# Patient Record
Sex: Female | Born: 1955 | Race: Black or African American | Hispanic: No | Smoking: Never smoker
Health system: Southern US, Community
[De-identification: ages and names within clinical notes are randomized; demographics above are authoritative.]

## PROBLEM LIST (undated history)

## (undated) DIAGNOSIS — M549 Dorsalgia, unspecified: Secondary | ICD-10-CM

## (undated) DIAGNOSIS — K219 Gastro-esophageal reflux disease without esophagitis: Secondary | ICD-10-CM

## (undated) DIAGNOSIS — I669 Occlusion and stenosis of unspecified cerebral artery: Secondary | ICD-10-CM

## (undated) DIAGNOSIS — I639 Cerebral infarction, unspecified: Secondary | ICD-10-CM

## (undated) DIAGNOSIS — N186 End stage renal disease: Secondary | ICD-10-CM

## (undated) DIAGNOSIS — Z992 Dependence on renal dialysis: Secondary | ICD-10-CM

## (undated) DIAGNOSIS — T4145XA Adverse effect of unspecified anesthetic, initial encounter: Secondary | ICD-10-CM

## (undated) DIAGNOSIS — Z9049 Acquired absence of other specified parts of digestive tract: Secondary | ICD-10-CM

## (undated) DIAGNOSIS — I359 Nonrheumatic aortic valve disorder, unspecified: Secondary | ICD-10-CM

## (undated) DIAGNOSIS — M79601 Pain in right arm: Secondary | ICD-10-CM

## (undated) DIAGNOSIS — M109 Gout, unspecified: Secondary | ICD-10-CM

## (undated) DIAGNOSIS — T8859XA Other complications of anesthesia, initial encounter: Secondary | ICD-10-CM

## (undated) DIAGNOSIS — Z9289 Personal history of other medical treatment: Secondary | ICD-10-CM

## (undated) DIAGNOSIS — D649 Anemia, unspecified: Secondary | ICD-10-CM

## (undated) DIAGNOSIS — M1990 Unspecified osteoarthritis, unspecified site: Secondary | ICD-10-CM

## (undated) DIAGNOSIS — Z952 Presence of prosthetic heart valve: Secondary | ICD-10-CM

## (undated) DIAGNOSIS — N289 Disorder of kidney and ureter, unspecified: Secondary | ICD-10-CM

## (undated) DIAGNOSIS — G459 Transient cerebral ischemic attack, unspecified: Secondary | ICD-10-CM

## (undated) DIAGNOSIS — K8020 Calculus of gallbladder without cholecystitis without obstruction: Secondary | ICD-10-CM

## (undated) DIAGNOSIS — I38 Endocarditis, valve unspecified: Secondary | ICD-10-CM

## (undated) DIAGNOSIS — M542 Cervicalgia: Secondary | ICD-10-CM

## (undated) DIAGNOSIS — R102 Pelvic and perineal pain: Secondary | ICD-10-CM

## (undated) DIAGNOSIS — G5600 Carpal tunnel syndrome, unspecified upper limb: Secondary | ICD-10-CM

## (undated) DIAGNOSIS — R6521 Severe sepsis with septic shock: Secondary | ICD-10-CM

## (undated) DIAGNOSIS — R9389 Abnormal findings on diagnostic imaging of other specified body structures: Secondary | ICD-10-CM

## (undated) DIAGNOSIS — R7881 Bacteremia: Secondary | ICD-10-CM

## (undated) DIAGNOSIS — G8929 Other chronic pain: Secondary | ICD-10-CM

## (undated) DIAGNOSIS — M810 Age-related osteoporosis without current pathological fracture: Secondary | ICD-10-CM

## (undated) DIAGNOSIS — M7989 Other specified soft tissue disorders: Secondary | ICD-10-CM

## (undated) DIAGNOSIS — B957 Other staphylococcus as the cause of diseases classified elsewhere: Secondary | ICD-10-CM

## (undated) DIAGNOSIS — I272 Pulmonary hypertension, unspecified: Secondary | ICD-10-CM

## (undated) DIAGNOSIS — A419 Sepsis, unspecified organism: Secondary | ICD-10-CM

## (undated) DIAGNOSIS — B9561 Methicillin susceptible Staphylococcus aureus infection as the cause of diseases classified elsewhere: Secondary | ICD-10-CM

## (undated) DIAGNOSIS — T829XXA Unspecified complication of cardiac and vascular prosthetic device, implant and graft, initial encounter: Secondary | ICD-10-CM

## (undated) HISTORY — PX: COLONOSCOPY W/ POLYPECTOMY: SHX1380

## (undated) HISTORY — PX: CARDIAC VALVE REPLACEMENT: SHX585

## (undated) HISTORY — DX: Other specified soft tissue disorders: M79.89

## (undated) HISTORY — DX: Abnormal findings on diagnostic imaging of other specified body structures: R93.89

## (undated) HISTORY — DX: Presence of prosthetic heart valve: Z95.2

## (undated) HISTORY — DX: Gastro-esophageal reflux disease without esophagitis: K21.9

## (undated) HISTORY — PX: DG AV DIALYSIS GRAFT DECLOT OR: HXRAD813

## (undated) HISTORY — DX: Unspecified complication of cardiac and vascular prosthetic device, implant and graft, initial encounter: T82.9XXA

## (undated) HISTORY — DX: Nonrheumatic aortic valve disorder, unspecified: I35.9

## (undated) HISTORY — DX: Pulmonary hypertension, unspecified: I27.20

## (undated) HISTORY — DX: Pain in right arm: M79.601

## (undated) HISTORY — DX: Bacteremia: R78.81

## (undated) HISTORY — DX: Other staphylococcus as the cause of diseases classified elsewhere: B95.7

## (undated) HISTORY — DX: Cerebral infarction, unspecified: I63.9

## (undated) HISTORY — DX: Occlusion and stenosis of unspecified cerebral artery: I66.9

## (undated) HISTORY — DX: Cervicalgia: M54.2

## (undated) HISTORY — DX: Dorsalgia, unspecified: M54.9

## (undated) HISTORY — DX: Calculus of gallbladder without cholecystitis without obstruction: K80.20

## (undated) HISTORY — DX: Other chronic pain: G89.29

## (undated) HISTORY — DX: Pelvic and perineal pain: R10.2

## (undated) HISTORY — DX: Endocarditis, valve unspecified: I38

## (undated) HISTORY — DX: Age-related osteoporosis without current pathological fracture: M81.0

## (undated) HISTORY — DX: Acquired absence of other specified parts of digestive tract: Z90.49

---

## 1981-02-26 HISTORY — PX: TUBAL LIGATION: SHX77

## 1997-05-27 ENCOUNTER — Encounter (HOSPITAL_COMMUNITY): Admission: RE | Admit: 1997-05-27 | Discharge: 1997-08-25 | Payer: Self-pay | Admitting: Nephrology

## 1997-08-31 ENCOUNTER — Encounter (HOSPITAL_COMMUNITY): Admission: RE | Admit: 1997-08-31 | Discharge: 1997-11-29 | Payer: Self-pay | Admitting: Nephrology

## 1997-09-01 ENCOUNTER — Other Ambulatory Visit: Admission: RE | Admit: 1997-09-01 | Discharge: 1997-09-01 | Payer: Self-pay | Admitting: Nephrology

## 1997-09-10 ENCOUNTER — Emergency Department (HOSPITAL_COMMUNITY): Admission: EM | Admit: 1997-09-10 | Discharge: 1997-09-11 | Payer: Self-pay | Admitting: Emergency Medicine

## 1997-11-26 ENCOUNTER — Encounter (HOSPITAL_COMMUNITY): Admission: RE | Admit: 1997-11-26 | Discharge: 1998-02-24 | Payer: Self-pay | Admitting: Nephrology

## 1998-05-02 ENCOUNTER — Encounter (HOSPITAL_COMMUNITY): Admission: RE | Admit: 1998-05-02 | Discharge: 1998-07-31 | Payer: Self-pay | Admitting: Nephrology

## 1998-08-11 ENCOUNTER — Encounter (HOSPITAL_COMMUNITY): Admission: RE | Admit: 1998-08-11 | Discharge: 1998-11-09 | Payer: Self-pay | Admitting: Nephrology

## 1998-11-11 ENCOUNTER — Encounter (HOSPITAL_COMMUNITY): Admission: RE | Admit: 1998-11-11 | Discharge: 1999-02-09 | Payer: Self-pay | Admitting: Nephrology

## 1999-01-11 ENCOUNTER — Ambulatory Visit: Admission: RE | Admit: 1999-01-11 | Discharge: 1999-01-11 | Payer: Self-pay | Admitting: Vascular Surgery

## 1999-01-11 ENCOUNTER — Encounter: Payer: Self-pay | Admitting: Vascular Surgery

## 1999-02-16 ENCOUNTER — Ambulatory Visit (HOSPITAL_COMMUNITY): Admission: RE | Admit: 1999-02-16 | Discharge: 1999-02-16 | Payer: Self-pay | Admitting: Thoracic Surgery

## 1999-02-22 ENCOUNTER — Ambulatory Visit (HOSPITAL_COMMUNITY): Admission: RE | Admit: 1999-02-22 | Discharge: 1999-02-22 | Payer: Self-pay | Admitting: Vascular Surgery

## 1999-03-06 ENCOUNTER — Ambulatory Visit (HOSPITAL_COMMUNITY): Admission: RE | Admit: 1999-03-06 | Discharge: 1999-03-06 | Payer: Self-pay | Admitting: Nephrology

## 1999-03-06 ENCOUNTER — Encounter: Payer: Self-pay | Admitting: Nephrology

## 1999-03-22 ENCOUNTER — Ambulatory Visit (HOSPITAL_COMMUNITY): Admission: RE | Admit: 1999-03-22 | Discharge: 1999-03-22 | Payer: Self-pay | Admitting: Nephrology

## 1999-05-08 ENCOUNTER — Ambulatory Visit (HOSPITAL_COMMUNITY): Admission: RE | Admit: 1999-05-08 | Discharge: 1999-05-08 | Payer: Self-pay | Admitting: Nephrology

## 1999-05-13 ENCOUNTER — Encounter: Payer: Self-pay | Admitting: Vascular Surgery

## 1999-05-13 ENCOUNTER — Ambulatory Visit (HOSPITAL_COMMUNITY): Admission: RE | Admit: 1999-05-13 | Discharge: 1999-05-13 | Payer: Self-pay | Admitting: Vascular Surgery

## 1999-06-14 ENCOUNTER — Ambulatory Visit (HOSPITAL_COMMUNITY): Admission: RE | Admit: 1999-06-14 | Discharge: 1999-06-14 | Payer: Self-pay | Admitting: *Deleted

## 1999-06-23 ENCOUNTER — Ambulatory Visit (HOSPITAL_COMMUNITY): Admission: RE | Admit: 1999-06-23 | Discharge: 1999-06-23 | Payer: Self-pay | Admitting: *Deleted

## 1999-07-04 ENCOUNTER — Encounter: Payer: Self-pay | Admitting: Vascular Surgery

## 1999-07-04 ENCOUNTER — Ambulatory Visit (HOSPITAL_COMMUNITY): Admission: RE | Admit: 1999-07-04 | Discharge: 1999-07-04 | Payer: Self-pay | Admitting: Vascular Surgery

## 1999-07-11 ENCOUNTER — Other Ambulatory Visit: Admission: RE | Admit: 1999-07-11 | Discharge: 1999-07-11 | Payer: Self-pay | Admitting: Obstetrics

## 1999-07-11 ENCOUNTER — Encounter: Admission: RE | Admit: 1999-07-11 | Discharge: 1999-07-11 | Payer: Self-pay | Admitting: Obstetrics & Gynecology

## 1999-07-19 ENCOUNTER — Ambulatory Visit (HOSPITAL_COMMUNITY): Admission: RE | Admit: 1999-07-19 | Discharge: 1999-07-19 | Payer: Self-pay | Admitting: Obstetrics

## 1999-08-01 ENCOUNTER — Encounter: Admission: RE | Admit: 1999-08-01 | Discharge: 1999-08-01 | Payer: Self-pay | Admitting: Obstetrics

## 2000-04-09 ENCOUNTER — Encounter: Admission: RE | Admit: 2000-04-09 | Discharge: 2000-04-09 | Payer: Self-pay | Admitting: *Deleted

## 2000-04-16 ENCOUNTER — Ambulatory Visit (HOSPITAL_COMMUNITY): Admission: RE | Admit: 2000-04-16 | Discharge: 2000-04-16 | Payer: Self-pay | Admitting: *Deleted

## 2000-08-02 ENCOUNTER — Encounter: Payer: Self-pay | Admitting: Nephrology

## 2000-08-02 ENCOUNTER — Encounter: Admission: RE | Admit: 2000-08-02 | Discharge: 2000-08-02 | Payer: Self-pay | Admitting: Nephrology

## 2000-08-14 ENCOUNTER — Ambulatory Visit: Admission: RE | Admit: 2000-08-14 | Discharge: 2000-08-14 | Payer: Self-pay | Admitting: Nephrology

## 2000-08-14 ENCOUNTER — Encounter: Payer: Self-pay | Admitting: Nephrology

## 2001-01-10 ENCOUNTER — Ambulatory Visit (HOSPITAL_COMMUNITY): Admission: RE | Admit: 2001-01-10 | Discharge: 2001-01-10 | Payer: Self-pay | Admitting: Vascular Surgery

## 2001-01-10 ENCOUNTER — Encounter: Payer: Self-pay | Admitting: Vascular Surgery

## 2001-02-14 ENCOUNTER — Ambulatory Visit (HOSPITAL_COMMUNITY): Admission: RE | Admit: 2001-02-14 | Discharge: 2001-02-14 | Payer: Self-pay | Admitting: Vascular Surgery

## 2001-04-29 ENCOUNTER — Encounter: Payer: Self-pay | Admitting: *Deleted

## 2001-04-29 ENCOUNTER — Ambulatory Visit (HOSPITAL_COMMUNITY): Admission: RE | Admit: 2001-04-29 | Discharge: 2001-04-29 | Payer: Self-pay | Admitting: *Deleted

## 2001-08-06 ENCOUNTER — Ambulatory Visit (HOSPITAL_COMMUNITY): Admission: RE | Admit: 2001-08-06 | Discharge: 2001-08-06 | Payer: Self-pay | Admitting: *Deleted

## 2001-11-03 ENCOUNTER — Ambulatory Visit (HOSPITAL_COMMUNITY): Admission: RE | Admit: 2001-11-03 | Discharge: 2001-11-03 | Payer: Self-pay | Admitting: Vascular Surgery

## 2001-12-02 ENCOUNTER — Encounter: Payer: Self-pay | Admitting: Nephrology

## 2001-12-02 ENCOUNTER — Encounter: Admission: RE | Admit: 2001-12-02 | Discharge: 2001-12-02 | Payer: Self-pay | Admitting: Nephrology

## 2001-12-18 DIAGNOSIS — D631 Anemia in chronic kidney disease: Secondary | ICD-10-CM | POA: Insufficient documentation

## 2002-02-22 ENCOUNTER — Emergency Department (HOSPITAL_COMMUNITY): Admission: EM | Admit: 2002-02-22 | Discharge: 2002-02-22 | Payer: Self-pay | Admitting: Emergency Medicine

## 2002-02-22 ENCOUNTER — Encounter: Payer: Self-pay | Admitting: Emergency Medicine

## 2002-03-18 ENCOUNTER — Encounter: Payer: Self-pay | Admitting: Nephrology

## 2002-03-18 ENCOUNTER — Ambulatory Visit (HOSPITAL_COMMUNITY): Admission: RE | Admit: 2002-03-18 | Discharge: 2002-03-18 | Payer: Self-pay | Admitting: Nephrology

## 2002-04-03 ENCOUNTER — Ambulatory Visit (HOSPITAL_COMMUNITY): Admission: RE | Admit: 2002-04-03 | Discharge: 2002-04-03 | Payer: Self-pay | Admitting: *Deleted

## 2002-04-03 ENCOUNTER — Encounter: Payer: Self-pay | Admitting: *Deleted

## 2002-05-01 ENCOUNTER — Ambulatory Visit (HOSPITAL_COMMUNITY): Admission: RE | Admit: 2002-05-01 | Discharge: 2002-05-01 | Payer: Self-pay | Admitting: Vascular Surgery

## 2002-05-01 ENCOUNTER — Encounter: Payer: Self-pay | Admitting: Vascular Surgery

## 2002-06-10 ENCOUNTER — Ambulatory Visit (HOSPITAL_COMMUNITY): Admission: RE | Admit: 2002-06-10 | Discharge: 2002-06-10 | Payer: Self-pay | Admitting: Vascular Surgery

## 2002-10-14 ENCOUNTER — Ambulatory Visit (HOSPITAL_COMMUNITY): Admission: RE | Admit: 2002-10-14 | Discharge: 2002-10-14 | Payer: Self-pay | Admitting: Nephrology

## 2002-12-28 ENCOUNTER — Ambulatory Visit (HOSPITAL_COMMUNITY): Admission: RE | Admit: 2002-12-28 | Discharge: 2002-12-28 | Payer: Self-pay | Admitting: *Deleted

## 2003-05-20 ENCOUNTER — Emergency Department (HOSPITAL_COMMUNITY): Admission: EM | Admit: 2003-05-20 | Discharge: 2003-05-20 | Payer: Self-pay | Admitting: Emergency Medicine

## 2003-07-01 ENCOUNTER — Encounter (INDEPENDENT_AMBULATORY_CARE_PROVIDER_SITE_OTHER): Payer: Self-pay | Admitting: *Deleted

## 2003-07-01 LAB — CONVERTED CEMR LAB

## 2003-07-08 ENCOUNTER — Encounter: Admission: RE | Admit: 2003-07-08 | Discharge: 2003-07-08 | Payer: Self-pay | Admitting: Sports Medicine

## 2003-07-08 ENCOUNTER — Encounter (INDEPENDENT_AMBULATORY_CARE_PROVIDER_SITE_OTHER): Payer: Self-pay | Admitting: Specialist

## 2003-07-12 ENCOUNTER — Ambulatory Visit (HOSPITAL_COMMUNITY): Admission: RE | Admit: 2003-07-12 | Discharge: 2003-07-12 | Payer: Self-pay | Admitting: Family Medicine

## 2003-09-02 ENCOUNTER — Emergency Department (HOSPITAL_COMMUNITY): Admission: EM | Admit: 2003-09-02 | Discharge: 2003-09-02 | Payer: Self-pay | Admitting: Emergency Medicine

## 2003-11-03 ENCOUNTER — Encounter: Admission: RE | Admit: 2003-11-03 | Discharge: 2003-11-03 | Payer: Self-pay | Admitting: Nephrology

## 2003-11-17 ENCOUNTER — Encounter: Admission: RE | Admit: 2003-11-17 | Discharge: 2003-11-17 | Payer: Self-pay | Admitting: Nephrology

## 2003-11-19 ENCOUNTER — Encounter: Admission: RE | Admit: 2003-11-19 | Discharge: 2003-11-19 | Payer: Self-pay | Admitting: Nephrology

## 2003-11-20 ENCOUNTER — Ambulatory Visit (HOSPITAL_COMMUNITY): Admission: AD | Admit: 2003-11-20 | Discharge: 2003-11-20 | Payer: Self-pay | Admitting: Vascular Surgery

## 2004-01-26 ENCOUNTER — Ambulatory Visit (HOSPITAL_COMMUNITY): Admission: RE | Admit: 2004-01-26 | Discharge: 2004-01-26 | Payer: Self-pay | Admitting: *Deleted

## 2004-01-26 ENCOUNTER — Encounter (INDEPENDENT_AMBULATORY_CARE_PROVIDER_SITE_OTHER): Payer: Self-pay | Admitting: *Deleted

## 2004-02-04 ENCOUNTER — Ambulatory Visit (HOSPITAL_COMMUNITY): Admission: RE | Admit: 2004-02-04 | Discharge: 2004-02-04 | Payer: Self-pay | Admitting: Vascular Surgery

## 2004-06-09 ENCOUNTER — Ambulatory Visit (HOSPITAL_COMMUNITY): Admission: RE | Admit: 2004-06-09 | Discharge: 2004-06-09 | Payer: Self-pay | Admitting: Nephrology

## 2004-08-18 ENCOUNTER — Ambulatory Visit (HOSPITAL_COMMUNITY): Admission: RE | Admit: 2004-08-18 | Discharge: 2004-08-18 | Payer: Self-pay | Admitting: Internal Medicine

## 2004-09-25 ENCOUNTER — Ambulatory Visit (HOSPITAL_COMMUNITY): Admission: RE | Admit: 2004-09-25 | Discharge: 2004-09-25 | Payer: Self-pay | Admitting: Vascular Surgery

## 2004-09-26 ENCOUNTER — Ambulatory Visit (HOSPITAL_COMMUNITY): Admission: RE | Admit: 2004-09-26 | Discharge: 2004-09-26 | Payer: Self-pay | Admitting: *Deleted

## 2004-10-23 ENCOUNTER — Ambulatory Visit (HOSPITAL_COMMUNITY): Admission: RE | Admit: 2004-10-23 | Discharge: 2004-10-23 | Payer: Self-pay | Admitting: Vascular Surgery

## 2004-11-27 ENCOUNTER — Ambulatory Visit (HOSPITAL_COMMUNITY): Admission: RE | Admit: 2004-11-27 | Discharge: 2004-11-27 | Payer: Self-pay | Admitting: Vascular Surgery

## 2004-11-28 ENCOUNTER — Inpatient Hospital Stay (HOSPITAL_COMMUNITY): Admission: EM | Admit: 2004-11-28 | Discharge: 2004-12-02 | Payer: Self-pay | Admitting: Emergency Medicine

## 2004-12-26 ENCOUNTER — Emergency Department (HOSPITAL_COMMUNITY): Admission: EM | Admit: 2004-12-26 | Discharge: 2004-12-26 | Payer: Self-pay | Admitting: Emergency Medicine

## 2005-02-07 ENCOUNTER — Ambulatory Visit: Admission: RE | Admit: 2005-02-07 | Discharge: 2005-02-07 | Payer: Self-pay | Admitting: Vascular Surgery

## 2005-04-02 ENCOUNTER — Ambulatory Visit (HOSPITAL_COMMUNITY): Admission: RE | Admit: 2005-04-02 | Discharge: 2005-04-02 | Payer: Self-pay | Admitting: Vascular Surgery

## 2005-04-04 ENCOUNTER — Ambulatory Visit (HOSPITAL_COMMUNITY): Admission: RE | Admit: 2005-04-04 | Discharge: 2005-04-04 | Payer: Self-pay | Admitting: Vascular Surgery

## 2005-04-25 ENCOUNTER — Ambulatory Visit (HOSPITAL_COMMUNITY): Admission: RE | Admit: 2005-04-25 | Discharge: 2005-04-25 | Payer: Self-pay | Admitting: *Deleted

## 2005-05-07 ENCOUNTER — Ambulatory Visit (HOSPITAL_COMMUNITY): Admission: RE | Admit: 2005-05-07 | Discharge: 2005-05-07 | Payer: Self-pay | Admitting: Nephrology

## 2005-05-17 ENCOUNTER — Ambulatory Visit (HOSPITAL_COMMUNITY): Admission: RE | Admit: 2005-05-17 | Discharge: 2005-05-17 | Payer: Self-pay | Admitting: Vascular Surgery

## 2005-06-07 ENCOUNTER — Ambulatory Visit (HOSPITAL_COMMUNITY): Admission: RE | Admit: 2005-06-07 | Discharge: 2005-06-07 | Payer: Self-pay | Admitting: Critical Care Medicine

## 2005-08-06 ENCOUNTER — Ambulatory Visit (HOSPITAL_COMMUNITY): Admission: RE | Admit: 2005-08-06 | Discharge: 2005-08-06 | Payer: Self-pay | Admitting: Nephrology

## 2005-08-23 ENCOUNTER — Encounter: Admission: RE | Admit: 2005-08-23 | Discharge: 2005-08-23 | Payer: Self-pay | Admitting: Nephrology

## 2005-09-05 ENCOUNTER — Encounter: Admission: RE | Admit: 2005-09-05 | Discharge: 2005-10-03 | Payer: Self-pay | Admitting: Nephrology

## 2006-03-07 ENCOUNTER — Encounter: Admission: RE | Admit: 2006-03-07 | Discharge: 2006-03-07 | Payer: Self-pay | Admitting: Nephrology

## 2006-03-27 ENCOUNTER — Inpatient Hospital Stay (HOSPITAL_COMMUNITY): Admission: RE | Admit: 2006-03-27 | Discharge: 2006-03-28 | Payer: Self-pay | Admitting: Vascular Surgery

## 2006-04-10 ENCOUNTER — Ambulatory Visit: Payer: Self-pay | Admitting: Vascular Surgery

## 2006-04-25 DIAGNOSIS — N039 Chronic nephritic syndrome with unspecified morphologic changes: Secondary | ICD-10-CM | POA: Insufficient documentation

## 2006-04-25 DIAGNOSIS — N19 Unspecified kidney failure: Secondary | ICD-10-CM | POA: Insufficient documentation

## 2006-04-26 ENCOUNTER — Encounter (INDEPENDENT_AMBULATORY_CARE_PROVIDER_SITE_OTHER): Payer: Self-pay | Admitting: *Deleted

## 2006-07-01 ENCOUNTER — Ambulatory Visit: Payer: Self-pay | Admitting: Sports Medicine

## 2006-07-01 ENCOUNTER — Encounter (INDEPENDENT_AMBULATORY_CARE_PROVIDER_SITE_OTHER): Payer: Self-pay | Admitting: Specialist

## 2006-07-01 DIAGNOSIS — N186 End stage renal disease: Secondary | ICD-10-CM | POA: Insufficient documentation

## 2006-07-01 DIAGNOSIS — Z992 Dependence on renal dialysis: Secondary | ICD-10-CM

## 2006-07-01 LAB — CONVERTED CEMR LAB

## 2006-09-06 ENCOUNTER — Ambulatory Visit (HOSPITAL_COMMUNITY): Admission: RE | Admit: 2006-09-06 | Discharge: 2006-09-06 | Payer: Self-pay | Admitting: Nephrology

## 2007-05-05 ENCOUNTER — Ambulatory Visit (HOSPITAL_COMMUNITY): Admission: RE | Admit: 2007-05-05 | Discharge: 2007-05-05 | Payer: Self-pay | Admitting: Nephrology

## 2007-07-09 ENCOUNTER — Encounter: Admission: RE | Admit: 2007-07-09 | Discharge: 2007-07-09 | Payer: Self-pay | Admitting: Nephrology

## 2007-10-20 ENCOUNTER — Ambulatory Visit (HOSPITAL_COMMUNITY): Admission: RE | Admit: 2007-10-20 | Discharge: 2007-10-20 | Payer: Self-pay

## 2007-12-04 ENCOUNTER — Emergency Department (HOSPITAL_COMMUNITY): Admission: EM | Admit: 2007-12-04 | Discharge: 2007-12-04 | Payer: Self-pay | Admitting: Emergency Medicine

## 2008-02-06 ENCOUNTER — Encounter: Admission: RE | Admit: 2008-02-06 | Discharge: 2008-02-06 | Payer: Self-pay | Admitting: Nephrology

## 2008-02-13 ENCOUNTER — Ambulatory Visit: Payer: Self-pay | Admitting: Vascular Surgery

## 2008-02-13 ENCOUNTER — Ambulatory Visit (HOSPITAL_COMMUNITY): Admission: RE | Admit: 2008-02-13 | Discharge: 2008-02-13 | Payer: Self-pay | Admitting: Vascular Surgery

## 2008-02-16 ENCOUNTER — Ambulatory Visit (HOSPITAL_COMMUNITY): Admission: RE | Admit: 2008-02-16 | Discharge: 2008-02-16 | Payer: Self-pay | Admitting: Vascular Surgery

## 2008-03-22 DIAGNOSIS — K5730 Diverticulosis of large intestine without perforation or abscess without bleeding: Secondary | ICD-10-CM | POA: Insufficient documentation

## 2008-07-12 ENCOUNTER — Ambulatory Visit: Payer: Self-pay | Admitting: Vascular Surgery

## 2008-07-14 ENCOUNTER — Ambulatory Visit (HOSPITAL_COMMUNITY): Admission: RE | Admit: 2008-07-14 | Discharge: 2008-07-14 | Payer: Self-pay | Admitting: Vascular Surgery

## 2008-11-10 ENCOUNTER — Encounter: Admission: RE | Admit: 2008-11-10 | Discharge: 2008-11-10 | Payer: Self-pay | Admitting: Interventional Radiology

## 2008-11-12 ENCOUNTER — Ambulatory Visit (HOSPITAL_COMMUNITY): Admission: RE | Admit: 2008-11-12 | Discharge: 2008-11-12 | Payer: Self-pay | Admitting: Family Medicine

## 2008-11-15 ENCOUNTER — Ambulatory Visit (HOSPITAL_COMMUNITY): Admission: RE | Admit: 2008-11-15 | Discharge: 2008-11-15 | Payer: Self-pay | Admitting: Interventional Radiology

## 2008-11-15 ENCOUNTER — Encounter: Payer: Self-pay | Admitting: Family Medicine

## 2008-11-18 ENCOUNTER — Encounter: Admission: RE | Admit: 2008-11-18 | Discharge: 2008-11-18 | Payer: Self-pay | Admitting: Family Medicine

## 2008-11-23 ENCOUNTER — Encounter: Payer: Self-pay | Admitting: Family Medicine

## 2008-11-24 ENCOUNTER — Encounter: Payer: Self-pay | Admitting: Family Medicine

## 2008-11-24 ENCOUNTER — Ambulatory Visit: Payer: Self-pay | Admitting: Family Medicine

## 2008-11-24 DIAGNOSIS — D259 Leiomyoma of uterus, unspecified: Secondary | ICD-10-CM | POA: Insufficient documentation

## 2008-12-02 ENCOUNTER — Encounter: Payer: Self-pay | Admitting: Family Medicine

## 2008-12-06 ENCOUNTER — Encounter: Admission: RE | Admit: 2008-12-06 | Discharge: 2008-12-06 | Payer: Self-pay | Admitting: Family Medicine

## 2008-12-06 ENCOUNTER — Ambulatory Visit: Payer: Self-pay | Admitting: Family Medicine

## 2008-12-06 DIAGNOSIS — M109 Gout, unspecified: Secondary | ICD-10-CM | POA: Insufficient documentation

## 2008-12-06 DIAGNOSIS — M549 Dorsalgia, unspecified: Secondary | ICD-10-CM

## 2008-12-06 DIAGNOSIS — K219 Gastro-esophageal reflux disease without esophagitis: Secondary | ICD-10-CM

## 2008-12-06 HISTORY — DX: Dorsalgia, unspecified: M54.9

## 2008-12-06 HISTORY — DX: Gastro-esophageal reflux disease without esophagitis: K21.9

## 2009-01-19 ENCOUNTER — Ambulatory Visit: Payer: Self-pay | Admitting: Family Medicine

## 2009-02-26 HISTORY — PX: BREAST BIOPSY: SHX20

## 2009-03-28 ENCOUNTER — Encounter: Payer: Self-pay | Admitting: Family Medicine

## 2009-06-10 ENCOUNTER — Encounter: Admission: RE | Admit: 2009-06-10 | Discharge: 2009-06-10 | Payer: Self-pay | Admitting: Nephrology

## 2009-06-15 ENCOUNTER — Encounter: Admission: RE | Admit: 2009-06-15 | Discharge: 2009-06-15 | Payer: Self-pay | Admitting: Family Medicine

## 2009-06-15 ENCOUNTER — Encounter: Payer: Self-pay | Admitting: Family Medicine

## 2009-08-19 ENCOUNTER — Ambulatory Visit (HOSPITAL_COMMUNITY): Admission: RE | Admit: 2009-08-19 | Discharge: 2009-08-19 | Payer: Self-pay | Admitting: Nephrology

## 2009-10-04 ENCOUNTER — Emergency Department (HOSPITAL_COMMUNITY): Admission: EM | Admit: 2009-10-04 | Discharge: 2009-10-04 | Payer: Self-pay | Admitting: Emergency Medicine

## 2009-10-04 ENCOUNTER — Emergency Department (HOSPITAL_COMMUNITY): Admission: EM | Admit: 2009-10-04 | Discharge: 2009-10-04 | Payer: Self-pay | Admitting: Family Medicine

## 2009-11-14 ENCOUNTER — Encounter: Admission: RE | Admit: 2009-11-14 | Discharge: 2009-11-14 | Payer: Self-pay | Admitting: Gynecology

## 2010-01-16 ENCOUNTER — Encounter: Admission: RE | Admit: 2010-01-16 | Discharge: 2010-01-16 | Payer: Self-pay | Admitting: Nephrology

## 2010-01-31 ENCOUNTER — Encounter: Payer: Self-pay | Admitting: Family Medicine

## 2010-03-18 ENCOUNTER — Encounter: Payer: Self-pay | Admitting: Nephrology

## 2010-03-28 NOTE — Consult Note (Signed)
Summary: [place]  [place]   Imported By: Raymond Gurney 04/05/2009 15:52:36  _____________________________________________________________________  External Attachment:    Type:   Image     Comment:   External Document

## 2010-03-28 NOTE — Miscellaneous (Signed)
  Clinical Lists Changes  Problems: Removed problem of ABDOMINAL PAIN OTHER SPECIFIED SITE (ICD-789.09) Removed problem of ANKLE PAIN, LEFT (ICD-719.47) Removed problem of ENDOMETRIAL BIOPSY, HX OF (ICD-V45.89) Removed problem of SCREENING FOR MALIGNANT NEOPLASM OF THE CERVIX (ICD-V76.2) Removed problem of SCREENING FOR MALIGNANCY NOS (ICD-V76.9) Removed problem of VAGINITIS NOS (ICD-616.10)      Allergies: No Known Drug Allergies

## 2010-04-04 ENCOUNTER — Encounter: Payer: Self-pay | Admitting: *Deleted

## 2010-05-12 LAB — POCT I-STAT, CHEM 8
Calcium, Ion: 1.02 mmol/L — ABNORMAL LOW (ref 1.12–1.32)
Glucose, Bld: 129 mg/dL — ABNORMAL HIGH (ref 70–99)
Potassium: 4.1 mEq/L (ref 3.5–5.1)
Sodium: 140 mEq/L (ref 135–145)

## 2010-06-18 ENCOUNTER — Inpatient Hospital Stay (INDEPENDENT_AMBULATORY_CARE_PROVIDER_SITE_OTHER)
Admission: RE | Admit: 2010-06-18 | Discharge: 2010-06-18 | Disposition: A | Discharge status: Home / Self Care | Payer: Medicare Other | Source: Ambulatory Visit | Attending: Emergency Medicine | Admitting: Emergency Medicine

## 2010-06-18 DIAGNOSIS — J069 Acute upper respiratory infection, unspecified: Secondary | ICD-10-CM

## 2010-06-18 LAB — POCT RAPID STREP A (OFFICE): Streptococcus, Group A Screen (Direct): NEGATIVE

## 2010-07-11 NOTE — Op Note (Signed)
NAME:  [name] Marks, [name] Marks               ACCOUNT NO.:  [id number]   MEDICAL RECORD NO.:  [id number]          PATIENT TYPE:  AMB   LOCATION:  SDS                          FACILITY:  [facility]   PHYSICIAN:  Rosetta Posner, M.D.    DATE OF BIRTH:  October 18, 1955   DATE OF PROCEDURE:  DATE OF DISCHARGE:  02/16/2008                               OPERATIVE REPORT   PREOPERATIVE DIAGNOSIS:  End-stage renal disease.   POSTOPERATIVE DIAGNOSIS:  End-stage renal disease.   PROCEDURE:  Ultrasound visualization of jugular veins followed by  placement of left internal jugular Diatek catheter.   SURGEON:  Rosetta Posner, MD   ASSISTANT:  Nurse.   ANESTHESIA:  MAC.   COMPLICATIONS:  None.   DISPOSITION:  Recovery room, stable.   PROCEDURE IN DETAIL:  The patient was taken to the operating room and  placed in the supine position where the area of both right and left neck  were imaged with ultrasound.  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  The left internal jugular vein was of good size.  The patient had had an infected left IJ catheter removed several days  ago.  The exit site had no evidence of infection.  The patient was  placed in the Trendelenburg position and the right and left neck and  chest were prepped and draped in the usual sterile fashion.  Using local  anesthesia and a finder needle, the left internal jugular vein was  accessed and a guidewire was passed down to the level of the innominate  vein.  The catheter tracked up the right innominate and therefore an end-  hole catheter was passed over the guidewire and this was used to direct  the catheter into the right atrium.  Dilator and peel-away sheath was  passed over the guidewire and the dilator was removed.  The 32-cm Diatek  catheter was passed over the guidewire and was positioned down into the  right atrium.  The guidewire and the peel-away sheath were removed.  The  catheter was brought through a subcutaneous  tunnel through a separate  stab incision.  The two lumen ports were attached and both lumens were  flushed and aspirated easily and were locked with 1000 units/mL heparin.  The catheter was secured to the skin with 3-0 nylon stitch and entry  site was closed with 4-0 subcuticular Vicryl stitch.  Sterile dressing  was applied.  The patient was taken to the recovery room in stable  condition where a chest x-ray was obtained.   The patient has no further option of her upper extremities for long-term  dialysis.  It has been recommended that she have placement of a femoral  graft, which she has refused.  The patient will continue the  hemodialysis via her left IJ catheter.      Rosetta Posner, M.D.  Electronically Signed     TFE/MEDQ  D:  02/16/2008  T:  02/16/2008  Job:  [id number]

## 2010-07-14 NOTE — Op Note (Signed)
NAME:  [name] Marks, [name] Marks               ACCOUNT NO.:  [id number]   MEDICAL RECORD NO.:  [id number]          PATIENT TYPE:  OIB   LOCATION:  [id number]                         FACILITY:  [facility]   PHYSICIAN:  Dorothea Glassman, M.D.    DATE OF BIRTH:  [birth year]   DATE OF PROCEDURE:  09/26/2004  DATE OF DISCHARGE:  09/26/2004                                 OPERATIVE REPORT   SURGEON:  Gordy Clement, M.D.   ASSISTANT:  Jacinta Shoe, P.A.   ANESTHETIC:  Local MAC.   ANESTHESIOLOGIST:  Sharolyn Douglas, M.D.   PREOPERATIVE DIAGNOSES:  1.  End-stage renal failure.  2.  Clotted right upper arm arteriovenous graft.   POSTOPERATIVE DIAGNOSES:  1.  End-stage renal failure.  2.  Clotted right upper arm arteriovenous graft.   PROCEDURES:  1.  Thrombectomy and revision, right upper arm arteriovenous graft.  2.  Intraoperative shuntogram.   OPERATIVE PROCEDURE:  The patient brought to the operating room in stable  condition.  Placed in supine position.  Right arm prepped and draped in a  sterile fashion.  Skin and subcutaneous tissue instilled with 1% Xylocaine  with epinephrine.  A longitudinal skin incision made through the right  axilla.  Subcutaneous sutures were incised.  There was recently-revised  graft present.  This was followed down to the vein.  The vein appeared to be  of good quality and size.  The vein was controlled with a baby Gregory  clamp.  The venous anastomosis was taken down.  This appeared widely patent.  The graft thrombectomized with a 4 Fogarty catheter.  Several passes made.  Excellent inflow obtained.  The patient administered 3000 units heparin  intravenously.  The graft flushed with heparin-saline solution and  controlled.  The venous anastomosis was then refashioned in an end-to-end  manner using running 6-0 Prolene suture.  Clamps were then removed.  Excellent flow was present.   An intraoperative shuntogram obtained with injection of 20 mL of contrast  solution.  This revealed mild degeneration of the graft.  No apparent  stenosis within the graft.   The subcutaneous tissue was then closed in a running 3-0 Vicryl suture in  two layers.  The skin closed with 4-0 Monocryl.  Steri-Strips applied.  The  patient transferred to recovery room in stable condition.  No apparent complications.       PGH/MEDQ  D:  09/26/2004  T:  09/27/2004  Job:  [id number]

## 2010-07-14 NOTE — Op Note (Signed)
[place]  Patient:    [name] Marks, [name] Marks A Visit Number: [id number] MRN: [id number]          Service Type: DSU Location: [facility] [id number] Attending Physician:  Lavonna Monarch Dictated by:   Gordy Clement, M.D. Proc. Date: 04/29/01 Admit Date:  04/29/2001 Discharge Date: 04/29/2001                             Operative Report  PREOPERATIVE DIAGNOSIS: 1. End-stage renal failure. 2. Clotted right forearm loop arteriovenous graft.  POSTOPERATIVE DIAGNOSIS: 1. End-stage renal failure. 2. Clotted right forearm loop arteriovenous graft.  OPERATION PERFORMED: 1. Thrombectomy and revision, right forearm loop arteriovenous graft. 2. Intraoperative shuntogram.  SURGEON:  P. Cameron Sprang, M.D.  ASSISTANT:  Earnstine Regal, P.A.  ANESTHESIA:  Local with MAC.  ANESTHESIOLOGIST:  Ala Dach, M.D.  INDICATIONS FOR PROCEDURE:  This is a 55 year old female with end-stage renal failure being maintained on long term hemodialysis.  She presented to hemodialysis yesterday with a right forearm loop graft occluded.  Brought to the operating room at this time for exploration.  DESCRIPTION OF PROCEDURE:  The patient was brought to the operating room in stable condition.  Placed in supine position.  Right arm prepped and draped in sterile fashion.  Skin and subcutaneous tissues instilled with 1% Xylocaine with epinephrine.  Longitudinal skin incision was made through the scar on the right arm.  Dissection was carried down to expose the venous limb of the graft which was followed down to an end-to-end anastomosis to the basilic vein.  The vein was mobilized proximally and controlled with a Gregory clamps.  The venous anastomosis was taken down where there was pseudointimal narrowing. The graft thrombectomized with a 4 and 5 Fogarty catheter.  Several passes were made and good inflow obtained.  The graft controlled with a fistula clamp.  The vein  revealed some clot which was teased out easily and a #4 Fogarty catheter was passed up the vein with no further return of clot.  The vein flushed with heparin saline solution.  A new segment of 6 mm Gore-Tex was anastomosed end-to-end to the divided graft using running 6-0 Prolene suture.  This was then interposed end-to-end to the vein with running 6-0 Prolene suture.  The clamps were then removed. Excellent flow present.  Intraoperative shuntogram was obtained.  This revealed the graft to be widely patent.  There was some intimal irregularity in the arterial limb of the graft but no major occlusive problem.  The incisions then closed with 3-0 Vicryl suture in two subcutaneous layers. 4-0 Monocryl tied to the skin.  Sterile dressings applied.  The patient was transferred to the recovery room in stable condition.  No apparent complications. Dictated by:   Gordy Clement, M.D. Attending Physician:  Lavonna Monarch DD:  04/29/01 TD:  04/29/01 Job: [id number]

## 2010-07-14 NOTE — Op Note (Signed)
[place]  Patient:    Marks, [name] A Visit Number: [id number] MRN: [id number]          Service Type: DSU Location: [place] [facility] Attending Physician:  Lavonna Monarch Dictated by:   Nelda Severe Kellie Simmering, M.D. Proc. Date: 08/06/01 Admit Date:  08/06/2001 Discharge Date: 08/06/2001                             Operative Report  PREOPERATIVE DIAGNOSIS:  Thrombosed arteriovenous Gore-Tex graft, right arm.  POSTOPERATIVE DIAGNOSIS:  Thrombosed arteriovenous Gore-Tex graft, right arm.  OPERATION:  Thrombectomy, arteriovenous Gore-Tex graft, right arm, with insertion of a new segment of graft in existing graft of brachial vein.  SURGEON:  Nelda Severe. Kellie Simmering, M.D.  FIRST ASSISTANT:  Earnstine Regal, P.A.  ANESTHESIA:  Local.  DESCRIPTION OF PROCEDURE:  The patient was taken to the operating room and placed in the supine position, at which time the right upper extremity was prepped with Betadine scrubbing solution and draped in routine sterile manner. After infiltration with 1% Xylocaine with epinephrine, a longitudinal incision was made through the previous scar in the mid-upper arm where the Gore-Tex graft had been previously revised to the brachial vein.  The Gore-Tex-to-vein anastomosis was dissected free with a 15 blade and 3000 units of heparin given intravenously.  A transverse opening was made in the graft. The graft itself was easily thrombectomized, with adequate inflow being reestablished.  There was some irregularity on the arterial half of the loop in the forearm but there was no obstruction to the Fogarty and there was good inflow present.  The venous anastomosis was mildly narrowed and the vein proximal to this was 5 mm in size.  The previous anastomosis was excised and a new piece of 6-mm graft was anastomosed end-to-end to the old graft and end-to-end to the proximal vein with 6-0 Prolene.  Clamps were then released and there was  an excellent pulse and thrill in the graft and good Doppler flow.  No protamine was given.  The wound was irrigated with saline and closed in layers with Vicryl in a subcuticular fashion with Steri-Strips, sterile dressing applied and the patient taken to the recovery room in satisfactory condition. Dictated by:   Nelda Severe Kellie Simmering, M.D. Attending Physician:  Lavonna Monarch DD:  08/06/01 TD:  08/08/01 Job: [id number]

## 2010-07-14 NOTE — Op Note (Signed)
NAME:  [name] Marks, [name] Marks               ACCOUNT NO.:  [id number]   MEDICAL RECORD NO.:  [id number]          PATIENT TYPE:  OIB   LOCATION:                               FACILITY:  [place]   PHYSICIAN:  Jessy Oto. Fields, MD  DATE OF BIRTH:  1955-09-20   DATE OF PROCEDURE:  11/20/2003  DATE OF DISCHARGE:  11/20/2003                                 OPERATIVE REPORT   PRE-OPERATIVE DIAGNOSIS:  Thrombosed arteriovenous graft.   POST-OPERATIVE DIAGNOSIS:  Thrombosed arteriovenous graft.   PROCEDURE:  Thrombectomy and revision of right upper arm arteriovenous  grafting.   SURGEON:  Jessy Oto. Oneida Alar, M.D.   ANESTHESIA:  Local with IV sedation.   OPERATIVE FINDINGS:  Intimal hyperplastic narrowing of AV graft.   OPERATIVE DETAILS:  After obtaining informed consent, the patient was taken  to the operating room.  The patient was placed in supine position on the  operating room table.  After adequate sedation, the patient's right upper  extremity was prepped and draped in the usual sterile fashion.  Local  anesthesia was infiltrated at a preexisting longitudinal incision, up near  the axilla.  Dissection was carried down through the subcutaneous tissues,  down to the level of the graft.  The graft was dissected free,  circumferentially, and the dissection carried down to the level of the  venous anastomosis.  The previous anastomosis had been placed end-to-side.  The vein was of good quality.  There was some narrowing of the vein, right  at the venous anastomosis.  Next, the patient was given 5000 units of  intravenous heparin.  At the launch area, a transverse aortotomy was made,  just below the level of the venous anastomosis.  A #4 coronary catheter was  passed through the venous anastomosis and up into the central vein, then all  thrombotic material removed until there was good venous backbleeding.  There  was also good venous backbleeding from the distal vein, at this point.  The  anastomosis was probed and found to accept a #3 coronary dilator, but a  tight fit, and a #4 coronary dilator would not pass easily; therefore, the  distal vein was ligated with the 3-0 silk tie, and the anastomosis was taken  down.  There was an area of intimal hyperplastic narrowing.  This was  debrided.  The vein was then spatulated and the vein swung up to the level  of the graft.  The graft was rebeveled and then anastomosed end-to-end to  the axillary vein, using a running 6-0 Prolene suture.  Just prior to  completion of the anastomosis, the vein was backbled, the artery forebled.  After thrombectomizing the arterial limb, an arterialized plug was removed.  There was good arterial inflow.  Anastomosis was secured.  Hemostasis was  obtained.  Clamps were removed.  There was a palpable thrill within the  graft.  Next, the wound was thoroughly irrigated and inspected for  hemostasis.  The subcutaneous tissues were then reapproximated, using  running 3-0 Vicryl suture.  The skin was  then closed with a 4-0 Vicryl subcuticular stitch.  The patient tolerated  the procedure well, and there were no complications.  Sponge and needle  counts were correct, at the end of the case.  The patient was taken to the  recovery room in stable condition.       CEF/MEDQ  D:  11/20/2003  T:  11/21/2003  Job:  [id number]

## 2010-07-14 NOTE — Op Note (Signed)
NAME:  Marks, [name]               ACCOUNT NO.:  [id number]   MEDICAL RECORD NO.:  [id number]          PATIENT TYPE:  AMB   LOCATION:  SDS                          FACILITY:  [facility]   PHYSICIAN:  Jessy Oto. Fields, MD  DATE OF BIRTH:  1955/06/26   DATE OF PROCEDURE:  11/27/2004  DATE OF DISCHARGE:                                 OPERATIVE REPORT   PROCEDURE:  Left forearm AV graft.   PREOPERATIVE DIAGNOSIS:  End stage renal disease.   POSTOPERATIVE DIAGNOSIS:  End stage renal disease.   ANESTHESIA:  Local with IV sedation.   ASSISTANT:  John Giovanni, P.A.-C.   OPERATIVE FINDINGS:  1.  A 6 mm PTFE.  2.  End-to-end antecubital vein.   DESCRIPTION OF PROCEDURE:  After obtaining informed consent, the patient was  taken to the operating. The patient was placed in the supine position on the  operating table. After adequate sedation, the patient's entire left upper  extremity was prepped and draped in the usual sterile fashion. Local  anesthesia was infiltrated near the crease. Transverse incision was made in  the antecubital area, carried down to subcutaneous tissues.  A plexus of  antecubital veins were dissected free circumferentially. The confluence of  these was approximately 3 mm in diameter. Next, the arteries dissected free  in the medial portion of the incision. Brachial artery was controlled  proximally and distally with Vesi-loops. Next, a subcutaneous tunnel was  created in a loop configuration down the forearm with a transverse incision  in the distal forearm for assistance in tunneling. A 6-mm PTFE graft was  then brought through the subcutaneous tunnel. The patient was then given  5000 units of intravenous heparin. Vesi-loops were pulled taut after  appropriate circulation time on the brachial artery. Longitudinal  arteriotomy was made, graft was slightly beveled and sewn end of graft to  side of artery using a running 6-0 Prolene suture. Just prior to completion  of  anastomosis, this was forebled, backbled and thoroughly flushed. Clamps  were released and the graft was clamped at its mid portion. Next, the Vesi-  loops pulled taut on the vein. Longitudinal venotomy was made. The graft was  beveled. Graft was then sewn end of graft to side of vein using a right 6-0  Prolene suture. Just prior to completion of anastomosis, this was forebled,  backbled and thoroughly flushed. Anastomosis was secured, clamps were  released.  There was a weakly palpable thrill in the graft immediately. The  radial Doppler signal was biphasic and augmented slightly with compression  of the graft. Hemostasis was obtained. Subcutaneous tissues reapproximated  using  running 3-0 Vicryl suture. Skin was then closed with 4-0 Vicryl subcuticular  stitch. The patient tolerated the procedure well and there were no  complications.  Sponge, needle and instrument counts were correct at the end  of the case. The patient taken to the recovery room in stable condition.           ______________________________  Jessy Oto Fields, MD     CEF/MEDQ  D:  11/27/2004  T:  11/27/2004  Job:  [id number]   cc:   Elzie Rings. Lorrene Reid, M.D.  Fax: [phone]

## 2010-07-14 NOTE — Op Note (Signed)
. [facility]  Patient:    [name] Marks, [name] Marks Visit Number: [id number] MRN: [id number]          Service Type: Attending:  Judeth Cornfield. Scot Dock, M.D. Dictated by:   Judeth Cornfield Scot Dock, M.D. Proc. 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  PROCEDURE:  Revision of right forearm arteriovenous graft by replacing an aneurysmal segment with an interposition 7 mm polytetrafluoroethylene graft.  SURGEON:  Judeth Cornfield. Scot Dock, M.D.  ASSISTANT:  Nurse.  ANESTHESIA:  Local with sedation.  DESCRIPTION OF PROCEDURE:  The patient was taken to the operating room and sedated by anesthesia.  The entire right upper extremity was prepped and draped in the usual sterile fashion.  After the skin was anesthetized with 1% lidocaine, I made oblique incisions over the venous limb of the graft proximal and distal to the aneurysmal segment.  The graft in these areas was dissected free, and then a tunnel was created between the two incisions lateral to the old graft.  A 7 mm stretch graft was tunneled between these two incisions. There was not an adequate-length segment to use a 7-10 graft, and therefore I had to use a 4-7 graft and cut off the tapered segment.  The patient was then heparinized.  Clamps were placed proximally and distally and then the graft was divided at both ends.  The new segment of graft was sewn end-to-end at both ends using continuous CV6 PTFE suture.  At the completion there was a good thrill in the graft.  Hemostasis was obtained in the wounds.  The wounds were closed with a deep layer of 3-0 Vicryl and the skin closed with 4-0 Vicryl.  A sterile dressing was applied.  The patient tolerated the procedure well and was transferred to the recovery room in  satisfactory condition.  All needle and sponge counts were correct. Dictated by:   Judeth Cornfield Scot Dock, M.D. Attending:  Judeth Cornfield. Scot Dock, M.D. DD:  01/10/01 TD:  01/10/01 Job: [id number]

## 2010-07-14 NOTE — Op Note (Signed)
NAME:  [name], Cyann               ACCOUNT NO.:  [id number]   MEDICAL RECORD NO.:  [id number]          PATIENT TYPE:  AMB   LOCATION:  SDS                          FACILITY:  [facility]   PHYSICIAN:  Rosetta Posner, M.D.    DATE OF BIRTH:  1956-01-27   DATE OF PROCEDURE:  05/17/2005  DATE OF DISCHARGE:  05/17/2005                                 OPERATIVE REPORT   PREOPERATIVE DIAGNOSIS:  End-stage renal disease.   POSTOP DIAGNOSIS:  End-stage renal disease.   PROCEDURE:  Thrombectomy, interposition jump graft, revision to higher  axillary vein of right upper arm AV Gore-Tex graft.   SURGEON:  Rosetta Posner, M.D.   ASSISTANT:  Jadene Pierini, PA-C   ANESTHESIA:  MAC.   COMPLICATIONS:  None.   DISPOSITION:  To recovery room stable.   PROCEDURE IN DETAIL:  The patient was taken operating room, placed supine  position where the area of the right arm, right axilla were prepped and  draped usual  sterile fashion. Incision made over the prior venous  anastomosis carried down to isolate the graft to vein anastomosis. The graft  was opened near the venous anastomosis.  The graft itself was  thrombectomized. The arterialized plug was removed and excellent inflow was  encountered. This was flushed with heparinized saline and reoccluded. The  vein was exposed further proximally and the old venous anastomosis was  excised. The vein was spatulated and new interposition graft of 7 mm Gore-  Tex was brought onto the field, was spatulated and sewn end-to-end to the  vein with a running 6-0 Prolene suture and then end-to-end the old graft  with running 6-0 Prolene suture. Clamps removed. Good thrill was noted.  The  wound was irrigated saline.  Hemostasis electrocautery.  Wounds closed 3-0  Vicryl subcutaneous and subcuticular tissue.  Benzoin and Steri-Strips were  applied.      Rosetta Posner, M.D.  Electronically Signed     TFE/MEDQ  D:  05/17/2005  T:  05/19/2005  Job:  [id number]

## 2010-07-14 NOTE — Op Note (Signed)
NAME:  [name] Marks, [name] Marks               ACCOUNT NO.:  [id number]   MEDICAL RECORD NO.:  [id number]          PATIENT TYPE:  OIB   LOCATION:  2866                         FACILITY:  [facility]   PHYSICIAN:  Rosetta Posner, M.D.    DATE OF BIRTH:  07-23-1955   DATE OF PROCEDURE:  02/04/2004  DATE OF DISCHARGE:  02/04/2004                                 OPERATIVE REPORT   PREOPERATIVE DIAGNOSIS:  End-stage renal disease with occluded right upper  arm A-V Gore-Tex graft.   POSTOPERATIVE DIAGNOSIS:  End-stage renal disease with occluded right upper  arm A-V Gore-Tex graft.   PROCEDURE:  Thrombectomy and Gore-Tex patch angioplasty of venous  anastomosis of right upper arm A-V Gore-Tex graft.   SURGEON:  Rosetta Posner, M.D.   ASSISTANT:  John Giovanni, P.A.-C.   ANESTHESIA:  MAC.   COMPLICATIONS:  None.   DISPOSITION:  To recovery room stable.   PROCEDURE IN DETAIL:  The patient was taken to the operating room and placed  in the supine position where the area of the right arm and right axilla were  prepped and draped using sterile fashion.  An incision was made over the  prior venous anastomosis, carried down to isolate the Gore-Tex graft to  axillary vein anastomosis.  This was an end-to-end anastomosis.  The graft  was opened longitudinally across the prior venous anastomosis and there was  a moderate stenosed venous anastomosis.  The vein above this was of good  caliber.  A 5 dilator passed centrally.  There was some evidence of  potential narrowing in the subclavian area with passing the Fogarty  catheter.  There was good backbleeding.  This was flushed with heparinized  saline and reoccluded.  Next, the graft itself was thrombectomized, the  arterialized plug was removed, and an excellent inflow was encountered.  This was flushed with heparinized saline and reoccluded.  The 0.4-mm [facility] patch was brought into the field and sewn as a patch angioplasty across  the venous anastomosis  with a running 6-0 Prolene suture.  Clamps were  removed and an excellent thrill was noted.  The wound was irrigated with  saline, hemostasis with electrocautery.  The wounds were closed with 3-0  Vicryl in the subcutaneous and subcuticular tissue, Benzoin and Steri-Strips  were applied.      Todd   TFE/MEDQ  D:  02/04/2004  T:  02/05/2004  Job:  [id number]

## 2010-07-14 NOTE — Op Note (Signed)
NAME:  Marks, [name]               ACCOUNT NO.:  [id number]   MEDICAL RECORD NO.:  [id number]          PATIENT TYPE:  AMB   LOCATION:  SDS                          FACILITY:  [facility]   PHYSICIAN:  Dorothea Glassman, M.D.    DATE OF BIRTH:  21-Dec-1955   DATE OF PROCEDURE:  04/25/2005  DATE OF DISCHARGE:  04/25/2005                                 OPERATIVE REPORT   SURGEON:  Gordy Clement, MD   ASSISTANT:  Jadene Pierini, PA.   ANESTHETIC:  Local with MAC.   PREOPERATIVE DIAGNOSES:  1.  End-stage renal failure.  2.  Clotted left upper arm arteriovenous graft.   POSTOPERATIVE DIAGNOSES:  1.  End-stage renal failure.  2.  Clotted left upper arm arteriovenous graft.   OPERATIVE PROCEDURE:  Thrombectomy, revision left upper arm arteriovenous  graft.   OPERATIVE PROCEDURE:  The patient brought the operating room stable  condition. Placed in supine position. Left arm prepped and draped in sterile  fashion. Skin, subcutaneous tissues instilled 1% Xylocaine with epinephrine.   A longitudinal skin incision made through the scar in left axilla.  Dissection carried down to expose the venous anastomosis. The basilic  axillary vein was the outflow. This was mobilized proximally in the axilla  controlled a Gregory clamp. The venous anastomosis taken down and the vein  oversewn distally with running 6-0 Prolene suture. The graft thrombectomized  several times with a 4 Fogarty catheter. Excellent inflow obtained. Graft  filled with heparin saline solution and controlled fistula clamp. A new  segment of 6 mm Gore-Tex was anastomosed end-to-end to the divided graft  using running 6-0 Prolene suture. This was then brought down to the vein  anastomosed end-to-end to the vein using running 6-0 Prolene suture. Clamps  were removed. Excellent flow present. Adequate hemostasis obtained. Sponge  instrument counts correct.   Subcutaneous tissue closed running 3-0 Vicryl suture in two layers. Skin  closed  4-0 Monocryl. Steri-Strips applied.   The patient tolerated procedure well. Transferred recovery room in stable  condition.      Dorothea Glassman, M.D.  Electronically Signed    PGH/MEDQ  D:  04/25/2005  T:  04/26/2005  Job:  [id number]

## 2010-07-14 NOTE — Op Note (Signed)
NAME:  [name] Marks, [name] Marks               ACCOUNT NO.:  [id number]   MEDICAL RECORD NO.:  [id number]          PATIENT TYPE:  INP   LOCATION:  5502                         FACILITY:  [facility]   PHYSICIAN:  Rosetta Posner, M.D.    DATE OF BIRTH:  [birth year]   DATE OF PROCEDURE:  12/01/2004  DATE OF DISCHARGE:                                 OPERATIVE REPORT   PREOPERATIVE DIAGNOSIS:  End-stage renal disease with occlusion of left  forearm loop arteriovenous Gore-Tex graft.   POSTOPERATIVE DIAGNOSIS:  End-stage renal disease with occlusion of left  forearm loop arteriovenous Gore-Tex graft.   PROCEDURE:  Thrombectomy with interposition jump graft and revision of  higher basilic vein of left forearm loop arteriovenous Gore-Tex graft.   SURGEON:  Rosetta Posner, M.D.   ASSISTANT:  Suzzanne Cloud, PA-C   ANESTHESIA:  MAC.   COMPLICATIONS:  None.   DISPOSITION:  To recovery room, stable.   PROCEDURE IN DETAIL:  The patient was taken to the operating room and placed  in a supine position where the area of the left arm was prepped and draped  in the usual sterile fashion.  The antecubital incision was reopened using  local anesthesia and the graft was opened near the venous anastomosis.  The  catheter could pass through the venous anastomosis, but there did appear to  be a sclerosis of the vein up to the above-elbow position of the basilic  vein.  The graft itself was thrombectomized.  The arterialized anastomosis  could not be traversed with the catheter; for this reason, the separate  incision was made near the arterial anastomosis and the arterialized plug  was removed through this area with good inflow.  A 3 dilator passed through  the arterial anastomosis without resistance.  This was flushed with  heparinized and reoccluded.  The incision in the graft near the arterial  anastomosis was closed a running 6-0 Prolene suture.  Next, a separate  incision was made at the above-elbow basilic vein  in an area where it could  be felt to be of good caliber with the Fogarty catheter.  The vein was  indeed of good caliber at this area and it was occluded proximally and  distally and was opened with an 11 blade and extended longitudinally with  Potts scissors.  The new 7-mm Gore-Tex graft was brought into the field and  was sewn end-to-side to the vein with a running 6-0 Prolene suture.  Clamps  were removed and the graft was flushed with heparinized saline and  reoccluded.  Next, this was tunneled down to the level of the old graft and  was cut to the appropriate end-to-end and sewn end-to-end to the old  loop graft with a running 6-0 Prolene suture.  Clamps were removed and an  excellent thrill was noted.  The wound was irrigated with saline and  hemostased with electrocautery.  Wounds were closed with 3-0 Vicryl in the  subcutaneous and subcuticular tissues and Benzoin and Steri-Strips were  applied.      Rosetta Posner, M.D.  Electronically Signed  TFE/MEDQ  D:  12/01/2004  T:  12/02/2004  Job:  [id number]

## 2010-07-14 NOTE — Op Note (Signed)
NAME:  [name] Marks, [name] Marks                         ACCOUNT NO.:  [id number]   MEDICAL RECORD NO.:  [id number]                   PATIENT TYPE:  OIB   LOCATION:  2899                                 FACILITY:  [facility]   PHYSICIAN:  Rosetta Posner, M.D.                 DATE OF BIRTH:  09/25/1955   DATE OF PROCEDURE:  05/01/2002  DATE OF DISCHARGE:  05/01/2002                                 OPERATIVE REPORT   PREOPERATIVE DIAGNOSIS:  End-stage renal disease.   POSTOPERATIVE DIAGNOSIS:  End-stage renal disease.   OPERATION:  1. Placement of right upper arm Gore-Tex graft.  2. Placement of right IJ Diatek catheter.   SURGEON:  Rosetta Posner, M.D.   ASSISTANT:  Sheliah Hatch, P.Marks.   ANESTHESIA:  MAC   COMPLICATIONS:  None.   DISPOSITION:  To recovery room stable.   DESCRIPTION OF PROCEDURE:  The patient was taken to the operating room and  placed in Marks position where the area of the right axilla and right arm were  prepped and draped in the usual sterile fashion.  Incision was made local  anesthesia over the antecubital space and carried down to dissect the  brachial artery which was of good caliber.  Next Marks separate incision was  made using local anesthesia at the axilla and the axillary vein was  identified.  The vein was of good caliber.  Marks tunnel was created to the  level of the antecubital space to the axilla and Marks 6 mm graft was brought  through the tunnel.  The vein was occluded proximally and distally and was  opened with #11 blade and Pott's scissors.  The graft was spatulated and  sewn end-to-side with running 6-0 Prolene suture.  Clamps were removed.  The  graft was flushed and reoccluded.  Next the brachial artery was occluded  proximally and distally.  It was opened with #11 blade and Pott's scissors.  Marks small arteriotomy was created.  The graft was sewn end-to-side to the  artery with Marks running 6-0 Prolene suture.  Clamps were removed and an  excellent thrill was noted.   The wound was irrigated with saline.  Hemostasis with electrocautery.  Wound closed with 3-0 Vicryl in the  subcutaneous and subcuticular tissue.  Steri-Strips were applied.  Next, the  right and left neck were imaged with ultrasound.  The right jugular vein was  of good caliber.  The patient was placed in Trendelenburg position.  Using  local anesthesia and Marks finer needle, the right internal jugular vein was  identified.  Next, using the Seldinger technique, Marks guide wire was passed  down to the level of the right atrium.  This was confirmed with fluoroscopy.  Next Marks dilator was passed over the guide wire and the dilator peel away  sheath was passed over the guide wire.  The guide wire and dilator were  removed  and the catheter was positioned down the peel away sheath which was  then removed as well.  The catheter was positioned at the level of the right  atrium.  Marks separate stab incision was made using local anesthesia and the  catheter was brought through the subcutaneous tunnel.  Both ports were  attached to the catheter and both flushed and  aspirated easily.  The catheter was flushed and locked with 1000 per cc  heparin.  The catheter was secured in the skin with Marks Nylon stitch and the  exit site was closed with Marks 4-0 subcuticular Vicryl stitch.  Sterile  dressing was applied and the patient was taken to the recovery room in  stable condition with Marks chest x-ray pending.                                               Rosetta Posner, M.D.    TFE/MEDQ  D:  05/01/2002  T:  05/02/2002  Job:  [id number]

## 2010-07-14 NOTE — Op Note (Signed)
NAME:  [name], [name] Marks A                         ACCOUNT NO.:  [id number]   MEDICAL RECORD NO.:  [id number]                   PATIENT TYPE:  OIB   LOCATION:  2871                                 FACILITY:  [facility]   PHYSICIAN:  Nelda Severe. Kellie Simmering, M.D.               DATE OF BIRTH:  Jan 24, 1956   DATE OF PROCEDURE:  11/03/2001  DATE OF DISCHARGE:  11/03/2001                                 OPERATIVE REPORT   PREOPERATIVE DIAGNOSIS:  Thrombosed arteriovenous Gore-Tex graft, right  forearm.   POSTOPERATIVE DIAGNOSIS:  Thrombosed arteriovenous Gore-Tex graft, right  forearm, secondary to degeneration of the arterial half of the loop.   OPERATION:  1. Thrombectomy, arteriovenous Gore-Tex graft, right forearm.  2. Replacement of arterial half of loop with 6-mm Gore-Tex.   SURGEON:  Nelda Severe. Kellie Simmering, M.D.   FIRST ASSISTANT:  Jody P. Marrianne Mood.   ANESTHESIA:  Local.   DESCRIPTION OF PROCEDURE:  The patient was taken to the operating room and  placed in a supine position, at which time the right upper extremity was  prepped with Betadine scrubbing solution and draped in routine sterile  manner.  After infiltration with 1% Xylocaine with epinephrine, a  longitudinal incision was made in the distal upper arm over the Gore-Tex  graft which had been revised into the basilic vein three months earlier.  Graft was dissected free proximally and distally, transverse opening made in  the graft, which was filled with fresh thrombus.  The venous limb, which was  about 5 cm from this opening in the graft, was easily thrombectomized and a  5 dilator would traverse the venous anastomosis without difficulty and  excellent venous backbleeding obtained.  The Fogarty passed into the forearm  portion of the loop and easily passed around the venous half of the loop but  did not traverse the arterial half because of degeneration in the loop.  Venous half of the loop had been previously replaced.  Therefore, an  incision was made over the arterial anastomosis, graft was dissected free  and transected and the stump of the arterial end on the brachial artery was  easily thrombectomized with excellent inflow present.  Another incision was  made at the apex of the loop in the forearm and the graft dissected free and  divided and a new piece of graft was tunneled within the old loop on the  ulnar half of the forearm and using 6-0 Prolene, end-to-end anastomoses were  performed in both areas.  Excellent inflow through the graft was then  obtained and the other opening in the graft was reclosed with two 6-0  Prolene sutures, clamp released and there was an excellent pulse and thrill  in the graft.  No protamine was given.  The wound was irrigated with saline  and closed in layers with Vicryl in a subcuticular fashion, sterile dressing  applied and patient taken to the recovery  room in satisfactory condition.                                               Nelda Severe Kellie Simmering, M.D.    JDL/MEDQ  D:  11/03/2001  T:  11/04/2001  Job:  [id number]

## 2010-07-14 NOTE — Op Note (Signed)
[facility]  Patient:    [name], Arley Visit Number: [id number] MRN: [id number]          Service Type: Attending:  Nelda Severe. Kellie Simmering, M.D. Dictated by:   Nelda Severe Kellie Simmering, M.D. Proc. Date: 02/14/01                             Operative Report  PREOPERATIVE DIAGNOSIS:  Thrombosed AV Gore-Tex graft, right arm.  POSTOPERATIVE DIAGNOSIS:  Thrombosed AV Gore-Tex graft, right arm.  OPERATION:  Thrombectomy AV Gore-Tex graft right arm with insertion of a new segment of 6 mm graft from existing graft and to basilic vein.  SURGEON:  Nelda Severe. Kellie Simmering, M.D.  FIRST ASSISTANT:  Lynnell Catalan, P.A.  ANESTHESIA:  Local.  DESCRIPTION OF PROCEDURE:  Patient was taken to the operating room, placed in the supine position, at which time the right upper extremity was prepped with Betadine scrub and solution, draped in a routine sterile manner.  After infiltration with 1% Xylocaine with epinephrine a longitudinal incision was made in the upper arm of the Gore-Tex to basilic vein anastomosis.  Heparin 3000 units was given intravenously, a transverse opening made in the graft. The graft was easily thrombectomized with excellent inflow being reestablished.  There was concentric intimal hyperplasia over about 2 cm of the vein adjacent to the anastomosis.  The vein was dissected free proximal to this where it was 4.5 to 5 mm in size.  A new piece of graft was obtained, anastomosed end-to-end to the old graft and end-to-end to the vein with 6-0 Prolene, clamps released, and there was an excellent pulse and thrill in the graft.  No protamine was given.  The wound was irrigated with saline, closed in layers with Vicryl in a subcuticular fashion, a sterile dressing applied, and patient taken to the recovery room in satisfactory condition. Dictated by:   Nelda Severe Kellie Simmering, M.D. Attending:  Nelda Severe. Kellie Simmering, M.D. DD:  02/14/01 TD:  02/15/01 Job: [id number]

## 2010-07-14 NOTE — Discharge Summary (Signed)
NAME:  [name] Marks, [name] Marks               ACCOUNT NO.:  [id number]   MEDICAL RECORD NO.:  [id number]          PATIENT TYPE:  INP   LOCATION:  5506                         FACILITY:  [facility]   PHYSICIAN:  Rosetta Posner, M.D.    DATE OF BIRTH:  09-25-1955   DATE OF ADMISSION:  03/27/2006  DATE OF DISCHARGE:  03/28/2006                               DISCHARGE SUMMARY   HISTORY OF PRESENT ILLNESS:  The patient is a 55 year old female  referred to Dr. Donnetta Hutching for dialysis access procedure.   PHYSICAL EXAMINATION:  Please see the history and physical done at the  time of admission.   PAST MEDICAL HISTORY:  1. End-stage renal disease.  2. Other diagnoses as per history, these include secondary      hyperparathyroidism.  3. Anemia.  4. Gout.   HOSPITAL COURSE:  The patient was admitted electively on March 27, 2006, for placement of a right femoral loop AV Gore-Tex graft by Dr.  Donnetta Hutching.  She tolerated it well and was taken to the [facility] in stable condition.  Postoperative hospital course, the patient  did well.  She was seen in nephrology consultation to follow her during  the hospitalization.  She was dialyzed on postoperative day #1, and felt  to be quite stable at that time for discharge.  Her right foot was noted  to have a 2+ dorsalis pedis pulse and there was Doppler flow in the  graft.  She was felt to be quite stable from a medical viewpoint for  discharge as well.   CONDITION ON DISCHARGE:  Stable and improving.   INSTRUCTIONS:  The patient received written instructions in regard to  medications, activity, diet, wound care, and followup.   FOLLOWUP:  Dr. Donnetta Hutching on an p.r.n. basis.   MEDICATIONS ON DISCHARGE:  Were as preoperatively.  For pain - Percocet  one to two q.4 h as needed.   FINAL DIAGNOSIS:  End-stage renal disease, now status post placement of  new right thigh femoral loop graft.     John Giovanni, P.A.-C.      Rosetta Posner, M.D.  Electronically Signed   WEG/MEDQ  D:  07/25/2006  T:  07/25/2006  Job:  [id number]

## 2010-07-14 NOTE — Op Note (Signed)
NAME:  [name], Annaliah               ACCOUNT NO.:  [id number]   MEDICAL RECORD NO.:  [id number]          PATIENT TYPE:  AMB   LOCATION:  SDS                          FACILITY:  [facility]   PHYSICIAN:  Judeth Cornfield. Scot Dock, M.D.DATE OF BIRTH:  [birth year]   DATE OF PROCEDURE:  06/07/2005  DATE OF DISCHARGE:  06/07/2005                                 OPERATIVE REPORT   PREOPERATIVE DIAGNOSIS:  Clotted left upper arm AV graft.   POSTOPERATIVE DIAGNOSIS:  Clotted left upper arm AV graft with infection of  left upper arm AV graft.   PROCEDURE:  Removal of infected left upper arm AV graft with vein patch  angioplasty to the brachial artery and ultrasound-guided placement of left  IJ Diatek catheter.   SURGEON:  Judeth Cornfield. Scot Dock, M.D.   ASSISTANT:  Joesph Fillers, PA   ANESTHESIA:  Local with sedation.   TECHNIQUE:  The patient was taken to the operating room, sedated by  anesthesia.  The left upper extremity was prepped and draped in usual  sterile fashion.  After the skin was anesthetized,  incision the axilla was  opened and the graft dissected free It had purulent material around it and  culture was sent.  The graft was not incorporated and it was felt that the  patient had a graft infection likely explaining the graft thrombosis.  The  dissection was carried down onto the vein which was then ligated and then  the graft was divided and venous anastomosis excised.  The segment of vein  was saved to be used as a vein patch.  Separate longitudinal incision was  made over the arterial anastomosis after the skin was anesthetized and here  the arterial limb of the graft was divided and then using one incision  between the two incisions was the one area where the graft was stuck.  The  graft here was dissected free and the entire graft was removed.  Next the  dissection was carried down to the brachial artery proximally and distally  to the anastomosis.  The patient was heparinized.   The brachial artery was  clamped proximally and distally and the entire graft was removed from the  brachial artery.  The vein patch was then sewn using continuous 6-0 Prolene  suture.  At completion there was good radial signal with the Doppler.  Hemostasis was obtained in the wounds.  The wounds were closed with a deep  layer of 3-0 Vicryl and the skin closed with 4-0 Vicryl.   Sterile dressing was applied and attention turned to placement of a Diatek  catheter.  Using the ultrasound scanner it looked like the right IJ was  occluded.  Left IJ appeared patent and was marked.  The neck and upper chest  were prepped and draped in usual sterile fashion.  After the skin was  infiltrated with 1% lidocaine, the left IJ was cannulated and guidewire  introduced into the superior vena cava under fluoroscopic control.  The  tract over the wire was dilated and then a dilator and peel-away sheath  passed over the wire and wire and  dilator removed.  The catheter was passed  through the peel-away sheath and positioned in the right atrium.  The exit  site for the catheter was selected and the skin anesthetized between the two  areas.  The cath was then brought through the tunnel, cut to the appropriate  length and the distal ports were attached.  Both ports withdrew easily, were  then flushed with heparinized saline and filled with concentrated heparin.  The catheter was secured at its exit site with a 3-0 nylon  suture.  The IJ cannulation site was closed with 4-0 subcuticular stitch.  Sterile dressings applied.  The patient tolerated procedure well was  transferred to recovery room in satisfactory condition.  All needle and  sponge counts were correct.      Judeth Cornfield. Scot Dock, M.D.  Electronically Signed     CSD/MEDQ  D:  06/07/2005  T:  06/08/2005  Job:  [id number]

## 2010-07-14 NOTE — Op Note (Signed)
NAME:  [name], Layce               ACCOUNT NO.:  [id number]   MEDICAL RECORD NO.:  [id number]            PATIENT TYPE:   LOCATION:                                 FACILITY:   PHYSICIAN:  Rosetta Posner, M.D.         DATE OF BIRTH:   DATE OF PROCEDURE:  04/04/2005  DATE OF DISCHARGE:                                 OPERATIVE REPORT   PREOPERATIVE DIAGNOSIS:  End-stage renal disease with occluded left upper  arm AV Gore-Tex graft.   POSTOP DIAGNOSIS:  End-stage renal disease with occluded left upper arm AV  Gore-Tex graft.   PROCEDURES:  Thrombectomy, interposition jump graft, revision venous  anastomosis, left upper arm AV Gore-Tex graft.   SURGEON:  Dr. Sherren Mocha Early.   ASSISTANT:  Doroteo Bradford, PA-C.   ANESTHESIA:  MAC.   COMPLICATIONS:  None.   DISPOSITION:  To recovery room stable.   PROCEDURE IN DETAIL:  The patient was taken operating room placed position  where area the left arm prepped and draped in the usual sterile fashion.  Using local anesthesia, we made prior venous anastomosis at the axilla and  this carried down to isolate the graft to vein anastomosis. This was an end-  to-end anastomosis. There was stenosis at the venous anastomosis. The vein  was of good caliber proximal to this. The vein was exposed and was occluded  further proximally. The old anastomosis was excised. The graft itself was  thrombectomized. The arterialized plug was removed and good inflow was  encountered. This was flushed with heparinized saline and reoccluded. The  patient was given 6000 units of intravenous heparin. A 7 mm Gore-Tex graft  was brought onto the field and sewn end-to-end to the vein with a running 6-  0 Prolene suture and then was sewn end-to-end to the old graft with a  running 6-0 Prolene suture. Clamps removed. Good thrill was noted.  The  wound was irrigated with saline, hemostasis electrocautery.  Wounds closed 3-  0 Vicryl the subcutaneous, subcuticular tissue.   Benzoin and Steri-Strips  applied. The patient did have a blood pressure 90s throughout the procedure.      Rosetta Posner, M.D.  Electronically Signed     TFE/MEDQ  D:  04/04/2005  T:  04/04/2005  Job:  [id number]

## 2010-07-14 NOTE — H&P (Signed)
NAME:  [name] Marks, [name] Marks               ACCOUNT NO.:  [id number]   MEDICAL RECORD NO.:  [id number]          PATIENT TYPE:  EMS   LOCATION:  [facility]                         FACILITY:  [facility]   PHYSICIAN:  Rolland Porter, M.D.        DATE OF BIRTH:  Feb 10, 1956   DATE OF ADMISSION:  11/28/2004  DATE OF DISCHARGE:                                HISTORY & PHYSICAL   CHIEF COMPLAINT:  Chills, nonproductive cough, and right lateral neck pain.   HISTORY OF PRESENT ILLNESS:  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   She presents to the ER via EMS from [place] due to a drop in her systolic blood  pressure (93 to 76) during treatment that then caused her to vomit.  Post-  systolic blood pressure 99991111 (sit-stand).  She walked to ___________and  became dizzy to the point that she required assistance to floor.  Systolic  blood pressure at that point was 47.  She then received 250 cc of normal  saline via catheter/IV?, then systolic blood pressure check began twice (97-  104), and heart rate 91-86, with a temperature of 96.8.  EMS arrived at the  request of family and her O2 was reportedly in the mid to upper 80s on room  air.  She was placed on 2 L of oxygen and transported to [facility] ER.   Of note, the patient received Z-Pak prescription and finished it  approximately two to three days ago due to a cough productive of green  phlegm.  The last few days, she states her cough is now dry, but has  experienced chills and right lateral neck tenderness, but no fever, dysuria  (nausea and vomiting- prior to today), or diarrhea.  Also, underwent left  forearm AV graft placement yesterday due to currently receiving hemodialysis  via right IJ catheter for last six weeks.   PAST MEDICAL HISTORY:  1.  End-stage renal disease.  2.  Hypertension.  3.  Iron-deficiency anemia.  4.  Left rotator cuff  impingement.  5.  Gout.  6.  Gastritis, hiatal hernia, duodenitis without hemorrhage, diverticulosis      per pan-endoscopy November 2005, by Dr. Vladimir Faster.  7.  Decreased uptake anterior wall with no significance on Cardiolite,      prescribed Imdur and nitroglycerin in September 2004.  8.  Hypotension.  9.  Secondary hyperparathyroidism.   SOCIAL HISTORY:  Retired Radiation protection practitioner.  Denies tobacco, alcohol, or drug use.  Lives alone.  Has a supportive family.   FAMILY HISTORY:  Mother and several family members with a history of end-  stage renal disease.  Father deceased with a history of colon cancer.   ALLERGIES:  ANCEF causes GI upset, but tolerates amoxicillin/penicillin.   MEDICATIONS:  1.  Midodrine 10 mg p.o. t.i.d. (recently increased from daily).  2.  Os-Cal 500 two with meals.  3.  Nexium 40 mg p.o. daily.  4.  Benadryl 25 mg p.r.n.  5.  Aspirin 325  mg daily.  6.  Colchicine 0.6 mg daily p.r.n.  7.  Allopurinol 200 mg p.o. daily.  8.  Renagel 800 mg two with meals.  9.  Nephro-Vite one tablet p.o. daily.  10. Epogen 10,500 units IV every hemodialysis.  11. Hectorol 2 mcg IV every hemodialysis.  12. Levocarnitine 2 g IV after hemodialysis.  13. InFed 50 mg IV every Thursday.   REVIEW OF SYSTEMS:  See HPI and past medical history;  denies headache,  change in vision, or hearing, melanotic stools, prolonged bleeding,  diabetes, or recent gouty attacks.   LABORATORY DATA:  Sodium 137, potassium 4.4, chloride 103, BUN 24,  creatinine 7.1, glucose 127.  White count 13.6, platelets 303, hemoglobin  11.9, 94% neutrophils, 4% lymphocytes.  Troponin-I POC less than 0.05, CK-MB  POC 1.9.  Chest x-ray shows right lower lobe density, questionable  infiltrate.   PHYSICAL EXAMINATION:  VITAL SIGNS:  Blood pressure 86/41 leg cuff, 96% on 2  L.  Temperature max of 99.  Temperature 98.7.  GENERAL APPEARANCE:  Alert and oriented x3, no acute distress.  Black woman  with daughter at  bedside.  HEENT:  Atraumatic.  TM's clear.  Throat clear.  Nares patent.  NECK:  No lymphadenopathy, no JVD, however, non-erythematous but tender  swelling proximal aspect right IJ tunnel.  Access:  Right IJ, no erythema or  drainage.  HEART:  Regular rate and rhythm without clicks, gallops, or rubs.  LUNGS:  Poor respiratory effort, no wheezing or rales, however, positive for  crackles right mid lobe area.  ABDOMEN:  Positive bowel sounds, soft.  EXTREMITIES:  No edema to lower extremities, positive swelling of left arm  with good thrill and bruit to forearm AV graft without signs of infection.   ASSESSMENT AND PLAN:  1.  Pneumonia per chest x-ray.  Give antitussive, O2, continue to check O2      saturations due to hypoxia.  2.  Possible catheter infection due to temperature.  Blood cultures x2 drawn      at hospital, give vancomycin and tobramycin, will hold off on      discontinuation of catheter for now.  3.  Hypotension.  Levocarnitine given at hemodialysis today.  Had increased      volume to remove in short three hour treatment.  The patient agrees to a      3 hour 45 minutes treatment from now on.  We will continue Midodrine 10      mg p.o. t.i.d.  Not sure if low blood pressure is related to infection      seeing how low blood pressure is not really new for her.  4.  End-stage renal disease.  On Tuesday, Thursday, and Saturday [place] a full treatment today via right IJ, status post left forearm      AV graft placement as of November 27, 2004.  5.  Anemia.  Stable hemoglobin with Epogen and InFed.  6.  Secondary hyperparathyroidism.  Binders and Hectorol.  7.  History of gastritis/duodenitis.  Give proton pump inhibitor.  8.  Disposition:  Wishes to return home alone after discharge.     ______________________________  Windy Kalata, M.D.    ______________________________ Rolland Porter, M.D.    MTM/MEDQ  D:  11/28/2004  T:  11/28/2004  Job:  [id number]    cc:   [facility]

## 2010-07-14 NOTE — Op Note (Signed)
[place]. [facility]  Patient:    [name] Marks, [name] Marks                      MRN: [id number] Proc. Date: 06/14/99 Adm. Date:  [id number] Disc. Date: [id number] Attending:  Lavonna Monarch                           Operative Report  PREOPERATIVE DIAGNOSES: 1. End-stage renal failure. 2. Clotted right forearm loop arteriovenous Gore-Tex graft.  POSTOPERATIVE DIAGNOSES: 1. End-stage renal failure. 2. Clotted right forearm loop arteriovenous Gore-Tex graft.  PROCEDURE:  Thrombectomy and revision of right forearm loop AV Gore-Tex graft.  SURGEON:  Gordy Clement, M.D.  ASSISTANT:  Ricki Miller, P.Marks.  ANESTHESIA:  General endotracheal.  ANESTHESIOLOGIST:  Ala Dach, M.D.  DESCRIPTION OF PROCEDURE:  The patient was brought to the operating room in stable condition.  Placed in the supine position.  Initially placed under MAC with local anesthetic.  However, she was too restless, and therefore, was placed under general endotracheal anesthesia.  The right arm prepped and draped in Marks sterile fashion.  Marks longitudinal skin incision made through the right antecubital fossa, extending his proximally over the venous anastomosis.  Dissection carried down to expose the venous limb of the graft.  This was followed down to an end-to-end anastomosis o the deep brachial vein.  The vein was mobilized proximally and controlled with  Gregory clamp.  The venous anastomosis was excised.  The graft thrombectomized ith Marks 5 Fogarty catheter.  Excellent inflow obtained.  Graft filled with heparin and saline solution, and controlled with Marks fistula clamp.  Marks new segment of 6 mm Gore-Tex was then anastomosed end-to-end to the divided graft using Marks running 6-0 Prolene suture.  The 5 Fogarty catheter was passed up the vein with Marks small amount of thrombus return and good venous back bleeding obtained. The vein flushed with heparin and saline solution, and  controlled with Marks Gregory clamp. The vein was bevelled and anastomosed end-to-end to the graft using running  6-0 Prolene suture.  Clamps were then removed.  Excellent flow was present through the graft.  Adequate hemostasis obtained.  Sponge and instrument counts correct.  The subcutaneous tissue closed with running 3-0 Vicryl suture in two subcutaneous layers, 4-0 Monocryl applied to the skin, 1/2 inch Steri-Strips applied, and sterile dressing applied.  The patient was transferred to the recovery room in stable condition. DD:  06/14/99 TD:  06/15/99 Job: [id number]

## 2010-07-14 NOTE — Op Note (Signed)
NAME:  [name] Marks, [name] Marks               ACCOUNT NO.:  [id number]   MEDICAL RECORD NO.:  [id number]          PATIENT TYPE:  AMB   LOCATION:  SDS                          FACILITY:  [facility]   PHYSICIAN:  Charles E. Fields, MD  DATE OF BIRTH:  18-Oct-1955   DATE OF PROCEDURE:  10/23/2004  DATE OF DISCHARGE:  10/23/2004                                 OPERATIVE REPORT   PROCEDURE:  1.  Ultrasound of right neck.  2.  Insertion of Diatek catheter.   PREOPERATIVE DIAGNOSIS:  End-stage renal disease.   POSTOPERATIVE DIAGNOSIS:  End-stage renal disease.   ANESTHESIA:  Local with IV sedation.   SURGEON:  Jessy Oto. Fields, MD   ASSISTANT:  Nurse.   OPERATIVE FINDINGS:  A 23-cm Diatek catheter in right internal jugular vein.   OPERATIVE DETAILS:  After obtaining informed consent, the patient was taken  to the operating room.  The patient was placed in supine position on the  operating table.  Next, the patient's right neck was inspected with a  SiteRite ultrasound.  The right internal jugular vein was found be widely  patent with normal respiratory variability and compressibility.  The  patient's entire neck and chest were then prepped and draped in the usual  sterile fashion.  The patient was placed in Trendelenburg position.  Local  anesthesia was infiltrated over the right internal jugular vein.  A small  finder needle was used to localize the right internal jugular vein.  A  larger introducer needle was used to cannulate the right internal jugular  vein.  A 0.35 J-tipped guidewire was threaded down through the right  internal jugular vein into the inferior vena cava under fluoroscopic  guidance.  Next, sequential 12-, 14- and 16-French dilators with a peel-away  sheath were placed over the guidewire into the right atrium.  The dilator  and guidewire were removed, a 23-cm Diatek catheter threaded down through  the peel-away sheath into the right atrium.  The catheter was then tunneled  out  subcutaneously, cut to length and the hub attached.  The catheter was  noted to flush and draw easily.  The catheter was loaded with concentrated  heparin solution.  Catheter was sutured to the skin with nylon sutures and  the neck insertion site closed with a Vicryl stitch.  The patient tolerated  the procedure well and there were no immediate complications.  Instrument,  sponge and needle count was correct at the end of the case.  The patient was  taken to the recovery room in stable condition.           ______________________________  Jessy Oto Fields, MD     CEF/MEDQ  D:  10/23/2004  T:  10/24/2004  Job:  [id number]

## 2010-07-14 NOTE — Op Note (Signed)
[place]. [facility]  Patient:    [name] Marks                         MRN: [id number] Proc. Date: 03/22/99 Adm. Date:  [id number] Disc. Date: [id number] Attending:  Lavonna Monarch                           Operative Report  SURGEON:  Gordy Clement, M.D.  ASSISTANT:  Sherlean Foot. Lucia Gaskins, P.A.  ANESTHESIA:  Local with ______ .  ANESTHESIOLOGIST:  Jessy Oto. Albertina Parr, M.D.  PREOPERATIVE DIAGNOSES: 1. End-stage renal failure. 2. Clotted right forearm loop, AV Gore-Tex graft.  POSTOPERATIVE DIAGNOSES: 1. End-stage renal failure. 2. Clotted right forearm loop AV Gore-Tex graft.  PROCEDURE:  Thrombectomy and revision of right forearm loop AV Gore-Tex graft.  OPERATIVE PROCEDURE:  The patient was brought to the operating room in stable condition and was placed in the supine position.  The right arm was prepped and  draped in a sterile fashion.  Skin and subcutaneous tissue was instilled with 1% Xylocaine with epinephrine.  Longitudinal skin incision was made over the venous anastomosis.  The dissection was carried down through the subcutaneous tissue to expose the venous limb of the graft.  This was followed down to an end-to-side anastomosis to the basilic vein. The basilic vein was further exposed proximally in the arm.  At the level of the anastomosis, the vein was very small but enlarged significantly more proximally in the upper arm.  The vein was controlled proximally and distal with serrefine clamps.  The venous anastomosis was taken down, and the venotomy was extended proximally.  The graft was thrombectomized with a #5 Fogarty catheter.  The patient was administered 3000 units of heparin intravenously.  The graft was controlled  with a fistula clamp.  A new segment of 6-mm Gore-Tex was then anastomosed end-to-end to the divider graft using a running 6-0 Prolene suture.  This was then anastomosed end-to-side to the basilic vein using  a running 6-0 Prolene suture.  Clamps were removed.  Excellent flow was present through the graft.  Adequate hemostasis was obtained.  Sponge and instrument counts were correct.  Subcutaneous tissue was closed with a running 3-0 Vicryl suture.  Skin was closed with 4-0 Monocryl.  Half-inch Steri-Strips were applied.  The patient was transferred to the recovery room in stable condition. DD:  03/23/99 TD:  03/23/99 Job: [id number]

## 2010-07-14 NOTE — Op Note (Signed)
NAME:  [name] Marks, [name] Marks               ACCOUNT NO.:  [id number]   MEDICAL RECORD NO.:  [id number]          PATIENT TYPE:  AMB   LOCATION:  DFTL                         FACILITY:  [facility]   PHYSICIAN:  Nelda Severe. Kellie Simmering, M.D.  DATE OF BIRTH:  08-10-1955   DATE OF PROCEDURE:  02/07/2005  DATE OF DISCHARGE:  02/07/2005                                 OPERATIVE REPORT   PREOPERATIVE DIAGNOSIS:  End-stage renal disease.   POSTOPERATIVE DIAGNOSIS:  End-stage renal disease.   PROCEDURES:  Insertion of left upper arm AV Gore-Tex graft from the brachial  artery to axillary vein (6 mm Gore-Tex).   SURGEON:  Nelda Severe. Kellie Simmering, M.D.   FIRST ASSISTANT:  Faylene Million Dominick, PA.   ANESTHESIA:  Local.   PROCEDURE:  The patient was taken to the operating room, placed in the  supine position; at which time the left upper extremity was prepped with  Betadine scrubbing solution and draped in a routine sterile manner. After  infiltration of 1% Xylocaine with epinephrine, a longitudinal incision was  made in the distal upper arm and the brachial artery exposed; encircled with  vessel loops. It was a small vessel, but was free of disease and had a good  pulse. A second incision was made just distal to the axilla, and the  axillary vein exposed and encircled with vessel loops. A curvilinear tunnel  was created on the anterior aspect of the arm, after infiltration with  Xylocaine.  A 6 mm Gore-Tex graft was delivered through the tunnel, and 3000  units of heparin given intravenously. The artery was occluded proximally and  distally with vessel loops, opened with a #15 blade and extended with Potts  scissors. The vessel was 3 mm in size and had good flow. The graft was  anastomosed end-to-side with 6-0 Prolene. Clamps were then released, and  there was a good flow from the graft. The vein was ligated distally and  transected.  Then an end-to-end anastomosis was done between graft and  axillary vein with 6-0  Prolene. The clamps were then released and there was  a palpable pulse and thrill, and excellent Doppler flow in the graft. No  protamine was given. The wounds were irrigated with saline and closed in  layers with Vicryl in a subcuticular fashion. Sterile dressing applied. The  patient taken to the recovery room in satisfactory condition.           ______________________________  Nelda Severe Kellie Simmering, M.D.     JDL/MEDQ  D:  02/07/2005  T:  02/08/2005  Job:  [id number]

## 2010-07-14 NOTE — Op Note (Signed)
NAME:  [name] Marks, [name] Marks               ACCOUNT NO.:  [id number]   MEDICAL RECORD NO.:  [id number]          PATIENT TYPE:  AMB   LOCATION:  SDS                          FACILITY:  [facility]   PHYSICIAN:  Rosetta Posner, M.D.    DATE OF BIRTH:  Sep 24, 1955   DATE OF PROCEDURE:  03/27/2006  DATE OF DISCHARGE:                               OPERATIVE REPORT   PREOPERATIVE DIAGNOSIS:  End-stage renal disease.   POSTOPERATIVE DIAGNOSIS:  End-stage renal disease.   OPERATION/PROCEDURE:  Right femoral loop AV Gore-Tex graft placement.   SURGEON:  Rosetta Posner, M.D.   ASSISTANT:  Jadene Pierini, PA-C   ANESTHESIA:  General endotracheal.   COMPLICATIONS:  None.   DISPOSITION:  To recovery room stable.   PROCEDURE IN DETAIL:  The patient was taken to operating room and placed  in the supine position.  The area over the right groin and right leg  were prepped and draped in a sterile fashion.  Incision was made  obliquely over the inguinal crease and carried down to isolate the  common femoral artery, the superficial femoral and profunda femoris  arteries.  The patient had relatively small arteries.  The common  femoral vein was exposed just proximal to the saphenofemoral junction.  Two separate incisions were made over the distal thigh and a loop  configuration tunnel was created in the thigh.  A 6 mm standard wall  Gore-Tex graft was brought through the tunnel.  The common femoral vein  was occluded with a partial occlusion clamp and was opened on the  anterior surface with 11 blade __________ Leroy Sea scissors.  The graft was  spatulated, sewn end-to-side to the vein with a running 6-0 Prolene  suture.  Clamps removed from the vein.  The graft was flushed with  heparinized saline and reoccluded.  The patient was given 5000 units of  intravenous heparin.  After adequate circulation time, the common,  superficial femoral and profunda femoris arteries were occluded.  The  common femoral artery was  opened and a small arteriotomy was created.  The graft was cut to appropriate length, was spatulated and sewn end-to-  side to the artery with a running 6-0 Prolene suture.  Clamps removed  and excellent thrill was noted.  The wound was irrigated with saline.  Hemostasis with electrocautery cautery.  The wounds were closed with 3-0  Vicryl in subcutaneous and subcuticular tissue at the  tunneling site on the thigh.  The groin was closed with the several  layers of 2-0 Vicryl in the subcutaneous tissue and skin was closed with  a 3-0 subcuticular Vicryl stitch.  Sterile dressing was applied.  The  patient was taken to recovery room in stable condition.      Rosetta Posner, M.D.  Electronically Signed     TFE/MEDQ  D:  03/27/2006  T:  03/27/2006  Job:  [id number]

## 2010-07-14 NOTE — Discharge Summary (Signed)
NAME:  [name], Ruthann               ACCOUNT NO.:  [id number]   MEDICAL RECORD NO.:  [id number]          PATIENT TYPE:  INP   LOCATION:  5502                         FACILITY:  [facility]   PHYSICIAN:  Windy Kalata, M.D.DATE OF BIRTH:  [birth year]   DATE OF ADMISSION:  11/28/2004  DATE OF DISCHARGE:  12/02/2004                                 DISCHARGE SUMMARY   ADMITTING DIAGNOSES:  1.  Pneumonia.  2.  Fever, chills in a patient with PermCath, rule out sepsis.  3.  Hypotension.  4.  End-stage renal disease on chronic dialysis.  5.  Anemia of chronic disease.  6.  Secondary hyperparathyroidism   DISCHARGE DIAGNOSES:  1.  Gram-positive cocci pneumonia, improved on antibiotics.  2.  Fever and chills, resolved. Blood cultures negative.  3.  Hypotension resolve.  4.  End-stage renal disease on chronic hemodialysis.  5.  Anemia of chronic disease, status post transfusion.  6.  Secondary hyperparathyroidism.  7.  Status post thrombectomy and revision of left AV Gore-Tex graft December 01, 2004 Dr. Curt Jews.   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 He was admitted with fever,  chills, hypotension, dizziness. Chest x-ray in the emergency room shows an  infiltrate. She has completed a Z-Pak prescription approximately three days  prior to admission for which she was being treated for a productive cough  with green phlegm. She states her cough now is dry but she is having chills,  nausea and vomiting. She is status post left forearm AV Gore-Tex graft  placement one day prior to admission and currently has a right IJ Diatek  catheter through which she receives dialysis.   LABS ON ADMISSION:  Sodium 137, potassium 4.4, chloride 103, BUN 24,  creatinine 7.1, glucose 127. White count 13,600, platelets 303,000,  hemoglobin 11.9.  Chest X-Ray: Right lower lobe density.   HOSPITAL COURSE:  Problem 1:  PNEUMONIA:  The patient was admitted and  pancultured and placed on empiric vancomycin, tobramycin and Avelox. Her  fevers and hypotension resolved. Within 24 hours, she was switched to Avelox  only. Blood cultures remained negative at the time of discharge. Avelox was  unaffordable to the patient so she was given Cipro 500 milligrams on the day  of discharge and a prescription for six more pills which would complete a 10-  day course of antibiotics.   Problem 2:  ANEMIA:  The patient's hemoglobin drifted from 11.9 to 8.8 this  admission. She did have two procedures in the past week; the first being the  graft placement and the second being thrombectomy and revision on December 01, 2004. She was transfused two units packed red blood cells. EPO was increased  to 20,000 units IV each dialysis. Stool cards will be issued at the [facility] and followed up.   DISCHARGE MEDICATIONS:  1.  Midodrine 10 milligrams t.i.d.  2.  Calcium 500 milligrams two with meals.  3.  Nexium 40  milligrams at bedtime.  4.  Coated aspirin 325 milligrams daily.  5.  Colchicine 0.6 milligrams daily.  6.  Allopurinol 200 milligrams daily.  7.  Renagel 800 milligrams two with meals.  8.  Nephro-Vite one daily.  9.  Cipro 500 milligrams one every evening x6 more days.  10. EPO 20,000 units IV each dialysis.  11. Levocarnitine 2 grams IV each dialysis.  12. Hectorol 2 mcg IV each dialysis.  13. InFeD 50 milligrams IV weekly at the [facility].      Nonah Mattes, P.A.    ______________________________  Windy Kalata, M.D.    RRK/MEDQ  D:  01/13/2005  T:  01/13/2005  Job:  [id number]   cc:   [place]

## 2010-07-14 NOTE — Op Note (Signed)
NAME:  [name] Marks, [name] Marks               ACCOUNT NO.:  [id number]   MEDICAL RECORD NO.:  [id number]          PATIENT TYPE:  OIB   LOCATION:  2858                         FACILITY:  [facility]   PHYSICIAN:  Nelda Severe. Kellie Simmering, M.D.  DATE OF BIRTH:  1955/10/31   DATE OF PROCEDURE:  09/25/2004  DATE OF DISCHARGE:  09/25/2004                                 OPERATIVE REPORT   PREOPERATIVE DIAGNOSIS:  End-stage renal disease with thrombosed  arteriovenous Gore-Tex graft, right upper arm.   POSTOPERATIVE DIAGNOSIS:  End-stage renal disease with thrombosed  arteriovenous Gore-Tex graft, right upper arm.   OPERATION:  Thrombectomy of arteriovenous Gore-Tex graft, right upper arm,  with insertion of new segment of graft from existing graft to axillary vein  with 6-mm Gore-Tex.   SURGEON:  Nelda Severe. Kellie Simmering, M.D.   FIRST ASSISTANT:  Suzzanne Cloud, P.A.   ANESTHESIA:  Local.   PROCEDURE:  The patient was taken to the operating room and placed in a  supine position, at which time the right upper extremity was prepped with  Betadine scrub and solution and draped in routine sterile manner.  After  infiltration of 1% Xylocaine, longitudinal incision was made through the  previous scar in the axilla and the Gore-Tex-to-axillary-vein anastomosis  dissected free.  Three thousand units of heparin were given intravenously.  Transverse opening was made in the graft proximal to the anastomosis, which  had also had a patch angioplasty performed.  Graft itself was easily  thrombectomized, but at that time the patient's blood pressure did drop from  the systolic of [id number] range down into the 50s transiently.  She was given  fluid administration and some IV pressors to improve the blood pressure,  which did eventually respond.  There was intimal hyperplasia at the venous  anastomosis, but the vein proximal to this was 50-mm in size.  This was  excised and a new piece of 6-mm graft anastomosed end-to-end to the old  graft  and end-to-end to the proximal vein, clamps released and there was a  good pulse and thrill in the graft.  No protamine was given.  The wound was  irrigated with saline, closed in layers with Vicryl in a subcuticular  fashion, sterile dressing applied and the patient taken to the recovery room  in satisfactory condition.       JDL/MEDQ  D:  09/25/2004  T:  09/26/2004  Job:  [id number]

## 2010-07-14 NOTE — Op Note (Signed)
NAME:  [name] Marks, [name] Marks                         ACCOUNT NO.:  [id number]   MEDICAL RECORD NO.:  [id number]                   PATIENT TYPE:  OIB   LOCATION:  2899                                 FACILITY:  [facility]   PHYSICIAN:  Dorothea Glassman, M.D.                 DATE OF BIRTH:  03-Feb-1956   DATE OF PROCEDURE:  04/03/2002  DATE OF DISCHARGE:  04/03/2002                                 OPERATIVE REPORT   SURGEON:  Dorothea Glassman, M.D.   ASSISTANT:  Suzzanne Cloud, P.Marks.   ANESTHESIA:  Local with MAC, anesthesiologist was Leda Quail, M.D.   PREOPERATIVE DIAGNOSIS:  End-stage renal failure, clotted right forearm loop  arteriovenous graft.   POSTOPERATIVE DIAGNOSIS:  End-stage renal failure, clotted right forearm  loop arteriovenous graft.   REASON FOR ADMISSION:  Thrombectomy and revision of forearm loop  arteriovenous graft.   INDICATIONS FOR PROCEDURE:  This is Marks 55 year old black female with end-  stage renal failure.  Presented to hemodialysis today with Marks clotted right  forearm loop arteriovenous graft. Brought to the operating room at this time  for exploration.   DESCRIPTION OF PROCEDURE:  The patient was brought to the operating room in  stable condition.  Placed in the supine position.  Right arm prepped and  draped in the usual sterile fashion.  Skin and subcutaneous tissue instilled  with 1% Xylocaine with epinephrine.  Longitudinal skin incision was made  through the right antecubital fossa.  Dissection was carried down through  the subcutaneous tissue.  The venous limb of the graft followed down to and  end-to-end anastomosis to the basilic vein. The vein was further exposed  proximally and mobilized and controlled with Marks Gregory clamp. The venous  anastomosis was taken down.  The graft divided transversely. The graft  thrombectomized several times with Marks 5 Fogarty catheter. Excellent inflow  obtained.  Graft filled with heparin and saline solution and controlled with  Marks  fistula clamp.   Marks new segment of 6 mm Gore-Tex was anastomosed to the divided graft using  running 6-0 Prolene suture.  This was then anastomosed end-to-end to the  outflow vein with running 6-0 Prolene suture.  Clamps were then removed.  Excellent flow present. Adequate hemostasis obtained.  Needle, sponge, and  instrument count correct.   Subcutaneous tissue closed in two layers with running 3-0 Vicryl suture.  Skin closed with 4-0 Monocryl.  Half inch Steri-Strips applied. The patient  tolerated the procedure well.  Transferred to the recovery room in stable  condition.  No apparent complications.                                               Dorothea Glassman, M.D.    PGH/MEDQ  D:  04/03/2002  T:  04/03/2002  Job:  [id number]

## 2010-11-15 ENCOUNTER — Other Ambulatory Visit: Payer: Self-pay | Admitting: Nephrology

## 2010-11-15 DIAGNOSIS — Z1231 Encounter for screening mammogram for malignant neoplasm of breast: Secondary | ICD-10-CM

## 2010-11-28 LAB — DIFFERENTIAL
Eosinophils Relative: 6 — ABNORMAL HIGH
Lymphocytes Relative: 12
Lymphs Abs: 1
Monocytes Absolute: 0.5
Neutro Abs: 6.3

## 2010-11-28 LAB — POCT I-STAT, CHEM 8
BUN: 17
Calcium, Ion: 1.07 — ABNORMAL LOW
Chloride: 103
Glucose, Bld: 98
TCO2: 27

## 2010-11-28 LAB — CBC
HCT: 37.2
Hemoglobin: 11.8 — ABNORMAL LOW
Platelets: 258
WBC: 8.4

## 2010-12-01 ENCOUNTER — Ambulatory Visit
Admission: RE | Admit: 2010-12-01 | Discharge: 2010-12-01 | Disposition: A | Discharge status: Home / Self Care | Payer: Medicare Other | Source: Ambulatory Visit | Attending: Nephrology | Admitting: Nephrology

## 2010-12-01 ENCOUNTER — Other Ambulatory Visit: Payer: Self-pay | Admitting: Nephrology

## 2010-12-01 DIAGNOSIS — R0602 Shortness of breath: Secondary | ICD-10-CM

## 2010-12-01 LAB — POCT I-STAT 4, (NA,K, GLUC, HGB,HCT)
Glucose, Bld: 91 mg/dL (ref 70–99)
HCT: 41 % (ref 36.0–46.0)
Hemoglobin: 13.9 g/dL (ref 12.0–15.0)
Potassium: 3.8 mEq/L (ref 3.5–5.1)
Sodium: 140 mEq/L (ref 135–145)

## 2011-01-22 ENCOUNTER — Ambulatory Visit
Admission: RE | Admit: 2011-01-22 | Discharge: 2011-01-22 | Disposition: A | Discharge status: Home / Self Care | Payer: Medicare Other | Source: Ambulatory Visit | Attending: Nephrology | Admitting: Nephrology

## 2011-01-22 DIAGNOSIS — Z1231 Encounter for screening mammogram for malignant neoplasm of breast: Secondary | ICD-10-CM

## 2011-01-29 ENCOUNTER — Other Ambulatory Visit: Payer: Self-pay | Admitting: Nephrology

## 2011-01-29 DIAGNOSIS — R928 Other abnormal and inconclusive findings on diagnostic imaging of breast: Secondary | ICD-10-CM

## 2011-02-23 ENCOUNTER — Other Ambulatory Visit: Payer: Self-pay | Admitting: Nephrology

## 2011-02-23 ENCOUNTER — Ambulatory Visit
Admission: RE | Admit: 2011-02-23 | Discharge: 2011-02-23 | Disposition: A | Discharge status: Home / Self Care | Payer: Medicare Other | Source: Ambulatory Visit | Attending: Nephrology | Admitting: Nephrology

## 2011-02-23 DIAGNOSIS — R928 Other abnormal and inconclusive findings on diagnostic imaging of breast: Secondary | ICD-10-CM

## 2011-02-27 HISTORY — PX: BREAST BIOPSY: SHX20

## 2011-03-01 DIAGNOSIS — D631 Anemia in chronic kidney disease: Secondary | ICD-10-CM | POA: Diagnosis not present

## 2011-03-01 DIAGNOSIS — D509 Iron deficiency anemia, unspecified: Secondary | ICD-10-CM | POA: Diagnosis not present

## 2011-03-01 DIAGNOSIS — I953 Hypotension of hemodialysis: Secondary | ICD-10-CM | POA: Diagnosis not present

## 2011-03-01 DIAGNOSIS — N2581 Secondary hyperparathyroidism of renal origin: Secondary | ICD-10-CM | POA: Diagnosis not present

## 2011-03-01 DIAGNOSIS — N186 End stage renal disease: Secondary | ICD-10-CM | POA: Diagnosis not present

## 2011-03-01 DIAGNOSIS — Z992 Dependence on renal dialysis: Secondary | ICD-10-CM | POA: Diagnosis not present

## 2011-03-05 ENCOUNTER — Ambulatory Visit
Admission: RE | Admit: 2011-03-05 | Discharge: 2011-03-05 | Disposition: A | Discharge status: Home / Self Care | Payer: Medicare Other | Source: Ambulatory Visit | Attending: Nephrology | Admitting: Nephrology

## 2011-03-05 ENCOUNTER — Other Ambulatory Visit: Payer: Self-pay | Admitting: Nephrology

## 2011-03-05 DIAGNOSIS — R921 Mammographic calcification found on diagnostic imaging of breast: Secondary | ICD-10-CM

## 2011-03-05 DIAGNOSIS — N6489 Other specified disorders of breast: Secondary | ICD-10-CM | POA: Diagnosis not present

## 2011-03-05 DIAGNOSIS — N6019 Diffuse cystic mastopathy of unspecified breast: Secondary | ICD-10-CM | POA: Diagnosis not present

## 2011-03-05 DIAGNOSIS — R928 Other abnormal and inconclusive findings on diagnostic imaging of breast: Secondary | ICD-10-CM

## 2011-03-23 ENCOUNTER — Other Ambulatory Visit: Payer: Self-pay | Admitting: Gynecology

## 2011-03-23 DIAGNOSIS — Z01419 Encounter for gynecological examination (general) (routine) without abnormal findings: Secondary | ICD-10-CM | POA: Diagnosis not present

## 2011-03-29 DIAGNOSIS — N186 End stage renal disease: Secondary | ICD-10-CM | POA: Diagnosis not present

## 2011-03-31 DIAGNOSIS — I953 Hypotension of hemodialysis: Secondary | ICD-10-CM | POA: Diagnosis not present

## 2011-03-31 DIAGNOSIS — D509 Iron deficiency anemia, unspecified: Secondary | ICD-10-CM | POA: Diagnosis not present

## 2011-03-31 DIAGNOSIS — N2581 Secondary hyperparathyroidism of renal origin: Secondary | ICD-10-CM | POA: Diagnosis not present

## 2011-03-31 DIAGNOSIS — I959 Hypotension, unspecified: Secondary | ICD-10-CM | POA: Diagnosis not present

## 2011-03-31 DIAGNOSIS — N186 End stage renal disease: Secondary | ICD-10-CM | POA: Diagnosis not present

## 2011-03-31 DIAGNOSIS — D631 Anemia in chronic kidney disease: Secondary | ICD-10-CM | POA: Diagnosis not present

## 2011-04-26 DIAGNOSIS — D509 Iron deficiency anemia, unspecified: Secondary | ICD-10-CM | POA: Diagnosis not present

## 2011-04-26 DIAGNOSIS — N2581 Secondary hyperparathyroidism of renal origin: Secondary | ICD-10-CM | POA: Diagnosis not present

## 2011-04-26 DIAGNOSIS — N186 End stage renal disease: Secondary | ICD-10-CM | POA: Diagnosis not present

## 2011-04-28 DIAGNOSIS — N2581 Secondary hyperparathyroidism of renal origin: Secondary | ICD-10-CM | POA: Diagnosis not present

## 2011-04-28 DIAGNOSIS — N186 End stage renal disease: Secondary | ICD-10-CM | POA: Diagnosis not present

## 2011-04-28 DIAGNOSIS — D631 Anemia in chronic kidney disease: Secondary | ICD-10-CM | POA: Diagnosis not present

## 2011-04-28 DIAGNOSIS — I959 Hypotension, unspecified: Secondary | ICD-10-CM | POA: Diagnosis not present

## 2011-04-28 DIAGNOSIS — D509 Iron deficiency anemia, unspecified: Secondary | ICD-10-CM | POA: Diagnosis not present

## 2011-04-28 DIAGNOSIS — N039 Chronic nephritic syndrome with unspecified morphologic changes: Secondary | ICD-10-CM | POA: Diagnosis not present

## 2011-05-01 DIAGNOSIS — D631 Anemia in chronic kidney disease: Secondary | ICD-10-CM | POA: Diagnosis not present

## 2011-05-01 DIAGNOSIS — D509 Iron deficiency anemia, unspecified: Secondary | ICD-10-CM | POA: Diagnosis not present

## 2011-05-01 DIAGNOSIS — I959 Hypotension, unspecified: Secondary | ICD-10-CM | POA: Diagnosis not present

## 2011-05-01 DIAGNOSIS — N2581 Secondary hyperparathyroidism of renal origin: Secondary | ICD-10-CM | POA: Diagnosis not present

## 2011-05-01 DIAGNOSIS — N186 End stage renal disease: Secondary | ICD-10-CM | POA: Diagnosis not present

## 2011-05-03 DIAGNOSIS — I959 Hypotension, unspecified: Secondary | ICD-10-CM | POA: Diagnosis not present

## 2011-05-03 DIAGNOSIS — D509 Iron deficiency anemia, unspecified: Secondary | ICD-10-CM | POA: Diagnosis not present

## 2011-05-03 DIAGNOSIS — D631 Anemia in chronic kidney disease: Secondary | ICD-10-CM | POA: Diagnosis not present

## 2011-05-03 DIAGNOSIS — N2581 Secondary hyperparathyroidism of renal origin: Secondary | ICD-10-CM | POA: Diagnosis not present

## 2011-05-03 DIAGNOSIS — N186 End stage renal disease: Secondary | ICD-10-CM | POA: Diagnosis not present

## 2011-05-05 DIAGNOSIS — I959 Hypotension, unspecified: Secondary | ICD-10-CM | POA: Diagnosis not present

## 2011-05-05 DIAGNOSIS — N186 End stage renal disease: Secondary | ICD-10-CM | POA: Diagnosis not present

## 2011-05-05 DIAGNOSIS — N2581 Secondary hyperparathyroidism of renal origin: Secondary | ICD-10-CM | POA: Diagnosis not present

## 2011-05-05 DIAGNOSIS — N039 Chronic nephritic syndrome with unspecified morphologic changes: Secondary | ICD-10-CM | POA: Diagnosis not present

## 2011-05-05 DIAGNOSIS — D509 Iron deficiency anemia, unspecified: Secondary | ICD-10-CM | POA: Diagnosis not present

## 2011-05-05 DIAGNOSIS — D631 Anemia in chronic kidney disease: Secondary | ICD-10-CM | POA: Diagnosis not present

## 2011-05-08 DIAGNOSIS — I959 Hypotension, unspecified: Secondary | ICD-10-CM | POA: Diagnosis not present

## 2011-05-08 DIAGNOSIS — N2581 Secondary hyperparathyroidism of renal origin: Secondary | ICD-10-CM | POA: Diagnosis not present

## 2011-05-08 DIAGNOSIS — D631 Anemia in chronic kidney disease: Secondary | ICD-10-CM | POA: Diagnosis not present

## 2011-05-08 DIAGNOSIS — D509 Iron deficiency anemia, unspecified: Secondary | ICD-10-CM | POA: Diagnosis not present

## 2011-05-08 DIAGNOSIS — N186 End stage renal disease: Secondary | ICD-10-CM | POA: Diagnosis not present

## 2011-05-10 DIAGNOSIS — N186 End stage renal disease: Secondary | ICD-10-CM | POA: Diagnosis not present

## 2011-05-10 DIAGNOSIS — I959 Hypotension, unspecified: Secondary | ICD-10-CM | POA: Diagnosis not present

## 2011-05-10 DIAGNOSIS — D631 Anemia in chronic kidney disease: Secondary | ICD-10-CM | POA: Diagnosis not present

## 2011-05-10 DIAGNOSIS — N2581 Secondary hyperparathyroidism of renal origin: Secondary | ICD-10-CM | POA: Diagnosis not present

## 2011-05-10 DIAGNOSIS — D509 Iron deficiency anemia, unspecified: Secondary | ICD-10-CM | POA: Diagnosis not present

## 2011-05-12 DIAGNOSIS — N186 End stage renal disease: Secondary | ICD-10-CM | POA: Diagnosis not present

## 2011-05-12 DIAGNOSIS — N2581 Secondary hyperparathyroidism of renal origin: Secondary | ICD-10-CM | POA: Diagnosis not present

## 2011-05-12 DIAGNOSIS — D509 Iron deficiency anemia, unspecified: Secondary | ICD-10-CM | POA: Diagnosis not present

## 2011-05-12 DIAGNOSIS — D631 Anemia in chronic kidney disease: Secondary | ICD-10-CM | POA: Diagnosis not present

## 2011-05-12 DIAGNOSIS — I959 Hypotension, unspecified: Secondary | ICD-10-CM | POA: Diagnosis not present

## 2011-05-15 DIAGNOSIS — D631 Anemia in chronic kidney disease: Secondary | ICD-10-CM | POA: Diagnosis not present

## 2011-05-15 DIAGNOSIS — D509 Iron deficiency anemia, unspecified: Secondary | ICD-10-CM | POA: Diagnosis not present

## 2011-05-15 DIAGNOSIS — I959 Hypotension, unspecified: Secondary | ICD-10-CM | POA: Diagnosis not present

## 2011-05-15 DIAGNOSIS — N186 End stage renal disease: Secondary | ICD-10-CM | POA: Diagnosis not present

## 2011-05-15 DIAGNOSIS — N2581 Secondary hyperparathyroidism of renal origin: Secondary | ICD-10-CM | POA: Diagnosis not present

## 2011-05-17 DIAGNOSIS — N186 End stage renal disease: Secondary | ICD-10-CM | POA: Diagnosis not present

## 2011-05-17 DIAGNOSIS — I959 Hypotension, unspecified: Secondary | ICD-10-CM | POA: Diagnosis not present

## 2011-05-17 DIAGNOSIS — D509 Iron deficiency anemia, unspecified: Secondary | ICD-10-CM | POA: Diagnosis not present

## 2011-05-17 DIAGNOSIS — D631 Anemia in chronic kidney disease: Secondary | ICD-10-CM | POA: Diagnosis not present

## 2011-05-17 DIAGNOSIS — N2581 Secondary hyperparathyroidism of renal origin: Secondary | ICD-10-CM | POA: Diagnosis not present

## 2011-05-19 DIAGNOSIS — I959 Hypotension, unspecified: Secondary | ICD-10-CM | POA: Diagnosis not present

## 2011-05-19 DIAGNOSIS — D509 Iron deficiency anemia, unspecified: Secondary | ICD-10-CM | POA: Diagnosis not present

## 2011-05-19 DIAGNOSIS — N186 End stage renal disease: Secondary | ICD-10-CM | POA: Diagnosis not present

## 2011-05-19 DIAGNOSIS — D631 Anemia in chronic kidney disease: Secondary | ICD-10-CM | POA: Diagnosis not present

## 2011-05-19 DIAGNOSIS — N2581 Secondary hyperparathyroidism of renal origin: Secondary | ICD-10-CM | POA: Diagnosis not present

## 2011-05-22 DIAGNOSIS — I959 Hypotension, unspecified: Secondary | ICD-10-CM | POA: Diagnosis not present

## 2011-05-22 DIAGNOSIS — N039 Chronic nephritic syndrome with unspecified morphologic changes: Secondary | ICD-10-CM | POA: Diagnosis not present

## 2011-05-22 DIAGNOSIS — N2581 Secondary hyperparathyroidism of renal origin: Secondary | ICD-10-CM | POA: Diagnosis not present

## 2011-05-22 DIAGNOSIS — N186 End stage renal disease: Secondary | ICD-10-CM | POA: Diagnosis not present

## 2011-05-22 DIAGNOSIS — D509 Iron deficiency anemia, unspecified: Secondary | ICD-10-CM | POA: Diagnosis not present

## 2011-05-22 DIAGNOSIS — D631 Anemia in chronic kidney disease: Secondary | ICD-10-CM | POA: Diagnosis not present

## 2011-05-23 ENCOUNTER — Emergency Department (HOSPITAL_COMMUNITY)
Admission: EM | Admit: 2011-05-23 | Discharge: 2011-05-23 | Disposition: A | Discharge status: Home / Self Care | Payer: Medicare Other | Attending: Emergency Medicine | Admitting: Emergency Medicine

## 2011-05-23 ENCOUNTER — Emergency Department (HOSPITAL_COMMUNITY): Payer: Medicare Other

## 2011-05-23 ENCOUNTER — Encounter (HOSPITAL_COMMUNITY): Payer: Self-pay | Admitting: *Deleted

## 2011-05-23 DIAGNOSIS — W101XXA Fall (on)(from) sidewalk curb, initial encounter: Secondary | ICD-10-CM | POA: Insufficient documentation

## 2011-05-23 DIAGNOSIS — M25579 Pain in unspecified ankle and joints of unspecified foot: Secondary | ICD-10-CM | POA: Diagnosis not present

## 2011-05-23 DIAGNOSIS — S92309A Fracture of unspecified metatarsal bone(s), unspecified foot, initial encounter for closed fracture: Secondary | ICD-10-CM | POA: Diagnosis not present

## 2011-05-23 DIAGNOSIS — R269 Unspecified abnormalities of gait and mobility: Secondary | ICD-10-CM | POA: Diagnosis not present

## 2011-05-23 DIAGNOSIS — S99929A Unspecified injury of unspecified foot, initial encounter: Secondary | ICD-10-CM | POA: Diagnosis not present

## 2011-05-23 DIAGNOSIS — M25473 Effusion, unspecified ankle: Secondary | ICD-10-CM | POA: Diagnosis not present

## 2011-05-23 DIAGNOSIS — M79609 Pain in unspecified limb: Secondary | ICD-10-CM | POA: Diagnosis not present

## 2011-05-23 DIAGNOSIS — M25476 Effusion, unspecified foot: Secondary | ICD-10-CM | POA: Diagnosis not present

## 2011-05-23 DIAGNOSIS — S8990XA Unspecified injury of unspecified lower leg, initial encounter: Secondary | ICD-10-CM | POA: Diagnosis not present

## 2011-05-23 MED ORDER — OXYCODONE-ACETAMINOPHEN 5-325 MG PO TABS: 1.0000 | ORAL_TABLET | ORAL | Status: AC | PRN

## 2011-05-23 MED ORDER — NEOMYCIN-POLYMYXIN-DEXAMETH 3.5-10000-0.1 OP OINT: TOPICAL_OINTMENT | Freq: Three times a day (TID) | OPHTHALMIC | Status: DC

## 2011-05-23 MED ORDER — ACETAMINOPHEN 325 MG PO TABS
650.0000 mg | ORAL_TABLET | Freq: Once | ORAL | Status: AC
  Administered 2011-05-23: 650 mg via ORAL
  Filled 2011-05-23: qty 2

## 2011-05-23 NOTE — ED Notes (Signed)
Per [facility]ar, "pt stepped upon a curb, lost her balance & twisted her ankle, has a graft in left arm, end-stage renal disease"

## 2011-05-23 NOTE — Discharge Instructions (Signed)
[name] Marks you have a non-displaced fracture of the fifth metatarsal bone. Followup with orthopedic doctor below or any orthopedic doctor of your choice. Take the Percocet for extreme pain. Take Tylenol for pain otherwise. Remember that Percocet has 325 mg of Tylenol in each pill. Be careful to take too much Tylenol. Ice and elevate the foot tonight.

## 2011-05-23 NOTE — ED Provider Notes (Signed)
History     CSN: [id number]  Arrival date & time 05/23/11  72   First MD Initiated Contact with Patient 05/23/11 1324      Chief Complaint  Patient presents with  . Ankle Pain    (Consider location/radiation/quality/duration/timing/severity/associated sxs/prior treatment) Patient is a 56 y.o. female presenting with ankle pain. The history is provided by the patient. No language interpreter was used.  Ankle Pain  The incident occurred 6 to 12 hours ago. The injury mechanism was a fall. The pain is present in the left foot and left ankle. The pain is at a severity of 5/10. The pain is moderate. The pain has been constant since onset. Associated symptoms include inability to bear weight and loss of motion. Pertinent negatives include no numbness and no muscle weakness. She reports no foreign bodies present. The symptoms are aggravated by activity and bearing weight. She has tried nothing for the symptoms.   Patient tripped on the curb and twisted her ankle hurting her foot this morning.  Good sensation and motor movement to the L toes and foot.  Swelling noted to the L lateral mid foot and ankle.  Patient is a renal dialysis patient with a graft in her left upper arm with a positive bruit and thrill. No other medical history. Patient was with her son. Difficulty bearing weight on her left foot. Past Medical History  Diagnosis Date  . Renal disorder     Past Surgical History  Procedure Date  . Dg av dialysis graft declot or     No family history on file.  History  Substance Use Topics  . Smoking status: Never Smoker   . Smokeless tobacco: Not on file  . Alcohol Use: No    OB History    Grav Para Term Preterm Abortions TAB SAB Ect Mult Living                  Review of Systems  Constitutional: Negative.  Negative for fever.  HENT: Negative.   Eyes: Negative.   Respiratory: Negative.   Cardiovascular: Negative.   Gastrointestinal: Negative.  Negative for nausea and  vomiting.  Musculoskeletal: Positive for gait problem. Negative for back pain.       L foot/ankle pain  Neurological: Negative.  Negative for light-headedness and numbness.  Psychiatric/Behavioral: Negative.   All other systems reviewed and are negative.    Allergies  Ancef  Home Medications   Current Outpatient Rx  Name Route Sig Dispense Refill  . RENA-VITE PO Oral Take 1 tablet by mouth daily.      Marland Kitchen CALCIUM CARBONATE 600 MG PO TABS Oral Take 1,200 mg by mouth. With meals daily (prescribed by renal md)     . FAMOTIDINE 10 MG PO TABS  NO INSTRUCTIONS GIVEN. Unsure if this is correct dosage given to her by renal md.     . SEVELAMER CARBONATE 800 MG PO TABS Oral Take 1,600 mg by mouth. Every meal (prescribed by renal md)     . TRAMADOL HCL 50 MG PO TABS Oral Take 50 mg by mouth every 6 (six) hours as needed. For pain       BP 130/40  Pulse 70  Temp(Src) 97.8 F (36.6 C) (Oral)  Resp 16  SpO2 99%  Physical Exam  Nursing note and vitals reviewed. Constitutional: She is oriented to person, place, and time. She appears well-developed and well-nourished.  HENT:  Head: Normocephalic and atraumatic.  Eyes: Conjunctivae and EOM are normal. Pupils  are equal, round, and reactive to light.  Neck: Normal range of motion. Neck supple.  Cardiovascular: Normal rate, regular rhythm, normal heart sounds and intact distal pulses.  Exam reveals no gallop and no friction rub.   No murmur heard. Pulmonary/Chest: Effort normal and breath sounds normal.  Abdominal: Soft. Bowel sounds are normal.  Musculoskeletal: Normal range of motion. She exhibits edema and tenderness.       L ankle/foot tenderness with good sensation and movment below injury.  2+ pedal pulse  Dialysis graft with positive bruit and thrill  Neurological: She is alert and oriented to person, place, and time. She has normal reflexes.  Skin: Skin is warm and dry.  Psychiatric: She has a normal mood and affect.    ED Course    Procedures (including critical care time)  Labs Reviewed - No data to display Dg Ankle Complete Left  05/23/2011  *RADIOLOGY REPORT*  Clinical Data: Left lateral ankle and foot pain.  LEFT ANKLE COMPLETE - 3+ VIEW  Comparison: 12/06/2008.  Findings: A nondisplaced lucency is seen at the base of the fifth metatarsal.  Ankle joint is intact.  No additional evidence of acute fracture. Prominent calcaneal spurs.  IMPRESSION: Nondisplaced fracture at the base of the fifth metatarsal.  Original Report Authenticated By: Luretha Rued, M.D.     No diagnosis found.    MDM  Fx 5th metatarsal nondisplaced.  Post op shoe with Ortho consult.  [facility]. Percocet prescription. Advised patient to take only if she is going to be lying in the bed. Patient always is also advised to keep it iced and elevated for 24 hours.        Sheryle Hail, NP 05/23/11 1531

## 2011-05-23 NOTE — Progress Notes (Signed)
ED CM contacted by ED FNP to assist with DME.  CM reviewed EPIC and spoke with pt Ht 5'1" wt 220 lbs Medicare and BCBS coverage.  [facility] chose [facility] to provide walker and she has not had a RW in last 5 years.  Pt provided with advance contact information .  CM spoke with [facility] home care staff to complete referral.

## 2011-05-24 DIAGNOSIS — D509 Iron deficiency anemia, unspecified: Secondary | ICD-10-CM | POA: Diagnosis not present

## 2011-05-24 DIAGNOSIS — I959 Hypotension, unspecified: Secondary | ICD-10-CM | POA: Diagnosis not present

## 2011-05-24 DIAGNOSIS — D631 Anemia in chronic kidney disease: Secondary | ICD-10-CM | POA: Diagnosis not present

## 2011-05-24 DIAGNOSIS — N2581 Secondary hyperparathyroidism of renal origin: Secondary | ICD-10-CM | POA: Diagnosis not present

## 2011-05-24 DIAGNOSIS — N186 End stage renal disease: Secondary | ICD-10-CM | POA: Diagnosis not present

## 2011-05-24 DIAGNOSIS — N039 Chronic nephritic syndrome with unspecified morphologic changes: Secondary | ICD-10-CM | POA: Diagnosis not present

## 2011-05-24 NOTE — ED Provider Notes (Signed)
Medical screening examination/treatment/procedure(s) were performed by non-physician practitioner and as supervising physician I was immediately available for consultation/collaboration.  Chauncy Passy, MD 05/24/11 [phone]

## 2011-05-27 DIAGNOSIS — N186 End stage renal disease: Secondary | ICD-10-CM | POA: Diagnosis not present

## 2011-05-28 ENCOUNTER — Ambulatory Visit: Payer: Self-pay | Admitting: Vascular Surgery

## 2011-05-28 DIAGNOSIS — N186 End stage renal disease: Secondary | ICD-10-CM | POA: Diagnosis not present

## 2011-05-28 DIAGNOSIS — Z992 Dependence on renal dialysis: Secondary | ICD-10-CM | POA: Diagnosis not present

## 2011-05-28 DIAGNOSIS — T82898A Other specified complication of vascular prosthetic devices, implants and grafts, initial encounter: Secondary | ICD-10-CM | POA: Diagnosis not present

## 2011-05-28 LAB — POTASSIUM: Potassium: 4.2 mmol/L (ref 3.5–5.1)

## 2011-05-29 ENCOUNTER — Other Ambulatory Visit (HOSPITAL_COMMUNITY): Payer: Medicare Other

## 2011-05-29 ENCOUNTER — Ambulatory Visit: Payer: Self-pay | Admitting: Vascular Surgery

## 2011-05-29 ENCOUNTER — Other Ambulatory Visit (HOSPITAL_COMMUNITY): Payer: Self-pay | Admitting: Nephrology

## 2011-05-29 DIAGNOSIS — Z992 Dependence on renal dialysis: Secondary | ICD-10-CM | POA: Diagnosis not present

## 2011-05-29 DIAGNOSIS — I10 Essential (primary) hypertension: Secondary | ICD-10-CM | POA: Diagnosis not present

## 2011-05-29 DIAGNOSIS — T82898A Other specified complication of vascular prosthetic devices, implants and grafts, initial encounter: Secondary | ICD-10-CM | POA: Diagnosis not present

## 2011-05-29 DIAGNOSIS — N186 End stage renal disease: Secondary | ICD-10-CM

## 2011-05-29 DIAGNOSIS — I871 Compression of vein: Secondary | ICD-10-CM | POA: Diagnosis not present

## 2011-05-29 LAB — POTASSIUM: Potassium: 5.1 mmol/L (ref 3.5–5.1)

## 2011-05-30 DIAGNOSIS — I959 Hypotension, unspecified: Secondary | ICD-10-CM | POA: Diagnosis not present

## 2011-05-30 DIAGNOSIS — D631 Anemia in chronic kidney disease: Secondary | ICD-10-CM | POA: Diagnosis not present

## 2011-05-30 DIAGNOSIS — D509 Iron deficiency anemia, unspecified: Secondary | ICD-10-CM | POA: Diagnosis not present

## 2011-05-30 DIAGNOSIS — N039 Chronic nephritic syndrome with unspecified morphologic changes: Secondary | ICD-10-CM | POA: Diagnosis not present

## 2011-05-30 DIAGNOSIS — N186 End stage renal disease: Secondary | ICD-10-CM | POA: Diagnosis not present

## 2011-05-30 DIAGNOSIS — N2581 Secondary hyperparathyroidism of renal origin: Secondary | ICD-10-CM | POA: Diagnosis not present

## 2011-05-30 DIAGNOSIS — T82898A Other specified complication of vascular prosthetic devices, implants and grafts, initial encounter: Secondary | ICD-10-CM | POA: Diagnosis not present

## 2011-05-31 DIAGNOSIS — T82898A Other specified complication of vascular prosthetic devices, implants and grafts, initial encounter: Secondary | ICD-10-CM | POA: Diagnosis not present

## 2011-05-31 DIAGNOSIS — N039 Chronic nephritic syndrome with unspecified morphologic changes: Secondary | ICD-10-CM | POA: Diagnosis not present

## 2011-05-31 DIAGNOSIS — N2581 Secondary hyperparathyroidism of renal origin: Secondary | ICD-10-CM | POA: Diagnosis not present

## 2011-05-31 DIAGNOSIS — D509 Iron deficiency anemia, unspecified: Secondary | ICD-10-CM | POA: Diagnosis not present

## 2011-05-31 DIAGNOSIS — I959 Hypotension, unspecified: Secondary | ICD-10-CM | POA: Diagnosis not present

## 2011-05-31 DIAGNOSIS — D631 Anemia in chronic kidney disease: Secondary | ICD-10-CM | POA: Diagnosis not present

## 2011-05-31 DIAGNOSIS — N186 End stage renal disease: Secondary | ICD-10-CM | POA: Diagnosis not present

## 2011-06-01 DIAGNOSIS — S92309A Fracture of unspecified metatarsal bone(s), unspecified foot, initial encounter for closed fracture: Secondary | ICD-10-CM | POA: Diagnosis not present

## 2011-06-02 DIAGNOSIS — T82898A Other specified complication of vascular prosthetic devices, implants and grafts, initial encounter: Secondary | ICD-10-CM | POA: Diagnosis not present

## 2011-06-02 DIAGNOSIS — N2581 Secondary hyperparathyroidism of renal origin: Secondary | ICD-10-CM | POA: Diagnosis not present

## 2011-06-02 DIAGNOSIS — I959 Hypotension, unspecified: Secondary | ICD-10-CM | POA: Diagnosis not present

## 2011-06-02 DIAGNOSIS — N186 End stage renal disease: Secondary | ICD-10-CM | POA: Diagnosis not present

## 2011-06-02 DIAGNOSIS — D631 Anemia in chronic kidney disease: Secondary | ICD-10-CM | POA: Diagnosis not present

## 2011-06-02 DIAGNOSIS — D509 Iron deficiency anemia, unspecified: Secondary | ICD-10-CM | POA: Diagnosis not present

## 2011-06-05 DIAGNOSIS — T82898A Other specified complication of vascular prosthetic devices, implants and grafts, initial encounter: Secondary | ICD-10-CM | POA: Diagnosis not present

## 2011-06-05 DIAGNOSIS — D631 Anemia in chronic kidney disease: Secondary | ICD-10-CM | POA: Diagnosis not present

## 2011-06-05 DIAGNOSIS — D509 Iron deficiency anemia, unspecified: Secondary | ICD-10-CM | POA: Diagnosis not present

## 2011-06-05 DIAGNOSIS — N186 End stage renal disease: Secondary | ICD-10-CM | POA: Diagnosis not present

## 2011-06-05 DIAGNOSIS — I959 Hypotension, unspecified: Secondary | ICD-10-CM | POA: Diagnosis not present

## 2011-06-05 DIAGNOSIS — N2581 Secondary hyperparathyroidism of renal origin: Secondary | ICD-10-CM | POA: Diagnosis not present

## 2011-06-07 DIAGNOSIS — D631 Anemia in chronic kidney disease: Secondary | ICD-10-CM | POA: Diagnosis not present

## 2011-06-07 DIAGNOSIS — N186 End stage renal disease: Secondary | ICD-10-CM | POA: Diagnosis not present

## 2011-06-07 DIAGNOSIS — D509 Iron deficiency anemia, unspecified: Secondary | ICD-10-CM | POA: Diagnosis not present

## 2011-06-07 DIAGNOSIS — N2581 Secondary hyperparathyroidism of renal origin: Secondary | ICD-10-CM | POA: Diagnosis not present

## 2011-06-07 DIAGNOSIS — I959 Hypotension, unspecified: Secondary | ICD-10-CM | POA: Diagnosis not present

## 2011-06-07 DIAGNOSIS — N039 Chronic nephritic syndrome with unspecified morphologic changes: Secondary | ICD-10-CM | POA: Diagnosis not present

## 2011-06-07 DIAGNOSIS — T82898A Other specified complication of vascular prosthetic devices, implants and grafts, initial encounter: Secondary | ICD-10-CM | POA: Diagnosis not present

## 2011-06-09 DIAGNOSIS — D509 Iron deficiency anemia, unspecified: Secondary | ICD-10-CM | POA: Diagnosis not present

## 2011-06-09 DIAGNOSIS — T82898A Other specified complication of vascular prosthetic devices, implants and grafts, initial encounter: Secondary | ICD-10-CM | POA: Diagnosis not present

## 2011-06-09 DIAGNOSIS — D631 Anemia in chronic kidney disease: Secondary | ICD-10-CM | POA: Diagnosis not present

## 2011-06-09 DIAGNOSIS — I959 Hypotension, unspecified: Secondary | ICD-10-CM | POA: Diagnosis not present

## 2011-06-09 DIAGNOSIS — N2581 Secondary hyperparathyroidism of renal origin: Secondary | ICD-10-CM | POA: Diagnosis not present

## 2011-06-09 DIAGNOSIS — N186 End stage renal disease: Secondary | ICD-10-CM | POA: Diagnosis not present

## 2011-06-12 DIAGNOSIS — I959 Hypotension, unspecified: Secondary | ICD-10-CM | POA: Diagnosis not present

## 2011-06-12 DIAGNOSIS — N2581 Secondary hyperparathyroidism of renal origin: Secondary | ICD-10-CM | POA: Diagnosis not present

## 2011-06-12 DIAGNOSIS — D509 Iron deficiency anemia, unspecified: Secondary | ICD-10-CM | POA: Diagnosis not present

## 2011-06-12 DIAGNOSIS — N039 Chronic nephritic syndrome with unspecified morphologic changes: Secondary | ICD-10-CM | POA: Diagnosis not present

## 2011-06-12 DIAGNOSIS — T82898A Other specified complication of vascular prosthetic devices, implants and grafts, initial encounter: Secondary | ICD-10-CM | POA: Diagnosis not present

## 2011-06-12 DIAGNOSIS — N186 End stage renal disease: Secondary | ICD-10-CM | POA: Diagnosis not present

## 2011-06-12 DIAGNOSIS — D631 Anemia in chronic kidney disease: Secondary | ICD-10-CM | POA: Diagnosis not present

## 2011-06-13 ENCOUNTER — Encounter (HOSPITAL_COMMUNITY): Payer: Self-pay

## 2011-06-13 ENCOUNTER — Emergency Department (INDEPENDENT_AMBULATORY_CARE_PROVIDER_SITE_OTHER)
Admission: EM | Admit: 2011-06-13 | Discharge: 2011-06-13 | Disposition: A | Discharge status: Home / Self Care | Payer: Medicare Other | Source: Home / Self Care | Attending: Emergency Medicine | Admitting: Emergency Medicine

## 2011-06-13 DIAGNOSIS — N760 Acute vaginitis: Secondary | ICD-10-CM | POA: Diagnosis not present

## 2011-06-13 HISTORY — DX: End stage renal disease: N18.6

## 2011-06-13 HISTORY — DX: Dependence on renal dialysis: Z99.2

## 2011-06-13 LAB — WET PREP, GENITAL
Clue Cells Wet Prep HPF POC: NONE SEEN
Trich, Wet Prep: NONE SEEN

## 2011-06-13 MED ORDER — FLUCONAZOLE 150 MG PO TABS: 150.0000 mg | ORAL_TABLET | Freq: Once | ORAL | Status: DC

## 2011-06-13 MED ORDER — FLUCONAZOLE 150 MG PO TABS: 150.0000 mg | ORAL_TABLET | Freq: Once | ORAL | Status: AC

## 2011-06-13 NOTE — ED Notes (Addendum)
Patient states she may have a yeast infection. C/o perineal area itching, pain, (6-8) Not sexually active for many years, no STD concerns. End stage renal disease, not able to give a UA sample,( no pain w urination) Disclosed she had antibiotics after surgery the first of month

## 2011-06-13 NOTE — Discharge Instructions (Signed)
We will contact you if any further treatment will be necessary. Only if abnormal test results the

## 2011-06-13 NOTE — ED Provider Notes (Addendum)
History     CSN: [id number]  Arrival date & time 06/13/11  [id number]   First MD Initiated Contact with Patient 06/13/11 1122      Chief Complaint  Patient presents with  . Vaginitis    (Consider location/radiation/quality/duration/timing/severity/associated sxs/prior treatment) HPI Comments: I think I have the infection" been having itchiness and genitalia air have a discrete discharge. Have not seen any rashes and have not noticed any swelling. No fevers and no other symptoms. Described that she recently had surgery and was given antibiotics.  Patient denies any fevers, nausea vomiting, and no abdominal pain.  Patient is a 56 y.o. female presenting with vaginal itching. The history is provided by the patient.  Vaginal Itching This is a new problem. The current episode started more than 1 week ago. The problem occurs constantly. The problem has not changed since onset.Pertinent negatives include no chest pain and no abdominal pain. The symptoms are aggravated by nothing. The symptoms are relieved by nothing.    Past Medical History  Diagnosis Date  . Renal disorder   . End stage renal disease on dialysis   . End stage renal disease on dialysis     Past Surgical History  Procedure Date  . Dg av dialysis graft declot or     History reviewed. No pertinent family history.  History  Substance Use Topics  . Smoking status: Never Smoker   . Smokeless tobacco: Not on file  . Alcohol Use: No    OB History    Grav Para Term Preterm Abortions TAB SAB Ect Mult Living                  Review of Systems  Constitutional: Negative for fever and chills.  Cardiovascular: Negative for chest pain.  Gastrointestinal: Negative for abdominal pain.  Genitourinary: Positive for dysuria, frequency and vaginal discharge. Negative for urgency, hematuria, flank pain, decreased urine volume, vaginal bleeding, enuresis, genital sores, menstrual problem and pelvic pain.    Allergies  Ancef and  Contrast media  Home Medications   Current Outpatient Rx  Name Route Sig Dispense Refill  . CINACALCET HCL 30 MG PO TABS Oral Take 30 mg by mouth daily.    Marland Kitchen RENA-VITE PO Oral Take 1 tablet by mouth daily.      Marland Kitchen CALCIUM CARBONATE 600 MG PO TABS Oral Take 1,200 mg by mouth. With meals daily (prescribed by renal md)     . FAMOTIDINE 10 MG PO TABS  NO INSTRUCTIONS GIVEN. Unsure if this is correct dosage given to her by renal md.     . FLUCONAZOLE 150 MG PO TABS Oral Take 1 tablet (150 mg total) by mouth once. Repeat treatment in 7 days 2 tablet 2  . SEVELAMER CARBONATE 800 MG PO TABS Oral Take 1,600 mg by mouth. Every meal (prescribed by renal md)       BP 107/65  Pulse 75  Temp(Src) 98.1 F (36.7 C) (Oral)  Resp 16  SpO2 99%  Physical Exam  Nursing note and vitals reviewed. Constitutional: She appears well-developed and well-nourished. No distress.  Abdominal: Soft. There is no tenderness.  Genitourinary: There is no rash, tenderness or lesion on the right labia. There is no rash, tenderness or lesion on the left labia. No erythema or bleeding around the vagina. No foreign body around the vagina. Vaginal discharge found.  Skin: No rash noted. No erythema.    ED Course  Procedures (including critical care time)  Labs Reviewed  WET  PREP, GENITAL - Abnormal; Notable for the following:    Yeast Wet Prep HPF POC FEW (*)    WBC, Wet Prep HPF POC FEW (*)    All other components within normal limits  GC/CHLAMYDIA PROBE AMP, GENITAL   No results found.   1. Vaginitis       MDM  Patient presented with vaginal irritation. No observable external genitalia erythema or signs of active infection were noted. Patient is anuric- afebrile looks comfortable. Has knowledge the presence of a needle vaginal discharge. Have discussed prons and cons of empiric oral treatment for a vaginal yeast infection patient agreed to treat prior wet prep results. We will contact her if further treatment is  necessary when results become available patient agree with treatment plan and followup care as necessary        Rosana Hoes, MD 06/13/11 1345  Rosana Hoes, MD 06/13/11 1346

## 2011-06-14 DIAGNOSIS — D509 Iron deficiency anemia, unspecified: Secondary | ICD-10-CM | POA: Diagnosis not present

## 2011-06-14 DIAGNOSIS — N186 End stage renal disease: Secondary | ICD-10-CM | POA: Diagnosis not present

## 2011-06-14 DIAGNOSIS — I959 Hypotension, unspecified: Secondary | ICD-10-CM | POA: Diagnosis not present

## 2011-06-14 DIAGNOSIS — T82898A Other specified complication of vascular prosthetic devices, implants and grafts, initial encounter: Secondary | ICD-10-CM | POA: Diagnosis not present

## 2011-06-14 DIAGNOSIS — N2581 Secondary hyperparathyroidism of renal origin: Secondary | ICD-10-CM | POA: Diagnosis not present

## 2011-06-14 DIAGNOSIS — D631 Anemia in chronic kidney disease: Secondary | ICD-10-CM | POA: Diagnosis not present

## 2011-06-16 DIAGNOSIS — N186 End stage renal disease: Secondary | ICD-10-CM | POA: Diagnosis not present

## 2011-06-16 DIAGNOSIS — T82898A Other specified complication of vascular prosthetic devices, implants and grafts, initial encounter: Secondary | ICD-10-CM | POA: Diagnosis not present

## 2011-06-16 DIAGNOSIS — I959 Hypotension, unspecified: Secondary | ICD-10-CM | POA: Diagnosis not present

## 2011-06-16 DIAGNOSIS — N2581 Secondary hyperparathyroidism of renal origin: Secondary | ICD-10-CM | POA: Diagnosis not present

## 2011-06-16 DIAGNOSIS — D509 Iron deficiency anemia, unspecified: Secondary | ICD-10-CM | POA: Diagnosis not present

## 2011-06-16 DIAGNOSIS — D631 Anemia in chronic kidney disease: Secondary | ICD-10-CM | POA: Diagnosis not present

## 2011-06-18 DIAGNOSIS — S92309A Fracture of unspecified metatarsal bone(s), unspecified foot, initial encounter for closed fracture: Secondary | ICD-10-CM | POA: Diagnosis not present

## 2011-06-19 DIAGNOSIS — N039 Chronic nephritic syndrome with unspecified morphologic changes: Secondary | ICD-10-CM | POA: Diagnosis not present

## 2011-06-19 DIAGNOSIS — N186 End stage renal disease: Secondary | ICD-10-CM | POA: Diagnosis not present

## 2011-06-19 DIAGNOSIS — D631 Anemia in chronic kidney disease: Secondary | ICD-10-CM | POA: Diagnosis not present

## 2011-06-19 DIAGNOSIS — T82898A Other specified complication of vascular prosthetic devices, implants and grafts, initial encounter: Secondary | ICD-10-CM | POA: Diagnosis not present

## 2011-06-19 DIAGNOSIS — N2581 Secondary hyperparathyroidism of renal origin: Secondary | ICD-10-CM | POA: Diagnosis not present

## 2011-06-19 DIAGNOSIS — I959 Hypotension, unspecified: Secondary | ICD-10-CM | POA: Diagnosis not present

## 2011-06-19 DIAGNOSIS — D509 Iron deficiency anemia, unspecified: Secondary | ICD-10-CM | POA: Diagnosis not present

## 2011-06-21 DIAGNOSIS — N2581 Secondary hyperparathyroidism of renal origin: Secondary | ICD-10-CM | POA: Diagnosis not present

## 2011-06-21 DIAGNOSIS — D509 Iron deficiency anemia, unspecified: Secondary | ICD-10-CM | POA: Diagnosis not present

## 2011-06-21 DIAGNOSIS — T82898A Other specified complication of vascular prosthetic devices, implants and grafts, initial encounter: Secondary | ICD-10-CM | POA: Diagnosis not present

## 2011-06-21 DIAGNOSIS — N186 End stage renal disease: Secondary | ICD-10-CM | POA: Diagnosis not present

## 2011-06-21 DIAGNOSIS — I959 Hypotension, unspecified: Secondary | ICD-10-CM | POA: Diagnosis not present

## 2011-06-21 DIAGNOSIS — D631 Anemia in chronic kidney disease: Secondary | ICD-10-CM | POA: Diagnosis not present

## 2011-06-22 DIAGNOSIS — T82898A Other specified complication of vascular prosthetic devices, implants and grafts, initial encounter: Secondary | ICD-10-CM | POA: Diagnosis not present

## 2011-06-22 DIAGNOSIS — N186 End stage renal disease: Secondary | ICD-10-CM | POA: Diagnosis not present

## 2011-06-22 DIAGNOSIS — T859XXA Unspecified complication of internal prosthetic device, implant and graft, initial encounter: Secondary | ICD-10-CM | POA: Diagnosis not present

## 2011-06-23 DIAGNOSIS — D631 Anemia in chronic kidney disease: Secondary | ICD-10-CM | POA: Diagnosis not present

## 2011-06-23 DIAGNOSIS — D509 Iron deficiency anemia, unspecified: Secondary | ICD-10-CM | POA: Diagnosis not present

## 2011-06-23 DIAGNOSIS — N039 Chronic nephritic syndrome with unspecified morphologic changes: Secondary | ICD-10-CM | POA: Diagnosis not present

## 2011-06-23 DIAGNOSIS — I959 Hypotension, unspecified: Secondary | ICD-10-CM | POA: Diagnosis not present

## 2011-06-23 DIAGNOSIS — N186 End stage renal disease: Secondary | ICD-10-CM | POA: Diagnosis not present

## 2011-06-23 DIAGNOSIS — T82898A Other specified complication of vascular prosthetic devices, implants and grafts, initial encounter: Secondary | ICD-10-CM | POA: Diagnosis not present

## 2011-06-23 DIAGNOSIS — N2581 Secondary hyperparathyroidism of renal origin: Secondary | ICD-10-CM | POA: Diagnosis not present

## 2011-06-26 DIAGNOSIS — D509 Iron deficiency anemia, unspecified: Secondary | ICD-10-CM | POA: Diagnosis not present

## 2011-06-26 DIAGNOSIS — D631 Anemia in chronic kidney disease: Secondary | ICD-10-CM | POA: Diagnosis not present

## 2011-06-26 DIAGNOSIS — N039 Chronic nephritic syndrome with unspecified morphologic changes: Secondary | ICD-10-CM | POA: Diagnosis not present

## 2011-06-26 DIAGNOSIS — T82898A Other specified complication of vascular prosthetic devices, implants and grafts, initial encounter: Secondary | ICD-10-CM | POA: Diagnosis not present

## 2011-06-26 DIAGNOSIS — N186 End stage renal disease: Secondary | ICD-10-CM | POA: Diagnosis not present

## 2011-06-26 DIAGNOSIS — I959 Hypotension, unspecified: Secondary | ICD-10-CM | POA: Diagnosis not present

## 2011-06-26 DIAGNOSIS — N2581 Secondary hyperparathyroidism of renal origin: Secondary | ICD-10-CM | POA: Diagnosis not present

## 2011-06-27 ENCOUNTER — Ambulatory Visit: Payer: Self-pay | Admitting: Vascular Surgery

## 2011-06-27 DIAGNOSIS — Z992 Dependence on renal dialysis: Secondary | ICD-10-CM | POA: Diagnosis not present

## 2011-06-27 DIAGNOSIS — Z79899 Other long term (current) drug therapy: Secondary | ICD-10-CM | POA: Diagnosis not present

## 2011-06-27 DIAGNOSIS — N186 End stage renal disease: Secondary | ICD-10-CM | POA: Diagnosis not present

## 2011-06-27 DIAGNOSIS — Z9889 Other specified postprocedural states: Secondary | ICD-10-CM | POA: Diagnosis not present

## 2011-06-27 DIAGNOSIS — T82898A Other specified complication of vascular prosthetic devices, implants and grafts, initial encounter: Secondary | ICD-10-CM | POA: Diagnosis not present

## 2011-06-28 DIAGNOSIS — I959 Hypotension, unspecified: Secondary | ICD-10-CM | POA: Diagnosis not present

## 2011-06-28 DIAGNOSIS — D631 Anemia in chronic kidney disease: Secondary | ICD-10-CM | POA: Diagnosis not present

## 2011-06-28 DIAGNOSIS — N2581 Secondary hyperparathyroidism of renal origin: Secondary | ICD-10-CM | POA: Diagnosis not present

## 2011-06-28 DIAGNOSIS — D509 Iron deficiency anemia, unspecified: Secondary | ICD-10-CM | POA: Diagnosis not present

## 2011-06-28 DIAGNOSIS — N186 End stage renal disease: Secondary | ICD-10-CM | POA: Diagnosis not present

## 2011-06-29 DIAGNOSIS — I10 Essential (primary) hypertension: Secondary | ICD-10-CM | POA: Diagnosis not present

## 2011-06-29 DIAGNOSIS — Z01810 Encounter for preprocedural cardiovascular examination: Secondary | ICD-10-CM | POA: Diagnosis not present

## 2011-06-29 DIAGNOSIS — N19 Unspecified kidney failure: Secondary | ICD-10-CM | POA: Diagnosis not present

## 2011-06-29 DIAGNOSIS — T82898A Other specified complication of vascular prosthetic devices, implants and grafts, initial encounter: Secondary | ICD-10-CM | POA: Diagnosis not present

## 2011-06-29 DIAGNOSIS — N186 End stage renal disease: Secondary | ICD-10-CM | POA: Diagnosis not present

## 2011-07-04 ENCOUNTER — Ambulatory Visit: Payer: Self-pay | Admitting: Vascular Surgery

## 2011-07-04 DIAGNOSIS — Z01812 Encounter for preprocedural laboratory examination: Secondary | ICD-10-CM | POA: Diagnosis not present

## 2011-07-04 DIAGNOSIS — Z01810 Encounter for preprocedural cardiovascular examination: Secondary | ICD-10-CM | POA: Diagnosis not present

## 2011-07-04 DIAGNOSIS — N19 Unspecified kidney failure: Secondary | ICD-10-CM | POA: Diagnosis not present

## 2011-07-04 LAB — CBC
HCT: 32.9 % — ABNORMAL LOW (ref 35.0–47.0)
HGB: 10.8 g/dL — ABNORMAL LOW (ref 12.0–16.0)
MCH: 32.5 pg (ref 26.0–34.0)
MCHC: 32.8 g/dL (ref 32.0–36.0)
Platelet: 260 10*3/uL (ref 150–440)
RBC: 3.33 10*6/uL — ABNORMAL LOW (ref 3.80–5.20)
WBC: 5.4 10*3/uL (ref 3.6–11.0)

## 2011-07-04 LAB — BASIC METABOLIC PANEL
BUN: 18 mg/dL (ref 7–18)
Calcium, Total: 10.2 mg/dL — ABNORMAL HIGH (ref 8.5–10.1)
Chloride: 103 mmol/L (ref 98–107)
Creatinine: 6.46 mg/dL — ABNORMAL HIGH (ref 0.60–1.30)
EGFR (African American): 8 — ABNORMAL LOW
Osmolality: 277 (ref 275–301)
Potassium: 5 mmol/L (ref 3.5–5.1)
Sodium: 138 mmol/L (ref 136–145)

## 2011-07-11 ENCOUNTER — Ambulatory Visit: Payer: Self-pay | Admitting: Vascular Surgery

## 2011-07-11 DIAGNOSIS — N186 End stage renal disease: Secondary | ICD-10-CM | POA: Diagnosis not present

## 2011-07-11 DIAGNOSIS — Z992 Dependence on renal dialysis: Secondary | ICD-10-CM | POA: Diagnosis not present

## 2011-07-11 DIAGNOSIS — Z79899 Other long term (current) drug therapy: Secondary | ICD-10-CM | POA: Diagnosis not present

## 2011-07-11 DIAGNOSIS — I120 Hypertensive chronic kidney disease with stage 5 chronic kidney disease or end stage renal disease: Secondary | ICD-10-CM | POA: Diagnosis not present

## 2011-07-11 DIAGNOSIS — K219 Gastro-esophageal reflux disease without esophagitis: Secondary | ICD-10-CM | POA: Diagnosis not present

## 2011-07-11 LAB — POTASSIUM: Potassium: 4.6 mmol/L (ref 3.5–5.1)

## 2011-07-16 DIAGNOSIS — S92309A Fracture of unspecified metatarsal bone(s), unspecified foot, initial encounter for closed fracture: Secondary | ICD-10-CM | POA: Diagnosis not present

## 2011-07-27 DIAGNOSIS — N186 End stage renal disease: Secondary | ICD-10-CM | POA: Diagnosis not present

## 2011-07-28 DIAGNOSIS — I959 Hypotension, unspecified: Secondary | ICD-10-CM | POA: Diagnosis not present

## 2011-07-28 DIAGNOSIS — N2581 Secondary hyperparathyroidism of renal origin: Secondary | ICD-10-CM | POA: Diagnosis not present

## 2011-07-28 DIAGNOSIS — N039 Chronic nephritic syndrome with unspecified morphologic changes: Secondary | ICD-10-CM | POA: Diagnosis not present

## 2011-07-28 DIAGNOSIS — D631 Anemia in chronic kidney disease: Secondary | ICD-10-CM | POA: Diagnosis not present

## 2011-07-28 DIAGNOSIS — D509 Iron deficiency anemia, unspecified: Secondary | ICD-10-CM | POA: Diagnosis not present

## 2011-07-28 DIAGNOSIS — N186 End stage renal disease: Secondary | ICD-10-CM | POA: Diagnosis not present

## 2011-07-31 DIAGNOSIS — D509 Iron deficiency anemia, unspecified: Secondary | ICD-10-CM | POA: Diagnosis not present

## 2011-07-31 DIAGNOSIS — I959 Hypotension, unspecified: Secondary | ICD-10-CM | POA: Diagnosis not present

## 2011-07-31 DIAGNOSIS — N186 End stage renal disease: Secondary | ICD-10-CM | POA: Diagnosis not present

## 2011-07-31 DIAGNOSIS — N2581 Secondary hyperparathyroidism of renal origin: Secondary | ICD-10-CM | POA: Diagnosis not present

## 2011-07-31 DIAGNOSIS — D631 Anemia in chronic kidney disease: Secondary | ICD-10-CM | POA: Diagnosis not present

## 2011-08-02 DIAGNOSIS — D631 Anemia in chronic kidney disease: Secondary | ICD-10-CM | POA: Diagnosis not present

## 2011-08-02 DIAGNOSIS — I959 Hypotension, unspecified: Secondary | ICD-10-CM | POA: Diagnosis not present

## 2011-08-02 DIAGNOSIS — D509 Iron deficiency anemia, unspecified: Secondary | ICD-10-CM | POA: Diagnosis not present

## 2011-08-02 DIAGNOSIS — N2581 Secondary hyperparathyroidism of renal origin: Secondary | ICD-10-CM | POA: Diagnosis not present

## 2011-08-02 DIAGNOSIS — N186 End stage renal disease: Secondary | ICD-10-CM | POA: Diagnosis not present

## 2011-08-04 DIAGNOSIS — D509 Iron deficiency anemia, unspecified: Secondary | ICD-10-CM | POA: Diagnosis not present

## 2011-08-04 DIAGNOSIS — N186 End stage renal disease: Secondary | ICD-10-CM | POA: Diagnosis not present

## 2011-08-04 DIAGNOSIS — I959 Hypotension, unspecified: Secondary | ICD-10-CM | POA: Diagnosis not present

## 2011-08-04 DIAGNOSIS — D631 Anemia in chronic kidney disease: Secondary | ICD-10-CM | POA: Diagnosis not present

## 2011-08-04 DIAGNOSIS — N2581 Secondary hyperparathyroidism of renal origin: Secondary | ICD-10-CM | POA: Diagnosis not present

## 2011-08-07 DIAGNOSIS — I959 Hypotension, unspecified: Secondary | ICD-10-CM | POA: Diagnosis not present

## 2011-08-07 DIAGNOSIS — D509 Iron deficiency anemia, unspecified: Secondary | ICD-10-CM | POA: Diagnosis not present

## 2011-08-07 DIAGNOSIS — N2581 Secondary hyperparathyroidism of renal origin: Secondary | ICD-10-CM | POA: Diagnosis not present

## 2011-08-07 DIAGNOSIS — D631 Anemia in chronic kidney disease: Secondary | ICD-10-CM | POA: Diagnosis not present

## 2011-08-07 DIAGNOSIS — N186 End stage renal disease: Secondary | ICD-10-CM | POA: Diagnosis not present

## 2011-08-09 DIAGNOSIS — N186 End stage renal disease: Secondary | ICD-10-CM | POA: Diagnosis not present

## 2011-08-09 DIAGNOSIS — N2581 Secondary hyperparathyroidism of renal origin: Secondary | ICD-10-CM | POA: Diagnosis not present

## 2011-08-09 DIAGNOSIS — D631 Anemia in chronic kidney disease: Secondary | ICD-10-CM | POA: Diagnosis not present

## 2011-08-09 DIAGNOSIS — D509 Iron deficiency anemia, unspecified: Secondary | ICD-10-CM | POA: Diagnosis not present

## 2011-08-09 DIAGNOSIS — I959 Hypotension, unspecified: Secondary | ICD-10-CM | POA: Diagnosis not present

## 2011-08-11 DIAGNOSIS — D631 Anemia in chronic kidney disease: Secondary | ICD-10-CM | POA: Diagnosis not present

## 2011-08-11 DIAGNOSIS — N039 Chronic nephritic syndrome with unspecified morphologic changes: Secondary | ICD-10-CM | POA: Diagnosis not present

## 2011-08-11 DIAGNOSIS — N2581 Secondary hyperparathyroidism of renal origin: Secondary | ICD-10-CM | POA: Diagnosis not present

## 2011-08-11 DIAGNOSIS — D509 Iron deficiency anemia, unspecified: Secondary | ICD-10-CM | POA: Diagnosis not present

## 2011-08-11 DIAGNOSIS — N186 End stage renal disease: Secondary | ICD-10-CM | POA: Diagnosis not present

## 2011-08-11 DIAGNOSIS — I959 Hypotension, unspecified: Secondary | ICD-10-CM | POA: Diagnosis not present

## 2011-08-13 DIAGNOSIS — S92309A Fracture of unspecified metatarsal bone(s), unspecified foot, initial encounter for closed fracture: Secondary | ICD-10-CM | POA: Diagnosis not present

## 2011-08-14 DIAGNOSIS — D509 Iron deficiency anemia, unspecified: Secondary | ICD-10-CM | POA: Diagnosis not present

## 2011-08-14 DIAGNOSIS — D631 Anemia in chronic kidney disease: Secondary | ICD-10-CM | POA: Diagnosis not present

## 2011-08-14 DIAGNOSIS — N2581 Secondary hyperparathyroidism of renal origin: Secondary | ICD-10-CM | POA: Diagnosis not present

## 2011-08-14 DIAGNOSIS — N186 End stage renal disease: Secondary | ICD-10-CM | POA: Diagnosis not present

## 2011-08-14 DIAGNOSIS — I959 Hypotension, unspecified: Secondary | ICD-10-CM | POA: Diagnosis not present

## 2011-08-16 DIAGNOSIS — N2581 Secondary hyperparathyroidism of renal origin: Secondary | ICD-10-CM | POA: Diagnosis not present

## 2011-08-16 DIAGNOSIS — D631 Anemia in chronic kidney disease: Secondary | ICD-10-CM | POA: Diagnosis not present

## 2011-08-16 DIAGNOSIS — N039 Chronic nephritic syndrome with unspecified morphologic changes: Secondary | ICD-10-CM | POA: Diagnosis not present

## 2011-08-16 DIAGNOSIS — I959 Hypotension, unspecified: Secondary | ICD-10-CM | POA: Diagnosis not present

## 2011-08-16 DIAGNOSIS — N186 End stage renal disease: Secondary | ICD-10-CM | POA: Diagnosis not present

## 2011-08-16 DIAGNOSIS — D509 Iron deficiency anemia, unspecified: Secondary | ICD-10-CM | POA: Diagnosis not present

## 2011-08-18 DIAGNOSIS — D631 Anemia in chronic kidney disease: Secondary | ICD-10-CM | POA: Diagnosis not present

## 2011-08-18 DIAGNOSIS — I959 Hypotension, unspecified: Secondary | ICD-10-CM | POA: Diagnosis not present

## 2011-08-18 DIAGNOSIS — N186 End stage renal disease: Secondary | ICD-10-CM | POA: Diagnosis not present

## 2011-08-18 DIAGNOSIS — N2581 Secondary hyperparathyroidism of renal origin: Secondary | ICD-10-CM | POA: Diagnosis not present

## 2011-08-18 DIAGNOSIS — D509 Iron deficiency anemia, unspecified: Secondary | ICD-10-CM | POA: Diagnosis not present

## 2011-08-20 ENCOUNTER — Ambulatory Visit: Payer: Self-pay | Admitting: Vascular Surgery

## 2011-08-20 DIAGNOSIS — N186 End stage renal disease: Secondary | ICD-10-CM | POA: Diagnosis not present

## 2011-08-20 DIAGNOSIS — T82898A Other specified complication of vascular prosthetic devices, implants and grafts, initial encounter: Secondary | ICD-10-CM | POA: Diagnosis not present

## 2011-08-20 DIAGNOSIS — Z01810 Encounter for preprocedural cardiovascular examination: Secondary | ICD-10-CM | POA: Diagnosis not present

## 2011-08-20 DIAGNOSIS — Z79899 Other long term (current) drug therapy: Secondary | ICD-10-CM | POA: Diagnosis not present

## 2011-08-20 DIAGNOSIS — Z992 Dependence on renal dialysis: Secondary | ICD-10-CM | POA: Diagnosis not present

## 2011-08-21 DIAGNOSIS — I959 Hypotension, unspecified: Secondary | ICD-10-CM | POA: Diagnosis not present

## 2011-08-21 DIAGNOSIS — D631 Anemia in chronic kidney disease: Secondary | ICD-10-CM | POA: Diagnosis not present

## 2011-08-21 DIAGNOSIS — N186 End stage renal disease: Secondary | ICD-10-CM | POA: Diagnosis not present

## 2011-08-21 DIAGNOSIS — D509 Iron deficiency anemia, unspecified: Secondary | ICD-10-CM | POA: Diagnosis not present

## 2011-08-21 DIAGNOSIS — N2581 Secondary hyperparathyroidism of renal origin: Secondary | ICD-10-CM | POA: Diagnosis not present

## 2011-08-23 DIAGNOSIS — I959 Hypotension, unspecified: Secondary | ICD-10-CM | POA: Diagnosis not present

## 2011-08-23 DIAGNOSIS — N2581 Secondary hyperparathyroidism of renal origin: Secondary | ICD-10-CM | POA: Diagnosis not present

## 2011-08-23 DIAGNOSIS — N186 End stage renal disease: Secondary | ICD-10-CM | POA: Diagnosis not present

## 2011-08-23 DIAGNOSIS — D509 Iron deficiency anemia, unspecified: Secondary | ICD-10-CM | POA: Diagnosis not present

## 2011-08-23 DIAGNOSIS — D631 Anemia in chronic kidney disease: Secondary | ICD-10-CM | POA: Diagnosis not present

## 2011-08-25 DIAGNOSIS — I959 Hypotension, unspecified: Secondary | ICD-10-CM | POA: Diagnosis not present

## 2011-08-25 DIAGNOSIS — N2581 Secondary hyperparathyroidism of renal origin: Secondary | ICD-10-CM | POA: Diagnosis not present

## 2011-08-25 DIAGNOSIS — D509 Iron deficiency anemia, unspecified: Secondary | ICD-10-CM | POA: Diagnosis not present

## 2011-08-25 DIAGNOSIS — N186 End stage renal disease: Secondary | ICD-10-CM | POA: Diagnosis not present

## 2011-08-25 DIAGNOSIS — D631 Anemia in chronic kidney disease: Secondary | ICD-10-CM | POA: Diagnosis not present

## 2011-08-26 DIAGNOSIS — N186 End stage renal disease: Secondary | ICD-10-CM | POA: Diagnosis not present

## 2011-08-28 DIAGNOSIS — N186 End stage renal disease: Secondary | ICD-10-CM | POA: Diagnosis not present

## 2011-08-28 DIAGNOSIS — D509 Iron deficiency anemia, unspecified: Secondary | ICD-10-CM | POA: Diagnosis not present

## 2011-08-28 DIAGNOSIS — D631 Anemia in chronic kidney disease: Secondary | ICD-10-CM | POA: Diagnosis not present

## 2011-08-28 DIAGNOSIS — I959 Hypotension, unspecified: Secondary | ICD-10-CM | POA: Diagnosis not present

## 2011-08-28 DIAGNOSIS — N2581 Secondary hyperparathyroidism of renal origin: Secondary | ICD-10-CM | POA: Diagnosis not present

## 2011-09-17 DIAGNOSIS — S92309A Fracture of unspecified metatarsal bone(s), unspecified foot, initial encounter for closed fracture: Secondary | ICD-10-CM | POA: Diagnosis not present

## 2011-09-26 DIAGNOSIS — N186 End stage renal disease: Secondary | ICD-10-CM | POA: Diagnosis not present

## 2011-09-27 DIAGNOSIS — D509 Iron deficiency anemia, unspecified: Secondary | ICD-10-CM | POA: Diagnosis not present

## 2011-09-27 DIAGNOSIS — I959 Hypotension, unspecified: Secondary | ICD-10-CM | POA: Diagnosis not present

## 2011-09-27 DIAGNOSIS — N2581 Secondary hyperparathyroidism of renal origin: Secondary | ICD-10-CM | POA: Diagnosis not present

## 2011-09-27 DIAGNOSIS — N186 End stage renal disease: Secondary | ICD-10-CM | POA: Diagnosis not present

## 2011-10-27 DIAGNOSIS — N186 End stage renal disease: Secondary | ICD-10-CM | POA: Diagnosis not present

## 2011-10-30 DIAGNOSIS — N186 End stage renal disease: Secondary | ICD-10-CM | POA: Diagnosis not present

## 2011-10-30 DIAGNOSIS — D631 Anemia in chronic kidney disease: Secondary | ICD-10-CM | POA: Diagnosis not present

## 2011-10-30 DIAGNOSIS — D509 Iron deficiency anemia, unspecified: Secondary | ICD-10-CM | POA: Diagnosis not present

## 2011-10-30 DIAGNOSIS — I959 Hypotension, unspecified: Secondary | ICD-10-CM | POA: Diagnosis not present

## 2011-10-30 DIAGNOSIS — N2581 Secondary hyperparathyroidism of renal origin: Secondary | ICD-10-CM | POA: Diagnosis not present

## 2011-11-08 DIAGNOSIS — I953 Hypotension of hemodialysis: Secondary | ICD-10-CM | POA: Diagnosis not present

## 2011-11-09 DIAGNOSIS — T82898A Other specified complication of vascular prosthetic devices, implants and grafts, initial encounter: Secondary | ICD-10-CM | POA: Diagnosis not present

## 2011-11-09 DIAGNOSIS — N186 End stage renal disease: Secondary | ICD-10-CM | POA: Diagnosis not present

## 2011-11-26 DIAGNOSIS — N186 End stage renal disease: Secondary | ICD-10-CM | POA: Diagnosis not present

## 2011-11-27 DIAGNOSIS — N2581 Secondary hyperparathyroidism of renal origin: Secondary | ICD-10-CM | POA: Diagnosis not present

## 2011-11-27 DIAGNOSIS — N186 End stage renal disease: Secondary | ICD-10-CM | POA: Diagnosis not present

## 2011-11-27 DIAGNOSIS — D631 Anemia in chronic kidney disease: Secondary | ICD-10-CM | POA: Diagnosis not present

## 2011-11-27 DIAGNOSIS — Z23 Encounter for immunization: Secondary | ICD-10-CM | POA: Diagnosis not present

## 2011-11-27 DIAGNOSIS — I959 Hypotension, unspecified: Secondary | ICD-10-CM | POA: Diagnosis not present

## 2011-11-27 DIAGNOSIS — D509 Iron deficiency anemia, unspecified: Secondary | ICD-10-CM | POA: Diagnosis not present

## 2011-11-27 DIAGNOSIS — N039 Chronic nephritic syndrome with unspecified morphologic changes: Secondary | ICD-10-CM | POA: Diagnosis not present

## 2011-11-29 DIAGNOSIS — N186 End stage renal disease: Secondary | ICD-10-CM | POA: Diagnosis not present

## 2011-11-29 DIAGNOSIS — D631 Anemia in chronic kidney disease: Secondary | ICD-10-CM | POA: Diagnosis not present

## 2011-11-29 DIAGNOSIS — N2581 Secondary hyperparathyroidism of renal origin: Secondary | ICD-10-CM | POA: Diagnosis not present

## 2011-11-29 DIAGNOSIS — Z23 Encounter for immunization: Secondary | ICD-10-CM | POA: Diagnosis not present

## 2011-11-29 DIAGNOSIS — I959 Hypotension, unspecified: Secondary | ICD-10-CM | POA: Diagnosis not present

## 2011-11-29 DIAGNOSIS — D509 Iron deficiency anemia, unspecified: Secondary | ICD-10-CM | POA: Diagnosis not present

## 2011-11-29 DIAGNOSIS — N039 Chronic nephritic syndrome with unspecified morphologic changes: Secondary | ICD-10-CM | POA: Diagnosis not present

## 2011-12-01 DIAGNOSIS — I959 Hypotension, unspecified: Secondary | ICD-10-CM | POA: Diagnosis not present

## 2011-12-01 DIAGNOSIS — D631 Anemia in chronic kidney disease: Secondary | ICD-10-CM | POA: Diagnosis not present

## 2011-12-01 DIAGNOSIS — N186 End stage renal disease: Secondary | ICD-10-CM | POA: Diagnosis not present

## 2011-12-01 DIAGNOSIS — Z23 Encounter for immunization: Secondary | ICD-10-CM | POA: Diagnosis not present

## 2011-12-01 DIAGNOSIS — N2581 Secondary hyperparathyroidism of renal origin: Secondary | ICD-10-CM | POA: Diagnosis not present

## 2011-12-01 DIAGNOSIS — D509 Iron deficiency anemia, unspecified: Secondary | ICD-10-CM | POA: Diagnosis not present

## 2011-12-04 DIAGNOSIS — Z23 Encounter for immunization: Secondary | ICD-10-CM | POA: Diagnosis not present

## 2011-12-04 DIAGNOSIS — N186 End stage renal disease: Secondary | ICD-10-CM | POA: Diagnosis not present

## 2011-12-04 DIAGNOSIS — D509 Iron deficiency anemia, unspecified: Secondary | ICD-10-CM | POA: Diagnosis not present

## 2011-12-04 DIAGNOSIS — I959 Hypotension, unspecified: Secondary | ICD-10-CM | POA: Diagnosis not present

## 2011-12-04 DIAGNOSIS — N039 Chronic nephritic syndrome with unspecified morphologic changes: Secondary | ICD-10-CM | POA: Diagnosis not present

## 2011-12-04 DIAGNOSIS — N2581 Secondary hyperparathyroidism of renal origin: Secondary | ICD-10-CM | POA: Diagnosis not present

## 2011-12-04 DIAGNOSIS — D631 Anemia in chronic kidney disease: Secondary | ICD-10-CM | POA: Diagnosis not present

## 2011-12-06 DIAGNOSIS — D631 Anemia in chronic kidney disease: Secondary | ICD-10-CM | POA: Diagnosis not present

## 2011-12-06 DIAGNOSIS — N186 End stage renal disease: Secondary | ICD-10-CM | POA: Diagnosis not present

## 2011-12-06 DIAGNOSIS — N2581 Secondary hyperparathyroidism of renal origin: Secondary | ICD-10-CM | POA: Diagnosis not present

## 2011-12-06 DIAGNOSIS — D509 Iron deficiency anemia, unspecified: Secondary | ICD-10-CM | POA: Diagnosis not present

## 2011-12-06 DIAGNOSIS — I959 Hypotension, unspecified: Secondary | ICD-10-CM | POA: Diagnosis not present

## 2011-12-06 DIAGNOSIS — Z23 Encounter for immunization: Secondary | ICD-10-CM | POA: Diagnosis not present

## 2011-12-08 DIAGNOSIS — D509 Iron deficiency anemia, unspecified: Secondary | ICD-10-CM | POA: Diagnosis not present

## 2011-12-08 DIAGNOSIS — Z23 Encounter for immunization: Secondary | ICD-10-CM | POA: Diagnosis not present

## 2011-12-08 DIAGNOSIS — I959 Hypotension, unspecified: Secondary | ICD-10-CM | POA: Diagnosis not present

## 2011-12-08 DIAGNOSIS — N2581 Secondary hyperparathyroidism of renal origin: Secondary | ICD-10-CM | POA: Diagnosis not present

## 2011-12-08 DIAGNOSIS — D631 Anemia in chronic kidney disease: Secondary | ICD-10-CM | POA: Diagnosis not present

## 2011-12-08 DIAGNOSIS — N186 End stage renal disease: Secondary | ICD-10-CM | POA: Diagnosis not present

## 2011-12-08 DIAGNOSIS — N039 Chronic nephritic syndrome with unspecified morphologic changes: Secondary | ICD-10-CM | POA: Diagnosis not present

## 2011-12-11 DIAGNOSIS — D509 Iron deficiency anemia, unspecified: Secondary | ICD-10-CM | POA: Diagnosis not present

## 2011-12-11 DIAGNOSIS — Z23 Encounter for immunization: Secondary | ICD-10-CM | POA: Diagnosis not present

## 2011-12-11 DIAGNOSIS — N2581 Secondary hyperparathyroidism of renal origin: Secondary | ICD-10-CM | POA: Diagnosis not present

## 2011-12-11 DIAGNOSIS — D631 Anemia in chronic kidney disease: Secondary | ICD-10-CM | POA: Diagnosis not present

## 2011-12-11 DIAGNOSIS — N186 End stage renal disease: Secondary | ICD-10-CM | POA: Diagnosis not present

## 2011-12-11 DIAGNOSIS — N039 Chronic nephritic syndrome with unspecified morphologic changes: Secondary | ICD-10-CM | POA: Diagnosis not present

## 2011-12-11 DIAGNOSIS — I959 Hypotension, unspecified: Secondary | ICD-10-CM | POA: Diagnosis not present

## 2011-12-13 DIAGNOSIS — N186 End stage renal disease: Secondary | ICD-10-CM | POA: Diagnosis not present

## 2011-12-13 DIAGNOSIS — N2581 Secondary hyperparathyroidism of renal origin: Secondary | ICD-10-CM | POA: Diagnosis not present

## 2011-12-13 DIAGNOSIS — I959 Hypotension, unspecified: Secondary | ICD-10-CM | POA: Diagnosis not present

## 2011-12-13 DIAGNOSIS — D509 Iron deficiency anemia, unspecified: Secondary | ICD-10-CM | POA: Diagnosis not present

## 2011-12-13 DIAGNOSIS — Z23 Encounter for immunization: Secondary | ICD-10-CM | POA: Diagnosis not present

## 2011-12-13 DIAGNOSIS — D631 Anemia in chronic kidney disease: Secondary | ICD-10-CM | POA: Diagnosis not present

## 2011-12-15 DIAGNOSIS — I959 Hypotension, unspecified: Secondary | ICD-10-CM | POA: Diagnosis not present

## 2011-12-15 DIAGNOSIS — N186 End stage renal disease: Secondary | ICD-10-CM | POA: Diagnosis not present

## 2011-12-15 DIAGNOSIS — N039 Chronic nephritic syndrome with unspecified morphologic changes: Secondary | ICD-10-CM | POA: Diagnosis not present

## 2011-12-15 DIAGNOSIS — N2581 Secondary hyperparathyroidism of renal origin: Secondary | ICD-10-CM | POA: Diagnosis not present

## 2011-12-15 DIAGNOSIS — D631 Anemia in chronic kidney disease: Secondary | ICD-10-CM | POA: Diagnosis not present

## 2011-12-15 DIAGNOSIS — Z23 Encounter for immunization: Secondary | ICD-10-CM | POA: Diagnosis not present

## 2011-12-15 DIAGNOSIS — D509 Iron deficiency anemia, unspecified: Secondary | ICD-10-CM | POA: Diagnosis not present

## 2011-12-17 DIAGNOSIS — I959 Hypotension, unspecified: Secondary | ICD-10-CM | POA: Diagnosis not present

## 2011-12-17 DIAGNOSIS — N2581 Secondary hyperparathyroidism of renal origin: Secondary | ICD-10-CM | POA: Diagnosis not present

## 2011-12-17 DIAGNOSIS — D509 Iron deficiency anemia, unspecified: Secondary | ICD-10-CM | POA: Diagnosis not present

## 2011-12-17 DIAGNOSIS — D631 Anemia in chronic kidney disease: Secondary | ICD-10-CM | POA: Diagnosis not present

## 2011-12-17 DIAGNOSIS — N186 End stage renal disease: Secondary | ICD-10-CM | POA: Diagnosis not present

## 2011-12-17 DIAGNOSIS — Z23 Encounter for immunization: Secondary | ICD-10-CM | POA: Diagnosis not present

## 2011-12-19 DIAGNOSIS — I959 Hypotension, unspecified: Secondary | ICD-10-CM | POA: Diagnosis not present

## 2011-12-19 DIAGNOSIS — N186 End stage renal disease: Secondary | ICD-10-CM | POA: Diagnosis not present

## 2011-12-19 DIAGNOSIS — N2581 Secondary hyperparathyroidism of renal origin: Secondary | ICD-10-CM | POA: Diagnosis not present

## 2011-12-19 DIAGNOSIS — Z23 Encounter for immunization: Secondary | ICD-10-CM | POA: Diagnosis not present

## 2011-12-19 DIAGNOSIS — D631 Anemia in chronic kidney disease: Secondary | ICD-10-CM | POA: Diagnosis not present

## 2011-12-19 DIAGNOSIS — D509 Iron deficiency anemia, unspecified: Secondary | ICD-10-CM | POA: Diagnosis not present

## 2011-12-21 DIAGNOSIS — I959 Hypotension, unspecified: Secondary | ICD-10-CM | POA: Diagnosis not present

## 2011-12-21 DIAGNOSIS — N2581 Secondary hyperparathyroidism of renal origin: Secondary | ICD-10-CM | POA: Diagnosis not present

## 2011-12-21 DIAGNOSIS — D631 Anemia in chronic kidney disease: Secondary | ICD-10-CM | POA: Diagnosis not present

## 2011-12-21 DIAGNOSIS — D509 Iron deficiency anemia, unspecified: Secondary | ICD-10-CM | POA: Diagnosis not present

## 2011-12-21 DIAGNOSIS — Z23 Encounter for immunization: Secondary | ICD-10-CM | POA: Diagnosis not present

## 2011-12-21 DIAGNOSIS — N186 End stage renal disease: Secondary | ICD-10-CM | POA: Diagnosis not present

## 2011-12-21 DIAGNOSIS — N039 Chronic nephritic syndrome with unspecified morphologic changes: Secondary | ICD-10-CM | POA: Diagnosis not present

## 2011-12-24 DIAGNOSIS — N2581 Secondary hyperparathyroidism of renal origin: Secondary | ICD-10-CM | POA: Diagnosis not present

## 2011-12-24 DIAGNOSIS — D631 Anemia in chronic kidney disease: Secondary | ICD-10-CM | POA: Diagnosis not present

## 2011-12-24 DIAGNOSIS — Z23 Encounter for immunization: Secondary | ICD-10-CM | POA: Diagnosis not present

## 2011-12-24 DIAGNOSIS — D509 Iron deficiency anemia, unspecified: Secondary | ICD-10-CM | POA: Diagnosis not present

## 2011-12-24 DIAGNOSIS — N186 End stage renal disease: Secondary | ICD-10-CM | POA: Diagnosis not present

## 2011-12-24 DIAGNOSIS — N039 Chronic nephritic syndrome with unspecified morphologic changes: Secondary | ICD-10-CM | POA: Diagnosis not present

## 2011-12-24 DIAGNOSIS — I959 Hypotension, unspecified: Secondary | ICD-10-CM | POA: Diagnosis not present

## 2011-12-26 DIAGNOSIS — D509 Iron deficiency anemia, unspecified: Secondary | ICD-10-CM | POA: Diagnosis not present

## 2011-12-26 DIAGNOSIS — N039 Chronic nephritic syndrome with unspecified morphologic changes: Secondary | ICD-10-CM | POA: Diagnosis not present

## 2011-12-26 DIAGNOSIS — D631 Anemia in chronic kidney disease: Secondary | ICD-10-CM | POA: Diagnosis not present

## 2011-12-26 DIAGNOSIS — N2581 Secondary hyperparathyroidism of renal origin: Secondary | ICD-10-CM | POA: Diagnosis not present

## 2011-12-26 DIAGNOSIS — N186 End stage renal disease: Secondary | ICD-10-CM | POA: Diagnosis not present

## 2011-12-26 DIAGNOSIS — Z23 Encounter for immunization: Secondary | ICD-10-CM | POA: Diagnosis not present

## 2011-12-26 DIAGNOSIS — I959 Hypotension, unspecified: Secondary | ICD-10-CM | POA: Diagnosis not present

## 2011-12-27 DIAGNOSIS — N186 End stage renal disease: Secondary | ICD-10-CM | POA: Diagnosis not present

## 2011-12-28 DIAGNOSIS — N2581 Secondary hyperparathyroidism of renal origin: Secondary | ICD-10-CM | POA: Diagnosis not present

## 2011-12-28 DIAGNOSIS — D509 Iron deficiency anemia, unspecified: Secondary | ICD-10-CM | POA: Diagnosis not present

## 2011-12-28 DIAGNOSIS — D631 Anemia in chronic kidney disease: Secondary | ICD-10-CM | POA: Diagnosis not present

## 2011-12-28 DIAGNOSIS — I959 Hypotension, unspecified: Secondary | ICD-10-CM | POA: Diagnosis not present

## 2011-12-28 DIAGNOSIS — N186 End stage renal disease: Secondary | ICD-10-CM | POA: Diagnosis not present

## 2012-01-22 DIAGNOSIS — L219 Seborrheic dermatitis, unspecified: Secondary | ICD-10-CM | POA: Diagnosis not present

## 2012-01-26 DIAGNOSIS — N186 End stage renal disease: Secondary | ICD-10-CM | POA: Diagnosis not present

## 2012-01-28 DIAGNOSIS — I959 Hypotension, unspecified: Secondary | ICD-10-CM | POA: Diagnosis not present

## 2012-01-28 DIAGNOSIS — D509 Iron deficiency anemia, unspecified: Secondary | ICD-10-CM | POA: Diagnosis not present

## 2012-01-28 DIAGNOSIS — N2581 Secondary hyperparathyroidism of renal origin: Secondary | ICD-10-CM | POA: Diagnosis not present

## 2012-01-28 DIAGNOSIS — D631 Anemia in chronic kidney disease: Secondary | ICD-10-CM | POA: Diagnosis not present

## 2012-01-28 DIAGNOSIS — N186 End stage renal disease: Secondary | ICD-10-CM | POA: Diagnosis not present

## 2012-01-31 ENCOUNTER — Ambulatory Visit: Payer: Self-pay | Admitting: Vascular Surgery

## 2012-01-31 DIAGNOSIS — Z992 Dependence on renal dialysis: Secondary | ICD-10-CM | POA: Diagnosis not present

## 2012-01-31 DIAGNOSIS — T82898A Other specified complication of vascular prosthetic devices, implants and grafts, initial encounter: Secondary | ICD-10-CM | POA: Diagnosis not present

## 2012-01-31 DIAGNOSIS — I10 Essential (primary) hypertension: Secondary | ICD-10-CM | POA: Diagnosis not present

## 2012-01-31 DIAGNOSIS — Z79899 Other long term (current) drug therapy: Secondary | ICD-10-CM | POA: Diagnosis not present

## 2012-01-31 DIAGNOSIS — Z9889 Other specified postprocedural states: Secondary | ICD-10-CM | POA: Diagnosis not present

## 2012-01-31 DIAGNOSIS — N186 End stage renal disease: Secondary | ICD-10-CM | POA: Diagnosis not present

## 2012-02-26 DIAGNOSIS — N186 End stage renal disease: Secondary | ICD-10-CM | POA: Diagnosis not present

## 2012-02-29 DIAGNOSIS — Z992 Dependence on renal dialysis: Secondary | ICD-10-CM | POA: Diagnosis not present

## 2012-02-29 DIAGNOSIS — D509 Iron deficiency anemia, unspecified: Secondary | ICD-10-CM | POA: Diagnosis not present

## 2012-02-29 DIAGNOSIS — N186 End stage renal disease: Secondary | ICD-10-CM | POA: Diagnosis not present

## 2012-02-29 DIAGNOSIS — I959 Hypotension, unspecified: Secondary | ICD-10-CM | POA: Diagnosis not present

## 2012-02-29 DIAGNOSIS — N2581 Secondary hyperparathyroidism of renal origin: Secondary | ICD-10-CM | POA: Diagnosis not present

## 2012-03-13 DIAGNOSIS — T82898A Other specified complication of vascular prosthetic devices, implants and grafts, initial encounter: Secondary | ICD-10-CM | POA: Diagnosis not present

## 2012-03-13 DIAGNOSIS — N186 End stage renal disease: Secondary | ICD-10-CM | POA: Diagnosis not present

## 2012-03-28 DIAGNOSIS — N186 End stage renal disease: Secondary | ICD-10-CM | POA: Diagnosis not present

## 2012-03-31 DIAGNOSIS — N186 End stage renal disease: Secondary | ICD-10-CM | POA: Diagnosis not present

## 2012-03-31 DIAGNOSIS — D509 Iron deficiency anemia, unspecified: Secondary | ICD-10-CM | POA: Diagnosis not present

## 2012-03-31 DIAGNOSIS — N2581 Secondary hyperparathyroidism of renal origin: Secondary | ICD-10-CM | POA: Diagnosis not present

## 2012-03-31 DIAGNOSIS — I959 Hypotension, unspecified: Secondary | ICD-10-CM | POA: Diagnosis not present

## 2012-03-31 DIAGNOSIS — D631 Anemia in chronic kidney disease: Secondary | ICD-10-CM | POA: Diagnosis not present

## 2012-04-07 ENCOUNTER — Other Ambulatory Visit: Payer: Self-pay | Admitting: Nephrology

## 2012-04-07 DIAGNOSIS — Z1239 Encounter for other screening for malignant neoplasm of breast: Secondary | ICD-10-CM

## 2012-04-18 ENCOUNTER — Ambulatory Visit: Payer: Self-pay | Admitting: Vascular Surgery

## 2012-04-18 DIAGNOSIS — N186 End stage renal disease: Secondary | ICD-10-CM | POA: Diagnosis not present

## 2012-04-18 DIAGNOSIS — I10 Essential (primary) hypertension: Secondary | ICD-10-CM | POA: Diagnosis not present

## 2012-04-18 DIAGNOSIS — Z992 Dependence on renal dialysis: Secondary | ICD-10-CM | POA: Diagnosis not present

## 2012-04-18 DIAGNOSIS — I120 Hypertensive chronic kidney disease with stage 5 chronic kidney disease or end stage renal disease: Secondary | ICD-10-CM | POA: Diagnosis not present

## 2012-04-18 DIAGNOSIS — T82898A Other specified complication of vascular prosthetic devices, implants and grafts, initial encounter: Secondary | ICD-10-CM | POA: Diagnosis not present

## 2012-04-25 DIAGNOSIS — N186 End stage renal disease: Secondary | ICD-10-CM | POA: Diagnosis not present

## 2012-04-28 DIAGNOSIS — N2581 Secondary hyperparathyroidism of renal origin: Secondary | ICD-10-CM | POA: Diagnosis not present

## 2012-04-28 DIAGNOSIS — N186 End stage renal disease: Secondary | ICD-10-CM | POA: Diagnosis not present

## 2012-04-28 DIAGNOSIS — D509 Iron deficiency anemia, unspecified: Secondary | ICD-10-CM | POA: Diagnosis not present

## 2012-04-28 DIAGNOSIS — D631 Anemia in chronic kidney disease: Secondary | ICD-10-CM | POA: Diagnosis not present

## 2012-05-13 ENCOUNTER — Ambulatory Visit
Admission: RE | Admit: 2012-05-13 | Discharge: 2012-05-13 | Disposition: A | Discharge status: Home / Self Care | Payer: Medicare Other | Source: Ambulatory Visit | Attending: Nephrology | Admitting: Nephrology

## 2012-05-13 DIAGNOSIS — Z1239 Encounter for other screening for malignant neoplasm of breast: Secondary | ICD-10-CM

## 2012-05-13 DIAGNOSIS — Z1231 Encounter for screening mammogram for malignant neoplasm of breast: Secondary | ICD-10-CM | POA: Diagnosis not present

## 2012-05-15 DIAGNOSIS — I10 Essential (primary) hypertension: Secondary | ICD-10-CM | POA: Diagnosis not present

## 2012-05-15 DIAGNOSIS — N186 End stage renal disease: Secondary | ICD-10-CM | POA: Diagnosis not present

## 2012-05-15 DIAGNOSIS — T82898A Other specified complication of vascular prosthetic devices, implants and grafts, initial encounter: Secondary | ICD-10-CM | POA: Diagnosis not present

## 2012-05-15 DIAGNOSIS — E669 Obesity, unspecified: Secondary | ICD-10-CM | POA: Diagnosis not present

## 2012-05-19 ENCOUNTER — Ambulatory Visit: Payer: Self-pay | Admitting: Vascular Surgery

## 2012-05-19 DIAGNOSIS — N186 End stage renal disease: Secondary | ICD-10-CM | POA: Diagnosis not present

## 2012-05-19 DIAGNOSIS — Z79899 Other long term (current) drug therapy: Secondary | ICD-10-CM | POA: Diagnosis not present

## 2012-05-19 DIAGNOSIS — I120 Hypertensive chronic kidney disease with stage 5 chronic kidney disease or end stage renal disease: Secondary | ICD-10-CM | POA: Diagnosis not present

## 2012-05-19 DIAGNOSIS — T82898A Other specified complication of vascular prosthetic devices, implants and grafts, initial encounter: Secondary | ICD-10-CM | POA: Diagnosis not present

## 2012-05-19 DIAGNOSIS — Z992 Dependence on renal dialysis: Secondary | ICD-10-CM | POA: Diagnosis not present

## 2012-05-19 LAB — POTASSIUM: Potassium: 4.9 mmol/L (ref 3.5–5.1)

## 2012-05-26 DIAGNOSIS — N186 End stage renal disease: Secondary | ICD-10-CM | POA: Diagnosis not present

## 2012-05-28 DIAGNOSIS — D631 Anemia in chronic kidney disease: Secondary | ICD-10-CM | POA: Diagnosis not present

## 2012-05-28 DIAGNOSIS — D509 Iron deficiency anemia, unspecified: Secondary | ICD-10-CM | POA: Diagnosis not present

## 2012-05-28 DIAGNOSIS — N2581 Secondary hyperparathyroidism of renal origin: Secondary | ICD-10-CM | POA: Diagnosis not present

## 2012-05-28 DIAGNOSIS — N186 End stage renal disease: Secondary | ICD-10-CM | POA: Diagnosis not present

## 2012-06-12 DIAGNOSIS — T82898A Other specified complication of vascular prosthetic devices, implants and grafts, initial encounter: Secondary | ICD-10-CM | POA: Diagnosis not present

## 2012-06-12 DIAGNOSIS — I10 Essential (primary) hypertension: Secondary | ICD-10-CM | POA: Diagnosis not present

## 2012-06-12 DIAGNOSIS — N186 End stage renal disease: Secondary | ICD-10-CM | POA: Diagnosis not present

## 2012-06-25 DIAGNOSIS — N186 End stage renal disease: Secondary | ICD-10-CM | POA: Diagnosis not present

## 2012-06-27 DIAGNOSIS — D509 Iron deficiency anemia, unspecified: Secondary | ICD-10-CM | POA: Diagnosis not present

## 2012-06-27 DIAGNOSIS — N2581 Secondary hyperparathyroidism of renal origin: Secondary | ICD-10-CM | POA: Diagnosis not present

## 2012-06-27 DIAGNOSIS — D631 Anemia in chronic kidney disease: Secondary | ICD-10-CM | POA: Diagnosis not present

## 2012-06-27 DIAGNOSIS — N186 End stage renal disease: Secondary | ICD-10-CM | POA: Diagnosis not present

## 2012-07-17 ENCOUNTER — Ambulatory Visit (INDEPENDENT_AMBULATORY_CARE_PROVIDER_SITE_OTHER): Payer: Medicare Other | Admitting: Gynecology

## 2012-07-17 ENCOUNTER — Encounter: Payer: Self-pay | Admitting: Gynecology

## 2012-07-17 VITALS — BP 118/76 | Ht 60.5 in | Wt 240.0 lb

## 2012-07-17 DIAGNOSIS — D259 Leiomyoma of uterus, unspecified: Secondary | ICD-10-CM

## 2012-07-17 DIAGNOSIS — Z01419 Encounter for gynecological examination (general) (routine) without abnormal findings: Secondary | ICD-10-CM | POA: Diagnosis not present

## 2012-07-17 DIAGNOSIS — N760 Acute vaginitis: Secondary | ICD-10-CM | POA: Diagnosis not present

## 2012-07-17 LAB — WET PREP FOR TRICH, YEAST, CLUE: WBC, Wet Prep HPF POC: NONE SEEN

## 2012-07-17 NOTE — Patient Instructions (Signed)
Uterine Fibroid A uterine fibroid is a growth (tumor) that occurs in a woman's uterus. This type of tumor is not cancerous and does not spread out of the uterus. A woman can have one or many fibroids, and the fiboid(s) can become quite large. A fibroid can vary in size, weight, and where it grows in the uterus. Most fibroids do not require medical treatment, but some can cause pain or heavy bleeding during and between periods. CAUSES  A fibroid is the result of a single uterine cell that keeps growing (unregulated), which is different than most cells in the human body. Most cells have a control mechanism that keeps them from reproducing without control.  SYMPTOMS   Bleeding.  Pelvic pain and pressure.  Bladder problems due to the size of the fibroid.  Infertility and miscarriages depending on the size and location of the fibroid. DIAGNOSIS  A diagnosis is made by physical exam. Your caregiver may feel the lumpy tumors during a pelvic exam. Important information regarding size, location, and number of tumors can be gained by having an ultrasound. It is rare that other tests, such as a CT scan or MRI, are needed. TREATMENT   Your caregiver may recommend watchful waiting. This involves getting the fibroid checked by your caregiver to see if the fibroids grow or shrink.   Hormonal treatment or an intrauterine device (IUD) may be prescribed.   Surgery may be needed to remove the fibroids (myomectomy) or the uterus (hysterectomy). This depends on your situation. When fibroids interfere with fertility and a woman wants to become pregnant, a caregiver may recommend having the fibroids removed.  [facility] care depends on how you were treated. In general:   Keep all follow-up appointments with your caregiver.   Only take medicine as told by your caregiver. Do not take aspirin. It can cause bleeding.   If you have excessive periods and soak tampons or pads in a half hour or  less, contact your caregiver immediately. If your periods are troublesome but not so heavy, lie down with your feet raised slightly above your heart. Place cold packs on your lower abdomen.   If your periods are heavy, write down the number of pads or tampons you use per month. Bring this information to your caregiver.   Talk to your caregiver about taking iron pills.   Include green vegetables in your diet.   If you were prescribed a hormonal treatment, take the hormonal medicines as directed.   If you need surgery, ask your caregiver for information on your specific surgery.  SEEK IMMEDIATE MEDICAL CARE IF:  You have pelvic pain or cramps not controlled with medicines.   You have a sudden increase in pelvic pain.   You have an increase of bleeding between and during periods.   You feel lightheaded or have fainting episodes.  MAKE SURE YOU:  Understand these instructions.  Will watch your condition.  Will get help right away if you are not doing well or get worse. Document Released: 02/10/2000 Document Revised: 05/07/2011 Document Reviewed: 03/05/2011 [facility] Patient Information 2014 [place].

## 2012-07-17 NOTE — Progress Notes (Addendum)
[name] Marks Aug 07, 1955 [id number]   History:    57 y.o.  for annual gyn exam who is a new patient to the practice. Patient was followed by Dr. Ubaldo Glassing in the past. Her last gynecological examination was in 2013 and she stated she had a normal Pap smear. No prior history of abnormal Pap smear. Patient has history of end-stage renal disease and goes to dialysis 3 times a week.patient has history of fibroids and was complaining of some low abdominal discomfort and some vaginal irritation. Patient's mammogram was this year which was reportedly normal. She had a colonoscopy 2 years ago and has history of colon polyps. Patient's father had history of colon cancer. Patient has not had a bone density study yet. Patient states her dTap Vaccine is up-to-date. Patient would know prior history of hormone replacement therapy.  Past medical history,surgical history, family history and social history were all reviewed and documented in the EPIC chart.  Gynecologic History No LMP recorded. Patient is postmenopausal. Contraception: post menopausal status Last Pap: 2013. Results were: normal Last mammogram: 2014. Results were: normal  Obstetric History OB History   Grav Para Term Preterm Abortions TAB SAB Ect Mult Living   2 2        2      # Outc Date GA Lbr Len/2nd Wgt Sex Del Anes PTL Lv   1 PAR            2 PAR                ROS: A ROS was performed and pertinent positives and negatives are included in the history.  GENERAL: No fevers or chills. HEENT: No change in vision, no earache, sore throat or sinus congestion. NECK: No pain or stiffness. CARDIOVASCULAR: No chest pain or pressure. No palpitations. PULMONARY: No shortness of breath, cough or wheeze. GASTROINTESTINAL: No abdominal pain, nausea, vomiting or diarrhea, melena or bright red blood per rectum. GENITOURINARY: No urinary frequency, urgency, hesitancy or dysuria. MUSCULOSKELETAL: No joint or muscle pain, no back pain, no recent trauma.  DERMATOLOGIC: No rash, no itching, no lesions. ENDOCRINE: No polyuria, polydipsia, no heat or cold intolerance. No recent change in weight. HEMATOLOGICAL: No anemia or easy bruising or bleeding. NEUROLOGIC: No headache, seizures, numbness, tingling or weakness. PSYCHIATRIC: No depression, no loss of interest in normal activity or change in sleep pattern.     Exam: chaperone present  BP 118/76  Ht 5' 0.5" (1.537 m)  Wt 240 lb (108.863 kg)  BMI 46.08 kg/m2  Body Marks index is 46.08 kg/(m^2).  General appearance : Well developed well nourished female. No acute distress HEENT: Neck supple, trachea midline, no carotid bruits, no thyroidmegaly Lungs: Clear to auscultation, no rhonchi or wheezes, or rib retractions  Heart: Regular rate and rhythm, no murmurs or gallops Breast:Examined in sitting and supine position were symmetrical in appearance, no palpable masses or tenderness,  no skin retraction, no nipple inversion, no nipple discharge, no skin discoloration, no axillary or supraclavicular lymphadenopathy Abdomen: no palpable masses or tenderness, no rebound or guarding Extremities: no edema or skin discoloration or tenderness  Pelvic:  Bartholin, Urethra, Skene Glands: Within normal limits             Vagina: No gross lesions or discharge  Cervix: No gross lesions or discharge  Uterus  Anteverted 8 week size irregular shaped,   Adnexa  Without masses or tenderness  Anus and perineum  normal   Rectovaginal  normal sphincter tone without palpated  masses or tenderness             Hemoccult cards provided   Wet prep negative  Assessment/Plan:  57 y.o. female for annual exam who will be recommended to apply probiotic vaginal gel twice a week such as refresh or luvena. Patient will schedule an ultrasound in the next few weeks to better assess her uterus and her adnexa (history of fibroids). She is going to try to obtain records from her previous provider to update our records. She was  reminded to send to the office the Hemoccult cards for testing. Later this year she will need a bone density study.   [name] Mass MD, 1:41 PM 07/17/2012

## 2012-07-26 DIAGNOSIS — N186 End stage renal disease: Secondary | ICD-10-CM | POA: Diagnosis not present

## 2012-07-28 DIAGNOSIS — D509 Iron deficiency anemia, unspecified: Secondary | ICD-10-CM | POA: Diagnosis not present

## 2012-07-28 DIAGNOSIS — D631 Anemia in chronic kidney disease: Secondary | ICD-10-CM | POA: Diagnosis not present

## 2012-07-28 DIAGNOSIS — N186 End stage renal disease: Secondary | ICD-10-CM | POA: Diagnosis not present

## 2012-07-28 DIAGNOSIS — N2581 Secondary hyperparathyroidism of renal origin: Secondary | ICD-10-CM | POA: Diagnosis not present

## 2012-08-08 ENCOUNTER — Ambulatory Visit (INDEPENDENT_AMBULATORY_CARE_PROVIDER_SITE_OTHER): Payer: Medicare Other

## 2012-08-08 ENCOUNTER — Ambulatory Visit (INDEPENDENT_AMBULATORY_CARE_PROVIDER_SITE_OTHER): Payer: Medicare Other | Admitting: Gynecology

## 2012-08-08 DIAGNOSIS — N949 Unspecified condition associated with female genital organs and menstrual cycle: Secondary | ICD-10-CM

## 2012-08-08 DIAGNOSIS — R102 Pelvic and perineal pain unspecified side: Secondary | ICD-10-CM

## 2012-08-08 DIAGNOSIS — D259 Leiomyoma of uterus, unspecified: Secondary | ICD-10-CM

## 2012-08-08 DIAGNOSIS — G8929 Other chronic pain: Secondary | ICD-10-CM

## 2012-08-08 DIAGNOSIS — R9389 Abnormal findings on diagnostic imaging of other specified body structures: Secondary | ICD-10-CM | POA: Diagnosis not present

## 2012-08-08 DIAGNOSIS — D219 Benign neoplasm of connective and other soft tissue, unspecified: Secondary | ICD-10-CM

## 2012-08-08 HISTORY — DX: Other chronic pain: G89.29

## 2012-08-08 NOTE — Progress Notes (Signed)
Patient presented to the office for her ultrasound due the fact that she states that for many years she has had right lower, pain has felt bloated and had history of fibroids.patient wPatient was followed by Dr. Ubaldo Glassing in the past. Her last gynecological examination was in 2013 and she stated she had a normal Pap smear. No prior history of abnormal Pap smear. Patient has history of end-stage renal disease and goes to dialysis 3 times a week. as seen as a new patient in the office on June 13 .  Ultrasound today: Uterus measuring 9.0 x 6.8 x 4.5 cm with endometrial stripe of 7.1 mm. Patient had 4 fibroids the largest one measuring 26 x 20 mm. A small calcified area was noted on the anterior vaginal wall. Ovaries otherwise appeared to be normal. No free fluid in the cul-de-sac was noted.  Review of patient's radiology study done 11/14/2009 her CT of the abdomen and pelvis the radiological interpretation was as follows:  1. No change in slightly prominent lobular multi fibroid uterus  when compared to MRI of 11/15/2008. No adnexal lesion.  2. Both kidneys appear atrophic  I discussed the findings with Hassan Rowan today. She was examined in the supine and sitting position and there was no evidence of midline the hernial defect or umbilicus or inguinal hernias. Her abdomen was soft nontender no rebound guarding. She will return back next week for a sonohysterogram for better assessment of the thickened endometrial cavity and to proceed with an endometrial biopsy. We'll reassess this calcified area near the anterior vaginal wall is well. I have explained to her that her symptomatology does not appear to be GYN related and that she may need to followup with her gastroenterologist.

## 2012-08-25 DIAGNOSIS — N186 End stage renal disease: Secondary | ICD-10-CM | POA: Diagnosis not present

## 2012-08-27 ENCOUNTER — Ambulatory Visit: Payer: Medicare Other | Admitting: Gynecology

## 2012-08-27 ENCOUNTER — Other Ambulatory Visit: Payer: Medicare Other

## 2012-08-27 DIAGNOSIS — N186 End stage renal disease: Secondary | ICD-10-CM | POA: Diagnosis not present

## 2012-08-27 DIAGNOSIS — N2581 Secondary hyperparathyroidism of renal origin: Secondary | ICD-10-CM | POA: Diagnosis not present

## 2012-08-27 DIAGNOSIS — D631 Anemia in chronic kidney disease: Secondary | ICD-10-CM | POA: Diagnosis not present

## 2012-08-27 DIAGNOSIS — D509 Iron deficiency anemia, unspecified: Secondary | ICD-10-CM | POA: Diagnosis not present

## 2012-08-28 ENCOUNTER — Ambulatory Visit (INDEPENDENT_AMBULATORY_CARE_PROVIDER_SITE_OTHER): Payer: Medicare Other

## 2012-08-28 ENCOUNTER — Ambulatory Visit (INDEPENDENT_AMBULATORY_CARE_PROVIDER_SITE_OTHER): Payer: Medicare Other | Admitting: Gynecology

## 2012-08-28 DIAGNOSIS — R9389 Abnormal findings on diagnostic imaging of other specified body structures: Secondary | ICD-10-CM

## 2012-08-28 DIAGNOSIS — N840 Polyp of corpus uteri: Secondary | ICD-10-CM

## 2012-08-28 DIAGNOSIS — D259 Leiomyoma of uterus, unspecified: Secondary | ICD-10-CM | POA: Diagnosis not present

## 2012-08-28 DIAGNOSIS — Z780 Asymptomatic menopausal state: Secondary | ICD-10-CM

## 2012-08-28 NOTE — Progress Notes (Signed)
Patient is a 57 year old who was seen as a new patient in the office on JMay 22. She returned to the office on June 13 for her ultrasound due the fact that she had been complaining of lower abdominal discomfort, feeling bloated, and past history of fibroids. The ultrasound demonstrated the following:  Uterus measuring 9.0 x 6.8 x 4.5 cm with endometrial stripe of 7.1 mm. Patient had 4 fibroids the largest one measuring 26 x 20 mm. A small calcified area was noted on the anterior vaginal wall. Ovaries otherwise appeared to be normal. No free fluid in the cul-de-sac was noted.  Review of patient's radiology study done 11/14/2009 her CT of the abdomen and pelvis the radiological interpretation was as follows:  1. No change in slightly prominent lobular multi fibroid uterus  when compared to MRI of 11/15/2008. No adnexal lesion.  2. Both kidneys appear atrophic  She was asked to return to the office today for a sonohysterogram and possible endometrial biopsy due to the endometrial stripe is 7.1 mm on this postmenopausal patient on no hormone replacement therapy.  Sonohysterogram demonstrated after injecting normal saline into the uterine cavity there was an anterior defect measuring 20 x 9 mm and a posterior defect measuring 16 x 10 mm.  Assessment/plan: Incidental finding of endometrial polyp. Small and significant uterine fibroid intramural. Patient will be scheduled for resectoscopic polypectomy in an outpatient setting but we will need medical clearance from her nephrologist and internist. Literature and information was provided.

## 2012-09-17 ENCOUNTER — Other Ambulatory Visit: Payer: Self-pay | Admitting: Anesthesiology

## 2012-09-17 DIAGNOSIS — K921 Melena: Secondary | ICD-10-CM

## 2012-09-17 DIAGNOSIS — Z1211 Encounter for screening for malignant neoplasm of colon: Secondary | ICD-10-CM

## 2012-09-18 DIAGNOSIS — I10 Essential (primary) hypertension: Secondary | ICD-10-CM | POA: Diagnosis not present

## 2012-09-18 DIAGNOSIS — E669 Obesity, unspecified: Secondary | ICD-10-CM | POA: Diagnosis not present

## 2012-09-18 DIAGNOSIS — N186 End stage renal disease: Secondary | ICD-10-CM | POA: Diagnosis not present

## 2012-09-18 DIAGNOSIS — T82898A Other specified complication of vascular prosthetic devices, implants and grafts, initial encounter: Secondary | ICD-10-CM | POA: Diagnosis not present

## 2012-09-24 ENCOUNTER — Telehealth: Payer: Self-pay

## 2012-09-24 NOTE — Telephone Encounter (Signed)
She can keep her surgery it should make no difference

## 2012-09-24 NOTE — Telephone Encounter (Signed)
Patient is scheduled for [facility] Hysteroscopy on Aug 7th.  She called today because she was wondering in light of her recent Positive HCS test should she hold off on her surgery until she has this checked out with her GI MD.  I told her I felt sure Dr. Toney Rakes would want her to go ahead with her surgery.  I told her that they were two separate issues and both important to follow up on and no reason to postpone D&C, Hyst.  She said her last EKG was 06/2011 and asked if she needed to have another one.  I told her that anesthesia would assess and order whatever they needed and would get an EKG at her pre op appt if she needed one. Otherwise, the MD who ordered the last EKG could make recommendation when she needed her next one based on the results of the last one.

## 2012-09-25 ENCOUNTER — Ambulatory Visit (INDEPENDENT_AMBULATORY_CARE_PROVIDER_SITE_OTHER): Payer: Medicare Other | Admitting: Gynecology

## 2012-09-25 ENCOUNTER — Encounter: Payer: Self-pay | Admitting: Gynecology

## 2012-09-25 VITALS — BP 120/70

## 2012-09-25 DIAGNOSIS — Z01818 Encounter for other preprocedural examination: Secondary | ICD-10-CM | POA: Diagnosis not present

## 2012-09-25 DIAGNOSIS — D259 Leiomyoma of uterus, unspecified: Secondary | ICD-10-CM

## 2012-09-25 DIAGNOSIS — N186 End stage renal disease: Secondary | ICD-10-CM | POA: Diagnosis not present

## 2012-09-25 DIAGNOSIS — N840 Polyp of corpus uteri: Secondary | ICD-10-CM

## 2012-09-25 DIAGNOSIS — D219 Benign neoplasm of connective and other soft tissue, unspecified: Secondary | ICD-10-CM

## 2012-09-25 DIAGNOSIS — K921 Melena: Secondary | ICD-10-CM | POA: Diagnosis not present

## 2012-09-25 NOTE — Progress Notes (Signed)
Patient presented to the office today for preop consultation. Patient was seen in the office for the first time on June 13 for her annual exam.Patient denies any prior history of abnormal Pap smears. Patient has history of end-stage renal disease and goes to dialysis 3 times a week.the patient reported she had history of fibroids in the past had been complaining of low pelvic discomfort.She returned to the office on June 13 for her ultrasound due the fact that she had been complaining of lower abdominal discomfort, feeling bloated, and past history of fibroids. The ultrasound demonstrated the following:   Uterus measuring 9.0 x 6.8 x 4.5 cm with endometrial stripe of 7.1 mm. Patient had 4 fibroids the largest one measuring 26 x 20 mm. A small calcified area was noted on the anterior vaginal wall. Ovaries otherwise appeared to be normal. No free fluid in the cul-de-sac was noted.   Review of patient's radiology study done 11/14/2009 her CT of the abdomen and pelvis the radiological interpretation was as follows:  1. No change in slightly prominent lobular multi fibroid uterus  when compared to MRI of 11/15/2008. No adnexal lesion.  2. Both kidneys appear atrophic   Sonohysterogram demonstrated after injecting normal saline into the uterine cavity there was an anterior defect measuring 20 x 9 mm and a posterior defect measuring 16 x 10 mm.   Pertinent Gynecological History: Menses: post-menopausal Bleeding: postmenopausal no bleeding Contraception: post menopausal status DES exposure: denies Blood transfusions: Uncertain Sexually transmitted diseases: no past history Previous GYN Procedures: None  Last mammogram: normal Date: 2014 Last pap: normal Date: 2013 OB History: G2, P2   Menstrual History: Menarche age: 45  No LMP recorded. Patient is postmenopausal.    Past Medical History  Diagnosis Date  . Renal disorder   . End stage renal disease on dialysis   . End stage renal disease on  dialysis     Past Surgical History  Procedure Laterality Date  . Dg av dialysis graft declot or    . Tubal ligation      Family History  Problem Relation Age of Onset  . Diabetes Mother   . Hypertension Mother   . Heart disease Mother   . Cancer Father     COLON  . Breast cancer Sister 61  . Hypertension Son     Social History:  reports that she has never smoked. She has never used smokeless tobacco. She reports that she does not drink alcohol or use illicit drugs.  Allergies:  Allergies  Allergen Reactions  . Ancef (Cefazolin Sodium)   . Contrast Media (Iodinated Diagnostic Agents)      (Not in a hospital admission)  REVIEW OF SYSTEMS: A ROS was performed and pertinent positives and negatives are included in the history.  GENERAL: No fevers or chills. HEENT: No change in vision, no earache, sore throat or sinus congestion. NECK: No pain or stiffness. CARDIOVASCULAR: No chest pain or pressure. No palpitations. PULMONARY: No shortness of breath, cough or wheeze. GASTROINTESTINAL: No abdominal pain, nausea, vomiting or diarrhea, melena or bright red blood per rectum. GENITOURINARY: No urinary frequency, urgency, hesitancy or dysuria. MUSCULOSKELETAL: No joint or muscle pain, no back pain, no recent trauma. DERMATOLOGIC: No rash, no itching, no lesions. ENDOCRINE: No polyuria, polydipsia, no heat or cold intolerance. No recent change in weight. HEMATOLOGICAL: No anemia or easy bruising or bleeding. NEUROLOGIC: No headache, seizures, numbness, tingling or weakness. PSYCHIATRIC: No depression, no loss of interest in normal activity or change  in sleep pattern.     Blood pressure 120/70.  Physical Exam:  HEENT:unremarkable Neck:Supple, midline, no thyroid megaly, no carotid bruits Lungs:  Clear to auscultation no rhonchi's or wheezes Heart:Regular rate and rhythm, no murmurs or gallops Breast Exam:not examined recently Abdomen:soft pendulous Pelvic:BUSwithin normal  limits Vagina:atrophic no lesions or discharge Cervix:no lesions or discharge Uterus:slightly anteverted no palpable masses or tenderness Adnexa:no palpable mass or tenderness Extremities: No cords, no edema Rectal:deferred  Assessment/Plan: Patient will be scheduled to undergo resectoscopic polypectomy and D&C. The risks benefits and pros and cons of the operation were discussed with the patient as follows:                        Patient was counseled as to the risk of surgery to include the following:  1. Infection (prohylactic antibiotics will be administered)  2. DVT/Pulmonary Embolism (prophylactic pneumo compression stockings will be used)  3.Trauma to internal organs requiring additional surgical procedure to repair any injury to     Internal organs requiring perhaps additional hospitalization days.  4.Hemmorhage requiring transfusion and blood products which carry risks such as  anaphylactic reaction, hepatitis and AIDS  Patient had received literature information on the procedure scheduled and all her questions were answered and fully accepts all risk.  Uvaldo Rising ZP:[id number] AMTD@  FERNANDEZ,JUAN H 09/25/2012, 11:18 AM

## 2012-09-25 NOTE — Patient Instructions (Addendum)
Hysteroscopy Hysteroscopy is a procedure used for looking inside the womb (uterus). It may be done for many different reasons, including:  To evaluate abnormal bleeding, fibroid (benign, noncancerous) tumors, polyps, scar tissue (adhesions), and possibly cancer of the uterus.  To look for lumps (tumors) and other uterine growths.  To look for causes of why a woman cannot get pregnant (infertility), causes of recurrent loss of pregnancy (miscarriages), or a lost intrauterine device (IUD).  To perform a sterilization by blocking the fallopian tubes from inside the uterus. A hysteroscopy should be done right after a menstrual period to be sure you are not pregnant. LET YOUR CAREGIVER KNOW ABOUT:   Allergies.  Medicines taken, including herbs, eyedrops, over-the-counter medicines, and creams.  Use of steroids (by mouth or creams).  Previous problems with anesthetics or numbing medicines.  History of bleeding or blood problems.  History of blood clots.  Possibility of pregnancy, if this applies.  Previous surgery.  Other health problems. RISKS AND COMPLICATIONS   Putting a hole in the uterus.  Excessive bleeding.  Infection.  Damage to the cervix.  Injury to other organs.  Allergic reaction to medicines.  Too much fluid used in the uterus for the procedure. BEFORE THE PROCEDURE   Do not take aspirin or blood thinners for a week before the procedure, or as directed. It can cause bleeding.  Arrive at least 60 minutes before the procedure or as directed to read and sign the necessary forms.  Arrange for someone to take you home after the procedure.  If you smoke, do not smoke for 2 weeks before the procedure. PROCEDURE   Your caregiver may give you medicine to relax you. He or she may also give you a medicine that numbs the area around the cervix (local anesthetic) or a medicine that makes you sleep (general anesthesia).  Sometimes, a medicine is placed in the cervix  the day before the procedure. This medicine makes the cervix have a larger opening (dilate). This makes it easier for the instrument to be inserted into the uterus.  A small instrument (hysteroscope) is inserted through the vagina into the uterus. This instrument is similar to a pencil-sized telescope with a light.  During the procedure, air or a liquid is put into the uterus, which allows the surgeon to see better.  Sometimes, tissue is gently scraped from inside the uterus. These tissue samples are sent to a specialist who looks at tissue samples (pathologist). The pathologist will give a report to your caregiver. This will help your caregiver decide if further treatment is necessary. The report will also help your caregiver decide on the best treatment if the test comes back abnormal. AFTER THE PROCEDURE   If you had a general anesthetic, you may be groggy for a couple hours after the procedure.  If you had a local anesthetic, you will be advised to rest at the surgical center or caregiver's office until you are stable and feel ready to go home.  You may have some cramping for a couple days.  You may have bleeding, which varies from light spotting for a few days to menstrual-like bleeding for up to 3 to 7 days. This is normal.  Have someone take you home. FINDING OUT THE RESULTS OF YOUR TEST Not all test results are available during your visit. If your test results are not back during the visit, make an appointment with your caregiver to find out the results. Do not assume everything is normal if you  have not heard from your caregiver or the medical facility. It is important for you to follow up on all of your test results. HOME CARE INSTRUCTIONS   Do not drive for 24 hours or as instructed.  Only take over-the-counter or prescription medicines for pain, discomfort, or fever as directed by your caregiver.  Do not take aspirin. It can cause or aggravate bleeding.  Do not drive or drink  alcohol while taking pain medicine.  You may resume your usual diet.  Do not use tampons, douche, or have sexual intercourse for 2 weeks, or as advised by your caregiver.  Rest and sleep for the first 24 to 48 hours.  Take your temperature twice a day for 4 to 5 days. Write it down. Give these temperatures to your caregiver if they are abnormal (above 98.6 F or 37.0 C).  Take medicines your caregiver has ordered as directed.  Follow your caregiver's advice regarding diet, exercise, lifting, driving, and general activities.  Take showers instead of baths for 2 weeks, or as recommended by your caregiver.  If you develop constipation:  Take a mild laxative with the advice of your caregiver.  Eat bran foods.  Drink enough water and fluids to keep your urine clear or pale yellow.  Try to have someone with you or available to you for the first 24 to 48 hours, especially if you had a general anesthetic.  Make sure you and your family understand everything about your operation and recovery.  Follow your caregiver's advice regarding follow-up appointments and Pap smears. SEEK MEDICAL CARE IF:   You feel dizzy or lightheaded.  You feel sick to your stomach (nauseous).  You develop abnormal vaginal discharge.  You develop a rash.  You have an abnormal reaction or allergy to your medicine.  You need stronger pain medicine. SEEK IMMEDIATE MEDICAL CARE IF:   Bleeding is heavier than a normal menstrual period or you have blood clots.  You have an oral temperature above 102 F (38.9 C), not controlled by medicine.  You have increasing cramps or pains not relieved with medicine.  You develop belly (abdominal) pain that does not seem to be related to the same area of earlier cramping and pain.  You pass out.  You develop pain in the tops of your shoulders (shoulder strap areas).  You develop shortness of breath. MAKE SURE YOU:   Understand these instructions.  Will watch  your condition.  Will get help right away if you are not doing well or get worse. Document Released: 05/21/2000 Document Revised: 05/07/2011 Document Reviewed: 09/13/2008 [facility] Patient Information 2014 [place], [place].

## 2012-09-26 DIAGNOSIS — N2581 Secondary hyperparathyroidism of renal origin: Secondary | ICD-10-CM | POA: Diagnosis not present

## 2012-09-26 DIAGNOSIS — D631 Anemia in chronic kidney disease: Secondary | ICD-10-CM | POA: Diagnosis not present

## 2012-09-26 DIAGNOSIS — N186 End stage renal disease: Secondary | ICD-10-CM | POA: Diagnosis not present

## 2012-09-26 DIAGNOSIS — D509 Iron deficiency anemia, unspecified: Secondary | ICD-10-CM | POA: Diagnosis not present

## 2012-09-29 ENCOUNTER — Encounter (HOSPITAL_COMMUNITY): Payer: Self-pay | Admitting: Pharmacy Technician

## 2012-09-30 ENCOUNTER — Encounter (HOSPITAL_COMMUNITY)
Admission: RE | Admit: 2012-09-30 | Discharge: 2012-09-30 | Disposition: A | Discharge status: Home / Self Care | Payer: Medicare Other | Source: Ambulatory Visit | Attending: Gynecology | Admitting: Gynecology

## 2012-09-30 ENCOUNTER — Telehealth: Payer: Self-pay

## 2012-09-30 NOTE — Telephone Encounter (Signed)
[facility] Hysteroscopy scheduled for 10/02/12.  Patient presents at [facility] for pre-op with upper respiratory sx. She has scratchy throat, runny nose and oral temp of 99.4.  She is not feeling well and has told Marcie Bal she wants to cancel her surgery and reschedule for when she is well.  I will contact her to reschedule for later date.

## 2012-09-30 NOTE — Pre-Procedure Instructions (Signed)
PAT appt cancelled due to pt being ill with URI. T- 99.4, coughing, scratchy throat.  [name] Marks in Dr. Sandrea Hughs office notified that pt wants to cancel surgery fo 10/02/12

## 2012-10-02 ENCOUNTER — Ambulatory Visit (HOSPITAL_COMMUNITY): Admission: RE | Admit: 2012-10-02 | Payer: Medicare Other | Source: Ambulatory Visit | Admitting: Gynecology

## 2012-10-02 ENCOUNTER — Encounter (HOSPITAL_COMMUNITY): Admission: RE | Payer: Self-pay | Source: Ambulatory Visit

## 2012-10-02 SURGERY — DILATATION & CURETTAGE/HYSTEROSCOPY WITH TRUCLEAR
Anesthesia: General

## 2012-10-09 ENCOUNTER — Encounter (HOSPITAL_COMMUNITY): Payer: Self-pay | Admitting: Emergency Medicine

## 2012-10-09 ENCOUNTER — Emergency Department (INDEPENDENT_AMBULATORY_CARE_PROVIDER_SITE_OTHER)
Admission: EM | Admit: 2012-10-09 | Discharge: 2012-10-09 | Disposition: A | Discharge status: Home / Self Care | Payer: Medicare Other | Source: Home / Self Care

## 2012-10-09 ENCOUNTER — Emergency Department (INDEPENDENT_AMBULATORY_CARE_PROVIDER_SITE_OTHER): Payer: Medicare Other

## 2012-10-09 DIAGNOSIS — J069 Acute upper respiratory infection, unspecified: Secondary | ICD-10-CM

## 2012-10-09 DIAGNOSIS — R059 Cough, unspecified: Secondary | ICD-10-CM | POA: Diagnosis not present

## 2012-10-09 DIAGNOSIS — R05 Cough: Secondary | ICD-10-CM | POA: Diagnosis not present

## 2012-10-09 MED ORDER — GUAIFENESIN-CODEINE 100-10 MG/5ML PO SOLN: 5.0000 mL | Freq: Three times a day (TID) | ORAL | Status: DC | PRN

## 2012-10-09 NOTE — ED Provider Notes (Signed)
Elysha A [name] is a 57 y.o. female who presents to Urgent Care today for several weeks of a mildly worsening mildly productive cough associated on last 2 days with sore throat and chills. She denies any dyspnea fevers chest pains or palpitations. She has not tried any medications yet. She's not sure she's ever had any sick contacts. She denies any nausea vomiting or diarrhea.   PMH reviewed. Chronic kidney disease on dialysis History  Substance Use Topics  . Smoking status: Never Smoker   . Smokeless tobacco: Never Used  . Alcohol Use: No   ROS as above Medications reviewed. No current facility-administered medications for this encounter.   Current Outpatient Prescriptions  Medication Sig Dispense Refill  . cinacalcet (SENSIPAR) 60 MG tablet Take 60 mg by mouth daily.      . Famotidine (PEPCID PO) Take 1 tablet by mouth daily.      . midodrine (PROAMATINE) 10 MG tablet Take 10 mg by mouth daily.      . multivitamin (RENA-VIT) TABS tablet Take 1 tablet by mouth daily.      . sevelamer (RENVELA) 800 MG tablet Take 1,600 mg by mouth 3 (three) times daily with meals.       Marland Kitchen guaiFENesin-codeine 100-10 MG/5ML syrup Take 5 mL by mouth 3 (three) times daily as needed for cough.  120 mL  0    Exam:  BP 84/53  Pulse 76  Temp(Src) 98.1 F (36.7 C) (Oral)  Resp 16  SpO2 95% Gen: Well NAD HEENT: EOMI,  MMM, erythematous posterior pharynx. Normal-appearing tympanic membranes bilaterally Lungs: CTABL Nl WOB Heart: RRR no MRG Abd: NABS, NT, ND Exts: Non edematous BL  LE, warm and well perfused.   No results found for this or any previous visit (from the past 24 hour(s)). Dg Chest 2 View  10/09/2012   *RADIOLOGY REPORT*  Clinical Data: Cough  CHEST - 2 VIEW  Comparison: December 01, 2010  Findings: Lungs clear.  Heart size and pulmonary vascularity are normal.  No adenopathy.  There are axillary regions stents bilaterally.  There is degenerative change in the thoracic spine.  IMPRESSION:  Axillary regions stents bilaterally.  No edema or consolidation.   Original Report Authenticated By: Lowella Grip, M.D.    Assessment and Plan: 57 y.o. female with viral URI with cough.  Plan to treat symptomatically with Tylenol. Additionally will use codeine containing cough medication as needed.  Followup with primary care provider if not improving.  Discussed warning signs or symptoms. Please see discharge instructions. Patient expresses understanding.      Gregor Hams, MD 10/09/12 [phone]

## 2012-10-09 NOTE — ED Notes (Signed)
Pt c/o cold sxs onset 2 weeks sxs include: cough w/clear phlegm, runny nose, ST, chills, nausea... sxs are worse at night Denies: f/d... Not taking any meds Alert w/no signs of acute distress.

## 2012-10-13 ENCOUNTER — Emergency Department (INDEPENDENT_AMBULATORY_CARE_PROVIDER_SITE_OTHER)
Admission: EM | Admit: 2012-10-13 | Discharge: 2012-10-13 | Disposition: A | Discharge status: Home / Self Care | Payer: Medicare Other | Source: Home / Self Care | Attending: Emergency Medicine | Admitting: Emergency Medicine

## 2012-10-13 ENCOUNTER — Encounter (HOSPITAL_COMMUNITY): Payer: Self-pay | Admitting: Emergency Medicine

## 2012-10-13 DIAGNOSIS — J019 Acute sinusitis, unspecified: Secondary | ICD-10-CM | POA: Diagnosis not present

## 2012-10-13 DIAGNOSIS — J209 Acute bronchitis, unspecified: Secondary | ICD-10-CM | POA: Diagnosis not present

## 2012-10-13 LAB — POCT RAPID STREP A: Streptococcus, Group A Screen (Direct): NEGATIVE

## 2012-10-13 MED ORDER — AMOXICILLIN-POT CLAVULANATE 500-125 MG PO TABS: ORAL_TABLET | ORAL | Status: DC

## 2012-10-13 NOTE — ED Notes (Signed)
C/o sore throat and cough.  Patient was here recently and was told to come back if not feeling well.

## 2012-10-13 NOTE — ED Provider Notes (Signed)
Chief Complaint:   Chief Complaint  Patient presents with  . Sore Throat  . Cough    History of Present Illness:   [name] Marks is a 57 year old female with chronic kidney disease who is on dialysis. She presents with a two-week history of upper respiratory symptoms. She was seen here 4 days ago and had a chest x-ray which was normal, however her symptoms have persisted. She complains of a two-week history of sore throat, chills, cough productive yellow sputum, chest tightness, wheezing, nasal congestion with clear and bloody drainage, sneezing, headache, ear congestion, and occasional nausea and vomiting. She has been given a cough medicine but has not experienced much relief.   Review of Systems:  Other than noted above, the patient denies any of the following symptoms: Systemic:  No fevers, chills, sweats, weight loss or gain, fatigue, or tiredness. Eye:  No redness or discharge. ENT:  No ear pain, drainage, headache, nasal congestion, drainage, sinus pressure, difficulty swallowing, or sore throat. Neck:  No neck pain or swollen glands. Lungs:  No cough, sputum production, hemoptysis, wheezing, chest tightness, shortness of breath or chest pain. GI:  No abdominal pain, nausea, vomiting or diarrhea.  [place]:  Past medical history, family history, social history, meds, and allergies were reviewed.   Physical Exam:   Vital signs:  BP 114/71  Pulse 83  Temp(Src) 98.1 F (36.7 C) (Oral)  Resp 16  SpO2 100% General:  Alert and oriented.  In no distress.  Skin warm and dry. Eye:  No conjunctival injection or drainage. Lids were normal. ENT:  TMs and canals were normal, without erythema or inflammation.  Nasal mucosa was clear and uncongested, without drainage.  Mucous membranes were moist.  Pharynx was clear with no exudate or drainage.  There were no oral ulcerations or lesions. Neck:  Supple, no adenopathy, tenderness or mass. Lungs:  No respiratory distress.  Lungs were clear to  auscultation, without wheezes, rales or rhonchi.  Breath sounds were clear and equal bilaterally.  Heart:  Regular rhythm, without gallops, murmers or rubs. Skin:  Clear, warm, and dry, without rash or lesions.  Labs:   Results for orders placed during the hospital encounter of 10/13/12  POCT RAPID STREP A ([employer])      Result Value Range   Streptococcus, Group A Screen (Direct) NEGATIVE  NEGATIVE     Radiology:  Dg Chest 2 View  10/09/2012   *RADIOLOGY REPORT*  Clinical Data: Cough  CHEST - 2 VIEW  Comparison: December 01, 2010  Findings: Lungs clear.  Heart size and pulmonary vascularity are normal.  No adenopathy.  There are axillary regions stents bilaterally.  There is degenerative change in the thoracic spine.  IMPRESSION: Axillary regions stents bilaterally.  No edema or consolidation.   Original Report Authenticated By: Lowella Grip, M.D.   Assessment:  The primary encounter diagnosis was Acute bronchitis. A diagnosis of Acute sinusitis was also pertinent to this visit.  After being sick for 2 weeks, I think it's time to go ahead with antibiotic treatment. Since she is on dialysis we can treat with amoxicillin/clavulanate 500/12.5 for 10 days.  Plan:   1.  The following meds were prescribed:   Discharge Medication List as of 10/13/2012  6:37 PM    START taking these medications   Details  amoxicillin-clavulanate (AUGMENTIN) 500-125 MG per tablet Take 1 BID, Normal       2.  The patient was instructed in symptomatic care and handouts  were given. 3.  The patient was told to return if becoming worse in any way, if no better in 3 or 4 days, and given some red flag symptoms such as fever or difficulty breathing that would indicate earlier return. 4.  Follow up here if needed.      Harden Mo, MD 10/13/12 [phone]

## 2012-10-15 LAB — CULTURE, GROUP A STREP

## 2012-10-16 ENCOUNTER — Ambulatory Visit: Payer: Medicare Other | Admitting: Gynecology

## 2012-10-26 DIAGNOSIS — N186 End stage renal disease: Secondary | ICD-10-CM | POA: Diagnosis not present

## 2012-10-27 DIAGNOSIS — N2581 Secondary hyperparathyroidism of renal origin: Secondary | ICD-10-CM | POA: Diagnosis not present

## 2012-10-27 DIAGNOSIS — D631 Anemia in chronic kidney disease: Secondary | ICD-10-CM | POA: Diagnosis not present

## 2012-10-27 DIAGNOSIS — N186 End stage renal disease: Secondary | ICD-10-CM | POA: Diagnosis not present

## 2012-10-27 DIAGNOSIS — D509 Iron deficiency anemia, unspecified: Secondary | ICD-10-CM | POA: Diagnosis not present

## 2012-11-04 ENCOUNTER — Other Ambulatory Visit (HOSPITAL_COMMUNITY): Payer: Medicare Other

## 2012-11-04 ENCOUNTER — Encounter (HOSPITAL_COMMUNITY)
Admission: RE | Admit: 2012-11-04 | Discharge: 2012-11-04 | Disposition: A | Discharge status: Home / Self Care | Payer: Medicare Other | Source: Ambulatory Visit | Attending: Gynecology | Admitting: Gynecology

## 2012-11-04 ENCOUNTER — Encounter (HOSPITAL_COMMUNITY): Payer: Self-pay

## 2012-11-04 DIAGNOSIS — N840 Polyp of corpus uteri: Secondary | ICD-10-CM | POA: Diagnosis not present

## 2012-11-04 DIAGNOSIS — N186 End stage renal disease: Secondary | ICD-10-CM | POA: Diagnosis not present

## 2012-11-04 HISTORY — DX: Other complications of anesthesia, initial encounter: T88.59XA

## 2012-11-04 HISTORY — DX: Adverse effect of unspecified anesthetic, initial encounter: T41.45XA

## 2012-11-04 NOTE — Pre-Procedure Instructions (Signed)
Call made to [facility]. Dialysis center for [facility] does not accept MD orders from physicians outside of their practice. Information given to [name] Marks, he will call physician to physician to discuss post dialysis lab request.

## 2012-11-04 NOTE — Pre-Procedure Instructions (Signed)
Arrangements made for post dialysis labs by Dr.Kyle Glennon Mac and Corliss Parish, MD. If no results obtained by 9/11, call center for results; [phone] per Dr Glennon Mac.

## 2012-11-04 NOTE — Pre-Procedure Instructions (Signed)
No urinalysis obtained, pt has no urine output (on dialysis).

## 2012-11-04 NOTE — Pre-Procedure Instructions (Signed)
Pt. History discussed with Dr Lyndle Herrlich. No need for CBC today, we need post dialysis CBC/Lytes instead. I will contact [facility] to arrange lab work for post dialysis tomorrow.

## 2012-11-04 NOTE — Patient Instructions (Addendum)
20 Anitha A [name]  11/04/2012   Your procedure is scheduled on:  9/11  Enter through the Main Entrance of [facility] at [place] up the phone at the desk and dial [phone].   Call this number if you have problems the morning of surgery: [phone]   Remember:   Do not eat food:After Midnight.  Do not drink clear liquids: 4 Hours before arrival.  Take these medicines the morning of surgery with A SIP OF WATER: Midadrine   Do not wear jewelry, make-up or nail polish.  Do not wear lotions, powders, or perfumes. You may wear deodorant.  Do not shave 48 hours prior to surgery.  Do not bring valuables to the hospital.  [facility] is not   responsible for any belongings or valuables brought to the hospital.  Contacts, dentures or bridgework may not be worn into surgery.  Leave suitcase in the car. After surgery it may be brought to your room.  For patients admitted to the hospital, checkout time is 11:00 AM the day of              discharge.   Patients discharged the day of surgery will not be allowed to drive             home.  Name and phone number of your driver: Donnetta Simpers  Special Instructions:   Shower using CHG 2 nights before surgery and the night before surgery.  If you shower the day of surgery use CHG.  Use special wash - you have one bottle of CHG for all showers.  You should use approximately 1/3 of the bottle for each shower.   Please read over the following fact sheets that you were given:   Surgical Site Infection Prevention

## 2012-11-05 MED ORDER — GENTAMICIN SULFATE 40 MG/ML IJ SOLN
INTRAMUSCULAR | Status: DC
  Filled 2012-11-05: qty 12.25

## 2012-11-05 NOTE — H&P (Signed)
Patient was seen in the office for the first time on June 13 for her annual exam.Patient denies any prior history of abnormal Pap smears. Patient has history of end-stage renal disease and goes to dialysis 3 times a week.the patient reported she had history of fibroids in the past had been complaining of low pelvic discomfort.She returned to the office on June 13 for her ultrasound due the fact that she had been complaining of lower abdominal discomfort, feeling bloated, and past history of fibroids. The ultrasound demonstrated the following:  Uterus measuring 9.0 x 6.8 x 4.5 cm with endometrial stripe of 7.1 mm. Patient had 4 fibroids the largest one measuring 26 x 20 mm. A small calcified area was noted on the anterior vaginal wall. Ovaries otherwise appeared to be normal. No free fluid in the cul-de-sac was noted.  Review of patient's radiology study done 11/14/2009 her CT of the abdomen and pelvis the radiological interpretation was as follows:  1. No change in slightly prominent lobular multi fibroid uterus  when compared to MRI of 11/15/2008. No adnexal lesion.  2. Both kidneys appear atrophic  Sonohysterogram demonstrated after injecting normal saline into the uterine cavity there was an anterior defect measuring 20 x 9 mm and a posterior defect measuring 16 x 10 mm.  Pertinent Gynecological History:  Menses: post-menopausal  Bleeding: postmenopausal no bleeding  Contraception: post menopausal status  DES exposure: denies  Blood transfusions: Uncertain  Sexually transmitted diseases: no past history  Previous GYN Procedures: None  Last mammogram: normal Date: 2014  Last pap: normal Date: 2013  OB History: G2, P2  Menstrual History:  Menarche age: 40  No LMP recorded. Patient is postmenopausal.  Past Medical History   Diagnosis  Date   .  Renal disorder    .  End stage renal disease on dialysis    .  End stage renal disease on dialysis     Past Surgical History   Procedure   Laterality  Date   .  Dg av dialysis graft declot or     .  Tubal ligation      Family History   Problem  Relation  Age of Onset   .  Diabetes  Mother    .  Hypertension  Mother    .  Heart disease  Mother    .  Cancer  Father      COLON   .  Breast cancer  Sister  26   .  Hypertension  Son    Social History: reports that she has never smoked. She has never used smokeless tobacco. She reports that she does not drink alcohol or use illicit drugs.  Allergies:  Allergies   Allergen  Reactions   .  Ancef (Cefazolin Sodium)    .  Contrast Media (Iodinated Diagnostic Agents)    (Not in a hospital admission)  REVIEW OF SYSTEMS: A ROS was performed and pertinent positives and negatives are included in the history.  GENERAL: No fevers or chills. HEENT: No change in vision, no earache, sore throat or sinus congestion. NECK: No pain or stiffness. CARDIOVASCULAR: No chest pain or pressure. No palpitations. PULMONARY: No shortness of breath, cough or wheeze. GASTROINTESTINAL: No abdominal pain, nausea, vomiting or diarrhea, melena or bright red blood per rectum. GENITOURINARY: No urinary frequency, urgency, hesitancy or dysuria. MUSCULOSKELETAL: No joint or muscle pain, no back pain, no recent trauma. DERMATOLOGIC: No rash, no itching, no lesions. ENDOCRINE: No polyuria, polydipsia, no heat or cold  intolerance. No recent change in weight. HEMATOLOGICAL: No anemia or easy bruising or bleeding. NEUROLOGIC: No headache, seizures, numbness, tingling or weakness. PSYCHIATRIC: No depression, no loss of interest in normal activity or change in sleep pattern.  Blood pressure 120/70.  Physical Exam:  HEENT:unremarkable  Neck:Supple, midline, no thyroid megaly, no carotid bruits  Lungs: Clear to auscultation no rhonchi's or wheezes  Heart:Regular rate and rhythm, no murmurs or gallops  Breast Exam:not examined recently  Abdomen:soft pendulous  Pelvic:BUSwithin normal limits  Vagina:atrophic no lesions or  discharge  Cervix:no lesions or discharge  Uterus:slightly anteverted no palpable masses or tenderness  Adnexa:no palpable mass or tenderness  Extremities: No cords, no edema  Rectal:deferred  Assessment/Plan:  Patient will be scheduled to undergo resectoscopic polypectomy and D&C. The risks benefits and pros and cons of the operation were discussed with the patient as follows:  Patient was counseled as to the risk of surgery to include the following:  1. Infection (prohylactic antibiotics will be administered)  2. DVT/Pulmonary Embolism (prophylactic pneumo compression stockings will be used)  3.Trauma to internal organs requiring additional surgical procedure to repair any injury to  Internal organs requiring perhaps additional hospitalization days.  4.Hemmorhage requiring transfusion and blood products which carry risks such as anaphylactic reaction, hepatitis and AIDS  Patient had received literature information on the procedure scheduled and all her questions were answered and fully accepts all risk.

## 2012-11-06 ENCOUNTER — Encounter (HOSPITAL_COMMUNITY): Payer: Self-pay | Admitting: Anesthesiology

## 2012-11-06 ENCOUNTER — Encounter (HOSPITAL_COMMUNITY)
Admission: RE | Disposition: A | Discharge status: Home / Self Care | Payer: Self-pay | Source: Ambulatory Visit | Attending: Gynecology

## 2012-11-06 ENCOUNTER — Ambulatory Visit (HOSPITAL_COMMUNITY): Payer: Medicare Other | Admitting: Anesthesiology

## 2012-11-06 ENCOUNTER — Ambulatory Visit (HOSPITAL_COMMUNITY)
Admission: RE | Admit: 2012-11-06 | Discharge: 2012-11-06 | Disposition: A | Discharge status: Home / Self Care | Payer: Medicare Other | Source: Ambulatory Visit | Attending: Gynecology | Admitting: Gynecology

## 2012-11-06 DIAGNOSIS — N186 End stage renal disease: Secondary | ICD-10-CM | POA: Insufficient documentation

## 2012-11-06 DIAGNOSIS — N840 Polyp of corpus uteri: Secondary | ICD-10-CM | POA: Diagnosis not present

## 2012-11-06 DIAGNOSIS — Z9889 Other specified postprocedural states: Secondary | ICD-10-CM

## 2012-11-06 HISTORY — PX: DILATATION & CURETTAGE/HYSTEROSCOPY WITH TRUECLEAR: SHX6353

## 2012-11-06 SURGERY — DILATATION & CURETTAGE/HYSTEROSCOPY WITH TRUCLEAR
Anesthesia: General

## 2012-11-06 MED ORDER — MIDAZOLAM HCL 2 MG/2ML IJ SOLN
INTRAMUSCULAR | Status: DC | PRN
  Administered 2012-11-06: 1 mg via INTRAVENOUS

## 2012-11-06 MED ORDER — PHENYLEPHRINE 40 MCG/ML (10ML) SYRINGE FOR IV PUSH (FOR BLOOD PRESSURE SUPPORT)
PREFILLED_SYRINGE | INTRAVENOUS | Status: AC
  Filled 2012-11-06: qty 5

## 2012-11-06 MED ORDER — FAMOTIDINE 20 MG PO TABS
ORAL_TABLET | ORAL | Status: AC
  Administered 2012-11-06: 20 mg via ORAL
  Filled 2012-11-06: qty 1

## 2012-11-06 MED ORDER — SODIUM CHLORIDE 0.9 % IV SOLN
INTRAVENOUS | Status: DC
  Administered 2012-11-06: 12:00:00 via INTRAVENOUS

## 2012-11-06 MED ORDER — ONDANSETRON HCL 4 MG/2ML IJ SOLN
INTRAMUSCULAR | Status: DC | PRN
  Administered 2012-11-06: 4 mg via INTRAVENOUS

## 2012-11-06 MED ORDER — GENTAMICIN SULFATE 40 MG/ML IJ SOLN
Freq: Once | INTRAVENOUS | Status: AC
  Administered 2012-11-06: 100 mL via INTRAVENOUS
  Filled 2012-11-06: qty 3.75

## 2012-11-06 MED ORDER — FAMOTIDINE 20 MG PO TABS
20.0000 mg | ORAL_TABLET | Freq: Once | ORAL | Status: AC
  Administered 2012-11-06: 20 mg via ORAL

## 2012-11-06 MED ORDER — PHENYLEPHRINE HCL 10 MG/ML IJ SOLN
INTRAMUSCULAR | Status: DC | PRN
  Administered 2012-11-06: 40 ug via INTRAVENOUS

## 2012-11-06 MED ORDER — METOCLOPRAMIDE HCL 10 MG PO TABS: 10.0000 mg | ORAL_TABLET | Freq: Three times a day (TID) | ORAL | Status: DC

## 2012-11-06 MED ORDER — FENTANYL CITRATE 0.05 MG/ML IJ SOLN
INTRAMUSCULAR | Status: DC | PRN
  Administered 2012-11-06: 50 ug via INTRAVENOUS

## 2012-11-06 MED ORDER — PROPOFOL INFUSION 10 MG/ML OPTIME
INTRAVENOUS | Status: DC | PRN
  Administered 2012-11-06: 110 mL via INTRAVENOUS

## 2012-11-06 MED ORDER — SILVER NITRATE-POT NITRATE 75-25 % EX MISC
CUTANEOUS | Status: DC | PRN
  Administered 2012-11-06: 3

## 2012-11-06 MED ORDER — SODIUM CHLORIDE 0.9 % IR SOLN
Status: DC | PRN
  Administered 2012-11-06: 3000 mL

## 2012-11-06 MED ORDER — MIDAZOLAM HCL 2 MG/2ML IJ SOLN
INTRAMUSCULAR | Status: AC
  Filled 2012-11-06: qty 2

## 2012-11-06 MED ORDER — PROPOFOL 10 MG/ML IV BOLUS: INTRAVENOUS | Status: DC | PRN

## 2012-11-06 MED ORDER — LIDOCAINE HCL (CARDIAC) 20 MG/ML IV SOLN
INTRAVENOUS | Status: DC | PRN
  Administered 2012-11-06: 50 mg via INTRAVENOUS

## 2012-11-06 MED ORDER — FENTANYL CITRATE 0.05 MG/ML IJ SOLN
INTRAMUSCULAR | Status: AC
  Filled 2012-11-06: qty 2

## 2012-11-06 MED ORDER — PHENYLEPHRINE HCL 10 MG/ML IJ SOLN
INTRAMUSCULAR | Status: DC | PRN
  Administered 2012-11-06 (×2): 40 ug via INTRAVENOUS

## 2012-11-06 SURGICAL SUPPLY — 27 items
BLADE INCISOR TRUC PLUS 2.9 (ABLATOR) ×1 IMPLANT
CANISTERS HI-FLOW 3000CC (CANNISTER) ×2 IMPLANT
CATH FOLEY 2WAY SLVR 30CC 16FR (CATHETERS) IMPLANT
CATH ROBINSON RED A/P 16FR (CATHETERS) ×2 IMPLANT
CLOTH BEACON ORANGE TIMEOUT ST (SAFETY) ×2 IMPLANT
CONTAINER PREFILL 10% NBF 60ML (FORM) ×4 IMPLANT
CORD ACTIVE DISPOSABLE (ELECTRODE)
CORD ELECTRO ACTIVE DISP (ELECTRODE) IMPLANT
DRAPE HYSTEROSCOPY (DRAPE) ×2 IMPLANT
DRESSING TELFA 8X3 (GAUZE/BANDAGES/DRESSINGS) ×2 IMPLANT
ELECT REM PT RETURN 9FT ADLT (ELECTROSURGICAL)
ELECT VAPORTRODE GRVD BAR (ELECTRODE) IMPLANT
ELECTRODE REM PT RTRN 9FT ADLT (ELECTROSURGICAL) IMPLANT
GLOVE BIOGEL PI IND STRL 8 (GLOVE) ×1 IMPLANT
GLOVE BIOGEL PI INDICATOR 8 (GLOVE) ×1
GLOVE ECLIPSE 7.5 STRL STRAW (GLOVE) ×4 IMPLANT
GOWN STRL REIN XL XLG (GOWN DISPOSABLE) ×4 IMPLANT
INCISOR TRUC PLUS BLADE 2.9 (ABLATOR) ×2
KIT HYSTEROSCOPY TRUCLEAR (ABLATOR) ×2 IMPLANT
MORCELLATOR RECIP TRUCLEAR 4.0 (ABLATOR) IMPLANT
PACK VAGINAL MINOR WOMEN LF (CUSTOM PROCEDURE TRAY) ×2 IMPLANT
PAD OB MATERNITY 4.3X12.25 (PERSONAL CARE ITEMS) ×2 IMPLANT
PAD PREP 24X48 CUFFED NSTRL (MISCELLANEOUS) ×2 IMPLANT
PLUG CATH AND CAP STER (CATHETERS) IMPLANT
SYR 30ML LL (SYRINGE) IMPLANT
TOWEL OR 17X24 6PK STRL BLUE (TOWEL DISPOSABLE) ×4 IMPLANT
WATER STERILE IRR 1000ML POUR (IV SOLUTION) ×2 IMPLANT

## 2012-11-06 NOTE — Transfer of Care (Signed)
Immediate Anesthesia Transfer of Care Note  Patient: [name] Marks  Procedure(s) Performed: Procedure(s) with comments: DILATATION & CURETTAGE/HYSTEROSCOPY WITH TRUECLEAR (N/A) - Truclear Resectoscopic Polypectomy    Patient Location: PACU  Anesthesia Type:General  Level of Consciousness: awake, oriented and patient cooperative  Airway & Oxygen Therapy: Patient Spontanous Breathing and Patient connected to nasal cannula oxygen  Post-op Assessment: Report given to PACU RN and Post -op Vital signs reviewed and stable  Post vital signs: Reviewed and stable  Complications: No apparent anesthesia complications

## 2012-11-06 NOTE — Anesthesia Preprocedure Evaluation (Addendum)
Anesthesia Evaluation  Patient identified by MRN, date of birth, ID band Patient awake    Reviewed: Allergy & Precautions, H&P , Patient's Chart, lab work & pertinent test results, reviewed documented beta blocker date and time   Airway Mallampati: II TM Distance: >3 FB Neck ROM: full    Dental no notable dental hx.    Pulmonary asthma (Rare inhaler use) , Recent URI  (Felt well by end of August), Resolved,  breath sounds clear to auscultation  Pulmonary exam normal       Cardiovascular Rhythm:regular Rate:Normal     Neuro/Psych    GI/Hepatic GERD-  Medicated,  Endo/Other    Renal/GU ESRF and Dialysis     Musculoskeletal   Abdominal   Peds  Hematology   Anesthesia Other Findings K+ 2.8 after dialysis  Reproductive/Obstetrics                         Anesthesia Physical Anesthesia Plan  ASA: II  Anesthesia Plan:    Post-op Pain Management:    Induction: Intravenous  Airway Management Planned: LMA  Additional Equipment:   Intra-op Plan:   Post-operative Plan:   Informed Consent: I have reviewed the patients History and Physical, chart, labs and discussed the procedure including the risks, benefits and alternatives for the proposed anesthesia with the patient or authorized representative who has indicated his/her understanding and acceptance.   Dental Advisory Given and Dental advisory given  Plan Discussed with: CRNA and Surgeon  Anesthesia Plan Comments:         Anesthesia Quick Evaluation

## 2012-11-06 NOTE — Anesthesia Postprocedure Evaluation (Signed)
  Anesthesia Post Note  Patient: [name] Marks  Procedure(s) Performed: Procedure(s) (LRB): DILATATION & CURETTAGE/HYSTEROSCOPY WITH TRUECLEAR (N/A)  Anesthesia type: GA  Patient location: PACU  Post pain: Pain level controlled  Post assessment: Post-op Vital signs reviewed  Last Vitals:  Filed Vitals:   11/06/12 1400  BP: 111/56  Pulse: 75  Temp: 36.6 C  Resp: 20    Post vital signs: Reviewed  Level of consciousness: sedated  Complications: No apparent anesthesia complications

## 2012-11-06 NOTE — Op Note (Signed)
11/06/2012  1:53 PM  PATIENT:  [name] Marks  57 y.o. female  PRE-OPERATIVE DIAGNOSIS:  endometrial polyp  POST-OPERATIVE DIAGNOSIS:  endpmetrial polyp  PROCEDURE:  Procedure(s): DILATATION & CURETTAGE/HYSTEROSCOPY WITH TRUECLEAR, resectoscopic polypectomy   SURGEON:  Surgeon(s): Terrance Mass, MD  ANESTHESIA:   general  FINDINGS: Large endometrial anterior polyp, smaller posterior uterine polyp. Lush endometrium. Only right tubal ostia identified  DESCRIPTION OF OPERATION: Patient was taken to the operating room where she underwent a successful general endotracheal anesthesia. Patient had received gentamicin at a lower dose along with clindamycin because of patient's history of renal failure. PAS stockings were placed for DVT prophylaxis. A timeout was undertaken to identify the patient and the procedure to be undertaken. Patient was then placed in the high lithotomy position. The vagina and perineum were prepped and draped in usual sterile fashion. A bimanual examination demonstrated irregular shaped fibroid uterus approximately 8-to 10 week size. The Pratt dilators were utilized to dilate the cervical canal up to a size 21 mm. The [facility] operative hysteroscope was introduced into the uterine cavity along with a 2.9 mm reciprocating blade to morcellate endometrial polyps. Normal saline was the distending media. In a systematic fashion the entire cavity was cleared of endometrial polyps and a lush endometrium all specimens submitted for histological evaluation. The single-tooth tenaculum was removed patient was extubated transferred to recovery with stable vital signs. Blood loss was minimal and fluid deficit from sodium chloride was only 80 cc.  ESTIMATED BLOOD LOSS: Minimal  Intake/Output Summary (Last 24 hours) at 11/06/12 1353 Last data filed at 11/06/12 1315  Gross per 24 hour  Intake    200 ml  Output      0 ml  Net    200 ml     BLOOD ADMINISTERED:none   LOCAL  MEDICATIONS USED:  NONE  SPECIMEN:  Source of Specimen:  Endometrial polyps and curettings  DISPOSITION OF SPECIMEN:  PATHOLOGY  COUNTS:  YES  PLAN OF CARE: Transfer to [place] HMD1:53 PMTD@

## 2012-11-06 NOTE — Interval H&P Note (Signed)
History and Physical Interval Note:  11/06/2012 12:54 PM  [name] Marks  has presented today for surgery, with the diagnosis of endometrial polyp  The various methods of treatment have been discussed with the patient and family. After consideration of risks, benefits and other options for treatment, the patient has consented to  Procedure(s) with comments: [place] (N/A) - Truclear Resectoscopic Polypectomy   as a surgical intervention .  The patient's history has been reviewed, patient examined, no change in status, stable for surgery.  I have reviewed the patient's chart and labs.  Questions were answered to the patient's satisfaction.     Terrance Mass

## 2012-11-07 ENCOUNTER — Encounter (HOSPITAL_COMMUNITY): Payer: Self-pay | Admitting: Gynecology

## 2012-11-20 ENCOUNTER — Encounter: Payer: Self-pay | Admitting: Gynecology

## 2012-11-20 ENCOUNTER — Ambulatory Visit (INDEPENDENT_AMBULATORY_CARE_PROVIDER_SITE_OTHER): Payer: Medicare Other | Admitting: Gynecology

## 2012-11-20 VITALS — BP 118/74

## 2012-11-20 DIAGNOSIS — Z09 Encounter for follow-up examination after completed treatment for conditions other than malignant neoplasm: Secondary | ICD-10-CM

## 2012-11-20 NOTE — Progress Notes (Signed)
Postmenopausal patient presented to the office for postop visit. Patient is status post resectoscopic polypectomy on 11/06/2012. Patient has done well from her surgery. Patient with no complaints today. Findings at time of surgery as follows:  Large endometrial anterior polyp, smaller posterior uterine polyp. Lush endometrium. Only right tubal ostia identified  Pictures from surgery shared with the patient today.  Pathology report: Endometrial polyp - FRAGMENTS OF ENDOMETRIAL TYPE POLYP. - BENIGN SMOOTH MUSCLE PRESENT, SEE COMMENT. - NO ATYPIA OR MALIGNANCY IDENTIFIED. Microscopic Comment The benign smooth muscle present likely represents underlying benign myometrium.  Exam: Abdomen: Soft nontender no rebound or guarding Pelvic: Bartholin urethra Skene was within normal limits Vagina: No lesions or discharge Cervix: No lesions or discharge Uterus: Anteverted normal size shape and consistency Adnexa: No palpable masses or tenderness Rectal exam not done  Assessment/plan: Patient status post resectoscopic polypectomy 2 weeks ago doing well. Patient has resumed for normal activity. Patient otherwise scheduled to return to the office next year for annual exam or when necessary.

## 2012-11-25 DIAGNOSIS — N186 End stage renal disease: Secondary | ICD-10-CM | POA: Diagnosis not present

## 2012-11-26 DIAGNOSIS — D631 Anemia in chronic kidney disease: Secondary | ICD-10-CM | POA: Diagnosis not present

## 2012-11-26 DIAGNOSIS — N2581 Secondary hyperparathyroidism of renal origin: Secondary | ICD-10-CM | POA: Diagnosis not present

## 2012-11-26 DIAGNOSIS — D509 Iron deficiency anemia, unspecified: Secondary | ICD-10-CM | POA: Diagnosis not present

## 2012-11-26 DIAGNOSIS — Z23 Encounter for immunization: Secondary | ICD-10-CM | POA: Diagnosis not present

## 2012-11-26 DIAGNOSIS — N186 End stage renal disease: Secondary | ICD-10-CM | POA: Diagnosis not present

## 2012-12-01 ENCOUNTER — Ambulatory Visit: Payer: Self-pay | Admitting: Vascular Surgery

## 2012-12-01 DIAGNOSIS — Z992 Dependence on renal dialysis: Secondary | ICD-10-CM | POA: Diagnosis not present

## 2012-12-01 DIAGNOSIS — E119 Type 2 diabetes mellitus without complications: Secondary | ICD-10-CM | POA: Diagnosis not present

## 2012-12-01 DIAGNOSIS — I120 Hypertensive chronic kidney disease with stage 5 chronic kidney disease or end stage renal disease: Secondary | ICD-10-CM | POA: Diagnosis not present

## 2012-12-01 DIAGNOSIS — I10 Essential (primary) hypertension: Secondary | ICD-10-CM | POA: Diagnosis not present

## 2012-12-01 DIAGNOSIS — N186 End stage renal disease: Secondary | ICD-10-CM | POA: Diagnosis not present

## 2012-12-01 DIAGNOSIS — T82898A Other specified complication of vascular prosthetic devices, implants and grafts, initial encounter: Secondary | ICD-10-CM | POA: Diagnosis not present

## 2012-12-15 ENCOUNTER — Ambulatory Visit: Payer: Self-pay | Admitting: Vascular Surgery

## 2012-12-15 DIAGNOSIS — Z79899 Other long term (current) drug therapy: Secondary | ICD-10-CM | POA: Diagnosis not present

## 2012-12-15 DIAGNOSIS — T82898A Other specified complication of vascular prosthetic devices, implants and grafts, initial encounter: Secondary | ICD-10-CM | POA: Diagnosis not present

## 2012-12-15 DIAGNOSIS — Z992 Dependence on renal dialysis: Secondary | ICD-10-CM | POA: Diagnosis not present

## 2012-12-15 DIAGNOSIS — N186 End stage renal disease: Secondary | ICD-10-CM | POA: Diagnosis not present

## 2012-12-15 DIAGNOSIS — I120 Hypertensive chronic kidney disease with stage 5 chronic kidney disease or end stage renal disease: Secondary | ICD-10-CM | POA: Diagnosis not present

## 2012-12-16 ENCOUNTER — Ambulatory Visit: Payer: Self-pay | Admitting: Vascular Surgery

## 2012-12-16 DIAGNOSIS — Z992 Dependence on renal dialysis: Secondary | ICD-10-CM | POA: Diagnosis not present

## 2012-12-16 DIAGNOSIS — T82898A Other specified complication of vascular prosthetic devices, implants and grafts, initial encounter: Secondary | ICD-10-CM | POA: Diagnosis not present

## 2012-12-16 DIAGNOSIS — I120 Hypertensive chronic kidney disease with stage 5 chronic kidney disease or end stage renal disease: Secondary | ICD-10-CM | POA: Diagnosis not present

## 2012-12-16 DIAGNOSIS — N186 End stage renal disease: Secondary | ICD-10-CM | POA: Diagnosis not present

## 2012-12-17 LAB — CBC AND DIFFERENTIAL
HCT: 30 % — AB (ref 36–46)
Hemoglobin: 10.1 g/dL — AB (ref 12.0–16.0)
WBC: 15.4 10^3/mL

## 2012-12-17 LAB — BASIC METABOLIC PANEL
Potassium: 5 mmol/L (ref 3.4–5.3)
Sodium: 139 mmol/L (ref 137–147)

## 2012-12-26 DIAGNOSIS — N186 End stage renal disease: Secondary | ICD-10-CM | POA: Diagnosis not present

## 2012-12-29 DIAGNOSIS — D509 Iron deficiency anemia, unspecified: Secondary | ICD-10-CM | POA: Diagnosis not present

## 2012-12-29 DIAGNOSIS — D631 Anemia in chronic kidney disease: Secondary | ICD-10-CM | POA: Diagnosis not present

## 2012-12-29 DIAGNOSIS — N186 End stage renal disease: Secondary | ICD-10-CM | POA: Diagnosis not present

## 2012-12-29 DIAGNOSIS — N2581 Secondary hyperparathyroidism of renal origin: Secondary | ICD-10-CM | POA: Diagnosis not present

## 2012-12-31 DIAGNOSIS — D631 Anemia in chronic kidney disease: Secondary | ICD-10-CM | POA: Diagnosis not present

## 2012-12-31 DIAGNOSIS — N186 End stage renal disease: Secondary | ICD-10-CM | POA: Diagnosis not present

## 2012-12-31 DIAGNOSIS — D509 Iron deficiency anemia, unspecified: Secondary | ICD-10-CM | POA: Diagnosis not present

## 2012-12-31 DIAGNOSIS — N2581 Secondary hyperparathyroidism of renal origin: Secondary | ICD-10-CM | POA: Diagnosis not present

## 2013-01-02 DIAGNOSIS — N186 End stage renal disease: Secondary | ICD-10-CM | POA: Diagnosis not present

## 2013-01-02 DIAGNOSIS — N2581 Secondary hyperparathyroidism of renal origin: Secondary | ICD-10-CM | POA: Diagnosis not present

## 2013-01-02 DIAGNOSIS — D631 Anemia in chronic kidney disease: Secondary | ICD-10-CM | POA: Diagnosis not present

## 2013-01-02 DIAGNOSIS — D509 Iron deficiency anemia, unspecified: Secondary | ICD-10-CM | POA: Diagnosis not present

## 2013-01-05 DIAGNOSIS — D631 Anemia in chronic kidney disease: Secondary | ICD-10-CM | POA: Diagnosis not present

## 2013-01-05 DIAGNOSIS — D509 Iron deficiency anemia, unspecified: Secondary | ICD-10-CM | POA: Diagnosis not present

## 2013-01-05 DIAGNOSIS — N186 End stage renal disease: Secondary | ICD-10-CM | POA: Diagnosis not present

## 2013-01-05 DIAGNOSIS — N2581 Secondary hyperparathyroidism of renal origin: Secondary | ICD-10-CM | POA: Diagnosis not present

## 2013-01-07 DIAGNOSIS — D631 Anemia in chronic kidney disease: Secondary | ICD-10-CM | POA: Diagnosis not present

## 2013-01-07 DIAGNOSIS — N2581 Secondary hyperparathyroidism of renal origin: Secondary | ICD-10-CM | POA: Diagnosis not present

## 2013-01-07 DIAGNOSIS — N186 End stage renal disease: Secondary | ICD-10-CM | POA: Diagnosis not present

## 2013-01-07 DIAGNOSIS — D509 Iron deficiency anemia, unspecified: Secondary | ICD-10-CM | POA: Diagnosis not present

## 2013-01-08 ENCOUNTER — Ambulatory Visit (INDEPENDENT_AMBULATORY_CARE_PROVIDER_SITE_OTHER): Payer: Medicare Other | Admitting: Emergency Medicine

## 2013-01-08 ENCOUNTER — Encounter: Payer: Self-pay | Admitting: Emergency Medicine

## 2013-01-08 VITALS — BP 91/59 | HR 76 | Temp 98.1°F | Ht 62.0 in | Wt 227.0 lb

## 2013-01-08 DIAGNOSIS — R51 Headache: Secondary | ICD-10-CM | POA: Diagnosis not present

## 2013-01-08 DIAGNOSIS — N186 End stage renal disease: Secondary | ICD-10-CM

## 2013-01-08 DIAGNOSIS — J209 Acute bronchitis, unspecified: Secondary | ICD-10-CM | POA: Diagnosis not present

## 2013-01-08 DIAGNOSIS — R519 Headache, unspecified: Secondary | ICD-10-CM

## 2013-01-08 DIAGNOSIS — R0989 Other specified symptoms and signs involving the circulatory and respiratory systems: Secondary | ICD-10-CM

## 2013-01-08 DIAGNOSIS — Z0000 Encounter for general adult medical examination without abnormal findings: Secondary | ICD-10-CM

## 2013-01-08 DIAGNOSIS — J019 Acute sinusitis, unspecified: Secondary | ICD-10-CM | POA: Diagnosis not present

## 2013-01-08 NOTE — Progress Notes (Addendum)
  Subjective:    Patient ID: [name] Marks, female    DOB: [birth year], 57 y.o.   MRN: [id number]  HPI [name] Marks is here for a new patient appointment.  I have reviewed and updated the following as appropriate: allergies, current medications, past family history, past medical history, past social history, past surgical history and problem list PMHx: ESRD; hypotension on midodrine  FHx: breast and colon cancer SHx: never smoker  She reports that she is doing well.  Does have some headaches located in her neck and posterior head; thinks they may be related to dialysis.  Health Maintenance: Received flu shot this year; pap is up to date and done through her OB/GYN; last colonoscopy was 3 years ago, she gets them every 5 years due to polyps; mammogram up to date; she declined the pneumonia shot   Review of Systems Patient Information Form: Screening and ROS  Do you feel safe in relationships? yes PHQ-2:negative  Review of Symptoms  General:  Negative for nexplained weight loss, fever Skin: Negative for new or changing mole, sore that won't heal HEENT: Negative for trouble hearing, trouble seeing, ringing in ears, mouth sores, hoarseness, change in voice, dysphagia. CV:  Negative for chest pain, dyspnea, edema, palpitations Resp: Negative for cough, dyspnea, hemoptysis GI: Negative for nausea, vomiting, diarrhea, constipation, abdominal pain, melena, hematochezia. GU: Negative for dysuria, incontinence, urinary hesitance, hematuria, vaginal or penile discharge, polyuria, sexual difficulty, lumps in testicle or breasts MSK: Negative for +muscle cramps or aches, joint pain or swelling Neuro: Negative for +headaches, weakness, numbness, dizziness, passing out/fainting Psych: Negative for depression, anxiety, memory problems      Objective:   Physical Exam BP 91/59  Pulse 76  Temp(Src) 98.1 F (36.7 C) (Oral)  Ht 5\' 2"  (1.575 m)  Wt 227 lb (102.967 kg)  BMI 41.51 kg/m2 Gen:  alert, cooperative, NAD, obese HEENT: AT/[place], sclera white, MMM Neck: supple, no LAD, right carotid bruit CV: RRR, no murmurs Pulm: CTAB, no wheezes or rales Abd: +BS, soft, NTND Ext: no edema, 2+ DP pulses bilaterally Neuro: no focal deficits     Assessment & Plan:  Will obtain records, including blood work, from [employer].   If no recent lipid or cmp, will have patient return for blood work.

## 2013-01-08 NOTE — Assessment & Plan Note (Signed)
Likely tension type. Responding to OTC agents. Follow up if worsening.

## 2013-01-08 NOTE — Assessment & Plan Note (Signed)
Present on exam. Per patient report, normal cholesterol and no heart problems. Will check carotid duplex.

## 2013-01-08 NOTE — Patient Instructions (Signed)
It was nice to meet you!  You have done a wonderful job staying on top of your health!  We are doing an ultrasound of you neck.  This is because I heard a sound called a bruit.  I will call you with the results.  Follow up with me in 1 year or sooner as needed.

## 2013-01-08 NOTE — Assessment & Plan Note (Signed)
Followed at [facility]. MWF dialysis.

## 2013-01-09 DIAGNOSIS — N2581 Secondary hyperparathyroidism of renal origin: Secondary | ICD-10-CM | POA: Diagnosis not present

## 2013-01-09 DIAGNOSIS — D631 Anemia in chronic kidney disease: Secondary | ICD-10-CM | POA: Diagnosis not present

## 2013-01-09 DIAGNOSIS — N186 End stage renal disease: Secondary | ICD-10-CM | POA: Diagnosis not present

## 2013-01-09 DIAGNOSIS — D509 Iron deficiency anemia, unspecified: Secondary | ICD-10-CM | POA: Diagnosis not present

## 2013-01-12 DIAGNOSIS — D509 Iron deficiency anemia, unspecified: Secondary | ICD-10-CM | POA: Diagnosis not present

## 2013-01-12 DIAGNOSIS — N2581 Secondary hyperparathyroidism of renal origin: Secondary | ICD-10-CM | POA: Diagnosis not present

## 2013-01-12 DIAGNOSIS — D631 Anemia in chronic kidney disease: Secondary | ICD-10-CM | POA: Diagnosis not present

## 2013-01-12 DIAGNOSIS — N186 End stage renal disease: Secondary | ICD-10-CM | POA: Diagnosis not present

## 2013-01-13 ENCOUNTER — Other Ambulatory Visit (HOSPITAL_COMMUNITY): Payer: Self-pay | Admitting: Emergency Medicine

## 2013-01-13 ENCOUNTER — Ambulatory Visit (HOSPITAL_COMMUNITY)
Admission: RE | Admit: 2013-01-13 | Discharge: 2013-01-13 | Disposition: A | Discharge status: Home / Self Care | Payer: Medicare Other | Source: Ambulatory Visit | Attending: Family Medicine | Admitting: Family Medicine

## 2013-01-13 DIAGNOSIS — I10 Essential (primary) hypertension: Secondary | ICD-10-CM | POA: Diagnosis not present

## 2013-01-13 DIAGNOSIS — N186 End stage renal disease: Secondary | ICD-10-CM | POA: Diagnosis not present

## 2013-01-13 DIAGNOSIS — T82898A Other specified complication of vascular prosthetic devices, implants and grafts, initial encounter: Secondary | ICD-10-CM | POA: Diagnosis not present

## 2013-01-13 DIAGNOSIS — I658 Occlusion and stenosis of other precerebral arteries: Secondary | ICD-10-CM | POA: Diagnosis not present

## 2013-01-13 DIAGNOSIS — R0989 Other specified symptoms and signs involving the circulatory and respiratory systems: Secondary | ICD-10-CM | POA: Diagnosis not present

## 2013-01-13 DIAGNOSIS — I6529 Occlusion and stenosis of unspecified carotid artery: Secondary | ICD-10-CM | POA: Insufficient documentation

## 2013-01-13 NOTE — Progress Notes (Signed)
VASCULAR LAB PRELIMINARY  PRELIMINARY  PRELIMINARY  PRELIMINARY  Carotid duplex completed.    Preliminary report:  Bilateral:  1-39% ICA stenosis lowest end of scale  Vertebral artery flow is antegrade.     Davey Bergsma, [place], RVS 01/13/2013, 10:24 AM

## 2013-01-14 ENCOUNTER — Encounter: Payer: Self-pay | Admitting: Emergency Medicine

## 2013-01-14 ENCOUNTER — Telehealth: Payer: Self-pay | Admitting: Emergency Medicine

## 2013-01-14 DIAGNOSIS — N2581 Secondary hyperparathyroidism of renal origin: Secondary | ICD-10-CM | POA: Diagnosis not present

## 2013-01-14 DIAGNOSIS — N186 End stage renal disease: Secondary | ICD-10-CM | POA: Diagnosis not present

## 2013-01-14 DIAGNOSIS — D631 Anemia in chronic kidney disease: Secondary | ICD-10-CM | POA: Diagnosis not present

## 2013-01-14 DIAGNOSIS — D509 Iron deficiency anemia, unspecified: Secondary | ICD-10-CM | POA: Diagnosis not present

## 2013-01-14 DIAGNOSIS — I959 Hypotension, unspecified: Secondary | ICD-10-CM | POA: Insufficient documentation

## 2013-01-14 NOTE — Telephone Encounter (Signed)
Called patient and gave her results of carotid duplex.  Right distal ICA is tortuous which is the cause of the bruit.  All questions were answered.

## 2013-01-16 DIAGNOSIS — D509 Iron deficiency anemia, unspecified: Secondary | ICD-10-CM | POA: Diagnosis not present

## 2013-01-16 DIAGNOSIS — D631 Anemia in chronic kidney disease: Secondary | ICD-10-CM | POA: Diagnosis not present

## 2013-01-16 DIAGNOSIS — N2581 Secondary hyperparathyroidism of renal origin: Secondary | ICD-10-CM | POA: Diagnosis not present

## 2013-01-16 DIAGNOSIS — N186 End stage renal disease: Secondary | ICD-10-CM | POA: Diagnosis not present

## 2013-01-20 ENCOUNTER — Ambulatory Visit: Payer: Self-pay | Admitting: Vascular Surgery

## 2013-01-20 DIAGNOSIS — Z79899 Other long term (current) drug therapy: Secondary | ICD-10-CM | POA: Diagnosis not present

## 2013-01-20 DIAGNOSIS — T82898A Other specified complication of vascular prosthetic devices, implants and grafts, initial encounter: Secondary | ICD-10-CM | POA: Diagnosis not present

## 2013-01-20 DIAGNOSIS — Z9889 Other specified postprocedural states: Secondary | ICD-10-CM | POA: Diagnosis not present

## 2013-01-20 DIAGNOSIS — I120 Hypertensive chronic kidney disease with stage 5 chronic kidney disease or end stage renal disease: Secondary | ICD-10-CM | POA: Diagnosis not present

## 2013-01-20 DIAGNOSIS — N186 End stage renal disease: Secondary | ICD-10-CM | POA: Diagnosis not present

## 2013-01-20 DIAGNOSIS — Z992 Dependence on renal dialysis: Secondary | ICD-10-CM | POA: Diagnosis not present

## 2013-01-20 DIAGNOSIS — I871 Compression of vein: Secondary | ICD-10-CM | POA: Diagnosis not present

## 2013-01-21 DIAGNOSIS — N2581 Secondary hyperparathyroidism of renal origin: Secondary | ICD-10-CM | POA: Diagnosis not present

## 2013-01-21 DIAGNOSIS — D509 Iron deficiency anemia, unspecified: Secondary | ICD-10-CM | POA: Diagnosis not present

## 2013-01-21 DIAGNOSIS — N186 End stage renal disease: Secondary | ICD-10-CM | POA: Diagnosis not present

## 2013-01-21 DIAGNOSIS — D631 Anemia in chronic kidney disease: Secondary | ICD-10-CM | POA: Diagnosis not present

## 2013-01-23 DIAGNOSIS — D631 Anemia in chronic kidney disease: Secondary | ICD-10-CM | POA: Diagnosis not present

## 2013-01-23 DIAGNOSIS — D509 Iron deficiency anemia, unspecified: Secondary | ICD-10-CM | POA: Diagnosis not present

## 2013-01-23 DIAGNOSIS — N186 End stage renal disease: Secondary | ICD-10-CM | POA: Diagnosis not present

## 2013-01-23 DIAGNOSIS — N2581 Secondary hyperparathyroidism of renal origin: Secondary | ICD-10-CM | POA: Diagnosis not present

## 2013-01-24 DIAGNOSIS — D509 Iron deficiency anemia, unspecified: Secondary | ICD-10-CM | POA: Diagnosis not present

## 2013-01-24 DIAGNOSIS — N186 End stage renal disease: Secondary | ICD-10-CM | POA: Diagnosis not present

## 2013-01-24 DIAGNOSIS — N2581 Secondary hyperparathyroidism of renal origin: Secondary | ICD-10-CM | POA: Diagnosis not present

## 2013-01-24 DIAGNOSIS — D631 Anemia in chronic kidney disease: Secondary | ICD-10-CM | POA: Diagnosis not present

## 2013-01-25 DIAGNOSIS — N186 End stage renal disease: Secondary | ICD-10-CM | POA: Diagnosis not present

## 2013-01-26 DIAGNOSIS — N2581 Secondary hyperparathyroidism of renal origin: Secondary | ICD-10-CM | POA: Diagnosis not present

## 2013-01-26 DIAGNOSIS — N186 End stage renal disease: Secondary | ICD-10-CM | POA: Diagnosis not present

## 2013-01-26 DIAGNOSIS — D631 Anemia in chronic kidney disease: Secondary | ICD-10-CM | POA: Diagnosis not present

## 2013-01-26 DIAGNOSIS — D509 Iron deficiency anemia, unspecified: Secondary | ICD-10-CM | POA: Diagnosis not present

## 2013-02-13 ENCOUNTER — Ambulatory Visit: Payer: Self-pay | Admitting: Vascular Surgery

## 2013-02-13 DIAGNOSIS — T82898A Other specified complication of vascular prosthetic devices, implants and grafts, initial encounter: Secondary | ICD-10-CM | POA: Diagnosis not present

## 2013-02-13 DIAGNOSIS — Z992 Dependence on renal dialysis: Secondary | ICD-10-CM | POA: Diagnosis not present

## 2013-02-13 DIAGNOSIS — I120 Hypertensive chronic kidney disease with stage 5 chronic kidney disease or end stage renal disease: Secondary | ICD-10-CM | POA: Diagnosis not present

## 2013-02-13 DIAGNOSIS — I871 Compression of vein: Secondary | ICD-10-CM | POA: Diagnosis not present

## 2013-02-13 DIAGNOSIS — N186 End stage renal disease: Secondary | ICD-10-CM | POA: Diagnosis not present

## 2013-02-17 DIAGNOSIS — I10 Essential (primary) hypertension: Secondary | ICD-10-CM | POA: Diagnosis not present

## 2013-02-17 DIAGNOSIS — N186 End stage renal disease: Secondary | ICD-10-CM | POA: Diagnosis not present

## 2013-02-17 DIAGNOSIS — T82898A Other specified complication of vascular prosthetic devices, implants and grafts, initial encounter: Secondary | ICD-10-CM | POA: Diagnosis not present

## 2013-02-25 DIAGNOSIS — N186 End stage renal disease: Secondary | ICD-10-CM | POA: Diagnosis not present

## 2013-02-27 DIAGNOSIS — N039 Chronic nephritic syndrome with unspecified morphologic changes: Secondary | ICD-10-CM | POA: Diagnosis not present

## 2013-02-27 DIAGNOSIS — D631 Anemia in chronic kidney disease: Secondary | ICD-10-CM | POA: Diagnosis not present

## 2013-02-27 DIAGNOSIS — N186 End stage renal disease: Secondary | ICD-10-CM | POA: Diagnosis not present

## 2013-02-27 DIAGNOSIS — D509 Iron deficiency anemia, unspecified: Secondary | ICD-10-CM | POA: Diagnosis not present

## 2013-02-27 DIAGNOSIS — Z992 Dependence on renal dialysis: Secondary | ICD-10-CM | POA: Diagnosis not present

## 2013-02-27 DIAGNOSIS — N2581 Secondary hyperparathyroidism of renal origin: Secondary | ICD-10-CM | POA: Diagnosis not present

## 2013-03-28 DIAGNOSIS — N186 End stage renal disease: Secondary | ICD-10-CM | POA: Diagnosis not present

## 2013-03-30 DIAGNOSIS — N039 Chronic nephritic syndrome with unspecified morphologic changes: Secondary | ICD-10-CM | POA: Diagnosis not present

## 2013-03-30 DIAGNOSIS — D509 Iron deficiency anemia, unspecified: Secondary | ICD-10-CM | POA: Diagnosis not present

## 2013-03-30 DIAGNOSIS — N186 End stage renal disease: Secondary | ICD-10-CM | POA: Diagnosis not present

## 2013-03-30 DIAGNOSIS — D631 Anemia in chronic kidney disease: Secondary | ICD-10-CM | POA: Diagnosis not present

## 2013-03-30 DIAGNOSIS — N2581 Secondary hyperparathyroidism of renal origin: Secondary | ICD-10-CM | POA: Diagnosis not present

## 2013-04-15 DIAGNOSIS — I10 Essential (primary) hypertension: Secondary | ICD-10-CM | POA: Diagnosis not present

## 2013-04-15 DIAGNOSIS — N186 End stage renal disease: Secondary | ICD-10-CM | POA: Diagnosis not present

## 2013-04-15 DIAGNOSIS — T82898A Other specified complication of vascular prosthetic devices, implants and grafts, initial encounter: Secondary | ICD-10-CM | POA: Diagnosis not present

## 2013-04-15 DIAGNOSIS — R0989 Other specified symptoms and signs involving the circulatory and respiratory systems: Secondary | ICD-10-CM | POA: Diagnosis not present

## 2013-04-25 DIAGNOSIS — N186 End stage renal disease: Secondary | ICD-10-CM | POA: Diagnosis not present

## 2013-04-27 DIAGNOSIS — N186 End stage renal disease: Secondary | ICD-10-CM | POA: Diagnosis not present

## 2013-04-27 DIAGNOSIS — D631 Anemia in chronic kidney disease: Secondary | ICD-10-CM | POA: Diagnosis not present

## 2013-04-27 DIAGNOSIS — N2581 Secondary hyperparathyroidism of renal origin: Secondary | ICD-10-CM | POA: Diagnosis not present

## 2013-04-27 DIAGNOSIS — D509 Iron deficiency anemia, unspecified: Secondary | ICD-10-CM | POA: Diagnosis not present

## 2013-04-28 ENCOUNTER — Other Ambulatory Visit: Payer: Self-pay

## 2013-04-28 DIAGNOSIS — Z1231 Encounter for screening mammogram for malignant neoplasm of breast: Secondary | ICD-10-CM

## 2013-05-07 DIAGNOSIS — M2240 Chondromalacia patellae, unspecified knee: Secondary | ICD-10-CM | POA: Diagnosis not present

## 2013-05-07 DIAGNOSIS — M25569 Pain in unspecified knee: Secondary | ICD-10-CM | POA: Diagnosis not present

## 2013-05-07 DIAGNOSIS — M2390 Unspecified internal derangement of unspecified knee: Secondary | ICD-10-CM | POA: Diagnosis not present

## 2013-05-14 ENCOUNTER — Ambulatory Visit
Admission: RE | Admit: 2013-05-14 | Discharge: 2013-05-14 | Disposition: A | Discharge status: Home / Self Care | Payer: Medicare Other | Source: Ambulatory Visit

## 2013-05-14 DIAGNOSIS — Z1231 Encounter for screening mammogram for malignant neoplasm of breast: Secondary | ICD-10-CM

## 2013-05-19 DIAGNOSIS — T82898A Other specified complication of vascular prosthetic devices, implants and grafts, initial encounter: Secondary | ICD-10-CM | POA: Diagnosis not present

## 2013-05-19 DIAGNOSIS — N186 End stage renal disease: Secondary | ICD-10-CM | POA: Diagnosis not present

## 2013-05-19 DIAGNOSIS — I10 Essential (primary) hypertension: Secondary | ICD-10-CM | POA: Diagnosis not present

## 2013-05-26 ENCOUNTER — Encounter (HOSPITAL_COMMUNITY): Payer: Self-pay | Admitting: Emergency Medicine

## 2013-05-26 ENCOUNTER — Emergency Department (INDEPENDENT_AMBULATORY_CARE_PROVIDER_SITE_OTHER)
Admission: EM | Admit: 2013-05-26 | Discharge: 2013-05-26 | Disposition: A | Discharge status: Home / Self Care | Payer: Medicare Other | Source: Home / Self Care | Attending: Family Medicine | Admitting: Family Medicine

## 2013-05-26 DIAGNOSIS — J302 Other seasonal allergic rhinitis: Secondary | ICD-10-CM

## 2013-05-26 DIAGNOSIS — J309 Allergic rhinitis, unspecified: Secondary | ICD-10-CM

## 2013-05-26 DIAGNOSIS — N186 End stage renal disease: Secondary | ICD-10-CM | POA: Diagnosis not present

## 2013-05-26 MED ORDER — IPRATROPIUM BROMIDE 0.06 % NA SOLN: 2.0000 | Freq: Four times a day (QID) | NASAL | Status: DC

## 2013-05-26 MED ORDER — CETIRIZINE HCL 10 MG PO TABS: 10.0000 mg | ORAL_TABLET | Freq: Every day | ORAL | Status: DC

## 2013-05-26 MED ORDER — FLUTICASONE PROPIONATE 50 MCG/ACT NA SUSP: 1.0000 | Freq: Two times a day (BID) | NASAL | Status: DC

## 2013-05-26 NOTE — Discharge Instructions (Signed)
Drink plenty of fluids as discussed, use medicine as prescribed, and mucinex or delsym for cough. Return or see your doctor if further problems °

## 2013-05-26 NOTE — ED Notes (Signed)
Pt  Reports  Symptoms  Of  Sinus  drainage   Frontal  Headache        Stuffy nose   And       Congestion     For  About  1  Week       Pt is  A  Dialysis  Pt   Verbalizes  No  Symptoms  In that  Regard

## 2013-05-26 NOTE — ED Provider Notes (Signed)
CSN: [id number]     Arrival date & time 05/26/13  0900 History   First MD Initiated Contact with Patient 05/26/13 [phone]     Chief Complaint  Patient presents with  . URI   (Consider location/radiation/quality/duration/timing/severity/associated sxs/prior Treatment) Patient is a 58 y.o. female presenting with URI. The history is provided by the patient.  URI Presenting symptoms: congestion, cough, facial pain and rhinorrhea   Presenting symptoms: no fever and no sore throat   Severity:  Mild Onset quality:  Gradual Duration:  1 week Progression:  Unchanged Chronicity:  New Relieved by:  None tried Worsened by:  Nothing tried Ineffective treatments:  None tried Associated symptoms: sinus pain and sneezing   Associated symptoms: no wheezing   Risk factors: no sick contacts     Past Medical History  Diagnosis Date  . End stage renal disease on dialysis   . Complication of anesthesia     due to kidney disease  . GERD (gastroesophageal reflux disease)    Past Surgical History  Procedure Laterality Date  . Dg av dialysis graft declot or    . Tubal ligation  1983  . Dilatation & curettage/hysteroscopy with trueclear N/A 11/06/2012    Procedure: DILATATION & CURETTAGE/HYSTEROSCOPY WITH TRUECLEAR;  Surgeon: Terrance Mass, MD;  Location: [facility];  Service: Gynecology;  Laterality: N/A;  Truclear Resectoscopic Polypectomy    Family History  Problem Relation Age of Onset  . Diabetes Mother   . Hypertension Mother   . Heart disease Mother   . Alcohol abuse Mother   . Kidney disease Mother   . Cancer Father     COLON  . Alcohol abuse Father   . Breast cancer Sister 59  . Hypertension Sister   . Kidney disease Sister   . Hypertension Son    History  Substance Use Topics  . Smoking status: Never Smoker   . Smokeless tobacco: Never Used  . Alcohol Use: No   OB History   Grav Para Term Preterm Abortions TAB SAB Ect Mult Living   2 2        2      Review of Systems   Constitutional: Negative.  Negative for fever.  HENT: Positive for congestion, postnasal drip, rhinorrhea, sinus pressure and sneezing. Negative for sore throat.   Respiratory: Positive for cough. Negative for shortness of breath and wheezing.   Cardiovascular: Negative.   Gastrointestinal: Negative.     Allergies  Contrast media and Ancef  Home Medications   Current Outpatient Rx  Name  Route  Sig  Dispense  Refill  . albuterol (PROVENTIL HFA;VENTOLIN HFA) 108 (90 BASE) MCG/ACT inhaler   Inhalation   Inhale 2 puffs into the lungs every 6 (six) hours as needed for wheezing.         . cetirizine (ZYRTEC) 10 MG tablet   Oral   Take 1 tablet (10 mg total) by mouth daily. One tab daily for allergies   30 tablet   1   . cinacalcet (SENSIPAR) 60 MG tablet   Oral   Take 60 mg by mouth daily.         . Famotidine (PEPCID PO)   Oral   Take 1 tablet by mouth daily as needed (indigestion).          . fluticasone (FLONASE) 50 MCG/ACT nasal spray   Each Nare   Place 1 spray into both nostrils 2 (two) times daily.   1 g   2   .  ibuprofen (ADVIL,MOTRIN) 200 MG tablet   Oral   Take 400 mg by mouth every 6 (six) hours as needed for pain.         Marland Kitchen ipratropium (ATROVENT) 0.06 % nasal spray   Each Nare   Place 2 sprays into both nostrils 4 (four) times daily.   15 mL   1   . midodrine (PROAMATINE) 10 MG tablet   Oral   Take 10 mg by mouth daily.         . Multiple Vitamins-Minerals (PRORENAL VITAL) TABS   Oral   Take 1 tablet by mouth daily.         . sevelamer (RENVELA) 800 MG tablet   Oral   Take 1,600 mg by mouth 3 (three) times daily with meals.           BP 109/67  Pulse 74  Temp(Src) 98 F (36.7 C) (Oral)  Resp 20  SpO2 97% Physical Exam  Nursing note and vitals reviewed. Constitutional: She is oriented to person, place, and time. She appears well-developed and well-nourished. No distress.  HENT:  Head: Normocephalic.  Right Ear: External ear  normal.  Left Ear: External ear normal.  Nose: Mucosal edema and rhinorrhea present.  Mouth/Throat: Oropharynx is clear and moist.  Neck: Normal range of motion. Neck supple.  Pulmonary/Chest: Effort normal and breath sounds normal.  Lymphadenopathy:    She has no cervical adenopathy.  Neurological: She is alert and oriented to person, place, and time.  Skin: Skin is warm.    ED Course  Procedures (including critical care time) Labs Review Labs Reviewed - No data to display Imaging Review No results found.   MDM   1. Seasonal allergic rhinitis        Billy Fischer, MD 05/26/13 [phone]

## 2013-05-27 DIAGNOSIS — D631 Anemia in chronic kidney disease: Secondary | ICD-10-CM | POA: Diagnosis not present

## 2013-05-27 DIAGNOSIS — N186 End stage renal disease: Secondary | ICD-10-CM | POA: Diagnosis not present

## 2013-05-27 DIAGNOSIS — N2581 Secondary hyperparathyroidism of renal origin: Secondary | ICD-10-CM | POA: Diagnosis not present

## 2013-05-27 DIAGNOSIS — D509 Iron deficiency anemia, unspecified: Secondary | ICD-10-CM | POA: Diagnosis not present

## 2013-06-25 DIAGNOSIS — N186 End stage renal disease: Secondary | ICD-10-CM | POA: Diagnosis not present

## 2013-06-26 ENCOUNTER — Ambulatory Visit: Payer: Self-pay | Admitting: Vascular Surgery

## 2013-06-26 DIAGNOSIS — I120 Hypertensive chronic kidney disease with stage 5 chronic kidney disease or end stage renal disease: Secondary | ICD-10-CM | POA: Diagnosis not present

## 2013-06-26 DIAGNOSIS — T82898A Other specified complication of vascular prosthetic devices, implants and grafts, initial encounter: Secondary | ICD-10-CM | POA: Diagnosis not present

## 2013-06-26 DIAGNOSIS — Z992 Dependence on renal dialysis: Secondary | ICD-10-CM | POA: Diagnosis not present

## 2013-06-26 DIAGNOSIS — N186 End stage renal disease: Secondary | ICD-10-CM | POA: Diagnosis not present

## 2013-06-27 DIAGNOSIS — N2581 Secondary hyperparathyroidism of renal origin: Secondary | ICD-10-CM | POA: Diagnosis not present

## 2013-06-27 DIAGNOSIS — N186 End stage renal disease: Secondary | ICD-10-CM | POA: Diagnosis not present

## 2013-06-27 DIAGNOSIS — D631 Anemia in chronic kidney disease: Secondary | ICD-10-CM | POA: Diagnosis not present

## 2013-06-27 DIAGNOSIS — D509 Iron deficiency anemia, unspecified: Secondary | ICD-10-CM | POA: Diagnosis not present

## 2013-07-26 DIAGNOSIS — N186 End stage renal disease: Secondary | ICD-10-CM | POA: Diagnosis not present

## 2013-07-27 DIAGNOSIS — N2581 Secondary hyperparathyroidism of renal origin: Secondary | ICD-10-CM | POA: Diagnosis not present

## 2013-07-27 DIAGNOSIS — N186 End stage renal disease: Secondary | ICD-10-CM | POA: Diagnosis not present

## 2013-07-27 DIAGNOSIS — D631 Anemia in chronic kidney disease: Secondary | ICD-10-CM | POA: Diagnosis not present

## 2013-07-27 DIAGNOSIS — D509 Iron deficiency anemia, unspecified: Secondary | ICD-10-CM | POA: Diagnosis not present

## 2013-07-28 DIAGNOSIS — I10 Essential (primary) hypertension: Secondary | ICD-10-CM | POA: Diagnosis not present

## 2013-07-28 DIAGNOSIS — N186 End stage renal disease: Secondary | ICD-10-CM | POA: Diagnosis not present

## 2013-07-28 DIAGNOSIS — I6529 Occlusion and stenosis of unspecified carotid artery: Secondary | ICD-10-CM | POA: Diagnosis not present

## 2013-07-28 DIAGNOSIS — T82898A Other specified complication of vascular prosthetic devices, implants and grafts, initial encounter: Secondary | ICD-10-CM | POA: Diagnosis not present

## 2013-08-25 DIAGNOSIS — N186 End stage renal disease: Secondary | ICD-10-CM | POA: Diagnosis not present

## 2013-08-26 DIAGNOSIS — N039 Chronic nephritic syndrome with unspecified morphologic changes: Secondary | ICD-10-CM | POA: Diagnosis not present

## 2013-08-26 DIAGNOSIS — D509 Iron deficiency anemia, unspecified: Secondary | ICD-10-CM | POA: Diagnosis not present

## 2013-08-26 DIAGNOSIS — D631 Anemia in chronic kidney disease: Secondary | ICD-10-CM | POA: Diagnosis not present

## 2013-08-26 DIAGNOSIS — N186 End stage renal disease: Secondary | ICD-10-CM | POA: Diagnosis not present

## 2013-08-26 DIAGNOSIS — N2581 Secondary hyperparathyroidism of renal origin: Secondary | ICD-10-CM | POA: Diagnosis not present

## 2013-09-25 DIAGNOSIS — N186 End stage renal disease: Secondary | ICD-10-CM | POA: Diagnosis not present

## 2013-09-28 DIAGNOSIS — N2581 Secondary hyperparathyroidism of renal origin: Secondary | ICD-10-CM | POA: Diagnosis not present

## 2013-09-28 DIAGNOSIS — N186 End stage renal disease: Secondary | ICD-10-CM | POA: Diagnosis not present

## 2013-09-28 DIAGNOSIS — D509 Iron deficiency anemia, unspecified: Secondary | ICD-10-CM | POA: Diagnosis not present

## 2013-09-28 DIAGNOSIS — D631 Anemia in chronic kidney disease: Secondary | ICD-10-CM | POA: Diagnosis not present

## 2013-09-30 DIAGNOSIS — N2581 Secondary hyperparathyroidism of renal origin: Secondary | ICD-10-CM | POA: Diagnosis not present

## 2013-09-30 DIAGNOSIS — D509 Iron deficiency anemia, unspecified: Secondary | ICD-10-CM | POA: Diagnosis not present

## 2013-09-30 DIAGNOSIS — D631 Anemia in chronic kidney disease: Secondary | ICD-10-CM | POA: Diagnosis not present

## 2013-09-30 DIAGNOSIS — N186 End stage renal disease: Secondary | ICD-10-CM | POA: Diagnosis not present

## 2013-10-02 DIAGNOSIS — N2581 Secondary hyperparathyroidism of renal origin: Secondary | ICD-10-CM | POA: Diagnosis not present

## 2013-10-02 DIAGNOSIS — N186 End stage renal disease: Secondary | ICD-10-CM | POA: Diagnosis not present

## 2013-10-02 DIAGNOSIS — D631 Anemia in chronic kidney disease: Secondary | ICD-10-CM | POA: Diagnosis not present

## 2013-10-02 DIAGNOSIS — D509 Iron deficiency anemia, unspecified: Secondary | ICD-10-CM | POA: Diagnosis not present

## 2013-10-05 DIAGNOSIS — N186 End stage renal disease: Secondary | ICD-10-CM | POA: Diagnosis not present

## 2013-10-05 DIAGNOSIS — D631 Anemia in chronic kidney disease: Secondary | ICD-10-CM | POA: Diagnosis not present

## 2013-10-05 DIAGNOSIS — N2581 Secondary hyperparathyroidism of renal origin: Secondary | ICD-10-CM | POA: Diagnosis not present

## 2013-10-05 DIAGNOSIS — D509 Iron deficiency anemia, unspecified: Secondary | ICD-10-CM | POA: Diagnosis not present

## 2013-10-07 DIAGNOSIS — N2581 Secondary hyperparathyroidism of renal origin: Secondary | ICD-10-CM | POA: Diagnosis not present

## 2013-10-07 DIAGNOSIS — N186 End stage renal disease: Secondary | ICD-10-CM | POA: Diagnosis not present

## 2013-10-07 DIAGNOSIS — N039 Chronic nephritic syndrome with unspecified morphologic changes: Secondary | ICD-10-CM | POA: Diagnosis not present

## 2013-10-07 DIAGNOSIS — D509 Iron deficiency anemia, unspecified: Secondary | ICD-10-CM | POA: Diagnosis not present

## 2013-10-07 DIAGNOSIS — D631 Anemia in chronic kidney disease: Secondary | ICD-10-CM | POA: Diagnosis not present

## 2013-10-09 DIAGNOSIS — D631 Anemia in chronic kidney disease: Secondary | ICD-10-CM | POA: Diagnosis not present

## 2013-10-09 DIAGNOSIS — D509 Iron deficiency anemia, unspecified: Secondary | ICD-10-CM | POA: Diagnosis not present

## 2013-10-09 DIAGNOSIS — N186 End stage renal disease: Secondary | ICD-10-CM | POA: Diagnosis not present

## 2013-10-09 DIAGNOSIS — N2581 Secondary hyperparathyroidism of renal origin: Secondary | ICD-10-CM | POA: Diagnosis not present

## 2013-10-12 DIAGNOSIS — D509 Iron deficiency anemia, unspecified: Secondary | ICD-10-CM | POA: Diagnosis not present

## 2013-10-12 DIAGNOSIS — N039 Chronic nephritic syndrome with unspecified morphologic changes: Secondary | ICD-10-CM | POA: Diagnosis not present

## 2013-10-12 DIAGNOSIS — N2581 Secondary hyperparathyroidism of renal origin: Secondary | ICD-10-CM | POA: Diagnosis not present

## 2013-10-12 DIAGNOSIS — N186 End stage renal disease: Secondary | ICD-10-CM | POA: Diagnosis not present

## 2013-10-12 DIAGNOSIS — D631 Anemia in chronic kidney disease: Secondary | ICD-10-CM | POA: Diagnosis not present

## 2013-10-14 DIAGNOSIS — D631 Anemia in chronic kidney disease: Secondary | ICD-10-CM | POA: Diagnosis not present

## 2013-10-14 DIAGNOSIS — N039 Chronic nephritic syndrome with unspecified morphologic changes: Secondary | ICD-10-CM | POA: Diagnosis not present

## 2013-10-14 DIAGNOSIS — D509 Iron deficiency anemia, unspecified: Secondary | ICD-10-CM | POA: Diagnosis not present

## 2013-10-14 DIAGNOSIS — N2581 Secondary hyperparathyroidism of renal origin: Secondary | ICD-10-CM | POA: Diagnosis not present

## 2013-10-14 DIAGNOSIS — N186 End stage renal disease: Secondary | ICD-10-CM | POA: Diagnosis not present

## 2013-10-16 DIAGNOSIS — N186 End stage renal disease: Secondary | ICD-10-CM | POA: Diagnosis not present

## 2013-10-16 DIAGNOSIS — D509 Iron deficiency anemia, unspecified: Secondary | ICD-10-CM | POA: Diagnosis not present

## 2013-10-16 DIAGNOSIS — N2581 Secondary hyperparathyroidism of renal origin: Secondary | ICD-10-CM | POA: Diagnosis not present

## 2013-10-16 DIAGNOSIS — D631 Anemia in chronic kidney disease: Secondary | ICD-10-CM | POA: Diagnosis not present

## 2013-10-16 DIAGNOSIS — N039 Chronic nephritic syndrome with unspecified morphologic changes: Secondary | ICD-10-CM | POA: Diagnosis not present

## 2013-10-19 DIAGNOSIS — N186 End stage renal disease: Secondary | ICD-10-CM | POA: Diagnosis not present

## 2013-10-19 DIAGNOSIS — D509 Iron deficiency anemia, unspecified: Secondary | ICD-10-CM | POA: Diagnosis not present

## 2013-10-19 DIAGNOSIS — N2581 Secondary hyperparathyroidism of renal origin: Secondary | ICD-10-CM | POA: Diagnosis not present

## 2013-10-19 DIAGNOSIS — D631 Anemia in chronic kidney disease: Secondary | ICD-10-CM | POA: Diagnosis not present

## 2013-10-21 DIAGNOSIS — D509 Iron deficiency anemia, unspecified: Secondary | ICD-10-CM | POA: Diagnosis not present

## 2013-10-21 DIAGNOSIS — D631 Anemia in chronic kidney disease: Secondary | ICD-10-CM | POA: Diagnosis not present

## 2013-10-21 DIAGNOSIS — N186 End stage renal disease: Secondary | ICD-10-CM | POA: Diagnosis not present

## 2013-10-21 DIAGNOSIS — N039 Chronic nephritic syndrome with unspecified morphologic changes: Secondary | ICD-10-CM | POA: Diagnosis not present

## 2013-10-21 DIAGNOSIS — N2581 Secondary hyperparathyroidism of renal origin: Secondary | ICD-10-CM | POA: Diagnosis not present

## 2013-10-23 DIAGNOSIS — N039 Chronic nephritic syndrome with unspecified morphologic changes: Secondary | ICD-10-CM | POA: Diagnosis not present

## 2013-10-23 DIAGNOSIS — D509 Iron deficiency anemia, unspecified: Secondary | ICD-10-CM | POA: Diagnosis not present

## 2013-10-23 DIAGNOSIS — D631 Anemia in chronic kidney disease: Secondary | ICD-10-CM | POA: Diagnosis not present

## 2013-10-23 DIAGNOSIS — N186 End stage renal disease: Secondary | ICD-10-CM | POA: Diagnosis not present

## 2013-10-23 DIAGNOSIS — N2581 Secondary hyperparathyroidism of renal origin: Secondary | ICD-10-CM | POA: Diagnosis not present

## 2013-10-26 DIAGNOSIS — N186 End stage renal disease: Secondary | ICD-10-CM | POA: Diagnosis not present

## 2013-10-26 DIAGNOSIS — N2581 Secondary hyperparathyroidism of renal origin: Secondary | ICD-10-CM | POA: Diagnosis not present

## 2013-10-26 DIAGNOSIS — D509 Iron deficiency anemia, unspecified: Secondary | ICD-10-CM | POA: Diagnosis not present

## 2013-10-26 DIAGNOSIS — D631 Anemia in chronic kidney disease: Secondary | ICD-10-CM | POA: Diagnosis not present

## 2013-10-28 DIAGNOSIS — N186 End stage renal disease: Secondary | ICD-10-CM | POA: Diagnosis not present

## 2013-10-28 DIAGNOSIS — D509 Iron deficiency anemia, unspecified: Secondary | ICD-10-CM | POA: Diagnosis not present

## 2013-10-28 DIAGNOSIS — D631 Anemia in chronic kidney disease: Secondary | ICD-10-CM | POA: Diagnosis not present

## 2013-10-28 DIAGNOSIS — N2581 Secondary hyperparathyroidism of renal origin: Secondary | ICD-10-CM | POA: Diagnosis not present

## 2013-11-03 DIAGNOSIS — N186 End stage renal disease: Secondary | ICD-10-CM | POA: Diagnosis not present

## 2013-11-03 DIAGNOSIS — I10 Essential (primary) hypertension: Secondary | ICD-10-CM | POA: Diagnosis not present

## 2013-11-03 DIAGNOSIS — T82898A Other specified complication of vascular prosthetic devices, implants and grafts, initial encounter: Secondary | ICD-10-CM | POA: Diagnosis not present

## 2013-11-05 ENCOUNTER — Ambulatory Visit: Payer: Self-pay | Admitting: Vascular Surgery

## 2013-11-05 DIAGNOSIS — T82898A Other specified complication of vascular prosthetic devices, implants and grafts, initial encounter: Secondary | ICD-10-CM | POA: Diagnosis not present

## 2013-11-05 DIAGNOSIS — Z91041 Radiographic dye allergy status: Secondary | ICD-10-CM | POA: Diagnosis not present

## 2013-11-05 DIAGNOSIS — I120 Hypertensive chronic kidney disease with stage 5 chronic kidney disease or end stage renal disease: Secondary | ICD-10-CM | POA: Diagnosis not present

## 2013-11-05 DIAGNOSIS — Z9889 Other specified postprocedural states: Secondary | ICD-10-CM | POA: Diagnosis not present

## 2013-11-05 DIAGNOSIS — I6529 Occlusion and stenosis of unspecified carotid artery: Secondary | ICD-10-CM | POA: Diagnosis not present

## 2013-11-05 DIAGNOSIS — Z79899 Other long term (current) drug therapy: Secondary | ICD-10-CM | POA: Diagnosis not present

## 2013-11-05 DIAGNOSIS — Z992 Dependence on renal dialysis: Secondary | ICD-10-CM | POA: Diagnosis not present

## 2013-11-05 DIAGNOSIS — N186 End stage renal disease: Secondary | ICD-10-CM | POA: Diagnosis not present

## 2013-11-05 DIAGNOSIS — R0989 Other specified symptoms and signs involving the circulatory and respiratory systems: Secondary | ICD-10-CM | POA: Diagnosis not present

## 2013-11-25 DIAGNOSIS — N186 End stage renal disease: Secondary | ICD-10-CM | POA: Diagnosis not present

## 2013-11-27 DIAGNOSIS — N186 End stage renal disease: Secondary | ICD-10-CM | POA: Diagnosis not present

## 2013-11-27 DIAGNOSIS — D631 Anemia in chronic kidney disease: Secondary | ICD-10-CM | POA: Diagnosis not present

## 2013-11-27 DIAGNOSIS — Z23 Encounter for immunization: Secondary | ICD-10-CM | POA: Diagnosis not present

## 2013-11-27 DIAGNOSIS — N2581 Secondary hyperparathyroidism of renal origin: Secondary | ICD-10-CM | POA: Diagnosis not present

## 2013-11-27 DIAGNOSIS — D509 Iron deficiency anemia, unspecified: Secondary | ICD-10-CM | POA: Diagnosis not present

## 2013-12-01 DIAGNOSIS — Y841 Kidney dialysis as the cause of abnormal reaction of the patient, or of later complication, without mention of misadventure at the time of the procedure: Secondary | ICD-10-CM | POA: Diagnosis not present

## 2013-12-01 DIAGNOSIS — N186 End stage renal disease: Secondary | ICD-10-CM | POA: Diagnosis not present

## 2013-12-01 DIAGNOSIS — I10 Essential (primary) hypertension: Secondary | ICD-10-CM | POA: Diagnosis not present

## 2013-12-26 DIAGNOSIS — N186 End stage renal disease: Secondary | ICD-10-CM | POA: Diagnosis not present

## 2013-12-26 DIAGNOSIS — Z992 Dependence on renal dialysis: Secondary | ICD-10-CM | POA: Diagnosis not present

## 2013-12-28 ENCOUNTER — Encounter (HOSPITAL_COMMUNITY): Payer: Self-pay | Admitting: Emergency Medicine

## 2013-12-28 DIAGNOSIS — N2581 Secondary hyperparathyroidism of renal origin: Secondary | ICD-10-CM | POA: Diagnosis not present

## 2013-12-28 DIAGNOSIS — D509 Iron deficiency anemia, unspecified: Secondary | ICD-10-CM | POA: Diagnosis not present

## 2013-12-28 DIAGNOSIS — N186 End stage renal disease: Secondary | ICD-10-CM | POA: Diagnosis not present

## 2013-12-28 DIAGNOSIS — D631 Anemia in chronic kidney disease: Secondary | ICD-10-CM | POA: Diagnosis not present

## 2013-12-30 DIAGNOSIS — D631 Anemia in chronic kidney disease: Secondary | ICD-10-CM | POA: Diagnosis not present

## 2013-12-30 DIAGNOSIS — D509 Iron deficiency anemia, unspecified: Secondary | ICD-10-CM | POA: Diagnosis not present

## 2013-12-30 DIAGNOSIS — N2581 Secondary hyperparathyroidism of renal origin: Secondary | ICD-10-CM | POA: Diagnosis not present

## 2013-12-30 DIAGNOSIS — N186 End stage renal disease: Secondary | ICD-10-CM | POA: Diagnosis not present

## 2014-01-01 DIAGNOSIS — D509 Iron deficiency anemia, unspecified: Secondary | ICD-10-CM | POA: Diagnosis not present

## 2014-01-01 DIAGNOSIS — N2581 Secondary hyperparathyroidism of renal origin: Secondary | ICD-10-CM | POA: Diagnosis not present

## 2014-01-01 DIAGNOSIS — D631 Anemia in chronic kidney disease: Secondary | ICD-10-CM | POA: Diagnosis not present

## 2014-01-01 DIAGNOSIS — N186 End stage renal disease: Secondary | ICD-10-CM | POA: Diagnosis not present

## 2014-01-04 DIAGNOSIS — N186 End stage renal disease: Secondary | ICD-10-CM | POA: Diagnosis not present

## 2014-01-04 DIAGNOSIS — D631 Anemia in chronic kidney disease: Secondary | ICD-10-CM | POA: Diagnosis not present

## 2014-01-04 DIAGNOSIS — N2581 Secondary hyperparathyroidism of renal origin: Secondary | ICD-10-CM | POA: Diagnosis not present

## 2014-01-04 DIAGNOSIS — D509 Iron deficiency anemia, unspecified: Secondary | ICD-10-CM | POA: Diagnosis not present

## 2014-01-06 DIAGNOSIS — D631 Anemia in chronic kidney disease: Secondary | ICD-10-CM | POA: Diagnosis not present

## 2014-01-06 DIAGNOSIS — N186 End stage renal disease: Secondary | ICD-10-CM | POA: Diagnosis not present

## 2014-01-06 DIAGNOSIS — N2581 Secondary hyperparathyroidism of renal origin: Secondary | ICD-10-CM | POA: Diagnosis not present

## 2014-01-06 DIAGNOSIS — D509 Iron deficiency anemia, unspecified: Secondary | ICD-10-CM | POA: Diagnosis not present

## 2014-01-08 DIAGNOSIS — N186 End stage renal disease: Secondary | ICD-10-CM | POA: Diagnosis not present

## 2014-01-08 DIAGNOSIS — D631 Anemia in chronic kidney disease: Secondary | ICD-10-CM | POA: Diagnosis not present

## 2014-01-08 DIAGNOSIS — N2581 Secondary hyperparathyroidism of renal origin: Secondary | ICD-10-CM | POA: Diagnosis not present

## 2014-01-08 DIAGNOSIS — D509 Iron deficiency anemia, unspecified: Secondary | ICD-10-CM | POA: Diagnosis not present

## 2014-01-11 DIAGNOSIS — D631 Anemia in chronic kidney disease: Secondary | ICD-10-CM | POA: Diagnosis not present

## 2014-01-11 DIAGNOSIS — N186 End stage renal disease: Secondary | ICD-10-CM | POA: Diagnosis not present

## 2014-01-11 DIAGNOSIS — D509 Iron deficiency anemia, unspecified: Secondary | ICD-10-CM | POA: Diagnosis not present

## 2014-01-11 DIAGNOSIS — N2581 Secondary hyperparathyroidism of renal origin: Secondary | ICD-10-CM | POA: Diagnosis not present

## 2014-01-13 DIAGNOSIS — N2581 Secondary hyperparathyroidism of renal origin: Secondary | ICD-10-CM | POA: Diagnosis not present

## 2014-01-13 DIAGNOSIS — D631 Anemia in chronic kidney disease: Secondary | ICD-10-CM | POA: Diagnosis not present

## 2014-01-13 DIAGNOSIS — N186 End stage renal disease: Secondary | ICD-10-CM | POA: Diagnosis not present

## 2014-01-13 DIAGNOSIS — D509 Iron deficiency anemia, unspecified: Secondary | ICD-10-CM | POA: Diagnosis not present

## 2014-01-15 DIAGNOSIS — N2581 Secondary hyperparathyroidism of renal origin: Secondary | ICD-10-CM | POA: Diagnosis not present

## 2014-01-15 DIAGNOSIS — N186 End stage renal disease: Secondary | ICD-10-CM | POA: Diagnosis not present

## 2014-01-15 DIAGNOSIS — D509 Iron deficiency anemia, unspecified: Secondary | ICD-10-CM | POA: Diagnosis not present

## 2014-01-15 DIAGNOSIS — D631 Anemia in chronic kidney disease: Secondary | ICD-10-CM | POA: Diagnosis not present

## 2014-01-17 DIAGNOSIS — N2581 Secondary hyperparathyroidism of renal origin: Secondary | ICD-10-CM | POA: Diagnosis not present

## 2014-01-17 DIAGNOSIS — N186 End stage renal disease: Secondary | ICD-10-CM | POA: Diagnosis not present

## 2014-01-17 DIAGNOSIS — D631 Anemia in chronic kidney disease: Secondary | ICD-10-CM | POA: Diagnosis not present

## 2014-01-17 DIAGNOSIS — D509 Iron deficiency anemia, unspecified: Secondary | ICD-10-CM | POA: Diagnosis not present

## 2014-01-19 DIAGNOSIS — N2581 Secondary hyperparathyroidism of renal origin: Secondary | ICD-10-CM | POA: Diagnosis not present

## 2014-01-19 DIAGNOSIS — N186 End stage renal disease: Secondary | ICD-10-CM | POA: Diagnosis not present

## 2014-01-19 DIAGNOSIS — D631 Anemia in chronic kidney disease: Secondary | ICD-10-CM | POA: Diagnosis not present

## 2014-01-19 DIAGNOSIS — D509 Iron deficiency anemia, unspecified: Secondary | ICD-10-CM | POA: Diagnosis not present

## 2014-01-22 DIAGNOSIS — D509 Iron deficiency anemia, unspecified: Secondary | ICD-10-CM | POA: Diagnosis not present

## 2014-01-22 DIAGNOSIS — N2581 Secondary hyperparathyroidism of renal origin: Secondary | ICD-10-CM | POA: Diagnosis not present

## 2014-01-22 DIAGNOSIS — N186 End stage renal disease: Secondary | ICD-10-CM | POA: Diagnosis not present

## 2014-01-22 DIAGNOSIS — D631 Anemia in chronic kidney disease: Secondary | ICD-10-CM | POA: Diagnosis not present

## 2014-01-25 DIAGNOSIS — Z992 Dependence on renal dialysis: Secondary | ICD-10-CM | POA: Diagnosis not present

## 2014-01-25 DIAGNOSIS — N2581 Secondary hyperparathyroidism of renal origin: Secondary | ICD-10-CM | POA: Diagnosis not present

## 2014-01-25 DIAGNOSIS — D631 Anemia in chronic kidney disease: Secondary | ICD-10-CM | POA: Diagnosis not present

## 2014-01-25 DIAGNOSIS — D509 Iron deficiency anemia, unspecified: Secondary | ICD-10-CM | POA: Diagnosis not present

## 2014-01-25 DIAGNOSIS — N186 End stage renal disease: Secondary | ICD-10-CM | POA: Diagnosis not present

## 2014-01-27 DIAGNOSIS — D509 Iron deficiency anemia, unspecified: Secondary | ICD-10-CM | POA: Diagnosis not present

## 2014-01-27 DIAGNOSIS — N186 End stage renal disease: Secondary | ICD-10-CM | POA: Diagnosis not present

## 2014-01-27 DIAGNOSIS — D631 Anemia in chronic kidney disease: Secondary | ICD-10-CM | POA: Diagnosis not present

## 2014-01-27 DIAGNOSIS — N2581 Secondary hyperparathyroidism of renal origin: Secondary | ICD-10-CM | POA: Diagnosis not present

## 2014-02-25 DIAGNOSIS — N186 End stage renal disease: Secondary | ICD-10-CM | POA: Diagnosis not present

## 2014-02-25 DIAGNOSIS — Z992 Dependence on renal dialysis: Secondary | ICD-10-CM | POA: Diagnosis not present

## 2014-02-27 DIAGNOSIS — N186 End stage renal disease: Secondary | ICD-10-CM | POA: Diagnosis not present

## 2014-02-27 DIAGNOSIS — D509 Iron deficiency anemia, unspecified: Secondary | ICD-10-CM | POA: Diagnosis not present

## 2014-02-27 DIAGNOSIS — N2581 Secondary hyperparathyroidism of renal origin: Secondary | ICD-10-CM | POA: Diagnosis not present

## 2014-02-27 DIAGNOSIS — D631 Anemia in chronic kidney disease: Secondary | ICD-10-CM | POA: Diagnosis not present

## 2014-03-01 DIAGNOSIS — D509 Iron deficiency anemia, unspecified: Secondary | ICD-10-CM | POA: Diagnosis not present

## 2014-03-01 DIAGNOSIS — D631 Anemia in chronic kidney disease: Secondary | ICD-10-CM | POA: Diagnosis not present

## 2014-03-01 DIAGNOSIS — N2581 Secondary hyperparathyroidism of renal origin: Secondary | ICD-10-CM | POA: Diagnosis not present

## 2014-03-01 DIAGNOSIS — N186 End stage renal disease: Secondary | ICD-10-CM | POA: Diagnosis not present

## 2014-03-03 DIAGNOSIS — D509 Iron deficiency anemia, unspecified: Secondary | ICD-10-CM | POA: Diagnosis not present

## 2014-03-03 DIAGNOSIS — D631 Anemia in chronic kidney disease: Secondary | ICD-10-CM | POA: Diagnosis not present

## 2014-03-03 DIAGNOSIS — N2581 Secondary hyperparathyroidism of renal origin: Secondary | ICD-10-CM | POA: Diagnosis not present

## 2014-03-03 DIAGNOSIS — N186 End stage renal disease: Secondary | ICD-10-CM | POA: Diagnosis not present

## 2014-03-05 DIAGNOSIS — N2581 Secondary hyperparathyroidism of renal origin: Secondary | ICD-10-CM | POA: Diagnosis not present

## 2014-03-05 DIAGNOSIS — D631 Anemia in chronic kidney disease: Secondary | ICD-10-CM | POA: Diagnosis not present

## 2014-03-05 DIAGNOSIS — N186 End stage renal disease: Secondary | ICD-10-CM | POA: Diagnosis not present

## 2014-03-05 DIAGNOSIS — D509 Iron deficiency anemia, unspecified: Secondary | ICD-10-CM | POA: Diagnosis not present

## 2014-03-08 DIAGNOSIS — N2581 Secondary hyperparathyroidism of renal origin: Secondary | ICD-10-CM | POA: Diagnosis not present

## 2014-03-08 DIAGNOSIS — N186 End stage renal disease: Secondary | ICD-10-CM | POA: Diagnosis not present

## 2014-03-08 DIAGNOSIS — D509 Iron deficiency anemia, unspecified: Secondary | ICD-10-CM | POA: Diagnosis not present

## 2014-03-08 DIAGNOSIS — D631 Anemia in chronic kidney disease: Secondary | ICD-10-CM | POA: Diagnosis not present

## 2014-03-10 DIAGNOSIS — N2581 Secondary hyperparathyroidism of renal origin: Secondary | ICD-10-CM | POA: Diagnosis not present

## 2014-03-10 DIAGNOSIS — D631 Anemia in chronic kidney disease: Secondary | ICD-10-CM | POA: Diagnosis not present

## 2014-03-10 DIAGNOSIS — D509 Iron deficiency anemia, unspecified: Secondary | ICD-10-CM | POA: Diagnosis not present

## 2014-03-10 DIAGNOSIS — N186 End stage renal disease: Secondary | ICD-10-CM | POA: Diagnosis not present

## 2014-03-12 DIAGNOSIS — D631 Anemia in chronic kidney disease: Secondary | ICD-10-CM | POA: Diagnosis not present

## 2014-03-12 DIAGNOSIS — N2581 Secondary hyperparathyroidism of renal origin: Secondary | ICD-10-CM | POA: Diagnosis not present

## 2014-03-12 DIAGNOSIS — N186 End stage renal disease: Secondary | ICD-10-CM | POA: Diagnosis not present

## 2014-03-12 DIAGNOSIS — D509 Iron deficiency anemia, unspecified: Secondary | ICD-10-CM | POA: Diagnosis not present

## 2014-03-15 DIAGNOSIS — D631 Anemia in chronic kidney disease: Secondary | ICD-10-CM | POA: Diagnosis not present

## 2014-03-15 DIAGNOSIS — N186 End stage renal disease: Secondary | ICD-10-CM | POA: Diagnosis not present

## 2014-03-15 DIAGNOSIS — D509 Iron deficiency anemia, unspecified: Secondary | ICD-10-CM | POA: Diagnosis not present

## 2014-03-15 DIAGNOSIS — N2581 Secondary hyperparathyroidism of renal origin: Secondary | ICD-10-CM | POA: Diagnosis not present

## 2014-03-16 DIAGNOSIS — N186 End stage renal disease: Secondary | ICD-10-CM | POA: Diagnosis not present

## 2014-03-16 DIAGNOSIS — I10 Essential (primary) hypertension: Secondary | ICD-10-CM | POA: Diagnosis not present

## 2014-03-16 DIAGNOSIS — T859XXA Unspecified complication of internal prosthetic device, implant and graft, initial encounter: Secondary | ICD-10-CM | POA: Diagnosis not present

## 2014-03-17 DIAGNOSIS — D509 Iron deficiency anemia, unspecified: Secondary | ICD-10-CM | POA: Diagnosis not present

## 2014-03-17 DIAGNOSIS — D631 Anemia in chronic kidney disease: Secondary | ICD-10-CM | POA: Diagnosis not present

## 2014-03-17 DIAGNOSIS — N186 End stage renal disease: Secondary | ICD-10-CM | POA: Diagnosis not present

## 2014-03-17 DIAGNOSIS — N2581 Secondary hyperparathyroidism of renal origin: Secondary | ICD-10-CM | POA: Diagnosis not present

## 2014-03-19 DIAGNOSIS — D509 Iron deficiency anemia, unspecified: Secondary | ICD-10-CM | POA: Diagnosis not present

## 2014-03-19 DIAGNOSIS — N2581 Secondary hyperparathyroidism of renal origin: Secondary | ICD-10-CM | POA: Diagnosis not present

## 2014-03-19 DIAGNOSIS — N186 End stage renal disease: Secondary | ICD-10-CM | POA: Diagnosis not present

## 2014-03-19 DIAGNOSIS — D631 Anemia in chronic kidney disease: Secondary | ICD-10-CM | POA: Diagnosis not present

## 2014-03-22 DIAGNOSIS — D509 Iron deficiency anemia, unspecified: Secondary | ICD-10-CM | POA: Diagnosis not present

## 2014-03-22 DIAGNOSIS — D631 Anemia in chronic kidney disease: Secondary | ICD-10-CM | POA: Diagnosis not present

## 2014-03-22 DIAGNOSIS — N2581 Secondary hyperparathyroidism of renal origin: Secondary | ICD-10-CM | POA: Diagnosis not present

## 2014-03-22 DIAGNOSIS — N186 End stage renal disease: Secondary | ICD-10-CM | POA: Diagnosis not present

## 2014-03-24 DIAGNOSIS — N2581 Secondary hyperparathyroidism of renal origin: Secondary | ICD-10-CM | POA: Diagnosis not present

## 2014-03-24 DIAGNOSIS — D631 Anemia in chronic kidney disease: Secondary | ICD-10-CM | POA: Diagnosis not present

## 2014-03-24 DIAGNOSIS — N186 End stage renal disease: Secondary | ICD-10-CM | POA: Diagnosis not present

## 2014-03-24 DIAGNOSIS — D509 Iron deficiency anemia, unspecified: Secondary | ICD-10-CM | POA: Diagnosis not present

## 2014-03-26 DIAGNOSIS — D509 Iron deficiency anemia, unspecified: Secondary | ICD-10-CM | POA: Diagnosis not present

## 2014-03-26 DIAGNOSIS — N186 End stage renal disease: Secondary | ICD-10-CM | POA: Diagnosis not present

## 2014-03-26 DIAGNOSIS — D631 Anemia in chronic kidney disease: Secondary | ICD-10-CM | POA: Diagnosis not present

## 2014-03-26 DIAGNOSIS — N2581 Secondary hyperparathyroidism of renal origin: Secondary | ICD-10-CM | POA: Diagnosis not present

## 2014-03-28 DIAGNOSIS — N186 End stage renal disease: Secondary | ICD-10-CM | POA: Diagnosis not present

## 2014-03-28 DIAGNOSIS — Z992 Dependence on renal dialysis: Secondary | ICD-10-CM | POA: Diagnosis not present

## 2014-03-29 DIAGNOSIS — D509 Iron deficiency anemia, unspecified: Secondary | ICD-10-CM | POA: Diagnosis not present

## 2014-03-29 DIAGNOSIS — N186 End stage renal disease: Secondary | ICD-10-CM | POA: Diagnosis not present

## 2014-03-29 DIAGNOSIS — D631 Anemia in chronic kidney disease: Secondary | ICD-10-CM | POA: Diagnosis not present

## 2014-03-29 DIAGNOSIS — N2581 Secondary hyperparathyroidism of renal origin: Secondary | ICD-10-CM | POA: Diagnosis not present

## 2014-03-31 DIAGNOSIS — D509 Iron deficiency anemia, unspecified: Secondary | ICD-10-CM | POA: Diagnosis not present

## 2014-03-31 DIAGNOSIS — N2581 Secondary hyperparathyroidism of renal origin: Secondary | ICD-10-CM | POA: Diagnosis not present

## 2014-03-31 DIAGNOSIS — D631 Anemia in chronic kidney disease: Secondary | ICD-10-CM | POA: Diagnosis not present

## 2014-03-31 DIAGNOSIS — N186 End stage renal disease: Secondary | ICD-10-CM | POA: Diagnosis not present

## 2014-04-02 DIAGNOSIS — D631 Anemia in chronic kidney disease: Secondary | ICD-10-CM | POA: Diagnosis not present

## 2014-04-02 DIAGNOSIS — N2581 Secondary hyperparathyroidism of renal origin: Secondary | ICD-10-CM | POA: Diagnosis not present

## 2014-04-02 DIAGNOSIS — D509 Iron deficiency anemia, unspecified: Secondary | ICD-10-CM | POA: Diagnosis not present

## 2014-04-02 DIAGNOSIS — N186 End stage renal disease: Secondary | ICD-10-CM | POA: Diagnosis not present

## 2014-04-05 DIAGNOSIS — N2581 Secondary hyperparathyroidism of renal origin: Secondary | ICD-10-CM | POA: Diagnosis not present

## 2014-04-05 DIAGNOSIS — D509 Iron deficiency anemia, unspecified: Secondary | ICD-10-CM | POA: Diagnosis not present

## 2014-04-05 DIAGNOSIS — N186 End stage renal disease: Secondary | ICD-10-CM | POA: Diagnosis not present

## 2014-04-05 DIAGNOSIS — D631 Anemia in chronic kidney disease: Secondary | ICD-10-CM | POA: Diagnosis not present

## 2014-04-07 DIAGNOSIS — N2581 Secondary hyperparathyroidism of renal origin: Secondary | ICD-10-CM | POA: Diagnosis not present

## 2014-04-07 DIAGNOSIS — D631 Anemia in chronic kidney disease: Secondary | ICD-10-CM | POA: Diagnosis not present

## 2014-04-07 DIAGNOSIS — N186 End stage renal disease: Secondary | ICD-10-CM | POA: Diagnosis not present

## 2014-04-07 DIAGNOSIS — D509 Iron deficiency anemia, unspecified: Secondary | ICD-10-CM | POA: Diagnosis not present

## 2014-04-09 DIAGNOSIS — D631 Anemia in chronic kidney disease: Secondary | ICD-10-CM | POA: Diagnosis not present

## 2014-04-09 DIAGNOSIS — D509 Iron deficiency anemia, unspecified: Secondary | ICD-10-CM | POA: Diagnosis not present

## 2014-04-09 DIAGNOSIS — N2581 Secondary hyperparathyroidism of renal origin: Secondary | ICD-10-CM | POA: Diagnosis not present

## 2014-04-09 DIAGNOSIS — N186 End stage renal disease: Secondary | ICD-10-CM | POA: Diagnosis not present

## 2014-04-12 DIAGNOSIS — D631 Anemia in chronic kidney disease: Secondary | ICD-10-CM | POA: Diagnosis not present

## 2014-04-12 DIAGNOSIS — D509 Iron deficiency anemia, unspecified: Secondary | ICD-10-CM | POA: Diagnosis not present

## 2014-04-12 DIAGNOSIS — N2581 Secondary hyperparathyroidism of renal origin: Secondary | ICD-10-CM | POA: Diagnosis not present

## 2014-04-12 DIAGNOSIS — N186 End stage renal disease: Secondary | ICD-10-CM | POA: Diagnosis not present

## 2014-04-14 DIAGNOSIS — D631 Anemia in chronic kidney disease: Secondary | ICD-10-CM | POA: Diagnosis not present

## 2014-04-14 DIAGNOSIS — D509 Iron deficiency anemia, unspecified: Secondary | ICD-10-CM | POA: Diagnosis not present

## 2014-04-14 DIAGNOSIS — N186 End stage renal disease: Secondary | ICD-10-CM | POA: Diagnosis not present

## 2014-04-14 DIAGNOSIS — N2581 Secondary hyperparathyroidism of renal origin: Secondary | ICD-10-CM | POA: Diagnosis not present

## 2014-04-16 DIAGNOSIS — D631 Anemia in chronic kidney disease: Secondary | ICD-10-CM | POA: Diagnosis not present

## 2014-04-16 DIAGNOSIS — D509 Iron deficiency anemia, unspecified: Secondary | ICD-10-CM | POA: Diagnosis not present

## 2014-04-16 DIAGNOSIS — N2581 Secondary hyperparathyroidism of renal origin: Secondary | ICD-10-CM | POA: Diagnosis not present

## 2014-04-16 DIAGNOSIS — N186 End stage renal disease: Secondary | ICD-10-CM | POA: Diagnosis not present

## 2014-04-19 DIAGNOSIS — N2581 Secondary hyperparathyroidism of renal origin: Secondary | ICD-10-CM | POA: Diagnosis not present

## 2014-04-19 DIAGNOSIS — D631 Anemia in chronic kidney disease: Secondary | ICD-10-CM | POA: Diagnosis not present

## 2014-04-19 DIAGNOSIS — D509 Iron deficiency anemia, unspecified: Secondary | ICD-10-CM | POA: Diagnosis not present

## 2014-04-19 DIAGNOSIS — N186 End stage renal disease: Secondary | ICD-10-CM | POA: Diagnosis not present

## 2014-04-21 DIAGNOSIS — D509 Iron deficiency anemia, unspecified: Secondary | ICD-10-CM | POA: Diagnosis not present

## 2014-04-21 DIAGNOSIS — D631 Anemia in chronic kidney disease: Secondary | ICD-10-CM | POA: Diagnosis not present

## 2014-04-21 DIAGNOSIS — N2581 Secondary hyperparathyroidism of renal origin: Secondary | ICD-10-CM | POA: Diagnosis not present

## 2014-04-21 DIAGNOSIS — N186 End stage renal disease: Secondary | ICD-10-CM | POA: Diagnosis not present

## 2014-04-22 ENCOUNTER — Other Ambulatory Visit: Payer: Self-pay

## 2014-04-22 DIAGNOSIS — Z1231 Encounter for screening mammogram for malignant neoplasm of breast: Secondary | ICD-10-CM

## 2014-04-23 DIAGNOSIS — N2581 Secondary hyperparathyroidism of renal origin: Secondary | ICD-10-CM | POA: Diagnosis not present

## 2014-04-23 DIAGNOSIS — N186 End stage renal disease: Secondary | ICD-10-CM | POA: Diagnosis not present

## 2014-04-23 DIAGNOSIS — D509 Iron deficiency anemia, unspecified: Secondary | ICD-10-CM | POA: Diagnosis not present

## 2014-04-23 DIAGNOSIS — D631 Anemia in chronic kidney disease: Secondary | ICD-10-CM | POA: Diagnosis not present

## 2014-04-26 DIAGNOSIS — N2581 Secondary hyperparathyroidism of renal origin: Secondary | ICD-10-CM | POA: Diagnosis not present

## 2014-04-26 DIAGNOSIS — D631 Anemia in chronic kidney disease: Secondary | ICD-10-CM | POA: Diagnosis not present

## 2014-04-26 DIAGNOSIS — Z992 Dependence on renal dialysis: Secondary | ICD-10-CM | POA: Diagnosis not present

## 2014-04-26 DIAGNOSIS — D509 Iron deficiency anemia, unspecified: Secondary | ICD-10-CM | POA: Diagnosis not present

## 2014-04-26 DIAGNOSIS — N186 End stage renal disease: Secondary | ICD-10-CM | POA: Diagnosis not present

## 2014-04-28 DIAGNOSIS — D509 Iron deficiency anemia, unspecified: Secondary | ICD-10-CM | POA: Diagnosis not present

## 2014-04-28 DIAGNOSIS — N186 End stage renal disease: Secondary | ICD-10-CM | POA: Diagnosis not present

## 2014-04-28 DIAGNOSIS — D631 Anemia in chronic kidney disease: Secondary | ICD-10-CM | POA: Diagnosis not present

## 2014-04-28 DIAGNOSIS — N2581 Secondary hyperparathyroidism of renal origin: Secondary | ICD-10-CM | POA: Diagnosis not present

## 2014-04-30 DIAGNOSIS — N186 End stage renal disease: Secondary | ICD-10-CM | POA: Diagnosis not present

## 2014-04-30 DIAGNOSIS — D631 Anemia in chronic kidney disease: Secondary | ICD-10-CM | POA: Diagnosis not present

## 2014-04-30 DIAGNOSIS — N2581 Secondary hyperparathyroidism of renal origin: Secondary | ICD-10-CM | POA: Diagnosis not present

## 2014-04-30 DIAGNOSIS — D509 Iron deficiency anemia, unspecified: Secondary | ICD-10-CM | POA: Diagnosis not present

## 2014-05-03 DIAGNOSIS — N186 End stage renal disease: Secondary | ICD-10-CM | POA: Diagnosis not present

## 2014-05-03 DIAGNOSIS — D509 Iron deficiency anemia, unspecified: Secondary | ICD-10-CM | POA: Diagnosis not present

## 2014-05-03 DIAGNOSIS — N2581 Secondary hyperparathyroidism of renal origin: Secondary | ICD-10-CM | POA: Diagnosis not present

## 2014-05-03 DIAGNOSIS — D631 Anemia in chronic kidney disease: Secondary | ICD-10-CM | POA: Diagnosis not present

## 2014-05-05 DIAGNOSIS — N2581 Secondary hyperparathyroidism of renal origin: Secondary | ICD-10-CM | POA: Diagnosis not present

## 2014-05-05 DIAGNOSIS — D631 Anemia in chronic kidney disease: Secondary | ICD-10-CM | POA: Diagnosis not present

## 2014-05-05 DIAGNOSIS — N186 End stage renal disease: Secondary | ICD-10-CM | POA: Diagnosis not present

## 2014-05-05 DIAGNOSIS — D509 Iron deficiency anemia, unspecified: Secondary | ICD-10-CM | POA: Diagnosis not present

## 2014-05-07 DIAGNOSIS — D631 Anemia in chronic kidney disease: Secondary | ICD-10-CM | POA: Diagnosis not present

## 2014-05-07 DIAGNOSIS — N186 End stage renal disease: Secondary | ICD-10-CM | POA: Diagnosis not present

## 2014-05-07 DIAGNOSIS — N2581 Secondary hyperparathyroidism of renal origin: Secondary | ICD-10-CM | POA: Diagnosis not present

## 2014-05-07 DIAGNOSIS — D509 Iron deficiency anemia, unspecified: Secondary | ICD-10-CM | POA: Diagnosis not present

## 2014-05-10 DIAGNOSIS — D509 Iron deficiency anemia, unspecified: Secondary | ICD-10-CM | POA: Diagnosis not present

## 2014-05-10 DIAGNOSIS — N186 End stage renal disease: Secondary | ICD-10-CM | POA: Diagnosis not present

## 2014-05-10 DIAGNOSIS — D631 Anemia in chronic kidney disease: Secondary | ICD-10-CM | POA: Diagnosis not present

## 2014-05-10 DIAGNOSIS — N2581 Secondary hyperparathyroidism of renal origin: Secondary | ICD-10-CM | POA: Diagnosis not present

## 2014-05-12 DIAGNOSIS — D631 Anemia in chronic kidney disease: Secondary | ICD-10-CM | POA: Diagnosis not present

## 2014-05-12 DIAGNOSIS — D509 Iron deficiency anemia, unspecified: Secondary | ICD-10-CM | POA: Diagnosis not present

## 2014-05-12 DIAGNOSIS — N186 End stage renal disease: Secondary | ICD-10-CM | POA: Diagnosis not present

## 2014-05-12 DIAGNOSIS — N2581 Secondary hyperparathyroidism of renal origin: Secondary | ICD-10-CM | POA: Diagnosis not present

## 2014-05-14 DIAGNOSIS — N186 End stage renal disease: Secondary | ICD-10-CM | POA: Diagnosis not present

## 2014-05-14 DIAGNOSIS — D631 Anemia in chronic kidney disease: Secondary | ICD-10-CM | POA: Diagnosis not present

## 2014-05-14 DIAGNOSIS — D509 Iron deficiency anemia, unspecified: Secondary | ICD-10-CM | POA: Diagnosis not present

## 2014-05-14 DIAGNOSIS — N2581 Secondary hyperparathyroidism of renal origin: Secondary | ICD-10-CM | POA: Diagnosis not present

## 2014-05-17 DIAGNOSIS — D631 Anemia in chronic kidney disease: Secondary | ICD-10-CM | POA: Diagnosis not present

## 2014-05-17 DIAGNOSIS — N186 End stage renal disease: Secondary | ICD-10-CM | POA: Diagnosis not present

## 2014-05-17 DIAGNOSIS — D509 Iron deficiency anemia, unspecified: Secondary | ICD-10-CM | POA: Diagnosis not present

## 2014-05-17 DIAGNOSIS — N2581 Secondary hyperparathyroidism of renal origin: Secondary | ICD-10-CM | POA: Diagnosis not present

## 2014-05-18 ENCOUNTER — Ambulatory Visit
Admission: RE | Admit: 2014-05-18 | Discharge: 2014-05-18 | Disposition: A | Discharge status: Home / Self Care | Payer: Medicare Other | Source: Ambulatory Visit

## 2014-05-18 DIAGNOSIS — Z1231 Encounter for screening mammogram for malignant neoplasm of breast: Secondary | ICD-10-CM

## 2014-05-19 DIAGNOSIS — D509 Iron deficiency anemia, unspecified: Secondary | ICD-10-CM | POA: Diagnosis not present

## 2014-05-19 DIAGNOSIS — N186 End stage renal disease: Secondary | ICD-10-CM | POA: Diagnosis not present

## 2014-05-19 DIAGNOSIS — D631 Anemia in chronic kidney disease: Secondary | ICD-10-CM | POA: Diagnosis not present

## 2014-05-19 DIAGNOSIS — N2581 Secondary hyperparathyroidism of renal origin: Secondary | ICD-10-CM | POA: Diagnosis not present

## 2014-05-21 DIAGNOSIS — D509 Iron deficiency anemia, unspecified: Secondary | ICD-10-CM | POA: Diagnosis not present

## 2014-05-21 DIAGNOSIS — D631 Anemia in chronic kidney disease: Secondary | ICD-10-CM | POA: Diagnosis not present

## 2014-05-21 DIAGNOSIS — N2581 Secondary hyperparathyroidism of renal origin: Secondary | ICD-10-CM | POA: Diagnosis not present

## 2014-05-21 DIAGNOSIS — N186 End stage renal disease: Secondary | ICD-10-CM | POA: Diagnosis not present

## 2014-05-24 DIAGNOSIS — D631 Anemia in chronic kidney disease: Secondary | ICD-10-CM | POA: Diagnosis not present

## 2014-05-24 DIAGNOSIS — N2581 Secondary hyperparathyroidism of renal origin: Secondary | ICD-10-CM | POA: Diagnosis not present

## 2014-05-24 DIAGNOSIS — D509 Iron deficiency anemia, unspecified: Secondary | ICD-10-CM | POA: Diagnosis not present

## 2014-05-24 DIAGNOSIS — N186 End stage renal disease: Secondary | ICD-10-CM | POA: Diagnosis not present

## 2014-05-26 DIAGNOSIS — N2581 Secondary hyperparathyroidism of renal origin: Secondary | ICD-10-CM | POA: Diagnosis not present

## 2014-05-26 DIAGNOSIS — D631 Anemia in chronic kidney disease: Secondary | ICD-10-CM | POA: Diagnosis not present

## 2014-05-26 DIAGNOSIS — D509 Iron deficiency anemia, unspecified: Secondary | ICD-10-CM | POA: Diagnosis not present

## 2014-05-26 DIAGNOSIS — N186 End stage renal disease: Secondary | ICD-10-CM | POA: Diagnosis not present

## 2014-05-27 DIAGNOSIS — N186 End stage renal disease: Secondary | ICD-10-CM | POA: Diagnosis not present

## 2014-05-27 DIAGNOSIS — N039 Chronic nephritic syndrome with unspecified morphologic changes: Secondary | ICD-10-CM | POA: Diagnosis not present

## 2014-05-27 DIAGNOSIS — Z992 Dependence on renal dialysis: Secondary | ICD-10-CM | POA: Diagnosis not present

## 2014-05-28 DIAGNOSIS — D631 Anemia in chronic kidney disease: Secondary | ICD-10-CM | POA: Diagnosis not present

## 2014-05-28 DIAGNOSIS — D509 Iron deficiency anemia, unspecified: Secondary | ICD-10-CM | POA: Diagnosis not present

## 2014-05-28 DIAGNOSIS — N2581 Secondary hyperparathyroidism of renal origin: Secondary | ICD-10-CM | POA: Diagnosis not present

## 2014-05-28 DIAGNOSIS — N186 End stage renal disease: Secondary | ICD-10-CM | POA: Diagnosis not present

## 2014-05-31 DIAGNOSIS — D509 Iron deficiency anemia, unspecified: Secondary | ICD-10-CM | POA: Diagnosis not present

## 2014-05-31 DIAGNOSIS — D631 Anemia in chronic kidney disease: Secondary | ICD-10-CM | POA: Diagnosis not present

## 2014-05-31 DIAGNOSIS — N2581 Secondary hyperparathyroidism of renal origin: Secondary | ICD-10-CM | POA: Diagnosis not present

## 2014-05-31 DIAGNOSIS — N186 End stage renal disease: Secondary | ICD-10-CM | POA: Diagnosis not present

## 2014-06-02 DIAGNOSIS — N186 End stage renal disease: Secondary | ICD-10-CM | POA: Diagnosis not present

## 2014-06-02 DIAGNOSIS — N2581 Secondary hyperparathyroidism of renal origin: Secondary | ICD-10-CM | POA: Diagnosis not present

## 2014-06-02 DIAGNOSIS — D631 Anemia in chronic kidney disease: Secondary | ICD-10-CM | POA: Diagnosis not present

## 2014-06-02 DIAGNOSIS — D509 Iron deficiency anemia, unspecified: Secondary | ICD-10-CM | POA: Diagnosis not present

## 2014-06-04 DIAGNOSIS — N186 End stage renal disease: Secondary | ICD-10-CM | POA: Diagnosis not present

## 2014-06-04 DIAGNOSIS — N2581 Secondary hyperparathyroidism of renal origin: Secondary | ICD-10-CM | POA: Diagnosis not present

## 2014-06-04 DIAGNOSIS — D509 Iron deficiency anemia, unspecified: Secondary | ICD-10-CM | POA: Diagnosis not present

## 2014-06-04 DIAGNOSIS — D631 Anemia in chronic kidney disease: Secondary | ICD-10-CM | POA: Diagnosis not present

## 2014-06-07 DIAGNOSIS — D631 Anemia in chronic kidney disease: Secondary | ICD-10-CM | POA: Diagnosis not present

## 2014-06-07 DIAGNOSIS — N2581 Secondary hyperparathyroidism of renal origin: Secondary | ICD-10-CM | POA: Diagnosis not present

## 2014-06-07 DIAGNOSIS — D509 Iron deficiency anemia, unspecified: Secondary | ICD-10-CM | POA: Diagnosis not present

## 2014-06-07 DIAGNOSIS — N186 End stage renal disease: Secondary | ICD-10-CM | POA: Diagnosis not present

## 2014-06-09 DIAGNOSIS — D631 Anemia in chronic kidney disease: Secondary | ICD-10-CM | POA: Diagnosis not present

## 2014-06-09 DIAGNOSIS — D509 Iron deficiency anemia, unspecified: Secondary | ICD-10-CM | POA: Diagnosis not present

## 2014-06-09 DIAGNOSIS — N2581 Secondary hyperparathyroidism of renal origin: Secondary | ICD-10-CM | POA: Diagnosis not present

## 2014-06-09 DIAGNOSIS — N186 End stage renal disease: Secondary | ICD-10-CM | POA: Diagnosis not present

## 2014-06-11 DIAGNOSIS — D509 Iron deficiency anemia, unspecified: Secondary | ICD-10-CM | POA: Diagnosis not present

## 2014-06-11 DIAGNOSIS — D631 Anemia in chronic kidney disease: Secondary | ICD-10-CM | POA: Diagnosis not present

## 2014-06-11 DIAGNOSIS — N186 End stage renal disease: Secondary | ICD-10-CM | POA: Diagnosis not present

## 2014-06-11 DIAGNOSIS — N2581 Secondary hyperparathyroidism of renal origin: Secondary | ICD-10-CM | POA: Diagnosis not present

## 2014-06-14 DIAGNOSIS — D631 Anemia in chronic kidney disease: Secondary | ICD-10-CM | POA: Diagnosis not present

## 2014-06-14 DIAGNOSIS — N2581 Secondary hyperparathyroidism of renal origin: Secondary | ICD-10-CM | POA: Diagnosis not present

## 2014-06-14 DIAGNOSIS — D509 Iron deficiency anemia, unspecified: Secondary | ICD-10-CM | POA: Diagnosis not present

## 2014-06-14 DIAGNOSIS — N186 End stage renal disease: Secondary | ICD-10-CM | POA: Diagnosis not present

## 2014-06-15 NOTE — Op Note (Signed)
PATIENT NAME:  [name] Marks, [name] Marks MR#:  [id number] DATE OF BIRTH:  Oct 15, 1955  DATE OF PROCEDURE:  01/31/2012  PREOPERATIVE DIAGNOSES:  1. End-stage renal disease.  2. Poorly functioning right arm AV shunt.  3. Hypertension.   POSTOPERATIVE DIAGNOSES:  1. End-stage renal disease.  2. Poorly functioning right arm AV shunt.  3. Hypertension.   PROCEDURES:  1. Right upper extremity shuntogram and central venogram.  2. Ultrasound guidance for vascular access to right arm AV graft. 3. Percutaneous transluminal angioplasty of axillary vein just beyond the venous anastomosis with 7 mm diameter angioplasty balloon.   SURGEON: Algernon Huxley, MD   ANESTHESIA: Local with moderate conscious sedation.   ESTIMATED BLOOD LOSS: Minimal.   INDICATION FOR PROCEDURE: This is a 59 year old African American female with end-stage renal disease. She has diminished flow in her right arm AV graft and noninvasive studies showing stenosis at or just beyond the venous anastomosis. We are planning to evaluate this further with angiography. Risks and benefits were discussed. Informed consent was obtained.   DESCRIPTION OF PROCEDURE: The patient is brought to the Vascular Interventional Radiology Suite. Right upper extremity is sterilely prepped and draped and a sterile surgical field was created. The graft was accessed without difficulty under direct ultrasound guidance and permanent image was recorded. Micropuncture wire and sheath were placed and I upsized to a 6 [place] sheath. Imaging showed an approximately 85% stenosis in the axillary vein just beyond the venous anastomosis. Central venous circulation was patent. I crossed the lesion without difficulty with a Magic torque wire. I gave the patient 2500 units of intravenous heparin for systemic anticoagulation. Balloon angioplasty was performed in this location with a 7 mm diameter angioplasty balloon. A tight waste was taken which broke at 20 atmospheres. With the balloon  inflated, an arteriogram was performed which showed the arterial anastomosis which was patent without significant stenosis. The balloon was then deflated and removed. Completion angiogram now showed this area to be patent with less than 20% residual stenosis and I elected to terminate the procedure. The sheath was removed around a 4-0 Monocryl purse-string suture. Pressure was held. Sterile dressing was placed. The patient tolerated the procedure well and was taken to the recovery room in stable condition.   ____________________________ Algernon Huxley, MD jsd:drc D: 01/31/2012 09:52:03 ET T: 01/31/2012 10:33:18 ET JOB#: [id number]  cc: Algernon Huxley, MD, <Dictator> Algernon Huxley MD ELECTRONICALLY SIGNED 01/31/2012 17:02

## 2014-06-16 DIAGNOSIS — D509 Iron deficiency anemia, unspecified: Secondary | ICD-10-CM | POA: Diagnosis not present

## 2014-06-16 DIAGNOSIS — N2581 Secondary hyperparathyroidism of renal origin: Secondary | ICD-10-CM | POA: Diagnosis not present

## 2014-06-16 DIAGNOSIS — D631 Anemia in chronic kidney disease: Secondary | ICD-10-CM | POA: Diagnosis not present

## 2014-06-16 DIAGNOSIS — N186 End stage renal disease: Secondary | ICD-10-CM | POA: Diagnosis not present

## 2014-06-18 DIAGNOSIS — N2581 Secondary hyperparathyroidism of renal origin: Secondary | ICD-10-CM | POA: Diagnosis not present

## 2014-06-18 DIAGNOSIS — N186 End stage renal disease: Secondary | ICD-10-CM | POA: Diagnosis not present

## 2014-06-18 DIAGNOSIS — D631 Anemia in chronic kidney disease: Secondary | ICD-10-CM | POA: Diagnosis not present

## 2014-06-18 DIAGNOSIS — D509 Iron deficiency anemia, unspecified: Secondary | ICD-10-CM | POA: Diagnosis not present

## 2014-06-18 NOTE — Op Note (Signed)
PATIENT NAME:  [name] Marks, [name] Marks MR#:  [id number] DATE OF BIRTH:  [birth year]  DATE OF PROCEDURE:  12/01/2012  PREOPERATIVE DIAGNOSES:  1.  End-stage renal disease.  2.  Clotted right arm arteriovenous graft.  3.  Hypertension.   POSTOPERATIVE DIAGNOSES:  1.  End-stage renal disease.  2.  Clotted right arm arteriovenous graft.  3.  Hypertension.   PROCEDURES:  1.  Ultrasound guidance for vascular access, right arm arteriovenous graft, both in antegrade and retrograde fashion.  2.  Right upper extremity shuntogram and central venogram.  3.  Catheter-directed thrombolysis with 4 mg of tPA to the arteriovenous graft.  4.  Mechanical rheolytic thrombectomy with the AngioJet AVX catheter to the arteriovenous graft.  5.  Fogarty embolectomy for residual arterial plug.  6.  Percutaneous transluminal angioplasty of midportion of the arteriovenous graft for stenosis with 7 mm diameter angioplasty balloon.  7.  Percutaneous transluminal angioplasty of the axillary vein for recurrent stenosis with 7 mm diameter angioplasty balloon.   SURGEON: Leotis Pain, M.D.   ANESTHESIA: Local with moderate conscious sedation.   ESTIMATED BLOOD LOSS: Approximately 25 mL.   INDICATION FOR PROCEDURE: This is Marks 59 year old African American female with end-stage renal disease. Her graft is clotted and we are attempting to salvage this.   DESCRIPTION OF PROCEDURE: The patient is brought to the vascular and interventional radiology suite. The right upper extremity was sterilely prepped and draped and Marks sterile surgical field was created. The graft was then accessed in both an antegrade and retrograde fashion. Crossing under direct ultrasound guidance, Marks micropuncture needle, Marks micropuncture wire, and sheath were placed. We upsized to 6-French sheaths bilaterally. I then used Terumo Advantage wires both antegrade and retrograde and instilled 4 mg of tPA with the AngioJet AVX catheter. This was allowed to dwell for between  15 and 20 minutes.   Mechanical rheolytic thrombectomy was then performed. This resulted in resolution of the majority of the thrombus in the arteriovenous graft. There was Marks residual arterial plug that was treated with Marks Fogarty embolectomy catheter successfully. There was then Marks stenosis in the axillary vein within the previously placed stents which was recurrent. There were also some areas of narrowing within the midportion of the graft, likely from repetitive sticks. Both of these areas were treated separately and individually with 7 mm diameter angioplasty balloon with good angiographic completion result and no significant residual stenosis.   At this point, I elected to terminate the procedure. Sheaths were pulled out around 4-0 Monocryl purse sutures. Pressure was held. Sterile dressing was placed. The patient tolerated the procedure well and was taken to the recovery room in stable condition.    ____________________________ Algernon Huxley, MD jsd:np D: 12/01/2012 14:42:53 ET T: 12/01/2012 14:55:20 ET JOB#: [id number]  cc: Algernon Huxley, MD, <Dictator> Algernon Huxley MD ELECTRONICALLY SIGNED 12/17/2012 10:21

## 2014-06-18 NOTE — Op Note (Signed)
PATIENT NAME:  [name] Marks, [name] Marks MR#:  [id number] DATE OF BIRTH:  1955-08-17  DATE OF PROCEDURE:  12/16/2012  PREOPERATIVE DIAGNOSES:  1.  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  2.  End-stage renal disease requiring hemodialysis.  3.  Morbid obesity.   POSTOPERATIVE DIAGNOSES:  1.  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  2.  End-stage renal disease requiring hemodialysis.  3.  Morbid obesity.   PROCEDURES PERFORMED: 1.  Contrast injection of right arm brachial axillary dialysis graft. 2.  Mechanical thrombectomy of right arm brachial axillary dialysis graft with Trerotola device.  3.  Percutaneous transluminal angioplasty of the venous portion of right arm brachial axillary dialysis graft to 8 mm.  4.  Arteriography of right arm beginning at the innominate.   SURGEON: Hortencia Pilar, MD  SEDATION: Versed 5 mg plus fentanyl 200 mcg administered IV. Continuous ECG, pulse oximetry and cardiopulmonary monitoring was performed throughout the entire procedure by the interventional radiology nurse. Total sedation time was 1 hour, 20 minutes.   ACCESS:  1.  Marks 6-French sheath in antegrade direction, right arm brachial axillary dialysis graft.  2.  Marks 6-French sheath in retrograde direction, right arm brachial axillary dialysis graft.   CONTRAST USED: Isovue 75 mL.   FLUOROSCOPY TIME: 7.7 minutes.   INDICATIONS: [name] is Marks 59 year old woman who presented yesterday for intervention of her thrombosed right arm AV graft. Status post intervention she was discharged. Unfortunately, at dialysis today, she was found to have re-thrombosed and she is therefore undergoing attempts at salvage. The risks and benefits were reviewed. All questions answered. The patient agrees to proceed.   DESCRIPTION OF PROCEDURE: The patient is taken to the special procedure suite, placed in the supine position with her right arm extended palm upward, and the right arm is prepped  and draped in sterile fashion. 1% lidocaine is then infiltrated in the soft tissues overlying the palpable graft near the arterial anastomosis and in an antegrade direction Marks micropuncture needle is inserted, microwire followed by microsheath, J-wire followed by Marks 6-French sheath. Marks small amount of contrast is injected verifying thrombus within the fistula. Floppy Glidewire and KMP catheter is then negotiated into the central venous anatomy which demonstrates that the right innominate vein and superior vena cava are widely patent. 3000 units of heparin is given. The KMP catheter is removed.   Trerotola device is opened on the field and advanced under fluoroscopy into the previously placed stents throughout the subclavian and beginning at the level of the stents in the proximal subclavian Trerotola device is engaged. Multiple passes are made from the subclavian all the way back to the tip of the sheath. After this the sheath is flushed and subsequently hand injection of contrast is utilized to demonstrate that there has been resolution of the thrombus within the venous portion of the graft as well as within the subclavian vein and there is now filling of the central venous system via injection through the sheath. The sheath is then flushed copiously with heparinized saline.   1% lidocaine is infiltrated in the soft tissues overlying the graft, at the level of the deltoid, and Marks micropuncture needle is inserted in Marks retrograde direction, microwire followed by microsheath, J-wire followed by Marks 6-French sheath. Trerotola device is then advanced under fluoroscopic guidance out into the distal brachial artery where the basket is opened. It is then slowly withdrawn back into the graft where the basket is then engaged and thrombectomy of  the arterial portion is performed. The Trerotola device is then advanced through the venous portion several times. Hand injection of contrast demonstrates that there is no thrombus  within the venous portion, however, there still is no forward flow. Based on this, through the retrograde sheath, Marks Kumpe and Glidewire are advanced so that the Kumpe is in the mid brachial artery and hand injection of contrast is used to demonstrate the brachial artery in the upper arm and antecubital fossa. The artery is free of hemodynamically significant stenoses and there is some filling of the graft. At the arterial anastomosis, Marks small cul-de-sac of approximately 1 cm in length is noted. However, there is no forward flow beyond this.   The Kumpe is removed from the retrograde sheath and the Trerotola device is advanced out again into the brachial artery, opened, and the basket is brought back just into the AV graft. The dilator and wire are then reintroduced into the antegrade sheath and the antegrade sheath is pulled back out of the graft leaving just the wire to secure the access and then the Trerotola device is engaged. Multiple passes are made with the sheath removed allowing for complete clearing of any thrombotic material at the level of the puncture site. The sheath is then reintroduced and in Marks similar fashion the retrograde sheath is removed leaving the wire and then the Trerotola device is performed through this area.   With both sheaths repositioned, hand injection of contrast demonstrates that there is now forward flow of contrast. There is marked irregularity from the apex of the graft to the beginning of the axillary subclavian stent. Magic torque wire is introduced into the central venous system and an 8 x 6 [place] balloon is advanced over the wire beginning at the distal edge of the subclavian stent. The balloon is inflated to 14 to 18 atmospheres; each inflation is for 1 minute. Three serial inflations are performed. At the apex of the graft, Marks final fourth inflation is performed. This requires that the sheath essentially be removed with the wire remaining. The balloon is then removed  securing the wire and the sheath is reintroduced. Hand injection of contrast now demonstrates rapid flow of contrast through the system with patency of the graft as well as the central stents with rapid flow of contrast into the atrium.   Because of the early rethrombosis, I elected to take the Kumpe and Glidewire through the retrograde sheath and negotiate the Kumpe into the aortic arch. Beginning at the level of the innominate, hand injection of contrast is then utilized to demonstrate the innominate, subclavian and brachial artery throughout its entire course. After review of these images, the Kumpe is removed. Both sheaths are removed after 4-0 Monocryl pursestring sutures are placed. There are no immediate complications.   INTERPRETATION: Initial views demonstrate that there is thrombus throughout the graft as well as thrombus within the subclavian stent. Innominate and superior vena cava appear patent and there is filling of the atrium and pulmonary arteries with injection at the central level.   After mechanical thrombectomy with the Trerotola, multiple passes in both antegrade and retrograde directions, there is clearing of the thrombus throughout the entire system. There is noted to be marked irregularity within the graft throughout the venous portion beginning just distal to the apex and extending to the leading edge of the axillary subclavian stent. This is treated with 8 mm balloon inflation using Marks [place] balloon inflated to 14 to 18 atmospheres with excellent result  and less than 10% residual stenosis.   Arterial imaging demonstrates the innominate and origin of the subclavian as well as the subclavian and axillary arteries throughout its course and the brachial are all widely patent. Of note, the patient does have Marks bovine arch anatomy.   SUMMARY: Successful thrombectomy of AV graft with verification of widely patent arterial inflow and patency of the central venous stents as well as the  central veins as described above.  ____________________________ Katha Cabal, MD ggs:sb D: 12/16/2012 15:34:44 ET T: 12/16/2012 16:28:20 ET JOB#: [id number]  cc: Katha Cabal, MD, <Dictator> Katha Cabal MD ELECTRONICALLY SIGNED 01/02/2013 8:14

## 2014-06-18 NOTE — Op Note (Signed)
PATIENT NAME:  [name] Marks, [name] Marks MR#:  [id number] DATE OF BIRTH:  03-Feb-1956  DATE OF PROCEDURE:  05/19/2012  PREOPERATIVE DIAGNOSES: 1.  End-stage renal disease.  2.  Clotted right arm arteriovenous graft.   POSTOPERATIVE DIAGNOSES:  1.  End-stage renal disease.  2.  Clotted right arm arteriovenous graft.  3.  Morbid obesity.  PROCEDURES: 1.  Ultrasound guidance for vascular access to right arm AV graft, in both an antegrade and retrograde fashion.  2.  Right upper extremity shuntogram and central venogram.  3.  Catheter-directed thrombolysis with 4 mg of TPA to the graft. 4.  Mechanical rheolytic thrombectomy to the graft.  5.  Fogarty embolectomy for residual arterial plug.  6.  Percutaneous transluminal angioplasty of arterial anastomosis with 6 mm diameter angioplasty balloon.  7.  Percutaneous transluminal angioplasty of arterial access site with 7 mm diameter angioplasty balloon.  8.  Percutaneous transluminal angioplasty of venous anastomosis and axillary vein with 7 and 9 mm diameter angioplasty balloon.  9.  Covered stent placement x 2 to the venous anastomosis and axillary vein for residual thrombosis after angioplasty.   SURGEON: Algernon Huxley, M.D.   ANESTHESIA: Local with moderate conscious sedation.   ESTIMATED BLOOD LOSS:  50 mL.  FLUOROSCOPY TIME: 11 minutes.   CONTRAST USED: 50 mL.   INDICATION FOR PROCEDURE: This is a 59 year old African American female with end-stage renal disease. Her AV graft is clotted and we are attempting to salvage this today. Risks and benefits were discussed. Informed consent was obtained.   DESCRIPTION OF PROCEDURE: The patient is brought to the vascular and interventional radiology suite. The right upper extremity is sterilely prepped and draped and a sterile surgical field was created. The graft is accessed in both an antegrade and retrograde fashion crossing, under direct ultrasound guidance, due to the pulseless nature of the graft, and  6-French sheaths are placed bilaterally. Imaging showed thrombosis of the graft. Catheter-directed thrombolysis was performed with 4 mg of TPA from the arterial anastomosis to the venous anastomosis and allowed to dwell for 15 minutes. The AngioJet AVX catheter was then used to perform mechanical rheolytic thrombectomy, which improved but did not resolve the thrombus within the graft. There was residual thrombus at the arterial plug and arterial access site. Fogarty embolectomy was performed. A 6 mm diameter angioplasty balloon was inflated, at the arterial anastomosis, and a 6 and then a 7 mm diameter angioplasty balloon at the arterial access site. This showed significant improvement after angioplasty. However, there was still residual thrombus sitting at the end of a previously placed stent, at the venous anastomosis. I treated this area initially with a 7 mm diameter angioplasty balloon and then a 9 mm diameter angioplasty balloon. With this there was still residual thrombus causing flow limitation. At this point, I elected to extend our covered stent area. An 8 FLAIR stent was initially deployed, but this did not cover the area of thrombus and match with the previously placed stent.  It went into the axillary vein. It did cover it more centrally beyond the thrombus and so I extended another covered stent to bridge the 2 areas with a 9 mm diameter Viabahn stent. This was postdilated with a 9 mm balloon with markedly improved flow seen after stent placement. At this point, I elected to terminate the procedure. I had upsized to a 9-French sheath in the antegrade direction. The 6-French retrograde sheath had already been removed around a 4-0 Monocryl and 4-0 Monocryl was used around  the 9-French area. Pressure was held, sterile dressing was placed. The patient tolerated the procedure well and was taken to the recovery room in stable condition.  ____________________________ Algernon Huxley, MD jsd:sb D: 05/19/2012  12:03:31 ET    T: 05/19/2012 12:19:18 ET        JOB#: [id number] cc: Algernon Huxley, MD, <Dictator> Algernon Huxley MD ELECTRONICALLY SIGNED 05/21/2012 17:09

## 2014-06-18 NOTE — Op Note (Signed)
PATIENT NAME:  [name], Kemaya MR#:  [id number] DATE OF BIRTH:  1955-09-26  DATE OF PROCEDURE:  04/21/2012  PREOPERATIVE DIAGNOSES:  1. 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  2. Complication, right arm brachial axillary dialysis graft.  3. End-stage renal disease, requiring hemodialysis.  4. Hypertension.  5. Morbid obesity.   POSTOPERATIVE DIAGNOSES: 1. 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  2. Complication, right arm brachial axillary dialysis graft.  3. End-stage renal disease, requiring hemodialysis.  4. Hypertension.  5. Morbid obesity.   PROCEDURES PERFORMED: 1. Contrast injection, right arm brachial axillary dialysis graft.  2. Mechanical thrombectomy, right arm brachial axillary dialysis graft.  3. Percutaneous transluminal angioplasty of the venous outflow at the anastomotic zone.  4. Placement of a 7 x 50 flared stent venous outflow for failed angioplasty.   Procedure performed by [employer].   SEDATION: Versed 4 mg, plus fentanyl 150 mcg administered IV.   Continuous ECG, pulse oximetry and cardiopulmonary monitoring was performed throughout the entire procedure by the interventional radiology nurse.   Total sedation time was 1 hour.   ACCESS:  1. A 7-French sheath, antegrade direction, right arm brachial axillary dialysis graft.  2. A 6-French sheath, retrograde direction, right arm brachial axillary dialysis graft.   CONTRAST USED: Isovue 31 mL.   FLUORO TIME: 3.8 minutes.   INDICATIONS: The patient presents with thrombosis of her AV access. She is therefore undergoing angiography with the hope for intervention. Risks and benefits were reviewed. All questions answered. The patient agrees to proceed.   DESCRIPTION OF PROCEDURE: The patient was taken to special procedures, placed in the supine position. After adequate sedation was achieved, the right arm was extended palm-upward and prepped and draped in a sterile fashion.    One-percent lidocaine was infiltrated in the soft tissues, and access to the graft was obtained in an antegrade direction near the arterial anastomosis. MicroWire followed by the Microsheath,  J-wire followed by a 6-French sheath is inserted. KMP catheter, floppy Glidewire were negotiated into the central venous system, and hand-injection of contrast was used to demonstrate the central venous anatomy was widely patent. It also demonstrates thrombus within the distal portion of the graft, and 4000 units of heparin was given intravenously.   Trerotola device was then opened onto the field. A mechanical thrombectomy was performed at the venous outflow and venous half of the graft. After several passes, the majority of the thrombus had been adequately treated, and therefore 1% lidocaine was infiltrated in the soft tissue, and more proximally on the arm the graft was accessed in a retrograde direction, MicroWire followed by the Microsheath, J-wire followed by a 6-French sheath is inserted. Trerotola device was then advanced down into the brachial artery where it is opened, but not engaged. It was then pulled back into the graft where it is engaged, and multiple passes in the arterial portion of the graft were performed. Flow was then re-established to the graft itself. A strong pulsatility is noted. Another pass or two was made in the antegrade direction with the Trerotola.   A Magic Torque wire was then advanced in an antegrade direction through the venous stricture, and a 6 x 4 balloon was used to angioplasty the string sign at the venous anastomosis. A 30% to 40% residual stenosis was still identified, and therefore a 7 x 50 flared stent was opened onto the field. It was then advanced over the wire bare-back and deployed across the anastomosis.   A 7  x 4 balloon was then advanced across the lesion and used to post-dilate the stent, fully expanding it. Followup angiography demonstrated rapid flow of  contrast, with complete resolution of the stricture. The graft itself now has an excellent thrill. Pursestring sutures of  4-0 Monocryl are placed. It should be noted that there was a reflux image with the initial balloon inflation demonstrating wide patency of the arterial anastomosis.   INTERPRETATION: Initial views demonstrate central venous anatomy is widely patent. Thrombus is noted within the graft. Following mechanical thrombectomy, there was resolution, and the culprit lesion causing the thrombosis appears to be the venous outflow with a string sign. This does not dilate adequately initially, and therefore a flared stent was used to treat this stricture, postdilated to 7 mm, with an excellent result.   SUMMARY: Successful salvage right arm brachial axillary dialysis graft, as described above.    ____________________________ Katha Cabal, MD ggs:dm D: 04/21/2012 13:28:00 ET T: 04/21/2012 14:38:28 ET JOB#: [id number]  cc: Katha Cabal, MD, <Dictator> Katha Cabal MD ELECTRONICALLY SIGNED 04/22/2012 11:24

## 2014-06-18 NOTE — Op Note (Signed)
PATIENT NAME:  [name] Marks, Yasemin A MR#:  [id number] DATE OF BIRTH:  [birth year]  DATE OF PROCEDURE:  01/20/2013  PREOPERATIVE DIAGNOSES:  1.  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  2.  End-stage renal disease requiring hemodialysis.  3.  Superior vena cava syndrome.   POSTOPERATIVE DIAGNOSES: 1.  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  2.  End-stage renal disease requiring hemodialysis.  3.  Superior vena cava syndrome.   PROCEDURES PERFORMED:  1.  Contrast injection, right upper extremity brachial axillary dialysis graft.  2.  Mechanical thrombectomy using Trerotola device, right arm brachial axillary dialysis graft.  3.  Percutaneous transluminal angioplasty of the superior vena cava to 14 mm.  4.  Percutaneous transluminal angioplasty of the arterial portion to 8 mm of the arteriovenous graft.   SURGEON: Hortencia Pilar, M.D.   SEDATION: Versed 3 mg plus fentanyl 150 mcg administered IV. Continuous ECG, pulse oximetry and cardiopulmonary monitoring is performed throughout the entire procedure by the interventional radiology nurse. Total sedation time is 1 hour.   ACCESS:  1.  A 6-French sheath, antegrade direction, right arm brachial axillary dialysis graft.  2.  A 6-French sheath, retrograde direction, right arm brachial axillary dialysis graft.   CONTRAST USED: Isovue 50 mL.   FLUOROSCOPY TIME: 5.9 minutes.   INDICATIONS: [name] is a 59 year old woman, who presents with thrombosis of her AV graft. The risks and benefits for thrombectomy are reviewed and the patient has agreed to proceed.   DESCRIPTION OF PROCEDURE: The patient is taken to special procedures and placed in the supine position. After adequate sedation has been achieved, she is positioned with her right arm extended palm upward. The right arm is prepped and draped in a sterile fashion. Lidocaine 1%  infiltrated in the soft tissues overlying the palpable graft near the arterial  anastomosis and in an antegrade direction a micropuncture needle is used to access the graft, microwire followed by microsheath, J-wire followed by a 6-French sheath. Hand injection of contrast is used to demonstrate thrombus within the graft. Catheter and wire are negotiated into the central venous anatomy, which demonstrates a narrowing at the confluence of the innominate vein with the superior vena cava. Heparin 4000 units are given. The Trerotola device is opened on the field, and mechanical thrombectomy is performed of the venous portion as well as the previously placed more proximal stents.   Followup angiography demonstrates resolution of the thrombotic material, and, therefore, a second sheath is inserted more proximally on the arm in a retrograde direction into the brachial axillary dialysis graft. Trerotola device is negotiated out of the brachial artery. It is opened, but not deployed until the basket is within the graft itself. Multiple passes are made and there is now flow established through the graft.   A wire is then introduced through the antegrade sheath and the lesion previously noted in the superior vena cava is treated with a 14 mm balloon dilatation. Lesion is significantly improved following the inflation. KMP catheter and wire is then negotiated into the brachial artery, and hand injection of contrast is utilized to demonstrate the arterial portion of the graft, which demonstrates a greater than 80% stenosis associated with the antegrade sheath. No other abnormalities are noted throughout the graft. The central veins have been treated, and therefore wire is introduced through the retrograde sheath and an 8 x 4 balloon is advanced across this arterial lesion. As the balloon is being inflated, the antegrade sheath is removed  and a pursestring suture of Monocryl is placed at the puncture site. Inflation is to 12 atmospheres for 1 minute and subsequently followup angiography demonstrates  resolution of this lesion and a pursestring suture is placed around the retrograde sheath and this is removed. There are no immediate complications.   INTERPRETATION: Initial views demonstrate thrombus within the arteriovenous graft. There is a narrowing in the superior vena cava. There is also, once uncovered, a narrowing in the arterial portion. Following successful thrombectomy, both the central venous lesion is treated as well as the arterial lesion with a good result.   SUMMARY: Successful salvage of right arm brachial axillary dialysis graft as described above.  ____________________________ Katha Cabal, MD ggs:aw D: 01/21/2013 10:41:05 ET T: 01/21/2013 11:42:50 ET JOB#: [id number]  cc: Katha Cabal, MD, <Dictator> Katha Cabal MD ELECTRONICALLY SIGNED 02/02/2013 17:18

## 2014-06-18 NOTE — Op Note (Signed)
PATIENT NAME:  [name], [name] Marks MR#:  [id number] DATE OF BIRTH:  1955-03-21  DATE OF OPERATION:  12/15/2012  PREOPERATIVE DIAGNOSES: 1.  End-stage renal disease.  2.  Thrombosed right arm AV graft.  3.  Hypertension.   POSTOPERATIVE DIAGNOSES:  1.  End-stage renal disease.  2.  Thrombosed right arm AV graft.  3.  Hypertension.   PROCEDURES: 1. Ultrasound guidance for vascular access to right arm AV graft both in antegrade and retrograde fashion crossing.  2.  Catheter directed thrombolysis with 4 mg of t-PA to the graft.  3.  Mechanical thrombectomy and Fogarty embolectomy to the graft.  4.  PTA of the graft with Marks 7 mm diameter angioplasty balloon.  5. Re-stent to the venous anastomosis for residual thrombus after angioplasty with an 8 mm diameter Viabahn stent.   SURGEON:  Algernon Huxley, MD  ANESTHESIA:  Local with moderate conscious sedation.   BLOOD LOSS: Approximately 25 mL.   INDICATION FOR PROCEDURE: Marks 59 year old African-American female with end-stage renal disease. Her graft has clotted again, and we are attempting to salvage this.   DESCRIPTION OF PROCEDURE: The patient was brought to the vascular radiology suite. The right upper extremity was sterilely prepped and draped, and Marks sterile surgical field was created. The graft was accessed initially in Marks retrograde fashion with Marks micropuncture needle. Under direct ultrasound guidance, Marks micropuncture wire and sheath were then placed. Imaging showed Marks thrombosed graft. The graft was accessed in antegrade direction as well. The thrombosis was laced with 4 mg of t-PA, and Marks Fogarty embolectomy was performed for the arterial plug. The patient was systemically heparinized. Ultimately, there was still residual thrombus and flow limitation, and the arterial anastomosis was treated with Marks 7 mm diameter angioplasty balloon. There was Marks narrowed area in the midportion of the graft that was treated with Marks 7 mm diameter angioplasty balloon, and  the venous anastomosis, which had some thrombosis within the stent, was also treated with Marks 7 mm diameter angioplasty balloon. This largely resolved, with the exception of residual thrombus in the venous anastomotic stent. I re-treated this area with an 8 mm diameter x 15 cm Viabahn stent. I chased the thrombus down into the central venous circulation with the AngioJet as well. Now the graft had restored patency, with brisk flow, and I elected to terminate the procedure. The sheaths were removed with placement of  4-0 Monocryl pursestring sutures. Pressure was held. Sterile dressing was placed. The patient tolerated the procedure well, and was taken to the recovery room in stable condition.     ____________________________ Algernon Huxley, MD jsd:mr D: 12/17/2012 16:15:54 ET T: 12/17/2012 19:13:36 ET JOB#: [id number]  cc: Algernon Huxley, MD, <Dictator> Algernon Huxley MD ELECTRONICALLY SIGNED 12/24/2012 13:48

## 2014-06-19 NOTE — Op Note (Signed)
PATIENT NAME:  Marks, [name] A MR#:  [id number] DATE OF BIRTH:  04-14-1955  DATE OF PROCEDURE:  11/05/2013  PREOPERATIVE DIAGNOSES: 1.  End-stage renal disease.  2.  Poorly functioning right arm arteriovenous graft.  3.  Hypertension.   POSTOPERATIVE DIAGNOSES:  1.  End-stage renal disease.  2.  Poorly functioning right arm arteriovenous graft.  3.  Hypertension.   PROCEDURES: 1.  Ultrasound guidance for vascular access to right arm arteriovenous graft.  2.  Right upper extremity shuntogram and central venogram.  3.  Percutaneous transluminal angioplasty of mid graft/venous access site with 6 mm diameter drug-eluting angioplasty balloon and 7 mm diameter conventional angioplasty balloon.   SURGEON: Algernon Huxley, MD.   ANESTHESIA: Local with moderate conscious sedation.   ESTIMATED BLOOD LOSS: Minimal.   INDICATION FOR PROCEDURE: This is an individual well known to [place] for her dialysis access needs. Her graft has an area of significant stenosis on the noninvasive study and an area of difficulty access the venous access site. She is brought in for shuntogram for further evaluation and potential treatment. Risks and benefits were discussed. Informed consent was obtained.   DESCRIPTION OF PROCEDURE: The patient is brought to the vascular suite where extremity was sterilely prepped and draped and a sterile surgical field was created. It was accessed between the arterial access site and the arterial anastomosis under direct ultrasound guidance with a micropuncture needle and a permanent image was recorded. A micropuncture wire and sheath were then placed. We upsized to a 6 [place] sheath and gave the patient 3000 units of intravenous heparin. Imaging was performed. This showed high-grade stenosis in the mid graft to what appeared to be the venous access site. This was greater than 80%. The remainder of the graft was patent. There were veins in the venous anastomosis and into the outflow veins and  the right subclavian vein that were patent. The central venous circulation was patent. I crossed the lesion without difficulty with a Magic torque wire and treated it initially with a 6 mm diameter x 6 cm length with Lutonix drug-coated angioplasty balloon. With the balloon inflated, imaging was performed to opacify the arterial anastomosis which was widely patent. The balloon was deflated and imaging was performed. There was still some narrowing there, and so I elected to upsize to a 7 mm diameter angioplasty balloon in this location. Completion angiogram following this showed only about a 15% to 20% residual stenosis that was not limiting flow at all, and I elected to terminate the procedure. The sheath was removed and 4-0 Monocryl pursestring sutures were placed. Pressure was held. Sterile dressing was placed. The patient tolerated the procedure well and was taken to the recovery room in stable condition.    ____________________________ Algernon Huxley, MD jsd:at D: 11/05/2013 09:25:02 ET T: 11/05/2013 10:34:21 ET JOB#: [id number]  cc: Algernon Huxley, MD, <Dictator> Algernon Huxley MD ELECTRONICALLY SIGNED 11/09/2013 15:06

## 2014-06-19 NOTE — Op Note (Signed)
PATIENT NAME:  [name] Marks, [name] Marks MR#:  [id number] DATE OF BIRTH:  1955-06-15  DATE OF PROCEDURE:  02/13/2013  PREOPERATIVE DIAGNOSES: 1.  End-stage renal disease requiring hemodialysis.  2.  Thrombosis of dialysis access, right arm AV graft.  3.  Complication of dialysis device.  4.  Stricture of subclavian vein.   POSTOPERATIVE DIAGNOSES: 1.  End-stage renal disease requiring hemodialysis.  2.  Thrombosis of dialysis access, right arm AV graft.  3.  Complication of dialysis device.  4.  Stricture of subclavian vein.  PROCEDURES PERFORMED: 1.  Contrast injection, right arm brachial axillary dialysis graft.  2.  Mechanical thrombectomy, right arm brachial axillary dialysis device with Trerotola device.  3.  Percutaneous transluminal angioplasty to 7 mm, venous portion brachial axillary dialysis graft.  4.  Percutaneous transluminal angioplasty of the arterial portion of the right arm brachial axillary dialysis graft to 7 mm.  5.  Placement of Marks straight FLAIR stent for failed angioplasty, right arm brachial axillary graft arterial portion.   SURGEON:  Hortencia Pilar, M.D.  SEDATION:  Versed 3 mg plus fentanyl 150 mcg administered IV.  Continuous ECG, pulse oximetry and cardiopulmonary monitoring was performed throughout the entire procedure by the interventional radiology nurse.  Total sedation time was 1 hour, 20 minutes.   ACCESS:   1.  Marks 7 French sheath, retrograde direction, right arm brachial axillary dialysis graft.   2.  Marks 6 French sheath, antegrade direction, right arm brachial axillary dialysis graft.   CONTRAST USED:  Isovue 20 mL.   FLUOROSCOPY TIME:  4.8 minutes.   INDICATIONS:  [name] is Marks 59 year old woman maintained on hemodialysis via Marks right arm graft.  She presented to dialysis and was found to have thrombosis of her AV graft and is therefore undergoing attempts at graft salvage.  Risks and benefits were reviewed.  All questions answered.  The patient has agreed  to proceed.   DESCRIPTION OF PROCEDURE:  The patient is taken to special procedures and placed in the supine position.  After adequate sedation has been achieved, she is positioned supine with her right arm extended palm upward.  The right arm is prepped and draped in Marks sterile fashion.  Appropriate timeout is called.   The graft is palpated and 1% lidocaine is infiltrated in the soft tissues overlying the graft near the arterial anastomosis.  Access is obtained with Marks micropuncture needle without difficulty.  Microwire followed by micro sheath, J-wire followed by Marks 6 French sheath.  Hand injection of contrast demonstrates thrombus within the graft and no forward flow.   4000 units of heparin is given and Marks Trerotola device is opened onto the field.  Trerotola device is then advanced out into the right innominate vein where the basket is opened, but not engaged.  It is then pulled back into the stented segment and mechanical thrombectomy of the central venous stents and brachial axillary dialysis graft is then performed using the Trerotola device.  Multiple passes are made and follow-up angiography demonstrates Marks freedom of thrombus throughout the system.  It is then copiously irrigated with heparinized saline.   1% lidocaine is infiltrated in the soft tissues overlying the palpable graft at the level of the deltoid and in Marks retrograde direction, Marks microneedle was inserted, microwire followed by micro sheath, J-wire followed by Marks 6 French sheath.  Trerotola device is then advanced through the 6 French sheath out into the brachial artery where the basket is opened, but not engaged.  It is then slowly pulled back into the graft itself where multiple passes are then made in the arterial portion.  Follow-up angiography now demonstrates forward flow, but it is very sluggish suggesting Marks lesion within the arterial portion.   Marks floppy Glidewire and Marks KMP catheter are then negotiated into the proximal brachial  artery and hand injection of contrast at this level is then utilized to demonstrate the entire portion of the graft from the anastomosis onward.  At the level of the sheath there is Marks near occlusive lesion with the sheath present inside this lesion.  It is therefore necessary to exchange the Glidewire for Marks Magic torque wire and Marks 7 x 6 balloon is advanced across this lesion within the arterial portion of the AV graft.  The [employer] J-wire is reintroduced through the 6 French antegrade sheath and the sheath is removed while the balloon inflation is being performed.  Wire is left in place.  Follow-up angiography demonstrates greater than 60% residual stenosis and therefore an 8 x 40 Fluency is opened.  It is advanced over the wire through the retrograde puncture site and then deployed without difficulty.  Marks 7 French sheath is then inserted and the 7 x 6 balloon is once again introduced and used to post-dilate the stent.  Follow-up angiography now demonstrates complete resolution of the abnormalities within the arterial portion of the graft and rapid flow of contrast.   It should be noted that prior to placing the stent because there also was Marks lesion in the venous portion just above the puncture of the retrograde sheath, Marks Magic torque wire was introduced through the antegrade 6 French sheath and then the 7 x 6 balloon was introduced through that sheath and angioplasty was performed again removing the retrograde sheath during the angioplasty and then reintroducing it over the wire.  Follow-up angiography demonstrated complete resolution of the venous lesion after the angioplasty.  Having successfully treated the venous portion and the central portion not needing any further treatment, attention was turned to placing the stent which covered the antegrade sheath and therefore required removal of the sheath as the stent was being deployed.  Adequate seal was obtained with post dilatation.   Pursestring sutures of  4-0 Monocryl were placed at both sheath puncture sites and there were no immediate complications.   INTERPRETATION:  Initial views demonstrate thrombus.  There are previously placed central stents that extend from the venous anastomosis all the way to the proximal subclavian vein.  Mechanical thrombectomy was performed with the Trerotola both through the venous portion and the arterial portion without difficulty.  Lesion is noted in both the venous and the arterial portions.  Venous is adequately treated with angioplasty alone.  Arterial requires stent with postdilatation and then it is adequately treated.   SUMMARY:  Successful salvage of right arm brachial axillary dialysis graft.    ____________________________ Katha Cabal, MD ggs:ea D: 02/13/2013 15:34:22 ET T: 02/13/2013 23:40:26 ET JOB#: [id number]  cc: Katha Cabal, MD, <Dictator> Katha Cabal, MD Katha Cabal MD ELECTRONICALLY SIGNED 03/03/2013 19:29

## 2014-06-19 NOTE — Op Note (Signed)
PATIENT NAME:  [name] Marks, Shakora A MR#:  [id number] DATE OF BIRTH:  August 15, 1955  DATE OF PROCEDURE:  06/26/2013  PREOPERATIVE DIAGNOSES:  1.  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  2.  End-stage renal disease requiring hemodialysis.   POSTOPERATIVE DIAGNOSES:  1.  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  2.  End-stage renal disease requiring hemodialysis.    PROCEDURE PERFORMED:  1.  Contrast injection, right arm brachial axillary dialysis graft.  2.  Trerotola thrombectomy of right arm brachial axillary dialysis graft.  3.  Percutaneous transluminal angioplasty to 10 mm venous outflow.  4.  Percutaneous transluminal angioplasty to 6 mm arterial anastomosis.   SURGEON:  Katha Cabal, MD   SEDATION:  Versed plus fentanyl.   FLUOROSCOPY TIME:  9.8 minutes.   CONTRAST USED:  Isovue 70 mL.   INDICATIONS:  [name] is sent in from her dialysis center after it was determined she had thrombosed her brachial axillary dialysis graft. Risks and benefits were reviewed. The patient agrees to proceed.   DESCRIPTION OF PROCEDURE:  The patient was taken to special procedures and placed in the supine position with her right arm extended, palm upward, and right arm was prepped and draped in sterile fashion. Appropriate timeout was called. She was given conscious sedation.   One percent lidocaine was infiltrated in the soft tissues, and a micropuncture needle was inserted into the graft in an antegrade direction near the arterial anastomosis. Micro wire, followed by micro sheath, J-wire, followed by a 6-French sheath was inserted. KMP catheter and floppy Glidewire were then negotiated through the graft and into the central venous system where patency of the innominate and superior vena cava was verified. The catheter was pulled back into the stented venous outflow, and thrombus was also confirmed. Then, 4000 units of heparin were given and allowed to circulate, and  then the Trerotola device was utilized to perform thrombectomy. Multiple passes were made in the antegrade direction. Following which, there was resolution of much of the thrombus. Stenosis was noted in the venous portion. A micro needle was then inserted in a retrograde direction higher up on the arm at the level of the deltoid, and a sheath was placed as described above. Trerotola device was then used to make passes, and the arterial flow was now re-established, but it was significantly diminished.   Floppy Glidewires were then used to negotiate the KMP catheter into the proximal brachial artery, and patency of the brachial artery in the arterial portion was confirmed. Stenosis at the arterial was confirmed as well. Therefore, a Magic torque wire was advanced, and a 6 x 8 balloon was used to angioplasty this area. This resolved the stenosis on followup imaging. However, venous flow was now no longer present, and again Trerotola device was utilized. This did re-establish a channel, and then a 10 x 10 balloon was advanced, and serial angioplasty from the level of the innominate vein was performed. Two inflations were used of the 10 x 10, and then an 8 x 10 was utilized within the graft itself. Followup imaging demonstrated yet another area of residual thrombus, again treated with the Trerotola and then a balloon dilatation. Ultimately, with these multiple maneuvers, the graft was re-established as patent. There were no hemodynamically significant stenoses noted throughout its entire course. Pursestring sutures of 4-0 Monocryl were placed around both sheaths, and they were removed. There were no immediate complications.   INTERPRETATION: Initial views demonstrate thrombus throughout the entire  graft and venous outflow. This was treated as noted above with both angioplasty, as well as Trerotola device. Multiple lesions were treated including arterial and venous. Ultimately, the graft was salvaged.      ____________________________ Katha Cabal, MD ggs:ms D: 06/26/2013 19:42:29 ET T: 06/26/2013 21:13:48 ET JOB#: [id number]  cc: Katha Cabal, MD, <Dictator> Katha Cabal MD ELECTRONICALLY SIGNED 06/30/2013 10:06

## 2014-06-20 NOTE — Op Note (Signed)
PATIENT NAME:  [name] Marks, [name] Marks MR#:  [id number] DATE OF BIRTH:  [birth year]  DATE OF PROCEDURE:  05/28/2011  PREOPERATIVE DIAGNOSES:  1. End-stage renal disease.  2. Clotted left arm AV fistula.  POSTOPERATIVE DIAGNOSES:    1. End-stage renal disease.  2. Clotted left arm AV fistula.  PROCEDURES:   1. Ultrasound guidance for vascular access to the AV fistula in antegrade and retrograde fashion.  2. Left upper extremity fistulogram and central venogram.  3. Catheter directed thrombolysis with 4 mg of TPA with the AngioJet AVX catheter.  4. Mechanical rheolytic thrombectomy with the AngioJet AVX and DVX catheters.  5. Fogarty embolectomy with five Fogarty catheter for residual thrombosis.  6. Percutaneous transluminal angioplasty of arterial anastomosis and proximal fistula with a 5 mm diameter angioplasty balloon.  7. Percutaneous transluminal angioplasty of the fistula and the majority of the fistula in the previously placed stent with 8 and 10 mm diameter angioplasty balloons.   SURGEON: Algernon Huxley, M.D.   ANESTHESIA: Local with moderate conscious sedation.   ESTIMATED BLOOD LOSS: Approximately 25 mL.   INDICATION FOR PROCEDURE: This is a 59 year old African American female with end-stage renal disease and a clotted left arm AV fistula. All previous work has been done in [place]. She presents with a clotted fistula. We are attempting to open this. Risks and benefits were discussed. Informed consent was obtained.   DESCRIPTION OF PROCEDURE: The patient is brought to the vascular interventional radiology suite. The left upper extremity was sterilely prepped and draped and a sterile surgical field was created. The fistula initially could not be accessed in antegrade fashion due to the small thrombus nature of the fistula proximally. I accessed the fistula in the mid upper arm in a retrograde fashion through a previously placed stent and after getting a 6 French sheath in and lacing  the proximal portion of the fistula with 2 mg of TPA and ballooning, the arterial anastomosis and proximal fistula with a 5 mm, I was able to place a antegrade sheath in the proximal portion of the fistula. Through this antegrade sheath I laced the more distal portion of the fistula with two more milligrams of TPA. This was allowed to dwell for 15 minutes. Mechanical rheolytic thrombectomy was then performed from the arterial anastomosis to the central venous circulation. This uncovered a high-grade stenosis at the confluence of the subclavian vein just beyond the previously placed stent and this was treated with an 8 and then later a 10 mm diameter angioplasty balloon. After all this, there was still significant residual thrombosis and I upsized to the higher powered AngioJet catheter to help clear this, but again, residual thrombosis remained. From approximately 3 to 4 cm beyond the fistula anastomosis to the central venous circulation, I ballooned with an 8 mm diameter angioplasty balloon. Again, there was still residual thrombosis causing flow limitation in multiple areas and in these areas I upsized to a 10 mm diameter angioplasty balloon. At this point, the fistula was patent. There were still some areas of non-flow-limiting thrombosis within the stent portion of the AV fistula, but the fistula had a palpable soft thrill and markedly improved flow. At this point, we had essentially exhausted our percutaneous efforts. It should be noted that three passes with a 5 Fogarty embolectomy balloon were performed to help clear thrombosis within the fistula as well. The patient tolerated the procedure well and was taken to the recovery room in stable condition.    ____________________________ Algernon Huxley,  MD jsd:ap D: 05/28/2011 19:29:33 ET T: 05/29/2011 09:55:08 ET JOB#: [id number]  cc: Algernon Huxley, MD, <Dictator> Dr. Rolan Lipa, Nephrology, Andree Coss MD ELECTRONICALLY SIGNED 05/30/2011  15:04

## 2014-06-20 NOTE — Op Note (Signed)
PATIENT NAME:  [name] Marks, [name] Marks MR#:  [id number] DATE OF BIRTH:  12/21/1955  DATE OF PROCEDURE:  07/11/2011  PREOPERATIVE DIAGNOSIS: End-stage renal disease with multiple failed previous dialysis accesses.  POSTOPERATIVE DIAGNOSIS: End-stage renal disease with multiple failed previous dialysis accesses.  PROCEDURE: Right brachial artery axillary vein redo AV graft.   SURGEON: Algernon Huxley, MD   ANESTHESIA: General.  ESTIMATED BLOOD LOSS: 50 mL.   INDICATION FOR PROCEDURE: The patient is a 59 year old African American female with longstanding end-stage renal disease. She has multiple failed previous accesses. She is brought today for attempt at new access placement. Risks and benefits are discussed and informed consent is obtained.   DESCRIPTION OF PROCEDURE: The patient is brought to the operative suite and after an adequate level of general endotracheal anesthesia was obtained her right upper extremity was sterilely prepped and draped and a sterile surgical field was created. An antecubital incision was created. A previous vein mapping had shown a potentially usable cephalic vein on the right. On exploration this vein was small, thin walled, and not suitable for fistula creation. No other usable superficial vein remained. At this point she was going to need a prosthetic graft. She had already had a brachial artery to axillary vein AV graft and a forearm loop graft and I elected to dissect out the axillary artery more central to her previous graft. This was done tediously and with difficulty due to her very large body habitus and the depth of the wound. Once this was dissected out, the vein was prepared where it could be controlled with bulldog clamps. The brachial artery was dissected out quite tediously as well due to the multitude of scar tissue from her previous accesses but this artery was suitable for inflow for graft creation with good pulsatility. I tunneled from the axillary incision to the  antecubital incision, gave 3000 units of intravenous heparin for systemic anti-coagulation. The venous anastomosis was created first as this was the much more difficult of the two anastomoses due to the depth of the wound. This was done with a CV-6 suture and two 6-0 Prolene patch sutures were used for hemostasis. I then cut and beveled the graft to an appropriate length to match an arterial wall arteriotomy after control was pulled up on the brachial artery. Anastomosis was created with a running CV-6 suture in the usual fashion. The graft was flushed and de-aired prior to release of control. The wounds were then irrigated. Surgical and Evicyl topical hemostatic agents were placed and hemostasis was complete. I closed the wounds with 3-0 Vicryl and a 4-0 Vicryl and Dermabond was placed as a dressing. The patient tolerated the procedure well and was taken to the recovery room in stable condition.   ____________________________ Algernon Huxley, MD jsd:drc D: 07/11/2011 13:53:42 ET T: 07/11/2011 14:14:38 ET JOB#: [id number]  cc: Algernon Huxley, MD, <Dictator> Algernon Huxley MD ELECTRONICALLY SIGNED 07/11/2011 15:12

## 2014-06-20 NOTE — Op Note (Signed)
PATIENT NAME:  [name] Marks, [name] Marks MR#:  [id number] DATE OF BIRTH:  1956-02-24  DATE OF PROCEDURE:  08/20/2011  PREOPERATIVE DIAGNOSES:  1. End-stage renal disease with a functional permanent dialysis access.  2. Anemia of chronic disease.   POSTOPERATIVE DIAGNOSES:  1. End-stage renal disease with a functional permanent dialysis access.  2. Anemia of chronic disease.   PROCEDURE: Removal of left jugular PermCath.   SURGEONS: Algernon Huxley, MD and  Real Cons, PA-C  ANESTHESIA: Local.   ESTIMATED BLOOD LOSS: Minimal.   INDICATION FOR PROCEDURE: 59 year old African American female with end-stage renal disease. Her permanent dialysis access is now functional and her PermCath can be removed.   DESCRIPTION OF PROCEDURE: Patient is brought to the vascular interventional radiology area. Her neck, chest, and existing catheter were sterilely prepped and draped, a sterile surgical field was created. The area was anesthetized copiously with 1% lidocaine. Hemostats were used to help dissect out the cuff and the fibrous sheath surrounding the cuff was dissected free. The catheter was then removed in its entirety with gentle traction. Pressure was held. Sterile dressing was placed. Patient tolerated procedure well.   ____________________________ Algernon Huxley, MD jsd:cms D: 08/20/2011 08:33:46 ET T: 08/20/2011 09:39:00 ET JOB#: [id number]  cc: Algernon Huxley, MD, <Dictator> Algernon Huxley MD ELECTRONICALLY SIGNED 08/23/2011 9:22

## 2014-06-20 NOTE — Op Note (Signed)
PATIENT NAME:  Marks, [name] MR#:  [id number] DATE OF BIRTH:  02-13-1956  DATE OF PROCEDURE:  06/27/2011  PREOPERATIVE DIAGNOSES:  1. Endstage renal disease.  2. Thrombosed left brachial basilic AV fistula.   POSTOPERATIVE DIAGNOSES:  1. Endstage renal disease.  2. Thrombosed left brachial basilic AV fistula.   PROCEDURE: 1. Ultrasound guidance for vascular access, left brachial basilic AV fistula.  2. Left upper extremity fistulogram.   SURGEON: Algernon Huxley, M.D.   ANESTHESIA: Local with moderate conscious sedation.   ESTIMATED BLOOD LOSS: Minimal.   INDICATION FOR PROCEDURE: This is a 59 year old African American female with a thrombosed left brachial basilic AV fistula. She is brought in today for an attempt at salvage. This has been thrombosed for a week or so and is unlikely to be salvageable but she did request a last-ditch effort to try to restore flow in the fistula.  The risks and benefits were discussed.  Informed consent was obtained.  DESCRIPTION OF PROCEDURE: The patient was brought to the vascular interventional radiology suite. The left upper extremity was sterilely prepped and draped and a sterile surgical field was created. The thrombosed fistula was visualized under ultrasound. It was accessed with a micropuncture needle. Micropuncture wire and sheath were then placed. This did show a thrombosed fistula with a sclerotic thrombosed basilic vein. I placed a 6 French sheath and a stiff angled Glidewire, and despite the use of the Glidewire and the Kumpe catheter I was never able to cross the occluded fistula and gain access into the brachial artery from a retrograde fashion, and for that reason when it was clear this was going to be unsuccessful I elected to terminate the procedure.   The sheath was removed. Pressure was held. Sterile dressing was placed.   ____________________________ Algernon Huxley, MD jsd:bjt D: 06/27/2011 15:58:54 ET T: 06/27/2011 17:05:52  ET JOB#: [id number]  cc: Algernon Huxley, MD, <Dictator> Algernon Huxley MD ELECTRONICALLY SIGNED 06/28/2011 13:56

## 2014-06-20 NOTE — Op Note (Signed)
PATIENT NAME:  [name] Marks, [name] Marks MR#:  [id number] DATE OF BIRTH:  1956/01/13  DATE OF PROCEDURE:  05/29/2011  PREOPERATIVE DIAGNOSES:  1. Thrombosis of arm dialysis access.  2. Complication of dialysis device.  3. Endstage renal disease requiring hemodialysis.   POSTOPERATIVE DIAGNOSES:  1. Thrombosis of arm dialysis access.  2. Complication of dialysis device.  3. Endstage renal disease requiring hemodialysis.  4. 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   PROCEDURES PERFORMED:  1. Placement left IJ cuffed tunneled dialysis catheter with ultrasound guidance.  2. Introduction catheter right internal jugular vein with venography.   SURGEON: Katha Cabal, M.D.   SEDATION: Versed 6 mg plus fentanyl 300 mcg administered IV. Continuous ECG, pulse oximetry and cardiopulmonary monitoring was performed throughout the entire procedure by the interventional radiology nurse. Total sedation time was one hour and 30 minutes.   ACCESS:  1. 5 French sheath right jugular vein.  2. 12.5 French sheath left internal jugular vein.   CONTRAST USED: Isovue 5 mL.   FLUOROSCOPY TIME: 1.8 minutes.   INDICATIONS: [name] is a 59 year old woman who presented to the hospital yesterday with thrombosis of her AV graft. She underwent attempted thrombectomy with intervention; however, she was noted this morning to have rethrombosis and is therefore undergoing placement of a catheter while more suitable upper extremity access can be evaluated. The risks and benefits were reviewed. All questions are answered. The patient has agreed to proceed.   DESCRIPTION OF PROCEDURE: The patient is taken to Special Procedures and placed in the supine position. After adequate sedation is achieved, she is positioned with her neck rotated to the left and extended. Neck is prepped and draped in sterile fashion. Ultrasound is placed in a sterile sleeve. Ultrasound is utilized secondary to lack  of appropriate landmarks and to avoid vascular injury. Under direct ultrasound visualization the jugular vein is identified in the mid neck. It is difficult to localize more proximally; however, at this level it is accessed with a micropuncture needle. Microwire followed micro sheath is inserted. Microwire will not advance past the clavicle and therefore given the 5 French sheath hand injection of contrast is performed. This demonstrates there is occlusion of the jugular vein from the level of the mid neck down to the level of the clavicle. There are large collaterals and they do appear to reconstitute the very proximal jugular vein at the confluence. Based on the venography, it was hopeful that access could be obtained with ultrasound guidance behind the clavicular head. Attempts were made, but this was unsuccessful. Therefore, the sheath was removed, pressure was held, the patient was then repositioned, and the left neck was prepped and draped in sterile fashion. Ultrasound was placed in a sterile sleeve. Ultrasound again is utilized secondary to lack of appropriate landmarks. Under direct ultrasound visualization, the left jugular vein is identified. It is echolucent, homogeneous, and easily compressible indicating patency. Image is recorded and under direct continuous visualization, microneedle is inserted into the jugular vein. Microwire is advanced and under fluoroscopy confirmed to course through the superior vena cava into the inferior vena cava. Micro sheath is then inserted and the J-wire is then advanced and seated down into the inferior vena cava. A counterincision is created at the wire insertion site. A pocket is created with a hemostat and serial dilatation is performed. A peel-away sheath is then inserted. The wire is then woven through the [facility] 23-cm tip to cuff catheter and advanced over the  wire through the peel-away sheath. Wire and peel-away sheath are removed. Fluoroscopic guidance confirms  the catheter is in the atrium. The catheter is then approximated to the chest wall. Exit site is identified. Local is infiltrated. Counterincision is created. Tunneling device is passed and the catheter subsequently pulled subcutaneously. Hub assembly is connected. Under fluoroscopy the tips are positioned at the atriocaval junction. Both lumens aspirate and flush easily, packed with heparinized saline and subsequently packed with 5000 units of heparin per lumen. Neck counterincision is closed with 4-0 Monocryl subcuticular and Dermabond. The catheter is secured to the skin with 0 silk and a sterile dressing is applied. The patient tolerated the procedure well and there were no immediate complications. She is taken to recovery area in stable condition.    ____________________________ Katha Cabal, MD ggs:bjt D:  05/29/2011 17:47:47 ET          T: 05/30/2011 09:39:37 ET          JOB#: [id number] Dolores Lory Taziah Difatta MD ELECTRONICALLY SIGNED 05/30/2011 11:23

## 2014-06-21 DIAGNOSIS — D631 Anemia in chronic kidney disease: Secondary | ICD-10-CM | POA: Diagnosis not present

## 2014-06-21 DIAGNOSIS — D509 Iron deficiency anemia, unspecified: Secondary | ICD-10-CM | POA: Diagnosis not present

## 2014-06-21 DIAGNOSIS — N2581 Secondary hyperparathyroidism of renal origin: Secondary | ICD-10-CM | POA: Diagnosis not present

## 2014-06-21 DIAGNOSIS — N186 End stage renal disease: Secondary | ICD-10-CM | POA: Diagnosis not present

## 2014-06-22 DIAGNOSIS — Y841 Kidney dialysis as the cause of abnormal reaction of the patient, or of later complication, without mention of misadventure at the time of the procedure: Secondary | ICD-10-CM | POA: Diagnosis not present

## 2014-06-22 DIAGNOSIS — Z992 Dependence on renal dialysis: Secondary | ICD-10-CM | POA: Diagnosis not present

## 2014-06-22 DIAGNOSIS — I6529 Occlusion and stenosis of unspecified carotid artery: Secondary | ICD-10-CM | POA: Diagnosis not present

## 2014-06-22 DIAGNOSIS — E785 Hyperlipidemia, unspecified: Secondary | ICD-10-CM | POA: Diagnosis not present

## 2014-06-22 DIAGNOSIS — T859XXA Unspecified complication of internal prosthetic device, implant and graft, initial encounter: Secondary | ICD-10-CM | POA: Diagnosis not present

## 2014-06-22 DIAGNOSIS — N186 End stage renal disease: Secondary | ICD-10-CM | POA: Diagnosis not present

## 2014-06-22 DIAGNOSIS — I10 Essential (primary) hypertension: Secondary | ICD-10-CM | POA: Diagnosis not present

## 2014-06-23 DIAGNOSIS — D631 Anemia in chronic kidney disease: Secondary | ICD-10-CM | POA: Diagnosis not present

## 2014-06-23 DIAGNOSIS — N186 End stage renal disease: Secondary | ICD-10-CM | POA: Diagnosis not present

## 2014-06-23 DIAGNOSIS — D509 Iron deficiency anemia, unspecified: Secondary | ICD-10-CM | POA: Diagnosis not present

## 2014-06-23 DIAGNOSIS — N2581 Secondary hyperparathyroidism of renal origin: Secondary | ICD-10-CM | POA: Diagnosis not present

## 2014-06-25 DIAGNOSIS — D631 Anemia in chronic kidney disease: Secondary | ICD-10-CM | POA: Diagnosis not present

## 2014-06-25 DIAGNOSIS — N2581 Secondary hyperparathyroidism of renal origin: Secondary | ICD-10-CM | POA: Diagnosis not present

## 2014-06-25 DIAGNOSIS — D509 Iron deficiency anemia, unspecified: Secondary | ICD-10-CM | POA: Diagnosis not present

## 2014-06-25 DIAGNOSIS — N186 End stage renal disease: Secondary | ICD-10-CM | POA: Diagnosis not present

## 2014-06-26 DIAGNOSIS — Z992 Dependence on renal dialysis: Secondary | ICD-10-CM | POA: Diagnosis not present

## 2014-06-26 DIAGNOSIS — N039 Chronic nephritic syndrome with unspecified morphologic changes: Secondary | ICD-10-CM | POA: Diagnosis not present

## 2014-06-26 DIAGNOSIS — N186 End stage renal disease: Secondary | ICD-10-CM | POA: Diagnosis not present

## 2014-06-28 DIAGNOSIS — N186 End stage renal disease: Secondary | ICD-10-CM | POA: Diagnosis not present

## 2014-06-28 DIAGNOSIS — D509 Iron deficiency anemia, unspecified: Secondary | ICD-10-CM | POA: Diagnosis not present

## 2014-06-28 DIAGNOSIS — D631 Anemia in chronic kidney disease: Secondary | ICD-10-CM | POA: Diagnosis not present

## 2014-06-28 DIAGNOSIS — N2581 Secondary hyperparathyroidism of renal origin: Secondary | ICD-10-CM | POA: Diagnosis not present

## 2014-06-30 DIAGNOSIS — N2581 Secondary hyperparathyroidism of renal origin: Secondary | ICD-10-CM | POA: Diagnosis not present

## 2014-06-30 DIAGNOSIS — D631 Anemia in chronic kidney disease: Secondary | ICD-10-CM | POA: Diagnosis not present

## 2014-06-30 DIAGNOSIS — D509 Iron deficiency anemia, unspecified: Secondary | ICD-10-CM | POA: Diagnosis not present

## 2014-06-30 DIAGNOSIS — N186 End stage renal disease: Secondary | ICD-10-CM | POA: Diagnosis not present

## 2014-07-02 DIAGNOSIS — N186 End stage renal disease: Secondary | ICD-10-CM | POA: Diagnosis not present

## 2014-07-02 DIAGNOSIS — D509 Iron deficiency anemia, unspecified: Secondary | ICD-10-CM | POA: Diagnosis not present

## 2014-07-02 DIAGNOSIS — N2581 Secondary hyperparathyroidism of renal origin: Secondary | ICD-10-CM | POA: Diagnosis not present

## 2014-07-02 DIAGNOSIS — D631 Anemia in chronic kidney disease: Secondary | ICD-10-CM | POA: Diagnosis not present

## 2014-07-05 DIAGNOSIS — N2581 Secondary hyperparathyroidism of renal origin: Secondary | ICD-10-CM | POA: Diagnosis not present

## 2014-07-05 DIAGNOSIS — D509 Iron deficiency anemia, unspecified: Secondary | ICD-10-CM | POA: Diagnosis not present

## 2014-07-05 DIAGNOSIS — N186 End stage renal disease: Secondary | ICD-10-CM | POA: Diagnosis not present

## 2014-07-05 DIAGNOSIS — D631 Anemia in chronic kidney disease: Secondary | ICD-10-CM | POA: Diagnosis not present

## 2014-07-07 DIAGNOSIS — N2581 Secondary hyperparathyroidism of renal origin: Secondary | ICD-10-CM | POA: Diagnosis not present

## 2014-07-07 DIAGNOSIS — D631 Anemia in chronic kidney disease: Secondary | ICD-10-CM | POA: Diagnosis not present

## 2014-07-07 DIAGNOSIS — D509 Iron deficiency anemia, unspecified: Secondary | ICD-10-CM | POA: Diagnosis not present

## 2014-07-07 DIAGNOSIS — N186 End stage renal disease: Secondary | ICD-10-CM | POA: Diagnosis not present

## 2014-07-08 ENCOUNTER — Encounter (HOSPITAL_COMMUNITY): Payer: Self-pay | Admitting: Emergency Medicine

## 2014-07-08 ENCOUNTER — Emergency Department (INDEPENDENT_AMBULATORY_CARE_PROVIDER_SITE_OTHER)
Admission: EM | Admit: 2014-07-08 | Discharge: 2014-07-08 | Disposition: A | Discharge status: Home / Self Care | Payer: Medicare Other | Source: Home / Self Care | Attending: Family Medicine | Admitting: Family Medicine

## 2014-07-08 ENCOUNTER — Emergency Department (INDEPENDENT_AMBULATORY_CARE_PROVIDER_SITE_OTHER): Payer: Medicare Other

## 2014-07-08 DIAGNOSIS — J069 Acute upper respiratory infection, unspecified: Secondary | ICD-10-CM

## 2014-07-08 DIAGNOSIS — R509 Fever, unspecified: Secondary | ICD-10-CM | POA: Diagnosis not present

## 2014-07-08 DIAGNOSIS — B9789 Other viral agents as the cause of diseases classified elsewhere: Principal | ICD-10-CM

## 2014-07-08 DIAGNOSIS — R05 Cough: Secondary | ICD-10-CM | POA: Diagnosis not present

## 2014-07-08 MED ORDER — GUAIFENESIN-CODEINE 100-10 MG/5ML PO SOLN
5.0000 mL | Freq: Every evening | ORAL | Status: DC | PRN
Start: 2014-07-08 — End: 2015-05-20

## 2014-07-08 NOTE — ED Provider Notes (Signed)
[name] Marks is a 59 y.o. female who presents to Urgent Care today for cough fevers was in dizziness and nausea. Symptoms present for a few days. No vomiting or diarrhea chest pains or palpitations. Patient is currently on dialysis. She has chronic hypotension and and currently takes majoring. No wheezing or shortness of breath.   Past Medical History  Diagnosis Date  . End stage renal disease on dialysis   . Complication of anesthesia     due to kidney disease  . GERD (gastroesophageal reflux disease)    Past Surgical History  Procedure Laterality Date  . Dg av dialysis graft declot or    . Tubal ligation  1983  . Dilatation & curettage/hysteroscopy with trueclear N/A 11/06/2012    Procedure: DILATATION & CURETTAGE/HYSTEROSCOPY WITH TRUECLEAR;  Surgeon: Terrance Mass, MD;  Location: [facility];  Service: Gynecology;  Laterality: N/A;  Truclear Resectoscopic Polypectomy    History  Substance Use Topics  . Smoking status: Never Smoker   . Smokeless tobacco: Never Used  . Alcohol Use: No   ROS as above Medications: No current facility-administered medications for this encounter.   Current Outpatient Prescriptions  Medication Sig Dispense Refill  . albuterol (PROVENTIL HFA;VENTOLIN HFA) 108 (90 BASE) MCG/ACT inhaler Inhale 2 puffs into the lungs every 6 (six) hours as needed for wheezing.    . cetirizine (ZYRTEC) 10 MG tablet Take 1 tablet (10 mg total) by mouth daily. One tab daily for allergies 30 tablet 1  . cinacalcet (SENSIPAR) 60 MG tablet Take 60 mg by mouth daily.    . Famotidine (PEPCID PO) Take 1 tablet by mouth daily as needed (indigestion).     . fluticasone (FLONASE) 50 MCG/ACT nasal spray Place 1 spray into both nostrils 2 (two) times daily. 1 g 2  . guaiFENesin-codeine 100-10 MG/5ML syrup Take 5 mLs by mouth at bedtime as needed for cough. 120 mL 0  . ibuprofen (ADVIL,MOTRIN) 200 MG tablet Take 400 mg by mouth every 6 (six) hours as needed for pain.    Marland Kitchen ipratropium  (ATROVENT) 0.06 % nasal spray Place 2 sprays into both nostrils 4 (four) times daily. 15 mL 1  . midodrine (PROAMATINE) 10 MG tablet Take 10 mg by mouth daily.    . Multiple Vitamins-Minerals (PRORENAL VITAL) TABS Take 1 tablet by mouth daily.    . sevelamer (RENVELA) 800 MG tablet Take 1,600 mg by mouth 3 (three) times daily with meals.      Allergies  Allergen Reactions  . Contrast Media [Iodinated Diagnostic Agents] Anaphylaxis  . Ancef [Cefazolin Sodium] Nausea And Vomiting     Exam:  BP 86/55 mmHg  Pulse 74  Temp(Src) 98.5 F (36.9 C) (Oral)  Resp 20  SpO2 96% Gen: Well NAD HEENT: EOMI,  MMM Lungs: Normal work of breathing. CTABL Heart: RRR no MRG Abd: NABS, Soft. Nondistended, Nontender Exts: Brisk capillary refill, warm and well perfused.   No results found for this or any previous visit (from the past 24 hour(s)). Dg Chest 2 View  07/08/2014   CLINICAL DATA:  Sick for couple days, cough, chills, fever, history end-stage renal disease on dialysis  EXAM: CHEST  2 VIEW  COMPARISON:  10/09/2012  FINDINGS: Normal heart size, mediastinal contours, and pulmonary vascularity.  Lungs clear.  No pneumothorax.  Bones unremarkable.  Vascular stents identified at the RIGHT subclavian and BILATERAL axillary vessels extending into the upper arms.  IMPRESSION: No acute abnormalities.   Electronically Signed   By: Elta Guadeloupe  Thornton Papas M.D.   On: 07/08/2014 11:38    Assessment and Plan: 59 y.o. female with viral URI. Treat with symptomatic management including Tylenol and codeine cough syrup. Follow up with PCP.  Discussed warning signs or symptoms. Please see discharge instructions. Patient expresses understanding.     Gregor Hams, MD 07/08/14 [phone]

## 2014-07-08 NOTE — ED Notes (Signed)
C/o cold sx States she has been sweating, nausea, coughing and dizzy Tried sleep as tx no meds used Denies any sob or wheezing

## 2014-07-08 NOTE — Discharge Instructions (Signed)
Thank you for coming in today. Call or go to the emergency room if you get worse, have trouble breathing, have chest pains, or palpitations.    Upper Respiratory Infection, Adult An upper respiratory infection (URI) is also sometimes known as the common cold. The upper respiratory tract includes the nose, sinuses, throat, trachea, and bronchi. Bronchi are the airways leading to the lungs. Most people improve within 1 week, but symptoms can last up to 2 weeks. A residual cough may last even longer.  CAUSES Many different viruses can infect the tissues lining the upper respiratory tract. The tissues become irritated and inflamed and often become very moist. Mucus production is also common. A cold is contagious. You can easily spread the virus to others by oral contact. This includes kissing, sharing a glass, coughing, or sneezing. Touching your mouth or nose and then touching a surface, which is then touched by another person, can also spread the virus. SYMPTOMS  Symptoms typically develop 1 to 3 days after you come in contact with a cold virus. Symptoms vary from person to person. They may include:  Runny nose.  Sneezing.  Nasal congestion.  Sinus irritation.  Sore throat.  Loss of voice (laryngitis).  Cough.  Fatigue.  Muscle aches.  Loss of appetite.  Headache.  Low-grade fever. DIAGNOSIS  You might diagnose your own cold based on familiar symptoms, since most people get a cold 2 to 3 times a year. Your caregiver can confirm this based on your exam. Most importantly, your caregiver can check that your symptoms are not due to another disease such as strep throat, sinusitis, pneumonia, asthma, or epiglottitis. Blood tests, throat tests, and X-rays are not necessary to diagnose a common cold, but they may sometimes be helpful in excluding other more serious diseases. Your caregiver will decide if any further tests are required. RISKS AND COMPLICATIONS  You may be at risk for a more  severe case of the common cold if you smoke cigarettes, have chronic heart disease (such as heart failure) or lung disease (such as asthma), or if you have a weakened immune system. The very young and very old are also at risk for more serious infections. Bacterial sinusitis, middle ear infections, and bacterial pneumonia can complicate the common cold. The common cold can worsen asthma and chronic obstructive pulmonary disease (COPD). Sometimes, these complications can require emergency medical care and may be life-threatening. PREVENTION  The best way to protect against getting a cold is to practice good hygiene. Avoid oral or hand contact with people with cold symptoms. Wash your hands often if contact occurs. There is no clear evidence that vitamin C, vitamin E, echinacea, or exercise reduces the chance of developing a cold. However, it is always recommended to get plenty of rest and practice good nutrition. TREATMENT  Treatment is directed at relieving symptoms. There is no cure. Antibiotics are not effective, because the infection is caused by a virus, not by bacteria. Treatment may include:  Increased fluid intake. Sports drinks offer valuable electrolytes, sugars, and fluids.  Breathing heated mist or steam (vaporizer or shower).  Eating chicken soup or other clear broths, and maintaining good nutrition.  Getting plenty of rest.  Using gargles or lozenges for comfort.  Controlling fevers with ibuprofen or acetaminophen as directed by your caregiver.  Increasing usage of your inhaler if you have asthma. Zinc gel and zinc lozenges, taken in the first 24 hours of the common cold, can shorten the duration and lessen the severity  of symptoms. Pain medicines may help with fever, muscle aches, and throat pain. A variety of non-prescription medicines are available to treat congestion and runny nose. Your caregiver can make recommendations and may suggest nasal or lung inhalers for other symptoms.    HOME CARE INSTRUCTIONS   Only take over-the-counter or prescription medicines for pain, discomfort, or fever as directed by your caregiver.  Use a warm mist humidifier or inhale steam from a shower to increase air moisture. This may keep secretions moist and make it easier to breathe.  Drink enough water and fluids to keep your urine clear or pale yellow.  Rest as needed.  Return to work when your temperature has returned to normal or as your caregiver advises. You may need to stay home longer to avoid infecting others. You can also use a face mask and careful hand washing to prevent spread of the virus. SEEK MEDICAL CARE IF:   After the first few days, you feel you are getting worse rather than better.  You need your caregiver's advice about medicines to control symptoms.  You develop chills, worsening shortness of breath, or brown or red sputum. These may be signs of pneumonia.  You develop yellow or brown nasal discharge or pain in the face, especially when you bend forward. These may be signs of sinusitis.  You develop a fever, swollen neck glands, pain with swallowing, or white areas in the back of your throat. These may be signs of strep throat. SEEK IMMEDIATE MEDICAL CARE IF:   You have a fever.  You develop severe or persistent headache, ear pain, sinus pain, or chest pain.  You develop wheezing, a prolonged cough, cough up blood, or have a change in your usual mucus (if you have chronic lung disease).  You develop sore muscles or a stiff neck. Document Released: 08/08/2000 Document Revised: 05/07/2011 Document Reviewed: 05/20/2013 [facility] Patient Information 2015 [place], [place]. This information is not intended to replace advice given to you by your health care provider. Make sure you discuss any questions you have with your health care provider.

## 2014-07-09 DIAGNOSIS — N186 End stage renal disease: Secondary | ICD-10-CM | POA: Diagnosis not present

## 2014-07-09 DIAGNOSIS — D509 Iron deficiency anemia, unspecified: Secondary | ICD-10-CM | POA: Diagnosis not present

## 2014-07-09 DIAGNOSIS — N2581 Secondary hyperparathyroidism of renal origin: Secondary | ICD-10-CM | POA: Diagnosis not present

## 2014-07-09 DIAGNOSIS — D631 Anemia in chronic kidney disease: Secondary | ICD-10-CM | POA: Diagnosis not present

## 2014-07-12 DIAGNOSIS — N186 End stage renal disease: Secondary | ICD-10-CM | POA: Diagnosis not present

## 2014-07-12 DIAGNOSIS — D509 Iron deficiency anemia, unspecified: Secondary | ICD-10-CM | POA: Diagnosis not present

## 2014-07-12 DIAGNOSIS — N2581 Secondary hyperparathyroidism of renal origin: Secondary | ICD-10-CM | POA: Diagnosis not present

## 2014-07-12 DIAGNOSIS — D631 Anemia in chronic kidney disease: Secondary | ICD-10-CM | POA: Diagnosis not present

## 2014-07-14 DIAGNOSIS — N2581 Secondary hyperparathyroidism of renal origin: Secondary | ICD-10-CM | POA: Diagnosis not present

## 2014-07-14 DIAGNOSIS — N186 End stage renal disease: Secondary | ICD-10-CM | POA: Diagnosis not present

## 2014-07-14 DIAGNOSIS — D509 Iron deficiency anemia, unspecified: Secondary | ICD-10-CM | POA: Diagnosis not present

## 2014-07-14 DIAGNOSIS — D631 Anemia in chronic kidney disease: Secondary | ICD-10-CM | POA: Diagnosis not present

## 2014-07-16 DIAGNOSIS — N2581 Secondary hyperparathyroidism of renal origin: Secondary | ICD-10-CM | POA: Diagnosis not present

## 2014-07-16 DIAGNOSIS — D631 Anemia in chronic kidney disease: Secondary | ICD-10-CM | POA: Diagnosis not present

## 2014-07-16 DIAGNOSIS — N186 End stage renal disease: Secondary | ICD-10-CM | POA: Diagnosis not present

## 2014-07-16 DIAGNOSIS — D509 Iron deficiency anemia, unspecified: Secondary | ICD-10-CM | POA: Diagnosis not present

## 2014-07-19 DIAGNOSIS — N186 End stage renal disease: Secondary | ICD-10-CM | POA: Diagnosis not present

## 2014-07-19 DIAGNOSIS — D631 Anemia in chronic kidney disease: Secondary | ICD-10-CM | POA: Diagnosis not present

## 2014-07-19 DIAGNOSIS — N2581 Secondary hyperparathyroidism of renal origin: Secondary | ICD-10-CM | POA: Diagnosis not present

## 2014-07-19 DIAGNOSIS — D509 Iron deficiency anemia, unspecified: Secondary | ICD-10-CM | POA: Diagnosis not present

## 2014-07-21 DIAGNOSIS — N186 End stage renal disease: Secondary | ICD-10-CM | POA: Diagnosis not present

## 2014-07-21 DIAGNOSIS — D509 Iron deficiency anemia, unspecified: Secondary | ICD-10-CM | POA: Diagnosis not present

## 2014-07-21 DIAGNOSIS — N2581 Secondary hyperparathyroidism of renal origin: Secondary | ICD-10-CM | POA: Diagnosis not present

## 2014-07-21 DIAGNOSIS — D631 Anemia in chronic kidney disease: Secondary | ICD-10-CM | POA: Diagnosis not present

## 2014-07-23 DIAGNOSIS — N186 End stage renal disease: Secondary | ICD-10-CM | POA: Diagnosis not present

## 2014-07-23 DIAGNOSIS — D631 Anemia in chronic kidney disease: Secondary | ICD-10-CM | POA: Diagnosis not present

## 2014-07-23 DIAGNOSIS — D509 Iron deficiency anemia, unspecified: Secondary | ICD-10-CM | POA: Diagnosis not present

## 2014-07-23 DIAGNOSIS — N2581 Secondary hyperparathyroidism of renal origin: Secondary | ICD-10-CM | POA: Diagnosis not present

## 2014-07-26 DIAGNOSIS — N2581 Secondary hyperparathyroidism of renal origin: Secondary | ICD-10-CM | POA: Diagnosis not present

## 2014-07-26 DIAGNOSIS — N186 End stage renal disease: Secondary | ICD-10-CM | POA: Diagnosis not present

## 2014-07-26 DIAGNOSIS — D509 Iron deficiency anemia, unspecified: Secondary | ICD-10-CM | POA: Diagnosis not present

## 2014-07-26 DIAGNOSIS — D631 Anemia in chronic kidney disease: Secondary | ICD-10-CM | POA: Diagnosis not present

## 2014-07-27 DIAGNOSIS — N039 Chronic nephritic syndrome with unspecified morphologic changes: Secondary | ICD-10-CM | POA: Diagnosis not present

## 2014-07-27 DIAGNOSIS — N186 End stage renal disease: Secondary | ICD-10-CM | POA: Diagnosis not present

## 2014-07-27 DIAGNOSIS — Z992 Dependence on renal dialysis: Secondary | ICD-10-CM | POA: Diagnosis not present

## 2014-07-28 DIAGNOSIS — N186 End stage renal disease: Secondary | ICD-10-CM | POA: Diagnosis not present

## 2014-07-28 DIAGNOSIS — D509 Iron deficiency anemia, unspecified: Secondary | ICD-10-CM | POA: Diagnosis not present

## 2014-07-28 DIAGNOSIS — N2581 Secondary hyperparathyroidism of renal origin: Secondary | ICD-10-CM | POA: Diagnosis not present

## 2014-07-28 DIAGNOSIS — D631 Anemia in chronic kidney disease: Secondary | ICD-10-CM | POA: Diagnosis not present

## 2014-07-30 DIAGNOSIS — N2581 Secondary hyperparathyroidism of renal origin: Secondary | ICD-10-CM | POA: Diagnosis not present

## 2014-07-30 DIAGNOSIS — N186 End stage renal disease: Secondary | ICD-10-CM | POA: Diagnosis not present

## 2014-07-30 DIAGNOSIS — D631 Anemia in chronic kidney disease: Secondary | ICD-10-CM | POA: Diagnosis not present

## 2014-07-30 DIAGNOSIS — D509 Iron deficiency anemia, unspecified: Secondary | ICD-10-CM | POA: Diagnosis not present

## 2014-08-02 DIAGNOSIS — D509 Iron deficiency anemia, unspecified: Secondary | ICD-10-CM | POA: Diagnosis not present

## 2014-08-02 DIAGNOSIS — N2581 Secondary hyperparathyroidism of renal origin: Secondary | ICD-10-CM | POA: Diagnosis not present

## 2014-08-02 DIAGNOSIS — D631 Anemia in chronic kidney disease: Secondary | ICD-10-CM | POA: Diagnosis not present

## 2014-08-02 DIAGNOSIS — N186 End stage renal disease: Secondary | ICD-10-CM | POA: Diagnosis not present

## 2014-08-04 DIAGNOSIS — N2581 Secondary hyperparathyroidism of renal origin: Secondary | ICD-10-CM | POA: Diagnosis not present

## 2014-08-04 DIAGNOSIS — D509 Iron deficiency anemia, unspecified: Secondary | ICD-10-CM | POA: Diagnosis not present

## 2014-08-04 DIAGNOSIS — N186 End stage renal disease: Secondary | ICD-10-CM | POA: Diagnosis not present

## 2014-08-04 DIAGNOSIS — D631 Anemia in chronic kidney disease: Secondary | ICD-10-CM | POA: Diagnosis not present

## 2014-08-06 DIAGNOSIS — D631 Anemia in chronic kidney disease: Secondary | ICD-10-CM | POA: Diagnosis not present

## 2014-08-06 DIAGNOSIS — N2581 Secondary hyperparathyroidism of renal origin: Secondary | ICD-10-CM | POA: Diagnosis not present

## 2014-08-06 DIAGNOSIS — N186 End stage renal disease: Secondary | ICD-10-CM | POA: Diagnosis not present

## 2014-08-06 DIAGNOSIS — D509 Iron deficiency anemia, unspecified: Secondary | ICD-10-CM | POA: Diagnosis not present

## 2014-08-09 DIAGNOSIS — N186 End stage renal disease: Secondary | ICD-10-CM | POA: Diagnosis not present

## 2014-08-09 DIAGNOSIS — N2581 Secondary hyperparathyroidism of renal origin: Secondary | ICD-10-CM | POA: Diagnosis not present

## 2014-08-09 DIAGNOSIS — D509 Iron deficiency anemia, unspecified: Secondary | ICD-10-CM | POA: Diagnosis not present

## 2014-08-09 DIAGNOSIS — D631 Anemia in chronic kidney disease: Secondary | ICD-10-CM | POA: Diagnosis not present

## 2014-08-11 DIAGNOSIS — D509 Iron deficiency anemia, unspecified: Secondary | ICD-10-CM | POA: Diagnosis not present

## 2014-08-11 DIAGNOSIS — N186 End stage renal disease: Secondary | ICD-10-CM | POA: Diagnosis not present

## 2014-08-11 DIAGNOSIS — D631 Anemia in chronic kidney disease: Secondary | ICD-10-CM | POA: Diagnosis not present

## 2014-08-11 DIAGNOSIS — N2581 Secondary hyperparathyroidism of renal origin: Secondary | ICD-10-CM | POA: Diagnosis not present

## 2014-08-13 DIAGNOSIS — N2581 Secondary hyperparathyroidism of renal origin: Secondary | ICD-10-CM | POA: Diagnosis not present

## 2014-08-13 DIAGNOSIS — D631 Anemia in chronic kidney disease: Secondary | ICD-10-CM | POA: Diagnosis not present

## 2014-08-13 DIAGNOSIS — N186 End stage renal disease: Secondary | ICD-10-CM | POA: Diagnosis not present

## 2014-08-13 DIAGNOSIS — D509 Iron deficiency anemia, unspecified: Secondary | ICD-10-CM | POA: Diagnosis not present

## 2014-08-16 DIAGNOSIS — D509 Iron deficiency anemia, unspecified: Secondary | ICD-10-CM | POA: Diagnosis not present

## 2014-08-16 DIAGNOSIS — N2581 Secondary hyperparathyroidism of renal origin: Secondary | ICD-10-CM | POA: Diagnosis not present

## 2014-08-16 DIAGNOSIS — D631 Anemia in chronic kidney disease: Secondary | ICD-10-CM | POA: Diagnosis not present

## 2014-08-16 DIAGNOSIS — N186 End stage renal disease: Secondary | ICD-10-CM | POA: Diagnosis not present

## 2014-08-18 DIAGNOSIS — N186 End stage renal disease: Secondary | ICD-10-CM | POA: Diagnosis not present

## 2014-08-18 DIAGNOSIS — N2581 Secondary hyperparathyroidism of renal origin: Secondary | ICD-10-CM | POA: Diagnosis not present

## 2014-08-18 DIAGNOSIS — D631 Anemia in chronic kidney disease: Secondary | ICD-10-CM | POA: Diagnosis not present

## 2014-08-18 DIAGNOSIS — D509 Iron deficiency anemia, unspecified: Secondary | ICD-10-CM | POA: Diagnosis not present

## 2014-08-20 DIAGNOSIS — N2581 Secondary hyperparathyroidism of renal origin: Secondary | ICD-10-CM | POA: Diagnosis not present

## 2014-08-20 DIAGNOSIS — N186 End stage renal disease: Secondary | ICD-10-CM | POA: Diagnosis not present

## 2014-08-20 DIAGNOSIS — D631 Anemia in chronic kidney disease: Secondary | ICD-10-CM | POA: Diagnosis not present

## 2014-08-20 DIAGNOSIS — D509 Iron deficiency anemia, unspecified: Secondary | ICD-10-CM | POA: Diagnosis not present

## 2014-08-23 DIAGNOSIS — D631 Anemia in chronic kidney disease: Secondary | ICD-10-CM | POA: Diagnosis not present

## 2014-08-23 DIAGNOSIS — N2581 Secondary hyperparathyroidism of renal origin: Secondary | ICD-10-CM | POA: Diagnosis not present

## 2014-08-23 DIAGNOSIS — N186 End stage renal disease: Secondary | ICD-10-CM | POA: Diagnosis not present

## 2014-08-23 DIAGNOSIS — D509 Iron deficiency anemia, unspecified: Secondary | ICD-10-CM | POA: Diagnosis not present

## 2014-08-25 DIAGNOSIS — D509 Iron deficiency anemia, unspecified: Secondary | ICD-10-CM | POA: Diagnosis not present

## 2014-08-25 DIAGNOSIS — D631 Anemia in chronic kidney disease: Secondary | ICD-10-CM | POA: Diagnosis not present

## 2014-08-25 DIAGNOSIS — N2581 Secondary hyperparathyroidism of renal origin: Secondary | ICD-10-CM | POA: Diagnosis not present

## 2014-08-25 DIAGNOSIS — N186 End stage renal disease: Secondary | ICD-10-CM | POA: Diagnosis not present

## 2014-08-26 DIAGNOSIS — Z992 Dependence on renal dialysis: Secondary | ICD-10-CM | POA: Diagnosis not present

## 2014-08-26 DIAGNOSIS — N039 Chronic nephritic syndrome with unspecified morphologic changes: Secondary | ICD-10-CM | POA: Diagnosis not present

## 2014-08-26 DIAGNOSIS — N186 End stage renal disease: Secondary | ICD-10-CM | POA: Diagnosis not present

## 2014-08-27 DIAGNOSIS — N2581 Secondary hyperparathyroidism of renal origin: Secondary | ICD-10-CM | POA: Diagnosis not present

## 2014-08-27 DIAGNOSIS — D509 Iron deficiency anemia, unspecified: Secondary | ICD-10-CM | POA: Diagnosis not present

## 2014-08-27 DIAGNOSIS — D631 Anemia in chronic kidney disease: Secondary | ICD-10-CM | POA: Diagnosis not present

## 2014-08-27 DIAGNOSIS — N186 End stage renal disease: Secondary | ICD-10-CM | POA: Diagnosis not present

## 2014-08-30 DIAGNOSIS — D631 Anemia in chronic kidney disease: Secondary | ICD-10-CM | POA: Diagnosis not present

## 2014-08-30 DIAGNOSIS — N186 End stage renal disease: Secondary | ICD-10-CM | POA: Diagnosis not present

## 2014-08-30 DIAGNOSIS — N2581 Secondary hyperparathyroidism of renal origin: Secondary | ICD-10-CM | POA: Diagnosis not present

## 2014-08-30 DIAGNOSIS — D509 Iron deficiency anemia, unspecified: Secondary | ICD-10-CM | POA: Diagnosis not present

## 2014-09-01 DIAGNOSIS — D631 Anemia in chronic kidney disease: Secondary | ICD-10-CM | POA: Diagnosis not present

## 2014-09-01 DIAGNOSIS — N186 End stage renal disease: Secondary | ICD-10-CM | POA: Diagnosis not present

## 2014-09-01 DIAGNOSIS — D509 Iron deficiency anemia, unspecified: Secondary | ICD-10-CM | POA: Diagnosis not present

## 2014-09-01 DIAGNOSIS — N2581 Secondary hyperparathyroidism of renal origin: Secondary | ICD-10-CM | POA: Diagnosis not present

## 2014-09-03 DIAGNOSIS — D631 Anemia in chronic kidney disease: Secondary | ICD-10-CM | POA: Diagnosis not present

## 2014-09-03 DIAGNOSIS — D509 Iron deficiency anemia, unspecified: Secondary | ICD-10-CM | POA: Diagnosis not present

## 2014-09-03 DIAGNOSIS — N186 End stage renal disease: Secondary | ICD-10-CM | POA: Diagnosis not present

## 2014-09-03 DIAGNOSIS — N2581 Secondary hyperparathyroidism of renal origin: Secondary | ICD-10-CM | POA: Diagnosis not present

## 2014-09-06 DIAGNOSIS — N2581 Secondary hyperparathyroidism of renal origin: Secondary | ICD-10-CM | POA: Diagnosis not present

## 2014-09-06 DIAGNOSIS — N186 End stage renal disease: Secondary | ICD-10-CM | POA: Diagnosis not present

## 2014-09-06 DIAGNOSIS — D631 Anemia in chronic kidney disease: Secondary | ICD-10-CM | POA: Diagnosis not present

## 2014-09-06 DIAGNOSIS — D509 Iron deficiency anemia, unspecified: Secondary | ICD-10-CM | POA: Diagnosis not present

## 2014-09-08 DIAGNOSIS — N2581 Secondary hyperparathyroidism of renal origin: Secondary | ICD-10-CM | POA: Diagnosis not present

## 2014-09-08 DIAGNOSIS — D509 Iron deficiency anemia, unspecified: Secondary | ICD-10-CM | POA: Diagnosis not present

## 2014-09-08 DIAGNOSIS — N186 End stage renal disease: Secondary | ICD-10-CM | POA: Diagnosis not present

## 2014-09-08 DIAGNOSIS — D631 Anemia in chronic kidney disease: Secondary | ICD-10-CM | POA: Diagnosis not present

## 2014-09-09 ENCOUNTER — Encounter: Payer: Self-pay | Admitting: Gynecology

## 2014-09-09 ENCOUNTER — Other Ambulatory Visit (HOSPITAL_COMMUNITY)
Admission: RE | Admit: 2014-09-09 | Discharge: 2014-09-09 | Disposition: A | Discharge status: Home / Self Care | Payer: Medicare Other | Source: Ambulatory Visit | Attending: Gynecology | Admitting: Gynecology

## 2014-09-09 ENCOUNTER — Ambulatory Visit (INDEPENDENT_AMBULATORY_CARE_PROVIDER_SITE_OTHER): Payer: Medicare Other | Admitting: Gynecology

## 2014-09-09 VITALS — BP 124/76 | Wt 222.0 lb

## 2014-09-09 DIAGNOSIS — N840 Polyp of corpus uteri: Secondary | ICD-10-CM | POA: Insufficient documentation

## 2014-09-09 DIAGNOSIS — Z124 Encounter for screening for malignant neoplasm of cervix: Secondary | ICD-10-CM | POA: Diagnosis not present

## 2014-09-09 DIAGNOSIS — B373 Candidiasis of vulva and vagina: Secondary | ICD-10-CM | POA: Diagnosis not present

## 2014-09-09 DIAGNOSIS — Y841 Kidney dialysis as the cause of abnormal reaction of the patient, or of later complication, without mention of misadventure at the time of the procedure: Secondary | ICD-10-CM | POA: Diagnosis not present

## 2014-09-09 DIAGNOSIS — B3731 Acute candidiasis of vulva and vagina: Secondary | ICD-10-CM

## 2014-09-09 DIAGNOSIS — Z992 Dependence on renal dialysis: Secondary | ICD-10-CM | POA: Diagnosis not present

## 2014-09-09 DIAGNOSIS — N186 End stage renal disease: Secondary | ICD-10-CM | POA: Diagnosis not present

## 2014-09-09 DIAGNOSIS — I10 Essential (primary) hypertension: Secondary | ICD-10-CM | POA: Diagnosis not present

## 2014-09-09 DIAGNOSIS — Z800 Family history of malignant neoplasm of digestive organs: Secondary | ICD-10-CM | POA: Diagnosis not present

## 2014-09-09 DIAGNOSIS — N898 Other specified noninflammatory disorders of vagina: Secondary | ICD-10-CM | POA: Diagnosis not present

## 2014-09-09 DIAGNOSIS — B9689 Other specified bacterial agents as the cause of diseases classified elsewhere: Secondary | ICD-10-CM

## 2014-09-09 DIAGNOSIS — D251 Intramural leiomyoma of uterus: Secondary | ICD-10-CM

## 2014-09-09 DIAGNOSIS — Z01419 Encounter for gynecological examination (general) (routine) without abnormal findings: Secondary | ICD-10-CM | POA: Diagnosis not present

## 2014-09-09 DIAGNOSIS — N760 Acute vaginitis: Secondary | ICD-10-CM

## 2014-09-09 DIAGNOSIS — T859XXA Unspecified complication of internal prosthetic device, implant and graft, initial encounter: Secondary | ICD-10-CM | POA: Diagnosis not present

## 2014-09-09 DIAGNOSIS — Z1151 Encounter for screening for human papillomavirus (HPV): Secondary | ICD-10-CM | POA: Diagnosis not present

## 2014-09-09 DIAGNOSIS — A499 Bacterial infection, unspecified: Secondary | ICD-10-CM | POA: Diagnosis not present

## 2014-09-09 DIAGNOSIS — I6529 Occlusion and stenosis of unspecified carotid artery: Secondary | ICD-10-CM | POA: Diagnosis not present

## 2014-09-09 DIAGNOSIS — E785 Hyperlipidemia, unspecified: Secondary | ICD-10-CM | POA: Diagnosis not present

## 2014-09-09 LAB — WET PREP FOR TRICH, YEAST, CLUE: Trich, Wet Prep: NONE SEEN

## 2014-09-09 MED ORDER — TINIDAZOLE 500 MG PO TABS: ORAL_TABLET | ORAL | Status: DC

## 2014-09-09 MED ORDER — TERCONAZOLE 0.4 % VA CREA: 1.0000 | TOPICAL_CREAM | Freq: Every day | VAGINAL | Status: DC

## 2014-09-09 NOTE — Progress Notes (Signed)
[name] Marks 30-Apr-1955 [id number]   History:    59 y.o.  for annual gyn exam with a complaint of several months of a vaginal discharge and vulvar pruritus. Patient not sexually active. Patient has not been seen the office since her postop visit in 2014 after having had resectoscopic polypectomy as a result of postmenopausal bleeding. The pathology report demonstrated benign endometrial polyp. She denies any vaginal bleeding today. Patient with no past history of abnormal Pap smears.Patient has history of end-stage renal disease and goes to dialysis 3 times a week.patient has history of fibroids patient had a colonoscopy approximate 5 years ago benign polyps were removed. Patient's father had history of colon cancer. Patient has not had a bone density study yet. Her PCP has been doing her blood work as well. Patient with no prior history of abnormal Pap smear. Her Tdap vaccine is up-to-date.  Past medical history,surgical history, family history and social history were all reviewed and documented in the EPIC chart.  Gynecologic History No LMP recorded. Patient is postmenopausal. Contraception: post menopausal status Last Pap: 2013. Results were: normal Last mammogram: 2016. Results were: normal  Obstetric History OB History  Gravida Para Term Preterm AB SAB TAB Ectopic Multiple Living  2 2        2     # Outcome Date GA Lbr Len/2nd Weight Sex Delivery Anes PTL Lv  2 Para           1 Para                ROS: A ROS was performed and pertinent positives and negatives are included in the history.  GENERAL: No fevers or chills. HEENT: No change in vision, no earache, sore throat or sinus congestion. NECK: No pain or stiffness. CARDIOVASCULAR: No chest pain or pressure. No palpitations. PULMONARY: No shortness of breath, cough or wheeze. GASTROINTESTINAL: No abdominal pain, nausea, vomiting or diarrhea, melena or bright red blood per rectum. GENITOURINARY: Vaginal discharge with vulvar  pruritus MUSCULOSKELETAL: No joint or muscle pain, no back pain, no recent trauma. DERMATOLOGIC: No rash, no itching, no lesions. ENDOCRINE: No polyuria, polydipsia, no heat or cold intolerance. No recent change in weight. HEMATOLOGICAL: No anemia or easy bruising or bleeding. NEUROLOGIC: No headache, seizures, numbness, tingling or weakness. PSYCHIATRIC: No depression, no loss of interest in normal activity or change in sleep pattern.     Exam: chaperone present  BP 124/76 mmHg  Wt 222 lb (100.699 kg)  Body mass index is 40.59 kg/(m^2).  General appearance : Well developed well nourished female. No acute distress HEENT: Eyes: no retinal hemorrhage or exudates,  Neck supple, trachea midline, no carotid bruits, no thyroidmegaly Lungs: Clear to auscultation, no rhonchi or wheezes, or rib retractions  Heart: Regular rate and rhythm, no murmurs or gallops Breast:Examined in sitting and supine position were symmetrical in appearance, no palpable masses or tenderness,  no skin retraction, no nipple inversion, no nipple discharge, no skin discoloration, no axillary or supraclavicular lymphadenopathy Abdomen: no palpable masses or tenderness, no rebound or guarding Extremities: no edema or skin discoloration or tenderness  Pelvic:  Bartholin, Urethra, Skene Glands: Within normal limits             Vagina: No gross lesions or discharge, thick white discharge  Cervix: No gross lesions or discharge  Uterus  anteverted, normal size, shape and consistency, non-tender and mobile  Adnexa  Without masses or tenderness  Anus and perineum  normal   Rectovaginal  normal sphincter tone without palpated masses or tenderness             Hemoccult PCP provides   Wet prep: Yeast Positive amine, moderate clue cells, too numerous to count white blood cell, too numerous to count bacteria  Assessment/Plan:  59 y.o. female for annual exam with clinical evidence of yeast vaginitis and bacterial vaginosis. She will  be prescribed Terazol vaginal cream to apply daily at bedtime for one week for a yeast infection. For her bacterial vaginosis she will be prescribed Tindamax 500 mg tablet for which she will take 4 tabs today and repeat in 24 hours. Pap smear was done today. PCP we'll be doing her blood work. She was reminded on following up with her gastroenterologist since she is due for colonoscopy this year based on her past history of benign colon polyps had been removed and her father's history of colon cancer.   Terrance Mass MD, 12:19 PM 09/09/2014

## 2014-09-09 NOTE — Addendum Note (Signed)
Addended by: Burnett Kanaris on: 09/09/2014 02:06 PM   Modules accepted: Orders

## 2014-09-09 NOTE — Patient Instructions (Addendum)
Monilial Vaginitis Vaginitis in a soreness, swelling and redness (inflammation) of the vagina and vulva. Monilial vaginitis is not a sexually transmitted infection. CAUSES  Yeast vaginitis is caused by yeast (candida) that is normally found in your vagina. With a yeast infection, the candida has overgrown in number to a point that upsets the chemical balance. SYMPTOMS   White, thick vaginal discharge.  Swelling, itching, redness and irritation of the vagina and possibly the lips of the vagina (vulva).  Burning or painful urination.  Painful intercourse. DIAGNOSIS  Things that may contribute to monilial vaginitis are:  Postmenopausal and virginal states.  Pregnancy.  Infections.  Being tired, sick or stressed, especially if you had monilial vaginitis in the past.  Diabetes. Good control will help lower the chance.  Birth control pills.  Tight fitting garments.  Using bubble bath, feminine sprays, douches or deodorant tampons.  Taking certain medications that kill germs (antibiotics).  Sporadic recurrence can occur if you become ill. TREATMENT  Your caregiver will give you medication.  There are several kinds of anti monilial vaginal creams and suppositories specific for monilial vaginitis. For recurrent yeast infections, use a suppository or cream in the vagina 2 times a week, or as directed.  Anti-monilial or steroid cream for the itching or irritation of the vulva may also be used. Get your caregiver's permission.  Painting the vagina with methylene blue solution may help if the monilial cream does not work.  Eating yogurt may help prevent monilial vaginitis. HOME CARE INSTRUCTIONS   Finish all medication as prescribed.  Do not have sex until treatment is completed or after your caregiver tells you it is okay.  Take warm sitz baths.  Do not douche.  Do not use tampons, especially scented ones.  Wear cotton underwear.  Avoid tight pants and panty  hose.  Tell your sexual partner that you have a yeast infection. They should go to their caregiver if they have symptoms such as mild rash or itching.  Your sexual partner should be treated as well if your infection is difficult to eliminate.  Practice safer sex. Use condoms.  Some vaginal medications cause latex condoms to fail. Vaginal medications that harm condoms are:  Cleocin cream.  Butoconazole (Femstat).  Terconazole (Terazol) vaginal suppository.  Miconazole (Monistat) (may be purchased over the counter). SEEK MEDICAL CARE IF:   You have a temperature by mouth above 102 F (38.9 C).  The infection is getting worse after 2 days of treatment.  The infection is not getting better after 3 days of treatment.  You develop blisters in or around your vagina.  You develop vaginal bleeding, and it is not your menstrual period.  You have pain when you urinate.  You develop intestinal problems.  You have pain with sexual intercourse. Document Released: 11/22/2004 Document Revised: 05/07/2011 Document Reviewed: 08/06/2008 [facility] Patient Information 2015 [place], [place]. This information is not intended to replace advice given to you by your health care provider. Make sure you discuss any questions you have with your health care provider. Tinidazole tablets What is this medicine? TINIDAZOLE (tye NI da zole) is an antiinfective. It is used to treat amebiasis, giardiasis, trichomoniasis, and vaginosis. It will not work for colds, flu, or other viral infections. This medicine may be used for other purposes; ask your health care provider or pharmacist if you have questions. COMMON BRAND NAME(S): Tindamax What should I tell my health care provider before I take this medicine? They need to know if you have any  of these conditions: -anemia or other blood disorders -if you frequently drink alcohol containing drinks -receiving hemodialysis -seizure disorder -an unusual or  allergic reaction to tinidazole, other medicines, foods, dyes, or preservatives -pregnant or trying to get pregnant -breast-feeding How should I use this medicine? Take this medicine by mouth with a full glass of water. Follow the directions on the prescription label. Take with food. Take your medicine at regular intervals. Do not take your medicine more often than directed. Take all of your medicine as directed even if you think you are better. Do not skip doses or stop your medicine early. Talk to your pediatrician regarding the use of this medicine in children. While this drug may be prescribed for children as young as 59 years of age for selected conditions, precautions do apply. Overdosage: If you think you have taken too much of this medicine contact a poison control center or emergency room at once. NOTE: This medicine is only for you. Do not share this medicine with others. What if I miss a dose? If you miss a dose, take it as soon as you can. If it is almost time for your next dose, take only that dose. Do not take double or extra doses. What may interact with this medicine? Do not take this medicine with any of the following medications: -alcohol or any product that contains alcohol -amprenavir oral solution -disulfiram -paclitaxel injection -ritonavir oral solution -sertraline oral solution -sulfamethoxazole-trimethoprim injection This medicine may also interact with the following medications: -cholestyramine -cimetidine -conivaptan -cyclosporin -fluorouracil -fosphenytoin, phenytoin -ketoconazole -lithium -phenobarbital -tacrolimus -warfarin This list may not describe all possible interactions. Give your health care provider a list of all the medicines, herbs, non-prescription drugs, or dietary supplements you use. Also tell them if you smoke, drink alcohol, or use illegal drugs. Some items may interact with your medicine. What should I watch for while using this  medicine? Tell your doctor or health care professional if your symptoms do not improve or if they get worse. Avoid alcoholic drinks while you are taking this medicine and for three days afterward. Alcohol may make you feel dizzy, sick, or flushed. If you are being treated for a sexually transmitted disease, avoid sexual contact until you have finished your treatment. Your sexual partner may also need treatment. What side effects may I notice from receiving this medicine? Side effects that you should report to your doctor or health care professional as soon as possible: -allergic reactions like skin rash, itching or hives, swelling of the face, lips, or tongue -breathing problems -confusion, depression -dark or white patches in the mouth -feeling faint or lightheaded, falls -fever, infection -numbness, tingling, pain or weakness in the hands or feet -pain when passing urine -seizures -unusually weak or tired -vaginal irritation or discharge -vomiting Side effects that usually do not require medical attention (report to your doctor or health care professional if they continue or are bothersome): -dark brown or reddish urine -diarrhea -headache -loss of appetite -metallic taste -nausea -stomach upset This list may not describe all possible side effects. Call your doctor for medical advice about side effects. You may report side effects to FDA at 1-800-FDA-1088. Where should I keep my medicine? Keep out of the reach of children. Store at room temperature between 15 and 30 degrees C (59 and 86 degrees F). Protect from light and moisture. Keep container tightly closed. Throw away any unused medicine after the expiration date. NOTE: This sheet is a summary. It may not cover all  possible information. If you have questions about this medicine, talk to your doctor, pharmacist, or health care provider.  2015, Elsevier/Gold Standard. (2007-11-10 15:22:28) Bacterial Vaginosis Bacterial vaginosis  is a vaginal infection that occurs when the normal balance of bacteria in the vagina is disrupted. It results from an overgrowth of certain bacteria. This is the most common vaginal infection in women of childbearing age. Treatment is important to prevent complications, especially in pregnant women, as it can cause a premature delivery. CAUSES  Bacterial vaginosis is caused by an increase in harmful bacteria that are normally present in smaller amounts in the vagina. Several different kinds of bacteria can cause bacterial vaginosis. However, the reason that the condition develops is not fully understood. RISK FACTORS Certain activities or behaviors can put you at an increased risk of developing bacterial vaginosis, including:  Having a new sex partner or multiple sex partners.  Douching.  Using an intrauterine device (IUD) for contraception. Women do not get bacterial vaginosis from toilet seats, bedding, swimming pools, or contact with objects around them. SIGNS AND SYMPTOMS  Some women with bacterial vaginosis have no signs or symptoms. Common symptoms include:  Grey vaginal discharge.  A fishlike odor with discharge, especially after sexual intercourse.  Itching or burning of the vagina and vulva.  Burning or pain with urination. DIAGNOSIS  Your health care provider will take a medical history and examine the vagina for signs of bacterial vaginosis. A sample of vaginal fluid may be taken. Your health care provider will look at this sample under a microscope to check for bacteria and abnormal cells. A vaginal pH test may also be done.  TREATMENT  Bacterial vaginosis may be treated with antibiotic medicines. These may be given in the form of a pill or a vaginal cream. A second round of antibiotics may be prescribed if the condition comes back after treatment.  HOME CARE INSTRUCTIONS   Only take over-the-counter or prescription medicines as directed by your health care provider.  If  antibiotic medicine was prescribed, take it as directed. Make sure you finish it even if you start to feel better.  Do not have sex until treatment is completed.  Tell all sexual partners that you have a vaginal infection. They should see their health care provider and be treated if they have problems, such as a mild rash or itching.  Practice safe sex by using condoms and only having one sex partner. SEEK MEDICAL CARE IF:   Your symptoms are not improving after 3 days of treatment.  You have increased discharge or pain.  You have a fever. MAKE SURE YOU:   Understand these instructions.  Will watch your condition.  Will get help right away if you are not doing well or get worse. FOR MORE INFORMATION  Centers for Disease Control and Prevention, Division of STD Prevention: [employer]raiser[employer]raud[employer] American Sexual Health Association (ASHA): www.ashastd.org  Document Released: 02/12/2005 Document Revised: 12/03/2012 Document Reviewed: 09/24/2012 [facility] Patient Information 2015 [place], [place]. This information is not intended to replace advice given to you by your health care provider. Make sure you discuss any questions you have with your health care provider.

## 2014-09-10 DIAGNOSIS — D509 Iron deficiency anemia, unspecified: Secondary | ICD-10-CM | POA: Diagnosis not present

## 2014-09-10 DIAGNOSIS — N186 End stage renal disease: Secondary | ICD-10-CM | POA: Diagnosis not present

## 2014-09-10 DIAGNOSIS — N2581 Secondary hyperparathyroidism of renal origin: Secondary | ICD-10-CM | POA: Diagnosis not present

## 2014-09-10 DIAGNOSIS — D631 Anemia in chronic kidney disease: Secondary | ICD-10-CM | POA: Diagnosis not present

## 2014-09-13 DIAGNOSIS — D631 Anemia in chronic kidney disease: Secondary | ICD-10-CM | POA: Diagnosis not present

## 2014-09-13 DIAGNOSIS — N186 End stage renal disease: Secondary | ICD-10-CM | POA: Diagnosis not present

## 2014-09-13 DIAGNOSIS — N2581 Secondary hyperparathyroidism of renal origin: Secondary | ICD-10-CM | POA: Diagnosis not present

## 2014-09-13 DIAGNOSIS — D509 Iron deficiency anemia, unspecified: Secondary | ICD-10-CM | POA: Diagnosis not present

## 2014-09-14 ENCOUNTER — Encounter
Admission: RE | Disposition: A | Discharge status: Home / Self Care | Payer: Self-pay | Source: Ambulatory Visit | Attending: Vascular Surgery

## 2014-09-14 ENCOUNTER — Ambulatory Visit
Admission: RE | Admit: 2014-09-14 | Discharge: 2014-09-14 | Disposition: A | Discharge status: Home / Self Care | Payer: Medicare Other | Source: Ambulatory Visit | Attending: Vascular Surgery | Admitting: Vascular Surgery

## 2014-09-14 ENCOUNTER — Encounter: Payer: Self-pay | Admitting: *Deleted

## 2014-09-14 DIAGNOSIS — Z992 Dependence on renal dialysis: Secondary | ICD-10-CM | POA: Diagnosis not present

## 2014-09-14 DIAGNOSIS — Z7982 Long term (current) use of aspirin: Secondary | ICD-10-CM | POA: Insufficient documentation

## 2014-09-14 DIAGNOSIS — I120 Hypertensive chronic kidney disease with stage 5 chronic kidney disease or end stage renal disease: Secondary | ICD-10-CM | POA: Diagnosis not present

## 2014-09-14 DIAGNOSIS — R0989 Other specified symptoms and signs involving the circulatory and respiratory systems: Secondary | ICD-10-CM | POA: Diagnosis not present

## 2014-09-14 DIAGNOSIS — T82898A Other specified complication of vascular prosthetic devices, implants and grafts, initial encounter: Secondary | ICD-10-CM | POA: Insufficient documentation

## 2014-09-14 DIAGNOSIS — Z79899 Other long term (current) drug therapy: Secondary | ICD-10-CM | POA: Insufficient documentation

## 2014-09-14 DIAGNOSIS — T82858A Stenosis of vascular prosthetic devices, implants and grafts, initial encounter: Secondary | ICD-10-CM | POA: Diagnosis not present

## 2014-09-14 DIAGNOSIS — N186 End stage renal disease: Secondary | ICD-10-CM | POA: Diagnosis not present

## 2014-09-14 DIAGNOSIS — I6529 Occlusion and stenosis of unspecified carotid artery: Secondary | ICD-10-CM | POA: Insufficient documentation

## 2014-09-14 HISTORY — PX: PERIPHERAL VASCULAR CATHETERIZATION: SHX172C

## 2014-09-14 LAB — POTASSIUM (ARMC VASCULAR LAB ONLY): Potassium (ARMC vascular lab): 3.9

## 2014-09-14 LAB — CYTOLOGY - PAP

## 2014-09-14 SURGERY — A/V SHUNTOGRAM/FISTULAGRAM
Anesthesia: Moderate Sedation

## 2014-09-14 MED ORDER — CLINDAMYCIN PHOSPHATE 300 MG/50ML IV SOLN
INTRAVENOUS | Status: AC
  Filled 2014-09-14: qty 50

## 2014-09-14 MED ORDER — FAMOTIDINE 20 MG PO TABS
ORAL_TABLET | ORAL | Status: AC
  Administered 2014-09-14: 20 mg via ORAL
  Filled 2014-09-14: qty 1

## 2014-09-14 MED ORDER — METHYLPREDNISOLONE SODIUM SUCC 125 MG IJ SOLR
INTRAMUSCULAR | Status: AC
  Filled 2014-09-14: qty 2

## 2014-09-14 MED ORDER — SODIUM CHLORIDE 0.9 % IV SOLN: INTRAVENOUS | Status: DC

## 2014-09-14 MED ORDER — CLINDAMYCIN PHOSPHATE 300 MG/50ML IV SOLN
300.0000 mg | Freq: Once | INTRAVENOUS | Status: AC
  Administered 2014-09-14: 300 mg via INTRAVENOUS

## 2014-09-14 MED ORDER — METHYLPREDNISOLONE SODIUM SUCC 125 MG IJ SOLR
125.0000 mg | Freq: Once | INTRAMUSCULAR | Status: AC
  Administered 2014-09-14: 125 mg via INTRAVENOUS

## 2014-09-14 MED ORDER — HEPARIN (PORCINE) IN NACL 2-0.9 UNIT/ML-% IJ SOLN
INTRAMUSCULAR | Status: AC
  Filled 2014-09-14: qty 1000

## 2014-09-14 MED ORDER — IOHEXOL 300 MG/ML  SOLN
INTRAMUSCULAR | Status: DC | PRN
  Administered 2014-09-14: 25 mL via INTRAVENOUS

## 2014-09-14 MED ORDER — HEPARIN SODIUM (PORCINE) 1000 UNIT/ML IJ SOLN
INTRAMUSCULAR | Status: AC
  Filled 2014-09-14: qty 1

## 2014-09-14 MED ORDER — FENTANYL CITRATE (PF) 100 MCG/2ML IJ SOLN
INTRAMUSCULAR | Status: DC | PRN
  Administered 2014-09-14 (×2): 50 ug via INTRAVENOUS

## 2014-09-14 MED ORDER — MIDAZOLAM HCL 5 MG/5ML IJ SOLN
INTRAMUSCULAR | Status: AC
  Filled 2014-09-14: qty 5

## 2014-09-14 MED ORDER — CEFAZOLIN SODIUM 1-5 GM-% IV SOLN
INTRAVENOUS | Status: AC
  Filled 2014-09-14: qty 50

## 2014-09-14 MED ORDER — LIDOCAINE HCL (PF) 1 % IJ SOLN
INTRAMUSCULAR | Status: DC | PRN
  Administered 2014-09-14: 5 mL via SUBCUTANEOUS

## 2014-09-14 MED ORDER — FAMOTIDINE 20 MG PO TABS
20.0000 mg | ORAL_TABLET | Freq: Once | ORAL | Status: AC
  Administered 2014-09-14: 20 mg via ORAL

## 2014-09-14 MED ORDER — FENTANYL CITRATE (PF) 100 MCG/2ML IJ SOLN
INTRAMUSCULAR | Status: AC
  Filled 2014-09-14: qty 2

## 2014-09-14 MED ORDER — MIDAZOLAM HCL 2 MG/2ML IJ SOLN
INTRAMUSCULAR | Status: DC | PRN
  Administered 2014-09-14: 2 mg via INTRAVENOUS
  Administered 2014-09-14: 1 mg via INTRAVENOUS

## 2014-09-14 MED ORDER — LIDOCAINE HCL (PF) 1 % IJ SOLN
INTRAMUSCULAR | Status: AC
Start: 2014-09-14 — End: 2014-09-14
  Filled 2014-09-14: qty 10

## 2014-09-14 MED ORDER — HEPARIN SODIUM (PORCINE) 1000 UNIT/ML IJ SOLN
INTRAMUSCULAR | Status: DC | PRN
  Administered 2014-09-14: 3000 [IU] via INTRAVENOUS

## 2014-09-14 SURGICAL SUPPLY — 11 items
BALLN DORADO 10X40X80 (BALLOONS) ×2
BALLN DORADO 8X60X80 (BALLOONS) ×2
BALLOON DORADO 10X40X80 (BALLOONS) ×1 IMPLANT
BALLOON DORADO 8X60X80 (BALLOONS) ×1 IMPLANT
DEVICE PRESTO INFLATION (MISCELLANEOUS) ×2 IMPLANT
DRAPE BRACHIAL (DRAPES) ×2 IMPLANT
PACK ANGIOGRAPHY (CUSTOM PROCEDURE TRAY) ×2 IMPLANT
SET INTRO CAPELLA COAXIAL (SET/KITS/TRAYS/PACK) ×2 IMPLANT
SHEATH BRITE TIP 6FRX5.5 (SHEATH) ×2 IMPLANT
TOWEL OR 17X26 4PK STRL BLUE (TOWEL DISPOSABLE) ×2 IMPLANT
WIRE MAGIC TORQUE 260C (WIRE) ×2 IMPLANT

## 2014-09-14 NOTE — Op Note (Signed)
OPERATIVE NOTE   PROCEDURE: 1. Contrast injection right arm brachial axillary dialysis graft. 2. Percutaneous transluminal angioplasty of the midportion right arm brachial axillary dialysis graft. 3. Percutaneous transluminal angioplasty of the innominate vein.  PRE-OPERATIVE DIAGNOSIS: Complication of dialysis access                                                       End Stage Renal Disease  POST-OPERATIVE DIAGNOSIS: same as above   SURGEON: Katha Cabal, M.D.  ANESTHESIA: Conscious Sedation   ESTIMATED BLOOD LOSS: minimal  FINDING(S): 1. 2 separate and distinct greater than 80% lesions one located in the midportion of the AV graft and the second located in the innominate vein centrally.  SPECIMEN(S):  None  CONTRAST: 25 cc  FLUOROSCOPY TIME: 1.9 minutes  INDICATIONS: [name] Marks is a 59 y.o. female who  presents with malfunctioning right arm AV access.  The patient is scheduled for angiography with possible intervention of the AV access.  The patient is aware the risks include but are not limited to: bleeding, infection, thrombosis of the cannulated access, and possible anaphylactic reaction to the contrast.  The patient acknowledges if the access can not be salvaged a tunneled catheter will be needed and will be placed during this procedure.  The patient is aware of the risks of the procedure and elects to proceed with the angiogram and intervention.  DESCRIPTION: After full informed written consent was obtained, the patient was brought back to the Special Procedure suite and placed supine position.  Appropriate cardiopulmonary monitors were placed.  The right arm was prepped and draped in the standard fashion.  Appropriate timeout is called. The right brachial axillary dialysis graft  was cannulated with a micropuncture needle under ultrasound guidance.  The graft was imaged with ultrasound real-time it was noted to be echolucent indicating patency images recorded for  the permanent record. The actual puncture was made under direct ultrasound visualization  The microwire was advanced and the needle was exchanged for  a microsheath.  The J-wire was then advanced and a 6 Fr sheath inserted.  Hand injections were completed to image the access from the arterial anastomosis through the entire access.  The central venous structures were also imaged by hand injections.  Based on the images,  imaging of the AV graft demonstrates a previously placed stent in the arterial portion which is widely patent. More proximally on the arm within them midportion of the AV graft there is a string sign extending approximate 3 cm. Previously placed axillary and subclavian stents are widely patent. At the edge of the stent extending into the innominate there is a string sign noted. Superior vena cava is widely patent. With the initial balloon inflation reflux imaging is performed of the arterial anastomosis and this is widely patent as are the visualized portions of the brachial artery.  3000 units of heparin was given and a Magic torque wires advanced through the 6 French sheath under fluoroscopic guidance. It is positioned with its tip in the inferior vena cava. Sheath was upsized to a 7 [place] sheath. Initially and 8 x 60 Dor[place] balloon is used to angioplasty the midportion of the AV graft. The balloon is then deflated and advanced across the innominate lesion as well to predilate this area. Both of these inflations were to  16 atm for 1 minute. Subsequently a 10 x 4 [place] is advanced across the innominate lesion and inflated to 16 atm for 2 full minutes. Follow-up imaging now demonstrates Pleat resolution of both lesions the lesion within the graft has been treated with an 8 mm balloon inflation and the lesion in the innominate has been treated with a 10 mm balloon inflation.  A 4-0 Monocryl purse-string suture was sewn around the sheath.  The sheath was removed and light pressure was applied.   A sterile bandage was applied to the puncture site.    COMPLICATIONS: None  CONDITION: [name] Marks, M.D [facility] Office: [phone]  09/14/2014 12:07 PM

## 2014-09-14 NOTE — Discharge Instructions (Signed)
.  armcThe drugs you were given will stay in your system until tomorrow, so for the next 24 hours you should not.  Drive an automobile. Make any legal decisions.  Drink any alcoholic beverages.  Today you should start with liquids and gradually work up to solid foods as your are able to tolerate them  Resume your regular medications as prescribed by your doctor.  Avoid aspirin for 24 hours.    Change the Band-Aid or dressing as needed.  After a 2 days no dressing as needed.  Avoid strenuous activity for the remainder of the day.  Please notify your primary physician immediately if you have any unusual bleeding, trouble breathing, fever >100 degrees or pain not relieved by the medication your doctor prescribed for your doctor prescribed for you physician

## 2014-09-14 NOTE — H&P (Signed)
[facility] History & Physical Update  The patient was interviewed and re-examined.  The patient's previous History and Physical has been reviewed and is unchanged.  There is no change in the plan of care. We plan to proceed with the scheduled procedure.  Schnier, Dolores Lory, MD  09/14/2014, 11:31 AM

## 2014-09-15 ENCOUNTER — Encounter: Payer: Self-pay | Admitting: Vascular Surgery

## 2014-09-15 DIAGNOSIS — N2581 Secondary hyperparathyroidism of renal origin: Secondary | ICD-10-CM | POA: Diagnosis not present

## 2014-09-15 DIAGNOSIS — D509 Iron deficiency anemia, unspecified: Secondary | ICD-10-CM | POA: Diagnosis not present

## 2014-09-15 DIAGNOSIS — N186 End stage renal disease: Secondary | ICD-10-CM | POA: Diagnosis not present

## 2014-09-15 DIAGNOSIS — D631 Anemia in chronic kidney disease: Secondary | ICD-10-CM | POA: Diagnosis not present

## 2014-09-17 DIAGNOSIS — N186 End stage renal disease: Secondary | ICD-10-CM | POA: Diagnosis not present

## 2014-09-17 DIAGNOSIS — D631 Anemia in chronic kidney disease: Secondary | ICD-10-CM | POA: Diagnosis not present

## 2014-09-17 DIAGNOSIS — N2581 Secondary hyperparathyroidism of renal origin: Secondary | ICD-10-CM | POA: Diagnosis not present

## 2014-09-17 DIAGNOSIS — D509 Iron deficiency anemia, unspecified: Secondary | ICD-10-CM | POA: Diagnosis not present

## 2014-09-20 DIAGNOSIS — D631 Anemia in chronic kidney disease: Secondary | ICD-10-CM | POA: Diagnosis not present

## 2014-09-20 DIAGNOSIS — N186 End stage renal disease: Secondary | ICD-10-CM | POA: Diagnosis not present

## 2014-09-20 DIAGNOSIS — D509 Iron deficiency anemia, unspecified: Secondary | ICD-10-CM | POA: Diagnosis not present

## 2014-09-20 DIAGNOSIS — N2581 Secondary hyperparathyroidism of renal origin: Secondary | ICD-10-CM | POA: Diagnosis not present

## 2014-09-22 DIAGNOSIS — D509 Iron deficiency anemia, unspecified: Secondary | ICD-10-CM | POA: Diagnosis not present

## 2014-09-22 DIAGNOSIS — D631 Anemia in chronic kidney disease: Secondary | ICD-10-CM | POA: Diagnosis not present

## 2014-09-22 DIAGNOSIS — N2581 Secondary hyperparathyroidism of renal origin: Secondary | ICD-10-CM | POA: Diagnosis not present

## 2014-09-22 DIAGNOSIS — N186 End stage renal disease: Secondary | ICD-10-CM | POA: Diagnosis not present

## 2014-09-24 DIAGNOSIS — D631 Anemia in chronic kidney disease: Secondary | ICD-10-CM | POA: Diagnosis not present

## 2014-09-24 DIAGNOSIS — D509 Iron deficiency anemia, unspecified: Secondary | ICD-10-CM | POA: Diagnosis not present

## 2014-09-24 DIAGNOSIS — N186 End stage renal disease: Secondary | ICD-10-CM | POA: Diagnosis not present

## 2014-09-24 DIAGNOSIS — N2581 Secondary hyperparathyroidism of renal origin: Secondary | ICD-10-CM | POA: Diagnosis not present

## 2014-09-26 DIAGNOSIS — Z992 Dependence on renal dialysis: Secondary | ICD-10-CM | POA: Diagnosis not present

## 2014-09-26 DIAGNOSIS — N186 End stage renal disease: Secondary | ICD-10-CM | POA: Diagnosis not present

## 2014-09-26 DIAGNOSIS — N039 Chronic nephritic syndrome with unspecified morphologic changes: Secondary | ICD-10-CM | POA: Diagnosis not present

## 2014-09-27 DIAGNOSIS — N2581 Secondary hyperparathyroidism of renal origin: Secondary | ICD-10-CM | POA: Diagnosis not present

## 2014-09-27 DIAGNOSIS — D509 Iron deficiency anemia, unspecified: Secondary | ICD-10-CM | POA: Diagnosis not present

## 2014-09-27 DIAGNOSIS — D631 Anemia in chronic kidney disease: Secondary | ICD-10-CM | POA: Diagnosis not present

## 2014-09-27 DIAGNOSIS — N186 End stage renal disease: Secondary | ICD-10-CM | POA: Diagnosis not present

## 2014-09-29 DIAGNOSIS — N2581 Secondary hyperparathyroidism of renal origin: Secondary | ICD-10-CM | POA: Diagnosis not present

## 2014-09-29 DIAGNOSIS — D509 Iron deficiency anemia, unspecified: Secondary | ICD-10-CM | POA: Diagnosis not present

## 2014-09-29 DIAGNOSIS — N186 End stage renal disease: Secondary | ICD-10-CM | POA: Diagnosis not present

## 2014-09-29 DIAGNOSIS — D631 Anemia in chronic kidney disease: Secondary | ICD-10-CM | POA: Diagnosis not present

## 2014-10-01 DIAGNOSIS — N186 End stage renal disease: Secondary | ICD-10-CM | POA: Diagnosis not present

## 2014-10-01 DIAGNOSIS — D509 Iron deficiency anemia, unspecified: Secondary | ICD-10-CM | POA: Diagnosis not present

## 2014-10-01 DIAGNOSIS — N2581 Secondary hyperparathyroidism of renal origin: Secondary | ICD-10-CM | POA: Diagnosis not present

## 2014-10-01 DIAGNOSIS — D631 Anemia in chronic kidney disease: Secondary | ICD-10-CM | POA: Diagnosis not present

## 2014-10-04 DIAGNOSIS — D631 Anemia in chronic kidney disease: Secondary | ICD-10-CM | POA: Diagnosis not present

## 2014-10-04 DIAGNOSIS — N186 End stage renal disease: Secondary | ICD-10-CM | POA: Diagnosis not present

## 2014-10-04 DIAGNOSIS — D509 Iron deficiency anemia, unspecified: Secondary | ICD-10-CM | POA: Diagnosis not present

## 2014-10-04 DIAGNOSIS — N2581 Secondary hyperparathyroidism of renal origin: Secondary | ICD-10-CM | POA: Diagnosis not present

## 2014-10-06 DIAGNOSIS — N2581 Secondary hyperparathyroidism of renal origin: Secondary | ICD-10-CM | POA: Diagnosis not present

## 2014-10-06 DIAGNOSIS — D631 Anemia in chronic kidney disease: Secondary | ICD-10-CM | POA: Diagnosis not present

## 2014-10-06 DIAGNOSIS — N186 End stage renal disease: Secondary | ICD-10-CM | POA: Diagnosis not present

## 2014-10-06 DIAGNOSIS — D509 Iron deficiency anemia, unspecified: Secondary | ICD-10-CM | POA: Diagnosis not present

## 2014-10-08 DIAGNOSIS — N186 End stage renal disease: Secondary | ICD-10-CM | POA: Diagnosis not present

## 2014-10-08 DIAGNOSIS — D509 Iron deficiency anemia, unspecified: Secondary | ICD-10-CM | POA: Diagnosis not present

## 2014-10-08 DIAGNOSIS — D631 Anemia in chronic kidney disease: Secondary | ICD-10-CM | POA: Diagnosis not present

## 2014-10-08 DIAGNOSIS — N2581 Secondary hyperparathyroidism of renal origin: Secondary | ICD-10-CM | POA: Diagnosis not present

## 2014-10-11 DIAGNOSIS — N186 End stage renal disease: Secondary | ICD-10-CM | POA: Diagnosis not present

## 2014-10-11 DIAGNOSIS — N2581 Secondary hyperparathyroidism of renal origin: Secondary | ICD-10-CM | POA: Diagnosis not present

## 2014-10-11 DIAGNOSIS — D509 Iron deficiency anemia, unspecified: Secondary | ICD-10-CM | POA: Diagnosis not present

## 2014-10-11 DIAGNOSIS — D631 Anemia in chronic kidney disease: Secondary | ICD-10-CM | POA: Diagnosis not present

## 2014-10-13 DIAGNOSIS — D509 Iron deficiency anemia, unspecified: Secondary | ICD-10-CM | POA: Diagnosis not present

## 2014-10-13 DIAGNOSIS — N186 End stage renal disease: Secondary | ICD-10-CM | POA: Diagnosis not present

## 2014-10-13 DIAGNOSIS — N2581 Secondary hyperparathyroidism of renal origin: Secondary | ICD-10-CM | POA: Diagnosis not present

## 2014-10-13 DIAGNOSIS — D631 Anemia in chronic kidney disease: Secondary | ICD-10-CM | POA: Diagnosis not present

## 2014-10-14 DIAGNOSIS — T82318A Breakdown (mechanical) of other vascular grafts, initial encounter: Secondary | ICD-10-CM | POA: Diagnosis not present

## 2014-10-14 DIAGNOSIS — Y841 Kidney dialysis as the cause of abnormal reaction of the patient, or of later complication, without mention of misadventure at the time of the procedure: Secondary | ICD-10-CM | POA: Diagnosis not present

## 2014-10-14 DIAGNOSIS — I10 Essential (primary) hypertension: Secondary | ICD-10-CM | POA: Diagnosis not present

## 2014-10-14 DIAGNOSIS — T82858A Stenosis of vascular prosthetic devices, implants and grafts, initial encounter: Secondary | ICD-10-CM | POA: Diagnosis not present

## 2014-10-14 DIAGNOSIS — N186 End stage renal disease: Secondary | ICD-10-CM | POA: Diagnosis not present

## 2014-10-14 DIAGNOSIS — Z992 Dependence on renal dialysis: Secondary | ICD-10-CM | POA: Diagnosis not present

## 2014-10-15 DIAGNOSIS — D509 Iron deficiency anemia, unspecified: Secondary | ICD-10-CM | POA: Diagnosis not present

## 2014-10-15 DIAGNOSIS — N186 End stage renal disease: Secondary | ICD-10-CM | POA: Diagnosis not present

## 2014-10-15 DIAGNOSIS — N2581 Secondary hyperparathyroidism of renal origin: Secondary | ICD-10-CM | POA: Diagnosis not present

## 2014-10-15 DIAGNOSIS — D631 Anemia in chronic kidney disease: Secondary | ICD-10-CM | POA: Diagnosis not present

## 2014-10-18 DIAGNOSIS — N186 End stage renal disease: Secondary | ICD-10-CM | POA: Diagnosis not present

## 2014-10-18 DIAGNOSIS — D631 Anemia in chronic kidney disease: Secondary | ICD-10-CM | POA: Diagnosis not present

## 2014-10-18 DIAGNOSIS — N2581 Secondary hyperparathyroidism of renal origin: Secondary | ICD-10-CM | POA: Diagnosis not present

## 2014-10-18 DIAGNOSIS — D509 Iron deficiency anemia, unspecified: Secondary | ICD-10-CM | POA: Diagnosis not present

## 2014-10-20 DIAGNOSIS — D631 Anemia in chronic kidney disease: Secondary | ICD-10-CM | POA: Diagnosis not present

## 2014-10-20 DIAGNOSIS — N2581 Secondary hyperparathyroidism of renal origin: Secondary | ICD-10-CM | POA: Diagnosis not present

## 2014-10-20 DIAGNOSIS — D509 Iron deficiency anemia, unspecified: Secondary | ICD-10-CM | POA: Diagnosis not present

## 2014-10-20 DIAGNOSIS — N186 End stage renal disease: Secondary | ICD-10-CM | POA: Diagnosis not present

## 2014-10-22 DIAGNOSIS — D631 Anemia in chronic kidney disease: Secondary | ICD-10-CM | POA: Diagnosis not present

## 2014-10-22 DIAGNOSIS — N186 End stage renal disease: Secondary | ICD-10-CM | POA: Diagnosis not present

## 2014-10-22 DIAGNOSIS — D509 Iron deficiency anemia, unspecified: Secondary | ICD-10-CM | POA: Diagnosis not present

## 2014-10-22 DIAGNOSIS — N2581 Secondary hyperparathyroidism of renal origin: Secondary | ICD-10-CM | POA: Diagnosis not present

## 2014-10-25 DIAGNOSIS — D509 Iron deficiency anemia, unspecified: Secondary | ICD-10-CM | POA: Diagnosis not present

## 2014-10-25 DIAGNOSIS — D631 Anemia in chronic kidney disease: Secondary | ICD-10-CM | POA: Diagnosis not present

## 2014-10-25 DIAGNOSIS — N2581 Secondary hyperparathyroidism of renal origin: Secondary | ICD-10-CM | POA: Diagnosis not present

## 2014-10-25 DIAGNOSIS — N186 End stage renal disease: Secondary | ICD-10-CM | POA: Diagnosis not present

## 2014-10-27 DIAGNOSIS — N039 Chronic nephritic syndrome with unspecified morphologic changes: Secondary | ICD-10-CM | POA: Diagnosis not present

## 2014-10-27 DIAGNOSIS — D631 Anemia in chronic kidney disease: Secondary | ICD-10-CM | POA: Diagnosis not present

## 2014-10-27 DIAGNOSIS — N2581 Secondary hyperparathyroidism of renal origin: Secondary | ICD-10-CM | POA: Diagnosis not present

## 2014-10-27 DIAGNOSIS — N186 End stage renal disease: Secondary | ICD-10-CM | POA: Diagnosis not present

## 2014-10-27 DIAGNOSIS — Z992 Dependence on renal dialysis: Secondary | ICD-10-CM | POA: Diagnosis not present

## 2014-10-27 DIAGNOSIS — D509 Iron deficiency anemia, unspecified: Secondary | ICD-10-CM | POA: Diagnosis not present

## 2014-10-28 ENCOUNTER — Encounter: Payer: Self-pay | Admitting: Internal Medicine

## 2014-10-28 ENCOUNTER — Ambulatory Visit (INDEPENDENT_AMBULATORY_CARE_PROVIDER_SITE_OTHER): Payer: Medicare Other | Admitting: Internal Medicine

## 2014-10-28 VITALS — BP 109/47 | HR 73 | Temp 98.1°F | Ht 61.0 in | Wt 226.0 lb

## 2014-10-28 DIAGNOSIS — N186 End stage renal disease: Secondary | ICD-10-CM

## 2014-10-28 DIAGNOSIS — M25511 Pain in right shoulder: Secondary | ICD-10-CM | POA: Diagnosis not present

## 2014-10-28 DIAGNOSIS — M25512 Pain in left shoulder: Secondary | ICD-10-CM

## 2014-10-28 DIAGNOSIS — E669 Obesity, unspecified: Secondary | ICD-10-CM | POA: Diagnosis not present

## 2014-10-28 DIAGNOSIS — R7301 Impaired fasting glucose: Secondary | ICD-10-CM

## 2014-10-28 DIAGNOSIS — Z0000 Encounter for general adult medical examination without abnormal findings: Secondary | ICD-10-CM | POA: Diagnosis not present

## 2014-10-28 LAB — CMP AND LIVER
ALBUMIN: 3.7 g/dL (ref 3.6–5.1)
ALK PHOS: 163 U/L — AB (ref 33–130)
ALT: 8 U/L (ref 6–29)
AST: 11 U/L (ref 10–35)
BILIRUBIN INDIRECT: 0.3 mg/dL (ref 0.2–1.2)
BUN: 18 mg/dL (ref 7–25)
Bilirubin, Direct: 0.1 mg/dL (ref <=0.2)
CO2: 30 mmol/L (ref 20–31)
Calcium: 9.6 mg/dL (ref 8.6–10.4)
Chloride: 96 mmol/L — ABNORMAL LOW (ref 98–110)
Creat: 5.85 mg/dL — ABNORMAL HIGH (ref 0.50–0.99)
GLUCOSE: 93 mg/dL (ref 65–99)
Potassium: 3.6 mmol/L (ref 3.5–5.3)
Sodium: 140 mmol/L (ref 135–146)
Total Bilirubin: 0.4 mg/dL (ref 0.2–1.2)
Total Protein: 6.8 g/dL (ref 6.1–8.1)

## 2014-10-28 LAB — LIPID PANEL
Cholesterol: 170 mg/dL (ref 125–200)
HDL: 53 mg/dL (ref >=46)
LDL Cholesterol: 96 mg/dL (ref <130)
Total CHOL/HDL Ratio: 3.2 Ratio (ref <=5.0)
Triglycerides: 107 mg/dL (ref <150)
VLDL: 21 mg/dL (ref <30)

## 2014-10-28 LAB — CBC
HCT: 34.8 % — ABNORMAL LOW (ref 36.0–46.0)
HEMOGLOBIN: 11.6 g/dL — AB (ref 12.0–15.0)
MCH: 30 pg (ref 26.0–34.0)
MCHC: 33.3 g/dL (ref 30.0–36.0)
MCV: 89.9 fL (ref 78.0–100.0)
MPV: 9.5 fL (ref 8.6–12.4)
Platelets: 289 10*3/uL (ref 150–400)
RBC: 3.87 MIL/uL (ref 3.87–5.11)
RDW: 16.3 % — ABNORMAL HIGH (ref 11.5–15.5)
WBC: 6.2 10*3/uL (ref 4.0–10.5)

## 2014-10-28 LAB — HEPATITIS C ANTIBODY: HCV AB: NEGATIVE

## 2014-10-28 LAB — POCT GLYCOSYLATED HEMOGLOBIN (HGB A1C): HEMOGLOBIN A1C: 5.3

## 2014-10-28 MED ORDER — TETANUS-DIPHTH-ACELL PERTUSSIS 5-2.5-18.5 LF-MCG/0.5 IM SUSP: 0.5000 mL | Freq: Once | INTRAMUSCULAR | Status: DC

## 2014-10-28 NOTE — Patient Instructions (Addendum)
Thank you for coming in today.    For your shoulder pain I want you start doing exercises that I have given you daily. Please take tylenol as needed for pain. If no improvement please return   Will draw maintenance labs today and will send you results   Return as needed in 3-6 months.

## 2014-10-28 NOTE — Assessment & Plan Note (Signed)
ESRD  - Dialysis MWF  - Will get Flu shot in the fall

## 2014-10-28 NOTE — Assessment & Plan Note (Signed)
Health Care Maintenance  - Will get CBC, CMP, Lipid Panel, HgbA1C and Hep C - Will provide patient with prescription for TDAP vaccine  - Mammogram 04/2014  - Will get colonoscopy November 23, 2014  - No mood issues, good home support (son and church)  - Never smoked and no ETOH use

## 2014-10-28 NOTE — Assessment & Plan Note (Addendum)
Elevated BMI 42.1. Patient not interested in discussing weight loss at this time. Recently lost 20lbs per patient  - Lunch :steak biscuit, dinner: pork chops, spaghetti, and cabbage  - Will get A1C, (patient with elevated fasting glucose in the past) - Continue Aspirin 81 mg

## 2014-10-28 NOTE — Progress Notes (Signed)
Patient ID: [name] Marks, female   DOB: 06-12-1955, 59 y.o.   MRN: [id number]    [facility] Kerrin Mo, MD Phone: [phone]  Subjective:   # Obesity- Patient not currently interested in losing weight, diet includes fast food ([place] etc)   #ESRD - sometimes gets headaches after dialysis but these will resolve after a couple of hours. Patient takes midodrine, cinacalcet and sevelamer. Gets flu shot every year.   # Health Maintenance  Patient has good support at home. Lives with son and has church members whom she can turn to for support. Colonoscopy schedule, pap smear done in July, Mammogram in March 2016  # Shoulder pain- bilateral shoulder pain. Duration over the past few months. Has received Roxicodone per endocrinology in past for this however not recently. Decreased ROM due to pain and worse at night.   All relevant systems were reviewed and were negative unless otherwise noted in the HPI  Past Medical History Reviewed problem list.  Medications- reviewed and updated Current Outpatient Prescriptions  Medication Sig Dispense Refill  . albuterol (PROVENTIL HFA;VENTOLIN HFA) 108 (90 BASE) MCG/ACT inhaler Inhale 2 puffs into the lungs every 6 (six) hours as needed for wheezing.    . cetirizine (ZYRTEC) 10 MG tablet Take 1 tablet (10 mg total) by mouth daily. One tab daily for allergies 30 tablet 1  . cinacalcet (SENSIPAR) 60 MG tablet Take 60 mg by mouth daily.    . Famotidine (PEPCID PO) Take 1 tablet by mouth daily as needed (indigestion).     . fluticasone (FLONASE) 50 MCG/ACT nasal spray Place 1 spray into both nostrils 2 (two) times daily. 1 g 2  . guaiFENesin-codeine 100-10 MG/5ML syrup Take 5 mLs by mouth at bedtime as needed for cough. (Patient not taking: Reported on 09/09/2014) 120 mL 0  . ibuprofen (ADVIL,MOTRIN) 200 MG tablet Take 400 mg by mouth every 6 (six) hours as needed for pain.    Marland Kitchen ipratropium (ATROVENT) 0.06 % nasal spray Place  2 sprays into both nostrils 4 (four) times daily. 15 mL 1  . midodrine (PROAMATINE) 10 MG tablet Take 10 mg by mouth daily.    . Multiple Vitamins-Minerals (PRORENAL VITAL) TABS Take 1 tablet by mouth daily.    . sevelamer (RENVELA) 800 MG tablet Take 1,600 mg by mouth 3 (three) times daily with meals.     . Tdap (BOOSTRIX) 5-2.5-18.5 LF-MCG/0.5 injection Inject 0.5 mLs into the muscle once. 0.5 mL 0  . terconazole (TERAZOL 7) 0.4 % vaginal cream Place 1 applicator vaginally at bedtime. 45 g 0  . tinidazole (TINDAMAX) 500 MG tablet Take four tablets today and four tablets tomorrow at the same time 8 tablet 0   No current facility-administered medications for this visit.   Chief complaint-noted No additions to family history Social history- patient is a non-smoker  Objective: BP 109/47 mmHg  Pulse 73  Temp(Src) 98.1 F (36.7 C) (Oral)  Ht 5\' 1"  (1.549 m)  Wt 226 lb (102.513 kg)  BMI 42.72 kg/m2 Gen: NAD, alert, cooperative with exam HEENT: NCAT, EOMI, PERRL, TMs nml, sclera icterus  Neck: FROM, supple, no thyromegaly  CV: RRR, good S1/S2, no murmur, cap refill <3, no lower extremity edema  Resp: CTABL, no wheezes, non-labored Abd: SNTND, BS present, no guarding or organomegaly Ext: Patient with pain with active extension of arms. Able to get full ROM with passive extension of arms, no pain on palpation of shoulder joint, no erythema or warmth of  either shoulder. Neuro: Alert and oriented, No gross deficits Skin: no rashes no lesions  Assessment/Plan:  Obesity Elevated BMI 42.1. Patient not interested in discussing weight loss at this time. Recently lost 20lbs per patient  - Lunch :steak biscuit, dinner: pork chops, spaghetti, and cabbage  - Will get A1C, (patient with elevated fasting glucose in the past) - Continue Aspirin 81 mg     RENAL DISEASE, END STAGE ESRD  - Dialysis MWF  - Will get Flu shot in the fall    Health care maintenance Health Care Maintenance  - Will  get CBC, CMP, Lipid Panel, HgbA1C and Hep C - Will provide patient with prescription for TDAP vaccine  - Mammogram 04/2014  - Will get colonoscopy November 23, 2014  - No mood issues, good home support (son and church)  - Never smoked and no ETOH use   Shoulder pain, bilateral Shoulder pain  - Biceps tendonitis vs. Adhesive Capsulitis. Patient with chronic pain in bilateral shoulders. Pain sometimes worse at night with sleep and with movement of arms. Likely more a biceps tendonitis due to lack of true mechanical restriction vs. motion is a true mechanical restriction, rather than a pain-related limitation.  Patient with painful arc of motion, which is worse at night. -  Will start tylenol  -  Will provide patient with exercises -  Please follow up as needed.

## 2014-10-28 NOTE — Assessment & Plan Note (Signed)
Shoulder pain  - Biceps tendonitis vs. Adhesive Capsulitis. Patient with chronic pain in bilateral shoulders. Pain sometimes worse at night with sleep and with movement of arms. Likely more a biceps tendonitis due to lack of true mechanical restriction vs. motion is a true mechanical restriction, rather than a pain-related limitation.  Patient with painful arc of motion, which is worse at night. -  Will start tylenol  -  Will provide patient with exercises -  Please follow up as needed.

## 2014-10-29 DIAGNOSIS — D509 Iron deficiency anemia, unspecified: Secondary | ICD-10-CM | POA: Diagnosis not present

## 2014-10-29 DIAGNOSIS — D631 Anemia in chronic kidney disease: Secondary | ICD-10-CM | POA: Diagnosis not present

## 2014-10-29 DIAGNOSIS — N186 End stage renal disease: Secondary | ICD-10-CM | POA: Diagnosis not present

## 2014-10-29 DIAGNOSIS — N2581 Secondary hyperparathyroidism of renal origin: Secondary | ICD-10-CM | POA: Diagnosis not present

## 2014-10-30 ENCOUNTER — Encounter: Payer: Self-pay | Admitting: Internal Medicine

## 2014-11-01 DIAGNOSIS — D509 Iron deficiency anemia, unspecified: Secondary | ICD-10-CM | POA: Diagnosis not present

## 2014-11-01 DIAGNOSIS — N2581 Secondary hyperparathyroidism of renal origin: Secondary | ICD-10-CM | POA: Diagnosis not present

## 2014-11-01 DIAGNOSIS — D631 Anemia in chronic kidney disease: Secondary | ICD-10-CM | POA: Diagnosis not present

## 2014-11-01 DIAGNOSIS — N186 End stage renal disease: Secondary | ICD-10-CM | POA: Diagnosis not present

## 2014-11-02 DIAGNOSIS — Z23 Encounter for immunization: Secondary | ICD-10-CM | POA: Diagnosis not present

## 2014-11-03 DIAGNOSIS — D631 Anemia in chronic kidney disease: Secondary | ICD-10-CM | POA: Diagnosis not present

## 2014-11-03 DIAGNOSIS — N2581 Secondary hyperparathyroidism of renal origin: Secondary | ICD-10-CM | POA: Diagnosis not present

## 2014-11-03 DIAGNOSIS — D509 Iron deficiency anemia, unspecified: Secondary | ICD-10-CM | POA: Diagnosis not present

## 2014-11-03 DIAGNOSIS — N186 End stage renal disease: Secondary | ICD-10-CM | POA: Diagnosis not present

## 2014-11-05 DIAGNOSIS — D509 Iron deficiency anemia, unspecified: Secondary | ICD-10-CM | POA: Diagnosis not present

## 2014-11-05 DIAGNOSIS — N186 End stage renal disease: Secondary | ICD-10-CM | POA: Diagnosis not present

## 2014-11-05 DIAGNOSIS — D631 Anemia in chronic kidney disease: Secondary | ICD-10-CM | POA: Diagnosis not present

## 2014-11-05 DIAGNOSIS — N2581 Secondary hyperparathyroidism of renal origin: Secondary | ICD-10-CM | POA: Diagnosis not present

## 2014-11-08 DIAGNOSIS — N186 End stage renal disease: Secondary | ICD-10-CM | POA: Diagnosis not present

## 2014-11-08 DIAGNOSIS — D509 Iron deficiency anemia, unspecified: Secondary | ICD-10-CM | POA: Diagnosis not present

## 2014-11-08 DIAGNOSIS — N2581 Secondary hyperparathyroidism of renal origin: Secondary | ICD-10-CM | POA: Diagnosis not present

## 2014-11-08 DIAGNOSIS — D631 Anemia in chronic kidney disease: Secondary | ICD-10-CM | POA: Diagnosis not present

## 2014-11-10 DIAGNOSIS — N2581 Secondary hyperparathyroidism of renal origin: Secondary | ICD-10-CM | POA: Diagnosis not present

## 2014-11-10 DIAGNOSIS — D509 Iron deficiency anemia, unspecified: Secondary | ICD-10-CM | POA: Diagnosis not present

## 2014-11-10 DIAGNOSIS — N186 End stage renal disease: Secondary | ICD-10-CM | POA: Diagnosis not present

## 2014-11-10 DIAGNOSIS — D631 Anemia in chronic kidney disease: Secondary | ICD-10-CM | POA: Diagnosis not present

## 2014-11-12 DIAGNOSIS — N2581 Secondary hyperparathyroidism of renal origin: Secondary | ICD-10-CM | POA: Diagnosis not present

## 2014-11-12 DIAGNOSIS — D631 Anemia in chronic kidney disease: Secondary | ICD-10-CM | POA: Diagnosis not present

## 2014-11-12 DIAGNOSIS — N186 End stage renal disease: Secondary | ICD-10-CM | POA: Diagnosis not present

## 2014-11-12 DIAGNOSIS — D509 Iron deficiency anemia, unspecified: Secondary | ICD-10-CM | POA: Diagnosis not present

## 2014-11-15 DIAGNOSIS — N2581 Secondary hyperparathyroidism of renal origin: Secondary | ICD-10-CM | POA: Diagnosis not present

## 2014-11-15 DIAGNOSIS — D509 Iron deficiency anemia, unspecified: Secondary | ICD-10-CM | POA: Diagnosis not present

## 2014-11-15 DIAGNOSIS — N186 End stage renal disease: Secondary | ICD-10-CM | POA: Diagnosis not present

## 2014-11-15 DIAGNOSIS — D631 Anemia in chronic kidney disease: Secondary | ICD-10-CM | POA: Diagnosis not present

## 2014-11-17 DIAGNOSIS — N2581 Secondary hyperparathyroidism of renal origin: Secondary | ICD-10-CM | POA: Diagnosis not present

## 2014-11-17 DIAGNOSIS — D509 Iron deficiency anemia, unspecified: Secondary | ICD-10-CM | POA: Diagnosis not present

## 2014-11-17 DIAGNOSIS — D631 Anemia in chronic kidney disease: Secondary | ICD-10-CM | POA: Diagnosis not present

## 2014-11-17 DIAGNOSIS — N186 End stage renal disease: Secondary | ICD-10-CM | POA: Diagnosis not present

## 2014-11-19 DIAGNOSIS — D631 Anemia in chronic kidney disease: Secondary | ICD-10-CM | POA: Diagnosis not present

## 2014-11-19 DIAGNOSIS — N186 End stage renal disease: Secondary | ICD-10-CM | POA: Diagnosis not present

## 2014-11-19 DIAGNOSIS — D509 Iron deficiency anemia, unspecified: Secondary | ICD-10-CM | POA: Diagnosis not present

## 2014-11-19 DIAGNOSIS — N2581 Secondary hyperparathyroidism of renal origin: Secondary | ICD-10-CM | POA: Diagnosis not present

## 2014-11-22 DIAGNOSIS — D631 Anemia in chronic kidney disease: Secondary | ICD-10-CM | POA: Diagnosis not present

## 2014-11-22 DIAGNOSIS — N186 End stage renal disease: Secondary | ICD-10-CM | POA: Diagnosis not present

## 2014-11-22 DIAGNOSIS — N2581 Secondary hyperparathyroidism of renal origin: Secondary | ICD-10-CM | POA: Diagnosis not present

## 2014-11-22 DIAGNOSIS — D509 Iron deficiency anemia, unspecified: Secondary | ICD-10-CM | POA: Diagnosis not present

## 2014-11-23 ENCOUNTER — Other Ambulatory Visit: Payer: Self-pay | Admitting: Gastroenterology

## 2014-11-23 DIAGNOSIS — D126 Benign neoplasm of colon, unspecified: Secondary | ICD-10-CM | POA: Diagnosis not present

## 2014-11-23 DIAGNOSIS — Z1211 Encounter for screening for malignant neoplasm of colon: Secondary | ICD-10-CM | POA: Diagnosis not present

## 2014-11-23 DIAGNOSIS — K640 First degree hemorrhoids: Secondary | ICD-10-CM | POA: Diagnosis not present

## 2014-11-23 DIAGNOSIS — Z800 Family history of malignant neoplasm of digestive organs: Secondary | ICD-10-CM | POA: Diagnosis not present

## 2014-11-23 DIAGNOSIS — K5730 Diverticulosis of large intestine without perforation or abscess without bleeding: Secondary | ICD-10-CM | POA: Diagnosis not present

## 2014-11-23 DIAGNOSIS — D124 Benign neoplasm of descending colon: Secondary | ICD-10-CM | POA: Diagnosis not present

## 2014-11-24 DIAGNOSIS — D509 Iron deficiency anemia, unspecified: Secondary | ICD-10-CM | POA: Diagnosis not present

## 2014-11-24 DIAGNOSIS — N2581 Secondary hyperparathyroidism of renal origin: Secondary | ICD-10-CM | POA: Diagnosis not present

## 2014-11-24 DIAGNOSIS — N186 End stage renal disease: Secondary | ICD-10-CM | POA: Diagnosis not present

## 2014-11-24 DIAGNOSIS — D631 Anemia in chronic kidney disease: Secondary | ICD-10-CM | POA: Diagnosis not present

## 2014-11-26 DIAGNOSIS — N2581 Secondary hyperparathyroidism of renal origin: Secondary | ICD-10-CM | POA: Diagnosis not present

## 2014-11-26 DIAGNOSIS — Z992 Dependence on renal dialysis: Secondary | ICD-10-CM | POA: Diagnosis not present

## 2014-11-26 DIAGNOSIS — N186 End stage renal disease: Secondary | ICD-10-CM | POA: Diagnosis not present

## 2014-11-26 DIAGNOSIS — N039 Chronic nephritic syndrome with unspecified morphologic changes: Secondary | ICD-10-CM | POA: Diagnosis not present

## 2014-11-26 DIAGNOSIS — D631 Anemia in chronic kidney disease: Secondary | ICD-10-CM | POA: Diagnosis not present

## 2014-11-26 DIAGNOSIS — D509 Iron deficiency anemia, unspecified: Secondary | ICD-10-CM | POA: Diagnosis not present

## 2014-11-29 DIAGNOSIS — N2581 Secondary hyperparathyroidism of renal origin: Secondary | ICD-10-CM | POA: Diagnosis not present

## 2014-11-29 DIAGNOSIS — N186 End stage renal disease: Secondary | ICD-10-CM | POA: Diagnosis not present

## 2014-11-29 DIAGNOSIS — Z23 Encounter for immunization: Secondary | ICD-10-CM | POA: Diagnosis not present

## 2014-11-29 DIAGNOSIS — D631 Anemia in chronic kidney disease: Secondary | ICD-10-CM | POA: Diagnosis not present

## 2014-11-29 DIAGNOSIS — D509 Iron deficiency anemia, unspecified: Secondary | ICD-10-CM | POA: Diagnosis not present

## 2014-12-01 DIAGNOSIS — N186 End stage renal disease: Secondary | ICD-10-CM | POA: Diagnosis not present

## 2014-12-01 DIAGNOSIS — D631 Anemia in chronic kidney disease: Secondary | ICD-10-CM | POA: Diagnosis not present

## 2014-12-01 DIAGNOSIS — D509 Iron deficiency anemia, unspecified: Secondary | ICD-10-CM | POA: Diagnosis not present

## 2014-12-01 DIAGNOSIS — Z23 Encounter for immunization: Secondary | ICD-10-CM | POA: Diagnosis not present

## 2014-12-01 DIAGNOSIS — N2581 Secondary hyperparathyroidism of renal origin: Secondary | ICD-10-CM | POA: Diagnosis not present

## 2014-12-03 DIAGNOSIS — D509 Iron deficiency anemia, unspecified: Secondary | ICD-10-CM | POA: Diagnosis not present

## 2014-12-03 DIAGNOSIS — N186 End stage renal disease: Secondary | ICD-10-CM | POA: Diagnosis not present

## 2014-12-03 DIAGNOSIS — Z23 Encounter for immunization: Secondary | ICD-10-CM | POA: Diagnosis not present

## 2014-12-03 DIAGNOSIS — N2581 Secondary hyperparathyroidism of renal origin: Secondary | ICD-10-CM | POA: Diagnosis not present

## 2014-12-03 DIAGNOSIS — D631 Anemia in chronic kidney disease: Secondary | ICD-10-CM | POA: Diagnosis not present

## 2014-12-06 DIAGNOSIS — N186 End stage renal disease: Secondary | ICD-10-CM | POA: Diagnosis not present

## 2014-12-06 DIAGNOSIS — N2581 Secondary hyperparathyroidism of renal origin: Secondary | ICD-10-CM | POA: Diagnosis not present

## 2014-12-06 DIAGNOSIS — D631 Anemia in chronic kidney disease: Secondary | ICD-10-CM | POA: Diagnosis not present

## 2014-12-06 DIAGNOSIS — Z23 Encounter for immunization: Secondary | ICD-10-CM | POA: Diagnosis not present

## 2014-12-06 DIAGNOSIS — D509 Iron deficiency anemia, unspecified: Secondary | ICD-10-CM | POA: Diagnosis not present

## 2014-12-07 ENCOUNTER — Other Ambulatory Visit: Payer: Self-pay | Admitting: Vascular Surgery

## 2014-12-08 ENCOUNTER — Other Ambulatory Visit
Admission: RE | Admit: 2014-12-08 | Discharge: 2014-12-08 | Disposition: A | Discharge status: Home / Self Care | Payer: Medicare Other | Source: Ambulatory Visit | Attending: Vascular Surgery | Admitting: Vascular Surgery

## 2014-12-08 DIAGNOSIS — D509 Iron deficiency anemia, unspecified: Secondary | ICD-10-CM | POA: Diagnosis not present

## 2014-12-08 DIAGNOSIS — Z23 Encounter for immunization: Secondary | ICD-10-CM | POA: Diagnosis not present

## 2014-12-08 DIAGNOSIS — N186 End stage renal disease: Secondary | ICD-10-CM | POA: Diagnosis present

## 2014-12-08 DIAGNOSIS — N2581 Secondary hyperparathyroidism of renal origin: Secondary | ICD-10-CM | POA: Diagnosis not present

## 2014-12-08 DIAGNOSIS — D631 Anemia in chronic kidney disease: Secondary | ICD-10-CM | POA: Diagnosis not present

## 2014-12-09 ENCOUNTER — Ambulatory Visit
Admission: RE | Admit: 2014-12-09 | Discharge: 2014-12-09 | Disposition: A | Discharge status: Home / Self Care | Payer: Medicare Other | Source: Ambulatory Visit | Attending: Vascular Surgery | Admitting: Vascular Surgery

## 2014-12-09 ENCOUNTER — Encounter
Admission: RE | Disposition: A | Discharge status: Home / Self Care | Payer: Self-pay | Source: Ambulatory Visit | Attending: Vascular Surgery

## 2014-12-09 DIAGNOSIS — Y832 Surgical operation with anastomosis, bypass or graft as the cause of abnormal reaction of the patient, or of later complication, without mention of misadventure at the time of the procedure: Secondary | ICD-10-CM | POA: Insufficient documentation

## 2014-12-09 DIAGNOSIS — Z992 Dependence on renal dialysis: Secondary | ICD-10-CM | POA: Diagnosis not present

## 2014-12-09 DIAGNOSIS — N186 End stage renal disease: Secondary | ICD-10-CM | POA: Insufficient documentation

## 2014-12-09 DIAGNOSIS — K219 Gastro-esophageal reflux disease without esophagitis: Secondary | ICD-10-CM | POA: Diagnosis not present

## 2014-12-09 DIAGNOSIS — T82858A Stenosis of vascular prosthetic devices, implants and grafts, initial encounter: Secondary | ICD-10-CM | POA: Insufficient documentation

## 2014-12-09 HISTORY — PX: PERIPHERAL VASCULAR CATHETERIZATION: SHX172C

## 2014-12-09 LAB — POTASSIUM: POTASSIUM: 3.4 mmol/L — AB (ref 3.5–5.1)

## 2014-12-09 SURGERY — A/V SHUNTOGRAM/FISTULAGRAM
Anesthesia: Moderate Sedation | Laterality: Right

## 2014-12-09 MED ORDER — HEPARIN SODIUM (PORCINE) 1000 UNIT/ML IJ SOLN
INTRAMUSCULAR | Status: AC
  Filled 2014-12-09: qty 1

## 2014-12-09 MED ORDER — LIDOCAINE-EPINEPHRINE (PF) 1 %-1:200000 IJ SOLN
INTRAMUSCULAR | Status: AC
  Filled 2014-12-09: qty 30

## 2014-12-09 MED ORDER — FENTANYL CITRATE (PF) 100 MCG/2ML IJ SOLN
INTRAMUSCULAR | Status: AC
  Filled 2014-12-09: qty 2

## 2014-12-09 MED ORDER — MORPHINE SULFATE (PF) 4 MG/ML IV SOLN: 2.0000 mg | INTRAVENOUS | Status: DC | PRN

## 2014-12-09 MED ORDER — PHENOL 1.4 % MT LIQD: 1.0000 | OROMUCOSAL | Status: DC | PRN

## 2014-12-09 MED ORDER — HYDRALAZINE HCL 20 MG/ML IJ SOLN: 5.0000 mg | INTRAMUSCULAR | Status: DC | PRN

## 2014-12-09 MED ORDER — LABETALOL HCL 5 MG/ML IV SOLN: 10.0000 mg | INTRAVENOUS | Status: DC | PRN

## 2014-12-09 MED ORDER — ONDANSETRON HCL 4 MG/2ML IJ SOLN: 4.0000 mg | Freq: Four times a day (QID) | INTRAMUSCULAR | Status: DC | PRN

## 2014-12-09 MED ORDER — LIDOCAINE-EPINEPHRINE (PF) 1 %-1:200000 IJ SOLN
INTRAMUSCULAR | Status: DC | PRN
  Administered 2014-12-09: 10 mL via INTRADERMAL

## 2014-12-09 MED ORDER — ACETAMINOPHEN 325 MG PO TABS: 325.0000 mg | ORAL_TABLET | ORAL | Status: DC | PRN

## 2014-12-09 MED ORDER — CHLORHEXIDINE GLUCONATE CLOTH 2 % EX PADS: 6.0000 | MEDICATED_PAD | Freq: Once | CUTANEOUS | Status: DC

## 2014-12-09 MED ORDER — ALUM & MAG HYDROXIDE-SIMETH 200-200-20 MG/5ML PO SUSP: 15.0000 mL | ORAL | Status: DC | PRN

## 2014-12-09 MED ORDER — SODIUM CHLORIDE 0.9 % IV SOLN
INTRAVENOUS | Status: DC
Start: 2014-12-09 — End: 2014-12-09
  Administered 2014-12-09 (×2): via INTRAVENOUS

## 2014-12-09 MED ORDER — MIDAZOLAM HCL 5 MG/5ML IJ SOLN
INTRAMUSCULAR | Status: AC
  Filled 2014-12-09: qty 5

## 2014-12-09 MED ORDER — FENTANYL CITRATE (PF) 100 MCG/2ML IJ SOLN
INTRAMUSCULAR | Status: DC | PRN
  Administered 2014-12-09: 50 ug via INTRAVENOUS

## 2014-12-09 MED ORDER — HEPARIN (PORCINE) IN NACL 2-0.9 UNIT/ML-% IJ SOLN
INTRAMUSCULAR | Status: AC
  Filled 2014-12-09: qty 1000

## 2014-12-09 MED ORDER — CLINDAMYCIN PHOSPHATE 300 MG/50ML IV SOLN
300.0000 mg | Freq: Once | INTRAVENOUS | Status: AC
  Administered 2014-12-09: 300 mg via INTRAVENOUS

## 2014-12-09 MED ORDER — MIDAZOLAM HCL 2 MG/2ML IJ SOLN
INTRAMUSCULAR | Status: DC | PRN
  Administered 2014-12-09: 2 mg via INTRAVENOUS

## 2014-12-09 MED ORDER — METOPROLOL TARTRATE 1 MG/ML IV SOLN: 2.0000 mg | INTRAVENOUS | Status: DC | PRN

## 2014-12-09 MED ORDER — GUAIFENESIN-DM 100-10 MG/5ML PO SYRP: 15.0000 mL | ORAL_SOLUTION | ORAL | Status: DC | PRN

## 2014-12-09 MED ORDER — OXYCODONE-ACETAMINOPHEN 5-325 MG PO TABS: 1.0000 | ORAL_TABLET | ORAL | Status: DC | PRN

## 2014-12-09 MED ORDER — CEFAZOLIN SODIUM 1-5 GM-% IV SOLN
INTRAVENOUS | Status: AC
  Filled 2014-12-09: qty 50

## 2014-12-09 MED ORDER — CLINDAMYCIN PHOSPHATE 300 MG/50ML IV SOLN
INTRAVENOUS | Status: AC
  Filled 2014-12-09: qty 50

## 2014-12-09 MED ORDER — IOHEXOL 300 MG/ML  SOLN
INTRAMUSCULAR | Status: DC | PRN
  Administered 2014-12-09: 25 mL via INTRAVENOUS

## 2014-12-09 MED ORDER — ACETAMINOPHEN 325 MG RE SUPP: 325.0000 mg | RECTAL | Status: DC | PRN

## 2014-12-09 MED ORDER — SODIUM CHLORIDE 0.9 % IV SOLN: 500.0000 mL | Freq: Once | INTRAVENOUS | Status: DC | PRN

## 2014-12-09 MED ORDER — HEPARIN SODIUM (PORCINE) 1000 UNIT/ML IJ SOLN
INTRAMUSCULAR | Status: DC | PRN
  Administered 2014-12-09: 3000 [IU] via INTRAVENOUS

## 2014-12-09 SURGICAL SUPPLY — 9 items
BALLN DORADO 7X60X80 (BALLOONS) ×3
BALLOON DORADO 7X60X80 (BALLOONS) ×2 IMPLANT
DEVICE PRESTO INFLATION (MISCELLANEOUS) ×3 IMPLANT
DRAPE BRACHIAL (DRAPES) ×3 IMPLANT
PACK ANGIOGRAPHY (CUSTOM PROCEDURE TRAY) ×3 IMPLANT
SHEATH BRITE TIP 6FRX5.5 (SHEATH) ×3 IMPLANT
SUT MNCRL AB 4-0 PS2 18 (SUTURE) ×3 IMPLANT
TOWEL OR 17X26 4PK STRL BLUE (TOWEL DISPOSABLE) ×3 IMPLANT
WIRE MAGIC TOR.035 180C (WIRE) ×3 IMPLANT

## 2014-12-09 NOTE — Discharge Instructions (Signed)
Fistulogram, Care After °Refer to this sheet in the next few weeks. These instructions provide you with information on caring for yourself after your procedure. Your health care provider may also give you more specific instructions. Your treatment has been planned according to current medical practices, but problems sometimes occur. Call your health care provider if you have any problems or questions after your procedure. °WHAT TO EXPECT AFTER THE PROCEDURE °After your procedure, it is typical to have the following: °· A small amount of discomfort in the area where the catheters were placed. °· A small amount of bruising around the fistula. °· Sleepiness and fatigue. °HOME CARE INSTRUCTIONS °· Rest at home for the day following your procedure. °· Do not drive or operate heavy machinery while taking pain medicine. °· Take medicines only as directed by your health care provider. °· Do not take baths, swim, or use a hot tub until your health care provider approves. You may shower 24 hours after the procedure or as directed by your health care provider. °· There are many different ways to close and cover an incision, including stitches, skin glue, and adhesive strips. Follow your health care provider's instructions on: °¨ Incision care. °¨ Bandage (dressing) changes and removal. °¨ Incision closure removal. °· Monitor your dialysis fistula carefully. °SEEK MEDICAL CARE IF: °· You have drainage, redness, swelling, or pain at your catheter site. °· You have a fever. °· You have chills. °SEEK IMMEDIATE MEDICAL CARE IF: °· You feel weak. °· You have trouble balancing. °· You have trouble moving your arms or legs. °· You have problems with your speech or vision. °· You can no longer feel a vibration or buzz when you put your fingers over your dialysis fistula. °· The limb that was used for the procedure: °¨ Swells. °¨ Is painful. °¨ Is cold. °¨ Is discolored, such as blue or pale white. °  °This information is not intended  to replace advice given to you by your health care provider. Make sure you discuss any questions you have with your health care provider. °  °Document Released: 06/29/2013 Document Reviewed: 06/29/2013 °Elsevier Interactive Patient Education ©2016 Elsevier Inc. ° °

## 2014-12-09 NOTE — Op Note (Signed)
[facility]    OPERATIVE NOTE   PROCEDURE: 1.  Right brachial artery to axillary vein arteriovenous graft cannulation under ultrasound guidance 2.  Right arm shuntogram 3.  Percutaneous transluminal angioplasty of venous access site with 7 mm diameter by 6 cm length high pressure angioplasty balloon  PRE-OPERATIVE DIAGNOSIS: 1. ESRD 2. Malfunctioning right brachial artery to axillary vein arteriovenous graft  POST-OPERATIVE DIAGNOSIS: same as above   SURGEON: Leotis Pain, MD  ANESTHESIA: local with MCS  ESTIMATED BLOOD LOSS: Minimal  FINDING(S): 1. 70-80% stenosis at venous access site  SPECIMEN(S):  None  CONTRAST: 25 cc  INDICATIONS: [name] Marks is a 59 y.o. female who presents with malfunctioning right brachial artery to axillary vein arteriovenous graft.  The patient is scheduled for right arm shuntogram.  The patient is aware the risks include but are not limited to: bleeding, infection, thrombosis of the cannulated access, and possible anaphylactic reaction to the contrast.  The patient is aware of the risks of the procedure and elects to proceed forward.  DESCRIPTION: After full informed written consent was obtained, the patient was brought back to the angiography suite and placed supine upon the angiography table.  The patient was connected to monitoring equipment.  The right arm was prepped and draped in the standard fashion for a percutaneous access intervention.  Under ultrasound guidance, the right brachial artery to axillary vein arteriovenous graft was cannulated with a micropuncture needle under direct ultrasound guidance and a permanent image was performed.  The microwire was advanced into the fistula and the needle was exchanged for the a microsheath.  I then upsized to a 6 Fr Sheath and imaging was performed.  Hand injections were completed to image the access including the central venous system. This demonstrated about a 70-80% stenosis at  the venous access site within the graft. The previous stent at the arterial access site was widely patent. The previous stent at the venous anastomosis was widely patent and the central venous circulation had no significant stenosis.  Based on the images, this patient will need intervention to the access site. I then gave the patient 3000 units of intravenous heparin.  I then crossed the stenosis with a Magic Tourqe wire.  Based on the imaging, a 7 mm x 6 cm  high pressure angioplasty balloon was selected.  The balloon was centered around the stenosis and inflated to 16 ATM for 1 minute(s).  On completion imaging, a 15-20 % residual stenosis was present.     Based on the completion imaging, no further intervention is necessary.  The wire and balloon were removed from the sheath.  A 4-0 Monocryl purse-string suture was sewn around the sheath.  The sheath was removed while tying down the suture.  A sterile bandage was applied to the puncture site.  COMPLICATIONS: None  CONDITION: Stable   Anne Sebring  12/09/2014 10:18 AM

## 2014-12-09 NOTE — H&P (Signed)
[facility] Admission History & Physical  MRN : [id number]  [name] Marks is a 59 y.o. (12/01/1955) female who presents with chief complaint of No chief complaint on file. Marland Kitchen  History of Present Illness: Patient is sent over from her dialysis access center for poor flow through her right arm access. This has been used as her access for a long time now and has done reasonably well. She denies any other complaints today.  Current Facility-Administered Medications  Medication Dose Route Frequency Provider Last Rate Last Dose  . 0.9 %  sodium chloride infusion   Intravenous Continuous Janalyn Harder Stegmayer, PA-C 10 mL/hr at 12/09/14 0825    . Chlorhexidine Gluconate Cloth 2 % PADS 6 each  6 each Topical Once [employer], PA-C      . clindamycin (CLEOCIN) 300 MG/50ML IVPB           . clindamycin (CLEOCIN) IVPB 300 mg  300 mg Intravenous Once Sela Hua, PA-C        Past Medical History  Diagnosis Date  . End stage renal disease on dialysis   . Complication of anesthesia     due to kidney disease  . GERD (gastroesophageal reflux disease)     Past Surgical History  Procedure Laterality Date  . Dg av dialysis graft declot or    . Tubal ligation  1983  . Dilatation & curettage/hysteroscopy with trueclear N/A 11/06/2012    Procedure: DILATATION & CURETTAGE/HYSTEROSCOPY WITH TRUECLEAR;  Surgeon: Terrance Mass, MD;  Location: [facility];  Service: Gynecology;  Laterality: N/A;  Truclear Resectoscopic Polypectomy   . Peripheral vascular catheterization N/A 09/14/2014    Procedure: A/V Shuntogram/Fistulagram;  Surgeon: Katha Cabal, MD;  Location: [facility];  Service: Cardiovascular;  Laterality: N/A;  . Peripheral vascular catheterization N/A 09/14/2014    Procedure: A/V Shunt Intervention;  Surgeon: Katha Cabal, MD;  Location: [facility];  Service: Cardiovascular;  Laterality: N/A;    Social History Social History   Substance Use Topics  . Smoking status: Never Smoker   . Smokeless tobacco: Never Used  . Alcohol Use: No    Family History Family History  Problem Relation Age of Onset  . Diabetes Mother   . Hypertension Mother   . Heart disease Mother   . Alcohol abuse Mother   . Kidney disease Mother   . Cancer Father     COLON  . Alcohol abuse Father   . Breast cancer Sister 71  . Hypertension Sister   . Kidney disease Sister   . Hypertension Son     Allergies  Allergen Reactions  . Contrast Media [Iodinated Diagnostic Agents] Anaphylaxis  . Ancef [Cefazolin Sodium] Nausea And Vomiting     REVIEW OF SYSTEMS (Negative unless checked)  Constitutional: [] Weight loss  [] Fever  [] Chills Cardiac: [] Chest pain   [] Chest pressure   [] Palpitations   [] Shortness of breath when laying flat   [] Shortness of breath at rest   [] Shortness of breath with exertion. Vascular:  [] Pain in legs with walking   [] Pain in legs at rest   [] Pain in legs when laying flat   [] Claudication   [] Pain in feet when walking  [] Pain in feet at rest  [] Pain in feet when laying flat   [] History of DVT   [] Phlebitis   [] Swelling in legs   [] Varicose veins   [] Non-healing ulcers Pulmonary:   [] Uses home oxygen   [] Productive cough   []   Hemoptysis   [] Wheeze  [] COPD   [] Asthma Neurologic:  [] Dizziness  [] Blackouts   [] Seizures   [] History of stroke   [] History of TIA  [] Aphasia   [] Temporary blindness   [] Dysphagia   [] Weakness or numbness in arms   [] Weakness or numbness in legs Musculoskeletal:  [] Arthritis   [] Joint swelling   [] Joint pain   [] Low back pain Hematologic:  [] Easy bruising  [] Easy bleeding   [] Hypercoagulable state   [] Anemic  [] Hepatitis Gastrointestinal:  [] Blood in stool   [] Vomiting blood  [] Gastroesophageal reflux/heartburn   [] Difficulty swallowing. Genitourinary:  [x] Chronic kidney disease   [] Difficult urination  [] Frequent urination  [] Burning with urination   [] Blood in urine Skin:  [] Rashes    [] Ulcers   [] Wounds Psychological:  [] History of anxiety   []  History of major depression.  Physical Examination  Filed Vitals:   12/09/14 0820  BP: 125/86  Pulse: 87  Temp: 97.8 F (36.6 C)  TempSrc: Oral  Resp: 20  Height: 5\' 1"  (1.549 m)  Weight: 102.513 kg (226 lb)  SpO2: 97%   Body mass index is 42.72 kg/(m^2). Gen: WD/WN, NAD Head: [place]/AT, No temporalis wasting. Prominent temp pulse not noted. Ear/Nose/Throat: Hearing grossly intact, nares w/o erythema or drainage, oropharynx w/o Erythema/Exudate,  Eyes: PERRLA, EOMI.  Neck: Supple, no nuchal rigidity.  No bruit or JVD.  Pulmonary:  Good air movement, clear to auscultation bilaterally, no use of accessory muscles.  Cardiac: RRR, normal S1, S2, no Murmurs, rubs or gallops. Vascular: Right arm access with bruit present, somewhat pulsatile in nature Vessel Right Left  Radial Palpable Palpable                                   Gastrointestinal: soft, non-tender/non-distended. No guarding/reflex.  Musculoskeletal: M/S 5/5 throughout.  Extremities without ischemic changes.  No deformity or atrophy.  Neurologic: CN 2-12 intact. Pain and light touch intact in extremities.  Symmetrical.  Speech is fluent. Motor exam as listed above. Psychiatric: Judgment intact, Mood & affect appropriate for pt's clinical situation. Dermatologic: No rashes or ulcers noted.  No cellulitis or open wounds. Lymph : No Cervical, Axillary, or Inguinal lymphadenopathy.     CBC Lab Results  Component Value Date   WBC 6.2 10/28/2014   HGB 11.6* 10/28/2014   HCT 34.8* 10/28/2014   MCV 89.9 10/28/2014   PLT 289 10/28/2014    BMET    Component Value Date/Time   NA 140 10/28/2014 0918   NA 139 12/17/2012   NA 138 07/04/2011 1058   K 3.4* 12/08/2014 1647   K 4.9 05/19/2012 0937   CL 96* 10/28/2014 0918   CL 103 07/04/2011 1058   CO2 30 10/28/2014 0918   CO2 29 07/04/2011 1058   GLUCOSE 93 10/28/2014 0918   GLUCOSE 91 07/04/2011  1058   BUN 18 10/28/2014 0918   BUN 34* 12/17/2012   BUN 18 07/04/2011 1058   CREATININE 5.85* 10/28/2014 0918   CREATININE 14.4* 12/17/2012   CREATININE 6.46* 07/04/2011 1058   CREATININE 5.5* 10/04/2009 1242   CALCIUM 9.6 10/28/2014 0918   CALCIUM 10.2* 07/04/2011 1058   GFRNONAA 7* 07/04/2011 1058   GFRAA 8* 07/04/2011 1058   CrCl cannot be calculated (Patient has no serum creatinine result on file.).  COAG No results found for: INR, PROTIME  Radiology No results found.    Assessment/Plan 1. ESRD. On HD but access not working well  2. Dysfunction of dialysis access. Poor flows with dialysis. For fistulogram today. Risks and benefits are discussed.   DEW,JASON, MD  12/09/2014 9:19 AM

## 2014-12-10 ENCOUNTER — Encounter: Payer: Self-pay | Admitting: Vascular Surgery

## 2014-12-10 DIAGNOSIS — N186 End stage renal disease: Secondary | ICD-10-CM | POA: Diagnosis not present

## 2014-12-10 DIAGNOSIS — Z23 Encounter for immunization: Secondary | ICD-10-CM | POA: Diagnosis not present

## 2014-12-10 DIAGNOSIS — N2581 Secondary hyperparathyroidism of renal origin: Secondary | ICD-10-CM | POA: Diagnosis not present

## 2014-12-10 DIAGNOSIS — D509 Iron deficiency anemia, unspecified: Secondary | ICD-10-CM | POA: Diagnosis not present

## 2014-12-10 DIAGNOSIS — D631 Anemia in chronic kidney disease: Secondary | ICD-10-CM | POA: Diagnosis not present

## 2014-12-13 DIAGNOSIS — Z23 Encounter for immunization: Secondary | ICD-10-CM | POA: Diagnosis not present

## 2014-12-13 DIAGNOSIS — D631 Anemia in chronic kidney disease: Secondary | ICD-10-CM | POA: Diagnosis not present

## 2014-12-13 DIAGNOSIS — N186 End stage renal disease: Secondary | ICD-10-CM | POA: Diagnosis not present

## 2014-12-13 DIAGNOSIS — N2581 Secondary hyperparathyroidism of renal origin: Secondary | ICD-10-CM | POA: Diagnosis not present

## 2014-12-13 DIAGNOSIS — D509 Iron deficiency anemia, unspecified: Secondary | ICD-10-CM | POA: Diagnosis not present

## 2014-12-15 DIAGNOSIS — Z23 Encounter for immunization: Secondary | ICD-10-CM | POA: Diagnosis not present

## 2014-12-15 DIAGNOSIS — N2581 Secondary hyperparathyroidism of renal origin: Secondary | ICD-10-CM | POA: Diagnosis not present

## 2014-12-15 DIAGNOSIS — D509 Iron deficiency anemia, unspecified: Secondary | ICD-10-CM | POA: Diagnosis not present

## 2014-12-15 DIAGNOSIS — N186 End stage renal disease: Secondary | ICD-10-CM | POA: Diagnosis not present

## 2014-12-15 DIAGNOSIS — D631 Anemia in chronic kidney disease: Secondary | ICD-10-CM | POA: Diagnosis not present

## 2014-12-15 DIAGNOSIS — Z992 Dependence on renal dialysis: Secondary | ICD-10-CM | POA: Insufficient documentation

## 2014-12-17 DIAGNOSIS — N2581 Secondary hyperparathyroidism of renal origin: Secondary | ICD-10-CM | POA: Diagnosis not present

## 2014-12-17 DIAGNOSIS — N186 End stage renal disease: Secondary | ICD-10-CM | POA: Diagnosis not present

## 2014-12-17 DIAGNOSIS — D509 Iron deficiency anemia, unspecified: Secondary | ICD-10-CM | POA: Diagnosis not present

## 2014-12-17 DIAGNOSIS — D631 Anemia in chronic kidney disease: Secondary | ICD-10-CM | POA: Diagnosis not present

## 2014-12-17 DIAGNOSIS — Z23 Encounter for immunization: Secondary | ICD-10-CM | POA: Diagnosis not present

## 2014-12-20 DIAGNOSIS — Z23 Encounter for immunization: Secondary | ICD-10-CM | POA: Diagnosis not present

## 2014-12-20 DIAGNOSIS — N186 End stage renal disease: Secondary | ICD-10-CM | POA: Diagnosis not present

## 2014-12-20 DIAGNOSIS — D509 Iron deficiency anemia, unspecified: Secondary | ICD-10-CM | POA: Diagnosis not present

## 2014-12-20 DIAGNOSIS — D631 Anemia in chronic kidney disease: Secondary | ICD-10-CM | POA: Diagnosis not present

## 2014-12-20 DIAGNOSIS — N2581 Secondary hyperparathyroidism of renal origin: Secondary | ICD-10-CM | POA: Diagnosis not present

## 2014-12-22 DIAGNOSIS — Z23 Encounter for immunization: Secondary | ICD-10-CM | POA: Diagnosis not present

## 2014-12-22 DIAGNOSIS — N2581 Secondary hyperparathyroidism of renal origin: Secondary | ICD-10-CM | POA: Diagnosis not present

## 2014-12-22 DIAGNOSIS — D631 Anemia in chronic kidney disease: Secondary | ICD-10-CM | POA: Diagnosis not present

## 2014-12-22 DIAGNOSIS — D509 Iron deficiency anemia, unspecified: Secondary | ICD-10-CM | POA: Diagnosis not present

## 2014-12-22 DIAGNOSIS — N186 End stage renal disease: Secondary | ICD-10-CM | POA: Diagnosis not present

## 2014-12-24 DIAGNOSIS — Z23 Encounter for immunization: Secondary | ICD-10-CM | POA: Diagnosis not present

## 2014-12-24 DIAGNOSIS — D631 Anemia in chronic kidney disease: Secondary | ICD-10-CM | POA: Diagnosis not present

## 2014-12-24 DIAGNOSIS — D509 Iron deficiency anemia, unspecified: Secondary | ICD-10-CM | POA: Diagnosis not present

## 2014-12-24 DIAGNOSIS — N2581 Secondary hyperparathyroidism of renal origin: Secondary | ICD-10-CM | POA: Diagnosis not present

## 2014-12-24 DIAGNOSIS — N186 End stage renal disease: Secondary | ICD-10-CM | POA: Diagnosis not present

## 2014-12-27 DIAGNOSIS — N2581 Secondary hyperparathyroidism of renal origin: Secondary | ICD-10-CM | POA: Diagnosis not present

## 2014-12-27 DIAGNOSIS — Z992 Dependence on renal dialysis: Secondary | ICD-10-CM | POA: Diagnosis not present

## 2014-12-27 DIAGNOSIS — N186 End stage renal disease: Secondary | ICD-10-CM | POA: Diagnosis not present

## 2014-12-27 DIAGNOSIS — N039 Chronic nephritic syndrome with unspecified morphologic changes: Secondary | ICD-10-CM | POA: Diagnosis not present

## 2014-12-27 DIAGNOSIS — D509 Iron deficiency anemia, unspecified: Secondary | ICD-10-CM | POA: Diagnosis not present

## 2014-12-27 DIAGNOSIS — D631 Anemia in chronic kidney disease: Secondary | ICD-10-CM | POA: Diagnosis not present

## 2014-12-27 DIAGNOSIS — Z23 Encounter for immunization: Secondary | ICD-10-CM | POA: Diagnosis not present

## 2014-12-29 DIAGNOSIS — D509 Iron deficiency anemia, unspecified: Secondary | ICD-10-CM | POA: Diagnosis not present

## 2014-12-29 DIAGNOSIS — D631 Anemia in chronic kidney disease: Secondary | ICD-10-CM | POA: Diagnosis not present

## 2014-12-29 DIAGNOSIS — N186 End stage renal disease: Secondary | ICD-10-CM | POA: Diagnosis not present

## 2014-12-29 DIAGNOSIS — N2581 Secondary hyperparathyroidism of renal origin: Secondary | ICD-10-CM | POA: Diagnosis not present

## 2014-12-31 DIAGNOSIS — D631 Anemia in chronic kidney disease: Secondary | ICD-10-CM | POA: Diagnosis not present

## 2014-12-31 DIAGNOSIS — D509 Iron deficiency anemia, unspecified: Secondary | ICD-10-CM | POA: Diagnosis not present

## 2014-12-31 DIAGNOSIS — N186 End stage renal disease: Secondary | ICD-10-CM | POA: Diagnosis not present

## 2014-12-31 DIAGNOSIS — N2581 Secondary hyperparathyroidism of renal origin: Secondary | ICD-10-CM | POA: Diagnosis not present

## 2015-01-03 DIAGNOSIS — D631 Anemia in chronic kidney disease: Secondary | ICD-10-CM | POA: Diagnosis not present

## 2015-01-03 DIAGNOSIS — D509 Iron deficiency anemia, unspecified: Secondary | ICD-10-CM | POA: Diagnosis not present

## 2015-01-03 DIAGNOSIS — N186 End stage renal disease: Secondary | ICD-10-CM | POA: Diagnosis not present

## 2015-01-03 DIAGNOSIS — N2581 Secondary hyperparathyroidism of renal origin: Secondary | ICD-10-CM | POA: Diagnosis not present

## 2015-01-05 DIAGNOSIS — N186 End stage renal disease: Secondary | ICD-10-CM | POA: Diagnosis not present

## 2015-01-05 DIAGNOSIS — N2581 Secondary hyperparathyroidism of renal origin: Secondary | ICD-10-CM | POA: Diagnosis not present

## 2015-01-05 DIAGNOSIS — D631 Anemia in chronic kidney disease: Secondary | ICD-10-CM | POA: Diagnosis not present

## 2015-01-05 DIAGNOSIS — D509 Iron deficiency anemia, unspecified: Secondary | ICD-10-CM | POA: Diagnosis not present

## 2015-01-07 DIAGNOSIS — D509 Iron deficiency anemia, unspecified: Secondary | ICD-10-CM | POA: Diagnosis not present

## 2015-01-07 DIAGNOSIS — D631 Anemia in chronic kidney disease: Secondary | ICD-10-CM | POA: Diagnosis not present

## 2015-01-07 DIAGNOSIS — N2581 Secondary hyperparathyroidism of renal origin: Secondary | ICD-10-CM | POA: Diagnosis not present

## 2015-01-07 DIAGNOSIS — N186 End stage renal disease: Secondary | ICD-10-CM | POA: Diagnosis not present

## 2015-01-10 DIAGNOSIS — N2581 Secondary hyperparathyroidism of renal origin: Secondary | ICD-10-CM | POA: Diagnosis not present

## 2015-01-10 DIAGNOSIS — D509 Iron deficiency anemia, unspecified: Secondary | ICD-10-CM | POA: Diagnosis not present

## 2015-01-10 DIAGNOSIS — N186 End stage renal disease: Secondary | ICD-10-CM | POA: Diagnosis not present

## 2015-01-10 DIAGNOSIS — D631 Anemia in chronic kidney disease: Secondary | ICD-10-CM | POA: Diagnosis not present

## 2015-01-12 DIAGNOSIS — D509 Iron deficiency anemia, unspecified: Secondary | ICD-10-CM | POA: Diagnosis not present

## 2015-01-12 DIAGNOSIS — N2581 Secondary hyperparathyroidism of renal origin: Secondary | ICD-10-CM | POA: Diagnosis not present

## 2015-01-12 DIAGNOSIS — N186 End stage renal disease: Secondary | ICD-10-CM | POA: Diagnosis not present

## 2015-01-12 DIAGNOSIS — D631 Anemia in chronic kidney disease: Secondary | ICD-10-CM | POA: Diagnosis not present

## 2015-01-14 DIAGNOSIS — N186 End stage renal disease: Secondary | ICD-10-CM | POA: Diagnosis not present

## 2015-01-14 DIAGNOSIS — N2581 Secondary hyperparathyroidism of renal origin: Secondary | ICD-10-CM | POA: Diagnosis not present

## 2015-01-14 DIAGNOSIS — D631 Anemia in chronic kidney disease: Secondary | ICD-10-CM | POA: Diagnosis not present

## 2015-01-14 DIAGNOSIS — D509 Iron deficiency anemia, unspecified: Secondary | ICD-10-CM | POA: Diagnosis not present

## 2015-01-17 DIAGNOSIS — D509 Iron deficiency anemia, unspecified: Secondary | ICD-10-CM | POA: Diagnosis not present

## 2015-01-17 DIAGNOSIS — D631 Anemia in chronic kidney disease: Secondary | ICD-10-CM | POA: Diagnosis not present

## 2015-01-17 DIAGNOSIS — N186 End stage renal disease: Secondary | ICD-10-CM | POA: Diagnosis not present

## 2015-01-17 DIAGNOSIS — N2581 Secondary hyperparathyroidism of renal origin: Secondary | ICD-10-CM | POA: Diagnosis not present

## 2015-01-18 DIAGNOSIS — I10 Essential (primary) hypertension: Secondary | ICD-10-CM | POA: Diagnosis not present

## 2015-01-18 DIAGNOSIS — T82318A Breakdown (mechanical) of other vascular grafts, initial encounter: Secondary | ICD-10-CM | POA: Diagnosis not present

## 2015-01-18 DIAGNOSIS — Y841 Kidney dialysis as the cause of abnormal reaction of the patient, or of later complication, without mention of misadventure at the time of the procedure: Secondary | ICD-10-CM | POA: Diagnosis not present

## 2015-01-18 DIAGNOSIS — N186 End stage renal disease: Secondary | ICD-10-CM | POA: Diagnosis not present

## 2015-01-18 DIAGNOSIS — Z992 Dependence on renal dialysis: Secondary | ICD-10-CM | POA: Diagnosis not present

## 2015-01-18 DIAGNOSIS — T82858A Stenosis of vascular prosthetic devices, implants and grafts, initial encounter: Secondary | ICD-10-CM | POA: Diagnosis not present

## 2015-01-19 DIAGNOSIS — D631 Anemia in chronic kidney disease: Secondary | ICD-10-CM | POA: Diagnosis not present

## 2015-01-19 DIAGNOSIS — N186 End stage renal disease: Secondary | ICD-10-CM | POA: Diagnosis not present

## 2015-01-19 DIAGNOSIS — D509 Iron deficiency anemia, unspecified: Secondary | ICD-10-CM | POA: Diagnosis not present

## 2015-01-19 DIAGNOSIS — N2581 Secondary hyperparathyroidism of renal origin: Secondary | ICD-10-CM | POA: Diagnosis not present

## 2015-01-22 DIAGNOSIS — N2581 Secondary hyperparathyroidism of renal origin: Secondary | ICD-10-CM | POA: Diagnosis not present

## 2015-01-22 DIAGNOSIS — D631 Anemia in chronic kidney disease: Secondary | ICD-10-CM | POA: Diagnosis not present

## 2015-01-22 DIAGNOSIS — D509 Iron deficiency anemia, unspecified: Secondary | ICD-10-CM | POA: Diagnosis not present

## 2015-01-22 DIAGNOSIS — N186 End stage renal disease: Secondary | ICD-10-CM | POA: Diagnosis not present

## 2015-01-24 DIAGNOSIS — N2581 Secondary hyperparathyroidism of renal origin: Secondary | ICD-10-CM | POA: Diagnosis not present

## 2015-01-24 DIAGNOSIS — D509 Iron deficiency anemia, unspecified: Secondary | ICD-10-CM | POA: Diagnosis not present

## 2015-01-24 DIAGNOSIS — D631 Anemia in chronic kidney disease: Secondary | ICD-10-CM | POA: Diagnosis not present

## 2015-01-24 DIAGNOSIS — N186 End stage renal disease: Secondary | ICD-10-CM | POA: Diagnosis not present

## 2015-01-26 DIAGNOSIS — N186 End stage renal disease: Secondary | ICD-10-CM | POA: Diagnosis not present

## 2015-01-26 DIAGNOSIS — D509 Iron deficiency anemia, unspecified: Secondary | ICD-10-CM | POA: Diagnosis not present

## 2015-01-26 DIAGNOSIS — Z992 Dependence on renal dialysis: Secondary | ICD-10-CM | POA: Diagnosis not present

## 2015-01-26 DIAGNOSIS — N2581 Secondary hyperparathyroidism of renal origin: Secondary | ICD-10-CM | POA: Diagnosis not present

## 2015-01-26 DIAGNOSIS — D631 Anemia in chronic kidney disease: Secondary | ICD-10-CM | POA: Diagnosis not present

## 2015-01-26 DIAGNOSIS — N039 Chronic nephritic syndrome with unspecified morphologic changes: Secondary | ICD-10-CM | POA: Diagnosis not present

## 2015-01-28 DIAGNOSIS — N186 End stage renal disease: Secondary | ICD-10-CM | POA: Diagnosis not present

## 2015-01-28 DIAGNOSIS — N2581 Secondary hyperparathyroidism of renal origin: Secondary | ICD-10-CM | POA: Diagnosis not present

## 2015-01-28 DIAGNOSIS — D631 Anemia in chronic kidney disease: Secondary | ICD-10-CM | POA: Diagnosis not present

## 2015-01-28 DIAGNOSIS — D509 Iron deficiency anemia, unspecified: Secondary | ICD-10-CM | POA: Diagnosis not present

## 2015-01-31 DIAGNOSIS — D509 Iron deficiency anemia, unspecified: Secondary | ICD-10-CM | POA: Diagnosis not present

## 2015-01-31 DIAGNOSIS — D631 Anemia in chronic kidney disease: Secondary | ICD-10-CM | POA: Diagnosis not present

## 2015-01-31 DIAGNOSIS — N186 End stage renal disease: Secondary | ICD-10-CM | POA: Diagnosis not present

## 2015-01-31 DIAGNOSIS — N2581 Secondary hyperparathyroidism of renal origin: Secondary | ICD-10-CM | POA: Diagnosis not present

## 2015-02-02 DIAGNOSIS — N2581 Secondary hyperparathyroidism of renal origin: Secondary | ICD-10-CM | POA: Diagnosis not present

## 2015-02-02 DIAGNOSIS — N186 End stage renal disease: Secondary | ICD-10-CM | POA: Diagnosis not present

## 2015-02-02 DIAGNOSIS — D509 Iron deficiency anemia, unspecified: Secondary | ICD-10-CM | POA: Diagnosis not present

## 2015-02-02 DIAGNOSIS — D631 Anemia in chronic kidney disease: Secondary | ICD-10-CM | POA: Diagnosis not present

## 2015-02-04 DIAGNOSIS — D509 Iron deficiency anemia, unspecified: Secondary | ICD-10-CM | POA: Diagnosis not present

## 2015-02-04 DIAGNOSIS — N2581 Secondary hyperparathyroidism of renal origin: Secondary | ICD-10-CM | POA: Diagnosis not present

## 2015-02-04 DIAGNOSIS — N186 End stage renal disease: Secondary | ICD-10-CM | POA: Diagnosis not present

## 2015-02-04 DIAGNOSIS — D631 Anemia in chronic kidney disease: Secondary | ICD-10-CM | POA: Diagnosis not present

## 2015-02-07 DIAGNOSIS — D631 Anemia in chronic kidney disease: Secondary | ICD-10-CM | POA: Diagnosis not present

## 2015-02-07 DIAGNOSIS — N2581 Secondary hyperparathyroidism of renal origin: Secondary | ICD-10-CM | POA: Diagnosis not present

## 2015-02-07 DIAGNOSIS — N186 End stage renal disease: Secondary | ICD-10-CM | POA: Diagnosis not present

## 2015-02-07 DIAGNOSIS — D509 Iron deficiency anemia, unspecified: Secondary | ICD-10-CM | POA: Diagnosis not present

## 2015-02-09 DIAGNOSIS — D509 Iron deficiency anemia, unspecified: Secondary | ICD-10-CM | POA: Diagnosis not present

## 2015-02-09 DIAGNOSIS — N2581 Secondary hyperparathyroidism of renal origin: Secondary | ICD-10-CM | POA: Diagnosis not present

## 2015-02-09 DIAGNOSIS — N186 End stage renal disease: Secondary | ICD-10-CM | POA: Diagnosis not present

## 2015-02-09 DIAGNOSIS — D631 Anemia in chronic kidney disease: Secondary | ICD-10-CM | POA: Diagnosis not present

## 2015-02-11 DIAGNOSIS — D509 Iron deficiency anemia, unspecified: Secondary | ICD-10-CM | POA: Diagnosis not present

## 2015-02-11 DIAGNOSIS — N186 End stage renal disease: Secondary | ICD-10-CM | POA: Diagnosis not present

## 2015-02-11 DIAGNOSIS — D631 Anemia in chronic kidney disease: Secondary | ICD-10-CM | POA: Diagnosis not present

## 2015-02-11 DIAGNOSIS — N2581 Secondary hyperparathyroidism of renal origin: Secondary | ICD-10-CM | POA: Diagnosis not present

## 2015-02-14 DIAGNOSIS — D631 Anemia in chronic kidney disease: Secondary | ICD-10-CM | POA: Diagnosis not present

## 2015-02-14 DIAGNOSIS — N2581 Secondary hyperparathyroidism of renal origin: Secondary | ICD-10-CM | POA: Diagnosis not present

## 2015-02-14 DIAGNOSIS — D509 Iron deficiency anemia, unspecified: Secondary | ICD-10-CM | POA: Diagnosis not present

## 2015-02-14 DIAGNOSIS — N186 End stage renal disease: Secondary | ICD-10-CM | POA: Diagnosis not present

## 2015-02-16 DIAGNOSIS — D509 Iron deficiency anemia, unspecified: Secondary | ICD-10-CM | POA: Diagnosis not present

## 2015-02-16 DIAGNOSIS — N186 End stage renal disease: Secondary | ICD-10-CM | POA: Diagnosis not present

## 2015-02-16 DIAGNOSIS — N2581 Secondary hyperparathyroidism of renal origin: Secondary | ICD-10-CM | POA: Diagnosis not present

## 2015-02-16 DIAGNOSIS — D631 Anemia in chronic kidney disease: Secondary | ICD-10-CM | POA: Diagnosis not present

## 2015-02-18 DIAGNOSIS — D631 Anemia in chronic kidney disease: Secondary | ICD-10-CM | POA: Diagnosis not present

## 2015-02-18 DIAGNOSIS — D509 Iron deficiency anemia, unspecified: Secondary | ICD-10-CM | POA: Diagnosis not present

## 2015-02-18 DIAGNOSIS — N2581 Secondary hyperparathyroidism of renal origin: Secondary | ICD-10-CM | POA: Diagnosis not present

## 2015-02-18 DIAGNOSIS — N186 End stage renal disease: Secondary | ICD-10-CM | POA: Diagnosis not present

## 2015-02-21 DIAGNOSIS — N186 End stage renal disease: Secondary | ICD-10-CM | POA: Diagnosis not present

## 2015-02-21 DIAGNOSIS — N2581 Secondary hyperparathyroidism of renal origin: Secondary | ICD-10-CM | POA: Diagnosis not present

## 2015-02-21 DIAGNOSIS — D631 Anemia in chronic kidney disease: Secondary | ICD-10-CM | POA: Diagnosis not present

## 2015-02-21 DIAGNOSIS — D509 Iron deficiency anemia, unspecified: Secondary | ICD-10-CM | POA: Diagnosis not present

## 2015-02-23 DIAGNOSIS — D631 Anemia in chronic kidney disease: Secondary | ICD-10-CM | POA: Diagnosis not present

## 2015-02-23 DIAGNOSIS — N2581 Secondary hyperparathyroidism of renal origin: Secondary | ICD-10-CM | POA: Diagnosis not present

## 2015-02-23 DIAGNOSIS — N186 End stage renal disease: Secondary | ICD-10-CM | POA: Diagnosis not present

## 2015-02-23 DIAGNOSIS — D509 Iron deficiency anemia, unspecified: Secondary | ICD-10-CM | POA: Diagnosis not present

## 2015-02-25 DIAGNOSIS — N2581 Secondary hyperparathyroidism of renal origin: Secondary | ICD-10-CM | POA: Diagnosis not present

## 2015-02-25 DIAGNOSIS — N186 End stage renal disease: Secondary | ICD-10-CM | POA: Diagnosis not present

## 2015-02-25 DIAGNOSIS — D631 Anemia in chronic kidney disease: Secondary | ICD-10-CM | POA: Diagnosis not present

## 2015-02-25 DIAGNOSIS — D509 Iron deficiency anemia, unspecified: Secondary | ICD-10-CM | POA: Diagnosis not present

## 2015-02-26 ENCOUNTER — Emergency Department (INDEPENDENT_AMBULATORY_CARE_PROVIDER_SITE_OTHER)
Admission: EM | Admit: 2015-02-26 | Discharge: 2015-02-26 | Disposition: A | Discharge status: Home / Self Care | Payer: Medicare Other | Source: Home / Self Care | Attending: Family Medicine | Admitting: Family Medicine

## 2015-02-26 ENCOUNTER — Encounter (HOSPITAL_COMMUNITY): Payer: Self-pay | Admitting: Emergency Medicine

## 2015-02-26 DIAGNOSIS — J069 Acute upper respiratory infection, unspecified: Secondary | ICD-10-CM | POA: Diagnosis not present

## 2015-02-26 DIAGNOSIS — N039 Chronic nephritic syndrome with unspecified morphologic changes: Secondary | ICD-10-CM | POA: Diagnosis not present

## 2015-02-26 DIAGNOSIS — N186 End stage renal disease: Secondary | ICD-10-CM | POA: Diagnosis not present

## 2015-02-26 DIAGNOSIS — Z992 Dependence on renal dialysis: Secondary | ICD-10-CM | POA: Diagnosis not present

## 2015-02-26 NOTE — ED Notes (Signed)
Here with worsening cough with greenish mucous, and chest tightness No Hx Asthma or lung disease Tried otc cough meds without relief

## 2015-02-26 NOTE — ED Provider Notes (Signed)
CSN: [id number]     Arrival date & time 02/26/15  1100 History   First MD Initiated Contact with Patient 02/26/15 1119     Chief Complaint  Patient presents with  . Cough   (Consider location/radiation/quality/duration/timing/severity/associated sxs/prior Treatment) HPI Comments: 59 year old female complaining of cough, anterior chest pain associated with a cough, runny nose and PND. Denies fever, earache or sore throat.  Patient is a 59 y.o. female presenting with cough.  Cough Associated symptoms: rhinorrhea   Associated symptoms: no chills, no fever and no rash     Past Medical History  Diagnosis Date  . End stage renal disease on dialysis ([place])   . Complication of anesthesia     due to kidney disease  . GERD (gastroesophageal reflux disease)    Past Surgical History  Procedure Laterality Date  . Dg av dialysis graft declot or    . Tubal ligation  1983  . Dilatation & curettage/hysteroscopy with trueclear N/A 11/06/2012    Procedure: DILATATION & CURETTAGE/HYSTEROSCOPY WITH TRUECLEAR;  Surgeon: Terrance Mass, MD;  Location: [facility];  Service: Gynecology;  Laterality: N/A;  Truclear Resectoscopic Polypectomy   . Peripheral vascular catheterization N/A 09/14/2014    Procedure: A/V Shuntogram/Fistulagram;  Surgeon: Katha Cabal, MD;  Location: [facility];  Service: Cardiovascular;  Laterality: N/A;  . Peripheral vascular catheterization N/A 09/14/2014    Procedure: A/V Shunt Intervention;  Surgeon: Katha Cabal, MD;  Location: [facility];  Service: Cardiovascular;  Laterality: N/A;  . Peripheral vascular catheterization Right 12/09/2014    Procedure: A/V Shuntogram/Fistulagram;  Surgeon: Algernon Huxley, MD;  Location: [facility];  Service: Cardiovascular;  Laterality: Right;  . Peripheral vascular catheterization N/A 12/09/2014    Procedure: A/V Shunt Intervention;  Surgeon: Algernon Huxley, MD;  Location: [facility];  Service:  Cardiovascular;  Laterality: N/A;   Family History  Problem Relation Age of Onset  . Diabetes Mother   . Hypertension Mother   . Heart disease Mother   . Alcohol abuse Mother   . Kidney disease Mother   . Cancer Father     COLON  . Alcohol abuse Father   . Breast cancer Sister 60  . Hypertension Sister   . Kidney disease Sister   . Hypertension Son    Social History  Substance Use Topics  . Smoking status: Never Smoker   . Smokeless tobacco: Never Used  . Alcohol Use: No   OB History    Gravida Para Term Preterm AB TAB SAB Ectopic Multiple Living   2 2        2      Review of Systems  Constitutional: Negative for fever, chills, activity change, appetite change and fatigue.  HENT: Positive for congestion, postnasal drip and rhinorrhea. Negative for facial swelling.   Eyes: Negative.   Respiratory: Positive for cough and chest tightness.        Occasional, mild shortness of breath.  Cardiovascular: Negative.   Gastrointestinal: Negative.   Musculoskeletal: Negative for neck pain and neck stiffness.  Skin: Negative for pallor and rash.  Neurological: Negative.     Allergies  Contrast media and Ancef  Home Medications   Prior to Admission medications   Medication Sig Start Date End Date Taking? Authorizing Provider  albuterol (PROVENTIL HFA;VENTOLIN HFA) 108 (90 BASE) MCG/ACT inhaler Inhale 2 puffs into the lungs every 6 (six) hours as needed for wheezing.    Historical Provider, MD  aspirin  EC 81 MG tablet Take 81 mg by mouth daily.    Historical Provider, MD  cetirizine (ZYRTEC) 10 MG tablet Take 1 tablet (10 mg total) by mouth daily. One tab daily for allergies 05/26/13   Billy Fischer, MD  cinacalcet (SENSIPAR) 60 MG tablet Take 60 mg by mouth daily.    Historical Provider, MD  Famotidine (PEPCID PO) Take 1 tablet by mouth daily as needed (indigestion).     Historical Provider, MD  fluticasone (FLONASE) 50 MCG/ACT nasal spray Place 1 spray into both nostrils 2 (two)  times daily. 05/26/13   Billy Fischer, MD  guaiFENesin-codeine 100-10 MG/5ML syrup Take 5 mLs by mouth at bedtime as needed for cough. Patient not taking: Reported on 09/09/2014 07/08/14   Gregor Hams, MD  ibuprofen (ADVIL,MOTRIN) 200 MG tablet Take 400 mg by mouth every 6 (six) hours as needed for pain.    Historical Provider, MD  ipratropium (ATROVENT) 0.06 % nasal spray Place 2 sprays into both nostrils 4 (four) times daily. 05/26/13   Billy Fischer, MD  midodrine (PROAMATINE) 10 MG tablet Take 10 mg by mouth 2 (two) times daily.     Historical Provider, MD  Multiple Vitamins-Minerals (PRORENAL VITAL) TABS Take 1 tablet by mouth daily.    Historical Provider, MD  sevelamer (RENVELA) 800 MG tablet Take 1,600 mg by mouth 3 (three) times daily with meals.     Historical Provider, MD  Tdap (BOOSTRIX) 5-2.5-18.5 LF-MCG/0.5 injection Inject 0.5 mLs into the muscle once. 10/28/14   Asiyah Cletis Media, MD  terconazole (TERAZOL 7) 0.4 % vaginal cream Place 1 applicator vaginally at bedtime. 09/09/14   Terrance Mass, MD  tinidazole [employer]) 500 MG tablet Take four tablets today and four tablets tomorrow at the same time 09/09/14   Terrance Mass, MD   Meds Ordered and Administered this Visit  Medications - No data to display  BP 134/75 mmHg  Pulse 78  Temp(Src) 98.1 F (36.7 C) (Oral)  Resp 20  SpO2 98% No data found.   Physical Exam  Constitutional: She is oriented to person, place, and time. She appears well-developed and well-nourished. No distress.  HENT:  Bilateral TMs are normal Oropharynx with clear PND, cobblestoning and minor erythema. No exudates.  Eyes: EOM are normal.  Neck: Normal range of motion. Neck supple.  Cardiovascular: Normal rate, regular rhythm and normal heart sounds.   Pulmonary/Chest: Effort normal and breath sounds normal. No respiratory distress. She has no wheezes. She has no rales.  Musculoskeletal: Normal range of motion. She exhibits no edema.   Lymphadenopathy:    She has no cervical adenopathy.  Neurological: She is alert and oriented to person, place, and time.  Skin: Skin is warm and dry. No rash noted.  Psychiatric: She has a normal mood and affect.  Nursing note and vitals reviewed.   ED Course  Procedures (including critical care time)  Labs Review Labs Reviewed - No data to display  Imaging Review No results found.   Visual Acuity Review  Right Eye Distance:   Left Eye Distance:   Bilateral Distance:    Right Eye Near:   Left Eye Near:    Bilateral Near:         MDM   1. URI (upper respiratory infection)    Upper Respiratory Infection, Adult 4 runny nose and drainage may take Allegra or Zyrtec or Claritin daily Use saline nasal spray frequently 4 nasal congestion may take Sudafed PE 10  mg every 6 hours as needed. No cough may take Robitussin-DM. If you develop persistent chest pain, increasing shortness of breath, fevers or chills medical attention promptly.   Janne Napoleon, NP 02/26/15 1141

## 2015-02-26 NOTE — Discharge Instructions (Signed)
Upper Respiratory Infection, Adult 4 runny nose and drainage may take Allegra or Zyrtec or Claritin daily Use saline nasal spray frequently 4 nasal congestion may take Sudafed PE 10 mg every 6 hours as needed. No cough may take Robitussin-DM. If you develop persistent chest pain, increasing shortness of breath, fevers or chills medical attention promptly. Most upper respiratory infections (URIs) are a viral infection of the air passages leading to the lungs. A URI affects the nose, throat, and upper air passages. The most common type of URI is nasopharyngitis and is typically referred to as "the common cold." URIs run their course and usually go away on their own. Most of the time, a URI does not require medical attention, but sometimes a bacterial infection in the upper airways can follow a viral infection. This is called a secondary infection. Sinus and middle ear infections are common types of secondary upper respiratory infections. Bacterial pneumonia can also complicate a URI. A URI can worsen asthma and chronic obstructive pulmonary disease (COPD). Sometimes, these complications can require emergency medical care and may be life threatening.  CAUSES Almost all URIs are caused by viruses. A virus is a type of germ and can spread from one person to another.  RISKS FACTORS You may be at risk for a URI if:   You smoke.   You have chronic heart or lung disease.  You have a weakened defense (immune) system.   You are very young or very old.   You have nasal allergies or asthma.  You work in crowded or poorly ventilated areas.  You work in health care facilities or schools. SIGNS AND SYMPTOMS  Symptoms typically develop 2-3 days after you come in contact with a cold virus. Most viral URIs last 7-10 days. However, viral URIs from the influenza virus (flu virus) can last 14-18 days and are typically more severe. Symptoms may include:   Runny or stuffy (congested) nose.   Sneezing.    Cough.   Sore throat.   Headache.   Fatigue.   Fever.   Loss of appetite.   Pain in your forehead, behind your eyes, and over your cheekbones (sinus pain).  Muscle aches.  DIAGNOSIS  Your health care provider may diagnose a URI by:  Physical exam.  Tests to check that your symptoms are not due to another condition such as:  Strep throat.  Sinusitis.  Pneumonia.  Asthma. TREATMENT  A URI goes away on its own with time. It cannot be cured with medicines, but medicines may be prescribed or recommended to relieve symptoms. Medicines may help:  Reduce your fever.  Reduce your cough.  Relieve nasal congestion. HOME CARE INSTRUCTIONS   Take medicines only as directed by your health care provider.   Gargle warm saltwater or take cough drops to comfort your throat as directed by your health care provider.  Use a warm mist humidifier or inhale steam from a shower to increase air moisture. This may make it easier to breathe.  Drink enough fluid to keep your urine clear or pale yellow.   Eat soups and other clear broths and maintain good nutrition.   Rest as needed.   Return to work when your temperature has returned to normal or as your health care provider advises. You may need to stay home longer to avoid infecting others. You can also use a face mask and careful hand washing to prevent spread of the virus.  Increase the usage of your inhaler if you have asthma.  Do not use any tobacco products, including cigarettes, chewing tobacco, or electronic cigarettes. If you need help quitting, ask your health care provider. PREVENTION  The best way to protect yourself from getting a cold is to practice good hygiene.   Avoid oral or hand contact with people with cold symptoms.   Wash your hands often if contact occurs.  There is no clear evidence that vitamin C, vitamin E, echinacea, or exercise reduces the chance of developing a cold. However, it is  always recommended to get plenty of rest, exercise, and practice good nutrition.  SEEK MEDICAL CARE IF:   You are getting worse rather than better.   Your symptoms are not controlled by medicine.   You have chills.  You have worsening shortness of breath.  You have brown or red mucus.  You have yellow or brown nasal discharge.  You have pain in your face, especially when you bend forward.  You have a fever.  You have swollen neck glands.  You have pain while swallowing.  You have white areas in the back of your throat. SEEK IMMEDIATE MEDICAL CARE IF:   You have severe or persistent:  Headache.  Ear pain.  Sinus pain.  Chest pain.  You have chronic lung disease and any of the following:  Wheezing.  Prolonged cough.  Coughing up blood.  A change in your usual mucus.  You have a stiff neck.  You have changes in your:  Vision.  Hearing.  Thinking.  Mood. MAKE SURE YOU:   Understand these instructions.  Will watch your condition.  Will get help right away if you are not doing well or get worse.   This information is not intended to replace advice given to you by your health care provider. Make sure you discuss any questions you have with your health care provider.   Document Released: 08/08/2000 Document Revised: 06/29/2014 Document Reviewed: 05/20/2013 Elsevier Interactive Patient Education [employer].

## 2015-02-27 DIAGNOSIS — Z9289 Personal history of other medical treatment: Secondary | ICD-10-CM

## 2015-02-27 HISTORY — DX: Personal history of other medical treatment: Z92.89

## 2015-02-28 DIAGNOSIS — N186 End stage renal disease: Secondary | ICD-10-CM | POA: Diagnosis not present

## 2015-02-28 DIAGNOSIS — D631 Anemia in chronic kidney disease: Secondary | ICD-10-CM | POA: Diagnosis not present

## 2015-02-28 DIAGNOSIS — N2581 Secondary hyperparathyroidism of renal origin: Secondary | ICD-10-CM | POA: Diagnosis not present

## 2015-02-28 DIAGNOSIS — D509 Iron deficiency anemia, unspecified: Secondary | ICD-10-CM | POA: Diagnosis not present

## 2015-03-02 DIAGNOSIS — N186 End stage renal disease: Secondary | ICD-10-CM | POA: Diagnosis not present

## 2015-03-02 DIAGNOSIS — D509 Iron deficiency anemia, unspecified: Secondary | ICD-10-CM | POA: Diagnosis not present

## 2015-03-02 DIAGNOSIS — N2581 Secondary hyperparathyroidism of renal origin: Secondary | ICD-10-CM | POA: Diagnosis not present

## 2015-03-02 DIAGNOSIS — D631 Anemia in chronic kidney disease: Secondary | ICD-10-CM | POA: Diagnosis not present

## 2015-03-04 DIAGNOSIS — D631 Anemia in chronic kidney disease: Secondary | ICD-10-CM | POA: Diagnosis not present

## 2015-03-04 DIAGNOSIS — N186 End stage renal disease: Secondary | ICD-10-CM | POA: Diagnosis not present

## 2015-03-04 DIAGNOSIS — N2581 Secondary hyperparathyroidism of renal origin: Secondary | ICD-10-CM | POA: Diagnosis not present

## 2015-03-04 DIAGNOSIS — D509 Iron deficiency anemia, unspecified: Secondary | ICD-10-CM | POA: Diagnosis not present

## 2015-03-07 DIAGNOSIS — D509 Iron deficiency anemia, unspecified: Secondary | ICD-10-CM | POA: Diagnosis not present

## 2015-03-07 DIAGNOSIS — N186 End stage renal disease: Secondary | ICD-10-CM | POA: Diagnosis not present

## 2015-03-07 DIAGNOSIS — N2581 Secondary hyperparathyroidism of renal origin: Secondary | ICD-10-CM | POA: Diagnosis not present

## 2015-03-07 DIAGNOSIS — D631 Anemia in chronic kidney disease: Secondary | ICD-10-CM | POA: Diagnosis not present

## 2015-03-09 DIAGNOSIS — N186 End stage renal disease: Secondary | ICD-10-CM | POA: Diagnosis not present

## 2015-03-09 DIAGNOSIS — D509 Iron deficiency anemia, unspecified: Secondary | ICD-10-CM | POA: Diagnosis not present

## 2015-03-09 DIAGNOSIS — N2581 Secondary hyperparathyroidism of renal origin: Secondary | ICD-10-CM | POA: Diagnosis not present

## 2015-03-09 DIAGNOSIS — D631 Anemia in chronic kidney disease: Secondary | ICD-10-CM | POA: Diagnosis not present

## 2015-03-11 DIAGNOSIS — N186 End stage renal disease: Secondary | ICD-10-CM | POA: Diagnosis not present

## 2015-03-11 DIAGNOSIS — D631 Anemia in chronic kidney disease: Secondary | ICD-10-CM | POA: Diagnosis not present

## 2015-03-11 DIAGNOSIS — N2581 Secondary hyperparathyroidism of renal origin: Secondary | ICD-10-CM | POA: Diagnosis not present

## 2015-03-11 DIAGNOSIS — D509 Iron deficiency anemia, unspecified: Secondary | ICD-10-CM | POA: Diagnosis not present

## 2015-03-14 DIAGNOSIS — D509 Iron deficiency anemia, unspecified: Secondary | ICD-10-CM | POA: Diagnosis not present

## 2015-03-14 DIAGNOSIS — D631 Anemia in chronic kidney disease: Secondary | ICD-10-CM | POA: Diagnosis not present

## 2015-03-14 DIAGNOSIS — N186 End stage renal disease: Secondary | ICD-10-CM | POA: Diagnosis not present

## 2015-03-14 DIAGNOSIS — N2581 Secondary hyperparathyroidism of renal origin: Secondary | ICD-10-CM | POA: Diagnosis not present

## 2015-03-16 DIAGNOSIS — D509 Iron deficiency anemia, unspecified: Secondary | ICD-10-CM | POA: Diagnosis not present

## 2015-03-16 DIAGNOSIS — N2581 Secondary hyperparathyroidism of renal origin: Secondary | ICD-10-CM | POA: Diagnosis not present

## 2015-03-16 DIAGNOSIS — N186 End stage renal disease: Secondary | ICD-10-CM | POA: Diagnosis not present

## 2015-03-16 DIAGNOSIS — D631 Anemia in chronic kidney disease: Secondary | ICD-10-CM | POA: Diagnosis not present

## 2015-03-18 DIAGNOSIS — N186 End stage renal disease: Secondary | ICD-10-CM | POA: Diagnosis not present

## 2015-03-18 DIAGNOSIS — N2581 Secondary hyperparathyroidism of renal origin: Secondary | ICD-10-CM | POA: Diagnosis not present

## 2015-03-18 DIAGNOSIS — D631 Anemia in chronic kidney disease: Secondary | ICD-10-CM | POA: Diagnosis not present

## 2015-03-18 DIAGNOSIS — D509 Iron deficiency anemia, unspecified: Secondary | ICD-10-CM | POA: Diagnosis not present

## 2015-03-21 DIAGNOSIS — N2581 Secondary hyperparathyroidism of renal origin: Secondary | ICD-10-CM | POA: Diagnosis not present

## 2015-03-21 DIAGNOSIS — N186 End stage renal disease: Secondary | ICD-10-CM | POA: Diagnosis not present

## 2015-03-21 DIAGNOSIS — D631 Anemia in chronic kidney disease: Secondary | ICD-10-CM | POA: Diagnosis not present

## 2015-03-21 DIAGNOSIS — D509 Iron deficiency anemia, unspecified: Secondary | ICD-10-CM | POA: Diagnosis not present

## 2015-03-23 DIAGNOSIS — N186 End stage renal disease: Secondary | ICD-10-CM | POA: Diagnosis not present

## 2015-03-23 DIAGNOSIS — D631 Anemia in chronic kidney disease: Secondary | ICD-10-CM | POA: Diagnosis not present

## 2015-03-23 DIAGNOSIS — N2581 Secondary hyperparathyroidism of renal origin: Secondary | ICD-10-CM | POA: Diagnosis not present

## 2015-03-23 DIAGNOSIS — D509 Iron deficiency anemia, unspecified: Secondary | ICD-10-CM | POA: Diagnosis not present

## 2015-03-25 DIAGNOSIS — D631 Anemia in chronic kidney disease: Secondary | ICD-10-CM | POA: Diagnosis not present

## 2015-03-25 DIAGNOSIS — N2581 Secondary hyperparathyroidism of renal origin: Secondary | ICD-10-CM | POA: Diagnosis not present

## 2015-03-25 DIAGNOSIS — N186 End stage renal disease: Secondary | ICD-10-CM | POA: Diagnosis not present

## 2015-03-25 DIAGNOSIS — D509 Iron deficiency anemia, unspecified: Secondary | ICD-10-CM | POA: Diagnosis not present

## 2015-03-28 DIAGNOSIS — D509 Iron deficiency anemia, unspecified: Secondary | ICD-10-CM | POA: Diagnosis not present

## 2015-03-28 DIAGNOSIS — D631 Anemia in chronic kidney disease: Secondary | ICD-10-CM | POA: Diagnosis not present

## 2015-03-28 DIAGNOSIS — N186 End stage renal disease: Secondary | ICD-10-CM | POA: Diagnosis not present

## 2015-03-28 DIAGNOSIS — N2581 Secondary hyperparathyroidism of renal origin: Secondary | ICD-10-CM | POA: Diagnosis not present

## 2015-03-29 DIAGNOSIS — N039 Chronic nephritic syndrome with unspecified morphologic changes: Secondary | ICD-10-CM | POA: Diagnosis not present

## 2015-03-29 DIAGNOSIS — N186 End stage renal disease: Secondary | ICD-10-CM | POA: Diagnosis not present

## 2015-03-29 DIAGNOSIS — Z992 Dependence on renal dialysis: Secondary | ICD-10-CM | POA: Diagnosis not present

## 2015-03-30 DIAGNOSIS — N186 End stage renal disease: Secondary | ICD-10-CM | POA: Diagnosis not present

## 2015-03-30 DIAGNOSIS — D631 Anemia in chronic kidney disease: Secondary | ICD-10-CM | POA: Diagnosis not present

## 2015-03-30 DIAGNOSIS — D509 Iron deficiency anemia, unspecified: Secondary | ICD-10-CM | POA: Diagnosis not present

## 2015-03-30 DIAGNOSIS — N2581 Secondary hyperparathyroidism of renal origin: Secondary | ICD-10-CM | POA: Diagnosis not present

## 2015-04-01 DIAGNOSIS — N186 End stage renal disease: Secondary | ICD-10-CM | POA: Diagnosis not present

## 2015-04-01 DIAGNOSIS — N2581 Secondary hyperparathyroidism of renal origin: Secondary | ICD-10-CM | POA: Diagnosis not present

## 2015-04-01 DIAGNOSIS — D509 Iron deficiency anemia, unspecified: Secondary | ICD-10-CM | POA: Diagnosis not present

## 2015-04-01 DIAGNOSIS — D631 Anemia in chronic kidney disease: Secondary | ICD-10-CM | POA: Diagnosis not present

## 2015-04-04 DIAGNOSIS — N2581 Secondary hyperparathyroidism of renal origin: Secondary | ICD-10-CM | POA: Diagnosis not present

## 2015-04-04 DIAGNOSIS — N186 End stage renal disease: Secondary | ICD-10-CM | POA: Diagnosis not present

## 2015-04-04 DIAGNOSIS — D509 Iron deficiency anemia, unspecified: Secondary | ICD-10-CM | POA: Diagnosis not present

## 2015-04-04 DIAGNOSIS — D631 Anemia in chronic kidney disease: Secondary | ICD-10-CM | POA: Diagnosis not present

## 2015-04-06 DIAGNOSIS — D631 Anemia in chronic kidney disease: Secondary | ICD-10-CM | POA: Diagnosis not present

## 2015-04-06 DIAGNOSIS — D509 Iron deficiency anemia, unspecified: Secondary | ICD-10-CM | POA: Diagnosis not present

## 2015-04-06 DIAGNOSIS — N186 End stage renal disease: Secondary | ICD-10-CM | POA: Diagnosis not present

## 2015-04-06 DIAGNOSIS — N2581 Secondary hyperparathyroidism of renal origin: Secondary | ICD-10-CM | POA: Diagnosis not present

## 2015-04-08 DIAGNOSIS — D509 Iron deficiency anemia, unspecified: Secondary | ICD-10-CM | POA: Diagnosis not present

## 2015-04-08 DIAGNOSIS — N186 End stage renal disease: Secondary | ICD-10-CM | POA: Diagnosis not present

## 2015-04-08 DIAGNOSIS — D631 Anemia in chronic kidney disease: Secondary | ICD-10-CM | POA: Diagnosis not present

## 2015-04-08 DIAGNOSIS — N2581 Secondary hyperparathyroidism of renal origin: Secondary | ICD-10-CM | POA: Diagnosis not present

## 2015-04-11 DIAGNOSIS — D509 Iron deficiency anemia, unspecified: Secondary | ICD-10-CM | POA: Diagnosis not present

## 2015-04-11 DIAGNOSIS — N186 End stage renal disease: Secondary | ICD-10-CM | POA: Diagnosis not present

## 2015-04-11 DIAGNOSIS — D631 Anemia in chronic kidney disease: Secondary | ICD-10-CM | POA: Diagnosis not present

## 2015-04-11 DIAGNOSIS — N2581 Secondary hyperparathyroidism of renal origin: Secondary | ICD-10-CM | POA: Diagnosis not present

## 2015-04-13 DIAGNOSIS — D631 Anemia in chronic kidney disease: Secondary | ICD-10-CM | POA: Diagnosis not present

## 2015-04-13 DIAGNOSIS — N186 End stage renal disease: Secondary | ICD-10-CM | POA: Diagnosis not present

## 2015-04-13 DIAGNOSIS — N2581 Secondary hyperparathyroidism of renal origin: Secondary | ICD-10-CM | POA: Diagnosis not present

## 2015-04-13 DIAGNOSIS — D509 Iron deficiency anemia, unspecified: Secondary | ICD-10-CM | POA: Diagnosis not present

## 2015-04-15 DIAGNOSIS — N2581 Secondary hyperparathyroidism of renal origin: Secondary | ICD-10-CM | POA: Diagnosis not present

## 2015-04-15 DIAGNOSIS — N186 End stage renal disease: Secondary | ICD-10-CM | POA: Diagnosis not present

## 2015-04-15 DIAGNOSIS — D631 Anemia in chronic kidney disease: Secondary | ICD-10-CM | POA: Diagnosis not present

## 2015-04-15 DIAGNOSIS — D509 Iron deficiency anemia, unspecified: Secondary | ICD-10-CM | POA: Diagnosis not present

## 2015-04-18 DIAGNOSIS — N2581 Secondary hyperparathyroidism of renal origin: Secondary | ICD-10-CM | POA: Diagnosis not present

## 2015-04-18 DIAGNOSIS — D509 Iron deficiency anemia, unspecified: Secondary | ICD-10-CM | POA: Diagnosis not present

## 2015-04-18 DIAGNOSIS — D631 Anemia in chronic kidney disease: Secondary | ICD-10-CM | POA: Diagnosis not present

## 2015-04-18 DIAGNOSIS — N186 End stage renal disease: Secondary | ICD-10-CM | POA: Diagnosis not present

## 2015-04-19 DIAGNOSIS — T82318A Breakdown (mechanical) of other vascular grafts, initial encounter: Secondary | ICD-10-CM | POA: Diagnosis not present

## 2015-04-19 DIAGNOSIS — T82858A Stenosis of vascular prosthetic devices, implants and grafts, initial encounter: Secondary | ICD-10-CM | POA: Diagnosis not present

## 2015-04-19 DIAGNOSIS — Y841 Kidney dialysis as the cause of abnormal reaction of the patient, or of later complication, without mention of misadventure at the time of the procedure: Secondary | ICD-10-CM | POA: Diagnosis not present

## 2015-04-19 DIAGNOSIS — I10 Essential (primary) hypertension: Secondary | ICD-10-CM | POA: Diagnosis not present

## 2015-04-19 DIAGNOSIS — Z992 Dependence on renal dialysis: Secondary | ICD-10-CM | POA: Diagnosis not present

## 2015-04-19 DIAGNOSIS — N186 End stage renal disease: Secondary | ICD-10-CM | POA: Diagnosis not present

## 2015-04-20 DIAGNOSIS — D509 Iron deficiency anemia, unspecified: Secondary | ICD-10-CM | POA: Diagnosis not present

## 2015-04-20 DIAGNOSIS — N186 End stage renal disease: Secondary | ICD-10-CM | POA: Diagnosis not present

## 2015-04-20 DIAGNOSIS — N2581 Secondary hyperparathyroidism of renal origin: Secondary | ICD-10-CM | POA: Diagnosis not present

## 2015-04-20 DIAGNOSIS — D631 Anemia in chronic kidney disease: Secondary | ICD-10-CM | POA: Diagnosis not present

## 2015-04-22 DIAGNOSIS — N2581 Secondary hyperparathyroidism of renal origin: Secondary | ICD-10-CM | POA: Diagnosis not present

## 2015-04-22 DIAGNOSIS — N186 End stage renal disease: Secondary | ICD-10-CM | POA: Diagnosis not present

## 2015-04-22 DIAGNOSIS — D631 Anemia in chronic kidney disease: Secondary | ICD-10-CM | POA: Diagnosis not present

## 2015-04-22 DIAGNOSIS — D509 Iron deficiency anemia, unspecified: Secondary | ICD-10-CM | POA: Diagnosis not present

## 2015-04-25 DIAGNOSIS — N2581 Secondary hyperparathyroidism of renal origin: Secondary | ICD-10-CM | POA: Diagnosis not present

## 2015-04-25 DIAGNOSIS — D509 Iron deficiency anemia, unspecified: Secondary | ICD-10-CM | POA: Diagnosis not present

## 2015-04-25 DIAGNOSIS — D631 Anemia in chronic kidney disease: Secondary | ICD-10-CM | POA: Diagnosis not present

## 2015-04-25 DIAGNOSIS — N186 End stage renal disease: Secondary | ICD-10-CM | POA: Diagnosis not present

## 2015-04-26 ENCOUNTER — Emergency Department (INDEPENDENT_AMBULATORY_CARE_PROVIDER_SITE_OTHER)
Admission: EM | Admit: 2015-04-26 | Discharge: 2015-04-26 | Disposition: A | Discharge status: Home / Self Care | Payer: Medicare Other | Source: Home / Self Care | Attending: Emergency Medicine | Admitting: Emergency Medicine

## 2015-04-26 ENCOUNTER — Encounter (HOSPITAL_COMMUNITY): Payer: Self-pay | Admitting: *Deleted

## 2015-04-26 DIAGNOSIS — N032 Chronic nephritic syndrome with diffuse membranous glomerulonephritis: Secondary | ICD-10-CM | POA: Diagnosis not present

## 2015-04-26 DIAGNOSIS — Z992 Dependence on renal dialysis: Secondary | ICD-10-CM | POA: Diagnosis not present

## 2015-04-26 DIAGNOSIS — H6982 Other specified disorders of Eustachian tube, left ear: Secondary | ICD-10-CM | POA: Diagnosis not present

## 2015-04-26 DIAGNOSIS — N186 End stage renal disease: Secondary | ICD-10-CM | POA: Diagnosis not present

## 2015-04-26 MED ORDER — FLUTICASONE PROPIONATE 50 MCG/ACT NA SUSP: 2.0000 | Freq: Every day | NASAL | Status: DC

## 2015-04-26 NOTE — ED Notes (Signed)
Pt  Reports  Symptoms  Of  Fullness     In  The   Left  Ear       Since  Last  Weds         Pt    Is sitting  Upright on  The  Exam table  In   No  Acute  Distress    Speaking  In  Complete  sentances

## 2015-04-26 NOTE — ED Provider Notes (Signed)
CSN: [id number]     Arrival date & time 04/26/15  1304 History   First MD Initiated Contact with Patient 04/26/15 1357     Chief Complaint  Patient presents with  . Ear Fullness   (Consider location/radiation/quality/duration/timing/severity/associated sxs/prior Treatment) Patient is a 60 y.o. female presenting with plugged ear sensation. The history is provided by the patient.  Ear Fullness This is a new problem. The current episode started 6 to 12 hours ago. The problem occurs constantly. The problem has not changed since onset.Nothing aggravates the symptoms. Nothing relieves the symptoms. She has tried nothing for the symptoms.    Past Medical History  Diagnosis Date  . End stage renal disease on dialysis ([place])   . Complication of anesthesia     due to kidney disease  . GERD (gastroesophageal reflux disease)    Past Surgical History  Procedure Laterality Date  . Dg av dialysis graft declot or    . Tubal ligation  1983  . Dilatation & curettage/hysteroscopy with trueclear N/A 11/06/2012    Procedure: DILATATION & CURETTAGE/HYSTEROSCOPY WITH TRUECLEAR;  Surgeon: Terrance Mass, MD;  Location: [facility];  Service: Gynecology;  Laterality: N/A;  Truclear Resectoscopic Polypectomy   . Peripheral vascular catheterization N/A 09/14/2014    Procedure: A/V Shuntogram/Fistulagram;  Surgeon: Katha Cabal, MD;  Location: [facility];  Service: Cardiovascular;  Laterality: N/A;  . Peripheral vascular catheterization N/A 09/14/2014    Procedure: A/V Shunt Intervention;  Surgeon: Katha Cabal, MD;  Location: [facility];  Service: Cardiovascular;  Laterality: N/A;  . Peripheral vascular catheterization Right 12/09/2014    Procedure: A/V Shuntogram/Fistulagram;  Surgeon: Algernon Huxley, MD;  Location: [facility];  Service: Cardiovascular;  Laterality: Right;  . Peripheral vascular catheterization N/A 12/09/2014    Procedure: A/V Shunt Intervention;  Surgeon: Algernon Huxley, MD;  Location: [facility];  Service: Cardiovascular;  Laterality: N/A;   Family History  Problem Relation Age of Onset  . Diabetes Mother   . Hypertension Mother   . Heart disease Mother   . Alcohol abuse Mother   . Kidney disease Mother   . Cancer Father     COLON  . Alcohol abuse Father   . Breast cancer Sister 73  . Hypertension Sister   . Kidney disease Sister   . Hypertension Son    Social History  Substance Use Topics  . Smoking status: Never Smoker   . Smokeless tobacco: Never Used  . Alcohol Use: No   OB History    Gravida Para Term Preterm AB TAB SAB Ectopic Multiple Living   2 2        2      Review of Systems  Constitutional: Negative.   HENT: Positive for ear discharge.   Eyes: Negative.   Cardiovascular: Negative.   Endocrine: Negative.   Genitourinary: Negative.   Neurological: Negative.   Hematological: Negative.     Allergies  Contrast media and Ancef  Home Medications   Prior to Admission medications   Medication Sig Start Date End Date Taking? Authorizing Provider  albuterol (PROVENTIL HFA;VENTOLIN HFA) 108 (90 BASE) MCG/ACT inhaler Inhale 2 puffs into the lungs every 6 (six) hours as needed for wheezing.    Historical Provider, MD  aspirin EC 81 MG tablet Take 81 mg by mouth daily.    Historical Provider, MD  cetirizine (ZYRTEC) 10 MG tablet Take 1 tablet (10 mg total) by mouth daily. One tab  daily for allergies 05/26/13   Billy Fischer, MD  cinacalcet (SENSIPAR) 60 MG tablet Take 60 mg by mouth daily.    Historical Provider, MD  Famotidine (PEPCID PO) Take 1 tablet by mouth daily as needed (indigestion).     Historical Provider, MD  fluticasone (FLONASE) 50 MCG/ACT nasal spray Place 1 spray into both nostrils 2 (two) times daily. 05/26/13   Billy Fischer, MD  guaiFENesin-codeine 100-10 MG/5ML syrup Take 5 mLs by mouth at bedtime as needed for cough. Patient not taking: Reported on 09/09/2014 07/08/14   Gregor Hams, MD  ibuprofen  (ADVIL,MOTRIN) 200 MG tablet Take 400 mg by mouth every 6 (six) hours as needed for pain.    Historical Provider, MD  ipratropium (ATROVENT) 0.06 % nasal spray Place 2 sprays into both nostrils 4 (four) times daily. 05/26/13   Billy Fischer, MD  midodrine (PROAMATINE) 10 MG tablet Take 10 mg by mouth 2 (two) times daily.     Historical Provider, MD  Multiple Vitamins-Minerals (PRORENAL VITAL) TABS Take 1 tablet by mouth daily.    Historical Provider, MD  sevelamer (RENVELA) 800 MG tablet Take 1,600 mg by mouth 3 (three) times daily with meals.     Historical Provider, MD  Tdap (BOOSTRIX) 5-2.5-18.5 LF-MCG/0.5 injection Inject 0.5 mLs into the muscle once. 10/28/14   Asiyah Cletis Media, MD  terconazole (TERAZOL 7) 0.4 % vaginal cream Place 1 applicator vaginally at bedtime. 09/09/14   Terrance Mass, MD  tinidazole [facility]) 500 MG tablet Take four tablets today and four tablets tomorrow at the same time 09/09/14   Terrance Mass, MD   Meds Ordered and Administered this Visit  Medications - No data to display  BP 119/69 mmHg  Pulse 67  Temp(Src) 98.3 F (36.8 C) (Oral)  Resp 16  SpO2 98% No data found.   Physical Exam  Constitutional: She appears well-developed and well-nourished.  HENT:  Head: Normocephalic.  Right Ear: External ear normal.  Left Ear: External ear normal.  Nose: Nose normal.  Eyes: Conjunctivae and EOM are normal. Pupils are equal, round, and reactive to light.  Neck: Normal range of motion.  Cardiovascular: Normal rate, regular rhythm and normal heart sounds.   Pulmonary/Chest: Effort normal.  Abdominal: Soft. Bowel sounds are normal.  Musculoskeletal: Normal range of motion.    ED Course  Procedures (including critical care time)  Labs Review Labs Reviewed - No data to display  Imaging Review No results found.   Visual Acuity Review  Right Eye Distance:   Left Eye Distance:   Bilateral Distance:    Right Eye Near:   Left Eye Near:    Bilateral  Near:         MDM  Eustachian tube dysfunction  Flonase NS 2 sprays per nostril qd  [place], FNP 04/26/15 1429

## 2015-04-26 NOTE — Discharge Instructions (Signed)
Barotitis Media Barotitis media is inflammation of your middle ear. This occurs when the auditory tube (eustachian tube) leading from the back of your nose (nasopharynx) to your eardrum is blocked. This blockage may result from a cold, environmental allergies, or an upper respiratory infection. Unresolved barotitis media may lead to damage or hearing loss (barotrauma), which may become permanent. HOME CARE INSTRUCTIONS   Use medicines as recommended by your health care provider. Over-the-counter medicines will help unblock the canal and can help during times of air travel.  Do not put anything into your ears to clean or unplug them. Eardrops will not be helpful.  Do not swim, dive, or fly until your health care provider says it is all right to do so. If these activities are necessary, chewing gum with frequent, forceful swallowing may help. It is also helpful to hold your nose and gently blow to pop your ears for equalizing pressure changes. This forces air into the eustachian tube.  Only take over-the-counter or prescription medicines for pain, discomfort, or fever as directed by your health care provider.  A decongestant may be helpful in decongesting the middle ear and make pressure equalization easier. SEEK MEDICAL CARE IF:  You experience a serious form of dizziness in which you feel as if the room is spinning and you feel nauseated (vertigo).  Your symptoms only involve one ear. SEEK IMMEDIATE MEDICAL CARE IF:   You develop a severe headache, dizziness, or severe ear pain.  You have bloody or pus-like drainage from your ears.  You develop a fever.  Your problems do not improve or become worse. MAKE SURE YOU:   Understand these instructions.  Will watch your condition.  Will get help right away if you are not doing well or get worse.   This information is not intended to replace advice given to you by your health care provider. Make sure you discuss any questions you have with  your health care provider.   Document Released: 02/10/2000 Document Revised: 12/03/2012 Document Reviewed: 09/09/2012 Elsevier Interactive Patient Education 2016 Elsevier Inc.  

## 2015-04-27 DIAGNOSIS — D631 Anemia in chronic kidney disease: Secondary | ICD-10-CM | POA: Diagnosis not present

## 2015-04-27 DIAGNOSIS — D509 Iron deficiency anemia, unspecified: Secondary | ICD-10-CM | POA: Diagnosis not present

## 2015-04-27 DIAGNOSIS — N186 End stage renal disease: Secondary | ICD-10-CM | POA: Diagnosis not present

## 2015-04-27 DIAGNOSIS — N2581 Secondary hyperparathyroidism of renal origin: Secondary | ICD-10-CM | POA: Diagnosis not present

## 2015-04-29 DIAGNOSIS — D509 Iron deficiency anemia, unspecified: Secondary | ICD-10-CM | POA: Diagnosis not present

## 2015-04-29 DIAGNOSIS — N186 End stage renal disease: Secondary | ICD-10-CM | POA: Diagnosis not present

## 2015-04-29 DIAGNOSIS — N2581 Secondary hyperparathyroidism of renal origin: Secondary | ICD-10-CM | POA: Diagnosis not present

## 2015-04-29 DIAGNOSIS — D631 Anemia in chronic kidney disease: Secondary | ICD-10-CM | POA: Diagnosis not present

## 2015-05-02 DIAGNOSIS — N186 End stage renal disease: Secondary | ICD-10-CM | POA: Diagnosis not present

## 2015-05-02 DIAGNOSIS — D509 Iron deficiency anemia, unspecified: Secondary | ICD-10-CM | POA: Diagnosis not present

## 2015-05-02 DIAGNOSIS — D631 Anemia in chronic kidney disease: Secondary | ICD-10-CM | POA: Diagnosis not present

## 2015-05-02 DIAGNOSIS — N2581 Secondary hyperparathyroidism of renal origin: Secondary | ICD-10-CM | POA: Diagnosis not present

## 2015-05-04 DIAGNOSIS — N186 End stage renal disease: Secondary | ICD-10-CM | POA: Diagnosis not present

## 2015-05-04 DIAGNOSIS — D509 Iron deficiency anemia, unspecified: Secondary | ICD-10-CM | POA: Diagnosis not present

## 2015-05-04 DIAGNOSIS — D631 Anemia in chronic kidney disease: Secondary | ICD-10-CM | POA: Diagnosis not present

## 2015-05-04 DIAGNOSIS — N2581 Secondary hyperparathyroidism of renal origin: Secondary | ICD-10-CM | POA: Diagnosis not present

## 2015-05-06 DIAGNOSIS — D509 Iron deficiency anemia, unspecified: Secondary | ICD-10-CM | POA: Diagnosis not present

## 2015-05-06 DIAGNOSIS — N2581 Secondary hyperparathyroidism of renal origin: Secondary | ICD-10-CM | POA: Diagnosis not present

## 2015-05-06 DIAGNOSIS — D631 Anemia in chronic kidney disease: Secondary | ICD-10-CM | POA: Diagnosis not present

## 2015-05-06 DIAGNOSIS — N186 End stage renal disease: Secondary | ICD-10-CM | POA: Diagnosis not present

## 2015-05-09 DIAGNOSIS — N2581 Secondary hyperparathyroidism of renal origin: Secondary | ICD-10-CM | POA: Diagnosis not present

## 2015-05-09 DIAGNOSIS — D509 Iron deficiency anemia, unspecified: Secondary | ICD-10-CM | POA: Diagnosis not present

## 2015-05-09 DIAGNOSIS — D631 Anemia in chronic kidney disease: Secondary | ICD-10-CM | POA: Diagnosis not present

## 2015-05-09 DIAGNOSIS — N186 End stage renal disease: Secondary | ICD-10-CM | POA: Diagnosis not present

## 2015-05-11 ENCOUNTER — Telehealth: Payer: Self-pay

## 2015-05-11 DIAGNOSIS — D631 Anemia in chronic kidney disease: Secondary | ICD-10-CM | POA: Diagnosis not present

## 2015-05-11 DIAGNOSIS — N186 End stage renal disease: Secondary | ICD-10-CM | POA: Diagnosis not present

## 2015-05-11 DIAGNOSIS — N2581 Secondary hyperparathyroidism of renal origin: Secondary | ICD-10-CM | POA: Diagnosis not present

## 2015-05-11 DIAGNOSIS — D509 Iron deficiency anemia, unspecified: Secondary | ICD-10-CM | POA: Diagnosis not present

## 2015-05-11 NOTE — Telephone Encounter (Signed)
rec'd request from [facility] to schedule Right Arm Shuntogram due to decreased access flow.  Shuntogram scheduled 05/24/15 @ 9:00 AM.  Will fax pt. Instructions to the [place].

## 2015-05-13 DIAGNOSIS — N2581 Secondary hyperparathyroidism of renal origin: Secondary | ICD-10-CM | POA: Diagnosis not present

## 2015-05-13 DIAGNOSIS — D509 Iron deficiency anemia, unspecified: Secondary | ICD-10-CM | POA: Diagnosis not present

## 2015-05-13 DIAGNOSIS — D631 Anemia in chronic kidney disease: Secondary | ICD-10-CM | POA: Diagnosis not present

## 2015-05-13 DIAGNOSIS — N186 End stage renal disease: Secondary | ICD-10-CM | POA: Diagnosis not present

## 2015-05-16 DIAGNOSIS — D509 Iron deficiency anemia, unspecified: Secondary | ICD-10-CM | POA: Diagnosis not present

## 2015-05-16 DIAGNOSIS — N186 End stage renal disease: Secondary | ICD-10-CM | POA: Diagnosis not present

## 2015-05-16 DIAGNOSIS — D631 Anemia in chronic kidney disease: Secondary | ICD-10-CM | POA: Diagnosis not present

## 2015-05-16 DIAGNOSIS — N2581 Secondary hyperparathyroidism of renal origin: Secondary | ICD-10-CM | POA: Diagnosis not present

## 2015-05-18 DIAGNOSIS — N186 End stage renal disease: Secondary | ICD-10-CM | POA: Diagnosis not present

## 2015-05-18 DIAGNOSIS — N2581 Secondary hyperparathyroidism of renal origin: Secondary | ICD-10-CM | POA: Diagnosis not present

## 2015-05-18 DIAGNOSIS — D509 Iron deficiency anemia, unspecified: Secondary | ICD-10-CM | POA: Diagnosis not present

## 2015-05-18 DIAGNOSIS — D631 Anemia in chronic kidney disease: Secondary | ICD-10-CM | POA: Diagnosis not present

## 2015-05-19 ENCOUNTER — Other Ambulatory Visit: Payer: Self-pay

## 2015-05-20 DIAGNOSIS — N2581 Secondary hyperparathyroidism of renal origin: Secondary | ICD-10-CM | POA: Diagnosis not present

## 2015-05-20 DIAGNOSIS — D631 Anemia in chronic kidney disease: Secondary | ICD-10-CM | POA: Diagnosis not present

## 2015-05-20 DIAGNOSIS — N186 End stage renal disease: Secondary | ICD-10-CM | POA: Diagnosis not present

## 2015-05-20 DIAGNOSIS — D509 Iron deficiency anemia, unspecified: Secondary | ICD-10-CM | POA: Diagnosis not present

## 2015-05-23 DIAGNOSIS — D509 Iron deficiency anemia, unspecified: Secondary | ICD-10-CM | POA: Diagnosis not present

## 2015-05-23 DIAGNOSIS — N2581 Secondary hyperparathyroidism of renal origin: Secondary | ICD-10-CM | POA: Diagnosis not present

## 2015-05-23 DIAGNOSIS — N186 End stage renal disease: Secondary | ICD-10-CM | POA: Diagnosis not present

## 2015-05-23 DIAGNOSIS — D631 Anemia in chronic kidney disease: Secondary | ICD-10-CM | POA: Diagnosis not present

## 2015-05-24 ENCOUNTER — Other Ambulatory Visit: Payer: Self-pay

## 2015-05-24 ENCOUNTER — Encounter (HOSPITAL_COMMUNITY)
Admission: RE | Disposition: A | Discharge status: Home / Self Care | Payer: Self-pay | Source: Ambulatory Visit | Attending: Surgery

## 2015-05-24 ENCOUNTER — Ambulatory Visit (HOSPITAL_COMMUNITY)
Admission: RE | Admit: 2015-05-24 | Discharge: 2015-05-24 | Disposition: A | Discharge status: Home / Self Care | Payer: Medicare Other | Source: Ambulatory Visit | Attending: Surgery | Admitting: Surgery

## 2015-05-24 DIAGNOSIS — Z8249 Family history of ischemic heart disease and other diseases of the circulatory system: Secondary | ICD-10-CM | POA: Insufficient documentation

## 2015-05-24 DIAGNOSIS — Z7982 Long term (current) use of aspirin: Secondary | ICD-10-CM | POA: Insufficient documentation

## 2015-05-24 DIAGNOSIS — Z992 Dependence on renal dialysis: Secondary | ICD-10-CM | POA: Insufficient documentation

## 2015-05-24 DIAGNOSIS — Y832 Surgical operation with anastomosis, bypass or graft as the cause of abnormal reaction of the patient, or of later complication, without mention of misadventure at the time of the procedure: Secondary | ICD-10-CM | POA: Insufficient documentation

## 2015-05-24 DIAGNOSIS — K219 Gastro-esophageal reflux disease without esophagitis: Secondary | ICD-10-CM | POA: Diagnosis not present

## 2015-05-24 DIAGNOSIS — N186 End stage renal disease: Secondary | ICD-10-CM | POA: Diagnosis not present

## 2015-05-24 DIAGNOSIS — T82858A Stenosis of vascular prosthetic devices, implants and grafts, initial encounter: Secondary | ICD-10-CM | POA: Insufficient documentation

## 2015-05-24 DIAGNOSIS — N185 Chronic kidney disease, stage 5: Secondary | ICD-10-CM | POA: Diagnosis not present

## 2015-05-24 DIAGNOSIS — T82898A Other specified complication of vascular prosthetic devices, implants and grafts, initial encounter: Secondary | ICD-10-CM | POA: Diagnosis not present

## 2015-05-24 HISTORY — PX: PERIPHERAL VASCULAR CATHETERIZATION: SHX172C

## 2015-05-24 LAB — POCT I-STAT, CHEM 8
BUN: 24 mg/dL — AB (ref 6–20)
CREATININE: 6.2 mg/dL — AB (ref 0.44–1.00)
Calcium, Ion: 0.99 mmol/L — ABNORMAL LOW (ref 1.13–1.30)
Chloride: 96 mmol/L — ABNORMAL LOW (ref 101–111)
Glucose, Bld: 95 mg/dL (ref 65–99)
HEMATOCRIT: 35 % — AB (ref 36.0–46.0)
HEMOGLOBIN: 11.9 g/dL — AB (ref 12.0–15.0)
POTASSIUM: 3.6 mmol/L (ref 3.5–5.1)
SODIUM: 141 mmol/L (ref 135–145)
TCO2: 34 mmol/L (ref 0–100)

## 2015-05-24 SURGERY — A/V SHUNTOGRAM/FISTULAGRAM
Anesthesia: LOCAL | Laterality: Right

## 2015-05-24 MED ORDER — METHYLPREDNISOLONE SODIUM SUCC 125 MG IJ SOLR
INTRAMUSCULAR | Status: AC
  Filled 2015-05-24: qty 4

## 2015-05-24 MED ORDER — METHYLPREDNISOLONE SODIUM SUCC 125 MG IJ SOLR
INTRAMUSCULAR | Status: AC
  Filled 2015-05-24: qty 2

## 2015-05-24 MED ORDER — LIDOCAINE HCL (PF) 1 % IJ SOLN
INTRAMUSCULAR | Status: DC | PRN
  Administered 2015-05-24: 3 mL via SUBCUTANEOUS

## 2015-05-24 MED ORDER — METHYLPREDNISOLONE SODIUM SUCC 125 MG IJ SOLR
125.0000 mg | INTRAMUSCULAR | Status: AC
Start: 2015-05-24 — End: 2015-05-24
  Administered 2015-05-24: 125 mg via INTRAVENOUS

## 2015-05-24 MED ORDER — HEPARIN (PORCINE) IN NACL 2-0.9 UNIT/ML-% IJ SOLN
INTRAMUSCULAR | Status: AC
  Filled 2015-05-24: qty 500

## 2015-05-24 MED ORDER — HEPARIN (PORCINE) IN NACL 2-0.9 UNIT/ML-% IJ SOLN
INTRAMUSCULAR | Status: DC | PRN
  Administered 2015-05-24: 500 mL

## 2015-05-24 MED ORDER — HYDRALAZINE HCL 20 MG/ML IJ SOLN: 5.0000 mg | INTRAMUSCULAR | Status: DC | PRN

## 2015-05-24 MED ORDER — ACETAMINOPHEN 325 MG RE SUPP
325.0000 mg | RECTAL | Status: DC | PRN
  Filled 2015-05-24: qty 2

## 2015-05-24 MED ORDER — DIPHENHYDRAMINE HCL 50 MG/ML IJ SOLN
25.0000 mg | INTRAMUSCULAR | Status: AC
  Administered 2015-05-24: 25 mg via INTRAVENOUS

## 2015-05-24 MED ORDER — DIPHENHYDRAMINE HCL 50 MG/ML IJ SOLN
INTRAMUSCULAR | Status: AC
  Filled 2015-05-24: qty 1

## 2015-05-24 MED ORDER — FAMOTIDINE IN NACL 20-0.9 MG/50ML-% IV SOLN
INTRAVENOUS | Status: AC
  Filled 2015-05-24: qty 50

## 2015-05-24 MED ORDER — FAMOTIDINE IN NACL 20-0.9 MG/50ML-% IV SOLN
20.0000 mg | INTRAVENOUS | Status: AC
  Administered 2015-05-24: 20 mg via INTRAVENOUS

## 2015-05-24 MED ORDER — SODIUM CHLORIDE 0.9% FLUSH: 3.0000 mL | INTRAVENOUS | Status: DC | PRN

## 2015-05-24 MED ORDER — IODIXANOL 320 MG/ML IV SOLN
INTRAVENOUS | Status: DC | PRN
  Administered 2015-05-24: 40 mL via INTRAVENOUS

## 2015-05-24 MED ORDER — LABETALOL HCL 5 MG/ML IV SOLN: 10.0000 mg | INTRAVENOUS | Status: DC | PRN

## 2015-05-24 MED ORDER — ALUM & MAG HYDROXIDE-SIMETH 200-200-20 MG/5ML PO SUSP
15.0000 mL | ORAL | Status: DC | PRN
  Filled 2015-05-24: qty 30

## 2015-05-24 MED ORDER — OXYCODONE HCL 5 MG PO TABS: 5.0000 mg | ORAL_TABLET | ORAL | Status: DC | PRN

## 2015-05-24 MED ORDER — ONDANSETRON HCL 4 MG/2ML IJ SOLN
4.0000 mg | Freq: Four times a day (QID) | INTRAMUSCULAR | Status: DC | PRN
Start: 2015-05-24 — End: 2015-05-24

## 2015-05-24 MED ORDER — LIDOCAINE HCL (PF) 1 % IJ SOLN
INTRAMUSCULAR | Status: AC
  Filled 2015-05-24: qty 30

## 2015-05-24 MED ORDER — DOCUSATE SODIUM 100 MG PO CAPS: 100.0000 mg | ORAL_CAPSULE | Freq: Every day | ORAL | Status: DC

## 2015-05-24 MED ORDER — ACETAMINOPHEN 325 MG PO TABS
325.0000 mg | ORAL_TABLET | ORAL | Status: DC | PRN
  Filled 2015-05-24: qty 2

## 2015-05-24 SURGICAL SUPPLY — 15 items
BAG SNAP BAND KOVER 36X36 (MISCELLANEOUS) ×2 IMPLANT
BALLN MUSTANG 10.0X40 75 (BALLOONS) ×2
BALLN MUSTANG 7.0X40 75 (BALLOONS) ×4
BALLOON MUSTANG 10.0X40 75 (BALLOONS) ×1 IMPLANT
BALLOON MUSTANG 7.0X40 75 (BALLOONS) ×2 IMPLANT
COVER DOME SNAP 22 D (MISCELLANEOUS) ×2 IMPLANT
COVER PRB 48X5XTLSCP FOLD TPE (BAG) ×1 IMPLANT
COVER PROBE 5X48 (BAG) ×1
KIT ENCORE 26 ADVANTAGE (KITS) ×2 IMPLANT
KIT MICROINTRODUCER STIFF 5F (SHEATH) ×2 IMPLANT
SHEATH PINNACLE R/O II 7F 4CM (SHEATH) ×2 IMPLANT
STOPCOCK MORSE 400PSI 3WAY (MISCELLANEOUS) ×2 IMPLANT
TRAY PV CATH (CUSTOM PROCEDURE TRAY) ×2 IMPLANT
TUBING CIL FLEX 10 FLL-RA (TUBING) ×2 IMPLANT
WIRE BENTSON .035X145CM (WIRE) ×4 IMPLANT

## 2015-05-24 NOTE — H&P (Addendum)
History and Physical  Patient name: [name] Marks MRN: [id number] DOB: 09/02/1955 Sex: female   Reason for Admission: No chief complaint on file.   HISTORY OF PRESENT ILLNESS: The patient suffers from end-stage renal disease.  There is a right arm graft in place.  She has undergone multiple interventions in the past at [facility].  She has had difficulty with flow rates recently and comes in for shuntogram  Past Medical History  Diagnosis Date  . End stage renal disease on dialysis ([place])   . Complication of anesthesia     due to kidney disease  . GERD (gastroesophageal reflux disease)     Past Surgical History  Procedure Laterality Date  . Dg av dialysis graft declot or    . Tubal ligation  1983  . Dilatation & curettage/hysteroscopy with trueclear N/A 11/06/2012    Procedure: DILATATION & CURETTAGE/HYSTEROSCOPY WITH TRUECLEAR;  Surgeon: Terrance Mass, MD;  Location: [facility];  Service: Gynecology;  Laterality: N/A;  Truclear Resectoscopic Polypectomy   . Peripheral vascular catheterization N/A 09/14/2014    Procedure: A/V Shuntogram/Fistulagram;  Surgeon: Katha Cabal, MD;  Location: [place] [facility];  Service: Cardiovascular;  Laterality: N/A;  . Peripheral vascular catheterization N/A 09/14/2014    Procedure: A/V Shunt Intervention;  Surgeon: Katha Cabal, MD;  Location: [place] [facility];  Service: Cardiovascular;  Laterality: N/A;  . Peripheral vascular catheterization Right 12/09/2014    Procedure: A/V Shuntogram/Fistulagram;  Surgeon: Algernon Huxley, MD;  Location: [place] [facility];  Service: Cardiovascular;  Laterality: Right;  . Peripheral vascular catheterization N/A 12/09/2014    Procedure: A/V Shunt Intervention;  Surgeon: Algernon Huxley, MD;  Location: [place] [facility];  Service: Cardiovascular;  Laterality: N/A;    Social History   Social History  . Marital Status: Single    Spouse Name: N/A  . Number of Children: N/A  . Years of  Education: N/A   Occupational History  . Not on file.   Social History Main Topics  . Smoking status: Never Smoker   . Smokeless tobacco: Never Used  . Alcohol Use: No  . Drug Use: No  . Sexual Activity: Not on file   Other Topics Concern  . Not on file   Social History Narrative    Family History  Problem Relation Age of Onset  . Diabetes Mother   . Hypertension Mother   . Heart disease Mother   . Alcohol abuse Mother   . Kidney disease Mother   . Cancer Father     COLON  . Alcohol abuse Father   . Breast cancer Sister 87  . Hypertension Sister   . Kidney disease Sister   . Hypertension Son     Allergies as of 05/11/2015 - Review Complete 04/26/2015  Allergen Reaction Noted  . Contrast media [iodinated diagnostic agents] Anaphylaxis 06/13/2011  . Ancef [cefazolin sodium] Nausea And Vomiting 05/23/2011    No current facility-administered medications on file prior to encounter.   Current Outpatient Prescriptions on File Prior to Encounter  Medication Sig Dispense Refill  . cinacalcet (SENSIPAR) 60 MG tablet Take 60 mg by mouth daily.    Marland Kitchen ibuprofen (ADVIL,MOTRIN) 200 MG tablet Take 400 mg by mouth every 6 (six) hours as needed for pain.    Marland Kitchen ipratropium (ATROVENT) 0.06 % nasal spray Place 2 sprays into both nostrils 4 (four) times daily. 15 mL 1  . midodrine (PROAMATINE) 10 MG  tablet Take 10 mg by mouth 2 (two) times daily.     . Multiple Vitamins-Minerals (PRORENAL VITAL) TABS Take 1 tablet by mouth daily.    . sevelamer (RENVELA) 800 MG tablet Take 1,600 mg by mouth 3 (three) times daily with meals.     Marland Kitchen albuterol (PROVENTIL HFA;VENTOLIN HFA) 108 (90 BASE) MCG/ACT inhaler Inhale 2 puffs into the lungs every 6 (six) hours as needed for wheezing.    Marland Kitchen aspirin EC 81 MG tablet Take 81 mg by mouth daily.    . cetirizine (ZYRTEC) 10 MG tablet Take 1 tablet (10 mg total) by mouth daily. One tab daily for allergies (Patient taking differently: Take 10 mg by mouth daily.  ) 30 tablet 1  . fluticasone (FLONASE) 50 MCG/ACT nasal spray Place 1 spray into both nostrils 2 (two) times daily. 1 g 2     REVIEW OF SYSTEMS: Cardiovascular: No chest pain,  Pulmonary: No productive cough,  Constitutional: No fever or chills.  PHYSICAL EXAMINATION: General: The patient appears their stated age.  Vital signs are BP 114/55 mmHg  Pulse 70  Temp(Src) 98.3 F (36.8 C) (Oral)  Resp 16  Ht 5\' 1"  (1.549 m)  Wt 225 lb (102.059 kg)  BMI 42.54 kg/m2  SpO2 100% HEENT:  No gross abnormalities Pulmonary: Respirations are non-labored Cardiovascular: Thrill in right AVGG    Assessment:  End-stage renal disease Plan: Shuntogram with intervention as indicated     V. Leia Alf, M.D. [facility] Office: [phone] Pager:  [phone]

## 2015-05-24 NOTE — Op Note (Signed)
    Patient name: [name] Marks MRN: [id number] DOB: 15-Jan-1956 Sex: female  05/24/2015 Pre-operative Diagnosis: End-stage renal disease Post-operative diagnosis:  Same Surgeon:  [name] Procedure Performed:  1.  Ultrasound-guided access, right upper arm AVGG  2.  Fistulogram  3.  Angioplasty, right subclavian vein (see central vein)  4.  Angioplasty right upper arm dialysis graft (peripheral vein)    Indications:  The patient is having trouble with dialysis.  She is here for fistulogram  Procedure:  The patient was identified in the holding area and taken to room 8.  The patient was then placed supine on the table and prepped and draped in the usual sterile fashion.  A time out was called.  Ultrasound was used to evaluate the fistula.  The vein was patent and compressible.  A digital ultrasound image was acquired.  The fistula was then accessed under ultrasound guidance using a micropuncture needle.  An 018 wire was then asvanced without resistance and a micropuncture sheath was placed.  Contrast injections were then performed through the sheath.  Findings:  Multiple stents are associated with the fistula all remaining widely patent.  There is a 70% stenosis within the midportion of the dialysis graft.  There are stents from the venous outflow tract into the central vein which are widely patent, however the distal portion of the last stent going into the subclavian vein has approximately a 70% stenosis.  The vena cava is widely patent.  The arterial venous anastomosis is widely patent.   Intervention:  After the above images were acquired the decision was made to proceed with intervention.  Over a 035 wire, a 7 French sheath was inserted.  No heparin was given.  I initially perform balloon angioplasty of the stenosis within the graft.  A 7 x 40 balloon was taken to 14 atm for 1 minute.  Completion imaging revealed resolution of the stenosis to less than 10%.  Next a 10 x 71 Mustang  balloon was inserted into the central venous system and angioplasty of the subclavian vein was performed at 14 atm for 1 minute.  Follow-up imaging revealed stenosis less than 10%.  Balloon and wire were removed.  A suture was used to close the cannulation site.  There are no immediate complications. Impression:  #1  balloon angioplasty of a mid graft stenosis using a 7 mm balloon.  A 70% stenosis was treated down to less than 10%  #2  balloon angioplasty of a central venous stenosis within the subclavian vein.  A 70% stenosis was treated down to less than 10% with a 10 mm balloon  #3  widely patent after venous anastomosis    V. [name], M.D. [facility] [place] Office: [phone] Pager:  [phone]

## 2015-05-24 NOTE — Discharge Instructions (Signed)
Fistulogram, Care After °Refer to this sheet in the next few weeks. These instructions provide you with information on caring for yourself after your procedure. Your health care provider may also give you more specific instructions. Your treatment has been planned according to current medical practices, but problems sometimes occur. Call your health care provider if you have any problems or questions after your procedure. °WHAT TO EXPECT AFTER THE PROCEDURE °After your procedure, it is typical to have the following: °· A small amount of discomfort in the area where the catheters were placed. °· A small amount of bruising around the fistula. °· Sleepiness and fatigue. °HOME CARE INSTRUCTIONS °· Rest at home for the day following your procedure. °· Do not drive or operate heavy machinery while taking pain medicine. °· Take medicines only as directed by your health care provider. °· Do not take baths, swim, or use a hot tub until your health care provider approves. You may shower 24 hours after the procedure or as directed by your health care provider. °· There are many different ways to close and cover an incision, including stitches, skin glue, and adhesive strips. Follow your health care provider's instructions on: °¨ Incision care. °¨ Bandage (dressing) changes and removal. °¨ Incision closure removal. °· Monitor your dialysis fistula carefully. °SEEK MEDICAL CARE IF: °· You have drainage, redness, swelling, or pain at your catheter site. °· You have a fever. °· You have chills. °SEEK IMMEDIATE MEDICAL CARE IF: °· You feel weak. °· You have trouble balancing. °· You have trouble moving your arms or legs. °· You have problems with your speech or vision. °· You can no longer feel a vibration or buzz when you put your fingers over your dialysis fistula. °· The limb that was used for the procedure: °¨ Swells. °¨ Is painful. °¨ Is cold. °¨ Is discolored, such as blue or pale white. °  °This information is not intended  to replace advice given to you by your health care provider. Make sure you discuss any questions you have with your health care provider. °  °Document Released: 06/29/2013 Document Reviewed: 06/29/2013 °Elsevier Interactive Patient Education ©2016 Elsevier Inc. ° °

## 2015-05-25 ENCOUNTER — Encounter (HOSPITAL_COMMUNITY): Payer: Self-pay | Admitting: Surgery

## 2015-05-25 DIAGNOSIS — N186 End stage renal disease: Secondary | ICD-10-CM | POA: Diagnosis not present

## 2015-05-25 DIAGNOSIS — D631 Anemia in chronic kidney disease: Secondary | ICD-10-CM | POA: Diagnosis not present

## 2015-05-25 DIAGNOSIS — D509 Iron deficiency anemia, unspecified: Secondary | ICD-10-CM | POA: Diagnosis not present

## 2015-05-25 DIAGNOSIS — N2581 Secondary hyperparathyroidism of renal origin: Secondary | ICD-10-CM | POA: Diagnosis not present

## 2015-05-27 DIAGNOSIS — D631 Anemia in chronic kidney disease: Secondary | ICD-10-CM | POA: Diagnosis not present

## 2015-05-27 DIAGNOSIS — N2581 Secondary hyperparathyroidism of renal origin: Secondary | ICD-10-CM | POA: Diagnosis not present

## 2015-05-27 DIAGNOSIS — N039 Chronic nephritic syndrome with unspecified morphologic changes: Secondary | ICD-10-CM | POA: Diagnosis not present

## 2015-05-27 DIAGNOSIS — Z992 Dependence on renal dialysis: Secondary | ICD-10-CM | POA: Diagnosis not present

## 2015-05-27 DIAGNOSIS — N186 End stage renal disease: Secondary | ICD-10-CM | POA: Diagnosis not present

## 2015-05-27 DIAGNOSIS — D509 Iron deficiency anemia, unspecified: Secondary | ICD-10-CM | POA: Diagnosis not present

## 2015-05-30 DIAGNOSIS — N186 End stage renal disease: Secondary | ICD-10-CM | POA: Diagnosis not present

## 2015-05-30 DIAGNOSIS — D631 Anemia in chronic kidney disease: Secondary | ICD-10-CM | POA: Diagnosis not present

## 2015-05-30 DIAGNOSIS — D509 Iron deficiency anemia, unspecified: Secondary | ICD-10-CM | POA: Diagnosis not present

## 2015-05-30 DIAGNOSIS — N2581 Secondary hyperparathyroidism of renal origin: Secondary | ICD-10-CM | POA: Diagnosis not present

## 2015-06-01 DIAGNOSIS — N186 End stage renal disease: Secondary | ICD-10-CM | POA: Diagnosis not present

## 2015-06-01 DIAGNOSIS — N2581 Secondary hyperparathyroidism of renal origin: Secondary | ICD-10-CM | POA: Diagnosis not present

## 2015-06-01 DIAGNOSIS — D509 Iron deficiency anemia, unspecified: Secondary | ICD-10-CM | POA: Diagnosis not present

## 2015-06-01 DIAGNOSIS — D631 Anemia in chronic kidney disease: Secondary | ICD-10-CM | POA: Diagnosis not present

## 2015-06-03 DIAGNOSIS — D509 Iron deficiency anemia, unspecified: Secondary | ICD-10-CM | POA: Diagnosis not present

## 2015-06-03 DIAGNOSIS — D631 Anemia in chronic kidney disease: Secondary | ICD-10-CM | POA: Diagnosis not present

## 2015-06-03 DIAGNOSIS — N2581 Secondary hyperparathyroidism of renal origin: Secondary | ICD-10-CM | POA: Diagnosis not present

## 2015-06-03 DIAGNOSIS — N186 End stage renal disease: Secondary | ICD-10-CM | POA: Diagnosis not present

## 2015-06-06 DIAGNOSIS — D631 Anemia in chronic kidney disease: Secondary | ICD-10-CM | POA: Diagnosis not present

## 2015-06-06 DIAGNOSIS — N2581 Secondary hyperparathyroidism of renal origin: Secondary | ICD-10-CM | POA: Diagnosis not present

## 2015-06-06 DIAGNOSIS — N186 End stage renal disease: Secondary | ICD-10-CM | POA: Diagnosis not present

## 2015-06-06 DIAGNOSIS — D509 Iron deficiency anemia, unspecified: Secondary | ICD-10-CM | POA: Diagnosis not present

## 2015-06-08 DIAGNOSIS — D509 Iron deficiency anemia, unspecified: Secondary | ICD-10-CM | POA: Diagnosis not present

## 2015-06-08 DIAGNOSIS — D631 Anemia in chronic kidney disease: Secondary | ICD-10-CM | POA: Diagnosis not present

## 2015-06-08 DIAGNOSIS — N186 End stage renal disease: Secondary | ICD-10-CM | POA: Diagnosis not present

## 2015-06-08 DIAGNOSIS — N2581 Secondary hyperparathyroidism of renal origin: Secondary | ICD-10-CM | POA: Diagnosis not present

## 2015-06-10 DIAGNOSIS — N2581 Secondary hyperparathyroidism of renal origin: Secondary | ICD-10-CM | POA: Diagnosis not present

## 2015-06-10 DIAGNOSIS — N186 End stage renal disease: Secondary | ICD-10-CM | POA: Diagnosis not present

## 2015-06-10 DIAGNOSIS — D509 Iron deficiency anemia, unspecified: Secondary | ICD-10-CM | POA: Diagnosis not present

## 2015-06-10 DIAGNOSIS — D631 Anemia in chronic kidney disease: Secondary | ICD-10-CM | POA: Diagnosis not present

## 2015-06-13 ENCOUNTER — Ambulatory Visit
Admission: AD | Admit: 2015-06-13 | Discharge: 2015-06-13 | Disposition: A | Discharge status: Home / Self Care | Payer: Medicare Other | Source: Ambulatory Visit | Attending: Vascular Surgery | Admitting: Vascular Surgery

## 2015-06-13 ENCOUNTER — Encounter: Payer: Self-pay | Admitting: *Deleted

## 2015-06-13 ENCOUNTER — Encounter
Admission: AD | Disposition: A | Discharge status: Home / Self Care | Payer: Self-pay | Source: Ambulatory Visit | Attending: Vascular Surgery

## 2015-06-13 ENCOUNTER — Other Ambulatory Visit: Payer: Self-pay | Admitting: Vascular Surgery

## 2015-06-13 DIAGNOSIS — N186 End stage renal disease: Secondary | ICD-10-CM | POA: Insufficient documentation

## 2015-06-13 DIAGNOSIS — Z803 Family history of malignant neoplasm of breast: Secondary | ICD-10-CM | POA: Diagnosis not present

## 2015-06-13 DIAGNOSIS — Z811 Family history of alcohol abuse and dependence: Secondary | ICD-10-CM | POA: Insufficient documentation

## 2015-06-13 DIAGNOSIS — Z90710 Acquired absence of both cervix and uterus: Secondary | ICD-10-CM | POA: Diagnosis not present

## 2015-06-13 DIAGNOSIS — T82318A Breakdown (mechanical) of other vascular grafts, initial encounter: Secondary | ICD-10-CM | POA: Diagnosis not present

## 2015-06-13 DIAGNOSIS — Z808 Family history of malignant neoplasm of other organs or systems: Secondary | ICD-10-CM | POA: Diagnosis not present

## 2015-06-13 DIAGNOSIS — Y832 Surgical operation with anastomosis, bypass or graft as the cause of abnormal reaction of the patient, or of later complication, without mention of misadventure at the time of the procedure: Secondary | ICD-10-CM | POA: Insufficient documentation

## 2015-06-13 DIAGNOSIS — Z8249 Family history of ischemic heart disease and other diseases of the circulatory system: Secondary | ICD-10-CM | POA: Diagnosis not present

## 2015-06-13 DIAGNOSIS — Z992 Dependence on renal dialysis: Secondary | ICD-10-CM | POA: Insufficient documentation

## 2015-06-13 DIAGNOSIS — Z881 Allergy status to other antibiotic agents status: Secondary | ICD-10-CM | POA: Diagnosis not present

## 2015-06-13 DIAGNOSIS — T82858A Stenosis of vascular prosthetic devices, implants and grafts, initial encounter: Secondary | ICD-10-CM | POA: Diagnosis not present

## 2015-06-13 DIAGNOSIS — M109 Gout, unspecified: Secondary | ICD-10-CM | POA: Diagnosis not present

## 2015-06-13 DIAGNOSIS — K219 Gastro-esophageal reflux disease without esophagitis: Secondary | ICD-10-CM | POA: Insufficient documentation

## 2015-06-13 DIAGNOSIS — I10 Essential (primary) hypertension: Secondary | ICD-10-CM | POA: Diagnosis not present

## 2015-06-13 DIAGNOSIS — I6523 Occlusion and stenosis of bilateral carotid arteries: Secondary | ICD-10-CM | POA: Diagnosis not present

## 2015-06-13 DIAGNOSIS — Y841 Kidney dialysis as the cause of abnormal reaction of the patient, or of later complication, without mention of misadventure at the time of the procedure: Secondary | ICD-10-CM | POA: Diagnosis not present

## 2015-06-13 DIAGNOSIS — Z9889 Other specified postprocedural states: Secondary | ICD-10-CM | POA: Diagnosis not present

## 2015-06-13 DIAGNOSIS — Z833 Family history of diabetes mellitus: Secondary | ICD-10-CM | POA: Diagnosis not present

## 2015-06-13 DIAGNOSIS — T82868A Thrombosis of vascular prosthetic devices, implants and grafts, initial encounter: Secondary | ICD-10-CM | POA: Diagnosis not present

## 2015-06-13 DIAGNOSIS — Z91041 Radiographic dye allergy status: Secondary | ICD-10-CM | POA: Insufficient documentation

## 2015-06-13 HISTORY — DX: Gout, unspecified: M10.9

## 2015-06-13 HISTORY — PX: PERIPHERAL VASCULAR CATHETERIZATION: SHX172C

## 2015-06-13 LAB — POTASSIUM (ARMC VASCULAR LAB ONLY): Potassium (ARMC vascular lab): 4.2 (ref 3.5–5.1)

## 2015-06-13 SURGERY — A/V SHUNTOGRAM/FISTULAGRAM
Anesthesia: Moderate Sedation

## 2015-06-13 MED ORDER — HEPARIN SODIUM (PORCINE) 1000 UNIT/ML IJ SOLN
INTRAMUSCULAR | Status: AC
  Filled 2015-06-13: qty 1

## 2015-06-13 MED ORDER — METHYLPREDNISOLONE SODIUM SUCC 125 MG IJ SOLR
125.0000 mg | INTRAMUSCULAR | Status: DC | PRN
  Administered 2015-06-13: 125 mg via INTRAVENOUS

## 2015-06-13 MED ORDER — IOPAMIDOL (ISOVUE-300) INJECTION 61%
INTRAVENOUS | Status: DC | PRN
  Administered 2015-06-13: 55 mL via INTRA_ARTERIAL

## 2015-06-13 MED ORDER — CLINDAMYCIN PHOSPHATE 300 MG/50ML IV SOLN: 300.0000 mg | Freq: Once | INTRAVENOUS | Status: DC

## 2015-06-13 MED ORDER — HYDROMORPHONE HCL 1 MG/ML IJ SOLN: 1.0000 mg | Freq: Once | INTRAMUSCULAR | Status: DC

## 2015-06-13 MED ORDER — ONDANSETRON HCL 4 MG/2ML IJ SOLN: 4.0000 mg | Freq: Four times a day (QID) | INTRAMUSCULAR | Status: DC | PRN

## 2015-06-13 MED ORDER — HEPARIN SODIUM (PORCINE) 1000 UNIT/ML IJ SOLN
INTRAMUSCULAR | Status: DC | PRN
  Administered 2015-06-13: 4000 [IU] via INTRAVENOUS

## 2015-06-13 MED ORDER — MIDAZOLAM HCL 5 MG/5ML IJ SOLN
INTRAMUSCULAR | Status: AC
  Filled 2015-06-13: qty 5

## 2015-06-13 MED ORDER — FENTANYL CITRATE (PF) 100 MCG/2ML IJ SOLN
INTRAMUSCULAR | Status: DC | PRN
  Administered 2015-06-13 (×2): 50 ug via INTRAVENOUS

## 2015-06-13 MED ORDER — FAMOTIDINE 20 MG PO TABS
ORAL_TABLET | ORAL | Status: AC
  Administered 2015-06-13: 40 mg via ORAL
  Filled 2015-06-13: qty 1

## 2015-06-13 MED ORDER — METHYLPREDNISOLONE SODIUM SUCC 125 MG IJ SOLR
INTRAMUSCULAR | Status: AC
  Administered 2015-06-13: 125 mg via INTRAVENOUS
  Filled 2015-06-13: qty 2

## 2015-06-13 MED ORDER — CLINDAMYCIN PHOSPHATE 300 MG/50ML IV SOLN
INTRAVENOUS | Status: AC
  Administered 2015-06-13: 14:00:00
  Filled 2015-06-13: qty 50

## 2015-06-13 MED ORDER — FENTANYL CITRATE (PF) 100 MCG/2ML IJ SOLN
INTRAMUSCULAR | Status: AC
  Filled 2015-06-13: qty 2

## 2015-06-13 MED ORDER — HEPARIN (PORCINE) IN NACL 2-0.9 UNIT/ML-% IJ SOLN
INTRAMUSCULAR | Status: AC
  Filled 2015-06-13: qty 1000

## 2015-06-13 MED ORDER — LIDOCAINE-EPINEPHRINE (PF) 1 %-1:200000 IJ SOLN
INTRAMUSCULAR | Status: AC
  Filled 2015-06-13: qty 30

## 2015-06-13 MED ORDER — LIDOCAINE-EPINEPHRINE (PF) 1 %-1:200000 IJ SOLN
INTRAMUSCULAR | Status: DC | PRN
  Administered 2015-06-13: 10 mL via INTRADERMAL

## 2015-06-13 MED ORDER — MIDAZOLAM HCL 2 MG/2ML IJ SOLN
INTRAMUSCULAR | Status: DC | PRN
  Administered 2015-06-13: 2 mg via INTRAVENOUS
  Administered 2015-06-13: 1 mg via INTRAVENOUS
  Administered 2015-06-13: 2 mg via INTRAVENOUS

## 2015-06-13 MED ORDER — ALTEPLASE 2 MG IJ SOLR
INTRAMUSCULAR | Status: DC | PRN
  Administered 2015-06-13: 4 mg

## 2015-06-13 MED ORDER — SODIUM CHLORIDE 0.9 % IV SOLN
INTRAVENOUS | Status: DC
  Administered 2015-06-13 (×2): via INTRAVENOUS

## 2015-06-13 MED ORDER — FAMOTIDINE 20 MG PO TABS
40.0000 mg | ORAL_TABLET | ORAL | Status: DC | PRN
  Administered 2015-06-13: 40 mg via ORAL

## 2015-06-13 MED ORDER — FAMOTIDINE 20 MG PO TABS
ORAL_TABLET | ORAL | Status: AC
  Filled 2015-06-13: qty 1

## 2015-06-13 SURGICAL SUPPLY — 15 items
BALLN DORADO 7X150X80 (BALLOONS) ×2
BALLOON DORADO 7X150X80 (BALLOONS) ×1 IMPLANT
CANNULA 5F STIFF (CANNULA) ×2 IMPLANT
CATH EMBOLECTOMY 5FR (BALLOONS) ×2 IMPLANT
CATH TORCON 5FR 0.38 (CATHETERS) ×2 IMPLANT
DEVICE PRESTO INFLATION (MISCELLANEOUS) ×2 IMPLANT
DRAPE BRACHIAL (DRAPES) ×2 IMPLANT
PACK ANGIOGRAPHY (CUSTOM PROCEDURE TRAY) ×2 IMPLANT
SET AVX THROMB ULT (MISCELLANEOUS) ×2 IMPLANT
SHEATH BRITE TIP 6FRX5.5 (SHEATH) ×4 IMPLANT
SHEATH BRITE TIP 7FRX5.5 (SHEATH) ×2 IMPLANT
STENT VIABAHN 8X100X120 (Permanent Stent) ×1 IMPLANT
STENT VIABAHN 8X10X120 (Permanent Stent) ×1 IMPLANT
TOWEL OR 17X26 4PK STRL BLUE (TOWEL DISPOSABLE) ×2 IMPLANT
WIRE MAGIC TOR.035 180C (WIRE) ×4 IMPLANT

## 2015-06-13 NOTE — OR Nursing (Signed)
Nurse care turned over from [facility] to [employer]

## 2015-06-13 NOTE — OR Nursing (Signed)
Pre med given for contrast allergy

## 2015-06-13 NOTE — Discharge Instructions (Signed)
Fistulogram, Care After °Refer to this sheet in the next few weeks. These instructions provide you with information on caring for yourself after your procedure. Your health care provider may also give you more specific instructions. Your treatment has been planned according to current medical practices, but problems sometimes occur. Call your health care provider if you have any problems or questions after your procedure. °WHAT TO EXPECT AFTER THE PROCEDURE °After your procedure, it is typical to have the following: °· A small amount of discomfort in the area where the catheters were placed. °· A small amount of bruising around the fistula. °· Sleepiness and fatigue. °HOME CARE INSTRUCTIONS °· Rest at home for the day following your procedure. °· Do not drive or operate heavy machinery while taking pain medicine. °· Take medicines only as directed by your health care provider. °· Do not take baths, swim, or use a hot tub until your health care provider approves. You may shower 24 hours after the procedure or as directed by your health care provider. °· There are many different ways to close and cover an incision, including stitches, skin glue, and adhesive strips. Follow your health care provider's instructions on: °¨ Incision care. °¨ Bandage (dressing) changes and removal. °¨ Incision closure removal. °· Monitor your dialysis fistula carefully. °SEEK MEDICAL CARE IF: °· You have drainage, redness, swelling, or pain at your catheter site. °· You have a fever. °· You have chills. °SEEK IMMEDIATE MEDICAL CARE IF: °· You feel weak. °· You have trouble balancing. °· You have trouble moving your arms or legs. °· You have problems with your speech or vision. °· You can no longer feel a vibration or buzz when you put your fingers over your dialysis fistula. °· The limb that was used for the procedure: °¨ Swells. °¨ Is painful. °¨ Is cold. °¨ Is discolored, such as blue or pale white. °  °This information is not intended  to replace advice given to you by your health care provider. Make sure you discuss any questions you have with your health care provider. °  °Document Released: 06/29/2013 Document Reviewed: 06/29/2013 °Elsevier Interactive Patient Education ©2016 Elsevier Inc. ° °

## 2015-06-13 NOTE — Op Note (Signed)
[place]    OPERATIVE NOTE   PROCEDURE: 1.  Right arm brachial artery to axillary vein arteriovenous graft cannulation under ultrasound guidance in both a retrograde and then antegrade fashion crossing 2.  Right arm shuntogram and central venogram 3.  Catheter directed thrombolysis with 4 mg of TPA delivered with the AngioJet AVX catheter 4.  Mechanical rheolytic thrombectomy to the graft and axillary vein with the AngioJet AVX catheter 5.  Fogarty embolectomy for residual arterial plug 6.  Percutaneous transluminal angioplasty of arterial anastomosis with 7 mm diameter angioplasty balloon 7.  Percutaneous transluminal angioplasty of the mid and distal graft and venous anastomosis with 7 mm diameter angioplasty balloon 8.  viabahn covered stent placement to the mid graft for residual stenosis and and thrombosis  PRE-OPERATIVE DIAGNOSIS: 1. ESRD 2.  Thrombosed right arm arteriovenous graft  POST-OPERATIVE DIAGNOSIS: same as above   SURGEON: Leotis Pain, MD  ANESTHESIA: local with Moderate Conscious Sedation for approximately 35 minutes using 5 mg of Versed and 100 mcg of Fentanyl  ESTIMATED BLOOD LOSS: 25 cc  FINDING(S): 1. Thrombosed graft  SPECIMEN(S):  None  CONTRAST: 55 cc  FLUORO TIME:  5.4 minutes  INDICATIONS: Patient is a 60 y.o.female who presents with a thrombosed right brachial artery to axillary vein arteriovenous graft.  The patient is scheduled for an attempted declot and shuntogram.  The patient is aware the risks include but are not limited to: bleeding, infection, thrombosis of the cannulated access, and possible anaphylactic reaction to the contrast.  The patient is aware of the risks of the procedure and elects to proceed forward.  DESCRIPTION: After full informed written consent was obtained, the patient was brought back to the angiography suite and placed supine upon the angiography table.  The patient was connected to monitoring  equipment. Moderate conscious sedation was administered with a face to face encounter with the patient throughout the procedure with my supervision of the RN administering medicines and monitoring the patient's vital signs, pulse oximetry, telemetry and mental status throughout from the start of the procedure until the patient was taken to the recovery room. The right arm was prepped and draped in the standard fashion for a percutaneous access intervention.  Under ultrasound guidance, the right brachial artery to axillary vein arteriovenous graft was cannulated with a micropuncture needle under direct ultrasound guidance due to the pulseless nature of the graft in both an antegrade and a retrograde fashion crossing, and permanent images were performed.  The microwire was advanced and the needle was exchanged for the a microsheath.  I then upsized to a 6 Fr Sheath and imaging was performed.  Hand injections were completed to image the access including the central venous system. This demonstrated no flow within the AV graft.  Based on the images, this patient will need extensive treatment to salvage the graft. I then gave the patient 4000 units of intravenous heparin.  I then placed a Magic torque wire into the brachial artery from the retrograde sheath and into the axillary vein from the antegrade sheath. 4 mg of TPA were deployed throughout the entirety of the graft and into the brachial vein. This was allowed to dwell for 10-15 minutes. Mechanical rheolytic thrombectomy was then performed throughout the graft and into the axillary vein. This uncovered thrombus and stenosis in the graft and in the stent across the venous anastomosis.  A residual arterial plug was also seen at the arterial anastomosis. An attempt to clear the arterial plug  was done with two passes of the Fogarty embolectomy balloon. Flow-limiting arterial plug remained, and I elected to treat this lesion with a 7 mm diameter x 15 cm length  angioplasty balloon. This resulted in resolution of the arterial plug, and clearance of the arterial side of the graft. The arterial outflow was seen to be intact through the radial and ulnar arteries as well on these images. The retrograde sheath was removed. I then turned my attention to the thrombus in the distal graft and the axillary vein. Mechanical rheolytic thrombectomy was performed. This resulted in improvement but not resolution.  I then elected to treat this with 7 mm diameter by 15 cm length angioplasty balloon used in the mid to distal graft and then across the venous anastomosis into the axillary vein with a second inflation. Both inflations were to approximate 12 atm for 1 minute. The venous anastomosis and stent into the axillary and subclavian vein was now cleared, but greater than 50% residual stenosis and thrombus was left in the mid graft. I then upsized to a 7 [place] sheath and exchanged for a 0.018 wire. An 8 mm diameter by 10 cm length Viabahn stent was then deployed in this portion of the graft and postdilated with a 7 mm balloon with an excellent angiographic completion result and no significant residual stenosis.    Based on the completion imaging, no further intervention is necessary.  The wire and balloon were removed from the sheath.  A 4-0 Monocryl purse-string suture was sewn around the sheath.  The sheath was removed while tying down the suture.  A sterile bandage was applied to the puncture site.  COMPLICATIONS: None  CONDITION: Stable   DEW,JASON 06/13/2015 2:30 PM

## 2015-06-13 NOTE — H&P (Signed)
[facility] Admission History & Physical  MRN : [id number]  [name] Marks is a 60 y.o. (1955-02-28) female who presents with chief complaint of No chief complaint on file. Marland Kitchen  History of Present Illness: Patient is a 60 yo female with ESRD and sent from dialysis center for a clotted graft.  She has no other complaints. Required multiple previous interventions.    Current Facility-Administered Medications  Medication Dose Route Frequency Provider Last Rate Last Dose  . 0.9 %  sodium chloride infusion   Intravenous Continuous Kimberly A Stegmayer, PA-C 10 mL/hr at 06/13/15 1025    . clindamycin (CLEOCIN) 300 MG/50ML IVPB           . clindamycin (CLEOCIN) IVPB 300 mg  300 mg Intravenous Once [employer], PA-C      . famotidine (PEPCID) 20 MG tablet           . famotidine (PEPCID) tablet 40 mg  40 mg Oral PRN Janalyn Harder Stegmayer, PA-C   40 mg at 06/13/15 1034  . HYDROmorphone (DILAUDID) injection 1 mg  1 mg Intravenous Once [employer], PA-C      . methylPREDNISolone sodium succinate (SOLU-MEDROL) 125 mg/2 mL injection 125 mg  125 mg Intravenous PRN Janalyn Harder Stegmayer, PA-C   125 mg at 06/13/15 1026  . ondansetron (ZOFRAN) injection 4 mg  4 mg Intravenous Q6H PRN Sela Hua, PA-C        Past Medical History  Diagnosis Date  . End stage renal disease on dialysis ([place])   . Complication of anesthesia     due to kidney disease  . GERD (gastroesophageal reflux disease)   . Gout     Past Surgical History  Procedure Laterality Date  . Dg av dialysis graft declot or    . Tubal ligation  1983  . Dilatation & curettage/hysteroscopy with trueclear N/A 11/06/2012    Procedure: DILATATION & CURETTAGE/HYSTEROSCOPY WITH TRUECLEAR;  Surgeon: Terrance Mass, MD;  Location: [facility];  Service: Gynecology;  Laterality: N/A;  Truclear Resectoscopic Polypectomy   . Peripheral vascular catheterization N/A 09/14/2014    Procedure: A/V  Shuntogram/Fistulagram;  Surgeon: Katha Cabal, MD;  Location: [place] [facility];  Service: Cardiovascular;  Laterality: N/A;  . Peripheral vascular catheterization N/A 09/14/2014    Procedure: A/V Shunt Intervention;  Surgeon: Katha Cabal, MD;  Location: [place] [facility];  Service: Cardiovascular;  Laterality: N/A;  . Peripheral vascular catheterization Right 12/09/2014    Procedure: A/V Shuntogram/Fistulagram;  Surgeon: Algernon Huxley, MD;  Location: [place] [facility];  Service: Cardiovascular;  Laterality: Right;  . Peripheral vascular catheterization N/A 12/09/2014    Procedure: A/V Shunt Intervention;  Surgeon: Algernon Huxley, MD;  Location: [place] [facility];  Service: Cardiovascular;  Laterality: N/A;  . Peripheral vascular catheterization Right 05/24/2015    Procedure: A/V Shuntogram;  Surgeon: Serafina Mitchell, MD;  Location: [place] [facility];  Service: Cardiovascular;  Laterality: Right;  . Peripheral vascular catheterization Right 05/24/2015    Procedure: Peripheral Vascular Balloon Angioplasty;  Surgeon: Serafina Mitchell, MD;  Location: [place] [facility];  Service: Cardiovascular;  Laterality: Right;  right arm shunt    Social History Social History  Substance Use Topics  . Smoking status: Never Smoker   . Smokeless tobacco: Never Used  . Alcohol Use: No  No IVDU  Family History Family History  Problem Relation Age of Onset  . Diabetes  Mother   . Hypertension Mother   . Heart disease Mother   . Alcohol abuse Mother   . Kidney disease Mother   . Cancer Father     COLON  . Alcohol abuse Father   . Breast cancer Sister 66  . Hypertension Sister   . Kidney disease Sister   . Hypertension Son     Allergies  Allergen Reactions  . Contrast Media [Iodinated Diagnostic Agents] Anaphylaxis  . Ancef [Cefazolin Sodium] Nausea And Vomiting     REVIEW OF SYSTEMS (Negative unless checked)  Constitutional: [] Weight loss  [] Fever  [] Chills Cardiac: [] Chest  pain   [] Chest pressure   [] Palpitations   [] Shortness of breath when laying flat   [] Shortness of breath at rest   [] Shortness of breath with exertion. Vascular:  [] Pain in legs with walking   [] Pain in legs at rest   [] Pain in legs when laying flat   [] Claudication   [] Pain in feet when walking  [] Pain in feet at rest  [] Pain in feet when laying flat   [] History of DVT   [] Phlebitis   [] Swelling in legs   [] Varicose veins   [] Non-healing ulcers Pulmonary:   [] Uses home oxygen   [] Productive cough   [] Hemoptysis   [] Wheeze  [] COPD   [] Asthma Neurologic:  [] Dizziness  [] Blackouts   [] Seizures   [] History of stroke   [] History of TIA  [] Aphasia   [] Temporary blindness   [] Dysphagia   [] Weakness or numbness in arms   [] Weakness or numbness in legs Musculoskeletal:  [] Arthritis   [] Joint swelling   [] Joint pain   [] Low back pain Hematologic:  [] Easy bruising  [] Easy bleeding   [] Hypercoagulable state   [] Anemic  [] Hepatitis Gastrointestinal:  [] Blood in stool   [] Vomiting blood  [] Gastroesophageal reflux/heartburn   [] Difficulty swallowing. Genitourinary:  [x] Chronic kidney disease   [] Difficult urination  [] Frequent urination  [] Burning with urination   [] Blood in urine Skin:  [] Rashes   [] Ulcers   [] Wounds Psychological:  [] History of anxiety   []  History of major depression.  Physical Examination  Filed Vitals:   06/13/15 1003  BP: 166/86  Pulse: 60  Temp: 96.4 F (35.8 C)  TempSrc: Oral  Resp: 16  Height: 5\' 1"  (1.549 m)  Weight: 104.327 kg (230 lb)  SpO2: 95%   Body mass index is 43.48 kg/(m^2). Gen: WD/WN, NAD Head: [place]/AT, No temporalis wasting. Prominent temp pulse not noted. Ear/Nose/Throat: Hearing grossly intact, nares w/o erythema or drainage, oropharynx w/o Erythema/Exudate,  Eyes: PERRLA, EOMI.  Neck: Supple, no nuchal rigidity.  No JVD.  Pulmonary:  Good air movement, equal bilaterally, no use of accessory muscles.  Cardiac: RRR, normal S1, S2 Vascular: no bruit or thrill  in AVG Vessel Right Left  Radial Palpable Palpable                                   Gastrointestinal: soft, non-tender/non-distended. No guarding/reflex.  Musculoskeletal: M/S 5/5 throughout.  Extremities without ischemic changes.  No deformity or atrophy.  Neurologic: CN 2-12 intact. Pain and light touch intact in extremities.  Symmetrical.  Speech is fluent. Motor exam as listed above. Psychiatric: Judgment intact, Mood & affect appropriate for pt's clinical situation. Dermatologic: No rashes or ulcers noted.  No cellulitis or open wounds. Lymph : No Cervical, Axillary, or Inguinal lymphadenopathy.     CBC Lab Results  Component Value Date   WBC 6.2  10/28/2014   HGB 11.9* 05/24/2015   HCT 35.0* 05/24/2015   MCV 89.9 10/28/2014   PLT 289 10/28/2014    BMET    Component Value Date/Time   NA 141 05/24/2015 0743   NA 139 12/17/2012   NA 138 07/04/2011 1058   K 3.6 05/24/2015 0743   K 4.9 05/19/2012 0937   CL 96* 05/24/2015 0743   CL 103 07/04/2011 1058   CO2 30 10/28/2014 0918   CO2 29 07/04/2011 1058   GLUCOSE 95 05/24/2015 0743   GLUCOSE 91 07/04/2011 1058   BUN 24* 05/24/2015 0743   BUN 34* 12/17/2012   BUN 18 07/04/2011 1058   CREATININE 6.20* 05/24/2015 0743   CREATININE 5.85* 10/28/2014 0918   CREATININE 14.4* 12/17/2012   CREATININE 6.46* 07/04/2011 1058   CALCIUM 9.6 10/28/2014 0918   CALCIUM 10.2* 07/04/2011 1058   GFRNONAA 7* 07/04/2011 1058   GFRAA 8* 07/04/2011 1058   Estimated Creatinine Clearance: 10.7 mL/min (by C-G formula based on Cr of 6.2).  COAG No results found for: INR, PROTIME  Radiology No results found.    Assessment/Plan 1. ESRD with non-functional access.  Will attempt salvage today.  If not successful, will place a catheter. 2. HTN. Stable. On outpatient medications.    Jasmin Trumbull, MD  06/13/2015 12:10 PM

## 2015-06-14 ENCOUNTER — Encounter: Payer: Self-pay | Admitting: Vascular Surgery

## 2015-06-14 DIAGNOSIS — D509 Iron deficiency anemia, unspecified: Secondary | ICD-10-CM | POA: Diagnosis not present

## 2015-06-14 DIAGNOSIS — N2581 Secondary hyperparathyroidism of renal origin: Secondary | ICD-10-CM | POA: Diagnosis not present

## 2015-06-14 DIAGNOSIS — N186 End stage renal disease: Secondary | ICD-10-CM | POA: Diagnosis not present

## 2015-06-14 DIAGNOSIS — D631 Anemia in chronic kidney disease: Secondary | ICD-10-CM | POA: Diagnosis not present

## 2015-06-15 DIAGNOSIS — N2581 Secondary hyperparathyroidism of renal origin: Secondary | ICD-10-CM | POA: Diagnosis not present

## 2015-06-15 DIAGNOSIS — D509 Iron deficiency anemia, unspecified: Secondary | ICD-10-CM | POA: Diagnosis not present

## 2015-06-15 DIAGNOSIS — D631 Anemia in chronic kidney disease: Secondary | ICD-10-CM | POA: Diagnosis not present

## 2015-06-15 DIAGNOSIS — N186 End stage renal disease: Secondary | ICD-10-CM | POA: Diagnosis not present

## 2015-06-17 DIAGNOSIS — D509 Iron deficiency anemia, unspecified: Secondary | ICD-10-CM | POA: Diagnosis not present

## 2015-06-17 DIAGNOSIS — D631 Anemia in chronic kidney disease: Secondary | ICD-10-CM | POA: Diagnosis not present

## 2015-06-17 DIAGNOSIS — N186 End stage renal disease: Secondary | ICD-10-CM | POA: Diagnosis not present

## 2015-06-17 DIAGNOSIS — N2581 Secondary hyperparathyroidism of renal origin: Secondary | ICD-10-CM | POA: Diagnosis not present

## 2015-06-20 DIAGNOSIS — N186 End stage renal disease: Secondary | ICD-10-CM | POA: Diagnosis not present

## 2015-06-20 DIAGNOSIS — D509 Iron deficiency anemia, unspecified: Secondary | ICD-10-CM | POA: Diagnosis not present

## 2015-06-20 DIAGNOSIS — N2581 Secondary hyperparathyroidism of renal origin: Secondary | ICD-10-CM | POA: Diagnosis not present

## 2015-06-20 DIAGNOSIS — D631 Anemia in chronic kidney disease: Secondary | ICD-10-CM | POA: Diagnosis not present

## 2015-06-21 DIAGNOSIS — I6523 Occlusion and stenosis of bilateral carotid arteries: Secondary | ICD-10-CM | POA: Diagnosis not present

## 2015-06-21 DIAGNOSIS — I10 Essential (primary) hypertension: Secondary | ICD-10-CM | POA: Diagnosis not present

## 2015-06-21 DIAGNOSIS — T82858A Stenosis of vascular prosthetic devices, implants and grafts, initial encounter: Secondary | ICD-10-CM | POA: Diagnosis not present

## 2015-06-21 DIAGNOSIS — Y841 Kidney dialysis as the cause of abnormal reaction of the patient, or of later complication, without mention of misadventure at the time of the procedure: Secondary | ICD-10-CM | POA: Diagnosis not present

## 2015-06-21 DIAGNOSIS — Z992 Dependence on renal dialysis: Secondary | ICD-10-CM | POA: Diagnosis not present

## 2015-06-21 DIAGNOSIS — N186 End stage renal disease: Secondary | ICD-10-CM | POA: Diagnosis not present

## 2015-06-21 DIAGNOSIS — T82318A Breakdown (mechanical) of other vascular grafts, initial encounter: Secondary | ICD-10-CM | POA: Diagnosis not present

## 2015-06-22 DIAGNOSIS — N186 End stage renal disease: Secondary | ICD-10-CM | POA: Diagnosis not present

## 2015-06-22 DIAGNOSIS — D509 Iron deficiency anemia, unspecified: Secondary | ICD-10-CM | POA: Diagnosis not present

## 2015-06-22 DIAGNOSIS — N2581 Secondary hyperparathyroidism of renal origin: Secondary | ICD-10-CM | POA: Diagnosis not present

## 2015-06-22 DIAGNOSIS — D631 Anemia in chronic kidney disease: Secondary | ICD-10-CM | POA: Diagnosis not present

## 2015-06-24 DIAGNOSIS — D631 Anemia in chronic kidney disease: Secondary | ICD-10-CM | POA: Diagnosis not present

## 2015-06-24 DIAGNOSIS — N186 End stage renal disease: Secondary | ICD-10-CM | POA: Diagnosis not present

## 2015-06-24 DIAGNOSIS — N2581 Secondary hyperparathyroidism of renal origin: Secondary | ICD-10-CM | POA: Diagnosis not present

## 2015-06-24 DIAGNOSIS — D509 Iron deficiency anemia, unspecified: Secondary | ICD-10-CM | POA: Diagnosis not present

## 2015-06-26 DIAGNOSIS — Z992 Dependence on renal dialysis: Secondary | ICD-10-CM | POA: Diagnosis not present

## 2015-06-26 DIAGNOSIS — N186 End stage renal disease: Secondary | ICD-10-CM | POA: Diagnosis not present

## 2015-06-26 DIAGNOSIS — N039 Chronic nephritic syndrome with unspecified morphologic changes: Secondary | ICD-10-CM | POA: Diagnosis not present

## 2015-06-27 DIAGNOSIS — N186 End stage renal disease: Secondary | ICD-10-CM | POA: Diagnosis not present

## 2015-06-27 DIAGNOSIS — D509 Iron deficiency anemia, unspecified: Secondary | ICD-10-CM | POA: Diagnosis not present

## 2015-06-27 DIAGNOSIS — D631 Anemia in chronic kidney disease: Secondary | ICD-10-CM | POA: Diagnosis not present

## 2015-06-27 DIAGNOSIS — N2581 Secondary hyperparathyroidism of renal origin: Secondary | ICD-10-CM | POA: Diagnosis not present

## 2015-06-29 DIAGNOSIS — D631 Anemia in chronic kidney disease: Secondary | ICD-10-CM | POA: Diagnosis not present

## 2015-06-29 DIAGNOSIS — N2581 Secondary hyperparathyroidism of renal origin: Secondary | ICD-10-CM | POA: Diagnosis not present

## 2015-06-29 DIAGNOSIS — N186 End stage renal disease: Secondary | ICD-10-CM | POA: Diagnosis not present

## 2015-06-29 DIAGNOSIS — D509 Iron deficiency anemia, unspecified: Secondary | ICD-10-CM | POA: Diagnosis not present

## 2015-07-01 DIAGNOSIS — D631 Anemia in chronic kidney disease: Secondary | ICD-10-CM | POA: Diagnosis not present

## 2015-07-01 DIAGNOSIS — N2581 Secondary hyperparathyroidism of renal origin: Secondary | ICD-10-CM | POA: Diagnosis not present

## 2015-07-01 DIAGNOSIS — N186 End stage renal disease: Secondary | ICD-10-CM | POA: Diagnosis not present

## 2015-07-01 DIAGNOSIS — D509 Iron deficiency anemia, unspecified: Secondary | ICD-10-CM | POA: Diagnosis not present

## 2015-07-04 DIAGNOSIS — D631 Anemia in chronic kidney disease: Secondary | ICD-10-CM | POA: Diagnosis not present

## 2015-07-04 DIAGNOSIS — D509 Iron deficiency anemia, unspecified: Secondary | ICD-10-CM | POA: Diagnosis not present

## 2015-07-04 DIAGNOSIS — N2581 Secondary hyperparathyroidism of renal origin: Secondary | ICD-10-CM | POA: Diagnosis not present

## 2015-07-04 DIAGNOSIS — N186 End stage renal disease: Secondary | ICD-10-CM | POA: Diagnosis not present

## 2015-07-06 DIAGNOSIS — N186 End stage renal disease: Secondary | ICD-10-CM | POA: Diagnosis not present

## 2015-07-06 DIAGNOSIS — N2581 Secondary hyperparathyroidism of renal origin: Secondary | ICD-10-CM | POA: Diagnosis not present

## 2015-07-06 DIAGNOSIS — D509 Iron deficiency anemia, unspecified: Secondary | ICD-10-CM | POA: Diagnosis not present

## 2015-07-06 DIAGNOSIS — D631 Anemia in chronic kidney disease: Secondary | ICD-10-CM | POA: Diagnosis not present

## 2015-07-08 DIAGNOSIS — D631 Anemia in chronic kidney disease: Secondary | ICD-10-CM | POA: Diagnosis not present

## 2015-07-08 DIAGNOSIS — N2581 Secondary hyperparathyroidism of renal origin: Secondary | ICD-10-CM | POA: Diagnosis not present

## 2015-07-08 DIAGNOSIS — D509 Iron deficiency anemia, unspecified: Secondary | ICD-10-CM | POA: Diagnosis not present

## 2015-07-08 DIAGNOSIS — N186 End stage renal disease: Secondary | ICD-10-CM | POA: Diagnosis not present

## 2015-07-11 DIAGNOSIS — D631 Anemia in chronic kidney disease: Secondary | ICD-10-CM | POA: Diagnosis not present

## 2015-07-11 DIAGNOSIS — N2581 Secondary hyperparathyroidism of renal origin: Secondary | ICD-10-CM | POA: Diagnosis not present

## 2015-07-11 DIAGNOSIS — D509 Iron deficiency anemia, unspecified: Secondary | ICD-10-CM | POA: Diagnosis not present

## 2015-07-11 DIAGNOSIS — N186 End stage renal disease: Secondary | ICD-10-CM | POA: Diagnosis not present

## 2015-07-12 DIAGNOSIS — M65311 Trigger thumb, right thumb: Secondary | ICD-10-CM | POA: Diagnosis not present

## 2015-07-13 DIAGNOSIS — D631 Anemia in chronic kidney disease: Secondary | ICD-10-CM | POA: Diagnosis not present

## 2015-07-13 DIAGNOSIS — D509 Iron deficiency anemia, unspecified: Secondary | ICD-10-CM | POA: Diagnosis not present

## 2015-07-13 DIAGNOSIS — N2581 Secondary hyperparathyroidism of renal origin: Secondary | ICD-10-CM | POA: Diagnosis not present

## 2015-07-13 DIAGNOSIS — N186 End stage renal disease: Secondary | ICD-10-CM | POA: Diagnosis not present

## 2015-07-15 DIAGNOSIS — D509 Iron deficiency anemia, unspecified: Secondary | ICD-10-CM | POA: Diagnosis not present

## 2015-07-15 DIAGNOSIS — N2581 Secondary hyperparathyroidism of renal origin: Secondary | ICD-10-CM | POA: Diagnosis not present

## 2015-07-15 DIAGNOSIS — N186 End stage renal disease: Secondary | ICD-10-CM | POA: Diagnosis not present

## 2015-07-15 DIAGNOSIS — D631 Anemia in chronic kidney disease: Secondary | ICD-10-CM | POA: Diagnosis not present

## 2015-07-18 DIAGNOSIS — D631 Anemia in chronic kidney disease: Secondary | ICD-10-CM | POA: Diagnosis not present

## 2015-07-18 DIAGNOSIS — N2581 Secondary hyperparathyroidism of renal origin: Secondary | ICD-10-CM | POA: Diagnosis not present

## 2015-07-18 DIAGNOSIS — D509 Iron deficiency anemia, unspecified: Secondary | ICD-10-CM | POA: Diagnosis not present

## 2015-07-18 DIAGNOSIS — N186 End stage renal disease: Secondary | ICD-10-CM | POA: Diagnosis not present

## 2015-07-19 DIAGNOSIS — N186 End stage renal disease: Secondary | ICD-10-CM | POA: Diagnosis not present

## 2015-07-19 DIAGNOSIS — I6523 Occlusion and stenosis of bilateral carotid arteries: Secondary | ICD-10-CM | POA: Diagnosis not present

## 2015-07-19 DIAGNOSIS — Z992 Dependence on renal dialysis: Secondary | ICD-10-CM | POA: Diagnosis not present

## 2015-07-19 DIAGNOSIS — T82858A Stenosis of vascular prosthetic devices, implants and grafts, initial encounter: Secondary | ICD-10-CM | POA: Diagnosis not present

## 2015-07-19 DIAGNOSIS — I10 Essential (primary) hypertension: Secondary | ICD-10-CM | POA: Diagnosis not present

## 2015-07-19 DIAGNOSIS — Y841 Kidney dialysis as the cause of abnormal reaction of the patient, or of later complication, without mention of misadventure at the time of the procedure: Secondary | ICD-10-CM | POA: Diagnosis not present

## 2015-07-19 DIAGNOSIS — T82318A Breakdown (mechanical) of other vascular grafts, initial encounter: Secondary | ICD-10-CM | POA: Diagnosis not present

## 2015-07-20 DIAGNOSIS — N2581 Secondary hyperparathyroidism of renal origin: Secondary | ICD-10-CM | POA: Diagnosis not present

## 2015-07-20 DIAGNOSIS — D509 Iron deficiency anemia, unspecified: Secondary | ICD-10-CM | POA: Diagnosis not present

## 2015-07-20 DIAGNOSIS — N186 End stage renal disease: Secondary | ICD-10-CM | POA: Diagnosis not present

## 2015-07-20 DIAGNOSIS — D631 Anemia in chronic kidney disease: Secondary | ICD-10-CM | POA: Diagnosis not present

## 2015-07-21 ENCOUNTER — Other Ambulatory Visit: Payer: Self-pay

## 2015-07-21 DIAGNOSIS — Z1231 Encounter for screening mammogram for malignant neoplasm of breast: Secondary | ICD-10-CM

## 2015-07-22 DIAGNOSIS — D509 Iron deficiency anemia, unspecified: Secondary | ICD-10-CM | POA: Diagnosis not present

## 2015-07-22 DIAGNOSIS — N2581 Secondary hyperparathyroidism of renal origin: Secondary | ICD-10-CM | POA: Diagnosis not present

## 2015-07-22 DIAGNOSIS — D631 Anemia in chronic kidney disease: Secondary | ICD-10-CM | POA: Diagnosis not present

## 2015-07-22 DIAGNOSIS — N186 End stage renal disease: Secondary | ICD-10-CM | POA: Diagnosis not present

## 2015-07-25 DIAGNOSIS — D509 Iron deficiency anemia, unspecified: Secondary | ICD-10-CM | POA: Diagnosis not present

## 2015-07-25 DIAGNOSIS — N186 End stage renal disease: Secondary | ICD-10-CM | POA: Diagnosis not present

## 2015-07-25 DIAGNOSIS — D631 Anemia in chronic kidney disease: Secondary | ICD-10-CM | POA: Diagnosis not present

## 2015-07-25 DIAGNOSIS — N2581 Secondary hyperparathyroidism of renal origin: Secondary | ICD-10-CM | POA: Diagnosis not present

## 2015-07-27 DIAGNOSIS — N186 End stage renal disease: Secondary | ICD-10-CM | POA: Diagnosis not present

## 2015-07-27 DIAGNOSIS — D509 Iron deficiency anemia, unspecified: Secondary | ICD-10-CM | POA: Diagnosis not present

## 2015-07-27 DIAGNOSIS — N2581 Secondary hyperparathyroidism of renal origin: Secondary | ICD-10-CM | POA: Diagnosis not present

## 2015-07-27 DIAGNOSIS — D631 Anemia in chronic kidney disease: Secondary | ICD-10-CM | POA: Diagnosis not present

## 2015-07-27 DIAGNOSIS — Z992 Dependence on renal dialysis: Secondary | ICD-10-CM | POA: Diagnosis not present

## 2015-07-27 DIAGNOSIS — N039 Chronic nephritic syndrome with unspecified morphologic changes: Secondary | ICD-10-CM | POA: Diagnosis not present

## 2015-07-29 DIAGNOSIS — N2581 Secondary hyperparathyroidism of renal origin: Secondary | ICD-10-CM | POA: Diagnosis not present

## 2015-07-29 DIAGNOSIS — N186 End stage renal disease: Secondary | ICD-10-CM | POA: Diagnosis not present

## 2015-07-29 DIAGNOSIS — D509 Iron deficiency anemia, unspecified: Secondary | ICD-10-CM | POA: Diagnosis not present

## 2015-07-29 DIAGNOSIS — D631 Anemia in chronic kidney disease: Secondary | ICD-10-CM | POA: Diagnosis not present

## 2015-08-01 DIAGNOSIS — D631 Anemia in chronic kidney disease: Secondary | ICD-10-CM | POA: Diagnosis not present

## 2015-08-01 DIAGNOSIS — D509 Iron deficiency anemia, unspecified: Secondary | ICD-10-CM | POA: Diagnosis not present

## 2015-08-01 DIAGNOSIS — N2581 Secondary hyperparathyroidism of renal origin: Secondary | ICD-10-CM | POA: Diagnosis not present

## 2015-08-01 DIAGNOSIS — N186 End stage renal disease: Secondary | ICD-10-CM | POA: Diagnosis not present

## 2015-08-03 DIAGNOSIS — D631 Anemia in chronic kidney disease: Secondary | ICD-10-CM | POA: Diagnosis not present

## 2015-08-03 DIAGNOSIS — D509 Iron deficiency anemia, unspecified: Secondary | ICD-10-CM | POA: Diagnosis not present

## 2015-08-03 DIAGNOSIS — N2581 Secondary hyperparathyroidism of renal origin: Secondary | ICD-10-CM | POA: Diagnosis not present

## 2015-08-03 DIAGNOSIS — N186 End stage renal disease: Secondary | ICD-10-CM | POA: Diagnosis not present

## 2015-08-04 ENCOUNTER — Ambulatory Visit
Admission: RE | Admit: 2015-08-04 | Discharge: 2015-08-04 | Disposition: A | Discharge status: Home / Self Care | Payer: Medicare Other | Source: Ambulatory Visit

## 2015-08-04 DIAGNOSIS — Z1231 Encounter for screening mammogram for malignant neoplasm of breast: Secondary | ICD-10-CM

## 2015-08-05 DIAGNOSIS — N2581 Secondary hyperparathyroidism of renal origin: Secondary | ICD-10-CM | POA: Diagnosis not present

## 2015-08-05 DIAGNOSIS — D631 Anemia in chronic kidney disease: Secondary | ICD-10-CM | POA: Diagnosis not present

## 2015-08-05 DIAGNOSIS — D509 Iron deficiency anemia, unspecified: Secondary | ICD-10-CM | POA: Diagnosis not present

## 2015-08-05 DIAGNOSIS — N186 End stage renal disease: Secondary | ICD-10-CM | POA: Diagnosis not present

## 2015-08-08 DIAGNOSIS — D631 Anemia in chronic kidney disease: Secondary | ICD-10-CM | POA: Diagnosis not present

## 2015-08-08 DIAGNOSIS — D509 Iron deficiency anemia, unspecified: Secondary | ICD-10-CM | POA: Diagnosis not present

## 2015-08-08 DIAGNOSIS — N2581 Secondary hyperparathyroidism of renal origin: Secondary | ICD-10-CM | POA: Diagnosis not present

## 2015-08-08 DIAGNOSIS — N186 End stage renal disease: Secondary | ICD-10-CM | POA: Diagnosis not present

## 2015-08-10 DIAGNOSIS — N186 End stage renal disease: Secondary | ICD-10-CM | POA: Diagnosis not present

## 2015-08-10 DIAGNOSIS — D509 Iron deficiency anemia, unspecified: Secondary | ICD-10-CM | POA: Diagnosis not present

## 2015-08-10 DIAGNOSIS — N2581 Secondary hyperparathyroidism of renal origin: Secondary | ICD-10-CM | POA: Diagnosis not present

## 2015-08-10 DIAGNOSIS — D631 Anemia in chronic kidney disease: Secondary | ICD-10-CM | POA: Diagnosis not present

## 2015-08-12 DIAGNOSIS — N2581 Secondary hyperparathyroidism of renal origin: Secondary | ICD-10-CM | POA: Diagnosis not present

## 2015-08-12 DIAGNOSIS — N186 End stage renal disease: Secondary | ICD-10-CM | POA: Diagnosis not present

## 2015-08-12 DIAGNOSIS — D509 Iron deficiency anemia, unspecified: Secondary | ICD-10-CM | POA: Diagnosis not present

## 2015-08-12 DIAGNOSIS — D631 Anemia in chronic kidney disease: Secondary | ICD-10-CM | POA: Diagnosis not present

## 2015-08-15 DIAGNOSIS — N186 End stage renal disease: Secondary | ICD-10-CM | POA: Diagnosis not present

## 2015-08-15 DIAGNOSIS — N2581 Secondary hyperparathyroidism of renal origin: Secondary | ICD-10-CM | POA: Diagnosis not present

## 2015-08-15 DIAGNOSIS — D509 Iron deficiency anemia, unspecified: Secondary | ICD-10-CM | POA: Diagnosis not present

## 2015-08-15 DIAGNOSIS — D631 Anemia in chronic kidney disease: Secondary | ICD-10-CM | POA: Diagnosis not present

## 2015-08-17 DIAGNOSIS — N186 End stage renal disease: Secondary | ICD-10-CM | POA: Diagnosis not present

## 2015-08-17 DIAGNOSIS — D631 Anemia in chronic kidney disease: Secondary | ICD-10-CM | POA: Diagnosis not present

## 2015-08-17 DIAGNOSIS — N2581 Secondary hyperparathyroidism of renal origin: Secondary | ICD-10-CM | POA: Diagnosis not present

## 2015-08-17 DIAGNOSIS — D509 Iron deficiency anemia, unspecified: Secondary | ICD-10-CM | POA: Diagnosis not present

## 2015-08-19 DIAGNOSIS — N2581 Secondary hyperparathyroidism of renal origin: Secondary | ICD-10-CM | POA: Diagnosis not present

## 2015-08-19 DIAGNOSIS — D509 Iron deficiency anemia, unspecified: Secondary | ICD-10-CM | POA: Diagnosis not present

## 2015-08-19 DIAGNOSIS — D631 Anemia in chronic kidney disease: Secondary | ICD-10-CM | POA: Diagnosis not present

## 2015-08-19 DIAGNOSIS — N186 End stage renal disease: Secondary | ICD-10-CM | POA: Diagnosis not present

## 2015-08-22 DIAGNOSIS — N186 End stage renal disease: Secondary | ICD-10-CM | POA: Diagnosis not present

## 2015-08-22 DIAGNOSIS — D631 Anemia in chronic kidney disease: Secondary | ICD-10-CM | POA: Diagnosis not present

## 2015-08-22 DIAGNOSIS — N2581 Secondary hyperparathyroidism of renal origin: Secondary | ICD-10-CM | POA: Diagnosis not present

## 2015-08-22 DIAGNOSIS — D509 Iron deficiency anemia, unspecified: Secondary | ICD-10-CM | POA: Diagnosis not present

## 2015-08-24 ENCOUNTER — Ambulatory Visit (HOSPITAL_COMMUNITY)
Admission: EM | Admit: 2015-08-24 | Discharge: 2015-08-24 | Disposition: A | Discharge status: Home / Self Care | Payer: Medicare Other | Attending: Family Medicine | Admitting: Family Medicine

## 2015-08-24 ENCOUNTER — Encounter (HOSPITAL_COMMUNITY): Payer: Self-pay

## 2015-08-24 DIAGNOSIS — M5440 Lumbago with sciatica, unspecified side: Secondary | ICD-10-CM

## 2015-08-24 DIAGNOSIS — D631 Anemia in chronic kidney disease: Secondary | ICD-10-CM | POA: Diagnosis not present

## 2015-08-24 DIAGNOSIS — N2581 Secondary hyperparathyroidism of renal origin: Secondary | ICD-10-CM | POA: Diagnosis not present

## 2015-08-24 DIAGNOSIS — N186 End stage renal disease: Secondary | ICD-10-CM | POA: Diagnosis not present

## 2015-08-24 DIAGNOSIS — D509 Iron deficiency anemia, unspecified: Secondary | ICD-10-CM | POA: Diagnosis not present

## 2015-08-24 MED ORDER — METHOCARBAMOL 500 MG PO TABS: 500.0000 mg | ORAL_TABLET | Freq: Two times a day (BID) | ORAL | Status: DC

## 2015-08-24 NOTE — ED Provider Notes (Signed)
CSN: [id number]     Arrival date & time 08/24/15  1614 History   First MD Initiated Contact with Patient 08/24/15 1655     Chief Complaint  Patient presents with  . Leg Pain  . Back Pain   (Consider location/radiation/quality/duration/timing/severity/associated sxs/prior Treatment) Patient is a 60 y.o. female presenting with leg pain. The history is provided by the patient.  Leg Pain Location:  Leg Time since incident:  1 week Injury: no   Leg location:  L upper leg and R upper leg Pain details:    Severity:  Mild   Progression:  Improving Chronicity:  New Dislocation: no   Prior injury to area:  No Ineffective treatments:  NSAIDs Associated symptoms: back pain   Risk factors: obesity   Risk factors comment:  Pt on dialysis, out of condition physically.   Past Medical History  Diagnosis Date  . End stage renal disease on dialysis ([place])   . Complication of anesthesia     due to kidney disease  . GERD (gastroesophageal reflux disease)   . Gout    Past Surgical History  Procedure Laterality Date  . Dg av dialysis graft declot or    . Tubal ligation  1983  . Dilatation & curettage/hysteroscopy with trueclear N/A 11/06/2012    Procedure: DILATATION & CURETTAGE/HYSTEROSCOPY WITH TRUECLEAR;  Surgeon: Terrance Mass, MD;  Location: [facility];  Service: Gynecology;  Laterality: N/A;  Truclear Resectoscopic Polypectomy   . Peripheral vascular catheterization N/A 09/14/2014    Procedure: A/V Shuntogram/Fistulagram;  Surgeon: Katha Cabal, MD;  Location: [facility];  Service: Cardiovascular;  Laterality: N/A;  . Peripheral vascular catheterization N/A 09/14/2014    Procedure: A/V Shunt Intervention;  Surgeon: Katha Cabal, MD;  Location: [facility];  Service: Cardiovascular;  Laterality: N/A;  . Peripheral vascular catheterization Right 12/09/2014    Procedure: A/V Shuntogram/Fistulagram;  Surgeon: Algernon Huxley, MD;  Location: [facility];   Service: Cardiovascular;  Laterality: Right;  . Peripheral vascular catheterization N/A 12/09/2014    Procedure: A/V Shunt Intervention;  Surgeon: Algernon Huxley, MD;  Location: [facility];  Service: Cardiovascular;  Laterality: N/A;  . Peripheral vascular catheterization Right 05/24/2015    Procedure: A/V Shuntogram;  Surgeon: Serafina Mitchell, MD;  Location: [facility];  Service: Cardiovascular;  Laterality: Right;  . Peripheral vascular catheterization Right 05/24/2015    Procedure: Peripheral Vascular Balloon Angioplasty;  Surgeon: Serafina Mitchell, MD;  Location: [facility];  Service: Cardiovascular;  Laterality: Right;  right arm shunt  . Peripheral vascular catheterization N/A 06/13/2015    Procedure: A/V Shuntogram/Fistulagram;  Surgeon: Algernon Huxley, MD;  Location: [facility];  Service: Cardiovascular;  Laterality: N/A;  . Peripheral vascular catheterization N/A 06/13/2015    Procedure: A/V Shunt Intervention;  Surgeon: Algernon Huxley, MD;  Location: [facility];  Service: Cardiovascular;  Laterality: N/A;   Family History  Problem Relation Age of Onset  . Diabetes Mother   . Hypertension Mother   . Heart disease Mother   . Alcohol abuse Mother   . Kidney disease Mother   . Cancer Father     COLON  . Alcohol abuse Father   . Breast cancer Sister 82  . Hypertension Sister   . Kidney disease Sister   . Hypertension Son    Social History  Substance Use Topics  . Smoking status: Never Smoker   . Smokeless  tobacco: Never Used  . Alcohol Use: No   OB History    Gravida Para Term Preterm AB TAB SAB Ectopic Multiple Living   2 2        2      Review of Systems  Constitutional: Negative.   Cardiovascular: Negative.   Gastrointestinal: Negative.   Genitourinary: Negative.   Musculoskeletal: Positive for back pain. Negative for gait problem.  All other systems reviewed and are negative.   Allergies  Contrast media and Ancef  Home  Medications   Prior to Admission medications   Medication Sig Start Date End Date Taking? Authorizing Provider  cinacalcet (SENSIPAR) 60 MG tablet Take 60 mg by mouth daily.   Yes Historical Provider, MD  ibuprofen (ADVIL,MOTRIN) 200 MG tablet Take 400 mg by mouth every 6 (six) hours as needed for pain.   Yes Historical Provider, MD  midodrine (PROAMATINE) 10 MG tablet Take 10 mg by mouth 2 (two) times daily.    Yes Historical Provider, MD  Multiple Vitamins-Minerals (PRORENAL VITAL) TABS Take 1 tablet by mouth daily.   Yes Historical Provider, MD  sevelamer (RENVELA) 800 MG tablet Take 1,600 mg by mouth 3 (three) times daily with meals.    Yes Historical Provider, MD  albuterol (PROVENTIL HFA;VENTOLIN HFA) 108 (90 BASE) MCG/ACT inhaler Inhale 2 puffs into the lungs every 6 (six) hours as needed for wheezing.    Historical Provider, MD  aspirin EC 81 MG tablet Take 81 mg by mouth daily.    Historical Provider, MD  famotidine (PEPCID) 20 MG tablet Take 20 mg by mouth daily as needed for heartburn or indigestion.    Historical Provider, MD  ipratropium (ATROVENT) 0.06 % nasal spray Place 2 sprays into both nostrils 4 (four) times daily. 05/26/13   Billy Fischer, MD  methocarbamol (ROBAXIN) 500 MG tablet Take 1 tablet (500 mg total) by mouth 2 (two) times daily. 08/24/15   Billy Fischer, MD   Meds Ordered and Administered this Visit  Medications - No data to display  BP 126/76 mmHg  Pulse 71  Temp(Src) 98.4 F (36.9 C) (Oral)  Resp 15  SpO2 98% No data found.   Physical Exam  Constitutional: She is oriented to person, place, and time. She appears well-developed and well-nourished. No distress.  Musculoskeletal: She exhibits tenderness.  Ambulatory without assistance, nl gait and station.  Neurological: She is alert and oriented to person, place, and time.  Skin: Skin is warm and dry.  Nursing note and vitals reviewed.   ED Course  Procedures (including critical care time)  Labs  Review Labs Reviewed - No data to display  Imaging Review No results found.   Visual Acuity Review  Right Eye Distance:   Left Eye Distance:   Bilateral Distance:    Right Eye Near:   Left Eye Near:    Bilateral Near:         MDM   1. Low back pain with sciatica, sciatica laterality unspecified, unspecified back pain laterality        Billy Fischer, MD 08/24/15 1726

## 2015-08-24 NOTE — ED Notes (Signed)
patient presents with back and bilateral leg pain x1 week, pt has taken ibuprofen about 2 nights ago for pain,  No acute distress

## 2015-08-24 NOTE — Discharge Instructions (Signed)
Heat to back and use medication, see orthopedist if continued problem.

## 2015-08-26 DIAGNOSIS — N039 Chronic nephritic syndrome with unspecified morphologic changes: Secondary | ICD-10-CM | POA: Diagnosis not present

## 2015-08-26 DIAGNOSIS — Z992 Dependence on renal dialysis: Secondary | ICD-10-CM | POA: Diagnosis not present

## 2015-08-26 DIAGNOSIS — N186 End stage renal disease: Secondary | ICD-10-CM | POA: Diagnosis not present

## 2015-08-26 DIAGNOSIS — D631 Anemia in chronic kidney disease: Secondary | ICD-10-CM | POA: Diagnosis not present

## 2015-08-26 DIAGNOSIS — D509 Iron deficiency anemia, unspecified: Secondary | ICD-10-CM | POA: Diagnosis not present

## 2015-08-26 DIAGNOSIS — N2581 Secondary hyperparathyroidism of renal origin: Secondary | ICD-10-CM | POA: Diagnosis not present

## 2015-08-29 DIAGNOSIS — N2581 Secondary hyperparathyroidism of renal origin: Secondary | ICD-10-CM | POA: Diagnosis not present

## 2015-08-29 DIAGNOSIS — D509 Iron deficiency anemia, unspecified: Secondary | ICD-10-CM | POA: Diagnosis not present

## 2015-08-29 DIAGNOSIS — D631 Anemia in chronic kidney disease: Secondary | ICD-10-CM | POA: Diagnosis not present

## 2015-08-29 DIAGNOSIS — N186 End stage renal disease: Secondary | ICD-10-CM | POA: Diagnosis not present

## 2015-08-31 DIAGNOSIS — N2581 Secondary hyperparathyroidism of renal origin: Secondary | ICD-10-CM | POA: Diagnosis not present

## 2015-08-31 DIAGNOSIS — D631 Anemia in chronic kidney disease: Secondary | ICD-10-CM | POA: Diagnosis not present

## 2015-08-31 DIAGNOSIS — N186 End stage renal disease: Secondary | ICD-10-CM | POA: Diagnosis not present

## 2015-08-31 DIAGNOSIS — D509 Iron deficiency anemia, unspecified: Secondary | ICD-10-CM | POA: Diagnosis not present

## 2015-09-02 DIAGNOSIS — N2581 Secondary hyperparathyroidism of renal origin: Secondary | ICD-10-CM | POA: Diagnosis not present

## 2015-09-02 DIAGNOSIS — D509 Iron deficiency anemia, unspecified: Secondary | ICD-10-CM | POA: Diagnosis not present

## 2015-09-02 DIAGNOSIS — N186 End stage renal disease: Secondary | ICD-10-CM | POA: Diagnosis not present

## 2015-09-02 DIAGNOSIS — D631 Anemia in chronic kidney disease: Secondary | ICD-10-CM | POA: Diagnosis not present

## 2015-09-05 DIAGNOSIS — N186 End stage renal disease: Secondary | ICD-10-CM | POA: Diagnosis not present

## 2015-09-05 DIAGNOSIS — D509 Iron deficiency anemia, unspecified: Secondary | ICD-10-CM | POA: Diagnosis not present

## 2015-09-05 DIAGNOSIS — N2581 Secondary hyperparathyroidism of renal origin: Secondary | ICD-10-CM | POA: Diagnosis not present

## 2015-09-05 DIAGNOSIS — D631 Anemia in chronic kidney disease: Secondary | ICD-10-CM | POA: Diagnosis not present

## 2015-09-07 DIAGNOSIS — D631 Anemia in chronic kidney disease: Secondary | ICD-10-CM | POA: Diagnosis not present

## 2015-09-07 DIAGNOSIS — D509 Iron deficiency anemia, unspecified: Secondary | ICD-10-CM | POA: Diagnosis not present

## 2015-09-07 DIAGNOSIS — N186 End stage renal disease: Secondary | ICD-10-CM | POA: Diagnosis not present

## 2015-09-07 DIAGNOSIS — N2581 Secondary hyperparathyroidism of renal origin: Secondary | ICD-10-CM | POA: Diagnosis not present

## 2015-09-08 DIAGNOSIS — M25561 Pain in right knee: Secondary | ICD-10-CM | POA: Diagnosis not present

## 2015-09-08 DIAGNOSIS — M25562 Pain in left knee: Secondary | ICD-10-CM | POA: Diagnosis not present

## 2015-09-09 DIAGNOSIS — N186 End stage renal disease: Secondary | ICD-10-CM | POA: Diagnosis not present

## 2015-09-09 DIAGNOSIS — D509 Iron deficiency anemia, unspecified: Secondary | ICD-10-CM | POA: Diagnosis not present

## 2015-09-09 DIAGNOSIS — N2581 Secondary hyperparathyroidism of renal origin: Secondary | ICD-10-CM | POA: Diagnosis not present

## 2015-09-09 DIAGNOSIS — D631 Anemia in chronic kidney disease: Secondary | ICD-10-CM | POA: Diagnosis not present

## 2015-09-12 DIAGNOSIS — N2581 Secondary hyperparathyroidism of renal origin: Secondary | ICD-10-CM | POA: Diagnosis not present

## 2015-09-12 DIAGNOSIS — N186 End stage renal disease: Secondary | ICD-10-CM | POA: Diagnosis not present

## 2015-09-12 DIAGNOSIS — D631 Anemia in chronic kidney disease: Secondary | ICD-10-CM | POA: Diagnosis not present

## 2015-09-12 DIAGNOSIS — D509 Iron deficiency anemia, unspecified: Secondary | ICD-10-CM | POA: Diagnosis not present

## 2015-09-13 ENCOUNTER — Encounter: Payer: Medicare Other | Admitting: Gynecology

## 2015-09-14 DIAGNOSIS — N2581 Secondary hyperparathyroidism of renal origin: Secondary | ICD-10-CM | POA: Diagnosis not present

## 2015-09-14 DIAGNOSIS — N186 End stage renal disease: Secondary | ICD-10-CM | POA: Diagnosis not present

## 2015-09-14 DIAGNOSIS — D631 Anemia in chronic kidney disease: Secondary | ICD-10-CM | POA: Diagnosis not present

## 2015-09-14 DIAGNOSIS — D509 Iron deficiency anemia, unspecified: Secondary | ICD-10-CM | POA: Diagnosis not present

## 2015-09-15 ENCOUNTER — Ambulatory Visit (INDEPENDENT_AMBULATORY_CARE_PROVIDER_SITE_OTHER): Payer: Medicare Other | Admitting: Gynecology

## 2015-09-15 ENCOUNTER — Encounter: Payer: Self-pay | Admitting: Gynecology

## 2015-09-15 VITALS — BP 124/78 | Ht 61.5 in | Wt 224.0 lb

## 2015-09-15 DIAGNOSIS — Z780 Asymptomatic menopausal state: Secondary | ICD-10-CM

## 2015-09-15 DIAGNOSIS — L292 Pruritus vulvae: Secondary | ICD-10-CM | POA: Diagnosis not present

## 2015-09-15 DIAGNOSIS — Z01419 Encounter for gynecological examination (general) (routine) without abnormal findings: Secondary | ICD-10-CM

## 2015-09-15 DIAGNOSIS — N898 Other specified noninflammatory disorders of vagina: Secondary | ICD-10-CM | POA: Diagnosis not present

## 2015-09-15 DIAGNOSIS — D251 Intramural leiomyoma of uterus: Secondary | ICD-10-CM

## 2015-09-15 LAB — WET PREP FOR TRICH, YEAST, CLUE
Clue Cells Wet Prep HPF POC: NONE SEEN
TRICH WET PREP: NONE SEEN
WBC, Wet Prep HPF POC: NONE SEEN
Yeast Wet Prep HPF POC: NONE SEEN

## 2015-09-15 MED ORDER — FLUCONAZOLE 150 MG PO TABS: 150.0000 mg | ORAL_TABLET | Freq: Once | ORAL | Status: DC

## 2015-09-15 NOTE — Progress Notes (Addendum)
[name] Marks 08/04/1955 [id number]   History:    60 y.o.  for annual gyn exam who was complaining of vulvar pruritus and a slight discharge for the past month. She has tried over-the-counter Monistat with minimal success. She has no problems with voiding and no GI complaints. Patient with no fever, chills, nausea, or vomiting.  Patient not sexually active. Patient has not been seen the office since her postop visit in 2014 after having had resectoscopic polypectomy as a result of postmenopausal bleeding. The pathology report demonstrated benign endometrial polyp. She denies any vaginal bleeding today. Patient with no past history of abnormal Pap smears.Patient has history of end-stage renal disease and goes to dialysis 3 times a week.patient has history of fibroids. Patient had a colonoscopy in 2016 benign polyps were noted and she is on 5 year recall. Patient's father had history of colon cancer. Patient has not had her bone density. Patient on no hormone replacement therapy.  Past medical history,surgical history, family history and social history were all reviewed and documented in the EPIC chart.  Gynecologic History No LMP recorded. Patient is postmenopausal. Contraception: post menopausal status Last Pap: 2016. Results were: normal Last mammogram: 2017. Results were: normal  Obstetric History OB History  Gravida Para Term Preterm AB SAB TAB Ectopic Multiple Living  2 2        2     # Outcome Date GA Lbr Len/2nd Weight Sex Delivery Anes PTL Lv  2 Para           1 Para                ROS: A ROS was performed and pertinent positives and negatives are included in the history.  GENERAL: No fevers or chills. HEENT: No change in vision, no earache, sore throat or sinus congestion. NECK: No pain or stiffness. CARDIOVASCULAR: No chest pain or pressure. No palpitations. PULMONARY: No shortness of breath, cough or wheeze. GASTROINTESTINAL: No abdominal pain, nausea, vomiting or diarrhea,  melena or bright red blood per rectum. GENITOURINARY: No urinary frequency, urgency, hesitancy or dysuria. MUSCULOSKELETAL: No joint or muscle pain, no back pain, no recent trauma. DERMATOLOGIC: No rash, no itching, no lesions. ENDOCRINE: No polyuria, polydipsia, no heat or cold intolerance. No recent change in weight. HEMATOLOGICAL: No anemia or easy bruising or bleeding. NEUROLOGIC: No headache, seizures, numbness, tingling or weakness. PSYCHIATRIC: No depression, no loss of interest in normal activity or change in sleep pattern.     Exam: chaperone present  BP 124/78 mmHg  Ht 5' 1.5" (1.562 m)  Wt 224 lb (101.606 kg)  BMI 41.64 kg/m2  Body mass index is 41.64 kg/(m^2).  General appearance : Well developed well nourished female. No acute distress HEENT: Eyes: no retinal hemorrhage or exudates,  Neck supple, trachea midline, no carotid bruits, no thyroidmegaly Lungs: Clear to auscultation, no rhonchi or wheezes, or rib retractions  Heart: Regular rate and rhythm, no murmurs or gallops Breast:Examined in sitting and supine position were symmetrical in appearance, no palpable masses or tenderness,  no skin retraction, no nipple inversion, no nipple discharge, no skin discoloration, no axillary or supraclavicular lymphadenopathy Abdomen: no palpable masses or tenderness, no rebound or guarding Extremities: no edema or skin discoloration or tenderness  Pelvic:  Bartholin, Urethra, Skene Glands: Within normal limits             Vagina: No gross lesions or discharge  Cervix: No gross lesions or discharge  Uterus  enlarged although difficult  to assess due to patient's abdominal girth and guarding  Adnexa  same as above  Anus and perineum  normal   Rectovaginal  normal sphincter tone without palpated masses or tenderness             Hemoccult PCP provides   Wet prep only few bacteria otherwise normal  Assessment/Plan:  60 y.o. female for annual exam with history of fibroid uterus will  return back to the office next week for an ultrasound to better assess her uterus and adnexa since she is morbidly obese and was somewhat guarding and I could notcomplete the assessment of the size of her uterus and her ovaries. Her PCP has been doing her blood work. I'm going to call her prescription for Diflucan 150 mg to take 1 by mouth just for the external pruritus is he was experiencing since the inside vaginal swab did not demonstrate any yeast. Pap smear not indicated. Her PCP is been doing her blood work. We will need to do a bone density study sometime this year or next year. We discussed importance of calcium vitamin D and weightbearing exercises for osteoporosis prevention.   Terrance Mass MD, 11:55 AM 09/15/2015

## 2015-09-16 DIAGNOSIS — D631 Anemia in chronic kidney disease: Secondary | ICD-10-CM | POA: Diagnosis not present

## 2015-09-16 DIAGNOSIS — N2581 Secondary hyperparathyroidism of renal origin: Secondary | ICD-10-CM | POA: Diagnosis not present

## 2015-09-16 DIAGNOSIS — D509 Iron deficiency anemia, unspecified: Secondary | ICD-10-CM | POA: Diagnosis not present

## 2015-09-16 DIAGNOSIS — N186 End stage renal disease: Secondary | ICD-10-CM | POA: Diagnosis not present

## 2015-09-17 DIAGNOSIS — M25561 Pain in right knee: Secondary | ICD-10-CM | POA: Diagnosis not present

## 2015-09-17 DIAGNOSIS — G8929 Other chronic pain: Secondary | ICD-10-CM | POA: Diagnosis not present

## 2015-09-19 DIAGNOSIS — D509 Iron deficiency anemia, unspecified: Secondary | ICD-10-CM | POA: Diagnosis not present

## 2015-09-19 DIAGNOSIS — N186 End stage renal disease: Secondary | ICD-10-CM | POA: Diagnosis not present

## 2015-09-19 DIAGNOSIS — N2581 Secondary hyperparathyroidism of renal origin: Secondary | ICD-10-CM | POA: Diagnosis not present

## 2015-09-19 DIAGNOSIS — D631 Anemia in chronic kidney disease: Secondary | ICD-10-CM | POA: Diagnosis not present

## 2015-09-21 DIAGNOSIS — D631 Anemia in chronic kidney disease: Secondary | ICD-10-CM | POA: Diagnosis not present

## 2015-09-21 DIAGNOSIS — N2581 Secondary hyperparathyroidism of renal origin: Secondary | ICD-10-CM | POA: Diagnosis not present

## 2015-09-21 DIAGNOSIS — D509 Iron deficiency anemia, unspecified: Secondary | ICD-10-CM | POA: Diagnosis not present

## 2015-09-21 DIAGNOSIS — N186 End stage renal disease: Secondary | ICD-10-CM | POA: Diagnosis not present

## 2015-09-22 DIAGNOSIS — M25561 Pain in right knee: Secondary | ICD-10-CM | POA: Diagnosis not present

## 2015-09-22 DIAGNOSIS — M1712 Unilateral primary osteoarthritis, left knee: Secondary | ICD-10-CM | POA: Diagnosis not present

## 2015-09-23 DIAGNOSIS — D631 Anemia in chronic kidney disease: Secondary | ICD-10-CM | POA: Diagnosis not present

## 2015-09-23 DIAGNOSIS — N186 End stage renal disease: Secondary | ICD-10-CM | POA: Diagnosis not present

## 2015-09-23 DIAGNOSIS — N2581 Secondary hyperparathyroidism of renal origin: Secondary | ICD-10-CM | POA: Diagnosis not present

## 2015-09-23 DIAGNOSIS — D509 Iron deficiency anemia, unspecified: Secondary | ICD-10-CM | POA: Diagnosis not present

## 2015-09-26 DIAGNOSIS — N2581 Secondary hyperparathyroidism of renal origin: Secondary | ICD-10-CM | POA: Diagnosis not present

## 2015-09-26 DIAGNOSIS — N186 End stage renal disease: Secondary | ICD-10-CM | POA: Diagnosis not present

## 2015-09-26 DIAGNOSIS — N039 Chronic nephritic syndrome with unspecified morphologic changes: Secondary | ICD-10-CM | POA: Diagnosis not present

## 2015-09-26 DIAGNOSIS — Z992 Dependence on renal dialysis: Secondary | ICD-10-CM | POA: Diagnosis not present

## 2015-09-26 DIAGNOSIS — D631 Anemia in chronic kidney disease: Secondary | ICD-10-CM | POA: Diagnosis not present

## 2015-09-26 DIAGNOSIS — D509 Iron deficiency anemia, unspecified: Secondary | ICD-10-CM | POA: Diagnosis not present

## 2015-09-28 DIAGNOSIS — D509 Iron deficiency anemia, unspecified: Secondary | ICD-10-CM | POA: Diagnosis not present

## 2015-09-28 DIAGNOSIS — N2581 Secondary hyperparathyroidism of renal origin: Secondary | ICD-10-CM | POA: Diagnosis not present

## 2015-09-28 DIAGNOSIS — N186 End stage renal disease: Secondary | ICD-10-CM | POA: Diagnosis not present

## 2015-09-28 DIAGNOSIS — D631 Anemia in chronic kidney disease: Secondary | ICD-10-CM | POA: Diagnosis not present

## 2015-09-30 DIAGNOSIS — D631 Anemia in chronic kidney disease: Secondary | ICD-10-CM | POA: Diagnosis not present

## 2015-09-30 DIAGNOSIS — N186 End stage renal disease: Secondary | ICD-10-CM | POA: Diagnosis not present

## 2015-09-30 DIAGNOSIS — N2581 Secondary hyperparathyroidism of renal origin: Secondary | ICD-10-CM | POA: Diagnosis not present

## 2015-09-30 DIAGNOSIS — D509 Iron deficiency anemia, unspecified: Secondary | ICD-10-CM | POA: Diagnosis not present

## 2015-10-03 DIAGNOSIS — N2581 Secondary hyperparathyroidism of renal origin: Secondary | ICD-10-CM | POA: Diagnosis not present

## 2015-10-03 DIAGNOSIS — N186 End stage renal disease: Secondary | ICD-10-CM | POA: Diagnosis not present

## 2015-10-03 DIAGNOSIS — D509 Iron deficiency anemia, unspecified: Secondary | ICD-10-CM | POA: Diagnosis not present

## 2015-10-03 DIAGNOSIS — D631 Anemia in chronic kidney disease: Secondary | ICD-10-CM | POA: Diagnosis not present

## 2015-10-05 DIAGNOSIS — N186 End stage renal disease: Secondary | ICD-10-CM | POA: Diagnosis not present

## 2015-10-05 DIAGNOSIS — D631 Anemia in chronic kidney disease: Secondary | ICD-10-CM | POA: Diagnosis not present

## 2015-10-05 DIAGNOSIS — N2581 Secondary hyperparathyroidism of renal origin: Secondary | ICD-10-CM | POA: Diagnosis not present

## 2015-10-05 DIAGNOSIS — D509 Iron deficiency anemia, unspecified: Secondary | ICD-10-CM | POA: Diagnosis not present

## 2015-10-07 DIAGNOSIS — N186 End stage renal disease: Secondary | ICD-10-CM | POA: Diagnosis not present

## 2015-10-07 DIAGNOSIS — D509 Iron deficiency anemia, unspecified: Secondary | ICD-10-CM | POA: Diagnosis not present

## 2015-10-07 DIAGNOSIS — N2581 Secondary hyperparathyroidism of renal origin: Secondary | ICD-10-CM | POA: Diagnosis not present

## 2015-10-07 DIAGNOSIS — D631 Anemia in chronic kidney disease: Secondary | ICD-10-CM | POA: Diagnosis not present

## 2015-10-10 DIAGNOSIS — D509 Iron deficiency anemia, unspecified: Secondary | ICD-10-CM | POA: Diagnosis not present

## 2015-10-10 DIAGNOSIS — D631 Anemia in chronic kidney disease: Secondary | ICD-10-CM | POA: Diagnosis not present

## 2015-10-10 DIAGNOSIS — N2581 Secondary hyperparathyroidism of renal origin: Secondary | ICD-10-CM | POA: Diagnosis not present

## 2015-10-10 DIAGNOSIS — N186 End stage renal disease: Secondary | ICD-10-CM | POA: Diagnosis not present

## 2015-10-12 DIAGNOSIS — D509 Iron deficiency anemia, unspecified: Secondary | ICD-10-CM | POA: Diagnosis not present

## 2015-10-12 DIAGNOSIS — D631 Anemia in chronic kidney disease: Secondary | ICD-10-CM | POA: Diagnosis not present

## 2015-10-12 DIAGNOSIS — N2581 Secondary hyperparathyroidism of renal origin: Secondary | ICD-10-CM | POA: Diagnosis not present

## 2015-10-12 DIAGNOSIS — N186 End stage renal disease: Secondary | ICD-10-CM | POA: Diagnosis not present

## 2015-10-14 DIAGNOSIS — D509 Iron deficiency anemia, unspecified: Secondary | ICD-10-CM | POA: Diagnosis not present

## 2015-10-14 DIAGNOSIS — D631 Anemia in chronic kidney disease: Secondary | ICD-10-CM | POA: Diagnosis not present

## 2015-10-14 DIAGNOSIS — N186 End stage renal disease: Secondary | ICD-10-CM | POA: Diagnosis not present

## 2015-10-14 DIAGNOSIS — N2581 Secondary hyperparathyroidism of renal origin: Secondary | ICD-10-CM | POA: Diagnosis not present

## 2015-10-17 DIAGNOSIS — D631 Anemia in chronic kidney disease: Secondary | ICD-10-CM | POA: Diagnosis not present

## 2015-10-17 DIAGNOSIS — N2581 Secondary hyperparathyroidism of renal origin: Secondary | ICD-10-CM | POA: Diagnosis not present

## 2015-10-17 DIAGNOSIS — N186 End stage renal disease: Secondary | ICD-10-CM | POA: Diagnosis not present

## 2015-10-17 DIAGNOSIS — D509 Iron deficiency anemia, unspecified: Secondary | ICD-10-CM | POA: Diagnosis not present

## 2015-10-19 DIAGNOSIS — N186 End stage renal disease: Secondary | ICD-10-CM | POA: Diagnosis not present

## 2015-10-19 DIAGNOSIS — D631 Anemia in chronic kidney disease: Secondary | ICD-10-CM | POA: Diagnosis not present

## 2015-10-19 DIAGNOSIS — N2581 Secondary hyperparathyroidism of renal origin: Secondary | ICD-10-CM | POA: Diagnosis not present

## 2015-10-19 DIAGNOSIS — D509 Iron deficiency anemia, unspecified: Secondary | ICD-10-CM | POA: Diagnosis not present

## 2015-10-21 DIAGNOSIS — N2581 Secondary hyperparathyroidism of renal origin: Secondary | ICD-10-CM | POA: Diagnosis not present

## 2015-10-21 DIAGNOSIS — D509 Iron deficiency anemia, unspecified: Secondary | ICD-10-CM | POA: Diagnosis not present

## 2015-10-21 DIAGNOSIS — D631 Anemia in chronic kidney disease: Secondary | ICD-10-CM | POA: Diagnosis not present

## 2015-10-21 DIAGNOSIS — N186 End stage renal disease: Secondary | ICD-10-CM | POA: Diagnosis not present

## 2015-10-24 DIAGNOSIS — D509 Iron deficiency anemia, unspecified: Secondary | ICD-10-CM | POA: Diagnosis not present

## 2015-10-24 DIAGNOSIS — D631 Anemia in chronic kidney disease: Secondary | ICD-10-CM | POA: Diagnosis not present

## 2015-10-24 DIAGNOSIS — N2581 Secondary hyperparathyroidism of renal origin: Secondary | ICD-10-CM | POA: Diagnosis not present

## 2015-10-24 DIAGNOSIS — N186 End stage renal disease: Secondary | ICD-10-CM | POA: Diagnosis not present

## 2015-10-25 DIAGNOSIS — T82318A Breakdown (mechanical) of other vascular grafts, initial encounter: Secondary | ICD-10-CM | POA: Diagnosis not present

## 2015-10-25 DIAGNOSIS — I10 Essential (primary) hypertension: Secondary | ICD-10-CM | POA: Diagnosis not present

## 2015-10-25 DIAGNOSIS — I6523 Occlusion and stenosis of bilateral carotid arteries: Secondary | ICD-10-CM | POA: Diagnosis not present

## 2015-10-25 DIAGNOSIS — N186 End stage renal disease: Secondary | ICD-10-CM | POA: Diagnosis not present

## 2015-10-25 DIAGNOSIS — Y841 Kidney dialysis as the cause of abnormal reaction of the patient, or of later complication, without mention of misadventure at the time of the procedure: Secondary | ICD-10-CM | POA: Diagnosis not present

## 2015-10-25 DIAGNOSIS — T82858A Stenosis of vascular prosthetic devices, implants and grafts, initial encounter: Secondary | ICD-10-CM | POA: Diagnosis not present

## 2015-10-25 DIAGNOSIS — Z992 Dependence on renal dialysis: Secondary | ICD-10-CM | POA: Diagnosis not present

## 2015-10-26 DIAGNOSIS — N2581 Secondary hyperparathyroidism of renal origin: Secondary | ICD-10-CM | POA: Diagnosis not present

## 2015-10-26 DIAGNOSIS — D631 Anemia in chronic kidney disease: Secondary | ICD-10-CM | POA: Diagnosis not present

## 2015-10-26 DIAGNOSIS — D509 Iron deficiency anemia, unspecified: Secondary | ICD-10-CM | POA: Diagnosis not present

## 2015-10-26 DIAGNOSIS — N186 End stage renal disease: Secondary | ICD-10-CM | POA: Diagnosis not present

## 2015-10-27 DIAGNOSIS — Z992 Dependence on renal dialysis: Secondary | ICD-10-CM | POA: Diagnosis not present

## 2015-10-27 DIAGNOSIS — N186 End stage renal disease: Secondary | ICD-10-CM | POA: Diagnosis not present

## 2015-10-27 DIAGNOSIS — N039 Chronic nephritic syndrome with unspecified morphologic changes: Secondary | ICD-10-CM | POA: Diagnosis not present

## 2015-10-28 DIAGNOSIS — D631 Anemia in chronic kidney disease: Secondary | ICD-10-CM | POA: Diagnosis not present

## 2015-10-28 DIAGNOSIS — D509 Iron deficiency anemia, unspecified: Secondary | ICD-10-CM | POA: Diagnosis not present

## 2015-10-28 DIAGNOSIS — N186 End stage renal disease: Secondary | ICD-10-CM | POA: Diagnosis not present

## 2015-10-28 DIAGNOSIS — N2581 Secondary hyperparathyroidism of renal origin: Secondary | ICD-10-CM | POA: Diagnosis not present

## 2015-10-31 DIAGNOSIS — D631 Anemia in chronic kidney disease: Secondary | ICD-10-CM | POA: Diagnosis not present

## 2015-10-31 DIAGNOSIS — D509 Iron deficiency anemia, unspecified: Secondary | ICD-10-CM | POA: Diagnosis not present

## 2015-10-31 DIAGNOSIS — N186 End stage renal disease: Secondary | ICD-10-CM | POA: Diagnosis not present

## 2015-10-31 DIAGNOSIS — N2581 Secondary hyperparathyroidism of renal origin: Secondary | ICD-10-CM | POA: Diagnosis not present

## 2015-11-02 DIAGNOSIS — N2581 Secondary hyperparathyroidism of renal origin: Secondary | ICD-10-CM | POA: Diagnosis not present

## 2015-11-02 DIAGNOSIS — D509 Iron deficiency anemia, unspecified: Secondary | ICD-10-CM | POA: Diagnosis not present

## 2015-11-02 DIAGNOSIS — N186 End stage renal disease: Secondary | ICD-10-CM | POA: Diagnosis not present

## 2015-11-02 DIAGNOSIS — D631 Anemia in chronic kidney disease: Secondary | ICD-10-CM | POA: Diagnosis not present

## 2015-11-03 ENCOUNTER — Other Ambulatory Visit: Payer: Self-pay | Admitting: Gynecology

## 2015-11-03 ENCOUNTER — Ambulatory Visit (INDEPENDENT_AMBULATORY_CARE_PROVIDER_SITE_OTHER): Payer: Medicare Other

## 2015-11-03 ENCOUNTER — Ambulatory Visit (INDEPENDENT_AMBULATORY_CARE_PROVIDER_SITE_OTHER): Payer: Medicare Other | Admitting: Gynecology

## 2015-11-03 ENCOUNTER — Encounter: Payer: Self-pay | Admitting: Gynecology

## 2015-11-03 VITALS — BP 132/80 | Ht 61.0 in | Wt 224.0 lb

## 2015-11-03 DIAGNOSIS — N852 Hypertrophy of uterus: Secondary | ICD-10-CM

## 2015-11-03 DIAGNOSIS — R938 Abnormal findings on diagnostic imaging of other specified body structures: Secondary | ICD-10-CM | POA: Diagnosis not present

## 2015-11-03 DIAGNOSIS — D251 Intramural leiomyoma of uterus: Secondary | ICD-10-CM | POA: Diagnosis not present

## 2015-11-03 DIAGNOSIS — R9389 Abnormal findings on diagnostic imaging of other specified body structures: Secondary | ICD-10-CM | POA: Insufficient documentation

## 2015-11-03 DIAGNOSIS — N8502 Endometrial intraepithelial neoplasia [EIN]: Secondary | ICD-10-CM | POA: Diagnosis not present

## 2015-11-03 HISTORY — DX: Abnormal findings on diagnostic imaging of other specified body structures: R93.89

## 2015-11-03 NOTE — Patient Instructions (Signed)

## 2015-11-03 NOTE — Addendum Note (Signed)
Addended by: Burnett Kanaris on: 11/03/2015 05:08 PM   Modules accepted: Orders

## 2015-11-03 NOTE — Progress Notes (Signed)
   Patient is a 60 year old who was seen in the office on July 20 of this year for her annual exam. Patient with multiple comorbidities including chronic kidney disease for which she receives periodic dialysis treatment. Review of her record indicated that in 2014 after having had resectoscopic polypectomy as a result of postmenopausal bleeding. The pathology report demonstrated benign endometrial polyp. She denies any vaginal bleeding dissected patient's morbidly obese and has history of fibroid uterus had asked her to return today for follow-up ultrasound to compare with previous study back in 2014. She was otherwise asymptomatic.  Her ultrasound demonstrated the following: Uterus measured 9.2 x 7.5 x 5.0 cm with an endometrial stripe of 8.7 mm patient had 5 fibroids the largest one measuring 24 x 22 mm which was calcified and all were intramural. Right and left ovary were otherwise normal.  When compared with ultrasound in 2014 fibroids have remained the same uterus is same size but her endometrial thickness is of concerning today although she's had no vaginal bleeding and is on no hormone replacement therapy. For this reason I recommended the patient undergo an endometrial biopsy today.  Procedure note: The cervix was cleansed with Betadine solution. A single-tooth tenaculum was placed on the anterior cervical lip. A sterile Pipelle was introduced into the uterine cavity and tissue was obtained and cemented histological evaluation. The uterus sounded to 7 cm. Patient tolerated procedure well.  Assessment/plan: Postmenopausal patient was stable fibroid uterus past history resectoscopic polypectomy in 2014. Patient reports no vaginal bleeding and on no hormone replacement therapy. Due to the fact that she had an endometrial thickness of 8.7 mm it was decided to do an endometrial biopsy today for which results are pending. She will return back to the office in the next 1-2 weeks for sonohysterogram to  better assess the intrauterine cavity.

## 2015-11-04 ENCOUNTER — Other Ambulatory Visit: Payer: Self-pay | Admitting: Anesthesiology

## 2015-11-04 DIAGNOSIS — D251 Intramural leiomyoma of uterus: Secondary | ICD-10-CM

## 2015-11-04 DIAGNOSIS — D631 Anemia in chronic kidney disease: Secondary | ICD-10-CM | POA: Diagnosis not present

## 2015-11-04 DIAGNOSIS — N2581 Secondary hyperparathyroidism of renal origin: Secondary | ICD-10-CM | POA: Diagnosis not present

## 2015-11-04 DIAGNOSIS — N186 End stage renal disease: Secondary | ICD-10-CM | POA: Diagnosis not present

## 2015-11-04 DIAGNOSIS — D509 Iron deficiency anemia, unspecified: Secondary | ICD-10-CM | POA: Diagnosis not present

## 2015-11-07 ENCOUNTER — Other Ambulatory Visit: Payer: Self-pay | Admitting: Gynecology

## 2015-11-07 DIAGNOSIS — D509 Iron deficiency anemia, unspecified: Secondary | ICD-10-CM | POA: Diagnosis not present

## 2015-11-07 DIAGNOSIS — N2581 Secondary hyperparathyroidism of renal origin: Secondary | ICD-10-CM | POA: Diagnosis not present

## 2015-11-07 DIAGNOSIS — N186 End stage renal disease: Secondary | ICD-10-CM | POA: Diagnosis not present

## 2015-11-07 DIAGNOSIS — D631 Anemia in chronic kidney disease: Secondary | ICD-10-CM | POA: Diagnosis not present

## 2015-11-08 ENCOUNTER — Encounter: Payer: Self-pay | Admitting: Gynecology

## 2015-11-08 ENCOUNTER — Ambulatory Visit: Payer: Medicare Other

## 2015-11-08 ENCOUNTER — Ambulatory Visit (INDEPENDENT_AMBULATORY_CARE_PROVIDER_SITE_OTHER): Payer: Medicare Other | Admitting: Gynecology

## 2015-11-08 DIAGNOSIS — N8502 Endometrial intraepithelial neoplasia [EIN]: Secondary | ICD-10-CM | POA: Diagnosis not present

## 2015-11-08 NOTE — Progress Notes (Signed)
   Patient is a 60 year old who was scheduled to undergo a sonohysterogram today as a result of ultrasound on last office visit demonstrated a thickness of the uterine lining. Patient in 2016 had resectoscopic polypectomy were by benign endometrial polyp was removed which was contributing to her postmenopausal bleeding. She reports no vaginal bleeding since that time. The note from last office visit on September 7 of this year retest follows:  "Patient is a 60 year old who was seen in the office on July 20 of this year for her annual exam. Patient with multiple comorbidities including chronic kidney disease for which she receives periodic dialysis treatment. Review of her record indicated that in 2014 after having had resectoscopic polypectomy as a result of postmenopausal bleeding. The pathology report demonstrated benign endometrial polyp. She denies any vaginal bleeding dissected patient's morbidly obese and has history of fibroid uterus had asked her to return today for follow-up ultrasound to compare with previous study back in 2014. She was otherwise asymptomatic.  Her ultrasound demonstrated the following: Uterus measured 9.2 x 7.5 x 5.0 cm with an endometrial stripe of 8.7 mm patient had 5 fibroids the largest one measuring 24 x 22 mm which was calcified and all were intramural. Right and left ovary were otherwise normal.  When compared with ultrasound in 2014 fibroids have remained the same uterus is same size but her endometrial thickness is of concerning today although she's had no vaginal bleeding and is on no hormone replacement therapy. For this reason I recommended the patient undergo an endometrial biopsy today."  At that office visit patient had an endometrial biopsy which demonstrated the following:  Diagnosis Endometrium, biopsy - COMPLEX ATYPICAL HYPERPLASIA  With this finding was decided to cancel her sonohysterogram. I have discussed with  that I have [name] Marks that  endometrial hyperplasia with atypia has a 20-30% chance of progressing to endometrial cancer and the recommendation would be with a hysterectomy with removal of tubes and ovary. Since she has a history of chronic renal disease and is not interested in surgery I will going to discuss the case with the GYN oncologist to see if we can put on a high progesterone such as Megace 80 mg daily and then resample her in 3 months or place a Mirena IUD. I will contact the patient after I discussed it with GYN oncologist. Literature information was provided  Greater 50% time was spent counseling quantity care of for this patient with complex hyperplasia with atypia. Time of consultation 15 minutes

## 2015-11-08 NOTE — Patient Instructions (Signed)
Hysterectomy Information   A hysterectomy is a surgery in which your uterus is removed. This surgery may be done to treat various medical problems. After the surgery, you will no longer have menstrual periods. The surgery will also make you unable to become pregnant (sterile). The fallopian tubes and ovaries can be removed (bilateral salpingo-oophorectomy) during this surgery as well.   REASONS FOR A HYSTERECTOMY  · Persistent, abnormal bleeding.  · Lasting (chronic) pelvic pain or infection.  · The lining of the uterus (endometrium) starts growing outside the uterus (endometriosis).  · The endometrium starts growing in the muscle of the uterus (adenomyosis).  · The uterus falls down into the vagina (pelvic organ prolapse).  · Noncancerous growths in the uterus (uterine fibroids) that cause symptoms.  · Precancerous cells.  · Cervical cancer or uterine cancer.  TYPES OF HYSTERECTOMIES  · Supracervical hysterectomy--In this type, the top part of the uterus is removed, but not the cervix.  · Total hysterectomy--The uterus and cervix are removed.  · Radical hysterectomy--The uterus, the cervix, and the fibrous tissue that holds the uterus in place in the pelvis (parametrium) are removed.  WAYS A HYSTERECTOMY CAN BE PERFORMED  · Abdominal hysterectomy--A large surgical cut (incision) is made in the abdomen. The uterus is removed through this incision.  · Vaginal hysterectomy--An incision is made in the vagina. The uterus is removed through this incision. There are no abdominal incisions.  · Conventional laparoscopic hysterectomy--Three or four small incisions are made in the abdomen. A thin, lighted tube with a camera (laparoscope) is inserted into one of the incisions. Other tools are put through the other incisions. The uterus is cut into small pieces. The small pieces are removed through the incisions, or they are removed through the vagina.  · Laparoscopically assisted vaginal hysterectomy (LAVH)--Three or four  small incisions are made in the abdomen. Part of the surgery is performed laparoscopically and part vaginally. The uterus is removed through the vagina.  · Robot-assisted laparoscopic hysterectomy--A laparoscope and other tools are inserted into 3 or 4 small incisions in the abdomen. A computer-controlled device is used to give the surgeon a 3D image and to help control the surgical instruments. This allows for more precise movements of surgical instruments. The uterus is cut into small pieces and removed through the incisions or removed through the vagina.  RISKS AND COMPLICATIONS   Possible complications associated with this procedure include:  · Bleeding and risk of blood transfusion. Tell your health care provider if you do not want to receive any blood products.  · Blood clots in the legs or lung.  · Infection.  · Injury to surrounding organs.  · Problems or side effects related to anesthesia.  · Conversion to an abdominal hysterectomy from one of the other techniques.  WHAT TO EXPECT AFTER A HYSTERECTOMY  · You will be given pain medicine.  · You will need to have someone with you for the first 3-5 days after you go home.  · You will need to follow up with your surgeon in 2-4 weeks after surgery to evaluate your progress.  · You may have early menopause symptoms such as hot flashes, night sweats, and insomnia.  · If you had a hysterectomy for a problem that was not cancer or not a condition that could lead to cancer, then you no longer need Pap tests. However, even if you no longer need a Pap test, a regular exam is a good idea to make sure no   other problems are starting.     This information is not intended to replace advice given to you by your health care provider. Make sure you discuss any questions you have with your health care provider.     Document Released: 08/08/2000 Document Revised: 12/03/2012 Document Reviewed: 10/20/2012  Elsevier Interactive Patient Education ©2016 [employer]

## 2015-11-09 ENCOUNTER — Telehealth: Payer: Self-pay | Admitting: Gynecology

## 2015-11-09 DIAGNOSIS — D631 Anemia in chronic kidney disease: Secondary | ICD-10-CM | POA: Diagnosis not present

## 2015-11-09 DIAGNOSIS — N2581 Secondary hyperparathyroidism of renal origin: Secondary | ICD-10-CM | POA: Diagnosis not present

## 2015-11-09 DIAGNOSIS — D509 Iron deficiency anemia, unspecified: Secondary | ICD-10-CM | POA: Diagnosis not present

## 2015-11-09 DIAGNOSIS — N186 End stage renal disease: Secondary | ICD-10-CM | POA: Diagnosis not present

## 2015-11-09 NOTE — Telephone Encounter (Signed)
Just spoke with patient this afternoon to discuss with her my earlier conversation with GYN oncologist Dr. Everitt Amber in reference to patient's complex hyperplasia with atypia. Patient with multiple comorbidities especially chronic renal disease for which she is on dialysis. The recommendation would be history of subjective her to surgery to place a Mirena IUD and resampling her in 3-6 months or sooner she has any bleeding. At which time that she would recommend a hysterectomy only if any abnormalities noted on a future endometrial biopsy or she begins to have bleeding issues. I've discussed this in detail with the patient she fully agrees and we will schedule the next few weeks here in the office.

## 2015-11-10 ENCOUNTER — Telehealth: Payer: Self-pay

## 2015-11-10 NOTE — Telephone Encounter (Signed)
Medicare does not cover Mirena IUD for any diagnosis.  Patient was informed said she cannot afford the $445.00 allowable Medicare cost.  I told her I would inform you.

## 2015-11-10 NOTE — Telephone Encounter (Signed)
Talk with [name] Marks about the program with [name] IUD which is the same and cheaper but good for three years which will help for the indication it is recommended

## 2015-11-10 NOTE — Telephone Encounter (Signed)
-----   Message from Terrance Mass, MD sent at 11/09/2015  4:22 PM EDT ----- Juliann Pulse, please schedule this patient for placement of a Mirena IUD in the next 1-2 weeks. I have contacted the patient and she fully agrees with a recommendation from the GYN oncologist to place a Mirena IUD because of her complex hyperplasia with atypia and then to resample her in 3-6 months. We will need insurance approval for this 60 year old to have the [place] IUD placed for this medical indication. Please let me know. ----- Message ----- From: Ramond Craver, RMA Sent: 11/07/2015  11:09 AM To: Terrance Mass, MD

## 2015-11-11 DIAGNOSIS — N2581 Secondary hyperparathyroidism of renal origin: Secondary | ICD-10-CM | POA: Diagnosis not present

## 2015-11-11 DIAGNOSIS — N186 End stage renal disease: Secondary | ICD-10-CM | POA: Diagnosis not present

## 2015-11-11 DIAGNOSIS — D631 Anemia in chronic kidney disease: Secondary | ICD-10-CM | POA: Diagnosis not present

## 2015-11-11 DIAGNOSIS — D509 Iron deficiency anemia, unspecified: Secondary | ICD-10-CM | POA: Diagnosis not present

## 2015-11-14 DIAGNOSIS — D509 Iron deficiency anemia, unspecified: Secondary | ICD-10-CM | POA: Diagnosis not present

## 2015-11-14 DIAGNOSIS — D631 Anemia in chronic kidney disease: Secondary | ICD-10-CM | POA: Diagnosis not present

## 2015-11-14 DIAGNOSIS — N186 End stage renal disease: Secondary | ICD-10-CM | POA: Diagnosis not present

## 2015-11-14 DIAGNOSIS — N2581 Secondary hyperparathyroidism of renal origin: Secondary | ICD-10-CM | POA: Diagnosis not present

## 2015-11-16 DIAGNOSIS — D509 Iron deficiency anemia, unspecified: Secondary | ICD-10-CM | POA: Diagnosis not present

## 2015-11-16 DIAGNOSIS — D631 Anemia in chronic kidney disease: Secondary | ICD-10-CM | POA: Diagnosis not present

## 2015-11-16 DIAGNOSIS — N186 End stage renal disease: Secondary | ICD-10-CM | POA: Diagnosis not present

## 2015-11-16 DIAGNOSIS — N2581 Secondary hyperparathyroidism of renal origin: Secondary | ICD-10-CM | POA: Diagnosis not present

## 2015-11-18 DIAGNOSIS — D509 Iron deficiency anemia, unspecified: Secondary | ICD-10-CM | POA: Diagnosis not present

## 2015-11-18 DIAGNOSIS — D631 Anemia in chronic kidney disease: Secondary | ICD-10-CM | POA: Diagnosis not present

## 2015-11-18 DIAGNOSIS — N186 End stage renal disease: Secondary | ICD-10-CM | POA: Diagnosis not present

## 2015-11-18 DIAGNOSIS — N2581 Secondary hyperparathyroidism of renal origin: Secondary | ICD-10-CM | POA: Diagnosis not present

## 2015-11-21 DIAGNOSIS — D631 Anemia in chronic kidney disease: Secondary | ICD-10-CM | POA: Diagnosis not present

## 2015-11-21 DIAGNOSIS — N2581 Secondary hyperparathyroidism of renal origin: Secondary | ICD-10-CM | POA: Diagnosis not present

## 2015-11-21 DIAGNOSIS — D509 Iron deficiency anemia, unspecified: Secondary | ICD-10-CM | POA: Diagnosis not present

## 2015-11-21 DIAGNOSIS — N186 End stage renal disease: Secondary | ICD-10-CM | POA: Diagnosis not present

## 2015-11-21 MED ORDER — MEGESTROL ACETATE 40 MG PO TABS: 40.0000 mg | ORAL_TABLET | Freq: Two times a day (BID) | ORAL | 2 refills | Status: DC

## 2015-11-21 NOTE — Telephone Encounter (Signed)
We reached back out to Dr Denman George per Dr Sandrea Hughs recommendation for more advice on the patient's case as the IUD was not going to be covered by the patient's insurance and it would be a huge financial burden on the patient. Dr. Denman George advise Megace 40mg  BID.  Dr Toney Rakes advised to take this for 3 months then have an endo biopsy. Pt informed and Rx sent and apt made for Feb 16 2016. KW CMA

## 2015-11-23 DIAGNOSIS — D631 Anemia in chronic kidney disease: Secondary | ICD-10-CM | POA: Diagnosis not present

## 2015-11-23 DIAGNOSIS — N186 End stage renal disease: Secondary | ICD-10-CM | POA: Diagnosis not present

## 2015-11-23 DIAGNOSIS — N2581 Secondary hyperparathyroidism of renal origin: Secondary | ICD-10-CM | POA: Diagnosis not present

## 2015-11-23 DIAGNOSIS — D509 Iron deficiency anemia, unspecified: Secondary | ICD-10-CM | POA: Diagnosis not present

## 2015-11-25 DIAGNOSIS — N2581 Secondary hyperparathyroidism of renal origin: Secondary | ICD-10-CM | POA: Diagnosis not present

## 2015-11-25 DIAGNOSIS — N186 End stage renal disease: Secondary | ICD-10-CM | POA: Diagnosis not present

## 2015-11-25 DIAGNOSIS — D631 Anemia in chronic kidney disease: Secondary | ICD-10-CM | POA: Diagnosis not present

## 2015-11-25 DIAGNOSIS — D509 Iron deficiency anemia, unspecified: Secondary | ICD-10-CM | POA: Diagnosis not present

## 2015-11-26 DIAGNOSIS — N039 Chronic nephritic syndrome with unspecified morphologic changes: Secondary | ICD-10-CM | POA: Diagnosis not present

## 2015-11-26 DIAGNOSIS — Z992 Dependence on renal dialysis: Secondary | ICD-10-CM | POA: Diagnosis not present

## 2015-11-26 DIAGNOSIS — N186 End stage renal disease: Secondary | ICD-10-CM | POA: Diagnosis not present

## 2015-11-28 DIAGNOSIS — D631 Anemia in chronic kidney disease: Secondary | ICD-10-CM | POA: Diagnosis not present

## 2015-11-28 DIAGNOSIS — Z23 Encounter for immunization: Secondary | ICD-10-CM | POA: Diagnosis not present

## 2015-11-28 DIAGNOSIS — N2581 Secondary hyperparathyroidism of renal origin: Secondary | ICD-10-CM | POA: Diagnosis not present

## 2015-11-28 DIAGNOSIS — N186 End stage renal disease: Secondary | ICD-10-CM | POA: Diagnosis not present

## 2015-11-30 DIAGNOSIS — D631 Anemia in chronic kidney disease: Secondary | ICD-10-CM | POA: Diagnosis not present

## 2015-11-30 DIAGNOSIS — N186 End stage renal disease: Secondary | ICD-10-CM | POA: Diagnosis not present

## 2015-11-30 DIAGNOSIS — N2581 Secondary hyperparathyroidism of renal origin: Secondary | ICD-10-CM | POA: Diagnosis not present

## 2015-11-30 DIAGNOSIS — Z23 Encounter for immunization: Secondary | ICD-10-CM | POA: Diagnosis not present

## 2015-12-02 DIAGNOSIS — N186 End stage renal disease: Secondary | ICD-10-CM | POA: Diagnosis not present

## 2015-12-02 DIAGNOSIS — N2581 Secondary hyperparathyroidism of renal origin: Secondary | ICD-10-CM | POA: Diagnosis not present

## 2015-12-02 DIAGNOSIS — Z23 Encounter for immunization: Secondary | ICD-10-CM | POA: Diagnosis not present

## 2015-12-02 DIAGNOSIS — D631 Anemia in chronic kidney disease: Secondary | ICD-10-CM | POA: Diagnosis not present

## 2015-12-05 DIAGNOSIS — D631 Anemia in chronic kidney disease: Secondary | ICD-10-CM | POA: Diagnosis not present

## 2015-12-05 DIAGNOSIS — N186 End stage renal disease: Secondary | ICD-10-CM | POA: Diagnosis not present

## 2015-12-05 DIAGNOSIS — Z23 Encounter for immunization: Secondary | ICD-10-CM | POA: Diagnosis not present

## 2015-12-05 DIAGNOSIS — N2581 Secondary hyperparathyroidism of renal origin: Secondary | ICD-10-CM | POA: Diagnosis not present

## 2015-12-07 DIAGNOSIS — D631 Anemia in chronic kidney disease: Secondary | ICD-10-CM | POA: Diagnosis not present

## 2015-12-07 DIAGNOSIS — N186 End stage renal disease: Secondary | ICD-10-CM | POA: Diagnosis not present

## 2015-12-07 DIAGNOSIS — Z23 Encounter for immunization: Secondary | ICD-10-CM | POA: Diagnosis not present

## 2015-12-07 DIAGNOSIS — N2581 Secondary hyperparathyroidism of renal origin: Secondary | ICD-10-CM | POA: Diagnosis not present

## 2015-12-09 DIAGNOSIS — D631 Anemia in chronic kidney disease: Secondary | ICD-10-CM | POA: Diagnosis not present

## 2015-12-09 DIAGNOSIS — N2581 Secondary hyperparathyroidism of renal origin: Secondary | ICD-10-CM | POA: Diagnosis not present

## 2015-12-09 DIAGNOSIS — N186 End stage renal disease: Secondary | ICD-10-CM | POA: Diagnosis not present

## 2015-12-09 DIAGNOSIS — Z23 Encounter for immunization: Secondary | ICD-10-CM | POA: Diagnosis not present

## 2015-12-12 DIAGNOSIS — N186 End stage renal disease: Secondary | ICD-10-CM | POA: Diagnosis not present

## 2015-12-12 DIAGNOSIS — N2581 Secondary hyperparathyroidism of renal origin: Secondary | ICD-10-CM | POA: Diagnosis not present

## 2015-12-12 DIAGNOSIS — Z23 Encounter for immunization: Secondary | ICD-10-CM | POA: Diagnosis not present

## 2015-12-12 DIAGNOSIS — D631 Anemia in chronic kidney disease: Secondary | ICD-10-CM | POA: Diagnosis not present

## 2015-12-14 DIAGNOSIS — N2581 Secondary hyperparathyroidism of renal origin: Secondary | ICD-10-CM | POA: Diagnosis not present

## 2015-12-14 DIAGNOSIS — Z23 Encounter for immunization: Secondary | ICD-10-CM | POA: Diagnosis not present

## 2015-12-14 DIAGNOSIS — D631 Anemia in chronic kidney disease: Secondary | ICD-10-CM | POA: Diagnosis not present

## 2015-12-14 DIAGNOSIS — N186 End stage renal disease: Secondary | ICD-10-CM | POA: Diagnosis not present

## 2015-12-16 DIAGNOSIS — N186 End stage renal disease: Secondary | ICD-10-CM | POA: Diagnosis not present

## 2015-12-16 DIAGNOSIS — D631 Anemia in chronic kidney disease: Secondary | ICD-10-CM | POA: Diagnosis not present

## 2015-12-16 DIAGNOSIS — N2581 Secondary hyperparathyroidism of renal origin: Secondary | ICD-10-CM | POA: Diagnosis not present

## 2015-12-16 DIAGNOSIS — Z23 Encounter for immunization: Secondary | ICD-10-CM | POA: Diagnosis not present

## 2015-12-19 DIAGNOSIS — Z23 Encounter for immunization: Secondary | ICD-10-CM | POA: Diagnosis not present

## 2015-12-19 DIAGNOSIS — D631 Anemia in chronic kidney disease: Secondary | ICD-10-CM | POA: Diagnosis not present

## 2015-12-19 DIAGNOSIS — N186 End stage renal disease: Secondary | ICD-10-CM | POA: Diagnosis not present

## 2015-12-19 DIAGNOSIS — N2581 Secondary hyperparathyroidism of renal origin: Secondary | ICD-10-CM | POA: Diagnosis not present

## 2015-12-20 ENCOUNTER — Ambulatory Visit (INDEPENDENT_AMBULATORY_CARE_PROVIDER_SITE_OTHER): Payer: Medicare Other | Admitting: Vascular Surgery

## 2015-12-20 ENCOUNTER — Encounter (INDEPENDENT_AMBULATORY_CARE_PROVIDER_SITE_OTHER): Payer: Self-pay | Admitting: Vascular Surgery

## 2015-12-20 VITALS — BP 141/71 | HR 84 | Resp 16 | Ht 61.0 in | Wt 223.0 lb

## 2015-12-20 DIAGNOSIS — T829XXA Unspecified complication of cardiac and vascular prosthetic device, implant and graft, initial encounter: Secondary | ICD-10-CM

## 2015-12-20 DIAGNOSIS — M79601 Pain in right arm: Secondary | ICD-10-CM | POA: Insufficient documentation

## 2015-12-20 DIAGNOSIS — N186 End stage renal disease: Secondary | ICD-10-CM

## 2015-12-20 DIAGNOSIS — T829XXD Unspecified complication of cardiac and vascular prosthetic device, implant and graft, subsequent encounter: Secondary | ICD-10-CM | POA: Diagnosis not present

## 2015-12-20 DIAGNOSIS — M7989 Other specified soft tissue disorders: Secondary | ICD-10-CM

## 2015-12-20 HISTORY — DX: Unspecified complication of cardiac and vascular prosthetic device, implant and graft, initial encounter: T82.9XXA

## 2015-12-20 HISTORY — DX: Pain in right arm: M79.601

## 2015-12-20 HISTORY — DX: Pain in right arm: M79.89

## 2015-12-20 NOTE — Progress Notes (Signed)
Subjective:    Patient ID: [name] Marks, female    DOB: October 04, 1955, 60 y.o.   MRN: [id number] Chief Complaint  Patient presents with  . Follow-up   Patient presents at the request of her dialysis center. Over the last week, the patient has noticed an increase in erythema, swelling and pain at the distal aspect of her right brachial artery to axillary vein arteriovenous graft. She denies any associated fever, nausea or vomiting. Patient states a scab was pulled off her access by a dialysis tech yesterday resulting in a small wound located mid-graft. Patient denies any other issues with her access. States she is "running fine".    Review of Systems  Constitutional: Negative.   HENT: Negative.   Eyes: Negative.   Respiratory: Negative.   Cardiovascular: Negative.   Gastrointestinal: Negative.   Endocrine: Negative.   Genitourinary:       ESRD on Dialysis  Musculoskeletal: Negative.   Skin: Positive for wound (Small wound located mid graft on right upper extremity).  Allergic/Immunologic: Negative.   Neurological: Negative.   Hematological: Negative.   Psychiatric/Behavioral: Negative.       Objective:   Physical Exam  Constitutional: She is oriented to person, place, and time. She appears well-developed and well-nourished.  HENT:  Head: Normocephalic and atraumatic.  Eyes: Conjunctivae and EOM are normal. Pupils are equal, round, and reactive to light.  Neck: Normal range of motion.  Cardiovascular: Normal rate, regular rhythm, normal heart sounds and intact distal pulses.   Pulses:      Radial pulses are 2+ on the right side, and 2+ on the left side.       Dorsalis pedis pulses are 1+ on the right side, and 1+ on the left side.       Posterior tibial pulses are 1+ on the right side, and 1+ on the left side.  Right Upper Extremity Graft: Good Thrill and Bruit  Pulmonary/Chest: Effort normal and breath sounds normal.  Abdominal: Soft. Bowel sounds are normal.    Musculoskeletal: Normal range of motion.  Neurological: She is alert and oriented to person, place, and time.  Skin:  Right Upper Extremity Dialysis Access: Distal aspect of graft with erythema, tenderness and hard to palpation. No fluctuance or drainage noted. Mid-graft: small 1cm x 1cm wound. No graft showing. No infection noted. No drainage noted.    BP (!) 141/71   Pulse 84   Resp 16   Ht 5\' 1"  (1.549 m)   Wt 223 lb (101.2 kg)   BMI 42.14 kg/m   Past Medical History:  Diagnosis Date  . Complication of anesthesia    due to kidney disease  . End stage renal disease on dialysis ([place])   . GERD (gastroesophageal reflux disease)   . Gout    Social History   Social History  . Marital status: Single    Spouse name: N/A  . Number of children: N/A  . Years of education: N/A   Occupational History  . Not on file.   Social History Main Topics  . Smoking status: Never Smoker  . Smokeless tobacco: Never Used  . Alcohol use No  . Drug use: No  . Sexual activity: Yes    Birth control/ protection: Post-menopausal   Other Topics Concern  . Not on file   Social History Narrative  . No narrative on file   Past Surgical History:  Procedure Laterality Date  . DG AV DIALYSIS GRAFT DECLOT OR    .  DILATATION & CURETTAGE/HYSTEROSCOPY WITH TRUECLEAR N/A 11/06/2012   Procedure: DILATATION & CURETTAGE/HYSTEROSCOPY WITH TRUECLEAR;  Surgeon: Terrance Mass, MD;  Location: [facility];  Service: Gynecology;  Laterality: N/A;  Truclear Resectoscopic Polypectomy   . PERIPHERAL VASCULAR CATHETERIZATION N/A 09/14/2014   Procedure: A/V Shuntogram/Fistulagram;  Surgeon: Katha Cabal, MD;  Location: [facility];  Service: Cardiovascular;  Laterality: N/A;  . PERIPHERAL VASCULAR CATHETERIZATION N/A 09/14/2014   Procedure: A/V Shunt Intervention;  Surgeon: Katha Cabal, MD;  Location: [facility];  Service: Cardiovascular;  Laterality: N/A;  . PERIPHERAL VASCULAR  CATHETERIZATION Right 12/09/2014   Procedure: A/V Shuntogram/Fistulagram;  Surgeon: Algernon Huxley, MD;  Location: [facility];  Service: Cardiovascular;  Laterality: Right;  . PERIPHERAL VASCULAR CATHETERIZATION N/A 12/09/2014   Procedure: A/V Shunt Intervention;  Surgeon: Algernon Huxley, MD;  Location: [facility];  Service: Cardiovascular;  Laterality: N/A;  . PERIPHERAL VASCULAR CATHETERIZATION Right 05/24/2015   Procedure: A/V Shuntogram;  Surgeon: Serafina Mitchell, MD;  Location: [facility];  Service: Cardiovascular;  Laterality: Right;  . PERIPHERAL VASCULAR CATHETERIZATION Right 05/24/2015   Procedure: Peripheral Vascular Balloon Angioplasty;  Surgeon: Serafina Mitchell, MD;  Location: [facility];  Service: Cardiovascular;  Laterality: Right;  right arm shunt  . PERIPHERAL VASCULAR CATHETERIZATION N/A 06/13/2015   Procedure: A/V Shuntogram/Fistulagram;  Surgeon: Algernon Huxley, MD;  Location: [facility];  Service: Cardiovascular;  Laterality: N/A;  . PERIPHERAL VASCULAR CATHETERIZATION N/A 06/13/2015   Procedure: A/V Shunt Intervention;  Surgeon: Algernon Huxley, MD;  Location: [facility];  Service: Cardiovascular;  Laterality: N/A;  . TUBAL LIGATION  1983   Family History  Problem Relation Age of Onset  . Diabetes Mother   . Hypertension Mother   . Heart disease Mother   . Alcohol abuse Mother   . Kidney disease Mother   . Cancer Father     COLON  . Alcohol abuse Father   . Breast cancer Sister 54  . Hypertension Sister   . Kidney disease Sister   . Hypertension Son    Allergies  Allergen Reactions  . Contrast Media [Iodinated Diagnostic Agents] Anaphylaxis  . Ancef [Cefazolin Sodium] Nausea And Vomiting      Assessment & Plan:  Patient presents at the request of her dialysis center. Over the last week, the patient has noticed an increase in erythema, swelling and pain at the distal aspect of her right brachial artery to axillary vein  arteriovenous graft. She denies any associated fever, nausea or vomiting. Patient states a scab was pulled off her access by a dialysis tech yesterday resulting in a small wound located mid-graft. Patient denies any other issues with her access. States she is "running fine".   1. RENAL DISEASE, END STAGE - Stable Patient to continue to use graft for dialysis. Encouraged staff to stay away from area with wound.   2. Complication from renal dialysis device, subsequent encounter - New Doxycycline 100mg  PO BID and Silvedene to area BID written. Patient to follow up in one week. Patient strongly advised if wound or symptoms worsens or if she experiences fever, nausea, vomiting or drainage from the wound to call our office immediately. Patient understands if infection spreads and involves her graft she will need a permcath placed and removal or graft.   3. Pain and swelling of right upper extremity - New From possible infection located at distal aspect of graft on  ABX now.   Current Outpatient Prescriptions on File Prior to Visit  Medication Sig Dispense Refill  . albuterol (PROVENTIL HFA;VENTOLIN HFA) 108 (90 BASE) MCG/ACT inhaler Inhale 2 puffs into the lungs every 6 (six) hours as needed for wheezing.    Marland Kitchen aspirin EC 81 MG tablet Take 81 mg by mouth daily.    . cinacalcet (SENSIPAR) 60 MG tablet Take 60 mg by mouth daily.    . famotidine (PEPCID) 20 MG tablet Take 20 mg by mouth daily as needed for heartburn or indigestion.    Marland Kitchen ibuprofen (ADVIL,MOTRIN) 200 MG tablet Take 400 mg by mouth every 6 (six) hours as needed for pain.    Marland Kitchen ipratropium (ATROVENT) 0.06 % nasal spray Place 2 sprays into both nostrils 4 (four) times daily. 15 mL 1  . megestrol (MEGACE) 40 MG tablet Take 1 tablet (40 mg total) by mouth 2 (two) times daily. 60 tablet 2  . methocarbamol (ROBAXIN) 500 MG tablet Take 1 tablet (500 mg total) by mouth 2 (two) times daily. 30 tablet 0  . midodrine (PROAMATINE) 10 MG tablet Take 10 mg  by mouth 2 (two) times daily.     . Multiple Vitamins-Minerals (PRORENAL VITAL) TABS Take 1 tablet by mouth daily.    . sevelamer (RENVELA) 800 MG tablet Take 1,600 mg by mouth 3 (three) times daily with meals.      No current facility-administered medications on file prior to visit.    There are no Patient Instructions on file for this visit. Return in about 1 week (around 12/27/2015) for Wound Check.  Maclain Cohron A Arsh Feutz, PA-C

## 2015-12-21 ENCOUNTER — Emergency Department (HOSPITAL_COMMUNITY)
Admission: EM | Admit: 2015-12-21 | Discharge: 2015-12-22 | Disposition: A | Discharge status: Home / Self Care | Payer: Medicare Other | Attending: Emergency Medicine | Admitting: Emergency Medicine

## 2015-12-21 ENCOUNTER — Emergency Department (HOSPITAL_COMMUNITY): Payer: Medicare Other

## 2015-12-21 ENCOUNTER — Encounter (HOSPITAL_COMMUNITY): Payer: Self-pay | Admitting: *Deleted

## 2015-12-21 ENCOUNTER — Telehealth: Payer: Self-pay | Admitting: *Deleted

## 2015-12-21 DIAGNOSIS — M868X8 Other osteomyelitis, other site: Secondary | ICD-10-CM | POA: Insufficient documentation

## 2015-12-21 DIAGNOSIS — M25551 Pain in right hip: Secondary | ICD-10-CM

## 2015-12-21 DIAGNOSIS — Z992 Dependence on renal dialysis: Secondary | ICD-10-CM | POA: Diagnosis not present

## 2015-12-21 DIAGNOSIS — Z79899 Other long term (current) drug therapy: Secondary | ICD-10-CM | POA: Insufficient documentation

## 2015-12-21 DIAGNOSIS — Z23 Encounter for immunization: Secondary | ICD-10-CM | POA: Diagnosis not present

## 2015-12-21 DIAGNOSIS — N2581 Secondary hyperparathyroidism of renal origin: Secondary | ICD-10-CM | POA: Diagnosis not present

## 2015-12-21 DIAGNOSIS — T148XXA Other injury of unspecified body region, initial encounter: Secondary | ICD-10-CM | POA: Diagnosis not present

## 2015-12-21 DIAGNOSIS — K5730 Diverticulosis of large intestine without perforation or abscess without bleeding: Secondary | ICD-10-CM | POA: Diagnosis not present

## 2015-12-21 DIAGNOSIS — N186 End stage renal disease: Secondary | ICD-10-CM | POA: Insufficient documentation

## 2015-12-21 DIAGNOSIS — D631 Anemia in chronic kidney disease: Secondary | ICD-10-CM | POA: Diagnosis not present

## 2015-12-21 DIAGNOSIS — M869 Osteomyelitis, unspecified: Secondary | ICD-10-CM

## 2015-12-21 DIAGNOSIS — Z7982 Long term (current) use of aspirin: Secondary | ICD-10-CM | POA: Insufficient documentation

## 2015-12-21 LAB — CBC
HCT: 36 % (ref 36.0–46.0)
Hemoglobin: 11.6 g/dL — ABNORMAL LOW (ref 12.0–15.0)
MCH: 28.5 pg (ref 26.0–34.0)
MCHC: 32.2 g/dL (ref 30.0–36.0)
MCV: 88.5 fL (ref 78.0–100.0)
PLATELETS: 246 10*3/uL (ref 150–400)
RBC: 4.07 MIL/uL (ref 3.87–5.11)
RDW: 16.6 % — AB (ref 11.5–15.5)
WBC: 9.8 10*3/uL (ref 4.0–10.5)

## 2015-12-21 LAB — COMPREHENSIVE METABOLIC PANEL
ALK PHOS: 126 U/L (ref 38–126)
ALT: 15 U/L (ref 14–54)
AST: 28 U/L (ref 15–41)
Albumin: 3.6 g/dL (ref 3.5–5.0)
Anion gap: 15 (ref 5–15)
BILIRUBIN TOTAL: 1.1 mg/dL (ref 0.3–1.2)
BUN: 9 mg/dL (ref 6–20)
CALCIUM: 9.3 mg/dL (ref 8.9–10.3)
CHLORIDE: 94 mmol/L — AB (ref 101–111)
CO2: 30 mmol/L (ref 22–32)
CREATININE: 4.47 mg/dL — AB (ref 0.44–1.00)
GFR, EST AFRICAN AMERICAN: 11 mL/min — AB (ref >60)
GFR, EST NON AFRICAN AMERICAN: 10 mL/min — AB (ref >60)
Glucose, Bld: 97 mg/dL (ref 65–99)
Potassium: 3.1 mmol/L — ABNORMAL LOW (ref 3.5–5.1)
Sodium: 139 mmol/L (ref 135–145)
TOTAL PROTEIN: 7.5 g/dL (ref 6.5–8.1)

## 2015-12-21 MED ORDER — NAPROXEN 500 MG PO TABS: 500.0000 mg | ORAL_TABLET | Freq: Two times a day (BID) | ORAL | 0 refills | Status: DC | PRN

## 2015-12-21 MED ORDER — HYDROMORPHONE HCL 2 MG/ML IJ SOLN
1.0000 mg | Freq: Once | INTRAMUSCULAR | Status: AC
  Administered 2015-12-21: 1 mg via INTRAVENOUS
  Filled 2015-12-21: qty 1

## 2015-12-21 NOTE — Telephone Encounter (Signed)
Pt called c/o pelvic right side pain, sharp pain stabbing pain comes and goes, started yesterday, hurts worse with movement. Patient said with movement pain level is a 10, no bleeding, no fever. I advised patient she will need to have office visit.  Pt is at dialysis center now, she won't be finish until 11:30am there, with driving she may get around 12:15-12:30pm. Patient said she is having a hard time walking and will need a wheelchair to come in to office due to the pain and help getting up on exam table. Please advise

## 2015-12-21 NOTE — Telephone Encounter (Signed)
I called patient and gave her the option of coming to our office at 1:45pm for 2:00pm appointment and ultrasound or ER after leaving dialysis if she is unable to wait until 1:45pm. Pt decided she will just to the ER.

## 2015-12-21 NOTE — ED Triage Notes (Signed)
Pt arrived via [facility] with complaints of right hip pain since waking up yesterday.  Pt now reports right lower quadrant pain as well and reports some diarrhea.  Pt took Ibuprofen for the pain yesterday.  Pt completed dialysis today.

## 2015-12-21 NOTE — ED Notes (Signed)
Contact numbers: Travis [name] (son) [phone] Berlinda Last [phone]

## 2015-12-21 NOTE — Telephone Encounter (Signed)
My schedule is packed today I can see her in ultrasound is full she may need to go just to the emergency room unless Izora Gala can see her

## 2015-12-21 NOTE — ED Notes (Signed)
Pt taken to ct 

## 2015-12-21 NOTE — ED Provider Notes (Signed)
[facility] DEPT Provider Note   CSN: [id number] Arrival date & time: 12/21/15  1536     History   Chief Complaint Chief Complaint  Patient presents with  . Hip Pain  . Abdominal Pain    HPI [name] Marks is a 60 y.o. female with a hx of ESRD on HD, who got dialysis today, presents to the ED noting a 2 day history of progressively worsening right hip and RLQ abdominal pain associated with some watery, non-bloody diarrhea. She states that yesterday it felt as if it was more in her lateral hip, but today it seems to have localized more in her RLQ and hip area. She has had significant pain with weight bearing and ambulating as a result. No fever, chills, nausea, vomiting, vaginal bleeding or discharge, She does have a history of some abnormal cells noted in her endometrium found by her OBGYN, currently taking estrogen, but no known cancer or metastasis. No prior abdominal surgeries. She no longer makes urine. She does note some intermittent hip pain prior to this thought to be arthritic in nature, but she states that this feels different from her previous pains.   HPI  Past Medical History:  Diagnosis Date  . Complication of anesthesia    due to kidney disease  . End stage renal disease on dialysis ([place])   . GERD (gastroesophageal reflux disease)   . Gout     Patient Active Problem List   Diagnosis Date Noted  . Renal dialysis device, implant, or graft complication 09/47/0962  . Pain and swelling of right upper extremity 12/20/2015  . Complex endometrial hyperplasia with atypia 11/08/2015  . Increased endometrial stripe thickness 11/03/2015  . Obesity 10/28/2014  . Shoulder pain, bilateral 10/28/2014  . Health care maintenance 10/28/2014  . Hypotension, unspecified 01/14/2013  . Right carotid bruit 01/08/2013  . Generalized headaches 01/08/2013  . Chronic female pelvic pain 08/08/2012  . Gout, unspecified 12/06/2008  . Esophageal reflux 12/06/2008  . BACK PAIN  12/06/2008  . FIBROIDS, UTERUS 11/24/2008  . RENAL DISEASE, END STAGE 07/01/2006    Past Surgical History:  Procedure Laterality Date  . DG AV DIALYSIS GRAFT DECLOT OR    . DILATATION & CURETTAGE/HYSTEROSCOPY WITH TRUECLEAR N/A 11/06/2012   Procedure: DILATATION & CURETTAGE/HYSTEROSCOPY WITH TRUECLEAR;  Surgeon: Terrance Mass, MD;  Location: [facility];  Service: Gynecology;  Laterality: N/A;  Truclear Resectoscopic Polypectomy   . PERIPHERAL VASCULAR CATHETERIZATION N/A 09/14/2014   Procedure: A/V Shuntogram/Fistulagram;  Surgeon: Katha Cabal, MD;  Location: [facility];  Service: Cardiovascular;  Laterality: N/A;  . PERIPHERAL VASCULAR CATHETERIZATION N/A 09/14/2014   Procedure: A/V Shunt Intervention;  Surgeon: Katha Cabal, MD;  Location: [facility];  Service: Cardiovascular;  Laterality: N/A;  . PERIPHERAL VASCULAR CATHETERIZATION Right 12/09/2014   Procedure: A/V Shuntogram/Fistulagram;  Surgeon: Algernon Huxley, MD;  Location: [facility];  Service: Cardiovascular;  Laterality: Right;  . PERIPHERAL VASCULAR CATHETERIZATION N/A 12/09/2014   Procedure: A/V Shunt Intervention;  Surgeon: Algernon Huxley, MD;  Location: [facility];  Service: Cardiovascular;  Laterality: N/A;  . PERIPHERAL VASCULAR CATHETERIZATION Right 05/24/2015   Procedure: A/V Shuntogram;  Surgeon: Serafina Mitchell, MD;  Location: [facility];  Service: Cardiovascular;  Laterality: Right;  . PERIPHERAL VASCULAR CATHETERIZATION Right 05/24/2015   Procedure: Peripheral Vascular Balloon Angioplasty;  Surgeon: Serafina Mitchell, MD;  Location: [facility];  Service: Cardiovascular;  Laterality: Right;  right arm  shunt  . PERIPHERAL VASCULAR CATHETERIZATION N/A 06/13/2015   Procedure: A/V Shuntogram/Fistulagram;  Surgeon: Algernon Huxley, MD;  Location: [facility];  Service: Cardiovascular;  Laterality: N/A;  . PERIPHERAL VASCULAR CATHETERIZATION N/A 06/13/2015   Procedure:  A/V Shunt Intervention;  Surgeon: Algernon Huxley, MD;  Location: [facility];  Service: Cardiovascular;  Laterality: N/A;  . TUBAL LIGATION  1983    OB History    Gravida Para Term Preterm AB Living   2 2       2    SAB TAB Ectopic Multiple Live Births                   Home Medications    Prior to Admission medications   Medication Sig Start Date End Date Taking? Authorizing Provider  albuterol (PROVENTIL HFA;VENTOLIN HFA) 108 (90 BASE) MCG/ACT inhaler Inhale 2 puffs into the lungs every 6 (six) hours as needed for wheezing.   Yes Historical Provider, MD  aspirin EC 81 MG tablet Take 81 mg by mouth daily.   Yes Historical Provider, MD  B Complex-C-Folic Acid (RENAL VITAMIN PO) Take 1 tablet by mouth at bedtime.   Yes Historical Provider, MD  cinacalcet (SENSIPAR) 60 MG tablet Take 60 mg by mouth every evening.    Yes Historical Provider, MD  famotidine (PEPCID) 20 MG tablet Take 20 mg by mouth daily as needed for heartburn or indigestion.   Yes Historical Provider, MD  ibuprofen (ADVIL,MOTRIN) 200 MG tablet Take 400 mg by mouth every 6 (six) hours as needed for pain.   Yes Historical Provider, MD  megestrol (MEGACE) 40 MG tablet Take 1 tablet (40 mg total) by mouth 2 (two) times daily. 11/21/15  Yes Terrance Mass, MD  midodrine (PROAMATINE) 10 MG tablet Take 10 mg by mouth 2 (two) times daily.    Yes Historical Provider, MD  Multiple Vitamins-Minerals (PRORENAL VITAL) TABS Take 1 tablet by mouth daily.   Yes Historical Provider, MD  sevelamer (RENVELA) 800 MG tablet Take 1,600 mg by mouth 3 (three) times daily with meals.    Yes Historical Provider, MD  ipratropium (ATROVENT) 0.06 % nasal spray Place 2 sprays into both nostrils 4 (four) times daily. Patient not taking: Reported on 12/21/2015 05/26/13   Billy Fischer, MD  methocarbamol (ROBAXIN) 500 MG tablet Take 1 tablet (500 mg total) by mouth 2 (two) times daily. Patient not taking: Reported on 12/21/2015 08/24/15   Billy Fischer, MD  naproxen (NAPROSYN) 500 MG tablet Take 1 tablet (500 mg total) by mouth 2 (two) times daily as needed. 12/21/15   Zenovia Jarred, DO    Family History Family History  Problem Relation Age of Onset  . Diabetes Mother   . Hypertension Mother   . Heart disease Mother   . Alcohol abuse Mother   . Kidney disease Mother   . Cancer Father     COLON  . Alcohol abuse Father   . Breast cancer Sister 41  . Hypertension Sister   . Kidney disease Sister   . Hypertension Son     Social History Social History  Substance Use Topics  . Smoking status: Never Smoker  . Smokeless tobacco: Never Used  . Alcohol use No     Allergies   Contrast media [iodinated diagnostic agents] and Ancef [cefazolin sodium]   Review of Systems Review of Systems  Constitutional: Positive for activity change. Negative for appetite change, chills and fever.  Respiratory:  Negative for cough and shortness of breath.   Cardiovascular: Negative for chest pain.  Gastrointestinal: Positive for abdominal pain and diarrhea. Negative for abdominal distention, blood in stool, nausea and vomiting.  Genitourinary: Negative for flank pain, vaginal bleeding, vaginal discharge and vaginal pain.  Musculoskeletal: Positive for arthralgias (right hip) and gait problem. Negative for back pain, joint swelling, myalgias and neck pain.  Skin: Negative for rash.  Neurological: Negative for dizziness, syncope, weakness and numbness.  All other systems reviewed and are negative.    Physical Exam Updated Vital Signs BP 115/61 (BP Location: Left Arm)   Pulse 76   Temp 98.6 F (37 C) (Oral)   Resp 19   SpO2 100%   Physical Exam  Constitutional: She is oriented to person, place, and time. She appears well-developed and well-nourished. No distress.  HENT:  Head: Normocephalic and atraumatic.  Nose: Nose normal.  Mouth/Throat: Oropharynx is clear and moist.  Eyes: Conjunctivae and EOM are normal. Pupils are equal,  round, and reactive to light.  Neck: Neck supple.  Cardiovascular: Normal rate, regular rhythm, normal heart sounds and intact distal pulses.   Pulmonary/Chest: Effort normal and breath sounds normal.  Abdominal: Soft. She exhibits no distension. There is tenderness in the right lower quadrant. There is tenderness at McBurney's point. There is no rigidity, no rebound, no guarding, no CVA tenderness and negative Murphy's sign.  Musculoskeletal: She exhibits tenderness.       Right hip: She exhibits decreased range of motion, tenderness and bony tenderness. She exhibits normal strength, no swelling, no crepitus, no deformity and no laceration.       Legs: Neurological: She is alert and oriented to person, place, and time. She has normal strength. No cranial nerve deficit or sensory deficit. Coordination normal. GCS eye subscore is 4. GCS verbal subscore is 5. GCS motor subscore is 6.  Skin: Skin is warm and dry. She is not diaphoretic.  Nursing note and vitals reviewed.    ED Treatments / Results  Labs (all labs ordered are listed, but only abnormal results are displayed) Labs Reviewed  COMPREHENSIVE METABOLIC PANEL - Abnormal; Notable for the following:       Result Value   Potassium 3.1 (*)    Chloride 94 (*)    Creatinine, Ser 4.47 (*)    GFR calc non Af Amer 10 (*)    GFR calc Af Amer 11 (*)    All other components within normal limits  CBC - Abnormal; Notable for the following:    Hemoglobin 11.6 (*)    RDW 16.6 (*)    All other components within normal limits    EKG  EKG Interpretation None       Radiology Ct Abdomen Pelvis Wo Contrast  Result Date: 12/21/2015 CLINICAL DATA:  60 year old female with right lower quadrant abdominal pain. EXAM: CT ABDOMEN AND PELVIS WITHOUT CONTRAST TECHNIQUE: Multidetector CT imaging of the abdomen and pelvis was performed following the standard protocol without IV contrast. COMPARISON:  None. FINDINGS: Evaluation of this exam is limited  in the absence of intravenous contrast. Lower chest: The visualized lung bases are clear. No intra-abdominal free air or free fluid. Hepatobiliary: No focal liver abnormality is seen. No gallstones, gallbladder wall thickening, or biliary dilatation. Pancreas: Unremarkable. No pancreatic ductal dilatation or surrounding inflammatory changes. Spleen: Normal in size without focal abnormality. Adrenals/Urinary Tract: The adrenal glands appear unremarkable. The kidneys are atrophic. There are small scattered calcifications in the left kidney, parenchymal. For IV core  are or or are or carpal or are coarse there is a 1.4 cm exophytic hypodense lesion arising from the inferior pole of the right kidney which is indeterminate on this CT but measures fluid attenuation and most likely represents a cyst. There is no hydronephrosis on either side. The visualized ureters appear unremarkable. The urinary bladder is collapsed. Stomach/Bowel: There is extensive sigmoid diverticulosis with muscular hypertrophy. No active inflammatory changes. There is no evidence of bowel obstruction or active inflammation. Normal appendix. Vascular/Lymphatic: Mild aortoiliac atherosclerotic disease. Partially visualized bypass grafts in the right inguinal region and anterior thigh. The IVC appears grossly unremarkable. No portal venous gas identified. Evaluation of the vasculature is limited on this noncontrast study. There is no adenopathy. Reproductive: The uterus is mildly enlarged and myomatous. There is a 12 x 15 mm calcified fibroid arising from the posterior uterine body. Other: None Musculoskeletal: There is degenerative changes of the spine. Multilevel disc desiccation with vacuum phenomena. Grade I L4-L5 anterolisthesis. No acute fracture or suspicious lesions. Bilateral SI joint arthritic changes. IMPRESSION: No acute intra-abdominal pelvic pathology. No evidence of bowel obstruction or active inflammation. Normal appendix. Sigmoid  diverticulosis. Atrophic kidneys compatible with history of end-stage renal disease. Electronically Signed   By: Anner Crete M.D.   On: 12/21/2015 22:21   Dg Hip Unilat  With Pelvis 2-3 Views Right  Result Date: 12/21/2015 CLINICAL DATA:  Pain without trauma. EXAM: DG HIP (WITH OR WITHOUT PELVIS) 2-3V RIGHT COMPARISON:  None. FINDINGS: No fracture, subluxation, or degenerative hip narrowing. No evidence of osteonecrosis. 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 Condensing osteitis iliac and sacroiliac osteoarthritis. Osteopenia.  Probable probable femoral bypass on the right IMPRESSION: 1. No acute finding.  No degenerative hip narrowing. 2. Osteitis pubis and iliac. Electronically Signed   By: Monte Fantasia M.D.   On: 12/21/2015 17:14    Procedures Procedures (including critical care time)  Medications Ordered in ED Medications  HYDROmorphone (DILAUDID) injection 1 mg (1 mg Intravenous Given 12/21/15 2258)     Initial Impression / Assessment and Plan / ED Course  I have reviewed the triage vital signs and the nursing notes.  Pertinent labs & imaging results that were available during my care of the patient were reviewed by me and considered in my medical decision making (see chart for details).  Clinical Course   60 y.o. presents with right hip and RLQ abd pain. Exam as above.   Xray was done and showed osteitis pubis and ileitis, but no degenerative disease in the hip joint itself. No fracture.   CT abd pelvis was done and showed normal appendix, and no other acute abnormalities.  Labs returned showing no significant abnormalities.   She was given pain medication in the room and significantly improved. Feel that her sx are due to osteitis. Recommended taking NSAIDs and follow up with her PCP in a few days to monitor for improvement in her sx. Return precautions were given for worsening or concerns. This plan was discussed with the patient at  the bedside and she stated both understanding and agreement with this plan.    Final Clinical Impressions(s) / ED Diagnoses   Final diagnoses:  Right hip pain  Osteitis of pelvic region [facility])    New Prescriptions Discharge Medication List as of 12/21/2015 11:45 PM    START taking these medications   Details  naproxen (NAPROSYN) 500 MG tablet Take 1 tablet (500 mg total) by mouth 2 (two) times daily as needed.,  Starting Wed 12/21/2015, Ryan Park, DO 12/22/15 8979    Dorie Rank, MD 12/22/15 [phone]

## 2015-12-21 NOTE — ED Notes (Signed)
The pt returned from c-t c/o abd pain

## 2015-12-21 NOTE — ED Notes (Signed)
Pain med given 

## 2015-12-23 DIAGNOSIS — N2581 Secondary hyperparathyroidism of renal origin: Secondary | ICD-10-CM | POA: Diagnosis not present

## 2015-12-23 DIAGNOSIS — N186 End stage renal disease: Secondary | ICD-10-CM | POA: Diagnosis not present

## 2015-12-23 DIAGNOSIS — Z23 Encounter for immunization: Secondary | ICD-10-CM | POA: Diagnosis not present

## 2015-12-23 DIAGNOSIS — D631 Anemia in chronic kidney disease: Secondary | ICD-10-CM | POA: Diagnosis not present

## 2015-12-26 DIAGNOSIS — N186 End stage renal disease: Secondary | ICD-10-CM | POA: Diagnosis not present

## 2015-12-26 DIAGNOSIS — D631 Anemia in chronic kidney disease: Secondary | ICD-10-CM | POA: Diagnosis not present

## 2015-12-26 DIAGNOSIS — N2581 Secondary hyperparathyroidism of renal origin: Secondary | ICD-10-CM | POA: Diagnosis not present

## 2015-12-26 DIAGNOSIS — Z23 Encounter for immunization: Secondary | ICD-10-CM | POA: Diagnosis not present

## 2015-12-27 ENCOUNTER — Ambulatory Visit (INDEPENDENT_AMBULATORY_CARE_PROVIDER_SITE_OTHER): Payer: Medicare Other | Admitting: Vascular Surgery

## 2015-12-27 ENCOUNTER — Encounter (INDEPENDENT_AMBULATORY_CARE_PROVIDER_SITE_OTHER): Payer: Self-pay | Admitting: Vascular Surgery

## 2015-12-27 VITALS — BP 121/62 | HR 82 | Resp 17 | Ht 61.0 in | Wt 226.0 lb

## 2015-12-27 DIAGNOSIS — M79601 Pain in right arm: Secondary | ICD-10-CM | POA: Diagnosis not present

## 2015-12-27 DIAGNOSIS — Z992 Dependence on renal dialysis: Secondary | ICD-10-CM | POA: Diagnosis not present

## 2015-12-27 DIAGNOSIS — N186 End stage renal disease: Secondary | ICD-10-CM

## 2015-12-27 DIAGNOSIS — N039 Chronic nephritic syndrome with unspecified morphologic changes: Secondary | ICD-10-CM | POA: Diagnosis not present

## 2015-12-27 DIAGNOSIS — M7989 Other specified soft tissue disorders: Secondary | ICD-10-CM | POA: Diagnosis not present

## 2015-12-27 DIAGNOSIS — T829XXD Unspecified complication of cardiac and vascular prosthetic device, implant and graft, subsequent encounter: Secondary | ICD-10-CM

## 2015-12-27 NOTE — Progress Notes (Signed)
Subjective:    Patient ID: [name] Marks, female    DOB: [birth year], 60 y.o.   MRN: [id number] Chief Complaint  Patient presents with  . Re-evaluation    1 week check up for arm infection   Patient presents for a one week wound check. She continues to dialyze through her graft avoiding the areas of skin breakdown. Patient reports improvement in the proximal wound and now draining red tinged fluid from the distal aspect which was "red and swollen" last week. Patient has been taking her oral ABX. Denies any fever, nausea, vomiting or worsening of her wounds.    Review of Systems  Constitutional: Negative.   HENT: Negative.   Eyes: Negative.   Respiratory: Negative.   Cardiovascular: Negative.   Gastrointestinal: Negative.   Endocrine: Negative.   Genitourinary: Negative.   Skin: Positive for wound (Right Upper Extremity).  Allergic/Immunologic: Negative.   Neurological: Negative.   Hematological: Negative.   Psychiatric/Behavioral: Negative.        Objective:   Physical Exam  Constitutional: She is oriented to person, place, and time. She appears well-developed and well-nourished.  HENT:  Head: Normocephalic and atraumatic.  Eyes: Conjunctivae and EOM are normal. Pupils are equal, round, and reactive to light.  Neck: Normal range of motion.  Cardiovascular: Normal rate, regular rhythm, normal heart sounds and intact distal pulses.   Pulses:      Radial pulses are 2+ on the right side, and 2+ on the left side.       Dorsalis pedis pulses are 1+ on the right side, and 1+ on the left side.       Posterior tibial pulses are 1+ on the right side, and 1+ on the left side.  Right Upper Extremity Graft: Good bruit and thrill  Pulmonary/Chest: Effort normal and breath sounds normal.  Abdominal: Soft. Bowel sounds are normal.  Musculoskeletal: Normal range of motion. She exhibits no edema.  Neurological: She is alert and oriented to person, place, and time. She has normal  reflexes.  Skin:  Proximal Wound: Smaller in size then last week. Distal Wound: Swelling has decreased, erythema has resolved. Small hole where abscess opened. Unable to express any fluid.   Psychiatric: She has a normal mood and affect. Her behavior is normal. Judgment and thought content normal.   BP 121/62 (BP Location: Left Arm)   Pulse 82   Resp 17   Ht 5\' 1"  (1.549 m)   Wt 226 lb (102.5 kg)   BMI 42.70 kg/m   Past Medical History:  Diagnosis Date  . Complication of anesthesia    due to kidney disease  . End stage renal disease on dialysis ([place])   . GERD (gastroesophageal reflux disease)   . Gout    Social History   Social History  . Marital status: Single    Spouse name: N/A  . Number of children: N/A  . Years of education: N/A   Occupational History  . Not on file.   Social History Main Topics  . Smoking status: Never Smoker  . Smokeless tobacco: Never Used  . Alcohol use No  . Drug use: No  . Sexual activity: Yes    Birth control/ protection: Post-menopausal   Other Topics Concern  . Not on file   Social History Narrative  . No narrative on file   Past Surgical History:  Procedure Laterality Date  . DG AV DIALYSIS GRAFT DECLOT OR    . DILATATION & CURETTAGE/HYSTEROSCOPY WITH TRUECLEAR  N/A 11/06/2012   Procedure: DILATATION & CURETTAGE/HYSTEROSCOPY WITH TRUECLEAR;  Surgeon: Terrance Mass, MD;  Location: [facility];  Service: Gynecology;  Laterality: N/A;  Truclear Resectoscopic Polypectomy   . PERIPHERAL VASCULAR CATHETERIZATION N/A 09/14/2014   Procedure: A/V Shuntogram/Fistulagram;  Surgeon: Katha Cabal, MD;  Location: [facility];  Service: Cardiovascular;  Laterality: N/A;  . PERIPHERAL VASCULAR CATHETERIZATION N/A 09/14/2014   Procedure: A/V Shunt Intervention;  Surgeon: Katha Cabal, MD;  Location: [facility];  Service: Cardiovascular;  Laterality: N/A;  . PERIPHERAL VASCULAR CATHETERIZATION Right 12/09/2014   Procedure:  A/V Shuntogram/Fistulagram;  Surgeon: Algernon Huxley, MD;  Location: [facility];  Service: Cardiovascular;  Laterality: Right;  . PERIPHERAL VASCULAR CATHETERIZATION N/A 12/09/2014   Procedure: A/V Shunt Intervention;  Surgeon: Algernon Huxley, MD;  Location: [facility];  Service: Cardiovascular;  Laterality: N/A;  . PERIPHERAL VASCULAR CATHETERIZATION Right 05/24/2015   Procedure: A/V Shuntogram;  Surgeon: Serafina Mitchell, MD;  Location: [facility];  Service: Cardiovascular;  Laterality: Right;  . PERIPHERAL VASCULAR CATHETERIZATION Right 05/24/2015   Procedure: Peripheral Vascular Balloon Angioplasty;  Surgeon: Serafina Mitchell, MD;  Location: [place] [facility];  Service: Cardiovascular;  Laterality: Right;  right arm shunt  . PERIPHERAL VASCULAR CATHETERIZATION N/A 06/13/2015   Procedure: A/V Shuntogram/Fistulagram;  Surgeon: Algernon Huxley, MD;  Location: [facility] [facility];  Service: Cardiovascular;  Laterality: N/A;  . PERIPHERAL VASCULAR CATHETERIZATION N/A 06/13/2015   Procedure: A/V Shunt Intervention;  Surgeon: Algernon Huxley, MD;  Location: [facility];  Service: Cardiovascular;  Laterality: N/A;  . TUBAL LIGATION  1983   Family History  Problem Relation Age of Onset  . Diabetes Mother   . Hypertension Mother   . Heart disease Mother   . Alcohol abuse Mother   . Kidney disease Mother   . Cancer Father     COLON  . Alcohol abuse Father   . Breast cancer Sister 61  . Hypertension Sister   . Kidney disease Sister   . Hypertension Son    Allergies  Allergen Reactions  . Contrast Media [Iodinated Diagnostic Agents] Anaphylaxis  . Ancef [Cefazolin Sodium] Nausea And Vomiting      Assessment & Plan:  Patient presents for a one week wound check. She continues to dialyze through her graft avoiding the areas of skin breakdown. Patient reports improvement in the proximal wound and now draining red tinged fluid from the distal aspect which was "red and swollen"  last week. Patient has been taking her oral ABX. Denies any fever, nausea, vomiting or worsening of her wounds.   1. Complication from renal dialysis device, subsequent encounter - Improvement Continue Doxycycline 100mg  PO BID for another week. Continue silvadene to both areas. Dialysis staff to cannulated away from wounds. Follow up in one week for wound check. Patient understands if wounds don't heal and graft is exposed she may need surgical revision.   2. Pain and swelling of right upper extremity - Improved Another week of ABX and silvedene will reassess in one week.   3. RENAL DISEASE, END STAGE - Stable Continue to dialyze through graft avoiding wounds.  Current Outpatient Prescriptions on File Prior to Visit  Medication Sig Dispense Refill  . albuterol (PROVENTIL HFA;VENTOLIN HFA) 108 (90 BASE) MCG/ACT inhaler Inhale 2 puffs into the lungs every 6 (six) hours as needed for wheezing.    Marland Kitchen aspirin EC 81 MG tablet Take 81  mg by mouth daily.    . B Complex-C-Folic Acid (RENAL VITAMIN PO) Take 1 tablet by mouth at bedtime.    . cinacalcet (SENSIPAR) 60 MG tablet Take 60 mg by mouth every evening.     . famotidine (PEPCID) 20 MG tablet Take 20 mg by mouth daily as needed for heartburn or indigestion.    Marland Kitchen ibuprofen (ADVIL,MOTRIN) 200 MG tablet Take 400 mg by mouth every 6 (six) hours as needed for pain.    Marland Kitchen ipratropium (ATROVENT) 0.06 % nasal spray Place 2 sprays into both nostrils 4 (four) times daily. 15 mL 1  . megestrol (MEGACE) 40 MG tablet Take 1 tablet (40 mg total) by mouth 2 (two) times daily. 60 tablet 2  . methocarbamol (ROBAXIN) 500 MG tablet Take 1 tablet (500 mg total) by mouth 2 (two) times daily. 30 tablet 0  . midodrine (PROAMATINE) 10 MG tablet Take 10 mg by mouth 2 (two) times daily.     . Multiple Vitamins-Minerals (PRORENAL VITAL) TABS Take 1 tablet by mouth daily.    . naproxen (NAPROSYN) 500 MG tablet Take 1 tablet (500 mg total) by mouth 2 (two) times daily as  needed. 30 tablet 0  . sevelamer (RENVELA) 800 MG tablet Take 1,600 mg by mouth 3 (three) times daily with meals.      No current facility-administered medications on file prior to visit.    There are no Patient Instructions on file for this visit. Return in about 1 week (around 01/03/2016) for Wound Check.  Chelcy Bolda A Saman Giddens, PA-C

## 2015-12-28 DIAGNOSIS — N2581 Secondary hyperparathyroidism of renal origin: Secondary | ICD-10-CM | POA: Diagnosis not present

## 2015-12-28 DIAGNOSIS — N186 End stage renal disease: Secondary | ICD-10-CM | POA: Diagnosis not present

## 2015-12-28 DIAGNOSIS — D631 Anemia in chronic kidney disease: Secondary | ICD-10-CM | POA: Diagnosis not present

## 2015-12-28 DIAGNOSIS — D509 Iron deficiency anemia, unspecified: Secondary | ICD-10-CM | POA: Diagnosis not present

## 2015-12-30 ENCOUNTER — Other Ambulatory Visit: Payer: Self-pay

## 2015-12-30 DIAGNOSIS — D509 Iron deficiency anemia, unspecified: Secondary | ICD-10-CM | POA: Diagnosis not present

## 2015-12-30 DIAGNOSIS — N2581 Secondary hyperparathyroidism of renal origin: Secondary | ICD-10-CM | POA: Diagnosis not present

## 2015-12-30 DIAGNOSIS — D631 Anemia in chronic kidney disease: Secondary | ICD-10-CM | POA: Diagnosis not present

## 2015-12-30 DIAGNOSIS — N186 End stage renal disease: Secondary | ICD-10-CM | POA: Diagnosis not present

## 2015-12-30 NOTE — Telephone Encounter (Signed)
Refill for 60 tablets

## 2015-12-30 NOTE — Telephone Encounter (Signed)
Pharmacy requesting 90 day supply for this refill. However, your recommendation was she take if for 3 mos and return for biopsy. Biopsy is scheduled for 02/16/16. If you think you might keep her on it past biopsy then you might want to go ahead and refill the 90 day supply for her. (She already has used a 30 day supply Rx.)

## 2016-01-02 DIAGNOSIS — N186 End stage renal disease: Secondary | ICD-10-CM | POA: Diagnosis not present

## 2016-01-02 DIAGNOSIS — D631 Anemia in chronic kidney disease: Secondary | ICD-10-CM | POA: Diagnosis not present

## 2016-01-02 DIAGNOSIS — D509 Iron deficiency anemia, unspecified: Secondary | ICD-10-CM | POA: Diagnosis not present

## 2016-01-02 DIAGNOSIS — N2581 Secondary hyperparathyroidism of renal origin: Secondary | ICD-10-CM | POA: Diagnosis not present

## 2016-01-02 MED ORDER — MEGESTROL ACETATE 40 MG PO TABS: 40.0000 mg | ORAL_TABLET | Freq: Two times a day (BID) | ORAL | 0 refills | Status: DC

## 2016-01-03 ENCOUNTER — Encounter (INDEPENDENT_AMBULATORY_CARE_PROVIDER_SITE_OTHER): Payer: Self-pay | Admitting: Vascular Surgery

## 2016-01-03 ENCOUNTER — Ambulatory Visit (INDEPENDENT_AMBULATORY_CARE_PROVIDER_SITE_OTHER): Payer: Medicare Other | Admitting: Vascular Surgery

## 2016-01-03 VITALS — BP 135/65 | HR 88 | Resp 16 | Ht 61.0 in | Wt 225.0 lb

## 2016-01-03 DIAGNOSIS — M7989 Other specified soft tissue disorders: Secondary | ICD-10-CM

## 2016-01-03 DIAGNOSIS — T829XXD Unspecified complication of cardiac and vascular prosthetic device, implant and graft, subsequent encounter: Secondary | ICD-10-CM

## 2016-01-03 DIAGNOSIS — M79601 Pain in right arm: Secondary | ICD-10-CM | POA: Diagnosis not present

## 2016-01-03 DIAGNOSIS — N186 End stage renal disease: Secondary | ICD-10-CM

## 2016-01-03 NOTE — Progress Notes (Signed)
Subjective:    Patient ID: [name] Marks, female    DOB: 29-Apr-1955, 60 y.o.   MRN: [id number] Chief Complaint  Patient presents with  . Routine Post Op    1 week wound check   Patient presents for a one week wound follow up. She continues to take Doxycycline and apply Silvadene to the two areas on her right upper extremity. States no fever, nausea or vomiting. Patient states wounds are "getting better". No issues with dialysis from graft.    Review of Systems Constitutional: Negative.   HENT: Negative.   Eyes: Negative.   Respiratory: Negative.   Cardiovascular: Negative.   Gastrointestinal: Negative.   Endocrine: Negative.   Genitourinary: Negative.   Skin: Positive for wound (Right Upper Extremity).  Allergic/Immunologic: Negative.   Neurological: Negative.   Hematological: Negative.   Psychiatric/Behavioral: Negative.    Objective:   Physical Exam Constitutional: She is oriented to person, place, and time. She appears well-developed and well-nourished.  HENT:  Head: Normocephalic and atraumatic.  Eyes: Conjunctivae and EOM are normal. Pupils are equal, round, and reactive to light.  Neck: Normal range of motion.  Cardiovascular: Normal rate, regular rhythm, normal heart sounds and intact distal pulses.   Pulses:      Radial pulses are 2+ on the right side, and 2+ on the left side.       Dorsalis pedis pulses are 1+ on the right side, and 1+ on the left side.       Posterior tibial pulses are 1+ on the right side, and 1+ on the left side.  Right Upper Extremity Graft: Good bruit and thrill  Pulmonary/Chest: Effort normal and breath sounds normal.  Abdominal: Soft. Bowel sounds are normal.  Musculoskeletal: Normal range of motion. She exhibits no edema.  Neurological: She is alert and oriented to person, place, and time. She has normal reflexes.  Skin:  Proximal Wound: Smaller in size then last week, new granulation tissue present in wound  Distal Wound: Swelling  continues to decrease, erythema has resolved. Small hole where abscess opened. Able to express some bloody purulent material.   Psychiatric: She has a normal mood and affect. Her behavior is normal. Judgment and thought content normal.   BP 135/65 (BP Location: Left Arm)   Pulse 88   Resp 16   Ht 5\' 1"  (1.549 m)   Wt 225 lb (102.1 kg)   BMI 42.51 kg/m   Past Medical History:  Diagnosis Date  . Complication of anesthesia    due to kidney disease  . End stage renal disease on dialysis ([place])   . GERD (gastroesophageal reflux disease)   . Gout    Social History   Social History  . Marital status: Single    Spouse name: N/A  . Number of children: N/A  . Years of education: N/A   Occupational History  . Not on file.   Social History Main Topics  . Smoking status: Never Smoker  . Smokeless tobacco: Never Used  . Alcohol use No  . Drug use: No  . Sexual activity: Yes    Birth control/ protection: Post-menopausal   Other Topics Concern  . Not on file   Social History Narrative  . No narrative on file   Past Surgical History:  Procedure Laterality Date  . DG AV DIALYSIS GRAFT DECLOT OR    . DILATATION & CURETTAGE/HYSTEROSCOPY WITH TRUECLEAR N/A 11/06/2012   Procedure: DILATATION & CURETTAGE/HYSTEROSCOPY WITH TRUECLEAR;  Surgeon: Terrance Mass,  MD;  Location: [facility];  Service: Gynecology;  Laterality: N/A;  Truclear Resectoscopic Polypectomy   . PERIPHERAL VASCULAR CATHETERIZATION N/A 09/14/2014   Procedure: A/V Shuntogram/Fistulagram;  Surgeon: Katha Cabal, MD;  Location: [facility];  Service: Cardiovascular;  Laterality: N/A;  . PERIPHERAL VASCULAR CATHETERIZATION N/A 09/14/2014   Procedure: A/V Shunt Intervention;  Surgeon: Katha Cabal, MD;  Location: [facility];  Service: Cardiovascular;  Laterality: N/A;  . PERIPHERAL VASCULAR CATHETERIZATION Right 12/09/2014   Procedure: A/V Shuntogram/Fistulagram;  Surgeon: Algernon Huxley, MD;  Location:  [place] [facility];  Service: Cardiovascular;  Laterality: Right;  . PERIPHERAL VASCULAR CATHETERIZATION N/A 12/09/2014   Procedure: A/V Shunt Intervention;  Surgeon: Algernon Huxley, MD;  Location: [facility];  Service: Cardiovascular;  Laterality: N/A;  . PERIPHERAL VASCULAR CATHETERIZATION Right 05/24/2015   Procedure: A/V Shuntogram;  Surgeon: Serafina Mitchell, MD;  Location: [facility];  Service: Cardiovascular;  Laterality: Right;  . PERIPHERAL VASCULAR CATHETERIZATION Right 05/24/2015   Procedure: Peripheral Vascular Balloon Angioplasty;  Surgeon: Serafina Mitchell, MD;  Location: [facility];  Service: Cardiovascular;  Laterality: Right;  right arm shunt  . PERIPHERAL VASCULAR CATHETERIZATION N/A 06/13/2015   Procedure: A/V Shuntogram/Fistulagram;  Surgeon: Algernon Huxley, MD;  Location: [facility];  Service: Cardiovascular;  Laterality: N/A;  . PERIPHERAL VASCULAR CATHETERIZATION N/A 06/13/2015   Procedure: A/V Shunt Intervention;  Surgeon: Algernon Huxley, MD;  Location: [facility];  Service: Cardiovascular;  Laterality: N/A;  . TUBAL LIGATION  1983   Family History  Problem Relation Age of Onset  . Diabetes Mother   . Hypertension Mother   . Heart disease Mother   . Alcohol abuse Mother   . Kidney disease Mother   . Cancer Father     COLON  . Alcohol abuse Father   . Breast cancer Sister 58  . Hypertension Sister   . Kidney disease Sister   . Hypertension Son    Allergies  Allergen Reactions  . Contrast Media [Iodinated Diagnostic Agents] Anaphylaxis  . Ancef [Cefazolin Sodium] Nausea And Vomiting  . Naproxen Sodium Itching      Assessment & Plan:  Patient presents for a one week wound follow up. She continues to take Doxycycline and apply Silvadene to the two areas on her right upper extremity. States no fever, nausea or vomiting. Patient states wounds are "getting better". No issues with dialysis from graft.   1. Complication from renal  dialysis device, subsequent encounter - Improvement Continue Doxycycline 100mg  PO BID for another 10 days. Continue silvadene to both areas. Dialysis staff to cannulated away from wounds. Follow up in 10 days for wound check. Patient understands if wounds don't heal and graft is exposed she may need surgical revision. Patient to call office if she experiences any fever, nausea or vomiting.   2. Pain and swelling of right upper extremity - Improved Wounds continue to heal. Another 10 days of ABX and silvedene will reassess in one week.  3. RENAL DISEASE, END STAGE - Stable Continue to dialyze through graft avoiding wounds.  Current Outpatient Prescriptions on File Prior to Visit  Medication Sig Dispense Refill  . albuterol (PROVENTIL HFA;VENTOLIN HFA) 108 (90 BASE) MCG/ACT inhaler Inhale 2 puffs into the lungs every 6 (six) hours as needed for wheezing.    Marland Kitchen aspirin EC 81 MG tablet Take 81 mg by mouth daily.    . B Complex-C-Folic Acid (  RENAL VITAMIN PO) Take 1 tablet by mouth at bedtime.    . cinacalcet (SENSIPAR) 60 MG tablet Take 60 mg by mouth every evening.     . famotidine (PEPCID) 20 MG tablet Take 20 mg by mouth daily as needed for heartburn or indigestion.    Marland Kitchen ibuprofen (ADVIL,MOTRIN) 200 MG tablet Take 400 mg by mouth every 6 (six) hours as needed for pain.    Marland Kitchen ipratropium (ATROVENT) 0.06 % nasal spray Place 2 sprays into both nostrils 4 (four) times daily. 15 mL 1  . megestrol (MEGACE) 40 MG tablet Take 1 tablet (40 mg total) by mouth 2 (two) times daily. 60 tablet 0  . methocarbamol (ROBAXIN) 500 MG tablet Take 1 tablet (500 mg total) by mouth 2 (two) times daily. 30 tablet 0  . midodrine (PROAMATINE) 10 MG tablet Take 10 mg by mouth 2 (two) times daily.     . Multiple Vitamins-Minerals (PRORENAL VITAL) TABS Take 1 tablet by mouth daily.    . sevelamer (RENVELA) 800 MG tablet Take 1,600 mg by mouth 3 (three) times daily with meals.      No current facility-administered  medications on file prior to visit.     There are no Patient Instructions on file for this visit. No Follow-up on file.   Mirjana Tarleton A Laxmi Choung, PA-C

## 2016-01-04 DIAGNOSIS — D509 Iron deficiency anemia, unspecified: Secondary | ICD-10-CM | POA: Diagnosis not present

## 2016-01-04 DIAGNOSIS — N186 End stage renal disease: Secondary | ICD-10-CM | POA: Diagnosis not present

## 2016-01-04 DIAGNOSIS — D631 Anemia in chronic kidney disease: Secondary | ICD-10-CM | POA: Diagnosis not present

## 2016-01-04 DIAGNOSIS — N2581 Secondary hyperparathyroidism of renal origin: Secondary | ICD-10-CM | POA: Diagnosis not present

## 2016-01-06 DIAGNOSIS — D631 Anemia in chronic kidney disease: Secondary | ICD-10-CM | POA: Diagnosis not present

## 2016-01-06 DIAGNOSIS — D509 Iron deficiency anemia, unspecified: Secondary | ICD-10-CM | POA: Diagnosis not present

## 2016-01-06 DIAGNOSIS — N186 End stage renal disease: Secondary | ICD-10-CM | POA: Diagnosis not present

## 2016-01-06 DIAGNOSIS — N2581 Secondary hyperparathyroidism of renal origin: Secondary | ICD-10-CM | POA: Diagnosis not present

## 2016-01-09 DIAGNOSIS — N186 End stage renal disease: Secondary | ICD-10-CM | POA: Diagnosis not present

## 2016-01-09 DIAGNOSIS — D631 Anemia in chronic kidney disease: Secondary | ICD-10-CM | POA: Diagnosis not present

## 2016-01-09 DIAGNOSIS — D509 Iron deficiency anemia, unspecified: Secondary | ICD-10-CM | POA: Diagnosis not present

## 2016-01-09 DIAGNOSIS — N2581 Secondary hyperparathyroidism of renal origin: Secondary | ICD-10-CM | POA: Diagnosis not present

## 2016-01-11 DIAGNOSIS — N2581 Secondary hyperparathyroidism of renal origin: Secondary | ICD-10-CM | POA: Diagnosis not present

## 2016-01-11 DIAGNOSIS — N186 End stage renal disease: Secondary | ICD-10-CM | POA: Diagnosis not present

## 2016-01-11 DIAGNOSIS — D631 Anemia in chronic kidney disease: Secondary | ICD-10-CM | POA: Diagnosis not present

## 2016-01-11 DIAGNOSIS — D509 Iron deficiency anemia, unspecified: Secondary | ICD-10-CM | POA: Diagnosis not present

## 2016-01-12 ENCOUNTER — Ambulatory Visit (INDEPENDENT_AMBULATORY_CARE_PROVIDER_SITE_OTHER): Payer: Medicare Other | Admitting: Vascular Surgery

## 2016-01-12 ENCOUNTER — Encounter (INDEPENDENT_AMBULATORY_CARE_PROVIDER_SITE_OTHER): Payer: Self-pay | Admitting: Vascular Surgery

## 2016-01-12 VITALS — BP 149/75 | HR 97 | Resp 16 | Ht 61.0 in | Wt 222.0 lb

## 2016-01-12 DIAGNOSIS — T829XXS Unspecified complication of cardiac and vascular prosthetic device, implant and graft, sequela: Secondary | ICD-10-CM

## 2016-01-12 DIAGNOSIS — N186 End stage renal disease: Secondary | ICD-10-CM | POA: Diagnosis not present

## 2016-01-12 DIAGNOSIS — M7989 Other specified soft tissue disorders: Secondary | ICD-10-CM | POA: Diagnosis not present

## 2016-01-12 DIAGNOSIS — M79601 Pain in right arm: Secondary | ICD-10-CM

## 2016-01-13 DIAGNOSIS — N2581 Secondary hyperparathyroidism of renal origin: Secondary | ICD-10-CM | POA: Diagnosis not present

## 2016-01-13 DIAGNOSIS — N186 End stage renal disease: Secondary | ICD-10-CM | POA: Diagnosis not present

## 2016-01-13 DIAGNOSIS — D509 Iron deficiency anemia, unspecified: Secondary | ICD-10-CM | POA: Diagnosis not present

## 2016-01-13 DIAGNOSIS — D631 Anemia in chronic kidney disease: Secondary | ICD-10-CM | POA: Diagnosis not present

## 2016-01-15 DIAGNOSIS — D509 Iron deficiency anemia, unspecified: Secondary | ICD-10-CM | POA: Diagnosis not present

## 2016-01-15 DIAGNOSIS — D631 Anemia in chronic kidney disease: Secondary | ICD-10-CM | POA: Diagnosis not present

## 2016-01-15 DIAGNOSIS — N186 End stage renal disease: Secondary | ICD-10-CM | POA: Diagnosis not present

## 2016-01-15 DIAGNOSIS — N2581 Secondary hyperparathyroidism of renal origin: Secondary | ICD-10-CM | POA: Diagnosis not present

## 2016-01-16 NOTE — Progress Notes (Signed)
Subjective:    Patient ID: [name] Marks, female    DOB: April 09, 1955, 60 y.o.   MRN: [id number] Chief Complaint  Patient presents with  . Wound Check   Patient presents for a 10 day wound check. She continues to take PO Doxycycline and use Silvadene. Patient reports improvement in her two wounds. Denies any fever, nausea or vomiting. Graft is functioning without complication.    Review of Systems Review of Systems Constitutional: Negative.  HENT: Negative.  Eyes: Negative.  Respiratory: Negative.  Cardiovascular: Negative.  Gastrointestinal: Negative.  Endocrine: Negative.  Genitourinary: Negative.  Skin: Positive for wound(Right Upper Extremity).  Allergic/Immunologic: Negative.  Neurological: Negative.  Hematological: Negative.  Psychiatric/Behavioral: Negative.    Objective:   Physical Exam Constitutional: She is oriented to person, place, and time. She appears well-developedand well-nourished.  HENT:  Head: Normocephalicand atraumatic.  Eyes: Conjunctivaeand EOMare normal. Pupils are equal, round, and reactive to light.  Neck: Normal range of motion.  Cardiovascular: Normal rate, regular rhythm, normal heart soundsand intact distal pulses.  Pulses: Radial pulses are 2+on the right side, and 2+on the left side.  Dorsalis pedis pulses are 1+on the right side, and 1+on the left side.  Posterior tibial pulses are 1+on the right side, and 1+on the left side.  Right Upper Extremity Graft: Good bruit and thrill Pulmonary/Chest: Effort normaland breath sounds normal.  Abdominal: Soft. Bowel sounds are normal.  Musculoskeletal: Normal range of motion. She exhibits no edema.  Neurological: She is alertand oriented to person, place, and time. She has normal reflexes.  Skin:  Proximal Wound: Smaller in size then last week, new granulation tissue present in wound. Almost closed.  Distal Wound: Swelling continues to decrease, erythema  has resolved. Small hole where abscess opened. Able to express some bloody material.  Psychiatric: She has a normal mood and affect. Her behavior is normal. Judgmentand thought contentnormal.   BP (!) 149/75   Pulse 97   Resp 16   Ht 5\' 1"  (1.549 m)   Wt 222 lb (100.7 kg)   BMI 41.95 kg/m   Past Medical History:  Diagnosis Date  . Complication of anesthesia    due to kidney disease  . End stage renal disease on dialysis ([place])   . GERD (gastroesophageal reflux disease)   . Gout    Social History   Social History  . Marital status: Single    Spouse name: N/A  . Number of children: N/A  . Years of education: N/A   Occupational History  . Not on file.   Social History Main Topics  . Smoking status: Never Smoker  . Smokeless tobacco: Never Used  . Alcohol use No  . Drug use: No  . Sexual activity: Yes    Birth control/ protection: Post-menopausal   Other Topics Concern  . Not on file   Social History Narrative  . No narrative on file   Past Surgical History:  Procedure Laterality Date  . DG AV DIALYSIS GRAFT DECLOT OR    . DILATATION & CURETTAGE/HYSTEROSCOPY WITH TRUECLEAR N/A 11/06/2012   Procedure: DILATATION & CURETTAGE/HYSTEROSCOPY WITH TRUECLEAR;  Surgeon: Terrance Mass, MD;  Location: [facility];  Service: Gynecology;  Laterality: N/A;  Truclear Resectoscopic Polypectomy   . PERIPHERAL VASCULAR CATHETERIZATION N/A 09/14/2014   Procedure: A/V Shuntogram/Fistulagram;  Surgeon: Katha Cabal, MD;  Location: [facility];  Service: Cardiovascular;  Laterality: N/A;  . PERIPHERAL VASCULAR CATHETERIZATION N/A 09/14/2014   Procedure: A/V Shunt Intervention;  Surgeon:  Katha Cabal, MD;  Location: [facility];  Service: Cardiovascular;  Laterality: N/A;  . PERIPHERAL VASCULAR CATHETERIZATION Right 12/09/2014   Procedure: A/V Shuntogram/Fistulagram;  Surgeon: Algernon Huxley, MD;  Location: [facility];  Service: Cardiovascular;  Laterality:  Right;  . PERIPHERAL VASCULAR CATHETERIZATION N/A 12/09/2014   Procedure: A/V Shunt Intervention;  Surgeon: Algernon Huxley, MD;  Location: [facility];  Service: Cardiovascular;  Laterality: N/A;  . PERIPHERAL VASCULAR CATHETERIZATION Right 05/24/2015   Procedure: A/V Shuntogram;  Surgeon: Serafina Mitchell, MD;  Location: [facility];  Service: Cardiovascular;  Laterality: Right;  . PERIPHERAL VASCULAR CATHETERIZATION Right 05/24/2015   Procedure: Peripheral Vascular Balloon Angioplasty;  Surgeon: Serafina Mitchell, MD;  Location: [facility];  Service: Cardiovascular;  Laterality: Right;  right arm shunt  . PERIPHERAL VASCULAR CATHETERIZATION N/A 06/13/2015   Procedure: A/V Shuntogram/Fistulagram;  Surgeon: Algernon Huxley, MD;  Location: [facility];  Service: Cardiovascular;  Laterality: N/A;  . PERIPHERAL VASCULAR CATHETERIZATION N/A 06/13/2015   Procedure: A/V Shunt Intervention;  Surgeon: Algernon Huxley, MD;  Location: [facility];  Service: Cardiovascular;  Laterality: N/A;  . TUBAL LIGATION  1983   Family History  Problem Relation Age of Onset  . Diabetes Mother   . Hypertension Mother   . Heart disease Mother   . Alcohol abuse Mother   . Kidney disease Mother   . Cancer Father     COLON  . Alcohol abuse Father   . Breast cancer Sister 58  . Hypertension Sister   . Kidney disease Sister   . Hypertension Son    Allergies  Allergen Reactions  . Contrast Media [Iodinated Diagnostic Agents] Anaphylaxis  . Ancef [Cefazolin Sodium] Nausea And Vomiting  . Naproxen Sodium Itching      Assessment & Plan:  Patient presents for a 10 day wound check. She continues to take PO Doxycycline and use Silvadene. Patient reports improvement in her two wounds. Denies any fever, nausea or vomiting. Graft is functioning without complication.   1. Complication from renal dialysis device, sequela Improvement Continue Doxycycline 100mg  PO BID for another 10 days. Continue  silvadene to both areas. Dialysis staff to cannulated away from wounds. Follow up in 10 days for wound check. Patient understands if wounds don't heal and graft is exposed she may need surgical revision. Patient to call office if she experiences any fever, nausea or vomiting  2. RENAL DISEASE, END STAGE - Stable Continue to dialyze through graft avoiding wounds.  3. Pain and swelling of right upper extremity _ Improvement Wounds continue to heal. Another 10 days of ABX and silvedene will reassess..  Current Outpatient Prescriptions on File Prior to Visit  Medication Sig Dispense Refill  . albuterol (PROVENTIL HFA;VENTOLIN HFA) 108 (90 BASE) MCG/ACT inhaler Inhale 2 puffs into the lungs every 6 (six) hours as needed for wheezing.    Marland Kitchen aspirin EC 81 MG tablet Take 81 mg by mouth daily.    . B Complex-C-Folic Acid (RENAL VITAMIN PO) Take 1 tablet by mouth at bedtime.    . cinacalcet (SENSIPAR) 60 MG tablet Take 60 mg by mouth every evening.     Marland Kitchen doxycycline (VIBRA-TABS) 100 MG tablet     . famotidine (PEPCID) 20 MG tablet Take 20 mg by mouth daily as needed for heartburn or indigestion.    Marland Kitchen ibuprofen (ADVIL,MOTRIN) 200 MG tablet Take 400 mg by mouth every 6 (six) hours  as needed for pain.    Marland Kitchen ipratropium (ATROVENT) 0.06 % nasal spray Place 2 sprays into both nostrils 4 (four) times daily. 15 mL 1  . megestrol (MEGACE) 40 MG tablet Take 1 tablet (40 mg total) by mouth 2 (two) times daily. 60 tablet 0  . methocarbamol (ROBAXIN) 500 MG tablet Take 1 tablet (500 mg total) by mouth 2 (two) times daily. 30 tablet 0  . midodrine (PROAMATINE) 10 MG tablet Take 10 mg by mouth 2 (two) times daily.     . Multiple Vitamins-Minerals (PRORENAL VITAL) TABS Take 1 tablet by mouth daily.    . sevelamer (RENVELA) 800 MG tablet Take 1,600 mg by mouth 3 (three) times daily with meals.      No current facility-administered medications on file prior to visit.     There are no Patient Instructions on file for  this visit. No Follow-up on file.   Couper Juncaj A Kinga Cassar, PA-C

## 2016-01-17 DIAGNOSIS — D509 Iron deficiency anemia, unspecified: Secondary | ICD-10-CM | POA: Diagnosis not present

## 2016-01-17 DIAGNOSIS — N2581 Secondary hyperparathyroidism of renal origin: Secondary | ICD-10-CM | POA: Diagnosis not present

## 2016-01-17 DIAGNOSIS — N186 End stage renal disease: Secondary | ICD-10-CM | POA: Diagnosis not present

## 2016-01-17 DIAGNOSIS — D631 Anemia in chronic kidney disease: Secondary | ICD-10-CM | POA: Diagnosis not present

## 2016-01-20 DIAGNOSIS — D631 Anemia in chronic kidney disease: Secondary | ICD-10-CM | POA: Diagnosis not present

## 2016-01-20 DIAGNOSIS — N186 End stage renal disease: Secondary | ICD-10-CM | POA: Diagnosis not present

## 2016-01-20 DIAGNOSIS — N2581 Secondary hyperparathyroidism of renal origin: Secondary | ICD-10-CM | POA: Diagnosis not present

## 2016-01-20 DIAGNOSIS — D509 Iron deficiency anemia, unspecified: Secondary | ICD-10-CM | POA: Diagnosis not present

## 2016-01-23 ENCOUNTER — Other Ambulatory Visit (INDEPENDENT_AMBULATORY_CARE_PROVIDER_SITE_OTHER): Payer: Self-pay | Admitting: Vascular Surgery

## 2016-01-23 DIAGNOSIS — N186 End stage renal disease: Secondary | ICD-10-CM | POA: Diagnosis not present

## 2016-01-23 DIAGNOSIS — D509 Iron deficiency anemia, unspecified: Secondary | ICD-10-CM | POA: Diagnosis not present

## 2016-01-23 DIAGNOSIS — D631 Anemia in chronic kidney disease: Secondary | ICD-10-CM | POA: Diagnosis not present

## 2016-01-23 DIAGNOSIS — N2581 Secondary hyperparathyroidism of renal origin: Secondary | ICD-10-CM | POA: Diagnosis not present

## 2016-01-24 ENCOUNTER — Encounter (INDEPENDENT_AMBULATORY_CARE_PROVIDER_SITE_OTHER): Payer: Self-pay | Admitting: Vascular Surgery

## 2016-01-24 ENCOUNTER — Ambulatory Visit (INDEPENDENT_AMBULATORY_CARE_PROVIDER_SITE_OTHER): Payer: Medicare Other | Admitting: Vascular Surgery

## 2016-01-24 VITALS — BP 150/71 | HR 93 | Resp 16 | Ht 61.0 in | Wt 221.0 lb

## 2016-01-24 DIAGNOSIS — M79601 Pain in right arm: Secondary | ICD-10-CM

## 2016-01-24 DIAGNOSIS — M7989 Other specified soft tissue disorders: Secondary | ICD-10-CM | POA: Diagnosis not present

## 2016-01-24 DIAGNOSIS — T829XXS Unspecified complication of cardiac and vascular prosthetic device, implant and graft, sequela: Secondary | ICD-10-CM | POA: Diagnosis not present

## 2016-01-24 NOTE — Progress Notes (Signed)
Subjective:    Patient ID: [name] Marks, female    DOB: Jan 08, 1956, 60 y.o.   MRN: [id number] Chief Complaint  Patient presents with  . Follow-up    Patient presents for a weekly wound check. Patient states improvement in both areas of her right upper extremity access wounds. She continues to use the silvadene on the proximal wound and taking PO doxycycline. Denies any fever, nausea or vomiting. No drainage from either wound. Graft is working without issue.    Review of Systems Constitutional: Negative.  HENT: Negative.  Eyes: Negative.  Respiratory: Negative.  Cardiovascular: Negative.  Gastrointestinal: Negative.  Endocrine: Negative.  Genitourinary: Negative.  Skin: Positive for wound(Right Upper Extremity).  Allergic/Immunologic: Negative.  Neurological: Negative.  Hematological: Negative.  Psychiatric/Behavioral: Negative.    Objective:   Physical Exam Constitutional: She is oriented to person, place, and time. She appears well-developedand well-nourished.  HENT:  Head: Normocephalicand atraumatic.  Eyes: Conjunctivaeand EOMare normal. Pupils are equal, round, and reactive to light.  Neck: Normal range of motion.  Cardiovascular: Normal rate, regular rhythm, normal heart soundsand intact distal pulses.  Pulses: Radial pulses are 2+on the right side, and 2+on the left side.  Dorsalis pedis pulses are 1+on the right side, and 1+on the left side.  Posterior tibial pulses are 1+on the right side, and 1+on the left side.  Right Upper Extremity Graft: Good bruit and thrill Pulmonary/Chest: Effort normaland breath sounds normal.  Abdominal: Soft. Bowel sounds are normal.  Musculoskeletal: Normal range of motion. She exhibits no edema.  Neurological: She is alertand oriented to person, place, and time. She has normal reflexes.  Skin:  Proximal Wound: Smaller in size then last week,  granulation tissue present in wound - continues  to grow. Almost closed. No erythema or drainage.   Distal Wound: Flat. Can palpate stent. Erythema has resolved. No drainage.  Psychiatric: She has a normal mood and affect. Her behavior is normal. Judgmentand thought contentnormal.   BP (!) 150/71   Pulse 93   Resp 16   Ht 5\' 1"  (1.549 m)   Wt 221 lb (100.2 kg)   BMI 41.76 kg/m   Past Medical History:  Diagnosis Date  . Complication of anesthesia    due to kidney disease  . End stage renal disease on dialysis ([place])   . GERD (gastroesophageal reflux disease)   . Gout    Social History   Social History  . Marital status: Single    Spouse name: N/A  . Number of children: N/A  . Years of education: N/A   Occupational History  . Not on file.   Social History Main Topics  . Smoking status: Never Smoker  . Smokeless tobacco: Never Used  . Alcohol use No  . Drug use: No  . Sexual activity: Yes    Birth control/ protection: Post-menopausal   Other Topics Concern  . Not on file   Social History Narrative  . No narrative on file   Past Surgical History:  Procedure Laterality Date  . DG AV DIALYSIS GRAFT DECLOT OR    . DILATATION & CURETTAGE/HYSTEROSCOPY WITH TRUECLEAR N/A 11/06/2012   Procedure: DILATATION & CURETTAGE/HYSTEROSCOPY WITH TRUECLEAR;  Surgeon: Terrance Mass, MD;  Location: [facility];  Service: Gynecology;  Laterality: N/A;  Truclear Resectoscopic Polypectomy   . PERIPHERAL VASCULAR CATHETERIZATION N/A 09/14/2014   Procedure: A/V Shuntogram/Fistulagram;  Surgeon: Katha Cabal, MD;  Location: [facility];  Service: Cardiovascular;  Laterality: N/A;  . PERIPHERAL  VASCULAR CATHETERIZATION N/A 09/14/2014   Procedure: A/V Shunt Intervention;  Surgeon: Katha Cabal, MD;  Location: [facility];  Service: Cardiovascular;  Laterality: N/A;  . PERIPHERAL VASCULAR CATHETERIZATION Right 12/09/2014   Procedure: A/V Shuntogram/Fistulagram;  Surgeon: Algernon Huxley, MD;  Location: [facility];  Service: Cardiovascular;  Laterality: Right;  . PERIPHERAL VASCULAR CATHETERIZATION N/A 12/09/2014   Procedure: A/V Shunt Intervention;  Surgeon: Algernon Huxley, MD;  Location: [facility];  Service: Cardiovascular;  Laterality: N/A;  . PERIPHERAL VASCULAR CATHETERIZATION Right 05/24/2015   Procedure: A/V Shuntogram;  Surgeon: Serafina Mitchell, MD;  Location: [facility];  Service: Cardiovascular;  Laterality: Right;  . PERIPHERAL VASCULAR CATHETERIZATION Right 05/24/2015   Procedure: Peripheral Vascular Balloon Angioplasty;  Surgeon: Serafina Mitchell, MD;  Location: [facility];  Service: Cardiovascular;  Laterality: Right;  right arm shunt  . PERIPHERAL VASCULAR CATHETERIZATION N/A 06/13/2015   Procedure: A/V Shuntogram/Fistulagram;  Surgeon: Algernon Huxley, MD;  Location: [facility];  Service: Cardiovascular;  Laterality: N/A;  . PERIPHERAL VASCULAR CATHETERIZATION N/A 06/13/2015   Procedure: A/V Shunt Intervention;  Surgeon: Algernon Huxley, MD;  Location: [facility];  Service: Cardiovascular;  Laterality: N/A;  . TUBAL LIGATION  1983   Family History  Problem Relation Age of Onset  . Diabetes Mother   . Hypertension Mother   . Heart disease Mother   . Alcohol abuse Mother   . Kidney disease Mother   . Cancer Father     COLON  . Alcohol abuse Father   . Breast cancer Sister 86  . Hypertension Sister   . Kidney disease Sister   . Hypertension Son    Allergies  Allergen Reactions  . Contrast Media [Iodinated Diagnostic Agents] Anaphylaxis  . Ancef [Cefazolin Sodium] Nausea And Vomiting  . Naproxen Sodium Itching      Assessment & Plan:  Patient presents for a weekly wound check. Patient states improvement in both areas of her right upper extremity access wounds. She continues to use the silvadene on the proximal wound and taking PO doxycycline. Denies any fever, nausea or vomiting. No drainage from either wound. Graft is working without issue.    1. Pain and swelling of right upper extremity Infection seems to have resolved. Stop doxycycline. Continue silvadene on proximal wound. Patient understands if wounds don't heal and graft is exposed she may need surgical revision. Patient to call office if she experiences any fever, nausea or vomiting or worsening / returning infection. Follow up one month for wound check.   2. Complication from renal dialysis device, sequela Continue dialysis avoiding these two areas. Patient to follow up in one month or sooner if infection returns for wound check.   Current Outpatient Prescriptions on File Prior to Visit  Medication Sig Dispense Refill  . albuterol (PROVENTIL HFA;VENTOLIN HFA) 108 (90 BASE) MCG/ACT inhaler Inhale 2 puffs into the lungs every 6 (six) hours as needed for wheezing.    Marland Kitchen aspirin EC 81 MG tablet Take 81 mg by mouth daily.    . B Complex-C-Folic Acid (RENAL VITAMIN PO) Take 1 tablet by mouth at bedtime.    . cinacalcet (SENSIPAR) 60 MG tablet Take 60 mg by mouth every evening.     Marland Kitchen doxycycline (VIBRA-TABS) 100 MG tablet TAKE 1 TABLET BY MOUTH TWICE A DAY 30 tablet 0  . famotidine (PEPCID) 20 MG tablet Take 20 mg by mouth daily as needed  for heartburn or indigestion.    Marland Kitchen ibuprofen (ADVIL,MOTRIN) 200 MG tablet Take 400 mg by mouth every 6 (six) hours as needed for pain.    Marland Kitchen ipratropium (ATROVENT) 0.06 % nasal spray Place 2 sprays into both nostrils 4 (four) times daily. 15 mL 1  . megestrol (MEGACE) 40 MG tablet Take 1 tablet (40 mg total) by mouth 2 (two) times daily. 60 tablet 0  . methocarbamol (ROBAXIN) 500 MG tablet Take 1 tablet (500 mg total) by mouth 2 (two) times daily. 30 tablet 0  . midodrine (PROAMATINE) 10 MG tablet Take 10 mg by mouth 2 (two) times daily.     . Multiple Vitamins-Minerals (PRORENAL VITAL) TABS Take 1 tablet by mouth daily.    . sevelamer (RENVELA) 800 MG tablet Take 1,600 mg by mouth 3 (three) times daily with meals.      No current  facility-administered medications on file prior to visit.    There are no Patient Instructions on file for this visit. Return in about 1 month (around 02/23/2016) for Wound Check.  Atziri Zubiate A Anadelia Kintz, PA-C

## 2016-01-25 DIAGNOSIS — D631 Anemia in chronic kidney disease: Secondary | ICD-10-CM | POA: Diagnosis not present

## 2016-01-25 DIAGNOSIS — N186 End stage renal disease: Secondary | ICD-10-CM | POA: Diagnosis not present

## 2016-01-25 DIAGNOSIS — N2581 Secondary hyperparathyroidism of renal origin: Secondary | ICD-10-CM | POA: Diagnosis not present

## 2016-01-25 DIAGNOSIS — D509 Iron deficiency anemia, unspecified: Secondary | ICD-10-CM | POA: Diagnosis not present

## 2016-01-26 DIAGNOSIS — Z992 Dependence on renal dialysis: Secondary | ICD-10-CM | POA: Diagnosis not present

## 2016-01-26 DIAGNOSIS — N039 Chronic nephritic syndrome with unspecified morphologic changes: Secondary | ICD-10-CM | POA: Diagnosis not present

## 2016-01-26 DIAGNOSIS — N186 End stage renal disease: Secondary | ICD-10-CM | POA: Diagnosis not present

## 2016-01-27 DIAGNOSIS — D631 Anemia in chronic kidney disease: Secondary | ICD-10-CM | POA: Diagnosis not present

## 2016-01-27 DIAGNOSIS — N186 End stage renal disease: Secondary | ICD-10-CM | POA: Diagnosis not present

## 2016-01-27 DIAGNOSIS — N2581 Secondary hyperparathyroidism of renal origin: Secondary | ICD-10-CM | POA: Diagnosis not present

## 2016-01-30 DIAGNOSIS — N2581 Secondary hyperparathyroidism of renal origin: Secondary | ICD-10-CM | POA: Diagnosis not present

## 2016-01-30 DIAGNOSIS — N186 End stage renal disease: Secondary | ICD-10-CM | POA: Diagnosis not present

## 2016-01-30 DIAGNOSIS — D631 Anemia in chronic kidney disease: Secondary | ICD-10-CM | POA: Diagnosis not present

## 2016-02-01 DIAGNOSIS — N2581 Secondary hyperparathyroidism of renal origin: Secondary | ICD-10-CM | POA: Diagnosis not present

## 2016-02-01 DIAGNOSIS — D631 Anemia in chronic kidney disease: Secondary | ICD-10-CM | POA: Diagnosis not present

## 2016-02-01 DIAGNOSIS — N186 End stage renal disease: Secondary | ICD-10-CM | POA: Diagnosis not present

## 2016-02-03 DIAGNOSIS — N186 End stage renal disease: Secondary | ICD-10-CM | POA: Diagnosis not present

## 2016-02-03 DIAGNOSIS — D631 Anemia in chronic kidney disease: Secondary | ICD-10-CM | POA: Diagnosis not present

## 2016-02-03 DIAGNOSIS — N2581 Secondary hyperparathyroidism of renal origin: Secondary | ICD-10-CM | POA: Diagnosis not present

## 2016-02-06 DIAGNOSIS — N186 End stage renal disease: Secondary | ICD-10-CM | POA: Diagnosis not present

## 2016-02-06 DIAGNOSIS — N2581 Secondary hyperparathyroidism of renal origin: Secondary | ICD-10-CM | POA: Diagnosis not present

## 2016-02-06 DIAGNOSIS — D631 Anemia in chronic kidney disease: Secondary | ICD-10-CM | POA: Diagnosis not present

## 2016-02-08 DIAGNOSIS — D631 Anemia in chronic kidney disease: Secondary | ICD-10-CM | POA: Diagnosis not present

## 2016-02-08 DIAGNOSIS — N186 End stage renal disease: Secondary | ICD-10-CM | POA: Diagnosis not present

## 2016-02-08 DIAGNOSIS — N2581 Secondary hyperparathyroidism of renal origin: Secondary | ICD-10-CM | POA: Diagnosis not present

## 2016-02-09 ENCOUNTER — Encounter (HOSPITAL_COMMUNITY): Payer: Self-pay | Admitting: Family Medicine

## 2016-02-09 ENCOUNTER — Ambulatory Visit (HOSPITAL_COMMUNITY)
Admission: EM | Admit: 2016-02-09 | Discharge: 2016-02-09 | Disposition: A | Discharge status: Home / Self Care | Payer: Medicare Other | Attending: Emergency Medicine | Admitting: Emergency Medicine

## 2016-02-09 DIAGNOSIS — S161XXA Strain of muscle, fascia and tendon at neck level, initial encounter: Secondary | ICD-10-CM | POA: Diagnosis not present

## 2016-02-09 MED ORDER — DICLOFENAC SODIUM 1 % TD GEL: 1.0000 "application " | Freq: Four times a day (QID) | TRANSDERMAL | 0 refills | Status: DC

## 2016-02-09 NOTE — Discharge Instructions (Signed)
Apply the diclofenac gel 4 times a day. If you notice any itching or swelling after applying this medicine stop using it. Place heat over the area of soreness to her 3 times a day. Perform the stretches as demonstrated to help keep the muscles loose. For any worsening, new symptoms or problems follow-up with your doctor or may return as needed.
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
## 2016-02-10 ENCOUNTER — Ambulatory Visit (INDEPENDENT_AMBULATORY_CARE_PROVIDER_SITE_OTHER): Payer: Medicare Other | Admitting: Gynecology

## 2016-02-10 ENCOUNTER — Encounter: Payer: Self-pay | Admitting: Gynecology

## 2016-02-10 VITALS — BP 132/74

## 2016-02-10 DIAGNOSIS — N8502 Endometrial intraepithelial neoplasia [EIN]: Secondary | ICD-10-CM | POA: Diagnosis not present

## 2016-02-10 DIAGNOSIS — N2581 Secondary hyperparathyroidism of renal origin: Secondary | ICD-10-CM | POA: Diagnosis not present

## 2016-02-10 DIAGNOSIS — D251 Intramural leiomyoma of uterus: Secondary | ICD-10-CM | POA: Diagnosis not present

## 2016-02-10 DIAGNOSIS — N858 Other specified noninflammatory disorders of uterus: Secondary | ICD-10-CM | POA: Diagnosis not present

## 2016-02-10 DIAGNOSIS — N186 End stage renal disease: Secondary | ICD-10-CM | POA: Diagnosis not present

## 2016-02-10 DIAGNOSIS — D631 Anemia in chronic kidney disease: Secondary | ICD-10-CM | POA: Diagnosis not present

## 2016-02-10 NOTE — Addendum Note (Signed)
Addended by: Thurnell Garbe A on: 02/10/2016 03:52 PM   Modules accepted: Orders

## 2016-02-10 NOTE — Progress Notes (Signed)
Patient is a 60 year old who is here for 3 month follow-up endometrial biopsy as a result of her history of complex atypical hyperplasia for which she was placed on high dose Megace 80 mg daily as per the recommendation of GYN oncologist Dr.Emma Denman George since patient has many comorbidities including chronic renal disease of which she has regular dialysis.  Her history is as follows:   Patient was last seen the office on September 12 minute state the following:  "Patient was scheduled to undergo a sonohysterogram today as a result of ultrasound on last office visit demonstrated a thickness of the uterine lining. Patient in 2016 had resectoscopic polypectomy were by benign endometrial polyp was removed which was contributing to her postmenopausal bleeding. She reports no vaginal bleeding since that time. The note from last office visit on September 7 of this year retest follows:  "Patient is a 60 year old who was seen in the office on July 20 of this year for her annual exam. Patient with multiple comorbidities including chronic kidney disease for which she receives periodic dialysis treatment. Review of her record indicated that in 2014 after having had resectoscopic polypectomy as a result of postmenopausal bleeding. The pathology report demonstrated benign endometrial polyp. She denies any vaginal bleeding dissected patient's morbidly obese and has history of fibroid uterus had asked her to return today for follow-up ultrasound to compare with previous study back in 2014. She was otherwise asymptomatic.  Her ultrasound demonstrated the following: Uterus measured 9.2 x 7.5 x 5.0 cm with an endometrial stripe of 8.7 mm patient had 5 fibroids the largest one measuring 24 x 22 mm which was calcified and all were intramural. Right and left ovary were otherwise normal.  When compared with ultrasound in 2014 fibroids have remained the same uterus is same size but her endometrial thickness is of  concerning today although she's had no vaginal bleeding and is on no hormone replacement therapy. For this reason I recommended the patient undergo an endometrial biopsy today."  At that office visit patient had an endometrial biopsy which demonstrated the following:  Diagnosis Endometrium, biopsy - COMPLEX ATYPICAL HYPERPLASIA  With this finding was decided to cancel her sonohysterogram. I have discussed with  that I have [name] that endometrial hyperplasia with atypia has a 20-30% chance of progressing to endometrial cancer and the recommendation would be with a hysterectomy with removal of tubes and ovary. Since she has a history of chronic renal disease and is not interested in surgery I will going to discuss the case with the GYN oncologist to see if we can put on a high progesterone such as Megace 80 mg daily and then resample her in 3 months or place a Mirena IUD. I will contact the patient after I discussed it with GYN oncologist. Literature information was provided"  Patient continues to bleed on and off. She states she has anemia that was told by her infusion center. She is on iron supplementation.  The patient was counseled for endometrial biopsy.  Genitalia with atrophic changes a speculum was placed into the vagina there was no active bleeding noted the cervix was cleansed with Betadine solution. A CO2 tenaculum was placed on the anterior cervical lip the cervix a car dilatation a uterus sounded to 7 cm and a Pipelle was introduced into the uterine cavity and moderate amount of copious tissue was obtained submitted for histological evaluation.  Assessment/plan: 60 year old patient with history of complex atypical hyperplasia with multiple comorbidities is currently on Megace 80  mg daily was here today for follow-up endometrial biopsy. She continues with this pathology report of which are for the GYN oncologist for additional recommendations. We will also want to consider placing a  Mirena IUD if her insurance will cover it we'll wait for the pathology report and discussed with the GYN oncologist.

## 2016-02-10 NOTE — Patient Instructions (Signed)

## 2016-02-13 DIAGNOSIS — D631 Anemia in chronic kidney disease: Secondary | ICD-10-CM | POA: Diagnosis not present

## 2016-02-13 DIAGNOSIS — N186 End stage renal disease: Secondary | ICD-10-CM | POA: Diagnosis not present

## 2016-02-13 DIAGNOSIS — N2581 Secondary hyperparathyroidism of renal origin: Secondary | ICD-10-CM | POA: Diagnosis not present

## 2016-02-15 ENCOUNTER — Emergency Department (HOSPITAL_COMMUNITY): Payer: Medicare Other

## 2016-02-15 ENCOUNTER — Encounter (HOSPITAL_COMMUNITY): Payer: Self-pay | Admitting: Emergency Medicine

## 2016-02-15 ENCOUNTER — Inpatient Hospital Stay (HOSPITAL_COMMUNITY)
Admission: EM | Admit: 2016-02-15 | Discharge: 2016-03-06 | DRG: 216 | Disposition: A | Discharge status: Skilled Nursing Facility | Payer: Medicare Other | Attending: Thoracic Surgery (Cardiothoracic Vascular Surgery) | Admitting: Thoracic Surgery (Cardiothoracic Vascular Surgery)

## 2016-02-15 ENCOUNTER — Inpatient Hospital Stay (HOSPITAL_COMMUNITY): Payer: Medicare Other

## 2016-02-15 DIAGNOSIS — M6281 Muscle weakness (generalized): Secondary | ICD-10-CM

## 2016-02-15 DIAGNOSIS — N186 End stage renal disease: Secondary | ICD-10-CM

## 2016-02-15 DIAGNOSIS — Z6841 Body Mass Index (BMI) 40.0 and over, adult: Secondary | ICD-10-CM

## 2016-02-15 DIAGNOSIS — R262 Difficulty in walking, not elsewhere classified: Secondary | ICD-10-CM

## 2016-02-15 DIAGNOSIS — R531 Weakness: Secondary | ICD-10-CM | POA: Diagnosis not present

## 2016-02-15 DIAGNOSIS — R7881 Bacteremia: Secondary | ICD-10-CM | POA: Diagnosis not present

## 2016-02-15 DIAGNOSIS — A419 Sepsis, unspecified organism: Secondary | ICD-10-CM

## 2016-02-15 DIAGNOSIS — I339 Acute and subacute endocarditis, unspecified: Secondary | ICD-10-CM | POA: Diagnosis not present

## 2016-02-15 DIAGNOSIS — E876 Hypokalemia: Secondary | ICD-10-CM

## 2016-02-15 DIAGNOSIS — M5431 Sciatica, right side: Secondary | ICD-10-CM | POA: Diagnosis not present

## 2016-02-15 DIAGNOSIS — J9811 Atelectasis: Secondary | ICD-10-CM | POA: Diagnosis not present

## 2016-02-15 DIAGNOSIS — Z79899 Other long term (current) drug therapy: Secondary | ICD-10-CM

## 2016-02-15 DIAGNOSIS — T827XXA Infection and inflammatory reaction due to other cardiac and vascular devices, implants and grafts, initial encounter: Principal | ICD-10-CM

## 2016-02-15 DIAGNOSIS — M109 Gout, unspecified: Secondary | ICD-10-CM | POA: Diagnosis present

## 2016-02-15 DIAGNOSIS — I25110 Atherosclerotic heart disease of native coronary artery with unstable angina pectoris: Secondary | ICD-10-CM | POA: Diagnosis not present

## 2016-02-15 DIAGNOSIS — I120 Hypertensive chronic kidney disease with stage 5 chronic kidney disease or end stage renal disease: Secondary | ICD-10-CM | POA: Diagnosis present

## 2016-02-15 DIAGNOSIS — J811 Chronic pulmonary edema: Secondary | ICD-10-CM | POA: Diagnosis not present

## 2016-02-15 DIAGNOSIS — M542 Cervicalgia: Secondary | ICD-10-CM | POA: Diagnosis present

## 2016-02-15 DIAGNOSIS — R918 Other nonspecific abnormal finding of lung field: Secondary | ICD-10-CM | POA: Diagnosis not present

## 2016-02-15 DIAGNOSIS — Z95828 Presence of other vascular implants and grafts: Secondary | ICD-10-CM

## 2016-02-15 DIAGNOSIS — I358 Other nonrheumatic aortic valve disorders: Secondary | ICD-10-CM | POA: Diagnosis not present

## 2016-02-15 DIAGNOSIS — D62 Acute posthemorrhagic anemia: Secondary | ICD-10-CM | POA: Diagnosis not present

## 2016-02-15 DIAGNOSIS — D649 Anemia, unspecified: Secondary | ICD-10-CM

## 2016-02-15 DIAGNOSIS — R4182 Altered mental status, unspecified: Secondary | ICD-10-CM | POA: Diagnosis not present

## 2016-02-15 DIAGNOSIS — M5441 Lumbago with sciatica, right side: Secondary | ICD-10-CM | POA: Diagnosis present

## 2016-02-15 DIAGNOSIS — Z992 Dependence on renal dialysis: Secondary | ICD-10-CM | POA: Diagnosis not present

## 2016-02-15 DIAGNOSIS — Z452 Encounter for adjustment and management of vascular access device: Secondary | ICD-10-CM | POA: Diagnosis not present

## 2016-02-15 DIAGNOSIS — I330 Acute and subacute infective endocarditis: Secondary | ICD-10-CM | POA: Diagnosis not present

## 2016-02-15 DIAGNOSIS — R6521 Severe sepsis with septic shock: Secondary | ICD-10-CM | POA: Diagnosis present

## 2016-02-15 DIAGNOSIS — N2581 Secondary hyperparathyroidism of renal origin: Secondary | ICD-10-CM | POA: Diagnosis present

## 2016-02-15 DIAGNOSIS — D631 Anemia in chronic kidney disease: Secondary | ICD-10-CM | POA: Diagnosis present

## 2016-02-15 DIAGNOSIS — G9340 Encephalopathy, unspecified: Secondary | ICD-10-CM

## 2016-02-15 DIAGNOSIS — I4891 Unspecified atrial fibrillation: Secondary | ICD-10-CM | POA: Diagnosis not present

## 2016-02-15 DIAGNOSIS — Y712 Prosthetic and other implants, materials and accessory cardiovascular devices associated with adverse incidents: Secondary | ICD-10-CM | POA: Diagnosis not present

## 2016-02-15 DIAGNOSIS — M5440 Lumbago with sciatica, unspecified side: Secondary | ICD-10-CM

## 2016-02-15 DIAGNOSIS — B9561 Methicillin susceptible Staphylococcus aureus infection as the cause of diseases classified elsewhere: Secondary | ICD-10-CM

## 2016-02-15 DIAGNOSIS — Z8673 Personal history of transient ischemic attack (TIA), and cerebral infarction without residual deficits: Secondary | ICD-10-CM | POA: Diagnosis not present

## 2016-02-15 DIAGNOSIS — R0602 Shortness of breath: Secondary | ICD-10-CM | POA: Diagnosis not present

## 2016-02-15 DIAGNOSIS — M5126 Other intervertebral disc displacement, lumbar region: Secondary | ICD-10-CM | POA: Diagnosis not present

## 2016-02-15 DIAGNOSIS — B957 Other staphylococcus as the cause of diseases classified elsewhere: Secondary | ICD-10-CM

## 2016-02-15 DIAGNOSIS — R402411 Glasgow coma scale score 13-15, in the field [EMT or ambulance]: Secondary | ICD-10-CM | POA: Diagnosis not present

## 2016-02-15 DIAGNOSIS — M5442 Lumbago with sciatica, left side: Secondary | ICD-10-CM | POA: Diagnosis present

## 2016-02-15 DIAGNOSIS — I25119 Atherosclerotic heart disease of native coronary artery with unspecified angina pectoris: Secondary | ICD-10-CM | POA: Diagnosis not present

## 2016-02-15 DIAGNOSIS — T827XXD Infection and inflammatory reaction due to other cardiac and vascular devices, implants and grafts, subsequent encounter: Secondary | ICD-10-CM | POA: Diagnosis not present

## 2016-02-15 DIAGNOSIS — Z954 Presence of other heart-valve replacement: Secondary | ICD-10-CM | POA: Diagnosis not present

## 2016-02-15 DIAGNOSIS — J90 Pleural effusion, not elsewhere classified: Secondary | ICD-10-CM | POA: Diagnosis not present

## 2016-02-15 DIAGNOSIS — Z01810 Encounter for preprocedural cardiovascular examination: Secondary | ICD-10-CM | POA: Diagnosis not present

## 2016-02-15 DIAGNOSIS — K219 Gastro-esophageal reflux disease without esophagitis: Secondary | ICD-10-CM | POA: Diagnosis present

## 2016-02-15 DIAGNOSIS — N189 Chronic kidney disease, unspecified: Secondary | ICD-10-CM | POA: Diagnosis not present

## 2016-02-15 DIAGNOSIS — Y832 Surgical operation with anastomosis, bypass or graft as the cause of abnormal reaction of the patient, or of later complication, without mention of misadventure at the time of the procedure: Secondary | ICD-10-CM | POA: Diagnosis present

## 2016-02-15 DIAGNOSIS — M5432 Sciatica, left side: Secondary | ICD-10-CM | POA: Diagnosis not present

## 2016-02-15 DIAGNOSIS — N8502 Endometrial intraepithelial neoplasia [EIN]: Secondary | ICD-10-CM | POA: Diagnosis present

## 2016-02-15 DIAGNOSIS — Z8679 Personal history of other diseases of the circulatory system: Secondary | ICD-10-CM

## 2016-02-15 DIAGNOSIS — I639 Cerebral infarction, unspecified: Secondary | ICD-10-CM

## 2016-02-15 DIAGNOSIS — I959 Hypotension, unspecified: Secondary | ICD-10-CM | POA: Diagnosis not present

## 2016-02-15 DIAGNOSIS — R410 Disorientation, unspecified: Secondary | ICD-10-CM | POA: Diagnosis not present

## 2016-02-15 DIAGNOSIS — J810 Acute pulmonary edema: Secondary | ICD-10-CM | POA: Diagnosis not present

## 2016-02-15 DIAGNOSIS — I341 Nonrheumatic mitral (valve) prolapse: Secondary | ICD-10-CM | POA: Diagnosis not present

## 2016-02-15 DIAGNOSIS — R7301 Impaired fasting glucose: Secondary | ICD-10-CM | POA: Diagnosis not present

## 2016-02-15 DIAGNOSIS — N039 Chronic nephritic syndrome with unspecified morphologic changes: Secondary | ICD-10-CM | POA: Diagnosis not present

## 2016-02-15 DIAGNOSIS — I38 Endocarditis, valve unspecified: Secondary | ICD-10-CM | POA: Diagnosis not present

## 2016-02-15 DIAGNOSIS — T82838A Hemorrhage of vascular prosthetic devices, implants and grafts, initial encounter: Secondary | ICD-10-CM | POA: Diagnosis not present

## 2016-02-15 DIAGNOSIS — N185 Chronic kidney disease, stage 5: Secondary | ICD-10-CM | POA: Diagnosis not present

## 2016-02-15 DIAGNOSIS — Z0000 Encounter for general adult medical examination without abnormal findings: Secondary | ICD-10-CM | POA: Diagnosis not present

## 2016-02-15 DIAGNOSIS — Z952 Presence of prosthetic heart valve: Secondary | ICD-10-CM

## 2016-02-15 DIAGNOSIS — Z7982 Long term (current) use of aspirin: Secondary | ICD-10-CM

## 2016-02-15 DIAGNOSIS — I351 Nonrheumatic aortic (valve) insufficiency: Secondary | ICD-10-CM

## 2016-02-15 LAB — BLOOD GAS, ARTERIAL
ACID-BASE EXCESS: 8.9 mmol/L — AB (ref 0.0–2.0)
Bicarbonate: 32.1 mmol/L — ABNORMAL HIGH (ref 20.0–28.0)
DRAWN BY: 301361
FIO2: 32
O2 SAT: 99.6 %
PATIENT TEMPERATURE: 98.6
pCO2 arterial: 37 mmHg (ref 32.0–48.0)
pH, Arterial: 7.547 — ABNORMAL HIGH (ref 7.350–7.450)
pO2, Arterial: 163 mmHg — ABNORMAL HIGH (ref 83.0–108.0)

## 2016-02-15 LAB — COMPREHENSIVE METABOLIC PANEL
ALBUMIN: 2.6 g/dL — AB (ref 3.5–5.0)
ALT: 15 U/L (ref 14–54)
ANION GAP: 17 — AB (ref 5–15)
AST: 23 U/L (ref 15–41)
Alkaline Phosphatase: 116 U/L (ref 38–126)
BILIRUBIN TOTAL: 1.2 mg/dL (ref 0.3–1.2)
BUN: 18 mg/dL (ref 6–20)
CALCIUM: 10 mg/dL (ref 8.9–10.3)
CO2: 28 mmol/L (ref 22–32)
Chloride: 92 mmol/L — ABNORMAL LOW (ref 101–111)
Creatinine, Ser: 7.08 mg/dL — ABNORMAL HIGH (ref 0.44–1.00)
GFR calc non Af Amer: 6 mL/min — ABNORMAL LOW (ref >60)
GFR, EST AFRICAN AMERICAN: 6 mL/min — AB (ref >60)
GLUCOSE: 102 mg/dL — AB (ref 65–99)
POTASSIUM: 2.8 mmol/L — AB (ref 3.5–5.1)
SODIUM: 137 mmol/L (ref 135–145)
TOTAL PROTEIN: 6.8 g/dL (ref 6.5–8.1)

## 2016-02-15 LAB — CBC
HCT: 23.7 % — ABNORMAL LOW (ref 36.0–46.0)
HEMATOCRIT: 21.6 % — AB (ref 36.0–46.0)
HEMOGLOBIN: 6.7 g/dL — AB (ref 12.0–15.0)
Hemoglobin: 7.5 g/dL — ABNORMAL LOW (ref 12.0–15.0)
MCH: 26.6 pg (ref 26.0–34.0)
MCH: 26 pg (ref 26.0–34.0)
MCHC: 31.6 g/dL (ref 30.0–36.0)
MCHC: 31 g/dL (ref 30.0–36.0)
MCV: 84 fL (ref 78.0–100.0)
MCV: 83.7 fL (ref 78.0–100.0)
PLATELETS: 402 10*3/uL — AB (ref 150–400)
Platelets: 421 10*3/uL — ABNORMAL HIGH (ref 150–400)
RBC: 2.82 MIL/uL — ABNORMAL LOW (ref 3.87–5.11)
RBC: 2.58 MIL/uL — AB (ref 3.87–5.11)
RDW: 19.4 % — AB (ref 11.5–15.5)
RDW: 19.6 % — ABNORMAL HIGH (ref 11.5–15.5)
WBC: 14.4 10*3/uL — ABNORMAL HIGH (ref 4.0–10.5)
WBC: 13.3 10*3/uL — ABNORMAL HIGH (ref 4.0–10.5)

## 2016-02-15 LAB — PREPARE RBC (CROSSMATCH)

## 2016-02-15 LAB — I-STAT CG4 LACTIC ACID, ED: Lactic Acid, Venous: 2.06 mmol/L (ref 0.5–1.9)

## 2016-02-15 LAB — ABO/RH: ABO/RH(D): A POS

## 2016-02-15 LAB — CREATININE, SERUM
Creatinine, Ser: 7.9 mg/dL — ABNORMAL HIGH (ref 0.44–1.00)
GFR calc Af Amer: 6 mL/min — ABNORMAL LOW (ref >60)
GFR calc non Af Amer: 5 mL/min — ABNORMAL LOW (ref >60)

## 2016-02-15 LAB — AMMONIA: Ammonia: 12 umol/L (ref 9–35)

## 2016-02-15 LAB — ETHANOL

## 2016-02-15 LAB — PROTIME-INR
INR: 1.16
PROTHROMBIN TIME: 14.9 s (ref 11.4–15.2)

## 2016-02-15 LAB — LACTIC ACID, PLASMA: Lactic Acid, Venous: 1.3 mmol/L (ref 0.5–1.9)

## 2016-02-15 LAB — CBG MONITORING, ED: Glucose-Capillary: 94 mg/dL (ref 65–99)

## 2016-02-15 MED ORDER — ALTEPLASE 2 MG IJ SOLR: 2.0000 mg | Freq: Once | INTRAMUSCULAR | Status: DC | PRN

## 2016-02-15 MED ORDER — MEGESTROL ACETATE 40 MG PO TABS
40.0000 mg | ORAL_TABLET | Freq: Two times a day (BID) | ORAL | Status: DC
  Administered 2016-02-16 – 2016-02-18 (×4): 40 mg via ORAL
  Filled 2016-02-15 (×6): qty 1

## 2016-02-15 MED ORDER — SODIUM CHLORIDE 0.9 % IV SOLN: 100.0000 mL | INTRAVENOUS | Status: DC | PRN

## 2016-02-15 MED ORDER — POTASSIUM CHLORIDE CRYS ER 20 MEQ PO TBCR: 40.0000 meq | EXTENDED_RELEASE_TABLET | Freq: Two times a day (BID) | ORAL | Status: DC

## 2016-02-15 MED ORDER — PRORENAL VITAL PO TABS: 1.0000 | ORAL_TABLET | Freq: Every day | ORAL | Status: DC

## 2016-02-15 MED ORDER — ACETAMINOPHEN 650 MG RE SUPP: 650.0000 mg | Freq: Four times a day (QID) | RECTAL | Status: DC | PRN

## 2016-02-15 MED ORDER — SODIUM CHLORIDE 0.9 % IV SOLN
8.0000 mg/h | INTRAVENOUS | Status: AC
  Administered 2016-02-15 – 2016-02-17 (×4): 8 mg/h via INTRAVENOUS
  Filled 2016-02-15 (×11): qty 80

## 2016-02-15 MED ORDER — HEPARIN SODIUM (PORCINE) 1000 UNIT/ML DIALYSIS: 1000.0000 [IU] | INTRAMUSCULAR | Status: DC | PRN

## 2016-02-15 MED ORDER — METHOCARBAMOL 500 MG PO TABS
500.0000 mg | ORAL_TABLET | Freq: Two times a day (BID) | ORAL | Status: DC
  Administered 2016-02-16 – 2016-02-21 (×10): 500 mg via ORAL
  Filled 2016-02-15 (×10): qty 1

## 2016-02-15 MED ORDER — ACETAMINOPHEN 500 MG PO TABS: 1000.0000 mg | ORAL_TABLET | Freq: Once | ORAL | Status: DC

## 2016-02-15 MED ORDER — ACETAMINOPHEN 650 MG RE SUPP
650.0000 mg | Freq: Once | RECTAL | Status: AC
  Administered 2016-02-15: 650 mg via RECTAL
  Filled 2016-02-15: qty 1

## 2016-02-15 MED ORDER — RENA-VITE PO TABS
1.0000 | ORAL_TABLET | Freq: Every day | ORAL | Status: DC
  Administered 2016-02-16 – 2016-02-24 (×9): 1 via ORAL
  Filled 2016-02-15 (×9): qty 1

## 2016-02-15 MED ORDER — ALBUTEROL SULFATE (2.5 MG/3ML) 0.083% IN NEBU: 3.0000 mL | INHALATION_SOLUTION | Freq: Four times a day (QID) | RESPIRATORY_TRACT | Status: DC | PRN

## 2016-02-15 MED ORDER — SODIUM CHLORIDE 0.9 % IV SOLN
80.0000 mg | Freq: Once | INTRAVENOUS | Status: AC
  Administered 2016-02-15: 80 mg via INTRAVENOUS
  Filled 2016-02-15: qty 80

## 2016-02-15 MED ORDER — SODIUM CHLORIDE 0.9 % IV SOLN
Freq: Once | INTRAVENOUS | Status: AC
  Administered 2016-02-17: 14:00:00 via INTRAVENOUS

## 2016-02-15 MED ORDER — ASPIRIN EC 81 MG PO TBEC
81.0000 mg | DELAYED_RELEASE_TABLET | Freq: Every day | ORAL | Status: DC
  Administered 2016-02-16 – 2016-02-23 (×7): 81 mg via ORAL
  Filled 2016-02-15 (×8): qty 1

## 2016-02-15 MED ORDER — PANTOPRAZOLE SODIUM 40 MG IV SOLR
40.0000 mg | Freq: Two times a day (BID) | INTRAVENOUS | Status: DC
  Administered 2016-02-18 – 2016-02-20 (×5): 40 mg via INTRAVENOUS
  Filled 2016-02-15 (×5): qty 40

## 2016-02-15 MED ORDER — FAMOTIDINE 20 MG PO TABS: 20.0000 mg | ORAL_TABLET | Freq: Every day | ORAL | Status: DC | PRN

## 2016-02-15 MED ORDER — PENTAFLUOROPROP-TETRAFLUOROETH EX AERO: 1.0000 "application " | INHALATION_SPRAY | CUTANEOUS | Status: DC | PRN

## 2016-02-15 MED ORDER — SODIUM CHLORIDE 0.9 % IV BOLUS (SEPSIS): 500.0000 mL | Freq: Once | INTRAVENOUS | Status: DC

## 2016-02-15 MED ORDER — CINACALCET HCL 30 MG PO TABS
60.0000 mg | ORAL_TABLET | Freq: Every evening | ORAL | Status: DC
Start: 2016-02-15 — End: 2016-03-06
  Administered 2016-02-16 – 2016-03-04 (×16): 60 mg via ORAL
  Filled 2016-02-15 (×16): qty 2

## 2016-02-15 MED ORDER — SEVELAMER CARBONATE 800 MG PO TABS
1600.0000 mg | ORAL_TABLET | Freq: Three times a day (TID) | ORAL | Status: DC
  Administered 2016-02-16 – 2016-03-06 (×37): 1600 mg via ORAL
  Filled 2016-02-15 (×40): qty 2

## 2016-02-15 MED ORDER — ACETAMINOPHEN 325 MG PO TABS
650.0000 mg | ORAL_TABLET | Freq: Four times a day (QID) | ORAL | Status: DC | PRN
  Administered 2016-02-16: 650 mg via ORAL
  Filled 2016-02-15: qty 2

## 2016-02-15 MED ORDER — MIDODRINE HCL 5 MG PO TABS
10.0000 mg | ORAL_TABLET | Freq: Two times a day (BID) | ORAL | Status: DC
  Administered 2016-02-16 – 2016-02-21 (×8): 10 mg via ORAL
  Filled 2016-02-15 (×8): qty 2

## 2016-02-15 MED ORDER — LIDOCAINE-PRILOCAINE 2.5-2.5 % EX CREA: 1.0000 "application " | TOPICAL_CREAM | CUTANEOUS | Status: DC | PRN

## 2016-02-15 MED ORDER — HEPARIN SODIUM (PORCINE) 5000 UNIT/ML IJ SOLN
5000.0000 [IU] | Freq: Three times a day (TID) | INTRAMUSCULAR | Status: DC
  Administered 2016-02-16 – 2016-02-24 (×17): 5000 [IU] via SUBCUTANEOUS
  Filled 2016-02-15 (×19): qty 1

## 2016-02-15 MED ORDER — LIDOCAINE HCL (PF) 1 % IJ SOLN: 5.0000 mL | INTRAMUSCULAR | Status: DC | PRN

## 2016-02-15 MED ORDER — POTASSIUM CHLORIDE CRYS ER 20 MEQ PO TBCR: 20.0000 meq | EXTENDED_RELEASE_TABLET | Freq: Once | ORAL | Status: DC

## 2016-02-15 MED ORDER — IPRATROPIUM BROMIDE 0.06 % NA SOLN
2.0000 | Freq: Four times a day (QID) | NASAL | Status: DC
  Administered 2016-02-16 – 2016-02-24 (×17): 2 via NASAL
  Filled 2016-02-15 (×4): qty 15

## 2016-02-15 NOTE — Progress Notes (Signed)
CRITICAL VALUE ALERT  Critical value received:  Hgb 6.7  Date of notification:  12/20  Time of notification:  19:21   Critical value read back:Yes.    Nurse who received alert:  Kathyrn Sheriff   MD notified (1st page):  Lindell Noe  Time of first page:  19:22  MD notified (2nd page):  Time of second page:  Responding MD:  Lindell Noe  Time MD responded:  19:22  Patient in HD, MD will call HD with orders

## 2016-02-15 NOTE — ED Notes (Signed)
Dr. Lincoln Brigham, admitting MD, at bedside.

## 2016-02-15 NOTE — H&P (Signed)
[facility] Admission History and Physical Service Pager: [phone]  Patient name: [name] Marks Medical record number: [id number] Date of birth: 10/24/1955 Age: 60 y.o. Gender: female  Primary Care Provider: Lockie Pares, MD Consultants: Nephrology Code Status: Full  Chief Complaint: AMS  Assessment and Plan: [name] Marks is a 60 y.o. female presenting with AMS . PMH is significant for ESRD on HD, complex atypical endometrial hyperplasia   AMS- Altered in the sense that she is not speaking or following directions. No overt neurological defects, moving all extremities spontaneously and apparently purposeful ( moving blankets), tracking with her eyes. CT head was normal, however not able to fully rule out intracranial process. Differential for AMS also includes, intracranial process like stroke, metabolic derangement, sepsis. Afebrile, mildly elevated WBC to 14.4 lactate 2.06 so infection less likley.  - Admit to step down with telemetry, Dr Erin Hearing attending - MRI brain to rule out stroke - SLP - NPO - PT/OT - Neuro consult found to have stroke  ESRD- K 2.8, BUN 18, due for dialysis today. Concern for fistula malfunction which could delay HD. If her fistula is no longer able to be used may need to consider HD cath - follow renal function  - nephrology consulted  AV Fistula bleeding- bleeding AV fistula noted in the ED after her initial exam - Vascular Surgery consult for concern for fistula compromise  Recent AVF infection- Per daughter's report, no evidence of infection on exam, Afebrile, mildly elevated WBC to 14.4 lactate 2.06 - continue to monitor  Complex Atypical Endometrial Hyperplasia - continue Megace  FEN/GI: NPOt, SLIV Prophylaxis: heparin  Disposition: Step down, Dr Erin Hearing attending  History of Present Illness:  [name] Marks is a 60 y.o. female presenting with alterd mental status  History is provided by her  daughters  The patient was last noted to be normal this AM when her son was taking her to dialysis. During the trip she was noted to stop talking to him. At dialysis she did not speak, nor did she follow commands. The Dialysis center then called EMS. Per her daughter, she did not have recent chest pain, SOB, fevers. Of note she was recently treated for right AV fistula infection. Her daughter is not sure when that happened or if she completed her antibiotics In the ED she was largely stable. One provider noted left sided mouth droop that lasted <3 minutes. She was noted to be moving all extremities at that time.  Prior to this, the patient lived by herself and cared for herself. Her family denies her ever having any similar symptoms in past.   Review Of Systems: Per HPI with the following additions:No recent headaches, weakness, falls  ROS  Patient Active Problem List   Diagnosis Date Noted  . Altered mental status 02/15/2016  . Renal dialysis device, implant, or graft complication 17/51/0258  . Pain and swelling of right upper extremity 12/20/2015  . Complex endometrial hyperplasia with atypia 11/08/2015  . Increased endometrial stripe thickness 11/03/2015  . Obesity 10/28/2014  . Shoulder pain, bilateral 10/28/2014  . Health care maintenance 10/28/2014  . Hypotension, unspecified 01/14/2013  . Right carotid bruit 01/08/2013  . Generalized headaches 01/08/2013  . Chronic female pelvic pain 08/08/2012  . Gout, unspecified 12/06/2008  . Esophageal reflux 12/06/2008  . BACK PAIN 12/06/2008  . FIBROIDS, UTERUS 11/24/2008  . RENAL DISEASE, END STAGE 07/01/2006    Past Medical History: Past Medical History:  Diagnosis Date  .  Complication of anesthesia    due to kidney disease  . End stage renal disease on dialysis ([place])   . GERD (gastroesophageal reflux disease)   . Gout     Past Surgical History: Past Surgical History:  Procedure Laterality Date  . DG AV DIALYSIS GRAFT  DECLOT OR    . DILATATION & CURETTAGE/HYSTEROSCOPY WITH TRUECLEAR N/A 11/06/2012   Procedure: DILATATION & CURETTAGE/HYSTEROSCOPY WITH TRUECLEAR;  Surgeon: Terrance Mass, MD;  Location: [facility];  Service: Gynecology;  Laterality: N/A;  Truclear Resectoscopic Polypectomy   . PERIPHERAL VASCULAR CATHETERIZATION N/A 09/14/2014   Procedure: A/V Shuntogram/Fistulagram;  Surgeon: Katha Cabal, MD;  Location: [place] [facility];  Service: Cardiovascular;  Laterality: N/A;  . PERIPHERAL VASCULAR CATHETERIZATION N/A 09/14/2014   Procedure: A/V Shunt Intervention;  Surgeon: Katha Cabal, MD;  Location: [place] [facility];  Service: Cardiovascular;  Laterality: N/A;  . PERIPHERAL VASCULAR CATHETERIZATION Right 12/09/2014   Procedure: A/V Shuntogram/Fistulagram;  Surgeon: Algernon Huxley, MD;  Location: [place] [facility];  Service: Cardiovascular;  Laterality: Right;  . PERIPHERAL VASCULAR CATHETERIZATION N/A 12/09/2014   Procedure: A/V Shunt Intervention;  Surgeon: Algernon Huxley, MD;  Location: [place] [facility];  Service: Cardiovascular;  Laterality: N/A;  . PERIPHERAL VASCULAR CATHETERIZATION Right 05/24/2015   Procedure: A/V Shuntogram;  Surgeon: Serafina Mitchell, MD;  Location: [place] [facility];  Service: Cardiovascular;  Laterality: Right;  . PERIPHERAL VASCULAR CATHETERIZATION Right 05/24/2015   Procedure: Peripheral Vascular Balloon Angioplasty;  Surgeon: Serafina Mitchell, MD;  Location: [place] [facility];  Service: Cardiovascular;  Laterality: Right;  right arm shunt  . PERIPHERAL VASCULAR CATHETERIZATION N/A 06/13/2015   Procedure: A/V Shuntogram/Fistulagram;  Surgeon: Algernon Huxley, MD;  Location: [place] [facility];  Service: Cardiovascular;  Laterality: N/A;  . PERIPHERAL VASCULAR CATHETERIZATION N/A 06/13/2015   Procedure: A/V Shunt Intervention;  Surgeon: Algernon Huxley, MD;  Location: [place] [facility];  Service: Cardiovascular;  Laterality: N/A;  . TUBAL LIGATION  1983    Social  History: Social History  Substance Use Topics  . Smoking status: Never Smoker  . Smokeless tobacco: Never Used  . Alcohol use No   Additional social history: Family reports she does not smoke, drink etoh, or do recreational drugs Please also refer to relevant sections of EMR.  Family History: Family History  Problem Relation Age of Onset  . Diabetes Mother   . Hypertension Mother   . Heart disease Mother   . Alcohol abuse Mother   . Kidney disease Mother   . Cancer Father     COLON  . Alcohol abuse Father   . Breast cancer Sister 63  . Hypertension Sister   . Kidney disease Sister   . Hypertension Son     Allergies and Medications: Allergies  Allergen Reactions  . Contrast Media [Iodinated Diagnostic Agents] Anaphylaxis  . Ancef [Cefazolin Sodium] Nausea And Vomiting  . Naproxen Sodium Itching   No current facility-administered medications on file prior to encounter.    Current Outpatient Prescriptions on File Prior to Encounter  Medication Sig Dispense Refill  . albuterol (PROVENTIL HFA;VENTOLIN HFA) 108 (90 BASE) MCG/ACT inhaler Inhale 2 puffs into the lungs every 6 (six) hours as needed for wheezing.    Marland Kitchen aspirin EC 81 MG tablet Take 81 mg by mouth daily.    . B Complex-C-Folic Acid (RENAL VITAMIN PO) Take 1 tablet by mouth at bedtime.    . cinacalcet (SENSIPAR) 60  MG tablet Take 60 mg by mouth every evening.     . diclofenac sodium (VOLTAREN) 1 % GEL Apply 1 application topically 4 (four) times daily. Prn neck muscle pain. 100 g 0  . doxycycline (VIBRA-TABS) 100 MG tablet TAKE 1 TABLET BY MOUTH TWICE A DAY 30 tablet 0  . famotidine (PEPCID) 20 MG tablet Take 20 mg by mouth daily as needed for heartburn or indigestion.    Marland Kitchen ibuprofen (ADVIL,MOTRIN) 200 MG tablet Take 400 mg by mouth every 6 (six) hours as needed for pain.    Marland Kitchen ipratropium (ATROVENT) 0.06 % nasal spray Place 2 sprays into both nostrils 4 (four) times daily. 15 mL 1  . megestrol (MEGACE) 40 MG tablet  Take 1 tablet (40 mg total) by mouth 2 (two) times daily. 60 tablet 0  . methocarbamol (ROBAXIN) 500 MG tablet Take 1 tablet (500 mg total) by mouth 2 (two) times daily. 30 tablet 0  . midodrine (PROAMATINE) 10 MG tablet Take 10 mg by mouth 2 (two) times daily.     . Multiple Vitamins-Minerals (PRORENAL VITAL) TABS Take 1 tablet by mouth daily.    . sevelamer (RENVELA) 800 MG tablet Take 1,600 mg by mouth 3 (three) times daily with meals.       Objective: BP (!) 134/47   Pulse 94   Temp 99.5 F (37.5 C) (Rectal)   Resp 21   Wt 221 lb (100.2 kg)   SpO2 95%   BMI 41.76 kg/m  Exam: General: Elderly woman laying in bed, no acute distress, grabbing intermittently at lines and blankets Eyes: EOMI ENTM: MMM Neck: supple neck Cardiovascular: RRR, no murmurs noted Respiratory: CTAB anteriorly Gastrointestinal: soft, some grimace with deep palpation of abdomen but no rebound or guarding, + BS MSK: right AV fistula with blood stained dressing, no active bleeding Derm: no rashes or lesions noted Neuro: states " I'm fine" to all questions, does not follow commands, tracks with eyes Psych: unable to assess due to AMS  Labs and Imaging: CBC BMET   Recent Labs Lab 02/15/16 0822  WBC 14.4*  HGB 7.5*  HCT 23.7*  PLT 421*    Recent Labs Lab 02/15/16 0822  NA 137  K 2.8*  CL 92*  CO2 28  BUN 18  CREATININE 7.08*  GLUCOSE 102*  CALCIUM 10.0      CT Head  Atrophy with periventricular small vessel disease. No intracranial mass, hemorrhage, or extra-axial fluid collection. No acute appearing infarct appreciable. Mild vascular calcification noted.  Veatrice Bourbon, MD 02/15/2016, 11:16 AM PGY-3, [facility] pager: [phone], text pages welcome

## 2016-02-15 NOTE — Progress Notes (Signed)
Attempted ABG time 2. Will notify nightshift charge RT

## 2016-02-15 NOTE — ED Notes (Signed)
Pressure being held at this time to bleeding R fistula.  Dr, Lincoln Brigham paged.

## 2016-02-15 NOTE — Progress Notes (Signed)
Patient admitted to floor, shortly after patient taken to dialysis. Medications still not verified by pharmacy, and not given. HD RN made aware of bolus order. Full assessment was not completed. Attempted to collect urine specimen, but patient has been unable to make urine.
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
## 2016-02-15 NOTE — ED Notes (Signed)
Dr. Florene Glen, nephrologist, at bedside.

## 2016-02-15 NOTE — Consult Note (Signed)
HPI: I was asked by Dr. Erin Hearing to see [name] Marks who is a 60 y.o. female with a hx of ESRD on HD since 2004. She has had a recent challenge with an infected RUE AVG.  I believe that access was placed at [facility] in the past by Dr. Lucky Cowboy. There apparently have been issues with infection s/p doxycycline PO and prolonged bleeding.  In the ED the AVG needed to be wrapped due to bleeding.  Her last day of HD was Monday.  She present now sent from dialysis due to altered mental status. SHe denies new medications.  Past Medical History:  Diagnosis Date  . Complication of anesthesia    due to kidney disease  . End stage renal disease on dialysis ([place])   . GERD (gastroesophageal reflux disease)   . Gout    Past Surgical History:  Procedure Laterality Date  . DG AV DIALYSIS GRAFT DECLOT OR    . DILATATION & CURETTAGE/HYSTEROSCOPY WITH TRUECLEAR N/A 11/06/2012   Procedure: DILATATION & CURETTAGE/HYSTEROSCOPY WITH TRUECLEAR;  Surgeon: Terrance Mass, MD;  Location: [facility];  Service: Gynecology;  Laterality: N/A;  Truclear Resectoscopic Polypectomy   . PERIPHERAL VASCULAR CATHETERIZATION N/A 09/14/2014   Procedure: A/V Shuntogram/Fistulagram;  Surgeon: Katha Cabal, MD;  Location: [place] [facility];  Service: Cardiovascular;  Laterality: N/A;  . PERIPHERAL VASCULAR CATHETERIZATION N/A 09/14/2014   Procedure: A/V Shunt Intervention;  Surgeon: Katha Cabal, MD;  Location: [place] [facility];  Service: Cardiovascular;  Laterality: N/A;  . PERIPHERAL VASCULAR CATHETERIZATION Right 12/09/2014   Procedure: A/V Shuntogram/Fistulagram;  Surgeon: Algernon Huxley, MD;  Location: [place] [facility];  Service: Cardiovascular;  Laterality: Right;  . PERIPHERAL VASCULAR CATHETERIZATION N/A 12/09/2014   Procedure: A/V Shunt Intervention;  Surgeon: Algernon Huxley, MD;  Location: [place] [facility];  Service: Cardiovascular;  Laterality: N/A;  . PERIPHERAL VASCULAR CATHETERIZATION Right 05/24/2015    Procedure: A/V Shuntogram;  Surgeon: Serafina Mitchell, MD;  Location: [place] [facility];  Service: Cardiovascular;  Laterality: Right;  . PERIPHERAL VASCULAR CATHETERIZATION Right 05/24/2015   Procedure: Peripheral Vascular Balloon Angioplasty;  Surgeon: Serafina Mitchell, MD;  Location: [place] [facility];  Service: Cardiovascular;  Laterality: Right;  right arm shunt  . PERIPHERAL VASCULAR CATHETERIZATION N/A 06/13/2015   Procedure: A/V Shuntogram/Fistulagram;  Surgeon: Algernon Huxley, MD;  Location: [place] [facility];  Service: Cardiovascular;  Laterality: N/A;  . PERIPHERAL VASCULAR CATHETERIZATION N/A 06/13/2015   Procedure: A/V Shunt Intervention;  Surgeon: Algernon Huxley, MD;  Location: [place] [facility];  Service: Cardiovascular;  Laterality: N/A;  . TUBAL LIGATION  1983   Social History:  reports that she has never smoked. She has never used smokeless tobacco. She reports that she does not drink alcohol or use drugs. Allergies:  Allergies  Allergen Reactions  . Contrast Media [Iodinated Diagnostic Agents] Anaphylaxis  . Ancef [Cefazolin Sodium] Nausea And Vomiting  . Naproxen Sodium Itching   Family History  Problem Relation Age of Onset  . Diabetes Mother   . Hypertension Mother   . Heart disease Mother   . Alcohol abuse Mother   . Kidney disease Mother   . Cancer Father     COLON  . Alcohol abuse Father   . Breast cancer Sister 29  . Hypertension Sister   . Kidney disease Sister   . Hypertension Son     Medications:  Prior to Admission:  (Not in a hospital admission)  Scheduled: . [START ON 02/18/2016] pantoprazole  40 mg Intravenous Q12H   Continuous: . pantoprozole (PROTONIX) infusion 8 mg/hr (02/15/16 1132)    ROS: not reliable due to altered mentation Blood pressure (!) 114/39, pulse 94, temperature 99.5 F (37.5 C), temperature source Rectal, resp. rate 20, weight 100.2 kg (221 lb), SpO2 95 %.  General appearance: alert and cooperative Head: Normocephalic,  without obvious abnormality, atraumatic Eyes: conjunctivae/corneas clear. PERRL, EOM's intact. Fundi benign. Ears: normal TM's and external ear canals both ears Resp: clear to auscultation bilaterally Chest wall: no tenderness Cardio: regular rate and rhythm, S1, S2 normal, no murmur, click, rub or gallop GI: soft, non-tender; bowel sounds normal; no masses,  no organomegaly Extremities: extremities normal, atraumatic, no cyanosis or edema  AVGG RUE functioning with pulse and thrill, antecubital region wrapped Skin: Skin color, texture, turgor normal. No rashes or lesions Neurologic: cannot complete her thoughts, moves allextremities. Results for orders placed or performed during the hospital encounter of 02/15/16 (from the past 48 hour(s))  CBC     Status: Abnormal   Collection Time: 02/15/16  8:22 AM  Result Value Ref Range   WBC 14.4 (H) 4.0 - 10.5 K/uL   RBC 2.82 (L) 3.87 - 5.11 MIL/uL   Hemoglobin 7.5 (L) 12.0 - 15.0 g/dL   HCT 23.7 (L) 36.0 - 46.0 %   MCV 84.0 78.0 - 100.0 fL   MCH 26.6 26.0 - 34.0 pg   MCHC 31.6 30.0 - 36.0 g/dL   RDW 19.4 (H) 11.5 - 15.5 %   Platelets 421 (H) 150 - 400 K/uL  Comprehensive metabolic panel     Status: Abnormal   Collection Time: 02/15/16  8:22 AM  Result Value Ref Range   Sodium 137 135 - 145 mmol/L   Potassium 2.8 (L) 3.5 - 5.1 mmol/L   Chloride 92 (L) 101 - 111 mmol/L   CO2 28 22 - 32 mmol/L   Glucose, Bld 102 (H) 65 - 99 mg/dL   BUN 18 6 - 20 mg/dL   Creatinine, Ser 7.08 (H) 0.44 - 1.00 mg/dL   Calcium 10.0 8.9 - 10.3 mg/dL   Total Protein 6.8 6.5 - 8.1 g/dL   Albumin 2.6 (L) 3.5 - 5.0 g/dL   AST 23 15 - 41 U/L   ALT 15 14 - 54 U/L   Alkaline Phosphatase 116 38 - 126 U/L   Total Bilirubin 1.2 0.3 - 1.2 mg/dL   GFR calc non Af Amer 6 (L) >60 mL/min   GFR calc Af Amer 6 (L) >60 mL/min    Comment: (NOTE) The eGFR has been calculated using the CKD EPI equation. This calculation has not been validated in all clinical  situations. eGFR's persistently <60 mL/min signify possible Chronic Kidney Disease.    Anion gap 17 (H) 5 - 15  Protime-INR     Status: None   Collection Time: 02/15/16  8:22 AM  Result Value Ref Range   Prothrombin Time 14.9 11.4 - 15.2 seconds   INR 1.16   Type and screen     Status: None   Collection Time: 02/15/16  8:27 AM  Result Value Ref Range   ABO/RH(D) A POS    Antibody Screen NEG    Sample Expiration 02/18/2016   ABO/Rh     Status: None   Collection Time: 02/15/16  8:27 AM  Result Value Ref Range   ABO/RH(D) A POS   CBG monitoring, ED     Status: None   Collection  Time: 02/15/16  9:23 AM  Result Value Ref Range   Glucose-Capillary 94 65 - 99 mg/dL  I-Stat CG4 Lactic Acid, ED     Status: Abnormal   Collection Time: 02/15/16 11:07 AM  Result Value Ref Range   Lactic Acid, Venous 2.06 (HH) 0.5 - 1.9 mmol/L   Comment NOTIFIED PHYSICIAN   Ethanol     Status: None   Collection Time: 02/15/16 11:30 AM  Result Value Ref Range   Alcohol, Ethyl (B) <5 <5 mg/dL    Comment:        LOWEST DETECTABLE LIMIT FOR SERUM ALCOHOL IS 5 mg/dL FOR MEDICAL PURPOSES ONLY   Ammonia     Status: None   Collection Time: 02/15/16 11:30 AM  Result Value Ref Range   Ammonia 12 9 - 35 umol/L   Dg Chest 2 View  Result Date: 02/15/2016 CLINICAL DATA:  Confusion.  Chronic renal disease. EXAM: CHEST  2 VIEW COMPARISON:  Jul 08, 2014 FINDINGS: There are stents in each axillary region. There is no edema or consolidation. Heart is slightly enlarged with pulmonary vascularity within normal limits. There is atherosclerotic calcification in the aorta. No adenopathy. No bone lesions. IMPRESSION: No edema or consolidation. Mild cardiac prominence. Aortic atherosclerosis. Electronically Signed   By: Lowella Grip III M.D.   On: 02/15/2016 08:54   Ct Head Wo Contrast  Result Date: 02/15/2016 CLINICAL DATA:  Confusion EXAM: CT HEAD WITHOUT CONTRAST TECHNIQUE: Contiguous axial images were obtained  from the base of the skull through the vertex without intravenous contrast. COMPARISON:  None. FINDINGS: Brain: There is moderate diffuse atrophy. There is no intracranial mass, hemorrhage, extra-axial fluid collection, or midline shift. There is patchy small vessel disease throughout the centra semiovale bilaterally, most notably anteriorly and slightly more on the right than the left. Elsewhere gray-white compartments appear normal. No acute appearing infarct evident. Vascular: No hyperdense vessel. There is mild calcification in the carotid siphon regions, slightly more on the right the left. Skull: Bony calvarium appears intact. Sinuses/Orbits: Visualized paranasal sinuses clear. Orbits appear symmetric bilaterally. Other: Visualized mastoid air cells are clear. IMPRESSION: Atrophy with periventricular small vessel disease. No intracranial mass, hemorrhage, or extra-axial fluid collection. No acute appearing infarct appreciable. Mild vascular calcification noted. Electronically Signed   By: Lowella Grip III M.D.   On: 02/15/2016 09:14    Assessment:  1 ESRD MWF<4hr, EDW 99.5kg, 2k,2.0ca,> 2 Altered mental status 3 AVGG bleed and recent infx  Plan: 1 HD today  2 VVS to eval AV access for inxf or functionality 3 r/o infection  Jamyron Redd C 02/15/2016, 1:41 PM

## 2016-02-15 NOTE — ED Provider Notes (Addendum)
[facility] DEPT Provider Note   CSN: [id number] Arrival date & time: 02/15/16  7654     History   Chief Complaint Chief Complaint  Patient presents with  . Altered Mental Status  . Abnormal Lab    HPI [name] Marks is a 60 y.o. female.  HPI  A LEVEL 5 CAVEAT PERTAINS DUE TO ALTERED MENTAL STATUS Pt with hx of ESRD on dialysis went for dialysis this morning and was found to have low hemoglobin and was confused.  She is not able to contribute to the history.  She denied pain for EMS.  She does not answer many questions, but at times will answer yes or no.  Unknown when symptoms began or when her last dialysis session was- was not dialysed today.    Past Medical History:  Diagnosis Date  . Complication of anesthesia    due to kidney disease  . End stage renal disease on dialysis ([place])   . GERD (gastroesophageal reflux disease)   . Gout     Patient Active Problem List   Diagnosis Date Noted  . Altered mental status 02/15/2016  . Renal dialysis device, implant, or graft complication 65/04/5463  . Pain and swelling of right upper extremity 12/20/2015  . Complex endometrial hyperplasia with atypia 11/08/2015  . Increased endometrial stripe thickness 11/03/2015  . Obesity 10/28/2014  . Shoulder pain, bilateral 10/28/2014  . Health care maintenance 10/28/2014  . Hypotension, unspecified 01/14/2013  . Right carotid bruit 01/08/2013  . Generalized headaches 01/08/2013  . Chronic female pelvic pain 08/08/2012  . Gout, unspecified 12/06/2008  . Esophageal reflux 12/06/2008  . BACK PAIN 12/06/2008  . FIBROIDS, UTERUS 11/24/2008  . RENAL DISEASE, END STAGE 07/01/2006    Past Surgical History:  Procedure Laterality Date  . DG AV DIALYSIS GRAFT DECLOT OR    . DILATATION & CURETTAGE/HYSTEROSCOPY WITH TRUECLEAR N/A 11/06/2012   Procedure: DILATATION & CURETTAGE/HYSTEROSCOPY WITH TRUECLEAR;  Surgeon: Terrance Mass, MD;  Location: [facility];  Service: Gynecology;   Laterality: N/A;  Truclear Resectoscopic Polypectomy   . PERIPHERAL VASCULAR CATHETERIZATION N/A 09/14/2014   Procedure: A/V Shuntogram/Fistulagram;  Surgeon: Katha Cabal, MD;  Location: [facility];  Service: Cardiovascular;  Laterality: N/A;  . PERIPHERAL VASCULAR CATHETERIZATION N/A 09/14/2014   Procedure: A/V Shunt Intervention;  Surgeon: Katha Cabal, MD;  Location: [facility];  Service: Cardiovascular;  Laterality: N/A;  . PERIPHERAL VASCULAR CATHETERIZATION Right 12/09/2014   Procedure: A/V Shuntogram/Fistulagram;  Surgeon: Algernon Huxley, MD;  Location: [facility];  Service: Cardiovascular;  Laterality: Right;  . PERIPHERAL VASCULAR CATHETERIZATION N/A 12/09/2014   Procedure: A/V Shunt Intervention;  Surgeon: Algernon Huxley, MD;  Location: [facility];  Service: Cardiovascular;  Laterality: N/A;  . PERIPHERAL VASCULAR CATHETERIZATION Right 05/24/2015   Procedure: A/V Shuntogram;  Surgeon: Serafina Mitchell, MD;  Location: [facility];  Service: Cardiovascular;  Laterality: Right;  . PERIPHERAL VASCULAR CATHETERIZATION Right 05/24/2015   Procedure: Peripheral Vascular Balloon Angioplasty;  Surgeon: Serafina Mitchell, MD;  Location: [facility];  Service: Cardiovascular;  Laterality: Right;  right arm shunt  . PERIPHERAL VASCULAR CATHETERIZATION N/A 06/13/2015   Procedure: A/V Shuntogram/Fistulagram;  Surgeon: Algernon Huxley, MD;  Location: [facility];  Service: Cardiovascular;  Laterality: N/A;  . PERIPHERAL VASCULAR CATHETERIZATION N/A 06/13/2015   Procedure: A/V Shunt Intervention;  Surgeon: Algernon Huxley, MD;  Location: [facility];  Service: Cardiovascular;  Laterality: N/A;  . TUBAL LIGATION  1983    OB History    Gravida Para Term Preterm AB Living   2 2       2    SAB TAB Ectopic Multiple Live Births                   Home Medications    Prior to Admission medications   Medication Sig Start Date End Date  Taking? Authorizing Provider  albuterol (PROVENTIL HFA;VENTOLIN HFA) 108 (90 BASE) MCG/ACT inhaler Inhale 2 puffs into the lungs every 6 (six) hours as needed for wheezing.    Historical Provider, MD  aspirin EC 81 MG tablet Take 81 mg by mouth daily.    Historical Provider, MD  B Complex-C-Folic Acid (RENAL VITAMIN PO) Take 1 tablet by mouth at bedtime.    Historical Provider, MD  cinacalcet (SENSIPAR) 60 MG tablet Take 60 mg by mouth every evening.     Historical Provider, MD  diclofenac sodium (VOLTAREN) 1 % GEL Apply 1 application topically 4 (four) times daily. Prn neck muscle pain. 02/09/16   Janne Napoleon, NP  doxycycline (VIBRA-TABS) 100 MG tablet TAKE 1 TABLET BY MOUTH TWICE A DAY 01/23/16   Kimberly A Stegmayer, PA-C  famotidine (PEPCID) 20 MG tablet Take 20 mg by mouth daily as needed for heartburn or indigestion.    Historical Provider, MD  ibuprofen (ADVIL,MOTRIN) 200 MG tablet Take 400 mg by mouth every 6 (six) hours as needed for pain.    Historical Provider, MD  ipratropium (ATROVENT) 0.06 % nasal spray Place 2 sprays into both nostrils 4 (four) times daily. 05/26/13   Billy Fischer, MD  megestrol (MEGACE) 40 MG tablet Take 1 tablet (40 mg total) by mouth 2 (two) times daily. 01/02/16   Terrance Mass, MD  methocarbamol (ROBAXIN) 500 MG tablet Take 1 tablet (500 mg total) by mouth 2 (two) times daily. 08/24/15   Billy Fischer, MD  midodrine (PROAMATINE) 10 MG tablet Take 10 mg by mouth 2 (two) times daily.     Historical Provider, MD  Multiple Vitamins-Minerals (PRORENAL VITAL) TABS Take 1 tablet by mouth daily.    Historical Provider, MD  sevelamer (RENVELA) 800 MG tablet Take 1,600 mg by mouth 3 (three) times daily with meals.     Historical Provider, MD    Family History Family History  Problem Relation Age of Onset  . Diabetes Mother   . Hypertension Mother   . Heart disease Mother   . Alcohol abuse Mother   . Kidney disease Mother   . Cancer Father     COLON  . Alcohol  abuse Father   . Breast cancer Sister 68  . Hypertension Sister   . Kidney disease Sister   . Hypertension Son     Social History Social History  Substance Use Topics  . Smoking status: Never Smoker  . Smokeless tobacco: Never Used  . Alcohol use No     Allergies   Contrast media [iodinated diagnostic agents]; Ancef [cefazolin sodium]; and Naproxen sodium   Review of Systems Review of Systems  UNABLE TO OBTAIN ROS DUE TO LEVEL 5 CAVEAT   Physical Exam Updated Vital Signs BP (!) 142/45   Pulse 94   Temp 99.5 F (37.5 C) (Rectal)   Resp 20   Wt 100.2 kg   SpO2 95%   BMI 41.76 kg/m  Vitals reviewed Physical Exam Physical Examination: General appearance - ill appearing, nad Mental status -  alert, cannot assess orientation Eyes - pupils equal and reactive, no conjunctival injection, no scleral icterus Mouth - mucous membranes moist, pharynx normal without lesions Neck - supple, no significant adenopathy Chest - clear to auscultation, no wheezes, rales or rhonchi, symmetric air entry Heart - normal rate, regular rhythm, normal S1, S2, no murmurs, rubs, clicks or gallops Abdomen - soft, nontender, nondistended, no masses or organomegaly Neurological - awake, not answering questions, moving all extremities, no facial droop, making purposeful movements but will not answer questions reliably, will not follow commands Extremities - , AV fistula in right upper extremity, no active bleeding, no surrounding erythema or pus draining, peripheral pulses normal, no pedal edema, no clubbing or cyanosis Skin - normal coloration and turgor, no rashes, skin is dry appearing  ED Treatments / Results  Labs (all labs ordered are listed, but only abnormal results are displayed) Labs Reviewed  CBC - Abnormal; Notable for the following:       Result Value   WBC 14.4 (*)    RBC 2.82 (*)    Hemoglobin 7.5 (*)    HCT 23.7 (*)    RDW 19.4 (*)    Platelets 421 (*)    All other components  within normal limits  COMPREHENSIVE METABOLIC PANEL - Abnormal; Notable for the following:    Potassium 2.8 (*)    Chloride 92 (*)    Glucose, Bld 102 (*)    Creatinine, Ser 7.08 (*)    Albumin 2.6 (*)    GFR calc non Af Amer 6 (*)    GFR calc Af Amer 6 (*)    Anion gap 17 (*)    All other components within normal limits  I-STAT CG4 LACTIC ACID, ED - Abnormal; Notable for the following:    Lactic Acid, Venous 2.06 (*)    All other components within normal limits  CULTURE, BLOOD (ROUTINE X 2)  CULTURE, BLOOD (ROUTINE X 2)  ETHANOL  AMMONIA  CDS SEROLOGY  CBG MONITORING, ED  TYPE AND SCREEN  ABO/RH    EKG  EKG Interpretation  Date/Time:  Wednesday February 15 2016 08:12:44 EST Ventricular Rate:  96 PR Interval:    QRS Duration: 93 QT Interval:  403 QTC Calculation: 510 R Axis:   61 Text Interpretation:  Sinus rhythm Ventricular premature complex Aberrant conduction of SV complex(es) Prolonged QT interval Since previous tracing QT has lengthened Confirmed by Canary Brim  MD, MARTHA [phone]) on 02/15/2016 8:20:11 AM       Radiology Dg Chest 2 View  Result Date: 02/15/2016 CLINICAL DATA:  Confusion.  Chronic renal disease. EXAM: CHEST  2 VIEW COMPARISON:  Jul 08, 2014 FINDINGS: There are stents in each axillary region. There is no edema or consolidation. Heart is slightly enlarged with pulmonary vascularity within normal limits. There is atherosclerotic calcification in the aorta. No adenopathy. No bone lesions. IMPRESSION: No edema or consolidation. Mild cardiac prominence. Aortic atherosclerosis. Electronically Signed   By: Lowella Grip III M.D.   On: 02/15/2016 08:54   Ct Head Wo Contrast  Result Date: 02/15/2016 CLINICAL DATA:  Confusion EXAM: CT HEAD WITHOUT CONTRAST TECHNIQUE: Contiguous axial images were obtained from the base of the skull through the vertex without intravenous contrast. COMPARISON:  None. FINDINGS: Brain: There is moderate diffuse atrophy. There is  no intracranial mass, hemorrhage, extra-axial fluid collection, or midline shift. There is patchy small vessel disease throughout the centra semiovale bilaterally, most notably anteriorly and slightly more on the right than the left. Elsewhere  gray-white compartments appear normal. No acute appearing infarct evident. Vascular: No hyperdense vessel. There is mild calcification in the carotid siphon regions, slightly more on the right the left. Skull: Bony calvarium appears intact. Sinuses/Orbits: Visualized paranasal sinuses clear. Orbits appear symmetric bilaterally. Other: Visualized mastoid air cells are clear. IMPRESSION: Atrophy with periventricular small vessel disease. No intracranial mass, hemorrhage, or extra-axial fluid collection. No acute appearing infarct appreciable. Mild vascular calcification noted. Electronically Signed   By: Lowella Grip III M.D.   On: 02/15/2016 09:14    Procedures Procedures (including critical care time)  Medications Ordered in ED Medications  pantoprazole (PROTONIX) 80 mg in sodium chloride 0.9 % 250 mL (0.32 mg/mL) infusion (8 mg/hr Intravenous New Bag/Given 02/15/16 1132)  pantoprazole (PROTONIX) injection 40 mg (not administered)  acetaminophen (TYLENOL) suppository 650 mg (650 mg Rectal Given 02/15/16 1033)  pantoprazole (PROTONIX) 80 mg in sodium chloride 0.9 % 100 mL IVPB (0 mg Intravenous Stopped 02/15/16 1103)     Initial Impression / Assessment and Plan / ED Course  I have reviewed the triage vital signs and the nursing notes.  Pertinent labs & imaging results that were available during my care of the patient were reviewed by me and considered in my medical decision making (see chart for details).  Clinical Course   10:41 AM d/w family practice resident for admission.  Attending is [place].  Pt to go to telemetry bed.  Will let renal know that she is here and will need dialysis.  11:12 AM bed request changed to stepdown bed.     11:22 AM  d/w Dr. Florene Glen, nephrology for dialysis.  He has taken her name.    Pt presenting with altered mental status, her neuro exam is nonfocal- she has a low grade fever and WBC- CXR reassuring, no other focal signs of infection.  Doubt she is uremic having only missed dialysis today.  Head CT reassuring.  Blood cultures obtained, etoh, ammonia, tox screen ordered- d/w family medicine resident for admission.  They agree with holding on antibiotics for now.  Pt to go to step down bed.   1:51 PM while waiting for bed pt has become more alert- asking where she is, etc.    Final Clinical Impressions(s) / ED Diagnoses   Final diagnoses:  Altered mental status, unspecified altered mental status type  End stage renal disease on dialysis ([place])  Anemia, unspecified type  Hypokalemia    New Prescriptions New Prescriptions   No medications on file     Alfonzo Beers, MD 02/15/16 [place], MD 02/15/16 1351

## 2016-02-15 NOTE — Consult Note (Signed)
[facility]  Patient name: [name] Marks MRN: [id number] DOB: June 15, 1955 Sex: female  REASON FOR CONSULT: Bleeding from right arm AV access graft  HPI: [name] Marks is a 60 y.o. female, who is present to the emergency room this morning with altered mental status. While here she had some bleeding from her AV graft. She is unable to give any history currently. She is here today with her family reports that this is not her typical in that she is unable to answer questions. She has been seen at [facility] for wound issues regarding this access. Had been seen as recently as 2 weeks ago. This wound been treated with Silvadene and by mouth doxycycline and had not had any new issues.  Past Medical History:  Diagnosis Date  . Complication of anesthesia    due to kidney disease  . End stage renal disease on dialysis ([place])   . GERD (gastroesophageal reflux disease)   . Gout     Family History  Problem Relation Age of Onset  . Diabetes Mother   . Hypertension Mother   . Heart disease Mother   . Alcohol abuse Mother   . Kidney disease Mother   . Cancer Father     COLON  . Alcohol abuse Father   . Breast cancer Sister 66  . Hypertension Sister   . Kidney disease Sister   . Hypertension Son     SOCIAL HISTORY: Social History   Social History  . Marital status: Single    Spouse name: N/A  . Number of children: N/A  . Years of education: N/A   Occupational History  . Not on file.   Social History Main Topics  . Smoking status: Never Smoker  . Smokeless tobacco: Never Used  . Alcohol use No  . Drug use: No  . Sexual activity: Yes    Birth control/ protection: Post-menopausal   Other Topics Concern  . Not on file   Social History Narrative  . No narrative on file    Allergies  Allergen Reactions  . Contrast Media [Iodinated Diagnostic Agents] Anaphylaxis  . Ancef [Cefazolin Sodium]  Nausea And Vomiting  . Naproxen Sodium Itching    Current Facility-Administered Medications  Medication Dose Route Frequency Provider Last Rate Last Dose  . pantoprazole (PROTONIX) 80 mg in sodium chloride 0.9 % 250 mL (0.32 mg/mL) infusion  8 mg/hr Intravenous Continuous Alfonzo Beers, MD 25 mL/hr at 02/15/16 1132 8 mg/hr at 02/15/16 1132  . [START ON 02/18/2016] pantoprazole (PROTONIX) injection 40 mg  40 mg Intravenous Q12H Alfonzo Beers, MD       Current Outpatient Prescriptions  Medication Sig Dispense Refill  . cinacalcet (SENSIPAR) 60 MG tablet Take 60 mg by mouth every evening.     Marland Kitchen ibuprofen (ADVIL,MOTRIN) 200 MG tablet Take 600 mg by mouth every 6 (six) hours as needed for moderate pain.     . megestrol (MEGACE) 40 MG tablet Take 1 tablet (40 mg total) by mouth 2 (two) times daily. 60 tablet 0  . midodrine (PROAMATINE) 10 MG tablet Take 10 mg by mouth 2 (two) times daily.     . naproxen (NAPROSYN) 500 MG tablet Take 500 mg by mouth 2 (two) times daily as needed for moderate pain.    . silver sulfADIAZINE (SILVADENE) 1 % cream Apply 1 application topically 2 (two) times daily as needed (wound).    Marland Kitchen albuterol (PROVENTIL HFA;VENTOLIN HFA) 108 (90  BASE) MCG/ACT inhaler Inhale 2 puffs into the lungs every 6 (six) hours as needed for wheezing.    Marland Kitchen aspirin EC 81 MG tablet Take 81 mg by mouth daily.    . B Complex-C-Folic Acid (RENAL VITAMIN PO) Take 1 tablet by mouth at bedtime.    . diclofenac sodium (VOLTAREN) 1 % GEL Apply 1 application topically 4 (four) times daily. Prn neck muscle pain. 100 g 0  . doxycycline (VIBRA-TABS) 100 MG tablet TAKE 1 TABLET BY MOUTH TWICE A DAY 30 tablet 0  . famotidine (PEPCID) 20 MG tablet Take 20 mg by mouth daily as needed for heartburn or indigestion.    Marland Kitchen ipratropium (ATROVENT) 0.06 % nasal spray Place 2 sprays into both nostrils 4 (four) times daily. 15 mL 1  . methocarbamol (ROBAXIN) 500 MG tablet Take 1 tablet (500 mg total) by mouth 2 (two)  times daily. 30 tablet 0  . Multiple Vitamins-Minerals (PRORENAL VITAL) TABS Take 1 tablet by mouth daily.    . sevelamer (RENVELA) 800 MG tablet Take 1,600 mg by mouth 3 (three) times daily with meals.       REVIEW OF SYSTEMS:  Reviewed in her history and physical with nothing to add PHYSICAL EXAM: Vitals:   02/15/16 1300 02/15/16 1330 02/15/16 1400 02/15/16 1430  BP: (!) 143/125 (!) 114/39 (!) 125/50 (!) 145/47  Pulse:      Resp: 21 20 20 26   Temp:      TempSrc:      SpO2:      Weight:        GENERAL: The patient is a well-nourished female, in no acute distress. The vital signs are documented above. CARDIOVASCULAR: Right AV access with good thrill. There is an area of eschar in the upper portion of the graft. In the lower portion the graft above the antecubital space there had been bleeding. This is not bleeding at all currently and the graft was manipulated and there was no evidence of disruption into the graft. PULMONARY: There is good air exchange  ABDOMEN: Soft and non-tender  MUSCULOSKELETAL: There are no major deformities or cyanosis. NEUROLOGIC: No focal weakness or paresthesias are detected. SKIN: There are no ulcers or rashes noted. PSYCHIATRIC: Minimal response to questions answering yes and no.    MEDICAL ISSUES: Altered mental status being admitted for workup of this. Has been followed with [facility] for this issue regarding her access. No ongoing bleeding. Would recommend the observation only until the issue regarding her altered mental status is cleared. Will follow.   Rosetta Posner, MD FACS [facility] Tel [phone] Pager [phone]

## 2016-02-15 NOTE — ED Notes (Signed)
ED Provider at bedside. 

## 2016-02-15 NOTE — ED Notes (Signed)
EKG given to Dr. Linker 

## 2016-02-16 ENCOUNTER — Ambulatory Visit: Payer: Medicare Other | Admitting: Gynecology

## 2016-02-16 ENCOUNTER — Encounter (HOSPITAL_COMMUNITY): Payer: Self-pay | Admitting: General Practice

## 2016-02-16 DIAGNOSIS — R6521 Severe sepsis with septic shock: Secondary | ICD-10-CM

## 2016-02-16 DIAGNOSIS — Z841 Family history of disorders of kidney and ureter: Secondary | ICD-10-CM

## 2016-02-16 DIAGNOSIS — Z886 Allergy status to analgesic agent status: Secondary | ICD-10-CM

## 2016-02-16 DIAGNOSIS — A419 Sepsis, unspecified organism: Secondary | ICD-10-CM

## 2016-02-16 DIAGNOSIS — Z881 Allergy status to other antibiotic agents status: Secondary | ICD-10-CM

## 2016-02-16 DIAGNOSIS — Z800 Family history of malignant neoplasm of digestive organs: Secondary | ICD-10-CM

## 2016-02-16 DIAGNOSIS — Z811 Family history of alcohol abuse and dependence: Secondary | ICD-10-CM

## 2016-02-16 DIAGNOSIS — Z91041 Radiographic dye allergy status: Secondary | ICD-10-CM

## 2016-02-16 DIAGNOSIS — R7881 Bacteremia: Secondary | ICD-10-CM

## 2016-02-16 DIAGNOSIS — D649 Anemia, unspecified: Secondary | ICD-10-CM

## 2016-02-16 DIAGNOSIS — G9340 Encephalopathy, unspecified: Secondary | ICD-10-CM

## 2016-02-16 DIAGNOSIS — Z8249 Family history of ischemic heart disease and other diseases of the circulatory system: Secondary | ICD-10-CM

## 2016-02-16 DIAGNOSIS — T827XXA Infection and inflammatory reaction due to other cardiac and vascular devices, implants and grafts, initial encounter: Secondary | ICD-10-CM

## 2016-02-16 DIAGNOSIS — Z803 Family history of malignant neoplasm of breast: Secondary | ICD-10-CM

## 2016-02-16 DIAGNOSIS — Z833 Family history of diabetes mellitus: Secondary | ICD-10-CM

## 2016-02-16 LAB — SURGICAL PCR SCREEN
MRSA, PCR: NEGATIVE
Staphylococcus aureus: NEGATIVE

## 2016-02-16 LAB — CBC
HCT: 22.3 % — ABNORMAL LOW (ref 36.0–46.0)
HEMATOCRIT: 23.2 % — AB (ref 36.0–46.0)
HEMOGLOBIN: 7.1 g/dL — AB (ref 12.0–15.0)
Hemoglobin: 7.5 g/dL — ABNORMAL LOW (ref 12.0–15.0)
MCH: 27 pg (ref 26.0–34.0)
MCH: 26.5 pg (ref 26.0–34.0)
MCHC: 32.3 g/dL (ref 30.0–36.0)
MCHC: 31.8 g/dL (ref 30.0–36.0)
MCV: 83.5 fL (ref 78.0–100.0)
MCV: 83.2 fL (ref 78.0–100.0)
Platelets: 300 10*3/uL (ref 150–400)
Platelets: 287 10*3/uL (ref 150–400)
RBC: 2.78 MIL/uL — ABNORMAL LOW (ref 3.87–5.11)
RBC: 2.68 MIL/uL — AB (ref 3.87–5.11)
RDW: 18.6 % — AB (ref 11.5–15.5)
RDW: 19 % — ABNORMAL HIGH (ref 11.5–15.5)
WBC: 15.8 10*3/uL — ABNORMAL HIGH (ref 4.0–10.5)
WBC: 16.1 10*3/uL — ABNORMAL HIGH (ref 4.0–10.5)

## 2016-02-16 LAB — IRON AND TIBC
Iron: 16 ug/dL — ABNORMAL LOW (ref 28–170)
SATURATION RATIOS: 16 % (ref 10.4–31.8)
TIBC: 99 ug/dL — ABNORMAL LOW (ref 250–450)
UIBC: 83 ug/dL

## 2016-02-16 LAB — PREPARE RBC (CROSSMATCH)

## 2016-02-16 LAB — BASIC METABOLIC PANEL
Anion gap: 11 (ref 5–15)
BUN: 16 mg/dL (ref 6–20)
CALCIUM: 8.9 mg/dL (ref 8.9–10.3)
CO2: 28 mmol/L (ref 22–32)
CREATININE: 5.63 mg/dL — AB (ref 0.44–1.00)
Chloride: 98 mmol/L — ABNORMAL LOW (ref 101–111)
GFR calc Af Amer: 9 mL/min — ABNORMAL LOW (ref >60)
GFR calc non Af Amer: 7 mL/min — ABNORMAL LOW (ref >60)
GLUCOSE: 96 mg/dL (ref 65–99)
Potassium: 3.2 mmol/L — ABNORMAL LOW (ref 3.5–5.1)
Sodium: 137 mmol/L (ref 135–145)

## 2016-02-16 LAB — BLOOD CULTURE ID PANEL (REFLEXED)
Acinetobacter baumannii: NOT DETECTED
CANDIDA ALBICANS: NOT DETECTED
CANDIDA TROPICALIS: NOT DETECTED
Candida glabrata: NOT DETECTED
Candida krusei: NOT DETECTED
Candida parapsilosis: NOT DETECTED
ENTEROBACTER CLOACAE COMPLEX: NOT DETECTED
ENTEROBACTERIACEAE SPECIES: NOT DETECTED
ENTEROCOCCUS SPECIES: NOT DETECTED
Escherichia coli: NOT DETECTED
Haemophilus influenzae: NOT DETECTED
KLEBSIELLA PNEUMONIAE: NOT DETECTED
Klebsiella oxytoca: NOT DETECTED
LISTERIA MONOCYTOGENES: NOT DETECTED
METHICILLIN RESISTANCE: NOT DETECTED
NEISSERIA MENINGITIDIS: NOT DETECTED
Proteus species: NOT DETECTED
Pseudomonas aeruginosa: NOT DETECTED
STAPHYLOCOCCUS SPECIES: DETECTED — AB
STREPTOCOCCUS AGALACTIAE: NOT DETECTED
STREPTOCOCCUS SPECIES: NOT DETECTED
Serratia marcescens: NOT DETECTED
Staphylococcus aureus (BCID): NOT DETECTED
Streptococcus pneumoniae: NOT DETECTED
Streptococcus pyogenes: NOT DETECTED

## 2016-02-16 LAB — FERRITIN: Ferritin: 942 ng/mL — ABNORMAL HIGH (ref 11–307)

## 2016-02-16 LAB — CDS SEROLOGY

## 2016-02-16 MED ORDER — VANCOMYCIN HCL 10 G IV SOLR
2000.0000 mg | Freq: Once | INTRAVENOUS | Status: AC
  Administered 2016-02-16: 2000 mg via INTRAVENOUS
  Filled 2016-02-16: qty 2000

## 2016-02-16 MED ORDER — CEFAZOLIN IN D5W 1 GM/50ML IV SOLN
1.0000 g | INTRAVENOUS | Status: DC
  Filled 2016-02-16: qty 50

## 2016-02-16 MED ORDER — CEFAZOLIN SODIUM-DEXTROSE 2-4 GM/100ML-% IV SOLN
2.0000 g | INTRAVENOUS | Status: DC
  Filled 2016-02-16: qty 100

## 2016-02-16 MED ORDER — VANCOMYCIN HCL IN DEXTROSE 1-5 GM/200ML-% IV SOLN: 1000.0000 mg | INTRAVENOUS | Status: DC

## 2016-02-16 MED ORDER — ONDANSETRON HCL 4 MG/2ML IJ SOLN: 4.0000 mg | Freq: Every day | INTRAMUSCULAR | Status: DC | PRN

## 2016-02-16 MED ORDER — CEFAZOLIN SODIUM-DEXTROSE 2-4 GM/100ML-% IV SOLN: 2.0000 g | INTRAVENOUS | Status: DC

## 2016-02-16 MED ORDER — SODIUM CHLORIDE 0.9 % IV SOLN
Freq: Once | INTRAVENOUS | Status: AC
  Administered 2016-02-16: 21:00:00 via INTRAVENOUS

## 2016-02-16 NOTE — Progress Notes (Signed)
FPTS Interim Progress Note  S: Paged by RN to notify that patient's bandaged was saturated with blood.  Last change was apparently earlier this afternoon.  RN reports that patient was aided to restroom and her fistula started bleeding briskly.  Upon RN's check, bleeding had stopped and saturated bandage was changed.  Patient reports feeling fine at this time.  She endorses some fatigue.  Denies SOB, palpitations.    Daughter asks to speak privately and wanted to apologize for any misbehaviors on her mother's part.  She notes that she "is not sure why she is acting badly at times and is sorry".  She also wanted to make sure that anesthesia was aware that her mother has a hard time coming out of sedation.   O: BP (!) 88/27 (BP Location: Left Arm)   Pulse 86   Temp 97.7 F (36.5 C) (Oral)   Resp 18   Ht 5\' 1"  (1.549 m)   Wt 209 lb 7 oz (95 kg)   SpO2 99%   BMI 39.57 kg/m   Gen: awake, alert, NAD Cardio: regular rhythm Pulm: normal WOB on room air Ext: RUE with graft which is no longer bleeding.  Scant blood on bandage.  No oozing from wound.  LE with no edema; WWP  A/P: Sherae A [name] is a 61 y.o. female that is here for infected HD graft.  She is planned to have surgery in the am.  HD not planned until after.  Her last hgb this evening was 7.1.  Given recent bleed and hypotension, will go ahead and transfuse 2 units over next 8 hours in anticipation for surgery tomorrow.  Orders placed and orders also discussed with RN.  Will continue to follow BP closely overnight and monitor for signs of fluid overload in the setting of ESRD on HD and transfusion as aforementioned.  RN to obtain post transfusion H/H.  Janora Norlander, DO 02/16/2016, 8:16 PM PGY-3, [facility] pager [phone]
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
## 2016-02-16 NOTE — Evaluation (Signed)
Occupational Therapy Evaluation Patient Details Name: [name] Marks MRN: [id number] DOB: 1956-01-13 Today's Date: 02/16/2016    History of Present Illness [name] Marks is a 60 y.o. female presenting with AMS . PMH is significant for ESRD on HD, complex atypical endometrial hyperplasia   Clinical Impression   Pt with decline in function and safety with ADLs and ADL mobility with decreased strength, balance and endurance. Pt is limited by back pain. Pt will be going to OR after HD tomorrow. Pt's daughter concerned about family not being able to provide any 24/7 sup or assist for going home and that she will still be going to HD. Pt would benefit from acute OT services to address impairments to increase level of function and safety    Follow Up Recommendations  SNF    Equipment Recommendations  Other (comment) (TBD at next venue of care)    Recommendations for Other Services PT consult     Precautions / Restrictions Precautions Precautions: Fall Precaution Comments: R UE dressing from previous HD site bleeding Restrictions Weight Bearing Restrictions: No      Mobility Bed Mobility Overal bed mobility: Needs Assistance Bed Mobility: Supine to Sit;Sit to Supine     Supine to sit: Mod assist Sit to supine: Min assist   General bed mobility comments: increased time and min A with elevating trunk to sit up and for LEs back onto bed. Mod A  to scoot to EOB using pad  Transfers Overall transfer level: Needs assistance Equipment used: Rolling walker (2 wheeled) Transfers: Sit to/from Stand Sit to Stand: Mod assist              Balance Overall balance assessment: Needs assistance   Sitting balance-Leahy Scale: Good       Standing balance-Leahy Scale: Poor                              ADL Overall ADL's : Needs assistance/impaired     Grooming: Wash/dry hands;Wash/dry face;Sitting;Set up   Upper Body Bathing: Supervision/ safety;Set  up;Sitting   Lower Body Bathing: Moderate assistance   Upper Body Dressing : Supervision/safety;Set up;Sitting   Lower Body Dressing: Maximal assistance   Toilet Transfer: Moderate assistance;BSC;RW (simulated to chair)   Toileting- Clothing Manipulation and Hygiene: Moderate assistance       Functional mobility during ADLs: Moderate assistance;Cueing for safety;Rolling walker General ADL Comments: pt limited by back pain     Vision  wears glasses, no change from baseline              Pertinent Vitals/Pain Pain Assessment: 0-10 Pain Score: 8  Pain Location: back Pain Descriptors / Indicators: Aching Pain Intervention(s): Limited activity within patient's tolerance;Monitored during session     Hand Dominance Left   Extremity/Trunk Assessment Upper Extremity Assessment Upper Extremity Assessment: Generalized weakness;RUE deficits/detail;LUE deficits/detail RUE Deficits / Details: limted to 90 degrees shoulder flexion, but functional. Pt states that she had full ROM PTA LUE Deficits / Details: limted to 90 degrees shoulder flexion, but functional. Pt states that she had full ROM PTA   Lower Extremity Assessment Lower Extremity Assessment: Defer to PT evaluation       Communication Communication Communication: No difficulties   Cognition Arousal/Alertness: Awake/alert Behavior During Therapy: WFL for tasks assessed/performed Overall Cognitive Status: Within Functional Limits for tasks assessed  General Comments   pt very pleasant and cooperative, daughter very supportive                 Home Living Family/patient expects to be discharged to:: Private residence Living Arrangements: Alone Available Help at Discharge: Available PRN/intermittently Type of Home: House Home Access: Stairs to enter Technical brewer of Steps: 3   [place]: One level     Bathroom Shower/Tub: Occupational psychologist: Standard      Home Equipment: Environmental consultant - standard          Prior Functioning/Environment Level of Independence: Independent with assistive device(s)  Gait / Transfers Assistance Needed: pt reports that she uses walker most of the time ADL's / Homemaking Assistance Needed: was independent with ADLs            OT Problem List: Obesity;Decreased activity tolerance;Decreased knowledge of use of DME or AE;Decreased strength;Impaired balance (sitting and/or standing);Pain   OT Treatment/Interventions: Self-care/ADL training;DME and/or AE instruction;Therapeutic activities;Patient/family education    OT Goals(Current goals can be found in the care plan section) Acute Rehab OT Goals Patient Stated Goal: go home OT Goal Formulation: With patient/family Time For Goal Achievement: 02/23/16 Potential to Achieve Goals: Good ADL Goals Pt Will Perform Grooming: with min guard assist;standing Pt Will Perform Lower Body Bathing: with min assist;sitting/lateral leans;sit to/from stand Pt Will Perform Lower Body Dressing: with mod assist;sitting/lateral leans;sit to/from stand Pt Will Transfer to Toilet: with min assist;with min guard assist;ambulating;regular height toilet;bedside commode;grab bars Pt Will Perform Toileting - Clothing Manipulation and hygiene: with min assist;with min guard assist;sit to/from stand  OT Frequency: Min 2X/week   Barriers to D/C: Decreased caregiver support  pt at home alone and will not ba able to have 24 hour sup/assist at this time                     End of Session Equipment Utilized During Treatment: Gait belt;Rolling walker;Other (comment) (BSC)  Activity Tolerance: Patient limited by fatigue;Patient limited by pain Patient left: in bed;with call bell/phone within reach;with bed alarm set;with family/visitor present   Time: 3903-0092 OT Time Calculation (min): 27 min Charges:  OT Evaluation $OT Eval Low Complexity: 1 Procedure $OT Eval High Complexity: 1  Procedure OT Treatments $Therapeutic Activity: 8-22 mins G-Codes:    Britt Bottom 02/16/2016, 11:47 AM

## 2016-02-16 NOTE — Consult Note (Addendum)
Date of Admission:  02/15/2016  Date of Consult:  02/16/2016  Reason for Consult: Methicillin sensitive coag negative staphylococcal bacteremia and AV graft infection Referring Physician: Dr. Erin Hearing   HPI:  [name] Marks is an 60 y.o. female with end-stage renal disease on hemodialysis, who was brought to the emergency department via EMS after she became confused and stopped following directions during hemodialysis. She apparently was having some bleeding from her AV fistula graft site as well as frank purulence from this site. She is brought to the ER she was still profoundly come used. She had a CT scan and then an MRI of the brain which failed to show any evidence of acute stroke or infection. Blood cultures were drawn which have subsequently grown methicillin sensitive coagulase-negative staph as a data filed on BCID assay.  She was placed on vancomycin and overnight she has had dramatic improvement in his now is completely lucid and knows where she is. She has been seen by vascular surgery were planning on excision of at least part of her graft though is possible the entire graft may need to be resected. Apparently she is running out of options for vascular access having been a hemodialysis patient for 18 years.     Past Medical History:  Diagnosis Date  . Complication of anesthesia    due to kidney disease  . End stage renal disease on dialysis ([place])   . GERD (gastroesophageal reflux disease)   . Gout     Past Surgical History:  Procedure Laterality Date  . DG AV DIALYSIS GRAFT DECLOT OR    . DILATATION & CURETTAGE/HYSTEROSCOPY WITH TRUECLEAR N/A 11/06/2012   Procedure: DILATATION & CURETTAGE/HYSTEROSCOPY WITH TRUECLEAR;  Surgeon: Terrance Mass, MD;  Location: [facility];  Service: Gynecology;  Laterality: N/A;  Truclear Resectoscopic Polypectomy   . PERIPHERAL VASCULAR CATHETERIZATION N/A 09/14/2014   Procedure: A/V Shuntogram/Fistulagram;  Surgeon: Katha Cabal, MD;  Location: [facility];  Service: Cardiovascular;  Laterality: N/A;  . PERIPHERAL VASCULAR CATHETERIZATION N/A 09/14/2014   Procedure: A/V Shunt Intervention;  Surgeon: Katha Cabal, MD;  Location: [facility];  Service: Cardiovascular;  Laterality: N/A;  . PERIPHERAL VASCULAR CATHETERIZATION Right 12/09/2014   Procedure: A/V Shuntogram/Fistulagram;  Surgeon: Algernon Huxley, MD;  Location: [facility];  Service: Cardiovascular;  Laterality: Right;  . PERIPHERAL VASCULAR CATHETERIZATION N/A 12/09/2014   Procedure: A/V Shunt Intervention;  Surgeon: Algernon Huxley, MD;  Location: [facility];  Service: Cardiovascular;  Laterality: N/A;  . PERIPHERAL VASCULAR CATHETERIZATION Right 05/24/2015   Procedure: A/V Shuntogram;  Surgeon: Serafina Mitchell, MD;  Location: [facility];  Service: Cardiovascular;  Laterality: Right;  . PERIPHERAL VASCULAR CATHETERIZATION Right 05/24/2015   Procedure: Peripheral Vascular Balloon Angioplasty;  Surgeon: Serafina Mitchell, MD;  Location: [facility];  Service: Cardiovascular;  Laterality: Right;  right arm shunt  . PERIPHERAL VASCULAR CATHETERIZATION N/A 06/13/2015   Procedure: A/V Shuntogram/Fistulagram;  Surgeon: Algernon Huxley, MD;  Location: [facility];  Service: Cardiovascular;  Laterality: N/A;  . PERIPHERAL VASCULAR CATHETERIZATION N/A 06/13/2015   Procedure: A/V Shunt Intervention;  Surgeon: Algernon Huxley, MD;  Location: [facility];  Service: Cardiovascular;  Laterality: N/A;  . TUBAL LIGATION  1983    Social History:  reports that she has never smoked. She has never used smokeless tobacco. She reports that she does not drink alcohol or  use drugs.   Family History  Problem Relation Age of Onset  . Diabetes Mother   . Hypertension Mother   . Heart disease Mother   . Alcohol abuse Mother   . Kidney disease Mother   . Cancer Father     COLON  . Alcohol abuse Father   . Breast cancer  Sister 57  . Hypertension Sister   . Kidney disease Sister   . Hypertension Son     Allergies  Allergen Reactions  . Contrast Media [Iodinated Diagnostic Agents] Anaphylaxis  . Ancef [Cefazolin Sodium] Nausea And Vomiting  . Naproxen Sodium Itching     Medications: I have reviewed patients current medications as documented in Epic Anti-infectives    Start     Dose/Rate Route Frequency Ordered Stop   02/17/16 1800  ceFAZolin (ANCEF) IVPB 2g/100 mL premix     2 g 200 mL/hr over 30 Minutes Intravenous Every M-W-F (1800) 02/16/16 1342     02/17/16 1200  vancomycin (VANCOCIN) IVPB 1000 mg/200 mL premix  Status:  Discontinued     1,000 mg 200 mL/hr over 60 Minutes Intravenous Every M-W-F (Hemodialysis) 02/16/16 0731 02/16/16 1317   02/17/16 1200  vancomycin (VANCOCIN) IVPB 1000 mg/200 mL premix  Status:  Discontinued     1,000 mg 200 mL/hr over 60 Minutes Intravenous On call to O.R. 02/16/16 3419 02/16/16 0857   02/17/16 1200  ceFAZolin (ANCEF) IVPB 2g/100 mL premix  Status:  Discontinued     2 g 200 mL/hr over 30 Minutes Intravenous Every M-W-F (Hemodialysis) 02/16/16 1324 02/16/16 1342   02/16/16 0800  ceFAZolin (ANCEF) IVPB 1 g/50 mL premix  Status:  Discontinued     1 g 100 mL/hr over 30 Minutes Intravenous Every 24 hours 02/16/16 0653 02/16/16 0724   02/16/16 0745  vancomycin (VANCOCIN) 2,000 mg in sodium chloride 0.9 % 500 mL IVPB     2,000 mg 250 mL/hr over 120 Minutes Intravenous  Once 02/16/16 0731 02/16/16 1052         ROS: as in HPI otherwise remainder of 12 point Review of Systems negative  Blood pressure (!) 121/35, pulse 99, temperature 99.3 F (37.4 C), temperature source Oral, resp. rate 17, height _0  (1.549 m), weight 209 lb 7 oz (95 kg), SpO2 99 %. General: Alert and awake, oriented x3, not in any acute distress. HEENT: anicteric sclera,  EOMI, oropharynx clear and without exudate Cardiovascular: tachy  rate, normal r, faint murmur Pulmonary: clear to  auscultation bilaterally, no wheezing, rales or rhonchi Gastrointestinal: soft nontender, nondistended, normal bowel sounds, Musculoskeletal: no  clubbing or edema noted bilaterally Skin, soft tissue:  AV graft site with bandage Neuro: nonfocal, strength and sensation intact   Results for orders placed or performed during the hospital encounter of 02/15/16 (from the past 48 hour(s))  CBC     Status: Abnormal   Collection Time: 02/15/16  8:22 AM  Result Value Ref Range   WBC 14.4 (H) 4.0 - 10.5 K/uL   RBC 2.82 (L) 3.87 - 5.11 MIL/uL   Hemoglobin 7.5 (L) 12.0 - 15.0 g/dL   HCT 23.7 (L) 36.0 - 46.0 %   MCV 84.0 78.0 - 100.0 fL   MCH 26.6 26.0 - 34.0 pg   MCHC 31.6 30.0 - 36.0 g/dL   RDW 19.4 (H) 11.5 - 15.5 %   Platelets 421 (H) 150 - 400 K/uL  Comprehensive metabolic panel     Status: Abnormal   Collection Time: 02/15/16  8:22 AM  Result Value Ref Range   Sodium 137 135 - 145 mmol/L   Potassium 2.8 (L) 3.5 - 5.1 mmol/L   Chloride 92 (L) 101 - 111 mmol/L   CO2 28 22 - 32 mmol/L   Glucose, Bld 102 (H) 65 - 99 mg/dL   BUN 18 6 - 20 mg/dL   Creatinine, Ser 7.08 (H) 0.44 - 1.00 mg/dL   Calcium 10.0 8.9 - 10.3 mg/dL   Total Protein 6.8 6.5 - 8.1 g/dL   Albumin 2.6 (L) 3.5 - 5.0 g/dL   AST 23 15 - 41 U/L   ALT 15 14 - 54 U/L   Alkaline Phosphatase 116 38 - 126 U/L   Total Bilirubin 1.2 0.3 - 1.2 mg/dL   GFR calc non Af Amer 6 (L) >60 mL/min   GFR calc Af Amer 6 (L) >60 mL/min    Comment: (NOTE) The eGFR has been calculated using the CKD EPI equation. This calculation has not been validated in all clinical situations. eGFR's persistently <60 mL/min signify possible Chronic Kidney Disease.    Anion gap 17 (H) 5 - 15  Protime-INR     Status: None   Collection Time: 02/15/16  8:22 AM  Result Value Ref Range   Prothrombin Time 14.9 11.4 - 15.2 seconds   INR 1.16   Type and screen     Status: None   Collection Time: 02/15/16  8:27 AM  Result Value Ref Range   ABO/RH(D) A POS      Antibody Screen NEG    Sample Expiration 02/18/2016    Unit Number [id number]    Blood Component Type RBC LR PHER1    Unit division 00    Status of Unit ISSUED,FINAL    Transfusion Status OK TO TRANSFUSE    Crossmatch Result Compatible    Unit Number [id number]    Blood Component Type RBC LR PHER2    Unit division 00    Status of Unit ISSUED,FINAL    Transfusion Status OK TO TRANSFUSE    Crossmatch Result Compatible   ABO/Rh     Status: None   Collection Time: 02/15/16  8:27 AM  Result Value Ref Range   ABO/RH(D) A POS   CBG monitoring, ED     Status: None   Collection Time: 02/15/16  9:23 AM  Result Value Ref Range   Glucose-Capillary 94 65 - 99 mg/dL  Culture, blood (Routine X 2) w Reflex to ID Panel     Status: None (Preliminary result)   Collection Time: 02/15/16 10:20 AM  Result Value Ref Range   Specimen Description BLOOD LEFT HAND    Special Requests IN PEDIATRIC BOTTLE  2CC    Culture  Setup Time      GRAM POSITIVE COCCI IN CLUSTERS IN PEDIATRIC BOTTLE CRITICAL VALUE NOTED.  VALUE IS CONSISTENT WITH PREVIOUSLY REPORTED AND CALLED VALUE.    Culture TOO YOUNG TO READ    Report Status PENDING   Culture, blood (Routine X 2) w Reflex to ID Panel     Status: None (Preliminary result)   Collection Time: 02/15/16 10:27 AM  Result Value Ref Range   Specimen Description BLOOD LEFT HAND    Special Requests IN PEDIATRIC BOTTLE  2CC    Culture  Setup Time      GRAM POSITIVE COCCI IN CLUSTERS IN PEDIATRIC BOTTLE Organism ID to follow CRITICAL RESULT CALLED TO, READ BACK BY AND VERIFIED WITH: VERONDA BRYK,PHARMD _0  02/16/16 MKELLY,MLT    Culture TOO YOUNG  TO READ    Report Status PENDING   Blood Culture ID Panel (Reflexed)     Status: Abnormal   Collection Time: 02/15/16 10:27 AM  Result Value Ref Range   Enterococcus species NOT DETECTED NOT DETECTED   Listeria monocytogenes NOT DETECTED NOT DETECTED   Staphylococcus species DETECTED (A) NOT DETECTED     Comment: CRITICAL RESULT CALLED TO, READ BACK BY AND VERIFIED WITH: V BRYK, PHARMD _0  02/16/16 MKELLY,MLT    Staphylococcus aureus NOT DETECTED NOT DETECTED   Methicillin resistance NOT DETECTED NOT DETECTED   Streptococcus species NOT DETECTED NOT DETECTED   Streptococcus agalactiae NOT DETECTED NOT DETECTED   Streptococcus pneumoniae NOT DETECTED NOT DETECTED   Streptococcus pyogenes NOT DETECTED NOT DETECTED   Acinetobacter baumannii NOT DETECTED NOT DETECTED   Enterobacteriaceae species NOT DETECTED NOT DETECTED   Enterobacter cloacae complex NOT DETECTED NOT DETECTED   Escherichia coli NOT DETECTED NOT DETECTED   Klebsiella oxytoca NOT DETECTED NOT DETECTED   Klebsiella pneumoniae NOT DETECTED NOT DETECTED   Proteus species NOT DETECTED NOT DETECTED   Serratia marcescens NOT DETECTED NOT DETECTED   Haemophilus influenzae NOT DETECTED NOT DETECTED   Neisseria meningitidis NOT DETECTED NOT DETECTED   Pseudomonas aeruginosa NOT DETECTED NOT DETECTED   Candida albicans NOT DETECTED NOT DETECTED   Candida glabrata NOT DETECTED NOT DETECTED   Candida krusei NOT DETECTED NOT DETECTED   Candida parapsilosis NOT DETECTED NOT DETECTED   Candida tropicalis NOT DETECTED NOT DETECTED  I-Stat CG4 Lactic Acid, ED     Status: Abnormal   Collection Time: 02/15/16 11:07 AM  Result Value Ref Range   Lactic Acid, Venous 2.06 (HH) 0.5 - 1.9 mmol/L   Comment NOTIFIED PHYSICIAN   Ethanol     Status: None   Collection Time: 02/15/16 11:30 AM  Result Value Ref Range   Alcohol, Ethyl (B) <5 <5 mg/dL    Comment:        LOWEST DETECTABLE LIMIT FOR SERUM ALCOHOL IS 5 mg/dL FOR MEDICAL PURPOSES ONLY   Ammonia     Status: None   Collection Time: 02/15/16 11:30 AM  Result Value Ref Range   Ammonia 12 9 - 35 umol/L  Lactic acid, plasma     Status: None   Collection Time: 02/15/16  6:08 PM  Result Value Ref Range   Lactic Acid, Venous 1.3 0.5 - 1.9 mmol/L  CDS serology     Status: None    Collection Time: 02/15/16  6:12 PM  Result Value Ref Range   CDS serology specimen      SPECIMEN WILL BE HELD FOR 14 DAYS IF TESTING IS REQUIRED  CBC     Status: Abnormal   Collection Time: 02/15/16  6:12 PM  Result Value Ref Range   WBC 13.3 (H) 4.0 - 10.5 K/uL   RBC 2.58 (L) 3.87 - 5.11 MIL/uL   Hemoglobin 6.7 (LL) 12.0 - 15.0 g/dL    Comment: REPEATED TO VERIFY CRITICAL RESULT CALLED TO, READ BACK BY AND VERIFIED WITH: C. HOLDSON RN [id number] GREEN R    HCT 21.6 (L) 36.0 - 46.0 %   MCV 83.7 78.0 - 100.0 fL   MCH 26.0 26.0 - 34.0 pg   MCHC 31.0 30.0 - 36.0 g/dL   RDW 19.6 (H) 11.5 - 15.5 %   Platelets 402 (H) 150 - 400 K/uL  Creatinine, serum     Status: Abnormal   Collection Time: 02/15/16  6:12 PM  Result Value Ref  Range   Creatinine, Ser 7.90 (H) 0.44 - 1.00 mg/dL   GFR calc non Af Amer 5 (L) >60 mL/min   GFR calc Af Amer 6 (L) >60 mL/min    Comment: (NOTE) The eGFR has been calculated using the CKD EPI equation. This calculation has not been validated in all clinical situations. eGFR's persistently <60 mL/min signify possible Chronic Kidney Disease.   Blood gas, arterial     Status: Abnormal   Collection Time: 02/15/16  7:02 PM  Result Value Ref Range   FIO2 32.00    Delivery systems NASAL CANNULA    pH, Arterial 7.547 (H) 7.350 - 7.450   pCO2 arterial 37.0 32.0 - 48.0 mmHg   pO2, Arterial 163 (H) 83.0 - 108.0 mmHg   Bicarbonate 32.1 (H) 20.0 - 28.0 mmol/L   Acid-Base Excess 8.9 (H) 0.0 - 2.0 mmol/L   O2 Saturation 99.6 %   Patient temperature 98.6    Collection site LEFT RADIAL    Drawn by [id number]    Sample type ARTERIAL DRAW    Allens test (pass/fail) PASS PASS  Prepare RBC     Status: None   Collection Time: 02/15/16  7:30 PM  Result Value Ref Range   Order Confirmation ORDER PROCESSED BY BLOOD BANK   CBC     Status: Abnormal   Collection Time: 02/16/16  2:12 AM  Result Value Ref Range   WBC 15.8 (H) 4.0 - 10.5 K/uL   RBC 2.78 (L) 3.87 - 5.11 MIL/uL     Hemoglobin 7.5 (L) 12.0 - 15.0 g/dL   HCT 23.2 (L) 36.0 - 46.0 %   MCV 83.5 78.0 - 100.0 fL   MCH 27.0 26.0 - 34.0 pg   MCHC 32.3 30.0 - 36.0 g/dL   RDW 18.6 (H) 11.5 - 15.5 %   Platelets 300 150 - 400 K/uL  Basic metabolic panel     Status: Abnormal   Collection Time: 02/16/16  2:12 AM  Result Value Ref Range   Sodium 137 135 - 145 mmol/L   Potassium 3.2 (L) 3.5 - 5.1 mmol/L   Chloride 98 (L) 101 - 111 mmol/L   CO2 28 22 - 32 mmol/L   Glucose, Bld 96 65 - 99 mg/dL   BUN 16 6 - 20 mg/dL   Creatinine, Ser 5.63 (H) 0.44 - 1.00 mg/dL   Calcium 8.9 8.9 - 10.3 mg/dL   GFR calc non Af Amer 7 (L) >60 mL/min   GFR calc Af Amer 9 (L) >60 mL/min    Comment: (NOTE) The eGFR has been calculated using the CKD EPI equation. This calculation has not been validated in all clinical situations. eGFR's persistently <60 mL/min signify possible Chronic Kidney Disease.    Anion gap 11 5 - 15  Ferritin     Status: Abnormal   Collection Time: 02/16/16 12:13 PM  Result Value Ref Range   Ferritin 942 (H) 11 - 307 ng/mL  Iron and TIBC     Status: Abnormal   Collection Time: 02/16/16 12:13 PM  Result Value Ref Range   Iron 16 (L) 28 - 170 ug/dL   TIBC 99 (L) 250 - 450 ug/dL   Saturation Ratios 16 10.4 - 31.8 %   UIBC 83 ug/dL  Surgical pcr screen     Status: None   Collection Time: 02/16/16  1:30 PM  Result Value Ref Range   MRSA, PCR NEGATIVE NEGATIVE   Staphylococcus aureus NEGATIVE NEGATIVE    Comment:  The Xpert SA Assay (FDA approved for NASAL specimens in patients over 37 years of age), is one component of a comprehensive surveillance program.  Test performance has been validated by [facility] for patients greater than or equal to 53 year old. It is not intended to diagnose infection nor to guide or monitor treatment.    _0 (sdes,specrequest,cult,reptstatus)   ) Recent Results (from the past 720 hour(s))  Culture, blood (Routine X 2) w Reflex to ID Panel      Status: None (Preliminary result)   Collection Time: 02/15/16 10:20 AM  Result Value Ref Range Status   Specimen Description BLOOD LEFT HAND  Final   Special Requests IN PEDIATRIC BOTTLE  2CC  Final   Culture  Setup Time   Final    GRAM POSITIVE COCCI IN CLUSTERS IN PEDIATRIC BOTTLE CRITICAL VALUE NOTED.  VALUE IS CONSISTENT WITH PREVIOUSLY REPORTED AND CALLED VALUE.    Culture TOO YOUNG TO READ  Final   Report Status PENDING  Incomplete  Culture, blood (Routine X 2) w Reflex to ID Panel     Status: None (Preliminary result)   Collection Time: 02/15/16 10:27 AM  Result Value Ref Range Status   Specimen Description BLOOD LEFT HAND  Final   Special Requests IN PEDIATRIC BOTTLE  2CC  Final   Culture  Setup Time   Final    GRAM POSITIVE COCCI IN CLUSTERS IN PEDIATRIC BOTTLE Organism ID to follow CRITICAL RESULT CALLED TO, READ BACK BY AND VERIFIED WITH: VERONDA BRYK,PHARMD _1  02/16/16 MKELLY,MLT    Culture TOO YOUNG TO READ  Final   Report Status PENDING  Incomplete  Blood Culture ID Panel (Reflexed)     Status: Abnormal   Collection Time: 02/15/16 10:27 AM  Result Value Ref Range Status   Enterococcus species NOT DETECTED NOT DETECTED Final   Listeria monocytogenes NOT DETECTED NOT DETECTED Final   Staphylococcus species DETECTED (A) NOT DETECTED Final    Comment: CRITICAL RESULT CALLED TO, READ BACK BY AND VERIFIED WITH: V BRYK, PHARMD _2  02/16/16 MKELLY,MLT    Staphylococcus aureus NOT DETECTED NOT DETECTED Final   Methicillin resistance NOT DETECTED NOT DETECTED Final   Streptococcus species NOT DETECTED NOT DETECTED Final   Streptococcus agalactiae NOT DETECTED NOT DETECTED Final   Streptococcus pneumoniae NOT DETECTED NOT DETECTED Final   Streptococcus pyogenes NOT DETECTED NOT DETECTED Final   Acinetobacter baumannii NOT DETECTED NOT DETECTED Final   Enterobacteriaceae species NOT DETECTED NOT DETECTED Final   Enterobacter cloacae complex NOT DETECTED NOT DETECTED  Final   Escherichia coli NOT DETECTED NOT DETECTED Final   Klebsiella oxytoca NOT DETECTED NOT DETECTED Final   Klebsiella pneumoniae NOT DETECTED NOT DETECTED Final   Proteus species NOT DETECTED NOT DETECTED Final   Serratia marcescens NOT DETECTED NOT DETECTED Final   Haemophilus influenzae NOT DETECTED NOT DETECTED Final   Neisseria meningitidis NOT DETECTED NOT DETECTED Final   Pseudomonas aeruginosa NOT DETECTED NOT DETECTED Final   Candida albicans NOT DETECTED NOT DETECTED Final   Candida glabrata NOT DETECTED NOT DETECTED Final   Candida krusei NOT DETECTED NOT DETECTED Final   Candida parapsilosis NOT DETECTED NOT DETECTED Final   Candida tropicalis NOT DETECTED NOT DETECTED Final  Surgical pcr screen     Status: None   Collection Time: 02/16/16  1:30 PM  Result Value Ref Range Status   MRSA, PCR NEGATIVE NEGATIVE Final   Staphylococcus aureus NEGATIVE NEGATIVE Final    Comment:  The Xpert SA Assay (FDA approved for NASAL specimens in patients over 3 years of age), is one component of a comprehensive surveillance program.  Test performance has been validated by [facility] for patients greater than or equal to 25 year old. It is not intended to diagnose infection nor to guide or monitor treatment.      Impression/Recommendation  Active Problems:   Altered mental status   Anemia   Ioma A Marks is a 60 y.o. 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 It may be difficult to time that to ensure that she is clear to bacteremia prior to placement of a catheter  Would repeat blood cultures today and tomorrow AFTER graft surgery  She will need a 6 week course of IV ancef with HD once once cultures have cleared.  I will also add rifampin once blood cultures are clear given Likelihood that prosthetic material still will be present in the remaining graft.  02/16/2016, 3:45 PM   Thank you so much for this interesting consult  [place] [facility] [place] [phone] (pager) [phone] (office) 02/16/2016, 3:45 PM  Rhina Br[place] 02/16/2016, 3:45 PM   \c

## 2016-02-16 NOTE — Progress Notes (Signed)
PHARMACY - PHYSICIAN COMMUNICATION CRITICAL VALUE ALERT - BLOOD CULTURE IDENTIFICATION (BCID)  Results for orders placed or performed during the hospital encounter of 02/15/16  Blood Culture ID Panel (Reflexed) (Collected: 02/15/2016 10:27 AM)  Result Value Ref Range   Enterococcus species NOT DETECTED NOT DETECTED   Listeria monocytogenes NOT DETECTED NOT DETECTED   Staphylococcus species DETECTED (A) NOT DETECTED   Staphylococcus aureus NOT DETECTED NOT DETECTED   Methicillin resistance NOT DETECTED NOT DETECTED   Streptococcus species NOT DETECTED NOT DETECTED   Streptococcus agalactiae NOT DETECTED NOT DETECTED   Streptococcus pneumoniae NOT DETECTED NOT DETECTED   Streptococcus pyogenes NOT DETECTED NOT DETECTED   Acinetobacter baumannii NOT DETECTED NOT DETECTED   Enterobacteriaceae species NOT DETECTED NOT DETECTED   Enterobacter cloacae complex NOT DETECTED NOT DETECTED   Escherichia coli NOT DETECTED NOT DETECTED   Klebsiella oxytoca NOT DETECTED NOT DETECTED   Klebsiella pneumoniae NOT DETECTED NOT DETECTED   Proteus species NOT DETECTED NOT DETECTED   Serratia marcescens NOT DETECTED NOT DETECTED   Haemophilus influenzae NOT DETECTED NOT DETECTED   Neisseria meningitidis NOT DETECTED NOT DETECTED   Pseudomonas aeruginosa NOT DETECTED NOT DETECTED   Candida albicans NOT DETECTED NOT DETECTED   Candida glabrata NOT DETECTED NOT DETECTED   Candida krusei NOT DETECTED NOT DETECTED   Candida parapsilosis NOT DETECTED NOT DETECTED   Candida tropicalis NOT DETECTED NOT DETECTED    Name of physician (or Provider) Contacted: A Riccio  Changes to prescribed antibiotics required: Start Ancef 2/2 recent AV fistula infection; discussed intolerance (N/V), agreed to start and monitor.  Wynona Neat, PharmD, BCPS  02/16/2016  6:55 AM

## 2016-02-16 NOTE — Evaluation (Signed)
Speech Language Pathology Evaluation Patient Details Name: [name] Marks MRN: [id number] DOB: 04-02-1955 Today's Date: 02/16/2016 Time: 1400-1420 SLP Time Calculation (min) (ACUTE ONLY): 20 min  Problem List:  Patient Active Problem List   Diagnosis Date Noted  . Anemia   . Altered mental status 02/15/2016  . Renal dialysis device, implant, or graft complication 62/70/3500  . Pain and swelling of right upper extremity 12/20/2015  . Complex endometrial hyperplasia with atypia 11/08/2015  . Increased endometrial stripe thickness 11/03/2015  . Obesity 10/28/2014  . Shoulder pain, bilateral 10/28/2014  . Health care maintenance 10/28/2014  . Hypotension, unspecified 01/14/2013  . Right carotid bruit 01/08/2013  . Generalized headaches 01/08/2013  . Chronic female pelvic pain 08/08/2012  . Gout, unspecified 12/06/2008  . Esophageal reflux 12/06/2008  . BACK PAIN 12/06/2008  . FIBROIDS, UTERUS 11/24/2008  . RENAL DISEASE, END STAGE 07/01/2006   Past Medical History:  Past Medical History:  Diagnosis Date  . Complication of anesthesia    due to kidney disease  . End stage renal disease on dialysis ([place])   . GERD (gastroesophageal reflux disease)   . Gout    Past Surgical History:  Past Surgical History:  Procedure Laterality Date  . DG AV DIALYSIS GRAFT DECLOT OR    . DILATATION & CURETTAGE/HYSTEROSCOPY WITH TRUECLEAR N/A 11/06/2012   Procedure: DILATATION & CURETTAGE/HYSTEROSCOPY WITH TRUECLEAR;  Surgeon: Terrance Mass, MD;  Location: [facility];  Service: Gynecology;  Laterality: N/A;  Truclear Resectoscopic Polypectomy   . PERIPHERAL VASCULAR CATHETERIZATION N/A 09/14/2014   Procedure: A/V Shuntogram/Fistulagram;  Surgeon: Katha Cabal, MD;  Location: [facility];  Service: Cardiovascular;  Laterality: N/A;  . PERIPHERAL VASCULAR CATHETERIZATION N/A 09/14/2014   Procedure: A/V Shunt Intervention;  Surgeon: Katha Cabal, MD;  Location: [facility];   Service: Cardiovascular;  Laterality: N/A;  . PERIPHERAL VASCULAR CATHETERIZATION Right 12/09/2014   Procedure: A/V Shuntogram/Fistulagram;  Surgeon: Algernon Huxley, MD;  Location: [facility];  Service: Cardiovascular;  Laterality: Right;  . PERIPHERAL VASCULAR CATHETERIZATION N/A 12/09/2014   Procedure: A/V Shunt Intervention;  Surgeon: Algernon Huxley, MD;  Location: [facility];  Service: Cardiovascular;  Laterality: N/A;  . PERIPHERAL VASCULAR CATHETERIZATION Right 05/24/2015   Procedure: A/V Shuntogram;  Surgeon: Serafina Mitchell, MD;  Location: [facility];  Service: Cardiovascular;  Laterality: Right;  . PERIPHERAL VASCULAR CATHETERIZATION Right 05/24/2015   Procedure: Peripheral Vascular Balloon Angioplasty;  Surgeon: Serafina Mitchell, MD;  Location: [facility];  Service: Cardiovascular;  Laterality: Right;  right arm shunt  . PERIPHERAL VASCULAR CATHETERIZATION N/A 06/13/2015   Procedure: A/V Shuntogram/Fistulagram;  Surgeon: Algernon Huxley, MD;  Location: [facility];  Service: Cardiovascular;  Laterality: N/A;  . PERIPHERAL VASCULAR CATHETERIZATION N/A 06/13/2015   Procedure: A/V Shunt Intervention;  Surgeon: Algernon Huxley, MD;  Location: [facility];  Service: Cardiovascular;  Laterality: N/A;  . TUBAL LIGATION  1983   HPI:  [name] Marks is a 60 y.o. female admitted on 02/15/2016 with AMS, now w/ staph spp growing in blood cx s/p tx for AV fistula infection.  Pt has hx of ESRD on dialysis, GERD. CXR 02/15/16 showed no edema or consolidation. Mild cardiac prominence. Aortic   Assessment / Plan / Recommendation Clinical Impression  Patient presents with oropharyngeal swallow which appears clinically within functional limits. No signs of aspiration observed. Recommend regular diet with thin liquids, medications whole with liquid.  Adhere to general aspiration precautions: upright for PO intake, feed only when alert, oral care BID. No further SLP  services recommended at this time.    SLP Assessment       Follow Up Recommendations  Skilled Nursing facility    Frequency and Duration           SLP Evaluation Cognition          Comprehension       Expression     Oral / Motor  Oral Motor/Sensory Function Overall Oral Motor/Sensory Function: Within functional limits   GO                   Deneise Lever, MS CF-SLP Speech-Language Pathologist  Aliene Altes 02/16/2016, 2:29 PM

## 2016-02-16 NOTE — Progress Notes (Signed)
Pharmacy Antibiotic Note  [name] Marks is a 60 y.o. female admitted on 02/15/2016 with AMS, now w/ staph spp growing in blood cx s/p tx for AV fistula infection.  Pharmacy has been consulted for vancomycin dosing.  Plan: Vancomycin 2000mg  IV x1 followed by 1000mg  after each HD.  Height: 5\' 1"  (154.9 cm) Weight: 209 lb 7 oz (95 kg) IBW/kg (Calculated) : 47.8  Temp (24hrs), Avg:98.4 F (36.9 C), Min:97.6 F (36.4 C), Max:99.5 F (37.5 C)   Recent Labs Lab 02/15/16 0822 02/15/16 1107 02/15/16 1808 02/15/16 1812 02/16/16 0212  WBC 14.4*  --   --  13.3* 15.8*  CREATININE 7.08*  --   --  7.90* 5.63*  LATICACIDVEN  --  2.06* 1.3  --   --     Estimated Creatinine Clearance: 11 mL/min (by C-G formula based on SCr of 5.63 mg/dL (H)).    Allergies  Allergen Reactions  . Contrast Media [Iodinated Diagnostic Agents] Anaphylaxis  . Ancef [Cefazolin Sodium] Nausea And Vomiting  . Naproxen Sodium Itching     Thank you for allowing pharmacy to be a part of this patient's care.  Wynona Neat, PharmD, BCPS  02/16/2016 7:29 AM

## 2016-02-16 NOTE — Progress Notes (Addendum)
Pharmacy Antibiotic Note  [name] Marks is a 60 y.o. female admitted on 02/15/2016 with bacteremia and AV fistula graft infection.  Pharmacy has been consulted for cefazolin dosing. Patient received one loading dose of Vancomycin today 12/21 and is being narrowed due to no detection of methicillin-resistance on BCID. Patient is afebrile with WBC count 15.8. She has ESRD with HD MWF. The patient is planned for surgery tomorrow to remove the infected graft.   Plan: Cefazolin 2 gm after HD on MWF Follow-up cultures and length of therapy Monitor clinical signs and symptoms of infection  Height: 5\' 1"  (154.9 cm) Weight: 209 lb 7 oz (95 kg) IBW/kg (Calculated) : 47.8  Temp (24hrs), Avg:98.4 F (36.9 C), Min:97.6 F (36.4 C), Max:99.5 F (37.5 C)   Recent Labs Lab 02/15/16 0822 02/15/16 1107 02/15/16 1808 02/15/16 1812 02/16/16 0212  WBC 14.4*  --   --  13.3* 15.8*  CREATININE 7.08*  --   --  7.90* 5.63*  LATICACIDVEN  --  2.06* 1.3  --   --     Estimated Creatinine Clearance: 11 mL/min (by C-G formula based on SCr of 5.63 mg/dL (H)).    Allergies  Allergen Reactions  . Contrast Media [Iodinated Diagnostic Agents] Anaphylaxis  . Ancef [Cefazolin Sodium] Nausea And Vomiting  . Naproxen Sodium Itching    Antimicrobials this admission: 12/21 Vanc x 1 dose  12/21 Cefazolin>>  Microbiology results: 12/20 BCx:Gm positive cocci in clusters 2/2 ( BCID: Staphylococcus species; No MecA deteected)  Thank you for allowing pharmacy to be a part of this patient's care.  Ihor Austin, PharmD PGY1 Pharmacy Resident Pager: [phone]  02/16/2016 1:24 PM
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
## 2016-02-16 NOTE — Evaluation (Signed)
Physical Therapy Evaluation Patient Details Name: [name] Marks MRN: [id number] DOB: [birth year] Today's Date: 02/16/2016   History of Present Illness  [name] Marks is a 60 y.o. female presenting with AMS . PMH is significant for ESRD on HD, complex atypical endometrial hyperplasia  Clinical Impression   Pt admitted with/for AMS due to infected HD graft.  Pt is at min assist level and presently not making safe decisions..  Pt currently limited functionally due to the problems listed. ( See problems list.)   Pt will benefit from PT to maximize function and safety in order to get ready for next venue listed below.      Follow Up Recommendations SNF    Equipment Recommendations  None recommended by PT    Recommendations for Other Services       Precautions / Restrictions Precautions Precautions: Fall      Mobility  Bed Mobility Overal bed mobility: Needs Assistance Bed Mobility: Sidelying to Sit;Rolling Rolling: Min assist Sidelying to sit: Min assist       General bed mobility comments: cues for technique, truncal assist  Transfers Overall transfer level: Needs assistance   Transfers: Sit to/from Stand Sit to Stand: Min assist         General transfer comment: cues for hand placement and minor lifiting assist  Ambulation/Gait Ambulation/Gait assistance: Min guard Ambulation Distance (Feet): 40 Feet (then 15 feet with RW) Assistive device: Rolling walker (2 wheeled) Gait Pattern/deviations: Step-through pattern     General Gait Details: mildly unsteady overall.  Guard due to pt not making sound decisions.  Not using RW correctly  Stairs            Wheelchair Mobility    Modified Rankin (Stroke Patients Only)       Balance     Sitting balance-Leahy Scale: Good       Standing balance-Leahy Scale: Fair                               Pertinent Vitals/Pain Pain Assessment: Faces Faces Pain Scale: Hurts little more Pain  Location: back Pain Descriptors / Indicators: Aching Pain Intervention(s): Monitored during session;Repositioned    Home Living Family/patient expects to be discharged to:: Private residence Living Arrangements: Alone Available Help at Discharge: Available PRN/intermittently Type of Home: House Home Access: Stairs to enter Entrance Stairs-Rails: Psychiatric nurse of Steps: 3 Home Layout: One level Home Equipment: Environmental consultant - standard      Prior Function Level of Independence: Independent with assistive device(s)   Gait / Transfers Assistance Needed: pt reports that she uses walker most of the time  ADL's / Homemaking Assistance Needed: was independent with ADLs        Hand Dominance   Dominant Hand: Left    Extremity/Trunk Assessment   Upper Extremity Assessment Upper Extremity Assessment: Defer to OT evaluation    Lower Extremity Assessment Lower Extremity Assessment: Overall WFL for tasks assessed;Generalized weakness (grossly 4/5)       Communication   Communication: No difficulties  Cognition Arousal/Alertness: Awake/alert Behavior During Therapy: WFL for tasks assessed/performed Overall Cognitive Status: Within Functional Limits for tasks assessed (but not at baseline, )                 General Comments: pt not making sound safety decisions, not focused    General Comments      Exercises     Assessment/Plan    PT Assessment  Patient needs continued PT services  PT Problem List Decreased strength;Decreased activity tolerance;Decreased balance;Decreased mobility;Decreased cognition;Decreased knowledge of use of DME;Pain          PT Treatment Interventions DME instruction;Gait training;Functional mobility training;Therapeutic activities;Balance training;Patient/family education    PT Goals (Current goals can be found in the Care Plan section)  Acute Rehab PT Goals Patient Stated Goal: go home PT Goal Formulation: With  patient Time For Goal Achievement: 02/23/16 Potential to Achieve Goals: Good    Frequency Min 3X/week   Barriers to discharge Decreased caregiver support      Co-evaluation               End of Session   Activity Tolerance: Patient tolerated treatment well;No increased pain Patient left: in chair;with call bell/phone within reach;with chair alarm set Nurse Communication: Mobility status         Time: 3810-1751 PT Time Calculation (min) (ACUTE ONLY): 36 min   Charges:   PT Evaluation $PT Eval Moderate Complexity: 1 Procedure PT Treatments $Gait Training: 8-22 mins   PT G CodesTessie Fass Jonel Sick 02/16/2016, 5:03 PM 02/16/2016  Donnella Sham, PT [phone]  (pager)
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
## 2016-02-17 ENCOUNTER — Encounter (HOSPITAL_COMMUNITY)
Admission: EM | Disposition: A | Discharge status: Skilled Nursing Facility | Payer: Self-pay | Source: Home / Self Care | Attending: Family Medicine

## 2016-02-17 ENCOUNTER — Inpatient Hospital Stay (HOSPITAL_COMMUNITY): Payer: Medicare Other | Admitting: Certified Registered Nurse Anesthetist

## 2016-02-17 ENCOUNTER — Other Ambulatory Visit (HOSPITAL_COMMUNITY): Payer: Medicare Other

## 2016-02-17 ENCOUNTER — Inpatient Hospital Stay (HOSPITAL_COMMUNITY): Payer: Medicare Other

## 2016-02-17 ENCOUNTER — Encounter (HOSPITAL_COMMUNITY): Payer: Self-pay

## 2016-02-17 DIAGNOSIS — Z992 Dependence on renal dialysis: Secondary | ICD-10-CM

## 2016-02-17 DIAGNOSIS — N186 End stage renal disease: Secondary | ICD-10-CM

## 2016-02-17 DIAGNOSIS — T827XXA Infection and inflammatory reaction due to other cardiac and vascular devices, implants and grafts, initial encounter: Principal | ICD-10-CM

## 2016-02-17 DIAGNOSIS — R7881 Bacteremia: Secondary | ICD-10-CM

## 2016-02-17 HISTORY — PX: PATCH ANGIOPLASTY: SHX6230

## 2016-02-17 HISTORY — PX: AVGG REMOVAL: SHX5153

## 2016-02-17 LAB — CBC
HCT: 29.1 % — ABNORMAL LOW (ref 36.0–46.0)
HCT: 27.1 % — ABNORMAL LOW (ref 36.0–46.0)
HEMOGLOBIN: 9.6 g/dL — AB (ref 12.0–15.0)
HEMOGLOBIN: 9.2 g/dL — AB (ref 12.0–15.0)
MCH: 26.7 pg (ref 26.0–34.0)
MCH: 27.3 pg (ref 26.0–34.0)
MCHC: 33 g/dL (ref 30.0–36.0)
MCHC: 33.9 g/dL (ref 30.0–36.0)
MCV: 81.1 fL (ref 78.0–100.0)
MCV: 80.4 fL (ref 78.0–100.0)
Platelets: 260 10*3/uL (ref 150–400)
Platelets: 219 10*3/uL (ref 150–400)
RBC: 3.59 MIL/uL — ABNORMAL LOW (ref 3.87–5.11)
RBC: 3.37 MIL/uL — AB (ref 3.87–5.11)
RDW: 18.4 % — AB (ref 11.5–15.5)
RDW: 18.7 % — ABNORMAL HIGH (ref 11.5–15.5)
WBC: 16.2 10*3/uL — ABNORMAL HIGH (ref 4.0–10.5)
WBC: 15.9 10*3/uL — ABNORMAL HIGH (ref 4.0–10.5)

## 2016-02-17 LAB — TYPE AND SCREEN
ABO/RH(D): A POS
Antibody Screen: NEGATIVE
UNIT DIVISION: 0
UNIT DIVISION: 0
Unit division: 0
Unit division: 0

## 2016-02-17 LAB — BASIC METABOLIC PANEL
ANION GAP: 15 (ref 5–15)
BUN: 24 mg/dL — AB (ref 6–20)
CALCIUM: 9.5 mg/dL (ref 8.9–10.3)
CO2: 23 mmol/L (ref 22–32)
CREATININE: 7.33 mg/dL — AB (ref 0.44–1.00)
Chloride: 98 mmol/L — ABNORMAL LOW (ref 101–111)
GFR calc Af Amer: 6 mL/min — ABNORMAL LOW (ref >60)
GFR, EST NON AFRICAN AMERICAN: 5 mL/min — AB (ref >60)
GLUCOSE: 104 mg/dL — AB (ref 65–99)
Potassium: 3.7 mmol/L (ref 3.5–5.1)
Sodium: 136 mmol/L (ref 135–145)

## 2016-02-17 LAB — HIV ANTIBODY (ROUTINE TESTING W REFLEX): HIV SCREEN 4TH GENERATION: NONREACTIVE

## 2016-02-17 LAB — C-REACTIVE PROTEIN: CRP: 27.3 mg/dL — ABNORMAL HIGH (ref <1.0)

## 2016-02-17 LAB — SEDIMENTATION RATE: Sed Rate: 47 mm/hr — ABNORMAL HIGH (ref 0–22)

## 2016-02-17 SURGERY — REMOVAL OF ARTERIOVENOUS GORETEX GRAFT (AVGG)
Anesthesia: General | Site: Arm Upper | Laterality: Right

## 2016-02-17 SURGERY — ECHOCARDIOGRAM, TRANSESOPHAGEAL
Anesthesia: Moderate Sedation

## 2016-02-17 MED ORDER — PROTAMINE SULFATE 10 MG/ML IV SOLN
INTRAVENOUS | Status: DC | PRN
  Administered 2016-02-17 (×2): 20 mg via INTRAVENOUS

## 2016-02-17 MED ORDER — FENTANYL CITRATE (PF) 100 MCG/2ML IJ SOLN
INTRAMUSCULAR | Status: AC
  Filled 2016-02-17: qty 2

## 2016-02-17 MED ORDER — LIDOCAINE HCL (CARDIAC) 20 MG/ML IV SOLN
INTRAVENOUS | Status: DC | PRN
  Administered 2016-02-17: 60 mg via INTRAVENOUS

## 2016-02-17 MED ORDER — DEXAMETHASONE SODIUM PHOSPHATE 4 MG/ML IJ SOLN
INTRAMUSCULAR | Status: DC | PRN
  Administered 2016-02-17: 5 mg via INTRAVENOUS

## 2016-02-17 MED ORDER — FENTANYL CITRATE (PF) 100 MCG/2ML IJ SOLN: 25.0000 ug | INTRAMUSCULAR | Status: DC | PRN

## 2016-02-17 MED ORDER — VANCOMYCIN HCL 1000 MG IV SOLR
INTRAVENOUS | Status: DC | PRN
  Administered 2016-02-17: 1000 mg via INTRAVENOUS

## 2016-02-17 MED ORDER — PHENYLEPHRINE HCL 10 MG/ML IJ SOLN
INTRAMUSCULAR | Status: DC | PRN
  Administered 2016-02-17: 80 ug via INTRAVENOUS
  Administered 2016-02-17: 120 ug via INTRAVENOUS

## 2016-02-17 MED ORDER — FENTANYL CITRATE (PF) 100 MCG/2ML IJ SOLN
INTRAMUSCULAR | Status: DC | PRN
  Administered 2016-02-17: 100 ug via INTRAVENOUS
  Administered 2016-02-17 (×3): 50 ug via INTRAVENOUS
  Administered 2016-02-17: 100 ug via INTRAVENOUS

## 2016-02-17 MED ORDER — FENTANYL CITRATE (PF) 100 MCG/2ML IJ SOLN
INTRAMUSCULAR | Status: AC
Start: 2016-02-17 — End: 2016-02-17
  Filled 2016-02-17: qty 2

## 2016-02-17 MED ORDER — ONDANSETRON HCL 4 MG/2ML IJ SOLN: 4.0000 mg | Freq: Four times a day (QID) | INTRAMUSCULAR | Status: DC | PRN

## 2016-02-17 MED ORDER — EPHEDRINE 5 MG/ML INJ
INTRAVENOUS | Status: AC
  Filled 2016-02-17: qty 10

## 2016-02-17 MED ORDER — ALBUMIN HUMAN 5 % IV SOLN
INTRAVENOUS | Status: DC | PRN
  Administered 2016-02-17: 16:00:00 via INTRAVENOUS

## 2016-02-17 MED ORDER — OXYCODONE HCL 5 MG PO TABS: 5.0000 mg | ORAL_TABLET | Freq: Once | ORAL | Status: DC | PRN

## 2016-02-17 MED ORDER — SUCCINYLCHOLINE CHLORIDE 20 MG/ML IJ SOLN
INTRAMUSCULAR | Status: DC | PRN
  Administered 2016-02-17: 100 mg via INTRAVENOUS

## 2016-02-17 MED ORDER — SUCCINYLCHOLINE CHLORIDE 200 MG/10ML IV SOSY
PREFILLED_SYRINGE | INTRAVENOUS | Status: AC
  Filled 2016-02-17: qty 10

## 2016-02-17 MED ORDER — DEXTROSE 5 % IV SOLN
INTRAVENOUS | Status: DC | PRN
  Administered 2016-02-17: 60 ug/min via INTRAVENOUS
  Administered 2016-02-17: 40 ug/min via INTRAVENOUS

## 2016-02-17 MED ORDER — HEPARIN SODIUM (PORCINE) 1000 UNIT/ML IJ SOLN
INTRAMUSCULAR | Status: DC | PRN
  Administered 2016-02-17: 8 mL via INTRAVENOUS

## 2016-02-17 MED ORDER — MIDAZOLAM HCL 2 MG/2ML IJ SOLN
INTRAMUSCULAR | Status: AC
  Filled 2016-02-17: qty 2

## 2016-02-17 MED ORDER — PHENYLEPHRINE 40 MCG/ML (10ML) SYRINGE FOR IV PUSH (FOR BLOOD PRESSURE SUPPORT)
PREFILLED_SYRINGE | INTRAVENOUS | Status: AC
  Filled 2016-02-17: qty 30

## 2016-02-17 MED ORDER — HEPARIN SODIUM (PORCINE) 1000 UNIT/ML IJ SOLN
INTRAMUSCULAR | Status: AC
  Filled 2016-02-17: qty 1

## 2016-02-17 MED ORDER — ONDANSETRON HCL 4 MG/2ML IJ SOLN
INTRAMUSCULAR | Status: DC | PRN
  Administered 2016-02-17: 4 mg via INTRAVENOUS

## 2016-02-17 MED ORDER — PROTAMINE SULFATE 10 MG/ML IV SOLN
INTRAVENOUS | Status: AC
  Filled 2016-02-17: qty 5

## 2016-02-17 MED ORDER — THROMBIN 20000 UNITS EX SOLR
CUTANEOUS | Status: AC
  Filled 2016-02-17: qty 20000

## 2016-02-17 MED ORDER — PROPOFOL 10 MG/ML IV BOLUS
INTRAVENOUS | Status: DC | PRN
  Administered 2016-02-17: 100 mg via INTRAVENOUS

## 2016-02-17 MED ORDER — OXYCODONE HCL 5 MG/5ML PO SOLN: 5.0000 mg | Freq: Once | ORAL | Status: DC | PRN

## 2016-02-17 MED ORDER — 0.9 % SODIUM CHLORIDE (POUR BTL) OPTIME
TOPICAL | Status: DC | PRN
  Administered 2016-02-17: 2000 mL

## 2016-02-17 MED ORDER — VANCOMYCIN HCL IN DEXTROSE 1-5 GM/200ML-% IV SOLN
INTRAVENOUS | Status: AC
  Filled 2016-02-17: qty 200

## 2016-02-17 MED ORDER — LIDOCAINE HCL (PF) 1 % IJ SOLN
INTRAMUSCULAR | Status: AC
  Filled 2016-02-17: qty 30

## 2016-02-17 MED ORDER — FENTANYL CITRATE (PF) 100 MCG/2ML IJ SOLN
INTRAMUSCULAR | Status: AC
  Filled 2016-02-17: qty 4

## 2016-02-17 MED ORDER — PROPOFOL 10 MG/ML IV BOLUS
INTRAVENOUS | Status: AC
  Filled 2016-02-17: qty 20

## 2016-02-17 MED ORDER — LIDOCAINE 2% (20 MG/ML) 5 ML SYRINGE
INTRAMUSCULAR | Status: AC
  Filled 2016-02-17: qty 20

## 2016-02-17 MED ORDER — SODIUM CHLORIDE 0.9 % IV SOLN
INTRAVENOUS | Status: DC | PRN
  Administered 2016-02-17: 500 mL

## 2016-02-17 SURGICAL SUPPLY — 40 items
BANDAGE ACE 4X5 VEL STRL LF (GAUZE/BANDAGES/DRESSINGS) ×4 IMPLANT
BLADE SURG 15 STRL LF DISP TIS (BLADE) ×2 IMPLANT
BLADE SURG 15 STRL SS (BLADE) ×2
BNDG GAUZE ELAST 4 BULKY (GAUZE/BANDAGES/DRESSINGS) ×4 IMPLANT
CANISTER SUCTION 2500CC (MISCELLANEOUS) ×2 IMPLANT
CANNULA VESSEL 3MM 2 BLNT TIP (CANNULA) ×2 IMPLANT
CLIP TI MEDIUM 6 (CLIP) ×2 IMPLANT
CLIP TI WIDE RED SMALL 6 (CLIP) ×4 IMPLANT
CONT SPEC 4OZ CLIKSEAL STRL BL (MISCELLANEOUS) ×2 IMPLANT
DECANTER SPIKE VIAL GLASS SM (MISCELLANEOUS) ×2 IMPLANT
DERMABOND ADVANCED (GAUZE/BANDAGES/DRESSINGS) ×1
DERMABOND ADVANCED .7 DNX12 (GAUZE/BANDAGES/DRESSINGS) ×1 IMPLANT
ELECT REM PT RETURN 9FT ADLT (ELECTROSURGICAL) ×2
ELECTRODE REM PT RTRN 9FT ADLT (ELECTROSURGICAL) ×1 IMPLANT
GAUZE SPONGE 4X4 16PLY XRAY LF (GAUZE/BANDAGES/DRESSINGS) ×4 IMPLANT
GLOVE BIO SURGEON STRL SZ7.5 (GLOVE) ×2 IMPLANT
GLOVE BIOGEL PI IND STRL 8 (GLOVE) ×1 IMPLANT
GLOVE BIOGEL PI INDICATOR 8 (GLOVE) ×1
GOWN STRL REUS W/ TWL LRG LVL3 (GOWN DISPOSABLE) ×3 IMPLANT
GOWN STRL REUS W/TWL LRG LVL3 (GOWN DISPOSABLE) ×3
KIT BASIN OR (CUSTOM PROCEDURE TRAY) ×2 IMPLANT
KIT ROOM TURNOVER OR (KITS) ×2 IMPLANT
NEEDLE HYPO 25GX1X1/2 BEV (NEEDLE) ×2 IMPLANT
NS IRRIG 1000ML POUR BTL (IV SOLUTION) ×2 IMPLANT
PACK CV ACCESS (CUSTOM PROCEDURE TRAY) ×2 IMPLANT
PAD ARMBOARD 7.5X6 YLW CONV (MISCELLANEOUS) ×4 IMPLANT
PATCH VASC XENOSURE 1CMX6CM (Vascular Products) ×1 IMPLANT
PATCH VASC XENOSURE 1X6 (Vascular Products) ×1 IMPLANT
SPONGE GAUZE 4X4 12PLY STER LF (GAUZE/BANDAGES/DRESSINGS) ×2 IMPLANT
SPONGE SURGIFOAM ABS GEL 100 (HEMOSTASIS) IMPLANT
SUT ETHILON 3 0 PS 1 (SUTURE) ×10 IMPLANT
SUT PROLENE 5 0 C 1 24 (SUTURE) ×2 IMPLANT
SUT PROLENE 6 0 BV (SUTURE) ×4 IMPLANT
SUT VIC AB 3-0 SH 27 (SUTURE) ×3
SUT VIC AB 3-0 SH 27X BRD (SUTURE) ×3 IMPLANT
SUT VICRYL 4-0 PS2 18IN ABS (SUTURE) ×4 IMPLANT
SWAB COLLECTION DEVICE MRSA (MISCELLANEOUS) ×2 IMPLANT
SWAB CULTURE LIQ STUART DBL (MISCELLANEOUS) ×2 IMPLANT
UNDERPAD 30X30 (UNDERPADS AND DIAPERS) ×2 IMPLANT
WATER STERILE IRR 1000ML POUR (IV SOLUTION) ×2 IMPLANT

## 2016-02-17 NOTE — Progress Notes (Signed)
Pt gone down via bed for revision of fistula via bed accompanied by staff and family.

## 2016-02-17 NOTE — Interval H&P Note (Signed)
History and Physical Interval Note:  02/17/2016 1:09 PM  [name] Marks  has presented today for surgery, with the diagnosis of End Stage Renal Disease N18.6; Bleeding right upper arm arteriovenous graft T82.838A  The various methods of treatment have been discussed with the patient and family. After consideration of risks, benefits and other options for treatment, the patient has consented to  Procedure(s): REVISION OF ARTERIOVENOUS GORETEX GRAFT (Right) as a surgical intervention .  The patient's history has been reviewed, patient examined, no change in status, stable for surgery.  I have reviewed the patient's chart and labs.  Questions were answered to the patient's satisfaction.     [name]

## 2016-02-17 NOTE — Transfer of Care (Signed)
Immediate Anesthesia Transfer of Care Note  Patient: [name] Marks  Procedure(s) Performed: Procedure(s): REMOVAL OF TWO ARTERIOVENOUS GORETEX GRAFTS ([place]) (Right) PATCH ANGIOPLASTY (Right)  Patient Location: PACU  Anesthesia Type:General  Level of Consciousness: awake and alert   Airway & Oxygen Therapy: Patient Spontanous Breathing and Patient connected to nasal cannula oxygen  Post-op Assessment: Report given to RN and Post -op Vital signs reviewed and stable  Post vital signs: Reviewed and stable  Last Vitals:  Vitals:   02/17/16 1050 02/17/16 1127  BP: (!) 105/48 (!) 108/35  Pulse: 82 84  Resp: (!) 28 20  Temp: 36.1 C 36.9 C    Last Pain:  Vitals:   02/17/16 1127  TempSrc: Oral  PainSc:          Complications: No apparent anesthesia complications

## 2016-02-17 NOTE — Anesthesia Postprocedure Evaluation (Signed)
Anesthesia Post Note  Patient: Ailene A [name]  Procedure(s) Performed: Procedure(s) (LRB): REMOVAL OF TWO ARTERIOVENOUS GORETEX GRAFTS ([place]) (Right) PATCH ANGIOPLASTY (Right)  Patient location during evaluation: PACU Anesthesia Type: General Level of consciousness: awake and alert Pain management: pain level controlled Vital Signs Assessment: post-procedure vital signs reviewed and stable Respiratory status: spontaneous breathing, nonlabored ventilation, respiratory function stable and patient connected to nasal cannula oxygen Cardiovascular status: blood pressure returned to baseline and stable Postop Assessment: no signs of nausea or vomiting Anesthetic complications: no       Last Vitals:  Vitals:   02/17/16 1850 02/17/16 1855  BP: (!) 90/38 (!) 97/35  Pulse: 77 77  Resp: (!) 23 (!) 23  Temp:  36.4 C    Last Pain:  Vitals:   02/17/16 1855  TempSrc:   PainSc: 4                  Kenlee Vogt,W. EDMOND

## 2016-02-17 NOTE — H&P (View-Only) (Signed)
Date of Admission:  02/15/2016  Date of Consult:  02/16/2016  Reason for Consult: Methicillin sensitive coag negative staphylococcal bacteremia and AV graft infection Referring Physician: Dr. Erin Hearing   HPI:  [name] Marks is an 60 y.o. female with end-stage renal disease on hemodialysis, who was brought to the emergency department via EMS after she became confused and stopped following directions during hemodialysis. She apparently was having some bleeding from her AV fistula graft site as well as frank purulence from this site. She is brought to the ER she was still profoundly come used. She had a CT scan and then an MRI of the brain which failed to show any evidence of acute stroke or infection. Blood cultures were drawn which have subsequently grown methicillin sensitive coagulase-negative staph as a data filed on BCID assay.  She was placed on vancomycin and overnight she has had dramatic improvement in his now is completely lucid and knows where she is. She has been seen by vascular surgery were planning on excision of at least part of her graft though is possible the entire graft may need to be resected. Apparently she is running out of options for vascular access having been a hemodialysis patient for 18 years.     Past Medical History:  Diagnosis Date  . Complication of anesthesia    due to kidney disease  . End stage renal disease on dialysis ([place])   . GERD (gastroesophageal reflux disease)   . Gout     Past Surgical History:  Procedure Laterality Date  . DG AV DIALYSIS GRAFT DECLOT OR    . DILATATION & CURETTAGE/HYSTEROSCOPY WITH TRUECLEAR N/A 11/06/2012   Procedure: DILATATION & CURETTAGE/HYSTEROSCOPY WITH TRUECLEAR;  Surgeon: Terrance Mass, MD;  Location: [facility];  Service: Gynecology;  Laterality: N/A;  Truclear Resectoscopic Polypectomy   . PERIPHERAL VASCULAR CATHETERIZATION N/A 09/14/2014   Procedure: A/V Shuntogram/Fistulagram;  Surgeon: Katha Cabal, MD;  Location: [facility];  Service: Cardiovascular;  Laterality: N/A;  . PERIPHERAL VASCULAR CATHETERIZATION N/A 09/14/2014   Procedure: A/V Shunt Intervention;  Surgeon: Katha Cabal, MD;  Location: [facility];  Service: Cardiovascular;  Laterality: N/A;  . PERIPHERAL VASCULAR CATHETERIZATION Right 12/09/2014   Procedure: A/V Shuntogram/Fistulagram;  Surgeon: Algernon Huxley, MD;  Location: [facility];  Service: Cardiovascular;  Laterality: Right;  . PERIPHERAL VASCULAR CATHETERIZATION N/A 12/09/2014   Procedure: A/V Shunt Intervention;  Surgeon: Algernon Huxley, MD;  Location: [facility];  Service: Cardiovascular;  Laterality: N/A;  . PERIPHERAL VASCULAR CATHETERIZATION Right 05/24/2015   Procedure: A/V Shuntogram;  Surgeon: Serafina Mitchell, MD;  Location: [facility];  Service: Cardiovascular;  Laterality: Right;  . PERIPHERAL VASCULAR CATHETERIZATION Right 05/24/2015   Procedure: Peripheral Vascular Balloon Angioplasty;  Surgeon: Serafina Mitchell, MD;  Location: [facility];  Service: Cardiovascular;  Laterality: Right;  right arm shunt  . PERIPHERAL VASCULAR CATHETERIZATION N/A 06/13/2015   Procedure: A/V Shuntogram/Fistulagram;  Surgeon: Algernon Huxley, MD;  Location: [facility];  Service: Cardiovascular;  Laterality: N/A;  . PERIPHERAL VASCULAR CATHETERIZATION N/A 06/13/2015   Procedure: A/V Shunt Intervention;  Surgeon: Algernon Huxley, MD;  Location: [facility];  Service: Cardiovascular;  Laterality: N/A;  . TUBAL LIGATION  1983    Social History:  reports that she has never smoked. She has never used smokeless tobacco. She reports that she does not drink alcohol or  use drugs.   Family History  Problem Relation Age of Onset  . Diabetes Mother   . Hypertension Mother   . Heart disease Mother   . Alcohol abuse Mother   . Kidney disease Mother   . Cancer Father     COLON  . Alcohol abuse Father   . Breast cancer  Sister 57  . Hypertension Sister   . Kidney disease Sister   . Hypertension Son     Allergies  Allergen Reactions  . Contrast Media [Iodinated Diagnostic Agents] Anaphylaxis  . Ancef [Cefazolin Sodium] Nausea And Vomiting  . Naproxen Sodium Itching     Medications: I have reviewed patients current medications as documented in Epic Anti-infectives    Start     Dose/Rate Route Frequency Ordered Stop   02/17/16 1800  ceFAZolin (ANCEF) IVPB 2g/100 mL premix     2 g 200 mL/hr over 30 Minutes Intravenous Every M-W-F (1800) 02/16/16 1342     02/17/16 1200  vancomycin (VANCOCIN) IVPB 1000 mg/200 mL premix  Status:  Discontinued     1,000 mg 200 mL/hr over 60 Minutes Intravenous Every M-W-F (Hemodialysis) 02/16/16 0731 02/16/16 1317   02/17/16 1200  vancomycin (VANCOCIN) IVPB 1000 mg/200 mL premix  Status:  Discontinued     1,000 mg 200 mL/hr over 60 Minutes Intravenous On call to O.R. 02/16/16 3419 02/16/16 0857   02/17/16 1200  ceFAZolin (ANCEF) IVPB 2g/100 mL premix  Status:  Discontinued     2 g 200 mL/hr over 30 Minutes Intravenous Every M-W-F (Hemodialysis) 02/16/16 1324 02/16/16 1342   02/16/16 0800  ceFAZolin (ANCEF) IVPB 1 g/50 mL premix  Status:  Discontinued     1 g 100 mL/hr over 30 Minutes Intravenous Every 24 hours 02/16/16 0653 02/16/16 0724   02/16/16 0745  vancomycin (VANCOCIN) 2,000 mg in sodium chloride 0.9 % 500 mL IVPB     2,000 mg 250 mL/hr over 120 Minutes Intravenous  Once 02/16/16 0731 02/16/16 1052         ROS: as in HPI otherwise remainder of 12 point Review of Systems negative  Blood pressure (!) 121/35, pulse 99, temperature 99.3 F (37.4 C), temperature source Oral, resp. rate 17, height _0  (1.549 m), weight 209 lb 7 oz (95 kg), SpO2 99 %. General: Alert and awake, oriented x3, not in any acute distress. HEENT: anicteric sclera,  EOMI, oropharynx clear and without exudate Cardiovascular: tachy  rate, normal r, faint murmur Pulmonary: clear to  auscultation bilaterally, no wheezing, rales or rhonchi Gastrointestinal: soft nontender, nondistended, normal bowel sounds, Musculoskeletal: no  clubbing or edema noted bilaterally Skin, soft tissue:  AV graft site with bandage Neuro: nonfocal, strength and sensation intact   Results for orders placed or performed during the hospital encounter of 02/15/16 (from the past 48 hour(s))  CBC     Status: Abnormal   Collection Time: 02/15/16  8:22 AM  Result Value Ref Range   WBC 14.4 (H) 4.0 - 10.5 K/uL   RBC 2.82 (L) 3.87 - 5.11 MIL/uL   Hemoglobin 7.5 (L) 12.0 - 15.0 g/dL   HCT 23.7 (L) 36.0 - 46.0 %   MCV 84.0 78.0 - 100.0 fL   MCH 26.6 26.0 - 34.0 pg   MCHC 31.6 30.0 - 36.0 g/dL   RDW 19.4 (H) 11.5 - 15.5 %   Platelets 421 (H) 150 - 400 K/uL  Comprehensive metabolic panel     Status: Abnormal   Collection Time: 02/15/16  8:22 AM  Result Value Ref Range   Sodium 137 135 - 145 mmol/L   Potassium 2.8 (L) 3.5 - 5.1 mmol/L   Chloride 92 (L) 101 - 111 mmol/L   CO2 28 22 - 32 mmol/L   Glucose, Bld 102 (H) 65 - 99 mg/dL   BUN 18 6 - 20 mg/dL   Creatinine, Ser 7.08 (H) 0.44 - 1.00 mg/dL   Calcium 10.0 8.9 - 10.3 mg/dL   Total Protein 6.8 6.5 - 8.1 g/dL   Albumin 2.6 (L) 3.5 - 5.0 g/dL   AST 23 15 - 41 U/L   ALT 15 14 - 54 U/L   Alkaline Phosphatase 116 38 - 126 U/L   Total Bilirubin 1.2 0.3 - 1.2 mg/dL   GFR calc non Af Amer 6 (L) >60 mL/min   GFR calc Af Amer 6 (L) >60 mL/min    Comment: (NOTE) The eGFR has been calculated using the CKD EPI equation. This calculation has not been validated in all clinical situations. eGFR's persistently <60 mL/min signify possible Chronic Kidney Disease.    Anion gap 17 (H) 5 - 15  Protime-INR     Status: None   Collection Time: 02/15/16  8:22 AM  Result Value Ref Range   Prothrombin Time 14.9 11.4 - 15.2 seconds   INR 1.16   Type and screen     Status: None   Collection Time: 02/15/16  8:27 AM  Result Value Ref Range   ABO/RH(D) A POS      Antibody Screen NEG    Sample Expiration 02/18/2016    Unit Number [id number]    Blood Component Type RBC LR PHER1    Unit division 00    Status of Unit ISSUED,FINAL    Transfusion Status OK TO TRANSFUSE    Crossmatch Result Compatible    Unit Number [id number]    Blood Component Type RBC LR PHER2    Unit division 00    Status of Unit ISSUED,FINAL    Transfusion Status OK TO TRANSFUSE    Crossmatch Result Compatible   ABO/Rh     Status: None   Collection Time: 02/15/16  8:27 AM  Result Value Ref Range   ABO/RH(D) A POS   CBG monitoring, ED     Status: None   Collection Time: 02/15/16  9:23 AM  Result Value Ref Range   Glucose-Capillary 94 65 - 99 mg/dL  Culture, blood (Routine X 2) w Reflex to ID Panel     Status: None (Preliminary result)   Collection Time: 02/15/16 10:20 AM  Result Value Ref Range   Specimen Description BLOOD LEFT HAND    Special Requests IN PEDIATRIC BOTTLE  2CC    Culture  Setup Time      GRAM POSITIVE COCCI IN CLUSTERS IN PEDIATRIC BOTTLE CRITICAL VALUE NOTED.  VALUE IS CONSISTENT WITH PREVIOUSLY REPORTED AND CALLED VALUE.    Culture TOO YOUNG TO READ    Report Status PENDING   Culture, blood (Routine X 2) w Reflex to ID Panel     Status: None (Preliminary result)   Collection Time: 02/15/16 10:27 AM  Result Value Ref Range   Specimen Description BLOOD LEFT HAND    Special Requests IN PEDIATRIC BOTTLE  2CC    Culture  Setup Time      GRAM POSITIVE COCCI IN CLUSTERS IN PEDIATRIC BOTTLE Organism ID to follow CRITICAL RESULT CALLED TO, READ BACK BY AND VERIFIED WITH: VERONDA BRYK,PHARMD @0644 02/16/16 MKELLY,MLT    Culture TOO YOUNG   TO READ    Report Status PENDING   Blood Culture ID Panel (Reflexed)     Status: Abnormal   Collection Time: 02/15/16 10:27 AM  Result Value Ref Range   Enterococcus species NOT DETECTED NOT DETECTED   Listeria monocytogenes NOT DETECTED NOT DETECTED   Staphylococcus species DETECTED (A) NOT DETECTED     Comment: CRITICAL RESULT CALLED TO, READ BACK BY AND VERIFIED WITH: V BRYK, PHARMD @0644 02/16/16 MKELLY,MLT    Staphylococcus aureus NOT DETECTED NOT DETECTED   Methicillin resistance NOT DETECTED NOT DETECTED   Streptococcus species NOT DETECTED NOT DETECTED   Streptococcus agalactiae NOT DETECTED NOT DETECTED   Streptococcus pneumoniae NOT DETECTED NOT DETECTED   Streptococcus pyogenes NOT DETECTED NOT DETECTED   Acinetobacter baumannii NOT DETECTED NOT DETECTED   Enterobacteriaceae species NOT DETECTED NOT DETECTED   Enterobacter cloacae complex NOT DETECTED NOT DETECTED   Escherichia coli NOT DETECTED NOT DETECTED   Klebsiella oxytoca NOT DETECTED NOT DETECTED   Klebsiella pneumoniae NOT DETECTED NOT DETECTED   Proteus species NOT DETECTED NOT DETECTED   Serratia marcescens NOT DETECTED NOT DETECTED   Haemophilus influenzae NOT DETECTED NOT DETECTED   Neisseria meningitidis NOT DETECTED NOT DETECTED   Pseudomonas aeruginosa NOT DETECTED NOT DETECTED   Candida albicans NOT DETECTED NOT DETECTED   Candida glabrata NOT DETECTED NOT DETECTED   Candida krusei NOT DETECTED NOT DETECTED   Candida parapsilosis NOT DETECTED NOT DETECTED   Candida tropicalis NOT DETECTED NOT DETECTED  I-Stat CG4 Lactic Acid, ED     Status: Abnormal   Collection Time: 02/15/16 11:07 AM  Result Value Ref Range   Lactic Acid, Venous 2.06 (HH) 0.5 - 1.9 mmol/L   Comment NOTIFIED PHYSICIAN   Ethanol     Status: None   Collection Time: 02/15/16 11:30 AM  Result Value Ref Range   Alcohol, Ethyl (B) <5 <5 mg/dL    Comment:        LOWEST DETECTABLE LIMIT FOR SERUM ALCOHOL IS 5 mg/dL FOR MEDICAL PURPOSES ONLY   Ammonia     Status: None   Collection Time: 02/15/16 11:30 AM  Result Value Ref Range   Ammonia 12 9 - 35 umol/L  Lactic acid, plasma     Status: None   Collection Time: 02/15/16  6:08 PM  Result Value Ref Range   Lactic Acid, Venous 1.3 0.5 - 1.9 mmol/L  CDS serology     Status: None    Collection Time: 02/15/16  6:12 PM  Result Value Ref Range   CDS serology specimen      SPECIMEN WILL BE HELD FOR 14 DAYS IF TESTING IS REQUIRED  CBC     Status: Abnormal   Collection Time: 02/15/16  6:12 PM  Result Value Ref Range   WBC 13.3 (H) 4.0 - 10.5 K/uL   RBC 2.58 (L) 3.87 - 5.11 MIL/uL   Hemoglobin 6.7 (LL) 12.0 - 15.0 g/dL    Comment: REPEATED TO VERIFY CRITICAL RESULT CALLED TO, READ BACK BY AND VERIFIED WITH: C. HOLDSON RN [id number] GREEN R    HCT 21.6 (L) 36.0 - 46.0 %   MCV 83.7 78.0 - 100.0 fL   MCH 26.0 26.0 - 34.0 pg   MCHC 31.0 30.0 - 36.0 g/dL   RDW 19.6 (H) 11.5 - 15.5 %   Platelets 402 (H) 150 - 400 K/uL  Creatinine, serum     Status: Abnormal   Collection Time: 02/15/16  6:12 PM  Result Value Ref   Range   Creatinine, Ser 7.90 (H) 0.44 - 1.00 mg/dL   GFR calc non Af Amer 5 (L) >60 mL/min   GFR calc Af Amer 6 (L) >60 mL/min    Comment: (NOTE) The eGFR has been calculated using the CKD EPI equation. This calculation has not been validated in all clinical situations. eGFR's persistently <60 mL/min signify possible Chronic Kidney Disease.   Blood gas, arterial     Status: Abnormal   Collection Time: 02/15/16  7:02 PM  Result Value Ref Range   FIO2 32.00    Delivery systems NASAL CANNULA    pH, Arterial 7.547 (H) 7.350 - 7.450   pCO2 arterial 37.0 32.0 - 48.0 mmHg   pO2, Arterial 163 (H) 83.0 - 108.0 mmHg   Bicarbonate 32.1 (H) 20.0 - 28.0 mmol/L   Acid-Base Excess 8.9 (H) 0.0 - 2.0 mmol/L   O2 Saturation 99.6 %   Patient temperature 98.6    Collection site LEFT RADIAL    Drawn by [id number]    Sample type ARTERIAL DRAW    Allens test (pass/fail) PASS PASS  Prepare RBC     Status: None   Collection Time: 02/15/16  7:30 PM  Result Value Ref Range   Order Confirmation ORDER PROCESSED BY BLOOD BANK   CBC     Status: Abnormal   Collection Time: 02/16/16  2:12 AM  Result Value Ref Range   WBC 15.8 (H) 4.0 - 10.5 K/uL   RBC 2.78 (L) 3.87 - 5.11 MIL/uL     Hemoglobin 7.5 (L) 12.0 - 15.0 g/dL   HCT 23.2 (L) 36.0 - 46.0 %   MCV 83.5 78.0 - 100.0 fL   MCH 27.0 26.0 - 34.0 pg   MCHC 32.3 30.0 - 36.0 g/dL   RDW 18.6 (H) 11.5 - 15.5 %   Platelets 300 150 - 400 K/uL  Basic metabolic panel     Status: Abnormal   Collection Time: 02/16/16  2:12 AM  Result Value Ref Range   Sodium 137 135 - 145 mmol/L   Potassium 3.2 (L) 3.5 - 5.1 mmol/L   Chloride 98 (L) 101 - 111 mmol/L   CO2 28 22 - 32 mmol/L   Glucose, Bld 96 65 - 99 mg/dL   BUN 16 6 - 20 mg/dL   Creatinine, Ser 5.63 (H) 0.44 - 1.00 mg/dL   Calcium 8.9 8.9 - 10.3 mg/dL   GFR calc non Af Amer 7 (L) >60 mL/min   GFR calc Af Amer 9 (L) >60 mL/min    Comment: (NOTE) The eGFR has been calculated using the CKD EPI equation. This calculation has not been validated in all clinical situations. eGFR's persistently <60 mL/min signify possible Chronic Kidney Disease.    Anion gap 11 5 - 15  Ferritin     Status: Abnormal   Collection Time: 02/16/16 12:13 PM  Result Value Ref Range   Ferritin 942 (H) 11 - 307 ng/mL  Iron and TIBC     Status: Abnormal   Collection Time: 02/16/16 12:13 PM  Result Value Ref Range   Iron 16 (L) 28 - 170 ug/dL   TIBC 99 (L) 250 - 450 ug/dL   Saturation Ratios 16 10.4 - 31.8 %   UIBC 83 ug/dL  Surgical pcr screen     Status: None   Collection Time: 02/16/16  1:30 PM  Result Value Ref Range   MRSA, PCR NEGATIVE NEGATIVE   Staphylococcus aureus NEGATIVE NEGATIVE    Comment:  The Xpert SA Assay (FDA approved for NASAL specimens in patients over 37 years of age), is one component of a comprehensive surveillance program.  Test performance has been validated by [facility] for patients greater than or equal to 53 year old. It is not intended to diagnose infection nor to guide or monitor treatment.    _0 (sdes,specrequest,cult,reptstatus)   ) Recent Results (from the past 720 hour(s))  Culture, blood (Routine X 2) w Reflex to ID Panel      Status: None (Preliminary result)   Collection Time: 02/15/16 10:20 AM  Result Value Ref Range Status   Specimen Description BLOOD LEFT HAND  Final   Special Requests IN PEDIATRIC BOTTLE  2CC  Final   Culture  Setup Time   Final    GRAM POSITIVE COCCI IN CLUSTERS IN PEDIATRIC BOTTLE CRITICAL VALUE NOTED.  VALUE IS CONSISTENT WITH PREVIOUSLY REPORTED AND CALLED VALUE.    Culture TOO YOUNG TO READ  Final   Report Status PENDING  Incomplete  Culture, blood (Routine X 2) w Reflex to ID Panel     Status: None (Preliminary result)   Collection Time: 02/15/16 10:27 AM  Result Value Ref Range Status   Specimen Description BLOOD LEFT HAND  Final   Special Requests IN PEDIATRIC BOTTLE  2CC  Final   Culture  Setup Time   Final    GRAM POSITIVE COCCI IN CLUSTERS IN PEDIATRIC BOTTLE Organism ID to follow CRITICAL RESULT CALLED TO, READ BACK BY AND VERIFIED WITH: VERONDA BRYK,PHARMD _1  02/16/16 MKELLY,MLT    Culture TOO YOUNG TO READ  Final   Report Status PENDING  Incomplete  Blood Culture ID Panel (Reflexed)     Status: Abnormal   Collection Time: 02/15/16 10:27 AM  Result Value Ref Range Status   Enterococcus species NOT DETECTED NOT DETECTED Final   Listeria monocytogenes NOT DETECTED NOT DETECTED Final   Staphylococcus species DETECTED (A) NOT DETECTED Final    Comment: CRITICAL RESULT CALLED TO, READ BACK BY AND VERIFIED WITH: V BRYK, PHARMD _2  02/16/16 MKELLY,MLT    Staphylococcus aureus NOT DETECTED NOT DETECTED Final   Methicillin resistance NOT DETECTED NOT DETECTED Final   Streptococcus species NOT DETECTED NOT DETECTED Final   Streptococcus agalactiae NOT DETECTED NOT DETECTED Final   Streptococcus pneumoniae NOT DETECTED NOT DETECTED Final   Streptococcus pyogenes NOT DETECTED NOT DETECTED Final   Acinetobacter baumannii NOT DETECTED NOT DETECTED Final   Enterobacteriaceae species NOT DETECTED NOT DETECTED Final   Enterobacter cloacae complex NOT DETECTED NOT DETECTED  Final   Escherichia coli NOT DETECTED NOT DETECTED Final   Klebsiella oxytoca NOT DETECTED NOT DETECTED Final   Klebsiella pneumoniae NOT DETECTED NOT DETECTED Final   Proteus species NOT DETECTED NOT DETECTED Final   Serratia marcescens NOT DETECTED NOT DETECTED Final   Haemophilus influenzae NOT DETECTED NOT DETECTED Final   Neisseria meningitidis NOT DETECTED NOT DETECTED Final   Pseudomonas aeruginosa NOT DETECTED NOT DETECTED Final   Candida albicans NOT DETECTED NOT DETECTED Final   Candida glabrata NOT DETECTED NOT DETECTED Final   Candida krusei NOT DETECTED NOT DETECTED Final   Candida parapsilosis NOT DETECTED NOT DETECTED Final   Candida tropicalis NOT DETECTED NOT DETECTED Final  Surgical pcr screen     Status: None   Collection Time: 02/16/16  1:30 PM  Result Value Ref Range Status   MRSA, PCR NEGATIVE NEGATIVE Final   Staphylococcus aureus NEGATIVE NEGATIVE Final    Comment:  The Xpert SA Assay (FDA approved for NASAL specimens in patients over 3 years of age), is one component of a comprehensive surveillance program.  Test performance has been validated by [facility] for patients greater than or equal to 25 year old. It is not intended to diagnose infection nor to guide or monitor treatment.      Impression/Recommendation  Active Problems:   Altered mental status   Anemia   Noreene A Marks is a 60 y.o. 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 It may be difficult to time that to ensure that she is clear to bacteremia prior to placement of a catheter  Would repeat blood cultures today and tomorrow AFTER graft surgery  She will need a 6 week course of IV ancef with HD once once cultures have cleared.  I will also add rifampin once blood cultures are clear given Likelihood that prosthetic material still will be present in the remaining graft.  02/16/2016, 3:45 PM   Thank you so much for this interesting consult  [place] [facility] [place] [phone] (pager) [phone] (office) 02/16/2016, 3:45 PM  Rhina Brackett [place] 02/16/2016, 3:45 PM   \c
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
## 2016-02-17 NOTE — Anesthesia Preprocedure Evaluation (Signed)
Anesthesia Evaluation  Patient identified by MRN, date of birth, ID band Patient confused  General Assessment Comment:Pt was admitted with altered mental status  Reviewed: Allergy & Precautions, H&P , NPO status , Patient's Chart, lab work & pertinent test results  Airway Mallampati: II   Neck ROM: full    Dental   Pulmonary neg pulmonary ROS,    breath sounds clear to auscultation       Cardiovascular negative cardio ROS   Rhythm:regular Rate:Normal     Neuro/Psych  Headaches,    GI/Hepatic GERD  ,  Endo/Other  Morbid obesity  Renal/GU ESRF and DialysisRenal disease     Musculoskeletal   Abdominal   Peds  Hematology  (+) anemia ,   Anesthesia Other Findings   Reproductive/Obstetrics                             Anesthesia Physical Anesthesia Plan  ASA: III  Anesthesia Plan: General   Post-op Pain Management:    Induction: Intravenous  Airway Management Planned: Oral ETT  Additional Equipment:   Intra-op Plan:   Post-operative Plan: Extubation in OR  Informed Consent: I have reviewed the patients History and Physical, chart, labs and discussed the procedure including the risks, benefits and alternatives for the proposed anesthesia with the patient or authorized representative who has indicated his/her understanding and acceptance.     Plan Discussed with: CRNA, Anesthesiologist and Surgeon  Anesthesia Plan Comments:         Anesthesia Quick Evaluation

## 2016-02-17 NOTE — Progress Notes (Signed)
Pt returned from [place]. Alert, oriented X 4.  VSS.  Pt has mild discomfort from surgery in right arm.  Family at bedside.

## 2016-02-17 NOTE — Progress Notes (Signed)
Patient back from dialysis.  Daughter at bedside.  Pt NPO for sx revision of fistula right upper arm.  Consent signed and in the chart for revision.
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
## 2016-02-17 NOTE — Procedures (Signed)
Tol HD.  For OR today. S/P PRBCs for anemia. No hemodynamic instability. Remains confused. Nancy Manuele C

## 2016-02-17 NOTE — Progress Notes (Signed)
Family Medicine Teaching Service Daily Progress Note Intern Pager: [phone]  Patient name: [name] Marks Medical record number: [id number] Date of birth: 1956-02-08 Age: 60 y.o. Gender: female  Primary Care Provider: Lockie Pares, MD Consultants: Nephrology, ID  Code Status: Full   Pt Overview and Major Events to Date:  12/22: removal of infected R arm graft with bovine pericardial patch angioplasty of R brachial artery, and removal of R forearm graft.   Assessment and Plan: [name] Marks is a 60 y.o. female presenting with AMS . PMH is significant for ESRD on HD, complex atypical endometrial hyperplasia   Bacteremia - Patient with 2/2 blood cultures positive for Coagulase negative staphylococcus from AV graft infection. She is s/p removal.  WBC 16.2 >15.9 last night (CBC from this AM pending),  Vitals stable over night. Afebrile.  - Ancef per pharmacy  - repeat blood cultures obtained evening 12/22 after graft removal - Will need an TTE, followed by TEE to rule out endocarditis - Per Infectious disease,  6 week course of IV ancef. Given full graft was removed completely, will need to touch base with ID to determine if she still needs rifampin as well once cultures have cleared - wound care to follow up  - Incentive spirometry to improve air movement post-operatively.  ESRD on MWF schedule  - HD on 12/22. - graft removed 12/22. - tunneled HD catheter tomorrow (12/24) - follow renal function with dialysis  - nephrology consulted  Anemia - Baseline Hgb 11. Has had 4units pRBCs total this admission, last 12/22.  Hgb drop likely due to fistula bleeding. Hgm 9.7 to 9.2 post-op.  - monitor CBC  AMS, resolved. Likely 2/2 to bacteremia from AV graft infection, resolved. On admit head CT/MRI without acute findings   Complex Atypical Endometrial Hyperplasia - continue Megace  Disposition: SNF per PT recs   Subjective:  Doing well post-operatively. More awake this AM compared  to post-op. Pain well controlled. Passing flatus. Tolerating PO. No swelling, fevers, chills.   Objective: Temp:  [97 F (36.1 C)-98.5 F (36.9 C)] 98.1 F (36.7 C) (12/23 0541) Pulse Rate:  [73-88] 85 (12/23 0541) Resp:  [16-30] 16 (12/23 0541) BP: (81-126)/(26-49) 91/36 (12/23 0541) SpO2:  [99 %-100 %] 100 % (12/23 0541) Weight:  [214 lb 8.1 oz (97.3 kg)] 214 lb 8.1 oz (97.3 kg) (12/22 1050) Physical Exam: General: Lying in bed in NAD  Cardiovascular: RRR, no murmurs. No pitting edema. No JVD.  Respiratory: CTAB, normal WOB. Intermittent cough with deep inspiration Abdomen: BS+, soft, non-distended, non-tender.  Extremities: Right upper extremity completely wrapped in ACE bandage, not taken down. Neuro: alert and oriented to person, place and time.   Laboratory:  Recent Labs Lab 02/17/16 0217 02/17/16 2234 02/18/16 0621  WBC 16.2* 15.9* 14.9*  HGB 9.6* 9.2* 8.6*  HCT 29.1* 27.1* 25.9*  PLT 260 219 211    Recent Labs Lab 02/15/16 0822 02/15/16 1812 02/16/16 0212 02/17/16 0217  NA 137  --  137 136  K 2.8*  --  3.2* 3.7  CL 92*  --  98* 98*  CO2 28  --  28 23  BUN 18  --  16 24*  CREATININE 7.08* 7.90* 5.63* 7.33*  CALCIUM 10.0  --  8.9 9.5  PROT 6.8  --   --   --   BILITOT 1.2  --   --   --   ALKPHOS 116  --   --   --   ALT  15  --   --   --   AST 23  --   --   --   GLUCOSE 102*  --  96 104*     Imaging/Diagnostic Tests: No results found.  Archie Patten, MD 02/18/2016, 7:19 AM PGY-3, [place] [facility] Intern pager: [phone], text pages welcome

## 2016-02-17 NOTE — Op Note (Signed)
NAME: [name] Marks   MRN: [id number] DOB: 1955-07-08    DATE OF OPERATION: 02/17/2016  PREOP DIAGNOSIS: Infected right upper arm graft  POSTOP DIAGNOSIS: Same  PROCEDURE:  1. Removal of infected right upper arm graft 2. Bovine pericardial patch angioplasty of right brachial artery 3. Removal of old right forearm graft  SURGEON: Judeth Cornfield. Scot Dock, MD, FACS  ASSIST: Silva Bandy, PAC, Leontine Locket, [place]  ANESTHESIA: Gen.   EBL: 100 cc  INDICATIONS: [name] Marks is a 60 y.o. female who presented with purulent drainage from an open wound in the lower part of her graft. In addition she had an eschar in the upper graft and when this was removed prior to prepping it was active bleeding so pressure was held for hemostasis.  FINDINGS: The graft was clearly infected and both areas of concern. There were stents throughout the graft and also a stent that extended into the axillary vein to the subclavian vein at the level of the clavicle. This could not be entirely removed as it was incorporated into the vein.  TECHNIQUE: The patient was taken to the operative room and received general anesthetic. There was an open wound just above the antecubital level which was draining purulent material. There was an eschar overlying the upper graft and this was removed and there was active bleeding from this area. Pressure was held while the arm was prepped and draped in usual sterile fashion. Pressure was maintained to control bleeding from this area. I exposed the graft at the site of drainage above the antecubital level such that I could clamp it at this level. Next a transverse incision was made at the axilla and here the venous limb of the graft was dissected free and clamped. Hemostasis was then obtained at the site of bleeding. I followed the vein up very high and this extended all the way into the axillary vein up to the level of the subclavian vein based on her chest x-ray which showed that the  stent extended to the level of the clavicle. The stent was incorporated into the vein and could not be removed. I excised the majority of it and then divided it and then this was oversewn with a 5-0 Prolene suture. I then remove the entire graft in the upper arm using 2 additional incisions. It was a nonfunctioning graft in the forearm and using 3 additional incisions this graft was also removed area at this did not appear to be infected. I did obtain a culture of the graft where the patient had purulent drainage.  Next for adequate vein but was no adequate vein in the arm and therefore I elected to patch the brachial artery with a bovine pericardial patch. Patient was heparinized. I dissected out the brachial artery proximal and distal to the arterial anastomosis. The artery was clamped proximally and distally and the entire graft was removed. The bovine pericardial patch was then sewn using continuous 6-0 Prolene suture. The arteries were backbled and flushed properly and then the anastomosis completed. Flow was reestablished to the right arm and it was a good radial and ulnar signal with the Doppler. Hemostasis was obtained in the wounds. The incision at the antecubital level was closed with deeper 3-0 Vicryl and then the skin closed with interrupted 3-0 nylon's. The axillary incision was closed with a deep layer of 3-0 Vicryl and the skin closed with 4-0 Vicryl. The remaining incisions were closed with interrupted 3-0 nylon's. Sterile dressing was applied. The  patient tolerated the procedure well and was transferred to the recovery room in stable condition. All needle and sponge counts were correct.  [name], MD, FACS [facility]  DATE OF DICTATION:   02/17/2016

## 2016-02-17 NOTE — Progress Notes (Signed)
At beginning of shift was alerted to pt bleeding from infected site on AVF. Bandage was saturated with blood. Bandage was changed and bleeding was clotted off. MD notified. BP 93/36. MD ordered 2units of PRBC. One unit has infused and pt had bowel movement. Pt was cleaned up then within fifteen minutes pt AVF started bleeding again and this time had saturated bed sheets and bandage. MD notified, pt still awake and alert. Bleeding has stopped and another bandage was placed, pt is now resting comfortably in bed with 2nd unit of PRBC infusing, with about 50cc left. Will finish 2nd unit of PRBC and then place an order for STAT CBC and alert MD to hbg.

## 2016-02-17 NOTE — Anesthesia Procedure Notes (Signed)
Procedure Name: Intubation Date/Time: 02/17/2016 2:30 PM Performed by: Ollen Bowl Pre-anesthesia Checklist: Patient identified, Emergency Drugs available, Suction available, Patient being monitored and Timeout performed Patient Re-evaluated:Patient Re-evaluated prior to inductionOxygen Delivery Method: Circle system utilized and Simple face mask Preoxygenation: Pre-oxygenation with 100% oxygen Intubation Type: IV induction Ventilation: Mask ventilation without difficulty Laryngoscope Size: Miller and 2 Grade View: Grade II Tube type: Oral Tube size: 7.5 mm Number of attempts: 1 Airway Equipment and Method: Patient positioned with wedge pillow and Stylet Placement Confirmation: ETT inserted through vocal cords under direct vision,  positive ETCO2 and breath sounds checked- equal and bilateral Secured at: 21 cm Tube secured with: Tape Dental Injury: Teeth and Oropharynx as per pre-operative assessment

## 2016-02-17 NOTE — Interval H&P Note (Signed)
History and Physical Interval Note:  02/17/2016 2:13 PM  [name] Marks  has presented today for surgery, with the diagnosis of BACTEREMIA  The various methods of treatment have been discussed with the patient and family. After consideration of risks, benefits and other options for treatment, the patient has consented to  Procedure(s): TRANSESOPHAGEAL ECHOCARDIOGRAM (TEE) (N/A) as a surgical intervention .  The patient's history has been reviewed, patient examined, no change in status, stable for surgery.  I have reviewed the patient's chart and labs.  Questions were answered to the patient's satisfaction.     [name]

## 2016-02-18 ENCOUNTER — Inpatient Hospital Stay (HOSPITAL_COMMUNITY): Payer: Medicare Other

## 2016-02-18 DIAGNOSIS — R7881 Bacteremia: Secondary | ICD-10-CM

## 2016-02-18 DIAGNOSIS — T827XXD Infection and inflammatory reaction due to other cardiac and vascular devices, implants and grafts, subsequent encounter: Secondary | ICD-10-CM

## 2016-02-18 LAB — CBC
HCT: 25.9 % — ABNORMAL LOW (ref 36.0–46.0)
Hemoglobin: 8.6 g/dL — ABNORMAL LOW (ref 12.0–15.0)
MCH: 27 pg (ref 26.0–34.0)
MCHC: 33.2 g/dL (ref 30.0–36.0)
MCV: 81.4 fL (ref 78.0–100.0)
PLATELETS: 211 10*3/uL (ref 150–400)
RBC: 3.18 MIL/uL — ABNORMAL LOW (ref 3.87–5.11)
RDW: 18.7 % — AB (ref 11.5–15.5)
WBC: 14.9 10*3/uL — ABNORMAL HIGH (ref 4.0–10.5)

## 2016-02-18 LAB — HEPATITIS B SURFACE ANTIGEN: Hepatitis B Surface Ag: NEGATIVE

## 2016-02-18 LAB — MRSA CULTURE: Culture: NOT DETECTED

## 2016-02-18 LAB — ECHOCARDIOGRAM COMPLETE
Height: 61 in
WEIGHTICAEL: 3432.12 [oz_av]

## 2016-02-18 LAB — HCV COMMENT:

## 2016-02-18 LAB — HEPATITIS C ANTIBODY (REFLEX)

## 2016-02-18 MED ORDER — CEFAZOLIN SODIUM-DEXTROSE 2-4 GM/100ML-% IV SOLN
2.0000 g | INTRAVENOUS | Status: DC
  Administered 2016-02-22 – 2016-03-05 (×6): 2 g via INTRAVENOUS
  Filled 2016-02-18 (×9): qty 100

## 2016-02-18 MED ORDER — OXYCODONE-ACETAMINOPHEN 5-325 MG PO TABS
1.0000 | ORAL_TABLET | ORAL | Status: DC | PRN
  Administered 2016-02-19: 2 via ORAL
  Administered 2016-02-19: 1 via ORAL
  Administered 2016-02-19 – 2016-02-21 (×4): 2 via ORAL
  Administered 2016-02-23: 1 via ORAL
  Filled 2016-02-18 (×6): qty 2

## 2016-02-18 MED ORDER — CEFAZOLIN IN D5W 1 GM/50ML IV SOLN
1.0000 g | INTRAVENOUS | Status: AC
  Administered 2016-02-19 – 2016-02-21 (×3): 1 g via INTRAVENOUS
  Filled 2016-02-18 (×4): qty 50

## 2016-02-18 MED ORDER — CEFAZOLIN IN D5W 1 GM/50ML IV SOLN
1.0000 g | Freq: Once | INTRAVENOUS | Status: AC
  Administered 2016-02-18: 1 g via INTRAVENOUS
  Filled 2016-02-18: qty 50

## 2016-02-18 MED ORDER — MORPHINE SULFATE (PF) 2 MG/ML IV SOLN: 2.0000 mg | INTRAVENOUS | Status: DC | PRN

## 2016-02-18 MED ORDER — RIFAMPIN 300 MG PO CAPS
300.0000 mg | ORAL_CAPSULE | Freq: Two times a day (BID) | ORAL | Status: DC
  Administered 2016-02-18 – 2016-03-06 (×30): 300 mg via ORAL
  Filled 2016-02-18 (×35): qty 1

## 2016-02-18 MED ORDER — VANCOMYCIN HCL IN DEXTROSE 1-5 GM/200ML-% IV SOLN: 1000.0000 mg | INTRAVENOUS | Status: DC

## 2016-02-18 NOTE — Progress Notes (Signed)
Subjective: No new complaints   Antibiotics:  Anti-infectives    Start     Dose/Rate Route Frequency Ordered Stop   02/22/16 1800  ceFAZolin (ANCEF) IVPB 2g/100 mL premix     2 g 200 mL/hr over 30 Minutes Intravenous Every M-W-F (1800) 02/18/16 1236     02/19/16 1800  ceFAZolin (ANCEF) IVPB 1 g/50 mL premix     1 g 100 mL/hr over 30 Minutes Intravenous Every 24 hours 02/18/16 1236 02/22/16 1759   02/19/16 0700  vancomycin (VANCOCIN) IVPB 1000 mg/200 mL premix  Status:  Discontinued     1,000 mg 200 mL/hr over 60 Minutes Intravenous On call to O.R. 02/18/16 1017 02/18/16 1239   02/18/16 1400  ceFAZolin (ANCEF) IVPB 1 g/50 mL premix     1 g 100 mL/hr over 30 Minutes Intravenous  Once 02/18/16 1236     02/18/16 1000  rifampin (RIFADIN) capsule 300 mg     300 mg Oral Every 12 hours 02/18/16 0938     02/17/16 1800  ceFAZolin (ANCEF) IVPB 2g/100 mL premix  Status:  Discontinued     2 g 200 mL/hr over 30 Minutes Intravenous Every M-W-F (1800) 02/16/16 1342 02/18/16 1236   02/17/16 1315  vancomycin (VANCOCIN) 1-5 GM/200ML-% IVPB    Comments:  Elliott, [name]   : cabinet override      02/17/16 1315 02/18/16 0129   02/17/16 1200  vancomycin (VANCOCIN) IVPB 1000 mg/200 mL premix  Status:  Discontinued     1,000 mg 200 mL/hr over 60 Minutes Intravenous Every M-W-F (Hemodialysis) 02/16/16 0731 02/16/16 1317   02/17/16 1200  vancomycin (VANCOCIN) IVPB 1000 mg/200 mL premix  Status:  Discontinued     1,000 mg 200 mL/hr over 60 Minutes Intravenous On call to O.R. 02/16/16 2094 02/16/16 0857   02/17/16 1200  ceFAZolin (ANCEF) IVPB 2g/100 mL premix  Status:  Discontinued     2 g 200 mL/hr over 30 Minutes Intravenous Every M-W-F (Hemodialysis) 02/16/16 1324 02/16/16 1342   02/16/16 0800  ceFAZolin (ANCEF) IVPB 1 g/50 mL premix  Status:  Discontinued     1 g 100 mL/hr over 30 Minutes Intravenous Every 24 hours 02/16/16 0653 02/16/16 0724   02/16/16 0745  vancomycin (VANCOCIN) 2,000 mg  in sodium chloride 0.9 % 500 mL IVPB     2,000 mg 250 mL/hr over 120 Minutes Intravenous  Once 02/16/16 0731 02/16/16 1052      Medications: Scheduled Meds: . aspirin EC  81 mg Oral Daily  .  ceFAZolin (ANCEF) IV  1 g Intravenous Once  . [START ON 02/19/2016]  ceFAZolin (ANCEF) IV  1 g Intravenous Q24H  . [START ON 02/22/2016]  ceFAZolin (ANCEF) IV  2 g Intravenous Q M,W,F-1800  . cinacalcet  60 mg Oral QPM  . heparin  5,000 Units Subcutaneous Q8H  . ipratropium  2 spray Each Nare QID  . methocarbamol  500 mg Oral BID  . midodrine  10 mg Oral BID WC  . multivitamin  1 tablet Oral QHS  . pantoprazole  40 mg Intravenous Q12H  . rifampin  300 mg Oral Q12H  . sevelamer carbonate  1,600 mg Oral TID WC  . sodium chloride  500 mL Intravenous Once   Continuous Infusions: PRN Meds:.acetaminophen **OR** acetaminophen, albuterol, famotidine, morphine injection, ondansetron (ZOFRAN) IV, oxyCODONE-acetaminophen    Objective: Weight change: -6 lb 6.3 oz (-2.9 kg)  Intake/Output Summary (Last 24 hours) at 02/18/16 1256 Last data  filed at 02/18/16 0900  Gross per 24 hour  Intake           1372.5 ml  Output              200 ml  Net           1172.5 ml   Blood pressure (!) 100/34, pulse 86, temperature 98.6 F (37 C), temperature source Oral, resp. rate 16, height 5\' 1"  (1.549 m), weight 214 lb 8.1 oz (97.3 kg), SpO2 97 %. Temp:  [97.5 F (36.4 C)-98.7 F (37.1 C)] 98.6 F (37 C) (12/23 0929) Pulse Rate:  [73-86] 86 (12/23 0929) Resp:  [14-25] 16 (12/23 0929) BP: (81-101)/(26-41) 100/34 (12/23 0929) SpO2:  [97 %-100 %] 97 % (12/23 0929)  Physical Exam: General: Alert and awake, being bathed by RN staff    CBC: CBC Latest Ref Rng & Units 02/18/2016 02/17/2016 02/17/2016  WBC 4.0 - 10.5 K/uL 14.9(H) 15.9(H) 16.2(H)  Hemoglobin 12.0 - 15.0 g/dL 8.6(L) 9.2(L) 9.6(L)  Hematocrit 36.0 - 46.0 % 25.9(L) 27.1(L) 29.1(L)  Platelets 150 - 400 K/uL 211 219 260    BMET  Recent Labs   02/16/16 0212 02/17/16 0217  NA 137 136  K 3.2* 3.7  CL 98* 98*  CO2 28 23  GLUCOSE 96 104*  BUN 16 24*  CREATININE 5.63* 7.33*  CALCIUM 8.9 9.5     Liver Panel  No results for input(s): PROT, ALBUMIN, AST, ALT, ALKPHOS, BILITOT, BILIDIR, IBILI in the last 72 hours.     Sedimentation Rate  Recent Labs  02/17/16 0217  ESRSEDRATE 47*   C-Reactive Protein  Recent Labs  02/17/16 0217  CRP 27.3*    Micro Results: Recent Results (from the past 720 hour(s))  Culture, blood (Routine X 2) w Reflex to ID Panel     Status: Abnormal (Preliminary result)   Collection Time: 02/15/16 10:20 AM  Result Value Ref Range Status   Specimen Description BLOOD LEFT HAND  Final   Special Requests IN PEDIATRIC BOTTLE  2CC  Final   Culture  Setup Time   Final    GRAM POSITIVE COCCI IN CLUSTERS IN PEDIATRIC BOTTLE CRITICAL VALUE NOTED.  VALUE IS CONSISTENT WITH PREVIOUSLY REPORTED AND CALLED VALUE.    Culture (A)  Final    STAPHYLOCOCCUS LUGDUNENSIS REPEATING SENSITIVITIES    Report Status PENDING  Incomplete  Culture, blood (Routine X 2) w Reflex to ID Panel     Status: Abnormal (Preliminary result)   Collection Time: 02/15/16 10:27 AM  Result Value Ref Range Status   Specimen Description BLOOD LEFT HAND  Final   Special Requests IN PEDIATRIC BOTTLE  2CC  Final   Culture  Setup Time   Final    GRAM POSITIVE COCCI IN CLUSTERS IN PEDIATRIC BOTTLE CRITICAL RESULT CALLED TO, READ BACK BY AND VERIFIED WITH: VERONDA BRYK,PHARMD @0644  02/16/16 MKELLY,MLT    Culture STAPHYLOCOCCUS LUGDUNENSIS (A)  Final   Report Status PENDING  Incomplete  Blood Culture ID Panel (Reflexed)     Status: Abnormal   Collection Time: 02/15/16 10:27 AM  Result Value Ref Range Status   Enterococcus species NOT DETECTED NOT DETECTED Final   Listeria monocytogenes NOT DETECTED NOT DETECTED Final   Staphylococcus species DETECTED (A) NOT DETECTED Final    Comment: CRITICAL RESULT CALLED TO, READ BACK BY AND  VERIFIED WITH: V BRYK, PHARMD @0644  02/16/16 MKELLY,MLT    Staphylococcus aureus NOT DETECTED NOT DETECTED Final   Methicillin resistance NOT DETECTED NOT  DETECTED Final   Streptococcus species NOT DETECTED NOT DETECTED Final   Streptococcus agalactiae NOT DETECTED NOT DETECTED Final   Streptococcus pneumoniae NOT DETECTED NOT DETECTED Final   Streptococcus pyogenes NOT DETECTED NOT DETECTED Final   Acinetobacter baumannii NOT DETECTED NOT DETECTED Final   Enterobacteriaceae species NOT DETECTED NOT DETECTED Final   Enterobacter cloacae complex NOT DETECTED NOT DETECTED Final   Escherichia coli NOT DETECTED NOT DETECTED Final   Klebsiella oxytoca NOT DETECTED NOT DETECTED Final   Klebsiella pneumoniae NOT DETECTED NOT DETECTED Final   Proteus species NOT DETECTED NOT DETECTED Final   Serratia marcescens NOT DETECTED NOT DETECTED Final   Haemophilus influenzae NOT DETECTED NOT DETECTED Final   Neisseria meningitidis NOT DETECTED NOT DETECTED Final   Pseudomonas aeruginosa NOT DETECTED NOT DETECTED Final   Candida albicans NOT DETECTED NOT DETECTED Final   Candida glabrata NOT DETECTED NOT DETECTED Final   Candida krusei NOT DETECTED NOT DETECTED Final   Candida parapsilosis NOT DETECTED NOT DETECTED Final   Candida tropicalis NOT DETECTED NOT DETECTED Final  MRSA culture     Status: None   Collection Time: 02/16/16  5:37 AM  Result Value Ref Range Status   Specimen Description NASOPHARYNGEAL  Final   Special Requests NONE  Final   Culture NO MRSA DETECTED  Final   Report Status 02/18/2016 FINAL  Final  Surgical pcr screen     Status: None   Collection Time: 02/16/16  1:30 PM  Result Value Ref Range Status   MRSA, PCR NEGATIVE NEGATIVE Final   Staphylococcus aureus NEGATIVE NEGATIVE Final    Comment:        The Xpert SA Assay (FDA approved for NASAL specimens in patients over 60 years of age), is one component of a comprehensive surveillance program.  Test performance  has been validated by [facility] for patients greater than or equal to 53 year old. It is not intended to diagnose infection nor to guide or monitor treatment.   Aerobic/Anaerobic Culture (surgical/deep wound)     Status: None (Preliminary result)   Collection Time: 02/17/16  3:08 PM  Result Value Ref Range Status   Specimen Description WOUND RIGHT ARM  Final   Special Requests INFECTED AVG GRAFT PT ON ANCEF  Final   Gram Stain   Final    FEW WBC PRESENT,BOTH PMN AND MONONUCLEAR FEW GRAM POSITIVE COCCI IN PAIRS AND CHAINS    Culture TOO YOUNG TO READ  Final   Report Status PENDING  Incomplete    Studies/Results: No results found.    Assessment/Plan:  INTERVAL HISTORY: AV graft resected though some of the graft material could not be removed, transthoracic echocardiogram shows Aortic valve is thickened, predominantly the non-coronary cusp  Repeat blood cultures taken post removal of graft  Active Problems:   Altered mental status   Anemia   Infection of AV graft for dialysis ([place])   Bacteremia   Septic shock (HCC)   Encephalopathy    [name] Marks is a 60 y.o. female withwith  History of 18 years of being on hemodialysis for end-stage renal disease with limited access options now presents with coagulase negative staphylococcal bacteremia due to AV graft infection that apparently has been smoldering for months, now sp removal of graft though with part not   #1 Coagulase negative staphylococcal bacteremia due to AV graft infection sp 1. Removal of infected right upper arm graft2. Bovine pericardial patch angioplasty of right brachial artery 3. Removal of old  right forearm graft. There was a stent still in place that Dr. Scot Dock could not remove because it was incorporated into the vein  Repeat blood cultures after surgery to hopefully prove clearance of bacteremia  Transthoracic echocardiogram has not excluded endocarditis and is infected concerning for  endocarditis  Continue cefazolin with addition of rifampin given persistence of stent  She will need at least 6 weeks of IV antibiotics along with oral rifampin if she can tolerate it.  Hopefully she clears her bacteremia before tomorrow when placement of a dialysis catheter is planned.   LOS: 3 days   Alcide Evener 02/18/2016, 12:56 PM

## 2016-02-18 NOTE — Progress Notes (Signed)
Family Medicine Teaching Service Daily Progress Note Intern Pager: [phone]  Patient name: [name] Marks Medical record number: [id number] Date of birth: 21-Apr-1955 Age: 60 y.o. Gender: female  Primary Care Provider: Lockie Pares, MD Consultants: Nephrology, ID  Code Status: Full   Pt Overview and Major Events to Date:  12/22: removal of infected R arm graft with bovine pericardial patch angioplasty of R brachial artery, and removal of R forearm graft.   Assessment and Plan: [name] Marks is a 60 y.o. female presenting with AMS . PMH is significant for ESRD on HD, complex atypical endometrial hyperplasia   Bacteremia - Patient with 2/2 blood cultures positive for Coagulase negative staphylococcus from AV graft infection. She is s/p removal on 12/22.  WBC 16.2 >15.9>14.9>13.1,  Vitals stable over night. Afebrile.  - Ancef per pharmacy, added rixamin per ID  - repeat blood cultures obtained evening 12/22 after graft removal also growing Staph species, additional blood cultures obtained 12/23, blood cultures should be ordered 12/24 after placement of tunneled HD cath per ID - TTE with thickened aortic valve, can't rule out vegetation, needs TEE - Per Infectious disease,  6 week course of IV ancef and rixamin - wound care to follow up  - Incentive spirometry to improve air movement post-operatively.  ESRD on MWF schedule  - HD on 12/22. - graft removed 12/22. - tunneled HD catheter scheduled to be placed (12/24) - follow renal function with dialysis  - nephrology following  Anemia - Baseline Hgb 11. Has had 4units pRBCs total this admission, last 12/22.  Hgb drop likely due to fistula bleeding. Hgm 9.7>9.2>8.6>8.2.Marland Kitchen  - monitor CBC  AMS, resolved. Likely 2/2 to bacteremia from AV graft infection, resolved. On admit head CT/MRI without acute findings   Complex Atypical Endometrial Hyperplasia - continue Megace  Disposition: SNF per PT recs   Subjective:  Patient feels  moderately well today. Having some lower back pain.   Objective: Temp:  [97.4 F (36.3 C)-98.7 F (37.1 C)] 98.2 F (36.8 C) (12/23 2130) Pulse Rate:  [75-86] 76 (12/23 2130) Resp:  [14-18] 18 (12/23 2130) BP: (91-107)/(30-41) 107/32 (12/23 2130) SpO2:  [96 %-100 %] 99 % (12/23 2130) Weight:  [216 lb 7.9 oz (98.2 kg)] 216 lb 7.9 oz (98.2 kg) (12/23 2130) Physical Exam: General: Sitting on edge of bed in NAD  Cardiovascular: RRR, no murmurs. No pitting edema. No JVD.  Respiratory: CTAB, normal WOB. Intermittent cough with deep inspiration Abdomen: BS+, soft, non-distended, non-tender.  Extremities: Right upper extremity completely wrapped in ACE bandage, not taken down. Neuro: alert and oriented to person, place and time.   Laboratory:  Recent Labs Lab 02/17/16 0217 02/17/16 2234 02/18/16 0621  WBC 16.2* 15.9* 14.9*  HGB 9.6* 9.2* 8.6*  HCT 29.1* 27.1* 25.9*  PLT 260 219 211    Recent Labs Lab 02/15/16 0822 02/15/16 1812 02/16/16 0212 02/17/16 0217  NA 137  --  137 136  K 2.8*  --  3.2* 3.7  CL 92*  --  98* 98*  CO2 28  --  28 23  BUN 18  --  16 24*  CREATININE 7.08* 7.90* 5.63* 7.33*  CALCIUM 10.0  --  8.9 9.5  PROT 6.8  --   --   --   BILITOT 1.2  --   --   --   ALKPHOS 116  --   --   --   ALT 15  --   --   --  AST 23  --   --   --   GLUCOSE 102*  --  96 104*    Imaging/Diagnostic Tests: No results found.  Sela Hilding, MD 02/18/2016, 10:15 PM PGY-1, [place] Intern pager: [phone], text pages welcome

## 2016-02-18 NOTE — Progress Notes (Signed)
   VASCULAR SURGERY ASSESSMENT & PLAN:  1 Day Post-Op s/p: removal of infected grafts right arm.  Cont IV ANCEF  Plan placement of tunneled dialysis catheter tomorrow.   SUBJECTIVE: No complaints  PHYSICAL EXAM: Vitals:   02/17/16 1850 02/17/16 1855 02/17/16 2135 02/18/16 0541  BP: (!) 90/38 (!) 97/35 (!) 101/31 (!) 91/36  Pulse: 77 77 77 85  Resp: (!) 23 (!) 23 16 16   Temp:  97.5 F (36.4 C) 97.7 F (36.5 C) 98.1 F (36.7 C)  TempSrc:   Oral Oral  SpO2: 100% 99% 99% 100%  Weight:      Height:       Dressing changed.  Minimal drainage.   LABS: Lab Results  Component Value Date   WBC 14.9 (H) 02/18/2016   HGB 8.6 (L) 02/18/2016   HCT 25.9 (L) 02/18/2016   MCV 81.4 02/18/2016   PLT 211 02/18/2016   Lab Results  Component Value Date   CREATININE 7.33 (H) 02/17/2016   Lab Results  Component Value Date   INR 1.16 02/15/2016   CBG (last 3)   Recent Labs  02/15/16 0923  GLUCAP 94    Active Problems:   Altered mental status   Anemia   Infection of AV graft for dialysis ([place])   Bacteremia   Septic shock [facility])   Encephalopathy    Gae Gallop Beeper: [phone] 02/18/2016

## 2016-02-18 NOTE — Progress Notes (Signed)
Pharmacy Antibiotic Note  [name] Marks is a 60 y.o. female admitted on 02/15/2016 with bacteremia and AV fistula graft infection.  Pharmacy has been consulted for cefazolin dosing.  Graft s/p removal yestrday with plans to place tunneled HD catheter tomorrow with HD after for holiday schedule (will get HD Sunday, Wednesday, Friday this week). Patient missed cefazolin dose yesterday.   Plan: -Cefazolin 1g IV q24h x4 doses (12/23-12/26), then resume Cefazolin 2g qMWF @ 1800 on 12/27 -Rifampin 300mg  PO q12h per ID -Follow up repeat cultures, TEE and ID plans -Follow HD schedule/tolerance  Height: 5\' 1"  (154.9 cm) Weight: 214 lb 8.1 oz (97.3 kg) IBW/kg (Calculated) : 47.8  Temp (24hrs), Avg:98.2 F (36.8 C), Min:97.5 F (36.4 C), Max:98.7 F (37.1 C)   Recent Labs Lab 02/15/16 0822 02/15/16 1107 02/15/16 1808 02/15/16 1812 02/16/16 0212 02/16/16 1546 02/17/16 0217 02/17/16 2234 02/18/16 0621  WBC 14.4*  --   --  13.3* 15.8* 16.1* 16.2* 15.9* 14.9*  CREATININE 7.08*  --   --  7.90* 5.63*  --  7.33*  --   --   LATICACIDVEN  --  2.06* 1.3  --   --   --   --   --   --     Estimated Creatinine Clearance: 8.6 mL/min (by C-G formula based on SCr of 7.33 mg/dL (H)).    Allergies  Allergen Reactions  . Contrast Media [Iodinated Diagnostic Agents] Anaphylaxis  . Ancef [Cefazolin Sodium] Nausea And Vomiting  . Naproxen Sodium Itching    Antimicrobials this admission:  Vanc 12/21 >>12/22 *schedule vanc d/c'd, but received intra-op vanc 12/22 Cefazolin 12/21>> *missed dose on 12/22  Dose adjustments this admission: 12/23- adjusted Ancef dose/schedule d/t missed dose and off-schedule HD  Microbiology results:  12/20 BCx:Gm positive cocci in clusters 2/2 (BCID: Staphylococcus species; No MecA detected) 12/20 BCx: staph lugdenensis, repeating sens 12/21 MRSA Culture: negative 12/21 MRSA PCR: negative  12/22 Graft: sent 12/23 Bcx: sent  Thank you for allowing pharmacy  to be a part of this patient's care.  Renia Mikelson D. Cina Klumpp, PharmD, BCPS Clinical Pharmacist Pager: [phone] 02/18/2016 12:47 PM

## 2016-02-18 NOTE — Progress Notes (Signed)
  Echocardiogram 2D Echocardiogram has been performed.  Tresa Res 02/18/2016, 11:31 AM

## 2016-02-18 NOTE — Progress Notes (Signed)
[facility] Progress Note   Subjective: Seen in room. No CP, c/o mild chest congestion but no overt dyspnea. C/o occ "hot flashes", afebrile currently. S/p resection of RUE AVG 12/22, no pain currently.     Objective Vitals:   02/17/16 1855 02/17/16 2135 02/18/16 0541 02/18/16 0841  BP: (!) 97/35 (!) 101/31 (!) 91/36 (!) 99/41  Pulse: 77 77 85 83  Resp: (!) 23 16 16 14   Temp: 97.5 F (36.4 C) 97.7 F (36.5 C) 98.1 F (36.7 C) 98.7 F (37.1 C)  TempSrc:  Oral Oral Oral  SpO2: 99% 99% 100% 100%  Weight:      Height:       Physical Exam General: Well appearing female, NAD. Heart: RRR; 2/6 systolic murmur Lungs: CTA anteriorly. Abdomen:soft, non-tender Extremities: No LE edema. Dialysis Access: none currently.Bandaged R arm s/p AVG resection.  Additional Objective Labs: Basic Metabolic Panel:  Recent Labs Lab 02/15/16 0822 02/15/16 1812 02/16/16 0212 02/17/16 0217  NA 137  --  137 136  K 2.8*  --  3.2* 3.7  CL 92*  --  98* 98*  CO2 28  --  28 23  GLUCOSE 102*  --  96 104*  BUN 18  --  16 24*  CREATININE 7.08* 7.90* 5.63* 7.33*  CALCIUM 10.0  --  8.9 9.5   Liver Function Tests:  Recent Labs Lab 02/15/16 0822  AST 23  ALT 15  ALKPHOS 116  BILITOT 1.2  PROT 6.8  ALBUMIN 2.6*   CBC:  Recent Labs Lab 02/16/16 0212 02/16/16 1546 02/17/16 0217 02/17/16 2234 02/18/16 0621  WBC 15.8* 16.1* 16.2* 15.9* 14.9*  HGB 7.5* 7.1* 9.6* 9.2* 8.6*  HCT 23.2* 22.3* 29.1* 27.1* 25.9*  MCV 83.5 83.2 81.1 80.4 81.4  PLT 300 287 260 219 211   Blood Culture    Component Value Date/Time   SDES WOUND RIGHT ARM 02/17/2016 1508   SPECREQUEST INFECTED AVG GRAFT PT ON ANCEF 02/17/2016 1508   CULT PENDING 02/17/2016 1508   REPTSTATUS PENDING 02/17/2016 1508   CBG:  Recent Labs Lab 02/15/16 0923  GLUCAP 94   Iron Studies:  Recent Labs  02/16/16 1213  IRON 16*  TIBC 99*  FERRITIN 942*   Medications: . pantoprozole (PROTONIX) infusion 8  mg/hr (02/17/16 2203)   . aspirin EC  81 mg Oral Daily  .  ceFAZolin (ANCEF) IV  2 g Intravenous Q M,W,F-1800  . cinacalcet  60 mg Oral QPM  . heparin  5,000 Units Subcutaneous Q8H  . ipratropium  2 spray Each Nare QID  . megestrol  40 mg Oral BID  . methocarbamol  500 mg Oral BID  . midodrine  10 mg Oral BID WC  . multivitamin  1 tablet Oral QHS  . pantoprazole  40 mg Intravenous Q12H  . sevelamer carbonate  1,600 mg Oral TID WC  . sodium chloride  500 mL Intravenous Once    Dialysis Orders: MWF at [facility] 4 hours, 180 dialyzer, BFR 400/DFR800, EDW 99.5kg, 2K/2Ca, AVG - Heparin 6000 unit bolus + 2500unit mid-run bolus q HD - Mircera 238mcg IV q 2 weeks (last given 12/13) - Calcitriol 0.24mcg PO q HD  Assessment/Plan: 1. AMS: On admit, head CT/MRI without acute findings. Now seems clearly related to infection, resolved. 2. Staph bacteremia (with infected RUE AVG): Now s/p RUE AVG resection 12/22 by Dr. Scot Dock. BCx 12/20 grew Staph Lugdunensis, sensitivities pending. Now changed from [place] with HD x 6  weeks. 3. ESRD: MWF schedule, next HD 12/24 ([facility] HD schedule Sun, W, F this week). VVS will place tunneled PC in AM prior to dialysis. No heparin. 4. HTN/volume: On midodrine. Well below her EDW (~97kg). UF as tolerated with HD. 5. Anemia: Has had recent vaginal and AVG bleeding. Hgb 8.6, s/p 2U PRBCs 12/20, 1U 12/21, 1U 12/22. Not due to ESA yet. Tsat low, but holding off on IV iron in setting of bacteremia. Monitor. 6. Secondary hyperparathyroidism: Ca ok. Continue binders/VDRA/sensipar.  7. Nutrition: Known complex endometrial hyperplasia. Alb 2.6. ?On Megace.  Veneta Penton, PA-C 02/18/2016, 9:29 AM  [facility] Pager: [phone]  Renal Attending: It is disappointing mental status has not improved more rapidly, suggesting possible underlying CNS issue.  Will follow for resolution. I agree with note above. Would stop megace until ok'd  by GYN Diondra Pines C

## 2016-02-19 ENCOUNTER — Inpatient Hospital Stay (HOSPITAL_COMMUNITY): Payer: Medicare Other | Admitting: Certified Registered Nurse Anesthetist

## 2016-02-19 ENCOUNTER — Encounter (HOSPITAL_COMMUNITY)
Admission: EM | Disposition: A | Discharge status: Skilled Nursing Facility | Payer: Self-pay | Source: Home / Self Care | Attending: Family Medicine

## 2016-02-19 ENCOUNTER — Inpatient Hospital Stay (HOSPITAL_COMMUNITY): Payer: Medicare Other

## 2016-02-19 DIAGNOSIS — Z8679 Personal history of other diseases of the circulatory system: Secondary | ICD-10-CM

## 2016-02-19 DIAGNOSIS — B9561 Methicillin susceptible Staphylococcus aureus infection as the cause of diseases classified elsewhere: Secondary | ICD-10-CM

## 2016-02-19 DIAGNOSIS — M542 Cervicalgia: Secondary | ICD-10-CM

## 2016-02-19 DIAGNOSIS — Z992 Dependence on renal dialysis: Secondary | ICD-10-CM

## 2016-02-19 DIAGNOSIS — N186 End stage renal disease: Secondary | ICD-10-CM

## 2016-02-19 DIAGNOSIS — Z95828 Presence of other vascular implants and grafts: Secondary | ICD-10-CM

## 2016-02-19 DIAGNOSIS — M5440 Lumbago with sciatica, unspecified side: Secondary | ICD-10-CM

## 2016-02-19 DIAGNOSIS — M545 Low back pain: Secondary | ICD-10-CM

## 2016-02-19 DIAGNOSIS — R7881 Bacteremia: Secondary | ICD-10-CM

## 2016-02-19 HISTORY — PX: INSERTION OF DIALYSIS CATHETER: SHX1324

## 2016-02-19 LAB — BLOOD CULTURE ID PANEL (REFLEXED)
ACINETOBACTER BAUMANNII: NOT DETECTED
CANDIDA ALBICANS: NOT DETECTED
CANDIDA GLABRATA: NOT DETECTED
CANDIDA KRUSEI: NOT DETECTED
CANDIDA PARAPSILOSIS: NOT DETECTED
Candida tropicalis: NOT DETECTED
ENTEROBACTER CLOACAE COMPLEX: NOT DETECTED
ENTEROBACTERIACEAE SPECIES: NOT DETECTED
ESCHERICHIA COLI: NOT DETECTED
Enterococcus species: NOT DETECTED
Haemophilus influenzae: NOT DETECTED
KLEBSIELLA OXYTOCA: NOT DETECTED
Klebsiella pneumoniae: NOT DETECTED
Listeria monocytogenes: NOT DETECTED
Methicillin resistance: NOT DETECTED
Neisseria meningitidis: NOT DETECTED
PSEUDOMONAS AERUGINOSA: NOT DETECTED
Proteus species: NOT DETECTED
SERRATIA MARCESCENS: NOT DETECTED
STREPTOCOCCUS PNEUMONIAE: NOT DETECTED
Staphylococcus aureus (BCID): NOT DETECTED
Staphylococcus species: DETECTED — AB
Streptococcus agalactiae: NOT DETECTED
Streptococcus pyogenes: NOT DETECTED
Streptococcus species: NOT DETECTED

## 2016-02-19 LAB — CULTURE, BLOOD (ROUTINE X 2)

## 2016-02-19 LAB — BASIC METABOLIC PANEL
Anion gap: 11 (ref 5–15)
BUN: 24 mg/dL — ABNORMAL HIGH (ref 6–20)
CHLORIDE: 96 mmol/L — AB (ref 101–111)
CO2: 27 mmol/L (ref 22–32)
CREATININE: 5.97 mg/dL — AB (ref 0.44–1.00)
Calcium: 9.2 mg/dL (ref 8.9–10.3)
GFR calc non Af Amer: 7 mL/min — ABNORMAL LOW (ref >60)
GFR, EST AFRICAN AMERICAN: 8 mL/min — AB (ref >60)
Glucose, Bld: 101 mg/dL — ABNORMAL HIGH (ref 65–99)
Potassium: 3.2 mmol/L — ABNORMAL LOW (ref 3.5–5.1)
Sodium: 134 mmol/L — ABNORMAL LOW (ref 135–145)

## 2016-02-19 LAB — CBC
HEMATOCRIT: 25.1 % — AB (ref 36.0–46.0)
HEMOGLOBIN: 8.2 g/dL — AB (ref 12.0–15.0)
MCH: 27.1 pg (ref 26.0–34.0)
MCHC: 32.7 g/dL (ref 30.0–36.0)
MCV: 82.8 fL (ref 78.0–100.0)
Platelets: 250 10*3/uL (ref 150–400)
RBC: 3.03 MIL/uL — ABNORMAL LOW (ref 3.87–5.11)
RDW: 18.8 % — ABNORMAL HIGH (ref 11.5–15.5)
WBC: 13.1 10*3/uL — ABNORMAL HIGH (ref 4.0–10.5)

## 2016-02-19 SURGERY — INSERTION OF DIALYSIS CATHETER
Anesthesia: Monitor Anesthesia Care | Site: Neck

## 2016-02-19 MED ORDER — PROPOFOL 500 MG/50ML IV EMUL
INTRAVENOUS | Status: DC | PRN
  Administered 2016-02-19: 75 ug/kg/min via INTRAVENOUS

## 2016-02-19 MED ORDER — SODIUM CHLORIDE 0.9 % IV SOLN
INTRAVENOUS | Status: DC | PRN
  Administered 2016-02-19: 07:00:00

## 2016-02-19 MED ORDER — HEPARIN SODIUM (PORCINE) 1000 UNIT/ML IJ SOLN
INTRAMUSCULAR | Status: DC | PRN
  Administered 2016-02-19: 3.4 mL via INTRAVENOUS

## 2016-02-19 MED ORDER — HEPARIN SODIUM (PORCINE) 1000 UNIT/ML IJ SOLN
INTRAMUSCULAR | Status: AC
  Filled 2016-02-19: qty 1

## 2016-02-19 MED ORDER — 0.9 % SODIUM CHLORIDE (POUR BTL) OPTIME
TOPICAL | Status: DC | PRN
  Administered 2016-02-19: 1000 mL

## 2016-02-19 MED ORDER — FENTANYL CITRATE (PF) 100 MCG/2ML IJ SOLN
INTRAMUSCULAR | Status: DC | PRN
  Administered 2016-02-19: 50 ug via INTRAVENOUS

## 2016-02-19 MED ORDER — ALTEPLASE 2 MG IJ SOLR: 2.0000 mg | Freq: Once | INTRAMUSCULAR | Status: DC | PRN

## 2016-02-19 MED ORDER — PHENYLEPHRINE HCL 10 MG/ML IJ SOLN
INTRAVENOUS | Status: DC | PRN
  Administered 2016-02-19: 10 ug/min via INTRAVENOUS

## 2016-02-19 MED ORDER — PROPOFOL 10 MG/ML IV BOLUS
INTRAVENOUS | Status: AC
  Filled 2016-02-19: qty 40

## 2016-02-19 MED ORDER — FENTANYL CITRATE (PF) 100 MCG/2ML IJ SOLN
INTRAMUSCULAR | Status: AC
  Filled 2016-02-19: qty 2

## 2016-02-19 MED ORDER — LIDOCAINE HCL (PF) 1 % IJ SOLN
INTRAMUSCULAR | Status: AC
  Filled 2016-02-19: qty 30

## 2016-02-19 MED ORDER — LIDOCAINE 2% (20 MG/ML) 5 ML SYRINGE
INTRAMUSCULAR | Status: DC | PRN
  Administered 2016-02-19: 20 mg via INTRAVENOUS

## 2016-02-19 MED ORDER — HEPARIN SODIUM (PORCINE) 1000 UNIT/ML DIALYSIS: 1000.0000 [IU] | INTRAMUSCULAR | Status: DC | PRN

## 2016-02-19 MED ORDER — SODIUM CHLORIDE 0.9 % IV SOLN: 100.0000 mL | INTRAVENOUS | Status: DC | PRN

## 2016-02-19 MED ORDER — OXYCODONE-ACETAMINOPHEN 5-325 MG PO TABS
ORAL_TABLET | ORAL | Status: AC
  Filled 2016-02-19: qty 1

## 2016-02-19 MED ORDER — SODIUM CHLORIDE 0.9 % IV SOLN
INTRAVENOUS | Status: DC | PRN
  Administered 2016-02-19: 07:00:00 via INTRAVENOUS

## 2016-02-19 MED ORDER — LIDOCAINE-EPINEPHRINE (PF) 1 %-1:200000 IJ SOLN
INTRAMUSCULAR | Status: DC | PRN
  Administered 2016-02-19: 24 mL

## 2016-02-19 SURGICAL SUPPLY — 41 items
BAG DECANTER FOR FLEXI CONT (MISCELLANEOUS) ×2 IMPLANT
BIOPATCH RED 1 DISK 7.0 (GAUZE/BANDAGES/DRESSINGS) ×2 IMPLANT
CATH ANGIO 5F BER2 65CM (CATHETERS) ×2 IMPLANT
CATH PALINDROME RT-P 15FX19CM (CATHETERS) IMPLANT
CATH PALINDROME RT-P 15FX23CM (CATHETERS) ×2 IMPLANT
CATH PALINDROME RT-P 15FX28CM (CATHETERS) IMPLANT
CATH PALINDROME RT-P 15FX55CM (CATHETERS) IMPLANT
CHLORAPREP W/TINT 26ML (MISCELLANEOUS) ×2 IMPLANT
COVER PROBE W GEL 5X96 (DRAPES) IMPLANT
COVER SURGICAL LIGHT HANDLE (MISCELLANEOUS) ×2 IMPLANT
DRAPE C-ARM 42X72 X-RAY (DRAPES) ×2 IMPLANT
DRAPE CHEST BREAST 15X10 FENES (DRAPES) ×2 IMPLANT
GAUZE SPONGE 2X2 8PLY STRL LF (GAUZE/BANDAGES/DRESSINGS) ×1 IMPLANT
GAUZE SPONGE 4X4 16PLY XRAY LF (GAUZE/BANDAGES/DRESSINGS) ×2 IMPLANT
GLOVE BIO SURGEON STRL SZ7.5 (GLOVE) ×2 IMPLANT
GLOVE BIOGEL PI IND STRL 8 (GLOVE) ×1 IMPLANT
GLOVE BIOGEL PI INDICATOR 8 (GLOVE) ×1
GOWN STRL REUS W/ TWL LRG LVL3 (GOWN DISPOSABLE) ×2 IMPLANT
GOWN STRL REUS W/TWL LRG LVL3 (GOWN DISPOSABLE) ×2
KIT BASIN OR (CUSTOM PROCEDURE TRAY) ×2 IMPLANT
KIT ROOM TURNOVER OR (KITS) ×2 IMPLANT
NEEDLE 18GX1X1/2 (RX/OR ONLY) (NEEDLE) ×2 IMPLANT
NEEDLE 22X1 1/2 (OR ONLY) (NEEDLE) ×2 IMPLANT
NEEDLE HYPO 25GX1X1/2 BEV (NEEDLE) ×2 IMPLANT
NS IRRIG 1000ML POUR BTL (IV SOLUTION) ×2 IMPLANT
PACK SURGICAL SETUP 50X90 (CUSTOM PROCEDURE TRAY) ×2 IMPLANT
PAD ARMBOARD 7.5X6 YLW CONV (MISCELLANEOUS) ×4 IMPLANT
SET MICROPUNCTURE 5F STIFF (MISCELLANEOUS) ×2 IMPLANT
SPONGE GAUZE 2X2 STER 10/PKG (GAUZE/BANDAGES/DRESSINGS) ×1
SUT ETHILON 3 0 PS 1 (SUTURE) ×2 IMPLANT
SUT VICRYL 4-0 PS2 18IN ABS (SUTURE) ×4 IMPLANT
SYR 20CC LL (SYRINGE) ×4 IMPLANT
SYR 5ML LL (SYRINGE) ×6 IMPLANT
SYR CONTROL 10ML LL (SYRINGE) ×2 IMPLANT
SYRINGE 10CC LL (SYRINGE) ×2 IMPLANT
SYRINGE 20CC LL (MISCELLANEOUS) ×2 IMPLANT
TAPE CLOTH SURG 4X10 WHT LF (GAUZE/BANDAGES/DRESSINGS) ×2 IMPLANT
TOWEL OR 17X24 6PK STRL BLUE (TOWEL DISPOSABLE) ×2 IMPLANT
TOWEL OR 17X26 10 PK STRL BLUE (TOWEL DISPOSABLE) ×2 IMPLANT
WATER STERILE IRR 1000ML POUR (IV SOLUTION) ×2 IMPLANT
WIRE AMPLATZ SS-J .035X180CM (WIRE) ×2 IMPLANT

## 2016-02-19 NOTE — Transfer of Care (Signed)
Immediate Anesthesia Transfer of Care Note  Patient: [name] Marks  Procedure(s) Performed: Procedure(s): INSERTION OF Left Internal Jugular DIALYSIS CATHETER (N/A)  Patient Location: PACU  Anesthesia Type:MAC  Level of Consciousness: awake, alert , oriented and patient cooperative  Airway & Oxygen Therapy: Patient Spontanous Breathing and Patient connected to nasal cannula oxygen  Post-op Assessment: Report given to RN, Post -op Vital signs reviewed and stable and Patient moving all extremities  Post vital signs: Reviewed and stable  Last Vitals:  Vitals:   02/18/16 2130 02/19/16 0616  BP: (!) 107/32 (!) 119/28  Pulse: 76 78  Resp: 18 18  Temp: 36.8 C 36.7 C    Last Pain:  Vitals:   02/19/16 0616  TempSrc: Oral  PainSc:       Patients Stated Pain Goal: 4 (11/73/56 7014)  Complications: No apparent anesthesia complications

## 2016-02-19 NOTE — Op Note (Signed)
    NAME: [name] Marks   MRN: [id number] DOB: Apr 17, 1955    DATE OF OPERATION: 02/19/2016  PREOP DIAGNOSIS: Status post removal of right upper arm AV graft  POSTOP DIAGNOSIS: Same  PROCEDURE: Ultrasound-guided placement of left IJ 28 cm tunneled dialysis catheter  SURGEON: Judeth Cornfield. Scot Dock, MD, FACS  ASSIST: None  ANESTHESIA: Local with sedation   EBL: Minimal  INDICATIONS: [name] Marks is a 60 y.o. female who had an infected right upper arm graft removed 2 days ago. She presents for a catheter placement for dialysis until she can be evaluated for new access.  FINDINGS: She had extensive collaterals in both sides of her neck suggesting central venous occlusion. I attempted to cannulate the right IJ but the patient had occlusion distally and therefore I went to the left side. I was able to get a catheter in from the left side.  TECHNIQUE: The patient was taken to the operative room and sedated by anesthesia. After careful positioning, the neck was prepped and draped in usual sterile fashion. On the right side, there were multiple dilated veins suggesting a central venous occlusion however this was similar on both sides. Therefore started on the right side. Under a sound guidance, after the skin was anesthetized, I cannulated one of the larger veins but the wire would not thread despite multiple attempts. The patient has an allergy and therefore I did not want to give IV contrast. I therefore went to the left side.  On the left side, under ultrasound guidance, after the skin was anesthetized, the largest vein was cannulated and a guidewire used into the left brachiocephalic vein. The guidewire would not advance into the superior vena cava and therefore I used a Berenstein to catheter in order to direct the wire into the SVC. I then advanced the catheter over the wire and exchanged the J-wire for an Amplatz wire. The tract over the Amplatz wire was dilated and then the dilator and  peel-away sheath were advanced over the wire. The dilator was removed. The 28 cm catheter was threaded over the wire through the peel-away sheath down into the right atrium and the wire and peel-away sheath were then removed. The exit site for the catheter was then selected and the skin anesthetized between the 2 areas. The catheter was then brought to the tunnel cut the appropriate length and distal ports were attached. Both ports withdrew easily within flushed with heparinized saline filled with concentrated heparin. The catheter was secured at its exit site with a 3-0 nylon suture. The IJ cannulation site was closed with a 4-0 subcuticular stitch. Sterile dressing was applied. Patient tolerated the procedure well and was transferred to the recovery room in stable condition. All needle and sponge counts were correct.  [name], MD, FACS [facility]  DATE OF DICTATION:   02/19/2016

## 2016-02-19 NOTE — Procedures (Signed)
Had HD cath inserted today. Catheter working well. Remains confused, but better. No hemodynamic instability. Koral Thaden C

## 2016-02-19 NOTE — Anesthesia Postprocedure Evaluation (Signed)
Anesthesia Post Note  Patient: Kimberlye A [name]  Procedure(s) Performed: Procedure(s) (LRB): INSERTION OF Left Internal Jugular DIALYSIS CATHETER (N/A)  Patient location during evaluation: PACU Anesthesia Type: MAC Level of consciousness: awake and alert Pain management: pain level controlled Vital Signs Assessment: post-procedure vital signs reviewed and stable Respiratory status: spontaneous breathing, nonlabored ventilation, respiratory function stable and patient connected to nasal cannula oxygen Cardiovascular status: stable and blood pressure returned to baseline Anesthetic complications: no       Last Vitals:  Vitals:   02/19/16 0830 02/19/16 0846  BP: (!) 112/29 (!) 111/41  Pulse: 73 72  Resp: 11 (!) 24  Temp: 36.4 C 36.6 C    Last Pain:  Vitals:   02/19/16 0616  TempSrc: Oral  PainSc:                  Chrysten Woulfe,W. EDMOND

## 2016-02-19 NOTE — H&P (View-Only) (Signed)
   VASCULAR SURGERY ASSESSMENT & PLAN:  1 Day Post-Op s/p: removal of infected grafts right arm.  Cont IV ANCEF  Plan placement of tunneled dialysis catheter tomorrow.   SUBJECTIVE: No complaints  PHYSICAL EXAM: Vitals:   02/17/16 1850 02/17/16 1855 02/17/16 2135 02/18/16 0541  BP: (!) 90/38 (!) 97/35 (!) 101/31 (!) 91/36  Pulse: 77 77 77 85  Resp: (!) 23 (!) 23 16 16   Temp:  97.5 F (36.4 C) 97.7 F (36.5 C) 98.1 F (36.7 C)  TempSrc:   Oral Oral  SpO2: 100% 99% 99% 100%  Weight:      Height:       Dressing changed.  Minimal drainage.   LABS: Lab Results  Component Value Date   WBC 14.9 (H) 02/18/2016   HGB 8.6 (L) 02/18/2016   HCT 25.9 (L) 02/18/2016   MCV 81.4 02/18/2016   PLT 211 02/18/2016   Lab Results  Component Value Date   CREATININE 7.33 (H) 02/17/2016   Lab Results  Component Value Date   INR 1.16 02/15/2016   CBG (last 3)   Recent Labs  02/15/16 0923  GLUCAP 94    Active Problems:   Altered mental status   Anemia   Infection of AV graft for dialysis ([place])   Bacteremia   Septic shock [facility])   Encephalopathy    Gae Gallop Beeper: [phone] 02/18/2016

## 2016-02-19 NOTE — Progress Notes (Signed)
Blood pressure noted to be low tonight. Patient is asymptomatic. Dialysis early in the evening per patient. Due to hx of bleeding and recent transfusions. Will obtain a CBC tonight.

## 2016-02-19 NOTE — Progress Notes (Signed)
PHARMACY - PHYSICIAN COMMUNICATION CRITICAL VALUE ALERT - BLOOD CULTURE IDENTIFICATION (BCID)  Results for orders placed or performed during the hospital encounter of 02/15/16  Blood Culture ID Panel (Reflexed) (Collected: 02/17/2016 10:30 PM)  Result Value Ref Range   Enterococcus species NOT DETECTED NOT DETECTED   Listeria monocytogenes NOT DETECTED NOT DETECTED   Staphylococcus species DETECTED (A) NOT DETECTED   Staphylococcus aureus NOT DETECTED NOT DETECTED   Methicillin resistance NOT DETECTED NOT DETECTED   Streptococcus species NOT DETECTED NOT DETECTED   Streptococcus agalactiae NOT DETECTED NOT DETECTED   Streptococcus pneumoniae NOT DETECTED NOT DETECTED   Streptococcus pyogenes NOT DETECTED NOT DETECTED   Acinetobacter baumannii NOT DETECTED NOT DETECTED   Enterobacteriaceae species NOT DETECTED NOT DETECTED   Enterobacter cloacae complex NOT DETECTED NOT DETECTED   Escherichia coli NOT DETECTED NOT DETECTED   Klebsiella oxytoca NOT DETECTED NOT DETECTED   Klebsiella pneumoniae NOT DETECTED NOT DETECTED   Proteus species NOT DETECTED NOT DETECTED   Serratia marcescens NOT DETECTED NOT DETECTED   Haemophilus influenzae NOT DETECTED NOT DETECTED   Neisseria meningitidis NOT DETECTED NOT DETECTED   Pseudomonas aeruginosa NOT DETECTED NOT DETECTED   Candida albicans NOT DETECTED NOT DETECTED   Candida glabrata NOT DETECTED NOT DETECTED   Candida krusei NOT DETECTED NOT DETECTED   Candida parapsilosis NOT DETECTED NOT DETECTED   Candida tropicalis NOT DETECTED NOT DETECTED    Name of physician (or Provider) Contacted: [facility]   Changes to prescribed antibiotics required: Pt already with known coagulase neg staph bacteremia, ID following, continue Ancef/Rifampin for now  Narda Bonds 02/19/2016  2:19 AM

## 2016-02-19 NOTE — Progress Notes (Signed)
Subjective:  Is complaining of new neck pain at the site of her hemodialysis catheter insertion as well as low back pain which she says she's been having some mid December on   Antibiotics:  Anti-infectives    Start     Dose/Rate Route Frequency Ordered Stop   02/22/16 1800  ceFAZolin (ANCEF) IVPB 2g/100 mL premix     2 g 200 mL/hr over 30 Minutes Intravenous Every M-W-F (1800) 02/18/16 1236     02/19/16 1800  ceFAZolin (ANCEF) IVPB 1 g/50 mL premix     1 g 100 mL/hr over 30 Minutes Intravenous Every 24 hours 02/18/16 1236 02/22/16 1759   02/19/16 0700  vancomycin (VANCOCIN) IVPB 1000 mg/200 mL premix  Status:  Discontinued     1,000 mg 200 mL/hr over 60 Minutes Intravenous On call to O.R. 02/18/16 1017 02/18/16 1239   02/18/16 1400  ceFAZolin (ANCEF) IVPB 1 g/50 mL premix     1 g 100 mL/hr over 30 Minutes Intravenous  Once 02/18/16 1236 02/18/16 1631   02/18/16 1000  rifampin (RIFADIN) capsule 300 mg     300 mg Oral Every 12 hours 02/18/16 0938     02/17/16 1800  ceFAZolin (ANCEF) IVPB 2g/100 mL premix  Status:  Discontinued     2 g 200 mL/hr over 30 Minutes Intravenous Every M-W-F (1800) 02/16/16 1342 02/18/16 1236   02/17/16 1315  vancomycin (VANCOCIN) 1-5 GM/200ML-% IVPB    Comments:  Elliott, [name]   : cabinet override      02/17/16 1315 02/18/16 0129   02/17/16 1200  vancomycin (VANCOCIN) IVPB 1000 mg/200 mL premix  Status:  Discontinued     1,000 mg 200 mL/hr over 60 Minutes Intravenous Every M-W-F (Hemodialysis) 02/16/16 0731 02/16/16 1317   02/17/16 1200  vancomycin (VANCOCIN) IVPB 1000 mg/200 mL premix  Status:  Discontinued     1,000 mg 200 mL/hr over 60 Minutes Intravenous On call to O.R. 02/16/16 3354 02/16/16 0857   02/17/16 1200  ceFAZolin (ANCEF) IVPB 2g/100 mL premix  Status:  Discontinued     2 g 200 mL/hr over 30 Minutes Intravenous Every M-W-F (Hemodialysis) 02/16/16 1324 02/16/16 1342   02/16/16 0800  ceFAZolin (ANCEF) IVPB 1 g/50 mL premix   Status:  Discontinued     1 g 100 mL/hr over 30 Minutes Intravenous Every 24 hours 02/16/16 0653 02/16/16 0724   02/16/16 0745  vancomycin (VANCOCIN) 2,000 mg in sodium chloride 0.9 % 500 mL IVPB     2,000 mg 250 mL/hr over 120 Minutes Intravenous  Once 02/16/16 0731 02/16/16 1052      Medications: Scheduled Meds: . aspirin EC  81 mg Oral Daily  .  ceFAZolin (ANCEF) IV  1 g Intravenous Q24H  . [START ON 02/22/2016]  ceFAZolin (ANCEF) IV  2 g Intravenous Q M,W,F-1800  . cinacalcet  60 mg Oral QPM  . heparin  5,000 Units Subcutaneous Q8H  . ipratropium  2 spray Each Nare QID  . methocarbamol  500 mg Oral BID  . midodrine  10 mg Oral BID WC  . multivitamin  1 tablet Oral QHS  . pantoprazole  40 mg Intravenous Q12H  . rifampin  300 mg Oral Q12H  . sevelamer carbonate  1,600 mg Oral TID WC  . sodium chloride  500 mL Intravenous Once   Continuous Infusions: PRN Meds:.sodium chloride, sodium chloride, acetaminophen **OR** acetaminophen, albuterol, alteplase, famotidine, heparin, morphine injection, ondansetron (ZOFRAN) IV, oxyCODONE-acetaminophen  Objective: Weight change: 1 lb 15.8 oz (0.9 kg)  Intake/Output Summary (Last 24 hours) at 02/19/16 1223 Last data filed at 02/19/16 1039  Gross per 24 hour  Intake              410 ml  Output               15 ml  Net              395 ml   Blood pressure (!) 109/43, pulse 76, temperature 97.3 F (36.3 C), temperature source Oral, resp. rate 13, height 5\' 1"  (1.549 m), weight 213 lb 10 oz (96.9 kg), SpO2 100 %. Temp:  [97.3 F (36.3 C)-98.2 F (36.8 C)] 97.3 F (36.3 C) (12/24 0956) Pulse Rate:  [71-79] 76 (12/24 1200) Resp:  [11-24] 13 (12/24 0956) BP: (100-125)/(28-52) 109/43 (12/24 1200) SpO2:  [96 %-100 %] 100 % (12/24 0956) Weight:  [213 lb 10 oz (96.9 kg)-216 lb 7.9 oz (98.2 kg)] 213 lb 10 oz (96.9 kg) (12/24 0956)  Physical Exam: General: Alert and awake, Oriented having hemodialysis  Cardiovascular regular rate and  rhythm no murmurs gallops or rubs heard  Lungs clear to auscultation anteriorly.  Abdomen nondistended soft positive bowel sounds  Extremities and skin right graft site has antigen.  Neurological no new focal deficits   CBC: CBC Latest Ref Rng & Units 02/19/2016 02/18/2016 02/17/2016  WBC 4.0 - 10.5 K/uL 13.1(H) 14.9(H) 15.9(H)  Hemoglobin 12.0 - 15.0 g/dL 8.2(L) 8.6(L) 9.2(L)  Hematocrit 36.0 - 46.0 % 25.1(L) 25.9(L) 27.1(L)  Platelets 150 - 400 K/uL 250 211 219    BMET  Recent Labs  02/17/16 0217 02/19/16 0445  NA 136 134*  K 3.7 3.2*  CL 98* 96*  CO2 23 27  GLUCOSE 104* 101*  BUN 24* 24*  CREATININE 7.33* 5.97*  CALCIUM 9.5 9.2     Liver Panel  No results for input(s): PROT, ALBUMIN, AST, ALT, ALKPHOS, BILITOT, BILIDIR, IBILI in the last 72 hours.     Sedimentation Rate  Recent Labs  02/17/16 0217  ESRSEDRATE 47*   C-Reactive Protein  Recent Labs  02/17/16 0217  CRP 27.3*    Micro Results: Recent Results (from the past 720 hour(s))  Culture, blood (Routine X 2) w Reflex to ID Panel     Status: Abnormal   Collection Time: 02/15/16 10:20 AM  Result Value Ref Range Status   Specimen Description BLOOD LEFT HAND  Final   Special Requests IN PEDIATRIC BOTTLE  2CC  Final   Culture  Setup Time   Final    GRAM POSITIVE COCCI IN CLUSTERS IN PEDIATRIC BOTTLE CRITICAL VALUE NOTED.  VALUE IS CONSISTENT WITH PREVIOUSLY REPORTED AND CALLED VALUE.    Culture STAPHYLOCOCCUS LUGDUNENSIS (A)  Final   Report Status 02/19/2016 FINAL  Final   Organism ID, Bacteria STAPHYLOCOCCUS LUGDUNENSIS  Final      Susceptibility   Staphylococcus lugdunensis - MIC*    CIPROFLOXACIN 1 SENSITIVE Sensitive     ERYTHROMYCIN <=0.25 SENSITIVE Sensitive     GENTAMICIN <=0.5 SENSITIVE Sensitive     OXACILLIN 0.5 SENSITIVE Sensitive     TETRACYCLINE <=1 SENSITIVE Sensitive     VANCOMYCIN <=0.5 SENSITIVE Sensitive     TRIMETH/SULFA <=10 SENSITIVE Sensitive     CLINDAMYCIN  <=0.25 SENSITIVE Sensitive     RIFAMPIN <=0.5 SENSITIVE Sensitive     Inducible Clindamycin NEGATIVE Sensitive     * STAPHYLOCOCCUS LUGDUNENSIS  Culture, blood (Routine X 2) w  Reflex to ID Panel     Status: Abnormal   Collection Time: 02/15/16 10:27 AM  Result Value Ref Range Status   Specimen Description BLOOD LEFT HAND  Final   Special Requests IN PEDIATRIC BOTTLE  2CC  Final   Culture  Setup Time   Final    GRAM POSITIVE COCCI IN CLUSTERS IN PEDIATRIC BOTTLE CRITICAL RESULT CALLED TO, READ BACK BY AND VERIFIED WITH: VERONDA BRYK,PHARMD @0644  02/16/16 MKELLY,MLT    Culture (A)  Final    STAPHYLOCOCCUS LUGDUNENSIS SUSCEPTIBILITIES PERFORMED ON PREVIOUS CULTURE WITHIN THE LAST 5 DAYS.    Report Status 02/19/2016 FINAL  Final  Blood Culture ID Panel (Reflexed)     Status: Abnormal   Collection Time: 02/15/16 10:27 AM  Result Value Ref Range Status   Enterococcus species NOT DETECTED NOT DETECTED Final   Listeria monocytogenes NOT DETECTED NOT DETECTED Final   Staphylococcus species DETECTED (A) NOT DETECTED Final    Comment: CRITICAL RESULT CALLED TO, READ BACK BY AND VERIFIED WITH: V BRYK, PHARMD @0644  02/16/16 MKELLY,MLT    Staphylococcus aureus NOT DETECTED NOT DETECTED Final   Methicillin resistance NOT DETECTED NOT DETECTED Final   Streptococcus species NOT DETECTED NOT DETECTED Final   Streptococcus agalactiae NOT DETECTED NOT DETECTED Final   Streptococcus pneumoniae NOT DETECTED NOT DETECTED Final   Streptococcus pyogenes NOT DETECTED NOT DETECTED Final   Acinetobacter baumannii NOT DETECTED NOT DETECTED Final   Enterobacteriaceae species NOT DETECTED NOT DETECTED Final   Enterobacter cloacae complex NOT DETECTED NOT DETECTED Final   Escherichia coli NOT DETECTED NOT DETECTED Final   Klebsiella oxytoca NOT DETECTED NOT DETECTED Final   Klebsiella pneumoniae NOT DETECTED NOT DETECTED Final   Proteus species NOT DETECTED NOT DETECTED Final   Serratia marcescens NOT  DETECTED NOT DETECTED Final   Haemophilus influenzae NOT DETECTED NOT DETECTED Final   Neisseria meningitidis NOT DETECTED NOT DETECTED Final   Pseudomonas aeruginosa NOT DETECTED NOT DETECTED Final   Candida albicans NOT DETECTED NOT DETECTED Final   Candida glabrata NOT DETECTED NOT DETECTED Final   Candida krusei NOT DETECTED NOT DETECTED Final   Candida parapsilosis NOT DETECTED NOT DETECTED Final   Candida tropicalis NOT DETECTED NOT DETECTED Final  MRSA culture     Status: None   Collection Time: 02/16/16  5:37 AM  Result Value Ref Range Status   Specimen Description NASOPHARYNGEAL  Final   Special Requests NONE  Final   Culture NO MRSA DETECTED  Final   Report Status 02/18/2016 FINAL  Final  Surgical pcr screen     Status: None   Collection Time: 02/16/16  1:30 PM  Result Value Ref Range Status   MRSA, PCR NEGATIVE NEGATIVE Final   Staphylococcus aureus NEGATIVE NEGATIVE Final    Comment:        The Xpert SA Assay (FDA approved for NASAL specimens in patients over 65 years of age), is one component of a comprehensive surveillance program.  Test performance has been validated by [facility] for patients greater than or equal to 9 year old. It is not intended to diagnose infection nor to guide or monitor treatment.   Aerobic/Anaerobic Culture (surgical/deep wound)     Status: None (Preliminary result)   Collection Time: 02/17/16  3:08 PM  Result Value Ref Range Status   Specimen Description WOUND RIGHT ARM  Final   Special Requests INFECTED AVG GRAFT PT ON ANCEF  Final   Gram Stain   Final    FEW  WBC PRESENT,BOTH PMN AND MONONUCLEAR FEW GRAM POSITIVE COCCI IN PAIRS AND CHAINS    Culture TOO YOUNG TO READ  Final   Report Status PENDING  Incomplete  Culture, blood (routine x 2)     Status: None (Preliminary result)   Collection Time: 02/17/16 10:30 PM  Result Value Ref Range Status   Specimen Description BLOOD LEFT ANTECUBITAL  Final   Special Requests IN  PEDIATRIC BOTTLE 3CC  Final   Culture  Setup Time   Final    GRAM POSITIVE COCCI IN CLUSTERS IN PEDIATRIC BOTTLE CRITICAL RESULT CALLED TO, READ BACK BY AND VERIFIED WITH: J LEDFORD,PHARMD AT 0213 02/19/16 T [place]    Culture   Final    GRAM POSITIVE COCCI CULTURE REINCUBATED FOR BETTER GROWTH    Report Status PENDING  Incomplete  Blood Culture ID Panel (Reflexed)     Status: Abnormal   Collection Time: 02/17/16 10:30 PM  Result Value Ref Range Status   Enterococcus species NOT DETECTED NOT DETECTED Final   Listeria monocytogenes NOT DETECTED NOT DETECTED Final   Staphylococcus species DETECTED (A) NOT DETECTED Final    Comment: CRITICAL RESULT CALLED TO, READ BACK BY AND VERIFIED WITH: TO JLEDFORD(PHARD) BY TCLEVELAND 02/19/2016 AT 2:13AM    Staphylococcus aureus NOT DETECTED NOT DETECTED Final   Methicillin resistance NOT DETECTED NOT DETECTED Final   Streptococcus species NOT DETECTED NOT DETECTED Final   Streptococcus agalactiae NOT DETECTED NOT DETECTED Final   Streptococcus pneumoniae NOT DETECTED NOT DETECTED Final   Streptococcus pyogenes NOT DETECTED NOT DETECTED Final   Acinetobacter baumannii NOT DETECTED NOT DETECTED Final   Enterobacteriaceae species NOT DETECTED NOT DETECTED Final   Enterobacter cloacae complex NOT DETECTED NOT DETECTED Final   Escherichia coli NOT DETECTED NOT DETECTED Final   Klebsiella oxytoca NOT DETECTED NOT DETECTED Final   Klebsiella pneumoniae NOT DETECTED NOT DETECTED Final   Proteus species NOT DETECTED NOT DETECTED Final   Serratia marcescens NOT DETECTED NOT DETECTED Final   Haemophilus influenzae NOT DETECTED NOT DETECTED Final   Neisseria meningitidis NOT DETECTED NOT DETECTED Final   Pseudomonas aeruginosa NOT DETECTED NOT DETECTED Final   Candida albicans NOT DETECTED NOT DETECTED Final   Candida glabrata NOT DETECTED NOT DETECTED Final   Candida krusei NOT DETECTED NOT DETECTED Final   Candida parapsilosis NOT DETECTED NOT  DETECTED Final   Candida tropicalis NOT DETECTED NOT DETECTED Final  Culture, blood (routine x 2)     Status: None (Preliminary result)   Collection Time: 02/17/16 10:38 PM  Result Value Ref Range Status   Specimen Description BLOOD LEFT ANTECUBITAL  Final   Special Requests IN PEDIATRIC BOTTLE 2CC  Final   Culture  Setup Time   Final    GRAM POSITIVE COCCI IN CLUSTERS IN PEDIATRIC BOTTLE CRITICAL RESULT CALLED TO, READ BACK BY AND VERIFIED WITH: G ABBOTT,PHARMD AT 1610 02/19/16 BY L BEN[place]    Culture GRAM POSITIVE COCCI  Final   Report Status PENDING  Incomplete  Culture, blood (Routine X 2) w Reflex to ID Panel     Status: None (Preliminary result)   Collection Time: 02/18/16 11:03 AM  Result Value Ref Range Status   Specimen Description BLOOD LEFT ARM  Final   Special Requests   Final    BOTTLES DRAWN AEROBIC AND ANAEROBIC AEB=6CC, ANA=4CC   Culture NO GROWTH < 24 HOURS  Final   Report Status PENDING  Incomplete  Culture, blood (Routine X 2) w Reflex to ID Panel  Status: None (Preliminary result)   Collection Time: 02/18/16 11:03 AM  Result Value Ref Range Status   Specimen Description BLOOD RIGHT ARM  Final   Special Requests IN PEDIATRIC BOTTLE 1 CC  Final   Culture NO GROWTH < 24 HOURS  Final   Report Status PENDING  Incomplete    Studies/Results: Dg Chest Port 1 View  Result Date: 02/19/2016 CLINICAL DATA:  Dialysis catheter insertion EXAM: PORTABLE CHEST 1 VIEW COMPARISON:  02/15/2016 FINDINGS: Left IJ dialysis catheter tip in the right atrium. Bilateral vascular stents noted. Mild cardiomegaly with vascular congestion and increased interstitial prominence suggesting a component of mild edema. No large effusion or pneumothorax. Trachea is midline. Degenerative changes of the spine and shoulders. IMPRESSION: Left IJ dialysis catheter, tip mid right atrium. Mild developing interstitial edema pattern. No effusion or pneumothorax. Electronically Signed   By: Jerilynn Mages.  Shick M.D.    On: 02/19/2016 09:34   Dg Fluoro Guide Cv Line-no Report  Result Date: 02/19/2016 There is no Radiologist interpretation  for this exam.     Assessment/Plan:  INTERVAL HISTORY: AV graft resected though some of the graft material could not be removed, transthoracic echocardiogram shows Aortic valve is thickened, predominantly the non-coronary cusp  Repeat blood cultures taken post removal of graft on 02/17/16 are unfortunately still growing MS coag negative staph in both sites  The cultures done 02/18/2016 are not yet going an organism and hopefully will remain that way as he she now has a new hemodialysis catheter present placed this morning  Her coag negative staph is unfortunately not staph epidermidis but Staphylococcus LUGNDUNENSIS New neck and low back pain   Active Problems:   Altered mental status   Anemia   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 female withwith  History of 18 years of being on hemodialysis for end-stage renal disease with limited access options now presents with coagulase negative staphylococcal bacteremia due to AV graft infection that apparently has been smoldering for months, now sp removal of graft though with part not   #1 Methicillin Staphylococcal LUGDUNENSIS  due to AV graft infection sp 1. Removal of infected right upper arm graft2. Bovine pericardial patch angioplasty of right brachial artery 3. Removal of old right forearm graft. There was a stent still in place that Dr. Scot Dock could not remove because it was incorporated into the vein  STAPHYLOCOCCUS LUGDUNESIS UNLIKE STAPH EPI IS A HIGHLY VIRULENT ORGANISM WITH TROPISM FOR THE HEART VALVES WHERE IT CAN BE VERY DESTRUCTIVE  I HAVE A VERY HIGH SUSPICION FOR ENDOCARDITIS (We certainly already know that she has endovascular infection but I worry about her heart  valves)  HerTransthoracic echocardiogram has not excluded endocarditis and is infected concerning for endocarditis  SHE NEEDS A TEE  Continue cefazolin with addition of rifampin given persistence of stent  I am WORRIED ALSO  BY her repeat persistently positive blood cultures for sTAPH LUGDUNESIS  on 122217, hopefully the ones from yesterday will be sterile so that we can have some assurance that her blood was sufficiently sterile prior to placement of her hemodialysis catheter otherwise I'll be concerned of the catheter will have become infected  Her neck pain could very well be due to her recent HD catheter placement  However her lower back pain could be a sign of deep infection in L spine  I will order  an MRI of the the L spine without contrast if neck pain persists this site should also be imaged  She will need at least 6 weeks of IV antibiotics along with oral rifampin if she can tolerate it.  After that I would give her protracted oral antibiotics such as keflex and rifampin for protracted period  Dr.  Johnnye Sima will be covering from Christmas day the 25th through the 28th and then Dr. Baxter Flattery the 29th through New Year's Day1st and will be available for questions.     LOS: 4 days   Alcide Evener 02/19/2016, 12:23 PM

## 2016-02-19 NOTE — Anesthesia Preprocedure Evaluation (Addendum)
Anesthesia Evaluation  Patient identified by MRN, date of birth, ID band Patient awake    Reviewed: Allergy & Precautions, H&P , NPO status , Patient's Chart, lab work & pertinent test results  Airway Mallampati: III  TM Distance: >3 FB Neck ROM: Full    Dental no notable dental hx. (+) Teeth Intact, Dental Advisory Given   Pulmonary neg pulmonary ROS,    Pulmonary exam normal breath sounds clear to auscultation       Cardiovascular negative cardio ROS   Rhythm:Regular Rate:Normal     Neuro/Psych  Headaches, negative psych ROS   GI/Hepatic Neg liver ROS, GERD  Medicated and Controlled,  Endo/Other  Morbid obesity  Renal/GU ESRF and DialysisRenal disease  negative genitourinary   Musculoskeletal   Abdominal   Peds  Hematology negative hematology ROS (+) anemia ,   Anesthesia Other Findings   Reproductive/Obstetrics negative OB ROS                            Anesthesia Physical Anesthesia Plan  ASA: III  Anesthesia Plan: MAC   Post-op Pain Management:    Induction: Intravenous  Airway Management Planned: Simple Face Mask  Additional Equipment:   Intra-op Plan:   Post-operative Plan:   Informed Consent: I have reviewed the patients History and Physical, chart, labs and discussed the procedure including the risks, benefits and alternatives for the proposed anesthesia with the patient or authorized representative who has indicated his/her understanding and acceptance.   Dental advisory given  Plan Discussed with: CRNA  Anesthesia Plan Comments:         Anesthesia Quick Evaluation

## 2016-02-19 NOTE — Interval H&P Note (Signed)
History and Physical Interval Note:  02/19/2016 7:10 AM  [name] Marks  has presented today for surgery, with the diagnosis of End Stage Renal Disease N18.6  The various methods of treatment have been discussed with the patient and family. After consideration of risks, benefits and other options for treatment, the patient has consented to  Procedure(s): INSERTION OF DIALYSIS CATHETER (N/A) as a surgical intervention .  The patient's history has been reviewed, patient examined, no change in status, stable for surgery.  I have reviewed the patient's chart and labs.  Questions were answered to the patient's satisfaction.     [name]

## 2016-02-20 ENCOUNTER — Encounter (HOSPITAL_COMMUNITY): Payer: Self-pay | Admitting: Vascular Surgery

## 2016-02-20 ENCOUNTER — Inpatient Hospital Stay (HOSPITAL_COMMUNITY): Payer: Medicare Other

## 2016-02-20 LAB — CBC
HCT: 25.4 % — ABNORMAL LOW (ref 36.0–46.0)
HEMATOCRIT: 25 % — AB (ref 36.0–46.0)
HEMOGLOBIN: 8.1 g/dL — AB (ref 12.0–15.0)
Hemoglobin: 8.2 g/dL — ABNORMAL LOW (ref 12.0–15.0)
MCH: 27.3 pg (ref 26.0–34.0)
MCH: 26.9 pg (ref 26.0–34.0)
MCHC: 32.3 g/dL (ref 30.0–36.0)
MCHC: 32.4 g/dL (ref 30.0–36.0)
MCV: 83.3 fL (ref 78.0–100.0)
MCV: 84.2 fL (ref 78.0–100.0)
PLATELETS: 246 10*3/uL (ref 150–400)
Platelets: 244 10*3/uL (ref 150–400)
RBC: 2.97 MIL/uL — AB (ref 3.87–5.11)
RBC: 3.05 MIL/uL — AB (ref 3.87–5.11)
RDW: 18.6 % — ABNORMAL HIGH (ref 11.5–15.5)
RDW: 18.7 % — ABNORMAL HIGH (ref 11.5–15.5)
WBC: 12.3 10*3/uL — AB (ref 4.0–10.5)
WBC: 11.2 10*3/uL — AB (ref 4.0–10.5)

## 2016-02-20 LAB — CULTURE, BLOOD (ROUTINE X 2)

## 2016-02-20 NOTE — Progress Notes (Signed)
   VASCULAR SURGERY ASSESSMENT & PLAN:  3 Day Post-Op s/p: Removal of infected right arm AVGs  1 Day Post-op: TDC  Can be considered for a lef thigh AVG in the future, once infection has cleared. Has previously had a right thigh AVG which failed.   SUBJECTIVE: No complaints.  PHYSICAL EXAM: Vitals:   02/19/16 1413 02/19/16 1728 02/19/16 2016 02/20/16 0458  BP: (!) 95/42 (!) 88/30 (!) 99/32 (!) 106/31  Pulse: 80 80 82 71  Resp: (!) 24 (!) 22 20 19   Temp: 98.1 F (36.7 C) 97.9 F (36.6 C) 98.2 F (36.8 C) 98.2 F (36.8 C)  TempSrc: Oral Oral Oral Oral  SpO2: 100% 99% 100% 100%  Weight: 212 lb 15.4 oz (96.6 kg)  212 lb 4.8 oz (96.3 kg)   Height:       Right arm incisions look good. Sutures out in 1 more week Palpable DP pulse left foot.   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

## 2016-02-20 NOTE — Progress Notes (Addendum)
[facility] Progress Note   Subjective: Seen in room. No c/o's.   Objective Vitals:   02/19/16 1728 02/19/16 2016 02/20/16 0458 02/20/16 1009  BP: (!) 88/30 (!) 99/32 (!) 106/31 (!) 104/25  Pulse: 80 82 71 81  Resp: (!) 22 20 19 18   Temp: 97.9 F (36.6 C) 98.2 F (36.8 C) 98.2 F (36.8 C) 98.5 F (36.9 C)  TempSrc: Oral Oral Oral Oral  SpO2: 99% 100% 100% 98%  Weight:  96.3 kg (212 lb 4.8 oz)    Height:       Physical Exam General: Well appearing female, NAD. Heart: RRR; 2/6 systolic murmur Lungs: CTA anteriorly. Abdomen:soft, non-tender Extremities: No LE edema. Dialysis Access: none currently.Bandaged R arm s/p AVG resection. L IJ new TDC   Dialysis Orders: MWF at [facility]  4h   99.5kg   2/2 bath  RUE AVG (now removed)/ new L IJ cath  Hep 6000 w 2500 midrun - Mircera 211mcg IV q 2 weeks (last given 12/13) - Calcitriol 0.22mcg PO q HD  Assessment/Plan: 1. AMS: on admit, CT negative. Resolved.  2. 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  Has new TDC.  3. ESRD: last HD 12/24, next HD on Wed. New L IJ TDC (placed 12/24 )  4. HTN/volume: On midodrine, 2-3 kg under dry 5. Anemia: Has had recent vaginal and AVG bleeding. Hgb 8.6, s/p 2U PRBCs 12/20, 1U 12/21, 1U 12/22. Not due to ESA yet. Tsat low, but holding off on IV iron in setting of bacteremia. Monitor. 6. Secondary hyperparathyroidism: Ca ok. Continue binders/VDRA/sensipar.  7. Nutrition: Known complex endometrial hyperplasia. Alb 2.6. Holding megace  [name] MD [employer] pgr [phone]   02/20/2016, 2:13 PM    Medications:  . aspirin EC  81 mg Oral Daily  .  ceFAZolin (ANCEF) IV  1 g Intravenous Q24H  . [START ON 02/22/2016]  ceFAZolin (ANCEF) IV  2 g Intravenous Q M,W,F-1800  . cinacalcet  60 mg Oral QPM  . heparin  5,000 Units Subcutaneous Q8H  . ipratropium  2 spray Each Nare QID  . methocarbamol  500 mg Oral  BID  . midodrine  10 mg Oral BID WC  . multivitamin  1 tablet Oral QHS  . pantoprazole  40 mg Intravenous Q12H  . rifampin  300 mg Oral Q12H  . sevelamer carbonate  1,600 mg Oral TID WC  . sodium chloride  500 mL Intravenous Once     Additional Objective Labs: Basic Metabolic Panel:  Recent Labs Lab 02/16/16 0212 02/17/16 0217 02/19/16 0445  NA 137 136 134*  K 3.2* 3.7 3.2*  CL 98* 98* 96*  CO2 28 23 27   GLUCOSE 96 104* 101*  BUN 16 24* 24*  CREATININE 5.63* 7.33* 5.97*  CALCIUM 8.9 9.5 9.2   Liver Function Tests:  Recent Labs Lab 02/15/16 0822  AST 23  ALT 15  ALKPHOS 116  BILITOT 1.2  PROT 6.8  ALBUMIN 2.6*   CBC:  Recent Labs Lab 02/17/16 2234 02/18/16 0621 02/19/16 0445 02/20/16 0003 02/20/16 0529  WBC 15.9* 14.9* 13.1* 11.2* 12.3*  HGB 9.2* 8.6* 8.2* 8.2* 8.1*  HCT 27.1* 25.9* 25.1* 25.4* 25.0*  MCV 80.4 81.4 82.8 83.3 84.2  PLT 219 211 250 246 244   Blood Culture    Component Value Date/Time   SDES BLOOD LEFT ARM 02/18/2016 1103   SDES BLOOD RIGHT ARM  02/18/2016 1103   SPECREQUEST  02/18/2016 1103    BOTTLES DRAWN AEROBIC AND ANAEROBIC AEB=6CC, ANA=4CC   SPECREQUEST IN PEDIATRIC BOTTLE 1 CC 02/18/2016 1103   CULT NO GROWTH < 24 HOURS 02/18/2016 1103   CULT NO GROWTH < 24 HOURS 02/18/2016 1103   REPTSTATUS PENDING 02/18/2016 1103   REPTSTATUS PENDING 02/18/2016 1103   CBG:  Recent Labs Lab 02/15/16 0923  GLUCAP 94   Iron Studies: No results for input(s): IRON, TIBC, TRANSFERRIN, FERRITIN in the last 72 hours. Medications:

## 2016-02-20 NOTE — Progress Notes (Signed)
Family Medicine Teaching Service Daily Progress Note Intern Pager: [phone]  Patient name: [name] Marks Medical record number: [id number] Date of birth: 04-Apr-1955 Age: 60 y.o. Gender: female  Primary Care Provider: Lockie Pares, MD Consultants: Nephrology, ID  Code Status: Full   Pt Overview and Major Events to Date:  12/22: removal of infected R arm graft with bovine pericardial patch angioplasty of R brachial artery, and removal of R forearm graft.   Assessment and Plan: [name] Marks is a 60 y.o. female presenting with AMS . PMH is significant for ESRD on HD, complex atypical endometrial hyperplasia   Bacteremia - Patient with 2/2 blood cultures positive for Coagulase negative staphylococcus from AV graft infection. She is s/p removal on 12/22.  WBC 16.2 >>12.3. Intermittent hypotension to 90/30s overnight. Afebrile. TTE with thickened aortic valve, can't rule out vegetation. 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  12/22 (after graft removal) Bcx also with STAPHYLOCOCCUS LUGDUNENSIS  - Ancef per pharmacy, rixamin per ID  - Per Infectious disease,  6 week course of IV ancef and rixamin - 12/24 BCx pending - TEE ordered but not performed yet. - wound care to follow up  - Incentive spirometry to improve air movement post-operatively.  Deconditioning- likely from acute illness/ decreased mobility during hospitalization - PT/ OT reordered, as I don't see any recent notes.  Will get them back on board. - Continue to mobilize patient as tolerated.  ESRD on MWF schedule. graft removed 12/22. Tunneled HD catheter placed (12/24) - follow renal function with dialysis  - nephrology following - RFP ordered this am.  Anemia - Stable, no more bleeding from site.  Hgb 8.2>8.1. Baseline Hgb 11. Has had 4units pRBCs total this admission, last 12/22.  Hgb drop likely due to fistula bleeding.  - monitor CBC  AMS, resolved. Likely 2/2 to bacteremia from AV graft infection, resolved. On  admit head CT/MRI without acute findings   Complex Atypical Endometrial Hyperplasia - continue Megace  FEN/ GI: Renal diet, SLIV DVT prx: heparin sub-q   Disposition: SNF per PT recs   Subjective:  Patient reports that she is feeling ok this morning.  She reports some fatigue with going to restroom.  She notes she is getting tired doing normal stuff.  She reports at baseline she uses a walker to get around but usually doesn't get tired with ambulation/ dressing.  Objective: Temp:  [97.9 F (36.6 C)-98.5 F (36.9 C)] 98.5 F (36.9 C) (12/25 1009) Pulse Rate:  [71-82] 81 (12/25 1009) Resp:  [18-24] 18 (12/25 1009) BP: (88-121)/(25-51) 104/25 (12/25 1009) SpO2:  [98 %-100 %] 98 % (12/25 1009) Weight:  [212 lb 4.8 oz (96.3 kg)-212 lb 15.4 oz (96.6 kg)] 212 lb 4.8 oz (96.3 kg) (12/24 2016) Physical Exam: General: ambulating from restroom in NAD, appears tired Cardiovascular: RRR, no murmurs. No pitting edema. No JVD.  Respiratory: CTAB, normal WOB. Abdomen: BS+, soft, non-distended, non-tender.  Extremities: Right upper extremity completely wrapped in ACE bandage, not taken down. No surrounding erythema.  Bandage appears dry.  Ambulates slowly with aid of walker, though has some trouble getting up from seated position. Neuro: AOx3, speech normal, follows commands, no focal deficits Psych: mood stable  Laboratory:  Recent Labs Lab 02/19/16 0445 02/20/16 0003 02/20/16 0529  WBC 13.1* 11.2* 12.3*  HGB 8.2* 8.2* 8.1*  HCT 25.1* 25.4* 25.0*  PLT 250 246 244    Recent Labs Lab 02/15/16 0822  02/16/16 0212 02/17/16 0217 02/19/16 0445  NA 137  --  137 136 134*  K 2.8*  --  3.2* 3.7 3.2*  CL 92*  --  98* 98* 96*  CO2 28  --  28 23 27   BUN 18  --  16 24* 24*  CREATININE 7.08*  < > 5.63* 7.33* 5.97*  CALCIUM 10.0  --  8.9 9.5 9.2  PROT 6.8  --   --   --   --   BILITOT 1.2  --   --   --   --   ALKPHOS 116  --   --   --   --   ALT 15  --   --   --   --   AST 23  --   --    --   --   GLUCOSE 102*  --  96 104* 101*  < > = values in this interval not displayed.  Imaging/Diagnostic Tests: Mr Lumbar Spine Wo Contrast  Result Date: 02/20/2016 CLINICAL DATA:  Bacteremia. Concern for osteomyelitis or epidural abscess. EXAM: MRI LUMBAR SPINE WITHOUT CONTRAST TECHNIQUE: Multiplanar, multisequence MR imaging of the lumbar spine was performed. No intravenous contrast was administered. COMPARISON:  None. FINDINGS: Despite efforts by the technologist and patient, motion artifact is present on today's examination and could not be eliminated. This reduces the sensitivity and specificity of the study. Segmentation:  Normal Alignment:  Normal Vertebrae: There is generally decreased T1 and T2 weighted signal throughout the vertebral bodies. There is multilevel degenerative endplate signal change. There are Schmorl's nodes at the inferior L2 endplate and superior L4 endplate. There is no evidence of discitis osteomyelitis. No acute compression fracture. Conus medullaris: Extends to the L1 level and appears normal. No epidural abscess, hematoma or fluid collection. Paraspinal and other soft tissues: The visualized aorta, IVC and iliac vessels are normal. The visualized retroperitoneal organs and paraspinal soft tissues are normal. Disc levels: T10-T12: These levels are evaluated on sagittal sequences only. There is no spinal canal or neural foraminal stenosis. T12-L1:: Normal disc space and facets. No spinal canal or neuroforaminal stenosis. L1-L2: Mild disc desiccation with minimal bulge. No spinal canal or neural foraminal stenosis. Normal facets. L2-L3: Disc space narrowing and mild facet hypertrophy with small disc bulge. No spinal canal stenosis. No neural foraminal stenosis. L3-L4: Disc desiccation with minimal bulge. Mild bilateral facet hypertrophy. No spinal canal stenosis. No neural foraminal stenosis. L4-L5: Disc desiccation with medium-sized bulge. Bilateral severe facet hypertrophy  with widening of both joint spaces and fluid within both facet joints. No spinal canal or neural foraminal stenosis. L5-S1: Disc desiccation and mild facet hypertrophy. No spinal canal or neural foraminal stenosis. IMPRESSION: 1. No evidence of discitis osteomyelitis or epidural abscess. 2. 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 Electronically Signed   By: Ulyses Jarred M.D.   On: 02/20/2016 03:44   Dg Chest Port 1 View  Result Date: 02/19/2016 CLINICAL DATA:  Dialysis catheter insertion EXAM: PORTABLE CHEST 1 VIEW COMPARISON:  02/15/2016 FINDINGS: Left IJ dialysis catheter tip in the right atrium. Bilateral vascular stents noted. Mild cardiomegaly with vascular congestion and increased interstitial prominence suggesting a component of mild edema. No large effusion or pneumothorax. Trachea is midline. Degenerative changes of the spine and shoulders. IMPRESSION: Left IJ dialysis catheter, tip mid right atrium. Mild developing interstitial edema pattern. No effusion or pneumothorax. Electronically Signed   By: Jerilynn Mages.  Shick M.D.   On: 02/19/2016 09:34   Dg Fluoro  Guide Cv Line-no Report  Result Date: 02/19/2016 There is no Radiologist interpretation  for this exam.   Janora Norlander, DO 02/20/2016, 10:48 AM PGY-3, [place] Intern pager: [phone], text pages welcome

## 2016-02-21 ENCOUNTER — Encounter (HOSPITAL_COMMUNITY): Payer: Self-pay | Admitting: Vascular Surgery

## 2016-02-21 DIAGNOSIS — Y712 Prosthetic and other implants, materials and accessory cardiovascular devices associated with adverse incidents: Secondary | ICD-10-CM

## 2016-02-21 DIAGNOSIS — B957 Other staphylococcus as the cause of diseases classified elsewhere: Secondary | ICD-10-CM

## 2016-02-21 DIAGNOSIS — I358 Other nonrheumatic aortic valve disorders: Secondary | ICD-10-CM

## 2016-02-21 LAB — CBC
HCT: 26.9 % — ABNORMAL LOW (ref 36.0–46.0)
HEMOGLOBIN: 8.6 g/dL — AB (ref 12.0–15.0)
MCH: 26.6 pg (ref 26.0–34.0)
MCHC: 32 g/dL (ref 30.0–36.0)
MCV: 83.3 fL (ref 78.0–100.0)
PLATELETS: 310 10*3/uL (ref 150–400)
RBC: 3.23 MIL/uL — ABNORMAL LOW (ref 3.87–5.11)
RDW: 18.6 % — ABNORMAL HIGH (ref 11.5–15.5)
WBC: 13.1 10*3/uL — ABNORMAL HIGH (ref 4.0–10.5)

## 2016-02-21 LAB — RENAL FUNCTION PANEL
ALBUMIN: 2.2 g/dL — AB (ref 3.5–5.0)
ANION GAP: 10 (ref 5–15)
BUN: 18 mg/dL (ref 6–20)
CO2: 25 mmol/L (ref 22–32)
CREATININE: 5.87 mg/dL — AB (ref 0.44–1.00)
Calcium: 9.6 mg/dL (ref 8.9–10.3)
Chloride: 97 mmol/L — ABNORMAL LOW (ref 101–111)
GFR calc non Af Amer: 7 mL/min — ABNORMAL LOW (ref >60)
GFR, EST AFRICAN AMERICAN: 8 mL/min — AB (ref >60)
GLUCOSE: 91 mg/dL (ref 65–99)
PHOSPHORUS: 3.5 mg/dL (ref 2.5–4.6)
Potassium: 4.1 mmol/L (ref 3.5–5.1)
SODIUM: 132 mmol/L — AB (ref 135–145)

## 2016-02-21 MED ORDER — MIDODRINE HCL 5 MG PO TABS
10.0000 mg | ORAL_TABLET | Freq: Three times a day (TID) | ORAL | Status: DC
  Administered 2016-02-21 – 2016-02-24 (×9): 10 mg via ORAL
  Filled 2016-02-21 (×8): qty 2

## 2016-02-21 MED ORDER — PANTOPRAZOLE SODIUM 40 MG PO TBEC
40.0000 mg | DELAYED_RELEASE_TABLET | Freq: Two times a day (BID) | ORAL | Status: DC
  Administered 2016-02-21 – 2016-02-24 (×7): 40 mg via ORAL
  Filled 2016-02-21 (×7): qty 1

## 2016-02-21 NOTE — Care Management Important Message (Signed)
Important Message  Patient Details  Name: [name] Marks MRN: [id number] Date of Birth: 1955/09/17   Medicare Important Message Given:  Yes    Sie Formisano [address] 02/21/2016, 2:17 PM

## 2016-02-21 NOTE — Progress Notes (Signed)
    [facility] has been requested to perform a transesophageal echocardiogram on [name] Marks for bacteremia.  After careful review of history and examination, the risks and benefits of transesophageal echocardiogram have been explained including risks of esophageal damage, perforation (1:10,000 risk), bleeding, pharyngeal hematoma as well as other potential complications associated with conscious sedation including aspiration, arrhythmia, respiratory failure and death. Alternatives to treatment were discussed, questions were answered. Patient is willing to proceed.  TEE - Dr. Debara Pickett, 02/22/16  @ 1400 . NPO after midnight. Meds with sips.   Janziel Hockett, PA-C 02/21/2016 1:20 PM

## 2016-02-21 NOTE — Progress Notes (Signed)
Pharmacy Antibiotic Note  [name] Marks is a 60 y.o. female admitted on 02/15/2016 with bacteremia and AV fistula graft infection.   Continues on IV Cefazolin with six weeks planned, repeat blood cultures negative to date   Plan: Cefazolin back on HD schedule --> 2 grams MWF starting 12/27 Continue to follow  Height: 5\' 1"  (154.9 cm) Weight: 212 lb 4.8 oz (96.3 kg) IBW/kg (Calculated) : 47.8  Temp (24hrs), Avg:98.3 F (36.8 C), Min:98.1 F (36.7 C), Max:98.5 F (36.9 C)   Recent Labs Lab 02/15/16 1107 02/15/16 1808 02/15/16 1812 02/16/16 0212  02/17/16 0217  02/18/16 0621 02/19/16 0445 02/20/16 0003 02/20/16 0529 02/21/16 0744  WBC  --   --  13.3* 15.8*  < > 16.2*  < > 14.9* 13.1* 11.2* 12.3* 13.1*  CREATININE  --   --  7.90* 5.63*  --  7.33*  --   --  5.97*  --   --  5.87*  LATICACIDVEN 2.06* 1.3  --   --   --   --   --   --   --   --   --   --   < > = values in this interval not displayed.  Estimated Creatinine Clearance: 10.7 mL/min (by C-G formula based on SCr of 5.87 mg/dL (H)).    Allergies  Allergen Reactions  . Contrast Media [Iodinated Diagnostic Agents] Anaphylaxis  . Ancef [Cefazolin Sodium] Nausea And Vomiting  . Naproxen Sodium Itching     Thank you for allowing pharmacy to be a part of this patient's care. Anette Guarneri, PharmD [phone]  02/21/2016 9:19 AM

## 2016-02-21 NOTE — Progress Notes (Signed)
Physical Therapy Treatment Patient Details Name: Evie A [name] MRN: [id number] DOB: [birth year] Today's Date: 02/21/2016    History of Present Illness Janayah A [name] is a 60 y.o. female presenting with AMS . PMH is significant for ESRD on HD, complex atypical endometrial hyperplasia    PT Comments    Pt with improved cognition and demo's safe use of RW. Pt however con't to remain deconditioned with impaired balance/increased falls risk and requiring minA for transfers. Pt to con't to benefit from ST-SNF upon d/c to achieve safe mod I level of function for safe transition home alone.  Follow Up Recommendations  SNF     Equipment Recommendations  Rolling walker with 5" wheels    Recommendations for Other Services       Precautions / Restrictions Precautions Precautions: Fall Precaution Comments:  R UE dressing Restrictions Weight Bearing Restrictions: No    Mobility  Bed Mobility               General bed mobility comments: pt up in chair  Transfers Overall transfer level: Needs assistance Equipment used: Rolling walker (2 wheeled) Transfers: Sit to/from Stand Sit to Stand: Min assist         General transfer comment: minA for initial power up from lower chair height, v/c's to push up from arm rest  Ambulation/Gait Ambulation/Gait assistance: Min guard Ambulation Distance (Feet): 60 Feet Assistive device: Rolling walker (2 wheeled) Gait Pattern/deviations: Step-through pattern;Wide base of support Gait velocity: slow Gait velocity interpretation: Below normal speed for age/gender General Gait Details: + DOE, decreased pace, decreased step height and length, pt with 2 standing rest breaks due to "my legs are going to give out, i'm sweating'   Stairs            Wheelchair Mobility    Modified Rankin (Stroke Patients Only)       Balance Overall balance assessment: Needs assistance         Standing balance support: Bilateral upper  extremity supported Standing balance-Leahy Scale: Fair Standing balance comment: needs UE support to steady self                    Cognition Arousal/Alertness: Awake/alert Behavior During Therapy: WFL for tasks assessed/performed Overall Cognitive Status: Within Functional Limits for tasks assessed                      Exercises      General Comments        Pertinent Vitals/Pain Pain Assessment: 0-10 Pain Score: 4  Pain Location: back  Pain Descriptors / Indicators: Aching Pain Intervention(s): Premedicated before session    Home Living                      Prior Function            PT Goals (current goals can now be found in the care plan section) Acute Rehab PT Goals Patient Stated Goal: go home Progress towards PT goals: Progressing toward goals    Frequency    Min 3X/week      PT Plan Current plan remains appropriate    Co-evaluation             End of Session Equipment Utilized During Treatment: Gait belt Activity Tolerance: Patient tolerated treatment well Patient left: in chair;with call bell/phone within reach;with family/visitor present     Time: 0102-7253 PT Time Calculation (min) (ACUTE ONLY): 16 min  Charges:  $  Gait Training: 8-22 mins                    G Codes:      Berline Lopes 03-20-16, 3:45 PM   Kittie Plater, PT, DPT Pager #: [phone] Office #: [phone]

## 2016-02-21 NOTE — Progress Notes (Signed)
INFECTIOUS DISEASE PROGRESS NOTE  ID: [name] Marks is a 60 y.o. 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   Abtx:  Anti-infectives    Start     Dose/Rate Route Frequency Ordered Stop   02/22/16 1800  ceFAZolin (ANCEF) IVPB 2g/100 mL premix     2 g 200 mL/hr over 30 Minutes Intravenous Every M-W-F (1800) 02/18/16 1236     02/19/16 1800  ceFAZolin (ANCEF) IVPB 1 g/50 mL premix     1 g 100 mL/hr over 30 Minutes Intravenous Every 24 hours 02/18/16 1236 02/22/16 1759   02/19/16 0700  vancomycin (VANCOCIN) IVPB 1000 mg/200 mL premix  Status:  Discontinued     1,000 mg 200 mL/hr over 60 Minutes Intravenous On call to O.R. 02/18/16 1017 02/18/16 1239   02/18/16 1400  ceFAZolin (ANCEF) IVPB 1 g/50 mL premix     1 g 100 mL/hr over 30 Minutes Intravenous  Once 02/18/16 1236 02/18/16 1631   02/18/16 1000  rifampin (RIFADIN) capsule 300 mg     300 mg Oral Every 12 hours 02/18/16 0938     02/17/16 1800  ceFAZolin (ANCEF) IVPB 2g/100 mL premix  Status:  Discontinued     2 g 200 mL/hr over 30 Minutes Intravenous Every M-W-F (1800) 02/16/16 1342 02/18/16 1236   02/17/16 1315  vancomycin (VANCOCIN) 1-5 GM/200ML-% IVPB    Comments:  Elliott, [name]   : cabinet override      02/17/16 1315 02/18/16 0129   02/17/16 1200  vancomycin (VANCOCIN) IVPB 1000 mg/200 mL premix  Status:  Discontinued     1,000 mg 200 mL/hr over 60 Minutes Intravenous Every M-W-F (Hemodialysis) 02/16/16 0731 02/16/16 1317   02/17/16 1200  vancomycin (VANCOCIN) IVPB 1000 mg/200 mL premix  Status:  Discontinued     1,000 mg 200 mL/hr  over 60 Minutes Intravenous On call to O.R. 02/16/16 5277 02/16/16 0857   02/17/16 1200  ceFAZolin (ANCEF) IVPB 2g/100 mL premix  Status:  Discontinued     2 g 200 mL/hr over 30 Minutes Intravenous Every M-W-F (Hemodialysis) 02/16/16 1324 02/16/16 1342   02/16/16 0800  ceFAZolin (ANCEF) IVPB 1 g/50 mL premix  Status:  Discontinued     1 g 100 mL/hr over 30 Minutes Intravenous Every 24 hours 02/16/16 0653 02/16/16 0724   02/16/16 0745  vancomycin (VANCOCIN) 2,000 mg in sodium chloride 0.9 % 500 mL IVPB     2,000 mg 250 mL/hr over 120 Minutes Intravenous  Once 02/16/16 0731 02/16/16 1052      Medications:  Scheduled: . aspirin EC  81 mg Oral Daily  .  ceFAZolin (ANCEF) IV  1 g Intravenous Q24H  . [START ON 02/22/2016]  ceFAZolin (ANCEF) IV  2 g Intravenous Q M,W,F-1800  . cinacalcet  60 mg Oral QPM  . heparin  5,000 Units Subcutaneous Q8H  . ipratropium  2 spray Each Nare QID  . midodrine  10 mg Oral TID WC  . multivitamin  1 tablet Oral QHS  . pantoprazole  40 mg Oral BID  . rifampin  300 mg  Oral Q12H  . sevelamer carbonate  1,600 mg Oral TID WC  . sodium chloride  500 mL Intravenous Once    Objective: Vital signs in last 24 hours: Temp:  [98.1 F (36.7 C)-98.4 F (36.9 C)] 98.2 F (36.8 C) (12/26 0900) Pulse Rate:  [72-98] 79 (12/26 0900) Resp:  [18-19] 18 (12/26 0900) BP: (96-122)/(20-34) 110/33 (12/26 0900) SpO2:  [98 %-99 %] 98 % (12/26 0900)   General appearance: alert and no distress Resp: clear to auscultation bilaterally Cardio: regular rate and rhythm GI: normal findings: bowel sounds normal and soft, non-tender  R arm dressed, mild tenderness.  L chest HD line.   Lab Results  Recent Labs  02/19/16 0445  02/20/16 0529 02/21/16 0744  WBC 13.1*  < > 12.3* 13.1*  HGB 8.2*  < > 8.1* 8.6*  HCT 25.1*  < > 25.0* 26.9*  NA 134*  --   --  132*  K 3.2*  --   --  4.1  CL 96*  --   --  97*  CO2 27  --   --  25  BUN 24*  --   --  18  CREATININE 5.97*  --   --   5.87*  < > = values in this interval not displayed. Liver Panel  Recent Labs  02/21/16 0744  ALBUMIN 2.2*   Sedimentation Rate No results for input(s): ESRSEDRATE in the last 72 hours. C-Reactive Protein No results for input(s): CRP in the last 72 hours.  Microbiology: Recent Results (from the past 240 hour(s))  Culture, blood (Routine X 2) w Reflex to ID Panel     Status: Abnormal   Collection Time: 02/15/16 10:20 AM  Result Value Ref Range Status   Specimen Description BLOOD LEFT HAND  Final   Special Requests IN PEDIATRIC BOTTLE  2CC  Final   Culture  Setup Time   Final    GRAM POSITIVE COCCI IN CLUSTERS IN PEDIATRIC BOTTLE CRITICAL VALUE NOTED.  VALUE IS CONSISTENT WITH PREVIOUSLY REPORTED AND CALLED VALUE.    Culture STAPHYLOCOCCUS LUGDUNENSIS (A)  Final   Report Status 02/19/2016 FINAL  Final   Organism ID, Bacteria STAPHYLOCOCCUS LUGDUNENSIS  Final      Susceptibility   Staphylococcus lugdunensis - MIC*    CIPROFLOXACIN 1 SENSITIVE Sensitive     ERYTHROMYCIN <=0.25 SENSITIVE Sensitive     GENTAMICIN <=0.5 SENSITIVE Sensitive     OXACILLIN 0.5 SENSITIVE Sensitive     TETRACYCLINE <=1 SENSITIVE Sensitive     VANCOMYCIN <=0.5 SENSITIVE Sensitive     TRIMETH/SULFA <=10 SENSITIVE Sensitive     CLINDAMYCIN <=0.25 SENSITIVE Sensitive     RIFAMPIN <=0.5 SENSITIVE Sensitive     Inducible Clindamycin NEGATIVE Sensitive     * STAPHYLOCOCCUS LUGDUNENSIS  Culture, blood (Routine X 2) w Reflex to ID Panel     Status: Abnormal   Collection Time: 02/15/16 10:27 AM  Result Value Ref Range Status   Specimen Description BLOOD LEFT HAND  Final   Special Requests IN PEDIATRIC BOTTLE  2CC  Final   Culture  Setup Time   Final    GRAM POSITIVE COCCI IN CLUSTERS IN PEDIATRIC BOTTLE CRITICAL RESULT CALLED TO, READ BACK BY AND VERIFIED WITH: VERONDA BRYK,PHARMD @0644  02/16/16 MKELLY,MLT    Culture (A)  Final    STAPHYLOCOCCUS LUGDUNENSIS SUSCEPTIBILITIES PERFORMED ON PREVIOUS  CULTURE WITHIN THE LAST 5 DAYS.    Report Status 02/19/2016 FINAL  Final  Blood Culture ID Panel (Reflexed)  Status: Abnormal   Collection Time: 02/15/16 10:27 AM  Result Value Ref Range Status   Enterococcus species NOT DETECTED NOT DETECTED Final   Listeria monocytogenes NOT DETECTED NOT DETECTED Final   Staphylococcus species DETECTED (A) NOT DETECTED Final    Comment: CRITICAL RESULT CALLED TO, READ BACK BY AND VERIFIED WITH: V BRYK, PHARMD @0644  02/16/16 MKELLY,MLT    Staphylococcus aureus NOT DETECTED NOT DETECTED Final   Methicillin resistance NOT DETECTED NOT DETECTED Final   Streptococcus species NOT DETECTED NOT DETECTED Final   Streptococcus agalactiae NOT DETECTED NOT DETECTED Final   Streptococcus pneumoniae NOT DETECTED NOT DETECTED Final   Streptococcus pyogenes NOT DETECTED NOT DETECTED Final   Acinetobacter baumannii NOT DETECTED NOT DETECTED Final   Enterobacteriaceae species NOT DETECTED NOT DETECTED Final   Enterobacter cloacae complex NOT DETECTED NOT DETECTED Final   Escherichia coli NOT DETECTED NOT DETECTED Final   Klebsiella oxytoca NOT DETECTED NOT DETECTED Final   Klebsiella pneumoniae NOT DETECTED NOT DETECTED Final   Proteus species NOT DETECTED NOT DETECTED Final   Serratia marcescens NOT DETECTED NOT DETECTED Final   Haemophilus influenzae NOT DETECTED NOT DETECTED Final   Neisseria meningitidis NOT DETECTED NOT DETECTED Final   Pseudomonas aeruginosa NOT DETECTED NOT DETECTED Final   Candida albicans NOT DETECTED NOT DETECTED Final   Candida glabrata NOT DETECTED NOT DETECTED Final   Candida krusei NOT DETECTED NOT DETECTED Final   Candida parapsilosis NOT DETECTED NOT DETECTED Final   Candida tropicalis NOT DETECTED NOT DETECTED Final  MRSA culture     Status: None   Collection Time: 02/16/16  5:37 AM  Result Value Ref Range Status   Specimen Description NASOPHARYNGEAL  Final   Special Requests NONE  Final   Culture NO MRSA DETECTED  Final    Report Status 02/18/2016 FINAL  Final  Surgical pcr screen     Status: None   Collection Time: 02/16/16  1:30 PM  Result Value Ref Range Status   MRSA, PCR NEGATIVE NEGATIVE Final   Staphylococcus aureus NEGATIVE NEGATIVE Final    Comment:        The Xpert SA Assay (FDA approved for NASAL specimens in patients over 26 years of age), is one component of a comprehensive surveillance program.  Test performance has been validated by [facility] for patients greater than or equal to 35 year old. It is not intended to diagnose infection nor to guide or monitor treatment.   Aerobic/Anaerobic Culture (surgical/deep wound)     Status: None (Preliminary result)   Collection Time: 02/17/16  3:08 PM  Result Value Ref Range Status   Specimen Description WOUND RIGHT ARM  Final   Special Requests INFECTED AVG GRAFT PT ON ANCEF  Final   Gram Stain   Final    FEW WBC PRESENT,BOTH PMN AND MONONUCLEAR FEW GRAM POSITIVE COCCI IN PAIRS AND CHAINS    Culture   Final    MODERATE STAPHYLOCOCCUS LUGDUNENSIS NO ANAEROBES ISOLATED; CULTURE IN PROGRESS FOR 5 DAYS    Report Status PENDING  Incomplete   Organism ID, Bacteria STAPHYLOCOCCUS LUGDUNENSIS  Final      Susceptibility   Staphylococcus lugdunensis - MIC*    CIPROFLOXACIN 1 INTERMEDIATE Intermediate     ERYTHROMYCIN <=0.25 SENSITIVE Sensitive     GENTAMICIN <=0.5 SENSITIVE Sensitive     OXACILLIN 0.5 SENSITIVE Sensitive     TETRACYCLINE <=1 SENSITIVE Sensitive     VANCOMYCIN <=0.5 SENSITIVE Sensitive     TRIMETH/SULFA <=10 SENSITIVE Sensitive  CLINDAMYCIN <=0.25 SENSITIVE Sensitive     RIFAMPIN <=0.5 SENSITIVE Sensitive     Inducible Clindamycin NEGATIVE Sensitive     * MODERATE STAPHYLOCOCCUS LUGDUNENSIS  Culture, blood (routine x 2)     Status: Abnormal   Collection Time: 02/17/16 10:30 PM  Result Value Ref Range Status   Specimen Description BLOOD LEFT ANTECUBITAL  Final   Special Requests IN PEDIATRIC BOTTLE 3CC  Final   Culture   Setup Time   Final    GRAM POSITIVE COCCI IN CLUSTERS IN PEDIATRIC BOTTLE CRITICAL RESULT CALLED TO, READ BACK BY AND VERIFIED WITH: J LEDFORD,PHARMD AT 0213 02/19/16 T [place]    Culture (A)  Final    STAPHYLOCOCCUS LUGDUNENSIS SUSCEPTIBILITIES PERFORMED ON PREVIOUS CULTURE WITHIN THE LAST 5 DAYS.    Report Status 02/20/2016 FINAL  Final  Blood Culture ID Panel (Reflexed)     Status: Abnormal   Collection Time: 02/17/16 10:30 PM  Result Value Ref Range Status   Enterococcus species NOT DETECTED NOT DETECTED Final   Listeria monocytogenes NOT DETECTED NOT DETECTED Final   Staphylococcus species DETECTED (A) NOT DETECTED Final    Comment: CRITICAL RESULT CALLED TO, READ BACK BY AND VERIFIED WITH: TO JLEDFORD(PHARD) BY TCLEVELAND 02/19/2016 AT 2:13AM    Staphylococcus aureus NOT DETECTED NOT DETECTED Final   Methicillin resistance NOT DETECTED NOT DETECTED Final   Streptococcus species NOT DETECTED NOT DETECTED Final   Streptococcus agalactiae NOT DETECTED NOT DETECTED Final   Streptococcus pneumoniae NOT DETECTED NOT DETECTED Final   Streptococcus pyogenes NOT DETECTED NOT DETECTED Final   Acinetobacter baumannii NOT DETECTED NOT DETECTED Final   Enterobacteriaceae species NOT DETECTED NOT DETECTED Final   Enterobacter cloacae complex NOT DETECTED NOT DETECTED Final   Escherichia coli NOT DETECTED NOT DETECTED Final   Klebsiella oxytoca NOT DETECTED NOT DETECTED Final   Klebsiella pneumoniae NOT DETECTED NOT DETECTED Final   Proteus species NOT DETECTED NOT DETECTED Final   Serratia marcescens NOT DETECTED NOT DETECTED Final   Haemophilus influenzae NOT DETECTED NOT DETECTED Final   Neisseria meningitidis NOT DETECTED NOT DETECTED Final   Pseudomonas aeruginosa NOT DETECTED NOT DETECTED Final   Candida albicans NOT DETECTED NOT DETECTED Final   Candida glabrata NOT DETECTED NOT DETECTED Final   Candida krusei NOT DETECTED NOT DETECTED Final   Candida parapsilosis NOT DETECTED  NOT DETECTED Final   Candida tropicalis NOT DETECTED NOT DETECTED Final  Culture, blood (routine x 2)     Status: Abnormal   Collection Time: 02/17/16 10:38 PM  Result Value Ref Range Status   Specimen Description BLOOD LEFT ANTECUBITAL  Final   Special Requests IN PEDIATRIC BOTTLE 2CC  Final   Culture  Setup Time   Final    GRAM POSITIVE COCCI IN CLUSTERS IN PEDIATRIC BOTTLE CRITICAL RESULT CALLED TO, READ BACK BY AND VERIFIED WITH: G ABBOTT,PHARMD AT 0659 02/19/16 BY L [place]EN[place]    Culture (A)  Final    STAPHYLOCOCCUS LUGDUNENSIS SUSCEPTIBILITIES PERFORMED ON PREVIOUS CULTURE WITHIN THE LAST 5 DAYS.    Report Status 02/20/2016 FINAL  Final  Culture, blood (Routine X 2) w Reflex to ID Panel     Status: None (Preliminary result)   Collection Time: 02/18/16 11:03 AM  Result Value Ref Range Status   Specimen Description BLOOD LEFT ARM  Final   Special Requests   Final    BOTTLES DRAWN AEROBIC AND ANAEROBIC AEB=6CC, ANA=4CC   Culture NO GROWTH 3 DAYS  Final   Report Status PENDING  Incomplete  Culture, blood (Routine X 2) w Reflex to ID Panel     Status: None (Preliminary result)   Collection Time: 02/18/16 11:03 AM  Result Value Ref Range Status   Specimen Description BLOOD RIGHT ARM  Final   Special Requests IN PEDIATRIC BOTTLE 1 CC  Final   Culture NO GROWTH 3 DAYS  Final   Report Status PENDING  Incomplete  Culture, blood (Routine X 2) w Reflex to ID Panel     Status: None (Preliminary result)   Collection Time: 02/20/16 12:03 AM  Result Value Ref Range Status   Specimen Description BLOOD LEFT HAND  Final   Special Requests IN PEDIATRIC BOTTLE 3CC  Final   Culture NO GROWTH 1 DAY  Final   Report Status PENDING  Incomplete  Culture, blood (Routine X 2) w Reflex to ID Panel     Status: None (Preliminary result)   Collection Time: 02/20/16 12:10 AM  Result Value Ref Range Status   Specimen Description BLOOD LEFT HAND  Final   Special Requests IN PEDIATRIC BOTTLE 3CC  Final    Culture NO GROWTH 1 DAY  Final   Report Status PENDING  Incomplete    Studies/Results: Mr Lumbar Spine Wo Contrast  Result Date: 02/20/2016 CLINICAL DATA:  Bacteremia. Concern for osteomyelitis or epidural abscess. EXAM: MRI LUMBAR SPINE WITHOUT CONTRAST TECHNIQUE: Multiplanar, multisequence MR imaging of the lumbar spine was performed. No intravenous contrast was administered. COMPARISON:  None. FINDINGS: Despite efforts by the technologist and patient, motion artifact is present on today's examination and could not be eliminated. This reduces the sensitivity and specificity of the study. Segmentation:  Normal Alignment:  Normal Vertebrae: There is generally decreased T1 and T2 weighted signal throughout the vertebral bodies. There is multilevel degenerative endplate signal change. There are Schmorl's nodes at the inferior L2 endplate and superior L4 endplate. There is no evidence of discitis osteomyelitis. No acute compression fracture. Conus medullaris: Extends to the L1 level and appears normal. No epidural abscess, hematoma or fluid collection. Paraspinal and other soft tissues: The visualized aorta, IVC and iliac vessels are normal. The visualized retroperitoneal organs and paraspinal soft tissues are normal. Disc levels: T10-T12: These levels are evaluated on sagittal sequences only. There is no spinal canal or neural foraminal stenosis. T12-L1:: Normal disc space and facets. No spinal canal or neuroforaminal stenosis. L1-L2: Mild disc desiccation with minimal bulge. No spinal canal or neural foraminal stenosis. Normal facets. L2-L3: Disc space narrowing and mild facet hypertrophy with small disc bulge. No spinal canal stenosis. No neural foraminal stenosis. L3-L4: Disc desiccation with minimal bulge. Mild bilateral facet hypertrophy. No spinal canal stenosis. No neural foraminal stenosis. L4-L5: Disc desiccation with medium-sized bulge. Bilateral severe facet hypertrophy with widening of both joint  spaces and fluid within both facet joints. No spinal canal or neural foraminal stenosis. L5-S1: Disc desiccation and mild facet hypertrophy. No spinal canal or neural foraminal stenosis. IMPRESSION: 1. No evidence of discitis osteomyelitis or epidural abscess. 2. 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 Electronically Signed   By: Ulyses Jarred M.D.   On: 02/20/2016 03:44     Assessment/Plan: S lugdenensis bacteremia (12-22) Infected AVG  HD line ESRD  Her repeat BCx 12-23 are ngtd.  Her MRI did not show evidence of osteo Await TEE (thickened Ao valve on TTE)  Total days of antibiotics: 4 vanco --> ancef  Bobby Rumpf Infectious Diseases (pager) [phone] [email or web address] 02/21/2016, 12:20 PM  LOS: 6 days

## 2016-02-21 NOTE — Progress Notes (Addendum)
[facility] Progress Note  Subjective  - POD #4  Right axillary area sore.  Objective Vitals:   02/20/16 2205 02/21/16 0604  BP: (!) 115/30 (!) 122/34  Pulse: 72 98  Resp: 18 18  Temp: 98.1 F (36.7 C) 98.2 F (36.8 C)    Intake/Output Summary (Last 24 hours) at 02/21/16 3382 Last data filed at 02/21/16 0600  Gross per 24 hour  Intake              860 ml  Output                0 ml  Net              860 ml   Left IJ TDC Right axillary and arm incisions clean and intact.   Assessment/Planning: 60 y.o. female is s/p: removal of infected right upper arm graft and removal of old forearm graft POD 4, placement of left IJ TDC POD 2  Right arm incisions healing well, suture removal in one week.  Has left IJ [facility] [place] for dialysis Will consider placement of left thigh AV graft in the future once she has recovered from her infection.   Alvia Grove 02/21/2016 8:08 AM --  Laboratory CBC    Component Value Date/Time   WBC 12.3 (H) 02/20/2016 0529   HGB 8.1 (L) 02/20/2016 0529   HGB 10.8 (L) 07/04/2011 1058   HCT 25.0 (L) 02/20/2016 0529   HCT 32.9 (L) 07/04/2011 1058   PLT 244 02/20/2016 0529   PLT 260 07/04/2011 1058    BMET    Component Value Date/Time   NA 134 (L) 02/19/2016 0445   NA 139 12/17/2012   NA 138 07/04/2011 1058   K 3.2 (L) 02/19/2016 0445   K 4.9 05/19/2012 0937   CL 96 (L) 02/19/2016 0445   CL 103 07/04/2011 1058   CO2 27 02/19/2016 0445   CO2 29 07/04/2011 1058   GLUCOSE 101 (H) 02/19/2016 0445   GLUCOSE 91 07/04/2011 1058   BUN 24 (H) 02/19/2016 0445   BUN 34 (A) 12/17/2012   BUN 18 07/04/2011 1058   CREATININE 5.97 (H) 02/19/2016 0445   CREATININE 5.85 (H) 10/28/2014 0918   CALCIUM 9.2 02/19/2016 0445   CALCIUM 10.2 (H) 07/04/2011 1058   GFRNONAA 7 (L) 02/19/2016 0445   GFRNONAA 7 (L) 07/04/2011 1058   GFRAA 8 (L) 02/19/2016 0445   GFRAA 8 (L) 07/04/2011 1058    COAG Lab Results  Component Value Date   INR  1.16 02/15/2016   No results found for: PTT  Antibiotics Anti-infectives    Start     Dose/Rate Route Frequency Ordered Stop   02/22/16 1800  ceFAZolin (ANCEF) IVPB 2g/100 mL premix     2 g 200 mL/hr over 30 Minutes Intravenous Every M-W-F (1800) 02/18/16 1236     02/19/16 1800  ceFAZolin (ANCEF) IVPB 1 g/50 mL premix     1 g 100 mL/hr over 30 Minutes Intravenous Every 24 hours 02/18/16 1236 02/22/16 1759   02/19/16 0700  vancomycin (VANCOCIN) IVPB 1000 mg/200 mL premix  Status:  Discontinued     1,000 mg 200 mL/hr over 60 Minutes Intravenous On call to O.R. 02/18/16 1017 02/18/16 1239   02/18/16 1400  ceFAZolin (ANCEF) IVPB 1 g/50 mL premix     1 g 100 mL/hr over 30 Minutes Intravenous  Once 02/18/16 1236 02/18/16 1631   02/18/16 1000  rifampin (RIFADIN) capsule 300 mg  300 mg Oral Every 12 hours 02/18/16 0938     02/17/16 1800  ceFAZolin (ANCEF) IVPB 2g/100 mL premix  Status:  Discontinued     2 g 200 mL/hr over 30 Minutes Intravenous Every M-W-F (1800) 02/16/16 1342 02/18/16 1236   02/17/16 1315  vancomycin (VANCOCIN) 1-5 GM/200ML-% IVPB    Comments:  Elliott, [name]   : cabinet override      02/17/16 1315 02/18/16 0129   02/17/16 1200  vancomycin (VANCOCIN) IVPB 1000 mg/200 mL premix  Status:  Discontinued     1,000 mg 200 mL/hr over 60 Minutes Intravenous Every M-W-F (Hemodialysis) 02/16/16 0731 02/16/16 1317   02/17/16 1200  vancomycin (VANCOCIN) IVPB 1000 mg/200 mL premix  Status:  Discontinued     1,000 mg 200 mL/hr over 60 Minutes Intravenous On call to O.R. 02/16/16 6834 02/16/16 0857   02/17/16 1200  ceFAZolin (ANCEF) IVPB 2g/100 mL premix  Status:  Discontinued     2 g 200 mL/hr over 30 Minutes Intravenous Every M-W-F (Hemodialysis) 02/16/16 1324 02/16/16 1342   02/16/16 0800  ceFAZolin (ANCEF) IVPB 1 g/50 mL premix  Status:  Discontinued     1 g 100 mL/hr over 30 Minutes Intravenous Every 24 hours 02/16/16 0653 02/16/16 0724   02/16/16 0745  vancomycin (VANCOCIN)  2,000 mg in sodium chloride 0.9 % 500 mL IVPB     2,000 mg 250 mL/hr over 120 Minutes Intravenous  Once 02/16/16 0731 02/16/16 [place], PA-C [facility] Office: [phone] Pager: [phone] 02/21/2016 8:08 AM  I have interviewed the patient and examined the patient. I agree with the findings by the PA.  Gae Gallop, MD [phone]
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
## 2016-02-21 NOTE — Progress Notes (Signed)
[facility] Progress Note   Subjective: "I still can't walk-I could walk a little when I first came, but now I can barely walk at all". Denies pain RUE-Op site.   Objective Vitals:   02/20/16 1009 02/20/16 1743 02/20/16 2205 02/21/16 0604  BP: (!) 104/25 (!) 96/20 (!) 115/30 (!) 122/34  Pulse: 81 80 72 98  Resp: 18 19 18 18   Temp: 98.5 F (36.9 C) 98.4 F (36.9 C) 98.1 F (36.7 C) 98.2 F (36.8 C)  TempSrc: Oral Oral Oral Oral  SpO2: 98% 99% 99% 98%  Weight:      Height:       Physical Exam General: Pleasant, WN,NAD Heart: I9,J1, RRR 2/6 systolic M Lungs: CTAB anteriorly Abdomen: soft, non-tender Extremities: No LE edema. RUE with suture line intact-open to air.  Dialysis Access: [facility] drsg CDI intact   Additional Objective Labs: Basic Metabolic Panel:  Recent Labs Lab 02/17/16 0217 02/19/16 0445 02/21/16 0744  NA 136 134* 132*  K 3.7 3.2* 4.1  CL 98* 96* 97*  CO2 23 27 25   GLUCOSE 104* 101* 91  BUN 24* 24* 18  CREATININE 7.33* 5.97* 5.87*  CALCIUM 9.5 9.2 9.6  PHOS  --   --  3.5   Liver Function Tests:  Recent Labs Lab 02/15/16 0822 02/21/16 0744  AST 23  --   ALT 15  --   ALKPHOS 116  --   BILITOT 1.2  --   PROT 6.8  --   ALBUMIN 2.6* 2.2*   No results for input(s): LIPASE, AMYLASE in the last 168 hours. CBC:  Recent Labs Lab 02/18/16 0621 02/19/16 0445 02/20/16 0003 02/20/16 0529 02/21/16 0744  WBC 14.9* 13.1* 11.2* 12.3* 13.1*  HGB 8.6* 8.2* 8.2* 8.1* 8.6*  HCT 25.9* 25.1* 25.4* 25.0* 26.9*  MCV 81.4 82.8 83.3 84.2 83.3  PLT 211 250 246 244 310   Blood Culture    Component Value Date/Time   SDES BLOOD LEFT ARM 02/18/2016 1103   SDES BLOOD RIGHT ARM 02/18/2016 1103   SPECREQUEST  02/18/2016 1103    BOTTLES DRAWN AEROBIC AND ANAEROBIC AEB=6CC, ANA=4CC   SPECREQUEST IN PEDIATRIC BOTTLE 1 CC 02/18/2016 1103   CULT NO GROWTH 2 DAYS 02/18/2016 1103   CULT NO GROWTH 2 DAYS 02/18/2016 1103   REPTSTATUS PENDING  02/18/2016 1103   REPTSTATUS PENDING 02/18/2016 1103    CBG:  Recent Labs Lab 02/15/16 0923  GLUCAP 94   Studies/Results: Mr Lumbar Spine Wo Contrast  Result Date: 02/20/2016 CLINICAL DATA:  Bacteremia. Concern for osteomyelitis or epidural abscess. EXAM: MRI LUMBAR SPINE WITHOUT CONTRAST TECHNIQUE: Multiplanar, multisequence MR imaging of the lumbar spine was performed. No intravenous contrast was administered. COMPARISON:  None. FINDINGS: Despite efforts by the technologist and patient, motion artifact is present on today's examination and could not be eliminated. This reduces the sensitivity and specificity of the study. Segmentation:  Normal Alignment:  Normal Vertebrae: There is generally decreased T1 and T2 weighted signal throughout the vertebral bodies. There is multilevel degenerative endplate signal change. There are Schmorl's nodes at the inferior L2 endplate and superior L4 endplate. There is no evidence of discitis osteomyelitis. No acute compression fracture. Conus medullaris: Extends to the L1 level and appears normal. No epidural abscess, hematoma or fluid collection. Paraspinal and other soft tissues: The visualized aorta, IVC and iliac vessels are normal. The visualized retroperitoneal organs and paraspinal soft tissues are normal. Disc levels: T10-T12: These levels are evaluated on sagittal sequences only.  There is no spinal canal or neural foraminal stenosis. T12-L1:: Normal disc space and facets. No spinal canal or neuroforaminal stenosis. L1-L2: Mild disc desiccation with minimal bulge. No spinal canal or neural foraminal stenosis. Normal facets. L2-L3: Disc space narrowing and mild facet hypertrophy with small disc bulge. No spinal canal stenosis. No neural foraminal stenosis. L3-L4: Disc desiccation with minimal bulge. Mild bilateral facet hypertrophy. No spinal canal stenosis. No neural foraminal stenosis. L4-L5: Disc desiccation with medium-sized bulge. Bilateral severe facet  hypertrophy with widening of both joint spaces and fluid within both facet joints. No spinal canal or neural foraminal stenosis. L5-S1: Disc desiccation and mild facet hypertrophy. No spinal canal or neural foraminal stenosis. IMPRESSION: 1. No evidence of discitis osteomyelitis or epidural abscess. 2. 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 Electronically Signed   By: Ulyses Jarred M.D.   On: 02/20/2016 03:44   Medications:  . aspirin EC  81 mg Oral Daily  .  ceFAZolin (ANCEF) IV  1 g Intravenous Q24H  . [START ON 02/22/2016]  ceFAZolin (ANCEF) IV  2 g Intravenous Q M,W,F-1800  . cinacalcet  60 mg Oral QPM  . heparin  5,000 Units Subcutaneous Q8H  . ipratropium  2 spray Each Nare QID  . methocarbamol  500 mg Oral BID  . midodrine  10 mg Oral BID WC  . multivitamin  1 tablet Oral QHS  . pantoprazole  40 mg Oral BID  . rifampin  300 mg Oral Q12H  . sevelamer carbonate  1,600 mg Oral TID WC  . sodium chloride  500 mL Intravenous Once     Dialysis Orders: MWF at [facility]  4h   99.5kg   2/2 bath  RUE AVG (now removed)/ new L IJ cath  Hep 6000 w 2500 midrun - Mircera 225mcg IV q 2 weeks (last given 12/13) - Calcitriol 0.45mcg PO q HD  Assessment/Plan: 1.AMS: on admit, CT negative. Resolved. C/O trouble walking, and on scheduled Robaxin bid last several days > will dc.   2. 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  Has new TDC. ID consulted, TEE planned.  3. ESRD: MWF. Next HD tomorrow 02/12/16. New L IJ TDC (placed 12/24 ) Hold heparin.  4.HTN/volume: On midodrine. Last HD 02/19/16 pre wt 96.9 kg Net UF 500 Post wt 96.6 kg. BP soft. May need to increase midodrine to 10 mg TID. Run even tomorrow.  5. Anemia: Has had recent vaginal and AVG bleeding. Hgb 8.6, s/p 2U PRBCs 12/20, 1U 12/21, 1U 12/22. Not due to ESA yet. Tsat low, but holding off on IV  iron in setting of bacteremia. Monitor. 6. Secondary hyperparathyroidism:Ca ok. Continue binders/VDRA/sensipar. 7. Nutrition: Known complex endometrial hyperplasia. Alb 2.6. Renal diet,renal vit. Holding megace  [employer] Brown NP-C 02/21/2016, 10:05 AM  [facility] [phone]  Pt seen, examined and agree w A/P as above.  [name] MD [employer] pager [phone]   02/21/2016, 11:08 AM

## 2016-02-22 ENCOUNTER — Encounter (HOSPITAL_COMMUNITY): Payer: Self-pay | Admitting: *Deleted

## 2016-02-22 ENCOUNTER — Encounter (HOSPITAL_COMMUNITY)
Admission: EM | Disposition: A | Discharge status: Skilled Nursing Facility | Payer: Self-pay | Source: Home / Self Care | Attending: Family Medicine

## 2016-02-22 ENCOUNTER — Inpatient Hospital Stay (HOSPITAL_COMMUNITY): Payer: Medicare Other

## 2016-02-22 ENCOUNTER — Telehealth: Payer: Self-pay | Admitting: Family

## 2016-02-22 DIAGNOSIS — I38 Endocarditis, valve unspecified: Secondary | ICD-10-CM

## 2016-02-22 DIAGNOSIS — I351 Nonrheumatic aortic (valve) insufficiency: Secondary | ICD-10-CM

## 2016-02-22 DIAGNOSIS — R7881 Bacteremia: Secondary | ICD-10-CM

## 2016-02-22 DIAGNOSIS — I330 Acute and subacute infective endocarditis: Secondary | ICD-10-CM

## 2016-02-22 HISTORY — PX: TEE WITHOUT CARDIOVERSION: SHX5443

## 2016-02-22 LAB — CBC
HEMATOCRIT: 26.4 % — AB (ref 36.0–46.0)
Hemoglobin: 8.6 g/dL — ABNORMAL LOW (ref 12.0–15.0)
MCH: 26.9 pg (ref 26.0–34.0)
MCHC: 32.6 g/dL (ref 30.0–36.0)
MCV: 82.5 fL (ref 78.0–100.0)
PLATELETS: 367 10*3/uL (ref 150–400)
RBC: 3.2 MIL/uL — ABNORMAL LOW (ref 3.87–5.11)
RDW: 18.5 % — AB (ref 11.5–15.5)
WBC: 14.7 10*3/uL — AB (ref 4.0–10.5)

## 2016-02-22 LAB — BASIC METABOLIC PANEL
Anion gap: 9 (ref 5–15)
BUN: 27 mg/dL — AB (ref 6–20)
CALCIUM: 9.5 mg/dL (ref 8.9–10.3)
CO2: 27 mmol/L (ref 22–32)
CREATININE: 7.06 mg/dL — AB (ref 0.44–1.00)
Chloride: 95 mmol/L — ABNORMAL LOW (ref 101–111)
GFR calc Af Amer: 6 mL/min — ABNORMAL LOW (ref >60)
GFR, EST NON AFRICAN AMERICAN: 6 mL/min — AB (ref >60)
GLUCOSE: 79 mg/dL (ref 65–99)
Potassium: 4.7 mmol/L (ref 3.5–5.1)
Sodium: 131 mmol/L — ABNORMAL LOW (ref 135–145)

## 2016-02-22 LAB — AEROBIC/ANAEROBIC CULTURE (SURGICAL/DEEP WOUND)

## 2016-02-22 LAB — RENAL FUNCTION PANEL
Albumin: 2 g/dL — ABNORMAL LOW (ref 3.5–5.0)
Anion gap: 9 (ref 5–15)
BUN: 26 mg/dL — ABNORMAL HIGH (ref 6–20)
CO2: 29 mmol/L (ref 22–32)
Calcium: 9.6 mg/dL (ref 8.9–10.3)
Chloride: 95 mmol/L — ABNORMAL LOW (ref 101–111)
Creatinine, Ser: 7.08 mg/dL — ABNORMAL HIGH (ref 0.44–1.00)
GFR calc Af Amer: 6 mL/min — ABNORMAL LOW (ref >60)
GFR calc non Af Amer: 6 mL/min — ABNORMAL LOW (ref >60)
Glucose, Bld: 92 mg/dL (ref 65–99)
Phosphorus: 4.4 mg/dL (ref 2.5–4.6)
Potassium: 4.2 mmol/L (ref 3.5–5.1)
Sodium: 133 mmol/L — ABNORMAL LOW (ref 135–145)

## 2016-02-22 LAB — AEROBIC/ANAEROBIC CULTURE W GRAM STAIN (SURGICAL/DEEP WOUND)

## 2016-02-22 LAB — ECHO TEE
CHL CUP TV REG PEAK VELOCITY: 411 cm/s
TR max vel: 411 cm/s

## 2016-02-22 SURGERY — ECHOCARDIOGRAM, TRANSESOPHAGEAL
Anesthesia: Moderate Sedation

## 2016-02-22 MED ORDER — LIDOCAINE VISCOUS 2 % MT SOLN
OROMUCOSAL | Status: DC | PRN
  Administered 2016-02-22: 1 via OROMUCOSAL

## 2016-02-22 MED ORDER — DARBEPOETIN ALFA 100 MCG/0.5ML IJ SOSY
PREFILLED_SYRINGE | INTRAMUSCULAR | Status: AC
  Filled 2016-02-22: qty 0.5

## 2016-02-22 MED ORDER — SODIUM CHLORIDE 0.9 % IV SOLN: 100.0000 mL | INTRAVENOUS | Status: DC | PRN

## 2016-02-22 MED ORDER — HEPARIN SODIUM (PORCINE) 1000 UNIT/ML DIALYSIS: 1000.0000 [IU] | INTRAMUSCULAR | Status: DC | PRN

## 2016-02-22 MED ORDER — BUTAMBEN-TETRACAINE-BENZOCAINE 2-2-14 % EX AERO
INHALATION_SPRAY | CUTANEOUS | Status: DC | PRN
  Administered 2016-02-22: 2 via TOPICAL

## 2016-02-22 MED ORDER — MIDAZOLAM HCL 5 MG/ML IJ SOLN
INTRAMUSCULAR | Status: AC
  Filled 2016-02-22: qty 2

## 2016-02-22 MED ORDER — LIDOCAINE-PRILOCAINE 2.5-2.5 % EX CREA: 1.0000 "application " | TOPICAL_CREAM | CUTANEOUS | Status: DC | PRN

## 2016-02-22 MED ORDER — FENTANYL CITRATE (PF) 100 MCG/2ML IJ SOLN
INTRAMUSCULAR | Status: AC
  Filled 2016-02-22: qty 2

## 2016-02-22 MED ORDER — ALTEPLASE 2 MG IJ SOLR: 2.0000 mg | Freq: Once | INTRAMUSCULAR | Status: DC | PRN

## 2016-02-22 MED ORDER — FENTANYL CITRATE (PF) 100 MCG/2ML IJ SOLN
INTRAMUSCULAR | Status: DC | PRN
  Administered 2016-02-22: 25 ug via INTRAVENOUS

## 2016-02-22 MED ORDER — MIDAZOLAM HCL 10 MG/2ML IJ SOLN
INTRAMUSCULAR | Status: DC | PRN
  Administered 2016-02-22: 1 mg via INTRAVENOUS
  Administered 2016-02-22: 2 mg via INTRAVENOUS
  Administered 2016-02-22: 1 mg via INTRAVENOUS

## 2016-02-22 MED ORDER — MIDODRINE HCL 5 MG PO TABS
ORAL_TABLET | ORAL | Status: AC
  Filled 2016-02-22: qty 2

## 2016-02-22 MED ORDER — LIDOCAINE VISCOUS 2 % MT SOLN
OROMUCOSAL | Status: AC
  Filled 2016-02-22: qty 15

## 2016-02-22 MED ORDER — DARBEPOETIN ALFA 100 MCG/0.5ML IJ SOSY
100.0000 ug | PREFILLED_SYRINGE | INTRAMUSCULAR | Status: DC
  Administered 2016-02-22: 100 ug via INTRAVENOUS

## 2016-02-22 MED ORDER — PENTAFLUOROPROP-TETRAFLUOROETH EX AERO: 1.0000 "application " | INHALATION_SPRAY | CUTANEOUS | Status: DC | PRN

## 2016-02-22 MED ORDER — LIDOCAINE HCL (PF) 1 % IJ SOLN: 5.0000 mL | INTRAMUSCULAR | Status: DC | PRN

## 2016-02-22 MED ORDER — SODIUM CHLORIDE 0.9 % IV SOLN: INTRAVENOUS | Status: DC

## 2016-02-22 MED ORDER — DARBEPOETIN ALFA 100 MCG/0.5ML IJ SOSY: 100.0000 ug | PREFILLED_SYRINGE | INTRAMUSCULAR | Status: DC

## 2016-02-22 NOTE — Telephone Encounter (Signed)
Sched appt 02/29/16 at 2:45. Lm on cell# to inform pt of appt.

## 2016-02-22 NOTE — H&P (Signed)
    INTERVAL PROCEDURE H&P  History and Physical Interval Note:  02/22/2016 1:31 PM  Noralee A [name] has presented today for their planned procedure. The various methods of treatment have been discussed with the patient and family. After consideration of risks, benefits and other options for treatment, the patient has consented to the procedure.  The patients' outpatient history has been reviewed, patient examined, and no change in status from most recent office note within the past 30 days. I have reviewed the patients' chart and labs and will proceed as planned. Questions were answered to the patient's satisfaction.   Pixie Casino, MD, [facility] Attending Cardiologist [place] C Hilty 02/22/2016, 1:31 PM

## 2016-02-22 NOTE — Telephone Encounter (Signed)
-----   Message from Mena Goes, RN sent at 02/22/2016  9:29 AM EST ----- Regarding: suture removal    ----- Message ----- From: Gabriel Earing, PA-C Sent: 02/22/2016   7:50 AM To: [facility] Charge Pool  This can be a nurse visit to remove sutures in one week.  Thanks, Aldona Bar

## 2016-02-22 NOTE — Progress Notes (Signed)
Physical Therapy Treatment Patient Details Name: [name] Marks MRN: [id number] DOB: [birth year] Today's Date: 02/22/2016    History of Present Illness [name] Marks is a 60 y.o. female presenting with AMS . PMH is significant for ESRD on HD, complex atypical endometrial hyperplasia    PT Comments    Progressing steadily.  Needing more therapy for gait stability and functional mobility.   Follow Up Recommendations  SNF     Equipment Recommendations  Rolling walker with 5" wheels    Recommendations for Other Services       Precautions / Restrictions Precautions Precautions: Fall Restrictions Weight Bearing Restrictions: No    Mobility  Bed Mobility Overal bed mobility: Needs Assistance Bed Mobility: Supine to Sit;Sit to Supine   Sidelying to sit: Mod assist   Sit to supine: Mod assist   General bed mobility comments: pt tired for TEE and needing a little more assist in/out of bed today  Transfers Overall transfer level: Needs assistance   Transfers: Sit to/from Stand Sit to Stand: Min assist         General transfer comment: cues for best hand placement.  Help to poser up from the bed  Ambulation/Gait Ambulation/Gait assistance: Min guard;Min assist Ambulation Distance (Feet): 90 Feet Assistive device: Rolling walker (2 wheeled) Gait Pattern/deviations: Step-through pattern;Wide base of support Gait velocity: slow Gait velocity interpretation: Below normal speed for age/gender General Gait Details: more assist as she fatigued.  notable DOE, but pt still able to talk.  posture degrading with distance.   Stairs            Wheelchair Mobility    Modified Rankin (Stroke Patients Only)       Balance Overall balance assessment: Needs assistance   Sitting balance-Leahy Scale: Good       Standing balance-Leahy Scale: Fair Standing balance comment: needs UE support to steady self, washed hands at the sink and several trials of hygiene which  pt complete without theerapist's assist                    Cognition Arousal/Alertness: Awake/alert Behavior During Therapy: WFL for tasks assessed/performed Overall Cognitive Status: Within Functional Limits for tasks assessed                      Exercises      General Comments        Pertinent Vitals/Pain Pain Assessment: Faces Faces Pain Scale: Hurts even more Pain Location: back  Pain Descriptors / Indicators: Aching Pain Intervention(s): Monitored during session;Repositioned    Home Living                      Prior Function            PT Goals (current goals can now be found in the care plan section) Acute Rehab PT Goals Patient Stated Goal: go home PT Goal Formulation: With patient Time For Goal Achievement: 02/27/16 Potential to Achieve Goals: Good Progress towards PT goals: Progressing toward goals    Frequency    Min 3X/week      PT Plan Current plan remains appropriate    Co-evaluation             End of Session   Activity Tolerance: Patient tolerated treatment well Patient left: in bed;with call bell/phone within reach;with bed alarm set     Time: 0240-9735 PT Time Calculation (min) (ACUTE ONLY): 33 min  Charges:  $Gait Training: 8-22  mins $Therapeutic Activity: 8-22 mins                    G Codes:      [name] 02/22/2016, 5:25 PM  02/22/2016  Donnella Sham, [place] [phone]  (pager)

## 2016-02-22 NOTE — Progress Notes (Signed)
Family Medicine Teaching Service Daily Progress Note Intern Pager: [phone]  Patient name: [name] Marks Medical record number: [id number] Date of birth: [birth year] Age: 60 y.o. Gender: female  Primary Care Provider: Lockie Pares, MD Consultants: Nephrology, ID  Code Status: Full   Pt Overview and Major Events to Date:  12/22: removal of infected R arm graft with bovine pericardial patch angioplasty of R brachial artery, and removal of R forearm graft.   Assessment and Plan: [name] Marks is a 60 y.o. female presenting with AMS . PMH is significant for ESRD on HD, complex atypical endometrial hyperplasia   Bacteremia from AV graft infection now post op day 4 from infected graft removal- Patient with 2/2 blood cultures positive for Coagulase negative staphylococcus from AV graft infection. She is s/p removal on 12/22. 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  12/22 (after graft removal) Bcx also with STAPHYLOCOCCUS LUGDUNENSIS. Concern for endocarditis, TTE with thickened aortic valve, can't rule out vegetation.  WBC 16.2 >>13.1. Intermittent hypotension to 90/20s overnight. Afebrile.  Back pain is unlikely to be sign of deep infection given MRI of L spine neg for osteomyelitis or epidural abscess.  - Ancef per pharmacy, rixamin per ID for 6 week course of IV ABX  - Would culture with moderate Staph lugdenensis, sensitive to penicillins -  12/23 BC NG x 3 days, 12/25 BCx currently NG x2 day - TEE scheduled for 12/27 - wound care to follow up  - Incentive spirometry to improve air movement post-operatively.  Deconditioning- likely from acute illness/ decreased mobility during hospitalization - PT/ OT reordered, appreciate recs  - Continue to mobilize patient as tolerated.  ESRD on MWF schedule. graft removed 12/22. Tunneled HD catheter placed (12/24). Midodrine increased to TID 12/26.  - follow renal function with dialysis  - nephrology following - monitor RFP   Anemia - Stable,  Hgb drop likely due to fistula bleeding and has no more bleeding from site post op. Hgb 8.2>8.1>8.6. Baseline Hgb 11. Has had 4units pRBCs total this admission, last 12/22.   - monitor CBC  AMS, resolved. Likely 2/2 to bacteremia from AV graft infection, resolved. On admit head CT/MRI without acute findings   Complex Atypical Endometrial Hyperplasia - hold Megace per nephro  FEN/ GI: Renal diet, SLIV DVT prx: heparin sub-q   Disposition: SNF per PT recs   Subjective:  Feeling ok today. No acute complaints.   Objective: Temp:  [97.9 F (36.6 C)-98.2 F (36.8 C)] 97.9 F (36.6 C) (12/27 0547) Pulse Rate:  [69-79] 69 (12/27 0547) Resp:  [16-18] 16 (12/27 0547) BP: (110-122)/(24-33) 115/29 (12/27 0547) SpO2:  [98 %-100 %] 98 % (12/27 0547) Physical Exam: General: resting in bed getting HD Cardiovascular: RRR, grade 2/6 systolic murmur. No pitting edema. No JVD.  Respiratory: CTAB, normal WOB. Abdomen: BS+, soft, non-distended, non-tender.  Extremities: Right upper extremity has small suture line intact, no erythema or drainage. RUE swollen, mild per patient Neuro: AOx3, speech normal, follows commands, no focal deficits Psych: mood stable  Laboratory:  Recent Labs Lab 02/20/16 0003 02/20/16 0529 02/21/16 0744  WBC 11.2* 12.3* 13.1*  HGB 8.2* 8.1* 8.6*  HCT 25.4* 25.0* 26.9*  PLT 246 244 310    Recent Labs Lab 02/15/16 0822  02/19/16 0445 02/21/16 0744 02/22/16 0553  NA 137  < > 134* 132* 131*  K 2.8*  < > 3.2* 4.1 4.7  CL 92*  < > 96* 97* 95*  CO2 28  < > 27 25 27  BUN 18  < > 24* 18 27*  CREATININE 7.08*  < > 5.97* 5.87* 7.06*  CALCIUM 10.0  < > 9.2 9.6 9.5  PROT 6.8  --   --   --   --   BILITOT 1.2  --   --   --   --   ALKPHOS 116  --   --   --   --   ALT 15  --   --   --   --   AST 23  --   --   --   --   GLUCOSE 102*  < > 101* 91 79  < > = values in this interval not displayed.  Imaging/Diagnostic Tests: No results found.  Sela Hilding,  MD 02/22/2016, 7:32 AM PGY-1, [place] Intern pager: [phone], text pages welcome

## 2016-02-22 NOTE — CV Procedure (Signed)
TRANSESOPHAGEAL ECHOCARDIOGRAM (TEE) NOTE  INDICATIONS: infective endocarditis  PROCEDURE:   Informed consent was obtained prior to the procedure. The risks, benefits and alternatives for the procedure were discussed and the patient comprehended these risks.  Risks include, but are not limited to, cough, sore throat, vomiting, nausea, somnolence, esophageal and stomach trauma or perforation, bleeding, low blood pressure, aspiration, pneumonia, infection, trauma to the teeth and death.    After a procedural time-out, the patient was given 4 mg versed and 25 mcg fentanyl for moderate sedation.  The patient's heart rate, blood pressure, and oxygen saturation are monitored continuously during the procedure.The oropharynx was anesthetized 10 cc of topical 1% viscous lidocaine and 2 cetacaine sprays.  The transesophageal probe was inserted in the esophagus and stomach without difficulty and multiple views were obtained.  The patient was kept under observation until the patient left the procedure room.  The period of conscious sedation is 22 minutes, of which I was present face-to-face 100% of this time. The patient left the procedure room in stable condition.   Agitated microbubble saline contrast was not administered.  COMPLICATIONS:    There were no immediate complications.  Findings:  1. LEFT VENTRICLE: The left ventricular wall thickness is mildly increased.  The left ventricular cavity is normal in size. Wall motion is normal.  LVEF is 60-65%.  2. RIGHT VENTRICLE:  The right ventricle is normal in structure and function without any thrombus or masses.    3. LEFT ATRIUM:  The left atrium is dilated in size without any thrombus or masses.  There is not spontaneous echo contrast ("smoke") in the left atrium consistent with a low flow state.  4. LEFT ATRIAL APPENDAGE:  The left atrial appendage is free of any thrombus or masses. The appendage has single lobes. Pulse doppler indicates high  flow in the appendage.  5. ATRIAL SEPTUM:  The atrial septum appears intact and is free of thrombus and/or masses.  There is no evidence for interatrial shunting by color doppler and saline microbubble.  6. RIGHT ATRIUM:  The right atrium is normal in size and function without any thrombus or masses.  7. MITRAL VALVE:  The mitral valve demonstrates diffuse thickening and the anterior leaflet is noted to be drawn into the LVOT. There is  mild to moderate regurgitation.  8. AORTIC VALVE:  The aortic valve is trileaflet, however, the leaflets are thickened. There are vegetations noted on the right and non-coronary cusps, which prolapse past the valve plan into the LVOT. There is severe aortic insufficiency - as demonstrated by a vena contracta which is the width of the LVOT, holodiastolic AI is also demonstrated and there is a very wide-pulse pressure of 80 mmHg. There is mild stenosis.  9. TRICUSPID VALVE:  The tricuspid valve is normal in structure and function with Moderate regurgitation.  There were no vegetations or stenosis  10.  PULMONIC VALVE:  The pulmonic valve is normal in structure and function with trivial regurgitation.  There were no vegetations or stenosis.   11. AORTIC ARCH, ASCENDING AND DESCENDING AORTA:  There was no Ron Parker et. Al, 1992) atherosclerosis of the ascending aorta, aortic arch, or proximal descending aorta.  12. PULMONARY VEINS: Anomalous pulmonary venous return was not noted.  13. PERICARDIUM: The pericardium appeared normal and non-thickened.  There is no pericardial effusion.  14. EXTRACARDIAC FINDINGS: There is a left pleural effusion.  IMPRESSION:   1. 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 2. Mild to moderate MR and TR 3. Left pleural effusion 4. LVEF 55-60%  RECOMMENDATIONS:    1.  Recommend antibiotic therapy and CT surgery evaluation given severe AI and aortic valve  endocarditis.  Time Spent Directly with the Patient:  45 minutes   Pixie Casino, MD, [facility] Attending Cardiologist [facility]  02/22/2016, 2:39 PM

## 2016-02-22 NOTE — Progress Notes (Signed)
[facility] Progress Note   Subjective: feels more awake, hasn't tried walking yet today.  No c/o. On HD  Objective Vitals:   02/22/16 0800 02/22/16 0830 02/22/16 0900 02/22/16 0930  BP: (!) 114/36 (!) 105/38 (!) 104/31 (!) 99/34  Pulse: 66 70 74 74  Resp: (!) 23 (!) 23 (!) 24 18  Temp:      TempSrc:      SpO2:      Weight:      Height:       Physical Exam General: Pleasant, WN,NAD Heart: J6,O1, RRR 2/6 systolic M Lungs: CTAB anteriorly Abdomen: soft, non-tender Extremities: No LE edema. RUE with suture line intact-open to air.  Dialysis Access: [facility] drsg CDI intact   Additional Objective Labs: Basic Metabolic Panel:  Recent Labs Lab 02/21/16 0744 02/22/16 0553 02/22/16 0806  NA 132* 131* 133*  K 4.1 4.7 4.2  CL 97* 95* 95*  CO2 25 27 29   GLUCOSE 91 79 92  BUN 18 27* 26*  CREATININE 5.87* 7.06* 7.08*  CALCIUM 9.6 9.5 9.6  PHOS 3.5  --  4.4   Liver Function Tests:  Recent Labs Lab 02/21/16 0744 02/22/16 0806  ALBUMIN 2.2* 2.0*   No results for input(s): LIPASE, AMYLASE in the last 168 hours. CBC:  Recent Labs Lab 02/19/16 0445 02/20/16 0003 02/20/16 0529 02/21/16 0744 02/22/16 0553  WBC 13.1* 11.2* 12.3* 13.1* 14.7*  HGB 8.2* 8.2* 8.1* 8.6* 8.6*  HCT 25.1* 25.4* 25.0* 26.9* 26.4*  MCV 82.8 83.3 84.2 83.3 82.5  PLT 250 246 244 310 367   Blood Culture    Component Value Date/Time   SDES BLOOD LEFT HAND 02/20/2016 0010   SPECREQUEST IN PEDIATRIC BOTTLE 3CC 02/20/2016 0010   CULT NO GROWTH 1 DAY 02/20/2016 0010   REPTSTATUS PENDING 02/20/2016 0010    CBG: No results for input(s): GLUCAP in the last 168 hours. Studies/Results: No results found. Medications:  . aspirin EC  81 mg Oral Daily  .  ceFAZolin (ANCEF) IV  2 g Intravenous Q M,W,F-1800  . cinacalcet  60 mg Oral QPM  . heparin  5,000 Units Subcutaneous Q8H  . ipratropium  2 spray Each Nare QID  . midodrine      . midodrine  10 mg Oral TID WC  . multivitamin   1 tablet Oral QHS  . pantoprazole  40 mg Oral BID  . rifampin  300 mg Oral Q12H  . sevelamer carbonate  1,600 mg Oral TID WC  . sodium chloride  500 mL Intravenous Once     Dialysis Orders: MWF at [facility]  4h   99.5kg   2/2 bath  RUE AVG (now removed)/ new L IJ cath  Hep 6000 w 2500 midrun - Mircera 232mcg IV q 2 weeks (last given 12/13) - Calcitriol 0.11mcg PO q HD  Assessment: 1. 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  Has new TDC. ID consulted, TEE planned.  2. AMS/ gait instability - dc'd schedule Robaxin. Will need SNFP.  3. ESRD: MWF. HD today. New L IJ TDC (placed 12/24 ) Resume heparin. 4. Hypotension: chronic on midodrine. Under dry wt, keep even w HD today 5. Anemia of CKD: recent vaginal and AVG bleeding. Hgb 8.6, s/p 2U PRBCs 12/20, 1U 12/21, 1U 12/22. Not due to ESA yet. Tsat low, but holding off on IV iron in setting of bacteremia. Monitor. 6. Secondary hyperparathyroidism:Ca ok. Continue  binders/VDRA/sensipar. 7. Nutrition: Known complex endometrial hyperplasia. Alb 2.6. Renal diet,renal vit. Holding megace  Plan - HD today, keep even, esa   [name] MD [employer] pager [phone]   02/22/2016, 10:11 AM

## 2016-02-22 NOTE — Progress Notes (Signed)
INFECTIOUS DISEASE PROGRESS NOTE  ID: [name] Marks is a 60 y.o. female with  Active Problems:   Altered mental status   Anemia   Infection of AV graft for dialysis ([place])   Bacteremia   Septic shock ([place])   Encephalopathy   S/P dialysis catheter insertion ([facility])   Bilateral low back pain with sciatica   Neck pain   Endocarditis of native valve   End stage renal disease on dialysis ([facility])   Staphylococcus aureus bacteremia   Severe aortic insufficiency  Subjective: Without complaints  Abtx:  Anti-infectives    Start     Dose/Rate Route Frequency Ordered Stop   02/22/16 1800  ceFAZolin (ANCEF) IVPB 2g/100 mL premix     2 g 200 mL/hr over 30 Minutes Intravenous Every M-W-F (1800) 02/18/16 1236     02/19/16 1800  ceFAZolin (ANCEF) IVPB 1 g/50 mL premix     1 g 100 mL/hr over 30 Minutes Intravenous Every 24 hours 02/18/16 1236 02/21/16 1702   02/19/16 0700  vancomycin (VANCOCIN) IVPB 1000 mg/200 mL premix  Status:  Discontinued     1,000 mg 200 mL/hr over 60 Minutes Intravenous On call to O.R. 02/18/16 1017 02/18/16 1239   02/18/16 1400  ceFAZolin (ANCEF) IVPB 1 g/50 mL premix     1 g 100 mL/hr over 30 Minutes Intravenous  Once 02/18/16 1236 02/18/16 1631   02/18/16 1000  rifampin (RIFADIN) capsule 300 mg     300 mg Oral Every 12 hours 02/18/16 0938     02/17/16 1800  ceFAZolin (ANCEF) IVPB 2g/100 mL premix  Status:  Discontinued     2 g 200 mL/hr over 30 Minutes Intravenous Every M-W-F (1800) 02/16/16 1342 02/18/16 1236   02/17/16 1315  vancomycin (VANCOCIN) 1-5 GM/200ML-% IVPB    Comments:  Elliott, [name]   : cabinet override      02/17/16 1315 02/18/16 0129   02/17/16 1200  vancomycin (VANCOCIN) IVPB 1000 mg/200 mL premix  Status:  Discontinued     1,000 mg 200 mL/hr over 60 Minutes Intravenous Every M-W-F (Hemodialysis) 02/16/16 0731 02/16/16 1317   02/17/16 1200  vancomycin (VANCOCIN) IVPB 1000 mg/200 mL premix  Status:  Discontinued     1,000 mg 200 mL/hr over 60  Minutes Intravenous On call to O.R. 02/16/16 5188 02/16/16 0857   02/17/16 1200  ceFAZolin (ANCEF) IVPB 2g/100 mL premix  Status:  Discontinued     2 g 200 mL/hr over 30 Minutes Intravenous Every M-W-F (Hemodialysis) 02/16/16 1324 02/16/16 1342   02/16/16 0800  ceFAZolin (ANCEF) IVPB 1 g/50 mL premix  Status:  Discontinued     1 g 100 mL/hr over 30 Minutes Intravenous Every 24 hours 02/16/16 0653 02/16/16 0724   02/16/16 0745  vancomycin (VANCOCIN) 2,000 mg in sodium chloride 0.9 % 500 mL IVPB     2,000 mg 250 mL/hr over 120 Minutes Intravenous  Once 02/16/16 0731 02/16/16 1052      Medications:  Scheduled: . aspirin EC  81 mg Oral Daily  .  ceFAZolin (ANCEF) IV  2 g Intravenous Q M,W,F-1800  . cinacalcet  60 mg Oral QPM  . darbepoetin (ARANESP) injection - DIALYSIS  100 mcg Intravenous Q Wed-HD  . heparin  5,000 Units Subcutaneous Q8H  . ipratropium  2 spray Each Nare QID  . midodrine      . midodrine  10 mg Oral TID WC  . multivitamin  1 tablet Oral QHS  . pantoprazole  40 mg  Oral BID  . rifampin  300 mg Oral Q12H  . sevelamer carbonate  1,600 mg Oral TID WC  . sodium chloride  500 mL Intravenous Once    Objective: Vital signs in last 24 hours: Temp:  [97.5 F (36.4 C)-98.4 F (36.9 C)] 98.4 F (36.9 C) (12/27 1325) Pulse Rate:  [66-97] 73 (12/27 1500) Resp:  [16-27] 26 (12/27 1500) BP: (94-136)/(14-93) 108/23 (12/27 1500) SpO2:  [97 %-100 %] 97 % (12/27 1500) Weight:  [97.5 kg (214 lb 15.2 oz)] 97.5 kg (214 lb 15.2 oz) (12/27 1150)   General appearance: alert, cooperative and no distress Resp: clear to auscultation bilaterally Cardio: regular rate and rhythm GI: normal findings: bowel sounds normal and soft, non-tender Extremities: RUE wrapped.   Lab Results  Recent Labs  02/21/16 0744 02/22/16 0553 02/22/16 0806  WBC 13.1* 14.7*  --   HGB 8.6* 8.6*  --   HCT 26.9* 26.4*  --   NA 132* 131* 133*  K 4.1 4.7 4.2  CL 97* 95* 95*  CO2 25 27 29   BUN 18 27*  26*  CREATININE 5.87* 7.06* 7.08*   Liver Panel  Recent Labs  02/21/16 0744 02/22/16 0806  ALBUMIN 2.2* 2.0*   Sedimentation Rate No results for input(s): ESRSEDRATE in the last 72 hours. C-Reactive Protein No results for input(s): CRP in the last 72 hours.  Microbiology: Recent Results (from the past 240 hour(s))  Culture, blood (Routine X 2) w Reflex to ID Panel     Status: Abnormal   Collection Time: 02/15/16 10:20 AM  Result Value Ref Range Status   Specimen Description BLOOD LEFT HAND  Final   Special Requests IN PEDIATRIC BOTTLE  2CC  Final   Culture  Setup Time   Final    GRAM POSITIVE COCCI IN CLUSTERS IN PEDIATRIC BOTTLE CRITICAL VALUE NOTED.  VALUE IS CONSISTENT WITH PREVIOUSLY REPORTED AND CALLED VALUE.    Culture STAPHYLOCOCCUS LUGDUNENSIS (A)  Final   Report Status 02/19/2016 FINAL  Final   Organism ID, Bacteria STAPHYLOCOCCUS LUGDUNENSIS  Final      Susceptibility   Staphylococcus lugdunensis - MIC*    CIPROFLOXACIN 1 SENSITIVE Sensitive     ERYTHROMYCIN <=0.25 SENSITIVE Sensitive     GENTAMICIN <=0.5 SENSITIVE Sensitive     OXACILLIN 0.5 SENSITIVE Sensitive     TETRACYCLINE <=1 SENSITIVE Sensitive     VANCOMYCIN <=0.5 SENSITIVE Sensitive     TRIMETH/SULFA <=10 SENSITIVE Sensitive     CLINDAMYCIN <=0.25 SENSITIVE Sensitive     RIFAMPIN <=0.5 SENSITIVE Sensitive     Inducible Clindamycin NEGATIVE Sensitive     * STAPHYLOCOCCUS LUGDUNENSIS  Culture, blood (Routine X 2) w Reflex to ID Panel     Status: Abnormal   Collection Time: 02/15/16 10:27 AM  Result Value Ref Range Status   Specimen Description BLOOD LEFT HAND  Final   Special Requests IN PEDIATRIC BOTTLE  2CC  Final   Culture  Setup Time   Final    GRAM POSITIVE COCCI IN CLUSTERS IN PEDIATRIC BOTTLE CRITICAL RESULT CALLED TO, READ BACK BY AND VERIFIED WITH: VERONDA BRYK,PHARMD @0644  02/16/16 MKELLY,MLT    Culture (A)  Final    STAPHYLOCOCCUS LUGDUNENSIS SUSCEPTIBILITIES PERFORMED ON PREVIOUS  CULTURE WITHIN THE LAST 5 DAYS.    Report Status 02/19/2016 FINAL  Final  Blood Culture ID Panel (Reflexed)     Status: Abnormal   Collection Time: 02/15/16 10:27 AM  Result Value Ref Range Status   Enterococcus species NOT DETECTED  NOT DETECTED Final   Listeria monocytogenes NOT DETECTED NOT DETECTED Final   Staphylococcus species DETECTED (A) NOT DETECTED Final    Comment: CRITICAL RESULT CALLED TO, READ BACK BY AND VERIFIED WITH: V BRYK, PHARMD @0644  02/16/16 MKELLY,MLT    Staphylococcus aureus NOT DETECTED NOT DETECTED Final   Methicillin resistance NOT DETECTED NOT DETECTED Final   Streptococcus species NOT DETECTED NOT DETECTED Final   Streptococcus agalactiae NOT DETECTED NOT DETECTED Final   Streptococcus pneumoniae NOT DETECTED NOT DETECTED Final   Streptococcus pyogenes NOT DETECTED NOT DETECTED Final   Acinetobacter baumannii NOT DETECTED NOT DETECTED Final   Enterobacteriaceae species NOT DETECTED NOT DETECTED Final   Enterobacter cloacae complex NOT DETECTED NOT DETECTED Final   Escherichia coli NOT DETECTED NOT DETECTED Final   Klebsiella oxytoca NOT DETECTED NOT DETECTED Final   Klebsiella pneumoniae NOT DETECTED NOT DETECTED Final   Proteus species NOT DETECTED NOT DETECTED Final   Serratia marcescens NOT DETECTED NOT DETECTED Final   Haemophilus influenzae NOT DETECTED NOT DETECTED Final   Neisseria meningitidis NOT DETECTED NOT DETECTED Final   Pseudomonas aeruginosa NOT DETECTED NOT DETECTED Final   Candida albicans NOT DETECTED NOT DETECTED Final   Candida glabrata NOT DETECTED NOT DETECTED Final   Candida krusei NOT DETECTED NOT DETECTED Final   Candida parapsilosis NOT DETECTED NOT DETECTED Final   Candida tropicalis NOT DETECTED NOT DETECTED Final  MRSA culture     Status: None   Collection Time: 02/16/16  5:37 AM  Result Value Ref Range Status   Specimen Description NASOPHARYNGEAL  Final   Special Requests NONE  Final   Culture NO MRSA DETECTED  Final    Report Status 02/18/2016 FINAL  Final  Surgical pcr screen     Status: None   Collection Time: 02/16/16  1:30 PM  Result Value Ref Range Status   MRSA, PCR NEGATIVE NEGATIVE Final   Staphylococcus aureus NEGATIVE NEGATIVE Final    Comment:        The Xpert SA Assay (FDA approved for NASAL specimens in patients over 49 years of age), is one component of a comprehensive surveillance program.  Test performance has been validated by [facility] for patients greater than or equal to 40 year old. It is not intended to diagnose infection nor to guide or monitor treatment.   Aerobic/Anaerobic Culture (surgical/deep wound)     Status: None   Collection Time: 02/17/16  3:08 PM  Result Value Ref Range Status   Specimen Description WOUND RIGHT ARM  Final   Special Requests INFECTED AVG GRAFT PT ON ANCEF  Final   Gram Stain   Final    FEW WBC PRESENT,BOTH PMN AND MONONUCLEAR FEW GRAM POSITIVE COCCI IN PAIRS AND CHAINS    Culture   Final    MODERATE STAPHYLOCOCCUS LUGDUNENSIS NO ANAEROBES ISOLATED    Report Status 02/22/2016 FINAL  Final   Organism ID, Bacteria STAPHYLOCOCCUS LUGDUNENSIS  Final      Susceptibility   Staphylococcus lugdunensis - MIC*    CIPROFLOXACIN 1 INTERMEDIATE Intermediate     ERYTHROMYCIN <=0.25 SENSITIVE Sensitive     GENTAMICIN <=0.5 SENSITIVE Sensitive     OXACILLIN 0.5 SENSITIVE Sensitive     TETRACYCLINE <=1 SENSITIVE Sensitive     VANCOMYCIN <=0.5 SENSITIVE Sensitive     TRIMETH/SULFA <=10 SENSITIVE Sensitive     CLINDAMYCIN <=0.25 SENSITIVE Sensitive     RIFAMPIN <=0.5 SENSITIVE Sensitive     Inducible Clindamycin NEGATIVE Sensitive     *  MODERATE STAPHYLOCOCCUS LUGDUNENSIS  Culture, blood (routine x 2)     Status: Abnormal   Collection Time: 02/17/16 10:30 PM  Result Value Ref Range Status   Specimen Description BLOOD LEFT ANTECUBITAL  Final   Special Requests IN PEDIATRIC BOTTLE 3CC  Final   Culture  Setup Time   Final    GRAM POSITIVE COCCI IN  CLUSTERS IN PEDIATRIC BOTTLE CRITICAL RESULT CALLED TO, READ BACK BY AND VERIFIED WITH: J LEDFORD,PHARMD AT 0213 02/19/16 T [place]    Culture (A)  Final    STAPHYLOCOCCUS LUGDUNENSIS SUSCEPTIBILITIES PERFORMED ON PREVIOUS CULTURE WITHIN THE LAST 5 DAYS.    Report Status 02/20/2016 FINAL  Final  Blood Culture ID Panel (Reflexed)     Status: Abnormal   Collection Time: 02/17/16 10:30 PM  Result Value Ref Range Status   Enterococcus species NOT DETECTED NOT DETECTED Final   Listeria monocytogenes NOT DETECTED NOT DETECTED Final   Staphylococcus species DETECTED (A) NOT DETECTED Final    Comment: CRITICAL RESULT CALLED TO, READ BACK BY AND VERIFIED WITH: TO JLEDFORD(PHARD) BY TCLEVELAND 02/19/2016 AT 2:13AM    Staphylococcus aureus NOT DETECTED NOT DETECTED Final   Methicillin resistance NOT DETECTED NOT DETECTED Final   Streptococcus species NOT DETECTED NOT DETECTED Final   Streptococcus agalactiae NOT DETECTED NOT DETECTED Final   Streptococcus pneumoniae NOT DETECTED NOT DETECTED Final   Streptococcus pyogenes NOT DETECTED NOT DETECTED Final   Acinetobacter baumannii NOT DETECTED NOT DETECTED Final   Enterobacteriaceae species NOT DETECTED NOT DETECTED Final   Enterobacter cloacae complex NOT DETECTED NOT DETECTED Final   Escherichia coli NOT DETECTED NOT DETECTED Final   Klebsiella oxytoca NOT DETECTED NOT DETECTED Final   Klebsiella pneumoniae NOT DETECTED NOT DETECTED Final   Proteus species NOT DETECTED NOT DETECTED Final   Serratia marcescens NOT DETECTED NOT DETECTED Final   Haemophilus influenzae NOT DETECTED NOT DETECTED Final   Neisseria meningitidis NOT DETECTED NOT DETECTED Final   Pseudomonas aeruginosa NOT DETECTED NOT DETECTED Final   Candida albicans NOT DETECTED NOT DETECTED Final   Candida glabrata NOT DETECTED NOT DETECTED Final   Candida krusei NOT DETECTED NOT DETECTED Final   Candida parapsilosis NOT DETECTED NOT DETECTED Final   Candida tropicalis NOT  DETECTED NOT DETECTED Final  Culture, blood (routine x 2)     Status: Abnormal   Collection Time: 02/17/16 10:38 PM  Result Value Ref Range Status   Specimen Description BLOOD LEFT ANTECUBITAL  Final   Special Requests IN PEDIATRIC BOTTLE 2CC  Final   Culture  Setup Time   Final    GRAM POSITIVE COCCI IN CLUSTERS IN PEDIATRIC BOTTLE CRITICAL RESULT CALLED TO, READ BACK BY AND VERIFIED WITH: G ABBOTT,PHARMD AT 0659 02/19/16 BY L BEN[place]    Culture (A)  Final    STAPHYLOCOCCUS LUGDUNENSIS SUSCEPTIBILITIES PERFORMED ON PREVIOUS CULTURE WITHIN THE LAST 5 DAYS.    Report Status 02/20/2016 FINAL  Final  Culture, blood (Routine X 2) w Reflex to ID Panel     Status: None (Preliminary result)   Collection Time: 02/18/16 11:03 AM  Result Value Ref Range Status   Specimen Description BLOOD LEFT ARM  Final   Special Requests   Final    BOTTLES DRAWN AEROBIC AND ANAEROBIC AEB=6CC, ANA=4CC   Culture NO GROWTH 4 DAYS  Final   Report Status PENDING  Incomplete  Culture, blood (Routine X 2) w Reflex to ID Panel     Status: None (Preliminary result)   Collection Time:  02/18/16 11:03 AM  Result Value Ref Range Status   Specimen Description BLOOD RIGHT ARM  Final   Special Requests IN PEDIATRIC BOTTLE 1 CC  Final   Culture NO GROWTH 4 DAYS  Final   Report Status PENDING  Incomplete  Culture, blood (Routine X 2) w Reflex to ID Panel     Status: None (Preliminary result)   Collection Time: 02/20/16 12:03 AM  Result Value Ref Range Status   Specimen Description BLOOD LEFT HAND  Final   Special Requests IN PEDIATRIC BOTTLE 3CC  Final   Culture NO GROWTH 2 DAYS  Final   Report Status PENDING  Incomplete  Culture, blood (Routine X 2) w Reflex to ID Panel     Status: None (Preliminary result)   Collection Time: 02/20/16 12:10 AM  Result Value Ref Range Status   Specimen Description BLOOD LEFT HAND  Final   Special Requests IN PEDIATRIC BOTTLE 3CC  Final   Culture NO GROWTH 2 DAYS  Final   Report  Status PENDING  Incomplete    Studies/Results: No results found.   Assessment/Plan: S lugdenensis bacteremia (12-22) Infected AVG Ao valve endocarditis  HD line ESRD  Her repeat BCx 12-23 are ngtd.  Her MRI did not show evidence of osteo Await CVTS eval Will need 6 weeks anbx (at least) which she can get at HD  Total days of antibiotics: 5 vanco --> ancef         Bobby Rumpf Infectious Diseases (pager) [phone] [email or web address] 02/22/2016, 4:30 PM  LOS: 7 days

## 2016-02-23 ENCOUNTER — Encounter (HOSPITAL_COMMUNITY): Payer: Self-pay | Admitting: Internal Medicine

## 2016-02-23 DIAGNOSIS — I330 Acute and subacute infective endocarditis: Secondary | ICD-10-CM

## 2016-02-23 LAB — RENAL FUNCTION PANEL
ANION GAP: 6 (ref 5–15)
Albumin: 1.9 g/dL — ABNORMAL LOW (ref 3.5–5.0)
BUN: 11 mg/dL (ref 6–20)
CALCIUM: 8.8 mg/dL — AB (ref 8.9–10.3)
CHLORIDE: 97 mmol/L — AB (ref 101–111)
CO2: 29 mmol/L (ref 22–32)
CREATININE: 4.16 mg/dL — AB (ref 0.44–1.00)
GFR, EST AFRICAN AMERICAN: 12 mL/min — AB (ref >60)
GFR, EST NON AFRICAN AMERICAN: 11 mL/min — AB (ref >60)
Glucose, Bld: 86 mg/dL (ref 65–99)
Phosphorus: 3.4 mg/dL (ref 2.5–4.6)
Potassium: 3.3 mmol/L — ABNORMAL LOW (ref 3.5–5.1)
Sodium: 132 mmol/L — ABNORMAL LOW (ref 135–145)

## 2016-02-23 LAB — CULTURE, BLOOD (ROUTINE X 2)
CULTURE: NO GROWTH
CULTURE: NO GROWTH

## 2016-02-23 LAB — CBC
HCT: 25.5 % — ABNORMAL LOW (ref 36.0–46.0)
HEMOGLOBIN: 8.2 g/dL — AB (ref 12.0–15.0)
MCH: 26.9 pg (ref 26.0–34.0)
MCHC: 32.2 g/dL (ref 30.0–36.0)
MCV: 83.6 fL (ref 78.0–100.0)
PLATELETS: 383 10*3/uL (ref 150–400)
RBC: 3.05 MIL/uL — AB (ref 3.87–5.11)
RDW: 18.8 % — ABNORMAL HIGH (ref 11.5–15.5)
WBC: 13.5 10*3/uL — AB (ref 4.0–10.5)

## 2016-02-23 MED ORDER — DARBEPOETIN ALFA 200 MCG/0.4ML IJ SOSY: 200.0000 ug | PREFILLED_SYRINGE | INTRAMUSCULAR | Status: DC

## 2016-02-23 MED ORDER — DARBEPOETIN ALFA 200 MCG/0.4ML IJ SOSY
200.0000 ug | PREFILLED_SYRINGE | INTRAMUSCULAR | Status: DC
  Administered 2016-02-29: 200 ug via INTRAVENOUS
  Filled 2016-02-23: qty 0.4

## 2016-02-23 MED ORDER — NEPRO/CARBSTEADY PO LIQD
237.0000 mL | Freq: Two times a day (BID) | ORAL | Status: DC
  Administered 2016-02-23: 237 mL via ORAL
  Filled 2016-02-23 (×2): qty 237

## 2016-02-23 NOTE — Progress Notes (Addendum)
Occupational Therapy Treatment Patient Details Name: [name] Marks MRN: [id number] DOB: 01-May-1955 Today's Date: 02/23/2016    History of present illness [name] Marks is a 60 y.o. female presenting with AMS . PMH is significant for ESRD on HD, complex atypical endometrial hyperplasia   OT comments  Pt progressing towards acute OT goals. Pt eager to complete OOB ADLs, fatigue noted during session. Rest break utilitized walking to/from bathroom. Session details below. D/c plan remains appropriate and pt verbalizing agreement with ST SNF for rehab prior to home.    Follow Up Recommendations  SNF    Equipment Recommendations  Other (comment) (TBD at next venue of care))    Recommendations for Other Services      Precautions / Restrictions Precautions Precautions: Fall Restrictions Weight Bearing Restrictions: No       Mobility Bed Mobility Overal bed mobility: Needs Assistance Bed Mobility: Supine to Sit     Supine to sit: Mod assist     General bed mobility comments: assist to powerup to EOB, discussed logroll technique for bed mobility.   Transfers Overall transfer level: Needs assistance Equipment used: Rolling walker (2 wheeled) Transfers: Sit to/from Stand Sit to Stand: Min assist;Mod assist         General transfer comment: cues for hand placement, momentum utilized during initiation of powerup with varying success. Did improve with subsequent sit<>stands, though fatigue noted at end of session.     Balance Overall balance assessment: Needs assistance Sitting-balance support: Bilateral upper extremity supported;Feet supported Sitting balance-Leahy Scale: Good Sitting balance - Comments: sat in supported seating to complete grooming and UB bathing tasks. External support utilized at times due to fatigue   Standing balance support: Bilateral upper extremity supported;During functional activity Standing balance-Leahy Scale: Fair                      ADL Overall ADL's : Needs assistance/impaired     Grooming: Wash/dry face;Set up;Sitting   Upper Body Bathing: Supervision/ safety;Set up;Sitting Upper Body Bathing Details (indicate cue type and reason): washed armpits and face, sitting         Lower Body Dressing: Moderate assistance;Sit to/from stand   Toilet Transfer: Moderate assistance;RW;Grab bars;Comfort height toilet   Toileting- Clothing Manipulation and Hygiene: Moderate assistance       Functional mobility during ADLs: Moderate assistance;Cueing for safety;Rolling walker General ADL Comments: Pt completed bed mobility, toilet transfer, pericare, LB dressing, UB bathing, and grooming tasks at sink as detailed above. Rest breaks incorporated.       Vision                     Perception     Praxis      Cognition   Behavior During Therapy: WFL for tasks assessed/performed Overall Cognitive Status: Within Functional Limits for tasks assessed                       Extremity/Trunk Assessment               Exercises     Shoulder Instructions       General Comments      Pertinent Vitals/ Pain       Pain Assessment: Faces Faces Pain Scale: Hurts little more Pain Location: RUE, neck, and abdomen Pain Descriptors / Indicators: Aching;Tightness Pain Intervention(s): Limited activity within patient's tolerance;Monitored during session;Repositioned  Home Living  Prior Functioning/Environment              Frequency  Min 2X/week        Progress Toward Goals  OT Goals(current goals can now be found in the care plan section)  Progress towards OT goals: Progressing toward goals  Acute Rehab OT Goals Patient Stated Goal: rehab then home OT Goal Formulation: With patient/family Time For Goal Achievement: 02/23/16 Potential to Achieve Goals: Good ADL Goals Pt Will Perform Grooming: with min guard  assist;standing Pt Will Perform Lower Body Bathing: with min assist;sitting/lateral leans;sit to/from stand Pt Will Perform Lower Body Dressing: with mod assist;sitting/lateral leans;sit to/from stand Pt Will Transfer to Toilet: with min assist;with min guard assist;ambulating;regular height toilet;bedside commode;grab bars Pt Will Perform Toileting - Clothing Manipulation and hygiene: with min assist;with min guard assist;sit to/from stand  Plan Discharge plan remains appropriate    Co-evaluation                 End of Session Equipment Utilized During Treatment: Gait belt;Rolling walker;Other (comment) (chair for rest breaks walking in the room)   Activity Tolerance Patient limited by fatigue   Patient Left in chair;with call bell/phone within reach;with chair alarm set   Nurse Communication Mobility status;Other (comment) (needed rest breaks in the room walking)        Time: 9476-5465 OT Time Calculation (min): 39 min  Charges: OT General Charges $OT Visit: 1 Procedure OT Treatments $Self Care/Home Management : 38-52 mins  Hortencia Pilar 02/23/2016, 12:02 PM

## 2016-02-23 NOTE — Progress Notes (Signed)
Transitions of Care Pharmacy Note  Plan:  Educated on cefazolin and rifampin length of therapy, new pantoprazole BID, and midodrine increase to TID.   --------------------------------------------- [name] Marks is an 60 y.o. female who presents with AMS. In anticipation of discharge, pharmacy has reviewed this patient's prior to admission medication history, as well as current inpatient medications listed per the [facility].  Current medication indications, dosing, frequency, and notable side effects reviewed with patient and family. patient verbalized understanding of current inpatient medication regimen and is aware that the After Visit Summary when presented, will represent the most accurate medication list at discharge.   Assessment: Understanding of regimen: fair Understanding of indications: fair Potential of compliance: fair Barriers to Obtaining Medications: No  Patient instructed to contact inpatient pharmacy team with further questions or concerns if needed.    Time spent preparing for discharge counseling: 20 mins Time spent counseling patient: 10 mins   Thank you for allowing pharmacy to be a part of this patient's care.  Gwenlyn Perking, PharmD PGY1 Pharmacy Resident Pager: [phone] 02/23/2016 6:43 PM

## 2016-02-23 NOTE — Progress Notes (Signed)
   VASCULAR SURGERY ASSESSMENT & PLAN:  POD 5 s/p removal of infected grafts right arm  Incisions look fine.   I will remove the sutures in the office.   Can evaluate for a left thigh AVG in the future after she has completed her 6 weeks of ABx. (will need ABI's preop)  SUBJECTIVE: No complaints  PHYSICAL EXAM: Vitals:   02/22/16 1500 02/22/16 1700 02/22/16 2101 02/23/16 0452  BP: (!) 108/23 (!) 114/50 (!) 114/29 (!) 110/32  Pulse: 73 76 72 79  Resp: (!) 26  20 19   Temp:  98.2 F (36.8 C) 98.9 F (37.2 C) 98.8 F (37.1 C)  TempSrc:  Oral Oral Oral  SpO2: 97% 98% 100% 97%  Weight:   216 lb 7.9 oz (98.2 kg)   Height:       Incisions right arm all look fine.   LABS: Lab Results  Component Value Date   WBC 13.5 (H) 02/23/2016   HGB 8.2 (L) 02/23/2016   HCT 25.5 (L) 02/23/2016   MCV 83.6 02/23/2016   PLT 383 02/23/2016   Lab Results  Component Value Date   CREATININE 4.16 (H) 02/23/2016   Lab Results  Component Value Date   INR 1.16 02/15/2016    Principal Problem:   Infection of AV graft for dialysis [facility]) Active Problems:   Altered mental status   Anemia   Bacteremia   Septic shock (HCC)   Encephalopathy   S/P dialysis catheter insertion (HCC)   Bilateral low back pain with sciatica   Neck pain   Endocarditis of native valve   End stage renal disease on dialysis ([place])   Staphylococcus aureus bacteremia   Severe aortic insufficiency   Bacteremia due to coagulase-negative Staphylococcus   Gae Gallop Beeper: [phone] 02/23/2016

## 2016-02-23 NOTE — Progress Notes (Signed)
Family Medicine Teaching Service Daily Progress Note Intern Pager: [phone]  Patient name: [name] Marks Medical record number: [id number] Date of birth: [birth year] Age: 60 y.o. Gender: female  Primary Care Provider: Lockie Pares, MD Consultants: Nephrology, ID, vascular, CVTS Code Status: Full   Pt Overview and Major Events to Date:  12/22: removal of infected R arm graft with bovine pericardial patch angioplasty of R brachial artery, and removal of R forearm graft. 12/24 placement of tunneled catheter 12/27 TEE with vegetation on aortic valve, severe AI   Assessment and Plan: [name] Marks is a 60 y.o. female presenting with AMS . PMH is significant for ESRD on HD, complex atypical endometrial hyperplasia   Bacteremia from AV graft infection s/p infected graft removal- Patient with 2/2 blood cultures positive for Coagulase negative staphylococcus from AV graft infection. She is s/p removal on 12/22. 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  12/22 (after graft removal) Bcx also with STAPHYLOCOCCUS LUGDUNENSIS. Concern for endocarditis, TTE with thickened aortic valve, can't rule out vegetation.  WBC 16.2 >>13.1. Afebrile.  Back pain is unlikely to be sign of deep infection given MRI of L spine neg for osteomyelitis or epidural abscess.  - Ancef per pharmacy, rixamin per ID for 6 week course of IV ABX  - Would culture with moderate Staph lugdenensis, sensitive to penicillins -  12/23 BC NG x 4 days, 12/25 BCx currently NG x2 day - TEE scheduled for 12/27 - wound care to follow up  - Incentive spirometry to improve air movement post-operatively.  Vegetation on Aortic Valve, severe AI: cardiology recommended CVTS evaluation given vegetation.  - consulted CVTS today, appreciate recommendations  Deconditioning- likely from acute illness/ decreased mobility during hospitalization - PT/ OT reordered, appreciate recs  - Continue to mobilize patient as tolerated.  ESRD on MWF schedule.  graft removed 12/22. Tunneled HD catheter placed (12/24). Midodrine increased to TID 12/26.  - follow renal function with dialysis  - nephrology following - monitor RFP   Anemia - Stable, Hgb drop likely due to fistula bleeding and has no more bleeding from site post op. Hgb 8.2>8.1>8.6. Baseline Hgb 11. Has had 4units pRBCs total this admission, last 12/22.   - monitor CBC  AMS, resolved. Likely 2/2 to bacteremia from AV graft infection, resolved. On admit head CT/MRI without acute findings   Complex Atypical Endometrial Hyperplasia - hold Megace per nephro  FEN/ GI: Renal diet, SLIV DVT prx: heparin sub-q   Disposition: SNF per PT recs   Subjective:  Patient feels well today. Discussed vegetation, patient understands that CVTS will eval and offer options today.   Objective: Temp:  [98.2 F (36.8 C)-98.9 F (37.2 C)] 98.8 F (37.1 C) (12/28 0452) Pulse Rate:  [67-97] 79 (12/28 0452) Resp:  [16-27] 19 (12/28 0452) BP: (94-135)/(14-93) 110/32 (12/28 0452) SpO2:  [97 %-100 %] 97 % (12/28 0452) Weight:  [214 lb 15.2 oz (97.5 kg)-216 lb 7.9 oz (98.2 kg)] 216 lb 7.9 oz (98.2 kg) (12/27 2101) Physical Exam: General: resting in bed getting HD Cardiovascular: RRR, grade 2/6 systolic murmur. No pitting edema. No JVD.  Respiratory: CTAB, normal WOB. Abdomen: BS+, soft, non-distended, non-tender.  Extremities: Right upper extremity has small suture line intact, no erythema or drainage. RUE swollen, mild per patient Neuro: AOx3, speech normal, follows commands, no focal deficits Psych: mood stable  Laboratory:  Recent Labs Lab 02/21/16 0744 02/22/16 0553 02/23/16 0537  WBC 13.1* 14.7* 13.5*  HGB 8.6* 8.6* 8.2*  HCT 26.9* 26.4* 25.5*  PLT 310 367 383    Recent Labs Lab 02/22/16 0553 02/22/16 0806 02/23/16 0537  NA 131* 133* 132*  K 4.7 4.2 3.3*  CL 95* 95* 97*  CO2 27 29 29   BUN 27* 26* 11  CREATININE 7.06* 7.08* 4.16*  CALCIUM 9.5 9.6 8.8*  GLUCOSE 79 92 86     Imaging/Diagnostic Tests: No results found.  Sela Hilding, MD 02/23/2016, 8:05 AM PGY-1, [place] Intern pager: [phone], text pages welcome

## 2016-02-23 NOTE — Progress Notes (Signed)
[facility] Progress Note   Subjective:  Seen in room. Feels ok. TEE showed aortic valve endocarditis with severe aortic insufficiency, CTS consult pending. No dyspnea or CP currently. No new symptoms. R arm is not bandaged today, incisions without signs of infection.  Objective Vitals:   02/22/16 1500 02/22/16 1700 02/22/16 2101 02/23/16 0452  BP: (!) 108/23 (!) 114/50 (!) 114/29 (!) 110/32  Pulse: 73 76 72 79  Resp: (!) 26  20 19   Temp:  98.2 F (36.8 C) 98.9 F (37.2 C) 98.8 F (37.1 C)  TempSrc:  Oral Oral Oral  SpO2: 97% 98% 100% 97%  Weight:   98.2 kg (216 lb 7.9 oz)   Height:       Physical Exam General: Well appearing female, NAD. Heart: RRR; 2/6 systolic murmur Lungs: CTAB Abdomen: soft, non-tender Extremities: No LE edema. RUE with intact sutures, no drainage. Dialysis Access: TDC in L chest; no tenderness/erythema.  Additional Objective Labs: Basic Metabolic Panel:  Recent Labs Lab 02/21/16 0744 02/22/16 0553 02/22/16 0806 02/23/16 0537  NA 132* 131* 133* 132*  K 4.1 4.7 4.2 3.3*  CL 97* 95* 95* 97*  CO2 25 27 29 29   GLUCOSE 91 79 92 86  BUN 18 27* 26* 11  CREATININE 5.87* 7.06* 7.08* 4.16*  CALCIUM 9.6 9.5 9.6 8.8*  PHOS 3.5  --  4.4 3.4   Liver Function Tests:  Recent Labs Lab 02/21/16 0744 02/22/16 0806 02/23/16 0537  ALBUMIN 2.2* 2.0* 1.9*   CBC:  Recent Labs Lab 02/20/16 0003 02/20/16 0529 02/21/16 0744 02/22/16 0553 02/23/16 0537  WBC 11.2* 12.3* 13.1* 14.7* 13.5*  HGB 8.2* 8.1* 8.6* 8.6* 8.2*  HCT 25.4* 25.0* 26.9* 26.4* 25.5*  MCV 83.3 84.2 83.3 82.5 83.6  PLT 246 244 310 367 383   Blood Culture    Component Value Date/Time   SDES BLOOD LEFT HAND 02/20/2016 0010   SPECREQUEST IN PEDIATRIC BOTTLE 3CC 02/20/2016 0010   CULT NO GROWTH 2 DAYS 02/20/2016 0010   REPTSTATUS PENDING 02/20/2016 0010   Medications:  . aspirin EC  81 mg Oral Daily  .  ceFAZolin (ANCEF) IV  2 g Intravenous Q M,W,F-1800  .  cinacalcet  60 mg Oral QPM  . darbepoetin (ARANESP) injection - DIALYSIS  100 mcg Intravenous Q Wed-HD  . heparin  5,000 Units Subcutaneous Q8H  . ipratropium  2 spray Each Nare QID  . midodrine  10 mg Oral TID WC  . multivitamin  1 tablet Oral QHS  . pantoprazole  40 mg Oral BID  . rifampin  300 mg Oral Q12H  . sevelamer carbonate  1,600 mg Oral TID WC  . sodium chloride  500 mL Intravenous Once    Dialysis Orders: MWF at [facility]  4h 99.5kg 2/2 bath RUE AVG (now removed)/ new L IJ cath Hep 6000 w 2500 midrun - Mircera 261mcg IV q 2 weeks (last given 12/13) - Calcitriol 0.18mcg PO q HD  Assessment/Plan: 1. Staph lugdenensis bacteremia w AVG infection: s/p R AVG resection 12/22, on IV Ancef with HD x 6 weeks. Has new TDC. ID following. 2. Aortic valve endocarditis (with severe AI): On TEE 12/27. CT surgery consult pending. 3. AMS: Resolved. Likely infection related. 4. ESRD: Continue MWF schedule, next due on 12/29. No heparin. 5. Hypotension: chronic on midodrine. Under dry wt (last post-HD wt 97.5), monitor. UF as tolerated. 6. Anemia of CKD: recent vaginal and AVG bleeding. Hgb 8.2, s/p 2U PRBCs 12/20, 1U 12/21, 1U  12/22. Tsat low, but holding off on IV iron in setting of bacteremia. Aranesp 115mcg resumed 12/27, increase to 261mcg weekly for 1/3. 7. Secondary hyperparathyroidism:Ca ok. Continue binders/VDRA/sensipar. 8. Nutrition: Known complex endometrial hyperplasia. Alb 1.9. Megace on hold, starting Nepro supplements.  Veneta Penton, PA-C 02/23/2016, 9:41 AM  [facility] Pager: [phone]  Pt seen, examined and agree w A/P as above.  [name] MD [employer] pager [phone]   02/23/2016, 1:39 PM

## 2016-02-24 ENCOUNTER — Inpatient Hospital Stay (HOSPITAL_COMMUNITY): Payer: Medicare Other

## 2016-02-24 ENCOUNTER — Other Ambulatory Visit: Payer: Self-pay | Admitting: *Deleted

## 2016-02-24 ENCOUNTER — Encounter (HOSPITAL_COMMUNITY)
Admission: EM | Disposition: A | Discharge status: Skilled Nursing Facility | Payer: Self-pay | Source: Home / Self Care | Attending: Family Medicine

## 2016-02-24 DIAGNOSIS — M25511 Pain in right shoulder: Secondary | ICD-10-CM

## 2016-02-24 DIAGNOSIS — Z01810 Encounter for preprocedural cardiovascular examination: Secondary | ICD-10-CM

## 2016-02-24 DIAGNOSIS — I339 Acute and subacute endocarditis, unspecified: Secondary | ICD-10-CM

## 2016-02-24 DIAGNOSIS — N186 End stage renal disease: Secondary | ICD-10-CM

## 2016-02-24 DIAGNOSIS — I351 Nonrheumatic aortic (valve) insufficiency: Secondary | ICD-10-CM

## 2016-02-24 DIAGNOSIS — Z0000 Encounter for general adult medical examination without abnormal findings: Secondary | ICD-10-CM

## 2016-02-24 DIAGNOSIS — R7301 Impaired fasting glucose: Secondary | ICD-10-CM

## 2016-02-24 DIAGNOSIS — I25119 Atherosclerotic heart disease of native coronary artery with unspecified angina pectoris: Secondary | ICD-10-CM

## 2016-02-24 LAB — POCT I-STAT 3, ART BLOOD GAS (G3+)
Acid-Base Excess: 3 mmol/L — ABNORMAL HIGH (ref 0.0–2.0)
Bicarbonate: 26.4 mmol/L (ref 20.0–28.0)
O2 Saturation: 98 %
PH ART: 7.475 — AB (ref 7.350–7.450)
TCO2: 27 mmol/L (ref 0–100)
pCO2 arterial: 35.8 mmHg (ref 32.0–48.0)
pO2, Arterial: 103 mmHg (ref 83.0–108.0)

## 2016-02-24 LAB — CBC
HCT: 27.3 % — ABNORMAL LOW (ref 36.0–46.0)
HEMOGLOBIN: 8.6 g/dL — AB (ref 12.0–15.0)
MCH: 27.4 pg (ref 26.0–34.0)
MCHC: 31.5 g/dL (ref 30.0–36.0)
MCV: 86.9 fL (ref 78.0–100.0)
PLATELETS: 420 10*3/uL — AB (ref 150–400)
RBC: 3.14 MIL/uL — ABNORMAL LOW (ref 3.87–5.11)
RDW: 19 % — ABNORMAL HIGH (ref 11.5–15.5)
WBC: 12.6 10*3/uL — ABNORMAL HIGH (ref 4.0–10.5)

## 2016-02-24 LAB — RENAL FUNCTION PANEL
Albumin: 2 g/dL — ABNORMAL LOW (ref 3.5–5.0)
Anion gap: 9 (ref 5–15)
BUN: 16 mg/dL (ref 6–20)
CALCIUM: 9.8 mg/dL (ref 8.9–10.3)
CO2: 28 mmol/L (ref 22–32)
CREATININE: 6.07 mg/dL — AB (ref 0.44–1.00)
Chloride: 96 mmol/L — ABNORMAL LOW (ref 101–111)
GFR calc Af Amer: 8 mL/min — ABNORMAL LOW (ref >60)
GFR calc non Af Amer: 7 mL/min — ABNORMAL LOW (ref >60)
GLUCOSE: 97 mg/dL (ref 65–99)
Phosphorus: 4.3 mg/dL (ref 2.5–4.6)
Potassium: 3.5 mmol/L (ref 3.5–5.1)
SODIUM: 133 mmol/L — AB (ref 135–145)

## 2016-02-24 LAB — PULMONARY FUNCTION TEST
FEF 25-75 POST: 0.68 L/s
FEF 25-75 Pre: 0.66 L/sec
FEF2575-%CHANGE-POST: 3 %
FEF2575-%PRED-POST: 37 %
FEF2575-%Pred-Pre: 36 %
FEV1-%CHANGE-POST: 1 %
FEV1-%Pred-Post: 52 %
FEV1-%Pred-Pre: 51 %
FEV1-POST: 0.93 L
FEV1-Pre: 0.92 L
FEV1FVC-%CHANGE-POST: 6 %
FEV1FVC-%PRED-PRE: 92 %
FEV6-%Change-Post: -4 %
FEV6-%Pred-Post: 54 %
FEV6-%Pred-Pre: 57 %
FEV6-Post: 1.2 L
FEV6-Pre: 1.25 L
FEV6FVC-%Pred-Post: 104 %
FEV6FVC-%Pred-Pre: 104 %
FVC-%CHANGE-POST: -4 %
FVC-%PRED-POST: 52 %
FVC-%PRED-PRE: 54 %
FVC-POST: 1.2 L
FVC-PRE: 1.25 L
POST FEV1/FVC RATIO: 78 %
PRE FEV1/FVC RATIO: 73 %
PRE FEV6/FVC RATIO: 100 %
Post FEV6/FVC ratio: 100 %

## 2016-02-24 LAB — POCT I-STAT 3, VENOUS BLOOD GAS (G3P V)
Acid-Base Excess: 3 mmol/L — ABNORMAL HIGH (ref 0.0–2.0)
BICARBONATE: 27.2 mmol/L (ref 20.0–28.0)
O2 Saturation: 73 %
PH VEN: 7.467 — AB (ref 7.250–7.430)
PO2 VEN: 36 mmHg (ref 32.0–45.0)
TCO2: 28 mmol/L (ref 0–100)
pCO2, Ven: 37.6 mmHg — ABNORMAL LOW (ref 44.0–60.0)

## 2016-02-24 LAB — PROTIME-INR
INR: 1.45
Prothrombin Time: 17.7 seconds — ABNORMAL HIGH (ref 11.4–15.2)

## 2016-02-24 LAB — POCT ACTIVATED CLOTTING TIME: ACTIVATED CLOTTING TIME: 131 s

## 2016-02-24 SURGERY — RIGHT HEART CATH AND CORONARY ANGIOGRAPHY

## 2016-02-24 MED ORDER — HEPARIN (PORCINE) IN NACL 2-0.9 UNIT/ML-% IJ SOLN
INTRAMUSCULAR | Status: AC
  Filled 2016-02-24: qty 1000

## 2016-02-24 MED ORDER — LIDOCAINE HCL (PF) 1 % IJ SOLN
INTRAMUSCULAR | Status: DC | PRN
  Administered 2016-02-24: 12 mL
  Administered 2016-02-24: 8 mL

## 2016-02-24 MED ORDER — IOPAMIDOL (ISOVUE-370) INJECTION 76%
INTRAVENOUS | Status: AC
  Filled 2016-02-24: qty 100

## 2016-02-24 MED ORDER — SODIUM CHLORIDE 0.9% FLUSH: 3.0000 mL | INTRAVENOUS | Status: DC | PRN

## 2016-02-24 MED ORDER — SODIUM CHLORIDE 0.9 % IV SOLN: 250.0000 mL | INTRAVENOUS | Status: DC | PRN

## 2016-02-24 MED ORDER — DEXMEDETOMIDINE HCL IN NACL 400 MCG/100ML IV SOLN
0.1000 ug/kg/h | INTRAVENOUS | Status: AC
Start: 2016-02-24 — End: 2016-02-25
  Administered 2016-02-25: 0.7 ug/kg/h via INTRAVENOUS
  Filled 2016-02-24: qty 100

## 2016-02-24 MED ORDER — MAGNESIUM SULFATE 50 % IJ SOLN
40.0000 meq | INTRAMUSCULAR | Status: DC
  Filled 2016-02-24: qty 10

## 2016-02-24 MED ORDER — TRANEXAMIC ACID 1000 MG/10ML IV SOLN
1.5000 mg/kg/h | INTRAVENOUS | Status: AC
  Administered 2016-02-25: 30 mg/kg/h via INTRAVENOUS
  Filled 2016-02-24: qty 25

## 2016-02-24 MED ORDER — TRANEXAMIC ACID (OHS) PUMP PRIME SOLUTION
2.0000 mg/kg | INTRAVENOUS | Status: DC
  Filled 2016-02-24: qty 1.96

## 2016-02-24 MED ORDER — FAMOTIDINE IN NACL 20-0.9 MG/50ML-% IV SOLN
20.0000 mg | INTRAVENOUS | Status: AC
  Administered 2016-02-24: 20 mg via INTRAVENOUS
  Filled 2016-02-24: qty 50

## 2016-02-24 MED ORDER — SODIUM CHLORIDE 0.9 % IV SOLN
INTRAVENOUS | Status: DC
  Filled 2016-02-24: qty 30

## 2016-02-24 MED ORDER — HEPARIN SODIUM (PORCINE) 1000 UNIT/ML IJ SOLN
INTRAMUSCULAR | Status: AC
  Filled 2016-02-24: qty 1

## 2016-02-24 MED ORDER — DEXTROSE 5 % IV SOLN
750.0000 mg | INTRAVENOUS | Status: AC
  Administered 2016-02-25: 750 mg via INTRAVENOUS
  Filled 2016-02-24: qty 750

## 2016-02-24 MED ORDER — NITROGLYCERIN IN D5W 200-5 MCG/ML-% IV SOLN
2.0000 ug/min | INTRAVENOUS | Status: AC
  Administered 2016-02-25: 20 ug/min via INTRAVENOUS
  Filled 2016-02-24: qty 250

## 2016-02-24 MED ORDER — DEXTROSE 5 % IV SOLN
1.5000 g | INTRAVENOUS | Status: AC
  Administered 2016-02-25: 1.5 g via INTRAVENOUS
  Filled 2016-02-24: qty 1.5

## 2016-02-24 MED ORDER — SODIUM CHLORIDE 0.9% FLUSH: 3.0000 mL | Freq: Two times a day (BID) | INTRAVENOUS | Status: DC

## 2016-02-24 MED ORDER — DIPHENHYDRAMINE HCL 50 MG/ML IJ SOLN
25.0000 mg | Freq: Once | INTRAMUSCULAR | Status: AC
  Administered 2016-02-24: 25 mg via INTRAVENOUS
  Filled 2016-02-24: qty 1

## 2016-02-24 MED ORDER — HEPARIN (PORCINE) IN NACL 2-0.9 UNIT/ML-% IJ SOLN
INTRAMUSCULAR | Status: DC | PRN
  Administered 2016-02-24: 1000 mL

## 2016-02-24 MED ORDER — LIDOCAINE HCL (PF) 1 % IJ SOLN
INTRAMUSCULAR | Status: AC
  Filled 2016-02-24: qty 30

## 2016-02-24 MED ORDER — PHENYLEPHRINE HCL 10 MG/ML IJ SOLN
30.0000 ug/min | INTRAVENOUS | Status: DC
  Filled 2016-02-24: qty 2

## 2016-02-24 MED ORDER — ALBUTEROL SULFATE (2.5 MG/3ML) 0.083% IN NEBU
2.5000 mg | INHALATION_SOLUTION | Freq: Once | RESPIRATORY_TRACT | Status: AC
  Administered 2016-02-24: 2.5 mg via RESPIRATORY_TRACT

## 2016-02-24 MED ORDER — DEXTROSE 5 % IV SOLN
0.0000 ug/min | INTRAVENOUS | Status: DC
  Filled 2016-02-24: qty 4

## 2016-02-24 MED ORDER — MIDODRINE HCL 5 MG PO TABS
ORAL_TABLET | ORAL | Status: AC
  Filled 2016-02-24: qty 2

## 2016-02-24 MED ORDER — POTASSIUM CHLORIDE 2 MEQ/ML IV SOLN
80.0000 meq | INTRAVENOUS | Status: DC
  Filled 2016-02-24: qty 40

## 2016-02-24 MED ORDER — VANCOMYCIN HCL 10 G IV SOLR
1250.0000 mg | INTRAVENOUS | Status: AC
  Administered 2016-02-25: 1250 mg via INTRAVENOUS
  Filled 2016-02-24: qty 1250

## 2016-02-24 MED ORDER — IOPAMIDOL (ISOVUE-370) INJECTION 76%
INTRAVENOUS | Status: DC | PRN
  Administered 2016-02-24: 70 mL via INTRA_ARTERIAL

## 2016-02-24 MED ORDER — PLASMA-LYTE 148 IV SOLN
INTRAVENOUS | Status: DC
  Filled 2016-02-24: qty 2.5

## 2016-02-24 MED ORDER — SODIUM CHLORIDE 0.9 % IV SOLN: INTRAVENOUS | Status: DC

## 2016-02-24 MED ORDER — MIDAZOLAM HCL 2 MG/2ML IJ SOLN
INTRAMUSCULAR | Status: DC | PRN
  Administered 2016-02-24 (×2): 1 mg via INTRAVENOUS
  Administered 2016-02-25: 2 mg

## 2016-02-24 MED ORDER — SODIUM CHLORIDE 0.9 % IV SOLN
INTRAVENOUS | Status: DC
  Filled 2016-02-24: qty 2.5

## 2016-02-24 MED ORDER — METHYLPREDNISOLONE SODIUM SUCC 125 MG IJ SOLR
125.0000 mg | Freq: Once | INTRAMUSCULAR | Status: AC
  Administered 2016-02-24: 125 mg via INTRAVENOUS
  Filled 2016-02-24: qty 2

## 2016-02-24 MED ORDER — VERAPAMIL HCL 2.5 MG/ML IV SOLN
INTRAVENOUS | Status: AC
  Filled 2016-02-24: qty 2

## 2016-02-24 MED ORDER — TRANEXAMIC ACID (OHS) BOLUS VIA INFUSION
15.0000 mg/kg | INTRAVENOUS | Status: DC
  Filled 2016-02-24: qty 1473

## 2016-02-24 MED ORDER — FENTANYL CITRATE (PF) 100 MCG/2ML IJ SOLN
INTRAMUSCULAR | Status: DC | PRN
  Administered 2016-02-24 (×2): 25 ug via INTRAVENOUS

## 2016-02-24 MED ORDER — FENTANYL CITRATE (PF) 100 MCG/2ML IJ SOLN
INTRAMUSCULAR | Status: AC
  Filled 2016-02-24: qty 2

## 2016-02-24 MED ORDER — MIDAZOLAM HCL 2 MG/2ML IJ SOLN
INTRAMUSCULAR | Status: AC
  Filled 2016-02-24: qty 2

## 2016-02-24 MED ORDER — DOPAMINE-DEXTROSE 3.2-5 MG/ML-% IV SOLN
0.0000 ug/kg/min | INTRAVENOUS | Status: DC
  Filled 2016-02-24: qty 250

## 2016-02-24 SURGICAL SUPPLY — 11 items
CATH INFINITI 5 FR 3DRC (CATHETERS) ×2 IMPLANT
CATH INFINITI 5FR JL4 (CATHETERS) ×2 IMPLANT
CATH INFINITI JR4 5F (CATHETERS) ×2 IMPLANT
CATH SWAN GANZ 7F STRAIGHT (CATHETERS) ×2 IMPLANT
HOVERMATT SINGLE USE (MISCELLANEOUS) ×4 IMPLANT
KIT HEART LEFT (KITS) ×2 IMPLANT
PACK CARDIAC CATHETERIZATION (CUSTOM PROCEDURE TRAY) ×2 IMPLANT
SHEATH PINNACLE 5F 10CM (SHEATH) ×2 IMPLANT
SHEATH PINNACLE 7F 10CM (SHEATH) ×2 IMPLANT
TRANSDUCER W/STOPCOCK (MISCELLANEOUS) ×2 IMPLANT
WIRE EMERALD 3MM-J .035X150CM (WIRE) ×2 IMPLANT

## 2016-02-24 NOTE — Progress Notes (Signed)
   VASCULAR SURGERY ASSESSMENT & PLAN:  Incisions in right arm are healing nicely. Sutures come out early next week.   Will check back Tuesday. If problems over the weekend, Dr. Trula Slade is on call.  SUBJECTIVE: No complaints.   PHYSICAL EXAM: Vitals:   02/24/16 1045 02/24/16 1052 02/24/16 1141 02/24/16 1146  BP: (!) 97/42 (!) 99/36 (!) 95/19 (!) 105/26  Pulse: 80 80 77   Resp:  18 16   Temp:  97.2 F (36.2 C) 98.4 F (36.9 C)   TempSrc:  Oral Oral   SpO2:   99% 100%  Weight:  216 lb 7.9 oz (98.2 kg)    Height:       Incisions in right arm are healing nicely.  LABS: Lab Results  Component Value Date   WBC 12.6 (H) 02/24/2016   HGB 8.6 (L) 02/24/2016   HCT 27.3 (L) 02/24/2016   MCV 86.9 02/24/2016   PLT 420 (H) 02/24/2016   Lab Results  Component Value Date   CREATININE 6.07 (H) 02/24/2016   Lab Results  Component Value Date   INR 1.45 02/24/2016    Principal Problem:   Infection of AV graft for dialysis [facility]) Active Problems:   Altered mental status   Anemia   Bacteremia   Septic shock (HCC)   Encephalopathy   S/P dialysis catheter insertion (HCC)   Bilateral low back pain with sciatica   Neck pain   Endocarditis of native valve   End stage renal disease on dialysis ([place])   Staphylococcus aureus bacteremia   Aortic valve insufficiency due to infection   Bacteremia due to coagulase-negative Staphylococcus   Aortic valve vegetation    Gae Gallop Beeper: [phone] 02/24/2016

## 2016-02-24 NOTE — Interval H&P Note (Signed)
History and Physical Interval Note:  02/24/2016 3:54 PM  [name] Marks  has presented today for cardiac cath with the diagnosis of severe aortic insufficiency, endocarditis, upcoming AVR.  The various methods of treatment have been discussed with the patient and family. After consideration of risks, benefits and other options for treatment, the patient has consented to  Procedure(s): Right/Left Heart Cath and Coronary Angiography (N/A) as a surgical intervention .  The patient's history has been reviewed, patient examined, no change in status, stable for surgery.  I have reviewed the patient's chart and labs.  Questions were answered to the patient's satisfaction.    NO PCI is planned. Pt is having AVR tomorrow.    Lauree Chandler

## 2016-02-24 NOTE — Progress Notes (Signed)
Received patient from [place], RN Cath Lab. Telemetry applied, called, and verified. Frequent vital established. Vitals WNL. No complaints of pain. Right groin site level 0 with sterile dressing. Placed diet order, patient calling for dinner now. Called [facility] for a further report. Will continue to monitor.  Cyndia Bent RN

## 2016-02-24 NOTE — Consult Note (Signed)
Admit date: 02/15/2016 Referring Physician  Dr. Erin Hearing Primary Physician  Dr. Lianne Moris, MD Primary Cardiologist  None Reason for Consultation  Endocarditis with severe AI  HPI: This is a 60 y.o. female with a history of ESRD on HD, GERD and gout who presented to the emergency room with altered mental status.   CT of head was normal.  WBC was elevated at 14.4.  During hospitalization she has some bleeding from her AV fistula and had had recent AVF infection and was treated with antibiotics but her daughter was unsure whether she completed her full course.  Prior to this admission she lived by herself and cared for herself without difficulty.  She denied any chest pain, SOB, fevers.  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  Now on antibx per ID. She underwent removal of infected grafts in right arm on  12/22.  She underwent TEE 12/27 showing aortic valve endocarditis with severe AI and widened pulse pressure with DBP < 4mmHg.  There was mild to moderate MR and TR and left pleural effusion with normal LVF EF 55-60%.  There were vegetations noted ont he right and non coronary cusps which prolapse past the valve plane into the LVOT with severe AI.  The vena contracta was the length of the LVOT with holodiastolic AI and mild AS.  Cardiology is now asked to consult along with CVTS surgery.  Currently in HD.   PMH:   Past Medical History:  Diagnosis Date  . Complication of anesthesia    due to kidney disease  . End stage renal disease on dialysis ([place])   . GERD (gastroesophageal reflux disease)   . Gout      PSH:   Past Surgical History:  Procedure Laterality Date  . [place] REMOVAL Right 02/17/2016   Procedure: REMOVAL OF TWO ARTERIOVENOUS GORETEX GRAFTS ([place]);  Surgeon: Angelia Mould, MD;  Location: [facility] OR;  Service: Vascular;  Laterality: Right;  . DG AV DIALYSIS GRAFT DECLOT OR    . DILATATION & CURETTAGE/HYSTEROSCOPY WITH TRUECLEAR N/A 11/06/2012   Procedure: DILATATION & CURETTAGE/HYSTEROSCOPY WITH TRUECLEAR;  Surgeon: Terrance Mass, MD;  Location: [place] [facility];  Service: Gynecology;  Laterality: N/A;  Truclear Resectoscopic Polypectomy   . INSERTION OF DIALYSIS CATHETER N/A 02/19/2016   Procedure: INSERTION OF Left Internal Jugular DIALYSIS CATHETER;  Surgeon: Angelia Mould, MD;  Location: [place];  Service: Vascular;  Laterality: N/A;  . PATCH ANGIOPLASTY Right 02/17/2016   Procedure: PATCH ANGIOPLASTY;  Surgeon: Angelia Mould, MD;  Location: [place];  Service: Vascular;  Laterality: Right;  . PERIPHERAL VASCULAR CATHETERIZATION N/A 09/14/2014   Procedure: A/V Shuntogram/Fistulagram;  Surgeon: Katha Cabal, MD;  Location: [place] [facility];  Service: Cardiovascular;  Laterality: N/A;  . PERIPHERAL VASCULAR CATHETERIZATION N/A 09/14/2014   Procedure: A/V Shunt Intervention;  Surgeon: Katha Cabal, MD;  Location: [place] [facility];  Service: Cardiovascular;  Laterality: N/A;  . PERIPHERAL VASCULAR CATHETERIZATION Right 12/09/2014   Procedure: A/V Shuntogram/Fistulagram;  Surgeon: Algernon Huxley, MD;  Location: [place] [facility];  Service: Cardiovascular;  Laterality: Right;  . PERIPHERAL VASCULAR CATHETERIZATION N/A 12/09/2014   Procedure: A/V Shunt Intervention;  Surgeon: Algernon Huxley, MD;  Location: [place] [facility];  Service: Cardiovascular;  Laterality: N/A;  . PERIPHERAL VASCULAR CATHETERIZATION Right 05/24/2015   Procedure: A/V Shuntogram;  Surgeon: Serafina Mitchell, MD;  Location: [place] [facility];  Service: Cardiovascular;  Laterality:  Right;  Marland Kitchen PERIPHERAL VASCULAR CATHETERIZATION Right 05/24/2015   Procedure: Peripheral Vascular Balloon Angioplasty;  Surgeon: Serafina Mitchell, MD;  Location: [place] [facility];  Service: Cardiovascular;  Laterality: Right;  right arm shunt  . PERIPHERAL VASCULAR CATHETERIZATION N/A 06/13/2015   Procedure: A/V Shuntogram/Fistulagram;  Surgeon: Algernon Huxley, MD;  Location: [place] [facility];  Service: Cardiovascular;  Laterality: N/A;  . PERIPHERAL VASCULAR CATHETERIZATION N/A 06/13/2015   Procedure: A/V Shunt Intervention;  Surgeon: Algernon Huxley, MD;  Location: [place] [facility];  Service: Cardiovascular;  Laterality: N/A;  . TEE WITHOUT CARDIOVERSION N/A 02/22/2016   Procedure: TRANSESOPHAGEAL ECHOCARDIOGRAM (TEE);  Surgeon: Pixie Casino, MD;  Location: [facility] END[facility]COPY;  Service: Cardiovascular;  Laterality: N/A;  . TUBAL LIGATION  1983    Allergies:  Contrast media [iodinated diagnostic agents] and Naproxen sodium Prior to Admit Meds:   Prescriptions Prior to Admission  Medication Sig Dispense Refill Last Dose  . aspirin EC 81 MG tablet Take 81 mg by mouth daily.   unknown  . cinacalcet (SENSIPAR) 60 MG tablet Take 60 mg by mouth at bedtime.    unknown  . famotidine (PEPCID) 20 MG tablet Take 20 mg by mouth daily.    unknown  . ibuprofen (ADVIL,MOTRIN) 600 MG tablet Take 600 mg by mouth every 6 (six) hours as needed (pain).   unknown  . megestrol (MEGACE) 40 MG tablet Take 1 tablet (40 mg total) by mouth 2 (two) times daily. 60 tablet 0 unknown  . midodrine (PROAMATINE) 10 MG tablet Take 10 mg by mouth 2 (two) times daily.    unknown  . naproxen (NAPROSYN) 500 MG tablet Take 500 mg by mouth 2 (two) times daily as needed for moderate pain.   unknown  . sevelamer (RENVELA) 800 MG tablet Take 1,600 mg by mouth 3 (three) times daily with meals.    unknown  . silver sulfADIAZINE (SILVADENE) 1 % cream Apply 1 application topically 2 (two) times daily as needed (wound).   unknown  . diclofenac sodium (VOLTAREN) 1 % GEL Apply 1 application topically 4 (four) times daily. Prn neck muscle pain. (Patient not taking: Reported on 02/17/2016) 100 g 0 Not Taking at Unknown time  . doxycycline (VIBRA-TABS) 100 MG tablet TAKE 1 TABLET BY MOUTH TWICE A DAY 30 tablet 0 unknown   Fam HX:    Family History  Problem Relation Age of Onset  . Diabetes Mother   . Hypertension  Mother   . Heart disease Mother   . Alcohol abuse Mother   . Kidney disease Mother   . Cancer Father     COLON  . Alcohol abuse Father   . Breast cancer Sister 29  . Hypertension Sister   . Kidney disease Sister   . Hypertension Son    Social HX:    Social History   Social History  . Marital status: Single    Spouse name: N/A  . Number of children: N/A  . Years of education: N/A   Occupational History  . Not on file.   Social History Main Topics  . Smoking status: Never Smoker  . Smokeless tobacco: Never Used  . Alcohol use No  . Drug use: No  . Sexual activity: Yes    Birth control/ protection: Post-menopausal   Other Topics Concern  . Not on file   Social History Narrative  . No narrative on file     ROS:  All 11 ROS were addressed and  are negative except what is stated in the HPI  Physical Exam: Blood pressure (!) 83/30, pulse 76, temperature 97 F (36.1 C), temperature source Oral, resp. rate 18, height 5\' 1"  (1.549 m), weight 215 lb 9.8 oz (97.8 kg), SpO2 100 %.    General: Well developed, well nourished, in no acute distress Head: Eyes PERRLA, No xanthomas.   Normal cephalic and atramatic  Lungs:   Clear bilaterally to auscultation and percussion. Heart:   HRRR S1 S2 Pulses are 2+ & equal. 2/6 SM at RUSB to LLSB            No carotid bruit. No JVD.  No abdominal bruits. No femoral bruits. Abdomen: Bowel sounds are positive, abdomen soft and non-tender without masses or                  Hernia's noted. Msk:  Back normal, normal gait. Normal strength and tone for age. Extremities:   No clubbing, cyanosis or edema.  DP +1 Neuro: Alert and oriented X 3. Psych:  Good affect, responds appropriately    Labs:   Lab Results  Component Value Date   WBC 12.6 (H) 02/24/2016   HGB 8.6 (L) 02/24/2016   HCT 27.3 (L) 02/24/2016   MCV 86.9 02/24/2016   PLT 420 (H) 02/24/2016    Recent Labs Lab 02/24/16 0653  NA 133*  K 3.5  CL 96*  CO2 28  BUN 16    CREATININE 6.07*  CALCIUM 9.8  GLUCOSE 97   No results found for: PTT Lab Results  Component Value Date   INR 1.16 02/15/2016   No results found for: CKTOTAL, CKMB, CKMBINDEX, TROPONINI   Lab Results  Component Value Date   CHOL 170 10/28/2014   Lab Results  Component Value Date   HDL 53 10/28/2014   Lab Results  Component Value Date   LDLCALC 96 10/28/2014   Lab Results  Component Value Date   TRIG 107 10/28/2014   Lab Results  Component Value Date   CHOLHDL 3.2 10/28/2014   No results found for: LDLDIRECT    Radiology:  No results found.  EKG:  NSR with no ST changes  ASSESSMENT/PLAN:   1.  Aortic valve endocarditis - TEE 12/27 showed aortic valve endocarditis with severe AI and widened pulse pressure with DBP < 2mmHg.  There was mild to moderate MR and TR and left pleural effusion with normal LVF EF 55-60%.  There were vegetations noted on tthe right and non coronary cusps which prolapse past the valve plane into the LVOT with severe AI.  The vena contracta was the length of the LVOT with holodiastolic AI and mild AS. 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  CVTS surgery has been consulted and plan for surgery in next 24 hours due to severity of AI and hypotension.   2.  Infected AVF s/p removal by Vascular  3.  S lugdenensis bacteremia - ID on board and guiding antibx therapy.  4.  ESRD on HD.  Fransico Him, MD  02/24/2016  8:55 AM

## 2016-02-24 NOTE — Progress Notes (Addendum)
Pre-op Cardiac Surgery  Carotid Findings:  Bilateral: No significant (1-39%) ICA stenosis. Antegrade vertebral flow.  Right subclavian >50% stenosis.  Upper Extremity Right Left  Brachial Pressures N/A restricted arm N/A IV location upper arm  Radial Waveforms Tri Tri  Ulnar Waveforms Tri Tri  Palmar Arch (Allen's Test) Normal  Decreases with radial and ulnar compression, but less than 50% decrease     Landry Mellow, RDMS, RVT 02/24/2016

## 2016-02-24 NOTE — Progress Notes (Addendum)
Site area: RFA/RFV Site Prior to Removal:  Level 0 Pressure Applied For: Manual: yes   Patient Status During Pull:stable   Post Pull Site:  Level 0 Post Pull Instructions Given:  yes Post Pull Pulses Present: palpable Dressing Applied:  tegaderm Bedrest begins @ 0600 till 2135 Comments:Pt to be transported to 2w05

## 2016-02-24 NOTE — Progress Notes (Addendum)
CARDIAC REHAB PHASE I   Pt just finished taking a bath, getting ready to go down for heart cath/vascular. Pt declines ambulation at this time. Cardiac surgery pre-op education completed with pt at bedside. Reviewed IS, sternal precautions, activity progression, cardiac surgery booklet and cardiac surgery guidelines. Left instructions to view cardiac surgery videos. Pt verbalized understanding. Pt in bed, call bell within reach. Will follow post-op.   [place], RN, BSN 02/24/2016 1:43 PM

## 2016-02-24 NOTE — Progress Notes (Signed)
[facility] Progress Note   Subjective:  On HD, no c/o, no SOB or CP  Objective Vitals:   02/24/16 1045 02/24/16 1052 02/24/16 1141 02/24/16 1146  BP: (!) 97/42 (!) 99/36 (!) 95/19 (!) 105/26  Pulse: 80 80 77   Resp:  18 16   Temp:  97.2 F (36.2 C) 98.4 F (36.9 C)   TempSrc:  Oral Oral   SpO2:   99% 100%  Weight:  98.2 kg (216 lb 7.9 oz)    Height:       Physical Exam General: Well appearing female, NAD. Heart: RRR; 2/6 systolic murmur Lungs: CTAB Abdomen: soft, non-tender Extremities: No LE edema. RUE with intact sutures, no drainage. Dialysis Access: TDC in L chest; no tenderness/erythema.  Dialysis Orders: MWF at [facility]  4h 99.5kg 2/2 bath RUE AVG (now removed)/ new L IJ cath Hep 6000 w 2500 midrun - Mircera 273mcg IV q 2 weeks (last given 12/13) - Calcitriol 0.70mcg PO q HD  Assessment: 1. Aortic valve endocarditis/ severe AI: On TEE 12/27. CT surgery consulted and planning surgery soon. Cardiology evaluating as well.  2. Staph lugdenensis bacteremia w AVG infection: s/p R AVG resection 12/22, on IV Ancef with HD x 6 weeks 3. ESRD: MWF HD 4. Hypotension: chronic on midodrine, euvolemic, close to dry wt 5. Anemia of CKD/ABL: recent vaginal/ AVG bleeding. Hgb 8's and stable. s/p 4U prbc's since admission. Tsat low, but holding off on IV iron in setting of bacteremia. Aranesp 166mcg resumed 12/27, increase to 233mcg weekly for 1/3. 6. Secondary hyperparathyroidism:Ca ok. Continue binders/VDRA/sensipar. 7. Nutrition: Known complex endometrial hyperplasia. Alb 1.9. Megace on hold, starting Nepro supplements.  Plan - HD today, no sig UF   [name] MD [place] pager [phone]   02/24/2016, 11:48 AM  Additional Objective Labs: Basic Metabolic Panel:  Recent Labs Lab 02/22/16 0806 02/23/16 0537 02/24/16 0653  NA 133* 132* 133*  K 4.2 3.3* 3.5  CL 95* 97* 96*  CO2 29 29 28   GLUCOSE 92 86 97  BUN 26* 11 16   CREATININE 7.08* 4.16* 6.07*  CALCIUM 9.6 8.8* 9.8  PHOS 4.4 3.4 4.3   Liver Function Tests:  Recent Labs Lab 02/22/16 0806 02/23/16 0537 02/24/16 0653  ALBUMIN 2.0* 1.9* 2.0*   CBC:  Recent Labs Lab 02/20/16 0529 02/21/16 0744 02/22/16 0553 02/23/16 0537 02/24/16 0654  WBC 12.3* 13.1* 14.7* 13.5* 12.6*  HGB 8.1* 8.6* 8.6* 8.2* 8.6*  HCT 25.0* 26.9* 26.4* 25.5* 27.3*  MCV 84.2 83.3 82.5 83.6 86.9  PLT 244 310 367 383 420*   Blood Culture    Component Value Date/Time   SDES BLOOD LEFT HAND 02/20/2016 0010   SPECREQUEST IN PEDIATRIC BOTTLE 3CC 02/20/2016 0010   CULT NO GROWTH 3 DAYS 02/20/2016 0010   REPTSTATUS PENDING 02/20/2016 0010   Medications:  . aspirin EC  81 mg Oral Daily  .  ceFAZolin (ANCEF) IV  2 g Intravenous Q M,W,F-1800  . cinacalcet  60 mg Oral QPM  . [START ON 02/29/2016] darbepoetin (ARANESP) injection - DIALYSIS  200 mcg Intravenous Q Wed-HD  . feeding supplement (NEPRO CARB STEADY)  237 mL Oral BID BM  . heparin  5,000 Units Subcutaneous Q8H  . ipratropium  2 spray Each Nare QID  . midodrine      . midodrine  10 mg Oral TID WC  . multivitamin  1 tablet Oral QHS  . pantoprazole  40 mg Oral BID  . rifampin  300 mg Oral Q12H  . sevelamer carbonate  1,600 mg Oral TID WC  . sodium chloride  500 mL Intravenous Once

## 2016-02-24 NOTE — Progress Notes (Signed)
Family Medicine Teaching Service Daily Progress Note Intern Pager: [phone]  Patient name: [name] Marks Medical record number: [id number] Date of birth: 1955-07-12 Age: 60 y.o. Gender: female  Primary Care Provider: Lockie Pares, MD Consultants: Nephrology, ID, vascular, CVTS Code Status: Full   Pt Overview and Major Events to Date:  12/22: removal of infected R arm graft with bovine pericardial patch angioplasty of R brachial artery, and removal of R forearm graft. 12/24 placement of tunneled catheter 12/27 TEE with vegetation on aortic valve, severe AI   Assessment and Plan: [name] Marks is a 60 y.o. female presenting with AMS . PMH is significant for ESRD on HD, complex atypical endometrial hyperplasia   Bacteremia from AV graft infection s/p infected graft removal- Patient with 2/2 blood cultures positive for Coagulase negative staphylococcus from AV graft infection. She is s/p removal on 12/22. 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  12/22 (after graft removal) Bcx also with STAPHYLOCOCCUS LUGDUNENSIS. Concern for endocarditis, TTE with thickened aortic valve, can't rule out vegetation.  WBC 16.2 >>13.1. Afebrile.  Back pain is unlikely to be sign of deep infection given MRI of L spine neg for osteomyelitis or epidural abscess. TEE with vegetation and severe aortic insufficiency.  - Ancef per pharmacy, rixamin added per ID for 6 week course of IV ABX  - Wound culture with moderate Staph lugdenensis, sensitive to penicillins -  12/23 BC NG x 4 days, 12/25 BCx currently NG x2 day - Incentive spirometry to improve air movement post-operatively.  Vegetation on Aortic Valve, severe AI:   - CVTS consulted, recommends cardiology to see.  -Plan for L+R heart cath by Cards, followed by likely aortic valve replacement this weekend by CVTS.  Deconditioning- likely from acute illness/ decreased mobility during hospitalization - PT/ OT - rec SNF and rolling walker  - Continue to  mobilize patient as tolerated.  ESRD on MWF schedule. Graft removed 12/22. Tunneled HD catheter placed (12/24). Midodrine increased to TID 12/26.  - follow renal function with dialysis  - nephrology following - monitor RFP   Anemia - Stable, Hgb drop likely due to fistula bleeding and has no more bleeding from site post op. Hgb 8.2>8.6. Baseline Hgb 11. Has had 4units pRBCs total this admission, last 12/22.   - monitor CBC  AMS, resolved. Likely 2/2 to bacteremia from AV graft infection, resolved. On admit head CT/MRI without acute findings.   Complex Atypical Endometrial Hyperplasia - hold Megace per nephro  FEN/ GI: Renal diet, SLIV DVT prx: heparin sub-q   Disposition: SNF per PT recs   Subjective:  Patient feels well today. Resting in HD this AM.  No concerns at this time.    Objective: Temp:  [97 F (36.1 C)-99.5 F (37.5 C)] 98.4 F (36.9 C) (12/29 1141) Pulse Rate:  [67-87] 77 (12/29 1141) Resp:  [16-20] 16 (12/29 1141) BP: (83-116)/(19-75) 105/26 (12/29 1146) SpO2:  [99 %-100 %] 100 % (12/29 1146) Weight:  [215 lb 9.8 oz (97.8 kg)-216 lb 7.9 oz (98.2 kg)] 216 lb 7.9 oz (98.2 kg) (12/29 1052)   Physical Exam: General: resting in bed getting HD this AM, in NAD Cardiovascular: RRR, grade 2/6 systolic murmur. No pitting edema. No JVD.  Respiratory: CTAB, normal WOB. Abdomen: BS+, soft, non-distended, non-tender.  Extremities: Right upper extremity has small suture line intact, no erythema or drainage. RUE appears swollen.  Neuro: AOx3, speech normal, follows commands, no focal deficits Psych: mood stable  Laboratory:  Recent Labs Lab 02/22/16 0553 02/23/16  1364 02/24/16 0654  WBC 14.7* 13.5* 12.6*  HGB 8.6* 8.2* 8.6*  HCT 26.4* 25.5* 27.3*  PLT 367 383 420*    Recent Labs Lab 02/22/16 0806 02/23/16 0537 02/24/16 0653  NA 133* 132* 133*  K 4.2 3.3* 3.5  CL 95* 97* 96*  CO2 29 29 28   BUN 26* 11 16  CREATININE 7.08* 4.16* 6.07*  CALCIUM 9.6 8.8*  9.8  GLUCOSE 92 86 97   Imaging/Diagnostic Tests: No results found.  Lovenia Kim, MD 02/24/2016, 1:57 PM PGY-1, [place] Intern pager: [phone], text pages welcome

## 2016-02-24 NOTE — Progress Notes (Signed)
    Met with pt and family to discuss plan for diagnostic catheterization today.  The patient understands that risks include but are not limited to stroke (1 in 1000), death (1 in 50), kidney failure [usually temporary] (1 in 500), bleeding (1 in 200), allergic reaction [possibly serious] (1 in 200), and agrees to proceed.    Murray Hodgkins, NP 02/24/2016 12:32 PM

## 2016-02-24 NOTE — H&P (View-Only) (Signed)
Admit date: 02/15/2016 Referring Physician  Dr. Erin Hearing Primary Physician  Dr. Lianne Moris, MD Primary Cardiologist  None Reason for Consultation  Endocarditis with severe AI  HPI: This is a 60 y.o. female with a history of ESRD on HD, GERD and gout who presented to the emergency room with altered mental status.   CT of head was normal.  WBC was elevated at 14.4.  During hospitalization she has some bleeding from her AV fistula and had had recent AVF infection and was treated with antibiotics but her daughter was unsure whether she completed her full course.  Prior to this admission she lived by herself and cared for herself without difficulty.  She denied any chest pain, SOB, fevers.  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  Now on antibx per ID. She underwent removal of infected grafts in right arm on  12/22.  She underwent TEE 12/27 showing aortic valve endocarditis with severe AI and widened pulse pressure with DBP < 24mmHg.  There was mild to moderate MR and TR and left pleural effusion with normal LVF EF 55-60%.  There were vegetations noted ont he right and non coronary cusps which prolapse past the valve plane into the LVOT with severe AI.  The vena contracta was the length of the LVOT with holodiastolic AI and mild AS.  Cardiology is now asked to consult along with CVTS surgery.  Currently in HD.   PMH:   Past Medical History:  Diagnosis Date  . Complication of anesthesia    due to kidney disease  . End stage renal disease on dialysis ([place])   . GERD (gastroesophageal reflux disease)   . Gout      PSH:   Past Surgical History:  Procedure Laterality Date  . [place] REMOVAL Right 02/17/2016   Procedure: REMOVAL OF TWO ARTERIOVENOUS GORETEX GRAFTS ([place]);  Surgeon: Angelia Mould, MD;  Location: [facility] OR;  Service: Vascular;  Laterality: Right;  . DG AV DIALYSIS GRAFT DECLOT OR    . DILATATION & CURETTAGE/HYSTEROSCOPY WITH TRUECLEAR N/A 11/06/2012   Procedure: DILATATION & CURETTAGE/HYSTEROSCOPY WITH TRUECLEAR;  Surgeon: Terrance Mass, MD;  Location: [place] [facility];  Service: Gynecology;  Laterality: N/A;  Truclear Resectoscopic Polypectomy   . INSERTION OF DIALYSIS CATHETER N/A 02/19/2016   Procedure: INSERTION OF Left Internal Jugular DIALYSIS CATHETER;  Surgeon: Angelia Mould, MD;  Location: [place];  Service: Vascular;  Laterality: N/A;  . PATCH ANGIOPLASTY Right 02/17/2016   Procedure: PATCH ANGIOPLASTY;  Surgeon: Angelia Mould, MD;  Location: [place];  Service: Vascular;  Laterality: Right;  . PERIPHERAL VASCULAR CATHETERIZATION N/A 09/14/2014   Procedure: A/V Shuntogram/Fistulagram;  Surgeon: Katha Cabal, MD;  Location: [place] [facility];  Service: Cardiovascular;  Laterality: N/A;  . PERIPHERAL VASCULAR CATHETERIZATION N/A 09/14/2014   Procedure: A/V Shunt Intervention;  Surgeon: Katha Cabal, MD;  Location: [place] [facility];  Service: Cardiovascular;  Laterality: N/A;  . PERIPHERAL VASCULAR CATHETERIZATION Right 12/09/2014   Procedure: A/V Shuntogram/Fistulagram;  Surgeon: Algernon Huxley, MD;  Location: [place] [facility];  Service: Cardiovascular;  Laterality: Right;  . PERIPHERAL VASCULAR CATHETERIZATION N/A 12/09/2014   Procedure: A/V Shunt Intervention;  Surgeon: Algernon Huxley, MD;  Location: [place] [facility];  Service: Cardiovascular;  Laterality: N/A;  . PERIPHERAL VASCULAR CATHETERIZATION Right 05/24/2015   Procedure: A/V Shuntogram;  Surgeon: Serafina Mitchell, MD;  Location: [place] [facility];  Service: Cardiovascular;  Laterality:  Right;  Marland Kitchen PERIPHERAL VASCULAR CATHETERIZATION Right 05/24/2015   Procedure: Peripheral Vascular Balloon Angioplasty;  Surgeon: Serafina Mitchell, MD;  Location: [place] [facility];  Service: Cardiovascular;  Laterality: Right;  right arm shunt  . PERIPHERAL VASCULAR CATHETERIZATION N/A 06/13/2015   Procedure: A/V Shuntogram/Fistulagram;  Surgeon: Algernon Huxley, MD;  Location: [place] [facility];  Service: Cardiovascular;  Laterality: N/A;  . PERIPHERAL VASCULAR CATHETERIZATION N/A 06/13/2015   Procedure: A/V Shunt Intervention;  Surgeon: Algernon Huxley, MD;  Location: [place] [facility];  Service: Cardiovascular;  Laterality: N/A;  . TEE WITHOUT CARDIOVERSION N/A 02/22/2016   Procedure: TRANSESOPHAGEAL ECHOCARDIOGRAM (TEE);  Surgeon: Pixie Casino, MD;  Location: [facility] ENDOSCOPY;  Service: Cardiovascular;  Laterality: N/A;  . TUBAL LIGATION  1983    Allergies:  Contrast media [iodinated diagnostic agents] and Naproxen sodium Prior to Admit Meds:   Prescriptions Prior to Admission  Medication Sig Dispense Refill Last Dose  . aspirin EC 81 MG tablet Take 81 mg by mouth daily.   unknown  . cinacalcet (SENSIPAR) 60 MG tablet Take 60 mg by mouth at bedtime.    unknown  . famotidine (PEPCID) 20 MG tablet Take 20 mg by mouth daily.    unknown  . ibuprofen (ADVIL,MOTRIN) 600 MG tablet Take 600 mg by mouth every 6 (six) hours as needed (pain).   unknown  . megestrol (MEGACE) 40 MG tablet Take 1 tablet (40 mg total) by mouth 2 (two) times daily. 60 tablet 0 unknown  . midodrine (PROAMATINE) 10 MG tablet Take 10 mg by mouth 2 (two) times daily.    unknown  . naproxen (NAPROSYN) 500 MG tablet Take 500 mg by mouth 2 (two) times daily as needed for moderate pain.   unknown  . sevelamer (RENVELA) 800 MG tablet Take 1,600 mg by mouth 3 (three) times daily with meals.    unknown  . silver sulfADIAZINE (SILVADENE) 1 % cream Apply 1 application topically 2 (two) times daily as needed (wound).   unknown  . diclofenac sodium (VOLTAREN) 1 % GEL Apply 1 application topically 4 (four) times daily. Prn neck muscle pain. (Patient not taking: Reported on 02/17/2016) 100 g 0 Not Taking at Unknown time  . doxycycline (VIBRA-TABS) 100 MG tablet TAKE 1 TABLET BY MOUTH TWICE A DAY 30 tablet 0 unknown   Fam HX:    Family History  Problem Relation Age of Onset  . Diabetes Mother   . Hypertension  Mother   . Heart disease Mother   . Alcohol abuse Mother   . Kidney disease Mother   . Cancer Father     COLON  . Alcohol abuse Father   . Breast cancer Sister 23  . Hypertension Sister   . Kidney disease Sister   . Hypertension Son    Social HX:    Social History   Social History  . Marital status: Single    Spouse name: N/A  . Number of children: N/A  . Years of education: N/A   Occupational History  . Not on file.   Social History Main Topics  . Smoking status: Never Smoker  . Smokeless tobacco: Never Used  . Alcohol use No  . Drug use: No  . Sexual activity: Yes    Birth control/ protection: Post-menopausal   Other Topics Concern  . Not on file   Social History Narrative  . No narrative on file     ROS:  All 11 ROS were addressed and  are negative except what is stated in the HPI  Physical Exam: Blood pressure (!) 83/30, pulse 76, temperature 97 F (36.1 C), temperature source Oral, resp. rate 18, height 5\' 1"  (1.549 m), weight 215 lb 9.8 oz (97.8 kg), SpO2 100 %.    General: Well developed, well nourished, in no acute distress Head: Eyes PERRLA, No xanthomas.   Normal cephalic and atramatic  Lungs:   Clear bilaterally to auscultation and percussion. Heart:   HRRR S1 S2 Pulses are 2+ & equal. 2/6 SM at RUSB to LLSB            No carotid bruit. No JVD.  No abdominal bruits. No femoral bruits. Abdomen: Bowel sounds are positive, abdomen soft and non-tender without masses or                  Hernia's noted. Msk:  Back normal, normal gait. Normal strength and tone for age. Extremities:   No clubbing, cyanosis or edema.  DP +1 Neuro: Alert and oriented X 3. Psych:  Good affect, responds appropriately    Labs:   Lab Results  Component Value Date   WBC 12.6 (H) 02/24/2016   HGB 8.6 (L) 02/24/2016   HCT 27.3 (L) 02/24/2016   MCV 86.9 02/24/2016   PLT 420 (H) 02/24/2016    Recent Labs Lab 02/24/16 0653  NA 133*  K 3.5  CL 96*  CO2 28  BUN 16    CREATININE 6.07*  CALCIUM 9.8  GLUCOSE 97   No results found for: PTT Lab Results  Component Value Date   INR 1.16 02/15/2016   No results found for: CKTOTAL, CKMB, CKMBINDEX, TROPONINI   Lab Results  Component Value Date   CHOL 170 10/28/2014   Lab Results  Component Value Date   HDL 53 10/28/2014   Lab Results  Component Value Date   LDLCALC 96 10/28/2014   Lab Results  Component Value Date   TRIG 107 10/28/2014   Lab Results  Component Value Date   CHOLHDL 3.2 10/28/2014   No results found for: LDLDIRECT    Radiology:  No results found.  EKG:  NSR with no ST changes  ASSESSMENT/PLAN:   1.  Aortic valve endocarditis - TEE 12/27 showed aortic valve endocarditis with severe AI and widened pulse pressure with DBP < 57mmHg.  There was mild to moderate MR and TR and left pleural effusion with normal LVF EF 55-60%.  There were vegetations noted on tthe right and non coronary cusps which prolapse past the valve plane into the LVOT with severe AI.  The vena contracta was the length of the LVOT with holodiastolic AI and mild AS. 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  CVTS surgery has been consulted and plan for surgery in next 24 hours due to severity of AI and hypotension.   2.  Infected AVF s/p removal by Vascular  3.  S lugdenensis bacteremia - ID on board and guiding antibx therapy.  4.  ESRD on HD.  Fransico Him, MD  02/24/2016  8:55 AM

## 2016-02-24 NOTE — Care Management Note (Signed)
Case Management Note  Patient Details  Name: [name] Marks MRN: [id number] Date of Birth: 1955-04-20  Subjective/Objective:   Severe aortic insufficiency, AVR planned on 02/25/2016                 Action/Plan: Discharge Planning: NCM spoke to pt and she lives alone. Her son is available to assist as needed. Pt will need RW at time of dc. Waiting final recommendations for home. Will continue to follow for dc needs.   PCP MIKELL, Jeani Sow   Expected Discharge Date:                  Expected Discharge Plan:  Home/Self Care  In-House Referral:  NA  Discharge planning Services  CM Consult  Post Acute Care Choice:  NA Choice offered to:  NA  DME Arranged:    DME Agency:     HH Arranged:    HH Agency:     Status of Service:  In process, will continue to follow  If discussed at Long Length of Stay Meetings, dates discussed:    Additional Comments:  Erenest Rasher, RN 02/24/2016, 6:56 PM

## 2016-02-24 NOTE — Progress Notes (Signed)
      [address]       [place] [address]             [phone]      S/p cath  Cath showed normal coronaries  I talked with her some more re: valve selection, bioprosthetic v mechanical. We discussed issues related to anticoagulation with mechanical valves.  After thinking about the options she prefers a bioprosthetic valve to avoid coumadin and risk of bleeding issues.  Revonda Standard Roxan Hockey, MD [facility] [phone]

## 2016-02-24 NOTE — Consult Note (Signed)
Reason for Consult:Aortic valve endocarditis Referring Physician: Dr. Schertz  [name] Marks is an 60 y.o. female.  HPI: Ms. Marks is a 60 yo woman with a history of ESRD (unknown primary etiology) on HD for many years. She also has a history of GERD and gout, but no diabetes or hypertension. She was admitted with altered mental status on 12/20. CT head was negative for stroke. She was found to an infected AV graft in the right arm. Blood cultures were positive for Staph lugdenensis. She is on Ancef. Her RUE graft was removed on 12/22 and she has a temporary HD catheter in the left subclavian. A TTE on 12/23 showed moderate AI and MR ans suggested a possible vegetation on the AV. She had a TEE on 12/27 which showed vegetations on the right and noncoronary cusps with prolapse of the leaflets and severe AI. EF was normal with an EF of 60%. She was also noted to have a widened pulse pressure for the first time. She was also noted to have a left pleural effusion.  She is HD currently. She denies chest pain or shortness of breath. She is in no distress.  Past Medical History:  Diagnosis Date  . Complication of anesthesia    due to kidney disease  . End stage renal disease on dialysis ([place])   . GERD (gastroesophageal reflux disease)   . Gout     Past Surgical History:  Procedure Laterality Date  . [place] REMOVAL Right 02/17/2016   Procedure: REMOVAL OF TWO ARTERIOVENOUS GORETEX GRAFTS ([place]);  Surgeon: Angelia Mould, MD;  Location: [facility] OR;  Service: Vascular;  Laterality: Right;  . DG AV DIALYSIS GRAFT DECLOT OR    . DILATATION & CURETTAGE/HYSTEROSCOPY WITH TRUECLEAR N/A 11/06/2012   Procedure: DILATATION & CURETTAGE/HYSTEROSCOPY WITH TRUECLEAR;  Surgeon: Terrance Mass, MD;  Location: [place] [facility];  Service: Gynecology;  Laterality: N/A;  Truclear Resectoscopic Polypectomy   . INSERTION OF DIALYSIS CATHETER N/A 02/19/2016   Procedure: INSERTION OF Left Internal Jugular DIALYSIS CATHETER;   Surgeon: Angelia Mould, MD;  Location: [place];  Service: Vascular;  Laterality: N/A;  . PATCH ANGIOPLASTY Right 02/17/2016   Procedure: PATCH ANGIOPLASTY;  Surgeon: Angelia Mould, MD;  Location: [place];  Service: Vascular;  Laterality: Right;  . PERIPHERAL VASCULAR CATHETERIZATION N/A 09/14/2014   Procedure: A/V Shuntogram/Fistulagram;  Surgeon: Katha Cabal, MD;  Location: [place] [facility];  Service: Cardiovascular;  Laterality: N/A;  . PERIPHERAL VASCULAR CATHETERIZATION N/A 09/14/2014   Procedure: A/V Shunt Intervention;  Surgeon: Katha Cabal, MD;  Location: [place] [facility];  Service: Cardiovascular;  Laterality: N/A;  . PERIPHERAL VASCULAR CATHETERIZATION Right 12/09/2014   Procedure: A/V Shuntogram/Fistulagram;  Surgeon: Algernon Huxley, MD;  Location: [place] [facility];  Service: Cardiovascular;  Laterality: Right;  . PERIPHERAL VASCULAR CATHETERIZATION N/A 12/09/2014   Procedure: A/V Shunt Intervention;  Surgeon: Algernon Huxley, MD;  Location: [place] [facility];  Service: Cardiovascular;  Laterality: N/A;  . PERIPHERAL VASCULAR CATHETERIZATION Right 05/24/2015   Procedure: A/V Shuntogram;  Surgeon: Serafina Mitchell, MD;  Location: [place] [facility];  Service: Cardiovascular;  Laterality: Right;  . PERIPHERAL VASCULAR CATHETERIZATION Right 05/24/2015   Procedure: Peripheral Vascular Balloon Angioplasty;  Surgeon: Serafina Mitchell, MD;  Location: [place] [facility];  Service: Cardiovascular;  Laterality: Right;  right arm shunt  . PERIPHERAL VASCULAR CATHETERIZATION N/A 06/13/2015   Procedure: A/V Shuntogram/Fistulagram;  Surgeon: Algernon Huxley, MD;  Location:  [place] [facility];  Service: Cardiovascular;  Laterality: N/A;  . PERIPHERAL VASCULAR CATHETERIZATION N/A 06/13/2015   Procedure: A/V Shunt Intervention;  Surgeon: Algernon Huxley, MD;  Location: [place] [facility];  Service: Cardiovascular;  Laterality: N/A;  . TEE WITHOUT CARDIOVERSION N/A 02/22/2016    Procedure: TRANSESOPHAGEAL ECHOCARDIOGRAM (TEE);  Surgeon: Pixie Casino, MD;  Location: [facility] ENDOSCOPY;  Service: Cardiovascular;  Laterality: N/A;  . TUBAL LIGATION  1983    Family History  Problem Relation Age of Onset  . Diabetes Mother   . Hypertension Mother   . Heart disease Mother   . Alcohol abuse Mother   . Kidney disease Mother   . Cancer Father     COLON  . Alcohol abuse Father   . Breast cancer Sister 54  . Hypertension Sister   . Kidney disease Sister   . Hypertension Son     Social History:  reports that she has never smoked. She has never used smokeless tobacco. She reports that she does not drink alcohol or use drugs.  Allergies:  Allergies  Allergen Reactions  . Contrast Media [Iodinated Diagnostic Agents] Anaphylaxis  . Naproxen Sodium Itching    Medications:  Scheduled: . aspirin EC  81 mg Oral Daily  .  ceFAZolin (ANCEF) IV  2 g Intravenous Q M,W,F-1800  . cinacalcet  60 mg Oral QPM  . [START ON 02/29/2016] darbepoetin (ARANESP) injection - DIALYSIS  200 mcg Intravenous Q Wed-HD  . feeding supplement (NEPRO CARB STEADY)  237 mL Oral BID BM  . heparin  5,000 Units Subcutaneous Q8H  . ipratropium  2 spray Each Nare QID  . midodrine      . midodrine  10 mg Oral TID WC  . multivitamin  1 tablet Oral QHS  . pantoprazole  40 mg Oral BID  . rifampin  300 mg Oral Q12H  . sevelamer carbonate  1,600 mg Oral TID WC  . sodium chloride  500 mL Intravenous Once    Results for orders placed or performed during the hospital encounter of 02/15/16 (from the past 48 hour(s))  Renal function panel     Status: Abnormal   Collection Time: 02/23/16  5:37 AM  Result Value Ref Range   Sodium 132 (L) 135 - 145 mmol/L   Potassium 3.3 (L) 3.5 - 5.1 mmol/L    Comment: DELTA CHECK NOTED NO VISIBLE HEMOLYSIS    Chloride 97 (L) 101 - 111 mmol/L   CO2 29 22 - 32 mmol/L   Glucose, Bld 86 65 - 99 mg/dL   BUN 11 6 - 20 mg/dL   Creatinine, Ser 4.16 (H) 0.44 - 1.00 mg/dL     Comment: DELTA CHECK NOTED   Calcium 8.8 (L) 8.9 - 10.3 mg/dL   Phosphorus 3.4 2.5 - 4.6 mg/dL   Albumin 1.9 (L) 3.5 - 5.0 g/dL   GFR calc non Af Amer 11 (L) >60 mL/min   GFR calc Af Amer 12 (L) >60 mL/min    Comment: (NOTE) The eGFR has been calculated using the CKD EPI equation. This calculation has not been validated in all clinical situations. eGFR's persistently <60 mL/min signify possible Chronic Kidney Disease.    Anion gap 6 5 - 15  CBC     Status: Abnormal   Collection Time: 02/23/16  5:37 AM  Result Value Ref Range   WBC 13.5 (H) 4.0 - 10.5 K/uL   RBC 3.05 (L) 3.87 - 5.11 MIL/uL   Hemoglobin 8.2 (L) 12.0 -  15.0 g/dL   HCT 25.5 (L) 36.0 - 46.0 %   MCV 83.6 78.0 - 100.0 fL   MCH 26.9 26.0 - 34.0 pg   MCHC 32.2 30.0 - 36.0 g/dL   RDW 18.8 (H) 11.5 - 15.5 %   Platelets 383 150 - 400 K/uL  Renal function panel     Status: Abnormal   Collection Time: 02/24/16  6:53 AM  Result Value Ref Range   Sodium 133 (L) 135 - 145 mmol/L   Potassium 3.5 3.5 - 5.1 mmol/L   Chloride 96 (L) 101 - 111 mmol/L   CO2 28 22 - 32 mmol/L   Glucose, Bld 97 65 - 99 mg/dL   BUN 16 6 - 20 mg/dL   Creatinine, Ser 6.07 (H) 0.44 - 1.00 mg/dL   Calcium 9.8 8.9 - 10.3 mg/dL   Phosphorus 4.3 2.5 - 4.6 mg/dL   Albumin 2.0 (L) 3.5 - 5.0 g/dL   GFR calc non Af Amer 7 (L) >60 mL/min   GFR calc Af Amer 8 (L) >60 mL/min    Comment: (NOTE) The eGFR has been calculated using the CKD EPI equation. This calculation has not been validated in all clinical situations. eGFR's persistently <60 mL/min signify possible Chronic Kidney Disease.    Anion gap 9 5 - 15  CBC     Status: Abnormal   Collection Time: 02/24/16  6:54 AM  Result Value Ref Range   WBC 12.6 (H) 4.0 - 10.5 K/uL   RBC 3.14 (L) 3.87 - 5.11 MIL/uL   Hemoglobin 8.6 (L) 12.0 - 15.0 g/dL   HCT 27.3 (L) 36.0 - 46.0 %   MCV 86.9 78.0 - 100.0 fL   MCH 27.4 26.0 - 34.0 pg   MCHC 31.5 30.0 - 36.0 g/dL   RDW 19.0 (H) 11.5 - 15.5 %   Platelets 420  (H) 150 - 400 K/uL    No results found.  Review of Systems  Constitutional: Positive for malaise/fatigue. Negative for chills and fever.  Respiratory: Negative for shortness of breath and wheezing.   Cardiovascular: Negative for chest pain, orthopnea, claudication and leg swelling.  Gastrointestinal: Positive for heartburn. Negative for blood in stool.  Genitourinary:       ESRD  Musculoskeletal: Positive for joint pain (history of gout).  Skin: Positive for itching and rash.  Neurological: Positive for loss of consciousness. Negative for focal weakness and seizures.       Presented with altered mental status  All other systems reviewed and are negative.  Blood pressure (!) 98/35, pulse 81, temperature 97 F (36.1 C), temperature source Oral, resp. rate 18, height _0  (1.549 m), weight 215 lb 9.8 oz (97.8 kg), SpO2 100 %. Physical Exam  Vitals reviewed. Constitutional: She is oriented to person, place, and time. She appears well-developed and well-nourished. No distress.  HENT:  Head: Normocephalic and atraumatic.  Eyes: Conjunctivae and EOM are normal. No scleral icterus.  Neck: No thyromegaly present.  No carotid bruits  Cardiovascular: Normal rate and regular rhythm.   Murmur (continuous systolic and diastolic murmur RUSB, diastolic audible at LLSB) heard. Pulses:      Femoral pulses are 3+ on the right side, and 3+ on the left side.      Dorsalis pedis pulses are 2+ on the right side, and 3+ on the left side.  Respiratory: Effort normal. No respiratory distress.  Diminished BS at bases L>R  GI: Soft. She exhibits no distension. There is no tenderness.  Musculoskeletal:  She exhibits no edema.  Neurological: She is alert and oriented to person, place, and time. No cranial nerve deficit. She exhibits normal muscle tone.  Skin: Skin is warm and dry.  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 She is afebrile on IV Ancef. A TEE on 12/27 showed severe AI with vegetations and marked prolapse of the right and noncoronary cusps. She is currently well compensated but has an extremely low diastolic pressure. In my opinion she needs aortic valve replacement for acute severe AI in the setting of endocarditis. Her most recent blood cultures from 12/25 are no growth at 3 days and her culture from 12/23 was negative, so her bacteremia is cleared. I think it is best to go ahead with AVR before she develops decompensated heart failure.   I discussed AVR with her. She has been on HD for many years and is relatively healthy with no DM. I think her life expectancy is better than the average HD patient her age presuming she gets through this acute illness, therefore, I would favor a mechanical valve in her case. I discussed mechanical v bioprosthetic valves with her and their relative advantages and disadvantages. She is in agreement with mechanical valve replacement understanding she will need lifelong anticoagulation.  I discussed the general nature of the procedure, the need for general anesthesia, the use of cardiopulmonary bypass, and the incisions to be used with Ms. Marks. we discussed the expected hospital stay, overall recovery and short and long term outcomes. She understands the risks include, but are not limited to death, stroke, MI, DVT/PE, bleeding, possible need for transfusion, infections, heart block, cardiac arrhythmias, and other organ system dysfunction including respiratory or GI complications.   She accepts the risks and agrees to proceed.  Will need cardiac catheterization prior to AVR  Melrose Nakayama 02/24/2016, 9:18 AM

## 2016-02-24 NOTE — Progress Notes (Signed)
Patient went to Cath Lab for Cardiac Cath. Patient was then transferred to 2W05 post procedure. Patients family Dorena Cookey, daughter and Darnelle Maffucci, son aware of the room switch.   Patients belongings carried over to 2W05.  Sheliah Plane RN

## 2016-02-24 NOTE — Progress Notes (Signed)
PT Cancellation Note  Patient Details Name: Crystal A [name] MRN: [id number] DOB: June 26, 1955   Cancelled Treatment:    Reason Eval/Treat Not Completed: Patient at procedure or test/unavailable.  Patient in HD this am.  This pm, patient having cardiac cath.  Will return at later time as appropriate for patient.   Despina Pole 02/24/2016, 5:08 PM Carita Pian. Sanjuana Kava, [place] Pager [phone]

## 2016-02-24 NOTE — Progress Notes (Signed)
Pharmacy Antibiotic Note  [name] Marks is a 60 y.o. female admitted on 02/15/2016 with bacteremia and AV fistula graft infection.   She is currently afebrile with wbc trending down slightly. Per ID, plan is to continue Cefazolin and Rifampin for at least 6 weeks. Repeat cultures from 12/23 and 12/25 were NGTD. Patient has been tolerating HD sessions well.    Plan: Continue Cefazolin 2g IV qHD  Monitor clinical status, HD tolerance, changes in HD schedule  Height: 5\' 1"  (154.9 cm) Weight: 216 lb 7.9 oz (98.2 kg) IBW/kg (Calculated) : 47.8  Temp (24hrs), Avg:98.3 F (36.8 C), Min:97 F (36.1 C), Max:99.5 F (37.5 C)   Recent Labs Lab 02/20/16 0529 02/21/16 0744 02/22/16 0553 02/22/16 0806 02/23/16 0537 02/24/16 0653 02/24/16 0654  WBC 12.3* 13.1* 14.7*  --  13.5*  --  12.6*  CREATININE  --  5.87* 7.06* 7.08* 4.16* 6.07*  --     Estimated Creatinine Clearance: 10.4 mL/min (by C-G formula based on SCr of 6.07 mg/dL (H)).    Allergies  Allergen Reactions  . Contrast Media [Iodinated Diagnostic Agents] Anaphylaxis  . Naproxen Sodium Itching   Antimicrobials this admission:  Vanc 12/21 >>12/22 Cefazolin 12/21>>(2/3)    *missed dose on 12/22 Rifampin 12/23>>  Dose adjustments this admission: 12/23- adjusted Ancef dose/schedule d/t missed dose and off-schedule HD  Microbiology results:  12/20 BCx: staph lugdenensis 12/21 MRSA PCR: negative  12/22 WCx: staph lugdunensis 12/22 BCx: staph lugdunensis 12/23 BCx: NGTD 12/25 BCx: NGTD  Thank you for allowing pharmacy to be a part of this patient's care.  Dierdre Harness, Cain Sieve, PharmD Clinical Pharmacy Resident [phone] (Pager) 02/24/2016 1:53 PM

## 2016-02-25 ENCOUNTER — Inpatient Hospital Stay (HOSPITAL_COMMUNITY): Payer: Medicare Other

## 2016-02-25 ENCOUNTER — Inpatient Hospital Stay (HOSPITAL_COMMUNITY): Payer: Medicare Other | Admitting: Anesthesiology

## 2016-02-25 ENCOUNTER — Encounter (HOSPITAL_COMMUNITY)
Admission: EM | Disposition: A | Discharge status: Skilled Nursing Facility | Payer: Self-pay | Source: Home / Self Care | Attending: Family Medicine

## 2016-02-25 DIAGNOSIS — Z952 Presence of prosthetic heart valve: Secondary | ICD-10-CM

## 2016-02-25 HISTORY — DX: Presence of prosthetic heart valve: Z95.2

## 2016-02-25 HISTORY — PX: TEE WITHOUT CARDIOVERSION: SHX5443

## 2016-02-25 HISTORY — PX: AORTIC VALVE REPLACEMENT: SHX41

## 2016-02-25 LAB — BASIC METABOLIC PANEL
Anion gap: 9 (ref 5–15)
BUN: 8 mg/dL (ref 6–20)
CALCIUM: 9.4 mg/dL (ref 8.9–10.3)
CHLORIDE: 98 mmol/L — AB (ref 101–111)
CO2: 26 mmol/L (ref 22–32)
CREATININE: 3.55 mg/dL — AB (ref 0.44–1.00)
GFR calc Af Amer: 15 mL/min — ABNORMAL LOW (ref >60)
GFR calc non Af Amer: 13 mL/min — ABNORMAL LOW (ref >60)
GLUCOSE: 97 mg/dL (ref 65–99)
Potassium: 4 mmol/L (ref 3.5–5.1)
Sodium: 133 mmol/L — ABNORMAL LOW (ref 135–145)

## 2016-02-25 LAB — POCT I-STAT, CHEM 8
BUN: 10 mg/dL (ref 6–20)
BUN: 11 mg/dL (ref 6–20)
BUN: 10 mg/dL (ref 6–20)
BUN: 11 mg/dL (ref 6–20)
BUN: 11 mg/dL (ref 6–20)
BUN: 12 mg/dL (ref 6–20)
CALCIUM ION: 1.37 mmol/L (ref 1.15–1.40)
CALCIUM ION: 1.24 mmol/L (ref 1.15–1.40)
CHLORIDE: 103 mmol/L (ref 101–111)
CREATININE: 3.6 mg/dL — AB (ref 0.44–1.00)
CREATININE: 3.8 mg/dL — AB (ref 0.44–1.00)
CREATININE: 3.6 mg/dL — AB (ref 0.44–1.00)
Calcium, Ion: 1.39 mmol/L (ref 1.15–1.40)
Calcium, Ion: 1.3 mmol/L (ref 1.15–1.40)
Calcium, Ion: 1.25 mmol/L (ref 1.15–1.40)
Calcium, Ion: 1.32 mmol/L (ref 1.15–1.40)
Chloride: 97 mmol/L — ABNORMAL LOW (ref 101–111)
Chloride: 98 mmol/L — ABNORMAL LOW (ref 101–111)
Chloride: 99 mmol/L — ABNORMAL LOW (ref 101–111)
Chloride: 101 mmol/L (ref 101–111)
Chloride: 101 mmol/L (ref 101–111)
Creatinine, Ser: 3.4 mg/dL — ABNORMAL HIGH (ref 0.44–1.00)
Creatinine, Ser: 3.9 mg/dL — ABNORMAL HIGH (ref 0.44–1.00)
Creatinine, Ser: 3.9 mg/dL — ABNORMAL HIGH (ref 0.44–1.00)
GLUCOSE: 134 mg/dL — AB (ref 65–99)
Glucose, Bld: 95 mg/dL (ref 65–99)
Glucose, Bld: 96 mg/dL (ref 65–99)
Glucose, Bld: 124 mg/dL — ABNORMAL HIGH (ref 65–99)
Glucose, Bld: 120 mg/dL — ABNORMAL HIGH (ref 65–99)
Glucose, Bld: 122 mg/dL — ABNORMAL HIGH (ref 65–99)
HCT: 22 % — ABNORMAL LOW (ref 36.0–46.0)
HCT: 24 % — ABNORMAL LOW (ref 36.0–46.0)
HCT: 24 % — ABNORMAL LOW (ref 36.0–46.0)
HCT: 28 % — ABNORMAL LOW (ref 36.0–46.0)
HEMATOCRIT: 27 % — AB (ref 36.0–46.0)
HEMATOCRIT: 24 % — AB (ref 36.0–46.0)
HEMOGLOBIN: 8.2 g/dL — AB (ref 12.0–15.0)
HEMOGLOBIN: 8.2 g/dL — AB (ref 12.0–15.0)
Hemoglobin: 9.2 g/dL — ABNORMAL LOW (ref 12.0–15.0)
Hemoglobin: 7.5 g/dL — ABNORMAL LOW (ref 12.0–15.0)
Hemoglobin: 8.2 g/dL — ABNORMAL LOW (ref 12.0–15.0)
Hemoglobin: 9.5 g/dL — ABNORMAL LOW (ref 12.0–15.0)
POTASSIUM: 4.2 mmol/L (ref 3.5–5.1)
POTASSIUM: 4.1 mmol/L (ref 3.5–5.1)
Potassium: 3.6 mmol/L (ref 3.5–5.1)
Potassium: 3.6 mmol/L (ref 3.5–5.1)
Potassium: 3.9 mmol/L (ref 3.5–5.1)
Potassium: 4.2 mmol/L (ref 3.5–5.1)
SODIUM: 134 mmol/L — AB (ref 135–145)
SODIUM: 136 mmol/L (ref 135–145)
SODIUM: 135 mmol/L (ref 135–145)
SODIUM: 136 mmol/L (ref 135–145)
Sodium: 134 mmol/L — ABNORMAL LOW (ref 135–145)
Sodium: 136 mmol/L (ref 135–145)
TCO2: 30 mmol/L (ref 0–100)
TCO2: 30 mmol/L (ref 0–100)
TCO2: 29 mmol/L (ref 0–100)
TCO2: 29 mmol/L (ref 0–100)
TCO2: 29 mmol/L (ref 0–100)
TCO2: 22 mmol/L (ref 0–100)

## 2016-02-25 LAB — POCT I-STAT 3, ART BLOOD GAS (G3+)
ACID-BASE DEFICIT: 2 mmol/L (ref 0.0–2.0)
ACID-BASE DEFICIT: 2 mmol/L (ref 0.0–2.0)
ACID-BASE DEFICIT: 3 mmol/L — AB (ref 0.0–2.0)
ACID-BASE EXCESS: 4 mmol/L — AB (ref 0.0–2.0)
ACID-BASE EXCESS: 3 mmol/L — AB (ref 0.0–2.0)
Acid-base deficit: 4 mmol/L — ABNORMAL HIGH (ref 0.0–2.0)
BICARBONATE: 28.8 mmol/L — AB (ref 20.0–28.0)
BICARBONATE: 21.1 mmol/L (ref 20.0–28.0)
Bicarbonate: 27.5 mmol/L (ref 20.0–28.0)
Bicarbonate: 23.4 mmol/L (ref 20.0–28.0)
Bicarbonate: 20.8 mmol/L (ref 20.0–28.0)
Bicarbonate: 22.9 mmol/L (ref 20.0–28.0)
O2 SAT: 100 %
O2 SAT: 100 %
O2 SAT: 100 %
O2 SAT: 98 %
O2 SAT: 97 %
O2 Saturation: 99 %
PCO2 ART: 37.4 mmHg (ref 32.0–48.0)
PH ART: 7.399 (ref 7.350–7.450)
PH ART: 7.407 (ref 7.350–7.450)
PH ART: 7.412 (ref 7.350–7.450)
PO2 ART: 401 mmHg — AB (ref 83.0–108.0)
PO2 ART: 129 mmHg — AB (ref 83.0–108.0)
Patient temperature: 36.2
TCO2: 30 mmol/L (ref 0–100)
TCO2: 29 mmol/L (ref 0–100)
TCO2: 25 mmol/L (ref 0–100)
TCO2: 22 mmol/L (ref 0–100)
TCO2: 24 mmol/L (ref 0–100)
TCO2: 22 mmol/L (ref 0–100)
pCO2 arterial: 41.3 mmHg (ref 32.0–48.0)
pCO2 arterial: 38.5 mmHg (ref 32.0–48.0)
pCO2 arterial: 32.8 mmHg (ref 32.0–48.0)
pCO2 arterial: 35.8 mmHg (ref 32.0–48.0)
pCO2 arterial: 31.7 mmHg — ABNORMAL LOW (ref 32.0–48.0)
pH, Arterial: 7.451 — ABNORMAL HIGH (ref 7.350–7.450)
pH, Arterial: 7.462 — ABNORMAL HIGH (ref 7.350–7.450)
pH, Arterial: 7.43 (ref 7.350–7.450)
pO2, Arterial: 428 mmHg — ABNORMAL HIGH (ref 83.0–108.0)
pO2, Arterial: 167 mmHg — ABNORMAL HIGH (ref 83.0–108.0)
pO2, Arterial: 106 mmHg (ref 83.0–108.0)
pO2, Arterial: 87 mmHg (ref 83.0–108.0)

## 2016-02-25 LAB — POCT I-STAT 4, (NA,K, GLUC, HGB,HCT)
Glucose, Bld: 156 mg/dL — ABNORMAL HIGH (ref 65–99)
HCT: 30 % — ABNORMAL LOW (ref 36.0–46.0)
Hemoglobin: 10.2 g/dL — ABNORMAL LOW (ref 12.0–15.0)
Potassium: 4 mmol/L (ref 3.5–5.1)
SODIUM: 134 mmol/L — AB (ref 135–145)

## 2016-02-25 LAB — GLUCOSE, CAPILLARY
GLUCOSE-CAPILLARY: 168 mg/dL — AB (ref 65–99)
GLUCOSE-CAPILLARY: 172 mg/dL — AB (ref 65–99)
GLUCOSE-CAPILLARY: 150 mg/dL — AB (ref 65–99)
GLUCOSE-CAPILLARY: 129 mg/dL — AB (ref 65–99)
GLUCOSE-CAPILLARY: 118 mg/dL — AB (ref 65–99)
GLUCOSE-CAPILLARY: 102 mg/dL — AB (ref 65–99)
GLUCOSE-CAPILLARY: 123 mg/dL — AB (ref 65–99)
GLUCOSE-CAPILLARY: 87 mg/dL (ref 65–99)
Glucose-Capillary: 174 mg/dL — ABNORMAL HIGH (ref 65–99)
Glucose-Capillary: 114 mg/dL — ABNORMAL HIGH (ref 65–99)

## 2016-02-25 LAB — PROTIME-INR
INR: 2.05
PROTHROMBIN TIME: 23.5 s — AB (ref 11.4–15.2)

## 2016-02-25 LAB — CBC
HCT: 27.9 % — ABNORMAL LOW (ref 36.0–46.0)
HEMATOCRIT: 26 % — AB (ref 36.0–46.0)
HEMATOCRIT: 27.2 % — AB (ref 36.0–46.0)
HEMOGLOBIN: 9 g/dL — AB (ref 12.0–15.0)
Hemoglobin: 8.3 g/dL — ABNORMAL LOW (ref 12.0–15.0)
Hemoglobin: 9.1 g/dL — ABNORMAL LOW (ref 12.0–15.0)
MCH: 27.2 pg (ref 26.0–34.0)
MCH: 28.3 pg (ref 26.0–34.0)
MCH: 27.8 pg (ref 26.0–34.0)
MCHC: 31.9 g/dL (ref 30.0–36.0)
MCHC: 33.1 g/dL (ref 30.0–36.0)
MCHC: 32.6 g/dL (ref 30.0–36.0)
MCV: 85.2 fL (ref 78.0–100.0)
MCV: 85.5 fL (ref 78.0–100.0)
MCV: 85.3 fL (ref 78.0–100.0)
PLATELETS: 304 10*3/uL (ref 150–400)
Platelets: 487 10*3/uL — ABNORMAL HIGH (ref 150–400)
Platelets: 257 10*3/uL (ref 150–400)
RBC: 3.05 MIL/uL — ABNORMAL LOW (ref 3.87–5.11)
RBC: 3.18 MIL/uL — AB (ref 3.87–5.11)
RBC: 3.27 MIL/uL — AB (ref 3.87–5.11)
RDW: 18.8 % — AB (ref 11.5–15.5)
RDW: 17.4 % — ABNORMAL HIGH (ref 11.5–15.5)
RDW: 17.7 % — AB (ref 11.5–15.5)
WBC: 13.3 10*3/uL — ABNORMAL HIGH (ref 4.0–10.5)
WBC: 27.1 10*3/uL — ABNORMAL HIGH (ref 4.0–10.5)
WBC: 22 10*3/uL — AB (ref 4.0–10.5)

## 2016-02-25 LAB — CULTURE, BLOOD (ROUTINE X 2)
Culture: NO GROWTH
Culture: NO GROWTH

## 2016-02-25 LAB — APTT: APTT: 42 s — AB (ref 24–36)

## 2016-02-25 LAB — CREATININE, SERUM
CREATININE: 4.02 mg/dL — AB (ref 0.44–1.00)
GFR, EST AFRICAN AMERICAN: 13 mL/min — AB (ref >60)
GFR, EST NON AFRICAN AMERICAN: 11 mL/min — AB (ref >60)

## 2016-02-25 LAB — PREPARE RBC (CROSSMATCH)

## 2016-02-25 LAB — MAGNESIUM: MAGNESIUM: 2 mg/dL (ref 1.7–2.4)

## 2016-02-25 LAB — HEMOGLOBIN AND HEMATOCRIT, BLOOD
HCT: 23 % — ABNORMAL LOW (ref 36.0–46.0)
HEMOGLOBIN: 7.6 g/dL — AB (ref 12.0–15.0)

## 2016-02-25 LAB — PLATELET COUNT: Platelets: 329 10*3/uL (ref 150–400)

## 2016-02-25 SURGERY — REPLACEMENT, AORTIC VALVE, OPEN
Anesthesia: General | Site: Chest

## 2016-02-25 MED ORDER — LACTATED RINGERS IV SOLN: INTRAVENOUS | Status: DC

## 2016-02-25 MED ORDER — METOPROLOL TARTRATE 5 MG/5ML IV SOLN
2.5000 mg | INTRAVENOUS | Status: DC | PRN
  Filled 2016-02-25: qty 5

## 2016-02-25 MED ORDER — SODIUM CHLORIDE 0.9 % IJ SOLN
OROMUCOSAL | Status: DC | PRN
  Administered 2016-02-25: 09:00:00 via TOPICAL

## 2016-02-25 MED ORDER — TRAMADOL HCL 50 MG PO TABS: 50.0000 mg | ORAL_TABLET | ORAL | Status: DC | PRN

## 2016-02-25 MED ORDER — ALBUMIN HUMAN 5 % IV SOLN
250.0000 mL | INTRAVENOUS | Status: AC | PRN
  Administered 2016-02-25 (×2): 250 mL via INTRAVENOUS

## 2016-02-25 MED ORDER — FENTANYL CITRATE (PF) 250 MCG/5ML IJ SOLN
INTRAMUSCULAR | Status: AC
  Filled 2016-02-25: qty 5

## 2016-02-25 MED ORDER — HEMOSTATIC AGENTS (NO CHARGE) OPTIME
TOPICAL | Status: DC | PRN
  Administered 2016-02-25: 1 via TOPICAL

## 2016-02-25 MED ORDER — MORPHINE SULFATE (PF) 2 MG/ML IV SOLN
1.0000 mg | INTRAVENOUS | Status: AC | PRN
  Administered 2016-02-25 (×2): 2 mg via INTRAVENOUS
  Filled 2016-02-25: qty 1

## 2016-02-25 MED ORDER — BISACODYL 5 MG PO TBEC
10.0000 mg | DELAYED_RELEASE_TABLET | Freq: Every day | ORAL | Status: DC
  Administered 2016-02-27 – 2016-02-29 (×3): 10 mg via ORAL
  Filled 2016-02-25 (×7): qty 2

## 2016-02-25 MED ORDER — DEXMEDETOMIDINE HCL IN NACL 200 MCG/50ML IV SOLN
0.0000 ug/kg/h | INTRAVENOUS | Status: DC
Start: 2016-02-25 — End: 2016-03-01

## 2016-02-25 MED ORDER — MIDAZOLAM HCL 2 MG/2ML IJ SOLN: 2.0000 mg | INTRAMUSCULAR | Status: DC | PRN

## 2016-02-25 MED ORDER — ASPIRIN 81 MG PO CHEW
324.0000 mg | CHEWABLE_TABLET | Freq: Every day | ORAL | Status: DC
Start: 2016-02-26 — End: 2016-03-06

## 2016-02-25 MED ORDER — ALBUMIN HUMAN 5 % IV SOLN
INTRAVENOUS | Status: DC | PRN
  Administered 2016-02-25: 12:00:00 via INTRAVENOUS

## 2016-02-25 MED ORDER — SODIUM CHLORIDE 0.9 % IV SOLN: 250.0000 mL | INTRAVENOUS | Status: DC

## 2016-02-25 MED ORDER — SODIUM CHLORIDE 0.9 % IV SOLN
INTRAVENOUS | Status: DC
  Administered 2016-02-25: 14:00:00 via INTRAVENOUS

## 2016-02-25 MED ORDER — PROTAMINE SULFATE 10 MG/ML IV SOLN
INTRAVENOUS | Status: DC | PRN
Start: 2016-02-25 — End: 2016-02-25
  Administered 2016-02-25 (×2): 150 mg via INTRAVENOUS

## 2016-02-25 MED ORDER — INSULIN REGULAR BOLUS VIA INFUSION
0.0000 [IU] | Freq: Three times a day (TID) | INTRAVENOUS | Status: DC
  Filled 2016-02-25: qty 10

## 2016-02-25 MED ORDER — VANCOMYCIN HCL IN DEXTROSE 750-5 MG/150ML-% IV SOLN
750.0000 mg | Freq: Once | INTRAVENOUS | Status: AC
  Administered 2016-02-25: 750 mg via INTRAVENOUS
  Filled 2016-02-25: qty 150

## 2016-02-25 MED ORDER — DOCUSATE SODIUM 100 MG PO CAPS
200.0000 mg | ORAL_CAPSULE | Freq: Every day | ORAL | Status: DC
  Administered 2016-02-27 – 2016-02-29 (×3): 200 mg via ORAL
  Filled 2016-02-25 (×4): qty 2

## 2016-02-25 MED ORDER — ACETAMINOPHEN 500 MG PO TABS
1000.0000 mg | ORAL_TABLET | Freq: Four times a day (QID) | ORAL | Status: AC
  Administered 2016-02-25 – 2016-03-01 (×13): 1000 mg via ORAL
  Filled 2016-02-25 (×13): qty 2

## 2016-02-25 MED ORDER — OXYCODONE HCL 5 MG PO TABS
5.0000 mg | ORAL_TABLET | ORAL | Status: DC | PRN
  Administered 2016-02-26 – 2016-03-06 (×9): 5 mg via ORAL
  Filled 2016-02-25 (×9): qty 1

## 2016-02-25 MED ORDER — POTASSIUM CHLORIDE 2 MEQ/ML IV SOLN
30.0000 meq | Freq: Once | INTRAVENOUS | Status: DC
  Filled 2016-02-25: qty 15

## 2016-02-25 MED ORDER — METOPROLOL TARTRATE 12.5 MG HALF TABLET: 12.5000 mg | ORAL_TABLET | Freq: Two times a day (BID) | ORAL | Status: DC

## 2016-02-25 MED ORDER — ACETAMINOPHEN 160 MG/5ML PO SOLN: 650.0000 mg | Freq: Once | ORAL | Status: AC

## 2016-02-25 MED ORDER — SODIUM CHLORIDE 0.9% FLUSH
3.0000 mL | Freq: Two times a day (BID) | INTRAVENOUS | Status: DC
  Administered 2016-02-26: 3 mL via INTRAVENOUS
  Administered 2016-02-26 – 2016-02-27 (×2): 10 mL via INTRAVENOUS
  Administered 2016-02-27 – 2016-02-29 (×4): 3 mL via INTRAVENOUS

## 2016-02-25 MED ORDER — CISATRACURIUM BESYLATE (PF) 10 MG/5ML IV SOLN
INTRAVENOUS | Status: DC | PRN
  Administered 2016-02-25: 12 mg via INTRAVENOUS
  Administered 2016-02-25: 8 mg via INTRAVENOUS

## 2016-02-25 MED ORDER — PANTOPRAZOLE SODIUM 40 MG PO TBEC
40.0000 mg | DELAYED_RELEASE_TABLET | Freq: Every day | ORAL | Status: DC
  Administered 2016-02-27 – 2016-03-01 (×4): 40 mg via ORAL
  Filled 2016-02-25 (×4): qty 1

## 2016-02-25 MED ORDER — PHENYLEPHRINE HCL 10 MG/ML IJ SOLN
INTRAMUSCULAR | Status: DC | PRN
  Administered 2016-02-25: 80 ug via INTRAVENOUS

## 2016-02-25 MED ORDER — PHENYLEPHRINE HCL 10 MG/ML IJ SOLN
0.0000 ug/min | INTRAVENOUS | Status: DC
  Filled 2016-02-25: qty 2

## 2016-02-25 MED ORDER — LIDOCAINE 2% (20 MG/ML) 5 ML SYRINGE
INTRAMUSCULAR | Status: AC
  Filled 2016-02-25: qty 5

## 2016-02-25 MED ORDER — MIDAZOLAM HCL 2 MG/2ML IJ SOLN
INTRAMUSCULAR | Status: AC
  Administered 2016-02-25: 2 mg
  Filled 2016-02-25: qty 2

## 2016-02-25 MED ORDER — MIDAZOLAM HCL 10 MG/2ML IJ SOLN
INTRAMUSCULAR | Status: AC
  Filled 2016-02-25: qty 2

## 2016-02-25 MED ORDER — PHENYLEPHRINE HCL 10 MG/ML IJ SOLN
INTRAVENOUS | Status: DC | PRN
  Administered 2016-02-25: 60 ug/min via INTRAVENOUS

## 2016-02-25 MED ORDER — NITROGLYCERIN IN D5W 200-5 MCG/ML-% IV SOLN: 0.0000 ug/min | INTRAVENOUS | Status: DC

## 2016-02-25 MED ORDER — ROCURONIUM BROMIDE 100 MG/10ML IV SOLN
INTRAVENOUS | Status: DC | PRN
  Administered 2016-02-25: 10 mg via INTRAVENOUS
  Administered 2016-02-25: 20 mg via INTRAVENOUS

## 2016-02-25 MED ORDER — ORAL CARE MOUTH RINSE
15.0000 mL | Freq: Four times a day (QID) | OROMUCOSAL | Status: DC
  Administered 2016-02-25 – 2016-02-26 (×5): 15 mL via OROMUCOSAL

## 2016-02-25 MED ORDER — SODIUM CHLORIDE 0.45 % IV SOLN
INTRAVENOUS | Status: DC | PRN
  Administered 2016-02-25: 13:00:00 via INTRAVENOUS

## 2016-02-25 MED ORDER — NOREPINEPHRINE BITARTRATE 1 MG/ML IV SOLN
0.0000 ug/min | INTRAVENOUS | Status: DC
  Administered 2016-02-25: 2 ug/min via INTRAVENOUS
  Filled 2016-02-25: qty 4

## 2016-02-25 MED ORDER — LIDOCAINE HCL (CARDIAC) 20 MG/ML IV SOLN
INTRAVENOUS | Status: DC | PRN
  Administered 2016-02-25: 80 mg via INTRATRACHEAL

## 2016-02-25 MED ORDER — SODIUM CHLORIDE 0.9 % IJ SOLN
INTRAMUSCULAR | Status: AC
  Filled 2016-02-25: qty 10

## 2016-02-25 MED ORDER — SODIUM CHLORIDE 0.9 % IV SOLN
INTRAVENOUS | Status: DC
  Filled 2016-02-25: qty 2.5

## 2016-02-25 MED ORDER — SODIUM CHLORIDE 0.9% FLUSH: 3.0000 mL | INTRAVENOUS | Status: DC | PRN

## 2016-02-25 MED ORDER — FENTANYL CITRATE (PF) 250 MCG/5ML IJ SOLN
INTRAMUSCULAR | Status: AC
  Filled 2016-02-25: qty 15

## 2016-02-25 MED ORDER — ACETAMINOPHEN 160 MG/5ML PO SOLN: 1000.0000 mg | Freq: Four times a day (QID) | ORAL | Status: AC

## 2016-02-25 MED ORDER — ASPIRIN EC 325 MG PO TBEC
325.0000 mg | DELAYED_RELEASE_TABLET | Freq: Every day | ORAL | Status: DC
  Administered 2016-02-27 – 2016-03-06 (×8): 325 mg via ORAL
  Filled 2016-02-25 (×9): qty 1

## 2016-02-25 MED ORDER — BISACODYL 10 MG RE SUPP: 10.0000 mg | Freq: Every day | RECTAL | Status: DC

## 2016-02-25 MED ORDER — MORPHINE SULFATE (PF) 2 MG/ML IV SOLN
2.0000 mg | INTRAVENOUS | Status: DC | PRN
  Administered 2016-02-25 – 2016-02-26 (×4): 2 mg via INTRAVENOUS
  Filled 2016-02-25 (×5): qty 1

## 2016-02-25 MED ORDER — HEPARIN SODIUM (PORCINE) 1000 UNIT/ML IJ SOLN
INTRAMUSCULAR | Status: AC
Start: 2016-02-25 — End: 2016-02-25
  Filled 2016-02-25: qty 1

## 2016-02-25 MED ORDER — METOPROLOL TARTRATE 25 MG/10 ML ORAL SUSPENSION: 12.5000 mg | Freq: Two times a day (BID) | ORAL | Status: DC

## 2016-02-25 MED ORDER — FAMOTIDINE IN NACL 20-0.9 MG/50ML-% IV SOLN
20.0000 mg | INTRAVENOUS | Status: AC
  Administered 2016-02-25: 20 mg via INTRAVENOUS

## 2016-02-25 MED ORDER — CHLORHEXIDINE GLUCONATE 0.12% ORAL RINSE (MEDLINE KIT)
15.0000 mL | Freq: Two times a day (BID) | OROMUCOSAL | Status: DC
  Administered 2016-02-25: 15 mL via OROMUCOSAL

## 2016-02-25 MED ORDER — ONDANSETRON HCL 4 MG/2ML IJ SOLN
4.0000 mg | Freq: Four times a day (QID) | INTRAMUSCULAR | Status: DC | PRN
  Administered 2016-03-03: 4 mg via INTRAVENOUS
  Filled 2016-02-25: qty 2

## 2016-02-25 MED ORDER — SODIUM CHLORIDE 0.9 % IV SOLN
INTRAVENOUS | Status: DC | PRN
  Administered 2016-02-25: 08:00:00 via INTRAVENOUS

## 2016-02-25 MED ORDER — VANCOMYCIN HCL IN DEXTROSE 1-5 GM/200ML-% IV SOLN
750.0000 mg | Freq: Once | INTRAVENOUS | Status: DC
  Filled 2016-02-25: qty 200

## 2016-02-25 MED ORDER — FENTANYL CITRATE (PF) 250 MCG/5ML IJ SOLN
INTRAMUSCULAR | Status: DC | PRN
  Administered 2016-02-25: 100 ug via INTRAVENOUS
  Administered 2016-02-25: 250 ug via INTRAVENOUS
  Administered 2016-02-25 (×2): 100 ug via INTRAVENOUS
  Administered 2016-02-25 (×2): 250 ug via INTRAVENOUS
  Administered 2016-02-25: 200 ug via INTRAVENOUS

## 2016-02-25 MED ORDER — NOREPINEPHRINE BITARTRATE 1 MG/ML IV SOLN
0.0000 ug/min | INTRAVENOUS | Status: DC
  Administered 2016-02-25: 12 ug/min via INTRAVENOUS
  Administered 2016-02-26: 8 ug/min via INTRAVENOUS
  Filled 2016-02-25 (×3): qty 16

## 2016-02-25 MED ORDER — HEPARIN SODIUM (PORCINE) 1000 UNIT/ML IJ SOLN
INTRAMUSCULAR | Status: DC | PRN
  Administered 2016-02-25: 30000 [IU] via INTRAVENOUS

## 2016-02-25 MED ORDER — LACTATED RINGERS IV SOLN: 500.0000 mL | Freq: Once | INTRAVENOUS | Status: DC | PRN

## 2016-02-25 MED ORDER — SODIUM CHLORIDE 0.9 % IV SOLN
INTRAVENOUS | Status: DC | PRN
  Administered 2016-02-25: .7 [IU]/h via INTRAVENOUS

## 2016-02-25 MED ORDER — PROPOFOL 10 MG/ML IV BOLUS
INTRAVENOUS | Status: AC
  Filled 2016-02-25: qty 20

## 2016-02-25 MED ORDER — SODIUM CHLORIDE 0.9 % IV SOLN
30.0000 ug | Freq: Once | INTRAVENOUS | Status: AC
  Administered 2016-02-25: 30 ug via INTRAVENOUS
  Filled 2016-02-25 (×2): qty 7.5

## 2016-02-25 MED ORDER — PROTAMINE SULFATE 10 MG/ML IV SOLN
INTRAVENOUS | Status: AC
  Filled 2016-02-25: qty 25

## 2016-02-25 MED ORDER — EPHEDRINE SULFATE 50 MG/ML IJ SOLN
INTRAMUSCULAR | Status: DC | PRN
  Administered 2016-02-25: 5 mg via INTRAVENOUS

## 2016-02-25 MED ORDER — CISATRACURIUM BESYLATE 20 MG/10ML IV SOLN
INTRAVENOUS | Status: AC
  Filled 2016-02-25: qty 10

## 2016-02-25 MED ORDER — ACETAMINOPHEN 650 MG RE SUPP
650.0000 mg | Freq: Once | RECTAL | Status: AC
  Administered 2016-02-25: 650 mg via RECTAL

## 2016-02-25 MED ORDER — MIDAZOLAM HCL 5 MG/ML IJ SOLN
INTRAMUSCULAR | Status: DC | PRN
  Administered 2016-02-25: 2 mg via INTRAVENOUS
  Administered 2016-02-25: 3 mg via INTRAVENOUS
  Administered 2016-02-25: 2 mg via INTRAVENOUS

## 2016-02-25 MED ORDER — 0.9 % SODIUM CHLORIDE (POUR BTL) OPTIME
TOPICAL | Status: DC | PRN
  Administered 2016-02-25: 6000 mL

## 2016-02-25 MED ORDER — ROCURONIUM BROMIDE 50 MG/5ML IV SOSY
PREFILLED_SYRINGE | INTRAVENOUS | Status: AC
  Filled 2016-02-25: qty 5

## 2016-02-25 MED ORDER — PROPOFOL 10 MG/ML IV BOLUS
INTRAVENOUS | Status: DC | PRN
  Administered 2016-02-25: 20 mg via INTRAVENOUS
  Administered 2016-02-25: 50 mg via INTRAVENOUS
  Administered 2016-02-25: 30 mg via INTRAVENOUS
  Administered 2016-02-25: 100 mg via INTRAVENOUS

## 2016-02-25 MED ORDER — CHLORHEXIDINE GLUCONATE 0.12 % MT SOLN
15.0000 mL | OROMUCOSAL | Status: AC
  Administered 2016-02-25: 15 mL via OROMUCOSAL

## 2016-02-25 MED ORDER — MAGNESIUM SULFATE 4 GM/100ML IV SOLN
4.0000 g | Freq: Once | INTRAVENOUS | Status: DC
  Filled 2016-02-25: qty 100

## 2016-02-25 SURGICAL SUPPLY — 80 items
ADAPTER CARDIO PERF ANTE/RETRO (ADAPTER) ×3 IMPLANT
APPLICATOR COTTON TIP 6IN STRL (MISCELLANEOUS) ×3 IMPLANT
BAG DECANTER FOR FLEXI CONT (MISCELLANEOUS) IMPLANT
BLADE STERNUM SYSTEM 6 (BLADE) ×3 IMPLANT
BLADE SURG 15 STRL LF DISP TIS (BLADE) ×2 IMPLANT
BLADE SURG 15 STRL SS (BLADE) ×1
CANISTER SUCTION 2500CC (MISCELLANEOUS) ×3 IMPLANT
CANNULA AORTIC ROOT 9FR (CANNULA) ×3 IMPLANT
CANNULA EZ GLIDE AORTIC 21FR (CANNULA) ×3 IMPLANT
CANNULA GUNDRY RCSP 15FR (MISCELLANEOUS) ×3 IMPLANT
CANNULA MC2 2 STG 36/46 NON-V (CANNULA) ×2 IMPLANT
CANNULA VENOUS 2 STG 34/46 (CANNULA) ×1
CATH CPB KIT HENDRICKSON (MISCELLANEOUS) IMPLANT
CATH HEART VENT LEFT (CATHETERS) ×2 IMPLANT
CATH ROBINSON RED A/P 18FR (CATHETERS) ×6 IMPLANT
CATH THORACIC 36FR RT ANG (CATHETERS) IMPLANT
CLIP FOGARTY SPRING 6M (CLIP) IMPLANT
CONT SPEC 4OZ CLIKSEAL STRL BL (MISCELLANEOUS) ×6 IMPLANT
CRADLE DONUT ADULT HEAD (MISCELLANEOUS) ×3 IMPLANT
DRAIN CHANNEL 28F RND 3/8 FF (WOUND CARE) ×3 IMPLANT
DRAPE SLUSH/WARMER DISC (DRAPES) ×3 IMPLANT
DRSG COVADERM 4X14 (GAUZE/BANDAGES/DRESSINGS) ×3 IMPLANT
ELECT REM PT RETURN 9FT ADLT (ELECTROSURGICAL) ×6
ELECTRODE REM PT RTRN 9FT ADLT (ELECTROSURGICAL) ×4 IMPLANT
FELT TEFLON 1X6 (MISCELLANEOUS) ×3 IMPLANT
GAUZE SPONGE 4X4 12PLY STRL (GAUZE/BANDAGES/DRESSINGS) ×3 IMPLANT
GLOVE SURG SIGNA 7.5 PF LTX (GLOVE) ×9 IMPLANT
GOWN STRL REUS W/ TWL LRG LVL3 (GOWN DISPOSABLE) ×14 IMPLANT
GOWN STRL REUS W/ TWL XL LVL3 (GOWN DISPOSABLE) ×2 IMPLANT
GOWN STRL REUS W/TWL LRG LVL3 (GOWN DISPOSABLE) ×7
GOWN STRL REUS W/TWL XL LVL3 (GOWN DISPOSABLE) ×1
GUIDEWIRE ORTHO .078X20 SS ATK (WIRE) ×3 IMPLANT
HEMOSTAT POWDER SURGIFOAM 1G (HEMOSTASIS) ×9 IMPLANT
HEMOSTAT SURGICEL 2X14 (HEMOSTASIS) ×3 IMPLANT
INSERT FOGARTY XLG (MISCELLANEOUS) IMPLANT
KIT BASIN OR (CUSTOM PROCEDURE TRAY) ×3 IMPLANT
KIT ROOM TURNOVER OR (KITS) ×3 IMPLANT
KIT SUCTION CATH 14FR (SUCTIONS) ×6 IMPLANT
LINE VENT (MISCELLANEOUS) ×6 IMPLANT
NEEDLE AORTIC AIR ASPIRATING (NEEDLE) ×3 IMPLANT
NS IRRIG 1000ML POUR BTL (IV SOLUTION) ×18 IMPLANT
PACK OPEN HEART (CUSTOM PROCEDURE TRAY) ×3 IMPLANT
PAD ARMBOARD 7.5X6 YLW CONV (MISCELLANEOUS) ×6 IMPLANT
SET CARDIOPLEGIA MPS 5001102 (MISCELLANEOUS) ×3 IMPLANT
SPONGE INTESTINAL PEANUT (DISPOSABLE) ×3 IMPLANT
STOPCOCK 4 WAY LG BORE MALE ST (IV SETS) ×3 IMPLANT
SUT BONE WAX W31G (SUTURE) ×3 IMPLANT
SUT ETHIBON 2 0 V 52N 30 (SUTURE) ×6 IMPLANT
SUT ETHIBON EXCEL 2-0 V-5 (SUTURE) IMPLANT
SUT ETHIBOND 2 0 SH (SUTURE) ×2
SUT ETHIBOND 2 0 SH 36X2 (SUTURE) ×4 IMPLANT
SUT ETHIBOND 2 0 V4 (SUTURE) IMPLANT
SUT ETHIBOND 2 0V4 GREEN (SUTURE) IMPLANT
SUT ETHIBOND 4 0 RB 1 (SUTURE) IMPLANT
SUT ETHIBOND V-5 VALVE (SUTURE) IMPLANT
SUT PROLENE 3 0 SH 1 (SUTURE) IMPLANT
SUT PROLENE 3 0 SH DA (SUTURE) ×3 IMPLANT
SUT PROLENE 4 0 RB 1 (SUTURE) ×7
SUT PROLENE 4-0 RB1 .5 CRCL 36 (SUTURE) ×14 IMPLANT
SUT SILK  1 MH (SUTURE) ×1
SUT SILK 1 MH (SUTURE) ×2 IMPLANT
SUT STEEL 6MS V (SUTURE) ×3 IMPLANT
SUT STEEL SZ 6 DBL 3X14 BALL (SUTURE) ×3 IMPLANT
SUT VIC AB 1 CTX 36 (SUTURE) ×2
SUT VIC AB 1 CTX36XBRD ANBCTR (SUTURE) ×4 IMPLANT
SUT VIC AB 2-0 CTX 27 (SUTURE) IMPLANT
SUT VIC AB 3-0 X1 27 (SUTURE) IMPLANT
SUTURE E-PAK OPEN HEART (SUTURE) ×3 IMPLANT
SYR 10ML KIT SKIN ADHESIVE (MISCELLANEOUS) IMPLANT
SYSTEM SAHARA CHEST DRAIN ATS (WOUND CARE) ×3 IMPLANT
TAPE CLOTH SURG 4X10 WHT LF (GAUZE/BANDAGES/DRESSINGS) ×3 IMPLANT
TAPE PAPER 2X10 WHT MICROPORE (GAUZE/BANDAGES/DRESSINGS) ×3 IMPLANT
TOWEL OR 17X24 6PK STRL BLUE (TOWEL DISPOSABLE) ×6 IMPLANT
TOWEL OR 17X26 10 PK STRL BLUE (TOWEL DISPOSABLE) ×6 IMPLANT
TRAY FOLEY IC TEMP SENS 16FR (CATHETERS) IMPLANT
TUBE SUCT INTRACARD DLP 20F (MISCELLANEOUS) ×3 IMPLANT
UNDERPAD 30X30 (UNDERPADS AND DIAPERS) ×3 IMPLANT
VALVE MAGNA EASE 21MM (Prosthesis & Implant Heart) ×3 IMPLANT
VENT LEFT HEART 12002 (CATHETERS) ×3
WATER STERILE IRR 1000ML POUR (IV SOLUTION) ×6 IMPLANT

## 2016-02-25 NOTE — Anesthesia Procedure Notes (Addendum)
Central Venous Catheter Insertion Performed by: Oleta Mouse, anesthesiologist Start/End12/30/2017 8:21 AM, 02/25/2016 8:31 AM Patient location: OR. Preanesthetic checklist: patient identified, IV checked, site marked, risks and benefits discussed, surgical consent, monitors and equipment checked, pre-op evaluation, timeout performed and anesthesia consent Patient sedated Hand hygiene performed  and maximum sterile barriers used  Catheter size: 9 Fr Total catheter length 10. MAC introducer Procedure performed without using ultrasound guided technique. Attempts: 1 Following insertion, line sutured. Post procedure assessment: blood return through all ports, free fluid flow and no air  Patient tolerated the procedure well with no immediate complications.

## 2016-02-25 NOTE — Anesthesia Preprocedure Evaluation (Signed)
Anesthesia Evaluation  Patient identified by MRN, date of birth, ID band Patient awake    Reviewed: Allergy & Precautions, H&P , NPO status , Patient's Chart, lab work & pertinent test results  History of Anesthesia Complications Negative for: history of anesthetic complications  Airway Mallampati: III  TM Distance: >3 FB Neck ROM: Full    Dental no notable dental hx. (+) Teeth Intact, Dental Advisory Given   Pulmonary neg pulmonary ROS,    Pulmonary exam normal breath sounds clear to auscultation       Cardiovascular +CHF  + Valvular Problems/Murmurs AI  Rhythm:Regular Rate:Normal     Neuro/Psych  Headaches, negative psych ROS   GI/Hepatic Neg liver ROS, GERD  Medicated and Controlled,  Endo/Other  Morbid obesity  Renal/GU ESRF and DialysisRenal disease  negative genitourinary   Musculoskeletal   Abdominal   Peds  Hematology negative hematology ROS (+) anemia ,   Anesthesia Other Findings   Reproductive/Obstetrics negative OB ROS                            Anesthesia Physical Anesthesia Plan  ASA: IV  Anesthesia Plan: General   Post-op Pain Management:    Induction: Intravenous  Airway Management Planned: Oral ETT  Additional Equipment: Arterial line, TEE, PA Cath and CVP  Intra-op Plan:   Post-operative Plan: Post-operative intubation/ventilation  Informed Consent: I have reviewed the patients History and Physical, chart, labs and discussed the procedure including the risks, benefits and alternatives for the proposed anesthesia with the patient or authorized representative who has indicated his/her understanding and acceptance.   Dental advisory given  Plan Discussed with: CRNA and Surgeon  Anesthesia Plan Comments:         Anesthesia Quick Evaluation

## 2016-02-25 NOTE — Progress Notes (Signed)
[facility] Progress Note   Subjective:  Went for AVR this am.  Is post-op, still intubated.  CXR postop showed mild pulm edema.  PA pressures 30/ 15.    Objective Vitals:   02/25/16 1515 02/25/16 1530 02/25/16 1545 02/25/16 1600  BP:    (!) 96/53  Pulse: 89 89 89 89  Resp: (!) 24 (!) 29 (!) 26 (!) 27  Temp: 97.2 F (36.2 C) 97 F (36.1 C) 97.2 F (36.2 C) 97 F (36.1 C)  TempSrc:      SpO2: 100% 100% 100% 100%  Weight:      Height:       Physical Exam General: intubated, on vent Heart: RRR; 2/6 systolic murmur Lungs: CTAB Abdomen: soft, non-tender Extremities: No LE edema. RUE with intact sutures, no drainage. Dialysis Access: TDC in L chest; no tenderness/erythema.  Dialysis Orders: MWF at [facility]  4h 99.5kg 2/2 bath RUE AVG (now removed)/ new L IJ cath Hep 6000 w 2500 midrun - Mircera 225mg IV q 2 weeks (last given 12/13) - Calcitriol 0.559m PO q HD  Assessment: 1. 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 Staph lugdenensis bacteremia w AVG infection: R AVG resection 12/22, IV Ancef 6 weeks 3. ESRD: MWF HD 4. Hypotension: chronic on midodrine 5. Volume: get a good bed wt postop 6. Anemia of CKD/ABL: recent vaginal/ AVG bleeding. Hgb 8's and stable. s/p 4U prbc's since admission. Tsat low, but holding off on IV iron in setting of bacteremia. Aranesp 10062mresumed 12/27, increase to 200m55meekly for 1/3. 7. Secondary hyperparathyroidism:Ca ok. Continue binders/VDRA/sensipar. 8. Endometrial hyperplasia: Megace on hold  Plan - reassess in am for possible need for HD   [name] Kelly SplinterCaroCastleforder [phone]/29/2017, 11:48 AM  Additional Objective Labs: Basic Metabolic Panel:  Recent Labs Lab 02/22/16 0806 02/23/16 0537 02/24/16 0653 02/25/16 0236  02/25/16 0955 02/25/16 1043 02/25/16 1136 02/25/16 1300  NA 133* 132* 133* 133*  < > 136 135 136 134*  K 4.2 3.3* 3.5 4.0  < > 3.9 4.2 4.2 4.0   CL 95* 97* 96* 98*  < > 99* 101 101  --   CO2 29 29 28 26   --   --   --   --   --   GLUCOSE 92 86 97 97  < > 124* 120* 134* 156*  BUN 26* 11 16 8   < > 10 11 11   --   CREATININE 7.08* 4.16* 6.07* 3.55*  < > 3.40* 3.60* 3.90*  --   CALCIUM 9.6 8.8* 9.8 9.4  --   --   --   --   --   PHOS 4.4 3.4 4.3  --   --   --   --   --   --   < > = values in this interval not displayed. Liver Function Tests:  Recent Labs Lab 02/22/16 0806 02/23/16 0537 02/24/16 0653  ALBUMIN 2.0* 1.9* 2.0*   CBC:  Recent Labs Lab 02/22/16 0553 02/23/16 0537 02/24/16 0654 02/25/16 0236  02/25/16 1047 02/25/16 1136 02/25/16 1300  WBC 14.7* 13.5* 12.6* 13.3*  --   --   --  27.1*  HGB 8.6* 8.2* 8.6* 8.3*  < > 7.6* 8.2* 10.2*  9.0*  HCT 26.4* 25.5* 27.3* 26.0*  < > 23.0* 24.0* 30.0*  27.2*  MCV 82.5 83.6 86.9 85.2  --   --   --  85.5  PLT 367 383 420* 487*  --  329  --  257  < > = values in this interval not displayed. Blood Culture    Component Value Date/Time   SDES BLOOD LEFT HAND 02/20/2016 0010   SPECREQUEST IN PEDIATRIC BOTTLE 3CC 02/20/2016 0010   CULT NO GROWTH 5 DAYS 02/20/2016 0010   REPTSTATUS 02/25/2016 FINAL 02/20/2016 0010   Medications: . sodium chloride 20 mL/hr at 02/25/16 1500  . [START ON 02/26/2016] sodium chloride    . sodium chloride 10 mL/hr at 02/25/16 1500  . sodium chloride 20 mL/hr at 02/25/16 1500  . dexmedetomidine Stopped (02/25/16 1425)  . insulin (NOVOLIN-R) infusion 3.4 Units/hr (02/25/16 1605)  . lactated ringers    . lactated ringers    . nitroGLYCERIN Stopped (02/25/16 1239)  . norepinephrine (LEVOPHED) Adult infusion 10 mcg/min (02/25/16 1551)  . phenylephrine (NEO-SYNEPHRINE) Adult infusion Stopped (02/25/16 1239)   . [START ON 02/26/2016] acetaminophen  1,000 mg Oral Q6H   Or  . [START ON 02/26/2016] acetaminophen (TYLENOL) oral liquid 160 mg/5 mL  1,000 mg Per Tube Q6H  . [START ON 02/26/2016] aspirin EC  325 mg Oral Daily   Or  . [START ON  02/26/2016] aspirin  324 mg Per Tube Daily  . [START ON 02/26/2016] bisacodyl  10 mg Oral Daily   Or  . [START ON 02/26/2016] bisacodyl  10 mg Rectal Daily  .  ceFAZolin (ANCEF) IV  2 g Intravenous Q M,W,F-1800  . chlorhexidine gluconate (MEDLINE KIT)  15 mL Mouth Rinse BID  . cinacalcet  60 mg Oral QPM  . [START ON 02/29/2016] darbepoetin (ARANESP) injection - DIALYSIS  200 mcg Intravenous Q Wed-HD  . [START ON 02/26/2016] docusate sodium  200 mg Oral Daily  . famotidine (PEPCID) IV  20 mg Intravenous Q24H  . insulin regular  0-10 Units Intravenous TID WC  . magnesium sulfate  4 g Intravenous Once  . mouth rinse  15 mL Mouth Rinse QID  . metoprolol tartrate  12.5 mg Oral BID   Or  . metoprolol tartrate  12.5 mg Per Tube BID  . [START ON 02/27/2016] pantoprazole  40 mg Oral Daily  . potassium chloride (KCL MULTIRUN) 30 mEq in 265 mL IVPB  30 mEq Intravenous Once  . rifampin  300 mg Oral Q12H  . sevelamer carbonate  1,600 mg Oral TID WC  . [START ON 02/26/2016] sodium chloride flush  3 mL Intravenous Q12H  . vancomycin  750 mg Intravenous Once

## 2016-02-25 NOTE — Anesthesia Postprocedure Evaluation (Signed)
Anesthesia Post Note  Patient: [name] Marks  Procedure(s) Performed: Procedure(s) (LRB): AORTIC VALVE REPLACEMENT (AVR) implanted with Magna Ease Aortic valve size 5mm (N/A) TRANSESOPHAGEAL ECHOCARDIOGRAM (TEE) (N/A)  Patient location during evaluation: ICU Anesthesia Type: General Level of consciousness: sedated and patient remains intubated per anesthesia plan Vital Signs Assessment: post-procedure vital signs reviewed and stable Respiratory status: patient remains intubated per anesthesia plan and patient on ventilator - see flowsheet for VS Cardiovascular status: stable Anesthetic complications: no       Last Vitals:  Vitals:   02/25/16 0507 02/25/16 1245  BP: (!) 117/30   Pulse: 88 88  Resp: (!) 24 18  Temp: 36.9 C     Last Pain:  Vitals:   02/25/16 0507  TempSrc: Oral  PainSc:                  Marinda Elk

## 2016-02-25 NOTE — Progress Notes (Signed)
SICU wean protocol started

## 2016-02-25 NOTE — Addendum Note (Signed)
Addendum  created 02/25/16 1439 by Sheppard Plumber, CRNA   Anesthesia Intra Flowsheets edited

## 2016-02-25 NOTE — Transfer of Care (Signed)
Immediate Anesthesia Transfer of Care Note  Patient: [name] Marks  Procedure(s) Performed: Procedure(s): AORTIC VALVE REPLACEMENT (AVR) implanted with Magna Ease Aortic valve size 39mm (N/A) TRANSESOPHAGEAL ECHOCARDIOGRAM (TEE) (N/A)  Patient Location: ICU  Anesthesia Type:General  Level of Consciousness: sedated and Patient remains intubated per anesthesia plan  Airway & Oxygen Therapy: Patient remains intubated per anesthesia plan and Patient placed on Ventilator (see vital sign flow sheet for setting)  Post-op Assessment: Report given to RN and Post -op Vital signs reviewed and stable  Post vital signs: Reviewed and stable  Last Vitals:  Vitals:   02/25/16 0507 02/25/16 1245  BP: (!) 117/30   Pulse: 88 88  Resp: (!) 24 18  Temp: 36.9 C     Last Pain:  Vitals:   02/25/16 0507  TempSrc: Oral  PainSc:       Patients Stated Pain Goal: 2 (19/14/78 2956)  Complications: No apparent anesthesia complications

## 2016-02-25 NOTE — Procedures (Signed)
Extubation Procedure Note  Patient Details:   Name: [name] Marks DOB: 30-May-1955 MRN: [id number]   Airway Documentation:  Airway 7.5 mm (Active)  Secured at (cm) 22 cm 02/25/2016  8:10 PM  Measured From Lips 02/25/2016  8:10 PM  Secured Location Right 02/25/2016  8:10 PM  Secured By Pink Tape 02/25/2016  8:10 PM  Site Condition Dry 02/25/2016  4:35 PM    Evaluation  O2 sats: stable throughout Complications: No apparent complications Patient did tolerate procedure well. Bilateral Breath Sounds: Clear, Diminished   Yes   Patient extubated with no complications. Patient had positive cuff leak and was able to perform parameters NIF -30 and VC 650 mL with good effort.  Patient placed on 2 L [place]   Carrington Clamp A 02/25/2016, 8:51 PM

## 2016-02-25 NOTE — Anesthesia Procedure Notes (Signed)
Central Venous Catheter Insertion Performed by: Oleta Mouse, anesthesiologist Start/End12/30/2017 8:27 AM, 02/25/2016 8:33 AM Patient location: OR. Preanesthetic checklist: patient identified, IV checked, site marked, risks and benefits discussed, surgical consent, monitors and equipment checked, pre-op evaluation, timeout performed and anesthesia consent Hand hygiene performed  and maximum sterile barriers used  Total catheter length 40. PA cath was placed.[place]wan type:thermodilution Procedure performed without using ultrasound guided technique. Attempts: 1 Patient tolerated the procedure well with no immediate complications.

## 2016-02-25 NOTE — H&P (View-Only) (Signed)
Reason for Consult:Aortic valve endocarditis Referring Physician: Dr. Schertz  [name] Marks is an 60 y.o. female.  HPI: Ms. Marks is a 60 yo woman with a history of ESRD (unknown primary etiology) on HD for many years. She also has a history of GERD and gout, but no diabetes or hypertension. She was admitted with altered mental status on 12/20. CT head was negative for stroke. She was found to an infected AV graft in the right arm. Blood cultures were positive for Staph lugdenensis. She is on Ancef. Her RUE graft was removed on 12/22 and she has a temporary HD catheter in the left subclavian. A TTE on 12/23 showed moderate AI and MR ans suggested a possible vegetation on the AV. She had a TEE on 12/27 which showed vegetations on the right and noncoronary cusps with prolapse of the leaflets and severe AI. EF was normal with an EF of 60%. She was also noted to have a widened pulse pressure for the first time. She was also noted to have a left pleural effusion.  She is HD currently. She denies chest pain or shortness of breath. She is in no distress.  Past Medical History:  Diagnosis Date  . Complication of anesthesia    due to kidney disease  . End stage renal disease on dialysis ([place])   . GERD (gastroesophageal reflux disease)   . Gout     Past Surgical History:  Procedure Laterality Date  . [place] REMOVAL Right 02/17/2016   Procedure: REMOVAL OF TWO ARTERIOVENOUS GORETEX GRAFTS ([place]);  Surgeon: Angelia Mould, MD;  Location: [facility] OR;  Service: Vascular;  Laterality: Right;  . DG AV DIALYSIS GRAFT DECLOT OR    . DILATATION & CURETTAGE/HYSTEROSCOPY WITH TRUECLEAR N/A 11/06/2012   Procedure: DILATATION & CURETTAGE/HYSTEROSCOPY WITH TRUECLEAR;  Surgeon: Terrance Mass, MD;  Location: [facility];  Service: Gynecology;  Laterality: N/A;  Truclear Resectoscopic Polypectomy   . INSERTION OF DIALYSIS CATHETER N/A 02/19/2016   Procedure: INSERTION OF Left Internal Jugular DIALYSIS CATHETER;   Surgeon: Angelia Mould, MD;  Location: [place];  Service: Vascular;  Laterality: N/A;  . PATCH ANGIOPLASTY Right 02/17/2016   Procedure: PATCH ANGIOPLASTY;  Surgeon: Angelia Mould, MD;  Location: [place];  Service: Vascular;  Laterality: Right;  . PERIPHERAL VASCULAR CATHETERIZATION N/A 09/14/2014   Procedure: A/V Shuntogram/Fistulagram;  Surgeon: Katha Cabal, MD;  Location: [place] [facility];  Service: Cardiovascular;  Laterality: N/A;  . PERIPHERAL VASCULAR CATHETERIZATION N/A 09/14/2014   Procedure: A/V Shunt Intervention;  Surgeon: Katha Cabal, MD;  Location: [place] [facility];  Service: Cardiovascular;  Laterality: N/A;  . PERIPHERAL VASCULAR CATHETERIZATION Right 12/09/2014   Procedure: A/V Shuntogram/Fistulagram;  Surgeon: Algernon Huxley, MD;  Location: [place] [facility];  Service: Cardiovascular;  Laterality: Right;  . PERIPHERAL VASCULAR CATHETERIZATION N/A 12/09/2014   Procedure: A/V Shunt Intervention;  Surgeon: Algernon Huxley, MD;  Location: [place] [facility];  Service: Cardiovascular;  Laterality: N/A;  . PERIPHERAL VASCULAR CATHETERIZATION Right 05/24/2015   Procedure: A/V Shuntogram;  Surgeon: Serafina Mitchell, MD;  Location: [place] [facility];  Service: Cardiovascular;  Laterality: Right;  . PERIPHERAL VASCULAR CATHETERIZATION Right 05/24/2015   Procedure: Peripheral Vascular Balloon Angioplasty;  Surgeon: Serafina Mitchell, MD;  Location: [place] [facility];  Service: Cardiovascular;  Laterality: Right;  right arm shunt  . PERIPHERAL VASCULAR CATHETERIZATION N/A 06/13/2015   Procedure: A/V Shuntogram/Fistulagram;  Surgeon: Algernon Huxley, MD;  Location:  [place] [facility];  Service: Cardiovascular;  Laterality: N/A;  . PERIPHERAL VASCULAR CATHETERIZATION N/A 06/13/2015   Procedure: A/V Shunt Intervention;  Surgeon: Algernon Huxley, MD;  Location: [place] [facility];  Service: Cardiovascular;  Laterality: N/A;  . TEE WITHOUT CARDIOVERSION N/A 02/22/2016    Procedure: TRANSESOPHAGEAL ECHOCARDIOGRAM (TEE);  Surgeon: Pixie Casino, MD;  Location: [facility];  Service: Cardiovascular;  Laterality: N/A;  . TUBAL LIGATION  1983    Family History  Problem Relation Age of Onset  . Diabetes Mother   . Hypertension Mother   . Heart disease Mother   . Alcohol abuse Mother   . Kidney disease Mother   . Cancer Father     COLON  . Alcohol abuse Father   . Breast cancer Sister 54  . Hypertension Sister   . Kidney disease Sister   . Hypertension Son     Social History:  reports that she has never smoked. She has never used smokeless tobacco. She reports that she does not drink alcohol or use drugs.  Allergies:  Allergies  Allergen Reactions  . Contrast Media [Iodinated Diagnostic Agents] Anaphylaxis  . Naproxen Sodium Itching    Medications:  Scheduled: . aspirin EC  81 mg Oral Daily  .  ceFAZolin (ANCEF) IV  2 g Intravenous Q M,W,F-1800  . cinacalcet  60 mg Oral QPM  . [START ON 02/29/2016] darbepoetin (ARANESP) injection - DIALYSIS  200 mcg Intravenous Q Wed-HD  . feeding supplement (NEPRO CARB STEADY)  237 mL Oral BID BM  . heparin  5,000 Units Subcutaneous Q8H  . ipratropium  2 spray Each Nare QID  . midodrine      . midodrine  10 mg Oral TID WC  . multivitamin  1 tablet Oral QHS  . pantoprazole  40 mg Oral BID  . rifampin  300 mg Oral Q12H  . sevelamer carbonate  1,600 mg Oral TID WC  . sodium chloride  500 mL Intravenous Once    Results for orders placed or performed during the hospital encounter of 02/15/16 (from the past 48 hour(s))  Renal function panel     Status: Abnormal   Collection Time: 02/23/16  5:37 AM  Result Value Ref Range   Sodium 132 (L) 135 - 145 mmol/L   Potassium 3.3 (L) 3.5 - 5.1 mmol/L    Comment: DELTA CHECK NOTED NO VISIBLE HEMOLYSIS    Chloride 97 (L) 101 - 111 mmol/L   CO2 29 22 - 32 mmol/L   Glucose, Bld 86 65 - 99 mg/dL   BUN 11 6 - 20 mg/dL   Creatinine, Ser 4.16 (H) 0.44 - 1.00 mg/dL     Comment: DELTA CHECK NOTED   Calcium 8.8 (L) 8.9 - 10.3 mg/dL   Phosphorus 3.4 2.5 - 4.6 mg/dL   Albumin 1.9 (L) 3.5 - 5.0 g/dL   GFR calc non Af Amer 11 (L) >60 mL/min   GFR calc Af Amer 12 (L) >60 mL/min    Comment: (NOTE) The eGFR has been calculated using the CKD EPI equation. This calculation has not been validated in all clinical situations. eGFR's persistently <60 mL/min signify possible Chronic Kidney Disease.    Anion gap 6 5 - 15  CBC     Status: Abnormal   Collection Time: 02/23/16  5:37 AM  Result Value Ref Range   WBC 13.5 (H) 4.0 - 10.5 K/uL   RBC 3.05 (L) 3.87 - 5.11 MIL/uL   Hemoglobin 8.2 (L) 12.0 -  15.0 g/dL   HCT 25.5 (L) 36.0 - 46.0 %   MCV 83.6 78.0 - 100.0 fL   MCH 26.9 26.0 - 34.0 pg   MCHC 32.2 30.0 - 36.0 g/dL   RDW 18.8 (H) 11.5 - 15.5 %   Platelets 383 150 - 400 K/uL  Renal function panel     Status: Abnormal   Collection Time: 02/24/16  6:53 AM  Result Value Ref Range   Sodium 133 (L) 135 - 145 mmol/L   Potassium 3.5 3.5 - 5.1 mmol/L   Chloride 96 (L) 101 - 111 mmol/L   CO2 28 22 - 32 mmol/L   Glucose, Bld 97 65 - 99 mg/dL   BUN 16 6 - 20 mg/dL   Creatinine, Ser 6.07 (H) 0.44 - 1.00 mg/dL   Calcium 9.8 8.9 - 10.3 mg/dL   Phosphorus 4.3 2.5 - 4.6 mg/dL   Albumin 2.0 (L) 3.5 - 5.0 g/dL   GFR calc non Af Amer 7 (L) >60 mL/min   GFR calc Af Amer 8 (L) >60 mL/min    Comment: (NOTE) The eGFR has been calculated using the CKD EPI equation. This calculation has not been validated in all clinical situations. eGFR's persistently <60 mL/min signify possible Chronic Kidney Disease.    Anion gap 9 5 - 15  CBC     Status: Abnormal   Collection Time: 02/24/16  6:54 AM  Result Value Ref Range   WBC 12.6 (H) 4.0 - 10.5 K/uL   RBC 3.14 (L) 3.87 - 5.11 MIL/uL   Hemoglobin 8.6 (L) 12.0 - 15.0 g/dL   HCT 27.3 (L) 36.0 - 46.0 %   MCV 86.9 78.0 - 100.0 fL   MCH 27.4 26.0 - 34.0 pg   MCHC 31.5 30.0 - 36.0 g/dL   RDW 19.0 (H) 11.5 - 15.5 %   Platelets 420  (H) 150 - 400 K/uL    No results found.  Review of Systems  Constitutional: Positive for malaise/fatigue. Negative for chills and fever.  Respiratory: Negative for shortness of breath and wheezing.   Cardiovascular: Negative for chest pain, orthopnea, claudication and leg swelling.  Gastrointestinal: Positive for heartburn. Negative for blood in stool.  Genitourinary:       ESRD  Musculoskeletal: Positive for joint pain (history of gout).  Skin: Positive for itching and rash.  Neurological: Positive for loss of consciousness. Negative for focal weakness and seizures.       Presented with altered mental status  All other systems reviewed and are negative.  Blood pressure (!) 98/35, pulse 81, temperature 97 F (36.1 C), temperature source Oral, resp. rate 18, height _0  (1.549 m), weight 215 lb 9.8 oz (97.8 kg), SpO2 100 %. Physical Exam  Vitals reviewed. Constitutional: She is oriented to person, place, and time. She appears well-developed and well-nourished. No distress.  HENT:  Head: Normocephalic and atraumatic.  Eyes: Conjunctivae and EOM are normal. No scleral icterus.  Neck: No thyromegaly present.  No carotid bruits  Cardiovascular: Normal rate and regular rhythm.   Murmur (continuous systolic and diastolic murmur RUSB, diastolic audible at LLSB) heard. Pulses:      Femoral pulses are 3+ on the right side, and 3+ on the left side.      Dorsalis pedis pulses are 2+ on the right side, and 3+ on the left side.  Respiratory: Effort normal. No respiratory distress.  Diminished BS at bases L>R  GI: Soft. She exhibits no distension. There is no tenderness.  Musculoskeletal:  She exhibits no edema.  Neurological: She is alert and oriented to person, place, and time. No cranial nerve deficit. She exhibits normal muscle tone.  Skin: Skin is warm and dry.  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 She is afebrile on IV Ancef. A TEE on 12/27 showed severe AI with vegetations and marked prolapse of the right and noncoronary cusps. She is currently well compensated but has an extremely low diastolic pressure. In my opinion she needs aortic valve replacement for acute severe AI in the setting of endocarditis. Her most recent blood cultures from 12/25 are no growth at 3 days and her culture from 12/23 was negative, so her bacteremia is cleared. I think it is best to go ahead with AVR before she develops decompensated heart failure.   I discussed AVR with her. She has been on HD for many years and is relatively healthy with no DM. I think her life expectancy is better than the average HD patient her age presuming she gets through this acute illness, therefore, I would favor a mechanical valve in her case. I discussed mechanical v bioprosthetic valves with her and their relative advantages and disadvantages. She is in agreement with mechanical valve replacement understanding she will need lifelong anticoagulation.  I discussed the general nature of the procedure, the need for general anesthesia, the use of cardiopulmonary bypass, and the incisions to be used with Ms. Marks. we discussed the expected hospital stay, overall recovery and short and long term outcomes. She understands the risks include, but are not limited to death, stroke, MI, DVT/PE, bleeding, possible need for transfusion, infections, heart block, cardiac arrhythmias, and other organ system dysfunction including respiratory or GI complications.   She accepts the risks and agrees to proceed.  Will need cardiac catheterization prior to AVR  Melrose Nakayama 02/24/2016, 9:18 AM

## 2016-02-25 NOTE — Interval H&P Note (Signed)
History and Physical Interval Note:  02/25/2016 7:16 AM  [name] Marks  has presented today for surgery, with the diagnosis of SEVERE AI AV ENDOCARDITIS  The various methods of treatment have been discussed with the patient and family. After consideration of risks, benefits and other options for treatment, the patient has consented to  Procedure(s): AORTIC VALVE REPLACEMENT (AVR) (N/A) TRANSESOPHAGEAL ECHOCARDIOGRAM (TEE) (N/A) as a surgical intervention .  The patient's history has been reviewed, patient examined, no change in status, stable for surgery.  I have reviewed the patient's chart and labs.  Questions were answered to the patient's satisfaction.     Melrose Nakayama

## 2016-02-25 NOTE — Progress Notes (Signed)
Family Medicine Teaching Service Daily Progress Note Intern Pager: [phone]  Patient name: [name] Marks Medical record number: [id number] Date of birth: [birth year] Age: 60 y.o. Gender: female  Primary Care Provider: Lockie Pares, MD Consultants: Nephrology, ID, vascular, CVTS, cardiology Code Status: Full   Pt Overview and Major Events to Date:  12/22 Removal of infected R arm graft with bovine pericardial patch angioplasty of R brachial artery, and removal of R forearm graft. 12/24 Placement of tunneled catheter 12/27 TEE with vegetation on aortic valve, severe AI  12/20 Aortic valve replacement   Assessment and Plan: [name] Marks is a 60 y.o. female presenting with AMS . PMH is significant for ESRD on HD, complex atypical endometrial hyperplasia   Bacteremia from AV graft infection s/p infected graft removal- Patient with 2/2 blood cultures positive for Coagulase negative staphylococcus from AV graft infection. She is s/p removal on 12/22. 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  12/22 (after graft removal) Bcx also with STAPHYLOCOCCUS LUGDUNENSIS. Concern for endocarditis, TTE with thickened aortic valve, can't rule out vegetation.  WBC 16.2 >>13.1. Afebrile.  Back pain is unlikely to be sign of deep infection given MRI of L spine neg for osteomyelitis or epidural abscess. TEE with vegetation and severe aortic insufficiency.  - Ancef per pharmacy, rixamin added per ID for 6 week course of IV ABX  - Wound culture with moderate Staph lugdenensis, sensitive to penicillins - 12/23 BC NG x 4 days, 12/25 BCx currently NG x2 day - Care to transition to CVTS team post-op  Vegetation on Aortic Valve, severe AI: Cath performed 12/29 and showed normal coronaries. Evaluated by CVTS, who recommended bioprosthetic valve replacement.  - Aortic valve replacement today per CVTS  Deconditioning- likely from acute illness/ decreased mobility during hospitalization - PT/ OT - rec SNF and rolling  walker  - Continue to mobilize patient as tolerated.  ESRD on MWF schedule. Graft removed 12/22. Tunneled HD catheter placed (12/24). Midodrine increased to TID 12/26.  - follow renal function with dialysis  - nephrology following - monitor RFP   Anemia - Stable, Hgb drop likely due to fistula bleeding and has no more bleeding from site post op. Baseline Hgb 11. Has had 4units pRBCs total this admission, last 12/22. Hgb dropped further to 8.3 this AM.  - Monitor CBC  AMS, resolved. Likely 2/2 to bacteremia from AV graft infection, resolved. On admit head CT/MRI without acute findings.   Complex Atypical Endometrial Hyperplasia - hold Megace per nephro  FEN/ GI: Renal diet, SLIV DVT prx: heparin sub-q   Disposition: SNF per PT recs   Subjective:  Patient seen by CVTS yesterday, and decided to move forward with bioprosthetic aortic valve replacement today. Per op note, patient tolerated procedure well, and is now intubated in ICU. Care will be transitioned to CVTS team.   Objective: Temp:  [95.7 F (35.4 C)-98.6 F (37 C)] 97.2 F (36.2 C) (12/30 1545) Pulse Rate:  [74-113] 89 (12/30 1545) Resp:  [8-47] 26 (12/30 1545) BP: (90-139)/(18-68) 99/68 (12/30 1500) SpO2:  [87 %-100 %] 100 % (12/30 1545) Arterial Line BP: (92-136)/(46-79) 92/48 (12/30 1545) FiO2 (%):  [50 %] 50 % (12/30 1245)   Physical Exam: General: sedated in ICU bed, intubated post-op Cardiovascular: RRR, grade 2/6 systolic murmur. Marland Kitchen  Respiratory: Inbutabed Abdomen: BS+, soft, non-distended Neuro: Unable to assess as patient sedated Psych: Unable to assess as patient sedated  Laboratory:  Recent Labs Lab 02/24/16 0654 02/25/16 0236  02/25/16 1047 02/25/16 1136  02/25/16 1300  WBC 12.6* 13.3*  --   --   --  27.1*  HGB 8.6* 8.3*  < > 7.6* 8.2* 10.2*  9.0*  HCT 27.3* 26.0*  < > 23.0* 24.0* 30.0*  27.2*  PLT 420* 487*  --  329  --  257  < > = values in this interval not displayed.  Recent Labs Lab  02/23/16 0537 02/24/16 0653 02/25/16 0236  02/25/16 0955 02/25/16 1043 02/25/16 1136 02/25/16 1300  NA 132* 133* 133*  < > 136 135 136 134*  K 3.3* 3.5 4.0  < > 3.9 4.2 4.2 4.0  CL 97* 96* 98*  < > 99* 101 101  --   CO2 29 28 26   --   --   --   --   --   BUN 11 16 8   < > 10 11 11   --   CREATININE 4.16* 6.07* 3.55*  < > 3.40* 3.60* 3.90*  --   CALCIUM 8.8* 9.8 9.4  --   --   --   --   --   GLUCOSE 86 97 97  < > 124* 120* 134* 156*  < > = values in this interval not displayed. Imaging/Diagnostic Tests: Dg Chest Port 1 View  Result Date: 02/25/2016 CLINICAL DATA:  Post aortic valve replacement. EXAM: PORTABLE CHEST 1 VIEW COMPARISON:  02/19/2016 FINDINGS: Patient has been intubated, submitted endotracheal tube approximately 3 cm above the carina. Nasogastric tube is in place with tip off the film beyond the level of the proximal stomach. Mediastinal drains are in place. Left-sided dialysis catheter tip overlies the level of the right atrium. Right Swan-Ganz catheter has been placed, tip overlying the level of the right pulmonary artery. The patient has and subclavian stent. Heart is mildly enlarged. There is mild pulmonary edema. No pneumothorax. Small pneumopericardium or pneumomediastinum. IMPRESSION: 1. Postoperative changes. 2. Mild pulmonary edema. 3. Small pneumopericardium or pneumomediastinum. Electronically Signed   By: Nolon Nations M.D.   On: 02/25/2016 13:10    Verner Mould, MD 02/25/2016, 3:49 PM PGY-2, [place] Intern pager: [phone], text pages welcome

## 2016-02-25 NOTE — Anesthesia Procedure Notes (Signed)
Arterial Line Insertion Start/End12/30/2017 8:08 AM, 02/25/2016 8:15 AM Performed by: Oleta Mouse  Preanesthetic checklist: patient identified, IV checked, site marked, risks and benefits discussed, surgical consent, monitors and equipment checked, pre-op evaluation, timeout performed and anesthesia consent Patient sedated Left, radial was placed Hand hygiene performed , maximum sterile barriers used  and Seldinger technique used  Attempts: 1 Procedure performed using ultrasound guided technique. Ultrasound Notes:anatomy identified, needle tip was noted to be adjacent to the nerve/plexus identified, no ultrasound evidence of intravascular and/or intraneural injection and image(s) printed for medical record Following insertion, dressing applied and Biopatch. Post procedure assessment: normal  Patient tolerated the procedure well with no immediate complications. Additional procedure comments: 5Fr catheter.

## 2016-02-25 NOTE — Progress Notes (Signed)
      [address]       [place], [address]             [phone]      S/p AVR  BP 105/61 (BP Location: Left Wrist)   Pulse 89   Temp 97.5 F (36.4 C)   Resp 20   Ht 5\' 1"  (1.549 m)   Wt 235 lb 0.2 oz (106.6 kg)   SpO2 100%   BMI 44.40 kg/m   CI- 1.9  On 10 mcg/min of norepinephrine   Intake/Output Summary (Last 24 hours) at 02/25/16 1840 Last data filed at 02/25/16 1800  Gross per 24 hour  Intake          2266.86 ml  Output              580 ml  Net          1686.86 ml    Minimal CT output  CBG mildly elevated K= 4.1  Failed parameters on 1st attempt Awake and grossly intact but falls back asleep quickly  Brent C. Roxan Hockey, MD [facility] [phone]

## 2016-02-25 NOTE — Progress Notes (Signed)
SICU wean protocol re-started.  RN aware.

## 2016-02-25 NOTE — Brief Op Note (Addendum)
02/15/2016 - 02/25/2016  11:16 AM  PATIENT:  [name] Marks  60 y.o. female  PRE-OPERATIVE DIAGNOSIS:  SEVERE AI AV ENDOCARDITIS  POST-OPERATIVE DIAGNOSIS:  SEVERE AI AV ENDOCARDITIS  PROCEDURE:  Procedure(s): AORTIC VALVE REPLACEMENT (AVR) implanted with Magna Ease Aortic valve size 79mm (N/A) TRANSESOPHAGEAL ECHOCARDIOGRAM (TEE) (N/A)  SURGEON:  Surgeon(s) and Role:    * Melrose Nakayama, MD - Primary  PHYSICIAN ASSISTANT: WAYNE GOLD PA-C  ANESTHESIA:   general  EBL:  No intake/output data recorded.  BLOOD ADMINISTERED:none  DRAINS: ROUTINE 53F chest tube, 9F Blake drain  LOCAL MEDICATIONS USED:  NONE  SPECIMEN:  Source of Specimen:  AORTIC VALVE LEAFLETS  DISPOSITION OF SPECIMEN:  PATH AND MICRO  COUNTS:  YES  PLAN OF CARE:ICU STABLE AND INTUBATED  PATIENT DISPOSITION:  ICU - intubated and hemodynamically stable.   Delay start of Pharmacological VTE agent (>24hrs) due to surgical blood loss or risk of bleeding: yes  COMPLICATIONS: NO KNOWN  XC= 64 min CPB= 102 min Tricuspid aortic valve with large vegetations on right and [place] cusps, severe AI. Moderate MR and TR with no vegetations on those valves

## 2016-02-25 NOTE — Progress Notes (Signed)
Came to bedside to perform parameters.  Noted RR 40's.  Unable to get VC, NIF -40 (involuntary w/ cough).  S/p parameters, pt w/ increased WOB, RR 40's, HR increased to 150's.  Pt c/o pain.  Placed pt back on full vent support for now per SICU protocol.  RN in room and aware.

## 2016-02-26 ENCOUNTER — Encounter (HOSPITAL_COMMUNITY): Payer: Self-pay | Admitting: Thoracic Surgery (Cardiothoracic Vascular Surgery)

## 2016-02-26 ENCOUNTER — Inpatient Hospital Stay (HOSPITAL_COMMUNITY): Payer: Medicare Other

## 2016-02-26 DIAGNOSIS — Z992 Dependence on renal dialysis: Secondary | ICD-10-CM | POA: Diagnosis not present

## 2016-02-26 DIAGNOSIS — N186 End stage renal disease: Secondary | ICD-10-CM | POA: Diagnosis not present

## 2016-02-26 DIAGNOSIS — N039 Chronic nephritic syndrome with unspecified morphologic changes: Secondary | ICD-10-CM | POA: Diagnosis not present

## 2016-02-26 LAB — GLUCOSE, CAPILLARY
GLUCOSE-CAPILLARY: 89 mg/dL (ref 65–99)
GLUCOSE-CAPILLARY: 102 mg/dL — AB (ref 65–99)
GLUCOSE-CAPILLARY: 99 mg/dL (ref 65–99)
GLUCOSE-CAPILLARY: 102 mg/dL — AB (ref 65–99)
Glucose-Capillary: 101 mg/dL — ABNORMAL HIGH (ref 65–99)
Glucose-Capillary: 102 mg/dL — ABNORMAL HIGH (ref 65–99)
Glucose-Capillary: 106 mg/dL — ABNORMAL HIGH (ref 65–99)
Glucose-Capillary: 107 mg/dL — ABNORMAL HIGH (ref 65–99)
Glucose-Capillary: 111 mg/dL — ABNORMAL HIGH (ref 65–99)
Glucose-Capillary: 86 mg/dL (ref 65–99)

## 2016-02-26 LAB — CBC
HCT: 26 % — ABNORMAL LOW (ref 36.0–46.0)
HEMATOCRIT: 28.1 % — AB (ref 36.0–46.0)
HEMOGLOBIN: 8.6 g/dL — AB (ref 12.0–15.0)
HEMOGLOBIN: 9.2 g/dL — AB (ref 12.0–15.0)
MCH: 27.7 pg (ref 26.0–34.0)
MCH: 27.5 pg (ref 26.0–34.0)
MCHC: 33.1 g/dL (ref 30.0–36.0)
MCHC: 32.7 g/dL (ref 30.0–36.0)
MCV: 83.9 fL (ref 78.0–100.0)
MCV: 84.1 fL (ref 78.0–100.0)
Platelets: 306 10*3/uL (ref 150–400)
Platelets: 300 10*3/uL (ref 150–400)
RBC: 3.1 MIL/uL — AB (ref 3.87–5.11)
RBC: 3.34 MIL/uL — ABNORMAL LOW (ref 3.87–5.11)
RDW: 18.1 % — ABNORMAL HIGH (ref 11.5–15.5)
RDW: 18.4 % — ABNORMAL HIGH (ref 11.5–15.5)
WBC: 18.8 10*3/uL — ABNORMAL HIGH (ref 4.0–10.5)
WBC: 21.7 10*3/uL — ABNORMAL HIGH (ref 4.0–10.5)

## 2016-02-26 LAB — TYPE AND SCREEN
BLOOD PRODUCT EXPIRATION DATE: 201801012359
Blood Product Expiration Date: 201712312359
Blood Product Expiration Date: 201801012359
Blood Product Expiration Date: 201801012359
ISSUE DATE / TIME: 201712300748
ISSUE DATE / TIME: 201712300748
ISSUE DATE / TIME: 201712310840
ISSUE DATE / TIME: 201712310844
UNIT TYPE AND RH: 600
UNIT TYPE AND RH: 6200
UNIT TYPE AND RH: 6200
Unit Type and Rh: 6200

## 2016-02-26 LAB — BASIC METABOLIC PANEL
Anion gap: 10 (ref 5–15)
BUN: 13 mg/dL (ref 6–20)
CHLORIDE: 102 mmol/L (ref 101–111)
CO2: 21 mmol/L — ABNORMAL LOW (ref 22–32)
CREATININE: 4.36 mg/dL — AB (ref 0.44–1.00)
Calcium: 9.9 mg/dL (ref 8.9–10.3)
GFR, EST AFRICAN AMERICAN: 12 mL/min — AB (ref >60)
GFR, EST NON AFRICAN AMERICAN: 10 mL/min — AB (ref >60)
Glucose, Bld: 102 mg/dL — ABNORMAL HIGH (ref 65–99)
POTASSIUM: 4.5 mmol/L (ref 3.5–5.1)
SODIUM: 133 mmol/L — AB (ref 135–145)

## 2016-02-26 LAB — VAS US DOPPLER PRE CABG
LCCADSYS: -84 cm/s
LEFT ECA DIAS: -3 cm/s
LEFT VERTEBRAL DIAS: 11 cm/s
Left CCA dist dias: -3 cm/s
Left CCA prox dias: 9 cm/s
Left CCA prox sys: 152 cm/s
Left ICA dist dias: -10 cm/s
Left ICA dist sys: -133 cm/s
Left ICA prox dias: -13 cm/s
Left ICA prox sys: -135 cm/s
RCCADSYS: -93 cm/s
RCCAPDIAS: 4 cm/s
RCCAPSYS: 103 cm/s
RIGHT ECA DIAS: 9 cm/s
RIGHT VERTEBRAL DIAS: 11 cm/s

## 2016-02-26 LAB — POCT I-STAT, CHEM 8
BUN: <3 mg/dL — ABNORMAL LOW (ref 6–20)
Calcium, Ion: 1.24 mmol/L (ref 1.15–1.40)
Chloride: 108 mmol/L (ref 101–111)
Creatinine, Ser: 0.3 mg/dL — ABNORMAL LOW (ref 0.44–1.00)
Glucose, Bld: 111 mg/dL — ABNORMAL HIGH (ref 65–99)
HCT: 31 % — ABNORMAL LOW (ref 36.0–46.0)
Hemoglobin: 10.5 g/dL — ABNORMAL LOW (ref 12.0–15.0)
Potassium: 2.4 mmol/L — CL (ref 3.5–5.1)
Sodium: 151 mmol/L — ABNORMAL HIGH (ref 135–145)
TCO2: 32 mmol/L (ref 0–100)

## 2016-02-26 LAB — CREATININE, SERUM
Creatinine, Ser: 2.42 mg/dL — ABNORMAL HIGH (ref 0.44–1.00)
GFR calc Af Amer: 24 mL/min — ABNORMAL LOW (ref >60)
GFR calc non Af Amer: 20 mL/min — ABNORMAL LOW (ref >60)

## 2016-02-26 LAB — MAGNESIUM
MAGNESIUM: 2.2 mg/dL (ref 1.7–2.4)
Magnesium: 1.9 mg/dL (ref 1.7–2.4)

## 2016-02-26 MED ORDER — LIDOCAINE-PRILOCAINE 2.5-2.5 % EX CREA
1.0000 "application " | TOPICAL_CREAM | CUTANEOUS | Status: DC | PRN
  Filled 2016-02-26: qty 5

## 2016-02-26 MED ORDER — ALTEPLASE 2 MG IJ SOLR: 2.0000 mg | Freq: Once | INTRAMUSCULAR | Status: DC | PRN

## 2016-02-26 MED ORDER — ORAL CARE MOUTH RINSE
15.0000 mL | Freq: Two times a day (BID) | OROMUCOSAL | Status: DC
  Administered 2016-02-26 – 2016-03-03 (×8): 15 mL via OROMUCOSAL

## 2016-02-26 MED ORDER — INSULIN ASPART 100 UNIT/ML ~~LOC~~ SOLN
0.0000 [IU] | SUBCUTANEOUS | Status: DC
Start: 2016-02-26 — End: 2016-02-26

## 2016-02-26 MED ORDER — INSULIN ASPART 100 UNIT/ML ~~LOC~~ SOLN: 0.0000 [IU] | SUBCUTANEOUS | Status: DC

## 2016-02-26 MED ORDER — HEPARIN SODIUM (PORCINE) 1000 UNIT/ML DIALYSIS: 1000.0000 [IU] | INTRAMUSCULAR | Status: DC | PRN

## 2016-02-26 MED ORDER — LIDOCAINE HCL (PF) 1 % IJ SOLN: 5.0000 mL | INTRAMUSCULAR | Status: DC | PRN

## 2016-02-26 MED ORDER — SODIUM CHLORIDE 0.9 % IV SOLN
30.0000 meq | Freq: Once | INTRAVENOUS | Status: AC
  Administered 2016-02-26: 30 meq via INTRAVENOUS
  Filled 2016-02-26: qty 15

## 2016-02-26 MED ORDER — ENOXAPARIN SODIUM 30 MG/0.3ML ~~LOC~~ SOLN
30.0000 mg | Freq: Every day | SUBCUTANEOUS | Status: DC
  Administered 2016-02-26 – 2016-03-05 (×9): 30 mg via SUBCUTANEOUS
  Filled 2016-02-26 (×9): qty 0.3

## 2016-02-26 MED ORDER — SODIUM CHLORIDE 0.9 % IV SOLN: 100.0000 mL | INTRAVENOUS | Status: DC | PRN

## 2016-02-26 MED ORDER — PENTAFLUOROPROP-TETRAFLUOROETH EX AERO: 1.0000 "application " | INHALATION_SPRAY | CUTANEOUS | Status: DC | PRN

## 2016-02-26 NOTE — Progress Notes (Signed)
Interim Progress Note  Patient remains under care of CCM.  Family Medicine following.  Appreciate excellent care being provided and FM will resume care once she is stable for transfer to floor.    Lovenia Kim, MD 02/26/2016, 8:34 AM PGY-1

## 2016-02-26 NOTE — Plan of Care (Signed)
Problem: Activity: Goal: Risk for activity intolerance will decrease Outcome: Progressing Pt dangled several times today  Problem: Cardiac: Goal: Hemodynamic stability will improve Outcome: Progressing Pacer on backup mode, titrating BP meds  Problem: Respiratory: Goal: Levels of oxygenation will improve Outcome: Progressing Oxygen weaned down

## 2016-02-26 NOTE — Progress Notes (Signed)
Dialysis treatment completed.  2000 mL ultrafiltrated.  1500 mL net fluid removal.  Patient status unchanged. Lung sounds diminished to ausculation in all fields. Moderate edema. Cardiac: NSR.  Cleansed LIJ catheter with chlorhexidine.  Disconnected lines and flushed ports with saline per protocol.  Ports locked with heparin and capped per protocol.    Report given to bedside, RN Anderson Malta.

## 2016-02-26 NOTE — Progress Notes (Signed)
1 Day Post-Op Procedure(s) (LRB): AORTIC VALVE REPLACEMENT (AVR) implanted with Magna Ease Aortic valve size 20mm (N/A) TRANSESOPHAGEAL ECHOCARDIOGRAM (TEE) (N/A) Subjective: Some incisional pain  Objective: Vital signs in last 24 hours: Temp:  [95.7 F (35.4 C)-98.1 F (36.7 C)] 98.1 F (36.7 C) (12/31 0900) Pulse Rate:  [86-148] 88 (12/31 0900) Cardiac Rhythm: Atrial paced (12/30 2000) Resp:  [8-40] 20 (12/31 0900) BP: (92-131)/(49-72) 110/69 (12/31 0900) SpO2:  [98 %-100 %] 100 % (12/31 0900) Arterial Line BP: (92-168)/(46-94) 92/71 (12/31 0900) FiO2 (%):  [40 %-50 %] 40 % (12/30 2010) Weight:  [228 lb 9.9 oz (103.7 kg)-235 lb 0.2 oz (106.6 kg)] 228 lb 9.9 oz (103.7 kg) (12/31 0530)  Hemodynamic parameters for last 24 hours: PAP: (26-50)/(8-24) 35/18 CO:  [3.4 L/min-4.5 L/min] 4.5 L/min CI:  [1.7 L/min/m2-2.3 L/min/m2] 2.3 L/min/m2  Intake/Output from previous day: 12/30 0701 - 12/31 0700 In: 3198.7 [P.O.:200; I.V.:1768.7; Blood:270; NG/GT:60; IV Piggyback:900] Out: 690 [Blood:450; Chest Tube:240] Intake/Output this shift: Total I/O In: -  Out: 30 [Chest Tube:30]  General appearance: cooperative and no distress Neurologic: intact Heart: regular rate and rhythm Lungs: diminished breath sounds bibasilar Abdomen: soft, NT, hypoactive BS  Lab Results:  Recent Labs  02/25/16 1840 02/26/16 0400  WBC 22.0* 18.8*  HGB 9.1* 8.6*  HCT 27.9* 26.0*  PLT 304 306   BMET:  Recent Labs  02/25/16 0236  02/25/16 1837 02/25/16 1840 02/26/16 0400  NA 133*  < > 136  --  133*  K 4.0  < > 4.1  --  4.5  CL 98*  < > 103  --  102  CO2 26  --   --   --  21*  GLUCOSE 97  < > 122*  --  102*  BUN 8  < > 12  --  13  CREATININE 3.55*  < > 3.90* 4.02* 4.36*  CALCIUM 9.4  --   --   --  9.9  < > = values in this interval not displayed.  PT/INR:  Recent Labs  02/25/16 1300  LABPROT 23.5*  INR 2.05   ABG    Component Value Date/Time   PHART 7.430 02/25/2016 2142   HCO3  21.1 02/25/2016 2142   TCO2 22 02/25/2016 2142   ACIDBASEDEF 3.0 (H) 02/25/2016 2142   O2SAT 97.0 02/25/2016 2142   CBG (last 3)   Recent Labs  02/26/16 0303 02/26/16 0355 02/26/16 0801  GLUCAP 106* 102* 101*    Assessment/Plan: S/P Procedure(s) (LRB): AORTIC VALVE REPLACEMENT (AVR) implanted with Magna Ease Aortic valve size 67mm (N/A) TRANSESOPHAGEAL ECHOCARDIOGRAM (TEE) (N/A) -POD # 1 AVR, bioprosthetic valve  CV- in SR, Still on levophed, wean as tolerated  Will not need coumadin unless an alternate indication arises  RESP- bibasilar atelectasis- IS  RENAL- ESRD for HD today  ENDO- CBG well controlled, CBG/SSI Q$  Anemia- acute secondary to ABL on chronic- stable, follow  SCD + enoxaparin for DVT prophylaxis  DC chest tubes, A line, swan  Mobilize   LOS: 11 days    Melrose Nakayama 02/26/2016

## 2016-02-26 NOTE — Progress Notes (Signed)
Arrived to patient room 2S-05 at 1430.  Reviewed treatment plan and this RN agrees with plan.  Report received from bedside RN, Anderson Malta.  Consent vefified.  Patient A & O X 4.   Lung sounds clear to ausculation in all fields. Moderate edema. Cardiac:  NSR, some ectopy.  Removed caps and cleansed LIJ catheter with chlorhedxidine.  Aspirated ports of heparin and flushed them with saline per protocol.  Connected and secured lines, initiated treatment at 1445.  UF Goal of 2530mL and net fluid removal 2L.  Will continue to monitor.

## 2016-02-26 NOTE — Progress Notes (Signed)
Potassium of 2.4 reported per bedside RN.  Nephrology notified, acid bath adjusted.  Will continue to monitor.

## 2016-02-26 NOTE — Progress Notes (Signed)
[facility] Progress Note   Subjective:  Extubated , stable, up 6kg postop, CXR no change mild perihilar edema  Objective Vitals:   02/26/16 0600 02/26/16 0700 02/26/16 0800 02/26/16 0900  BP: (!) 102/53 (!) 114/53 (!) 104/57 110/69  Pulse: 89 90 91 88  Resp: 18 (!) 21 (!) 24 20  Temp: 97.9 F (36.6 C) 97.9 F (36.6 C) 98.1 F (36.7 C) 98.1 F (36.7 C)  TempSrc:      SpO2: 99% 100% 99% 100%  Weight:      Height:       Physical Exam Gen: extubated, stable Heart: RRR; 2/6 systolic murmur Lungs: CTAB Abdomen: soft, non-tender Extremities: No LE edema. RUE with intact sutures, no drainage. Dialysis Access: TDC in L chest; no tenderness/erythema.  Dialysis Orders: MWF at [facility]  4h 99.5kg 2/2 bath RUE AVG (now removed)/ new L IJ cath Hep 6000 w 2500 midrun - Mircera 261mg IV q 2 weeks (last given 12/13) - Calcitriol 0.546m PO q HD  Assessment: 1. 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 Vol excess: up 6kg post op w mild pulm edema  3. Staph lugdenensis bacteremia w AVG infection: R AVG resection 12/22, IV Ancef 6 weeks 4. ESRD: MWF HD 5. Hypotension: chronic on midodrine 6. Anemia of CKD/ABL: recent vaginal/ AVG bleeding. Hgb 8's and stable. s/p 4U prbc's since admission. Tsat low, but holding off on IV iron in setting of bacteremia. Aranesp 10069mresumed 12/27, increase to 200m13meekly for 1/3. 7. Secondary hyperparathyroidism:Ca ok. Continue binders/VDRA/sensipar. 8. Endometrial hyperplasia: Megace on hold  Plan - HD today and tomorrow , get vol down gradually   [facility] pager [phone]/29/2017, 11:48 AM  Additional Objective Labs: Basic Metabolic Panel:  Recent Labs Lab 02/22/16 0806 02/23/16 0537 02/24/16 0653 02/25/16 0236  02/25/16 1136 02/25/16 1300 02/25/16 1837 02/25/16 1840 02/26/16 0400  NA 133* 132* 133* 133*  < > 136 134* 136  --  133*  K 4.2 3.3* 3.5 4.0  < > 4.2 4.0  4.1  --  4.5  CL 95* 97* 96* 98*  < > 101  --  103  --  102  CO2 29 29 28 26   --   --   --   --   --  21*  GLUCOSE 92 86 97 97  < > 134* 156* 122*  --  102*  BUN 26* 11 16 8   < > 11  --  12  --  13  CREATININE 7.08* 4.16* 6.07* 3.55*  < > 3.90*  --  3.90* 4.02* 4.36*  CALCIUM 9.6 8.8* 9.8 9.4  --   --   --   --   --  9.9  PHOS 4.4 3.4 4.3  --   --   --   --   --   --   --   < > = values in this interval not displayed. Liver Function Tests:  Recent Labs Lab 02/22/16 0806 02/23/16 0537 02/24/16 0653  ALBUMIN 2.0* 1.9* 2.0*   CBC:  Recent Labs Lab 02/24/16 0654 02/25/16 0236  02/25/16 1300 02/25/16 1837 02/25/16 1840 02/26/16 0400  WBC 12.6* 13.3*  --  27.1*  --  22.0* 18.8*  HGB 8.6* 8.3*  < > 10.2*  9.0* 9.5* 9.1* 8.6*  HCT 27.3* 26.0*  < > 30.0*  27.2* 28.0* 27.9* 26.0*  MCV 86.9 85.2  --  85.5  --  85.3 83.9  PLT 420* 487*  < > 257  --  304 306  < > = values in this interval not displayed. Blood Culture    Component Value Date/Time   SDES TISSUE 02/25/2016 1000   SPECREQUEST AORTIC VALVE LEAFLET POF ZINACEF AND VANC 02/25/2016 1000   CULT PENDING 02/25/2016 1000   REPTSTATUS PENDING 02/25/2016 1000   Medications: . sodium chloride 10 mL/hr at 02/26/16 0400  . sodium chloride    . sodium chloride 1 mL/hr at 02/26/16 0400  . sodium chloride 20 mL/hr at 02/26/16 0400  . dexmedetomidine Stopped (02/25/16 1425)  . lactated ringers    . lactated ringers    . nitroGLYCERIN Stopped (02/25/16 1239)  . norepinephrine (LEVOPHED) Adult infusion 7 mcg/min (02/26/16 0400)  . phenylephrine (NEO-SYNEPHRINE) Adult infusion Stopped (02/25/16 1239)   . acetaminophen  1,000 mg Oral Q6H   Or  . acetaminophen (TYLENOL) oral liquid 160 mg/5 mL  1,000 mg Per Tube Q6H  . aspirin EC  325 mg Oral Daily   Or  . aspirin  324 mg Per Tube Daily  . bisacodyl  10 mg Oral Daily   Or  . bisacodyl  10 mg Rectal Daily  .  ceFAZolin (ANCEF) IV  2 g Intravenous Q M,W,F-1800  .  chlorhexidine gluconate (MEDLINE KIT)  15 mL Mouth Rinse BID  . cinacalcet  60 mg Oral QPM  . [START ON 02/29/2016] darbepoetin (ARANESP) injection - DIALYSIS  200 mcg Intravenous Q Wed-HD  . docusate sodium  200 mg Oral Daily  . enoxaparin (LOVENOX) injection  30 mg Subcutaneous QHS  . famotidine (PEPCID) IV  20 mg Intravenous Q24H  . insulin aspart  0-24 Units Subcutaneous Q4H  . magnesium sulfate  4 g Intravenous Once  . mouth rinse  15 mL Mouth Rinse QID  . metoprolol tartrate  12.5 mg Oral BID   Or  . metoprolol tartrate  12.5 mg Per Tube BID  . [START ON 02/27/2016] pantoprazole  40 mg Oral Daily  . potassium chloride (KCL MULTIRUN) 30 mEq in 265 mL IVPB  30 mEq Intravenous Once  . rifampin  300 mg Oral Q12H  . sevelamer carbonate  1,600 mg Oral TID WC  . sodium chloride flush  3 mL Intravenous Q12H

## 2016-02-26 NOTE — Op Note (Signed)
NAME:  Marks, [name]               ACCOUNT NO.:  [id number]  MEDICAL RECORD NO.:  [id number]  LOCATION:                                 FACILITY:  PHYSICIAN:  Revonda Standard. 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  PROCEDURE:   Median sternotomy, extracorporeal circulation, Aortic valve replacement with 21 mm [facility] Ease bovine pericardial valve(Model #3300 TFX, serial [id number])  SURGEON:  Modesto Charon, M.D.  ASSISTANT:  John Giovanni, P.A.-C.  ANESTHESIA:  General.  PREBYPASS FINDINGS:  Transesophageal echocardiography revealed severe aortic insufficiency with vegetations on the right and non-coronary cusps. There was moderate mitral regurgitation and moderate tricuspid regurgitation. No vegetations on mitral or tricuspid valves. There was preserved left ventricular function.  INTRAOPERATIVE FINDINGS:  Large vegetations on the right and non- coronary cusps.  The right cusp was essentially destroyed by vegetation. Aortic valve was tricuspid with no annular calcification.    POST BYPASS FINDINGS: Transesophageal echocardiography revealed good function of the prosthetic valve with a gradient of 12-14 mmHg.  No perivalvular leaks. No change in the tricuspid regurgitation, improvement of the mitral regurgitation.  CLINICAL NOTE:  Ms. Marks is a 60 year old woman with end-stage renal disease, who is on hemodialysis.  She presented with altered mental status. During evaluation, she was found to have an infected Gore-Tex graft in her arm that was removed.  She also was found to have endocarditis by transthoracic echo.  A transesophageal echocardiogram was done, which showed severe aortic insufficiency with large  vegetations and prolapse of the right and non-coronary cusps.  Due to the severity of the aortic insufficiency, the patient was advised to undergo aortic valve replacement.  Her blood cultures had cleared with antibiotics and she was felt stable and ready to proceed with surgery before developing decompensated heart failure.  The indications, risks, benefits, and alternatives were discussed in detail with the patient.  She accepted the risks and agreed to proceed.  The relative advantages and disadvantages of mechanical versus tissue valves were discussed in detail with the patient.  She strongly wished to have a tissue valve placed to avoid the need for lifelong anticoagulation, with the understanding she was at risk for early valve deterioration in the setting of renal failure.  OPERATIVE NOTE:  Ms. Marks was brought to the preoperative holding area on February 25, 2016.  Anesthesia was unable to place an arterial line in the preop holding area.  She was taken to the operating room, anesthetized, and intubated.  A left radial A-line then was placed in the operating room.  Dr. Oleta Mouse of Anesthesia placed a Gordy Councilman catheter.  Transesophageal echocardiography was performed. Findings as previously noted.  There was severe aortic insufficiency. There was moderate mitral and tricuspid insufficiency but no evidence of vegetations on those valves.  The chest, abdomen, and legs were prepped and draped in usual sterile fashion.  The patient was already receiving intravenous Ancef on a scheduled basis.  She was given  vancomycin in addition.  After performing a time-out, a median sternotomy was performed.  A sternal retractor was placed.  The patient was fully heparinized.  The pericardium was opened.  There was a small pericardial effusion.  There was marked cardiomegaly.  The aorta was relatively small in caliber and length.  After confirming adequate anticoagulation with ACT  measurement, the aorta was cannulated via concentric 2-0 Ethibond pledgeted pursestring sutures.  A dual stage venous cannula was placed via a pursestring suture in the right atrial appendage. Cardiopulmonary bypass was initiated.  Flows were maintained per protocol.  The patient was cooled to 32 degrees Celsius.  A left ventricular vent was placed via a pursestring suture in the right superior pulmonary vein and a retrograde cardioplegia cannula was placed via a pursestring suture in the right atrium and directed into the coronary sinus.  A vent was placed in the ascending aorta.  The aorta was crossclamped.  Cardiac arrest was achieved with cold retrograde cardioplegia and topical iced saline.  There was a rapid diastolic arrest and septal cooling to 10 degrees Celsius with 500 mL of cardioplegia and a total of one liter of cardioplegia was administered.  An aortotomy was made and extended into the non-coronary sinus of Valsalva.  The valve was inspected.  The right coronary leaflet had a large vegetation and a perforation.  This leaflet was nearly completely destroyed.  The non-coronary cusp also had a large vegetation.  There was smaller vegetation present on the left coronary cusp.  The valve leaflets were excised.  There was no significant annular calcification.  The annulus sized for a 21 mm Magna Ease pericardial valve.  Additional cardioplegia was administered at 20- minute intervals during the procedure.  2-0 Ethibond horizontal mattress sutures with subannular pledgets were placed circumferentially around the annulus.  Thirteen sutures were utilized.  The valve was prepared per manufacturer's recommendations. The sutures then were placed through the sewing ring of the valve which was then lowered into place and the sutures were sequentially tied, periodically inspecting to ensure that the sewing ring of the valve was well seated on the anulus.  After all the sutures had been  tied, the annulus was probed with a fine tip right-angle and no gaps were noted. The coronary ostia were inspected and there was no impingement. Rewarming was begun.  The aortotomy was closed in 2 layers.  The first layer was a running 4-0 Prolene horizontal mattress suture.  At the completion of the first layer of the closure, the patient was placed in steep Trendelenburg position.  Deairing was performed before tying the suture.  It should be noted that carbon dioxide was insufflated into the operative field beginning at the initiation of cardiopulmonary bypass and stopping after the aortic closure was complete.  The second layer of the aortic closure then was performed with a running 4-0 Prolene suture. The patient remained in Trendelenburg position.  A warm dose of retrograde cardioplegia was administered.  Additional deairing maneuvers were performed.  Lidocaine was administered.  The aortic crossclamp was removed. The crossclamp time was 64 minutes.  The patient spontaneously resumed sinus rhythm and did not require defibrillation.  While rewarming was completed, epicardial pacing wires were placed on the right ventricle and right atrium. The left ventricular vent and retrograde cardioplegia cannula were removed.  When the patient had rewarmed to a core temperature of 37 degrees Celsius in preparation for coming off bypass, there was air noted in the left ventricle on transesophageal  echocardiogram.  An 18-gauge Angiocath was placed in the apex to deair the left ventricle.  The patient then was weaned from cardiopulmonary bypass on the first attempt without difficulty.  The total bypass time was 102 minutes.  The initial cardiac index was greater than 2 L/min/m2.  Post bypass transesophageal echocardiography showed good function of the prosthetic valve with no perivalvular leaks. The gradient was 12-14 mmHg.  The mitral regurgitation was improved. The tricuspid regurgitation was  unchanged.  Left ventricular wall motion was preserved.  A test dose of protamine was administered and was well tolerated.  The atrial and aortic cannulae were removed.  The remainder of the protamine was administered without incident.  The chest irrigated with warm saline.  The pericardium was reapproximated with interrupted 3- 0 silk sutures.  A 28-French Blake drain and 36-French chest tube were placed into the mediastinum and secured to the skin with #1 silk sutures. The sternum was closed with a combination of single and double heavy gauge stainless steel wires.  The pectoralis fascia, subcutaneous tissue, and skin were closed in standard fashion.  All sponge, needle, and instrument counts were correct at the end of the procedure. The patient was taken from the operating room to the surgical intensive care unit in good condition.     Revonda Standard Roxan Hockey, M.D.     SCH/MEDQ  D:  02/25/2016  T:  02/26/2016  Job:  [id number]

## 2016-02-27 ENCOUNTER — Inpatient Hospital Stay (HOSPITAL_COMMUNITY): Payer: Medicare Other

## 2016-02-27 LAB — BASIC METABOLIC PANEL
ANION GAP: 8 (ref 5–15)
BUN: 9 mg/dL (ref 6–20)
CALCIUM: 9.6 mg/dL (ref 8.9–10.3)
CO2: 26 mmol/L (ref 22–32)
Chloride: 99 mmol/L — ABNORMAL LOW (ref 101–111)
Creatinine, Ser: 3.48 mg/dL — ABNORMAL HIGH (ref 0.44–1.00)
GFR calc Af Amer: 15 mL/min — ABNORMAL LOW (ref >60)
GFR, EST NON AFRICAN AMERICAN: 13 mL/min — AB (ref >60)
GLUCOSE: 116 mg/dL — AB (ref 65–99)
Potassium: 4.1 mmol/L (ref 3.5–5.1)
Sodium: 133 mmol/L — ABNORMAL LOW (ref 135–145)

## 2016-02-27 LAB — GLUCOSE, CAPILLARY
GLUCOSE-CAPILLARY: 97 mg/dL (ref 65–99)
GLUCOSE-CAPILLARY: 110 mg/dL — AB (ref 65–99)
GLUCOSE-CAPILLARY: 80 mg/dL (ref 65–99)
Glucose-Capillary: 98 mg/dL (ref 65–99)
Glucose-Capillary: 98 mg/dL (ref 65–99)

## 2016-02-27 LAB — CBC
HCT: 24.5 % — ABNORMAL LOW (ref 36.0–46.0)
HEMOGLOBIN: 8.2 g/dL — AB (ref 12.0–15.0)
MCH: 28.3 pg (ref 26.0–34.0)
MCHC: 33.5 g/dL (ref 30.0–36.0)
MCV: 84.5 fL (ref 78.0–100.0)
PLATELETS: 296 10*3/uL (ref 150–400)
RBC: 2.9 MIL/uL — ABNORMAL LOW (ref 3.87–5.11)
RDW: 18.8 % — ABNORMAL HIGH (ref 11.5–15.5)
WBC: 19.8 10*3/uL — ABNORMAL HIGH (ref 4.0–10.5)

## 2016-02-27 MED ORDER — SODIUM CHLORIDE 0.9 % IV SOLN: 100.0000 mL | INTRAVENOUS | Status: DC | PRN

## 2016-02-27 MED ORDER — LIDOCAINE-PRILOCAINE 2.5-2.5 % EX CREA: 1.0000 "application " | TOPICAL_CREAM | CUTANEOUS | Status: DC | PRN

## 2016-02-27 MED ORDER — HEPARIN SODIUM (PORCINE) 1000 UNIT/ML DIALYSIS
1000.0000 [IU] | INTRAMUSCULAR | Status: DC | PRN
Start: 2016-02-27 — End: 2016-02-27

## 2016-02-27 MED ORDER — MIDODRINE HCL 5 MG PO TABS
10.0000 mg | ORAL_TABLET | ORAL | Status: DC
  Administered 2016-02-27: 10 mg via ORAL
  Filled 2016-02-27: qty 2

## 2016-02-27 MED ORDER — INSULIN ASPART 100 UNIT/ML ~~LOC~~ SOLN: 0.0000 [IU] | Freq: Three times a day (TID) | SUBCUTANEOUS | Status: DC

## 2016-02-27 MED ORDER — ALTEPLASE 2 MG IJ SOLR: 2.0000 mg | Freq: Once | INTRAMUSCULAR | Status: DC | PRN

## 2016-02-27 MED ORDER — LIDOCAINE HCL (PF) 1 % IJ SOLN: 5.0000 mL | INTRAMUSCULAR | Status: DC | PRN

## 2016-02-27 MED ORDER — PENTAFLUOROPROP-TETRAFLUOROETH EX AERO: 1.0000 "application " | INHALATION_SPRAY | CUTANEOUS | Status: DC | PRN

## 2016-02-27 NOTE — Progress Notes (Signed)
2 Days Post-Op Procedure(s) (LRB): AORTIC VALVE REPLACEMENT (AVR) implanted with Magna Ease Aortic valve size 73mm (N/A) TRANSESOPHAGEAL ECHOCARDIOGRAM (TEE) (N/A) Subjective: Some incisional pain when examining patient, o/w denies pain  Objective: Vital signs in last 24 hours: Temp:  [97.6 F (36.4 C)-98.6 F (37 C)] 97.6 F (36.4 C) (01/01 0800) Pulse Rate:  [62-95] 90 (01/01 0845) Cardiac Rhythm: Normal sinus rhythm (01/01 0800) Resp:  [0-29] 25 (01/01 0800) BP: (78-128)/(39-91) 112/57 (01/01 0845) SpO2:  [87 %-100 %] 96 % (01/01 0800) Arterial Line BP: (81-85)/(69-73) 81/69 (12/31 1200) Weight:  [225 lb 5 oz (102.2 kg)-228 lb 9.9 oz (103.7 kg)] 227 lb 8.2 oz (103.2 kg) (01/01 0715)  Hemodynamic parameters for last 24 hours: PAP: (36-37)/(19-20) 37/20  Intake/Output from previous day: 12/31 0701 - 01/01 0700 In: 840.2 [P.O.:150; I.V.:425.2; IV Piggyback:265] Out: 1530 [Chest Tube:30] Intake/Output this shift: No intake/output data recorded.  General appearance: alert, cooperative and no distress Neurologic: intact Heart: regular rate and rhythm Lungs: diminished breath sounds bibasilar Abdomen: normal findings: soft, non-tender  Lab Results:  Recent Labs  02/26/16 1630 02/26/16 1707 02/27/16 0500  WBC 21.7*  --  19.8*  HGB 9.2* 10.5* 8.2*  HCT 28.1* 31.0* 24.5*  PLT 300  --  296   BMET:  Recent Labs  02/26/16 0400  02/26/16 1707 02/27/16 0500  NA 133*  --  151* 133*  K 4.5  --  2.4* 4.1  CL 102  --  108 99*  CO2 21*  --   --  26  GLUCOSE 102*  --  111* 116*  BUN 13  --  <3* 9  CREATININE 4.36*  < > 0.30* 3.48*  CALCIUM 9.9  --   --  9.6  < > = values in this interval not displayed.  PT/INR:  Recent Labs  02/25/16 1300  LABPROT 23.5*  INR 2.05   ABG    Component Value Date/Time   PHART 7.430 02/25/2016 2142   HCO3 21.1 02/25/2016 2142   TCO2 32 02/26/2016 1707   ACIDBASEDEF 3.0 (H) 02/25/2016 2142   O2SAT 97.0 02/25/2016 2142   CBG  (last 3)   Recent Labs  02/26/16 2336 02/27/16 0355 02/27/16 0752  GLUCAP 111* 97 110*    Assessment/Plan: S/P Procedure(s) (LRB): AORTIC VALVE REPLACEMENT (AVR) implanted with Magna Ease Aortic valve size 47mm (N/A) TRANSESOPHAGEAL ECHOCARDIOGRAM (TEE) (N/A) -  CV- ECg shows some diffuse mild ST elevation- likely pericarditis. 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

## 2016-02-27 NOTE — Progress Notes (Signed)
Pharmacy Antibiotic Note  [name] Marks is a 61 y.o. female admitted on 02/15/2016 with bacteremia and AV fistula graft infection. Pt is afebrile but WBC remains elevated at 19.8. She continues on rifampin and cefazolin, planning for 6 weeks of therapy.   Plan: Continue Cefazolin 2g IV qHD  Monitor clinical status, HD tolerance, changes in HD schedule  Height: 5\' 1"  (154.9 cm) Weight: 226 lb 13.7 oz (102.9 kg) IBW/kg (Calculated) : 47.8  Temp (24hrs), Avg:98.1 F (36.7 C), Min:97.6 F (36.4 C), Max:98.6 F (37 C)   Recent Labs Lab 02/25/16 1300  02/25/16 1840 02/26/16 0400 02/26/16 1630 02/26/16 1707 02/27/16 0500  WBC 27.1*  --  22.0* 18.8* 21.7*  --  19.8*  CREATININE  --   < > 4.02* 4.36* 2.42* 0.30* 3.48*  < > = values in this interval not displayed.  Estimated Creatinine Clearance: 18.7 mL/min (by C-G formula based on SCr of 3.48 mg/dL (H)).    Allergies  Allergen Reactions  . Contrast Media [Iodinated Diagnostic Agents] Anaphylaxis  . Naproxen Sodium Itching   Antimicrobials this admission:  Vanc 12/21 >>12/22 Cefazolin 12/21>>(2/3)    *missed dose on 12/22 Rifampin 12/23>>  Dose adjustments this admission: 12/23- adjusted Ancef dose/schedule d/t missed dose and off-schedule HD  Microbiology results:  12/20 BCx: staph lugdenensis 12/21 MRSA PCR: negative  12/22 WCx: staph lugdunensis 12/22 BCx: staph lugdunensis 12/23 BCx: NG F 12/25 BCx: neg 12/30 tissue: GPC/pairs  Thank you for allowing pharmacy to be a part of this patient's care.  Salome Arnt, PharmD, BCPS Pager # [phone] 02/27/2016 8:23 AM

## 2016-02-27 NOTE — Plan of Care (Signed)
Problem: Bowel/Gastric: Goal: Gastrointestinal status for postoperative course will improve Outcome: Progressing Small BM today   Problem: Cardiac: Goal: Hemodynamic stability will improve Outcome: Not Progressing Pt still requiring vasopressor  Problem: Nutritional: Goal: Risk for body nutrition deficit will decrease Outcome: Progressing Pt eating Renal diet, fair appetite  Problem: Respiratory: Goal: Respiratory status will improve Outcome: Progressing Pt is doing C, DB, and IS regularly

## 2016-02-27 NOTE — Progress Notes (Signed)
[facility] Progress Note   Subjective:   Awake and alert Weaning levophed HD yesterday 1.5 liters net off (was up 6 kg post op) For HD again today  Objective Vitals:   02/27/16 0945 02/27/16 1000 02/27/16 1015 02/27/16 1034  BP: (!) 97/54 (!) 110/56 (!) 106/53 (!) 112/58  Pulse: 89 89 90 91  Resp:  (!) 21  (!) 22  Temp:    97.9 F (36.6 C)  TempSrc:    Oral  SpO2:  100%  100%  Weight:    101.6 kg (223 lb 15.8 oz)  Height:       Physical Exam Awake, alert, oriented NAD Sternal dressing intact. L TDC clean and dry. Regular rhythm L4Y5 No S3 2/6 systolic murmur USB Abd obese, not tender Ext's with SCD's in place No LE edema noted Gen: extubated, stable Heart: RRR; 2/6 systolic murmur Lungs: CTAB Abdomen: soft, non-tender Extremities: No LE edema. RUE with intact sutures, no drainage. Dialysis Access: TDC in L chest; no tenderness/erythema.   Additional Objective Labs: Basic Metabolic Panel:  Recent Labs Lab 02/22/16 0806 02/23/16 0537 02/24/16 0653 02/25/16 0236  02/26/16 0400 02/26/16 1630 02/26/16 1707 02/27/16 0500  NA 133* 132* 133* 133*  < > 133*  --  151* 133*  K 4.2 3.3* 3.5 4.0  < > 4.5  --  2.4* 4.1  CL 95* 97* 96* 98*  < > 102  --  108 99*  CO2 29 29 28 26   --  21*  --   --  26  GLUCOSE 92 86 97 97  < > 102*  --  111* 116*  BUN 26* 11 16 8   < > 13  --  <3* 9  CREATININE 7.08* 4.16* 6.07* 3.55*  < > 4.36* 2.42* 0.30* 3.48*  CALCIUM 9.6 8.8* 9.8 9.4  --  9.9  --   --  9.6  PHOS 4.4 3.4 4.3  --   --   --   --   --   --   < > = values in this interval not displayed. Liver Function Tests:  Recent Labs Lab 02/22/16 0806 02/23/16 0537 02/24/16 0653  ALBUMIN 2.0* 1.9* 2.0*   CBC:  Recent Labs Lab 02/25/16 1300  02/25/16 1840 02/26/16 0400 02/26/16 1630 02/26/16 1707 02/27/16 0500  WBC 27.1*  --  22.0* 18.8* 21.7*  --  19.8*  HGB 10.2*  9.0*  < > 9.1* 8.6* 9.2* 10.5* 8.2*  HCT 30.0*  27.2*  < > 27.9* 26.0* 28.1*  31.0* 24.5*  MCV 85.5  --  85.3 83.9 84.1  --  84.5  PLT 257  --  304 306 300  --  296  < > = values in this interval not displayed. Blood Culture    Component Value Date/Time   SDES TISSUE 02/25/2016 1000   SPECREQUEST AORTIC VALVE LEAFLET POF ZINACEF AND VANC 02/25/2016 1000   CULT NO GROWTH 1 DAY 02/25/2016 1000   REPTSTATUS PENDING 02/25/2016 1000   Medications: . sodium chloride    . sodium chloride 10 mL/hr at 02/26/16 2000  . dexmedetomidine Stopped (02/25/16 1425)  . lactated ringers    . nitroGLYCERIN Stopped (02/25/16 1239)  . norepinephrine (LEVOPHED) Adult infusion 7 mcg/min (02/27/16 0600)  . phenylephrine (NEO-SYNEPHRINE) Adult infusion Stopped (02/25/16 1239)   . acetaminophen  1,000 mg Oral Q6H   Or  . acetaminophen (TYLENOL) oral liquid 160 mg/5 mL  1,000 mg Per Tube Q6H  . aspirin EC  325 mg Oral Daily   Or  . aspirin  324 mg Per Tube Daily  . bisacodyl  10 mg Oral Daily   Or  . bisacodyl  10 mg Rectal Daily  .  ceFAZolin (ANCEF) IV  2 g Intravenous Q M,W,F-1800  . cinacalcet  60 mg Oral QPM  . [START ON 02/29/2016] darbepoetin (ARANESP) injection - DIALYSIS  200 mcg Intravenous Q Wed-HD  . docusate sodium  200 mg Oral Daily  . enoxaparin (LOVENOX) injection  30 mg Subcutaneous QHS  . famotidine (PEPCID) IV  20 mg Intravenous Q24H  . insulin aspart  0-9 Units Subcutaneous TID WC  . magnesium sulfate  4 g Intravenous Once  . mouth rinse  15 mL Mouth Rinse BID  . metoprolol tartrate  12.5 mg Oral BID   Or  . metoprolol tartrate  12.5 mg Per Tube BID  . pantoprazole  40 mg Oral Daily  . rifampin  300 mg Oral Q12H  . sevelamer carbonate  1,600 mg Oral TID WC  . sodium chloride flush  3 mL Intravenous Q12H    Dialysis Orders: MWF at [facility]  4h 99.5kg 2/2 bath RUE AVG (now removed)/ new L IJ cath Hep 6000 w 2500 midrun - Mircera 239mcg IV q 2 weeks (last given 12/13) - Calcitriol 0.71mcg PO q HD  Assessment: 1. Aortic valve endocarditis (staph  lugdenensis) - sp AoVR 02/25/16 2. Vol excess: up 6kg post op w mild pulm edema. Had HD yesterday, will HD today on usual schedule. Take volume down gradually.  3. Staph lugdenensis bacteremia w AVG infection: R AVG resection 12/22, last ID note "minimum of 6 weeks ATB's" so plan is for IV Ancef 6 weeks (antistaph ATB's initiated 12/21-has had vanco->ancef)+ rifampin (I presume also for 6 weeks) 4. ESRD: MWF HD. HD again today for volume. EDW 99.5.  Post HD weight 12/31 102.2. K 4.1 will use 4K bath 5. Hypotension: chronic on midodrine 10 pre HD as outpt. Restart. Weaning levophed/off neo 6. Anemia of CKD/ABL: recent vaginal/ AVG bleeding. Hgb 8's and stable. s/p 4U prbc's since admission. Tsat low, but holding off on IV iron in setting of bacteremia. Aranesp 176mcg resumed 12/27, increase to 268mcg weekly for 1/3. 7. Secondary hyperparathyroidism:Ca ok. Continue binders/VDRA/sensipar. 8. Endometrial hyperplasia: Megace on hold  Jamal Maes, MD [facility] [phone] Pager 02/27/2016, 12:19 PM

## 2016-02-27 NOTE — Plan of Care (Signed)
Problem: Cardiac: Goal: Hemodynamic stability will improve Outcome: Progressing Weaning off levophed

## 2016-02-27 NOTE — Progress Notes (Signed)
      [address]       [place] [address]             [phone]      Up in chair  Some incisional pain  BP 118/64   Pulse 93   Temp 98.9 F (37.2 C) (Oral)   Resp (!) 25   Ht 5\' 1"  (1.549 m)   Wt 223 lb 15.8 oz (101.6 kg)   SpO2 100%   BMI 42.32 kg/m    Intake/Output Summary (Last 24 hours) at 02/27/16 1743 Last data filed at 02/27/16 1400  Gross per 24 hour  Intake           668.98 ml  Output             4000 ml  Net         -3331.02 ml   2500 off with HD today  Still on low dose levophed  Remo Lipps C. Roxan Hockey, MD [facility] [phone]

## 2016-02-28 ENCOUNTER — Ambulatory Visit (INDEPENDENT_AMBULATORY_CARE_PROVIDER_SITE_OTHER): Payer: Medicare Other | Admitting: Vascular Surgery

## 2016-02-28 LAB — GLUCOSE, CAPILLARY
GLUCOSE-CAPILLARY: 103 mg/dL — AB (ref 65–99)
GLUCOSE-CAPILLARY: 101 mg/dL — AB (ref 65–99)
Glucose-Capillary: 98 mg/dL (ref 65–99)

## 2016-02-28 LAB — CBC
HCT: 22.8 % — ABNORMAL LOW (ref 36.0–46.0)
HEMOGLOBIN: 7.6 g/dL — AB (ref 12.0–15.0)
MCH: 28.3 pg (ref 26.0–34.0)
MCHC: 33.3 g/dL (ref 30.0–36.0)
MCV: 84.8 fL (ref 78.0–100.0)
Platelets: 278 10*3/uL (ref 150–400)
RBC: 2.69 MIL/uL — AB (ref 3.87–5.11)
RDW: 18.9 % — ABNORMAL HIGH (ref 11.5–15.5)
WBC: 17.7 10*3/uL — ABNORMAL HIGH (ref 4.0–10.5)

## 2016-02-28 LAB — RENAL FUNCTION PANEL
ANION GAP: 11 (ref 5–15)
Albumin: 2.1 g/dL — ABNORMAL LOW (ref 3.5–5.0)
BUN: 8 mg/dL (ref 6–20)
CO2: 26 mmol/L (ref 22–32)
Calcium: 9.3 mg/dL (ref 8.9–10.3)
Chloride: 95 mmol/L — ABNORMAL LOW (ref 101–111)
Creatinine, Ser: 3.31 mg/dL — ABNORMAL HIGH (ref 0.44–1.00)
GFR calc non Af Amer: 14 mL/min — ABNORMAL LOW (ref >60)
GFR, EST AFRICAN AMERICAN: 16 mL/min — AB (ref >60)
GLUCOSE: 93 mg/dL (ref 65–99)
Phosphorus: 3 mg/dL (ref 2.5–4.6)
Potassium: 3.4 mmol/L — ABNORMAL LOW (ref 3.5–5.1)
SODIUM: 132 mmol/L — AB (ref 135–145)

## 2016-02-28 MED ORDER — POTASSIUM CHLORIDE CRYS ER 20 MEQ PO TBCR
20.0000 meq | EXTENDED_RELEASE_TABLET | Freq: Once | ORAL | Status: AC
  Administered 2016-02-28: 20 meq via ORAL
  Filled 2016-02-28: qty 1

## 2016-02-28 MED ORDER — MIDODRINE HCL 5 MG PO TABS: 10.0000 mg | ORAL_TABLET | Freq: Every day | ORAL | Status: DC

## 2016-02-28 MED ORDER — MIDODRINE HCL 5 MG PO TABS
10.0000 mg | ORAL_TABLET | Freq: Three times a day (TID) | ORAL | Status: DC
  Administered 2016-02-28 – 2016-03-05 (×19): 10 mg via ORAL
  Filled 2016-02-28 (×16): qty 2

## 2016-02-28 MED FILL — Sodium Bicarbonate IV Soln 8.4%: INTRAVENOUS | Qty: 50 | Status: AC

## 2016-02-28 MED FILL — Heparin Sodium (Porcine) Inj 1000 Unit/ML: INTRAMUSCULAR | Qty: 30 | Status: AC

## 2016-02-28 MED FILL — Mannitol IV Soln 20%: INTRAVENOUS | Qty: 500 | Status: AC

## 2016-02-28 MED FILL — Potassium Chloride Inj 2 mEq/ML: INTRAVENOUS | Qty: 40 | Status: AC

## 2016-02-28 MED FILL — Lidocaine HCl IV Inj 20 MG/ML: INTRAVENOUS | Qty: 5 | Status: AC

## 2016-02-28 MED FILL — Electrolyte-R (PH 7.4) Solution: INTRAVENOUS | Qty: 1000 | Status: AC

## 2016-02-28 MED FILL — Heparin Sodium (Porcine) Inj 1000 Unit/ML: INTRAMUSCULAR | Qty: 10 | Status: AC

## 2016-02-28 MED FILL — Sodium Chloride IV Soln 0.9%: INTRAVENOUS | Qty: 4000 | Status: AC

## 2016-02-28 MED FILL — Magnesium Sulfate Inj 50%: INTRAMUSCULAR | Qty: 10 | Status: AC

## 2016-02-28 NOTE — Progress Notes (Signed)
Physical Therapy Treatment/ Re-eval Patient Details Name: [name] Marks MRN: [id number] DOB: 1955-11-09 Today's Date: 02/28/2016    History of Present Illness [name] Marks is a 61 y.o. female admitted with AMS, infected RUE AV graft and endocarditis. Pt s/p AVR on 12/31. PMH is significant for ESRD on HD, gout    PT Comments    Pt pleasant but confused regarding memory and safety. Pt educated for sternal precautions, transfers, gait and function. Pt will continue to benefit from acute therapy to maximize mobility and independence adhering to precautions since surgery. Recommend daily mobility with nursing assist.   HR 104-135 with gait BP 107/65 pre 105/63 post sats 97% on RA  Follow Up Recommendations  SNF     Equipment Recommendations       Recommendations for Other Services       Precautions / Restrictions Precautions Precautions: Fall;Sternal    Mobility  Bed Mobility               General bed mobility comments: in chair on arrival  Transfers Overall transfer level: Needs assistance   Transfers: Sit to/from Stand Sit to Stand: Mod assist         General transfer comment: mod assist to stand from chair with cues for hands on thighs, assist for scootind to edge and anterior translation with assist for posterior translation of patella to stand. x 2 trials. Max +2 to scoot back in chair  Ambulation/Gait Ambulation/Gait assistance: Min assist;+2 safety/equipment Ambulation Distance (Feet): 35 Feet Assistive device: Rolling walker (2 wheeled) Gait Pattern/deviations: Step-through pattern;Decreased stride length;Trunk flexed   Gait velocity interpretation: Below normal speed for age/gender General Gait Details: cues for posture, position in RW, breathing technique and safety. Chair to follow with decreased control of descent and 2 ambulation trials of 65' and 30' respectively   Stairs            Wheelchair Mobility    Modified Rankin (Stroke  Patients Only)       Balance Overall balance assessment: Needs assistance   Sitting balance-Leahy Scale: Fair       Standing balance-Leahy Scale: Poor                      Cognition Arousal/Alertness: Awake/alert Behavior During Therapy: WFL for tasks assessed/performed Overall Cognitive Status: Impaired/Different from baseline Area of Impairment: Memory;Safety/judgement     Memory: Decreased short-term memory;Decreased recall of precautions   Safety/Judgement: Decreased awareness of safety     General Comments: pt with difficulty and confusion recalling age of grandchildren, unable to recall events since surgery accurately, unable to recall precautions    Exercises General Exercises - Lower Extremity Long Arc Quad: AROM;Both;Seated;15 reps Hip Flexion/Marching: AROM;Both;Seated;15 reps    General Comments        Pertinent Vitals/Pain Pain Assessment: 0-10 Pain Score: 4  Pain Location: chest Pain Descriptors / Indicators: Aching;Sore Pain Intervention(s): Limited activity within patient's tolerance;Repositioned    Home Living                      Prior Function            PT Goals (current goals can now be found in the care plan section) Acute Rehab PT Goals Patient Stated Goal: get stronger to go home PT Goal Formulation: With patient Time For Goal Achievement: 03/13/16 Potential to Achieve Goals: Good Progress towards PT goals: Goals downgraded-see care plan (goals adjusted to meet change in pt  status)    Frequency           PT Plan Current plan remains appropriate    Co-evaluation             End of Session Equipment Utilized During Treatment: Gait belt Activity Tolerance: Patient tolerated treatment well Patient left: in chair;with call bell/phone within reach;with nursing/sitter in room     Time: 0815-0840 PT Time Calculation (min) (ACUTE ONLY): 25 min  Charges:  $Gait Training: 8-22 mins                    G  Codes:      Adante Courington B Deijah Spikes 03/25/2016, 10:00 AM Elwyn Reach, [place]

## 2016-02-28 NOTE — Plan of Care (Signed)
Problem: Activity: Goal: Risk for activity intolerance will decrease Outcome: Progressing Pt worked with PT today  Problem: Bowel/Gastric: Goal: Gastrointestinal status for postoperative course will improve Outcome: Progressing Small BM 02/27/16  Problem: Cardiac: Goal: Hemodynamic stability will improve Outcome: Progressing Off Levo  Problem: Nutritional: Goal: Risk for body nutrition deficit will decrease Outcome: Progressing On renal diet. Fair appetite   Problem: Respiratory: Goal: Respiratory status will improve Outcome: Progressing On room air

## 2016-02-28 NOTE — Progress Notes (Signed)
3 Days Post-Op Procedure(s) (LRB): AORTIC VALVE REPLACEMENT (AVR) implanted with Magna Ease Aortic valve size 36mm (N/A) TRANSESOPHAGEAL ECHOCARDIOGRAM (TEE) (N/A) Subjective: Incisional pain improved, denies nausea  Objective: Vital signs in last 24 hours: Temp:  [97.9 F (36.6 C)-98.9 F (37.2 C)] 98.7 F (37.1 C) (01/02 0800) Pulse Rate:  [70-100] 96 (01/02 0800) Cardiac Rhythm: Normal sinus rhythm (01/01 2000) Resp:  [14-45] 23 (01/02 0800) BP: (80-125)/(36-74) 97/61 (01/02 0800) SpO2:  [95 %-100 %] 99 % (01/02 0800) Weight:  [222 lb 0.1 oz (100.7 kg)-223 lb 15.8 oz (101.6 kg)] 222 lb 0.1 oz (100.7 kg) (01/02 0615)  Hemodynamic parameters for last 24 hours:    Intake/Output from previous day: 01/01 0701 - 01/02 0700 In: 587.5 [P.O.:100; I.V.:247.5] Out: 2500  Intake/Output this shift: No intake/output data recorded.  General appearance: alert, cooperative and no distress Neurologic: no focal deficit Heart: regular rate and rhythm Lungs: clear to auscultation bilaterally  Lab Results:  Recent Labs  02/27/16 0500 02/28/16 0430  WBC 19.8* 17.7*  HGB 8.2* 7.6*  HCT 24.5* 22.8*  PLT 296 278   BMET:  Recent Labs  02/27/16 0500 02/28/16 0430  NA 133* 132*  K 4.1 3.4*  CL 99* 95*  CO2 26 26  GLUCOSE 116* 93  BUN 9 8  CREATININE 3.48* 3.31*  CALCIUM 9.6 9.3    PT/INR:  Recent Labs  02/25/16 1300  LABPROT 23.5*  INR 2.05   ABG    Component Value Date/Time   PHART 7.430 02/25/2016 2142   HCO3 21.1 02/25/2016 2142   TCO2 32 02/26/2016 1707   ACIDBASEDEF 3.0 (H) 02/25/2016 2142   O2SAT 97.0 02/25/2016 2142   CBG (last 3)   Recent Labs  02/27/16 1555 02/27/16 2219 02/28/16 0755  GLUCAP 98 98 98    Assessment/Plan: S/P Procedure(s) (LRB): AORTIC VALVE REPLACEMENT (AVR) implanted with Magna Ease Aortic valve size 31mm (N/A) TRANSESOPHAGEAL ECHOCARDIOGRAM (TEE) (N/A) -  CV- s/p AVR for endocarditis-   Still on low dose norepi- should be  able to wean today  Receives midodrine on MWF for HD- will change to daily ID- endocarditis- long term antibiotics with ancef RESP- lungs with improved BS in bases, no wheezing RENAL- HD Sun and Mon. Na and K a little low ENDO_ CBG normal DVT prophylaxis- SCD + enoxaparin Deconditioning- severe. Mobilize, PT seeing today   LOS: 13 days    Melrose Nakayama 02/28/2016

## 2016-02-28 NOTE — Progress Notes (Signed)
[facility] Progress Note   Subjective:   Awake and alert  Weaning levophed HD 12/31 and again 1/1 Weight down to 101.6 post HD yesterday EDW 99.5 so slow headway getting volume down Surgery increased midodrine to daily (10 mg)   Objective Vitals:   02/28/16 0645 02/28/16 0700 02/28/16 0800 02/28/16 0900  BP: 106/65 (!) 125/58 97/61 (!) 95/52  Pulse: 92 93 96 98  Resp: (!) 22 (!) 23 (!) 23 (!) 21  Temp:   98.7 F (37.1 C)   TempSrc:   Oral   SpO2: 95% 95% 99% 96%  Weight:      Height:       Physical Exam Awake, alert, oriented NAD Up in the chair eating bfast Sternal dressing intact.  L TDC clean and dry. Regular rhythm O1H0 No S3 2/6 systolic murmur USB Abd obese, not tender Ext's with SCD's in place No LE edema noted R arm with extensive incisions/suturing upper and lower arm - healing   Recent Labs Lab 02/23/16 0537 02/24/16 0653  02/26/16 0400  02/26/16 1707 02/27/16 0500 02/28/16 0430  NA 132* 133*  < > 133*  --  151* 133* 132*  K 3.3* 3.5  < > 4.5  --  2.4* 4.1 3.4*  CL 97* 96*  < > 102  --  108 99* 95*  CO2 29 28  < > 21*  --   --  26 26  GLUCOSE 86 97  < > 102*  --  111* 116* 93  BUN 11 16  < > 13  --  <3* 9 8  CREATININE 4.16* 6.07*  < > 4.36*  < > 0.30* 3.48* 3.31*  CALCIUM 8.8* 9.8  < > 9.9  --   --  9.6 9.3  PHOS 3.4 4.3  --   --   --   --   --  3.0  < > = values in this interval not displayed. Liver Function Tests:  Recent Labs Lab 02/23/16 0537 02/24/16 0653 02/28/16 0430  ALBUMIN 1.9* 2.0* 2.1*   CBC:  Recent Labs Lab 02/25/16 1840 02/26/16 0400 02/26/16 1630 02/26/16 1707 02/27/16 0500 02/28/16 0430  WBC 22.0* 18.8* 21.7*  --  19.8* 17.7*  HGB 9.1* 8.6* 9.2* 10.5* 8.2* 7.6*  HCT 27.9* 26.0* 28.1* 31.0* 24.5* 22.8*  MCV 85.3 83.9 84.1  --  84.5 84.8  PLT 304 306 300  --  296 278   Blood Culture    Component Value Date/Time   SDES TISSUE 02/25/2016 1000   SPECREQUEST AORTIC VALVE LEAFLET POF ZINACEF AND  VANC 02/25/2016 1000   CULT  02/25/2016 1000    CULTURE REINCUBATED FOR BETTER GROWTH NO ANAEROBES ISOLATED; CULTURE IN PROGRESS FOR 5 DAYS    REPTSTATUS PENDING 02/25/2016 1000   Medications: . sodium chloride    . sodium chloride 10 mL/hr at 02/26/16 2000  . dexmedetomidine Stopped (02/25/16 1425)  . lactated ringers    . nitroGLYCERIN Stopped (02/25/16 1239)  . norepinephrine (LEVOPHED) Adult infusion 5 mcg/min (02/28/16 0843)  . phenylephrine (NEO-SYNEPHRINE) Adult infusion Stopped (02/25/16 1239)   . acetaminophen  1,000 mg Oral Q6H   Or  . acetaminophen (TYLENOL) oral liquid 160 mg/5 mL  1,000 mg Per Tube Q6H  . aspirin EC  325 mg Oral Daily   Or  . aspirin  324 mg Per Tube Daily  . bisacodyl  10 mg Oral Daily   Or  . bisacodyl  10 mg Rectal Daily  .  ceFAZolin (ANCEF) IV  2 g Intravenous Q M,W,F-1800  . cinacalcet  60 mg Oral QPM  . [START ON 02/29/2016] darbepoetin (ARANESP) injection - DIALYSIS  200 mcg Intravenous Q Wed-HD  . docusate sodium  200 mg Oral Daily  . enoxaparin (LOVENOX) injection  30 mg Subcutaneous QHS  . magnesium sulfate  4 g Intravenous Once  . mouth rinse  15 mL Mouth Rinse BID  . metoprolol tartrate  12.5 mg Oral BID   Or  . metoprolol tartrate  12.5 mg Per Tube BID  . midodrine  10 mg Oral Daily  . pantoprazole  40 mg Oral Daily  . rifampin  300 mg Oral Q12H  . sevelamer carbonate  1,600 mg Oral TID WC  . sodium chloride flush  3 mL Intravenous Q12H    Dialysis Orders: MWF at [facility]  4h 99.5kg 2/2 bath RUE AVG (now removed)/ new L IJ cath Hep 6000 w 2500 midrun - Mircera 211mcg IV q 2 weeks (last given 12/13) - Calcitriol 0.38mcg PO q HD  Assessment: 1. 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 Vol excess: up 6kg post op w mild pulm edema. HD 12/31, 1/1/ Weight slowly coming down.  3. Staph lugdenensis bacteremia w AVG infection: R AVG resection 12/22, last ID note "minimum of 6 weeks ATB's" so plan is IV  Ancef 6 weeks (antistaph ATB's initiated 12/21-has had vanco->ancef)+ rifampin (I presume also for 6 weeks) 4. ESRD: MWF HD. HD tomorrow. 4K bath. 20 mEq po K today. 5. Hypotension: chronic on midodrine 10 pre HD as outpt. Now on 10/day. Weaning levo. 6. Anemia of CKD/ABL: recent vaginal/ AVG bleeding. Hgb down 7.6. s/p 4U prbc's since admission. Tsat low, but holding off on IV iron in setting of bacteremia. Aranesp 1110mcg resumed 12/27, increase to 280mcg weekly for 1/3. Transfuse 1 unit on HD 1/3. 7. Secondary hyperparathyroidism: Need to restart calcitriol. On sensipar and sevelamer. 8. Endometrial hyperplasia: Megace on hold. Says noting some "leakage" requiring a pad. RN will help to monitor.  Plan - HD 1/3, 4K bath, slow decr to EDW, midodrine (increase to TID), replace 20 mEq today, incr Aranesp tp 200, transfuse 1 unit with HD in am.   Jamal Maes, MD [place] Pager 02/28/2016, 9:25 AM

## 2016-02-28 NOTE — Progress Notes (Signed)
Patient ID: [name] Marks, female   DOB: 12-Jul-1955, 61 y.o.   MRN: [id number]  SICU Evening Rounds:  Hemodynamically stable off levophed this evening.  Ambulated some  Eating dinner  Plan HD tomorrow.

## 2016-02-29 ENCOUNTER — Inpatient Hospital Stay (HOSPITAL_COMMUNITY): Payer: Medicare Other

## 2016-02-29 ENCOUNTER — Encounter: Payer: Medicare Other | Admitting: Family

## 2016-02-29 LAB — COMPREHENSIVE METABOLIC PANEL
ALBUMIN: 1.9 g/dL — AB (ref 3.5–5.0)
ALK PHOS: 111 U/L (ref 38–126)
ANION GAP: 7 (ref 5–15)
AST: 23 U/L (ref 15–41)
BILIRUBIN TOTAL: 0.4 mg/dL (ref 0.3–1.2)
BUN: 17 mg/dL (ref 6–20)
CALCIUM: 9.5 mg/dL (ref 8.9–10.3)
CO2: 27 mmol/L (ref 22–32)
CREATININE: 4.52 mg/dL — AB (ref 0.44–1.00)
Chloride: 99 mmol/L — ABNORMAL LOW (ref 101–111)
GFR calc Af Amer: 11 mL/min — ABNORMAL LOW (ref >60)
GFR calc non Af Amer: 10 mL/min — ABNORMAL LOW (ref >60)
GLUCOSE: 76 mg/dL (ref 65–99)
Potassium: 3.9 mmol/L (ref 3.5–5.1)
SODIUM: 133 mmol/L — AB (ref 135–145)
TOTAL PROTEIN: 5.8 g/dL — AB (ref 6.5–8.1)

## 2016-02-29 LAB — GLUCOSE, CAPILLARY
GLUCOSE-CAPILLARY: 99 mg/dL (ref 65–99)
GLUCOSE-CAPILLARY: 94 mg/dL (ref 65–99)
Glucose-Capillary: 69 mg/dL (ref 65–99)
Glucose-Capillary: 74 mg/dL (ref 65–99)
Glucose-Capillary: 84 mg/dL (ref 65–99)
Glucose-Capillary: 86 mg/dL (ref 65–99)

## 2016-02-29 LAB — CBC
HEMATOCRIT: 23.3 % — AB (ref 36.0–46.0)
HEMOGLOBIN: 7.8 g/dL — AB (ref 12.0–15.0)
MCH: 28.8 pg (ref 26.0–34.0)
MCHC: 33.5 g/dL (ref 30.0–36.0)
MCV: 86 fL (ref 78.0–100.0)
Platelets: 281 10*3/uL (ref 150–400)
RBC: 2.71 MIL/uL — AB (ref 3.87–5.11)
RDW: 19.1 % — ABNORMAL HIGH (ref 11.5–15.5)
WBC: 16.5 10*3/uL — AB (ref 4.0–10.5)

## 2016-02-29 LAB — PREPARE RBC (CROSSMATCH)

## 2016-02-29 MED ORDER — SODIUM CHLORIDE 0.9 % IV SOLN: Freq: Once | INTRAVENOUS | Status: DC

## 2016-02-29 MED ORDER — DARBEPOETIN ALFA 200 MCG/0.4ML IJ SOSY
PREFILLED_SYRINGE | INTRAMUSCULAR | Status: AC
  Filled 2016-02-29: qty 0.4

## 2016-02-29 NOTE — Progress Notes (Signed)
  [facility] Progress Note  Currently on HD, no complaints.   S/p removal right arm grafts. Incisions are healed. Nylon sutures removed today. Currently using left IJ TDC for HD. Will have patient follow-up in several weeks for new access once patient recovers from current surgery.   Alvia Grove 02/29/2016 8:37 AM

## 2016-02-29 NOTE — Progress Notes (Signed)
[facility] Progress Note   Background: 61 yo AAF admitted with infection/bleeding from RUE AVG, + BC's staph lugdenensis, aortic valve endocarditis, s/p removal of infected AVG 12/22 and AoV replacement 02/25/16.   Dialysis Orders: MWF at [facility]  4h  99.5kg  2/2 bath  RUE AVG (now removed)/ new L IJ cath  Hep 6000 w 2500 midrun - Mircera 242mcg IV q 2 weeks (last given 12/13) - Calcitriol 0.51mcg PO q HD  Assessment: 1. Staph lugdenensis bacteremia w AVG infection: R AVG resected 12/22. Current plan is IV Ancef + po rifampin for 6 weeks (antistaph ATB's initiated 12/21-has had vanco->ancef) 2. Aortic valve endocarditis (staph lugdenensis) - sp AoVR 02/25/16. Ancef and rifampin for 6 weeks as above.  3. Vol excess: up 6kg post op w mild pulm edema. HD 12/31, 1/1/ Weight slowly coming down. HD today. Pre wieght 101/3 4. ESRD: MWF HD. 4K bath. K 3.9 after 20 po KDUr 1/2 5. Hypotension: Levo weaned off. Midodrine increased to 10 TID.  6. Anemia of CKD/ABL: recent vaginal/ AVG bleeding. Hgb  7.6. s/p 4U prbc's since admission. Tsat low, but holding off on IV iron in setting of bacteremia. Aranesp 110mcg resumed 12/27, increased to 273mcg weekly 1/3. Transfusing 1 unit on HD today. 7. Secondary hyperparathyroidism: Calcitrol/sensipar and sevelamer. 8. Endometrial hyperplasia: Megace on hold. Says noting some "leakage" requiring a pad. RN will help to monitor.  Plan - HD 1/3 (today), 4K bath, slow decr to EDW, midodrine (increased to TID), incr'd Aranesp tp 200 (1/3), transfusing 1 unit with HD today. Vanco/rifampin for 6 weeks    Jamal Maes, MD [facility] [phone] Pager 02/29/2016, 10:25 AM   Subjective:   Awake and alert  Levophed off Tolerating HD  Midodrine at 10 TID   Objective Vitals:   02/29/16 0925 02/29/16 0930 02/29/16 0945 02/29/16 1000  BP: (!) 98/53 (!) 98/56  (!) 97/54  Pulse: 82 79 81 82  Resp: (!) 23 (!) 24  18  Temp: 97.5  F (36.4 C)  97.5 F (36.4 C)   TempSrc:   Oral   SpO2: 99%     Weight:      Height:       Physical Exam Awake, alert, oriented On dialysis Sternal dressing intact.  L TDC clean and dry (12/22). R IJ central line Regular rhythm [id number] No S3 2/6 systolic murmur USB Abd obese, not tender Ext's with SCD's in place No LE edema noted R arm with extensive incisions upper and lower arm - healing - sutures were removed today   Recent Labs Lab 02/23/16 0537 02/24/16 0653  02/27/16 0500 02/28/16 0430 02/29/16 0300  NA 132* 133*  < > 133* 132* 133*  K 3.3* 3.5  < > 4.1 3.4* 3.9  CL 97* 96*  < > 99* 95* 99*  CO2 29 28  < > 26 26 27   GLUCOSE 86 97  < > 116* 93 76  BUN 11 16  < > 9 8 17   CREATININE 4.16* 6.07*  < > 3.48* 3.31* 4.52*  CALCIUM 8.8* 9.8  < > 9.6 9.3 9.5  PHOS 3.4 4.3  --   --  3.0  --   < > = values in this interval not displayed.   Recent Labs Lab 02/24/16 0653 02/28/16 0430 02/29/16 0300  AST  --   --  23  ALT  --   --  <5*  ALKPHOS  --   --  111  BILITOT  --   --  0.4  PROT  --   --  5.8*  ALBUMIN 2.0* 2.1* 1.9*   CBC:  Recent Labs Lab 02/26/16 0400 02/26/16 1630  02/27/16 0500 02/28/16 0430 02/29/16 0300  WBC 18.8* 21.7*  --  19.8* 17.7* 16.5*  HGB 8.6* 9.2*  < > 8.2* 7.6* 7.8*  HCT 26.0* 28.1*  < > 24.5* 22.8* 23.3*  MCV 83.9 84.1  --  84.5 84.8 86.0  PLT 306 300  --  296 278 281  < > = values in this interval not displayed.  Blood Culture    Component Value Date/Time   SDES TISSUE 02/25/2016 1000   SPECREQUEST AORTIC VALVE LEAFLET POF ZINACEF AND VANC 02/25/2016 1000   CULT  02/25/2016 1000    CULTURE REINCUBATED FOR BETTER GROWTH NO ANAEROBES ISOLATED; CULTURE IN PROGRESS FOR 5 DAYS    REPTSTATUS PENDING 02/25/2016 1000   Medications: . sodium chloride    . sodium chloride 10 mL/hr at 02/28/16 0900  . dexmedetomidine Stopped (02/25/16 1425)  . lactated ringers    . nitroGLYCERIN Stopped (02/25/16 1239)  . norepinephrine  (LEVOPHED) Adult infusion Stopped (02/28/16 1502)  . phenylephrine (NEO-SYNEPHRINE) Adult infusion Stopped (02/25/16 1239)   . sodium chloride   Intravenous Once  . acetaminophen  1,000 mg Oral Q6H   Or  . acetaminophen (TYLENOL) oral liquid 160 mg/5 mL  1,000 mg Per Tube Q6H  . aspirin EC  325 mg Oral Daily   Or  . aspirin  324 mg Per Tube Daily  . bisacodyl  10 mg Oral Daily   Or  . bisacodyl  10 mg Rectal Daily  .  ceFAZolin (ANCEF) IV  2 g Intravenous Q M,W,F-1800  . cinacalcet  60 mg Oral QPM  . darbepoetin (ARANESP) injection - DIALYSIS  200 mcg Intravenous Q Wed-HD  . docusate sodium  200 mg Oral Daily  . enoxaparin (LOVENOX) injection  30 mg Subcutaneous QHS  . magnesium sulfate  4 g Intravenous Once  . mouth rinse  15 mL Mouth Rinse BID  . metoprolol tartrate  12.5 mg Oral BID   Or  . metoprolol tartrate  12.5 mg Per Tube BID  . midodrine  10 mg Oral TID WC  . pantoprazole  40 mg Oral Daily  . rifampin  300 mg Oral Q12H  . sevelamer carbonate  1,600 mg Oral TID WC  . sodium chloride flush  3 mL Intravenous Q12H

## 2016-02-29 NOTE — Care Management Note (Signed)
Case Management Note  Patient Details  Name: [name] Marks MRN: [id number] Date of Birth: August 28, 1955  Subjective/Objective:    PT/OT recommend SNF for rehab, CM discussed with pt who states she has no one to assist her when she is discharged, agrees to ST-SNF for rehab.                        Expected Discharge Plan:  Skilled Nursing Facility  In-House Referral:  Clinical Social Work  Discharge planning Services  CM Consult  Status of Service:  In process, will continue to follow  Girard Cooter, RN 02/29/2016, 2:39 PM

## 2016-02-29 NOTE — Progress Notes (Signed)
4 Days Post-Op Procedure(s) (LRB): AORTIC VALVE REPLACEMENT (AVR) implanted with Magna Ease Aortic valve size 65mm (N/A) TRANSESOPHAGEAL ECHOCARDIOGRAM (TEE) (N/A) Subjective: No complaints this AM On HD  Objective: Vital signs in last 24 hours: Temp:  [97.1 F (36.2 C)-98.6 F (37 C)] 98.6 F (37 C) (01/03 0805) Pulse Rate:  [71-104] 82 (01/03 0800) Cardiac Rhythm: Normal sinus rhythm (01/03 0400) Resp:  [12-32] 22 (01/03 0800) BP: (71-132)/(40-81) 113/56 (01/03 0800) SpO2:  [96 %-100 %] 98 % (01/03 0800) Weight:  [223 lb 5.2 oz (101.3 kg)] 223 lb 5.2 oz (101.3 kg) (01/03 0700)  Hemodynamic parameters for last 24 hours:    Intake/Output from previous day: 01/02 0701 - 01/03 0700 In: 652 [P.O.:200; I.V.:232] Out: -  Intake/Output this shift: No intake/output data recorded.  General appearance: alert, cooperative and no distress A& O x 3, no focal deficit Cardiac RRR Lungs decreased in bases Incision: clean and dry  Lab Results:  Recent Labs  02/28/16 0430 02/29/16 0300  WBC 17.7* 16.5*  HGB 7.6* 7.8*  HCT 22.8* 23.3*  PLT 278 281   BMET:  Recent Labs  02/28/16 0430 02/29/16 0300  NA 132* 133*  K 3.4* 3.9  CL 95* 99*  CO2 26 27  GLUCOSE 93 76  BUN 8 17  CREATININE 3.31* 4.52*  CALCIUM 9.3 9.5    PT/INR: No results for input(s): LABPROT, INR in the last 72 hours. 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

## 2016-03-01 LAB — RENAL FUNCTION PANEL
ALBUMIN: 1.9 g/dL — AB (ref 3.5–5.0)
Anion gap: 8 (ref 5–15)
BUN: 15 mg/dL (ref 6–20)
CO2: 26 mmol/L (ref 22–32)
CREATININE: 3.97 mg/dL — AB (ref 0.44–1.00)
Calcium: 9.3 mg/dL (ref 8.9–10.3)
Chloride: 100 mmol/L — ABNORMAL LOW (ref 101–111)
GFR calc non Af Amer: 11 mL/min — ABNORMAL LOW (ref >60)
GFR, EST AFRICAN AMERICAN: 13 mL/min — AB (ref >60)
GLUCOSE: 78 mg/dL (ref 65–99)
PHOSPHORUS: 2.9 mg/dL (ref 2.5–4.6)
Potassium: 3.9 mmol/L (ref 3.5–5.1)
Sodium: 134 mmol/L — ABNORMAL LOW (ref 135–145)

## 2016-03-01 LAB — GLUCOSE, CAPILLARY
Glucose-Capillary: 79 mg/dL (ref 65–99)
Glucose-Capillary: 69 mg/dL (ref 65–99)

## 2016-03-01 LAB — AEROBIC/ANAEROBIC CULTURE W GRAM STAIN (SURGICAL/DEEP WOUND): Gram Stain: NONE SEEN

## 2016-03-01 LAB — CBC
HCT: 26.1 % — ABNORMAL LOW (ref 36.0–46.0)
HEMOGLOBIN: 8.5 g/dL — AB (ref 12.0–15.0)
MCH: 28.1 pg (ref 26.0–34.0)
MCHC: 32.6 g/dL (ref 30.0–36.0)
MCV: 86.4 fL (ref 78.0–100.0)
PLATELETS: 280 10*3/uL (ref 150–400)
RBC: 3.02 MIL/uL — AB (ref 3.87–5.11)
RDW: 18.6 % — ABNORMAL HIGH (ref 11.5–15.5)
WBC: 13.5 10*3/uL — ABNORMAL HIGH (ref 4.0–10.5)

## 2016-03-01 LAB — AEROBIC/ANAEROBIC CULTURE (SURGICAL/DEEP WOUND)

## 2016-03-01 LAB — TYPE AND SCREEN
ABO/RH(D): A POS
Antibody Screen: NEGATIVE
Unit division: 0

## 2016-03-01 MED ORDER — MOVING RIGHT ALONG BOOK
Freq: Once | Status: AC
  Administered 2016-03-01: 12:00:00
  Filled 2016-03-01: qty 1

## 2016-03-01 MED ORDER — SODIUM CHLORIDE 0.9% FLUSH: 3.0000 mL | INTRAVENOUS | Status: DC | PRN

## 2016-03-01 MED ORDER — SODIUM CHLORIDE 0.9% FLUSH: 3.0000 mL | Freq: Two times a day (BID) | INTRAVENOUS | Status: DC

## 2016-03-01 MED ORDER — SODIUM CHLORIDE 0.9 % IV SOLN
250.0000 mL | INTRAVENOUS | Status: DC | PRN
  Administered 2016-03-02: 250 mL via INTRAVENOUS

## 2016-03-01 MED FILL — Heparin Sodium (Porcine) Inj 1000 Unit/ML: INTRAMUSCULAR | Qty: 2500 | Status: AC

## 2016-03-01 NOTE — Plan of Care (Signed)
Problem: Activity: Goal: Risk for activity intolerance will decrease Outcome: Progressing Working with PT  Problem: Bowel/Gastric: Goal: Gastrointestinal status for postoperative course will improve Outcome: Progressing BM 02/29/16  Problem: Cardiac: Goal: Hemodynamic stability will improve Outcome: Progressing Off vasoactive gtts  Problem: Nutritional: Goal: Risk for body nutrition deficit will decrease Outcome: Not Progressing Pt remains with poor appetite. Pt educated on importance of eating and different food options. Snacks offered.

## 2016-03-01 NOTE — Progress Notes (Signed)
Physical Therapy Treatment Patient Details Name: [name] Marks MRN: [id number] DOB: 05/17/1955 Today's Date: 03/01/2016    History of Present Illness [name] Marks is a 61 y.o. female admitted with AMS, infected RUE AV graft and endocarditis. Pt s/p AVR on 12/31. PMH is significant for ESRD on HD, gout    PT Comments    Pt progressing with gait and able to increase ambulation distance today. Only able to recall 2/5 precautions and educated for all. Pt educated for transfers, function and HEP. Will continue to follow and encouraged mobility with nursing assist.   HR 85-105 BP 117/60  pre 122/78 post   Follow Up Recommendations  SNF;Supervision/Assistance - 24 hour     Equipment Recommendations  Rolling walker with 5" wheels    Recommendations for Other Services       Precautions / Restrictions Precautions Precautions: Fall;Sternal Restrictions Weight Bearing Restrictions:  (sternal precautions)    Mobility  Bed Mobility Overal bed mobility: Needs Assistance Bed Mobility: Rolling;Sidelying to Sit Rolling: Min assist Sidelying to sit: Mod assist       General bed mobility comments: cues for sequence with assist to rotate trunk and pelvis to roll as well as assist to elevate trunk   Transfers Overall transfer level: Needs assistance   Transfers: Sit to/from Stand Sit to Stand: Mod assist         General transfer comment: mod assist to stand from bed with cues for hands on thighs, assist for scootind to edge and anterior translation with increased time to steady once standing due to posterior lean  Ambulation/Gait Ambulation/Gait assistance: Min assist Ambulation Distance (Feet): 100 Feet Assistive device: Rolling walker (2 wheeled) Gait Pattern/deviations: Step-through pattern;Decreased stride length;Trunk flexed   Gait velocity interpretation: Below normal speed for age/gender General Gait Details: cues for posture, position in RW, breathing technique and  safety   Stairs            Wheelchair Mobility    Modified Rankin (Stroke Patients Only)       Balance Overall balance assessment: Needs assistance   Sitting balance-Leahy Scale: Fair       Standing balance-Leahy Scale: Poor                      Cognition Arousal/Alertness: Awake/alert Behavior During Therapy: Flat affect Overall Cognitive Status: Within Functional Limits for tasks assessed       Memory: Decreased recall of precautions              Exercises General Exercises - Lower Extremity Long Arc Quad: AROM;Both;Seated;15 reps Hip Flexion/Marching: AROM;Both;Seated;20 reps    General Comments        Pertinent Vitals/Pain Pain Score: 3  Pain Location: sternum Pain Descriptors / Indicators: Sore;Aching Pain Intervention(s): Limited activity within patient's tolerance;Monitored during session;Repositioned    Home Living                      Prior Function            PT Goals (current goals can now be found in the care plan section) Progress towards PT goals: Progressing toward goals    Frequency    Min 3X/week      PT Plan Current plan remains appropriate    Co-evaluation             End of Session Equipment Utilized During Treatment: Gait belt Activity Tolerance: Patient tolerated treatment well Patient left: in chair;with call bell/phone  within reach     Time: 1338-1403 PT Time Calculation (min) (ACUTE ONLY): 25 min  Charges:  $Gait Training: 8-22 mins $Therapeutic Exercise: 8-22 mins                    G Codes:      Mads Borgmeyer B [name] Marks 03-08-2016, 2:09 PM Elwyn Reach, [place]

## 2016-03-01 NOTE — NC FL2 (Signed)
[place] LEVEL OF CARE SCREENING TOOL     IDENTIFICATION  Patient Name: [name] Marks Birthdate: 04-Aug-1955 Sex: female Admission Date (Current Location): 02/15/2016  [facility] and [place] Number:  Herbalist and Address:  [facility], [place] [address], [place], [place] [address]      Provider Number: [id number]  Attending Physician Name and Address:  Melrose Nakayama, MD  Relative Name and Phone Number:  [name], [phone]    Current Level of Care: Hospital Recommended Level of Care: [place] Prior Approval Number:    Date Approved/Denied:   PASRR Number: [id number]  Discharge Plan: SNF    Current Diagnoses: Patient Active Problem List   Diagnosis Date Noted  . S/P AVR 02/25/2016  . Aortic valve vegetation   . Severe aortic insufficiency   . Bacteremia due to coagulase-negative Staphylococcus   . S/P dialysis catheter insertion ([place])   . Bilateral low back pain with sciatica   . Neck pain   . Endocarditis of native valve   . End stage renal disease on dialysis ([place])   . Staphylococcus aureus bacteremia   . Anemia   . Infection of AV graft for dialysis ([place])   . Bacteremia   . Septic shock ([place])   . Encephalopathy   . Altered mental status 02/15/2016  . Renal dialysis device, implant, or graft complication 90/30/0923  . Pain and swelling of right upper extremity 12/20/2015  . Complex endometrial hyperplasia with atypia 11/08/2015  . Increased endometrial stripe thickness 11/03/2015  . Obesity 10/28/2014  . Shoulder pain, bilateral 10/28/2014  . Health care maintenance 10/28/2014  . Hypotension, unspecified 01/14/2013  . Right carotid bruit 01/08/2013  . Generalized headaches 01/08/2013  . Chronic female pelvic pain 08/08/2012  . Gout, unspecified 12/06/2008  . Esophageal reflux 12/06/2008  . BACK PAIN 12/06/2008  . FIBROIDS, UTERUS 11/24/2008  . ESRD (end stage renal disease)  on dialysis ([place]) 07/01/2006    Orientation RESPIRATION BLADDER Height & Weight     Self, Time, Situation, Place  Normal Continent Weight: 218 lb 11.1 oz (99.2 kg) Height:  5\' 1"  (154.9 cm)  BEHAVIORAL SYMPTOMS/MOOD NEUROLOGICAL BOWEL NUTRITION STATUS      Continent Diet (diet renal, fluid restriction)  AMBULATORY STATUS COMMUNICATION OF NEEDS Skin   Limited Assist Verbally Surgical wounds, Normal                       Personal Care Assistance Level of Assistance  Bathing, Feeding, Dressing Bathing Assistance: Limited assistance Feeding assistance: Independent Dressing Assistance: Limited assistance     Functional Limitations Info  Sight, Hearing, Speech Sight Info: Adequate Hearing Info: Adequate Speech Info: Adequate    SPECIAL CARE FACTORS FREQUENCY  PT (By licensed PT), OT (By licensed OT)     PT Frequency: 5x week OT Frequency: 5x week            Contractures Contractures Info: Not present    Additional Factors Info  Code Status Code Status Info: Full Code             Current Medications (03/01/2016):  This is the current hospital active medication list Current Facility-Administered Medications  Medication Dose Route Frequency Provider Last Rate Last Dose  . 0.9 %  sodium chloride infusion  250 mL Intravenous PRN Melrose Nakayama, MD      . acetaminophen (TYLENOL) tablet 1,000 mg  1,000 mg Oral Q6H John Giovanni,  PA-C   1,000 mg at 03/01/16 1301   Or  . acetaminophen (TYLENOL) solution 1,000 mg  1,000 mg Per Tube Q6H Wayne E Gold, PA-C      . alteplase (CATHFLO ACTIVASE) injection 2 mg  2 mg Intracatheter Once PRN Roney Jaffe, MD      . aspirin EC tablet 325 mg  325 mg Oral Daily John Giovanni, PA-C   325 mg at 03/01/16 1052   Or  . aspirin chewable tablet 324 mg  324 mg Per Tube Daily Wayne E Gold, PA-C      . bisacodyl (DULCOLAX) EC tablet 10 mg  10 mg Oral Daily John Giovanni, PA-C   10 mg at 02/29/16 1004   Or  . bisacodyl (DULCOLAX)  suppository 10 mg  10 mg Rectal Daily Wayne E Gold, PA-C      . ceFAZolin (ANCEF) IVPB 2g/100 mL premix  2 g Intravenous Q M,W,F-1800 Lauren D Bajbus, RPH   2 g at 02/29/16 1735  . cinacalcet (SENSIPAR) tablet 60 mg  60 mg Oral QPM Veatrice Bourbon, MD   60 mg at 02/29/16 1628  . Darbepoetin Alfa (ARANESP) injection 200 mcg  200 mcg Intravenous Q Wed-HD Loren Racer, PA-C   200 mcg at 02/29/16 0840  . enoxaparin (LOVENOX) injection 30 mg  30 mg Subcutaneous QHS Melrose Nakayama, MD   30 mg at 02/29/16 2124  . heparin injection 1,000 Units  1,000 Units Dialysis PRN Roney Jaffe, MD      . lidocaine (PF) (XYLOCAINE) 1 % injection 5 mL  5 mL Intradermal PRN Roney Jaffe, MD      . lidocaine-prilocaine (EMLA) cream 1 application  1 application Topical PRN Roney Jaffe, MD      . MEDLINE mouth rinse  15 mL Mouth Rinse BID Melrose Nakayama, MD   15 mL at 02/29/16 2200  . midodrine (PROAMATINE) tablet 10 mg  10 mg Oral TID WC Jamal Maes, MD   10 mg at 03/01/16 1301  . moving right along book   Does not apply Once Melrose Nakayama, MD      . ondansetron [facility]) injection 4 mg  4 mg Intravenous Q6H PRN John Giovanni, PA-C      . oxyCODONE (Oxy IR/ROXICODONE) immediate release tablet 5-10 mg  5-10 mg Oral Q3H PRN John Giovanni, PA-C   5 mg at 02/29/16 2325  . pentafluoroprop-tetrafluoroeth (GEBAUERS) aerosol 1 application  1 application Topical PRN Roney Jaffe, MD      . rifampin (RIFADIN) capsule 300 mg  300 mg Oral Q12H Truman Hayward, MD   300 mg at 03/01/16 1052  . sevelamer carbonate (RENVELA) tablet 1,600 mg  1,600 mg Oral TID WC Alyssa A Haney, MD   1,600 mg at 03/01/16 1301  . sodium chloride flush (NS) 0.9 % injection 3 mL  3 mL Intravenous Q12H Melrose Nakayama, MD      . sodium chloride flush (NS) 0.9 % injection 3 mL  3 mL Intravenous PRN Melrose Nakayama, MD      . traMADol Veatrice Bourbon) tablet 50 mg  50 mg Oral Q4H PRN John Giovanni, PA-C         Discharge  Medications: Please see discharge summary for a list of discharge medications.  Relevant Imaging Results:  Relevant Lab Results:   Additional Information [id number]  Wende Neighbors, LCSW

## 2016-03-01 NOTE — Clinical Social Work Placement (Signed)
   CLINICAL SOCIAL WORK PLACEMENT  NOTE  Date:  03/01/2016  Patient Details  Name: [name] Marks MRN: [id number] Date of Birth: 1955-12-04  Clinical Social Work is seeking post-discharge placement for this patient at the [place] level of care (*CSW will initial, date and re-position this form in  chart as items are completed):  Yes   Patient/family provided with [place] Work Department's list of facilities offering this level of care within the geographic area requested by the patient (or if unable, by the patient's family).  Yes   Patient/family informed of their freedom to choose among providers that offer the needed level of care, that participate in Medicare, Medicaid or managed care program needed by the patient, have an available bed and are willing to accept the patient.  Yes   Patient/family informed of [facility]'s ownership interest in [facility] and [facility], as well as of the fact that they are under no obligation to receive care at these facilities.  PASRR submitted to EDS on 03/01/16     PASRR number received on 03/01/16     Existing PASRR number confirmed on       FL2 transmitted to all facilities in geographic area requested by pt/family on       FL2 transmitted to all facilities within larger geographic area on       Patient informed that his/her managed care company has contracts with or will negotiate with certain facilities, including the following:            Patient/family informed of bed offers received.  Patient chooses bed at       Physician recommends and patient chooses bed at      Patient to be transferred to   on  .  Patient to be transferred to facility by       Patient family notified on   of transfer.  Name of family member notified:        PHYSICIAN Please sign FL2     Additional Comment:    _______________________________________________ Wende Neighbors, LCSW 03/01/2016, 1:26 PM

## 2016-03-01 NOTE — Progress Notes (Signed)
[facility] Progress Note   Background: 61 yo AAF admitted with infection/bleeding from RUE AVG, + BC's staph lugdenensis, aortic valve endocarditis, s/p removal of infected AVG 12/22 and AoV replacement 02/24/61.   Dialysis Orders: MWF at [facility]  4h  99.5kg  2/2 bath  RUE AVG (now removed)/ new L IJ cath  Hep 6000 w 2500 midrun - Mircera 233mcg IV q 2 weeks (last given 12/13) - Calcitriol 0.66mcg PO q HD  Assessment: 1. Staph lugdenensis bacteremia w AVG infection: R AVG resected 12/22. Current plan is IV Ancef + po rifampin for 6 weeks (antistaph ATB's initiated 12/21-has had vanco->ancef) 2. Aortic valve endocarditis (staph lugdenensis) - sp AoVR 02/25/16. Ancef and rifampin for 6 weeks as above.  3. Vol excess: Coming back towads outpatient EDW -- will prob need to be lower 4. ESRD: MWF HD. 4K bath. K 3.9 after 20 po KDUr 1/2 5. Hypotension: Levo weaned off. Midodrine increased to 10 TID.  6. Anemia of CKD/ABL: recent vaginal/ AVG bleeding. Hgb  7.6. s/p 4U prbc's since admission. Tsat low, but holding off on IV iron in setting of bacteremia. Aranesp 190mcg resumed 12/27, increased to 250mcg weekly 1/3. Transfusing 1 unit on HD today. 7. Secondary hyperparathyroidism: Calcitrol/sensipar and sevelamer. 8. Endometrial hyperplasia: Megace on hold. Says noting some "leakage" requiring a pad. RN will help to monitor.  Plan - HD on MWF schedule.  Tight heparin Can try to do on 6E HD unit. Could she rec IV Cefazolin with HD instead of placing powerline? Cont to challenge post weights.     [name], MD [facility] [phone] Pager 02/29/2016, 10:25 AM   Subjective:   HD yesterday, weights 99.2kg, near EDW (which is now to high most likely)    Objective Vitals:   03/01/16 0700 03/01/16 0800 03/01/16 0812 03/01/16 0900  BP: (!) 91/56 (!) 102/58  104/72  Pulse: 77 79  81  Resp: 20 (!) 22  (!) 26  Temp:   98.2 F (36.8 C)   TempSrc:   Oral    SpO2: 99% 97%  100%  Weight:      Height:       Physical Exam Awake, alert, oriented On dialysis Sternal dressing intact.  L TDC clean and dry (12/22). R IJ central line Regular rhythm U2V2 No S3 2/6 systolic murmur USB Abd obese, not tender Ext's with SCD's in place No LE edema noted R arm with extensive incisions upper and lower arm - healing - sutures were removed today   Recent Labs Lab 02/24/16 0653  02/28/16 0430 02/29/16 0300 03/01/16 0310  NA 133*  < > 132* 133* 134*  K 3.5  < > 3.4* 3.9 3.9  CL 96*  < > 95* 99* 100*  CO2 28  < > 26 27 26   GLUCOSE 97  < > 93 76 78  BUN 16  < > 8 17 15   CREATININE 6.07*  < > 3.31* 4.52* 3.97*  CALCIUM 9.8  < > 9.3 9.5 9.3  PHOS 4.3  --  3.0  --  2.9  < > = values in this interval not displayed.   Recent Labs Lab 02/28/16 0430 02/29/16 0300 03/01/16 0310  AST  --  23  --   ALT  --  <5*  --   ALKPHOS  --  111  --   BILITOT  --  0.4  --   PROT  --  5.8*  --   ALBUMIN 2.1* 1.9*  1.9*   CBC:  Recent Labs Lab 02/26/16 1630  02/27/16 0500 02/28/16 0430 02/29/16 0300 03/01/16 0310  WBC 21.7*  --  19.8* 17.7* 16.5* 13.5*  HGB 9.2*  < > 8.2* 7.6* 7.8* 8.5*  HCT 28.1*  < > 24.5* 22.8* 23.3* 26.1*  MCV 84.1  --  84.5 84.8 86.0 86.4  PLT 300  --  296 278 281 280  < > = values in this interval not displayed.  Blood Culture    Component Value Date/Time   SDES TISSUE 02/25/2016 1000   SPECREQUEST AORTIC VALVE LEAFLET POF ZINACEF AND VANC 02/25/2016 1000   CULT  02/25/2016 1000    FEW STAPHYLOCOCCUS LUGDUNENSIS NO ANAEROBES ISOLATED; CULTURE IN PROGRESS FOR 5 DAYS    REPTSTATUS PENDING 02/25/2016 1000   Medications: . sodium chloride    . sodium chloride 10 mL/hr at 02/28/16 0900  . dexmedetomidine Stopped (02/25/16 1425)  . lactated ringers    . nitroGLYCERIN Stopped (02/25/16 1239)  . norepinephrine (LEVOPHED) Adult infusion Stopped (02/28/16 1502)  . phenylephrine (NEO-SYNEPHRINE) Adult infusion Stopped  (02/25/16 1239)   . sodium chloride   Intravenous Once  . acetaminophen  1,000 mg Oral Q6H   Or  . acetaminophen (TYLENOL) oral liquid 160 mg/5 mL  1,000 mg Per Tube Q6H  . aspirin EC  325 mg Oral Daily   Or  . aspirin  324 mg Per Tube Daily  . bisacodyl  10 mg Oral Daily   Or  . bisacodyl  10 mg Rectal Daily  .  ceFAZolin (ANCEF) IV  2 g Intravenous Q M,W,F-1800  . cinacalcet  60 mg Oral QPM  . darbepoetin (ARANESP) injection - DIALYSIS  200 mcg Intravenous Q Wed-HD  . docusate sodium  200 mg Oral Daily  . enoxaparin (LOVENOX) injection  30 mg Subcutaneous QHS  . magnesium sulfate  4 g Intravenous Once  . mouth rinse  15 mL Mouth Rinse BID  . midodrine  10 mg Oral TID WC  . pantoprazole  40 mg Oral Daily  . rifampin  300 mg Oral Q12H  . sevelamer carbonate  1,600 mg Oral TID WC  . sodium chloride flush  3 mL Intravenous Q12H

## 2016-03-01 NOTE — Clinical Social Work Note (Signed)
Clinical Social Work Assessment  Patient Details  Name: [name] Marks MRN: [id number] Date of Birth: 11-Jan-1956  Date of referral:  03/01/16               Reason for consult:  Discharge Planning                Permission sought to share information with:  Family Supports Permission granted to share information::  Yes, Verbal Permission Granted  Name::     [name] Marks  Agency::     Relationship::  daughter  Contact Information:  [phone]  Housing/Transportation Living arrangements for the past 2 months:  Apartment Source of Information:  Patient Patient Interpreter Needed:  None Criminal Activity/Legal Involvement Pertinent to Current Situation/Hospitalization:  No - Comment as needed Significant Relationships:  Adult Children Lives with:    Do you feel safe going back to the place where you live?  Yes Need for family participation in patient care:  Yes (Comment)  Care giving concerns:  Patient stated she has adult children that are supportive of her  Social Worker assessment / plan:  Clinical Social Worker met patient at bedside to offer support and discuss patients needs at discharge. Patient stated she lives at home by herself. Patient is agreeable to go to SNF after discharge and would prefer to be in [place] to stay close to family. CSW to complete necessary paperwork and initiate SNF search on patients behalf. CSW to follow up with patient once bed offers are available. CSW remains available for support and to facilitate patient discharge needs once medically ready.   Employment status:  Retired Forensic scientist:  Medicare PT Recommendations:  [place] / Referral to community resources:  [place]  Patient/Family's Response to care:  Patient verbalized appreciation and understanding for CSW role and involvement in care. Patient agreeable with current discharge plan to SNF following discharge.   Patient/Family's  Understanding of and Emotional Response to Diagnosis, Current Treatment, and Prognosis:  Patient with good understanding of current medical state and limitations around most recent hospitalization. Patient agreeable with SNF placement in hopes of transitioning back home   Emotional Assessment Appearance:  Appears stated age Attitude/Demeanor/Rapport:  Other Affect (typically observed):  Calm Orientation:  Oriented to Self, Oriented to Place, Oriented to  Time, Oriented to Situation Alcohol / Substance use:    Psych involvement (Current and /or in the community):  No (Comment)  Discharge Needs  Concerns to be addressed:  No discharge needs identified Readmission within the last 30 days:  No Current discharge risk:  None Barriers to Discharge:  No Barriers Identified   Wende Neighbors, LCSW 03/01/2016, 1:13 PM

## 2016-03-01 NOTE — Progress Notes (Signed)
Pharmacy Antibiotic Note  [name] Marks is a 61 y.o. female admitted on 02/15/2016 with bacteremia and AV fistula graft infection. ESRD on HD MWF (on schedule, tolerating well). Pt is afebrile but WBC improved to 13.5. She continues on rifampin and cefazolin, planning for 6 weeks of therapy.   Plan: Continue Cefazolin 2g IV after HD MWF q1800  Monitor clinical status, HD tolerance, changes in HD schedule  Height: 5\' 1"  (154.9 cm) Weight: 218 lb 11.1 oz (99.2 kg) IBW/kg (Calculated) : 47.8  Temp (24hrs), Avg:98.3 F (36.8 C), Min:97.5 F (36.4 C), Max:98.8 F (37.1 C)   Recent Labs Lab 02/26/16 1630 02/26/16 1707 02/27/16 0500 02/28/16 0430 02/29/16 0300 03/01/16 0310  WBC 21.7*  --  19.8* 17.7* 16.5* 13.5*  CREATININE 2.42* 0.30* 3.48* 3.31* 4.52* 3.97*    Estimated Creatinine Clearance: 16.1 mL/min (by C-G formula based on SCr of 3.97 mg/dL (H)).    Allergies  Allergen Reactions  . Contrast Media [Iodinated Diagnostic Agents] Anaphylaxis  . Naproxen Sodium Itching   Antimicrobials this admission:  Vanc 12/21 >>12/22 Cefazolin 12/21>>(2/3)    *missed dose on 12/22, 12/31 Rifampin 12/23>>(2/3)  Dose adjustments this admission: 12/23- adjusted Ancef dose/schedule d/t missed dose and off-schedule HD   Microbiology results:  12/20 BCx: staph lugdenensis 12/21 MRSA PCR: negative  12/22 WCx: staph lugdunensis 12/22 BCx: staph lugdunensis 12/23 BCx: NG F 12/25 BCx: neg 12/30 tissue: staph lugdunensis (I-cipro, S-all others   Elicia Lamp, PharmD, BCPS Clinical Pharmacist 03/01/2016 10:41 AM

## 2016-03-01 NOTE — Progress Notes (Signed)
5 Days Post-Op Procedure(s) (LRB): AORTIC VALVE REPLACEMENT (AVR) implanted with Magna Ease Aortic valve size 22mm (N/A) TRANSESOPHAGEAL ECHOCARDIOGRAM (TEE) (N/A) Subjective: Some incisional pain, gets off balance when she stands up  Objective: Vital signs in last 24 hours: Temp:  [97.5 F (36.4 C)-98.8 F (37.1 C)] 98.2 F (36.8 C) (01/04 0812) Pulse Rate:  [77-91] 77 (01/04 0700) Cardiac Rhythm: Normal sinus rhythm (01/04 0420) Resp:  [18-47] 20 (01/04 0700) BP: (83-123)/(32-63) 91/56 (01/04 0700) SpO2:  [95 %-99 %] 99 % (01/04 0700) Weight:  [218 lb 11.1 oz (99.2 kg)-218 lb 14.7 oz (99.3 kg)] 218 lb 11.1 oz (99.2 kg) (01/04 0500)  Hemodynamic parameters for last 24 hours:    Intake/Output from previous day: 01/03 0701 - 01/04 0700 In: 895 [I.V.:220; Blood:335; IV Piggyback:100] Out: 2015  Intake/Output this shift: No intake/output data recorded.  General appearance: alert, cooperative and no distress Neurologic: intact Heart: regular rate and rhythm Lungs: diminished breath sounds bibasilar Abdomen: normal findings: soft, non-tender Wound: clean and dry  Lab Results:  Recent Labs  02/29/16 0300 03/01/16 0310  WBC 16.5* 13.5*  HGB 7.8* 8.5*  HCT 23.3* 26.1*  PLT 281 280   BMET:  Recent Labs  02/29/16 0300 03/01/16 0310  NA 133* 134*  K 3.9 3.9  CL 99* 100*  CO2 27 26  GLUCOSE 76 78  BUN 17 15  CREATININE 4.52* 3.97*  CALCIUM 9.5 9.3    PT/INR: No results for input(s): LABPROT, INR in the last 72 hours. ABG    Component Value Date/Time   PHART 7.430 02/25/2016 2142   HCO3 21.1 02/25/2016 2142   TCO2 32 02/26/2016 1707   ACIDBASEDEF 3.0 (H) 02/25/2016 2142   O2SAT 97.0 02/25/2016 2142   CBG (last 3)   Recent Labs  02/29/16 2354 03/01/16 0416 03/01/16 0808  GLUCAP 84 79 69    Assessment/Plan: S/P Procedure(s) (LRB): AORTIC VALVE REPLACEMENT (AVR) implanted with Magna Ease Aortic valve size 37mm (N/A) TRANSESOPHAGEAL ECHOCARDIOGRAM  (TEE) (N/A) -  CV- AVR for endocarditis   Tissue valve per patient preference. No coumadin unless atrial fib, continue with ASA  Will dc metoprolol for now as it is being held for BP most of the time anyway  ID- on ancef and rifampin per ID, plan is for 6 weeks  She will need iV access. Not a PICC candidate, will ask IR to place tunneled central line  RESP- IS for atelectasis  RENAL- HD yesterday  ENDO- CBG normal  Deconditioning- slowly improving, continue PT  transfer to [facility] when bed available   LOS: 15 days    Melrose Nakayama 03/01/2016

## 2016-03-02 ENCOUNTER — Inpatient Hospital Stay (HOSPITAL_COMMUNITY): Payer: Medicare Other

## 2016-03-02 LAB — CBC
HEMATOCRIT: 28.8 % — AB (ref 36.0–46.0)
HEMOGLOBIN: 9.2 g/dL — AB (ref 12.0–15.0)
MCH: 28.3 pg (ref 26.0–34.0)
MCHC: 31.9 g/dL (ref 30.0–36.0)
MCV: 88.6 fL (ref 78.0–100.0)
Platelets: 301 10*3/uL (ref 150–400)
RBC: 3.25 MIL/uL — AB (ref 3.87–5.11)
RDW: 18.9 % — ABNORMAL HIGH (ref 11.5–15.5)
WBC: 11.9 10*3/uL — AB (ref 4.0–10.5)

## 2016-03-02 LAB — BASIC METABOLIC PANEL
ANION GAP: 11 (ref 5–15)
BUN: 22 mg/dL — ABNORMAL HIGH (ref 6–20)
CHLORIDE: 99 mmol/L — AB (ref 101–111)
CO2: 23 mmol/L (ref 22–32)
Calcium: 9.6 mg/dL (ref 8.9–10.3)
Creatinine, Ser: 5.26 mg/dL — ABNORMAL HIGH (ref 0.44–1.00)
GFR calc non Af Amer: 8 mL/min — ABNORMAL LOW (ref >60)
GFR, EST AFRICAN AMERICAN: 9 mL/min — AB (ref >60)
Glucose, Bld: 66 mg/dL (ref 65–99)
POTASSIUM: 4 mmol/L (ref 3.5–5.1)
SODIUM: 133 mmol/L — AB (ref 135–145)

## 2016-03-02 MED ORDER — SODIUM CHLORIDE 0.9% FLUSH: 10.0000 mL | INTRAVENOUS | Status: DC | PRN

## 2016-03-02 MED ORDER — HEPARIN SODIUM (PORCINE) 1000 UNIT/ML DIALYSIS: 20.0000 [IU]/kg | INTRAMUSCULAR | Status: DC | PRN

## 2016-03-02 MED ORDER — MIDODRINE HCL 5 MG PO TABS
ORAL_TABLET | ORAL | Status: AC
  Filled 2016-03-02: qty 2

## 2016-03-02 NOTE — Discharge Instructions (Signed)
End-Stage Kidney Disease °End-stage kidney disease occurs when the kidneys are so damaged that they cannot do their job. The kidneys are two organs that do many important jobs in the body, which include: °· Removing wastes and extra fluids from the blood. °· Making hormones that maintain the amount of fluid in your tissues and blood vessels. °· Maintaining the right amount of fluids and chemicals in the body. ° °When the kidneys are damaged and cannot do their job, life-threatening problems occur. Without the help of the kidneys, toxins build up in the blood. In end-stage kidney disease, the kidneys cannot get better. °What are the causes? °End-stage kidney disease usually occurs when a long-lasting (chronic) kidney disease gets worse. It may also occur after the kidneys are suddenly damaged (acute kidney injury). °What increases the risk? °This condition is more likely to develop in people who are: °· Older than age 60. °· Female. °· Of African-American descent. °· Current smokers or former smokers. °· Obese. ° °You may also have an increased risk for end-stage kidney disease if you: °· Have a family history of chronic kidney disease (CKD). °· Have had kidney disease for many years. °· Have other longstanding medical conditions that affect the kidneys, such as: °? Cardiovascular disease including high blood pressure. °? Diabetes. °? Certain diseases that affect the immune system. ° °What are the signs or symptoms? °· Swelling (edema) of the face, legs, ankles, or feet. °· Numbness, tingling, or loss of feeling (sensation) in your hands or feet. °· Tiredness (lethargy). °· Nausea or vomiting. °· Confusion, trouble concentrating, or loss of consciousness. °· Chest pain. °· Shortness of breath. °· Little to no urine production. °· Muscle twitches and cramps, especially in the legs. °· Constant itchiness. °· Loss of appetite. °· Pale skin and tissue lining your eyelids (conjunctiva). °· Headaches. °· Abnormally dark or  light skin. °· Decrease in muscle size (muscle wasting). °· Easy bruising. °· Frequent hiccups. °· Stopping of menstruation in women. °· Seizures. °How is this diagnosed? °Your health care provider will measure your blood pressure and do some tests. These may include: °· Urine tests. °· Blood tests. °· Imaging tests. °· A test in which a sample of tissue is removed from the kidneys to be looked at under a microscope (kidney biopsy). ° °How is this treated? °There are two treatments for end-stage kidney disease: °· A procedure that removes toxic wastes from the body (dialysis). Depending on the type of dialysis you choose, it may be performed more than one time a day (peritoneal dialysis) or several times a week (hemodialysis). °· Surgery to receive a new kidney (kidney transplant). ° °In addition to having dialysis or a kidney transplant, you may need to take medicines: °· To control high blood pressure (hypertension). °· To control cholesterol. °· To maintain healthy electrolyte levels in your blood. ° °You may also be given a specific diet to follow that includes requirements or limits for: °· Salt (sodium). °· Protein. °· Phosphorous. °· Potassium. °· Calcium. ° °Follow these instructions at home: °· Follow your prescribed diet. °· Take over-the-counter and prescription medicines only as told by your health care provider. °? Do not take any new medicines unless approved by your health care provider. Many medicines can worsen your kidney damage. °? Do not take any vitamin and mineral supplements unless approved by your health care provider. Many nutritional supplements can worsen your kidney damage. °? The dose of some medicines that you take may need to be   adjusted. °· Do not use any tobacco products, such as cigarettes, chewing tobacco, and e-cigarettes. If you need help quitting, ask your health care provider. °· Keep all follow-up visits as told by your health care provider. This is important. °· Keep track of  your blood pressure. Report changes in your blood pressure as told by your health care provider. °· Achieve and maintain a healthy weight. If you need help with this, ask your health care provider. °· Start or continue an exercise plan. Try to exercise at least 30 minutes a day, 5 days a week. °· Stay current with immunizations as told by your health care provider. °Where to find more information: °· American Association of Kidney Patients: www.aakp.org °· National Kidney Foundation: www.kidney.org °· American Kidney Fund: www.akfinc.org °· [employer] [facility]: www.lifeoptions.org and www.kidneyschool.org °Contact a health care provider if: °· Your symptoms get worse. °· You develop new symptoms. °Get help right away if: °· You have weakness in an arm or leg on one side of your body. °· You have difficulty speaking or you are slurring your speech. °· You have a sudden change in your vision. °· You have a sudden, severe headache. °· You have a sudden weight increase. °· You have difficulty breathing. °· Your symptoms suddenly get worse. °This information is not intended to replace advice given to you by your health care provider. Make sure you discuss any questions you have with your health care provider. °Document Released: 05/05/2003 Document Revised: 07/21/2015 Document Reviewed: 10/12/2011 °Elsevier Interactive Patient Education © 2017 Elsevier Inc. ° °

## 2016-03-02 NOTE — Progress Notes (Signed)
PT Cancellation Note  Patient Details Name: [name] Marks MRN: [id number] DOB: 11-17-1955   Cancelled Treatment:    Reason Eval/Treat Not Completed: Patient at procedure or test/unavailable (pt currently in HD and unavailable. Will attempt later if time allows)   Konner Warrior B Ed Mandich 03/02/2016, 8:18 AM Elwyn Reach, [place]

## 2016-03-02 NOTE — Progress Notes (Addendum)
      [address]       [place], [address]             [phone]        6 Days Post-Op Procedure(s) (LRB): AORTIC VALVE REPLACEMENT (AVR) implanted with Magna Ease Aortic valve size 2mm (N/A) TRANSESOPHAGEAL ECHOCARDIOGRAM (TEE) (N/A)  Subjective: She is just finishing HD. She states she has sternal incisional pain and neck pain.  Objective: Vital signs in last 24 hours: Temp:  [98 F (36.7 C)-98.5 F (36.9 C)] 98 F (36.7 C) (01/05 0720) Pulse Rate:  [77-105] 80 (01/05 1030) Cardiac Rhythm: Normal sinus rhythm (01/05 0703) Resp:  [18-25] 25 (01/05 1030) BP: (83-122)/(45-78) 85/51 (01/05 1030) SpO2:  [99 %] 99 % (01/05 0720) Weight:  [218 lb 0.6 oz (98.9 kg)-219 lb 12.8 oz (99.7 kg)] 219 lb 12.8 oz (99.7 kg) (01/05 0720)  Pre op weight 103.7 kg Current Weight  03/02/16 219 lb 12.8 oz (99.7 kg)      Intake/Output from previous day: 01/04 0701 - 01/05 0700 In: 20 [I.V.:20] Out: 1 [Stool:1]   Physical Exam:  Cardiovascular: RRR, no murmur Pulmonary: Slightly diminished at bases Abdomen: Soft, non tender, bowel sounds present. Extremities: Mild bilateral lower extremity edema. Wounds: Clean and dry.  No erythema or signs of infection.  Lab Results: CBC: Recent Labs  03/01/16 0310 03/02/16 0400  WBC 13.5* 11.9*  HGB 8.5* 9.2*  HCT 26.1* 28.8*  PLT 280 301   BMET:  Recent Labs  03/01/16 0310 03/02/16 0400  NA 134* 133*  K 3.9 4.0  CL 100* 99*  CO2 26 23  GLUCOSE 78 66  BUN 15 22*  CREATININE 3.97* 5.26*  CALCIUM 9.3 9.6    PT/INR:  Lab Results  Component Value Date   INR 2.05 02/25/2016   INR 1.45 02/24/2016   INR 1.16 02/15/2016   ABG:  INR: Will add last result for INR, ABG once components are confirmed Will add last 4 CBG results once components are confirmed  Assessment/Plan:  1. 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 SR in the 80's. On Midodrine 10 mg tid as BP has been  labile. 2.  Pulmonary - On room air. Encourage incentive spirometer 3. ESRD-Creatinine up to 5.26. She is almost finished with HD this am. Nephrology following. 4. ID on Cefazolin and Rifampin for Methicillin sensitive coag negative Staph bacteremia . She needs a total of 6 weeks of IV antibiotics 4.  Anemia - H and H stable at 9.2 and 28.8 this am. Given Aranesp 5. Secondary hyperparathyroidism-On Sensipar and Sevelamer 6. At patient request, heating pad to neck PRN 7. Remove EPW in am  ZIMMERMAN,DONIELLE MPA-C 03/02/2016,11:54 AM Feels poorly after HD Note plan to give antibiotics at time of HD, will not need tunneled catheter.  Revonda Standard Roxan Hockey, MD [facility] [phone]

## 2016-03-02 NOTE — Discharge Summary (Signed)
Physician Discharge Summary  Patient ID: [name] Marks MRN: [id number] DOB/AGE: 1955-03-28 61 y.o.  Admit date: 02/15/2016 Discharge date: 03/06/2016  Admission Diagnoses:Altered mental status  Infected hemodialysis access graft  End stage renal disease on hemodialysis  Discharge Diagnoses:  Principal Problem:   Infection of AV graft for dialysis [facility]) Active Problems:   Altered mental status   Anemia   Bacteremia   Septic shock (HCC)   Encephalopathy   S/P dialysis catheter insertion (HCC)   Bilateral low back pain with sciatica   Neck pain   Endocarditis of native valve   End stage renal disease on dialysis ([place])   Staphylococcus aureus bacteremia   Severe aortic insufficiency   Bacteremia due to coagulase-negative Staphylococcus   Aortic valve vegetation   S/P AVR  Patient Active Problem List   Diagnosis Date Noted  . S/P AVR 02/25/2016  . Aortic valve vegetation   . Severe aortic insufficiency   . Bacteremia due to coagulase-negative Staphylococcus   . S/P dialysis catheter insertion ([place])   . Bilateral low back pain with sciatica   . Neck pain   . Endocarditis of native valve   . End stage renal disease on dialysis ([place])   . Staphylococcus aureus bacteremia   . Anemia   . Infection of AV graft for dialysis ([place])   . Bacteremia   . Septic shock ([place])   . Encephalopathy   . Altered mental status 02/15/2016  . Renal dialysis device, implant, or graft complication 40/98/1191  . Pain and swelling of right upper extremity 12/20/2015  . Complex endometrial hyperplasia with atypia 11/08/2015  . Increased endometrial stripe thickness 11/03/2015  . Obesity 10/28/2014  . Shoulder pain, bilateral 10/28/2014  . Health care maintenance 10/28/2014  . Hypotension, unspecified 01/14/2013  . Right carotid bruit 01/08/2013  . Generalized headaches 01/08/2013  . Chronic female pelvic pain 08/08/2012  . Gout, unspecified 12/06/2008  . Esophageal reflux 12/06/2008  . BACK  PAIN 12/06/2008  . FIBROIDS, UTERUS 11/24/2008  . ESRD (end stage renal disease) on dialysis ([place]) 07/01/2006   History of Present Illness: at time of admission    [name] Marks is a 61 y.o. female presenting with alterd mental status  History is provided by her daughters  The patient was last noted to be normal this AM when her son was taking her to dialysis. During the trip she was noted to stop talking to him. At dialysis she did not speak, nor did she follow commands. The Dialysis center then called EMS. Per her daughter, she did not have recent chest pain, SOB, fevers. Of note she was recently treated for right AV fistula infection. Her daughter is not sure when that happened or if she completed her antibiotics In the ED she was largely stable. One provider noted left sided mouth droop that lasted <3 minutes. She was noted to be moving all extremities at that time.  Prior to this, the patient lived by herself and cared for herself. Her family denies her ever having any similar symptoms in past.    Discharged Condition: fair  Hospital Course: The patient presented to the emergency department with altered mental status. She was seen by the family medicine teaching service and felt to require admission for further evaluation and management. Nephrology was consulted and she is in end-stage renal dialysis patient for further management during the hospitalization. She was found to have some AV fistula bleeding and vascular surgery was also consulted. She was noted to  have a recent AV fistula infection. Vascular surgery initially recommended close follow-up without acute need for any surgical intervention but it was later determined that it would require removal of infected portion of the graft. She was found to have methicillin sensitive coagulase-negative staph bacteremia due to the graft as well as in infectious disease service was also consulted to assist with antibiotic management. She was  also found to be anemic and required transfusion. On 02/17/2016 Dr. Doren Custard did the below described procedure. Additionally on 02/19/2016 a ultrasound guided left IJ tunnel dialysis catheter was placed. Due to the bacteremia it was felt the patient would require a TEE and ultimately was determined that the patient had aortic valve endocarditis. Cardiology was also consulted to assist with management. S lugdenensis bacteremia was documented.  Dr. Roxan Hockey was consulted for cardiac surgery. Cardiac catheterization was done which showed normal coronary arteries. Valve replacement was recommended.   On 02/26/2016 patient underwent the below described procedure for aortic valve replacement. She tolerated it well was taken to the surgical ICU in stable condition.  Postoperative hospital course:  Overall the patient has progressed nicely. She did require some early inotropic support. She was continued on Ancef and Rifampin for staph endocarditis. This will require a 6 week postoperative course. Blood sugars were under adequate control. She is followed by nephrology for hemodialysis. She had a acute expected blood loss anemia which has been followed closely clinically. She will not require Coumadin for her pericardial valve she has atrial fibrillation. She is significantly deconditioned and at time of discharge will require skilled nursing home placement.  Consults: cardiology, ID, nephrology and vascular surgery  Significant Diagnostic Studies: angiography: cardiac cath, TEE  Treatments: surgery:     NAME: [name] Marks                                MRN: [id number] DOB: 1956/01/10                                              DATE OF OPERATION: 02/17/2016  PREOP DIAGNOSIS: Infected right upper arm graft  POSTOP DIAGNOSIS: Same  PROCEDURE:  1. Removal of infected right upper arm graft 2. Bovine pericardial patch angioplasty of right brachial artery 3. Removal of old right forearm  graft  SURGEON: Judeth Cornfield. Scot Dock, MD, FACS  ASSIST: Silva Bandy, PAC, Leontine Locket, [place]  ANESTHESIA: Gen.   EBL: 100 cc   DATE OF PROCEDURE:  02/25/2016 DATE OF DISCHARGE:                              OPERATIVE REPORT   PREOPERATIVE DIAGNOSIS:  Severe aortic insufficiency due to endocarditis.  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  PROCEDURE:  Median sternotomy, extracorporeal circulation, aortic valve replacement with 21 mm [facility] Ease pericardial valve, model #3300 TFX, serial [id number].  SURGEON:  Modesto Charon, M.D.  ASSISTANT:  John Giovanni, P.A.-C.  ANESTHESIA:  General.  FINDINGS:  Transesophageal echocardiography revealed severe aortic insufficiency with vegetations on the right and non-coronary cusps. There was moderate mitral regurgitation and moderate tricuspid regurgitation.  There was preserved left ventricular function.  Disposition: 01-Home or Self Care  Discharge Medications:  Allergies as of 03/06/2016  Reactions   Contrast Media [iodinated Diagnostic Agents] Anaphylaxis   Naproxen Sodium Itching      Medication List    STOP taking these medications   doxycycline 100 MG tablet Commonly known as:  VIBRA-TABS   ibuprofen 600 MG tablet Commonly known as:  ADVIL,MOTRIN   naproxen 500 MG tablet Commonly known as:  NAPROSYN     TAKE these medications   aspirin 325 MG EC tablet Take 1 tablet (325 mg total) by mouth daily. What changed:  medication strength  how much to take   ceFAZolin 2-4 GM/100ML-% IVPB Commonly known as:  ANCEF Inject 100 mLs (2 g total) into the vein every Monday, Wednesday, and Friday at 6 PM. During dialysis Start taking on:  03/07/2016   cinacalcet 60 MG tablet Commonly known as:  SENSIPAR Take 60 mg by mouth at bedtime.   diclofenac sodium 1 % Gel Commonly known as:  VOLTAREN Apply 1 application topically 4 (four) times daily. Prn  neck muscle pain.   famotidine 20 MG tablet Commonly known as:  PEPCID Take 20 mg by mouth daily.   megestrol 40 MG tablet Commonly known as:  MEGACE Take 1 tablet (40 mg total) by mouth 2 (two) times daily.   midodrine 10 MG tablet Commonly known as:  PROAMATINE Take 10 mg by mouth 2 (two) times daily.   oxyCODONE 5 MG immediate release tablet Commonly known as:  Oxy IR/ROXICODONE Take 1-2 tablets (5-10 mg total) by mouth every 3 (three) hours as needed for severe pain.   RENVELA 800 MG tablet Generic drug:  sevelamer carbonate Take 1,600 mg by mouth 3 (three) times daily with meals.   rifampin 300 MG capsule Commonly known as:  RIFADIN Take 1 capsule (300 mg total) by mouth every 12 (twelve) hours.   silver sulfADIAZINE 1 % cream Commonly known as:  SILVADENE Apply 1 application topically 2 (two) times daily as needed (wound).      Follow-up Information    [facility] Follow up in 1 week(s).   Specialty:  Vascular Surgery Why:  Office will call you to arrange your appt (sent) Contact information: [place] [phone]       Melrose Nakayama, MD Follow up on 04/03/2016.   Specialty:  Cardiothoracic Surgery Why:  Appointment is at 11:45, please get CXR at [facility] imaging at 11:15, located on first floor of our office building Contact information: [place] [address] [phone]        [facility] Follow up.   Why:  dialysis as arranged Contact information: [place] [address] [phone]        Charlie Pitter, PA-C Follow up on 03/15/2016.   Specialties:  Cardiology, Radiology Why:  Appointment is at 9:30 Contact information: [place] [address] [phone]        [facility]              .   Contact information: [address] [place]  [place] [phone]         1. Please obtain vital signs at least one time daily 2.Please weigh the patient daily. If he or she continues to gain weight or develops lower extremity edema, contact the office at [phone]. 3. Ambulate patient at least three times daily and please use sternal precautions.   The patient has been  discharged on:   1.Beta Blocker:  Yes [   ]                              No   [  n ]                              If No, reason:bp too low  2.Ace Inhibitor/ARB: Yes [   ]                                     No  [ n   ]                                     If No, reason:bp tool ow, ESRD  3.Statin:   Yes [   ]                  No  [ n  ]                  If No, reason:no cad  4.Shela CommonsVelta Addison  [ y  ]                  No   [   ]                  If No, reason:    Signed: BARRETT, ERIN 03/06/2016, 10:51 AM

## 2016-03-02 NOTE — Procedures (Signed)
I was present at this dialysis session. I have reviewed the session itself and made appropriate changes.   3K bath, UF goal 3L.  BP stable. Pt w/o complaint.  I think we can do ABX at HD and avoid placing powerline.  She is adherent to outpt HD.      Filed Weights   03/01/16 0500 03/02/16 0531 03/02/16 0720  Weight: 99.2 kg (218 lb 11.1 oz) 98.9 kg (218 lb 0.6 oz) 99.7 kg (219 lb 12.8 oz)     Recent Labs Lab 03/01/16 0310 03/02/16 0400  NA 134* 133*  K 3.9 4.0  CL 100* 99*  CO2 26 23  GLUCOSE 78 66  BUN 15 22*  CREATININE 3.97* 5.26*  CALCIUM 9.3 9.6  PHOS 2.9  --      Recent Labs Lab 02/29/16 0300 03/01/16 0310 03/02/16 0400  WBC 16.5* 13.5* 11.9*  HGB 7.8* 8.5* 9.2*  HCT 23.3* 26.1* 28.8*  MCV 86.0 86.4 88.6  PLT 281 280 301    Scheduled Meds: . aspirin EC  325 mg Oral Daily   Or  . aspirin  324 mg Per Tube Daily  . bisacodyl  10 mg Oral Daily   Or  . bisacodyl  10 mg Rectal Daily  .  ceFAZolin (ANCEF) IV  2 g Intravenous Q M,W,F-1800  . cinacalcet  60 mg Oral QPM  . darbepoetin (ARANESP) injection - DIALYSIS  200 mcg Intravenous Q Wed-HD  . enoxaparin (LOVENOX) injection  30 mg Subcutaneous QHS  . mouth rinse  15 mL Mouth Rinse BID  . midodrine  10 mg Oral TID WC  . rifampin  300 mg Oral Q12H  . sevelamer carbonate  1,600 mg Oral TID WC  . sodium chloride flush  3 mL Intravenous Q12H   Continuous Infusions: PRN Meds:.sodium chloride, alteplase, heparin, heparin, lidocaine (PF), lidocaine-prilocaine, ondansetron (ZOFRAN) IV, oxyCODONE, pentafluoroprop-tetrafluoroeth, sodium chloride flush, sodium chloride flush, traMADol   [name]  MD 03/02/2016, 9:04 AM

## 2016-03-03 NOTE — Progress Notes (Signed)
[name] Progress Note   Background: 61 yo AAF admitted with infection/bleeding from RUE AVG, + BC's staph lugdenensis, aortic valve endocarditis, s/p removal of infected AVG 12/22 and AoV replacement 02/25/16.   Dialysis Orders: MWF at [facility]  4h  99.5kg  2/2 bath  RUE AVG (now removed)/ new L IJ cath  Hep 6000 w 2500 midrun - Mircera 268mcg IV q 2 weeks (last given 12/13) - Calcitriol 0.37mcg PO q HD  Assessment: 1. Staph lugdenensis bacteremia w AVG infection: R AVG resected 12/22. Current plan is IV Ancef + po rifampin for 6 weeks (antistaph ATB's initiated 12/21-has had vanco->ancef) -- end date 2/1? 2. Aortic valve endocarditis (staph lugdenensis) - sp AoVR 02/25/16. Ancef and rifampin for 6 weeks as above.  3. Vol excess: WIll have lower EDW at DC, cont to probe 4. ESRD: MWF HD.  5. Hypotension: Midodrine increased to 10 TID.  6. Anemia of CKD/ABL: recent vaginal/ AVG bleeding. Hgb  7.6. s/p 4U prbc's since admission. Tsat low, but holding off on IV iron in setting of bacteremia. Aranesp 190mcg resumed 12/27, increased to 251mcg weekly 1/3.  7. Secondary hyperparathyroidism: Calcitrol/sensipar and sevelamer. 8. Endometrial hyperplasia: Megace on hold.   Plan - HD on MWF schedule.  Will cont cefazolin with HD upon discharge.  Cont to gently challenge post weights.     [name], MD [facility] [phone] Pager 02/29/2016, 10:25 AM   Subjective:   HD yesterday, post weight 98kg, VSS, AF    Objective Vitals:   03/02/16 1127 03/02/16 1257 03/02/16 2039 03/03/16 0614  BP: (!) 100/51 (!) 107/57 (!) 104/53 (!) 107/48  Pulse: 84 90 83 87  Resp: 20 18 18 18   Temp: 98 F (36.7 C) 98.1 F (36.7 C) 98.5 F (36.9 C) 98.7 F (37.1 C)  TempSrc: Oral Oral Oral Oral  SpO2: 100% 100% 100% 100%  Weight: 98 kg (216 lb 0.8 oz)   100.9 kg (222 lb 7.1 oz)  Height:       Physical Exam Awake, alert, oriented On dialysis Sternal dressing  intact.  L TDC clean and dry (12/22). R IJ central line Regular rhythm [id number] No S3 2/6 systolic murmur USB Abd obese, not tender Ext's with SCD's in place No LE edema noted R arm with extensive incisions upper and lower arm - healing - sutures were removed today   Recent Labs Lab 02/28/16 0430 02/29/16 0300 03/01/16 0310 03/02/16 0400  NA 132* 133* 134* 133*  K 3.4* 3.9 3.9 4.0  CL 95* 99* 100* 99*  CO2 26 27 26 23   GLUCOSE 93 76 78 66  BUN 8 17 15  22*  CREATININE 3.31* 4.52* 3.97* 5.26*  CALCIUM 9.3 9.5 9.3 9.6  PHOS 3.0  --  2.9  --      Recent Labs Lab 02/28/16 0430 02/29/16 0300 03/01/16 0310  AST  --  23  --   ALT  --  <5*  --   ALKPHOS  --  111  --   BILITOT  --  0.4  --   PROT  --  5.8*  --   ALBUMIN 2.1* 1.9* 1.9*   CBC:  Recent Labs Lab 02/27/16 0500 02/28/16 0430 02/29/16 0300 03/01/16 0310 03/02/16 0400  WBC 19.8* 17.7* 16.5* 13.5* 11.9*  HGB 8.2* 7.6* 7.8* 8.5* 9.2*  HCT 24.5* 22.8* 23.3* 26.1* 28.8*  MCV 84.5 84.8 86.0 86.4 88.6  PLT 296 278 281 280 301    Blood Culture  Component Value Date/Time   SDES TISSUE 02/25/2016 1000   SPECREQUEST AORTIC VALVE LEAFLET POF ZINACEF AND VANC 02/25/2016 1000   CULT  02/25/2016 1000    FEW STAPHYLOCOCCUS LUGDUNENSIS NO ANAEROBES ISOLATED    REPTSTATUS 03/01/2016 FINAL 02/25/2016 1000   Medications:  . aspirin EC  325 mg Oral Daily   Or  . aspirin  324 mg Per Tube Daily  . bisacodyl  10 mg Oral Daily   Or  . bisacodyl  10 mg Rectal Daily  .  ceFAZolin (ANCEF) IV  2 g Intravenous Q M,W,F-1800  . cinacalcet  60 mg Oral QPM  . darbepoetin (ARANESP) injection - DIALYSIS  200 mcg Intravenous Q Wed-HD  . enoxaparin (LOVENOX) injection  30 mg Subcutaneous QHS  . mouth rinse  15 mL Mouth Rinse BID  . midodrine  10 mg Oral TID WC  . rifampin  300 mg Oral Q12H  . sevelamer carbonate  1,600 mg Oral TID WC  . sodium chloride flush  3 mL Intravenous Q12H

## 2016-03-03 NOTE — Progress Notes (Signed)
EPWs removed per order and unit protocol.  Sites unremarkable, all tips intact.  Pt understands bedrest for one hr with frequent VS checks.  CCMD notified will monitor closely.

## 2016-03-03 NOTE — Progress Notes (Signed)
CARDIAC REHAB PHASE I   PRE:  Rate/Rhythm: 83  BP:  Sitting: 105/58     SaO2: 96ra  MODE:  Ambulation: 60 ft   POST:  Rate/Rhythm: 119  BP:  Sitting: 151/68     SaO2: 94ra  1:35pm-2:00pm Patient walked with x2 assist and walker. Patient did not need to stop for a rest break. Was short of breath. Back to chair with call bell in reach.   [place], MS 03/03/2016 1:56 PM

## 2016-03-03 NOTE — Progress Notes (Addendum)
      [address]       [place] [address]             [phone]      7 Days Post-Op Procedure(s) (LRB): AORTIC VALVE REPLACEMENT (AVR) implanted with Magna Ease Aortic valve size 29mm (N/A) TRANSESOPHAGEAL ECHOCARDIOGRAM (TEE) (N/A) Subjective: Feels okay this morning. Having some incisional pain  Objective: Vital signs in last 24 hours: Temp:  [98 F (36.7 C)-98.7 F (37.1 C)] 98.7 F (37.1 C) (01/06 0614) Pulse Rate:  [77-90] 87 (01/06 0614) Cardiac Rhythm: Normal sinus rhythm (01/05 1900) Resp:  [18-25] 18 (01/06 0614) BP: (83-107)/(48-59) 107/48 (01/06 0614) SpO2:  [100 %] 100 % (01/06 0614) Weight:  [216 lb 0.8 oz (98 kg)-222 lb 7.1 oz (100.9 kg)] 222 lb 7.1 oz (100.9 kg) (01/06 1505)     Intake/Output from previous day: 01/05 0701 - 01/06 0700 In: 240 [P.O.:240] Out: 1100  Intake/Output this shift: No intake/output data recorded.  General appearance: alert, cooperative and no distress Heart: regular rate and rhythm Lungs: clear to auscultation bilaterally Extremities: RUE swelling. generalized edema in bilateral upper extremity Wound: clean and dry  Lab Results:  Recent Labs  03/01/16 0310 03/02/16 0400  WBC 13.5* 11.9*  HGB 8.5* 9.2*  HCT 26.1* 28.8*  PLT 280 301   BMET:  Recent Labs  03/01/16 0310 03/02/16 0400  NA 134* 133*  K 3.9 4.0  CL 100* 99*  CO2 26 23  GLUCOSE 78 66  BUN 15 22*  CREATININE 3.97* 5.26*  CALCIUM 9.3 9.6    PT/INR: No results for input(s): LABPROT, INR in the last 72 hours. ABG    Component Value Date/Time   PHART 7.430 02/25/2016 2142   HCO3 21.1 02/25/2016 2142   TCO2 32 02/26/2016 1707   ACIDBASEDEF 3.0 (H) 02/25/2016 2142   O2SAT 97.0 02/25/2016 2142   CBG (last 3)   Recent Labs  02/29/16 2354 03/01/16 0416 03/01/16 0808  GLUCAP 84 79 69    Assessment/Plan: S/P Procedure(s) (LRB): AORTIC VALVE REPLACEMENT (AVR) implanted with Magna Ease Aortic valve size 56mm  (N/A) TRANSESOPHAGEAL ECHOCARDIOGRAM (TEE) (N/A)   1. 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 SR in the 80's. On Midodrine 10 mg tid as BP has been labile. EPW out this am.  2.  Pulmonary - On room air. Encourage incentive spirometer 3. ESRD-Creatinine up to 5.26 yesterday. HD yesterday. Nephrology following. 4. ID on Cefazolin and Rifampin for Methicillin sensitive coag negative Staph bacteremia . She needs a total of 6 weeks of IV antibiotics 4.  Anemia - H and H stable. Given Aranesp 5. Secondary hyperparathyroidism-On Sensipar and Sevelamer 6. RUE edema. Keep elevated and use heating pad 7. Access-subclavian right central line-discontinue? Not a PICC line candidate. Giving antibiotics at the time of HD. Unlikely able to get peripheral access.   Plan: HD on Monday per Nephrology. Continue to mobilize TID. Encouraged incentive spirometry. Discontinue EPW.    LOS: 17 days    Elgie Collard 03/03/2016 Continue central line until patient discharged for antibiotic administration patient examined and medical record reviewed,agree with above note. Tharon Aquas Trigt III 03/03/2016

## 2016-03-04 NOTE — Progress Notes (Signed)
[name] Progress Note   Background: 61 yo AAF admitted with infection/bleeding from RUE AVG, + BC's staph lugdenensis, aortic valve endocarditis, s/p removal of infected AVG 12/22 and AoV replacement 02/25/16.   Dialysis Orders: MWF at [facility]  4h  99.5kg  2/2 bath  RUE AVG (now removed)/ new L IJ cath  Hep 6000 w 2500 midrun - Mircera 226mcg IV q 2 weeks (last given 12/13) - Calcitriol 0.77mcg PO q HD  Assessment: 1. Staph lugdenensis bacteremia w AVG infection: R AVG resected 12/22. Current plan is IV Ancef + po rifampin for 6 weeks (antistaph ATB's initiated 12/21-has had vanco->ancef) -- end date 2/1? 2. Aortic valve endocarditis (staph lugdenensis) - sp AoVR 02/25/16. Ancef and rifampin for 6 weeks as above.  3. Vol excess: WIll have lower EDW at DC, cont to probe 4. ESRD: MWF HD.  5. Hypotension: Midodrine increased to 10 TID.  6. Anemia of CKD/ABL: recent vaginal/ AVG bleeding. Hgb  7.6. s/p 4U prbc's since admission. Tsat low, but holding off on IV iron in setting of bacteremia. Aranesp 190mcg resumed 12/27, increased to 281mcg weekly 1/3.  7. Secondary hyperparathyroidism: Calcitrol/sensipar and sevelamer. 8. Endometrial hyperplasia: Megace on hold.   Plan -   HD on MWF schedule.  2K, 2Ca, 97.5kg, Hep 6k qTx. TDC  Will cont cefazolin post HD upon discharge from HD unit.    Cont to gently challenge post weights.     [name] Grippe, MD [facility] [phone] Pager 02/29/2016, 10:25 AM   Subjective:   No new events, some N/V with food.  OOB to hall, ambulating    Objective Vitals:   03/03/16 1100 03/03/16 1252 03/03/16 2026 03/04/16 0531  BP: (!) 115/59 (!) 100/46 (!) 112/53 (!) 100/57  Pulse: 78 85 81 85  Resp:  19 19 19   Temp:  98.5 F (36.9 C) 98.2 F (36.8 C) 98.1 F (36.7 C)  TempSrc:  Oral Oral Oral  SpO2:  97% 100% 99%  Weight:    101.2 kg (223 lb 1.7 oz)  Height:       Physical Exam Awake, alert, oriented On  dialysis Sternal dressing intact.  L TDC clean and dry (12/22). R IJ central line Regular rhythm [id number] No S3 2/6 systolic murmur USB Abd obese, not tender Ext's with SCD's in place No LE edema noted R arm with extensive incisions upper and lower arm - healing - sutures were removed today   Recent Labs Lab 02/28/16 0430 02/29/16 0300 03/01/16 0310 03/02/16 0400  NA 132* 133* 134* 133*  K 3.4* 3.9 3.9 4.0  CL 95* 99* 100* 99*  CO2 26 27 26 23   GLUCOSE 93 76 78 66  BUN 8 17 15  22*  CREATININE 3.31* 4.52* 3.97* 5.26*  CALCIUM 9.3 9.5 9.3 9.6  PHOS 3.0  --  2.9  --      Recent Labs Lab 02/28/16 0430 02/29/16 0300 03/01/16 0310  AST  --  23  --   ALT  --  <5*  --   ALKPHOS  --  111  --   BILITOT  --  0.4  --   PROT  --  5.8*  --   ALBUMIN 2.1* 1.9* 1.9*   CBC:  Recent Labs Lab 02/27/16 0500 02/28/16 0430 02/29/16 0300 03/01/16 0310 03/02/16 0400  WBC 19.8* 17.7* 16.5* 13.5* 11.9*  HGB 8.2* 7.6* 7.8* 8.5* 9.2*  HCT 24.5* 22.8* 23.3* 26.1* 28.8*  MCV 84.5 84.8 86.0 86.4 88.6  PLT 296 278 281 280 301    Blood Culture    Component Value Date/Time   SDES TISSUE 02/25/2016 1000   SPECREQUEST AORTIC VALVE LEAFLET POF ZINACEF AND VANC 02/25/2016 1000   CULT  02/25/2016 1000    FEW STAPHYLOCOCCUS LUGDUNENSIS NO ANAEROBES ISOLATED    REPTSTATUS 03/01/2016 FINAL 02/25/2016 1000   Medications:  . aspirin EC  325 mg Oral Daily   Or  . aspirin  324 mg Per Tube Daily  . bisacodyl  10 mg Oral Daily   Or  . bisacodyl  10 mg Rectal Daily  .  ceFAZolin (ANCEF) IV  2 g Intravenous Q M,W,F-1800  . cinacalcet  60 mg Oral QPM  . darbepoetin (ARANESP) injection - DIALYSIS  200 mcg Intravenous Q Wed-HD  . enoxaparin (LOVENOX) injection  30 mg Subcutaneous QHS  . mouth rinse  15 mL Mouth Rinse BID  . midodrine  10 mg Oral TID WC  . rifampin  300 mg Oral Q12H  . sevelamer carbonate  1,600 mg Oral TID WC  . sodium chloride flush  3 mL Intravenous Q12H

## 2016-03-04 NOTE — Progress Notes (Addendum)
      [address]       [place] [address]             [phone]      8 Days Post-Op Procedure(s) (LRB): AORTIC VALVE REPLACEMENT (AVR) implanted with Magna Ease Aortic valve size 24mm (N/A) TRANSESOPHAGEAL ECHOCARDIOGRAM (TEE) (N/A) Subjective: Feels okay this morning. Walked as far as she could yesterday and turned back around. Shares that her legs feel weak.   Objective: Vital signs in last 24 hours: Temp:  [98.1 F (36.7 C)-98.5 F (36.9 C)] 98.1 F (36.7 C) (01/07 0531) Pulse Rate:  [78-85] 85 (01/07 0531) Cardiac Rhythm: Normal sinus rhythm (01/06 1930) Resp:  [19] 19 (01/07 0531) BP: (100-117)/(46-70) 100/57 (01/07 0531) SpO2:  [97 %-100 %] 99 % (01/07 0531) Weight:  [223 lb 1.7 oz (101.2 kg)] 223 lb 1.7 oz (101.2 kg) (01/07 0531)     Intake/Output from previous day: 01/06 0701 - 01/07 0700 In: 480 [P.O.:480] Out: -  Intake/Output this shift: No intake/output data recorded.  General appearance: alert, cooperative and no distress Heart: regular rate and rhythm, S1, S2 normal, no murmur, click, rub or gallop Lungs: clear to auscultation bilaterally Abdomen: soft, non-tender; bowel sounds normal; no masses,  no organomegaly Extremities: upper extremity edema. RUE > LUE Wound: clean and dry  Lab Results:  Recent Labs  03/02/16 0400  WBC 11.9*  HGB 9.2*  HCT 28.8*  PLT 301   BMET:  Recent Labs  03/02/16 0400  NA 133*  K 4.0  CL 99*  CO2 23  GLUCOSE 66  BUN 22*  CREATININE 5.26*  CALCIUM 9.6    PT/INR: No results for input(s): LABPROT, INR in the last 72 hours. ABG    Component Value Date/Time   PHART 7.430 02/25/2016 2142   HCO3 21.1 02/25/2016 2142   TCO2 32 02/26/2016 1707   ACIDBASEDEF 3.0 (H) 02/25/2016 2142   O2SAT 97.0 02/25/2016 2142   CBG (last 3)  No results for input(s): GLUCAP in the last 72 hours.  Assessment/Plan: S/P Procedure(s) (LRB): AORTIC VALVE REPLACEMENT (AVR) implanted with Magna Ease Aortic  valve size 76mm (N/A) TRANSESOPHAGEAL ECHOCARDIOGRAM (TEE) (N/A)  1. 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 SR in the 80's. On Midodrine 10 mg tid as BP has been labile. EPW removed. 2. Pulmonary - On room air. Encourage incentive spirometer 3. ESRD-Last creatinine 5.26. Nephrology following. HD planned for Monday 4. ID on Cefazolin and Rifampin for Methicillin sensitive coag negative Staph bacteremia . She needs a total of 6 weeks of IV antibiotics 4. Anemia - H and H stable. Given Aranesp 5. Secondary hyperparathyroidism-On Sensipar and Sevelamer 6. RUE edema. Keep elevated and use heating pad 7. Keep central line until discharge for antibiotic administration  Plan: consult to PT/OT for increased lower body strength and mobilization. Continue to use incentive spirometer. Continue antibiotics. HD tomorrow. Encouraged ambulation.    LOS: 18 days    Elgie Collard 03/04/2016 Surgical incision clean and dry Plan HD Monday then possible transfer to skilled nursing facility Remove central line prior to discharge from hospital patient examined and medical record reviewed,agree with above note. Tharon Aquas Trigt III 03/04/2016

## 2016-03-04 NOTE — Progress Notes (Signed)
Pharmacy Antibiotic Note  [name] Marks is a 61 y.o. female admitted on 02/15/2016 with bacteremia and AV fistula graft infection. ESRD on HD MWF (on schedule, tolerating well). Pt is afebrile but WBC improved to 11.9. She continues on rifampin and cefazolin, planning for 6 weeks of therapy.   Plan: Continue Cefazolin 2g IV after HD MWF q1800  Monitor clinical status, HD tolerance, changes in HD schedule  Height: 5\' 1"  (154.9 cm) Weight: 223 lb 1.7 oz (101.2 kg) IBW/kg (Calculated) : 47.8  Temp (24hrs), Avg:98.3 F (36.8 C), Min:98.1 F (36.7 C), Max:98.5 F (36.9 C)   Recent Labs Lab 02/27/16 0500 02/28/16 0430 02/29/16 0300 03/01/16 0310 03/02/16 0400  WBC 19.8* 17.7* 16.5* 13.5* 11.9*  CREATININE 3.48* 3.31* 4.52* 3.97* 5.26*    Estimated Creatinine Clearance: 12.3 mL/min (by C-G formula based on SCr of 5.26 mg/dL (H)).    Allergies  Allergen Reactions  . Contrast Media [Iodinated Diagnostic Agents] Anaphylaxis  . Naproxen Sodium Itching   Antimicrobials this admission:  Vanc 12/21 >>12/22 Cefazolin 12/21>>(2/3)    *missed dose on 12/22, 12/31 Rifampin 12/23>>(2/3)  Dose adjustments this admission: 12/23- adjusted Ancef dose/schedule d/t missed dose and off-schedule HD   Microbiology results:  12/20 BCx: staph lugdenensis 12/21 MRSA PCR: negative  12/22 WCx: staph lugdunensis 12/22 BCx: staph lugdunensis 12/23 BCx: NG F 12/25 BCx: neg 12/30 tissue: staph lugdunensis (I-cipro, S-all others   Elicia Lamp, PharmD, BCPS Clinical Pharmacist 03/04/2016 11:59 AM

## 2016-03-05 DIAGNOSIS — Z0000 Encounter for general adult medical examination without abnormal findings: Secondary | ICD-10-CM

## 2016-03-05 DIAGNOSIS — E669 Obesity, unspecified: Secondary | ICD-10-CM

## 2016-03-05 DIAGNOSIS — R7301 Impaired fasting glucose: Secondary | ICD-10-CM

## 2016-03-05 DIAGNOSIS — M25511 Pain in right shoulder: Secondary | ICD-10-CM

## 2016-03-05 LAB — RENAL FUNCTION PANEL
ALBUMIN: 1.9 g/dL — AB (ref 3.5–5.0)
Anion gap: 12 (ref 5–15)
BUN: 22 mg/dL — AB (ref 6–20)
CALCIUM: 9.5 mg/dL (ref 8.9–10.3)
CO2: 25 mmol/L (ref 22–32)
CREATININE: 6.55 mg/dL — AB (ref 0.44–1.00)
Chloride: 98 mmol/L — ABNORMAL LOW (ref 101–111)
GFR calc Af Amer: 7 mL/min — ABNORMAL LOW (ref >60)
GFR calc non Af Amer: 6 mL/min — ABNORMAL LOW (ref >60)
GLUCOSE: 75 mg/dL (ref 65–99)
PHOSPHORUS: 4.3 mg/dL (ref 2.5–4.6)
Potassium: 4 mmol/L (ref 3.5–5.1)
SODIUM: 135 mmol/L (ref 135–145)

## 2016-03-05 LAB — CBC WITH DIFFERENTIAL/PLATELET
BASOS ABS: 0 10*3/uL (ref 0.0–0.1)
Basophils Relative: 0 %
EOS ABS: 0.8 10*3/uL — AB (ref 0.0–0.7)
EOS PCT: 8 %
HCT: 28.9 % — ABNORMAL LOW (ref 36.0–46.0)
Hemoglobin: 9.5 g/dL — ABNORMAL LOW (ref 12.0–15.0)
LYMPHS PCT: 13 %
Lymphs Abs: 1.2 10*3/uL (ref 0.7–4.0)
MCH: 29 pg (ref 26.0–34.0)
MCHC: 32.9 g/dL (ref 30.0–36.0)
MCV: 88.1 fL (ref 78.0–100.0)
MONO ABS: 0.7 10*3/uL (ref 0.1–1.0)
Monocytes Relative: 8 %
Neutro Abs: 6.4 10*3/uL (ref 1.7–7.7)
Neutrophils Relative %: 71 %
PLATELETS: 376 10*3/uL (ref 150–400)
RBC: 3.28 MIL/uL — ABNORMAL LOW (ref 3.87–5.11)
RDW: 18.6 % — AB (ref 11.5–15.5)
WBC: 9.1 10*3/uL (ref 4.0–10.5)

## 2016-03-05 MED ORDER — MIDODRINE HCL 5 MG PO TABS
10.0000 mg | ORAL_TABLET | Freq: Two times a day (BID) | ORAL | Status: DC
  Administered 2016-03-06: 10 mg via ORAL
  Filled 2016-03-05: qty 2

## 2016-03-05 MED ORDER — HEPARIN SODIUM (PORCINE) 1000 UNIT/ML DIALYSIS
6000.0000 [IU] | Freq: Once | INTRAMUSCULAR | Status: AC
  Administered 2016-03-05: 6000 [IU] via INTRAVENOUS_CENTRAL
  Filled 2016-03-05: qty 6

## 2016-03-05 MED ORDER — MIDODRINE HCL 5 MG PO TABS
ORAL_TABLET | ORAL | Status: AC
  Filled 2016-03-05: qty 2

## 2016-03-05 NOTE — Progress Notes (Signed)
PT Cancellation Note  Patient Details Name: [name] [facility] MRN: [id number] DOB: 1955/09/13   Cancelled Treatment:    Reason Eval/Treat Not Completed: Patient at procedure or test/unavailable (pt in HD and unavailable. Will attempt later as time allows)   Elijha Dedman B Sircharles Holzheimer 03/05/2016, 7:33 AM Elwyn Reach, [place]

## 2016-03-05 NOTE — Progress Notes (Signed)
9 Days Post-Op Procedure(s) (LRB): AORTIC VALVE REPLACEMENT (AVR) implanted with Magna Ease Aortic valve size 64mm (N/A) TRANSESOPHAGEAL ECHOCARDIOGRAM (TEE) (N/A) Subjective: C/o pain  Objective: Vital signs in last 24 hours: Temp:  [97.6 F (36.4 C)-98.3 F (36.8 C)] 97.9 F (36.6 C) (01/08 1137) Pulse Rate:  [78-86] 84 (01/08 1137) Cardiac Rhythm: Normal sinus rhythm (01/08 1137) Resp:  [20-27] 22 (01/08 1137) BP: (98-139)/(49-72) 137/62 (01/08 1137) SpO2:  [97 %-99 %] 97 % (01/08 1137) Weight:  [214 lb 15.2 oz (97.5 kg)-224 lb 3.3 oz (101.7 kg)] 214 lb 15.2 oz (97.5 kg) (01/08 1137)  Hemodynamic parameters for last 24 hours:    Intake/Output from previous day: 01/07 0701 - 01/08 0700 In: 1286.2 [P.O.:720; I.V.:566.2] Out: -  Intake/Output this shift: Total I/O In: -  Out: 800 [Other:800]  General appearance: alert and cooperative Neurologic: intact Heart: regular rate and rhythm Lungs: diminished breath sounds bibasilar Wound: clean and dry  Lab Results:  Recent Labs  03/05/16 0730  WBC 9.1  HGB 9.5*  HCT 28.9*  PLT 376   BMET:  Recent Labs  03/05/16 0730  NA 135  K 4.0  CL 98*  CO2 25  GLUCOSE 75  BUN 22*  CREATININE 6.55*  CALCIUM 9.5    PT/INR: No results for input(s): LABPROT, INR in the last 72 hours. ABG    Component Value Date/Time   PHART 7.430 02/25/2016 2142   HCO3 21.1 02/25/2016 2142   TCO2 32 02/26/2016 1707   ACIDBASEDEF 3.0 (H) 02/25/2016 2142   O2SAT 97.0 02/25/2016 2142   CBG (last 3)  No results for input(s): GLUCAP in the last 72 hours.  Assessment/Plan: S/P Procedure(s) (LRB): AORTIC VALVE REPLACEMENT (AVR) implanted with Magna Ease Aortic valve size 64mm (N/A) TRANSESOPHAGEAL ECHOCARDIOGRAM (TEE) (N/A) -  AVR for AV endocarditis 6 week course of IV antibiotics In SR Her BP is starting inch back up, will decrease midodrine to BID Awaiting SNF bed- I think she is medically ready when arrangements are complete   LOS: 19 days    Melrose Nakayama 03/05/2016

## 2016-03-05 NOTE — Progress Notes (Signed)
Clinical Social Worker met patient at bedside to discuss SNF placement. CSW made patient aware that 2 facility made an offer [facility]). Patient decided to go with [facility] as SNF placement.  5:25 CSW spoke with admission coordinator Santiago Glad and she stated that [facility] does not provide transportation to dialysis.CSW informed Santiago Glad that patient is unable to drive herself and patient does not have family to take her to her MWF appointment due to them working. Santiago Glad stated she will contact CSW back to see what arrangement the facility can make for patient.   [name] Marks, MSW,  [place]

## 2016-03-05 NOTE — Progress Notes (Signed)
[phone] Pt not up to walking yet after dialysis. Stated she would walk later with staff or PT. Will refer diet instruction to dietitian at [facility] as pt has been followed there. Encouraged walking as tolerated with assistance. Reviewed sternal precautions and IS. Had pt demonstrate IS. Discussed CRP 2 but did not refer as pt has dialysis from 630 to 11 on MWF and CRP 2 is on MWF.  She stated she is weak for hours after dialysis and does not think she will be able to attend. Graylon Good RN BSN 03/05/2016 3:04 PM

## 2016-03-05 NOTE — Progress Notes (Signed)
[name] Progress Note   Background: 61 yo AAF admitted with infection/bleeding from RUE AVG, + BC's staph lugdenensis, aortic valve endocarditis, s/p removal of infected AVG 12/22 and AoV replacement 02/25/16.   Dialysis Orders: MWF at [facility]  4h  99.5kg  2/2 bath  RUE AVG (now removed)/ new L IJ cath  Hep 6000 w 2500 midrun - Mircera 273mcg IV q 2 weeks (last given 12/13) - Calcitriol 0.69mcg PO q HD  Assessment: 1. Staph lugdenensis bacteremia w AVG infection: R AVG resected 12/22. Current plan is IV Ancef + po rifampin for 6 weeks (antistaph ATB's initiated 12/21-has had vanco->ancef) -- end date 2/1? 2. Aortic valve endocarditis (staph lugdenensis) - sp AoVR 02/25/16. Ancef and rifampin for 6 weeks as above.  3. Vol excess: WIll have lower EDW at DC,  4. ESRD: MWF HD. Cont on schedule  5. Hypotension: Midodrine increased to 10 TID.  6. Anemia of CKD/ABL: recent vaginal/ AVG bleeding.  s/p 4U prbc's since admission. Tsat low, but holding off on IV iron in setting of bacteremia. Aranesp 116mcg resumed 12/27, increased to 268mcg weekly 1/3.  7. Secondary hyperparathyroidism: Cont sensipar and sevelamer. / Hold calcitriol with elevated corr Ca  8. Endometrial hyperplasia: Megace on hold.   Plan -   HD on MWF schedule.  2K, 2Ca, 97.5kg, Hep 6k qTx. TDC  Will cont cefazolin post HD upon discharge from HD unit.    Cont to gently challenge post weights.     [name] PA-C [facility] Pager [phone] 03/05/2016,8:38 AM  Pt seen, examined and agree w A/P as above.  [name] MD [facility] pager [phone]   03/05/2016, 10:20 AM     Subjective:   Seen on HD BP 139/57 UF goal  1.3L 2K bath No new c/os. Still feels weak, but has been OOB with PT    Objective Vitals:   03/05/16 0736 03/05/16 0745 03/05/16 0800 03/05/16 0830  BP: 136/62 116/63 127/67 (!) 139/57  Pulse: 79 78 78 79  Resp: (!) 27     Temp:  98.2 F (36.8 C)     TempSrc: Oral     SpO2: 98%     Weight: 98.3 kg (216 lb 11.4 oz)     Height:       Physical Exam Awake, alert, oriented On dialysis L TDC clean and dry  Regular rhythm [id number] No S3 2/6 systolic murmur USB Abd obese, not tender No LE edema noted    Recent Labs Lab 02/28/16 0430  03/01/16 0310 03/02/16 0400 03/05/16 0730  NA 132*  < > 134* 133* 135  K 3.4*  < > 3.9 4.0 4.0  CL 95*  < > 100* 99* 98*  CO2 26  < > 26 23 25   GLUCOSE 93  < > 78 66 75  BUN 8  < > 15 22* 22*  CREATININE 3.31*  < > 3.97* 5.26* 6.55*  CALCIUM 9.3  < > 9.3 9.6 9.5  PHOS 3.0  --  2.9  --  4.3  < > = values in this interval not displayed.   Recent Labs Lab 02/29/16 0300 03/01/16 0310 03/05/16 0730  AST 23  --   --   ALT <5*  --   --   ALKPHOS 111  --   --   BILITOT 0.4  --   --   PROT 5.8*  --   --   ALBUMIN 1.9* 1.9* 1.9*   CBC:  Recent Labs Lab 02/28/16 0430 02/29/16 0300 03/01/16 0310 03/02/16 0400 03/05/16 0730  WBC 17.7* 16.5* 13.5* 11.9* 9.1  NEUTROABS  --   --   --   --  6.4  HGB 7.6* 7.8* 8.5* 9.2* 9.5*  HCT 22.8* 23.3* 26.1* 28.8* 28.9*  MCV 84.8 86.0 86.4 88.6 88.1  PLT 278 281 280 301 376    Blood Culture    Component Value Date/Time   SDES TISSUE 02/25/2016 1000   SPECREQUEST AORTIC VALVE LEAFLET POF ZINACEF AND VANC 02/25/2016 1000   CULT  02/25/2016 1000    FEW STAPHYLOCOCCUS LUGDUNENSIS NO ANAEROBES ISOLATED    REPTSTATUS 03/01/2016 FINAL 02/25/2016 1000   Medications:  . aspirin EC  325 mg Oral Daily   Or  . aspirin  324 mg Per Tube Daily  . bisacodyl  10 mg Oral Daily   Or  . bisacodyl  10 mg Rectal Daily  .  ceFAZolin (ANCEF) IV  2 g Intravenous Q M,W,F-1800  . cinacalcet  60 mg Oral QPM  . darbepoetin (ARANESP) injection - DIALYSIS  200 mcg Intravenous Q Wed-HD  . enoxaparin (LOVENOX) injection  30 mg Subcutaneous QHS  . mouth rinse  15 mL Mouth Rinse BID  . midodrine      . midodrine  10 mg Oral TID WC  . rifampin  300  mg Oral Q12H  . sevelamer carbonate  1,600 mg Oral TID WC  . sodium chloride flush  3 mL Intravenous Q12H

## 2016-03-06 DIAGNOSIS — M6281 Muscle weakness (generalized): Secondary | ICD-10-CM | POA: Diagnosis not present

## 2016-03-06 DIAGNOSIS — I25110 Atherosclerotic heart disease of native coronary artery with unstable angina pectoris: Secondary | ICD-10-CM | POA: Diagnosis not present

## 2016-03-06 DIAGNOSIS — Z8673 Personal history of transient ischemic attack (TIA), and cerebral infarction without residual deficits: Secondary | ICD-10-CM | POA: Diagnosis not present

## 2016-03-06 DIAGNOSIS — N189 Chronic kidney disease, unspecified: Secondary | ICD-10-CM | POA: Diagnosis not present

## 2016-03-06 DIAGNOSIS — D631 Anemia in chronic kidney disease: Secondary | ICD-10-CM | POA: Diagnosis not present

## 2016-03-06 DIAGNOSIS — Z7982 Long term (current) use of aspirin: Secondary | ICD-10-CM | POA: Diagnosis not present

## 2016-03-06 DIAGNOSIS — R4182 Altered mental status, unspecified: Secondary | ICD-10-CM | POA: Diagnosis not present

## 2016-03-06 DIAGNOSIS — I4891 Unspecified atrial fibrillation: Secondary | ICD-10-CM | POA: Diagnosis not present

## 2016-03-06 DIAGNOSIS — R7881 Bacteremia: Secondary | ICD-10-CM | POA: Diagnosis not present

## 2016-03-06 DIAGNOSIS — G9340 Encephalopathy, unspecified: Secondary | ICD-10-CM | POA: Diagnosis not present

## 2016-03-06 DIAGNOSIS — R5381 Other malaise: Secondary | ICD-10-CM | POA: Diagnosis not present

## 2016-03-06 DIAGNOSIS — Z79899 Other long term (current) drug therapy: Secondary | ICD-10-CM | POA: Diagnosis not present

## 2016-03-06 DIAGNOSIS — M5432 Sciatica, left side: Secondary | ICD-10-CM | POA: Diagnosis not present

## 2016-03-06 DIAGNOSIS — R404 Transient alteration of awareness: Secondary | ICD-10-CM | POA: Diagnosis not present

## 2016-03-06 DIAGNOSIS — R531 Weakness: Secondary | ICD-10-CM | POA: Diagnosis not present

## 2016-03-06 DIAGNOSIS — N2581 Secondary hyperparathyroidism of renal origin: Secondary | ICD-10-CM | POA: Diagnosis not present

## 2016-03-06 DIAGNOSIS — K219 Gastro-esophageal reflux disease without esophagitis: Secondary | ICD-10-CM | POA: Diagnosis not present

## 2016-03-06 DIAGNOSIS — N186 End stage renal disease: Secondary | ICD-10-CM | POA: Diagnosis not present

## 2016-03-06 DIAGNOSIS — D649 Anemia, unspecified: Secondary | ICD-10-CM | POA: Diagnosis not present

## 2016-03-06 DIAGNOSIS — R609 Edema, unspecified: Secondary | ICD-10-CM | POA: Diagnosis not present

## 2016-03-06 DIAGNOSIS — M5431 Sciatica, right side: Secondary | ICD-10-CM | POA: Diagnosis not present

## 2016-03-06 DIAGNOSIS — W19XXXA Unspecified fall, initial encounter: Secondary | ICD-10-CM | POA: Diagnosis not present

## 2016-03-06 DIAGNOSIS — I959 Hypotension, unspecified: Secondary | ICD-10-CM | POA: Diagnosis not present

## 2016-03-06 DIAGNOSIS — D509 Iron deficiency anemia, unspecified: Secondary | ICD-10-CM | POA: Diagnosis not present

## 2016-03-06 DIAGNOSIS — T827XXD Infection and inflammatory reaction due to other cardiac and vascular devices, implants and grafts, subsequent encounter: Secondary | ICD-10-CM | POA: Diagnosis not present

## 2016-03-06 DIAGNOSIS — Z992 Dependence on renal dialysis: Secondary | ICD-10-CM | POA: Diagnosis not present

## 2016-03-06 DIAGNOSIS — R262 Difficulty in walking, not elsewhere classified: Secondary | ICD-10-CM | POA: Diagnosis not present

## 2016-03-06 MED ORDER — OXYCODONE HCL 5 MG PO TABS: 5.0000 mg | ORAL_TABLET | ORAL | 0 refills | Status: DC | PRN

## 2016-03-06 MED ORDER — CEFAZOLIN SODIUM-DEXTROSE 2-4 GM/100ML-% IV SOLN: 2.0000 g | INTRAVENOUS | 0 refills | Status: AC

## 2016-03-06 MED ORDER — ASPIRIN 325 MG PO TBEC: 325.0000 mg | DELAYED_RELEASE_TABLET | Freq: Every day | ORAL | 0 refills | Status: DC

## 2016-03-06 MED ORDER — RIFAMPIN 300 MG PO CAPS: 300.0000 mg | ORAL_CAPSULE | Freq: Two times a day (BID) | ORAL | 0 refills | Status: AC

## 2016-03-06 NOTE — Progress Notes (Signed)
Clinical Social Worker facilitated patient discharge including contacting patient family and facility to confirm patient discharge plans.  Clinical information faxed to facility and family agreeable with plan.  CSW arranged ambulance transport via [facility] to [facility] .  RN Helene Kelp to call [phone] and ask for the nurse who has 123B for report prior to discharge.  Clinical Social Worker will sign off for now as social work intervention is no longer needed. Please consult [place] again if new need arises.  Rhea Pink, MSW, [place]

## 2016-03-06 NOTE — Care Management Note (Signed)
Case Management Note Previous CM note initiated by Erenest Rasher, RN 02/24/2016, 6:56 PM   Patient Details  Name: [name] Marks MRN: [id number] Date of Birth: November 29, 1955  Subjective/Objective:   Severe aortic insufficiency, AVR planned on 02/25/2016                 Action/Plan: Discharge Planning: NCM spoke to pt and she lives alone. Her son is available to assist as needed. Pt will need RW at time of dc. Waiting final recommendations for home. Will continue to follow for dc needs.   PCP Tonette Bihari   Expected Discharge Date:     03/06/16             Expected Discharge Plan:  [place]  In-House Referral:  Clinical Social Work  Discharge planning Services  CM Consult  Post Acute Care Choice:    Choice offered to:     DME Arranged:    DME Agency:     HH Arranged:    [place] Agency:     Status of Service:  Completed, signed off  If discussed at [employer] of Stay Meetings, dates discussed:  1/9  Discharge Disposition: skilled facility   Additional Comments:  03/06/16- 1000- Gerald Honea RN, CM- Pt s/p AVR, SNF recommendation made, CSW following for placement needs- pt stable for d/c today- plan for STSNF- per CSW pt to go to [facility], [place], RN 03/06/2016, 9:58 AM [phone]

## 2016-03-06 NOTE — Progress Notes (Addendum)
      [address]       [place] [address]             [phone]      10 Days Post-Op Procedure(s) (LRB): AORTIC VALVE REPLACEMENT (AVR) implanted with Magna Ease Aortic valve size 69mm (N/A) TRANSESOPHAGEAL ECHOCARDIOGRAM (TEE) (N/A)   Subjective:  No new complaints.  Hoping to get discharged soon.  Objective: Vital signs in last 24 hours: Temp:  [97.9 F (36.6 C)-98.7 F (37.1 C)] 98 F (36.7 C) (01/09 0552) Pulse Rate:  [78-112] 112 (01/09 0752) Cardiac Rhythm: Normal sinus rhythm (01/08 2130) Resp:  [18-22] 18 (01/09 0552) BP: (81-139)/(49-78) 136/78 (01/09 0752) SpO2:  [95 %-98 %] 95 % (01/09 0752) Weight:  [214 lb 15.2 oz (97.5 kg)-224 lb 6.9 oz (101.8 kg)] 224 lb 6.9 oz (101.8 kg) (01/09 0552)  Intake/Output from previous day: 01/08 0701 - 01/09 0700 In: 120 [P.O.:120] Out: 800   General appearance: alert, cooperative and no distress Heart: regular rate and rhythm Lungs: clear to auscultation bilaterally Abdomen: soft, non-tender; bowel sounds normal; no masses,  no organomegaly Extremities: edema trace Wound: clean and dry  Lab Results:  Recent Labs  03/05/16 0730  WBC 9.1  HGB 9.5*  HCT 28.9*  PLT 376   BMET:  Recent Labs  03/05/16 0730  NA 135  K 4.0  CL 98*  CO2 25  GLUCOSE 75  BUN 22*  CREATININE 6.55*  CALCIUM 9.5    PT/INR: No results for input(s): LABPROT, INR in the last 72 hours. ABG    Component Value Date/Time   PHART 7.430 02/25/2016 2142   HCO3 21.1 02/25/2016 2142   TCO2 32 02/26/2016 1707   ACIDBASEDEF 3.0 (H) 02/25/2016 2142   O2SAT 97.0 02/25/2016 2142   CBG (last 3)  No results for input(s): GLUCAP in the last 72 hours.  Assessment/Plan: S/P Procedure(s) (LRB): AORTIC VALVE REPLACEMENT (AVR) implanted with Magna Ease Aortic valve size 68mm (N/A) TRANSESOPHAGEAL ECHOCARDIOGRAM (TEE) (N/A)  1. CV-NSR, BP labile ranging 80s-140s-continue Midodrine 2. Pulm- no acute issues, continue IS 3.  Renal-ESRD, dialysis MWF 4. ID- endocarditis- needs 6 weeks of Ancef, Rifampin 5. Secondary Hyperparathyroidism- on Sensipar and Sevelamer 6. Deconditioning- SNF placement arranged, however they are unable to transport patient to dialysis, currently trying to get transportation arranged 7. Dispo- patient stable, will plan d/c central prior to discharge, to SNF once dialysis transportation can be finalized.   LOS: 20 days    Ellwood Handler 03/06/2016 Patient seen and examined, agree with above Ready for SNF when arrangements are complete Dc central line  Remo Lipps C. Roxan Hockey, MD [facility] [phone]

## 2016-03-06 NOTE — Care Management Important Message (Signed)
Important Message  Patient Details  Name: [name] Marks MRN: [id number] Date of Birth: [birth year]   Medicare Important Message Given:  Yes    Sherard Sutch Abena 03/06/2016, 10:22 AM

## 2016-03-06 NOTE — Progress Notes (Signed)
CT sutures removed per order and per protocol. Pt tolerated well. Sites painted with tincture steri-strips applied. Pt educated not to remove strips as they will come off on their own. Call bell and phone within reach. Will continue to monitor.

## 2016-03-06 NOTE — Progress Notes (Signed)
Physical Therapy Treatment Patient Details Name: [name] Marks MRN: [id number] DOB: 22-Feb-1956 Today's Date: 03/06/2016    History of Present Illness [name] Marks is a 61 y.o. female admitted with AMS, infected RUE AV graft and endocarditis. Pt s/p AVR on 12/31. PMH is significant for ESRD on HD, gout    PT Comments    Pt able to progress gait today with ability to ambulate 150' and perform HEP. Pt educated for all precautions, transfers and gait with encouragement to progress gait and activity with nursing.   HR 96-135 BP 95/49 supine HR 120 sitting with BP 123/74 With walking HR 135 BP post walk 136/78 with return to HR 112 in sitting sats 95% on RA  Follow Up Recommendations  SNF;Supervision/Assistance - 24 hour     Equipment Recommendations       Recommendations for Other Services       Precautions / Restrictions Precautions Precautions: Fall;Sternal    Mobility  Bed Mobility Overal bed mobility: Needs Assistance Bed Mobility: Supine to Sit Rolling: Min assist Sidelying to sit: Mod assist       General bed mobility comments: cues for sequence with assist to rotate trunk and pelvis to roll as well as assist to elevate trunk   Transfers Overall transfer level: Needs assistance   Transfers: Sit to/from Stand Sit to Stand: Mod assist         General transfer comment: mod assist to stand from bed with cues for hands on thighs, assist for  anterior translation and rise, improved steadiness with standing, improved descent control  Ambulation/Gait Ambulation/Gait assistance: Min assist Ambulation Distance (Feet): 150 Feet Assistive device: Rolling walker (2 wheeled) Gait Pattern/deviations: Step-through pattern;Decreased stride length;Trunk flexed   Gait velocity interpretation: Below normal speed for age/gender General Gait Details: cues for posture, position in RW, breathing technique and safety, 3 standing rest breaks   Stairs             Wheelchair Mobility    Modified Rankin (Stroke Patients Only)       Balance Overall balance assessment: Needs assistance   Sitting balance-Leahy Scale: Fair       Standing balance-Leahy Scale: Poor                      Cognition Arousal/Alertness: Awake/alert Behavior During Therapy: Flat affect Overall Cognitive Status: Within Functional Limits for tasks assessed       Memory: Decreased recall of precautions         General Comments: pt able to recall 3/5 precautions without cues and all with cueing    Exercises General Exercises - Lower Extremity Long Arc Quad: AROM;Both;Seated;15 reps Hip ABduction/ADduction: AROM;15 reps;Both;Seated Hip Flexion/Marching: AROM;Both;Seated;20 reps Toe Raises: AROM;15 reps;Both;Seated    General Comments        Pertinent Vitals/Pain Pain Score: 3  Pain Location: waist  Pain Descriptors / Indicators: Heaviness    Home Living                      Prior Function            PT Goals (current goals can now be found in the care plan section) Progress towards PT goals: Progressing toward goals    Frequency           PT Plan Current plan remains appropriate    Co-evaluation             End of Session Equipment Utilized During  Treatment: Gait belt Activity Tolerance: Patient tolerated treatment well Patient left: in chair;with call bell/phone within reach     Time: 0723-0748 PT Time Calculation (min) (ACUTE ONLY): 25 min  Charges:  $Gait Training: 8-22 mins $Therapeutic Exercise: 8-22 mins                    G Codes:      [name] 20-Mar-2016, 7:55 AM Elwyn Reach, [place]

## 2016-03-07 DIAGNOSIS — N2581 Secondary hyperparathyroidism of renal origin: Secondary | ICD-10-CM | POA: Diagnosis not present

## 2016-03-07 DIAGNOSIS — N186 End stage renal disease: Secondary | ICD-10-CM | POA: Diagnosis not present

## 2016-03-07 DIAGNOSIS — D631 Anemia in chronic kidney disease: Secondary | ICD-10-CM | POA: Diagnosis not present

## 2016-03-07 DIAGNOSIS — D509 Iron deficiency anemia, unspecified: Secondary | ICD-10-CM | POA: Diagnosis not present

## 2016-03-09 DIAGNOSIS — N186 End stage renal disease: Secondary | ICD-10-CM | POA: Diagnosis not present

## 2016-03-09 DIAGNOSIS — D649 Anemia, unspecified: Secondary | ICD-10-CM | POA: Diagnosis not present

## 2016-03-09 DIAGNOSIS — D509 Iron deficiency anemia, unspecified: Secondary | ICD-10-CM | POA: Diagnosis not present

## 2016-03-09 DIAGNOSIS — D631 Anemia in chronic kidney disease: Secondary | ICD-10-CM | POA: Diagnosis not present

## 2016-03-09 DIAGNOSIS — R7881 Bacteremia: Secondary | ICD-10-CM | POA: Diagnosis not present

## 2016-03-09 DIAGNOSIS — N2581 Secondary hyperparathyroidism of renal origin: Secondary | ICD-10-CM | POA: Diagnosis not present

## 2016-03-12 ENCOUNTER — Telehealth: Payer: Self-pay

## 2016-03-12 DIAGNOSIS — D631 Anemia in chronic kidney disease: Secondary | ICD-10-CM | POA: Diagnosis not present

## 2016-03-12 DIAGNOSIS — N186 End stage renal disease: Secondary | ICD-10-CM | POA: Diagnosis not present

## 2016-03-12 DIAGNOSIS — N2581 Secondary hyperparathyroidism of renal origin: Secondary | ICD-10-CM | POA: Diagnosis not present

## 2016-03-12 DIAGNOSIS — D509 Iron deficiency anemia, unspecified: Secondary | ICD-10-CM | POA: Diagnosis not present

## 2016-03-12 NOTE — Telephone Encounter (Signed)
Patient said she will likely be in rehab center for at least another mos. She said she will call us when she gets out so she can see Dr. Denman George about daily bleeding.

## 2016-03-12 NOTE — Telephone Encounter (Signed)
I finally reached patient today to let her know about result. Please note she was hospitalized 12/20-03/06/16. She is still not home she said she is at [employer]. She had valve replacement.  Patient said she has been having vaginal bleeding for several mos and continues today like a period. She took Megace 80 mg daily for awhile and said she bled for those two mos as well.

## 2016-03-12 NOTE — Telephone Encounter (Signed)
I would recommend that we make an appointment for her to follow-up with Dr. Denman George when she gets relief from the hospital. I had discussed this case with her in the past.

## 2016-03-12 NOTE — Telephone Encounter (Signed)
-----   Message from Terrance Mass, MD sent at 02/15/2016  2:17 PM EST ----- Please inform patient pathology report was benign

## 2016-03-13 ENCOUNTER — Other Ambulatory Visit: Payer: Self-pay | Admitting: Gynecology

## 2016-03-13 DIAGNOSIS — M6281 Muscle weakness (generalized): Secondary | ICD-10-CM | POA: Diagnosis not present

## 2016-03-13 DIAGNOSIS — R5381 Other malaise: Secondary | ICD-10-CM | POA: Diagnosis not present

## 2016-03-13 DIAGNOSIS — R262 Difficulty in walking, not elsewhere classified: Secondary | ICD-10-CM | POA: Diagnosis not present

## 2016-03-13 MED ORDER — MEGESTROL ACETATE 40 MG PO TABS: 40.0000 mg | ORAL_TABLET | Freq: Two times a day (BID) | ORAL | 1 refills | Status: DC

## 2016-03-13 NOTE — Telephone Encounter (Signed)
Left message to call.

## 2016-03-13 NOTE — Telephone Encounter (Signed)
Ok. Meanwhile she should stay on MEgace 40 MG BID

## 2016-03-13 NOTE — Telephone Encounter (Signed)
Pt informed, rx sent 

## 2016-03-14 DIAGNOSIS — N186 End stage renal disease: Secondary | ICD-10-CM | POA: Diagnosis not present

## 2016-03-14 DIAGNOSIS — D509 Iron deficiency anemia, unspecified: Secondary | ICD-10-CM | POA: Diagnosis not present

## 2016-03-14 DIAGNOSIS — D631 Anemia in chronic kidney disease: Secondary | ICD-10-CM | POA: Diagnosis not present

## 2016-03-14 DIAGNOSIS — N2581 Secondary hyperparathyroidism of renal origin: Secondary | ICD-10-CM | POA: Diagnosis not present

## 2016-03-15 ENCOUNTER — Encounter: Payer: Medicare Other | Admitting: Physician Assistant

## 2016-03-15 LAB — ECHO INTRAOPERATIVE TEE
HEIGHTINCHES: 61 in
SINUS: 2.9 cm
STJ: 2.5 cm
WEIGHTICAEL: 3463.87 [oz_av]

## 2016-03-16 DIAGNOSIS — D509 Iron deficiency anemia, unspecified: Secondary | ICD-10-CM | POA: Diagnosis not present

## 2016-03-16 DIAGNOSIS — N2581 Secondary hyperparathyroidism of renal origin: Secondary | ICD-10-CM | POA: Diagnosis not present

## 2016-03-16 DIAGNOSIS — N186 End stage renal disease: Secondary | ICD-10-CM | POA: Diagnosis not present

## 2016-03-16 DIAGNOSIS — D631 Anemia in chronic kidney disease: Secondary | ICD-10-CM | POA: Diagnosis not present

## 2016-03-19 DIAGNOSIS — Z992 Dependence on renal dialysis: Secondary | ICD-10-CM | POA: Diagnosis not present

## 2016-03-19 DIAGNOSIS — N2581 Secondary hyperparathyroidism of renal origin: Secondary | ICD-10-CM | POA: Diagnosis not present

## 2016-03-19 DIAGNOSIS — D509 Iron deficiency anemia, unspecified: Secondary | ICD-10-CM | POA: Diagnosis not present

## 2016-03-19 DIAGNOSIS — T827XXD Infection and inflammatory reaction due to other cardiac and vascular devices, implants and grafts, subsequent encounter: Secondary | ICD-10-CM | POA: Diagnosis not present

## 2016-03-19 DIAGNOSIS — R609 Edema, unspecified: Secondary | ICD-10-CM | POA: Diagnosis not present

## 2016-03-19 DIAGNOSIS — D631 Anemia in chronic kidney disease: Secondary | ICD-10-CM | POA: Diagnosis not present

## 2016-03-19 DIAGNOSIS — N186 End stage renal disease: Secondary | ICD-10-CM | POA: Diagnosis not present

## 2016-03-21 DIAGNOSIS — M5431 Sciatica, right side: Secondary | ICD-10-CM | POA: Diagnosis not present

## 2016-03-21 DIAGNOSIS — D509 Iron deficiency anemia, unspecified: Secondary | ICD-10-CM | POA: Diagnosis not present

## 2016-03-21 DIAGNOSIS — N186 End stage renal disease: Secondary | ICD-10-CM | POA: Diagnosis not present

## 2016-03-21 DIAGNOSIS — M6281 Muscle weakness (generalized): Secondary | ICD-10-CM | POA: Diagnosis not present

## 2016-03-21 DIAGNOSIS — W19XXXA Unspecified fall, initial encounter: Secondary | ICD-10-CM | POA: Diagnosis not present

## 2016-03-21 DIAGNOSIS — N2581 Secondary hyperparathyroidism of renal origin: Secondary | ICD-10-CM | POA: Diagnosis not present

## 2016-03-21 DIAGNOSIS — D631 Anemia in chronic kidney disease: Secondary | ICD-10-CM | POA: Diagnosis not present

## 2016-03-23 DIAGNOSIS — D631 Anemia in chronic kidney disease: Secondary | ICD-10-CM | POA: Diagnosis not present

## 2016-03-23 DIAGNOSIS — N186 End stage renal disease: Secondary | ICD-10-CM | POA: Diagnosis not present

## 2016-03-23 DIAGNOSIS — N2581 Secondary hyperparathyroidism of renal origin: Secondary | ICD-10-CM | POA: Diagnosis not present

## 2016-03-23 DIAGNOSIS — D509 Iron deficiency anemia, unspecified: Secondary | ICD-10-CM | POA: Diagnosis not present

## 2016-03-26 ENCOUNTER — Emergency Department (HOSPITAL_COMMUNITY)
Admission: EM | Admit: 2016-03-26 | Discharge: 2016-03-27 | Disposition: A | Discharge status: Home / Self Care | Payer: Medicare Other | Attending: Emergency Medicine | Admitting: Emergency Medicine

## 2016-03-26 ENCOUNTER — Encounter (HOSPITAL_COMMUNITY): Payer: Self-pay | Admitting: Emergency Medicine

## 2016-03-26 DIAGNOSIS — R531 Weakness: Secondary | ICD-10-CM | POA: Diagnosis not present

## 2016-03-26 DIAGNOSIS — Z79899 Other long term (current) drug therapy: Secondary | ICD-10-CM | POA: Insufficient documentation

## 2016-03-26 DIAGNOSIS — N186 End stage renal disease: Secondary | ICD-10-CM | POA: Insufficient documentation

## 2016-03-26 DIAGNOSIS — Z992 Dependence on renal dialysis: Secondary | ICD-10-CM | POA: Diagnosis not present

## 2016-03-26 DIAGNOSIS — D509 Iron deficiency anemia, unspecified: Secondary | ICD-10-CM | POA: Diagnosis not present

## 2016-03-26 DIAGNOSIS — Z7982 Long term (current) use of aspirin: Secondary | ICD-10-CM | POA: Insufficient documentation

## 2016-03-26 DIAGNOSIS — R5381 Other malaise: Secondary | ICD-10-CM | POA: Insufficient documentation

## 2016-03-26 DIAGNOSIS — N2581 Secondary hyperparathyroidism of renal origin: Secondary | ICD-10-CM | POA: Diagnosis not present

## 2016-03-26 DIAGNOSIS — D631 Anemia in chronic kidney disease: Secondary | ICD-10-CM | POA: Diagnosis not present

## 2016-03-26 LAB — CBC
HEMATOCRIT: 31.3 % — AB (ref 36.0–46.0)
Hemoglobin: 9.9 g/dL — ABNORMAL LOW (ref 12.0–15.0)
MCH: 28 pg (ref 26.0–34.0)
MCHC: 31.6 g/dL (ref 30.0–36.0)
MCV: 88.7 fL (ref 78.0–100.0)
Platelets: 224 10*3/uL (ref 150–400)
RBC: 3.53 MIL/uL — ABNORMAL LOW (ref 3.87–5.11)
RDW: 17.6 % — AB (ref 11.5–15.5)
WBC: 6.1 10*3/uL (ref 4.0–10.5)

## 2016-03-26 LAB — BASIC METABOLIC PANEL
Anion gap: 13 (ref 5–15)
CHLORIDE: 93 mmol/L — AB (ref 101–111)
CO2: 28 mmol/L (ref 22–32)
Calcium: 8.2 mg/dL — ABNORMAL LOW (ref 8.9–10.3)
Creatinine, Ser: 2.77 mg/dL — ABNORMAL HIGH (ref 0.44–1.00)
GFR calc Af Amer: 20 mL/min — ABNORMAL LOW (ref >60)
GFR, EST NON AFRICAN AMERICAN: 17 mL/min — AB (ref >60)
GLUCOSE: 102 mg/dL — AB (ref 65–99)
POTASSIUM: 3 mmol/L — AB (ref 3.5–5.1)
Sodium: 134 mmol/L — ABNORMAL LOW (ref 135–145)

## 2016-03-26 LAB — CBG MONITORING, ED: Glucose-Capillary: 89 mg/dL (ref 65–99)

## 2016-03-26 NOTE — ED Notes (Signed)
CSW at bedside.

## 2016-03-26 NOTE — ED Notes (Signed)
Labs drawn

## 2016-03-26 NOTE — ED Notes (Signed)
Pt is dialysis pt (MWF) last went today. Pt does not make urine

## 2016-03-26 NOTE — Care Management (Signed)
ED CM noted patient to have been placed at [facility] on 03/06/16 for Rehab. Patient presents to [facility] ED tonight c/o weakness. Patient is on HD M/W/F. ED CSW was also consulted to assist with care transition planning CM met with patient at bedside who states, she was discharged home  today from [facility] and after getting home she sustained a fall, and was brought back to [facility] ED.  AS per patient she does not have anymore SNF days left. CM discussed HH services, patient states, she has her son who assists some with her care. Patient is agreeable with care transitional plan for HH services. HH RN/PT/OT,HHA, SW. Offered choice [facility] selected.  CM awaiting HH orders to fax to [facility].   

## 2016-03-26 NOTE — ED Triage Notes (Signed)
EMS stated, recently discharged . Pt. Was weak, and was outside and was just waiting on the outside from EMS . Pt. wanted to come back here cause she is weak.

## 2016-03-26 NOTE — ED Notes (Signed)
[facility] contacted to tx patient home 

## 2016-03-26 NOTE — Progress Notes (Signed)
CSW spoke with pt re: disposition concerns.  Pt just d/c today from [facility] following a 20 day rehab stay.  Pt unable to afford co-pays for a continued stay and has a Medigap plan with no SNF benefit.  CSW unable to place pt at this time, due to her inability to pay her co-pays of 127.00/day.  Encouraged pt to apply for LTC Medicaid if she needs continued NH care.

## 2016-03-26 NOTE — ED Notes (Signed)
Report given to [place], RN

## 2016-03-27 ENCOUNTER — Other Ambulatory Visit: Payer: Self-pay | Admitting: Internal Medicine

## 2016-03-27 ENCOUNTER — Other Ambulatory Visit: Payer: Self-pay

## 2016-03-27 ENCOUNTER — Encounter: Payer: Self-pay | Admitting: Licensed Clinical Social Worker

## 2016-03-27 ENCOUNTER — Encounter: Payer: Medicare Other | Admitting: Physician Assistant

## 2016-03-27 ENCOUNTER — Other Ambulatory Visit: Payer: Self-pay | Admitting: Licensed Clinical Social Worker

## 2016-03-27 DIAGNOSIS — N186 End stage renal disease: Secondary | ICD-10-CM

## 2016-03-27 DIAGNOSIS — I25110 Atherosclerotic heart disease of native coronary artery with unstable angina pectoris: Secondary | ICD-10-CM | POA: Diagnosis not present

## 2016-03-27 DIAGNOSIS — W19XXXA Unspecified fall, initial encounter: Secondary | ICD-10-CM | POA: Diagnosis not present

## 2016-03-27 DIAGNOSIS — I351 Nonrheumatic aortic (valve) insufficiency: Secondary | ICD-10-CM

## 2016-03-27 DIAGNOSIS — I330 Acute and subacute infective endocarditis: Secondary | ICD-10-CM | POA: Diagnosis not present

## 2016-03-27 DIAGNOSIS — Z992 Dependence on renal dialysis: Secondary | ICD-10-CM

## 2016-03-27 DIAGNOSIS — R531 Weakness: Secondary | ICD-10-CM | POA: Diagnosis not present

## 2016-03-27 NOTE — Care Management (Signed)
03/27/16 15:47  W. Stann Mainland RN CNM HH orders faxed to [facility] via [facility], CM will follow up for confirmation.

## 2016-03-27 NOTE — Patient Outreach (Addendum)
[place] [facility]) Care Management  03/27/2016  [name] Marks 07-03-1955 [id number]     Telephone Screen  Referral Date: 03/26/16 Referral Source: MD office([facility]) Referral Reason: "renal failure, high risk for rehospitalization"    Outreach attempt #  1 to patient. Spoke with patient and screening completed.  Social: Patient states she resides in her home alone. Her son has been staying temporarily to help her out since she returned home. Patient states that she is too weak to walk and do much of anything on her own. She reports that up until last month son was driving her to MD appts. However, now that she is unable to walk due to weakness her son is not able to transport her as he can not get her out of the home. Patient states that her son has medial problems of his own and should not be lifting and transporting her. She states that she has steps at her front door that she is physically unable to maneuver. She fell yesterday trying to get into the home. She reports she is in need of ramp installed . Other DME in the home include wheelchair, cane and hospital bed.    Conditions: Patient has PMH of ESRD(on dialysis M,W,F), aortic valve replacement, endocarditis and GERD. She was hospitalized from 02/15/16 to 03/06/16 for an infected AV graft. From there she was sent to SNF for rehab. Patient reports she exhausted her SNF days and was unable to pay co pays for SNF days and was therefore discharged on 03/26/16. She reports she sustained a fall trying to get into her home on yesterday after being sent home which subsequently led her back to the ER on yesterday for weakness and a fall. Patient voices some frustrations with the whole ordeal and not feeling like she is strong enough to be at home. [place] services were arranged(RN,PT/OT,aide,SW). Patient reports that nurse from [facility] agency came out and did initial eval and she is awaiting to hear from PT.   Medications:  Patient reported taking about 6-63meds. Denies any issues with affordability and/or managing meds at this time.   Appointments: She has PCP appt on 04/20/16 and appt with Dr. Financial trader) on 04/03/16.   Advance Directives: None. Declined info.   Consent: [facility] services reviewed and discussed. Patient gave verbal consent for [facility] services.    Plan: RN CM will notify [facility] administrative assistant of case status. RN CM will send [facility] SW referral for possible transportation assistance and assistance with ramp. RN CM will send [facility] community referral for [facility] and further in home eval and assessment of care needs. RN CM provided patient with 24hr Nurse Line contact info.  Enzo Montgomery, RN,BSN,CCM [place] Management Telephonic Care Management Coordinator Direct Phone: [phone] Toll Free: [phone] Fax: [phone]

## 2016-03-27 NOTE — Patient Outreach (Signed)
[place] [facility]) Care Management  03/27/2016  [name] Marks [birth year] [id number]   Assessment- CSW received new referral on patient on 03/27/16. CSW completed initial outreach call and patient answered. HIPPA verifications provided. CSW introduced self, reason for call and of [facility] social work services. Patient is agreeable to services. Patient's son is temporarily staying with her at her home due to recent hospitalizations. Patient states that she is extremely weak and has difficulty walking and cannot get out of the house. Patient goes to dialysis Mondays, Wednesdays and Fridays. She reports that her son usually provides transportation to her medical appointments but cannot assist at this time since she cannot get out of the house. She has steps at the front of her door but she is unable to use them. Patient went to SNF after her hospitalization from 02/15/16-03/06/16 and had to be discharged back home because she could not afford the co pays and had exhausted her days there. She fell yesterday trying to get into her home from SNF. Patient states that she is in need of a ramp. Patient currently has a wheelchair, cane and hospital bed. Patient confirms that [facility] is now involved. Patient currently has [facility] as well as [facility] transportation services to dialysis but she cannot use this transportation service because she is unable to ambulate at this time. Patient is agreeable to home visit on 03/29/16.  Plan-CSW will send involvement letter to PCP. CSW will complete home visit this week.   [name], BSW, MSW, [email or web address] Phone: [phone] Fax: [phone]

## 2016-03-27 NOTE — ED Provider Notes (Signed)
[facility] DEPT Provider Note   CSN: [id number] Arrival date & time: 03/26/16  1828     History   Chief Complaint Chief Complaint  Patient presents with  . Weakness    HPI [name] Marks is a 61 y.o. female.  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  Patient denies any acute pain from the fall, specifically denies head trauma, headache, vomiting, extremity pain, or new numbness or weakness.   She has no history to suggest an infection, acute bleed, or acute injury.  Had dialysis today.  Reports she had been gaining strength with PT and is motivated to return to her baseline, however was not ready for discharge today--however could not afford to stay in rehab now that her coverage is completed. Denies chest pain, dyspnea, fever, cough, congestion, new numbness or weakness. Denies pain from fall but reports she was just too weak to get up.   Past Medical History:  Diagnosis Date  . Complication of anesthesia    due to kidney disease  . End stage renal disease on dialysis ([place])   . GERD (gastroesophageal reflux disease)   . Gout     Patient Active Problem List   Diagnosis Date Noted  . S/P AVR 02/25/2016  . Aortic valve vegetation   . Severe aortic insufficiency   . Bacteremia due to coagulase-negative Staphylococcus   . S/P dialysis catheter insertion ([employer])   . Bilateral low back pain with sciatica   . Neck pain   .  Endocarditis of native valve   . End stage renal disease on dialysis ([place])   . Staphylococcus aureus bacteremia   . Anemia   . Infection of AV graft for dialysis ([place])   . Bacteremia   . Septic shock ([place])   . Encephalopathy   . Altered mental status 02/15/2016  . Renal dialysis device, implant, or graft complication 78/29/5621  . Pain and swelling of right upper extremity 12/20/2015  . Complex endometrial hyperplasia with atypia 11/08/2015  . Increased endometrial stripe thickness 11/03/2015  . Obesity 10/28/2014  . Shoulder pain, bilateral 10/28/2014  . Health care maintenance 10/28/2014  . Hypotension, unspecified 01/14/2013  . Right carotid bruit 01/08/2013  . Generalized headaches 01/08/2013  . Chronic female pelvic pain 08/08/2012  . Gout, unspecified 12/06/2008  . Esophageal reflux 12/06/2008  . BACK PAIN 12/06/2008  . FIBROIDS, UTERUS 11/24/2008  . ESRD (end stage renal disease) on dialysis ([place]) 07/01/2006    Past Surgical History:  Procedure Laterality Date  . AORTIC VALVE REPLACEMENT N/A 02/25/2016   Procedure: AORTIC VALVE REPLACEMENT (AVR) implanted with Magna Ease Aortic valve size 38mm;  Surgeon: Melrose Nakayama, MD;  Location: [facility];  Service: Open Heart Surgery;  Laterality: N/A;  . [place] Right 02/17/2016   Procedure: REMOVAL OF TWO ARTERIOVENOUS GORETEX GRAFTS ([place]);  Surgeon: Angelia Mould, MD;  Location: [place];  Service: Vascular;  Laterality: Right;  . DG  AV DIALYSIS GRAFT DECLOT OR    . DILATATION & CURETTAGE/HYSTEROSCOPY WITH TRUECLEAR N/A 11/06/2012   Procedure: DILATATION & CURETTAGE/HYSTEROSCOPY WITH TRUECLEAR;  Surgeon: Terrance Mass, MD;  Location: [facility];  Service: Gynecology;  Laterality: N/A;  Truclear Resectoscopic Polypectomy   . INSERTION OF DIALYSIS CATHETER N/A 02/19/2016   Procedure: INSERTION OF Left Internal Jugular DIALYSIS CATHETER;  Surgeon: Angelia Mould, MD;  Location: [facility];  Service: Vascular;   Laterality: N/A;  . PATCH ANGIOPLASTY Right 02/17/2016   Procedure: PATCH ANGIOPLASTY;  Surgeon: Angelia Mould, MD;  Location: [facility];  Service: Vascular;  Laterality: Right;  . PERIPHERAL VASCULAR CATHETERIZATION N/A 09/14/2014   Procedure: A/V Shuntogram/Fistulagram;  Surgeon: Katha Cabal, MD;  Location: [facility];  Service: Cardiovascular;  Laterality: N/A;  . PERIPHERAL VASCULAR CATHETERIZATION N/A 09/14/2014   Procedure: A/V Shunt Intervention;  Surgeon: Katha Cabal, MD;  Location: [facility] [facility];  Service: Cardiovascular;  Laterality: N/A;  . PERIPHERAL VASCULAR CATHETERIZATION Right 12/09/2014   Procedure: A/V Shuntogram/Fistulagram;  Surgeon: Algernon Huxley, MD;  Location: [place] [facility];  Service: Cardiovascular;  Laterality: Right;  . PERIPHERAL VASCULAR CATHETERIZATION N/A 12/09/2014   Procedure: A/V Shunt Intervention;  Surgeon: Algernon Huxley, MD;  Location: [facility];  Service: Cardiovascular;  Laterality: N/A;  . PERIPHERAL VASCULAR CATHETERIZATION Right 05/24/2015   Procedure: A/V Shuntogram;  Surgeon: Serafina Mitchell, MD;  Location: [facility];  Service: Cardiovascular;  Laterality: Right;  . PERIPHERAL VASCULAR CATHETERIZATION Right 05/24/2015   Procedure: Peripheral Vascular Balloon Angioplasty;  Surgeon: Serafina Mitchell, MD;  Location: [facility] [facility];  Service: Cardiovascular;  Laterality: Right;  right arm shunt  . PERIPHERAL VASCULAR CATHETERIZATION N/A 06/13/2015   Procedure: A/V Shuntogram/Fistulagram;  Surgeon: Algernon Huxley, MD;  Location: [facility];  Service: Cardiovascular;  Laterality: N/A;  . PERIPHERAL VASCULAR CATHETERIZATION N/A 06/13/2015   Procedure: A/V Shunt Intervention;  Surgeon: Algernon Huxley, MD;  Location: [place] [facility];  Service: Cardiovascular;  Laterality: N/A;  . TEE WITHOUT CARDIOVERSION N/A 02/22/2016   Procedure: TRANSESOPHAGEAL ECHOCARDIOGRAM (TEE);  Surgeon: Pixie Casino, MD;   Location[facility];  Service: Cardiovascular;  Laterality: N/A;  . TEE WITHOUT CARDIOVERSION N/A 02/25/2016   Procedure: TRANSESOPHAGEAL ECHOCARDIOGRAM (TEE);  Surgeon: Melrose Nakayama, MD;  Location: [place];  Service: Open Heart Surgery;  Laterality: N/A;  . TUBAL LIGATION  1983    OB History    Gravida Para Term Preterm AB Living   2 2       2    SAB TAB Ectopic Multiple Live Births                   Home Medications    Prior to Admission medications   Medication Sig Start Date End Date Taking? Authorizing Provider  aspirin EC 325 MG EC tablet Take 1 tablet (325 mg total) by mouth daily. 03/06/16  Yes Tatia Petrucci R Barrett, PA-C  diclofenac sodium (VOLTAREN) 1 % GEL Apply 1 application topically 4 (four) times daily. Prn neck muscle pain. Patient taking differently: Apply 1 application topically every 4 (four) hours as needed (for pain). Prn neck muscle pain. 02/09/16  Yes Janne Napoleon, NP  famotidine (PEPCID) 20 MG tablet Take 20 mg by mouth daily.    Yes Historical Provider, MD  megestrol (MEGACE) 40 MG tablet Take 1 tablet (40 mg total) by mouth 2 (two) times daily. 03/13/16  Yes Terrance Mass, MD  midodrine (PROAMATINE) 10 MG tablet Take 10 mg by mouth 2 (two) times daily.    Yes Historical Provider, MD  Nutritional Supplements (NEPRO) LIQD Take 237 mLs by mouth daily.   Yes Historical Provider, MD  oseltamivir (TAMIFLU) 75 MG capsule Take 75 mg by mouth daily. For 10 days   Yes Historical Provider, MD  oxyCODONE (OXY IR/ROXICODONE) 5 MG immediate release tablet Take 1-2 tablets (5-10 mg total) by mouth every 3 (three) hours as needed for severe pain. Patient taking differently: Take 5-10 mg by mouth See admin instructions. 5 mg every three hours AS NEEDED for moderate pain or 10 mg for severe pain 03/06/16  Yes Primitivo Merkey R Barrett, PA-C  oxycodone (OXY-IR) 5 MG capsule Take 5 mg by mouth 2 (two) times daily.   Yes Historical Provider, MD  oxyCODONE-acetaminophen (PERCOCET/ROXICET) 5-325  MG tablet Take 1 tablet by mouth every 6 (six) hours as needed (for pain).   Yes Historical Provider, MD  rifampin (RIFADIN) 300 MG capsule Take 1 capsule (300 mg total) by mouth every 12 (twelve) hours. 03/06/16 03/31/16 Yes Shizue Kaseman R Barrett, PA-C  sevelamer (RENVELA) 800 MG tablet Take 800 mg by mouth 3 (three) times daily with meals.    Yes Historical Provider, MD  ceFAZolin (ANCEF) 2-4 GM/100ML-% IVPB Inject 100 mLs (2 g total) into the vein every Monday, Wednesday, and Friday at 6 PM. During dialysis Patient not taking: Reported on 03/26/2016 03/07/16 04/07/16  Elexus Barman R Barrett, PA-C  cinacalcet (SENSIPAR) 60 MG tablet Take 60 mg by mouth at bedtime.     Historical Provider, MD  silver sulfADIAZINE (SILVADENE) 1 % cream Apply 1 application topically 2 (two) times daily as needed (wound).    Historical Provider, MD    Family History Family History  Problem Relation Age of Onset  . Diabetes Mother   . Hypertension Mother   . Heart disease Mother   . Alcohol abuse Mother   . Kidney disease Mother   . Cancer Father     COLON  . Alcohol abuse Father   . Breast cancer Sister 63  . Hypertension Sister   . Kidney disease Sister   . Hypertension Son     Social History Social History  Substance Use Topics  . Smoking status: Never Smoker  . Smokeless tobacco: Never Used  . Alcohol use No     Allergies   Contrast media [iodinated diagnostic agents] and Naproxen sodium   Review of Systems Review of Systems  Constitutional: Positive for fatigue. Negative for fever.  HENT: Negative for sore throat.   Eyes: Negative for visual disturbance.  Respiratory: Negative for cough and shortness of breath.   Cardiovascular: Negative for chest pain.  Gastrointestinal: Negative for abdominal pain.  Genitourinary: Negative for difficulty urinating (does not make urine).  Musculoskeletal: Negative for back pain and neck pain.  Skin: Negative for rash.  Neurological: Negative for syncope and headaches.       Physical Exam Updated Vital Signs BP 117/65   Pulse 85   Temp 97.6 F (36.4 C) (Oral)   Resp 18   SpO2 100%   Physical Exam  Constitutional: She is oriented to person, place, and time. She appears well-developed and well-nourished. No distress.  HENT:  Head: Normocephalic and atraumatic.  Eyes: Conjunctivae and EOM are normal.  Neck: Normal range of motion.  Cardiovascular: Normal rate, regular rhythm, normal heart sounds and intact distal pulses.  Exam reveals no gallop and no  friction rub.   No murmur heard. Pulmonary/Chest: Effort normal and breath sounds normal. No respiratory distress. She has no wheezes. She has no rales.  Cathter in place  Abdominal: Soft. She exhibits no distension. There is no tenderness. There is no guarding.  Musculoskeletal: She exhibits no edema or tenderness.  Right forearm prior graft, no erythema  Neurological: She is alert and oriented to person, place, and time.  Skin: Skin is warm and dry. No rash noted. She is not diaphoretic. No erythema.  Nursing note and vitals reviewed.    ED Treatments / Results  Labs (all labs ordered are listed, but only abnormal results are displayed) Labs Reviewed  BASIC METABOLIC PANEL - Abnormal; Notable for the following:       Result Value   Sodium 134 (*)    Potassium 3.0 (*)    Chloride 93 (*)    Glucose, Bld 102 (*)    BUN <5 (*)    Creatinine, Ser 2.77 (*)    Calcium 8.2 (*)    GFR calc non Af Amer 17 (*)    GFR calc Af Amer 20 (*)    All other components within normal limits  CBC - Abnormal; Notable for the following:    RBC 3.53 (*)    Hemoglobin 9.9 (*)    HCT 31.3 (*)    RDW 17.6 (*)    All other components within normal limits  URINALYSIS, ROUTINE W REFLEX MICROSCOPIC  CBG MONITORING, ED    EKG  EKG Interpretation  Date/Time:  Monday March 26 2016 18:56:38 EST Ventricular Rate:  0 PR Interval:    QRS Duration: 0 QT Interval:  0 QTC Calculation: 0 R Axis:   0 Text  Interpretation:  NO CHARGE Confirmed by [facility] MD, Junie Panning ([id number]) on 03/26/2016 11:31:11 PM       Radiology No results found.  Procedures Procedures (including critical care time)  Medications Ordered in ED Medications - No data to display   Initial Impression / Assessment and Plan / ED Course  I have reviewed the triage vital signs and the nursing notes.  Pertinent labs & imaging results that were available during my care of the patient were reviewed by me and considered in my medical decision making (see chart for details).     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  Patient denies any acute pain from the fall, specifically denies head trauma, headache, vomiting, extremity pain, or new numbness or weakness.   She has no history to suggest an infection, acute bleed, or acute injury. Her BMP shows mild hypokalemia, however in setting of dialysis patient will hold off on potassium replacement. Her bicarbonate is within normal limits, her white count is normal, her hemoglobin is improving. I do not see any indication for medical admission at this time.  Consulted social work and case management regarding patient who continues to have deconditioning after her 20 day stay in rehab. Patient reports  she was gaining strength however was not ready for discharge today.  Social work and case management recommend all outpatient resources, including  physical therapy, home health CNA, RN, occupational therapy and social work. Placed the face-to-face order for these and recommended patient stay with family to assist her during this time. Feel she is medically stable for discharge from ED and PCP follow up.  Final Clinical Impressions(s) / ED Diagnoses   Final diagnoses:  Physical deconditioning  Generalized weakness    New Prescriptions Discharge Medication List as of 03/26/2016 11:32 PM       Gareth Morgan, MD 03/27/16 0230

## 2016-03-28 ENCOUNTER — Other Ambulatory Visit: Payer: Self-pay

## 2016-03-28 DIAGNOSIS — N039 Chronic nephritic syndrome with unspecified morphologic changes: Secondary | ICD-10-CM | POA: Diagnosis not present

## 2016-03-28 DIAGNOSIS — D509 Iron deficiency anemia, unspecified: Secondary | ICD-10-CM | POA: Diagnosis not present

## 2016-03-28 DIAGNOSIS — N186 End stage renal disease: Secondary | ICD-10-CM | POA: Diagnosis not present

## 2016-03-28 DIAGNOSIS — Z992 Dependence on renal dialysis: Secondary | ICD-10-CM | POA: Diagnosis not present

## 2016-03-28 DIAGNOSIS — N2581 Secondary hyperparathyroidism of renal origin: Secondary | ICD-10-CM | POA: Diagnosis not present

## 2016-03-28 DIAGNOSIS — D631 Anemia in chronic kidney disease: Secondary | ICD-10-CM | POA: Diagnosis not present

## 2016-03-29 ENCOUNTER — Other Ambulatory Visit: Payer: Self-pay | Admitting: Licensed Clinical Social Worker

## 2016-03-29 NOTE — Patient Outreach (Signed)
[facility]) Care Management  [facility] Social Work  03/29/2016  [name] Marks Dec 11, 1955 [id number]  Encounter Medications:  Outpatient Encounter Prescriptions as of 03/29/2016  Medication Sig Note  . aspirin EC 325 MG EC tablet Take 1 tablet (325 mg total) by mouth daily.   Marland Kitchen ceFAZolin (ANCEF) 2-4 GM/100ML-% IVPB Inject 100 mLs (2 g total) into the vein every Monday, Wednesday, and Friday at 6 PM. During dialysis (Patient not taking: Reported on 03/26/2016) 03/26/2016: NOT ON CURRENT MAR  . cinacalcet (SENSIPAR) 60 MG tablet Take 60 mg by mouth at bedtime.  03/26/2016: NOT ON CURRENT MAR (looks like it was d/c'd on 03/12/16, per [facility])  . diclofenac sodium (VOLTAREN) 1 % GEL Apply 1 application topically 4 (four) times daily. Prn neck muscle pain. (Patient taking differently: Apply 1 application topically every 4 (four) hours as needed (for pain). Prn neck muscle pain.) 03/26/2016: PRN, per MAR  . famotidine (PEPCID) 20 MG tablet Take 20 mg by mouth daily.    . megestrol (MEGACE) 40 MG tablet Take 1 tablet (40 mg total) by mouth 2 (two) times daily.   . midodrine (PROAMATINE) 10 MG tablet Take 10 mg by mouth 2 (two) times daily.    . Nutritional Supplements (NEPRO) LIQD Take 237 mLs by mouth daily.   Marland Kitchen oseltamivir (TAMIFLU) 75 MG capsule Take 75 mg by mouth daily. For 10 days 03/26/2016: Start date 03/22/16 to last 10 days  . oxyCODONE (OXY IR/ROXICODONE) 5 MG immediate release tablet Take 1-2 tablets (5-10 mg total) by mouth every 3 (three) hours as needed for severe pain. (Patient taking differently: Take 5-10 mg by mouth See admin instructions. 5 mg every three hours AS NEEDED for moderate pain or 10 mg for severe pain) 03/26/2016: PRN, per MAR  . oxycodone (OXY-IR) 5 MG capsule Take 5 mg by mouth 2 (two) times daily. 03/26/2016: 0800 and 2000 per MAR (SCHEDULED)  . oxyCODONE-acetaminophen (PERCOCET/ROXICET) 5-325 MG tablet Take 1 tablet by mouth every 6 (six) hours as needed (for  pain).   . rifampin (RIFADIN) 300 MG capsule Take 1 capsule (300 mg total) by mouth every 12 (twelve) hours.   . sevelamer (RENVELA) 800 MG tablet Take 800 mg by mouth 3 (three) times daily with meals.    . silver sulfADIAZINE (SILVADENE) 1 % cream Apply 1 application topically 2 (two) times daily as needed (wound). 03/26/2016: NOT ON CURRENT MAR   No facility-administered encounter medications on file as of 03/29/2016.     Functional Status:  In your present state of health, do you have any difficulty performing the following activities: 03/29/2016 02/16/2016  Hearing? N -  Vision? Y -  Difficulty concentrating or making decisions? N -  Walking or climbing stairs? Y -  Dressing or bathing? Y -  Doing errands, shopping? Y N  Some recent data might be hidden    Fall/Depression Screening:  PHQ 2/9 Scores 03/29/2016 03/27/2016 10/28/2014 01/08/2013  PHQ - 2 Score 0 0 0 0    Assessment: CSW arrived at patient's home in order to complete home visit. Patient has a history of hypotension, aortic valve vegetation and end state renal disease.  Patient had friend from church over who has been coming over daily to assist her since she returned home and will coming back tomorrow as well. Patient had a phone call before session with a family member who is going to bring her fish and fries for lunch. Patient reports that she has no issues  affording food. Patient reports that she has several people that come by to drop off food if needed. She reports that she has family members that can assist with grocery shopping as well. CSW questioned how she was doing. Patient reports that she is doing better and was able to go to dialysis yesterday. She reports that her son transported her to dialysis. She reports that she uses [facility] when he is able not able to transport her. She confirms that her support network is very strong and includes: her daughter, her son, her friends, distant relatives and church members. She reports  that she has been able to get up and walk some. She states that she is going to the bathroom by herself. She states that her main social work need is gaining a ramp so that she can easily ambulate out of her home to go to medical appointments.   CSW has not heard back from [employer]. CSW completed call to program and was left another message with the front desk. CSW was informed that I should receive a call back by tomorrow. Patient owns her home and will therefore be eligible for program through [facility] contract.   Patient denies experiencing any pain. She reports that her appetite is "sometimes low." She states that she is sleeping very well. She states that she attends dialysis Mondays, Wednesdays and Fridays. She states that she is taking her medications herself without any problems. She denies needing Anna involvement at this time. She states that she has a walker, wheelchair, elevated toiely seat and cane. She does not have scales. CSW will send request to [place] [employer] Assistant to mail scales as patient is in need of them. Patient reports that she gains socialization by attending church and spending time with family and friends. Patient reports her income is "around $10,460." Patient reports that her husband passed away in 06/08/06 but that they were not together at that time and she does not need grief resources. She denies experiencing any depressive symptoms. She denies wishing to complete an advance directive.   [facility] RN arrived at the end of the session. Tomorrow patient will have an aide come to her home to assist with bathing. Patient reports that physical therapy should start next week.  Plan: CSW will await to hear back from [employer]. CSW will route encounter to PCP. CSW will follow up with patient within two weeks.  THN CM Care Plan Problem One   Flowsheet Row Most Recent Value  Care Plan Problem One  Safety and transportation  concerns  Role Documenting the Problem One  Clinical Social Worker  Care Plan for Problem One  Active  THN Long Term Goal (31-90 days)  Patient will be able to go to all medical appointments within the next 90 days as evidenced by patient report  [place] Term Goal Start Date  03/27/16  Interventions for Problem One Long Term Goal  Patient is unable to ambulate and cannot get out of her home and therefore cannot go to dialysis or medical appointments. Patient is in need of a ramp and transportation assistance. CSW will complete home visit and provide community resources, make referrals if needed and provide support and encouragement.  THN CM Short Term Goal #1 (0-30 days)  Patient will complete application for [employer] within 30 days so that she can eventually have a ramp as evidenced by program and patient report to CSW.  THN CM Short Term  Goal #1 Start Date  03/29/16  Interventions for Short Term Goal #1  CSW will successfully complete referral for [employer] and will follow up as needed.      [name], BSW, MSW, [email or web address] Phone: [phone] Fax: [phone]

## 2016-03-29 NOTE — Patient Outreach (Signed)
    Unsuccessful attempt made to contact patient via telephone. Will make another attempt to contact patient within the next 14 days.

## 2016-03-30 ENCOUNTER — Other Ambulatory Visit: Payer: Self-pay

## 2016-03-30 DIAGNOSIS — D509 Iron deficiency anemia, unspecified: Secondary | ICD-10-CM | POA: Diagnosis not present

## 2016-03-30 DIAGNOSIS — D631 Anemia in chronic kidney disease: Secondary | ICD-10-CM | POA: Diagnosis not present

## 2016-03-30 DIAGNOSIS — N186 End stage renal disease: Secondary | ICD-10-CM | POA: Diagnosis not present

## 2016-03-30 DIAGNOSIS — N2581 Secondary hyperparathyroidism of renal origin: Secondary | ICD-10-CM | POA: Diagnosis not present

## 2016-04-02 ENCOUNTER — Other Ambulatory Visit: Payer: Self-pay

## 2016-04-02 ENCOUNTER — Other Ambulatory Visit: Payer: Self-pay | Admitting: Licensed Clinical Social Worker

## 2016-04-02 DIAGNOSIS — N2581 Secondary hyperparathyroidism of renal origin: Secondary | ICD-10-CM | POA: Diagnosis not present

## 2016-04-02 DIAGNOSIS — N186 End stage renal disease: Secondary | ICD-10-CM | POA: Diagnosis not present

## 2016-04-02 DIAGNOSIS — D631 Anemia in chronic kidney disease: Secondary | ICD-10-CM | POA: Diagnosis not present

## 2016-04-02 DIAGNOSIS — D509 Iron deficiency anemia, unspecified: Secondary | ICD-10-CM | POA: Diagnosis not present

## 2016-04-02 NOTE — Patient Outreach (Signed)
[facility]) Care Management  04/02/2016  [name] Marks Jul 08, 1955 [id number]   Assessment- CSW has not received return call from [facility] with [employer] after two attempts. CSW completed an additional outreach call ton 04/02/16 but was unable to reach [facility]. CSW left a HIPPA compliant voice message encouraging return call.  Plan-CSW will await to hear back from [employer] or complete an additional outreach.  [name], BSW, MSW, [email or web address] Phone: [phone] Fax: [phone]

## 2016-04-02 NOTE — Patient Outreach (Signed)
[facility]) Care Management  04/02/2016  [name] Marks 12/18/1955 [id number]   Assessment- CSW received return call from [facility] with [employer]. However, CSW was out of the office for lunch and did not have the information to make referral. [name] stated that CSW could contact her back later in the day and leave information on voice message. CSW did this. [name] will directly contact patient and then will send out intake packet.  Plan-CSW will follow up with patient within one week and assist with gaining a ramp.  [name], BSW, MSW, [email or web address] Phone: [phone] Fax: [phone]

## 2016-04-02 NOTE — Patient Outreach (Signed)
    Unsuccessful telephone contact attempt made HIPPA compliant message left for patient with this RNCM's contact information and request for return   Plan:  Make another attempt to contact patient within the next 14 days.

## 2016-04-03 ENCOUNTER — Ambulatory Visit: Payer: Medicare Other | Admitting: Thoracic Surgery (Cardiothoracic Vascular Surgery)

## 2016-04-03 ENCOUNTER — Ambulatory Visit: Payer: Medicare Other

## 2016-04-03 NOTE — Patient Outreach (Signed)
    Successful outreach call made to patient to schedule home visit for assessment of community care coordination. Patient agreed to meet with this RNCM, stated she would think about some goals to work on.  Plan: Meet with patient in the next 21 days for community care coordination

## 2016-04-04 ENCOUNTER — Other Ambulatory Visit: Payer: Self-pay | Admitting: Licensed Clinical Social Worker

## 2016-04-04 DIAGNOSIS — N2581 Secondary hyperparathyroidism of renal origin: Secondary | ICD-10-CM | POA: Diagnosis not present

## 2016-04-04 DIAGNOSIS — N186 End stage renal disease: Secondary | ICD-10-CM | POA: Diagnosis not present

## 2016-04-04 DIAGNOSIS — D509 Iron deficiency anemia, unspecified: Secondary | ICD-10-CM | POA: Diagnosis not present

## 2016-04-04 DIAGNOSIS — D631 Anemia in chronic kidney disease: Secondary | ICD-10-CM | POA: Diagnosis not present

## 2016-04-04 NOTE — Patient Outreach (Signed)
[facility]) Care Management  04/04/2016  Raetta A [name] 08-Mar-1955 [id number]   Assessment- CSW received return voice message from [facility] with [employer]. She reports that she contacted patient and initiated contact.   Plan-CSW will follow up within one week and assist patient with gaining a ramp.  [name], BSW, MSW, [email or web address] Phone: [phone] Fax: [phone]

## 2016-04-05 ENCOUNTER — Other Ambulatory Visit: Payer: Self-pay

## 2016-04-05 ENCOUNTER — Encounter: Payer: Self-pay | Admitting: Physician Assistant

## 2016-04-05 ENCOUNTER — Other Ambulatory Visit: Payer: Self-pay | Admitting: Vascular Surgery

## 2016-04-05 DIAGNOSIS — Z01810 Encounter for preprocedural cardiovascular examination: Secondary | ICD-10-CM

## 2016-04-05 DIAGNOSIS — T82511D Breakdown (mechanical) of surgically created arteriovenous shunt, subsequent encounter: Secondary | ICD-10-CM

## 2016-04-06 DIAGNOSIS — D631 Anemia in chronic kidney disease: Secondary | ICD-10-CM | POA: Diagnosis not present

## 2016-04-06 DIAGNOSIS — N186 End stage renal disease: Secondary | ICD-10-CM | POA: Diagnosis not present

## 2016-04-06 DIAGNOSIS — N2581 Secondary hyperparathyroidism of renal origin: Secondary | ICD-10-CM | POA: Diagnosis not present

## 2016-04-06 DIAGNOSIS — D509 Iron deficiency anemia, unspecified: Secondary | ICD-10-CM | POA: Diagnosis not present

## 2016-04-06 NOTE — Patient Outreach (Signed)
[facility]) Care Management  04/05/2016  Sueellen A [name] 12-01-1955 [id number]   This RNCM arrived at patient's home for this initial home visit. Patient's house was being repaired, hard to reach. Patient was also receiving assistance with her ADLs from aide from [facility]  Due to lack of privacy and distractions, this RNCM and patient agreed to reschedule this home visit . Plan: Reschedule home visit in the next 21 day range.

## 2016-04-09 DIAGNOSIS — N2581 Secondary hyperparathyroidism of renal origin: Secondary | ICD-10-CM | POA: Diagnosis not present

## 2016-04-09 DIAGNOSIS — D631 Anemia in chronic kidney disease: Secondary | ICD-10-CM | POA: Diagnosis not present

## 2016-04-09 DIAGNOSIS — N186 End stage renal disease: Secondary | ICD-10-CM | POA: Diagnosis not present

## 2016-04-09 DIAGNOSIS — D509 Iron deficiency anemia, unspecified: Secondary | ICD-10-CM | POA: Diagnosis not present

## 2016-04-11 ENCOUNTER — Other Ambulatory Visit: Payer: Self-pay | Admitting: Licensed Clinical Social Worker

## 2016-04-11 DIAGNOSIS — D631 Anemia in chronic kidney disease: Secondary | ICD-10-CM | POA: Diagnosis not present

## 2016-04-11 DIAGNOSIS — N2581 Secondary hyperparathyroidism of renal origin: Secondary | ICD-10-CM | POA: Diagnosis not present

## 2016-04-11 DIAGNOSIS — D509 Iron deficiency anemia, unspecified: Secondary | ICD-10-CM | POA: Diagnosis not present

## 2016-04-11 DIAGNOSIS — N186 End stage renal disease: Secondary | ICD-10-CM | POA: Diagnosis not present

## 2016-04-11 NOTE — Patient Outreach (Signed)
[facility]) Care Management  04/11/2016  [name] Marks Nov 29, 1955 [id number]   Assessment- CSW completed outreach to patient and she answered. CSW questioned if she received packet from [employer] in regards to ramp. She reports that she has not received packet but that "someone" came by and put up a rail at the front of her house which has assisted her. She is unable to tell which agency assisted with this. CSW questioned if she could complete home visit this week but she declined stating "I am getting overwhelmed with everyone coming by and calling right now." CSW will follow up within two weeks.  CSW contacted [employer] and left a message with [employer] and left a voice message encouraging return call once available.  Plan-CSW will follow up within two weeks and continue to provide social work assistance.  [name], BSW, MSW, [email or web address] Phone: [phone] Fax: [phone]

## 2016-04-12 ENCOUNTER — Other Ambulatory Visit: Payer: Self-pay

## 2016-04-12 ENCOUNTER — Telehealth: Payer: Self-pay

## 2016-04-12 ENCOUNTER — Other Ambulatory Visit: Payer: Self-pay | Admitting: Licensed Clinical Social Worker

## 2016-04-12 NOTE — Patient Outreach (Signed)
[facility]) Care Management  04/12/2016  [name] Marks June 24, 1955 [id number]  Assessment- CSW received return call from [facility] with [employer]. She reports that their construction workers with to her home and that she arranged that visit with her beforehand. She reports that the construction works felt like what she had in place outside her home was unsafe and they tore it down and then they put up a temporary ramp which will assist her. She reports that this temporary ramp will help with safety. [name] Marks states that she mailed out the packet that patient has to complete and retrieve needed documents out on 04/06/16. She reports that it will be the patient's responsibility to complete and send back in so that she can be put on the list to gain a new ramp. CSW informed her that her family checks her mail and have not found packet yet but that she informed them to keep a look out for document.  Plan-CSW will follow up with patient within 1 week and continue to assist her in gaining a new ramp.  [name] Marks, BSW, MSW, [name] Phone: [phone] Fax: [phone]

## 2016-04-12 NOTE — Patient Outreach (Signed)
[name]) Care Management  04/12/2016  [name] Marks 03-22-1955 [id number]   Assessment- CSW received voice message from [facility] requesting return call. CSW completed return call but was unable to reach her. CSW left a HIPPA compliant voice message encouraging return call in order to discuss patient's case.  Plan-CSW will await for return call from [name] with [employer] or will make an additional attempt to reach her.  [name], BSW, MSW, [email or web address] Phone: [phone] Fax: [phone]

## 2016-04-12 NOTE — Telephone Encounter (Signed)
Hi ladies,   Brynda Rim. PA said this pt needs an appt in the next 1 month with Dr. Radford Pax or another provider on Dr. Theodosia Blender Care team. Please call the pt with an appt.   Thank you  Arbie Cookey

## 2016-04-12 NOTE — Telephone Encounter (Signed)
Please call patient and try to schedule her for follow up when she can in the next 1 month. Dr. Fransico Him is her Cardiologist.  She can see her or anyone on her team. Richardson Dopp, PA-C   04/12/2016 4:58 PM

## 2016-04-12 NOTE — Telephone Encounter (Signed)
Santiago Glad (home health nurse) was calling to let us know that [name] Marks has not been taking her medications. Home health has been seeing her weekly x 3 weeks, next week will be her last visit. Santiago Glad states that all of her surgical incisions look good. [name] Marks canceled her appt with Dr Roxan Hockey on 04/03/16 due to no transportation and she also NS' ed her appt with Cardiology Richardson Dopp C-NP) and did not rescheduled.  I called [name] Marks and she did confirm that she has not been taking all of her medications daily. I was able to reschedule her to see Dr Roxan Hockey on 05/08/2016 with CXR, She said she would try and arrange transportation by then.  She has been going to dialysis M/W/Friday's. And also being followed by [facility].

## 2016-04-13 ENCOUNTER — Other Ambulatory Visit: Payer: Self-pay

## 2016-04-13 DIAGNOSIS — N186 End stage renal disease: Secondary | ICD-10-CM | POA: Diagnosis not present

## 2016-04-13 DIAGNOSIS — D509 Iron deficiency anemia, unspecified: Secondary | ICD-10-CM | POA: Diagnosis not present

## 2016-04-13 DIAGNOSIS — N2581 Secondary hyperparathyroidism of renal origin: Secondary | ICD-10-CM | POA: Diagnosis not present

## 2016-04-13 DIAGNOSIS — D631 Anemia in chronic kidney disease: Secondary | ICD-10-CM | POA: Diagnosis not present

## 2016-04-13 NOTE — Telephone Encounter (Signed)
Called patient phone just rang no voicemail came on. Will try patient at a later time. Nat Christen, CMA

## 2016-04-13 NOTE — Patient Outreach (Signed)
    Telephone contact as follow up with patient to schedule home visit. Patient agrees to home visit next week.  Plan: Home visit with patient within the next 14 days for community care coordination

## 2016-04-13 NOTE — Patient Outreach (Signed)
    Telephone contact made with patient to schedule home visit. Patient requested call on tomorrow after she had a chance to review her schedule.  Plan: Telephone contact with patient in the next 7 days to schedule home viist

## 2016-04-13 NOTE — Telephone Encounter (Signed)
Please call patient and let her know that she needs to make an appointment with Dr. Radford Pax per notes below. Please let her know that she should also make an appointment with me in the next month. Thank Gerline Ratto

## 2016-04-16 DIAGNOSIS — D509 Iron deficiency anemia, unspecified: Secondary | ICD-10-CM | POA: Diagnosis not present

## 2016-04-16 DIAGNOSIS — N186 End stage renal disease: Secondary | ICD-10-CM | POA: Diagnosis not present

## 2016-04-16 DIAGNOSIS — D631 Anemia in chronic kidney disease: Secondary | ICD-10-CM | POA: Diagnosis not present

## 2016-04-16 DIAGNOSIS — N2581 Secondary hyperparathyroidism of renal origin: Secondary | ICD-10-CM | POA: Diagnosis not present

## 2016-04-16 NOTE — Telephone Encounter (Signed)
Pt scheduled to see Dr. Radford Pax 05/17/16

## 2016-04-17 ENCOUNTER — Encounter: Payer: Self-pay | Admitting: Licensed Clinical Social Worker

## 2016-04-17 ENCOUNTER — Other Ambulatory Visit: Payer: Self-pay

## 2016-04-17 ENCOUNTER — Other Ambulatory Visit: Payer: Self-pay | Admitting: Licensed Clinical Social Worker

## 2016-04-17 NOTE — Telephone Encounter (Signed)
This encounter was created in error - please disregard.

## 2016-04-17 NOTE — Patient Outreach (Signed)
[facility]) Care Management   04/17/2016  [name] Marks 23-Jun-1955 [id number]  [name] Marks is an 61 y.o. female  Subjective:  I am doing so much better. I am working with home health  Objective:  Elderly, well nourished lady with neat short cut hair.  ROS  Physical Exam ROS  Encounter Medications:   Outpatient Encounter Prescriptions as of 04/17/2016  Medication Sig Note  . aspirin EC 325 MG EC tablet Take 1 tablet (325 mg total) by mouth daily.   . cinacalcet (SENSIPAR) 60 MG tablet Take 60 mg by mouth at bedtime.  03/26/2016: NOT ON CURRENT MAR (looks like it was d/c'd on 03/12/16, per [facility])  . diclofenac sodium (VOLTAREN) 1 % GEL Apply 1 application topically 4 (four) times daily. Prn neck muscle pain. (Patient taking differently: Apply 1 application topically every 4 (four) hours as needed (for pain). Prn neck muscle pain.) 03/26/2016: PRN, per MAR  . famotidine (PEPCID) 20 MG tablet Take 20 mg by mouth daily.    . megestrol (MEGACE) 40 MG tablet Take 1 tablet (40 mg total) by mouth 2 (two) times daily.   . midodrine (PROAMATINE) 10 MG tablet Take 10 mg by mouth 2 (two) times daily.    . Nutritional Supplements (NEPRO) LIQD Take 237 mLs by mouth daily.   Marland Kitchen oseltamivir (TAMIFLU) 75 MG capsule Take 75 mg by mouth daily. For 10 days 03/26/2016: Start date 03/22/16 to last 10 days  . oxyCODONE (OXY IR/ROXICODONE) 5 MG immediate release tablet Take 1-2 tablets (5-10 mg total) by mouth every 3 (three) hours as needed for severe pain. (Patient taking differently: Take 5-10 mg by mouth See admin instructions. 5 mg every three hours AS NEEDED for moderate pain or 10 mg for severe pain) 03/26/2016: PRN, per MAR  . oxycodone (OXY-IR) 5 MG capsule Take 5 mg by mouth 2 (two) times daily. 03/26/2016: 0800 and 2000 per MAR (SCHEDULED)  . oxyCODONE-acetaminophen (PERCOCET/ROXICET) 5-325 MG tablet Take 1 tablet by mouth every 6 (six) hours as needed (for pain).   Marland Kitchen sevelamer  (RENVELA) 800 MG tablet Take 800 mg by mouth 3 (three) times daily with meals.    . silver sulfADIAZINE (SILVADENE) 1 % cream Apply 1 application topically 2 (two) times daily as needed (wound). 03/26/2016: NOT ON CURRENT MAR   No facility-administered encounter medications on file as of 04/17/2016.     Functional Status:   In your present state of health, do you have any difficulty performing the following activities: 04/02/2016 03/29/2016  Hearing? N N  Vision? Y Y  Difficulty concentrating or making decisions? N N  Walking or climbing stairs? Y Y  Dressing or bathing? Y Y  Doing errands, shopping? Tempie Donning  Preparing Food and eating ? Y -  Using the Toilet? N -  In the past six months, have you accidently leaked urine? N -  Do you have problems with loss of bowel control? N -  Managing your Medications? N -  Managing your Finances? N -  Housekeeping or managing your Housekeeping? Y -  Some recent data might be hidden    Fall/Depression Screening:    PHQ 2/9 Scores 04/02/2016 03/29/2016 03/27/2016 10/28/2014 01/08/2013  PHQ - 2 Score 0 0 0 0 0   THN CM Care Plan Problem One   Flowsheet Row Most Recent Value  Care Plan Problem One  (P) Patient is deconditioned   Role Documenting the Problem One  (P) Care Management Coordinator  Care Plan for Problem One  (P) Active  THN Long Term Goal (31-90 days)  (P) In the next 31 patient will meet with [facility] RNCM to assess need for community care coordination  THN Long Term Goal Start Date  (P) 04/02/16  Interventions for Problem One Long Term Goal  (P) Routine home visit for community care coordination  THN CM Short Term Goal #1 (0-30 days)  (P) Patient will meet with   [facility] CM Short Term Goal #1 Start Date  (P) 04/17/16  Interventions for Short Term Goal #1  (P) routine home visit to follow up on patient's access of the community resources     Assessment:   Patient was working with OT from home health agency [facility]) Patient appeared more  alert, very pleasant and cooperative.  Plan:  Telephone contact in the next 21 days for community care coordination, ass her progress in meeting case management goals

## 2016-04-17 NOTE — Addendum Note (Signed)
Addended by: Tobi Bastos on: 04/17/2016 07:15 PM   Modules accepted: Level of Service, SmartSet

## 2016-04-17 NOTE — Patient Outreach (Signed)
[facility]) Care Management  04/17/2016  [name] Marks 1955/12/01 [id number]  Assessment- CSW completed outreach to patient and she answered. She reports that another agency came by today and educated her on how to get a ramp through their agency. She is not willing to provide name of this agency to [place]. She states that she has had 3 different agencies contact her in regards to gaining a ramp and she is unsure which one to go with. Patient has a temporary ramp outside of her residence at this time. Patient reports that there is a lot of confusion in regards to the ramp because several people have been contacting her. CSW provided education on [employer] and their program and informed her that they are the ones that did the temporary ramp. She states that no one discussed getting a temporary ramp and that they did not come into her home and discuss what they were going to do which made her upset. CSW informed her that [facility] had scheduled this arrangement with her on 04/06/16. CSW encouraged her to contact [employer] with [employer] to gain further information. Patient is unsure if she wishes to gain ramp through agency due to the confusion. She states that she will follow up with agency but may go with another agency to assist her with gaining a ramp. CSW spent time reviewing community resources. Patient denies needing any further social work assistance and is agreeable to [place] work discharge.  Plan-CSW will update [facility] RNCM and PCP of discharge.  [name], BSW, MSW, [email or web address] Phone: [phone] Fax: [phone]

## 2016-04-18 DIAGNOSIS — D631 Anemia in chronic kidney disease: Secondary | ICD-10-CM | POA: Diagnosis not present

## 2016-04-18 DIAGNOSIS — N186 End stage renal disease: Secondary | ICD-10-CM | POA: Diagnosis not present

## 2016-04-18 DIAGNOSIS — N2581 Secondary hyperparathyroidism of renal origin: Secondary | ICD-10-CM | POA: Diagnosis not present

## 2016-04-18 DIAGNOSIS — D509 Iron deficiency anemia, unspecified: Secondary | ICD-10-CM | POA: Diagnosis not present

## 2016-04-20 ENCOUNTER — Ambulatory Visit: Payer: Medicare Other | Admitting: Internal Medicine

## 2016-04-20 ENCOUNTER — Other Ambulatory Visit: Payer: Self-pay

## 2016-04-20 DIAGNOSIS — D631 Anemia in chronic kidney disease: Secondary | ICD-10-CM | POA: Diagnosis not present

## 2016-04-20 DIAGNOSIS — N186 End stage renal disease: Secondary | ICD-10-CM | POA: Diagnosis not present

## 2016-04-20 DIAGNOSIS — N2581 Secondary hyperparathyroidism of renal origin: Secondary | ICD-10-CM | POA: Diagnosis not present

## 2016-04-20 DIAGNOSIS — D509 Iron deficiency anemia, unspecified: Secondary | ICD-10-CM | POA: Diagnosis not present

## 2016-04-20 NOTE — Progress Notes (Deleted)
   [facility] Kerrin Mo, MD Phone: [phone]  Reason For Visit:   # *** -   Past Medical History Reviewed problem list.  Medications- reviewed and updated No additions to family history Social history- patient is a *** smoker  Objective: There were no vitals taken for this visit. Gen: NAD, alert, cooperative with exam HEENT: Normal    Neck: No masses palpated. No lymphadenopathy    Ears: Tympanic membranes intact, normal light reflex, no erythema, no bulging    Eyes: PERRLA, EOMI    Nose: nasal turbinates moist    Throat: moist mucus membranes, no erythema Cardio: regular rate and rhythm, S1S2 heard, no murmurs appreciated Pulm: clear to auscultation bilaterally, no wheezes, rhonchi or rales GI: soft, non-tender, non-distended, bowel sounds present, no hepatomegaly, no splenomegaly GU: external vaginal tissue ***, cervix ***, *** punctate lesions on cervix appreciated, *** discharge from cervical os, *** bleeding, *** cervical motion tenderness, *** abdominal/ adnexal masses Extremities: warm, well perfused, No edema, cyanosis or clubbing;  MSK: Normal gait and station Skin: dry, intact, no rashes or lesions Neuro: Strength and sensation grossly intact   Assessment/Plan: See problem based a/p

## 2016-04-23 DIAGNOSIS — N2581 Secondary hyperparathyroidism of renal origin: Secondary | ICD-10-CM | POA: Diagnosis not present

## 2016-04-23 DIAGNOSIS — D509 Iron deficiency anemia, unspecified: Secondary | ICD-10-CM | POA: Diagnosis not present

## 2016-04-23 DIAGNOSIS — N186 End stage renal disease: Secondary | ICD-10-CM | POA: Diagnosis not present

## 2016-04-23 DIAGNOSIS — D631 Anemia in chronic kidney disease: Secondary | ICD-10-CM | POA: Diagnosis not present

## 2016-04-23 NOTE — Patient Outreach (Addendum)
    This RNCM made telephone contact with patient who had called to request information on her ramp. This RNCM provided patient with contact information for [employer] for further informaiton regarding the ramp.  Plan: This RNCM and patient agreed to home visit within the next 21 days.

## 2016-04-25 ENCOUNTER — Telehealth: Payer: Self-pay | Admitting: *Deleted

## 2016-04-25 DIAGNOSIS — D631 Anemia in chronic kidney disease: Secondary | ICD-10-CM | POA: Diagnosis not present

## 2016-04-25 DIAGNOSIS — D509 Iron deficiency anemia, unspecified: Secondary | ICD-10-CM | POA: Diagnosis not present

## 2016-04-25 DIAGNOSIS — N039 Chronic nephritic syndrome with unspecified morphologic changes: Secondary | ICD-10-CM | POA: Diagnosis not present

## 2016-04-25 DIAGNOSIS — N2581 Secondary hyperparathyroidism of renal origin: Secondary | ICD-10-CM | POA: Diagnosis not present

## 2016-04-25 DIAGNOSIS — Z992 Dependence on renal dialysis: Secondary | ICD-10-CM | POA: Diagnosis not present

## 2016-04-25 DIAGNOSIS — N186 End stage renal disease: Secondary | ICD-10-CM | POA: Diagnosis not present

## 2016-04-25 NOTE — Telephone Encounter (Signed)
Received request from PCP to reschedule OV as patient no showed on 04/20/2016. Patient receives dialysis on M [address]. OV scheduled for Thursday, 05/03/2016 at 4 PM. Hubbard Hartshorn, RN, BSN

## 2016-04-26 ENCOUNTER — Telehealth: Payer: Self-pay | Admitting: Internal Medicine

## 2016-04-26 ENCOUNTER — Other Ambulatory Visit: Payer: Self-pay

## 2016-04-26 NOTE — Patient Outreach (Signed)
[facility]) Care Management  04/26/2016  [name] Marks Jun 16, 1955 [id number]   Telephone contact made to patient as requested before home visits. Patient advises this RNCM she does not feel like a home visit today. Patient requested call later in the month to reschedule home visit  Plan: Telephone call in the next 21 days to reschedule home visit.

## 2016-04-27 DIAGNOSIS — N2581 Secondary hyperparathyroidism of renal origin: Secondary | ICD-10-CM | POA: Diagnosis not present

## 2016-04-27 DIAGNOSIS — D631 Anemia in chronic kidney disease: Secondary | ICD-10-CM | POA: Diagnosis not present

## 2016-04-27 DIAGNOSIS — D509 Iron deficiency anemia, unspecified: Secondary | ICD-10-CM | POA: Diagnosis not present

## 2016-04-27 DIAGNOSIS — N186 End stage renal disease: Secondary | ICD-10-CM | POA: Diagnosis not present

## 2016-04-30 DIAGNOSIS — N2581 Secondary hyperparathyroidism of renal origin: Secondary | ICD-10-CM | POA: Diagnosis not present

## 2016-04-30 DIAGNOSIS — D509 Iron deficiency anemia, unspecified: Secondary | ICD-10-CM | POA: Diagnosis not present

## 2016-04-30 DIAGNOSIS — N186 End stage renal disease: Secondary | ICD-10-CM | POA: Diagnosis not present

## 2016-04-30 DIAGNOSIS — D631 Anemia in chronic kidney disease: Secondary | ICD-10-CM | POA: Diagnosis not present

## 2016-05-01 ENCOUNTER — Encounter (INDEPENDENT_AMBULATORY_CARE_PROVIDER_SITE_OTHER): Payer: Medicare Other

## 2016-05-01 ENCOUNTER — Ambulatory Visit (INDEPENDENT_AMBULATORY_CARE_PROVIDER_SITE_OTHER): Payer: Medicare Other | Admitting: Vascular Surgery

## 2016-05-02 DIAGNOSIS — N2581 Secondary hyperparathyroidism of renal origin: Secondary | ICD-10-CM | POA: Diagnosis not present

## 2016-05-02 DIAGNOSIS — N186 End stage renal disease: Secondary | ICD-10-CM | POA: Diagnosis not present

## 2016-05-02 DIAGNOSIS — D509 Iron deficiency anemia, unspecified: Secondary | ICD-10-CM | POA: Diagnosis not present

## 2016-05-02 DIAGNOSIS — D631 Anemia in chronic kidney disease: Secondary | ICD-10-CM | POA: Diagnosis not present

## 2016-05-03 ENCOUNTER — Encounter: Payer: Medicare Other | Admitting: Family

## 2016-05-03 ENCOUNTER — Telehealth: Payer: Self-pay | Admitting: *Deleted

## 2016-05-03 ENCOUNTER — Telehealth: Payer: Self-pay | Admitting: Vascular Surgery

## 2016-05-03 ENCOUNTER — Ambulatory Visit (INDEPENDENT_AMBULATORY_CARE_PROVIDER_SITE_OTHER): Payer: Medicare Other | Admitting: Internal Medicine

## 2016-05-03 ENCOUNTER — Encounter: Payer: Self-pay | Admitting: Internal Medicine

## 2016-05-03 DIAGNOSIS — N186 End stage renal disease: Secondary | ICD-10-CM

## 2016-05-03 DIAGNOSIS — Z953 Presence of xenogenic heart valve: Secondary | ICD-10-CM

## 2016-05-03 DIAGNOSIS — E669 Obesity, unspecified: Secondary | ICD-10-CM | POA: Diagnosis not present

## 2016-05-03 DIAGNOSIS — R7301 Impaired fasting glucose: Secondary | ICD-10-CM | POA: Diagnosis not present

## 2016-05-03 DIAGNOSIS — M25511 Pain in right shoulder: Secondary | ICD-10-CM | POA: Diagnosis not present

## 2016-05-03 DIAGNOSIS — Z992 Dependence on renal dialysis: Secondary | ICD-10-CM

## 2016-05-03 DIAGNOSIS — Z0000 Encounter for general adult medical examination without abnormal findings: Secondary | ICD-10-CM | POA: Diagnosis not present

## 2016-05-03 NOTE — Telephone Encounter (Signed)
The patient came in today for a suture removal that was supposed to be performed back in January 2018. The patient has no showed several times and still has the suture embedded. The patient is also scheduled on 3/21 for a new access appointment with Dr. Scot Dock. The patient couldn't stay today to have her sutures removed because she had to make her next appointment but she will be back 3/21 to have her new access appointment with Dr. Scot Dock and evaluate any sutures left in her arm. She states " there is only one stitch" .

## 2016-05-03 NOTE — Telephone Encounter (Signed)
The physical therapist called today to relate finding [name] Marks on the floor by her bed when she arrived at her home this morning. She had been there for two hours according to [name] Marks. She slipped off the bed with no injuries noted. The physical therapist is concerned and feels that she shouldn't be living alone, but she doesn't want to leave her house.

## 2016-05-03 NOTE — Progress Notes (Signed)
   [facility] Kerrin Mo, MD Phone: [phone]  Reason For Visit: Hospital F/U  # Patient with ESRD present following hospital and nursing home stay. 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 Patient was in the hospital for several followed by 1 month of rehabilitation. After leaving the nursing home patient went home. She missed two of her appointments - ID and cardiology appointment. She indicates that this was due to transport. She currently lives on her own. She has home health. She indicates completing her 6 week course of antibiotics in the nursing home. Denies any fevers or chills. Denies any swelling in her legs. No issues with getting  to and from dialysis. Patient indicates her strength is improving daily. Patient was not sure when her next appointments were, however indicated she has transport for these.   Past Medical History Reviewed problem list.  Medications- reviewed and updated No additions to family history Social history- patient is a non smoker  Objective: BP 104/62   Pulse 74   Temp 97.5 F (36.4 C) (Oral)   Ht 5\' 1"  (1.549 m)   Wt 176 lb 6.4 oz (80 kg)   SpO2 96%   BMI 33.33 kg/m  Gen: Elderly lady, in wheelchair,NAD, alert, cooperative with exam Cardio: regular rate and rhythm, S1S2 heard, no murmurs appreciated Pulm: clear to auscultation bilaterally, no wheezes, rhonchi or rales Extremities: warm, well perfused, No edema,    Assessment/Plan: See problem based a/p  ESRD (end stage renal disease) on dialysis ([place]) MWF dialysis. Currently with a perm cath. 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  - Follow up with ID and cardiothoracic did not occur as patient did not have transportation  - Discussed with social- patient given information for transportation  - Provided with next several weeks of appointments, discussed the importance of follow up  - Patient was suppose to receive 6 weeks of Ancef and Rifampin - indicates she finished in the nursing - called nursing home to determine if end date was appropriate - however they denied having access to these old records  - Patient denies any fevers or chills, indicates fatigue is improving - therefore likely infection was treated - also 1 month out from last dose  - Denies any leg swelling, SOB, no crackles on exam, therefore no concern for worsening aortic insuffiencey, valve likely working well  - No anticoagulation noted on discharge summary, no need anticoagulate - Follow up in  1 moth  - Discussed with Dr. Andria Frames

## 2016-05-03 NOTE — Patient Instructions (Signed)
Please follow up in 3 months or sooner as needed. If have any issues getting to your appointments please let me know

## 2016-05-04 ENCOUNTER — Encounter: Payer: Self-pay | Admitting: Vascular Surgery

## 2016-05-04 DIAGNOSIS — D509 Iron deficiency anemia, unspecified: Secondary | ICD-10-CM | POA: Diagnosis not present

## 2016-05-04 DIAGNOSIS — D631 Anemia in chronic kidney disease: Secondary | ICD-10-CM | POA: Diagnosis not present

## 2016-05-04 DIAGNOSIS — N186 End stage renal disease: Secondary | ICD-10-CM | POA: Diagnosis not present

## 2016-05-04 DIAGNOSIS — N2581 Secondary hyperparathyroidism of renal origin: Secondary | ICD-10-CM | POA: Diagnosis not present

## 2016-05-07 ENCOUNTER — Other Ambulatory Visit: Payer: Self-pay | Admitting: Thoracic Surgery (Cardiothoracic Vascular Surgery)

## 2016-05-07 DIAGNOSIS — N186 End stage renal disease: Secondary | ICD-10-CM | POA: Diagnosis not present

## 2016-05-07 DIAGNOSIS — D509 Iron deficiency anemia, unspecified: Secondary | ICD-10-CM | POA: Diagnosis not present

## 2016-05-07 DIAGNOSIS — Z952 Presence of prosthetic heart valve: Secondary | ICD-10-CM

## 2016-05-07 DIAGNOSIS — D631 Anemia in chronic kidney disease: Secondary | ICD-10-CM | POA: Diagnosis not present

## 2016-05-07 DIAGNOSIS — N2581 Secondary hyperparathyroidism of renal origin: Secondary | ICD-10-CM | POA: Diagnosis not present

## 2016-05-08 ENCOUNTER — Ambulatory Visit: Payer: Medicare Other | Admitting: Thoracic Surgery (Cardiothoracic Vascular Surgery)

## 2016-05-08 DIAGNOSIS — Z953 Presence of xenogenic heart valve: Secondary | ICD-10-CM | POA: Insufficient documentation

## 2016-05-08 NOTE — Assessment & Plan Note (Signed)
MWF dialysis. Currently with a perm cath. Patient to get vein mapping for another graft- previous graft was infected and was removed - this was cause of patient's bacteremia and subsequent endocarditis

## 2016-05-08 NOTE — Assessment & Plan Note (Signed)
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  - Follow up with ID and cardiothoracic did not occur as patient did not have transportation  - Discussed with social- patient given information for transportation  - Provided with next several weeks of appointments, discussed the importance of follow up  - Patient was suppose to receive 6 weeks of Ancef and Rifampin - indicates she finished in the nursing - called nursing home to determine if end date was appropriate - however they denied having access to these old records  - Patient denies any fevers or chills, indicates fatigue is improving - therefore likely infection was treated - also 1 month out from last dose  - Denies any leg swelling, SOB, no crackles on exam, therefore no concern for worsening aortic insuffiencey, valve likely working well  - No anticoagulation noted on discharge summary, no need anticoagulate - Follow up in  1 moth  - Discussed with Dr. Andria Frames

## 2016-05-09 ENCOUNTER — Encounter: Payer: Self-pay | Admitting: Cardiology

## 2016-05-09 DIAGNOSIS — D631 Anemia in chronic kidney disease: Secondary | ICD-10-CM | POA: Diagnosis not present

## 2016-05-09 DIAGNOSIS — N2581 Secondary hyperparathyroidism of renal origin: Secondary | ICD-10-CM | POA: Diagnosis not present

## 2016-05-09 DIAGNOSIS — N186 End stage renal disease: Secondary | ICD-10-CM | POA: Diagnosis not present

## 2016-05-09 DIAGNOSIS — D509 Iron deficiency anemia, unspecified: Secondary | ICD-10-CM | POA: Diagnosis not present

## 2016-05-11 DIAGNOSIS — D509 Iron deficiency anemia, unspecified: Secondary | ICD-10-CM | POA: Diagnosis not present

## 2016-05-11 DIAGNOSIS — N2581 Secondary hyperparathyroidism of renal origin: Secondary | ICD-10-CM | POA: Diagnosis not present

## 2016-05-11 DIAGNOSIS — N186 End stage renal disease: Secondary | ICD-10-CM | POA: Diagnosis not present

## 2016-05-11 DIAGNOSIS — D631 Anemia in chronic kidney disease: Secondary | ICD-10-CM | POA: Diagnosis not present

## 2016-05-14 DIAGNOSIS — D509 Iron deficiency anemia, unspecified: Secondary | ICD-10-CM | POA: Diagnosis not present

## 2016-05-14 DIAGNOSIS — N2581 Secondary hyperparathyroidism of renal origin: Secondary | ICD-10-CM | POA: Diagnosis not present

## 2016-05-14 DIAGNOSIS — N186 End stage renal disease: Secondary | ICD-10-CM | POA: Diagnosis not present

## 2016-05-14 DIAGNOSIS — D631 Anemia in chronic kidney disease: Secondary | ICD-10-CM | POA: Diagnosis not present

## 2016-05-15 ENCOUNTER — Ambulatory Visit
Admission: RE | Admit: 2016-05-15 | Discharge: 2016-05-15 | Disposition: A | Discharge status: Home / Self Care | Payer: Medicare Other | Source: Ambulatory Visit | Attending: Thoracic Surgery (Cardiothoracic Vascular Surgery) | Admitting: Thoracic Surgery (Cardiothoracic Vascular Surgery)

## 2016-05-15 ENCOUNTER — Ambulatory Visit (INDEPENDENT_AMBULATORY_CARE_PROVIDER_SITE_OTHER): Payer: Self-pay | Admitting: Thoracic Surgery (Cardiothoracic Vascular Surgery)

## 2016-05-15 VITALS — BP 90/60 | HR 72 | Resp 16 | Ht 61.0 in | Wt 176.0 lb

## 2016-05-15 DIAGNOSIS — I351 Nonrheumatic aortic (valve) insufficiency: Secondary | ICD-10-CM

## 2016-05-15 DIAGNOSIS — Z952 Presence of prosthetic heart valve: Secondary | ICD-10-CM

## 2016-05-15 DIAGNOSIS — R0602 Shortness of breath: Secondary | ICD-10-CM | POA: Diagnosis not present

## 2016-05-15 NOTE — Progress Notes (Signed)
[address]       [place] [address]             [phone]    HPI: [name] Marks returns today for a postoperative follow up visit  She is a 61 year old woman with end-stage renal failure on hemodialysis. 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 She underwent aortic valve replacement with a bovine pericardial valve on 02/25/2016. Postoperatively she had a labile blood pressure and required admitted drain. She was scheduled to receive 6 weeks of Ancef and rifampin on dialysis. She was discharged to a skilled nursing facility.  She missed on multiple previous appointments but was able to come today. She also has missed appointments with cardiology. She says that she hasn't been able to walk. She says that the nursing staff of encouraged her not to walk. She is not having much incisional pain. She says she did receive her IV antibiotics. She sees vascular surgery tomorrow to assess for permanent access, currently she is still being dialyzed through a PermCath.  Past Medical History:  Diagnosis Date  . Complication of anesthesia    due to kidney disease  . End stage renal disease on dialysis ([place])   . GERD (gastroesophageal reflux disease)   . Gout     Current Outpatient Prescriptions  Medication Sig Dispense Refill  . aspirin EC 325 MG EC tablet Take 1 tablet (325 mg total) by mouth daily. 30 tablet 0  . cinacalcet (SENSIPAR) 60 MG tablet Take 60 mg by mouth daily.    . famotidine (PEPCID) 20 MG tablet Take 20 mg by mouth daily.     . Nutritional Supplements (NEPRO) LIQD Take 237 mLs by mouth daily.    . sevelamer (RENVELA) 800 MG tablet Take 800 mg by mouth 3 (three) times daily with meals.      No current facility-administered medications for this visit.     Physical Exam BP 90/60 (BP Location: Left Arm, Patient Position: Sitting, Cuff Size: Large)   Pulse 72   Resp 16   Ht 5\' 1"  (1.549 m)   Wt 176 lb (79.8 kg)   SpO2 95% Comment: ON RA   BMI 33.25 kg/m  Obese 60 year old woman in no acute distress Alert and oriented 3 with no focal motor deficits Cardiac regular rate and rhythm normal S1 and S2 no rubs or murmurs Lungs clear with equal breath sound bilaterally Sternum stable, incision well-healed  Diagnostic Tests: CHEST  2 VIEW  COMPARISON:  03/02/2016  FINDINGS: Lungs are clear.  No pleural effusion or pneumothorax.  Heart is normal in size.  Prosthetic aortic valve.  Left dual lumen catheter terminates in the upper right atrium. Bilateral axillary stents.  Median sternotomy.  IMPRESSION: No evidence of acute cardiopulmonary disease.  Postsurgical/postprocedural changes, as above.   Electronically Signed   By: Julian Hy M.D.   On: 05/15/2016 16:18 I personally reviewed the chest x-ray and concur with the findings noted above.  Impression: [name] Marks is a 61 year old woman with end-stage renal disease on hemodialysis who had aortic valve endocarditis back in December. She underwent aortic valve replacement with a pericardial valve. She did not require anticoagulation with Coumadin. She is on an aspirin a day.  She is now 3 months out from surgery. There are no restrictions on her activities from a surgical standpoint. It is difficult to tell [name] Marks whether she has been participating in physical therapy or not and her upper level. She  certainly should be ambulatory at this point and not in need of a wheelchair. I emphasized to her the importance of her starting to push herself to get stronger and more active.  She needs a follow-up with cardiology and infectious disease.  Plan:  I will be happy to see [name] Marks back any time in the future if I can be of any further assistance with her care.  Melrose Nakayama, MD [facility] [phone]

## 2016-05-16 ENCOUNTER — Observation Stay (HOSPITAL_COMMUNITY)
Admission: EM | Admit: 2016-05-16 | Discharge: 2016-05-17 | Disposition: A | Discharge status: Home / Self Care | Payer: Medicare Other | Attending: Family Medicine | Admitting: Family Medicine

## 2016-05-16 ENCOUNTER — Encounter (HOSPITAL_COMMUNITY): Payer: Medicare Other

## 2016-05-16 ENCOUNTER — Emergency Department (HOSPITAL_COMMUNITY): Payer: Medicare Other

## 2016-05-16 ENCOUNTER — Other Ambulatory Visit (HOSPITAL_COMMUNITY): Payer: Medicare Other

## 2016-05-16 ENCOUNTER — Encounter (HOSPITAL_COMMUNITY): Payer: Self-pay | Admitting: Emergency Medicine

## 2016-05-16 ENCOUNTER — Encounter: Payer: Medicare Other | Admitting: Vascular Surgery

## 2016-05-16 DIAGNOSIS — K219 Gastro-esophageal reflux disease without esophagitis: Secondary | ICD-10-CM | POA: Insufficient documentation

## 2016-05-16 DIAGNOSIS — E669 Obesity, unspecified: Secondary | ICD-10-CM | POA: Diagnosis not present

## 2016-05-16 DIAGNOSIS — I679 Cerebrovascular disease, unspecified: Secondary | ICD-10-CM

## 2016-05-16 DIAGNOSIS — R2689 Other abnormalities of gait and mobility: Secondary | ICD-10-CM | POA: Diagnosis not present

## 2016-05-16 DIAGNOSIS — Z8673 Personal history of transient ischemic attack (TIA), and cerebral infarction without residual deficits: Secondary | ICD-10-CM | POA: Diagnosis not present

## 2016-05-16 DIAGNOSIS — Z7982 Long term (current) use of aspirin: Secondary | ICD-10-CM | POA: Diagnosis not present

## 2016-05-16 DIAGNOSIS — R531 Weakness: Secondary | ICD-10-CM | POA: Diagnosis not present

## 2016-05-16 DIAGNOSIS — I9589 Other hypotension: Secondary | ICD-10-CM | POA: Insufficient documentation

## 2016-05-16 DIAGNOSIS — R4701 Aphasia: Secondary | ICD-10-CM | POA: Diagnosis not present

## 2016-05-16 DIAGNOSIS — Z992 Dependence on renal dialysis: Secondary | ICD-10-CM | POA: Insufficient documentation

## 2016-05-16 DIAGNOSIS — N186 End stage renal disease: Secondary | ICD-10-CM | POA: Insufficient documentation

## 2016-05-16 DIAGNOSIS — I639 Cerebral infarction, unspecified: Secondary | ICD-10-CM

## 2016-05-16 DIAGNOSIS — R4702 Dysphasia: Secondary | ICD-10-CM | POA: Diagnosis not present

## 2016-05-16 DIAGNOSIS — Z953 Presence of xenogenic heart valve: Secondary | ICD-10-CM | POA: Insufficient documentation

## 2016-05-16 DIAGNOSIS — R200 Anesthesia of skin: Secondary | ICD-10-CM | POA: Diagnosis not present

## 2016-05-16 DIAGNOSIS — Z6835 Body mass index (BMI) 35.0-35.9, adult: Secondary | ICD-10-CM | POA: Insufficient documentation

## 2016-05-16 DIAGNOSIS — E875 Hyperkalemia: Secondary | ICD-10-CM | POA: Diagnosis not present

## 2016-05-16 DIAGNOSIS — Z79899 Other long term (current) drug therapy: Secondary | ICD-10-CM | POA: Diagnosis not present

## 2016-05-16 DIAGNOSIS — Z9181 History of falling: Secondary | ICD-10-CM | POA: Diagnosis not present

## 2016-05-16 DIAGNOSIS — Z66 Do not resuscitate: Secondary | ICD-10-CM | POA: Insufficient documentation

## 2016-05-16 DIAGNOSIS — G459 Transient cerebral ischemic attack, unspecified: Principal | ICD-10-CM | POA: Insufficient documentation

## 2016-05-16 DIAGNOSIS — N2581 Secondary hyperparathyroidism of renal origin: Secondary | ICD-10-CM | POA: Diagnosis not present

## 2016-05-16 DIAGNOSIS — D631 Anemia in chronic kidney disease: Secondary | ICD-10-CM | POA: Diagnosis not present

## 2016-05-16 DIAGNOSIS — D509 Iron deficiency anemia, unspecified: Secondary | ICD-10-CM | POA: Diagnosis not present

## 2016-05-16 HISTORY — DX: Cerebral infarction, unspecified: I63.9

## 2016-05-16 LAB — CBC
HEMATOCRIT: 36.1 % (ref 36.0–46.0)
Hemoglobin: 11.5 g/dL — ABNORMAL LOW (ref 12.0–15.0)
MCH: 28.3 pg (ref 26.0–34.0)
MCHC: 31.9 g/dL (ref 30.0–36.0)
MCV: 88.7 fL (ref 78.0–100.0)
Platelets: 180 10*3/uL (ref 150–400)
RBC: 4.07 MIL/uL (ref 3.87–5.11)
RDW: 16.9 % — AB (ref 11.5–15.5)
WBC: 5.5 10*3/uL (ref 4.0–10.5)

## 2016-05-16 LAB — I-STAT CHEM 8, ED
CALCIUM ION: 1.04 mmol/L — AB (ref 1.15–1.40)
Chloride: 99 mmol/L — ABNORMAL LOW (ref 101–111)
Creatinine, Ser: 1.6 mg/dL — ABNORMAL HIGH (ref 0.44–1.00)
Glucose, Bld: 78 mg/dL (ref 65–99)
HCT: 40 % (ref 36.0–46.0)
Hemoglobin: 13.6 g/dL (ref 12.0–15.0)
Potassium: 4.2 mmol/L (ref 3.5–5.1)
SODIUM: 137 mmol/L (ref 135–145)
TCO2: 30 mmol/L (ref 0–100)

## 2016-05-16 LAB — DIFFERENTIAL
BASOS ABS: 0 10*3/uL (ref 0.0–0.1)
BASOS PCT: 0 %
EOS ABS: 0.1 10*3/uL (ref 0.0–0.7)
EOS PCT: 3 %
Lymphocytes Relative: 36 %
Lymphs Abs: 2 10*3/uL (ref 0.7–4.0)
MONO ABS: 0.4 10*3/uL (ref 0.1–1.0)
MONOS PCT: 7 %
Neutro Abs: 3 10*3/uL (ref 1.7–7.7)
Neutrophils Relative %: 54 %

## 2016-05-16 LAB — COMPREHENSIVE METABOLIC PANEL
ALT: 13 U/L — ABNORMAL LOW (ref 14–54)
ANION GAP: 9 (ref 5–15)
AST: 26 U/L (ref 15–41)
Albumin: 2.6 g/dL — ABNORMAL LOW (ref 3.5–5.0)
Alkaline Phosphatase: 176 U/L — ABNORMAL HIGH (ref 38–126)
BILIRUBIN TOTAL: 0.8 mg/dL (ref 0.3–1.2)
BUN: <5 mg/dL — ABNORMAL LOW (ref 6–20)
CALCIUM: 8.7 mg/dL — AB (ref 8.9–10.3)
CHLORIDE: 99 mmol/L — AB (ref 101–111)
CO2: 29 mmol/L (ref 22–32)
Creatinine, Ser: 1.66 mg/dL — ABNORMAL HIGH (ref 0.44–1.00)
GFR, EST AFRICAN AMERICAN: 37 mL/min — AB (ref >60)
GFR, EST NON AFRICAN AMERICAN: 32 mL/min — AB (ref >60)
Glucose, Bld: 79 mg/dL (ref 65–99)
POTASSIUM: 4.3 mmol/L (ref 3.5–5.1)
Sodium: 137 mmol/L (ref 135–145)
Total Protein: 6.3 g/dL — ABNORMAL LOW (ref 6.5–8.1)

## 2016-05-16 LAB — I-STAT TROPONIN, ED: TROPONIN I, POC: 0 ng/mL (ref 0.00–0.08)

## 2016-05-16 LAB — ETHANOL

## 2016-05-16 LAB — PROTIME-INR
INR: 1.06
Prothrombin Time: 13.8 seconds (ref 11.4–15.2)

## 2016-05-16 LAB — APTT: APTT: 56 s — AB (ref 24–36)

## 2016-05-16 MED ORDER — STROKE: EARLY STAGES OF RECOVERY BOOK
Freq: Once | Status: AC
  Administered 2016-05-16: 22:00:00

## 2016-05-16 MED ORDER — ASPIRIN EC 325 MG PO TBEC
325.0000 mg | DELAYED_RELEASE_TABLET | Freq: Every day | ORAL | Status: DC
  Administered 2016-05-16 – 2016-05-17 (×2): 325 mg via ORAL
  Filled 2016-05-16 (×2): qty 1

## 2016-05-16 MED ORDER — SODIUM CHLORIDE 0.9 % IV BOLUS (SEPSIS)
500.0000 mL | Freq: Once | INTRAVENOUS | Status: AC
  Administered 2016-05-16: 500 mL via INTRAVENOUS

## 2016-05-16 MED ORDER — SEVELAMER CARBONATE 800 MG PO TABS
800.0000 mg | ORAL_TABLET | Freq: Three times a day (TID) | ORAL | Status: DC
  Administered 2016-05-17 (×3): 800 mg via ORAL
  Filled 2016-05-16 (×3): qty 1

## 2016-05-16 MED ORDER — NEPRO/CARBSTEADY PO LIQD
237.0000 mL | Freq: Every day | ORAL | Status: DC
  Filled 2016-05-16 (×2): qty 237

## 2016-05-16 MED ORDER — BUTALBITAL-APAP-CAFFEINE 50-325-40 MG PO TABS
1.0000 | ORAL_TABLET | Freq: Four times a day (QID) | ORAL | Status: DC | PRN
  Administered 2016-05-16: 1 via ORAL
  Filled 2016-05-16: qty 1

## 2016-05-16 MED ORDER — FAMOTIDINE 20 MG PO TABS
20.0000 mg | ORAL_TABLET | Freq: Every day | ORAL | Status: DC
  Administered 2016-05-16 – 2016-05-17 (×2): 20 mg via ORAL
  Filled 2016-05-16 (×2): qty 1

## 2016-05-16 MED ORDER — CINACALCET HCL 30 MG PO TABS
60.0000 mg | ORAL_TABLET | Freq: Every day | ORAL | Status: DC
Start: 2016-05-16 — End: 2016-05-17
  Administered 2016-05-17: 60 mg via ORAL
  Filled 2016-05-16 (×2): qty 2

## 2016-05-16 MED ORDER — SENNOSIDES-DOCUSATE SODIUM 8.6-50 MG PO TABS: 1.0000 | ORAL_TABLET | Freq: Every evening | ORAL | Status: DC | PRN

## 2016-05-16 MED ORDER — ACETAMINOPHEN 325 MG PO TABS: 650.0000 mg | ORAL_TABLET | Freq: Four times a day (QID) | ORAL | Status: DC | PRN

## 2016-05-16 NOTE — ED Provider Notes (Signed)
[facility] DEPT Provider Note   CSN: [id number] Arrival date & time: 05/16/16  1137     History   Chief Complaint Chief Complaint  Patient presents with  . Stroke Symptoms    HPI [name] Marks is a 61 y.o. female.  HPI Patient presents with debility speaking. Was riding the car with her son. She states she was arguing with them. She then lost the ability to speak. States that her throat felt tight. Has improved now. States that the words written her head that just would not come out. States her throat also felt a little tight with that but was not drooling. Also had some tingling in her left hand. States it kind of felt like when she sleeps on the hand. Feels better now. Able speak clearly. Still slight tingling in the hand. She states the left hand had drawn up and is now lucid again. She is a dialysis patient was dialyzed earlier today. No history of stroke.    Past Medical History:  Diagnosis Date  . Complication of anesthesia    due to kidney disease  . End stage renal disease on dialysis ([place])   . GERD (gastroesophageal reflux disease)   . Gout     Patient Active Problem List   Diagnosis Date Noted  . CVA (cerebral vascular accident) ([place]) 05/16/2016  . History of aortic valve replacement with bioprosthetic valve 05/08/2016  . S/P AVR 02/25/2016  . Bacteremia due to coagulase-negative Staphylococcus   . S/P dialysis catheter insertion ([place])   . Bilateral low back pain with sciatica   . Neck pain   . Staphylococcus aureus bacteremia   . Anemia   . Septic shock ([place])   . Renal dialysis device, implant, or graft complication 29/92/4268  . Pain and swelling of right upper extremity 12/20/2015  . Complex endometrial hyperplasia with atypia 11/08/2015  . Increased endometrial stripe thickness 11/03/2015  . Obesity 10/28/2014  . Shoulder pain, bilateral 10/28/2014  . Health care maintenance 10/28/2014  . Right carotid bruit 01/08/2013  . Generalized headaches  01/08/2013  . Chronic female pelvic pain 08/08/2012  . Gout, unspecified 12/06/2008  . Esophageal reflux 12/06/2008  . BACK PAIN 12/06/2008  . FIBROIDS, UTERUS 11/24/2008  . ESRD (end stage renal disease) on dialysis ([place]) 07/01/2006    Past Surgical History:  Procedure Laterality Date  . AORTIC VALVE REPLACEMENT N/A 02/25/2016   Procedure: AORTIC VALVE REPLACEMENT (AVR) implanted with Magna Ease Aortic valve size 28mm;  Surgeon: Melrose Nakayama, MD;  Location: [facility];  Service: Open Heart Surgery;  Laterality: N/A;  . [place] Right 02/17/2016   Procedure: REMOVAL OF TWO ARTERIOVENOUS GORETEX GRAFTS ([place]);  Surgeon: Angelia Mould, MD;  Location: [facility];  Service: Vascular;  Laterality: Right;  . DG AV DIALYSIS GRAFT DECLOT OR    . DILATATION & CURETTAGE/HYSTEROSCOPY WITH TRUECLEAR N/A 11/06/2012   Procedure: DILATATION & CURETTAGE/HYSTEROSCOPY WITH TRUECLEAR;  Surgeon: Terrance Mass, MD;  Location: [facility];  Service: Gynecology;  Laterality: N/A;  Truclear Resectoscopic Polypectomy   . INSERTION OF DIALYSIS CATHETER N/A 02/19/2016   Procedure: INSERTION OF Left Internal Jugular DIALYSIS CATHETER;  Surgeon: Angelia Mould, MD;  Location: [facility];  Service: Vascular;  Laterality: N/A;  . PATCH ANGIOPLASTY Right 02/17/2016   Procedure: PATCH ANGIOPLASTY;  Surgeon: Angelia Mould, MD;  Location: [facility];  Service: Vascular;  Laterality: Right;  . PERIPHERAL VASCULAR CATHETERIZATION N/A 09/14/2014   Procedure: A/V Shuntogram/Fistulagram;  Surgeon: Dolores Lory  Schnier, MD;  Location: [facility];  Service: Cardiovascular;  Laterality: N/A;  . PERIPHERAL VASCULAR CATHETERIZATION N/A 09/14/2014   Procedure: A/V Shunt Intervention;  Surgeon: Katha Cabal, MD;  Location: [facility];  Service: Cardiovascular;  Laterality: N/A;  . PERIPHERAL VASCULAR CATHETERIZATION Right 12/09/2014   Procedure: A/V Shuntogram/Fistulagram;  Surgeon: Algernon Huxley, MD;   Location: [facility];  Service: Cardiovascular;  Laterality: Right;  . PERIPHERAL VASCULAR CATHETERIZATION N/A 12/09/2014   Procedure: A/V Shunt Intervention;  Surgeon: Algernon Huxley, MD;  Location: [facility];  Service: Cardiovascular;  Laterality: N/A;  . PERIPHERAL VASCULAR CATHETERIZATION Right 05/24/2015   Procedure: A/V Shuntogram;  Surgeon: Serafina Mitchell, MD;  Location: [facility];  Service: Cardiovascular;  Laterality: Right;  . PERIPHERAL VASCULAR CATHETERIZATION Right 05/24/2015   Procedure: Peripheral Vascular Balloon Angioplasty;  Surgeon: Serafina Mitchell, MD;  Location: [facility];  Service: Cardiovascular;  Laterality: Right;  right arm shunt  . PERIPHERAL VASCULAR CATHETERIZATION N/A 06/13/2015   Procedure: A/V Shuntogram/Fistulagram;  Surgeon: Algernon Huxley, MD;  Location: [facility];  Service: Cardiovascular;  Laterality: N/A;  . PERIPHERAL VASCULAR CATHETERIZATION N/A 06/13/2015   Procedure: A/V Shunt Intervention;  Surgeon: Algernon Huxley, MD;  Location: [facility];  Service: Cardiovascular;  Laterality: N/A;  . TEE WITHOUT CARDIOVERSION N/A 02/22/2016   Procedure: TRANSESOPHAGEAL ECHOCARDIOGRAM (TEE);  Surgeon: Pixie Casino, MD;  Location: [facility];  Service: Cardiovascular;  Laterality: N/A;  . TEE WITHOUT CARDIOVERSION N/A 02/25/2016   Procedure: TRANSESOPHAGEAL ECHOCARDIOGRAM (TEE);  Surgeon: Melrose Nakayama, MD;  Location: [facility];  Service: Open Heart Surgery;  Laterality: N/A;  . TUBAL LIGATION  1983    OB History    Gravida Para Term Preterm AB Living   2 2       2    SAB TAB Ectopic Multiple Live Births                   Home Medications    Prior to Admission medications   Medication Sig Start Date End Date Taking? Authorizing Provider  aspirin EC 325 MG EC tablet Take 1 tablet (325 mg total) by mouth daily. 03/06/16  Yes Erin R Barrett, PA-C  cinacalcet (SENSIPAR) 60 MG tablet Take 60 mg by mouth daily.    Yes Historical Provider, MD  famotidine (PEPCID) 20 MG tablet Take 20 mg by mouth daily.    Yes Historical Provider, MD  megestrol (MEGACE) 40 MG tablet Take 40 mg by mouth daily as needed for indigestion. 02/12/16  Yes Historical Provider, MD  Nutritional Supplements (NEPRO) LIQD Take 237 mLs by mouth daily.   Yes Historical Provider, MD  sevelamer (RENVELA) 800 MG tablet Take 800 mg by mouth 3 (three) times daily with meals.    Yes Historical Provider, MD    Family History Family History  Problem Relation Age of Onset  . Diabetes Mother   . Hypertension Mother   . Heart disease Mother   . Alcohol abuse Mother   . Kidney disease Mother   . Cancer Father     COLON  . Alcohol abuse Father   . Breast cancer Sister 83  . Hypertension Sister   . Kidney disease Sister   . Hypertension Son     Social History Social History  Substance Use Topics  . Smoking status: Never Smoker  . Smokeless tobacco: Never Used  . Alcohol use  No     Allergies   Contrast media [iodinated diagnostic agents] and Naproxen sodium   Review of Systems Review of Systems  Constitutional: Negative for activity change and appetite change.  HENT: Negative for facial swelling and trouble swallowing.   Eyes: Negative for pain.  Respiratory: Negative for chest tightness and shortness of breath.   Cardiovascular: Negative for chest pain and leg swelling.  Gastrointestinal: Negative for abdominal pain, diarrhea, nausea and vomiting.  Genitourinary: Negative for flank pain.  Musculoskeletal: Negative for back pain and neck stiffness.  Skin: Negative for rash.  Neurological: Positive for speech difficulty and numbness. Negative for weakness and headaches.  Psychiatric/Behavioral: Negative for behavioral problems.     Physical Exam Updated Vital Signs BP (!) 71/51   Pulse 91   Temp 98.3 F (36.8 C)   Resp 17   Ht 5\' 1"  (1.549 m)   Wt 178 lb 9.2 oz (81 kg)   SpO2 100%   BMI 33.74 kg/m   Physical  Exam  Constitutional: She is oriented to person, place, and time. She appears well-developed.  HENT:  Head: Atraumatic.  Eyes: EOM are normal.  Neck: Neck supple.  Cardiovascular: Normal rate.   Pulmonary/Chest: Effort normal.  Dialysis catheter left chest wall.  Musculoskeletal: Normal range of motion.  Neurological: She is alert and oriented to person, place, and time.  Face symmetric. Good grip strength bilaterally. Good strength in bilateral upper and lower extremity's. Normal speech. Memory appears intact. Sensation intact in bilateral hands and patient able to make an okay sign and cross fingers and give a thumbs up bilaterally.  Skin: Skin is warm.     ED Treatments / Results  Labs (all labs ordered are listed, but only abnormal results are displayed) Labs Reviewed  APTT - Abnormal; Notable for the following:       Result Value   aPTT 56 (*)    All other components within normal limits  CBC - Abnormal; Notable for the following:    Hemoglobin 11.5 (*)    RDW 16.9 (*)    All other components within normal limits  COMPREHENSIVE METABOLIC PANEL - Abnormal; Notable for the following:    Chloride 99 (*)    BUN <5 (*)    Creatinine, Ser 1.66 (*)    Calcium 8.7 (*)    Total Protein 6.3 (*)    Albumin 2.6 (*)    ALT 13 (*)    Alkaline Phosphatase 176 (*)    GFR calc non Af Amer 32 (*)    GFR calc Af Amer 37 (*)    All other components within normal limits  I-STAT CHEM 8, ED - Abnormal; Notable for the following:    Chloride 99 (*)    BUN <3 (*)    Creatinine, Ser 1.60 (*)    Calcium, Ion 1.04 (*)    All other components within normal limits  ETHANOL  PROTIME-INR  DIFFERENTIAL  I-STAT TROPOININ, ED    EKG  EKG Interpretation  Date/Time:  Wednesday May 16 2016 12:03:26 EDT Ventricular Rate:  79 PR Interval:    QRS Duration: 108 QT Interval:  380 QTC Calculation: 436 R Axis:   69 Text Interpretation:  Sinus rhythm Borderline repolarization abnormality No  significant change since last tracing Confirmed by Alvino Chapel  MD, Elad Macphail [phone]) on 05/16/2016 12:25:21 PM       Radiology Dg Chest 2 View  Result Date: 05/16/2016 CLINICAL DATA:  Weakness, aphasia EXAM: CHEST  2 VIEW COMPARISON:  05/15/2016 FINDINGS: There is no focal parenchymal opacity. There is no pleural effusion or pneumothorax. The heart and mediastinal contours are unremarkable. There is evidence of prior median sternotomy with aortic valve replacement. There is a dual-lumen left-sided central venous catheter in satisfactory position. Right subclavian stent is noted. The osseous structures are unremarkable. IMPRESSION: No active cardiopulmonary disease. Electronically Signed   By: Kathreen Devoid   On: 05/16/2016 12:54   Dg Chest 2 View  Result Date: 05/15/2016 CLINICAL DATA:  Weakness, shortness of breath with exertion, status post AVR EXAM: CHEST  2 VIEW COMPARISON:  03/02/2016 FINDINGS: Lungs are clear.  No pleural effusion or pneumothorax. Heart is normal in size.  Prosthetic aortic valve. Left dual lumen catheter terminates in the upper right atrium. Bilateral axillary stents. Median sternotomy. IMPRESSION: No evidence of acute cardiopulmonary disease. Postsurgical/postprocedural changes, as above. Electronically Signed   By: Julian Hy M.D.   On: 05/15/2016 16:18   Ct Head Wo Contrast  Result Date: 05/16/2016 CLINICAL DATA:  Patient riding in car when suddenly unable to speak with LEFT arm numbness. Speech arrest has improved. EXAM: CT HEAD WITHOUT CONTRAST TECHNIQUE: Contiguous axial images were obtained from the base of the skull through the vertex without intravenous contrast. COMPARISON:  MR brain 02/15/2016. FINDINGS: Brain: No evidence for acute infarction, hemorrhage, mass lesion, hydrocephalus, or extra-axial fluid. Generalized atrophy. Extensive hypoattenuation of white matter correlating with chronic microvascular ischemic change noted on MR. No CT signs of acute cortical  infarction. Vascular: Mild vascular calcification in the skull base carotid arteries, without signs of large vessel occlusion. Skull: Normal. Negative for fracture or focal lesion. Sinuses/Orbits: No acute finding. Other: Trace layering fluid RIGHT mastoid. No nasopharyngeal process. IMPRESSION: Negative for acute cortical infarction or proximal large vessel occlusion. Atrophy and small vessel disease appears similar to priors. Electronically Signed   By: Staci Righter M.D.   On: 05/16/2016 12:40    Procedures Procedures (including critical care time)  Medications Ordered in ED Medications  sodium chloride 0.9 % bolus 500 mL (500 mLs Intravenous New Bag/Given 05/16/16 1323)     Initial Impression / Assessment and Plan / ED Course  I have reviewed the triage vital signs and the nursing notes.  Pertinent labs & imaging results that were available during my care of the patient were reviewed by me and considered in my medical decision making (see chart for details).     Patient presents with aphasia. Had difficulty speaking while in the car. Also tingling in the hand. Speaking has returned. Some mild paresthesias of left hand. Not a TPA candidate due to the improvement of the symptoms. Labs overall reassuring. Has hypotension which is not unusual for the patient after dialysis. Will admit to internal medicine with neurology consult.  Final Clinical Impressions(s) / ED Diagnoses   Final diagnoses:  Aphasia  End stage renal disease on dialysis [facility])    New Prescriptions New Prescriptions   No medications on file     Davonna Belling, MD 05/16/16 1543

## 2016-05-16 NOTE — Progress Notes (Signed)
Neurology paged

## 2016-05-16 NOTE — Consult Note (Signed)
Requesting Physician: Dr. Alvino Chapel    Chief Complaint: Speech arrest  History obtained from:  Patient     HPI:                                                                                                                                         [name] Marks is an 61 y.o. female who has known end-stage renal disease and is on dialysis. Patient went to dialysis this morning and was doing well. After dialysis her son picked her up and as she was driving in the car she noted that she suddenly was unable to get her words out. This progressed to the point where she states that" she could not make any sound in her mouth felt like it was full of mud".  Patient got to the emergency department where her speech seemed to improve, blood pressure was obtained and showed to be systolically in the 62B diastolically in the 76E. Patient states the whole scenario lasted for approximately 15 minutes and then resolve. At the time of consultation patient had artery received 1 L of fluids and her systolic blood pressure was back up to 83. Patient states that her normal blood pressure is usually in the 831D systolically and 17O diastolically. She has never had a stroke nor has she had any symptoms like this previously. At this time she feels like she is back to her baseline. The family teaching service requested that neurology be involved.  Date last known well: Date: 05/16/2016 Time last known well: Time: 10:30 tPA Given: No: Symptoms resolved   Past Medical History:  Diagnosis Date  . Complication of anesthesia    due to kidney disease  . End stage renal disease on dialysis ([facility])   . GERD (gastroesophageal reflux disease)   . Gout     Past Surgical History:  Procedure Laterality Date  . AORTIC VALVE REPLACEMENT N/A 02/25/2016   Procedure: AORTIC VALVE REPLACEMENT (AVR) implanted with Magna Ease Aortic valve size 50mm;  Surgeon: Melrose Nakayama, MD;  Location: [place];  Service: Open Heart Surgery;   Laterality: N/A;  . [place] Right 02/17/2016   Procedure: REMOVAL OF TWO ARTERIOVENOUS GORETEX GRAFTS ([place]);  Surgeon: Angelia Mould, MD;  Location: [facility] OR;  Service: Vascular;  Laterality: Right;  . DG AV DIALYSIS GRAFT DECLOT OR    . DILATATION & CURETTAGE/HYSTEROSCOPY WITH TRUECLEAR N/A 11/06/2012   Procedure: DILATATION & CURETTAGE/HYSTEROSCOPY WITH TRUECLEAR;  Surgeon: Terrance Mass, MD;  Location: [place] [facility];  Service: Gynecology;  Laterality: N/A;  Truclear Resectoscopic Polypectomy   . INSERTION OF DIALYSIS CATHETER N/A 02/19/2016   Procedure: INSERTION OF Left Internal Jugular DIALYSIS CATHETER;  Surgeon: Angelia Mould, MD;  Location: [place];  Service: Vascular;  Laterality: N/A;  . PATCH ANGIOPLASTY Right 02/17/2016   Procedure: PATCH ANGIOPLASTY;  Surgeon: Angelia Mould, MD;  Location: [place];  Service: Vascular;  Laterality: Right;  . PERIPHERAL VASCULAR CATHETERIZATION N/A 09/14/2014   Procedure: A/V Shuntogram/Fistulagram;  Surgeon: Katha Cabal, MD;  Location: [place] [facility];  Service: Cardiovascular;  Laterality: N/A;  . PERIPHERAL VASCULAR CATHETERIZATION N/A 09/14/2014   Procedure: A/V Shunt Intervention;  Surgeon: Katha Cabal, MD;  Location: [place] [facility];  Service: Cardiovascular;  Laterality: N/A;  . PERIPHERAL VASCULAR CATHETERIZATION Right 12/09/2014   Procedure: A/V Shuntogram/Fistulagram;  Surgeon: Algernon Huxley, MD;  Location: [place] [facility];  Service: Cardiovascular;  Laterality: Right;  . PERIPHERAL VASCULAR CATHETERIZATION N/A 12/09/2014   Procedure: A/V Shunt Intervention;  Surgeon: Algernon Huxley, MD;  Location: [place] [facility];  Service: Cardiovascular;  Laterality: N/A;  . PERIPHERAL VASCULAR CATHETERIZATION Right 05/24/2015   Procedure: A/V Shuntogram;  Surgeon: Serafina Mitchell, MD;  Location: [place] [facility];  Service: Cardiovascular;  Laterality: Right;  . PERIPHERAL VASCULAR CATHETERIZATION Right  05/24/2015   Procedure: Peripheral Vascular Balloon Angioplasty;  Surgeon: Serafina Mitchell, MD;  Location: [place] [facility];  Service: Cardiovascular;  Laterality: Right;  right arm shunt  . PERIPHERAL VASCULAR CATHETERIZATION N/A 06/13/2015   Procedure: A/V Shuntogram/Fistulagram;  Surgeon: Algernon Huxley, MD;  Location: [place] [facility];  Service: Cardiovascular;  Laterality: N/A;  . PERIPHERAL VASCULAR CATHETERIZATION N/A 06/13/2015   Procedure: A/V Shunt Intervention;  Surgeon: Algernon Huxley, MD;  Location: [place] [facility];  Service: Cardiovascular;  Laterality: N/A;  . TEE WITHOUT CARDIOVERSION N/A 02/22/2016   Procedure: TRANSESOPHAGEAL ECHOCARDIOGRAM (TEE);  Surgeon: Pixie Casino, MD;  Location[facility];  Service: Cardiovascular;  Laterality: N/A;  . TEE WITHOUT CARDIOVERSION N/A 02/25/2016   Procedure: TRANSESOPHAGEAL ECHOCARDIOGRAM (TEE);  Surgeon: Melrose Nakayama, MD;  Location: [place];  Service: Open Heart Surgery;  Laterality: N/A;  . TUBAL LIGATION  1983    Family History  Problem Relation Age of Onset  . Diabetes Mother   . Hypertension Mother   . Heart disease Mother   . Alcohol abuse Mother   . Kidney disease Mother   . Cancer Father     COLON  . Alcohol abuse Father   . Breast cancer Sister 74  . Hypertension Sister   . Kidney disease Sister   . Hypertension Son    Social History:  reports that she has never smoked. She has never used smokeless tobacco. She reports that she does not drink alcohol or use drugs.  Allergies:  Allergies  Allergen Reactions  . Contrast Media [Iodinated Diagnostic Agents] Anaphylaxis  . Naproxen Sodium Itching    Medications:                                                                                                                           No current facility-administered medications for this encounter.    Current Outpatient Prescriptions  Medication Sig Dispense Refill  . aspirin EC 325 MG EC tablet Take 1  tablet (325 mg total) by mouth daily. 30 tablet 0  . cinacalcet (SENSIPAR) 60 MG tablet Take 60 mg by mouth daily.    . famotidine (PEPCID) 20 MG tablet Take 20 mg by mouth daily.     . megestrol (MEGACE) 40 MG tablet Take 40 mg by mouth daily as needed for indigestion.    . Nutritional Supplements (NEPRO) LIQD Take 237 mLs by mouth daily.    . sevelamer (RENVELA) 800 MG tablet Take 800 mg by mouth 3 (three) times daily with meals.        ROS:                                                                                                                                       History obtained from the patient  General ROS: negative for - chills, fatigue, fever, night sweats, weight gain or weight loss Psychological ROS: negative for - behavioral disorder, hallucinations, memory difficulties, mood swings or suicidal ideation Ophthalmic ROS: negative for - blurry vision, double vision, eye pain or loss of vision ENT ROS: negative for - epistaxis, nasal discharge, oral lesions, sore throat, tinnitus or vertigo Allergy and Immunology ROS: negative for - hives or itchy/watery eyes Hematological and Lymphatic ROS: negative for - bleeding problems, bruising or swollen lymph nodes Endocrine ROS: negative for - galactorrhea, hair pattern changes, polydipsia/polyuria or temperature intolerance Respiratory ROS: negative for - cough, hemoptysis, shortness of breath or wheezing Cardiovascular ROS: negative for - chest pain, dyspnea on exertion, edema or irregular heartbeat Gastrointestinal ROS: negative for - abdominal pain, diarrhea, hematemesis, nausea/vomiting or stool incontinence Genito-Urinary ROS: negative for - dysuria, hematuria, incontinence or urinary frequency/urgency Musculoskeletal ROS: negative for - joint swelling or muscular weakness Neurological ROS: as noted in HPI Dermatological ROS: negative for rash and skin lesion changes  Neurologic Examination:                                                                                                       Blood pressure (!) 71/51, pulse 91, temperature 98.3 F (36.8 C), resp. rate 17, height 5\' 1"  (1.549 m), weight 81 kg (178 lb 9.2 oz), SpO2 100 %.  HEENT-  Normocephalic, no lesions, without obvious abnormality.  Normal external eye and conjunctiva.  Normal TM's bilaterally.  Normal auditory canals and external ears. Normal external nose, mucus membranes and septum.  Normal pharynx. Cardiovascular- S1, S2 normal, pulses palpable throughout   Lungs- chest clear, no wheezing, rales, normal symmetric  air entry Abdomen- normal findings: bowel sounds normal Extremities- no edema Lymph-no adenopathy palpable Musculoskeletal-no joint tenderness, deformity or swelling Skin-warm and dry, no hyperpigmentation, vitiligo, or suspicious lesions  Neurological Examination Mental Status: Alert, oriented, thought content appropriate.  Speech fluent without evidence of aphasia.  Able to follow 3 step commands without difficulty. Cranial Nerves: II:  Visual fields grossly normal,  III,IV, VI: ptosis not present, extra-ocular motions intact bilaterally, pupils equal, round, reactive to light and accommodation V,VII: smile symmetric, facial light touch sensation normal bilaterally VIII: hearing normal bilaterally IX,X: uvula rises symmetrically XI: bilateral shoulder shrug XII: midline tongue extension Motor: Patient is weak throughout. Her upper body strength is approximately 4/5 with her lower body strength being 4 minus/5. Patient states that she is always had weak lower extremity strength. 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 Deep Tendon Reflexes: 2+ and symmetric throughout upper extremities 1+ at the knee jerks no ankle jerks and downgoing toes Plantars: Right: downgoing   Left: downgoing Cerebellar: normal finger-to-nose,  Gait: Gait not tested due  to safety       Lab Results: Basic Metabolic Panel:  Recent Labs Lab 05/16/16 1204 05/16/16 1221  NA 137 137  K 4.3 4.2  CL 99* 99*  CO2 29  --   GLUCOSE 79 78  BUN <5* <3*  CREATININE 1.66* 1.60*  CALCIUM 8.7*  --     Liver Function Tests:  Recent Labs Lab 05/16/16 1204  AST 26  ALT 13*  ALKPHOS 176*  BILITOT 0.8  PROT 6.3*  ALBUMIN 2.6*   No results for input(s): LIPASE, AMYLASE in the last 168 hours. No results for input(s): AMMONIA in the last 168 hours.  CBC:  Recent Labs Lab 05/16/16 1204 05/16/16 1221  WBC 5.5  --   NEUTROABS 3.0  --   HGB 11.5* 13.6  HCT 36.1 40.0  MCV 88.7  --   PLT 180  --     Cardiac Enzymes: No results for input(s): CKTOTAL, CKMB, CKMBINDEX, TROPONINI in the last 168 hours.  Lipid Panel: No results for input(s): CHOL, TRIG, HDL, CHOLHDL, VLDL, LDLCALC in the last 168 hours.  CBG: No results for input(s): GLUCAP in the last 168 hours.  Microbiology: Results for orders placed or performed during the hospital encounter of 02/15/16  Culture, blood (Routine X 2) w Reflex to ID Panel     Status: Abnormal   Collection Time: 02/15/16 10:20 AM  Result Value Ref Range Status   Specimen Description BLOOD LEFT HAND  Final   Special Requests IN PEDIATRIC BOTTLE  2CC  Final   Culture  Setup Time   Final    GRAM POSITIVE COCCI IN CLUSTERS IN PEDIATRIC BOTTLE CRITICAL VALUE NOTED.  VALUE IS CONSISTENT WITH PREVIOUSLY REPORTED AND CALLED VALUE.    Culture STAPHYLOCOCCUS LUGDUNENSIS (A)  Final   Report Status 02/19/2016 FINAL  Final   Organism ID, Bacteria STAPHYLOCOCCUS LUGDUNENSIS  Final      Susceptibility   Staphylococcus lugdunensis - MIC*    CIPROFLOXACIN 1 SENSITIVE Sensitive     ERYTHROMYCIN <=0.25 SENSITIVE Sensitive     GENTAMICIN <=0.5 SENSITIVE Sensitive     OXACILLIN 0.5 SENSITIVE Sensitive     TETRACYCLINE <=1 SENSITIVE Sensitive     VANCOMYCIN <=0.5 SENSITIVE Sensitive     TRIMETH/SULFA <=10 SENSITIVE  Sensitive     CLINDAMYCIN <=0.25 SENSITIVE Sensitive     RIFAMPIN <=0.5 SENSITIVE Sensitive  Inducible Clindamycin NEGATIVE Sensitive     * STAPHYLOCOCCUS LUGDUNENSIS  Culture, blood (Routine X 2) w Reflex to ID Panel     Status: Abnormal   Collection Time: 02/15/16 10:27 AM  Result Value Ref Range Status   Specimen Description BLOOD LEFT HAND  Final   Special Requests IN PEDIATRIC BOTTLE  2CC  Final   Culture  Setup Time   Final    GRAM POSITIVE COCCI IN CLUSTERS IN PEDIATRIC BOTTLE CRITICAL RESULT CALLED TO, READ BACK BY AND VERIFIED WITH: VERONDA BRYK,PHARMD @0644  02/16/16 MKELLY,MLT    Culture (A)  Final    STAPHYLOCOCCUS LUGDUNENSIS SUSCEPTIBILITIES PERFORMED ON PREVIOUS CULTURE WITHIN THE LAST 5 DAYS.    Report Status 02/19/2016 FINAL  Final  Blood Culture ID Panel (Reflexed)     Status: Abnormal   Collection Time: 02/15/16 10:27 AM  Result Value Ref Range Status   Enterococcus species NOT DETECTED NOT DETECTED Final   Listeria monocytogenes NOT DETECTED NOT DETECTED Final   Staphylococcus species DETECTED (A) NOT DETECTED Final    Comment: CRITICAL RESULT CALLED TO, READ BACK BY AND VERIFIED WITH: V BRYK, PHARMD @0644  02/16/16 MKELLY,MLT    Staphylococcus aureus NOT DETECTED NOT DETECTED Final   Methicillin resistance NOT DETECTED NOT DETECTED Final   Streptococcus species NOT DETECTED NOT DETECTED Final   Streptococcus agalactiae NOT DETECTED NOT DETECTED Final   Streptococcus pneumoniae NOT DETECTED NOT DETECTED Final   Streptococcus pyogenes NOT DETECTED NOT DETECTED Final   Acinetobacter baumannii NOT DETECTED NOT DETECTED Final   Enterobacteriaceae species NOT DETECTED NOT DETECTED Final   Enterobacter cloacae complex NOT DETECTED NOT DETECTED Final   Escherichia coli NOT DETECTED NOT DETECTED Final   Klebsiella oxytoca NOT DETECTED NOT DETECTED Final   Klebsiella pneumoniae NOT DETECTED NOT DETECTED Final   Proteus species NOT DETECTED NOT DETECTED Final    Serratia marcescens NOT DETECTED NOT DETECTED Final   Haemophilus influenzae NOT DETECTED NOT DETECTED Final   Neisseria meningitidis NOT DETECTED NOT DETECTED Final   Pseudomonas aeruginosa NOT DETECTED NOT DETECTED Final   Candida albicans NOT DETECTED NOT DETECTED Final   Candida glabrata NOT DETECTED NOT DETECTED Final   Candida krusei NOT DETECTED NOT DETECTED Final   Candida parapsilosis NOT DETECTED NOT DETECTED Final   Candida tropicalis NOT DETECTED NOT DETECTED Final  MRSA culture     Status: None   Collection Time: 02/16/16  5:37 AM  Result Value Ref Range Status   Specimen Description NASOPHARYNGEAL  Final   Special Requests NONE  Final   Culture NO MRSA DETECTED  Final   Report Status 02/18/2016 FINAL  Final  Surgical pcr screen     Status: None   Collection Time: 02/16/16  1:30 PM  Result Value Ref Range Status   MRSA, PCR NEGATIVE NEGATIVE Final   Staphylococcus aureus NEGATIVE NEGATIVE Final    Comment:        The Xpert SA Assay (FDA approved for NASAL specimens in patients over 75 years of age), is one component of a comprehensive surveillance program.  Test performance has been validated by [facility] for patients greater than or equal to 31 year old. It is not intended to diagnose infection nor to guide or monitor treatment.   Aerobic/Anaerobic Culture (surgical/deep wound)     Status: None   Collection Time: 02/17/16  3:08 PM  Result Value Ref Range Status   Specimen Description WOUND RIGHT ARM  Final   Special Requests INFECTED AVG GRAFT  PT ON ANCEF  Final   Gram Stain   Final    FEW WBC PRESENT,BOTH PMN AND MONONUCLEAR FEW GRAM POSITIVE COCCI IN PAIRS AND CHAINS    Culture   Final    MODERATE STAPHYLOCOCCUS LUGDUNENSIS NO ANAEROBES ISOLATED    Report Status 02/22/2016 FINAL  Final   Organism ID, Bacteria STAPHYLOCOCCUS LUGDUNENSIS  Final      Susceptibility   Staphylococcus lugdunensis - MIC*    CIPROFLOXACIN 1 INTERMEDIATE Intermediate      ERYTHROMYCIN <=0.25 SENSITIVE Sensitive     GENTAMICIN <=0.5 SENSITIVE Sensitive     OXACILLIN 0.5 SENSITIVE Sensitive     TETRACYCLINE <=1 SENSITIVE Sensitive     VANCOMYCIN <=0.5 SENSITIVE Sensitive     TRIMETH/SULFA <=10 SENSITIVE Sensitive     CLINDAMYCIN <=0.25 SENSITIVE Sensitive     RIFAMPIN <=0.5 SENSITIVE Sensitive     Inducible Clindamycin NEGATIVE Sensitive     * MODERATE STAPHYLOCOCCUS LUGDUNENSIS  Culture, blood (routine x 2)     Status: Abnormal   Collection Time: 02/17/16 10:30 PM  Result Value Ref Range Status   Specimen Description BLOOD LEFT ANTECUBITAL  Final   Special Requests IN PEDIATRIC BOTTLE 3CC  Final   Culture  Setup Time   Final    GRAM POSITIVE COCCI IN CLUSTERS IN PEDIATRIC BOTTLE CRITICAL RESULT CALLED TO, READ BACK BY AND VERIFIED WITH: J LEDFORD,PHARMD AT 0213 02/19/16 T [place]    Culture (A)  Final    STAPHYLOCOCCUS LUGDUNENSIS SUSCEPTIBILITIES PERFORMED ON PREVIOUS CULTURE WITHIN THE LAST 5 DAYS.    Report Status 02/20/2016 FINAL  Final  Blood Culture ID Panel (Reflexed)     Status: Abnormal   Collection Time: 02/17/16 10:30 PM  Result Value Ref Range Status   Enterococcus species NOT DETECTED NOT DETECTED Final   Listeria monocytogenes NOT DETECTED NOT DETECTED Final   Staphylococcus species DETECTED (A) NOT DETECTED Final    Comment: CRITICAL RESULT CALLED TO, READ BACK BY AND VERIFIED WITH: TO JLEDFORD(PHARD) BY [place] 02/19/2016 AT 2:13AM    Staphylococcus aureus NOT DETECTED NOT DETECTED Final   Methicillin resistance NOT DETECTED NOT DETECTED Final   Streptococcus species NOT DETECTED NOT DETECTED Final   Streptococcus agalactiae NOT DETECTED NOT DETECTED Final   Streptococcus pneumoniae NOT DETECTED NOT DETECTED Final   Streptococcus pyogenes NOT DETECTED NOT DETECTED Final   Acinetobacter baumannii NOT DETECTED NOT DETECTED Final   Enterobacteriaceae species NOT DETECTED NOT DETECTED Final   Enterobacter cloacae complex NOT  DETECTED NOT DETECTED Final   Escherichia coli NOT DETECTED NOT DETECTED Final   Klebsiella oxytoca NOT DETECTED NOT DETECTED Final   Klebsiella pneumoniae NOT DETECTED NOT DETECTED Final   Proteus species NOT DETECTED NOT DETECTED Final   Serratia marcescens NOT DETECTED NOT DETECTED Final   Haemophilus influenzae NOT DETECTED NOT DETECTED Final   Neisseria meningitidis NOT DETECTED NOT DETECTED Final   Pseudomonas aeruginosa NOT DETECTED NOT DETECTED Final   Candida albicans NOT DETECTED NOT DETECTED Final   Candida glabrata NOT DETECTED NOT DETECTED Final   Candida krusei NOT DETECTED NOT DETECTED Final   Candida parapsilosis NOT DETECTED NOT DETECTED Final   Candida tropicalis NOT DETECTED NOT DETECTED Final  Culture, blood (routine x 2)     Status: Abnormal   Collection Time: 02/17/16 10:38 PM  Result Value Ref Range Status   Specimen Description BLOOD LEFT ANTECUBITAL  Final   Special Requests IN PEDIATRIC BOTTLE [facility]  Final   Culture  Setup Time  Final    GRAM POSITIVE COCCI IN CLUSTERS IN PEDIATRIC BOTTLE CRITICAL RESULT CALLED TO, READ BACK BY AND VERIFIED WITH: G ABBOTT,PHARMD AT [place] 02/19/16 BY L BENFIELD    Culture (A)  Final    STAPHYLOCOCCUS LUGDUNENSIS SUSCEPTIBILITIES PERFORMED ON PREVIOUS CULTURE WITHIN THE LAST 5 DAYS.    Report Status 02/20/2016 FINAL  Final  Culture, blood (Routine X 2) w Reflex to ID Panel     Status: None   Collection Time: 02/18/16 11:03 AM  Result Value Ref Range Status   Specimen Description BLOOD LEFT ARM  Final   Special Requests   Final    BOTTLES DRAWN AEROBIC AND ANAEROBIC AEB=6CC, ANA=4CC   Culture NO GROWTH 5 DAYS  Final   Report Status 02/23/2016 FINAL  Final  Culture, blood (Routine X 2) w Reflex to ID Panel     Status: None   Collection Time: 02/18/16 11:03 AM  Result Value Ref Range Status   Specimen Description BLOOD RIGHT ARM  Final   Special Requests IN PEDIATRIC BOTTLE 1 CC  Final   Culture NO GROWTH 5 DAYS  Final    Report Status 02/23/2016 FINAL  Final  Culture, blood (Routine X 2) w Reflex to ID Panel     Status: None   Collection Time: 02/20/16 12:03 AM  Result Value Ref Range Status   Specimen Description BLOOD LEFT HAND  Final   Special Requests IN PEDIATRIC BOTTLE 3CC  Final   Culture NO GROWTH 5 DAYS  Final   Report Status 02/25/2016 FINAL  Final  Culture, blood (Routine X 2) w Reflex to ID Panel     Status: None   Collection Time: 02/20/16 12:10 AM  Result Value Ref Range Status   Specimen Description BLOOD LEFT HAND  Final   Special Requests IN PEDIATRIC BOTTLE 3CC  Final   Culture NO GROWTH 5 DAYS  Final   Report Status 02/25/2016 FINAL  Final  Aerobic/Anaerobic Culture (surgical/deep wound)     Status: None   Collection Time: 02/25/16 10:00 AM  Result Value Ref Range Status   Specimen Description TISSUE  Final   Special Requests AORTIC VALVE LEAFLET POF ZINACEF AND VANC  Final   Gram Stain   Final    NO WBC SEEN RARE GRAM POSITIVE COCCI IN PAIRS GRAM STAIN REVIEWED-AGREE WITH RESULT L BENFIELD    Culture   Final    FEW STAPHYLOCOCCUS LUGDUNENSIS NO ANAEROBES ISOLATED    Report Status 03/01/2016 FINAL  Final   Organism ID, Bacteria STAPHYLOCOCCUS LUGDUNENSIS  Final      Susceptibility   Staphylococcus lugdunensis - MIC*    CIPROFLOXACIN 1 INTERMEDIATE Intermediate     ERYTHROMYCIN <=0.25 SENSITIVE Sensitive     GENTAMICIN <=0.5 SENSITIVE Sensitive     OXACILLIN 0.5 SENSITIVE Sensitive     TETRACYCLINE <=1 SENSITIVE Sensitive     VANCOMYCIN <=0.5 SENSITIVE Sensitive     TRIMETH/SULFA <=10 SENSITIVE Sensitive     CLINDAMYCIN <=0.25 SENSITIVE Sensitive     RIFAMPIN <=0.5 SENSITIVE Sensitive     Inducible Clindamycin NEGATIVE Sensitive     * FEW STAPHYLOCOCCUS LUGDUNENSIS    Coagulation Studies:  Recent Labs  05/16/16 1204  LABPROT 13.8  INR 1.06    Imaging: Dg Chest 2 View  Result Date: 05/16/2016 CLINICAL DATA:  Weakness, aphasia EXAM: CHEST  2 VIEW COMPARISON:   05/15/2016 FINDINGS: There is no focal parenchymal opacity. There is no pleural effusion or pneumothorax. The heart and mediastinal contours are  unremarkable. There is evidence of prior median sternotomy with aortic valve replacement. There is a dual-lumen left-sided central venous catheter in satisfactory position. Right subclavian stent is noted. The osseous structures are unremarkable. IMPRESSION: No active cardiopulmonary disease. Electronically Signed   By: Kathreen Devoid   On: 05/16/2016 12:54   Dg Chest 2 View  Result Date: 05/15/2016 CLINICAL DATA:  Weakness, shortness of breath with exertion, status post AVR EXAM: CHEST  2 VIEW COMPARISON:  03/02/2016 FINDINGS: Lungs are clear.  No pleural effusion or pneumothorax. Heart is normal in size.  Prosthetic aortic valve. Left dual lumen catheter terminates in the upper right atrium. Bilateral axillary stents. Median sternotomy. IMPRESSION: No evidence of acute cardiopulmonary disease. Postsurgical/postprocedural changes, as above. Electronically Signed   By: Julian Hy M.D.   On: 05/15/2016 16:18   Ct Head Wo Contrast  Result Date: 05/16/2016 CLINICAL DATA:  Patient riding in car when suddenly unable to speak with LEFT arm numbness. Speech arrest has improved. EXAM: CT HEAD WITHOUT CONTRAST TECHNIQUE: Contiguous axial images were obtained from the base of the skull through the vertex without intravenous contrast. COMPARISON:  MR brain 02/15/2016. FINDINGS: Brain: No evidence for acute infarction, hemorrhage, mass lesion, hydrocephalus, or extra-axial fluid. Generalized atrophy. Extensive hypoattenuation of white matter correlating with chronic microvascular ischemic change noted on MR. No CT signs of acute cortical infarction. Vascular: Mild vascular calcification in the skull base carotid arteries, without signs of large vessel occlusion. Skull: Normal. Negative for fracture or focal lesion. Sinuses/Orbits: No acute finding. Other: Trace layering  fluid RIGHT mastoid. No nasopharyngeal process. IMPRESSION: Negative for acute cortical infarction or proximal large vessel occlusion. Atrophy and small vessel disease appears similar to priors. Electronically Signed   By: Staci Righter M.D.   On: 05/16/2016 12:40       Assessment and plan discussed with with attending physician and they are in agreement.    Etta Quill PA-C [facility] [phone]  05/16/2016, 3:11 PM   Assessment: 61 y.o. female postdialysis with a approximately 15 minute period of expressive aphasia which resolved with fluid resuscitation. Most likely etiology is hypotension causing low cerebral perfusion which resolved after fluid resuscitation however given her background as a dialysis patient and or vascular integrity cannot rule out hypoperfusion stroke.  Stroke Risk Factors - none  Recommend: 1-MRI brain to rule out CVA. If MRI brain is negative would not further workup stroke workup. 2-content with fluid resuscitation 3- obtain fasting lipid panel and A1c.

## 2016-05-16 NOTE — ED Notes (Signed)
EKG given to Dr Pickering 

## 2016-05-16 NOTE — ED Notes (Signed)
Report attempted x2

## 2016-05-16 NOTE — ED Notes (Signed)
Neither primary nurse or charge nurse are able to take report for patient.

## 2016-05-16 NOTE — Progress Notes (Signed)
Patient arrived to unit alert and oriented to self and place, disoriented to time and situation. Patient very lethargic. Patient c/o 7/10 frontal h/a. Patient follows commands with difficulty, poor attention/concentration. Patient placed on telemetry box #12, CCMD notified and box verified. Q 2 hr neuro checks intitiated. RN will continue to monitor.

## 2016-05-16 NOTE — ED Triage Notes (Signed)
Pt riding in car with son. 11:30 LSN. Pt's son reports she suddenly was not able to speak. L arm numbness. Upon arrival to ED- pt able to answer questions, left arm tingling. Pt cleared for code stroke with Dr. Rex Kras. No code stroke at this time. Pt taken to A5 and MD at bedside

## 2016-05-16 NOTE — ED Notes (Signed)
Report attempted x 1

## 2016-05-16 NOTE — H&P (Signed)
[facility] Admission History and Physical Service Pager: [phone]  Patient name: [name] Marks Medical record number: [id number] Date of birth: 1955-06-02 Age: 61 y.o. Gender: female  Primary Care Provider: Lockie Pares, MD Consultants: neurology Code Status: DNR/DNI - meds and mask only, no chest compressions or intubation  Chief Complaint: aphasia and L hand numbness  Assessment and Plan: [name] Marks is a 61 y.o. female presenting with sudden onset aphasia and LUE weakness that lasted for 15 min and resolved on ED presentation. 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   Expressive Aphasia and L hand weakness, resolved: presenting with sudden onset aphasia, feeling of something in her mouth, and L hand weakness and numbness which lasted for ~15 minutes and resolved on presentation to ED. Concern for stroke/TIA given this history. Additionally, patient denies any prodrome, abnormal limb movements, hx of seizures, or LOC. She does endorse right sided headache. CT head NAICA. No hx of stroke, patient denies being on ASA or plavix. Hypotension after HD causing cerebral hypoperfusion could have also been the cause. Pt not taking her home [name] Marks. Her neuro exam is non-focal in the ED. -admit to tele under observation status, Dr. Ardelia Mems attending -neurology consulted from ED, appreciate recs -MRI brain -echo -TSH -carotid dopplers -HgbA1c -lipid panel  -resume home ASA 325mg  -Neuro checks -NPO for now, pending RN bedside swallow -PT/OT/SLP  ESRD on HD MWF: Hx of access issues stemming from multiple failed fistulas and AV grafts. Last AV graft was prior to 02/15/16 admission and likely source of bacteremia at that time leading to endocarditis and valve replacement. Currently accessed via tunnel HD cath in L neck. Reports she was  supposed to go to a vascular appointment today to check on the HD cath and sutures in her RUE from prior graft. Received HD today. K 4.3 in the ED. -follow renal function -continue home Sensipar and Renvenla -consult nephrology if expected to stay through 3/22 for additional HD -consider calling Dr. Nicole Marks office in am to discuss suture removal  Chronic Hypotension: Pt with known hypotension, worse on HD days. BP in the ED was initially 77/53, but improved to 107/65 after receiving a 500cc bolus in the ED. Pt has not been taking her [name] Marks. She is prescribed [name] Marks 10mg  bid prn. -Monitor BPs closely and will restart home [name] Marks as needed -Check AM cortisol, as [name] Marks can cause adrenal suppression.  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 She was treated with a 6 week course of Ancef and Rifampin. Will order blood cultures as patient with history of endocarditis and bacteremia could cause embolic event. No fevers, chills, or other infectious symptoms. - blood cultures x 2  - repeat echo as above  Complex Atypical Endometrial Hyperplasia: patient with hx of known fibroids and concerning endometrial thickness on [place] 10/2015. Endometrial biopsy conducted showing complex atypical hyperplasia, which has a 20-30% risk of progression to cancer. GYN discussed case with gyn-onc who agreed to [name] Marks 80mg  daily x 3 months and then repeat endometrial biopsy. Patient has not had this that I can see from chart review. Last note from GYN recommended staying on [name] Marks 40mg  BID until further gyn-onc eval.  - Will hold home [name] Marks for now in the setting of concern for stroke, as it can cause thromboembolism - Can likely restart [name] Marks tomorrow  if MRI is negative.  Goals of care: patient would like no CPR nor intubation, ok with meds and non invasive respiratory support. States that both her daughter and her son help her make medical  decisions. I recommended that she speak to her children and get HCPOA established.  -consult chaplain and CSW for HCPOA  FEN/GI: NPO pending bedside swallow Prophylaxis: SCDs for now, start Heparin sq if MRI is negative  Disposition: admit to tele under observation status  History of Present Illness:  [name] Marks is a 61 y.o. female presenting with sudden onset aphasia and LUE weakness starting just prior to arrival to the ED while riding in the car. She reports that this morning she was feeling her normal self prior to arrival to HD. She completed HD without issue this morning. She was riding in the car on the way to her vascular appointment with her son when all of a sudden she felt like there was something thick in her mouth and she could not make any words come out. She also felt that her left hand was weak. She denies any prodrome, has been feeling her normal self for several weeks (no new weakness, fevers, cough, pain). Notably, Ms. Marks only rarely makes urine. These symptoms lasted about 15 minutes and resolved on presentation to the ED. Her son brought her to the ED, but was not present on my exam.   In ED, hypotensive to 75/35 and given 518mL NS bolus but mentating appropriately. CT head NAICA. Neuro exam for ED MD nonfocal.  Review Of Systems: Per HPI with the following additions: Denies CP, SOB, weakness, fever, cough, constipation.   ROS  Patient Active Problem List   Diagnosis Date Noted  . CVA (cerebral vascular accident) ([place]) 05/16/2016  . History of aortic valve replacement with bioprosthetic valve 05/08/2016  . S/P AVR 02/25/2016  . Bacteremia due to coagulase-negative Staphylococcus   . S/P dialysis catheter insertion ([place])   . Bilateral low back pain with sciatica   . Neck pain   . Staphylococcus aureus bacteremia   . Anemia   . Septic shock ([place])   . Renal dialysis device, implant, or graft complication 08/65/7846  . Pain and swelling of right upper extremity  12/20/2015  . Complex endometrial hyperplasia with atypia 11/08/2015  . Increased endometrial stripe thickness 11/03/2015  . Obesity 10/28/2014  . Shoulder pain, bilateral 10/28/2014  . Health care maintenance 10/28/2014  . Right carotid bruit 01/08/2013  . Generalized headaches 01/08/2013  . Chronic female pelvic pain 08/08/2012  . Gout, unspecified 12/06/2008  . Esophageal reflux 12/06/2008  . BACK PAIN 12/06/2008  . FIBROIDS, UTERUS 11/24/2008  . ESRD (end stage renal disease) on dialysis ([place]) 07/01/2006    Past Medical History: Past Medical History:  Diagnosis Date  . Complication of anesthesia    due to kidney disease  . End stage renal disease on dialysis ([place])   . GERD (gastroesophageal reflux disease)   . Gout     Past Surgical History: Past Surgical History:  Procedure Laterality Date  . AORTIC VALVE REPLACEMENT N/A 02/25/2016   Procedure: AORTIC VALVE REPLACEMENT (AVR) implanted with Magna Ease Aortic valve size 26mm;  Surgeon: Melrose Nakayama, MD;  Location: [place];  Service: Open Heart Surgery;  Laterality: N/A;  . [place] Right 02/17/2016   Procedure: REMOVAL OF TWO ARTERIOVENOUS GORETEX GRAFTS ([place]);  Surgeon: Angelia Mould, MD;  Location: [facility];  Service: Vascular;  Laterality: Right;  . DG AV  DIALYSIS GRAFT DECLOT OR    . DILATATION & CURETTAGE/HYSTEROSCOPY WITH TRUECLEAR N/A 11/06/2012   Procedure: DILATATION & CURETTAGE/HYSTEROSCOPY WITH TRUECLEAR;  Surgeon: Terrance Mass, MD;  Location: [facility];  Service: Gynecology;  Laterality: N/A;  Truclear Resectoscopic Polypectomy   . INSERTION OF DIALYSIS CATHETER N/A 02/19/2016   Procedure: INSERTION OF Left Internal Jugular DIALYSIS CATHETER;  Surgeon: Angelia Mould, MD;  Location: [facility];  Service: Vascular;  Laterality: N/A;  . PATCH ANGIOPLASTY Right 02/17/2016   Procedure: PATCH ANGIOPLASTY;  Surgeon: Angelia Mould, MD;  Location: [facility];  Service: Vascular;  Laterality: Right;   . PERIPHERAL VASCULAR CATHETERIZATION N/A 09/14/2014   Procedure: A/V Shuntogram/Fistulagram;  Surgeon: Katha Cabal, MD;  Location: [facility];  Service: Cardiovascular;  Laterality: N/A;  . PERIPHERAL VASCULAR CATHETERIZATION N/A 09/14/2014   Procedure: A/V Shunt Intervention;  Surgeon: Katha Cabal, MD;  Location: [facility];  Service: Cardiovascular;  Laterality: N/A;  . PERIPHERAL VASCULAR CATHETERIZATION Right 12/09/2014   Procedure: A/V Shuntogram/Fistulagram;  Surgeon: Algernon Huxley, MD;  Location: [place] [facility];  Service: Cardiovascular;  Laterality: Right;  . PERIPHERAL VASCULAR CATHETERIZATION N/A 12/09/2014   Procedure: A/V Shunt Intervention;  Surgeon: Algernon Huxley, MD;  Location: [facility];  Service: Cardiovascular;  Laterality: N/A;  . PERIPHERAL VASCULAR CATHETERIZATION Right 05/24/2015   Procedure: A/V Shuntogram;  Surgeon: Serafina Mitchell, MD;  Location: [place] [facility];  Service: Cardiovascular;  Laterality: Right;  . PERIPHERAL VASCULAR CATHETERIZATION Right 05/24/2015   Procedure: Peripheral Vascular Balloon Angioplasty;  Surgeon: Serafina Mitchell, MD;  Location: [facility];  Service: Cardiovascular;  Laterality: Right;  right arm shunt  . PERIPHERAL VASCULAR CATHETERIZATION N/A 06/13/2015   Procedure: A/V Shuntogram/Fistulagram;  Surgeon: Algernon Huxley, MD;  Location: [place] [facility];  Service: Cardiovascular;  Laterality: N/A;  . PERIPHERAL VASCULAR CATHETERIZATION N/A 06/13/2015   Procedure: A/V Shunt Intervention;  Surgeon: Algernon Huxley, MD;  Location: [place] [facility];  Service: Cardiovascular;  Laterality: N/A;  . TEE WITHOUT CARDIOVERSION N/A 02/22/2016   Procedure: TRANSESOPHAGEAL ECHOCARDIOGRAM (TEE);  Surgeon: Pixie Casino, MD;  Location[facility];  Service: Cardiovascular;  Laterality: N/A;  . TEE WITHOUT CARDIOVERSION N/A 02/25/2016   Procedure: TRANSESOPHAGEAL ECHOCARDIOGRAM (TEE);  Surgeon: Melrose Nakayama, MD;  Location: [facility];  Service: Open Heart Surgery;  Laterality: N/A;  . TUBAL LIGATION  1983    Social History: Social History  Substance Use Topics  . Smoking status: Never Smoker  . Smokeless tobacco: Never Used  . Alcohol use No   Additional social history: lives alone. Denies EtOH, tobacco, or drugs. Daughter and son both help her make medical decisions.  Please also refer to relevant sections of EMR.  Family History: Family History  Problem Relation Age of Onset  . Diabetes Mother   . Hypertension Mother   . Heart disease Mother   . Alcohol abuse Mother   . Kidney disease Mother   . Cancer Father     COLON  . Alcohol abuse Father   . Breast cancer Sister 49  . Hypertension Sister   . Kidney disease Sister   . Hypertension Son     Allergies and Medications: Allergies  Allergen Reactions  . Contrast Media [Iodinated Diagnostic Agents] Anaphylaxis  . Naproxen Sodium Itching   No current facility-administered medications on file prior to encounter.    Current Outpatient Prescriptions on File Prior to  Encounter  Medication Sig Dispense Refill  . aspirin EC 325 MG EC tablet Take 1 tablet (325 mg total) by mouth daily. 30 tablet 0  . cinacalcet (SENSIPAR) 60 MG tablet Take 60 mg by mouth daily.    . famotidine (PEPCID) 20 MG tablet Take 20 mg by mouth daily.     . Nutritional Supplements (NEPRO) LIQD Take 237 mLs by mouth daily.    . sevelamer (RENVELA) 800 MG tablet Take 800 mg by mouth 3 (three) times daily with meals.       Objective: BP (!) 71/51   Pulse 91   Temp 98.3 F (36.8 C)   Resp 17   Ht 5\' 1"  (1.549 m)   Wt 178 lb 9.2 oz (81 kg)   SpO2 100%   BMI 33.74 kg/m  Exam: General: Obese female lying in bed in NAD.  Eyes: EOMI, PEERLA, arcus senilis ENTM: MMM, oropharynx normal Neck: supple, no JVD, tunneled cath visible without surrounding edema, erythema, or tenderness Cardiovascular: RRR, 2/6 murmur best heard at RUSB Respiratory:  CTAB, easy WOB, no wheezes or crackles Gastrointestinal: SNTND, +BS MSK: scars over R bicep, R AC, sutures in place over R AC. Appropriate muscle tone and bulk.  Derm: no rashes or new wounds. Sternotomy scar well healing.  Neuro: CN II-XII grossly intact. Sensation and strength intact over face and all extremities. Strength 4/5 in all extremities, slowed but normal finger-to-nose, reflexes blunted but symmetric Psych: mood and affect appropriate, AOx4  Labs and Imaging: CBC BMET   Recent Labs Lab 05/16/16 1204 05/16/16 1221  WBC 5.5  --   HGB 11.5* 13.6  HCT 36.1 40.0  PLT 180  --     Recent Labs Lab 05/16/16 1204 05/16/16 1221  NA 137 137  K 4.3 4.2  CL 99* 99*  CO2 29  --   BUN <5* <3*  CREATININE 1.66* 1.60*  GLUCOSE 79 78  CALCIUM 8.7*  --      Dg Chest 2 View  Result Date: 05/16/2016 CLINICAL DATA:  Weakness, aphasia EXAM: CHEST  2 VIEW COMPARISON:  05/15/2016 FINDINGS: There is no focal parenchymal opacity. There is no pleural effusion or pneumothorax. The heart and mediastinal contours are unremarkable. There is evidence of prior median sternotomy with aortic valve replacement. There is a dual-lumen left-sided central venous catheter in satisfactory position. Right subclavian stent is noted. The osseous structures are unremarkable. IMPRESSION: No active cardiopulmonary disease. Electronically Signed   By: Kathreen Devoid   On: 05/16/2016 12:54   Dg Chest 2 View  Result Date: 05/15/2016 CLINICAL DATA:  Weakness, shortness of breath with exertion, status post AVR EXAM: CHEST  2 VIEW COMPARISON:  03/02/2016 FINDINGS: Lungs are clear.  No pleural effusion or pneumothorax. Heart is normal in size.  Prosthetic aortic valve. Left dual lumen catheter terminates in the upper right atrium. Bilateral axillary stents. Median sternotomy. IMPRESSION: No evidence of acute cardiopulmonary disease. Postsurgical/postprocedural changes, as above. Electronically Signed   By: Julian Hy  M.D.   On: 05/15/2016 16:18   Ct Head Wo Contrast  Result Date: 05/16/2016 CLINICAL DATA:  Patient riding in car when suddenly unable to speak with LEFT arm numbness. Speech arrest has improved. EXAM: CT HEAD WITHOUT CONTRAST TECHNIQUE: Contiguous axial images were obtained from the base of the skull through the vertex without intravenous contrast. COMPARISON:  MR brain 02/15/2016. FINDINGS: Brain: No evidence for acute infarction, hemorrhage, mass lesion, hydrocephalus, or extra-axial fluid. Generalized atrophy. Extensive hypoattenuation of white  matter correlating with chronic microvascular ischemic change noted on MR. No CT signs of acute cortical infarction. Vascular: Mild vascular calcification in the skull base carotid arteries, without signs of large vessel occlusion. Skull: Normal. Negative for fracture or focal lesion. Sinuses/Orbits: No acute finding. Other: Trace layering fluid RIGHT mastoid. No nasopharyngeal process. IMPRESSION: Negative for acute cortical infarction or proximal large vessel occlusion. Atrophy and small vessel disease appears similar to priors. Electronically Signed   By: Staci Righter M.D.   On: 05/16/2016 12:40    Sela Hilding, MD 05/16/2016, 3:11 PM PGY-1, [place] Intern pager: [phone], text pages welcome  FPTS Upper-Level Resident Addendum  I have independently interviewed and examined the patient. I have discussed the above with the original author and agree with their documentation. My edits for correction/addition/clarification are in blue. Please see also any attending notes.   Hyman Bible, MD PGY-2, [place] Service pager: [phone] (text pages welcome through [facility])

## 2016-05-17 ENCOUNTER — Observation Stay (HOSPITAL_COMMUNITY): Payer: Medicare Other

## 2016-05-17 ENCOUNTER — Ambulatory Visit: Payer: Medicare Other | Admitting: Cardiology

## 2016-05-17 ENCOUNTER — Other Ambulatory Visit: Payer: Self-pay

## 2016-05-17 DIAGNOSIS — R4702 Dysphasia: Secondary | ICD-10-CM | POA: Diagnosis not present

## 2016-05-17 DIAGNOSIS — Z992 Dependence on renal dialysis: Secondary | ICD-10-CM | POA: Diagnosis not present

## 2016-05-17 DIAGNOSIS — I679 Cerebrovascular disease, unspecified: Secondary | ICD-10-CM | POA: Diagnosis not present

## 2016-05-17 DIAGNOSIS — N186 End stage renal disease: Secondary | ICD-10-CM | POA: Diagnosis not present

## 2016-05-17 DIAGNOSIS — R4701 Aphasia: Secondary | ICD-10-CM | POA: Diagnosis not present

## 2016-05-17 DIAGNOSIS — R4789 Other speech disturbances: Secondary | ICD-10-CM | POA: Diagnosis not present

## 2016-05-17 DIAGNOSIS — G458 Other transient cerebral ischemic attacks and related syndromes: Secondary | ICD-10-CM | POA: Diagnosis not present

## 2016-05-17 DIAGNOSIS — T82898A Other specified complication of vascular prosthetic devices, implants and grafts, initial encounter: Secondary | ICD-10-CM | POA: Diagnosis not present

## 2016-05-17 DIAGNOSIS — Z01810 Encounter for preprocedural cardiovascular examination: Secondary | ICD-10-CM

## 2016-05-17 DIAGNOSIS — G459 Transient cerebral ischemic attack, unspecified: Secondary | ICD-10-CM | POA: Diagnosis not present

## 2016-05-17 LAB — RENAL FUNCTION PANEL
ANION GAP: 11 (ref 5–15)
Albumin: 2.5 g/dL — ABNORMAL LOW (ref 3.5–5.0)
BUN: <5 mg/dL — ABNORMAL LOW (ref 6–20)
CALCIUM: 10 mg/dL (ref 8.9–10.3)
CO2: 25 mmol/L (ref 22–32)
Chloride: 100 mmol/L — ABNORMAL LOW (ref 101–111)
Creatinine, Ser: 2.85 mg/dL — ABNORMAL HIGH (ref 0.44–1.00)
GFR, EST AFRICAN AMERICAN: 19 mL/min — AB (ref >60)
GFR, EST NON AFRICAN AMERICAN: 17 mL/min — AB (ref >60)
Glucose, Bld: 78 mg/dL (ref 65–99)
PHOSPHORUS: 3.2 mg/dL (ref 2.5–4.6)
Potassium: 5.5 mmol/L — ABNORMAL HIGH (ref 3.5–5.1)
SODIUM: 136 mmol/L (ref 135–145)

## 2016-05-17 LAB — LIPID PANEL
Cholesterol: 159 mg/dL (ref 0–200)
HDL: 72 mg/dL (ref >40)
LDL Cholesterol: 73 mg/dL (ref 0–99)
TRIGLYCERIDES: 70 mg/dL (ref <150)
Total CHOL/HDL Ratio: 2.2 RATIO
VLDL: 14 mg/dL (ref 0–40)

## 2016-05-17 LAB — CBC
HCT: 38.6 % (ref 36.0–46.0)
Hemoglobin: 12 g/dL (ref 12.0–15.0)
MCH: 28.2 pg (ref 26.0–34.0)
MCHC: 31.1 g/dL (ref 30.0–36.0)
MCV: 90.6 fL (ref 78.0–100.0)
PLATELETS: 178 10*3/uL (ref 150–400)
RBC: 4.26 MIL/uL (ref 3.87–5.11)
RDW: 17.2 % — AB (ref 11.5–15.5)
WBC: 5.9 10*3/uL (ref 4.0–10.5)

## 2016-05-17 LAB — ECHOCARDIOGRAM COMPLETE
Height: 60 in
WEIGHTICAEL: 2860.8 [oz_av]

## 2016-05-17 LAB — CORTISOL-AM, BLOOD: CORTISOL - AM: 10.8 ug/dL (ref 6.7–22.6)

## 2016-05-17 LAB — TSH: TSH: 2.809 u[IU]/mL (ref 0.350–4.500)

## 2016-05-17 MED ORDER — PERFLUTREN LIPID MICROSPHERE
1.0000 mL | INTRAVENOUS | Status: AC | PRN
  Administered 2016-05-17: 2 mL via INTRAVENOUS
  Filled 2016-05-17: qty 10

## 2016-05-17 MED ORDER — PERFLUTREN LIPID MICROSPHERE
INTRAVENOUS | Status: AC
  Administered 2016-05-17: 2 mL via INTRAVENOUS
  Filled 2016-05-17: qty 10

## 2016-05-17 MED ORDER — SODIUM POLYSTYRENE SULFONATE 15 GM/60ML PO SUSP
30.0000 g | Freq: Once | ORAL | Status: AC
  Administered 2016-05-17: 30 g via ORAL
  Filled 2016-05-17: qty 120

## 2016-05-17 MED ORDER — MEGESTROL ACETATE 40 MG PO TABS: 40.0000 mg | ORAL_TABLET | Freq: Every day | ORAL | 0 refills | Status: DC

## 2016-05-17 MED ORDER — MIDODRINE HCL 10 MG PO TABS: 10.0000 mg | ORAL_TABLET | Freq: Two times a day (BID) | ORAL | 0 refills | Status: DC | PRN

## 2016-05-17 NOTE — Progress Notes (Signed)
   05/16/16 1420  Step #1 Pre-Swallow Screen  History of dysphagia No  Dysphagia diet prior to admission No  Awake and alert, responding to speech? Yes  Positioned upright, with some head control? Yes  Cough on command? Yes  Maintain control of their saliva? Yes  Lick top and bottom lip? Yes  Breathe freely and maintain O2 sats? Yes  Voice quality clear (No "wet" gurgly or hoarse sounds) Yes  R Upper  Breath Sounds Clear  L Upper Breath Sounds Clear  R Lower Breath Sounds Clear  L Lower Breath Sounds Clear  Temp 98.3 F (36.8 C)  Step #2 Swallow Screen  Patient was kept strictly NPO prior to this screen Yes  Water via cup No problems  Water via straw No problems  Cracker No problems  Lung sounds changed after swallow screen No  Passed swallow screen Yes, see row information   Patient stroke swallow screen completed in ED. Per Dr. Nicole Kindred patient may have sips with meds until fasting lipid panel collected. Patient administered medication whole with h2o with no difficulties. RN will continue to monitor patient.

## 2016-05-17 NOTE — Progress Notes (Signed)
Neurology sign off note  Subjective:  No complaints   Vitals:   05/17/16 0600 05/17/16 0830  BP: 116/66 96/62  Pulse: 86 81  Resp: 20 20  Temp:  98.8 F (37.1 C)   Gen: In bed, NAD Resp: non-labored breathing, no acute distress Abd: soft, nt   General: NAD Mental Status: Alert, oriented, thought content appropriate.  Speech fluent without evidence of aphasia.  Able to follow 3 step commands without difficulty. Cranial Nerves: II:  Visual fields grossly normal,  III,IV, VI: ptosis not present, extra-ocular motions intact bilaterally, pupils equal, round, reactive to light and accommodation V,VII: smile symmetric, facial light touch sensation normal bilaterally VIII: hearing normal bilaterally IX,X: uvula rises symmetrically XI: bilateral shoulder shrug XII: midline tongue extension Motor: Patient is weak throughout. Her upper body strength is approximately 4/5 with her lower body strength being 4 minus/5. Patient states that she is always had weak lower extremity strength. 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 Deep Tendon Reflexes: 2+ and symmetric throughout upper extremities 1+ at the knee jerks no ankle jerks and downgoing toes Plantars: Right: downgoing                                Left: downgoing Cerebellar: normal finger-to-nose,    Pertinent Labs: K 5.5  Brief summary of case/Impression: In brief, this is a 61 year old woman who had an episode of speech arrest following dialysis today. This lasted for about 15 minutes and then resolved. On arrival in the ED she was noted to be markedly hypotensive with systolic blood pressure in the 70s. She received IV fluid resuscitation with rapid improvement in symptoms.  Neurological Diagnoses: 1) transient speech difficulties in setting hypo perfusion  Recommendations: 1) no further neurological testing. Keep BP WNL during and  after HD.  2) Neurology to sign off at this time. Please call with any further questions or concerns.

## 2016-05-17 NOTE — Care Management Obs Status (Signed)
[facility] NOTIFICATION   Patient Details  Name: [name] Marks MRN: [id number] Date of Birth: Jun 27, 1955   Medicare Observation Status Notification Given:  Yes    Pollie Friar, RN 05/17/2016, 4:04 PM

## 2016-05-17 NOTE — Progress Notes (Signed)
Occupational Therapy Evaluation Patient Details Name: [name] Marks MRN: [id number] DOB: 05/31/1955 Today's Date: 05/17/2016    History of Present Illness Pt is a 61 y.o. F admitted with stroke like symptoms after dialysis on 05/16/16. 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 Pt states " I do whatever I need to do on my own". Pt demonstrates apparent cognitive deficits, but unsure if this is most likley her baseline level. Recommend initial S with all mobility and ADL and HHOT, [place]. Discussed with Case management.  Will follow acutely to address established goals and facilitate safe DC home.     Follow Up Recommendations  Home health OT;Other (comment) (S for mobility and ADL initially)    Equipment Recommendations  None recommended by OT    Recommendations for Other Services       Precautions / Restrictions Precautions Precautions: Fall Restrictions Weight Bearing Restrictions: No      Mobility Bed Mobility Overal bed mobility: Needs Assistance;Modified Independent Bed Mobility: Sit to Sidelying         Sit to sidelying: Min assist General bed mobility comments: moves slowly  Transfers Overall transfer level: Needs assistance Equipment used: Rolling walker (2 wheeled) Transfers: Sit to/from Stand Sit to Stand: Supervision (for safety )              Balance Overall balance assessment: History of Falls;Needs assistance Sitting-balance support: No upper extremity supported;Feet supported Sitting balance-Leahy Scale: Good     Standing balance support: Bilateral upper extremity supported;During functional activity Standing balance-Leahy Scale: Fair Standing balance comment: able to release hands from RW to complete grooming at sink                            ADL  Overall ADL's : Needs assistance/impaired     Grooming: Set up   Upper Body Bathing: Set up;Sitting   Lower Body Bathing: Set up;Sit to/from stand   Upper Body Dressing : Set up;Sitting   Lower Body Dressing: Set up;Supervision/safety;Sit to/from stand   Toilet Transfer: Supervision/safety;Ambulation   Toileting- Clothing Manipulation and Hygiene: Modified independent       Functional mobility during ADLs: Supervision/safety;Rolling walker;Cueing for safety       Vision         Perception     Praxis      Pertinent Vitals/Pain Pain Assessment: Faces Faces Pain Scale: Hurts a little bit Pain Location: B feet Pain Descriptors / Indicators: Aching Pain Intervention(s): Limited activity within patient's tolerance     Hand Dominance Left   Extremity/Trunk Assessment Upper Extremity Assessment Upper Extremity Assessment: Generalized weakness   Lower Extremity Assessment Lower Extremity Assessment: Defer to PT evaluation RLE Deficits / Details: Hip Flexion 3+/5, Knee Extension 4+/5, Knee flexion 4/5, DF 4/5.  RLE Sensation: history of peripheral neuropathy LLE Deficits / Details: Hip Flexion 3+/5, Knee Extension 4+/5, Knee flexion 4/5, DF 4/5.  LLE Sensation: history of peripheral neuropathy   Cervical / Trunk Assessment Cervical / Trunk Assessment: Kyphotic   Communication Communication Communication: No difficulties   Cognition Arousal/Alertness: Awake/alert Behavior During Therapy: WFL for tasks assessed/performed Overall Cognitive Status: No family/caregiver present to determine baseline cognitive functioning Area of Impairment: Attention;Following commands;Safety/judgement;Awareness   Current Attention Level: Sustained   Following Commands: Follows  one step commands consistently Safety/Judgement: Decreased awareness of deficits Awareness: Emergent   General Comments: Pt forgetting to use RW when ambulating in room. Pt states she does "furniture walk"  at home   General Comments  pt reports recent fall less tahn 6 months ago     Exercises       Shoulder Instructions      Home Living Family/patient expects to be discharged to:: Private residence Living Arrangements: Alone Available Help at Discharge: Available PRN/intermittently;Family (son and daughter) Type of Home: House Home Access: Stairs to enter (5) Entrance Stairs-Number of Steps: 5 Entrance Stairs-Rails: Can reach both;Right;Left Home Layout: One level     Bathroom Shower/Tub: Tub/shower unit (grab bars )   Bathroom Toilet: Standard (grab bars) Bathroom Accessibility: Yes How Accessible: Accessible via walker Home Equipment: [place] - 2 wheels;Cane - single point;Grab bars - toilet;Grab bars - tub/shower;Wheelchair - manual   Additional Comments: Pt states that she uses walker to go up and down the stair but does not use it around the house or for community ambulation      Prior Functioning/Environment Level of Independence: Independent with assistive device(s)        Comments: does not drive; son drives her dialysis        OT Problem List: Decreased activity tolerance;Impaired balance (sitting and/or standing);Decreased knowledge of use of DME or AE;Obesity      OT Treatment/Interventions: Self-care/ADL training;Therapeutic exercise;Energy conservation;DME and/or AE instruction;Therapeutic activities;Patient/family education;Balance training    OT Goals(Current goals can be found in the care plan section) Acute Rehab OT Goals Patient Stated Goal: to retrun home  OT Goal Formulation: With patient Time For Goal Achievement: 05/31/16 Potential to Achieve Goals: Good ADL Goals Pt Will Perform Lower Body Bathing: with modified independence;sit to/from stand Pt Will Perform Lower Body Dressing: with modified independence;sit to/from stand Additional ADL Goal #1: mod I  with item transport @ RW level.  OT Frequency: Min 2X/week   Barriers to D/C:             Co-evaluation              End of Session Equipment Utilized During Treatment: Gait belt;Rolling walker Nurse Communication: Mobility status  Activity Tolerance: Patient tolerated treatment well Patient left: in chair;with call bell/phone within reach;with chair alarm set  OT Visit Diagnosis: Other abnormalities of gait and mobility (R26.89);Muscle weakness (generalized) (M62.81)                ADL either performed or assessed with clinical judgement  Time: 5621-3086 OT Time Calculation (min): 20 min Charges:  OT General Charges $OT Visit: 1 Procedure OT Evaluation $OT Eval Moderate Complexity: 1 Procedure G-Codes: OT G-codes **NOT FOR INPATIENT CLASS** Functional Assessment Tool Used: Clinical judgement Functional Limitation: Self care Self Care Current Status (V7846): At least 1 percent but less than 20 percent impaired, limited or restricted Self Care Goal Status (N6295): At least 1 percent but less than 20 percent impaired, limited or restricted   [facility], OT/L  [phone] 05/17/2016  Carliss Quast,HILLARY 05/17/2016, 3:25 PM

## 2016-05-17 NOTE — Care Management Note (Signed)
Case Management Note  Patient Details  Name: [name] Marks MRN: [id number] Date of Birth: 09/17/1955  Subjective/Objective:                    Action/Plan: Pt discharging home with orders for [facility] services. CM met with the patient and provided her a list of [facility] agencies. She selected [facility]. [name] Marks with [facility] notified and accepted the referral.  Pt with orders for walker. Pt states she has a walker at home.  Pt attempting to reach son for transportation home without success. CM called and spoke to pts son [name] Marks and he will be up to get patient this pm.   Expected Discharge Date:                  Expected Discharge Plan:  [place]  In-House Referral:     Discharge planning Services  CM Consult  Post Acute Care Choice:  Home Health, Durable Medical Equipment (pt states she has a walker at home) Choice offered to:  Patient  DME Arranged:    DME Agency:     HH Arranged:  PT, OT, Nurse's Aide, Social Work [employer] Agency:  [place]  Status of Service:  Completed, signed off  If discussed at [employer], dates discussed:    Additional Comments:  Pollie Friar, RN 05/17/2016, 4:05 PM

## 2016-05-17 NOTE — Progress Notes (Signed)
Stopped by to visit with pt, but she was having PT. Let nurse know; will try again later.    05/17/16 1000  Clinical Encounter Type  Visited With Patient not available  Visit Type Initial;Psychological support;Spiritual support;Social support  Referral From Nurse   Gerrit Heck, Chaplain

## 2016-05-17 NOTE — Progress Notes (Signed)
Stopped back by to see if pt wished any further discussion on advanced direction form or her situation, but she was sitting in chair, happily waiting to be discharged. She thanked me, and will take advanced directive form to complete elsewhere.    05/17/16 1500  Clinical Encounter Type  Visited With Patient  Visit Type Follow-up;Psychological support;Spiritual support;Social support  Referral From Chaplain  Spiritual Encounters  Spiritual Needs Brochure;Emotional  Stress Factors  Patient Stress Factors Health changes;Loss of control   Gerrit Heck, Chaplain

## 2016-05-17 NOTE — Evaluation (Signed)
Physical Therapy Evaluation Patient Details Name: [name] Marks MRN: [id number] DOB: Nov 19, 1955 Today's Date: 05/17/2016   History of Present Illness  Pt is a 61 y.o. F admitted with stroke like symptoms after dialysis on 05/16/16. 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 During strength and coordination testing pt required multiple attempts and explanations to complete tasks therefore difficulty with accurate assessment. Pt ambulated with min A 2/2 required verbal and tactile cues for guiding walker and decreased weight acceptance on R LE. Pt required min A for descending stairs to control decent and maintain safety. Contacted resident to express concerns regarding pt cognitive function and ability to follow instructions also attempted to contact pt's family to obtain baseline measure of cognitive function. Recommend continued Acute PT services to address above impairments. Recommend 24/7 supervision if available at d/c with HHPT to address increased fall risk and safety concerns if family unable to provide 24/7 assist may need STSNF prior to d/c home.   Follow Up Recommendations Supervision/Assistance - 24 hour;Home health PT    Equipment Recommendations  Rolling walker with 5" wheels    Recommendations for Other Services       Precautions / Restrictions Precautions Precautions: Fall Restrictions Weight Bearing Restrictions: No      Mobility  Bed Mobility Overal bed mobility: Needs Assistance Bed Mobility: Sit to Sidelying         Sit to sidelying: Min assist General bed mobility comments: needed assistance with bringing one leg into bed and required queing for repositioning in bed.   Transfers Overall transfer  level: Needs assistance Equipment used: Rolling walker (2 wheeled) Transfers: Sit to/from Stand Sit to Stand: Min guard (for safety )            Ambulation/Gait Ambulation/Gait assistance: Min assist Ambulation Distance (Feet): 120 Feet Assistive device: Rolling walker (2 wheeled) Gait Pattern/deviations: Decreased step length - right;Decreased step length - left;Decreased stride length;Decreased stance time - right;Drifts right/left Gait velocity: overall decreased  Gait velocity interpretation: Below normal speed for age/gender General Gait Details: During ambulation pt consistently drifts to the R with multiple verbal and tactile cues required for keeping the walker straight. Weight shift to the R causes the leg to buckle. Delayed activation of flexors during swing phase causing overall decrease in gait speed.  Stairs Stairs: Yes Stairs assistance: Min assist Stair Management: Two rails Number of Stairs: 2 General stair comments: Pt was able to ambulate up the stairs with CGA and the use of B rails. Ambulating down the stairs patient required min assist to control decent and for safety.   Wheelchair Mobility    Modified Rankin (Stroke Patients Only) Modified Rankin (Stroke Patients Only) Pre-Morbid Rankin Score: No significant disability Modified Rankin: Moderately severe disability     Balance Overall balance assessment: History of Falls;Needs assistance Sitting-balance support: No upper extremity supported;Feet supported Sitting balance-Leahy Scale: Good     Standing balance support: Bilateral upper extremity supported;During functional activity Standing balance-Leahy Scale: Fair                               Pertinent Vitals/Pain Pain Assessment: No/denies pain    Home Living Family/patient expects to be discharged to:: Private residence Living Arrangements: Alone Available  Help at Discharge: Available PRN/intermittently;Family (son and  daughter) Type of Home: House Home Access: Stairs to enter (5) Entrance Stairs-Rails: Can reach both;Right;Left Entrance Stairs-Number of Steps: 5 Home Layout: One level Home Equipment: Walker - 2 wheels;Cane - single point;Grab bars - toilet;Grab bars - tub/shower;Wheelchair - manual Additional Comments: Pt states that she uses walker to go up and down the stair but does not use it around the house or for community ambulation    Prior Function Level of Independence: Independent with assistive device(s)         Comments: does not drive      Hand Dominance   Dominant Hand: Left    Extremity/Trunk Assessment   Upper Extremity Assessment Upper Extremity Assessment: Defer to OT evaluation    Lower Extremity Assessment Lower Extremity Assessment: RLE deficits/detail;LLE deficits/detail (Pt displayed difficulty following commands for strength and coordination testing. Therefore difficult to get accurate assessment of strength and coordination. ) RLE Deficits / Details: Hip Flexion 3+/5, Knee Extension 4+/5, Knee flexion 4/5, DF 4/5.  RLE Sensation: history of peripheral neuropathy LLE Deficits / Details: Hip Flexion 3+/5, Knee Extension 4+/5, Knee flexion 4/5, DF 4/5.  LLE Sensation: history of peripheral neuropathy    Cervical / Trunk Assessment Cervical / Trunk Assessment: Kyphotic  Communication   Communication: No difficulties  Cognition Arousal/Alertness: Awake/alert Behavior During Therapy: WFL for tasks assessed/performed Overall Cognitive Status: No family/caregiver present to determine baseline cognitive functioning Area of Impairment: Attention;Following commands   Current Attention Level: Sustained   Following Commands: Follows one step commands inconsistently;Follows multi-step commands inconsistently       General Comments: Pt displayed with attention deficits and inability to follow commands for strength testing and coordination testing after multiple  attempts.     General Comments General comments (skin integrity, edema, etc.): pt reports recent fall less tahn 6 months ago     Exercises     Assessment/Plan    PT Assessment Patient needs continued PT services  PT Problem List Decreased strength;Decreased activity tolerance;Decreased balance;Decreased coordination;Decreased knowledge of use of DME;Decreased safety awareness;Decreased cognition       PT Treatment Interventions DME instruction;Gait training;Stair training;Balance training;Therapeutic activities    PT Goals (Current goals can be found in the Care Plan section)  Acute Rehab PT Goals Patient Stated Goal: to retrun home  PT Goal Formulation: With patient Time For Goal Achievement: 05/24/16 Potential to Achieve Goals: Fair    Frequency Min 3X/week   Barriers to discharge Decreased caregiver support      Co-evaluation               End of Session Equipment Utilized During Treatment: Gait belt Activity Tolerance: Patient tolerated treatment well Patient left: in bed;with call bell/phone within reach   PT Visit Diagnosis: Other abnormalities of gait and mobility (R26.89);History of falling (Z91.81)    Functional Assessment Tool Used: AM-PAC 6 Clicks Basic Mobility Functional Limitation: Mobility: Walking and moving around Mobility: Walking and Moving Around Current Status (V9563): At least 40 percent but less than 60 percent impaired, limited or restricted Mobility: Walking and Moving Around Goal Status [phone]): At least 20 percent but less than 40 percent impaired, limited or restricted    Time: 1015-1043 PT Time Calculation (min) (ACUTE ONLY): 28 min   Charges:   PT Evaluation $PT Eval Moderate Complexity: 1 Procedure PT Treatments $Gait Training: 8-22 mins   PT G Codes:   PT G-Codes **NOT FOR INPATIENT CLASS** Functional Assessment Tool Used: AM-PAC 6 Clicks  Basic Mobility Functional Limitation: Mobility: Walking and moving around Mobility:  Walking and Moving Around Current Status [phone]): At least 40 percent but less than 60 percent impaired, limited or restricted Mobility: Walking and Moving Around Goal Status [phone]): At least 20 percent but less than 40 percent impaired, limited or restricted    Glee Arvin, SPT 05/17/2016

## 2016-05-17 NOTE — Discharge Summary (Signed)
[facility] Discharge Summary  Patient name: [name] Marks Medical record number: [id number] Date of birth: 03-26-1955 Age: 61 y.o. Gender: female Date of Admission: 05/16/2016  Date of Discharge: 05/17/16  Admitting Physician: Leeanne Rio, MD  Primary Care Provider: Lockie Pares, MD Consultants: neurology  Indication for Hospitalization: aphasia and LUE numbness, resolved on presentation  Discharge Diagnoses/Problem List:  TIA  ESRD on MWF HD Chronic Hypotension Hx of aortic bioprosthetic valve Complex atypical endometrial hyperplasia  Disposition: home  Discharge Condition: stable  Discharge Exam: see progress note from day of discharge  Brief Hospital Course:  Patient presented with sudden onset of a fascia and left upper extremity numbness immediately following dialysis. Patient was in the car with her son when this occurred, he confirms that she was unable to speak and she reports that it felt like something was in her mouth. On presentation to the ED, the symptoms had resolved. CT head was conducted without any acute findings, neurology was consulted who recommended brain MRI, which did not show any stroke. Etiology thought to be secondary to chronic hypotension which is acutely worsened by dialysis causing cerebral hypoperfusion. MRI brain did incidentally find bilateral mastoid effusions. This was discussed with radiology, who confirm that this is a frequent finding and does not need further investigation if the patient is not endorsing any infectious symptoms. On admission, Megace was held, which is used for her complex atypical endometrial hyperplasia, given that this medicine can increase the risk of clot. Blood cultures were obtained as any bacteremia could also be a source of embolism. Home Sensipar and Renvela were continued during admission, nephrology was notified of her admission on the day of discharge. Potassium of 5.5 was  discussed with nephrology who recommended a one-time dose of Kayexalate and continued scheduled dialysis on 3/22.  Issues for Follow Up:  1. May need ENT follow up for bilateral mastoid effusions. On exam, no tenderness, fever, or signs of mastoiditis. Otoscopic exam WNL. 2. Continued nephrology follow up of hypotension - discussed with Dr. Jonnie Finner who recommended midodrine to be used 78min prior to HD. 3. Needs GYN follow up - GYN Dr. Uvaldo Rising is following her complex endometrial hyperplasia last seen 11/08/15. He was managing her Megace and was considering Mirena.  4. Follow up blood cultures - patient with history of endocarditis in 01/2016, no fever or infectious symptoms on presentation. Ordered out of caution as bacteremia could see embolus although MRI brain negative.  Significant Procedures: CT head, MRI brain  Significant Labs and Imaging:   Recent Labs Lab 05/16/16 1204 05/16/16 1221 05/17/16 0919  WBC 5.5  --  5.9  HGB 11.5* 13.6 12.0  HCT 36.1 40.0 38.6  PLT 180  --  178    Recent Labs Lab 05/16/16 1204 05/16/16 1221 05/17/16 0549  NA 137 137 136  K 4.3 4.2 5.5*  CL 99* 99* 100*  CO2 29  --  25  GLUCOSE 79 78 78  BUN <5* <3* <5*  CREATININE 1.66* 1.60* 2.85*  CALCIUM 8.7*  --  10.0  PHOS  --   --  3.2  ALKPHOS 176*  --   --   AST 26  --   --   ALT 13*  --   --   ALBUMIN 2.6*  --  2.5*    Results/Tests Pending at Time of Discharge: blood cultures  Discharge Medications:  Allergies as of 05/17/2016      Reactions  Contrast Media [iodinated Diagnostic Agents] Anaphylaxis   Naproxen Sodium Itching      Medication List    TAKE these medications   aspirin 325 MG EC tablet Take 1 tablet (325 mg total) by mouth daily.   cinacalcet 60 MG tablet Commonly known as:  SENSIPAR Take 60 mg by mouth daily.   famotidine 20 MG tablet Commonly known as:  PEPCID Take 20 mg by mouth daily.   megestrol 40 MG tablet Commonly known as:  MEGACE Take 1  tablet (40 mg total) by mouth daily. What changed:  when to take this  reasons to take this   midodrine 10 MG tablet Commonly known as:  PROAMATINE Take 1 tablet (10 mg total) by mouth 2 (two) times daily as needed (30 min prior to HD).   NEPRO Liqd Take 237 mLs by mouth daily.   RENVELA 800 MG tablet Generic drug:  sevelamer carbonate Take 800 mg by mouth 3 (three) times daily with meals.            Durable Medical Equipment        Start     Ordered   05/17/16 1527  For home use only DME Walker wide  Once    Question:  Patient needs a walker to treat with the following condition  Answer:  Physical deconditioning   05/17/16 1526      Discharge Instructions: Please refer to Patient Instructions section of EMR for full details.  Patient was counseled important signs and symptoms that should prompt return to medical care, changes in medications, dietary instructions, activity restrictions, and follow up appointments.   Follow-Up Appointments: Follow-up Information    Adin Hector, MD. Go on 05/23/2016.   Specialty:  Family Medicine Why:  9:45am for hospital follow up Contact information: [place] [address] [phone]           Sela Hilding, MD 05/17/2016, 4:36 PM PGY-1, [place]

## 2016-05-17 NOTE — Care Management CC44 (Signed)
Condition Code 44 Documentation Completed  Patient Details  Name: [name] Marks MRN: [id number] Date of Birth: 05-07-1955   Condition Code 44 given:  Yes Patient signature on Condition Code 44 notice:  Yes Documentation of 2 MD's agreement:  Yes Code 44 added to claim:  Yes    Pollie Friar, RN 05/17/2016, 4:04 PM

## 2016-05-17 NOTE — Progress Notes (Signed)
Per lab, phlebotomist unable to complete collection of blood this AM, will need to redraw.   Ave Filter, RN

## 2016-05-17 NOTE — Progress Notes (Signed)
RN spoke with lab technician. CBC blood sample requires recollection, order re-entered and phlebotomist contacted by lab technician. Assigned RN notified.

## 2016-05-17 NOTE — Progress Notes (Signed)
SLP Cancellation Note  Patient Details Name: [name] Marks MRN: [id number] DOB: December 31, 1955   Cancelled treatment:       Reason Eval/Treat Not Completed: SLP screened, no needs identified, will sign off   Juan Quam [place] 05/17/2016, 11:31 AM

## 2016-05-17 NOTE — Consult Note (Signed)
Hospital Consult    Reason for Consult:  Suture removal and new access Referring Physician:  [name] MRN #:  [id number]  History of Present Illness: This is a 61 y.o. female who previously had a right upper arm AVG, which was removed on 02/17/16 also with bovine pericardial patch angioplasty of right brachial artery by Dr. Scot Dock for infection.  Subsequently she had a [place] placed on 02/19/16.  She was seen on 02/29/16 at which time her sutures were removed and incisions were healed.  She presented to our office on 05/03/16 requesting sutures be removed. She could not stay and had follow up appointment with Dr. Scot Dock on 05/16/16 for new access and said she would be back then.    Yesterday after HD, she had a sudden onset of aphasia and LUE weakness that lasted about 15 minutes.  She presented to the emergency room but her sx were resolved.  In the ED, she was noted to be hypotensive with BP of 75/35 and was given fluid bolus.  MRI revealed no acute brain parenchymal finding.   She has been evaluated by neurology and no further neurological testing and recommended to keep BP WNL after HD.  Blood cx were drawn yesterday and no growth in < 12 hours.  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    Past Medical History:  Diagnosis Date  . Complication of anesthesia    due to kidney disease  . End stage renal disease on dialysis ([place])   . GERD (gastroesophageal reflux disease)   . Gout     Past Surgical History:  Procedure Laterality Date  . AORTIC VALVE REPLACEMENT N/A 02/25/2016   Procedure: AORTIC VALVE REPLACEMENT (AVR) implanted with Magna Ease Aortic valve size 68mm;  Surgeon: Melrose Nakayama, MD;  Location: [facility];  Service: Open Heart Surgery;  Laterality: N/A;  . [place] Right 02/17/2016   Procedure: REMOVAL OF TWO ARTERIOVENOUS GORETEX GRAFTS ([place]);  Surgeon: Angelia Mould, MD;  Location: [facility]  OR;  Service: Vascular;  Laterality: Right;  . DG AV DIALYSIS GRAFT DECLOT OR    . DILATATION & CURETTAGE/HYSTEROSCOPY WITH TRUECLEAR N/A 11/06/2012   Procedure: DILATATION & CURETTAGE/HYSTEROSCOPY WITH TRUECLEAR;  Surgeon: Terrance Mass, MD;  Location: [facility];  Service: Gynecology;  Laterality: N/A;  Truclear Resectoscopic Polypectomy   . INSERTION OF DIALYSIS CATHETER N/A 02/19/2016   Procedure: INSERTION OF Left Internal Jugular DIALYSIS CATHETER;  Surgeon: Angelia Mould, MD;  Location: [facility];  Service: Vascular;  Laterality: N/A;  . PATCH ANGIOPLASTY Right 02/17/2016   Procedure: PATCH ANGIOPLASTY;  Surgeon: Angelia Mould, MD;  Location: [facility];  Service: Vascular;  Laterality: Right;  . PERIPHERAL VASCULAR CATHETERIZATION N/A 09/14/2014   Procedure: A/V Shuntogram/Fistulagram;  Surgeon: Katha Cabal, MD;  Location: [place] [facility];  Service: Cardiovascular;  Laterality: N/A;  . PERIPHERAL VASCULAR CATHETERIZATION N/A 09/14/2014   Procedure: A/V Shunt Intervention;  Surgeon: Katha Cabal, MD;  Location: [facility];  Service: Cardiovascular;  Laterality: N/A;  . PERIPHERAL VASCULAR CATHETERIZATION Right 12/09/2014   Procedure: A/V Shuntogram/Fistulagram;  Surgeon: Algernon Huxley, MD;  Location: [facility];  Service: Cardiovascular;  Laterality: Right;  . PERIPHERAL VASCULAR CATHETERIZATION N/A 12/09/2014   Procedure: A/V Shunt Intervention;  Surgeon: Algernon Huxley, MD;  Location: [place] [facility] LAB;  Service: Cardiovascular;  Laterality: N/A;  . PERIPHERAL VASCULAR CATHETERIZATION Right 05/24/2015  Procedure: A/V Shuntogram;  Surgeon: Serafina Mitchell, MD;  Location: [place] [facility];  Service: Cardiovascular;  Laterality: Right;  . PERIPHERAL VASCULAR CATHETERIZATION Right 05/24/2015   Procedure: Peripheral Vascular Balloon Angioplasty;  Surgeon: Serafina Mitchell, MD;  Location: [place] [facility];  Service: Cardiovascular;  Laterality: Right;   right arm shunt  . PERIPHERAL VASCULAR CATHETERIZATION N/A 06/13/2015   Procedure: A/V Shuntogram/Fistulagram;  Surgeon: Algernon Huxley, MD;  Location: [facility];  Service: Cardiovascular;  Laterality: N/A;  . PERIPHERAL VASCULAR CATHETERIZATION N/A 06/13/2015   Procedure: A/V Shunt Intervention;  Surgeon: Algernon Huxley, MD;  Location: [place] [facility];  Service: Cardiovascular;  Laterality: N/A;  . TEE WITHOUT CARDIOVERSION N/A 02/22/2016   Procedure: TRANSESOPHAGEAL ECHOCARDIOGRAM (TEE);  Surgeon: Pixie Casino, MD;  Location: [facility];  Service: Cardiovascular;  Laterality: N/A;  . TEE WITHOUT CARDIOVERSION N/A 02/25/2016   Procedure: TRANSESOPHAGEAL ECHOCARDIOGRAM (TEE);  Surgeon: Melrose Nakayama, MD;  Location: [facility];  Service: Open Heart Surgery;  Laterality: N/A;  . TUBAL LIGATION  1983    Allergies  Allergen Reactions  . Contrast Media [Iodinated Diagnostic Agents] Anaphylaxis  . Naproxen Sodium Itching    Prior to Admission medications   Medication Sig Start Date End Date Taking? Authorizing Provider  aspirin EC 325 MG EC tablet Take 1 tablet (325 mg total) by mouth daily. 03/06/16  Yes Erin R Barrett, PA-C  cinacalcet (SENSIPAR) 60 MG tablet Take 60 mg by mouth daily.   Yes Historical Provider, MD  famotidine (PEPCID) 20 MG tablet Take 20 mg by mouth daily.    Yes Historical Provider, MD  megestrol (MEGACE) 40 MG tablet Take 40 mg by mouth daily as needed for indigestion. 02/12/16  Yes Historical Provider, MD  Nutritional Supplements (NEPRO) LIQD Take 237 mLs by mouth daily.   Yes Historical Provider, MD  sevelamer (RENVELA) 800 MG tablet Take 800 mg by mouth 3 (three) times daily with meals.    Yes Historical Provider, MD    Social History   Social History  . Marital status: Single    Spouse name: N/A  . Number of children: N/A  . Years of education: N/A   Occupational History  . Not on file.   Social History Main Topics  . Smoking status: Never  Smoker  . Smokeless tobacco: Never Used  . Alcohol use No  . Drug use: No  . Sexual activity: Yes    Birth control/ protection: Post-menopausal   Other Topics Concern  . Not on file   Social History Narrative  . No narrative on file     Family History  Problem Relation Age of Onset  . Diabetes Mother   . Hypertension Mother   . Heart disease Mother   . Alcohol abuse Mother   . Kidney disease Mother   . Cancer Father     COLON  . Alcohol abuse Father   . Breast cancer Sister 2  . Hypertension Sister   . Kidney disease Sister   . Hypertension Son     ROS: [x]  Positive   [ ]  Negative   [ ]  All sytems reviewed and are negative  Cardiovascular: []  chest pain/pressure []  palpitations []  SOB lying flat []  DOE []  pain in legs while walking []  pain in legs at rest []  pain in legs at night []  non-healing ulcers []  hx of DVT []  swelling in legs [x]  hx endocarditis with AVR  Pulmonary: []  productive cough []   asthma/wheezing []  home O2  Neurologic: [x]  weakness in [x]  left arm []  legs []  numbness in []   arm []  legs []  hx of CVA []  mini stroke [x] difficulty speaking or slurred speech []  temporary loss of vision in one eye []  dizziness  Hematologic: []  hx of cancer []  bleeding problems []  problems with blood clotting easily  Endocrine:   []  diabetes []  thyroid disease  GI []  vomiting blood []  blood in stool [x]  GERD  GU: [x]  CKD/renal failure [x]  HD--[x]  M/W/F or []  T/T/S []  burning with urination []  blood in urine  Psychiatric: []  anxiety []  depression  Musculoskeletal: []  arthritis []  joint pain  Integumentary: []  rashes []  ulcers  Constitutional: []  fever []  chills   Physical Examination  Vitals:   05/17/16 0600 05/17/16 0830  BP: 116/66 96/62  Pulse: 86 81  Resp: 20 20  Temp:  98.8 F (37.1 C)   Body mass index is 34.92 kg/m.  General:  WDWN in NAD Gait: Not observed HENT: WNL, normocephalic Pulmonary: normal  non-labored breathing, without Rales, rhonchi,  wheezing Cardiac: regular, without  Murmurs, rubs or gallops; without carotid bruits Abdomen:  soft, NT/ND, no masses Skin: without rashes Vascular Exam/Pulses:  Right Left  Radial Unable to palpate  Unable to palpate   Ulnar Unable to palpate  Unable to palpate   Femoral 2+ (normal) 2+ (normal)  DP 2+ (normal) 1+ (weak)  PT Unable to palpate  Unable to palpate    Extremities: without ischemic changes, without Gangrene , without cellulitis; without open wounds;  Musculoskeletal: no muscle wasting or atrophy  Neurologic: A&O X 3;  No focal weakness or paresthesias are detected; speech is fluent/normal Psychiatric:  The pt has Normal affect. Lymph:  No inguinal lymphadenopathy   CBC    Component Value Date/Time   WBC 5.9 05/17/2016 0919   RBC 4.26 05/17/2016 0919   HGB 12.0 05/17/2016 0919   HGB 10.8 (L) 07/04/2011 1058   HCT 38.6 05/17/2016 0919   HCT 32.9 (L) 07/04/2011 1058   PLT 178 05/17/2016 0919   PLT 260 07/04/2011 1058   MCV 90.6 05/17/2016 0919   MCV 99 07/04/2011 1058   MCH 28.2 05/17/2016 0919   MCHC 31.1 05/17/2016 0919   RDW 17.2 (H) 05/17/2016 0919   RDW 17.3 (H) 07/04/2011 1058   LYMPHSABS 2.0 05/16/2016 1204   MONOABS 0.4 05/16/2016 1204   EOSABS 0.1 05/16/2016 1204   BASOSABS 0.0 05/16/2016 1204    BMET    Component Value Date/Time   NA 136 05/17/2016 0549   NA 139 12/17/2012   NA 138 07/04/2011 1058   K 5.5 (H) 05/17/2016 0549   K 4.9 05/19/2012 0937   CL 100 (L) 05/17/2016 0549   CL 103 07/04/2011 1058   CO2 25 05/17/2016 0549   CO2 29 07/04/2011 1058   GLUCOSE 78 05/17/2016 0549   GLUCOSE 91 07/04/2011 1058   BUN <5 (L) 05/17/2016 0549   BUN 34 (A) 12/17/2012   BUN 18 07/04/2011 1058   CREATININE 2.85 (H) 05/17/2016 0549   CREATININE 5.85 (H) 10/28/2014 0918   CALCIUM 10.0 05/17/2016 0549   CALCIUM 10.2 (H) 07/04/2011 1058   GFRNONAA 17 (L) 05/17/2016 0549   GFRNONAA 7 (L)  07/04/2011 1058   GFRAA 19 (L) 05/17/2016 0549   GFRAA 8 (L) 07/04/2011 1058    COAGS: Lab Results  Component Value Date   INR 1.06 05/16/2016   INR 2.05 02/25/2016   INR 1.45 02/24/2016  Non-Invasive Vascular Imaging:   none  Statin:  No. Beta Blocker:  No. Aspirin:  Yes. ACEI:  No. ARB:  No. CCB use:  No Other antiplatelets/anticoagulants:  No.    ASSESSMENT/PLAN: This is a 61 y.o. female with ESRD admitted yesterday with aphasia and LUE weakness associated with hypotension after HD.  VVS is asked to evaluate for permanent access and suture removal.   -pt did have one stitch in the right antecubital space that was removed without difficulty. -pt has had multiple accesses in each arm.  She is currently dialyzing via left IJ TDC.  -We will have her f/u as an outpatient with BUE vein mapping as well as ABI's in case she needs a thigh graft.  Leontine Locket, PA-C [facility]   [phone]  Addendum  I have independently interviewed and examined the patient, and I agree with the physician assistant's findings.  Given recent hypotensive episode with neuro sx, I would not offer this patient any access immediately.  She likely has no further arm access options given her extensive access history (majority in [place]).  She likely needs a thigh AVG, which can be evaluated as an outpatient once medically stable with with BLE ABI, aortoiliac duplex, and BLE GSV mapping.  Adele Barthel, MD, FACS [facility] Office: [phone] Pager: [phone]  05/17/2016, 12:14 PM

## 2016-05-17 NOTE — Progress Notes (Signed)
Between visit of physician, made two visits to pt to discuss advanced directive form. Provided spiritual/emotional support and prayer. Pt will ask nurse to page chaplain if/when she is ready to execute form. Chaplain available for f/u.   05/17/16 1200  Clinical Encounter Type  Visited With Patient  Visit Type Follow-up  Referral From Nurse  Spiritual Encounters  Spiritual Needs Brochure;Prayer;Emotional  Stress Factors  Patient Stress Factors Health changes;Loss of control   Gerrit Heck, Chaplain

## 2016-05-17 NOTE — Progress Notes (Signed)
Family Medicine Teaching Service Daily Progress Note Intern Pager: [phone]  Patient name: [name] Marks Medical record number: [id number] Date of birth: 1955/09/25 Age: 61 y.o. Gender: female  Primary Care Provider: Lockie Pares, MD Consultants: neurology Code Status: DNR,DNI - wants meds and mask  Pt Overview and Major Events to Date:  3/21 admitted with TIA  Assessment and Plan: [name] Marks is a 61 y.o. female presenting with sudden onset aphasia and LUE weakness that lasted for 15 min and resolved on ED presentation. 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   Expressive Aphasia and L hand weakness, resolved: CT head NAICA. No hx of stroke, patient denies being on ASA or plavix. Hypotension after HD causing cerebral hypoperfusion could have also been the cause. Pt not taking her home Midodrine. TSH WNL. Lipid panel <2.2 average risk. -neurology consulted from ED, appreciate recs -MRI brain read pending -echo -carotid dopplers -HgbA1c pending -resume home ASA 325mg  -PT/OT/SLP  ESRD on HD MWF: Hx of access issues stemming from multiple failed fistulas and AV grafts. Last AV graft was prior to 02/15/16 admission and likely source of bacteremia at that time leading to endocarditis and valve replacement. Currently accessed via tunnel HD cath in L neck. Reports she was supposed to go to a vascular appointment today to check on the HD cath and sutures in her RUE from prior graft. Received HD today. K 4.3 in the ED. -follow renal function -continue home Sensipar and Renvenla -consult nephrology if expected to stay through 3/22 for additional HD -consider calling Dr. Nicole Cella office in am to discuss suture removal  Hyperkalemia: will call nephro to discuss, will also clarify midodrine dosing (PRN vs with HD).  -call nephro and consider kayexalate  Chronic  Hypotension: Pt with known hypotension, worse on HD days. BP in the ED was initially 77/53, but improved to 107/65 after receiving a 500cc bolus in the ED. Pt has not been taking her Midodrine. She is prescribed Midodrine 10mg  bid prn. Am cortisol WNL. 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 She was treated with a 6 week course of Ancef and Rifampin. Will order blood cultures as patient with history of endocarditis and bacteremia could cause embolic event. No fevers, chills, or other infectious symptoms. - blood cultures x 2  - repeat echo as above  Complex Atypical Endometrial Hyperplasia: patient with hx of known fibroids and concerning endometrial thickness on [place] 10/2015. Endometrial biopsy conducted showing complex atypical hyperplasia, which has a 20-30% risk of progression to cancer. GYN discussed case with gyn-onc who agreed to megace 80mg  daily x 3 months and then repeat endometrial biopsy. Patient has not had this that I can see from chart review. Last note from GYN recommended staying on Megace 40mg  BID until further gyn-onc eval.  - Will hold home Megace for now in the setting of concern for stroke, as it can cause thromboembolism - Can likely restart Megace if MRI is negative  Goals of care: patient would like no CPR nor intubation, ok with meds and non invasive respiratory support. States that both her daughter and her son help her make medical decisions. I recommended that she speak to her children and get HCPOA established.  -consult chaplain and CSW for HCPOA  FEN/GI: renal diet  Prophylaxis: SCDs for now, start Heparin sq if MRI is negative  Disposition: possible dispo home pending stroke workup  Subjective:  Patient feels back to normal and would like to go home.   Objective: Temp:  [97.6 F (36.4 C)-98.3 F (36.8 C)] 97.6 F (36.4 C)  (03/21 2046) Pulse Rate:  [47-92] 86 (03/22 0600) Resp:  [10-21] 20 (03/22 0600) BP: (71-142)/(41-74) 116/66 (03/22 0600) SpO2:  [90 %-100 %] 100 % (03/22 0600) Weight:  [178 lb 9.2 oz (81 kg)-178 lb 12.8 oz (81.1 kg)] 178 lb 12.8 oz (81.1 kg) (03/21 2046) Physical Exam: General: Obese female sitting up in chair in NAD. Cardiovascular: RRR, 2/6 murmur best heard at RUSB Respiratory: CTAB, easy WOB, no wheezes Abdomen: SNTND +BS Extremities: scars over R bicep, R AC, sutures in place over R AC. Appropriate muscle tone and bulk.   Laboratory:  Recent Labs Lab 05/16/16 1204 05/16/16 1221  WBC 5.5  --   HGB 11.5* 13.6  HCT 36.1 40.0  PLT 180  --     Recent Labs Lab 05/16/16 1204 05/16/16 1221 05/17/16 0549  NA 137 137 136  K 4.3 4.2 5.5*  CL 99* 99* 100*  CO2 29  --  25  BUN <5* <3* <5*  CREATININE 1.66* 1.60* 2.85*  CALCIUM 8.7*  --  10.0  PROT 6.3*  --   --   BILITOT 0.8  --   --   ALKPHOS 176*  --   --   ALT 13*  --   --   AST 26  --   --   GLUCOSE 79 78 78    Imaging/Diagnostic Tests: Dg Chest 2 View  Result Date: 05/16/2016 CLINICAL DATA:  Weakness, aphasia EXAM: CHEST  2 VIEW COMPARISON:  05/15/2016 FINDINGS: There is no focal parenchymal opacity. There is no pleural effusion or pneumothorax. The heart and mediastinal contours are unremarkable. There is evidence of prior median sternotomy with aortic valve replacement. There is a dual-lumen left-sided central venous catheter in satisfactory position. Right subclavian stent is noted. The osseous structures are unremarkable. IMPRESSION: No active cardiopulmonary disease. Electronically Signed   By: Kathreen Devoid   On: 05/16/2016 12:54   Dg Chest 2 View  Result Date: 05/15/2016 CLINICAL DATA:  Weakness, shortness of breath with exertion, status post AVR EXAM: CHEST  2 VIEW COMPARISON:  03/02/2016 FINDINGS: Lungs are clear.  No pleural effusion or pneumothorax. Heart is normal in size.  Prosthetic aortic valve. Left  dual lumen catheter terminates in the upper right atrium. Bilateral axillary stents. Median sternotomy. IMPRESSION: No evidence of acute cardiopulmonary disease. Postsurgical/postprocedural changes, as above. Electronically Signed   By: Julian Hy M.D.   On: 05/15/2016 16:18   Ct Head Wo Contrast  Result Date: 05/16/2016 CLINICAL DATA:  Patient riding in car when suddenly unable to speak with LEFT arm numbness. Speech arrest has improved. EXAM: CT HEAD WITHOUT CONTRAST TECHNIQUE: Contiguous axial images were obtained from the base of the skull through the vertex without intravenous contrast. COMPARISON:  MR brain 02/15/2016. FINDINGS: Brain: No evidence for acute infarction, hemorrhage, mass lesion, hydrocephalus, or extra-axial fluid. Generalized atrophy. Extensive hypoattenuation of white matter correlating with chronic microvascular ischemic change noted on MR. No CT signs of acute cortical infarction. Vascular: Mild vascular calcification in the skull base carotid arteries, without signs of large vessel occlusion. Skull: Normal. Negative for fracture or focal lesion. Sinuses/Orbits: No acute finding. Other: Trace layering fluid RIGHT mastoid. No nasopharyngeal process. IMPRESSION: Negative for  acute cortical infarction or proximal large vessel occlusion. Atrophy and small vessel disease appears similar to priors. Electronically Signed   By: Staci Righter M.D.   On: 05/16/2016 12:40   Mr Brain Wo Contrast  Result Date: 05/17/2016 CLINICAL DATA:  Episode of speech arrest following dialysis. 15 minutes duration with resolution. Dialysis related hypotension. EXAM: MRI HEAD WITHOUT CONTRAST TECHNIQUE: Multiplanar, multiecho pulse sequences of the brain and surrounding structures were obtained without intravenous contrast. COMPARISON:  CT 05/16/2016.  MRI 02/15/2016. FINDINGS: Brain: Diffusion imaging does not show any acute or subacute infarction. There is cerebellar atrophy. No focal brainstem insult.  Cerebral hemispheres show atrophy with moderate to marked chronic small-vessel ischemic changes throughout the deep and subcortical white matter. No cortical or large vessel territory infarction. No mass lesion, hemorrhage, hydrocephalus or extra-axial collection. Vascular: Major vessels at the base of the brain show flow. Skull and upper cervical spine: Negative Sinuses/Orbits: Clear/ normal. Bilateral mastoid effusions are present. Other: None significant IMPRESSION: No acute brain parenchymal finding. Atrophy and chronic small vessel ischemic changes as outlined above. Bilateral mastoid effusions. Electronically Signed   By: Nelson Chimes M.D.   On: 05/17/2016 09:09     Sela Hilding, MD 05/17/2016, 8:11 AM PGY-1, [place] Intern pager: [phone], text pages welcome

## 2016-05-18 ENCOUNTER — Telehealth: Payer: Self-pay | Admitting: Vascular Surgery

## 2016-05-18 DIAGNOSIS — D509 Iron deficiency anemia, unspecified: Secondary | ICD-10-CM | POA: Diagnosis not present

## 2016-05-18 DIAGNOSIS — N186 End stage renal disease: Secondary | ICD-10-CM | POA: Diagnosis not present

## 2016-05-18 DIAGNOSIS — N2581 Secondary hyperparathyroidism of renal origin: Secondary | ICD-10-CM | POA: Diagnosis not present

## 2016-05-18 DIAGNOSIS — D631 Anemia in chronic kidney disease: Secondary | ICD-10-CM | POA: Diagnosis not present

## 2016-05-18 LAB — HEMOGLOBIN A1C

## 2016-05-18 NOTE — Telephone Encounter (Signed)
Sched aorto-iliac 06/28/16 at 8:00. Sched ABI, LE vein map + MD on 07/13/16 at 2:00 and 3:00. Dates ok'ed by [facility]. Lm on cell# to inform pt of appt.

## 2016-05-18 NOTE — Telephone Encounter (Signed)
-----   Message from Denman George, RN sent at 05/17/2016  3:10 PM EDT ----- Regarding: needs next avail. appt. with any MD with several vasc. labs (see note) Needs BLE ABI's, Aortoiliac duplex, and bilat GSV vein map with appt. to prepare for poss. Thigh AVG.   ----- Message ----- From: Conrad [place], MD Sent: 05/17/2016  12:16 PM To: Vvs Charge Pool  [name] Marks [id number] 10/05/1955  Level 3 consult  Pt needs follow up with next available attending for evaluation for thigh AVG.  To facilitate this evaluation, get:  1. BLE ABI 2. Aortoiliac duplex 3. B GSV mapping (proximal thigh adequate)

## 2016-05-21 ENCOUNTER — Telehealth: Payer: Self-pay | Admitting: *Deleted

## 2016-05-21 ENCOUNTER — Other Ambulatory Visit: Payer: Self-pay

## 2016-05-21 DIAGNOSIS — D631 Anemia in chronic kidney disease: Secondary | ICD-10-CM | POA: Diagnosis not present

## 2016-05-21 DIAGNOSIS — N186 End stage renal disease: Secondary | ICD-10-CM | POA: Diagnosis not present

## 2016-05-21 DIAGNOSIS — D509 Iron deficiency anemia, unspecified: Secondary | ICD-10-CM | POA: Diagnosis not present

## 2016-05-21 DIAGNOSIS — N2581 Secondary hyperparathyroidism of renal origin: Secondary | ICD-10-CM | POA: Diagnosis not present

## 2016-05-21 LAB — CULTURE, BLOOD (ROUTINE X 2)
Culture: NO GROWTH
Culture: NO GROWTH

## 2016-05-21 NOTE — Telephone Encounter (Signed)
Loni Muse from [facility] is calling requesting orders for PT/OT on this patient.  Pt is requesting [facility] as the [place].  Did not see any order from hospital in computer. Will forward to MD. Javanna Patin, Salome Spotted, [place]

## 2016-05-22 ENCOUNTER — Encounter: Payer: Self-pay | Admitting: Cardiology

## 2016-05-22 DIAGNOSIS — M5441 Lumbago with sciatica, right side: Secondary | ICD-10-CM | POA: Diagnosis not present

## 2016-05-22 DIAGNOSIS — Z8673 Personal history of transient ischemic attack (TIA), and cerebral infarction without residual deficits: Secondary | ICD-10-CM | POA: Diagnosis not present

## 2016-05-22 DIAGNOSIS — D649 Anemia, unspecified: Secondary | ICD-10-CM | POA: Diagnosis not present

## 2016-05-22 DIAGNOSIS — M5442 Lumbago with sciatica, left side: Secondary | ICD-10-CM | POA: Diagnosis not present

## 2016-05-22 DIAGNOSIS — K219 Gastro-esophageal reflux disease without esophagitis: Secondary | ICD-10-CM | POA: Diagnosis not present

## 2016-05-22 DIAGNOSIS — E669 Obesity, unspecified: Secondary | ICD-10-CM | POA: Diagnosis not present

## 2016-05-22 DIAGNOSIS — I9589 Other hypotension: Secondary | ICD-10-CM | POA: Diagnosis not present

## 2016-05-22 DIAGNOSIS — Z992 Dependence on renal dialysis: Secondary | ICD-10-CM | POA: Diagnosis not present

## 2016-05-22 DIAGNOSIS — N8500 Endometrial hyperplasia, unspecified: Secondary | ICD-10-CM | POA: Diagnosis not present

## 2016-05-22 DIAGNOSIS — N186 End stage renal disease: Secondary | ICD-10-CM | POA: Diagnosis not present

## 2016-05-22 DIAGNOSIS — Z952 Presence of prosthetic heart valve: Secondary | ICD-10-CM | POA: Diagnosis not present

## 2016-05-22 NOTE — Telephone Encounter (Signed)
Faxed in order to resume PT/OT today

## 2016-05-22 NOTE — Patient Outreach (Signed)
[facility]) Care Management  05/22/2016   [name] Marks [birth year] [id number]  Subjective:  I went to the hospital after I was unable to talk. I was with my son and he thought I was dying.  Objective:  Telephonic encounter for transition of care  Current Medications:  Current Outpatient Prescriptions  Medication Sig Dispense Refill  . aspirin EC 325 MG EC tablet Take 1 tablet (325 mg total) by mouth daily. 30 tablet 0  . cinacalcet (SENSIPAR) 60 MG tablet Take 60 mg by mouth daily.    . famotidine (PEPCID) 20 MG tablet Take 20 mg by mouth daily.     . megestrol (MEGACE) 40 MG tablet Take 1 tablet (40 mg total) by mouth daily. 30 tablet 0  . midodrine (PROAMATINE) 10 MG tablet Take 1 tablet (10 mg total) by mouth 2 (two) times daily as needed (30 min prior to HD). 60 tablet 0  . Nutritional Supplements (NEPRO) LIQD Take 237 mLs by mouth daily.    . sevelamer (RENVELA) 800 MG tablet Take 800 mg by mouth 3 (three) times daily with meals.      No current facility-administered medications for this visit.     Functional Status:  In your present state of health, do you have any difficulty performing the following activities: 05/16/2016 04/02/2016  Hearing? N N  Vision? N Y  Difficulty concentrating or making decisions? Y N  Walking or climbing stairs? Y Y  Dressing or bathing? Y Y  Doing errands, shopping? - Y  Preparing Food and eating ? - Y  Using the Toilet? - N  In the past six months, have you accidently leaked urine? - N  Do you have problems with loss of bowel control? - N  Managing your Medications? - N  Managing your Finances? - N  Housekeeping or managing your Housekeeping? - Y  Some recent data might be hidden    Fall/Depression Screening: PHQ 2/9 Scores 05/03/2016 04/02/2016 03/29/2016 03/27/2016 10/28/2014 01/08/2013  PHQ - 2 Score 0 0 0 0 0 0  PHQ- 9 Score 0 - - - - -   [facility] CM Care Plan Problem One     Most Recent Value  Care Plan Problem One   Patient is deconditioned   Role Documenting the Problem One  Care Management [place] for Problem One  Active  [facility] Long Term Goal (31-90 days)  In the next 31 patient will meet with [facility] RNCM to assess need for community care coordination  [facility] Long Term Goal Start Date  05/21/16  Interventions for Problem One Long Term Goal  transition of care call for assessment of needs, establish case management goals  [facility] CM Short Term Goal #1 (0-30 days)  Patient will meet with   [facility] CM Short Term Goal #1 Start Date  05/21/16  Interventions for Short Term Goal #1  transitionof care call     Fall Risk  04/02/2016 03/27/2016 10/28/2014 01/08/2013  Falls in the past year? Yes Yes No No  Number falls in past yr: 2 or more 2 or more - -  Injury with Fall? Yes Yes - -  Risk Factor Category  High Fall Risk High Fall Risk - -  Risk for fall due to : History of fall(s);Impaired balance/gait;Impaired vision;Medication side effect;Other (Comment) History of fall(s);Impaired balance/gait;Impaired mobility;Medication side effect - -  Risk for fall due to (comments): hemodialysis patient - - -  Follow up Follow up appointment Falls evaluation  completed;Education provided - -   Assessment:  Patient had recent hospitalization to rule out CVA. Patient reports being deconditioned. Patient has support from son, sister from [place] is reported to be coming this week to stay with patient to assist her. Patient endorses continuing with her previous case management goals. Call made to [facility], requested HHPT/OT order to be resumed with service being provided by [facility].  Plan:  Home visit in the next 21 days for further assessment of community care coordination needs.

## 2016-05-23 ENCOUNTER — Inpatient Hospital Stay: Payer: Medicare Other | Admitting: Internal Medicine

## 2016-05-23 ENCOUNTER — Telehealth: Payer: Self-pay | Admitting: Family Medicine

## 2016-05-23 ENCOUNTER — Telehealth: Payer: Self-pay | Admitting: Internal Medicine

## 2016-05-23 ENCOUNTER — Telehealth: Payer: Self-pay | Admitting: Cardiology

## 2016-05-23 DIAGNOSIS — D509 Iron deficiency anemia, unspecified: Secondary | ICD-10-CM | POA: Diagnosis not present

## 2016-05-23 DIAGNOSIS — N186 End stage renal disease: Secondary | ICD-10-CM | POA: Diagnosis not present

## 2016-05-23 DIAGNOSIS — N2581 Secondary hyperparathyroidism of renal origin: Secondary | ICD-10-CM | POA: Diagnosis not present

## 2016-05-23 DIAGNOSIS — D631 Anemia in chronic kidney disease: Secondary | ICD-10-CM | POA: Diagnosis not present

## 2016-05-23 NOTE — Telephone Encounter (Signed)
Left message with PT that orders will need to come from PCP.  Instructed her to call with any further questions or concerns.

## 2016-05-23 NOTE — Telephone Encounter (Signed)
Called patient to discuss A1c results from her hospitalization (it came to me as I was the admitting inpatient attending).  A1c done for risk stratification, and result was "<4.2" Unsure what to make of this - concern would be for some kind of hypoglycemic syndrome (?insulinoma etc) When asked, she says she has occasional dizziness/lightheadedness/shakiness. Glucoses were in 60s and 70s primarily during her hospitalization.  She has a hospital follow up appointment scheduled here next week with Dr. Brita Romp. Recommend repeating A1c at that visit. Will route this to Dr. Brita Romp to obtain A1c at hospital follow up visit.  Leeanne Rio, MD

## 2016-05-23 NOTE — Telephone Encounter (Signed)
New message       Calling to request home health PT 2 times a week for 4 weeks due to weakness and falls. Also, requesting speech therapy eval due to word finding problems and cognitive changes.

## 2016-05-23 NOTE — Telephone Encounter (Signed)
Verbal orders for Pt twice a week for four weeks. Speech therapy consult for word finding/memory problems. ep

## 2016-05-24 DIAGNOSIS — Z8673 Personal history of transient ischemic attack (TIA), and cerebral infarction without residual deficits: Secondary | ICD-10-CM | POA: Diagnosis not present

## 2016-05-24 DIAGNOSIS — K219 Gastro-esophageal reflux disease without esophagitis: Secondary | ICD-10-CM | POA: Diagnosis not present

## 2016-05-24 DIAGNOSIS — M5442 Lumbago with sciatica, left side: Secondary | ICD-10-CM | POA: Diagnosis not present

## 2016-05-24 DIAGNOSIS — M5441 Lumbago with sciatica, right side: Secondary | ICD-10-CM | POA: Diagnosis not present

## 2016-05-24 DIAGNOSIS — N186 End stage renal disease: Secondary | ICD-10-CM | POA: Diagnosis not present

## 2016-05-24 DIAGNOSIS — I9589 Other hypotension: Secondary | ICD-10-CM | POA: Diagnosis not present

## 2016-05-24 NOTE — Telephone Encounter (Signed)
Please give verbal order for physical therapy.  Thanks

## 2016-05-24 NOTE — Telephone Encounter (Signed)
Verbal ok given to [place].  Jazmin Hartsell,CMA

## 2016-05-25 DIAGNOSIS — D631 Anemia in chronic kidney disease: Secondary | ICD-10-CM | POA: Diagnosis not present

## 2016-05-25 DIAGNOSIS — N2581 Secondary hyperparathyroidism of renal origin: Secondary | ICD-10-CM | POA: Diagnosis not present

## 2016-05-25 DIAGNOSIS — N186 End stage renal disease: Secondary | ICD-10-CM | POA: Diagnosis not present

## 2016-05-25 DIAGNOSIS — D509 Iron deficiency anemia, unspecified: Secondary | ICD-10-CM | POA: Diagnosis not present

## 2016-05-26 DIAGNOSIS — N186 End stage renal disease: Secondary | ICD-10-CM | POA: Diagnosis not present

## 2016-05-26 DIAGNOSIS — K219 Gastro-esophageal reflux disease without esophagitis: Secondary | ICD-10-CM | POA: Diagnosis not present

## 2016-05-26 DIAGNOSIS — M5442 Lumbago with sciatica, left side: Secondary | ICD-10-CM | POA: Diagnosis not present

## 2016-05-26 DIAGNOSIS — I9589 Other hypotension: Secondary | ICD-10-CM | POA: Diagnosis not present

## 2016-05-26 DIAGNOSIS — M5441 Lumbago with sciatica, right side: Secondary | ICD-10-CM | POA: Diagnosis not present

## 2016-05-26 DIAGNOSIS — Z992 Dependence on renal dialysis: Secondary | ICD-10-CM | POA: Diagnosis not present

## 2016-05-26 DIAGNOSIS — N039 Chronic nephritic syndrome with unspecified morphologic changes: Secondary | ICD-10-CM | POA: Diagnosis not present

## 2016-05-26 DIAGNOSIS — Z8673 Personal history of transient ischemic attack (TIA), and cerebral infarction without residual deficits: Secondary | ICD-10-CM | POA: Diagnosis not present

## 2016-05-28 ENCOUNTER — Telehealth: Payer: Self-pay | Admitting: *Deleted

## 2016-05-28 ENCOUNTER — Telehealth: Payer: Self-pay | Admitting: Internal Medicine

## 2016-05-28 DIAGNOSIS — D509 Iron deficiency anemia, unspecified: Secondary | ICD-10-CM | POA: Diagnosis not present

## 2016-05-28 DIAGNOSIS — D631 Anemia in chronic kidney disease: Secondary | ICD-10-CM | POA: Diagnosis not present

## 2016-05-28 DIAGNOSIS — N186 End stage renal disease: Secondary | ICD-10-CM | POA: Diagnosis not present

## 2016-05-28 DIAGNOSIS — N2581 Secondary hyperparathyroidism of renal origin: Secondary | ICD-10-CM | POA: Diagnosis not present

## 2016-05-28 NOTE — Telephone Encounter (Signed)
Patient confirmed hospital follow-up appointment for 05/29/16.

## 2016-05-28 NOTE — Telephone Encounter (Signed)
[name] from [facility] called to request verbal orders for OT for the patient.  1 time a week for 1 week 2 time a week for 2 week 1 time a week for 1 week.  Any questions please call [phone]. jw

## 2016-05-29 ENCOUNTER — Encounter: Payer: Self-pay | Admitting: Family Medicine

## 2016-05-29 ENCOUNTER — Ambulatory Visit (INDEPENDENT_AMBULATORY_CARE_PROVIDER_SITE_OTHER): Payer: Medicare Other | Admitting: Family Medicine

## 2016-05-29 VITALS — BP 118/70 | HR 80 | Temp 98.4°F | Ht 60.0 in | Wt 180.0 lb

## 2016-05-29 DIAGNOSIS — Z8673 Personal history of transient ischemic attack (TIA), and cerebral infarction without residual deficits: Secondary | ICD-10-CM | POA: Diagnosis not present

## 2016-05-29 DIAGNOSIS — H7493 Unspecified disorder of middle ear and mastoid, bilateral: Secondary | ICD-10-CM | POA: Diagnosis not present

## 2016-05-29 DIAGNOSIS — R4701 Aphasia: Secondary | ICD-10-CM | POA: Diagnosis not present

## 2016-05-29 DIAGNOSIS — E162 Hypoglycemia, unspecified: Secondary | ICD-10-CM | POA: Diagnosis not present

## 2016-05-29 DIAGNOSIS — I9589 Other hypotension: Secondary | ICD-10-CM | POA: Diagnosis not present

## 2016-05-29 DIAGNOSIS — M79604 Pain in right leg: Secondary | ICD-10-CM | POA: Diagnosis not present

## 2016-05-29 DIAGNOSIS — N8502 Endometrial intraepithelial neoplasia [EIN]: Secondary | ICD-10-CM | POA: Diagnosis not present

## 2016-05-29 DIAGNOSIS — N186 End stage renal disease: Secondary | ICD-10-CM | POA: Diagnosis not present

## 2016-05-29 DIAGNOSIS — M5441 Lumbago with sciatica, right side: Secondary | ICD-10-CM | POA: Diagnosis not present

## 2016-05-29 DIAGNOSIS — M5442 Lumbago with sciatica, left side: Secondary | ICD-10-CM | POA: Diagnosis not present

## 2016-05-29 DIAGNOSIS — I639 Cerebral infarction, unspecified: Secondary | ICD-10-CM

## 2016-05-29 DIAGNOSIS — K219 Gastro-esophageal reflux disease without esophagitis: Secondary | ICD-10-CM | POA: Diagnosis not present

## 2016-05-29 LAB — POCT GLYCOSYLATED HEMOGLOBIN (HGB A1C): Hemoglobin A1C: 4.3

## 2016-05-29 NOTE — Assessment & Plan Note (Signed)
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

## 2016-05-29 NOTE — Telephone Encounter (Signed)
Please let [facility] know that I agree with stated plan   Thanks Orion Vandervort

## 2016-05-29 NOTE — Patient Instructions (Signed)
Nice to meet you today. I'm glad that you are low blood pressure and speaking difficulties and numbness have improved. You should make a follow-up appointment with your gynecologist as you're Megace was stopped during her hospitalization. Continue to work with nephrology on your hypotension and midodrine.  Follow-up with PCP for chronic medical issues in 1-2 months.  Take Care, Dr B

## 2016-05-29 NOTE — Assessment & Plan Note (Signed)
Holding Megace Patient to follow-up with gynecology

## 2016-05-29 NOTE — Telephone Encounter (Signed)
Left message on [name]'s voicemail authorizing verbal orders.

## 2016-05-29 NOTE — Assessment & Plan Note (Signed)
Bilateral mastoid effusions noted incidentally on MRI brain Patient is asymptomatic at this time She does intermittently have some eustachian tube dysfunction, for which I recommended Flonase Return precautions discussed No further evaluation necessary at this time

## 2016-05-29 NOTE — Assessment & Plan Note (Signed)
Pain is likely related to her poor gait pattern Continue working with physical therapy

## 2016-05-29 NOTE — Progress Notes (Signed)
Subjective:   [name] Marks is a 61 y.o. female with a history of CVA, ESRD, complex endometrial hyperplasia with atypia, aortic valve replacement with bioprosthetic valve, bacteremia here for hospital follow-up for TIA  Patient was hospitalized 05/16/16 for aphasia and left upper extremity numbness that has resolved on presentation. She was admitted for a stroke workup. CT head and brain MRI did not show any CVA. Etiology was thought to be secondary to chronic hypotension which was acutely worsened by dialysis causing cerebral hypoperfusion. Since hospitalization, patient reports She is back at baseline without aphasia or numbness.  Taking midodrine 30 min before dialysis and helps with hypotension.  Has Nephrology f/u scheduled within the next month. She has an appointment with her gynecologist in July, but plans to reschedule this for sooner as Megace was stopped during her hospitalization.  Her other concern is her right-sided leg and flank pain with walking. This is been present for several months. She was told that it may have to do with constipation, but she only feels the pain when she is walking. Most of the time, she uses a wheelchair to get around, but in the house, she uses a walker. She is getting home health PT services currently. She feels as though she limps and her leg does not straighten and she is not sure why. She denies any nausea, vomiting, diarrhea, melena, hematochezia, constipation. She has ongoing intermittent vaginal bleeding related to her endometrial hyperplasia.  She denies any ear pain, ear drainage, facial pain, fevers, nasal congestion.  Review of Systems:  Per HPI.   Social History: never smoker  Objective:  BP 118/70 (BP Location: Left Wrist, Patient Position: Sitting, Cuff Size: Normal)   Pulse 80   Temp 98.4 F (36.9 C) (Oral)   Ht 5' (1.524 m)   Wt 180 lb (81.6 kg)   SpO2 99%   BMI 35.15 kg/m   Gen:  61 y.o. female in NAD, sitting in  wheelchair HEENT: NCAT, MMM, EOMI, PERRL, anicteric sclerae, OP clear, TMs clear b/l CV: RRR, 2/6 systolic murmur heard best at the right upper sternal border Resp: Non-labored, CTAB, no wheezes noted Abd: Soft, NTND, BS present, no guarding or organomegaly Ext: WWP, no edema MSK: Gait slow and shuffling, Trendelenburg gait present. No pain with hip internal and external rotation, knee range of motion intact Neuro: Alert and oriented, speech normal, radial nerves intact, strength 5 out of 5 diffusely in upper extremities, strength 4 out of 5 with hip flexion bilaterally, strength 5 out of 5 otherwise throughout lower extremities, sensation intact grossly to light touch       Chemistry      Component Value Date/Time   NA 136 05/17/2016 0549   NA 139 12/17/2012   NA 138 07/04/2011 1058   K 5.5 (H) 05/17/2016 0549   K 4.9 05/19/2012 0937   CL 100 (L) 05/17/2016 0549   CL 103 07/04/2011 1058   CO2 25 05/17/2016 0549   CO2 29 07/04/2011 1058   BUN <5 (L) 05/17/2016 0549   BUN 34 (A) 12/17/2012   BUN 18 07/04/2011 1058   CREATININE 2.85 (H) 05/17/2016 0549   CREATININE 5.85 (H) 10/28/2014 0918   GLU 102 12/17/2012      Component Value Date/Time   CALCIUM 10.0 05/17/2016 0549   CALCIUM 10.2 (H) 07/04/2011 1058   ALKPHOS 176 (H) 05/16/2016 1204   AST 26 05/16/2016 1204   ALT 13 (L) 05/16/2016 1204   BILITOT 0.8 05/16/2016 1204  Lab Results  Component Value Date   WBC 5.9 05/17/2016   HGB 12.0 05/17/2016   HCT 38.6 05/17/2016   MCV 90.6 05/17/2016   PLT 178 05/17/2016   Lab Results  Component Value Date   TSH 2.809 05/17/2016   Lab Results  Component Value Date   HGBA1C 4.3 05/29/2016   Assessment & Plan:     [name] Marks is a 61 y.o. 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

## 2016-05-30 ENCOUNTER — Telehealth: Payer: Self-pay | Admitting: Internal Medicine

## 2016-05-30 DIAGNOSIS — D509 Iron deficiency anemia, unspecified: Secondary | ICD-10-CM | POA: Diagnosis not present

## 2016-05-30 DIAGNOSIS — D631 Anemia in chronic kidney disease: Secondary | ICD-10-CM | POA: Diagnosis not present

## 2016-05-30 DIAGNOSIS — N186 End stage renal disease: Secondary | ICD-10-CM | POA: Diagnosis not present

## 2016-05-30 DIAGNOSIS — N2581 Secondary hyperparathyroidism of renal origin: Secondary | ICD-10-CM | POA: Diagnosis not present

## 2016-05-30 NOTE — Telephone Encounter (Signed)
[name] from [facility] called and would like to request verbal orders for the patient to have a bath aide 2 times a week for 2 weeks. Please call her at [phone]. jw

## 2016-05-31 DIAGNOSIS — M5442 Lumbago with sciatica, left side: Secondary | ICD-10-CM | POA: Diagnosis not present

## 2016-05-31 DIAGNOSIS — I9589 Other hypotension: Secondary | ICD-10-CM | POA: Diagnosis not present

## 2016-05-31 DIAGNOSIS — M5441 Lumbago with sciatica, right side: Secondary | ICD-10-CM | POA: Diagnosis not present

## 2016-05-31 DIAGNOSIS — K219 Gastro-esophageal reflux disease without esophagitis: Secondary | ICD-10-CM | POA: Diagnosis not present

## 2016-05-31 DIAGNOSIS — Z8673 Personal history of transient ischemic attack (TIA), and cerebral infarction without residual deficits: Secondary | ICD-10-CM | POA: Diagnosis not present

## 2016-05-31 DIAGNOSIS — N186 End stage renal disease: Secondary | ICD-10-CM | POA: Diagnosis not present

## 2016-05-31 NOTE — Telephone Encounter (Signed)
Please provide verbal confirmation for request. Thanks [name] Marks

## 2016-05-31 NOTE — Telephone Encounter (Signed)
Verbal orders given  

## 2016-06-01 DIAGNOSIS — D631 Anemia in chronic kidney disease: Secondary | ICD-10-CM | POA: Diagnosis not present

## 2016-06-01 DIAGNOSIS — I9589 Other hypotension: Secondary | ICD-10-CM | POA: Diagnosis not present

## 2016-06-01 DIAGNOSIS — N2581 Secondary hyperparathyroidism of renal origin: Secondary | ICD-10-CM | POA: Diagnosis not present

## 2016-06-01 DIAGNOSIS — Z8673 Personal history of transient ischemic attack (TIA), and cerebral infarction without residual deficits: Secondary | ICD-10-CM | POA: Diagnosis not present

## 2016-06-01 DIAGNOSIS — K219 Gastro-esophageal reflux disease without esophagitis: Secondary | ICD-10-CM | POA: Diagnosis not present

## 2016-06-01 DIAGNOSIS — D509 Iron deficiency anemia, unspecified: Secondary | ICD-10-CM | POA: Diagnosis not present

## 2016-06-01 DIAGNOSIS — M5441 Lumbago with sciatica, right side: Secondary | ICD-10-CM | POA: Diagnosis not present

## 2016-06-01 DIAGNOSIS — M5442 Lumbago with sciatica, left side: Secondary | ICD-10-CM | POA: Diagnosis not present

## 2016-06-01 DIAGNOSIS — N186 End stage renal disease: Secondary | ICD-10-CM | POA: Diagnosis not present

## 2016-06-04 DIAGNOSIS — N186 End stage renal disease: Secondary | ICD-10-CM | POA: Diagnosis not present

## 2016-06-04 DIAGNOSIS — D509 Iron deficiency anemia, unspecified: Secondary | ICD-10-CM | POA: Diagnosis not present

## 2016-06-04 DIAGNOSIS — N2581 Secondary hyperparathyroidism of renal origin: Secondary | ICD-10-CM | POA: Diagnosis not present

## 2016-06-04 DIAGNOSIS — D631 Anemia in chronic kidney disease: Secondary | ICD-10-CM | POA: Diagnosis not present

## 2016-06-05 DIAGNOSIS — I9589 Other hypotension: Secondary | ICD-10-CM | POA: Diagnosis not present

## 2016-06-05 DIAGNOSIS — Z8673 Personal history of transient ischemic attack (TIA), and cerebral infarction without residual deficits: Secondary | ICD-10-CM | POA: Diagnosis not present

## 2016-06-05 DIAGNOSIS — K219 Gastro-esophageal reflux disease without esophagitis: Secondary | ICD-10-CM | POA: Diagnosis not present

## 2016-06-05 DIAGNOSIS — M5441 Lumbago with sciatica, right side: Secondary | ICD-10-CM | POA: Diagnosis not present

## 2016-06-05 DIAGNOSIS — N186 End stage renal disease: Secondary | ICD-10-CM | POA: Diagnosis not present

## 2016-06-05 DIAGNOSIS — M5442 Lumbago with sciatica, left side: Secondary | ICD-10-CM | POA: Diagnosis not present

## 2016-06-06 DIAGNOSIS — N2581 Secondary hyperparathyroidism of renal origin: Secondary | ICD-10-CM | POA: Diagnosis not present

## 2016-06-06 DIAGNOSIS — N186 End stage renal disease: Secondary | ICD-10-CM | POA: Diagnosis not present

## 2016-06-06 DIAGNOSIS — D631 Anemia in chronic kidney disease: Secondary | ICD-10-CM | POA: Diagnosis not present

## 2016-06-06 DIAGNOSIS — D509 Iron deficiency anemia, unspecified: Secondary | ICD-10-CM | POA: Diagnosis not present

## 2016-06-07 DIAGNOSIS — I9589 Other hypotension: Secondary | ICD-10-CM | POA: Diagnosis not present

## 2016-06-07 DIAGNOSIS — N186 End stage renal disease: Secondary | ICD-10-CM | POA: Diagnosis not present

## 2016-06-07 DIAGNOSIS — M5441 Lumbago with sciatica, right side: Secondary | ICD-10-CM | POA: Diagnosis not present

## 2016-06-07 DIAGNOSIS — Z8673 Personal history of transient ischemic attack (TIA), and cerebral infarction without residual deficits: Secondary | ICD-10-CM | POA: Diagnosis not present

## 2016-06-07 DIAGNOSIS — K219 Gastro-esophageal reflux disease without esophagitis: Secondary | ICD-10-CM | POA: Diagnosis not present

## 2016-06-07 DIAGNOSIS — M5442 Lumbago with sciatica, left side: Secondary | ICD-10-CM | POA: Diagnosis not present

## 2016-06-08 DIAGNOSIS — N2581 Secondary hyperparathyroidism of renal origin: Secondary | ICD-10-CM | POA: Diagnosis not present

## 2016-06-08 DIAGNOSIS — N186 End stage renal disease: Secondary | ICD-10-CM | POA: Diagnosis not present

## 2016-06-08 DIAGNOSIS — D509 Iron deficiency anemia, unspecified: Secondary | ICD-10-CM | POA: Diagnosis not present

## 2016-06-08 DIAGNOSIS — D631 Anemia in chronic kidney disease: Secondary | ICD-10-CM | POA: Diagnosis not present

## 2016-06-11 DIAGNOSIS — N186 End stage renal disease: Secondary | ICD-10-CM | POA: Diagnosis not present

## 2016-06-11 DIAGNOSIS — D509 Iron deficiency anemia, unspecified: Secondary | ICD-10-CM | POA: Diagnosis not present

## 2016-06-11 DIAGNOSIS — D631 Anemia in chronic kidney disease: Secondary | ICD-10-CM | POA: Diagnosis not present

## 2016-06-11 DIAGNOSIS — N2581 Secondary hyperparathyroidism of renal origin: Secondary | ICD-10-CM | POA: Diagnosis not present

## 2016-06-13 ENCOUNTER — Other Ambulatory Visit: Payer: Self-pay | Admitting: Family Medicine

## 2016-06-13 DIAGNOSIS — N186 End stage renal disease: Secondary | ICD-10-CM | POA: Diagnosis not present

## 2016-06-13 DIAGNOSIS — D509 Iron deficiency anemia, unspecified: Secondary | ICD-10-CM | POA: Diagnosis not present

## 2016-06-13 DIAGNOSIS — D631 Anemia in chronic kidney disease: Secondary | ICD-10-CM | POA: Diagnosis not present

## 2016-06-13 DIAGNOSIS — N2581 Secondary hyperparathyroidism of renal origin: Secondary | ICD-10-CM | POA: Diagnosis not present

## 2016-06-14 ENCOUNTER — Telehealth: Payer: Self-pay | Admitting: Internal Medicine

## 2016-06-14 DIAGNOSIS — M5441 Lumbago with sciatica, right side: Secondary | ICD-10-CM | POA: Diagnosis not present

## 2016-06-14 DIAGNOSIS — K219 Gastro-esophageal reflux disease without esophagitis: Secondary | ICD-10-CM | POA: Diagnosis not present

## 2016-06-14 DIAGNOSIS — Z8673 Personal history of transient ischemic attack (TIA), and cerebral infarction without residual deficits: Secondary | ICD-10-CM | POA: Diagnosis not present

## 2016-06-14 DIAGNOSIS — R1310 Dysphagia, unspecified: Secondary | ICD-10-CM

## 2016-06-14 DIAGNOSIS — N186 End stage renal disease: Secondary | ICD-10-CM | POA: Diagnosis not present

## 2016-06-14 DIAGNOSIS — M5442 Lumbago with sciatica, left side: Secondary | ICD-10-CM | POA: Diagnosis not present

## 2016-06-14 DIAGNOSIS — I9589 Other hypotension: Secondary | ICD-10-CM | POA: Diagnosis not present

## 2016-06-14 NOTE — Telephone Encounter (Signed)
Verbal order for once a week for 2 weeks for cognition , referral to GI for swallowing issues. Her BP today was 153/92. She will be checked tomorrow at dialysis

## 2016-06-15 ENCOUNTER — Other Ambulatory Visit: Payer: Self-pay | Admitting: Family Medicine

## 2016-06-15 DIAGNOSIS — D631 Anemia in chronic kidney disease: Secondary | ICD-10-CM | POA: Diagnosis not present

## 2016-06-15 DIAGNOSIS — I9589 Other hypotension: Secondary | ICD-10-CM | POA: Diagnosis not present

## 2016-06-15 DIAGNOSIS — R1310 Dysphagia, unspecified: Secondary | ICD-10-CM | POA: Insufficient documentation

## 2016-06-15 DIAGNOSIS — K219 Gastro-esophageal reflux disease without esophagitis: Secondary | ICD-10-CM | POA: Diagnosis not present

## 2016-06-15 DIAGNOSIS — M5441 Lumbago with sciatica, right side: Secondary | ICD-10-CM | POA: Diagnosis not present

## 2016-06-15 DIAGNOSIS — N2581 Secondary hyperparathyroidism of renal origin: Secondary | ICD-10-CM | POA: Diagnosis not present

## 2016-06-15 DIAGNOSIS — N186 End stage renal disease: Secondary | ICD-10-CM | POA: Diagnosis not present

## 2016-06-15 DIAGNOSIS — Z8673 Personal history of transient ischemic attack (TIA), and cerebral infarction without residual deficits: Secondary | ICD-10-CM | POA: Diagnosis not present

## 2016-06-15 DIAGNOSIS — D509 Iron deficiency anemia, unspecified: Secondary | ICD-10-CM | POA: Diagnosis not present

## 2016-06-15 DIAGNOSIS — M5442 Lumbago with sciatica, left side: Secondary | ICD-10-CM | POA: Diagnosis not present

## 2016-06-15 NOTE — Telephone Encounter (Signed)
Patient will need to have Megace refilled by gynecology

## 2016-06-15 NOTE — Addendum Note (Signed)
Addended by: Lockie Pares on: 06/15/2016 08:44 AM   Modules accepted: Orders

## 2016-06-15 NOTE — Telephone Encounter (Signed)
Please provide verbal orders. I will send in a referral to GI. Please let patient know.

## 2016-06-18 DIAGNOSIS — D509 Iron deficiency anemia, unspecified: Secondary | ICD-10-CM | POA: Diagnosis not present

## 2016-06-18 DIAGNOSIS — N2581 Secondary hyperparathyroidism of renal origin: Secondary | ICD-10-CM | POA: Diagnosis not present

## 2016-06-18 DIAGNOSIS — D631 Anemia in chronic kidney disease: Secondary | ICD-10-CM | POA: Diagnosis not present

## 2016-06-18 DIAGNOSIS — N186 End stage renal disease: Secondary | ICD-10-CM | POA: Diagnosis not present

## 2016-06-19 DIAGNOSIS — M5441 Lumbago with sciatica, right side: Secondary | ICD-10-CM | POA: Diagnosis not present

## 2016-06-19 DIAGNOSIS — Z8673 Personal history of transient ischemic attack (TIA), and cerebral infarction without residual deficits: Secondary | ICD-10-CM | POA: Diagnosis not present

## 2016-06-19 DIAGNOSIS — K219 Gastro-esophageal reflux disease without esophagitis: Secondary | ICD-10-CM | POA: Diagnosis not present

## 2016-06-19 DIAGNOSIS — I9589 Other hypotension: Secondary | ICD-10-CM | POA: Diagnosis not present

## 2016-06-19 DIAGNOSIS — M5442 Lumbago with sciatica, left side: Secondary | ICD-10-CM | POA: Diagnosis not present

## 2016-06-19 DIAGNOSIS — N186 End stage renal disease: Secondary | ICD-10-CM | POA: Diagnosis not present

## 2016-06-20 DIAGNOSIS — N2581 Secondary hyperparathyroidism of renal origin: Secondary | ICD-10-CM | POA: Diagnosis not present

## 2016-06-20 DIAGNOSIS — D631 Anemia in chronic kidney disease: Secondary | ICD-10-CM | POA: Diagnosis not present

## 2016-06-20 DIAGNOSIS — D509 Iron deficiency anemia, unspecified: Secondary | ICD-10-CM | POA: Diagnosis not present

## 2016-06-20 DIAGNOSIS — N186 End stage renal disease: Secondary | ICD-10-CM | POA: Diagnosis not present

## 2016-06-21 DIAGNOSIS — K219 Gastro-esophageal reflux disease without esophagitis: Secondary | ICD-10-CM | POA: Diagnosis not present

## 2016-06-21 DIAGNOSIS — Z8673 Personal history of transient ischemic attack (TIA), and cerebral infarction without residual deficits: Secondary | ICD-10-CM | POA: Diagnosis not present

## 2016-06-21 DIAGNOSIS — I9589 Other hypotension: Secondary | ICD-10-CM | POA: Diagnosis not present

## 2016-06-21 DIAGNOSIS — M5441 Lumbago with sciatica, right side: Secondary | ICD-10-CM | POA: Diagnosis not present

## 2016-06-21 DIAGNOSIS — N186 End stage renal disease: Secondary | ICD-10-CM | POA: Diagnosis not present

## 2016-06-21 DIAGNOSIS — M5442 Lumbago with sciatica, left side: Secondary | ICD-10-CM | POA: Diagnosis not present

## 2016-06-21 NOTE — Telephone Encounter (Signed)
Left voicemail for Ebony Hail giving verbal orders.

## 2016-06-22 DIAGNOSIS — D631 Anemia in chronic kidney disease: Secondary | ICD-10-CM | POA: Diagnosis not present

## 2016-06-22 DIAGNOSIS — N186 End stage renal disease: Secondary | ICD-10-CM | POA: Diagnosis not present

## 2016-06-22 DIAGNOSIS — D509 Iron deficiency anemia, unspecified: Secondary | ICD-10-CM | POA: Diagnosis not present

## 2016-06-22 DIAGNOSIS — N2581 Secondary hyperparathyroidism of renal origin: Secondary | ICD-10-CM | POA: Diagnosis not present

## 2016-06-25 ENCOUNTER — Other Ambulatory Visit: Payer: Self-pay

## 2016-06-25 DIAGNOSIS — N039 Chronic nephritic syndrome with unspecified morphologic changes: Secondary | ICD-10-CM | POA: Diagnosis not present

## 2016-06-25 DIAGNOSIS — Z992 Dependence on renal dialysis: Secondary | ICD-10-CM | POA: Diagnosis not present

## 2016-06-25 DIAGNOSIS — N186 End stage renal disease: Secondary | ICD-10-CM | POA: Diagnosis not present

## 2016-06-25 DIAGNOSIS — D509 Iron deficiency anemia, unspecified: Secondary | ICD-10-CM | POA: Diagnosis not present

## 2016-06-25 DIAGNOSIS — D631 Anemia in chronic kidney disease: Secondary | ICD-10-CM | POA: Diagnosis not present

## 2016-06-25 DIAGNOSIS — N2581 Secondary hyperparathyroidism of renal origin: Secondary | ICD-10-CM | POA: Diagnosis not present

## 2016-06-25 NOTE — Patient Outreach (Signed)
   Patient has met all her case management goals. Case closure completed.  Plan: Discharge patient from my caseload

## 2016-06-26 ENCOUNTER — Telehealth: Payer: Self-pay | Admitting: Internal Medicine

## 2016-06-26 ENCOUNTER — Ambulatory Visit (INDEPENDENT_AMBULATORY_CARE_PROVIDER_SITE_OTHER): Payer: Medicare Other | Admitting: Vascular Surgery

## 2016-06-26 ENCOUNTER — Encounter (INDEPENDENT_AMBULATORY_CARE_PROVIDER_SITE_OTHER): Payer: Medicare Other

## 2016-06-26 DIAGNOSIS — M5442 Lumbago with sciatica, left side: Secondary | ICD-10-CM | POA: Diagnosis not present

## 2016-06-26 DIAGNOSIS — K219 Gastro-esophageal reflux disease without esophagitis: Secondary | ICD-10-CM | POA: Diagnosis not present

## 2016-06-26 DIAGNOSIS — N186 End stage renal disease: Secondary | ICD-10-CM | POA: Diagnosis not present

## 2016-06-26 DIAGNOSIS — M5441 Lumbago with sciatica, right side: Secondary | ICD-10-CM | POA: Diagnosis not present

## 2016-06-26 DIAGNOSIS — I9589 Other hypotension: Secondary | ICD-10-CM | POA: Diagnosis not present

## 2016-06-26 DIAGNOSIS — Z8673 Personal history of transient ischemic attack (TIA), and cerebral infarction without residual deficits: Secondary | ICD-10-CM | POA: Diagnosis not present

## 2016-06-26 NOTE — Telephone Encounter (Signed)
Thank you for letting me know. If patient has any concerns. Please have her make an appointment

## 2016-06-26 NOTE — Telephone Encounter (Signed)
[name] Marks from [facility] called and wanted the doctor to know about a situation that is going on with the patient.She said that the patient is experiencing some numbness and pain in her arms and hands. She also has the same in her legs but it usually goes away after getting up and moving around. Her BP today was 98/65. [name] Marks

## 2016-06-27 DIAGNOSIS — D631 Anemia in chronic kidney disease: Secondary | ICD-10-CM | POA: Diagnosis not present

## 2016-06-27 DIAGNOSIS — N186 End stage renal disease: Secondary | ICD-10-CM | POA: Diagnosis not present

## 2016-06-27 DIAGNOSIS — D509 Iron deficiency anemia, unspecified: Secondary | ICD-10-CM | POA: Diagnosis not present

## 2016-06-27 DIAGNOSIS — N2581 Secondary hyperparathyroidism of renal origin: Secondary | ICD-10-CM | POA: Diagnosis not present

## 2016-06-27 NOTE — Telephone Encounter (Signed)
Called pt and scheduled her for an appt. [name] Marks Kennon Holter, CMA

## 2016-06-28 ENCOUNTER — Ambulatory Visit (HOSPITAL_COMMUNITY)
Admit: 2016-06-28 | Discharge: 2016-06-28 | Disposition: A | Discharge status: Home / Self Care | Payer: Medicare Other | Attending: Vascular Surgery | Admitting: Vascular Surgery

## 2016-06-28 DIAGNOSIS — N186 End stage renal disease: Secondary | ICD-10-CM | POA: Insufficient documentation

## 2016-06-28 DIAGNOSIS — K219 Gastro-esophageal reflux disease without esophagitis: Secondary | ICD-10-CM | POA: Diagnosis not present

## 2016-06-28 DIAGNOSIS — Z8673 Personal history of transient ischemic attack (TIA), and cerebral infarction without residual deficits: Secondary | ICD-10-CM | POA: Diagnosis not present

## 2016-06-28 DIAGNOSIS — Z01810 Encounter for preprocedural cardiovascular examination: Secondary | ICD-10-CM

## 2016-06-28 DIAGNOSIS — M5442 Lumbago with sciatica, left side: Secondary | ICD-10-CM | POA: Diagnosis not present

## 2016-06-28 DIAGNOSIS — M5441 Lumbago with sciatica, right side: Secondary | ICD-10-CM | POA: Diagnosis not present

## 2016-06-28 DIAGNOSIS — I9589 Other hypotension: Secondary | ICD-10-CM | POA: Diagnosis not present

## 2016-06-29 DIAGNOSIS — N2581 Secondary hyperparathyroidism of renal origin: Secondary | ICD-10-CM | POA: Diagnosis not present

## 2016-06-29 DIAGNOSIS — D631 Anemia in chronic kidney disease: Secondary | ICD-10-CM | POA: Diagnosis not present

## 2016-06-29 DIAGNOSIS — D509 Iron deficiency anemia, unspecified: Secondary | ICD-10-CM | POA: Diagnosis not present

## 2016-06-29 DIAGNOSIS — N186 End stage renal disease: Secondary | ICD-10-CM | POA: Diagnosis not present

## 2016-07-02 DIAGNOSIS — D509 Iron deficiency anemia, unspecified: Secondary | ICD-10-CM | POA: Diagnosis not present

## 2016-07-02 DIAGNOSIS — D631 Anemia in chronic kidney disease: Secondary | ICD-10-CM | POA: Diagnosis not present

## 2016-07-02 DIAGNOSIS — N186 End stage renal disease: Secondary | ICD-10-CM | POA: Diagnosis not present

## 2016-07-02 DIAGNOSIS — N2581 Secondary hyperparathyroidism of renal origin: Secondary | ICD-10-CM | POA: Diagnosis not present

## 2016-07-03 DIAGNOSIS — K219 Gastro-esophageal reflux disease without esophagitis: Secondary | ICD-10-CM | POA: Diagnosis not present

## 2016-07-03 DIAGNOSIS — M5442 Lumbago with sciatica, left side: Secondary | ICD-10-CM | POA: Diagnosis not present

## 2016-07-03 DIAGNOSIS — Z8673 Personal history of transient ischemic attack (TIA), and cerebral infarction without residual deficits: Secondary | ICD-10-CM | POA: Diagnosis not present

## 2016-07-03 DIAGNOSIS — N186 End stage renal disease: Secondary | ICD-10-CM | POA: Diagnosis not present

## 2016-07-03 DIAGNOSIS — M5441 Lumbago with sciatica, right side: Secondary | ICD-10-CM | POA: Diagnosis not present

## 2016-07-03 DIAGNOSIS — I9589 Other hypotension: Secondary | ICD-10-CM | POA: Diagnosis not present

## 2016-07-04 DIAGNOSIS — N2581 Secondary hyperparathyroidism of renal origin: Secondary | ICD-10-CM | POA: Diagnosis not present

## 2016-07-04 DIAGNOSIS — D631 Anemia in chronic kidney disease: Secondary | ICD-10-CM | POA: Diagnosis not present

## 2016-07-04 DIAGNOSIS — N186 End stage renal disease: Secondary | ICD-10-CM | POA: Diagnosis not present

## 2016-07-04 DIAGNOSIS — D509 Iron deficiency anemia, unspecified: Secondary | ICD-10-CM | POA: Diagnosis not present

## 2016-07-05 ENCOUNTER — Encounter: Payer: Self-pay | Admitting: Family Medicine

## 2016-07-05 ENCOUNTER — Ambulatory Visit (INDEPENDENT_AMBULATORY_CARE_PROVIDER_SITE_OTHER): Payer: Medicare Other | Admitting: Family Medicine

## 2016-07-05 DIAGNOSIS — G5602 Carpal tunnel syndrome, left upper limb: Secondary | ICD-10-CM | POA: Diagnosis not present

## 2016-07-05 DIAGNOSIS — G5600 Carpal tunnel syndrome, unspecified upper limb: Secondary | ICD-10-CM | POA: Insufficient documentation

## 2016-07-05 DIAGNOSIS — I639 Cerebral infarction, unspecified: Secondary | ICD-10-CM | POA: Diagnosis present

## 2016-07-05 NOTE — Assessment & Plan Note (Signed)
Exam consistent with carpal tunnel syndrome Will refer to OT at hand center for cock up wrist splint to sleep in F/u as needed Can consider steroid injection

## 2016-07-05 NOTE — Progress Notes (Signed)
   Subjective:   [name] Marks is a 61 y.o. female with a history of CVA, GERD, ESRD on HD, aortic valve replacement here for  Chief Complaint  Patient presents with  . Numbness in hands     Patient reports numbness and tingling Intermittently in L hand x1 month.  7/10 pain worse in thumb. Susie worse when laying down. Patient is left-hand-dominant. Ibuprofen seems to help sometimes. The pain does not radiate. Seems to last for about 15 minutes at a time and occurs daily. She denies any weakness until yesterday when she dropped a cup from her left hand. Denies neck pain, chest pain, shortness breath, vision changes.  Review of Systems:  Per HPI.   Social History: Never smoker  Objective:  BP (!) 100/56   Pulse 89   Temp 98 F (36.7 C) (Oral)   Ht 5' (1.524 m)   Wt 177 lb (80.3 kg)   BMI 34.57 kg/m   Gen:  61 y.o. female in NAD HEENT: NCAT, MMM, anicteric sclerae CV: RRR, no MRG Resp: Non-labored, CTAB, no wheezes noted Ext: WWP, no edema MSK: Left wrist: Range of motion is intact actively and passively. Sensation is intact in hands, wrists, fingers. Radial pulse intact. Phalen's and Tinel's special testing elicits paresthesias in fingers of left hand., Upper extremity strength intact bilaterally Neuro: Alert and oriented, speech normal      Assessment & Plan:     Glorianna A Marks is a 61 y.o. female here for   Carpal tunnel syndrome Exam consistent with carpal tunnel syndrome Will refer to OT at hand center for cock up wrist splint to sleep in F/u as needed Can consider steroid injection   Virginia Crews, MD MPH PGY-3,  [place] [facility] 07/05/2016  2:44 PM

## 2016-07-05 NOTE — Patient Instructions (Signed)
Carpal Tunnel Syndrome Carpal tunnel syndrome is a condition that causes pain in your hand and arm. The carpal tunnel is a narrow area located on the palm side of your wrist. Repeated wrist motion or certain diseases may cause swelling within the tunnel. This swelling pinches the main nerve in the wrist (median nerve). What are the causes? This condition may be caused by:  Repeated wrist motions.  Wrist injuries.  Arthritis.  A cyst or tumor in the carpal tunnel.  Fluid buildup during pregnancy.  Sometimes the cause of this condition is not known. What increases the risk? This condition is more likely to develop in:  People who have jobs that cause them to repeatedly move their wrists in the same motion, such as butchers and cashiers.  Women.  People with certain conditions, such as: ? Diabetes. ? Obesity. ? An underactive thyroid (hypothyroidism). ? Kidney failure.  What are the signs or symptoms? Symptoms of this condition include:  A tingling feeling in your fingers, especially in your thumb, index, and middle fingers.  Tingling or numbness in your hand.  An aching feeling in your entire arm, especially when your wrist and elbow are bent for long periods of time.  Wrist pain that goes up your arm to your shoulder.  Pain that goes down into your palm or fingers.  A weak feeling in your hands. You may have trouble grabbing and holding items.  Your symptoms may feel worse during the night. How is this diagnosed? This condition is diagnosed with a medical history and physical exam. You may also have tests, including:  An electromyogram (EMG). This test measures electrical signals sent by your nerves into the muscles.  X-rays.  How is this treated? Treatment for this condition includes:  Lifestyle changes. It is important to stop doing or modify the activity that caused your condition.  Physical or occupational therapy.  Medicines for pain and inflammation.  This may include medicine that is injected into your wrist.  A wrist splint.  Surgery.  Follow these instructions at home: If you have a splint:  Wear it as told by your health care provider. Remove it only as told by your health care provider.  Loosen the splint if your fingers become numb and tingle, or if they turn cold and blue.  Keep the splint clean and dry. General instructions  Take over-the-counter and prescription medicines only as told by your health care provider.  Rest your wrist from any activity that may be causing your pain. If your condition is work related, talk to your employer about changes that can be made, such as getting a wrist pad to use while typing.  If directed, apply ice to the painful area: ? Put ice in a plastic bag. ? Place a towel between your skin and the bag. ? Leave the ice on for 20 minutes, 2-3 times per day.  Keep all follow-up visits as told by your health care provider. This is important.  Do any exercises as told by your health care provider, physical therapist, or occupational therapist. Contact a health care provider if:  You have new symptoms.  Your pain is not controlled with medicines.  Your symptoms get worse. This information is not intended to replace advice given to you by your health care provider. Make sure you discuss any questions you have with your health care provider. Document Released: 02/10/2000 Document Revised: 06/23/2015 Document Reviewed: 06/30/2014 Elsevier Interactive Patient Education  2017 [employer]  

## 2016-07-06 DIAGNOSIS — N186 End stage renal disease: Secondary | ICD-10-CM | POA: Diagnosis not present

## 2016-07-06 DIAGNOSIS — N2581 Secondary hyperparathyroidism of renal origin: Secondary | ICD-10-CM | POA: Diagnosis not present

## 2016-07-06 DIAGNOSIS — D509 Iron deficiency anemia, unspecified: Secondary | ICD-10-CM | POA: Diagnosis not present

## 2016-07-06 DIAGNOSIS — D631 Anemia in chronic kidney disease: Secondary | ICD-10-CM | POA: Diagnosis not present

## 2016-07-09 ENCOUNTER — Encounter: Payer: Self-pay | Admitting: Vascular Surgery

## 2016-07-09 DIAGNOSIS — D509 Iron deficiency anemia, unspecified: Secondary | ICD-10-CM | POA: Diagnosis not present

## 2016-07-09 DIAGNOSIS — D631 Anemia in chronic kidney disease: Secondary | ICD-10-CM | POA: Diagnosis not present

## 2016-07-09 DIAGNOSIS — N2581 Secondary hyperparathyroidism of renal origin: Secondary | ICD-10-CM | POA: Diagnosis not present

## 2016-07-09 DIAGNOSIS — N186 End stage renal disease: Secondary | ICD-10-CM | POA: Diagnosis not present

## 2016-07-10 ENCOUNTER — Other Ambulatory Visit: Payer: Self-pay | Admitting: Internal Medicine

## 2016-07-10 DIAGNOSIS — Z1231 Encounter for screening mammogram for malignant neoplasm of breast: Secondary | ICD-10-CM

## 2016-07-11 ENCOUNTER — Encounter: Payer: Self-pay | Admitting: Gynecology

## 2016-07-11 DIAGNOSIS — N2581 Secondary hyperparathyroidism of renal origin: Secondary | ICD-10-CM | POA: Diagnosis not present

## 2016-07-11 DIAGNOSIS — D631 Anemia in chronic kidney disease: Secondary | ICD-10-CM | POA: Diagnosis not present

## 2016-07-11 DIAGNOSIS — N186 End stage renal disease: Secondary | ICD-10-CM | POA: Diagnosis not present

## 2016-07-11 DIAGNOSIS — D509 Iron deficiency anemia, unspecified: Secondary | ICD-10-CM | POA: Diagnosis not present

## 2016-07-12 ENCOUNTER — Ambulatory Visit (INDEPENDENT_AMBULATORY_CARE_PROVIDER_SITE_OTHER): Payer: Medicare Other | Admitting: Cardiology

## 2016-07-12 ENCOUNTER — Encounter: Payer: Self-pay | Admitting: Cardiology

## 2016-07-12 VITALS — BP 110/56 | HR 89 | Ht 60.0 in | Wt 174.4 lb

## 2016-07-12 DIAGNOSIS — I359 Nonrheumatic aortic valve disorder, unspecified: Secondary | ICD-10-CM

## 2016-07-12 DIAGNOSIS — I272 Pulmonary hypertension, unspecified: Secondary | ICD-10-CM | POA: Insufficient documentation

## 2016-07-12 DIAGNOSIS — I639 Cerebral infarction, unspecified: Secondary | ICD-10-CM | POA: Diagnosis not present

## 2016-07-12 DIAGNOSIS — I38 Endocarditis, valve unspecified: Secondary | ICD-10-CM

## 2016-07-12 HISTORY — DX: Pulmonary hypertension, unspecified: I27.20

## 2016-07-12 HISTORY — DX: Nonrheumatic aortic valve disorder, unspecified: I35.9

## 2016-07-12 NOTE — Progress Notes (Signed)
Cardiology Office Note    Date:  07/12/2016   ID:  [name] Marks, DOB June 21, 1955, MRN [id number]  PCP:  Tonette Bihari, MD  Cardiologist:  Fransico Him, MD   Chief Complaint  Patient presents with  . Follow-up    AV endocarditis s/p AVR    History of Present Illness:  [name] Marks is a 61 y.o. female with a histoyr of ESRD on HD, GERD and gout who presented to the emergency room 02/15/2016 with altered mental status.   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  She underwent removal of infected grafts in right arm on 12/22.  She underwent TEE 12/27 showing aortic valve endocarditis with severe AI and widened pulse pressure with DBP < 6mmHg.  There was mild to moderate MR and TR and left pleural effusion with normal LVF EF 55-60%.  There were vegetations noted on the right and non coronary cusps which prolapse past the valve plane into the LVOT with severe AI.  The vena contracta was the length of the LVOT with holodiastolic AI and mild AS.  She underwent cardiac cath with normal coronary arteries and underwent bioprosthetic AVR.  She presented back to [facility] 05/17/2016 for expressive aphasia and LUE numbness that resolved on presentation.  Repeat echo 05/17/2016 in setting of TIA showed normal LVF with EF 55-60%, dilated RV and RV pressure overload, moderate TR and moderate pulmonary HTN with PASP 78mmHg.   She is doing well today. She denies any chest pain or pressure, SOB, DOE, PND, orthopnea, LE edema, dizziness, palpitations or syncope.    Past Medical History:  Diagnosis Date  . Complication of anesthesia    due to kidney disease  . End stage renal disease on dialysis ([place])   . GERD (gastroesophageal reflux disease)   . Gout     Past Surgical History:  Procedure Laterality Date  . AORTIC VALVE REPLACEMENT N/A 02/25/2016   Procedure: AORTIC VALVE REPLACEMENT (AVR) implanted with Magna Ease Aortic valve size 64mm;  Surgeon: Melrose Nakayama, MD;   Location: [place];  Service: Open Heart Surgery;  Laterality: N/A;  . [place] Right 02/17/2016   Procedure: REMOVAL OF TWO ARTERIOVENOUS GORETEX GRAFTS ([place]);  Surgeon: Angelia Mould, MD;  Location: [facility] OR;  Service: Vascular;  Laterality: Right;  . DG AV DIALYSIS GRAFT DECLOT OR    . DILATATION & CURETTAGE/HYSTEROSCOPY WITH TRUECLEAR N/A 11/06/2012   Procedure: DILATATION & CURETTAGE/HYSTEROSCOPY WITH TRUECLEAR;  Surgeon: Terrance Mass, MD;  Location: [place] [facility];  Service: Gynecology;  Laterality: N/A;  Truclear Resectoscopic Polypectomy   . INSERTION OF DIALYSIS CATHETER N/A 02/19/2016   Procedure: INSERTION OF Left Internal Jugular DIALYSIS CATHETER;  Surgeon: Angelia Mould, MD;  Location: [place];  Service: Vascular;  Laterality: N/A;  . PATCH ANGIOPLASTY Right 02/17/2016   Procedure: PATCH ANGIOPLASTY;  Surgeon: Angelia Mould, MD;  Location: [facility];  Service: Vascular;  Laterality: Right;  . PERIPHERAL VASCULAR CATHETERIZATION N/A 09/14/2014   Procedure: A/V Shuntogram/Fistulagram;  Surgeon: Katha Cabal, MD;  Location: [place] [facility];  Service: Cardiovascular;  Laterality: N/A;  . PERIPHERAL VASCULAR CATHETERIZATION N/A 09/14/2014   Procedure: A/V Shunt Intervention;  Surgeon: Katha Cabal, MD;  Location: [place] [facility];  Service: Cardiovascular;  Laterality: N/A;  . PERIPHERAL VASCULAR CATHETERIZATION Right 12/09/2014   Procedure: A/V Shuntogram/Fistulagram;  Surgeon: Algernon Huxley, MD;  Location: [place] [facility];  Service: Cardiovascular;  Laterality: Right;  .  PERIPHERAL VASCULAR CATHETERIZATION N/A 12/09/2014   Procedure: A/V Shunt Intervention;  Surgeon: Algernon Huxley, MD;  Location: [place] [facility];  Service: Cardiovascular;  Laterality: N/A;  . PERIPHERAL VASCULAR CATHETERIZATION Right 05/24/2015   Procedure: A/V Shuntogram;  Surgeon: Serafina Mitchell, MD;  Location: [place] [facility];  Service: Cardiovascular;  Laterality: Right;  .  PERIPHERAL VASCULAR CATHETERIZATION Right 05/24/2015   Procedure: Peripheral Vascular Balloon Angioplasty;  Surgeon: Serafina Mitchell, MD;  Location: [place] [facility];  Service: Cardiovascular;  Laterality: Right;  right arm shunt  . PERIPHERAL VASCULAR CATHETERIZATION N/A 06/13/2015   Procedure: A/V Shuntogram/Fistulagram;  Surgeon: Algernon Huxley, MD;  Location: [place] [facility];  Service: Cardiovascular;  Laterality: N/A;  . PERIPHERAL VASCULAR CATHETERIZATION N/A 06/13/2015   Procedure: A/V Shunt Intervention;  Surgeon: Algernon Huxley, MD;  Location: [place] [facility];  Service: Cardiovascular;  Laterality: N/A;  . TEE WITHOUT CARDIOVERSION N/A 02/22/2016   Procedure: TRANSESOPHAGEAL ECHOCARDIOGRAM (TEE);  Surgeon: Pixie Casino, MD;  Location: [facility] ENDOSCOPY;  Service: Cardiovascular;  Laterality: N/A;  . TEE WITHOUT CARDIOVERSION N/A 02/25/2016   Procedure: TRANSESOPHAGEAL ECHOCARDIOGRAM (TEE);  Surgeon: Melrose Nakayama, MD;  Location: [place];  Service: Open Heart Surgery;  Laterality: N/A;  . TUBAL LIGATION  1983    Current Medications: Current Meds  Medication Sig  . aspirin EC 325 MG EC tablet Take 1 tablet (325 mg total) by mouth daily.  . cinacalcet (SENSIPAR) 60 MG tablet Take 60 mg by mouth daily.  . famotidine (PEPCID) 20 MG tablet Take 20 mg by mouth daily.   . megestrol (MEGACE) 40 MG tablet Take 1 tablet (40 mg total) by mouth daily.  . midodrine (PROAMATINE) 10 MG tablet TAKE 1 TABLET (10 MG TOTAL) BY MOUTH 2 (TWO) TIMES DAILY AS NEEDED (30 MIN PRIOR TO HD).  . Nutritional Supplements (NEPRO) LIQD Take 237 mLs by mouth daily.  . sevelamer (RENVELA) 800 MG tablet Take 800 mg by mouth 3 (three) times daily with meals.     Allergies:   Contrast media [iodinated diagnostic agents] and Naproxen sodium   Social History   Social History  . Marital status: Single    Spouse name: N/A  . Number of children: N/A  . Years of education: N/A   Social History Main Topics  .  Smoking status: Never Smoker  . Smokeless tobacco: Never Used  . Alcohol use No  . Drug use: No  . Sexual activity: Yes    Birth control/ protection: Post-menopausal   Other Topics Concern  . None   Social History Narrative  . None     Family History:  The patient's family history includes Alcohol abuse in her father and mother; Breast cancer (age of onset: 50) in her sister; Cancer in her father; Diabetes in her mother; Heart disease in her mother; Hypertension in her mother, sister, and son; Kidney disease in her mother and sister.   ROS:   Please see the history of present illness.    ROS All other systems reviewed and are negative.  No flowsheet data found.     PHYSICAL EXAM:   VS:  BP (!) 110/56   Pulse 89   Ht 5' (1.524 m)   Wt 174 lb 6.4 oz (79.1 kg)   SpO2 98%   BMI 34.06 kg/m    GEN: Well nourished, well developed, in no acute distress  HEENT: normal  Neck: no JVD, carotid bruits, or masses Cardiac:  RRR; no murmurs, rubs, or gallops,no edema.  Intact distal pulses bilaterally.  Respiratory:  clear to auscultation bilaterally, normal work of breathing GI: soft, nontender, nondistended, + BS MS: no deformity or atrophy  Skin: warm and dry, no rash Neuro:  Alert and Oriented x 3, Strength and sensation are intact Psych: euthymic mood, full affect  Wt Readings from Last 3 Encounters:  07/12/16 174 lb 6.4 oz (79.1 kg)  07/05/16 177 lb (80.3 kg)  05/29/16 180 lb (81.6 kg)      Studies/Labs Reviewed:   EKG:  EKG is not ordered today.   Recent Labs: 02/26/2016: Magnesium 1.9 05/16/2016: ALT 13 05/17/2016: BUN <5; Creatinine, Ser 2.85; Hemoglobin 12.0; Platelets 178; Potassium 5.5; Sodium 136; TSH 2.809   Lipid Panel    Component Value Date/Time   CHOL 159 05/17/2016 0549   TRIG 70 05/17/2016 0549   HDL 72 05/17/2016 0549   CHOLHDL 2.2 05/17/2016 0549   VLDL 14 05/17/2016 0549   LDLCALC 73 05/17/2016 0549    Additional studies/ records that were  reviewed today include:  Hospital records    ASSESSMENT:    1. Other endocarditis, unspecified chronicity   2. AVD (aortic valve disease)   3. Pulmonary HTN ([place])      PLAN:  In order of problems listed above:  1. 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 2. AV endocarditis s/p bioprosthetic AVR.  Repeat echo showed stable bioprosthesis.  She will continue on ASA.  I did instruct her that she needs to let her dentist know if she needs any dental procedures or cleanings to get SBE prophylaxis. 3. Moderate pulmonary HTN on before echo at 34mmHg and now 81mmHg in march ? Etiology.  PAP normal at time of cath.      Medication Adjustments/Labs and Tests Ordered: Current medicines are reviewed at length with the patient today.  Concerns regarding medicines are outlined above.  Medication changes, Labs and Tests ordered today are listed in the Patient Instructions below.  There are no Patient Instructions on file for this visit.   Signed, Fransico Him, MD  07/12/2016 3:23 PM    [address], [place], [place]  [address] Phone: [phone]; Fax: [phone]

## 2016-07-12 NOTE — Patient Instructions (Signed)
Medication Instructions:  Your physician recommends that you continue on your current medications as directed. Please refer to the Current Medication list given to you today.   Labwork: None  Testing/Procedures: None  Follow-Up: Your physician wants you to follow-up in: 6 months with Dr. Theodosia Blender assistant. You will receive a reminder letter in the mail two months in advance. If you don't receive a letter, please call our office to schedule the follow-up appointment.   Your physician wants you to follow-up in: 1 year with Dr. Radford Pax. You will receive a reminder letter in the mail two months in advance. If you don't receive a letter, please call our office to schedule the follow-up appointment.   Any Other Special Instructions Will Be Listed Below (If Applicable).     If you need a refill on your cardiac medications before your next appointment, please call your pharmacy.

## 2016-07-13 ENCOUNTER — Encounter (HOSPITAL_COMMUNITY): Payer: Medicare Other

## 2016-07-13 ENCOUNTER — Ambulatory Visit: Payer: Medicare Other | Admitting: Vascular Surgery

## 2016-07-13 ENCOUNTER — Ambulatory Visit (HOSPITAL_COMMUNITY)
Admit: 2016-07-13 | Discharge: 2016-07-13 | Disposition: A | Discharge status: Home / Self Care | Payer: Medicare Other | Attending: Vascular Surgery | Admitting: Vascular Surgery

## 2016-07-13 DIAGNOSIS — D631 Anemia in chronic kidney disease: Secondary | ICD-10-CM | POA: Diagnosis not present

## 2016-07-13 DIAGNOSIS — Z01810 Encounter for preprocedural cardiovascular examination: Secondary | ICD-10-CM | POA: Insufficient documentation

## 2016-07-13 DIAGNOSIS — N186 End stage renal disease: Secondary | ICD-10-CM | POA: Diagnosis not present

## 2016-07-13 DIAGNOSIS — D509 Iron deficiency anemia, unspecified: Secondary | ICD-10-CM | POA: Diagnosis not present

## 2016-07-13 DIAGNOSIS — N2581 Secondary hyperparathyroidism of renal origin: Secondary | ICD-10-CM | POA: Diagnosis not present

## 2016-07-16 DIAGNOSIS — N186 End stage renal disease: Secondary | ICD-10-CM | POA: Diagnosis not present

## 2016-07-16 DIAGNOSIS — D509 Iron deficiency anemia, unspecified: Secondary | ICD-10-CM | POA: Diagnosis not present

## 2016-07-16 DIAGNOSIS — N2581 Secondary hyperparathyroidism of renal origin: Secondary | ICD-10-CM | POA: Diagnosis not present

## 2016-07-16 DIAGNOSIS — D631 Anemia in chronic kidney disease: Secondary | ICD-10-CM | POA: Diagnosis not present

## 2016-07-17 ENCOUNTER — Ambulatory Visit (HOSPITAL_COMMUNITY)
Admission: RE | Admit: 2016-07-17 | Discharge: 2016-07-17 | Disposition: A | Discharge status: Home / Self Care | Payer: Medicare Other | Source: Ambulatory Visit | Attending: Vascular Surgery | Admitting: Vascular Surgery

## 2016-07-17 DIAGNOSIS — N186 End stage renal disease: Secondary | ICD-10-CM | POA: Diagnosis not present

## 2016-07-17 DIAGNOSIS — Z01810 Encounter for preprocedural cardiovascular examination: Secondary | ICD-10-CM | POA: Diagnosis not present

## 2016-07-18 ENCOUNTER — Encounter: Payer: Self-pay | Admitting: Vascular Surgery

## 2016-07-18 DIAGNOSIS — N2581 Secondary hyperparathyroidism of renal origin: Secondary | ICD-10-CM | POA: Diagnosis not present

## 2016-07-18 DIAGNOSIS — N186 End stage renal disease: Secondary | ICD-10-CM | POA: Diagnosis not present

## 2016-07-18 DIAGNOSIS — D509 Iron deficiency anemia, unspecified: Secondary | ICD-10-CM | POA: Diagnosis not present

## 2016-07-18 DIAGNOSIS — D631 Anemia in chronic kidney disease: Secondary | ICD-10-CM | POA: Diagnosis not present

## 2016-07-20 ENCOUNTER — Ambulatory Visit: Payer: Medicare Other | Admitting: Vascular Surgery

## 2016-07-20 DIAGNOSIS — D509 Iron deficiency anemia, unspecified: Secondary | ICD-10-CM | POA: Diagnosis not present

## 2016-07-20 DIAGNOSIS — N2581 Secondary hyperparathyroidism of renal origin: Secondary | ICD-10-CM | POA: Diagnosis not present

## 2016-07-20 DIAGNOSIS — D631 Anemia in chronic kidney disease: Secondary | ICD-10-CM | POA: Diagnosis not present

## 2016-07-20 DIAGNOSIS — N186 End stage renal disease: Secondary | ICD-10-CM | POA: Diagnosis not present

## 2016-07-23 DIAGNOSIS — D631 Anemia in chronic kidney disease: Secondary | ICD-10-CM | POA: Diagnosis not present

## 2016-07-23 DIAGNOSIS — D509 Iron deficiency anemia, unspecified: Secondary | ICD-10-CM | POA: Diagnosis not present

## 2016-07-23 DIAGNOSIS — N2581 Secondary hyperparathyroidism of renal origin: Secondary | ICD-10-CM | POA: Diagnosis not present

## 2016-07-23 DIAGNOSIS — N186 End stage renal disease: Secondary | ICD-10-CM | POA: Diagnosis not present

## 2016-07-25 DIAGNOSIS — N2581 Secondary hyperparathyroidism of renal origin: Secondary | ICD-10-CM | POA: Diagnosis not present

## 2016-07-25 DIAGNOSIS — D631 Anemia in chronic kidney disease: Secondary | ICD-10-CM | POA: Diagnosis not present

## 2016-07-25 DIAGNOSIS — D509 Iron deficiency anemia, unspecified: Secondary | ICD-10-CM | POA: Diagnosis not present

## 2016-07-25 DIAGNOSIS — N186 End stage renal disease: Secondary | ICD-10-CM | POA: Diagnosis not present

## 2016-07-26 DIAGNOSIS — Z992 Dependence on renal dialysis: Secondary | ICD-10-CM | POA: Diagnosis not present

## 2016-07-26 DIAGNOSIS — M79641 Pain in right hand: Secondary | ICD-10-CM | POA: Diagnosis not present

## 2016-07-26 DIAGNOSIS — M79642 Pain in left hand: Secondary | ICD-10-CM | POA: Diagnosis not present

## 2016-07-26 DIAGNOSIS — N186 End stage renal disease: Secondary | ICD-10-CM | POA: Diagnosis not present

## 2016-07-26 DIAGNOSIS — N039 Chronic nephritic syndrome with unspecified morphologic changes: Secondary | ICD-10-CM | POA: Diagnosis not present

## 2016-07-27 ENCOUNTER — Ambulatory Visit (INDEPENDENT_AMBULATORY_CARE_PROVIDER_SITE_OTHER): Payer: Medicare Other | Admitting: Vascular Surgery

## 2016-07-27 ENCOUNTER — Encounter: Payer: Self-pay | Admitting: Vascular Surgery

## 2016-07-27 VITALS — BP 90/58 | HR 74 | Temp 97.5°F | Resp 16 | Ht 60.0 in | Wt 175.0 lb

## 2016-07-27 DIAGNOSIS — N2581 Secondary hyperparathyroidism of renal origin: Secondary | ICD-10-CM | POA: Diagnosis not present

## 2016-07-27 DIAGNOSIS — N186 End stage renal disease: Secondary | ICD-10-CM

## 2016-07-27 DIAGNOSIS — D509 Iron deficiency anemia, unspecified: Secondary | ICD-10-CM | POA: Diagnosis not present

## 2016-07-27 DIAGNOSIS — D631 Anemia in chronic kidney disease: Secondary | ICD-10-CM | POA: Diagnosis not present

## 2016-07-27 NOTE — Progress Notes (Signed)
Patient ID: [name] Marks, female   DOB: [birth year], 61 y.o.   MRN: [id number]  Reason for Consult: New Evaluation (possible thigh graft)   Referred by Tonette Bihari, MD  Subjective:     HPI:  [name] Marks is a 61 y.o. female with multiple previous accesses in her bilateral upper extremities. She also has a right lower extremity access that has failed in fact never worked. She currently dialyzes Monday was a 5 via left IJ tunneled catheter. She does have an infection of a right upper shoulder graft in the past as well as multiple catheter infections. She's never had surgery on her left leg. She walks with a walker due to her recent major surgery in December. This time she is not interested in surgery given her underlying weakness.  Past Medical History:  Diagnosis Date  . Complication of anesthesia    due to kidney disease  . End stage renal disease on dialysis ([place])   . GERD (gastroesophageal reflux disease)   . Gout    Family History  Problem Relation Age of Onset  . Diabetes Mother   . Hypertension Mother   . Heart disease Mother   . Alcohol abuse Mother   . Kidney disease Mother   . Cancer Father        COLON  . Alcohol abuse Father   . Breast cancer Sister 17  . Hypertension Sister   . Kidney disease Sister   . Hypertension Son    Past Surgical History:  Procedure Laterality Date  . AORTIC VALVE REPLACEMENT N/A 02/25/2016   Procedure: AORTIC VALVE REPLACEMENT (AVR) implanted with Magna Ease Aortic valve size 40mm;  Surgeon: Melrose Nakayama, MD;  Location: [facility];  Service: Open Heart Surgery;  Laterality: N/A;  . [place] Right 02/17/2016   Procedure: REMOVAL OF TWO ARTERIOVENOUS GORETEX GRAFTS ([place]);  Surgeon: Angelia Mould, MD;  Location: [facility] OR;  Service: Vascular;  Laterality: Right;  . DG AV DIALYSIS GRAFT DECLOT OR    . DILATATION & CURETTAGE/HYSTEROSCOPY WITH TRUECLEAR N/A 11/06/2012   Procedure: DILATATION & CURETTAGE/HYSTEROSCOPY  WITH TRUECLEAR;  Surgeon: Terrance Mass, MD;  Location: [facility] [place] [facility];  Service: Gynecology;  Laterality: N/A;  Truclear Resectoscopic Polypectomy   . INSERTION OF DIALYSIS CATHETER N/A 02/19/2016   Procedure: INSERTION OF Left Internal Jugular DIALYSIS CATHETER;  Surgeon: Angelia Mould, MD;  Location: [facility];  Service: Vascular;  Laterality: N/A;  . PATCH ANGIOPLASTY Right 02/17/2016   Procedure: PATCH ANGIOPLASTY;  Surgeon: Angelia Mould, MD;  Location: [facility];  Service: Vascular;  Laterality: Right;  . PERIPHERAL VASCULAR CATHETERIZATION N/A 09/14/2014   Procedure: A/V Shuntogram/Fistulagram;  Surgeon: Katha Cabal, MD;  Location: [place] [facility];  Service: Cardiovascular;  Laterality: N/A;  . PERIPHERAL VASCULAR CATHETERIZATION N/A 09/14/2014   Procedure: A/V Shunt Intervention;  Surgeon: Katha Cabal, MD;  Location: [place] [facility];  Service: Cardiovascular;  Laterality: N/A;  . PERIPHERAL VASCULAR CATHETERIZATION Right 12/09/2014   Procedure: A/V Shuntogram/Fistulagram;  Surgeon: Algernon Huxley, MD;  Location: [place] [facility];  Service: Cardiovascular;  Laterality: Right;  . PERIPHERAL VASCULAR CATHETERIZATION N/A 12/09/2014   Procedure: A/V Shunt Intervention;  Surgeon: Algernon Huxley, MD;  Location: [place] [facility];  Service: Cardiovascular;  Laterality: N/A;  . PERIPHERAL VASCULAR CATHETERIZATION Right 05/24/2015   Procedure: A/V Shuntogram;  Surgeon: Serafina Mitchell, MD;  Location: [place] [facility];  Service: Cardiovascular;  Laterality: Right;  .  PERIPHERAL VASCULAR CATHETERIZATION Right 05/24/2015   Procedure: Peripheral Vascular Balloon Angioplasty;  Surgeon: Serafina Mitchell, MD;  Location: [place] [facility];  Service: Cardiovascular;  Laterality: Right;  right arm shunt  . PERIPHERAL VASCULAR CATHETERIZATION N/A 06/13/2015   Procedure: A/V Shuntogram/Fistulagram;  Surgeon: Algernon Huxley, MD;  Location: [place] [facility];  Service: Cardiovascular;   Laterality: N/A;  . PERIPHERAL VASCULAR CATHETERIZATION N/A 06/13/2015   Procedure: A/V Shunt Intervention;  Surgeon: Algernon Huxley, MD;  Location: [place] [facility];  Service: Cardiovascular;  Laterality: N/A;  . TEE WITHOUT CARDIOVERSION N/A 02/22/2016   Procedure: TRANSESOPHAGEAL ECHOCARDIOGRAM (TEE);  Surgeon: Pixie Casino, MD;  Location[facility] ENDOSCOPY;  Service: Cardiovascular;  Laterality: N/A;  . TEE WITHOUT CARDIOVERSION N/A 02/25/2016   Procedure: TRANSESOPHAGEAL ECHOCARDIOGRAM (TEE);  Surgeon: Melrose Nakayama, MD;  Location: [place];  Service: Open Heart Surgery;  Laterality: N/A;  . TUBAL LIGATION  1983    Short Social History:  Social History  Substance Use Topics  . Smoking status: Never Smoker  . Smokeless tobacco: Never Used  . Alcohol use No    Allergies  Allergen Reactions  . Contrast Media [Iodinated Diagnostic Agents] Anaphylaxis  . Naproxen Sodium Itching    Current Outpatient Prescriptions  Medication Sig Dispense Refill  . aspirin EC 325 MG EC tablet Take 1 tablet (325 mg total) by mouth daily. 30 tablet 0  . cinacalcet (SENSIPAR) 60 MG tablet Take 60 mg by mouth daily.    . famotidine (PEPCID) 20 MG tablet Take 20 mg by mouth daily.     . megestrol (MEGACE) 40 MG tablet Take 1 tablet (40 mg total) by mouth daily. 30 tablet 0  . midodrine (PROAMATINE) 10 MG tablet TAKE 1 TABLET (10 MG TOTAL) BY MOUTH 2 (TWO) TIMES DAILY AS NEEDED (30 MIN PRIOR TO HD). 60 tablet 0  . Multiple Vitamins-Minerals (PRORENAL QD PO) Take by mouth.    . Nutritional Supplements (NEPRO) LIQD Take 237 mLs by mouth daily.    . sevelamer (RENVELA) 800 MG tablet Take 800 mg by mouth 3 (three) times daily with meals.      No current facility-administered medications for this visit.     Review of Systems  Constitutional:  Constitutional negative. Eyes: Eyes negative.  Respiratory: Respiratory negative.  Cardiovascular: Cardiovascular negative.  GI: Gastrointestinal negative.    Musculoskeletal: Positive for gait problem and joint pain.  Skin: Skin negative.  Neurological: Positive for focal weakness.  Psychiatric: Psychiatric negative.        Objective:  Objective   Vitals:   07/27/16 1628  BP: (!) 90/58  Pulse: 74  Resp: 16  Temp: 97.5 F (36.4 C)  SpO2: 98%  Weight: 175 lb (79.4 kg)  Height: 5' (1.524 m)   Body mass index is 34.18 kg/m.  Physical Exam  Constitutional: She is oriented to person, place, and time. She appears well-developed.  HENT:  Head: Normocephalic.  Cardiovascular: Normal rate.   Pulses:      Femoral pulses are 2+ on the right side, and 2+ on the left side. Pulmonary/Chest: Effort normal.  Abdominal: Soft.  Musculoskeletal: Normal range of motion. She exhibits no edema or deformity.  Neurological: She is alert and oriented to person, place, and time.  Skin: Skin is warm and dry.  Psychiatric: She has a normal mood and affect. Her behavior is normal. Judgment and thought content normal.    Data: ABIs of right 1.51 and left 1.57 triphasic waveforms  throughout.     Assessment/Plan:    61 year old female multiple previous bilateral upper extremity accesses. She is now is indicated for left femoral AV graft. We discussed the risk and benefits. At this time she feels too weak to proceed with surgery and she is unsure if she wants with graft. I told her this is going option I really have for permanent access at this time and she understands. She will continue dialysis via her left IJ tunnel catheter. She can call to schedule left femoral graft when she is ready.     Waynetta Sandy MD [facility]

## 2016-07-30 DIAGNOSIS — D631 Anemia in chronic kidney disease: Secondary | ICD-10-CM | POA: Diagnosis not present

## 2016-07-30 DIAGNOSIS — N186 End stage renal disease: Secondary | ICD-10-CM | POA: Diagnosis not present

## 2016-07-30 DIAGNOSIS — D509 Iron deficiency anemia, unspecified: Secondary | ICD-10-CM | POA: Diagnosis not present

## 2016-07-30 DIAGNOSIS — N2581 Secondary hyperparathyroidism of renal origin: Secondary | ICD-10-CM | POA: Diagnosis not present

## 2016-08-01 ENCOUNTER — Encounter: Payer: Self-pay | Admitting: Nephrology

## 2016-08-01 DIAGNOSIS — N186 End stage renal disease: Secondary | ICD-10-CM | POA: Diagnosis not present

## 2016-08-01 DIAGNOSIS — N2581 Secondary hyperparathyroidism of renal origin: Secondary | ICD-10-CM | POA: Diagnosis not present

## 2016-08-01 DIAGNOSIS — D509 Iron deficiency anemia, unspecified: Secondary | ICD-10-CM | POA: Diagnosis not present

## 2016-08-01 DIAGNOSIS — D631 Anemia in chronic kidney disease: Secondary | ICD-10-CM | POA: Diagnosis not present

## 2016-08-03 DIAGNOSIS — N2581 Secondary hyperparathyroidism of renal origin: Secondary | ICD-10-CM | POA: Diagnosis not present

## 2016-08-03 DIAGNOSIS — D631 Anemia in chronic kidney disease: Secondary | ICD-10-CM | POA: Diagnosis not present

## 2016-08-03 DIAGNOSIS — D509 Iron deficiency anemia, unspecified: Secondary | ICD-10-CM | POA: Diagnosis not present

## 2016-08-03 DIAGNOSIS — N186 End stage renal disease: Secondary | ICD-10-CM | POA: Diagnosis not present

## 2016-08-06 DIAGNOSIS — D631 Anemia in chronic kidney disease: Secondary | ICD-10-CM | POA: Diagnosis not present

## 2016-08-06 DIAGNOSIS — N186 End stage renal disease: Secondary | ICD-10-CM | POA: Diagnosis not present

## 2016-08-06 DIAGNOSIS — N2581 Secondary hyperparathyroidism of renal origin: Secondary | ICD-10-CM | POA: Diagnosis not present

## 2016-08-06 DIAGNOSIS — D509 Iron deficiency anemia, unspecified: Secondary | ICD-10-CM | POA: Diagnosis not present

## 2016-08-07 DIAGNOSIS — G6289 Other specified polyneuropathies: Secondary | ICD-10-CM | POA: Diagnosis not present

## 2016-08-07 DIAGNOSIS — G5603 Carpal tunnel syndrome, bilateral upper limbs: Secondary | ICD-10-CM | POA: Diagnosis not present

## 2016-08-08 DIAGNOSIS — D631 Anemia in chronic kidney disease: Secondary | ICD-10-CM | POA: Diagnosis not present

## 2016-08-08 DIAGNOSIS — N2581 Secondary hyperparathyroidism of renal origin: Secondary | ICD-10-CM | POA: Diagnosis not present

## 2016-08-08 DIAGNOSIS — D509 Iron deficiency anemia, unspecified: Secondary | ICD-10-CM | POA: Diagnosis not present

## 2016-08-08 DIAGNOSIS — N186 End stage renal disease: Secondary | ICD-10-CM | POA: Diagnosis not present

## 2016-08-09 ENCOUNTER — Ambulatory Visit
Admission: RE | Admit: 2016-08-09 | Discharge: 2016-08-09 | Disposition: A | Discharge status: Home / Self Care | Payer: Medicare Other | Source: Ambulatory Visit | Attending: Family Medicine | Admitting: Family Medicine

## 2016-08-09 DIAGNOSIS — Z1231 Encounter for screening mammogram for malignant neoplasm of breast: Secondary | ICD-10-CM

## 2016-08-09 DIAGNOSIS — M65311 Trigger thumb, right thumb: Secondary | ICD-10-CM | POA: Diagnosis not present

## 2016-08-09 DIAGNOSIS — M79641 Pain in right hand: Secondary | ICD-10-CM | POA: Diagnosis not present

## 2016-08-09 DIAGNOSIS — G5603 Carpal tunnel syndrome, bilateral upper limbs: Secondary | ICD-10-CM | POA: Insufficient documentation

## 2016-08-09 DIAGNOSIS — M79642 Pain in left hand: Secondary | ICD-10-CM | POA: Diagnosis not present

## 2016-08-10 DIAGNOSIS — N186 End stage renal disease: Secondary | ICD-10-CM | POA: Diagnosis not present

## 2016-08-10 DIAGNOSIS — D631 Anemia in chronic kidney disease: Secondary | ICD-10-CM | POA: Diagnosis not present

## 2016-08-10 DIAGNOSIS — N2581 Secondary hyperparathyroidism of renal origin: Secondary | ICD-10-CM | POA: Diagnosis not present

## 2016-08-10 DIAGNOSIS — D509 Iron deficiency anemia, unspecified: Secondary | ICD-10-CM | POA: Diagnosis not present

## 2016-08-13 DIAGNOSIS — D631 Anemia in chronic kidney disease: Secondary | ICD-10-CM | POA: Diagnosis not present

## 2016-08-13 DIAGNOSIS — N2581 Secondary hyperparathyroidism of renal origin: Secondary | ICD-10-CM | POA: Diagnosis not present

## 2016-08-13 DIAGNOSIS — N186 End stage renal disease: Secondary | ICD-10-CM | POA: Diagnosis not present

## 2016-08-13 DIAGNOSIS — D509 Iron deficiency anemia, unspecified: Secondary | ICD-10-CM | POA: Diagnosis not present

## 2016-08-15 DIAGNOSIS — N2581 Secondary hyperparathyroidism of renal origin: Secondary | ICD-10-CM | POA: Diagnosis not present

## 2016-08-15 DIAGNOSIS — N186 End stage renal disease: Secondary | ICD-10-CM | POA: Diagnosis not present

## 2016-08-15 DIAGNOSIS — D509 Iron deficiency anemia, unspecified: Secondary | ICD-10-CM | POA: Diagnosis not present

## 2016-08-15 DIAGNOSIS — D631 Anemia in chronic kidney disease: Secondary | ICD-10-CM | POA: Diagnosis not present

## 2016-08-17 DIAGNOSIS — N2581 Secondary hyperparathyroidism of renal origin: Secondary | ICD-10-CM | POA: Diagnosis not present

## 2016-08-17 DIAGNOSIS — N186 End stage renal disease: Secondary | ICD-10-CM | POA: Diagnosis not present

## 2016-08-17 DIAGNOSIS — D509 Iron deficiency anemia, unspecified: Secondary | ICD-10-CM | POA: Diagnosis not present

## 2016-08-17 DIAGNOSIS — D631 Anemia in chronic kidney disease: Secondary | ICD-10-CM | POA: Diagnosis not present

## 2016-08-20 DIAGNOSIS — N186 End stage renal disease: Secondary | ICD-10-CM | POA: Diagnosis not present

## 2016-08-20 DIAGNOSIS — D509 Iron deficiency anemia, unspecified: Secondary | ICD-10-CM | POA: Diagnosis not present

## 2016-08-20 DIAGNOSIS — N2581 Secondary hyperparathyroidism of renal origin: Secondary | ICD-10-CM | POA: Diagnosis not present

## 2016-08-20 DIAGNOSIS — D631 Anemia in chronic kidney disease: Secondary | ICD-10-CM | POA: Diagnosis not present

## 2016-08-22 DIAGNOSIS — N2581 Secondary hyperparathyroidism of renal origin: Secondary | ICD-10-CM | POA: Diagnosis not present

## 2016-08-22 DIAGNOSIS — N186 End stage renal disease: Secondary | ICD-10-CM | POA: Diagnosis not present

## 2016-08-22 DIAGNOSIS — D631 Anemia in chronic kidney disease: Secondary | ICD-10-CM | POA: Diagnosis not present

## 2016-08-22 DIAGNOSIS — D509 Iron deficiency anemia, unspecified: Secondary | ICD-10-CM | POA: Diagnosis not present

## 2016-08-24 DIAGNOSIS — N186 End stage renal disease: Secondary | ICD-10-CM | POA: Diagnosis not present

## 2016-08-24 DIAGNOSIS — D509 Iron deficiency anemia, unspecified: Secondary | ICD-10-CM | POA: Diagnosis not present

## 2016-08-24 DIAGNOSIS — N2581 Secondary hyperparathyroidism of renal origin: Secondary | ICD-10-CM | POA: Diagnosis not present

## 2016-08-24 DIAGNOSIS — D631 Anemia in chronic kidney disease: Secondary | ICD-10-CM | POA: Diagnosis not present

## 2016-08-25 DIAGNOSIS — N039 Chronic nephritic syndrome with unspecified morphologic changes: Secondary | ICD-10-CM | POA: Diagnosis not present

## 2016-08-25 DIAGNOSIS — Z992 Dependence on renal dialysis: Secondary | ICD-10-CM | POA: Diagnosis not present

## 2016-08-25 DIAGNOSIS — N186 End stage renal disease: Secondary | ICD-10-CM | POA: Diagnosis not present

## 2016-08-27 ENCOUNTER — Other Ambulatory Visit: Payer: Self-pay | Admitting: *Deleted

## 2016-08-27 DIAGNOSIS — D509 Iron deficiency anemia, unspecified: Secondary | ICD-10-CM | POA: Diagnosis not present

## 2016-08-27 DIAGNOSIS — D631 Anemia in chronic kidney disease: Secondary | ICD-10-CM | POA: Diagnosis not present

## 2016-08-27 DIAGNOSIS — N2581 Secondary hyperparathyroidism of renal origin: Secondary | ICD-10-CM | POA: Diagnosis not present

## 2016-08-27 DIAGNOSIS — N186 End stage renal disease: Secondary | ICD-10-CM | POA: Diagnosis not present

## 2016-08-27 MED ORDER — MIDODRINE HCL 10 MG PO TABS: 10.0000 mg | ORAL_TABLET | Freq: Two times a day (BID) | ORAL | 0 refills | Status: DC | PRN

## 2016-08-29 DIAGNOSIS — D631 Anemia in chronic kidney disease: Secondary | ICD-10-CM | POA: Diagnosis not present

## 2016-08-29 DIAGNOSIS — N2581 Secondary hyperparathyroidism of renal origin: Secondary | ICD-10-CM | POA: Diagnosis not present

## 2016-08-29 DIAGNOSIS — D509 Iron deficiency anemia, unspecified: Secondary | ICD-10-CM | POA: Diagnosis not present

## 2016-08-29 DIAGNOSIS — N186 End stage renal disease: Secondary | ICD-10-CM | POA: Diagnosis not present

## 2016-08-31 DIAGNOSIS — D631 Anemia in chronic kidney disease: Secondary | ICD-10-CM | POA: Diagnosis not present

## 2016-08-31 DIAGNOSIS — N2581 Secondary hyperparathyroidism of renal origin: Secondary | ICD-10-CM | POA: Diagnosis not present

## 2016-08-31 DIAGNOSIS — N186 End stage renal disease: Secondary | ICD-10-CM | POA: Diagnosis not present

## 2016-08-31 DIAGNOSIS — D509 Iron deficiency anemia, unspecified: Secondary | ICD-10-CM | POA: Diagnosis not present

## 2016-09-03 DIAGNOSIS — D631 Anemia in chronic kidney disease: Secondary | ICD-10-CM | POA: Diagnosis not present

## 2016-09-03 DIAGNOSIS — D509 Iron deficiency anemia, unspecified: Secondary | ICD-10-CM | POA: Diagnosis not present

## 2016-09-03 DIAGNOSIS — N2581 Secondary hyperparathyroidism of renal origin: Secondary | ICD-10-CM | POA: Diagnosis not present

## 2016-09-03 DIAGNOSIS — N186 End stage renal disease: Secondary | ICD-10-CM | POA: Diagnosis not present

## 2016-09-05 DIAGNOSIS — N186 End stage renal disease: Secondary | ICD-10-CM | POA: Diagnosis not present

## 2016-09-05 DIAGNOSIS — D631 Anemia in chronic kidney disease: Secondary | ICD-10-CM | POA: Diagnosis not present

## 2016-09-05 DIAGNOSIS — N2581 Secondary hyperparathyroidism of renal origin: Secondary | ICD-10-CM | POA: Diagnosis not present

## 2016-09-05 DIAGNOSIS — D509 Iron deficiency anemia, unspecified: Secondary | ICD-10-CM | POA: Diagnosis not present

## 2016-09-07 DIAGNOSIS — N2581 Secondary hyperparathyroidism of renal origin: Secondary | ICD-10-CM | POA: Diagnosis not present

## 2016-09-07 DIAGNOSIS — D631 Anemia in chronic kidney disease: Secondary | ICD-10-CM | POA: Diagnosis not present

## 2016-09-07 DIAGNOSIS — N186 End stage renal disease: Secondary | ICD-10-CM | POA: Diagnosis not present

## 2016-09-07 DIAGNOSIS — D509 Iron deficiency anemia, unspecified: Secondary | ICD-10-CM | POA: Diagnosis not present

## 2016-09-10 DIAGNOSIS — N186 End stage renal disease: Secondary | ICD-10-CM | POA: Diagnosis not present

## 2016-09-10 DIAGNOSIS — D631 Anemia in chronic kidney disease: Secondary | ICD-10-CM | POA: Diagnosis not present

## 2016-09-10 DIAGNOSIS — D509 Iron deficiency anemia, unspecified: Secondary | ICD-10-CM | POA: Diagnosis not present

## 2016-09-10 DIAGNOSIS — N2581 Secondary hyperparathyroidism of renal origin: Secondary | ICD-10-CM | POA: Diagnosis not present

## 2016-09-12 DIAGNOSIS — D631 Anemia in chronic kidney disease: Secondary | ICD-10-CM | POA: Diagnosis not present

## 2016-09-12 DIAGNOSIS — N186 End stage renal disease: Secondary | ICD-10-CM | POA: Diagnosis not present

## 2016-09-12 DIAGNOSIS — N2581 Secondary hyperparathyroidism of renal origin: Secondary | ICD-10-CM | POA: Diagnosis not present

## 2016-09-12 DIAGNOSIS — D509 Iron deficiency anemia, unspecified: Secondary | ICD-10-CM | POA: Diagnosis not present

## 2016-09-14 DIAGNOSIS — D631 Anemia in chronic kidney disease: Secondary | ICD-10-CM | POA: Diagnosis not present

## 2016-09-14 DIAGNOSIS — D509 Iron deficiency anemia, unspecified: Secondary | ICD-10-CM | POA: Diagnosis not present

## 2016-09-14 DIAGNOSIS — N186 End stage renal disease: Secondary | ICD-10-CM | POA: Diagnosis not present

## 2016-09-14 DIAGNOSIS — N2581 Secondary hyperparathyroidism of renal origin: Secondary | ICD-10-CM | POA: Diagnosis not present

## 2016-09-17 DIAGNOSIS — N2581 Secondary hyperparathyroidism of renal origin: Secondary | ICD-10-CM | POA: Diagnosis not present

## 2016-09-17 DIAGNOSIS — N186 End stage renal disease: Secondary | ICD-10-CM | POA: Diagnosis not present

## 2016-09-17 DIAGNOSIS — D631 Anemia in chronic kidney disease: Secondary | ICD-10-CM | POA: Diagnosis not present

## 2016-09-17 DIAGNOSIS — D509 Iron deficiency anemia, unspecified: Secondary | ICD-10-CM | POA: Diagnosis not present

## 2016-09-19 DIAGNOSIS — D509 Iron deficiency anemia, unspecified: Secondary | ICD-10-CM | POA: Diagnosis not present

## 2016-09-19 DIAGNOSIS — N186 End stage renal disease: Secondary | ICD-10-CM | POA: Diagnosis not present

## 2016-09-19 DIAGNOSIS — N2581 Secondary hyperparathyroidism of renal origin: Secondary | ICD-10-CM | POA: Diagnosis not present

## 2016-09-19 DIAGNOSIS — D631 Anemia in chronic kidney disease: Secondary | ICD-10-CM | POA: Diagnosis not present

## 2016-09-20 ENCOUNTER — Encounter: Payer: Self-pay | Admitting: Gynecology

## 2016-09-20 ENCOUNTER — Ambulatory Visit (INDEPENDENT_AMBULATORY_CARE_PROVIDER_SITE_OTHER): Payer: Medicare Other | Admitting: Gynecology

## 2016-09-20 VITALS — BP 122/78 | Ht 61.0 in | Wt 178.0 lb

## 2016-09-20 DIAGNOSIS — Z780 Asymptomatic menopausal state: Secondary | ICD-10-CM

## 2016-09-20 DIAGNOSIS — Z01419 Encounter for gynecological examination (general) (routine) without abnormal findings: Secondary | ICD-10-CM | POA: Diagnosis not present

## 2016-09-20 NOTE — Patient Instructions (Signed)
Bone Densitometry Bone densitometry is an imaging test that uses a special X-ray to measure the amount of calcium and other minerals in your bones (bone density). This test is also known as a bone mineral density test or dual-energy X-ray absorptiometry (DXA). The test can measure bone density at your hip and your spine. It is similar to having a regular X-ray. You may have this test to:  Diagnose a condition that causes weak or thin bones (osteoporosis).  Predict your risk of a broken bone (fracture).  Determine how well osteoporosis treatment is working.  Tell a health care provider about:  Any allergies you have.  All medicines you are taking, including vitamins, herbs, eye drops, creams, and over-the-counter medicines.  Any problems you or family members have had with anesthetic medicines.  Any blood disorders you have.  Any surgeries you have had.  Any medical conditions you have.  Possibility of pregnancy.  Any other medical test you had within the previous 14 days that used contrast material. What are the risks? Generally, this is a safe procedure. However, problems can occur and may include the following:  This test exposes you to a very small amount of radiation.  The risks of radiation exposure may be greater to unborn children.  What happens before the procedure?  Do not take any calcium supplements for 24 hours before having the test. You can otherwise eat and drink what you usually do.  Take off all metal jewelry, eyeglasses, dental appliances, and any other metal objects. What happens during the procedure?  You may lie on an exam table. There will be an X-ray generator below you and an imaging device above you.  Other devices, such as boxes or braces, may be used to position your body properly for the scan.  You will need to lie still while the machine slowly scans your body.  The images will show up on a computer monitor. What happens after the  procedure? You may need more testing at a later time. This information is not intended to replace advice given to you by your health care provider. Make sure you discuss any questions you have with your health care provider. Document Released: 03/06/2004 Document Revised: 07/21/2015 Document Reviewed: 07/23/2013 Elsevier Interactive Patient Education  2018 [employer].

## 2016-09-20 NOTE — Progress Notes (Signed)
[name] Marks [birth year] [id number]   History:    61 y.o.  for annual gyn exam with no complaints today. With past history of complex atypical hyperplasia treated with high-dose Megace as per recommendation GYN oncologist due to patient's multiple comorbidities not surgical candidate. Patient reports no vaginal bleeding no longer on Megace her last endometrial biopsy was December 2017 no hyperplasia was noted. See previous notes for additional details. Patient not sexually active. Patient in 2014 had resectoscopic polypectomy whereby the pathology report demonstrated benign endometrial polyp. Patient had a colonoscopy in 2016 benign polyps were removed she is on a 5 year recall. Patient has not had her bone density study. Patient has not been on any hormonal replacement therapy. Patient with no previous history of any abnormal Pap smears. Patient with multiple comorbidities see Epic past medical history list for details.  Past medical history,surgical history, family history and social history were all reviewed and documented in the EPIC chart.  Gynecologic History No LMP recorded. Patient is postmenopausal. Contraception: post menopausal status Last Pap: 2016. Results were: normal Last mammogram: 2018. Results were: normal  Obstetric History OB History  Gravida Para Term Preterm AB Living  2 2       2   SAB TAB Ectopic Multiple Live Births               # Outcome Date GA Lbr Len/2nd Weight Sex Delivery Anes PTL Lv  2 Para           1 Para                ROS: A ROS was performed and pertinent positives and negatives are included in the history.  GENERAL: No fevers or chills. HEENT: No change in vision, no earache, sore throat or sinus congestion. NECK: No pain or stiffness. CARDIOVASCULAR: No chest pain or pressure. No palpitations. PULMONARY: No shortness of breath, cough or wheeze. GASTROINTESTINAL: No abdominal pain, nausea, vomiting or diarrhea, melena or bright red blood per  rectum. GENITOURINARY: No urinary frequency, urgency, hesitancy or dysuria. MUSCULOSKELETAL: No joint or muscle pain, no back pain, no recent trauma. DERMATOLOGIC: No rash, no itching, no lesions. ENDOCRINE: No polyuria, polydipsia, no heat or cold intolerance. No recent change in weight. HEMATOLOGICAL: No anemia or easy bruising or bleeding. NEUROLOGIC: No headache, seizures, numbness, tingling or weakness. PSYCHIATRIC: No depression, no loss of interest in normal activity or change in sleep pattern.     Exam: chaperone present  BP 122/78 (Cuff Size: Large)   Ht 5\' 1"  (1.549 m)   Wt 178 lb (80.7 kg)   BMI 33.63 kg/m   Body mass index is 33.63 kg/m.  General appearance : Well developed well nourished female. No acute distress HEENT: Eyes: no retinal hemorrhage or exudates,  Neck supple, trachea midline, no carotid bruits, no thyroidmegaly Lungs: Clear to auscultation, no rhonchi or wheezes, or rib retractions  Heart: Regular rate and rhythm, no murmurs or gallops Breast:Examined in sitting and supine position were symmetrical in appearance, no palpable masses or tenderness,  no skin retraction, no nipple inversion, no nipple discharge, no skin discoloration, no axillary or supraclavicular lymphadenopathy Abdomen: no palpable masses or tenderness, no rebound or guarding Extremities: no edema or skin discoloration or tenderness  Pelvic:  Bartholin, Urethra, Skene Glands: Within normal limits             Vagina: No gross lesions or discharge  Cervix: No gross lesions or discharge  Uterus  anteverted, normal  size, shape and consistency, non-tender and mobile  Adnexa  Without masses or tenderness  Anus and perineum  normal   Rectovaginal  normal sphincter tone without palpated masses or tenderness             Hemoccult PCP provides     Assessment/Plan:  61 y.o. female for annual exam with multiple medical comorbidities last year had complex atypical hyperplasia was treated with  high-dose Megace follow-up endometrial biopsy December 2017 no hyperplasia or malignancy noted. Patient no longer on Megace reports no vaginal bleeding. Pap smear not indicated this year. PCP is been doing her blood work. We'll schedule for bone density study here in the office in the next few weeks.   Terrance Mass MD, 10:33 AM 09/20/2016

## 2016-09-21 DIAGNOSIS — N2581 Secondary hyperparathyroidism of renal origin: Secondary | ICD-10-CM | POA: Diagnosis not present

## 2016-09-21 DIAGNOSIS — D631 Anemia in chronic kidney disease: Secondary | ICD-10-CM | POA: Diagnosis not present

## 2016-09-21 DIAGNOSIS — N186 End stage renal disease: Secondary | ICD-10-CM | POA: Diagnosis not present

## 2016-09-21 DIAGNOSIS — D509 Iron deficiency anemia, unspecified: Secondary | ICD-10-CM | POA: Diagnosis not present

## 2016-09-24 DIAGNOSIS — D631 Anemia in chronic kidney disease: Secondary | ICD-10-CM | POA: Diagnosis not present

## 2016-09-24 DIAGNOSIS — D509 Iron deficiency anemia, unspecified: Secondary | ICD-10-CM | POA: Diagnosis not present

## 2016-09-24 DIAGNOSIS — N186 End stage renal disease: Secondary | ICD-10-CM | POA: Diagnosis not present

## 2016-09-24 DIAGNOSIS — N2581 Secondary hyperparathyroidism of renal origin: Secondary | ICD-10-CM | POA: Diagnosis not present

## 2016-09-25 DIAGNOSIS — N186 End stage renal disease: Secondary | ICD-10-CM | POA: Diagnosis not present

## 2016-09-25 DIAGNOSIS — Z992 Dependence on renal dialysis: Secondary | ICD-10-CM | POA: Diagnosis not present

## 2016-09-25 DIAGNOSIS — N039 Chronic nephritic syndrome with unspecified morphologic changes: Secondary | ICD-10-CM | POA: Diagnosis not present

## 2016-09-26 DIAGNOSIS — D631 Anemia in chronic kidney disease: Secondary | ICD-10-CM | POA: Diagnosis not present

## 2016-09-26 DIAGNOSIS — N2581 Secondary hyperparathyroidism of renal origin: Secondary | ICD-10-CM | POA: Diagnosis not present

## 2016-09-26 DIAGNOSIS — M810 Age-related osteoporosis without current pathological fracture: Secondary | ICD-10-CM

## 2016-09-26 DIAGNOSIS — N186 End stage renal disease: Secondary | ICD-10-CM | POA: Diagnosis not present

## 2016-09-26 DIAGNOSIS — Z23 Encounter for immunization: Secondary | ICD-10-CM | POA: Diagnosis not present

## 2016-09-26 DIAGNOSIS — D509 Iron deficiency anemia, unspecified: Secondary | ICD-10-CM | POA: Diagnosis not present

## 2016-09-26 HISTORY — DX: Age-related osteoporosis without current pathological fracture: M81.0

## 2016-09-28 ENCOUNTER — Emergency Department (HOSPITAL_COMMUNITY): Payer: Medicare Other

## 2016-09-28 ENCOUNTER — Observation Stay (HOSPITAL_COMMUNITY): Payer: Medicare Other

## 2016-09-28 ENCOUNTER — Encounter (HOSPITAL_COMMUNITY): Payer: Self-pay

## 2016-09-28 ENCOUNTER — Inpatient Hospital Stay (HOSPITAL_COMMUNITY)
Admission: EM | Admit: 2016-09-28 | Discharge: 2016-10-02 | DRG: 040 | Disposition: A | Discharge status: Home Health | Payer: Medicare Other | Attending: Family Medicine | Admitting: Family Medicine

## 2016-09-28 DIAGNOSIS — R4701 Aphasia: Secondary | ICD-10-CM | POA: Diagnosis present

## 2016-09-28 DIAGNOSIS — Q211 Atrial septal defect: Secondary | ICD-10-CM | POA: Diagnosis not present

## 2016-09-28 DIAGNOSIS — Z811 Family history of alcohol abuse and dependence: Secondary | ICD-10-CM

## 2016-09-28 DIAGNOSIS — R29810 Facial weakness: Secondary | ICD-10-CM | POA: Diagnosis present

## 2016-09-28 DIAGNOSIS — Z803 Family history of malignant neoplasm of breast: Secondary | ICD-10-CM

## 2016-09-28 DIAGNOSIS — R29818 Other symptoms and signs involving the nervous system: Secondary | ICD-10-CM | POA: Diagnosis not present

## 2016-09-28 DIAGNOSIS — D631 Anemia in chronic kidney disease: Secondary | ICD-10-CM | POA: Diagnosis not present

## 2016-09-28 DIAGNOSIS — R9431 Abnormal electrocardiogram [ECG] [EKG]: Secondary | ICD-10-CM | POA: Diagnosis not present

## 2016-09-28 DIAGNOSIS — G459 Transient cerebral ischemic attack, unspecified: Principal | ICD-10-CM

## 2016-09-28 DIAGNOSIS — K219 Gastro-esophageal reflux disease without esophagitis: Secondary | ICD-10-CM | POA: Diagnosis present

## 2016-09-28 DIAGNOSIS — Z886 Allergy status to analgesic agent status: Secondary | ICD-10-CM

## 2016-09-28 DIAGNOSIS — N2581 Secondary hyperparathyroidism of renal origin: Secondary | ICD-10-CM | POA: Diagnosis not present

## 2016-09-28 DIAGNOSIS — N186 End stage renal disease: Secondary | ICD-10-CM

## 2016-09-28 DIAGNOSIS — I120 Hypertensive chronic kidney disease with stage 5 chronic kidney disease or end stage renal disease: Secondary | ICD-10-CM | POA: Diagnosis not present

## 2016-09-28 DIAGNOSIS — E785 Hyperlipidemia, unspecified: Secondary | ICD-10-CM | POA: Diagnosis present

## 2016-09-28 DIAGNOSIS — Z833 Family history of diabetes mellitus: Secondary | ICD-10-CM

## 2016-09-28 DIAGNOSIS — Z841 Family history of disorders of kidney and ureter: Secondary | ICD-10-CM

## 2016-09-28 DIAGNOSIS — I272 Pulmonary hypertension, unspecified: Secondary | ICD-10-CM | POA: Diagnosis present

## 2016-09-28 DIAGNOSIS — Z8673 Personal history of transient ischemic attack (TIA), and cerebral infarction without residual deficits: Secondary | ICD-10-CM

## 2016-09-28 DIAGNOSIS — R531 Weakness: Secondary | ICD-10-CM | POA: Diagnosis not present

## 2016-09-28 DIAGNOSIS — R29704 NIHSS score 4: Secondary | ICD-10-CM | POA: Diagnosis present

## 2016-09-28 DIAGNOSIS — Z953 Presence of xenogenic heart valve: Secondary | ICD-10-CM

## 2016-09-28 DIAGNOSIS — Z91041 Radiographic dye allergy status: Secondary | ICD-10-CM

## 2016-09-28 DIAGNOSIS — Z8249 Family history of ischemic heart disease and other diseases of the circulatory system: Secondary | ICD-10-CM

## 2016-09-28 DIAGNOSIS — Z992 Dependence on renal dialysis: Secondary | ICD-10-CM | POA: Diagnosis not present

## 2016-09-28 DIAGNOSIS — Z7982 Long term (current) use of aspirin: Secondary | ICD-10-CM

## 2016-09-28 DIAGNOSIS — I6789 Other cerebrovascular disease: Secondary | ICD-10-CM | POA: Diagnosis not present

## 2016-09-28 DIAGNOSIS — Z952 Presence of prosthetic heart valve: Secondary | ICD-10-CM

## 2016-09-28 DIAGNOSIS — M898X9 Other specified disorders of bone, unspecified site: Secondary | ICD-10-CM | POA: Diagnosis present

## 2016-09-28 DIAGNOSIS — M109 Gout, unspecified: Secondary | ICD-10-CM | POA: Diagnosis present

## 2016-09-28 HISTORY — DX: Methicillin susceptible Staphylococcus aureus infection as the cause of diseases classified elsewhere: B95.61

## 2016-09-28 HISTORY — DX: Severe sepsis with septic shock: A41.9

## 2016-09-28 HISTORY — DX: Bacteremia: R78.81

## 2016-09-28 HISTORY — DX: Sepsis, unspecified organism: R65.21

## 2016-09-28 HISTORY — DX: Sepsis, unspecified organism: A41.9

## 2016-09-28 LAB — COMPREHENSIVE METABOLIC PANEL
ALBUMIN: 3.3 g/dL — AB (ref 3.5–5.0)
ALT: 23 U/L (ref 14–54)
ANION GAP: 10 (ref 5–15)
AST: 32 U/L (ref 15–41)
Alkaline Phosphatase: 239 U/L — ABNORMAL HIGH (ref 38–126)
BILIRUBIN TOTAL: 0.6 mg/dL (ref 0.3–1.2)
BUN: 24 mg/dL — ABNORMAL HIGH (ref 6–20)
CO2: 26 mmol/L (ref 22–32)
Calcium: 9.4 mg/dL (ref 8.9–10.3)
Chloride: 105 mmol/L (ref 101–111)
Creatinine, Ser: 6.25 mg/dL — ABNORMAL HIGH (ref 0.44–1.00)
GFR calc Af Amer: 7 mL/min — ABNORMAL LOW (ref >60)
GFR calc non Af Amer: 6 mL/min — ABNORMAL LOW (ref >60)
GLUCOSE: 88 mg/dL (ref 65–99)
POTASSIUM: 3.8 mmol/L (ref 3.5–5.1)
Sodium: 141 mmol/L (ref 135–145)
TOTAL PROTEIN: 6.7 g/dL (ref 6.5–8.1)

## 2016-09-28 LAB — ETHANOL: Alcohol, Ethyl (B): <5 mg/dL (ref <5)

## 2016-09-28 LAB — I-STAT CHEM 8, ED
BUN: 25 mg/dL — AB (ref 6–20)
CALCIUM ION: 1.12 mmol/L — AB (ref 1.15–1.40)
CHLORIDE: 104 mmol/L (ref 101–111)
Creatinine, Ser: 6.2 mg/dL — ABNORMAL HIGH (ref 0.44–1.00)
Glucose, Bld: 86 mg/dL (ref 65–99)
HEMATOCRIT: 34 % — AB (ref 36.0–46.0)
Hemoglobin: 11.6 g/dL — ABNORMAL LOW (ref 12.0–15.0)
POTASSIUM: 3.7 mmol/L (ref 3.5–5.1)
SODIUM: 142 mmol/L (ref 135–145)
TCO2: 27 mmol/L (ref 0–100)

## 2016-09-28 LAB — DIFFERENTIAL
Basophils Absolute: 0 10*3/uL (ref 0.0–0.1)
Basophils Relative: 0 %
EOS ABS: 0.2 10*3/uL (ref 0.0–0.7)
EOS PCT: 5 %
LYMPHS ABS: 2 10*3/uL (ref 0.7–4.0)
Lymphocytes Relative: 41 %
Monocytes Absolute: 0.3 10*3/uL (ref 0.1–1.0)
Monocytes Relative: 7 %
NEUTROS PCT: 47 %
Neutro Abs: 2.3 10*3/uL (ref 1.7–7.7)

## 2016-09-28 LAB — CBC
HCT: 33.7 % — ABNORMAL LOW (ref 36.0–46.0)
Hemoglobin: 11 g/dL — ABNORMAL LOW (ref 12.0–15.0)
MCH: 27.4 pg (ref 26.0–34.0)
MCHC: 32.6 g/dL (ref 30.0–36.0)
MCV: 83.8 fL (ref 78.0–100.0)
PLATELETS: 190 10*3/uL (ref 150–400)
RBC: 4.02 MIL/uL (ref 3.87–5.11)
RDW: 16.6 % — AB (ref 11.5–15.5)
WBC: 4.8 10*3/uL (ref 4.0–10.5)

## 2016-09-28 LAB — I-STAT TROPONIN, ED: TROPONIN I, POC: 0 ng/mL (ref 0.00–0.08)

## 2016-09-28 LAB — PROTIME-INR
INR: 0.96
PROTHROMBIN TIME: 12.8 s (ref 11.4–15.2)

## 2016-09-28 LAB — CBG MONITORING, ED: GLUCOSE-CAPILLARY: 81 mg/dL (ref 65–99)

## 2016-09-28 LAB — APTT: aPTT: 33 seconds (ref 24–36)

## 2016-09-28 LAB — MRSA PCR SCREENING: MRSA BY PCR: NEGATIVE

## 2016-09-28 MED ORDER — ASPIRIN 325 MG PO TABS
325.0000 mg | ORAL_TABLET | Freq: Once | ORAL | Status: AC
  Administered 2016-09-28: 325 mg via ORAL
  Filled 2016-09-28: qty 1

## 2016-09-28 MED ORDER — NEPRO/CARBSTEADY PO LIQD
237.0000 mL | Freq: Every day | ORAL | Status: DC
  Administered 2016-09-30 – 2016-10-01 (×2): 237 mL via ORAL

## 2016-09-28 MED ORDER — STROKE: EARLY STAGES OF RECOVERY BOOK
Freq: Once | Status: AC
  Administered 2016-09-30: 10:00:00
  Filled 2016-09-28: qty 1

## 2016-09-28 MED ORDER — ASPIRIN EC 325 MG PO TBEC: 325.0000 mg | DELAYED_RELEASE_TABLET | Freq: Every day | ORAL | Status: DC

## 2016-09-28 MED ORDER — MIDODRINE HCL 5 MG PO TABS
10.0000 mg | ORAL_TABLET | Freq: Two times a day (BID) | ORAL | Status: DC | PRN
  Administered 2016-10-01: 10 mg via ORAL

## 2016-09-28 MED ORDER — SENNOSIDES-DOCUSATE SODIUM 8.6-50 MG PO TABS
1.0000 | ORAL_TABLET | Freq: Every evening | ORAL | Status: DC | PRN
  Administered 2016-10-01: 1 via ORAL
  Filled 2016-09-28: qty 1

## 2016-09-28 MED ORDER — IOPAMIDOL (ISOVUE-370) INJECTION 76%
INTRAVENOUS | Status: AC
  Filled 2016-09-28: qty 100

## 2016-09-28 MED ORDER — HEPARIN SODIUM (PORCINE) 5000 UNIT/ML IJ SOLN
5000.0000 [IU] | Freq: Three times a day (TID) | INTRAMUSCULAR | Status: DC
  Administered 2016-09-28 – 2016-10-02 (×11): 5000 [IU] via SUBCUTANEOUS
  Filled 2016-09-28 (×10): qty 1

## 2016-09-28 MED ORDER — WITCH HAZEL-GLYCERIN EX PADS
MEDICATED_PAD | CUTANEOUS | Status: DC | PRN
Start: 2016-09-28 — End: 2016-10-02
  Filled 2016-09-28: qty 100

## 2016-09-28 MED ORDER — FAMOTIDINE 20 MG PO TABS
20.0000 mg | ORAL_TABLET | Freq: Every day | ORAL | Status: DC
  Administered 2016-09-29 – 2016-10-02 (×4): 20 mg via ORAL
  Filled 2016-09-28 (×4): qty 1

## 2016-09-28 MED ORDER — SEVELAMER CARBONATE 800 MG PO TABS: 800.0000 mg | ORAL_TABLET | Freq: Three times a day (TID) | ORAL | Status: DC

## 2016-09-28 MED ORDER — CINACALCET HCL 30 MG PO TABS
60.0000 mg | ORAL_TABLET | Freq: Every day | ORAL | Status: DC
  Administered 2016-09-29 – 2016-10-02 (×4): 60 mg via ORAL
  Filled 2016-09-28 (×4): qty 2

## 2016-09-28 MED ORDER — SEVELAMER CARBONATE 800 MG PO TABS
1600.0000 mg | ORAL_TABLET | Freq: Three times a day (TID) | ORAL | Status: DC
  Administered 2016-09-29 (×3): 1600 mg via ORAL
  Filled 2016-09-28 (×3): qty 2

## 2016-09-28 MED ORDER — NEPRO PO LIQD: 237.0000 mL | Freq: Every day | ORAL | Status: DC

## 2016-09-28 MED ORDER — ASPIRIN EC 325 MG PO TBEC
325.0000 mg | DELAYED_RELEASE_TABLET | Freq: Every day | ORAL | Status: DC
  Administered 2016-09-29: 325 mg via ORAL
  Filled 2016-09-28: qty 1

## 2016-09-28 NOTE — ED Notes (Signed)
This RN with patient in MRI

## 2016-09-28 NOTE — ED Provider Notes (Signed)
[facility] DEPT Provider Note   CSN: [id number] Arrival date & time: 09/28/16  6440     History   Chief Complaint Chief Complaint  Patient presents with  . Code Stroke    HPI [name] Marks is a 61 y.o. female who arrives as a code stroke. She has a pmh of ESRD on HD MWF. Dialyzed yesterday. The patient woke up at 5:00 AM in her normal state. Shortly after she had difficulty finding her words This is abnormal for her. She denies orther neurologic sxs. She denies fevers, chills, myalgias. She is on daily asa therapy. She has a hx of previous CVA.   HPI  Past Medical History:  Diagnosis Date  . Complication of anesthesia    due to kidney disease  . End stage renal disease on dialysis ([place])   . GERD (gastroesophageal reflux disease)   . Gout   . Septic shock ([place])   . Staphylococcus aureus bacteremia     Patient Active Problem List   Diagnosis Date Noted  . Transient cerebral ischemia   . AVD (aortic valve disease) 07/12/2016  . Pulmonary HTN ([place]) 07/12/2016  . Carpal tunnel syndrome 07/05/2016  . Swallowing difficulty 06/15/2016  . 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 Right leg pain 05/29/2016  . CVA (cerebral vascular accident) ([place]) 05/16/2016  . Aphasia   . History of aortic valve replacement with bioprosthetic valve 05/08/2016  . Aortic valve prosthesis present 02/25/2016  . Bacteremia due to coagulase-negative Staphylococcus   . S/P dialysis catheter insertion ([place])   . Bilateral low back pain with sciatica   . Neck pain   . Endocarditis   . Anemia   . Renal dialysis device, implant, or graft complication 34/74/2595  . Pain and swelling of right upper extremity 12/20/2015  . Complex endometrial hyperplasia with atypia 11/08/2015  . Increased endometrial stripe thickness 11/03/2015  . Obesity 10/28/2014  . Shoulder pain, bilateral 10/28/2014  . Health care maintenance 10/28/2014  . Right carotid bruit 01/08/2013  . Generalized headaches 01/08/2013    . Chronic female pelvic pain 08/08/2012  . Gout, unspecified 12/06/2008  . Esophageal reflux 12/06/2008  . BACK PAIN 12/06/2008  . FIBROIDS, UTERUS 11/24/2008  . ESRD (end stage renal disease) on dialysis ([place]) 07/01/2006    Past Surgical History:  Procedure Laterality Date  . AORTIC VALVE REPLACEMENT N/A 02/25/2016   Procedure: AORTIC VALVE REPLACEMENT (AVR) implanted with Magna Ease Aortic valve size 62mm;  Surgeon: Melrose Nakayama, MD;  Location: [facility];  Service: Open Heart Surgery;  Laterality: N/A;  . [place] Right 02/17/2016   Procedure: REMOVAL OF TWO ARTERIOVENOUS GORETEX GRAFTS ([place]);  Surgeon: Angelia Mould, MD;  Location: [facility];  Service: Vascular;  Laterality: Right;  . BREAST BIOPSY     stereo left 2013  . BREAST BIOPSY     stereo right 2011  . DG AV DIALYSIS GRAFT DECLOT OR    . DILATATION & CURETTAGE/HYSTEROSCOPY WITH TRUECLEAR N/A 11/06/2012   Procedure: DILATATION & CURETTAGE/HYSTEROSCOPY WITH TRUECLEAR;  Surgeon: Terrance Mass, MD;  Location: [facility];  Service: Gynecology;  Laterality: N/A;  Truclear Resectoscopic Polypectomy   . INSERTION OF DIALYSIS CATHETER N/A 02/19/2016   Procedure: INSERTION OF Left Internal Jugular DIALYSIS CATHETER;  Surgeon: Angelia Mould, MD;  Location: [facility];  Service: Vascular;  Laterality: N/A;  . PATCH ANGIOPLASTY Right 02/17/2016   Procedure: PATCH ANGIOPLASTY;  Surgeon: Angelia Mould, MD;  Location: [facility];  Service: Vascular;  Laterality: Right;  . PERIPHERAL VASCULAR CATHETERIZATION N/A 09/14/2014   Procedure: A/V Shuntogram/Fistulagram;  Surgeon: Katha Cabal, MD;  Location: [facility];  Service: Cardiovascular;  Laterality: N/A;  . PERIPHERAL VASCULAR CATHETERIZATION N/A 09/14/2014   Procedure: A/V Shunt Intervention;  Surgeon: Katha Cabal, MD;  Location: [facility];  Service: Cardiovascular;  Laterality: N/A;  . PERIPHERAL VASCULAR CATHETERIZATION Right 12/09/2014    Procedure: A/V Shuntogram/Fistulagram;  Surgeon: Algernon Huxley, MD;  Location: [facility];  Service: Cardiovascular;  Laterality: Right;  . PERIPHERAL VASCULAR CATHETERIZATION N/A 12/09/2014   Procedure: A/V Shunt Intervention;  Surgeon: Algernon Huxley, MD;  Location: [facility];  Service: Cardiovascular;  Laterality: N/A;  . PERIPHERAL VASCULAR CATHETERIZATION Right 05/24/2015   Procedure: A/V Shuntogram;  Surgeon: Serafina Mitchell, MD;  Location: [facility];  Service: Cardiovascular;  Laterality: Right;  . PERIPHERAL VASCULAR CATHETERIZATION Right 05/24/2015   Procedure: Peripheral Vascular Balloon Angioplasty;  Surgeon: Serafina Mitchell, MD;  Location: [facility];  Service: Cardiovascular;  Laterality: Right;  right arm shunt  . PERIPHERAL VASCULAR CATHETERIZATION N/A 06/13/2015   Procedure: A/V Shuntogram/Fistulagram;  Surgeon: Algernon Huxley, MD;  Location: [facility];  Service: Cardiovascular;  Laterality: N/A;  . PERIPHERAL VASCULAR CATHETERIZATION N/A 06/13/2015   Procedure: A/V Shunt Intervention;  Surgeon: Algernon Huxley, MD;  Location: [facility];  Service: Cardiovascular;  Laterality: N/A;  . TEE WITHOUT CARDIOVERSION N/A 02/22/2016   Procedure: TRANSESOPHAGEAL ECHOCARDIOGRAM (TEE);  Surgeon: Pixie Casino, MD;  Location[facility];  Service: Cardiovascular;  Laterality: N/A;  . TEE WITHOUT CARDIOVERSION N/A 02/25/2016   Procedure: TRANSESOPHAGEAL ECHOCARDIOGRAM (TEE);  Surgeon: Melrose Nakayama, MD;  Location: [facility];  Service: Open Heart Surgery;  Laterality: N/A;  . TUBAL LIGATION  1983    OB History    Gravida Para Term Preterm AB Living   2 2       2    SAB TAB Ectopic Multiple Live Births                   Home Medications    Prior to Admission medications   Medication Sig Start Date End Date Taking? Authorizing Provider  aspirin EC 325 MG EC tablet Take 1 tablet (325 mg total) by mouth daily. 03/06/16  Yes Barrett, Lodema Hong,  PA-C  cinacalcet (SENSIPAR) 60 MG tablet Take 120 mg by mouth every Monday, Wednesday, and Friday with hemodialysis.    Yes [provider]  famotidine (PEPCID) 20 MG tablet Take 20 mg by mouth See admin instructions. Lunch and dinner   Yes [provider]  ibuprofen (ADVIL,MOTRIN) 600 MG tablet Take 600 mg by mouth every 6 (six) hours as needed for mild pain.   Yes [provider]  midodrine (PROAMATINE) 10 MG tablet Take 1 tablet (10 mg total) by mouth 2 (two) times daily as needed (30 min prior to HD). 08/27/16  Yes Mikell, Jeani Sow, MD  Multiple Vitamins-Minerals (PRORENAL QD PO) Take 1 tablet by mouth daily.    Yes [provider]  Nutritional Supplements (NEPRO) LIQD Take 237 mLs by mouth every dialysis (Mon, Wed, Fri).    Yes [provider]  sevelamer (RENVELA) 800 MG tablet Take 800 mg by mouth 3 (three) times daily with meals.    Yes [provider]  megestrol (MEGACE) 40 MG tablet Take 1 tablet (40 mg total) by mouth daily. Patient  not taking: Reported on 09/28/2016 05/17/16   Sela Hilding, MD    Family History Family History  Problem Relation Age of Onset  . Diabetes Mother   . Hypertension Mother   . Heart disease Mother   . Alcohol abuse Mother   . Kidney disease Mother   . Cancer Father        COLON  . Alcohol abuse Father   . Breast cancer Sister 65  . Hypertension Sister   . Kidney disease Sister   . Hypertension Son   . Kidney disease Son     Social History Social History  Substance Use Topics  . Smoking status: Never Smoker  . Smokeless tobacco: Never Used  . Alcohol use No     Allergies   Contrast media [iodinated diagnostic agents] and Naproxen sodium   Review of Systems Review of Systems  Ten systems reviewed and are negative for acute change, except as noted in the HPI.   Physical Exam Updated Vital Signs BP (!) 113/53 (BP Location: Right Arm)   Pulse 70   Temp 97.8 F (36.6 C)  (Oral)   Resp 18   Ht 5\' 6"  (1.676 m)   Wt 77.1 kg (169 lb 14.4 oz)   SpO2 100%   BMI 27.42 kg/m   Physical Exam  Constitutional: She is oriented to person, place, and time. She appears well-developed and well-nourished. No distress.  HENT:  Head: Normocephalic and atraumatic.  Eyes: Conjunctivae are normal. No scleral icterus.  Neck: Normal range of motion.  Cardiovascular: Normal rate, regular rhythm and normal heart sounds.  Exam reveals no gallop and no friction rub.   No murmur heard. Pulmonary/Chest: Effort normal and breath sounds normal. No respiratory distress.  Abdominal: Soft. Bowel sounds are normal. She exhibits no distension and no mass. There is no tenderness. There is no guarding.  Neurological: She is alert and oriented to person, place, and time.  Alert and oriented tx4  Expressive aphasia (verbal and written) No facial droop. No pronator drift.  Normal strength and DTRs Sensation intact.  Skin: Skin is warm and dry. She is not diaphoretic.  Psychiatric: Her behavior is normal.  Nursing note and vitals reviewed.    ED Treatments / Results  Labs (all labs ordered are listed, but only abnormal results are displayed) Labs Reviewed  CBC - Abnormal; Notable for the following:       Result Value   Hemoglobin 11.0 (*)    HCT 33.7 (*)    RDW 16.6 (*)    All other components within normal limits  COMPREHENSIVE METABOLIC PANEL - Abnormal; Notable for the following:    BUN 24 (*)    Creatinine, Ser 6.25 (*)    Albumin 3.3 (*)    Alkaline Phosphatase 239 (*)    GFR calc non Af Amer 6 (*)    GFR calc Af Amer 7 (*)    All other components within normal limits  RENAL FUNCTION PANEL - Abnormal; Notable for the following:    Chloride 99 (*)    Creatinine, Ser 4.10 (*)    Albumin 3.1 (*)    GFR calc non Af Amer 11 (*)    GFR calc Af Amer 12 (*)    All other components within normal limits  I-STAT CHEM 8, ED - Abnormal; Notable for the following:    BUN 25 (*)     Creatinine, Ser 6.20 (*)    Calcium, Ion 1.12 (*)    Hemoglobin 11.6 (*)  HCT 34.0 (*)    All other components within normal limits  MRSA PCR SCREENING  ETHANOL  PROTIME-INR  APTT  DIFFERENTIAL  LIPID PANEL  RAPID URINE DRUG SCREEN, HOSP PERFORMED  URINALYSIS, ROUTINE W REFLEX MICROSCOPIC  HEMOGLOBIN A1C  I-STAT TROPONIN, ED  CBG MONITORING, ED    EKG  EKG Interpretation  Date/Time:  Friday September 28 2016 07:54:54 EDT Ventricular Rate:  73 PR Interval:    QRS Duration: 97 QT Interval:  454 QTC Calculation: 501 R Axis:   54 Text Interpretation:  Sinus rhythm Borderline T abnormalities, inferior leads Prolonged QT interval Baseline wander in lead(s) V6 No STEMI.  Confirmed by Nanda Quinton [phone]) on 09/28/2016 8:24:34 AM       Radiology No results found.  Procedures Procedures (including critical care time)  Medications Ordered in ED Medications  senna-docusate (Senokot-S) tablet 1 tablet (not administered)  heparin injection 5,000 Units (5,000 Units Subcutaneous Given 09/30/16 1342)  midodrine (PROAMATINE) tablet 10 mg (not administered)  cinacalcet (SENSIPAR) tablet 60 mg (60 mg Oral Given 09/30/16 0920)  famotidine (PEPCID) tablet 20 mg (20 mg Oral Given 09/30/16 0920)  feeding supplement (NEPRO CARB STEADY) liquid 237 mL (237 mLs Oral New Bag/Given 09/30/16 1000)  witch hazel-glycerin (TUCKS) pad (not administered)  clopidogrel (PLAVIX) tablet 75 mg (75 mg Oral Given 09/30/16 0920)  aspirin tablet 325 mg (325 mg Oral Given 09/28/16 1006)   stroke: mapping our early stages of recovery book ( Does not apply Given by Other 09/30/16 0923)     Initial Impression / Assessment and Plan / ED Course  I have reviewed the triage vital signs and the nursing notes.  Pertinent labs & imaging results that were available during my care of the patient were reviewed by me and considered in my medical decision making (see chart for details).    4:04 PM Patient sxs resolved.  No  evidence of acute stroke MRI negative, but will need admission for TIA    Final Clinical Impressions(s) / ED Diagnoses   Final diagnoses:  Aphasia  Transient cerebral ischemia, unspecified type    New Prescriptions Current Discharge Medication List       Margarita Mail, PA-C 09/30/16 1606

## 2016-09-28 NOTE — Procedures (Signed)
Patient seen on Hemodialysis. QB 350, UF goal 3L Treatment adjusted as needed.  Elmarie Shiley MD [facility]. Office # [phone] Pager # [phone] 1:41 PM

## 2016-09-28 NOTE — Progress Notes (Signed)
[facility] Renal Consultation Note    Indication for Consultation:  Management of ESRD/hemodialysis; anemia, hypertension/volume and secondary hyperparathyroidism PCP: Dr. Kerrin Mo  HPI: [name] Marks is a 61 y.o. 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   Patient states she awoke this AM to get ready for HD and noticed the L side of her face "felt heavy" and she was having difficulty speaking, felt dizzy. Says she called son and asked him to call EMS. She presented to ED for evaluation. MRI showed no sign of acute or subacute infarction. Admission labs basically unremarkable. She has been evaluated by neurology. As time has passed, sx have resolved and currently she is back to baseline. She has no complaints at present except for that she can't find her glasses (possibly left at home). She denies fever, chills, N,V,D, chest pain, SOB, DOE, abdominal or flank pain, no prior HAs or vision changes, no precipitating events prior to this AM out of the ordinary. Says she feels much better now, "like myself again". Neurology suggests sx more consistent with TIA. She has been admitted as observation patient for TIA.. We have been asked to provide hemodialysis which she will have on schedule later this afternoon.    Ms. Marks is an excellent hemodialysis patient, does not usually miss or shorten treatments. She gets within acceptable range of EDW.  BMD labs at goal except for elevated PTH 732 (09/19/16). 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 Last HGB 10.4 (09/26/16) Fe 36  Tsat 20 Ferritin 992 (09/19/16).   Past Medical History:  Diagnosis Date  . Complication of anesthesia    due to kidney disease  . End stage renal disease on dialysis ([place])   . GERD (gastroesophageal reflux disease)   . Gout     Past Surgical History:  Procedure Laterality Date  . AORTIC VALVE REPLACEMENT N/A 02/25/2016   Procedure: AORTIC VALVE REPLACEMENT (AVR) implanted with Magna Ease Aortic valve size 52mm;  Surgeon: Melrose Nakayama, MD;  Location: [place];  Service: Open Heart Surgery;  Laterality: N/A;  . [place] Right 02/17/2016   Procedure: REMOVAL OF TWO ARTERIOVENOUS GORETEX GRAFTS ([place]);  Surgeon: Angelia Mould, MD;  Location: [place];  Service: Vascular;  Laterality: Right;  . BREAST BIOPSY     stereo left 2013  . BREAST BIOPSY     stereo right 2011  . DG AV DIALYSIS GRAFT DECLOT OR    . DILATATION & CURETTAGE/HYSTEROSCOPY WITH TRUECLEAR N/A 11/06/2012   Procedure: DILATATION & CURETTAGE/HYSTEROSCOPY WITH TRUECLEAR;  Surgeon: Terrance Mass, MD;  Location: [facility];  Service: Gynecology;  Laterality: N/A;  Truclear Resectoscopic Polypectomy   . INSERTION OF DIALYSIS CATHETER N/A 02/19/2016   Procedure: INSERTION OF Left Internal Jugular DIALYSIS CATHETER;  Surgeon: Angelia Mould, MD;  Location: [place];  Service: Vascular;  Laterality: N/A;  . PATCH ANGIOPLASTY Right 02/17/2016   Procedure: PATCH ANGIOPLASTY;  Surgeon: Angelia Mould, MD;  Location: [place];  Service: Vascular;  Laterality: Right;  . PERIPHERAL VASCULAR CATHETERIZATION N/A 09/14/2014   Procedure: A/V Shuntogram/Fistulagram;  Surgeon: Katha Cabal, MD;  Location: [place] [facility];  Service: Cardiovascular;  Laterality: N/A;  . PERIPHERAL VASCULAR CATHETERIZATION N/A 09/14/2014   Procedure: A/V Shunt Intervention;  Surgeon: Katha Cabal, MD;  Location: [place] [facility];  Service: Cardiovascular;  Laterality: N/A;  . PERIPHERAL VASCULAR CATHETERIZATION Right 12/09/2014   Procedure: A/V Shuntogram/Fistulagram;  Surgeon: Algernon Huxley, MD;  Location: [place] [facility];  Service: Cardiovascular;  Laterality: Right;  . PERIPHERAL VASCULAR CATHETERIZATION N/A 12/09/2014   Procedure: A/V Shunt  Intervention;  Surgeon: Algernon Huxley, MD;  Location: [place] [facility];  Service: Cardiovascular;  Laterality: N/A;  . PERIPHERAL VASCULAR CATHETERIZATION Right 05/24/2015   Procedure: A/V Shuntogram;  Surgeon: Serafina Mitchell, MD;  Location: [place] [facility];  Service: Cardiovascular;  Laterality: Right;  . PERIPHERAL VASCULAR CATHETERIZATION Right 05/24/2015   Procedure: Peripheral Vascular Balloon Angioplasty;  Surgeon: Serafina Mitchell, MD;  Location: [place] [facility];  Service: Cardiovascular;  Laterality: Right;  right arm shunt  . PERIPHERAL VASCULAR CATHETERIZATION N/A 06/13/2015   Procedure: A/V Shuntogram/Fistulagram;  Surgeon: Algernon Huxley, MD;  Location: [place] [facility];  Service: Cardiovascular;  Laterality: N/A;  . PERIPHERAL VASCULAR CATHETERIZATION N/A 06/13/2015   Procedure: A/V Shunt Intervention;  Surgeon: Algernon Huxley, MD;  Location: [place] [facility];  Service: Cardiovascular;  Laterality: N/A;  . TEE WITHOUT CARDIOVERSION N/A 02/22/2016   Procedure: TRANSESOPHAGEAL ECHOCARDIOGRAM (TEE);  Surgeon: Pixie Casino, MD;  Location: [facility];  Service: Cardiovascular;  Laterality: N/A;  . TEE WITHOUT CARDIOVERSION N/A 02/25/2016   Procedure: TRANSESOPHAGEAL ECHOCARDIOGRAM (TEE);  Surgeon: Melrose Nakayama, MD;  Location: [place];  Service: Open Heart Surgery;  Laterality: N/A;  . TUBAL LIGATION  1983   Family History  Problem Relation Age of Onset  . Diabetes Mother   . Hypertension Mother   . Heart disease Mother   . Alcohol abuse Mother   . Kidney disease Mother   . Cancer Father        COLON  . Alcohol abuse Father   . Breast cancer Sister 72  . Hypertension Sister   . Kidney disease Sister   . Hypertension Son   . Kidney disease Son    Social History:  reports that she has never smoked. She has never used smokeless tobacco. She reports that she does not drink alcohol or use drugs. Allergies  Allergen Reactions  . Contrast Media [Iodinated  Diagnostic Agents] Anaphylaxis  . Naproxen Sodium Itching   Prior to Admission medications   Medication Sig Start Date End Date Taking? Authorizing Provider  aspirin EC 325 MG EC tablet Take 1 tablet (325 mg total) by mouth daily. 03/06/16  Yes Barrett, Lodema Hong, PA-C  cinacalcet (SENSIPAR) 60 MG tablet Take 120 mg by mouth every Monday, Wednesday, and Friday with hemodialysis.    Yes [provider]  famotidine (PEPCID) 20 MG tablet Take 20 mg by mouth See admin instructions. Lunch and dinner   Yes [provider]  ibuprofen (ADVIL,MOTRIN) 600 MG tablet Take 600 mg by mouth every 6 (six) hours as needed for mild pain.   Yes [provider]  midodrine (PROAMATINE) 10 MG tablet Take 1 tablet (10 mg total) by mouth 2 (two) times daily as needed (30 min prior to HD). 08/27/16  Yes Mikell, Jeani Sow, MD  Multiple Vitamins-Minerals (PRORENAL QD PO) Take 1 tablet by mouth daily.    Yes [provider]  Nutritional Supplements (NEPRO) LIQD Take 237 mLs by mouth every dialysis (Mon, Wed, Fri).    Yes [provider]  sevelamer (RENVELA) 800 MG tablet Take 800 mg by mouth 3 (three) times daily with meals.    Yes [provider]  megestrol (  MEGACE) 40 MG tablet Take 1 tablet (40 mg total) by mouth daily. Patient not taking: Reported on 09/28/2016 05/17/16   Sela Hilding, MD   Current Facility-Administered Medications  Medication Dose Route Frequency Provider Last Rate Last Dose  .  stroke: mapping our early stages of recovery book   Does not apply Once Smiley Houseman, MD      . aspirin EC tablet 325 mg  325 mg Oral Daily Smiley Houseman, MD      . cinacalcet (SENSIPAR) tablet 60 mg  60 mg Oral Q breakfast Smiley Houseman, MD      . famotidine (PEPCID) tablet 20 mg  20 mg Oral Daily Smiley Houseman, MD      . feeding supplement (NEPRO CARB STEADY) liquid 237 mL  237 mL Oral Daily McDiarmid, Blane Ohara, MD      . heparin injection  5,000 Units  5,000 Units Subcutaneous Q8H Smiley Houseman, MD      . midodrine (PROAMATINE) tablet 10 mg  10 mg Oral BID PRN Smiley Houseman, MD      . senna-docusate (Senokot-S) tablet 1 tablet  1 tablet Oral QHS PRN Smiley Houseman, MD      . sevelamer carbonate (RENVELA) tablet 800 mg  800 mg Oral TID WC Smiley Houseman, MD      . witch hazel-glycerin (TUCKS) pad   Topical PRN McDiarmid, Blane Ohara, MD       Labs: Basic Metabolic Panel:  Recent Labs Lab 09/28/16 [phone] 09/28/16 0637  NA 141 142  K 3.8 3.7  CL 105 104  CO2 26  --   GLUCOSE 88 86  BUN 24* 25*  CREATININE 6.25* 6.20*  CALCIUM 9.4  --    Liver Function Tests:  Recent Labs Lab 09/28/16 0632  AST 32  ALT 23  ALKPHOS 239*  BILITOT 0.6  PROT 6.7  ALBUMIN 3.3*   No results for input(s): LIPASE, AMYLASE in the last 168 hours. No results for input(s): AMMONIA in the last 168 hours. CBC:  Recent Labs Lab 09/28/16 0632 09/28/16 0637  WBC 4.8  --   NEUTROABS 2.3  --   HGB 11.0* 11.6*  HCT 33.7* 34.0*  MCV 83.8  --   PLT 190  --    Cardiac Enzymes: No results for input(s): CKTOTAL, CKMB, CKMBINDEX, TROPONINI in the last 168 hours. CBG:  Recent Labs Lab 09/28/16 0630  GLUCAP 81   Iron Studies: No results for input(s): IRON, TIBC, TRANSFERRIN, FERRITIN in the last 72 hours. Studies/Results: Mr Virgel Paling FU Contrast  Result Date: 09/28/2016 CLINICAL DATA:  Acute presentation or speech disturbance and right-sided weakness. EXAM: MRI HEAD WITHOUT CONTRAST MRA HEAD WITHOUT CONTRAST TECHNIQUE: Multiplanar, multiecho pulse sequences of the brain and surrounding structures were obtained without intravenous contrast. Angiographic images of the head were obtained using MRA technique without contrast. COMPARISON:  CT same day.  MRI 05/17/2016. FINDINGS: MRI HEAD FINDINGS Brain: Diffusion imaging does not show any acute or subacute infarction. The brainstem is normal. There is cerebellar atrophy.  Cerebral hemispheres show atrophy with moderate chronic small-vessel ischemic changes affecting the deep and subcortical white matter. No visible change since the study of March. No mass lesion, hemorrhage, hydrocephalus or extra-axial collection. Vascular: Major vessels at the base of the brain show flow. Skull and upper cervical spine: Negative Sinuses/Orbits: Clear/normal Other: None significant MRA HEAD FINDINGS Motion degraded study compared to the previous exam. Both internal carotid arteries are widely  patent through the skullbase and siphon regions. The anterior and middle cerebral vessels are patent without proximal stenosis, aneurysm or vascular malformation. More distal branch vessels show atherosclerotic irregularity, more notable in the left anterior cerebral and middle cerebral branches. Fetal origin posterior cerebral arteries bilaterally. Posterior circulation is very diminutive. Small vertebral arteries. Left vertebral artery terminates in PICA. Right vertebral artery supplies a small basilar. Limited detail because of the motion degradation. IMPRESSION: No sign of acute or subacute infarction. Atrophy and moderate chronic small-vessel ischemic changes, without visible change since 05/17/2016. MR angiography does not show any anterior circulation large or medium vessel lesion. More distal branch vessels show atherosclerotic irregularity and narrowing, more notable in the left MCA and ACA regions. The study suffers from motion degradation. The posterior circulation is quite diminutive secondary to fetal origin of the posterior cerebral arteries. Electronically Signed   By: Nelson Chimes M.D.   On: 09/28/2016 08:10   Mr Brain Wo Contrast  Result Date: 09/28/2016 CLINICAL DATA:  Acute presentation or speech disturbance and right-sided weakness. EXAM: MRI HEAD WITHOUT CONTRAST MRA HEAD WITHOUT CONTRAST TECHNIQUE: Multiplanar, multiecho pulse sequences of the brain and surrounding structures were  obtained without intravenous contrast. Angiographic images of the head were obtained using MRA technique without contrast. COMPARISON:  CT same day.  MRI 05/17/2016. FINDINGS: MRI HEAD FINDINGS Brain: Diffusion imaging does not show any acute or subacute infarction. The brainstem is normal. There is cerebellar atrophy. Cerebral hemispheres show atrophy with moderate chronic small-vessel ischemic changes affecting the deep and subcortical white matter. No visible change since the study of March. No mass lesion, hemorrhage, hydrocephalus or extra-axial collection. Vascular: Major vessels at the base of the brain show flow. Skull and upper cervical spine: Negative Sinuses/Orbits: Clear/normal Other: None significant MRA HEAD FINDINGS Motion degraded study compared to the previous exam. Both internal carotid arteries are widely patent through the skullbase and siphon regions. The anterior and middle cerebral vessels are patent without proximal stenosis, aneurysm or vascular malformation. More distal branch vessels show atherosclerotic irregularity, more notable in the left anterior cerebral and middle cerebral branches. Fetal origin posterior cerebral arteries bilaterally. Posterior circulation is very diminutive. Small vertebral arteries. Left vertebral artery terminates in PICA. Right vertebral artery supplies a small basilar. Limited detail because of the motion degradation. IMPRESSION: No sign of acute or subacute infarction. Atrophy and moderate chronic small-vessel ischemic changes, without visible change since 05/17/2016. MR angiography does not show any anterior circulation large or medium vessel lesion. More distal branch vessels show atherosclerotic irregularity and narrowing, more notable in the left MCA and ACA regions. The study suffers from motion degradation. The posterior circulation is quite diminutive secondary to fetal origin of the posterior cerebral arteries. Electronically Signed   By: Nelson Chimes  M.D.   On: 09/28/2016 08:10   Ct Head Code Stroke Wo Contrast  Result Date: 09/28/2016 CLINICAL DATA:  Code stroke. Initial evaluation for acute speech difficulty, right-sided weakness. EXAM: CT HEAD WITHOUT CONTRAST TECHNIQUE: Contiguous axial images were obtained from the base of the skull through the vertex without intravenous contrast. COMPARISON:  Prior CT from 05/16/2016 as well as previous MRI from 05/17/2016. FINDINGS: Brain: Stable atrophy with moderate chronic small vessel ischemic disease. No acute intracranial hemorrhage. No evidence for acute large vessel territory infarct. No mass lesion, midline shift or mass effect. Ventricular prominence related to global parenchymal volume loss of hydrocephalus. No extra-axial fluid collection. Vascular: No hyperdense vessel. Scattered vascular calcifications noted within the carotid  siphons. Skull: Scalp soft tissues and calvarium within normal limits. Sinuses/Orbits: Globes and orbital soft tissues within normal limits. Paranasal sinuses are clear. No mastoid effusion. ASPECTS [facility] Stroke Program Early CT Score) - Ganglionic level infarction (caudate, lentiform nuclei, internal capsule, insula, M1-M3 cortex): 7 - Supraganglionic infarction (M4-M6 cortex): 3 Total score (0-10 with 10 being normal): 10 IMPRESSION: 1. No acute intracranial infarct or other process identified 2. ASPECTS is 10. 3. Stable atrophy with chronic small vessel ischemic disease. Critical Value/emergent results were called by telephone at the time of interpretation on 09/28/2016 at 6:52 am to Dr. Leonel Ramsay, who verbally acknowledged these results. Electronically Signed   By: Jeannine Boga M.D.   On: 09/28/2016 06:53    ROS: As per HPI otherwise negative.   Physical Exam: Vitals:   09/28/16 0915 09/28/16 0945 09/28/16 1000 09/28/16 1030  BP: (!) 134/95 (!) 147/80  (!) 152/67  Pulse:    61  Resp: 14  20 18   Temp:    (!) 97.5 F (36.4 C)  TempSrc:    Oral  SpO2:   100%  100%  Weight:      Height:         General: Well developed, well nourished, in no acute distress. Head: Normocephalic, atraumatic, sclera non-icteric, mucus membranes are moist Neck: Supple. JVD not elevated. Lungs: Clear bilaterally to auscultation without wheezes, rales, or rhonchi. Breathing is unlabored. Heart: RRR with S1 S2 2/6 systolic M no R/G. No JVD.  Abdomen: Soft, non-tender, non-distended with normoactive bowel sounds. No rebound/guarding. No obvious abdominal masses. M-S:  Strength and tone appear normal for age. Lower extremities:without edema or ischemic changes, no open wounds  Neuro: Alert and oriented X 3. Moves all extremities spontaneously. Psych:  Responds to questions appropriately with a normal affect. Dialysis Access: [facility].  Dialysis Orders: [facility] MWF 4 hrs 180 NRe 400/Auto 1.5 80 kg 2.0 K/ 2.25 Ca UF Profile 2 -Heparin 6000 units IV initial bolus, 2500 units IV mid run TIW -Mircera 100 mcg IV Q 2 weeks (last dose 09/26/16) -Venofer 100 mg IV X 5 doses (1/5 given last dose 09/26/16) -Calcitriol 0.25 mcg PO TIW Renvela 800 mg 2 tabs PO TIW AC Senispar 60mg  po daily  Assessment/Plan: 1.  TIA: MRI negative for acute infarct. Neurology following. Per primary 2.  ESRD -  T,Th,S HD today on schedule. K+ 3.7. Use 3.0 K bath.  3.  Hypertension/volume  -BP controlled. Midodrine 10 mg PO BID PRN on OP med list.  4.  Anemia  - HGB 11.0. Continue Fe load.  5.  Metabolic bone disease -  Cont binders, VDRA, Sensipar. 6.  Nutrition - Albumin 3.3 Renal diet/Fld restrictions, add renal vit/nepro.   Masey Scheiber H. Owens Shark, NP-C 09/28/2016, 11:57 AM  [employer] [phone]

## 2016-09-28 NOTE — ED Provider Notes (Addendum)
Medical screening examination/treatment/procedure(s) were conducted as a shared visit with non-physician practitioner(s) and myself.  I personally evaluated the patient during the encounter.   EKG Interpretation None      Patient is a 61 year old female with history of end-stage renal disease on hemodialysis Monday, Wednesday and Friday who presents emergency department with aphasia that started at 5 AM. States she woke up and was feeling well and then noticed shortly after 5 AM she started having difficulty finding her words. No slurred speech. No numbness, tingling or focal weakness. No head injury. Patient is on full dose aspirin but no anticoagulation. On exam, patient has expressive aphasia. No dysarthria. No facial asymmetry noted initially. No pronator drift. Normal sensation diffusely. Head CT shows no acute abnormality. Labs unremarkable.  Dr. Leonel Ramsay With neurology has seen patient. Appreciate his help. Patient's symptoms resolved while an MRI. MRI negative. Neurology recommends admission to medicine for TIA workup.    Angiocath insertion Performed by: Nyra Jabs  Consent: Verbal consent obtained. Risks and benefits: risks, benefits and alternatives were discussed Time out: Immediately prior to procedure a "time out" was called to verify the correct patient, procedure, equipment, support staff and site/side marked as required.  Preparation: Patient was prepped and draped in the usual sterile fashion.  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      Kennah Hehr, Delice Bison, DO 09/28/16 [place], Delice Bison, DO 09/28/16 [phone]

## 2016-09-28 NOTE — H&P (Signed)
[facility] Admission History and Physical Service Pager: [phone]  Patient name: [name] Marks Medical record number: [id number] Date of birth: 1955-06-27 Age: 61 y.o. Gender: female  Primary Care Provider: Tonette Bihari, MD Consultants: Neurology  Code Status: FULL  Chief Complaint: expressive aphasia and facial droop  Assessment and Plan: [name] Marks is a 61 y.o. female presenting with expressive aphasia and facial droop. PMH is significant for hx TIA, hx of aortic valve replacement with bioprosthetic valve, ESRD on HD M,W,F; Obesity.   TIA: Expressive aphasia has improved but still has some issues finding words. Otherwise neuro exam is unremarkable. CT head negative for acute infarct. MRI brain without acute infarct. MRA head.  ECHO completed in 04/2016 with normal EF and dilated right ventricle.  - admit to telemetry, attending Dr. McDiarmid - neurology following, appreciate recommendations - neuro checks per protocol  - Carotid Dopplers - ASA 325mg  daily (home med; consider adding Plavix) - NPO until swallow evaluation  - OT/SLP - hemoglobin A1c - lipid panel   ESRD: HD M,W,F. Electrolytes stable on admit blood work.  - nephrology consulted - continue home Renvela and Sensipar    FEN/GI: NPO until bedside swallow eval  Prophylaxis: heparin SQ  Disposition: admit for TIA workup   History of Present Illness:  [name] Marks is a 61 y.o. female presenting with expressive aphasia and facial droop.   Patient reports she was in her normal state of health until 5:50 AM when she noticed that she could not get her words out while she was getting ready to go to dialysis. She also noticed some left facial droop. She denied any difficulty ambulating, focal weakness, or numbness. She denied any chest pain, shortness of breath, HA, blurred vision, or palpitations. She reports that she had similar symptoms once before after dialysis. Denies  tobacco use or alcohol use.  Patient arrived via EMS and code stroke was called. CT head and MRI brain were negative for acute infarct.    Review Of Systems:   Review of Systems  Constitutional: Negative for chills and fever.  HENT: Negative for congestion and sore throat.   Eyes: Negative for blurred vision and double vision.  Respiratory: Negative for cough and shortness of breath.   Cardiovascular: Negative for chest pain, palpitations, orthopnea and leg swelling.  Gastrointestinal: Positive for constipation. Negative for nausea.  Genitourinary: Negative for dysuria and frequency.  Musculoskeletal: Negative for falls and joint pain.  Neurological: Negative for dizziness and headaches.  Psychiatric/Behavioral: Negative for depression. The patient is not nervous/anxious.     Patient Active Problem List   Diagnosis Date Noted  . AVD (aortic valve disease) 07/12/2016  . Pulmonary HTN ([place]) 07/12/2016  . Carpal tunnel syndrome 07/05/2016  . Swallowing difficulty 06/15/2016  . 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 Right leg pain 05/29/2016  . CVA (cerebral vascular accident) ([place]) 05/16/2016  . Aphasia   . History of aortic valve replacement with bioprosthetic valve 05/08/2016  . S/P AVR 02/25/2016  . Bacteremia due to coagulase-negative Staphylococcus   . S/P dialysis catheter insertion ([place])   . Bilateral low back pain with sciatica   . Neck pain   . Endocarditis   . Staphylococcus aureus bacteremia   . Anemia   . Septic shock ([place])   . Renal dialysis device, implant, or graft complication 26/33/3545  . Pain and swelling of right upper extremity 12/20/2015  . Complex endometrial hyperplasia with atypia 11/08/2015  . Increased  endometrial stripe thickness 11/03/2015  . Obesity 10/28/2014  . Shoulder pain, bilateral 10/28/2014  . Health care maintenance 10/28/2014  . Right carotid bruit 01/08/2013  . Generalized headaches 01/08/2013  . Chronic female pelvic pain  08/08/2012  . Gout, unspecified 12/06/2008  . Esophageal reflux 12/06/2008  . BACK PAIN 12/06/2008  . FIBROIDS, UTERUS 11/24/2008  . ESRD (end stage renal disease) on dialysis ([place]) 07/01/2006    Past Medical History: Past Medical History:  Diagnosis Date  . Complication of anesthesia    due to kidney disease  . End stage renal disease on dialysis ([place])   . GERD (gastroesophageal reflux disease)   . Gout     Past Surgical History: Past Surgical History:  Procedure Laterality Date  . AORTIC VALVE REPLACEMENT N/A 02/25/2016   Procedure: AORTIC VALVE REPLACEMENT (AVR) implanted with Magna Ease Aortic valve size 99mm;  Surgeon: Melrose Nakayama, MD;  Location: [facility];  Service: Open Heart Surgery;  Laterality: N/A;  . [place] Right 02/17/2016   Procedure: REMOVAL OF TWO ARTERIOVENOUS GORETEX GRAFTS ([place]);  Surgeon: Angelia Mould, MD;  Location: [facility];  Service: Vascular;  Laterality: Right;  . BREAST BIOPSY     stereo left 2013  . BREAST BIOPSY     stereo right 2011  . DG AV DIALYSIS GRAFT DECLOT OR    . DILATATION & CURETTAGE/HYSTEROSCOPY WITH TRUECLEAR N/A 11/06/2012   Procedure: DILATATION & CURETTAGE/HYSTEROSCOPY WITH TRUECLEAR;  Surgeon: Terrance Mass, MD;  Location: [facility];  Service: Gynecology;  Laterality: N/A;  Truclear Resectoscopic Polypectomy   . INSERTION OF DIALYSIS CATHETER N/A 02/19/2016   Procedure: INSERTION OF Left Internal Jugular DIALYSIS CATHETER;  Surgeon: Angelia Mould, MD;  Location: [facility];  Service: Vascular;  Laterality: N/A;  . PATCH ANGIOPLASTY Right 02/17/2016   Procedure: PATCH ANGIOPLASTY;  Surgeon: Angelia Mould, MD;  Location: [facility];  Service: Vascular;  Laterality: Right;  . PERIPHERAL VASCULAR CATHETERIZATION N/A 09/14/2014   Procedure: A/V Shuntogram/Fistulagram;  Surgeon: Katha Cabal, MD;  Location: [facility];  Service: Cardiovascular;  Laterality: N/A;  . PERIPHERAL VASCULAR CATHETERIZATION  N/A 09/14/2014   Procedure: A/V Shunt Intervention;  Surgeon: Katha Cabal, MD;  Location: [facility];  Service: Cardiovascular;  Laterality: N/A;  . PERIPHERAL VASCULAR CATHETERIZATION Right 12/09/2014   Procedure: A/V Shuntogram/Fistulagram;  Surgeon: Algernon Huxley, MD;  Location: [facility];  Service: Cardiovascular;  Laterality: Right;  . PERIPHERAL VASCULAR CATHETERIZATION N/A 12/09/2014   Procedure: A/V Shunt Intervention;  Surgeon: Algernon Huxley, MD;  Location: [facility];  Service: Cardiovascular;  Laterality: N/A;  . PERIPHERAL VASCULAR CATHETERIZATION Right 05/24/2015   Procedure: A/V Shuntogram;  Surgeon: Serafina Mitchell, MD;  Location: [facility];  Service: Cardiovascular;  Laterality: Right;  . PERIPHERAL VASCULAR CATHETERIZATION Right 05/24/2015   Procedure: Peripheral Vascular Balloon Angioplasty;  Surgeon: Serafina Mitchell, MD;  Location: [facility];  Service: Cardiovascular;  Laterality: Right;  right arm shunt  . PERIPHERAL VASCULAR CATHETERIZATION N/A 06/13/2015   Procedure: A/V Shuntogram/Fistulagram;  Surgeon: Algernon Huxley, MD;  Location: [facility];  Service: Cardiovascular;  Laterality: N/A;  . PERIPHERAL VASCULAR CATHETERIZATION N/A 06/13/2015   Procedure: A/V Shunt Intervention;  Surgeon: Algernon Huxley, MD;  Location: [facility];  Service: Cardiovascular;  Laterality: N/A;  . TEE WITHOUT CARDIOVERSION N/A 02/22/2016   Procedure: TRANSESOPHAGEAL ECHOCARDIOGRAM (TEE);  Surgeon: Pixie Casino, MD;  Location: [facility];  Service: Cardiovascular;  Laterality: N/A;  . TEE WITHOUT CARDIOVERSION N/A 02/25/2016   Procedure: TRANSESOPHAGEAL ECHOCARDIOGRAM (TEE);  Surgeon: Melrose Nakayama, MD;  Location: [facility];  Service: Open Heart Surgery;  Laterality: N/A;  . TUBAL LIGATION  1983    Social History: Social History  Substance Use Topics  . Smoking status: Never Smoker  . Smokeless tobacco: Never Used  .  Alcohol use No   Additional social history:  Please also refer to relevant sections of EMR.  Family History: Family History  Problem Relation Age of Onset  . Diabetes Mother   . Hypertension Mother   . Heart disease Mother   . Alcohol abuse Mother   . Kidney disease Mother   . Cancer Father        COLON  . Alcohol abuse Father   . Breast cancer Sister 33  . Hypertension Sister   . Kidney disease Sister   . Hypertension Son   . Kidney disease Son      Allergies and Medications: Allergies  Allergen Reactions  . Contrast Media [Iodinated Diagnostic Agents] Anaphylaxis  . Naproxen Sodium Itching   No current facility-administered medications on file prior to encounter.    Current Outpatient Prescriptions on File Prior to Encounter  Medication Sig Dispense Refill  . aspirin EC 325 MG EC tablet Take 1 tablet (325 mg total) by mouth daily. 30 tablet 0  . cinacalcet (SENSIPAR) 60 MG tablet Take 120 mg by mouth every Monday, Wednesday, and Friday with hemodialysis.     . famotidine (PEPCID) 20 MG tablet Take 20 mg by mouth See admin instructions. Lunch and dinner    . midodrine (PROAMATINE) 10 MG tablet Take 1 tablet (10 mg total) by mouth 2 (two) times daily as needed (30 min prior to HD). 60 tablet 0  . Multiple Vitamins-Minerals (PRORENAL QD PO) Take 1 tablet by mouth daily.     . Nutritional Supplements (NEPRO) LIQD Take 237 mLs by mouth every dialysis (Mon, Wed, Fri).     . sevelamer (RENVELA) 800 MG tablet Take 800 mg by mouth 3 (three) times daily with meals.     . megestrol (MEGACE) 40 MG tablet Take 1 tablet (40 mg total) by mouth daily. (Patient not taking: Reported on 09/28/2016) 30 tablet 0    Objective: BP (!) 152/67 (BP Location: Right Arm)   Pulse 61   Temp (!) 97.5 F (36.4 C) (Oral)   Resp 18   Ht 5\' 6"  (1.676 m)   Wt 181 lb 9 oz (82.4 kg)   SpO2 100%   BMI 29.30 kg/m  GEN: NAD HEENT: Atraumatic, normocephalic, neck supple, EOMI, sclera clear  CV: RRR,  no murmurs, rubs, or gallops PULM: CTAB, normal effort ABD: Soft, nontender, nondistended, NABS, no organomegaly SKIN: No rash or cyanosis; warm and well-perfused EXTR: No lower extremity edema or calf tenderness PSYCH: Mood and affect euthymic, normal rate and volume of speech NEURO: Awake, alert, no facial droop noted now, cranial nerves intact except patient still has some difficulty with expressive aphasia. Strength 5/5 in upper and lower extremities. Normal sensation to light touch in upper and lower extremities. Normal finger to nose.    Labs and Imaging: CBC BMET   Recent Labs Lab 09/28/16 0632 09/28/16 0637  WBC 4.8  --   HGB 11.0* 11.6*  HCT 33.7* 34.0*  PLT 190  --     Recent Labs Lab 09/28/16 0632 09/28/16 0637  NA 141 142  K 3.8 3.7  CL 105 104  CO2 26  --   BUN 24* 25*  CREATININE 6.25* 6.20*  GLUCOSE 88 86  CALCIUM 9.4  --     EtOH: neg istat trop 0.00 EKG: NSR, unchanged from prior   MRI/MRA Brain: IMPRESSION: No sign of acute or subacute infarction. Atrophy and moderate chronic small-vessel ischemic changes, without visible change since 05/17/2016.  MR angiography does not show any anterior circulation large or medium vessel lesion. More distal branch vessels show atherosclerotic irregularity and narrowing, more notable in the left MCA and ACA regions. The study suffers from motion degradation. The posterior circulation is quite diminutive secondary to fetal origin of the posterior cerebral arteries.  CT head:  IMPRESSION: 1. No acute intracranial infarct or other process identified 2. ASPECTS is 10. 3. Stable atrophy with chronic small vessel ischemic disease. Critical Value/emergent results were called by telephone at the time of interpretation on 09/28/2016 at 6:52 am to Dr. Leonel Ramsay, who verbally acknowledged these results.  Smiley Houseman, MD 09/28/2016, 12:49 PM PGY-3, [place] Intern pager: [phone],  text pages welcome

## 2016-09-28 NOTE — Consult Note (Signed)
Neurology Consultation Reason for Consult: Aphasia Referring Physician: [facility], K  CC: Aphasia  History is obtained from:patient  HPI: [name] Marks is a 61 y.o. female with a history of ESRD who presents with difficulty speaking that started this AM. She last spoke normally last night and then woke up this morning feeling normal. The first time that she tried to say anything she was having significant difficulty. She then called her son and had trouble speaking at that time as well. She was brought to the ER where a right facial droop and aphasia were noted. She was out of the tpa window and has had anaphylaxis to IV contrast so she was taken for stat MRI. This was negative.    LKW: prior to bed 8/2 tpa given?: no, out of window.    ROS: A 14 point ROS was performed and is negative except as noted in the HPI.   Past Medical History:  Diagnosis Date  . Complication of anesthesia    due to kidney disease  . End stage renal disease on dialysis ([place])   . GERD (gastroesophageal reflux disease)   . Gout      Family History  Problem Relation Age of Onset  . Diabetes Mother   . Hypertension Mother   . Heart disease Mother   . Alcohol abuse Mother   . Kidney disease Mother   . Cancer Father        COLON  . Alcohol abuse Father   . Breast cancer Sister 69  . Hypertension Sister   . Kidney disease Sister   . Hypertension Son   . Kidney disease Son      Social History:  reports that she has never smoked. She has never used smokeless tobacco. She reports that she does not drink alcohol or use drugs.   Exam: Current vital signs: Ht 5\' 6"  (1.676 m)   Wt 82.4 kg (181 lb 9 oz)   BMI 29.30 kg/m  Vital signs in last 24 hours: Weight:  [82.4 kg (181 lb 9 oz)] 82.4 kg (181 lb 9 oz) (08/03 0701)   Physical Exam  Constitutional: Appears well-developed and well-nourished.  Psych: Affect appropriate to situation Eyes: No scleral injection HENT: No OP obstrucion Head:  Normocephalic.  Cardiovascular: Normal rate and regular rhythm.  Respiratory: Effort normal and breath sounds normal to anterior ascultation GI: Soft.  No distension. There is no tenderness.  Skin: WDI  Neuro: Mental Status: Patient is awake, alert, she has a moderate mixed aphasia, but this rapidly improves over the course of the exam.  Patient is able to give a clear and coherent history. No signs neglect Cranial Nerves: II: Visual Fields are full. Pupils are equal, round, and reactive to light.   III,IV, VI: EOMI without ptosis or diploplia.   V: Facial sensation is symmetric to temperature VII: Facial movement with right lower facial droop.  VIII: hearing is intact to voice X: Uvula elevates symmetrically XI: Shoulder shrug is symmetric. XII: tongue is midline without atrophy or fasciculations.  Motor: Tone is normal. Bulk is normal. 5/5 strength was present in all four extremities.  Sensory: Sensation is symmetric to light touch and temperature in the arms and legs. Cerebellar: FNF intact bilaterally  I have reviewed labs in epic and the results pertinent to this consultation are: Elevated creatinine  I have reviewed the images obtained:MRI - no diffusion change, MRA - no acute LVO  Impression: 61 yo F with rapidly improving right facial droop  and aphasia. This is rapidly improving most consistent with TIA. MRI was negative for LVO or ischemic stroke.   Recommendations: 1. HgbA1c, fasting lipid panel 2. MRI, MRA  of the brain without contrast 3. Frequent neuro checks 4. Echocardiogram 5. Carotid dopplers 6. Prophylactic therapy-Antiplatelet med: Aspirin - dose 325mg  PO or 300mg  PR 7. Risk factor modification 8. Telemetry monitoring 9. PT consult, OT consult, Speech consult 10. please page stroke NP  Or  PA  Or MD  from 8am -4 pm as this patient will be followed by the stroke team at this point.   You can look them up on [email or web address]     Roland Rack,  MD [facility] [phone]  If 7pm- 7am, please page neurology on call as listed in [place].

## 2016-09-28 NOTE — Progress Notes (Signed)
MD paged for diet order, patient is NPO for swallow evaluation per notes but swallow eval by bedside RN in ED is charted, and patient appears to be back to baseline. Awaiting orders.

## 2016-09-28 NOTE — ED Notes (Signed)
Admitting at bedside 

## 2016-09-28 NOTE — ED Triage Notes (Addendum)
Pt arrives via [facility], [facility] at 5am when she woke up with ashapagia and slurred speech which has now resolved, minimal  R sided weakness and facial droop noted. Pt is a dialysis pt and receives dialysis on MWF

## 2016-09-29 ENCOUNTER — Observation Stay (HOSPITAL_BASED_OUTPATIENT_CLINIC_OR_DEPARTMENT_OTHER): Payer: Medicare Other

## 2016-09-29 DIAGNOSIS — N2581 Secondary hyperparathyroidism of renal origin: Secondary | ICD-10-CM | POA: Diagnosis not present

## 2016-09-29 DIAGNOSIS — D631 Anemia in chronic kidney disease: Secondary | ICD-10-CM | POA: Diagnosis not present

## 2016-09-29 DIAGNOSIS — Z992 Dependence on renal dialysis: Secondary | ICD-10-CM | POA: Diagnosis not present

## 2016-09-29 DIAGNOSIS — I120 Hypertensive chronic kidney disease with stage 5 chronic kidney disease or end stage renal disease: Secondary | ICD-10-CM | POA: Diagnosis not present

## 2016-09-29 DIAGNOSIS — R4701 Aphasia: Secondary | ICD-10-CM

## 2016-09-29 DIAGNOSIS — N186 End stage renal disease: Secondary | ICD-10-CM | POA: Diagnosis not present

## 2016-09-29 LAB — RENAL FUNCTION PANEL
Albumin: 3.1 g/dL — ABNORMAL LOW (ref 3.5–5.0)
Anion gap: 9 (ref 5–15)
BUN: 9 mg/dL (ref 6–20)
CHLORIDE: 99 mmol/L — AB (ref 101–111)
CO2: 27 mmol/L (ref 22–32)
CREATININE: 4.1 mg/dL — AB (ref 0.44–1.00)
Calcium: 9.5 mg/dL (ref 8.9–10.3)
GFR calc Af Amer: 12 mL/min — ABNORMAL LOW (ref >60)
GFR calc non Af Amer: 11 mL/min — ABNORMAL LOW (ref >60)
GLUCOSE: 84 mg/dL (ref 65–99)
POTASSIUM: 4 mmol/L (ref 3.5–5.1)
Phosphorus: 2.7 mg/dL (ref 2.5–4.6)
Sodium: 135 mmol/L (ref 135–145)

## 2016-09-29 LAB — LIPID PANEL
CHOL/HDL RATIO: 2.2 ratio
CHOLESTEROL: 147 mg/dL (ref 0–200)
HDL: 66 mg/dL (ref >40)
LDL CALC: 64 mg/dL (ref 0–99)
TRIGLYCERIDES: 83 mg/dL (ref <150)
VLDL: 17 mg/dL (ref 0–40)

## 2016-09-29 MED ORDER — CLOPIDOGREL BISULFATE 75 MG PO TABS
75.0000 mg | ORAL_TABLET | Freq: Every day | ORAL | Status: DC
  Administered 2016-09-29 – 2016-10-02 (×4): 75 mg via ORAL
  Filled 2016-09-29 (×4): qty 1

## 2016-09-29 NOTE — Progress Notes (Signed)
Family Medicine Teaching Service Daily Progress Note Intern Pager: [phone]  Patient name: [name] Marks Medical record number: [id number] Date of birth: 1955/04/10 Age: 61 y.o. Gender: female  Primary Care Provider: Tonette Bihari, MD Consultants: Neurology, Nephrology Code Status: FULL  Pt Overview and Major Events to Date:  Admitted on 8/3, MRI negative for acute intracranial infarction or bleed  Assessment and Plan:  [name] Marks is a 61 y.o. female presenting with expressive aphasia and facial droop. PMH is significant for hx TIA, hx of aortic valve replacement with bioprosthetic valve, ESRD on HD M,W,F; Obesity.   TIA: Expressive aphasia has improved but still has some issues finding words. Otherwise neuro exam is unremarkable. CT head negative for acute infarct. MRI brain without acute infarct. MRA head.  ECHO completed in 04/2016 with normal EF and dilated right ventricle.  Cholesterol 147 Triglycerides 83 HDL 66 LDL 64 - neurology following, appreciate recommendations - neuro checks per protocol  - Carotid Dopplers - ASA 325mg  daily - PT/OT/SLP - Echo - hemoglobin A1c pending  ESRD: HD M,W,F. Electrolytes stable on admit blood work.  - nephrology consulted - continue home Renvela and Sensipar    FEN/GI: renal/carb modified, pepcid Prophylaxis: heparin SQ  Disposition: continue on telemetry  Subjective:  Patient says she feels well and that her speech is normal.  She says she is a Tuesday, Thursday, Saturday dialysis patient, although she received dialysis yesterday.  Denies focal weakness.  Understands plan for carotid doppler today.  Objective: Temp:  [97.5 F (36.4 C)-98.6 F (37 C)] 98.2 F (36.8 C) (08/04 0526) Pulse Rate:  [61-83] 77 (08/04 0526) Resp:  [13-23] 19 (08/04 0526) BP: (122-176)/(51-107) 136/68 (08/04 0526) SpO2:  [99 %-100 %] 100 % (08/04 0526) Weight:  [168 lb 10.4 oz (76.5 kg)-181 lb 9 oz (82.4 kg)] 168 lb 10.4 oz (76.5 kg) (08/03  2142) Physical Exam: General: elderly lady sleeping in bed, no facial asymmer Cardiovascular: RRR, no MRG Respiratory: CTAB Abdomen: +bowel sounds, nontender to palpation Extremities: no edema  Laboratory:  Recent Labs Lab 09/28/16 0632 09/28/16 0637  WBC 4.8  --   HGB 11.0* 11.6*  HCT 33.7* 34.0*  PLT 190  --     Recent Labs Lab 09/28/16 0632 09/28/16 0637 09/29/16 0531  NA 141 142 135  K 3.8 3.7 4.0  CL 105 104 99*  CO2 26  --  27  BUN 24* 25* 9  CREATININE 6.25* 6.20* 4.10*  CALCIUM 9.4  --  9.5  PROT 6.7  --   --   BILITOT 0.6  --   --   ALKPHOS 239*  --   --   ALT 23  --   --   AST 32  --   --   GLUCOSE 88 86 84     Imaging/Diagnostic Tests: Mr [facility] Contrast  Result Date: 09/28/2016 CLINICAL DATA:  Acute presentation or speech disturbance and right-sided weakness. EXAM: MRI HEAD WITHOUT CONTRAST MRA HEAD WITHOUT CONTRAST TECHNIQUE: Multiplanar, multiecho pulse sequences of the brain and surrounding structures were obtained without intravenous contrast. Angiographic images of the head were obtained using MRA technique without contrast. COMPARISON:  CT same day.  MRI 05/17/2016. FINDINGS: MRI HEAD FINDINGS Brain: Diffusion imaging does not show any acute or subacute infarction. The brainstem is normal. There is cerebellar atrophy. Cerebral hemispheres show atrophy with moderate chronic small-vessel ischemic changes affecting the deep and subcortical white matter. No visible change since the study of March. No  mass lesion, hemorrhage, hydrocephalus or extra-axial collection. Vascular: Major vessels at the base of the brain show flow. Skull and upper cervical spine: Negative Sinuses/Orbits: Clear/normal Other: None significant MRA HEAD FINDINGS Motion degraded study compared to the previous exam. Both internal carotid arteries are widely patent through the skullbase and siphon regions. The anterior and middle cerebral vessels are patent without proximal stenosis,  aneurysm or vascular malformation. More distal branch vessels show atherosclerotic irregularity, more notable in the left anterior cerebral and middle cerebral branches. Fetal origin posterior cerebral arteries bilaterally. Posterior circulation is very diminutive. Small vertebral arteries. Left vertebral artery terminates in PICA. Right vertebral artery supplies a small basilar. Limited detail because of the motion degradation. IMPRESSION: No sign of acute or subacute infarction. Atrophy and moderate chronic small-vessel ischemic changes, without visible change since 05/17/2016. MR angiography does not show any anterior circulation large or medium vessel lesion. More distal branch vessels show atherosclerotic irregularity and narrowing, more notable in the left MCA and ACA regions. The study suffers from motion degradation. The posterior circulation is quite diminutive secondary to fetal origin of the posterior cerebral arteries. Electronically Signed   By: Nelson Chimes M.D.   On: 09/28/2016 08:10   Mr Brain Wo Contrast  Result Date: 09/28/2016 CLINICAL DATA:  Acute presentation or speech disturbance and right-sided weakness. EXAM: MRI HEAD WITHOUT CONTRAST MRA HEAD WITHOUT CONTRAST TECHNIQUE: Multiplanar, multiecho pulse sequences of the brain and surrounding structures were obtained without intravenous contrast. Angiographic images of the head were obtained using MRA technique without contrast. COMPARISON:  CT same day.  MRI 05/17/2016. FINDINGS: MRI HEAD FINDINGS Brain: Diffusion imaging does not show any acute or subacute infarction. The brainstem is normal. There is cerebellar atrophy. Cerebral hemispheres show atrophy with moderate chronic small-vessel ischemic changes affecting the deep and subcortical white matter. No visible change since the study of March. No mass lesion, hemorrhage, hydrocephalus or extra-axial collection. Vascular: Major vessels at the base of the brain show flow. Skull and upper  cervical spine: Negative Sinuses/Orbits: Clear/normal Other: None significant MRA HEAD FINDINGS Motion degraded study compared to the previous exam. Both internal carotid arteries are widely patent through the skullbase and siphon regions. The anterior and middle cerebral vessels are patent without proximal stenosis, aneurysm or vascular malformation. More distal branch vessels show atherosclerotic irregularity, more notable in the left anterior cerebral and middle cerebral branches. Fetal origin posterior cerebral arteries bilaterally. Posterior circulation is very diminutive. Small vertebral arteries. Left vertebral artery terminates in PICA. Right vertebral artery supplies a small basilar. Limited detail because of the motion degradation. IMPRESSION: No sign of acute or subacute infarction. Atrophy and moderate chronic small-vessel ischemic changes, without visible change since 05/17/2016. MR angiography does not show any anterior circulation large or medium vessel lesion. More distal branch vessels show atherosclerotic irregularity and narrowing, more notable in the left MCA and ACA regions. The study suffers from motion degradation. The posterior circulation is quite diminutive secondary to fetal origin of the posterior cerebral arteries. Electronically Signed   By: Nelson Chimes M.D.   On: 09/28/2016 08:10   Ct Head Code Stroke Wo Contrast  Result Date: 09/28/2016 CLINICAL DATA:  Code stroke. Initial evaluation for acute speech difficulty, right-sided weakness. EXAM: CT HEAD WITHOUT CONTRAST TECHNIQUE: Contiguous axial images were obtained from the base of the skull through the vertex without intravenous contrast. COMPARISON:  Prior CT from 05/16/2016 as well as previous MRI from 05/17/2016. FINDINGS: Brain: Stable atrophy with moderate chronic small vessel ischemic  disease. No acute intracranial hemorrhage. No evidence for acute large vessel territory infarct. No mass lesion, midline shift or mass effect.  Ventricular prominence related to global parenchymal volume loss of hydrocephalus. No extra-axial fluid collection. Vascular: No hyperdense vessel. Scattered vascular calcifications noted within the carotid siphons. Skull: Scalp soft tissues and calvarium within normal limits. Sinuses/Orbits: Globes and orbital soft tissues within normal limits. Paranasal sinuses are clear. No mastoid effusion. ASPECTS [facility] Stroke Program Early CT Score) - Ganglionic level infarction (caudate, lentiform nuclei, internal capsule, insula, M1-M3 cortex): 7 - Supraganglionic infarction (M4-M6 cortex): 3 Total score (0-10 with 10 being normal): 10 IMPRESSION: 1. No acute intracranial infarct or other process identified 2. ASPECTS is 10. 3. Stable atrophy with chronic small vessel ischemic disease. Critical Value/emergent results were called by telephone at the time of interpretation on 09/28/2016 at 6:52 am to Dr. Leonel Ramsay, who verbally acknowledged these results. Electronically Signed   By: Jeannine Boga M.D.   On: 09/28/2016 06:53    Winfrey, Alcario Drought, MD 09/29/2016, 6:50 AM PGY-1, [place] Intern pager: [phone], text pages welcome

## 2016-09-29 NOTE — Progress Notes (Signed)
STROKE TEAM PROGRESS NOTE       HISTORY OF PRESENT ILLNESS (per record) Darcee A [name] is a 61 y.o. female with a history of ESRD who presents with difficulty speaking that started this AM. She last spoke normally last night and then woke up this morning feeling normal. The first time that she tried to say anything she was having significant difficulty. She then called her son and had trouble speaking at that time as well. She was brought to the ER where a right facial droop and aphasia were noted. She was out of the tpa window and has had anaphylaxis to IV contrast so she was taken for stat MRI. This was negative.    LKW: prior to bed 8/2 tpa given?: no, out of window.    SUBJECTIVE (INTERVAL HISTORY) No complaints.   OBJECTIVE Temp:  [98 F (36.7 C)-98.6 F (37 C)] 98 F (36.7 C) (08/04 0949) Pulse Rate:  [65-81] 73 (08/04 0949) Cardiac Rhythm: Normal sinus rhythm (08/04 0733) Resp:  [18-19] 18 (08/04 0949) BP: (122-159)/(51-83) 145/69 (08/04 0949) SpO2:  [100 %] 100 % (08/04 0949) Weight:  [168 lb 10.4 oz (76.5 kg)-168 lb 14 oz (76.6 kg)] 168 lb 10.4 oz (76.5 kg) (08/03 2142)  CBC:   Recent Labs Lab 09/28/16 0632 09/28/16 0637  WBC 4.8  --   NEUTROABS 2.3  --   HGB 11.0* 11.6*  HCT 33.7* 34.0*  MCV 83.8  --   PLT 190  --     Basic Metabolic Panel:   Recent Labs Lab 09/28/16 0632 09/28/16 0637 09/29/16 0531  NA 141 142 135  K 3.8 3.7 4.0  CL 105 104 99*  CO2 26  --  27  GLUCOSE 88 86 84  BUN 24* 25* 9  CREATININE 6.25* 6.20* 4.10*  CALCIUM 9.4  --  9.5  PHOS  --   --  2.7    Lipid Panel:     Component Value Date/Time   CHOL 147 09/29/2016 0531   TRIG 83 09/29/2016 0531   HDL 66 09/29/2016 0531   CHOLHDL 2.2 09/29/2016 0531   VLDL 17 09/29/2016 0531   LDLCALC 64 09/29/2016 0531   HgbA1c:  Lab Results  Component Value Date   HGBA1C 4.3 05/29/2016   Urine Drug Screen: No results found for: LABOPIA, COCAINSCRNUR, LABBENZ, AMPHETMU, THCU,  LABBARB  Alcohol Level     Component Value Date/Time   [facility] <5 09/28/2016 1062    IMAGING  Mr [name] Head Wo Contrast 09/28/2016 No sign of acute or subacute infarction. Atrophy and moderate chronic small-vessel ischemic changes, without visible change since 05/17/2016. MR angiography does not show any anterior circulation large or medium vessel lesion. More distal branch vessels show atherosclerotic irregularity and narrowing, more notable in the left MCA and ACA regions. The study suffers from motion degradation. The posterior circulation is quite diminutive secondary to fetal origin of the posterior cerebral arteries.     Ct Head Code Stroke Wo Contrast 09/28/2016 1. No acute intracranial infarct or other process identified  2. ASPECTS is 10.  3. Stable atrophy with chronic small vessel ischemic disease.    PHYSICAL EXAM Awake, alert, fully oriented. Face symmetrical.  No dysarthria.  Fluent. Naming, repetition, comprehension- normal. BUE and BLE 5/5. Coordination and sensory - intact. Gait- normal. No babinski.        ASSESSMENT/PLAN Ms. Euva A [name] is a 61 y.o. female with history of end-stage renal disease, gout, and previous staph aureus bacteremia  presenting with facial droop and speech difficulties. She did not receive IV t-PA due to late presentation.  Possible TIA:    Resultant  Right facial droop and slurred speech.  CT head - No acute intracranial infarct or other process identified   MRI head - No sign of acute or subacute infarction.   MRA head - no significant stenosis noted.  Carotid Doppler - pending  2D Echo - pending  LDL - 64  HgbA1c - pending  VTE prophylaxis - subcutaneous heparin Diet renal/carb modified with fluid restriction Diet-HS Snack? Nothing; Room service appropriate? Yes; Fluid consistency: Thin  aspirin 325 mg daily prior to admission, now on aspirin 325 mg daily  Patient counseled to be compliant with her antithrombotic  medications  Ongoing aggressive stroke risk factor management  Therapy recommendations:  Home health physical therapy recommended. OT evaluation pending.  Disposition:  Pending  Hypertension  Stable  Permissive hypertension (OK if < 220/120) but gradually normalize in 5-7 days  Long-term BP goal normotensive  Hyperlipidemia  Home meds:  No lipid lowering medications prior to admission  LDL 64, goal < 70   Other Stroke Risk Factors  Advanced age  Overweight, Body mass index is 27.22 kg/m., recommend weight loss, diet and exercise as appropriate    Other Active Problems  ESRD  Mild anemia  Hospital day # 0  Attending Addendum: 10 minute episode of right facial droop with dysarthria.  History does not suggest aphasia.  Completely resolved.  No arm or leg weakness or numbness.  MRI is normal.  Possible ICA stenosis with artery atheroembolism.  Awaiting CDUS.  Possibility of cardioembolism exists given prosthetic valve but awaiting TTE.  Consider TEE as well if TTE is negative given history of endocarditis.  BP is OK.  LDL is low.  No DM.  No smoking.    Since on ASA prior to admission, recommend changing to Plavix.    [place]ogue Jury, MS, MD   To contact Stroke Continuity provider, please refer to [email or web address] After hours, contact General Neurology

## 2016-09-29 NOTE — Evaluation (Signed)
Physical Therapy Evaluation Patient Details Name: [name] Marks MRN: [id number] DOB: Mar 14, 1955 Today's Date: 09/29/2016   History of Present Illness  Pt is a 61 y/o female admitted secondary to aphasia. MRI was negative. PMH including but not limited to ESRD (HD M-W-F), HTN, CVA, AVR 2017.  Clinical Impression  Pt presented supine in bed with HOB elevated, awake and willing to participate in therapy session. Prior to admission, pt reported that she was mod I with use of SPC to ambulate and independent with ADLs. Pt ambulated 6' with RW and min guard for safety. Pt stated that she is limited with ambulation at times secondary to fatigue and bilateral LE pain. Pt is very interested in [facility] PT services again. Pt would continue to benefit from skilled physical therapy services at this time while admitted and after d/c to address the below listed limitations in order to improve overall safety and independence with functional mobility.     Follow Up Recommendations Home health PT    Equipment Recommendations  None recommended by PT    Recommendations for Other Services       Precautions / Restrictions Restrictions Weight Bearing Restrictions: No      Mobility  Bed Mobility Overal bed mobility: Modified Independent                Transfers Overall transfer level: Needs assistance Equipment used: None Transfers: Sit to/from Stand Sit to Stand: Supervision         General transfer comment: increased time, supervision for safety  Ambulation/Gait Ambulation/Gait assistance: Min guard Ambulation Distance (Feet): 50 Feet Assistive device: Rolling walker (2 wheeled) Gait Pattern/deviations: Step-through pattern;Decreased stride length;Trunk flexed Gait velocity: decreased Gait velocity interpretation: Below normal speed for age/gender General Gait Details: mild instability but no overt LOB or need for physical assistance, min guard for safety  Stairs             Wheelchair Mobility    Modified Rankin (Stroke Patients Only)       Balance Overall balance assessment: Needs assistance Sitting-balance support: Feet supported Sitting balance-Leahy Scale: Normal Sitting balance - Comments: pt able to don socks sitting EOB with supervision   Standing balance support: During functional activity;No upper extremity supported Standing balance-Leahy Scale: Fair                               Pertinent Vitals/Pain Pain Assessment: 0-10 Pain Score: 5  Pain Location: bilateral LEs Pain Descriptors / Indicators: Sore Pain Intervention(s): Monitored during session;Repositioned    Home Living Family/patient expects to be discharged to:: Private residence Living Arrangements: Alone Available Help at Discharge: Family;Available PRN/intermittently Type of Home: House Home Access: Stairs to enter Entrance Stairs-Rails: None Entrance Stairs-Number of Steps: 3 Home Layout: One level Home Equipment: Walker - 2 wheels;Cane - single point;Wheelchair - manual;Bedside commode      Prior Function Level of Independence: Independent with assistive device(s)         Comments: pt ambulates with use of SPC; son drives pt to dialysis     Hand Dominance   Dominant Hand: Left    Extremity/Trunk Assessment   Upper Extremity Assessment Upper Extremity Assessment: Defer to OT evaluation    Lower Extremity Assessment Lower Extremity Assessment: Generalized weakness       Communication   Communication: Expressive difficulties  Cognition Arousal/Alertness: Awake/alert Behavior During Therapy: WFL for tasks assessed/performed Overall Cognitive Status: Within Functional Limits for tasks  assessed                                        General Comments      Exercises     Assessment/Plan    PT Assessment Patient needs continued PT services  PT Problem List Decreased strength;Decreased activity tolerance;Decreased  balance;Decreased mobility;Decreased coordination;Decreased knowledge of use of DME;Decreased safety awareness       PT Treatment Interventions DME instruction;Gait training;Stair training;Functional mobility training;Therapeutic activities;Therapeutic exercise;Balance training;Neuromuscular re-education;Patient/family education    PT Goals (Current goals can be found in the Care Plan section)  Acute Rehab PT Goals Patient Stated Goal: return home PT Goal Formulation: With patient Time For Goal Achievement: 10/13/16 Potential to Achieve Goals: Good    Frequency Min 3X/week   Barriers to discharge        Co-evaluation               AM-PAC PT "6 Clicks" Daily Activity  Outcome Measure Difficulty turning over in bed (including adjusting bedclothes, sheets and blankets)?: None Difficulty moving from lying on back to sitting on the side of the bed? : None Difficulty sitting down on and standing up from a chair with arms (e.g., wheelchair, bedside commode, etc,.)?: A Little Help needed moving to and from a bed to chair (including a wheelchair)?: A Little Help needed walking in hospital room?: A Little Help needed climbing 3-5 steps with a railing? : A Little 6 Click Score: 20    End of Session   Activity Tolerance: Patient tolerated treatment well Patient left: in bed;with call bell/phone within reach Nurse Communication: Mobility status PT Visit Diagnosis: Unsteadiness on feet (R26.81);Other abnormalities of gait and mobility (R26.89)    Time: 1224-8250 PT Time Calculation (min) (ACUTE ONLY): 23 min   Charges:   PT Evaluation $PT Eval Moderate Complexity: 1 Mod PT Treatments $Therapeutic Activity: 8-22 mins   PT G Codes:   PT G-Codes **NOT FOR INPATIENT CLASS** Functional Assessment Tool Used: AM-PAC 6 Clicks Basic Mobility;Clinical judgement Functional Limitation: Mobility: Walking and moving around Mobility: Walking and Moving Around Current Status (I3704): At  least 20 percent but less than 40 percent impaired, limited or restricted Mobility: Walking and Moving Around Goal Status [phone]): 0 percent impaired, limited or restricted    [facility], PT, DPT [place] 09/29/2016, 12:56 PM

## 2016-09-29 NOTE — Progress Notes (Signed)
VASCULAR LAB PRELIMINARY  PRELIMINARY  PRELIMINARY  PRELIMINARY  Carotid duplex completed.    Preliminary report:  1-39% ICA plaquing. Vertebral artery flow is antegrade.   Chaden Doom, RVT 09/29/2016, 10:25 AM

## 2016-09-29 NOTE — Evaluation (Signed)
Occupational Therapy Evaluation Patient Details Name: [name] Marks MRN: [id number] DOB: 01-20-1956 Today's Date: 09/29/2016    History of Present Illness Pt is a 61 y/o female admitted secondary to aphasia. MRI was negative. PMH including but not limited to ESRD (HD M-W-F), HTN, CVA, AVR 2017.   Clinical Impression   PTA, pt was living alone and performing her ADLs and simple IADLs. Pt currently performing ADLs and functional mobility at supervision-Min guard level. Pt demonstrating decreased activity tolerance and quickly fatigues. Provided education and handouts on energy conservation and fall prevention to increase pt safety and independence. Pt would benefit from acute OT to increase functional performance and facilitate safe dc. Recommend dc home once medically stable per physician.     Follow Up Recommendations  No OT follow up;Supervision - Intermittent    Equipment Recommendations  None recommended by OT    Recommendations for Other Services       Precautions / Restrictions Restrictions Weight Bearing Restrictions: No      Mobility Bed Mobility Overal bed mobility: Modified Independent             General bed mobility comments: Increased time  Transfers Overall transfer level: Needs assistance Equipment used: None Transfers: Sit to/from Stand Sit to Stand: Supervision         General transfer comment: increased time, supervision for safety    Balance Overall balance assessment: Needs assistance Sitting-balance support: Feet supported Sitting balance-Leahy Scale: Normal Sitting balance - Comments: pt able to don socks sitting EOB with supervision   Standing balance support: During functional activity;No upper extremity supported Standing balance-Leahy Scale: Fair                             ADL either performed or assessed with clinical judgement   ADL Overall ADL's : Needs assistance/impaired Eating/Feeding: Set up;Sitting    Grooming: Set up;Sitting   Upper Body Bathing: Set up;Sitting   Lower Body Bathing: Min guard;Sit to/from stand   Upper Body Dressing : Set up;Sitting   Lower Body Dressing: Min guard;Sit to/from stand   Toilet Transfer: Ambulation;RW;Supervision/safety (Simulated to recliner)         Tub/Shower Transfer Details (indicate cue type and reason): Pt sponge baths by sing at home due to HD cath Functional mobility during ADLs: Supervision/safety;Rolling walker General ADL Comments: Fatigued quickly with activity     Vision         Perception     Praxis      Pertinent Vitals/Pain Faces Pain Scale: Hurts a little bit Pain Location: bilateral LEs Pain Descriptors / Indicators: Sore Pain Intervention(s): Monitored during session     Hand Dominance Left   Extremity/Trunk Assessment Upper Extremity Assessment Upper Extremity Assessment: Generalized weakness   Lower Extremity Assessment Lower Extremity Assessment: Defer to PT evaluation       Communication Communication Communication: Expressive difficulties;Other (comment) (Some difficulties with word finding)   Cognition Arousal/Alertness: Awake/alert Behavior During Therapy: WFL for tasks assessed/performed Overall Cognitive Status: Within Functional Limits for tasks assessed                                     General Comments  Pt reporting that when she left the hospital last time she fell frequently. Provided pt with handouts on energy conservation and fall prevention. Reviewed information and discussed how pt may apply techniques  to daily life. Pt verbalizing understanding.     Exercises     Shoulder Instructions      Home Living Family/patient expects to be discharged to:: Private residence Living Arrangements: Alone Available Help at Discharge: Family;Available PRN/intermittently Type of Home: House Home Access: Stairs to enter [employer] of Steps: 3 Entrance Stairs-Rails:  None Home Layout: One level     Bathroom Shower/Tub: Teacher, early years/pre: Standard Bathroom Accessibility: Yes How Accessible: Accessible via walker Home Equipment: Pender - 2 wheels;Cane - single point;Wheelchair - manual;Bedside commode          Prior Functioning/Environment Level of Independence: Independent with assistive device(s)        Comments: Pt performs her ADLs and IADLs. Uses SPC for functional mobility and her son drives her for HD        OT Problem List: Decreased activity tolerance;Impaired balance (sitting and/or standing);Decreased knowledge of use of DME or AE;Pain      OT Treatment/Interventions: Self-care/ADL training;Therapeutic exercise;Energy conservation;DME and/or AE instruction;Therapeutic activities;Patient/family education    OT Goals(Current goals can be found in the care plan section) Acute Rehab OT Goals Patient Stated Goal: return home OT Goal Formulation: With patient Time For Goal Achievement: 10/13/16 Potential to Achieve Goals: Good ADL Goals Additional ADL Goal #1: Pt will indepdently verbalize three energy conservation techniques Additional ADL Goal #2: Pt will increase activity tolerance as seen by maintaining standing during an ADL/IADLs for 8-10 min  OT Frequency: Min 2X/week   Barriers to D/C:            Co-evaluation              AM-PAC PT "6 Clicks" Daily Activity     Outcome Measure Help from another person eating meals?: None Help from another person taking care of personal grooming?: None Help from another person toileting, which includes using toliet, bedpan, or urinal?: A Little Help from another person bathing (including washing, rinsing, drying)?: A Little Help from another person to put on and taking off regular upper body clothing?: None Help from another person to put on and taking off regular lower body clothing?: A Little 6 Click Score: 21   End of Session Equipment Utilized During  Treatment: Rolling walker Nurse Communication: Mobility status  Activity Tolerance: Patient tolerated treatment well Patient left: in chair;with call bell/phone within reach  OT Visit Diagnosis: Unsteadiness on feet (R26.81);Pain;Muscle weakness (generalized) (M62.81) Pain - Right/Left:  (Bil LE) Pain - part of body: Leg                Time: 1315-1341 OT Time Calculation (min): 26 min Charges:  OT General Charges $OT Visit: 1 Procedure OT Evaluation $OT Eval Low Complexity: 1 Procedure OT Treatments $Self Care/Home Management : 8-22 mins G-Codes: OT G-codes **NOT FOR INPATIENT CLASS** Functional Assessment Tool Used: Clinical judgement Functional Limitation: Self care Self Care Current Status (I9485): At least 1 percent but less than 20 percent impaired, limited or restricted Self Care Goal Status (I6270): 0 percent impaired, limited or restricted   [place], OTR/L Acute Rehab Pager: [phone] Office: [place] 09/29/2016, 5:10 PM

## 2016-09-29 NOTE — Progress Notes (Signed)
SLP Cancellation Note  Patient Details Name: Arlenne A [name] MRN: [id number] DOB: 1955/04/09   Cancelled treatment:       Reason Eval/Treat Not Completed: Patient at procedure or test/unavailable. Will re-attempt SLP cognitive-linguistic evaluation at a later time.   Germain Osgood 09/29/2016, 10:27 AM  Germain Osgood, M.A. CCC-SLP [phone]

## 2016-09-29 NOTE — Progress Notes (Signed)
[facility] Progress Note  Subjective: feels good, no new c/o  Vitals:   09/28/16 1800 09/28/16 2142 09/29/16 0526 09/29/16 0949  BP: (!) 122/51 (!) 124/51 136/68 (!) 145/69  Pulse: 76 78 77 73  Resp: 18 19 19 18   Temp: 98.2 F (36.8 C) 98.4 F (36.9 C) 98.2 F (36.8 C) 98 F (36.7 C)  TempSrc: Oral Oral Oral Oral  SpO2: 100% 100% 100% 100%  Weight:  76.5 kg (168 lb 10.4 oz)    Height:        Inpatient medications: .  stroke: mapping our early stages of recovery book   Does not apply Once  . aspirin  325 mg Oral Daily  . cinacalcet  60 mg Oral Q breakfast  . famotidine  20 mg Oral Daily  . feeding supplement (NEPRO CARB STEADY)  237 mL Oral Daily  . heparin  5,000 Units Subcutaneous Q8H  . sevelamer carbonate  1,600 mg Oral TID WC    midodrine, senna-docusate, witch hazel-glycerin  Exam: Alert, no distress No facial droop or aphasia Chest clear bilat RRR no mrg aBd soft ntnd no ascites Ext no edema L IJ TDC  [facility]: [facility] M[facility] 4h  80kg  2/2.25 baht  P2  Hep 6000 +2500 midrun  L IJ cath -Mircera 100 mcg IV Q 2 weeks (last dose 09/26/16) -Venofer 100 mg IV X 5 doses (1/5 given last dose 09/26/16) -Calcitriol 0.25 mcg PO TIW Renvela 800 mg 2 tabs PO TIW AC Senispar 60mg  po daily      Impression: 1  TIA - w/ R facial droop/ aphasia.   MRI neg for CVA.  Per primary 2  ESRD on HD MWF 3  Anemia of CKD 4  MBD  Plan - per primary.   [name] MD [facility] pager [phone]   09/29/2016, 10:32 AM    Recent Labs Lab 09/28/16 [phone] 09/28/16 0637 09/29/16 0531  NA 141 142 135  K 3.8 3.7 4.0  CL 105 104 99*  CO2 26  --  27  GLUCOSE 88 86 84  BUN 24* 25* 9  CREATININE 6.25* 6.20* 4.10*  CALCIUM 9.4  --  9.5  PHOS  --   --  2.7    Recent Labs Lab 09/28/16 0632 09/29/16 0531  AST 32  --   ALT 23  --   ALKPHOS 239*  --   BILITOT 0.6  --   PROT 6.7  --   ALBUMIN 3.3* 3.1*    Recent Labs Lab 09/28/16 0632  09/28/16 0637  WBC 4.8  --   NEUTROABS 2.3  --   HGB 11.0* 11.6*  HCT 33.7* 34.0*  MCV 83.8  --   PLT 190  --    Iron/TIBC/Ferritin/ %Sat    Component Value Date/Time   IRON 16 (L) 02/16/2016 1213   TIBC 99 (L) 02/16/2016 1213   FERRITIN 942 (H) 02/16/2016 1213   IRONPCTSAT 16 02/16/2016 1213

## 2016-09-30 ENCOUNTER — Observation Stay (HOSPITAL_BASED_OUTPATIENT_CLINIC_OR_DEPARTMENT_OTHER): Payer: Medicare Other

## 2016-09-30 DIAGNOSIS — Z992 Dependence on renal dialysis: Secondary | ICD-10-CM | POA: Diagnosis not present

## 2016-09-30 DIAGNOSIS — N186 End stage renal disease: Secondary | ICD-10-CM | POA: Diagnosis not present

## 2016-09-30 DIAGNOSIS — I340 Nonrheumatic mitral (valve) insufficiency: Secondary | ICD-10-CM

## 2016-09-30 DIAGNOSIS — D631 Anemia in chronic kidney disease: Secondary | ICD-10-CM | POA: Diagnosis not present

## 2016-09-30 DIAGNOSIS — Z952 Presence of prosthetic heart valve: Secondary | ICD-10-CM | POA: Diagnosis not present

## 2016-09-30 DIAGNOSIS — R4701 Aphasia: Secondary | ICD-10-CM | POA: Diagnosis not present

## 2016-09-30 DIAGNOSIS — N2581 Secondary hyperparathyroidism of renal origin: Secondary | ICD-10-CM | POA: Diagnosis not present

## 2016-09-30 DIAGNOSIS — G459 Transient cerebral ischemic attack, unspecified: Secondary | ICD-10-CM | POA: Diagnosis not present

## 2016-09-30 DIAGNOSIS — I120 Hypertensive chronic kidney disease with stage 5 chronic kidney disease or end stage renal disease: Secondary | ICD-10-CM | POA: Diagnosis not present

## 2016-09-30 LAB — VAS US CAROTID
LCCAPSYS: 81 cm/s
LEFT ECA DIAS: -10 cm/s
LEFT VERTEBRAL DIAS: -5 cm/s
LICADSYS: -93 cm/s
Left CCA dist dias: -12 cm/s
Left CCA dist sys: -60 cm/s
Left CCA prox dias: 20 cm/s
Left ICA dist dias: -25 cm/s
Left ICA prox dias: -15 cm/s
Left ICA prox sys: -78 cm/s
RCCADSYS: -46 cm/s
RCCAPDIAS: -10 cm/s
RIGHT ECA DIAS: 9 cm/s
RIGHT VERTEBRAL DIAS: -6 cm/s
Right CCA prox sys: -82 cm/s

## 2016-09-30 LAB — ECHOCARDIOGRAM COMPLETE
AOPV: 0.76 m/s
AV Area VTI index: 0.95 cm2/m2
AV Area VTI: 1.52 cm2
AV VEL mean LVOT/AV: 0.82
AV pk vel: 180 cm/s
AVA: 1.81 cm2
AVAREAMEANV: 1.65 cm2
AVAREAMEANVIN: 0.86 cm2/m2
AVG: 7 mmHg
AVPG: 13 mmHg
CHL CUP AV PEAK INDEX: 0.8
CHL CUP AV VEL: 1.81
CHL CUP DOP CALC LVOT VTI: 27.3 cm
CHL CUP RV SYS PRESS: 43 mmHg
CHL CUP TV REG PEAK VELOCITY: 263 cm/s
DOP CAL AO MEAN VELOCITY: 114 cm/s
E decel time: 232 msec
E/e' ratio: 7.41
FS: 32 % (ref 28–44)
Height: 66 in
IV/PV OW: 1.11
LA vol A4C: 52.7 ml
LA vol index: 29.5 mL/m2
LA vol: 56.4 mL
LADIAMINDEX: 2.14 cm/m2
LASIZE: 41 mm
LEFT ATRIUM END SYS DIAM: 41 mm
LV SIMPSON'S DISK: 66
LV TDI E'MEDIAL: 5
LV dias vol: 47 mL (ref 46–106)
LV e' LATERAL: 11.7 cm/s
LV sys vol index: 8 mL/m2
LVDIAVOLIN: 24 mL/m2
LVEEAVG: 7.41
LVEEMED: 7.41
LVOT area: 2.01 cm2
LVOT diameter: 16 mm
LVOT peak grad rest: 7 mmHg
LVOTPV: 136 cm/s
LVOTSV: 55 mL
LVOTVTI: 0.9 cm
LVSYSVOL: 16 mL (ref 14–42)
Lateral S' vel: 8.59 cm/s
MV Dec: 232
MV Peak grad: 3 mmHg
MV pk E vel: 86.7 m/s
MVPKAVEL: 67.5 m/s
PW: 9.27 mm — AB (ref 0.6–1.1)
RV TAPSE: 11.9 mm
Stroke v: 31 ml
TDI e' lateral: 11.7
TRMAXVEL: 263 cm/s
VTI: 30.4 cm
Valve area index: 0.95
Weight: 2718.4 oz

## 2016-09-30 NOTE — Progress Notes (Signed)
SLP Cancellation Note  Patient Details Name: Amiera A [name] MRN: [id number] DOB: 12-24-1955   Cancelled treatment:       Reason Eval/Treat Not Completed: SLP screened, no needs identified, will sign off. Imaging negative, pt reports she is at her cognitive-linguistic baseline.  Deneise Lever, [place], [place] Speech-Language Pathologist [phone]   Aliene Altes 09/30/2016, 6:21 PM

## 2016-09-30 NOTE — Care Management Obs Status (Signed)
[place] NOTIFICATION   Patient Details  Name: [name] Marks MRN: [id number] Date of Birth: 1955/03/07   Medicare Observation Status Notification Given:       Delrae Sawyers, RN 09/30/2016, 4:12 PM

## 2016-09-30 NOTE — Progress Notes (Signed)
[facility] Progress Note   Subjective:  Appears at baseline but did not remember seeing me on admission and told Family med resident that she has HD T,Th,S (She has HD MWF). Questionable intermittent confusion? Oriented X 3 at present, have some difficulty getting out certain words,  no facial droop. No C/O. Says she feels like she might have been "in and out" when she first arrived to hospital.   Objective Vitals:   09/29/16 0949 09/29/16 1723 09/29/16 2107 09/30/16 0500  BP: (!) 145/69 120/77 (!) 151/64 (!) 116/53  Pulse: 73 79 71 74  Resp: 18 18 18 18   Temp: 98 F (36.7 C) 98.3 F (36.8 C) 97.9 F (36.6 C) 98.1 F (36.7 C)  TempSrc: Oral Oral Oral Oral  SpO2: 100% 100% 100% 100%  Weight:   77.1 kg (169 lb 14.4 oz)   Height:       Physical Exam General: WNWD NAD Heart: W2,B7, 2/6 systolic M Lungs: CTAB A/P Abdomen: Active BS Extremities: No LE edema Dialysis Access: LIJ TDC Drsg intact.    Additional Objective Labs: Basic Metabolic Panel:  Recent Labs Lab 09/28/16 0632 09/28/16 0637 09/29/16 0531  NA 141 142 135  K 3.8 3.7 4.0  CL 105 104 99*  CO2 26  --  27  GLUCOSE 88 86 84  BUN 24* 25* 9  CREATININE 6.25* 6.20* 4.10*  CALCIUM 9.4  --  9.5  PHOS  --   --  2.7   Liver Function Tests:  Recent Labs Lab 09/28/16 0632 09/29/16 0531  AST 32  --   ALT 23  --   ALKPHOS 239*  --   BILITOT 0.6  --   PROT 6.7  --   ALBUMIN 3.3* 3.1*   No results for input(s): LIPASE, AMYLASE in the last 168 hours. CBC:  Recent Labs Lab 09/28/16 0632 09/28/16 0637  WBC 4.8  --   NEUTROABS 2.3  --   HGB 11.0* 11.6*  HCT 33.7* 34.0*  MCV 83.8  --   PLT 190  --    Blood Culture    Component Value Date/Time   SDES BLOOD LEFT ANTECUBITAL 05/16/2016 2140   SPECREQUEST IN PEDIATRIC BOTTLE 2CC 05/16/2016 2140   CULT NO GROWTH 5 DAYS 05/16/2016 2140   REPTSTATUS 05/21/2016 FINAL 05/16/2016 2140    Cardiac Enzymes: No results for input(s): CKTOTAL,  CKMB, CKMBINDEX, TROPONINI in the last 168 hours. CBG:  Recent Labs Lab 09/28/16 0630  GLUCAP 81   Iron Studies: No results for input(s): IRON, TIBC, TRANSFERRIN, FERRITIN in the last 72 hours. @lablastinr3 @ Studies/Results: No results found. Medications:  .  stroke: mapping our early stages of recovery book   Does not apply Once  . cinacalcet  60 mg Oral Q breakfast  . clopidogrel  75 mg Oral Daily  . famotidine  20 mg Oral Daily  . feeding supplement (NEPRO CARB STEADY)  237 mL Oral Daily  . heparin  5,000 Units Subcutaneous Q8H  . sevelamer carbonate  1,600 mg Oral TID WC   Dialysis: [facility] M[facility] 4h  80kg 3.0/2.25  P2  400/Auto 1.5 Hep 6000 +2500 midrun  L IJ cath -Mircera 100 mcg IV Q 2 weeks (last dose 09/26/16) -Venofer 100 mg IV X 5 doses (1/5 given last dose 09/26/16) -Calcitriol 0.25 mcg PO TIW Renvela 800 mg 2 tabs PO TIW AC Senispar 60mg  po daily   Assessment/Plan: 1.  TIA: R facial droop/dysarthria resolved. Back to baseline now. MRI negative for  acute infarct. Neurology following. Awaiting carotid dopplers and echo results, may need TEE per primary notes.   2.  ESRD -  MWF HD tomorrow on schedule. K+ 4.0 Use 3.0 K bath.  3.  Hypertension/volume  -BP controlled. Midodrine 10 mg PO BID PRN on OP med list. Per neurology note-allow permissive HTN however BP is controlled and stable. Last HD 09/28/16 Pre wt 79.7 kg Net UF 2.5 Post wt 80 kg. Under EDW here. Lower EDW 76.5 kg on DC. No edema.  4.  Anemia  - HGB 11.0. Continue Fe load.  5.  Metabolic bone disease -  CA 9.5 Phos 2.7-hold renvela. VDRA, Sensipar. 6.  Nutrition - Albumin 3.1 Renal diet/Fld restrictions, add renal vit/nepro.   Rita H. Brown NP-C 09/30/2016, 8:17 AM  [facility] [phone]  Pt seen, examined and agree w A/P as above.  [name] MD [employer] pager [phone]   09/30/2016, 12:46 PM

## 2016-09-30 NOTE — Progress Notes (Signed)
*  PRELIMINARY RESULTS* Echocardiogram 2D Echocardiogram has been performed.  [name] Marks 09/30/2016, 12:51 PM

## 2016-09-30 NOTE — Progress Notes (Signed)
STROKE TEAM PROGRESS NOTE   Attending Addendum:  10 minute episode of right facial droop with dysarthria/?aphasia.  Completely resolved.  No arm or leg weakness or numbness.  MRI is normal.  CDUS is negative.   TTE shows LVEF 55-60% with no PFO.  Bioprosthetic aortic valve is normal.  BP is extremely well controlled.  LDL is 64.  HgA1c 4.3.  No smoking.  Telemetry so far shows NSR.  She has no vascular risk factors for stroke/TIA.  I recommend doing a TEE to ensure no missed PFO and assess more accurately for any valvular vegetations or missed atrial myxoma.   If TEE is normal, then recommend implanting a cardiac loop recorder for long term surveillance of paroxysmal atrial fibrillation.  If PAF is detected, then it will change our management to anticoagulation.   For now, continue Plavix 75 mg qd.     Rogue Jury, MS, MD   HISTORY OF PRESENT ILLNESS (per record) [name] Marks is a 61 y.o. female with a history of ESRD who presents with difficulty speaking that started this AM. She last spoke normally last night and then woke up this morning feeling normal. The first time that she tried to say anything she was having significant difficulty. She then called her son and had trouble speaking at that time as well. She was brought to the ER where a right facial droop and aphasia were noted. She was out of the tpa window and has had anaphylaxis to IV contrast so she was taken for stat MRI. This was negative.    LKW: prior to bed 8/2 tpa given?: no, out of window.    SUBJECTIVE (INTERVAL HISTORY) No complaints.   OBJECTIVE Temp:  [97.8 F (36.6 C)-98.3 F (36.8 C)] 97.8 F (36.6 C) (08/05 0930) Pulse Rate:  [70-79] 70 (08/05 0930) Cardiac Rhythm: Normal sinus rhythm (08/05 0825) Resp:  [18] 18 (08/05 0930) BP: (113-151)/(53-77) 113/53 (08/05 0930) SpO2:  [100 %] 100 % (08/05 0930) Weight:  [169 lb 14.4 oz (77.1 kg)] 169 lb 14.4 oz (77.1 kg) (08/04 2107)  CBC:   Recent Labs Lab  09/28/16 0632 09/28/16 0637  WBC 4.8  --   NEUTROABS 2.3  --   HGB 11.0* 11.6*  HCT 33.7* 34.0*  MCV 83.8  --   PLT 190  --     Basic Metabolic Panel:   Recent Labs Lab 09/28/16 0632 09/28/16 0637 09/29/16 0531  NA 141 142 135  K 3.8 3.7 4.0  CL 105 104 99*  CO2 26  --  27  GLUCOSE 88 86 84  BUN 24* 25* 9  CREATININE 6.25* 6.20* 4.10*  CALCIUM 9.4  --  9.5  PHOS  --   --  2.7    Lipid Panel:     Component Value Date/Time   CHOL 147 09/29/2016 0531   TRIG 83 09/29/2016 0531   HDL 66 09/29/2016 0531   CHOLHDL 2.2 09/29/2016 0531   VLDL 17 09/29/2016 0531   LDLCALC 64 09/29/2016 0531   HgbA1c:  Lab Results  Component Value Date   HGBA1C 4.3 05/29/2016   Urine Drug Screen: No results found for: LABOPIA, COCAINSCRNUR, LABBENZ, AMPHETMU, THCU, LABBARB  Alcohol Level     Component Value Date/Time   [facility] <5 09/28/2016 8295    IMAGING  Mr [name] Head Wo Contrast 09/28/2016 No sign of acute or subacute infarction. Atrophy and moderate chronic small-vessel ischemic changes, without visible change since 05/17/2016. MR angiography does not show  any anterior circulation large or medium vessel lesion. More distal branch vessels show atherosclerotic irregularity and narrowing, more notable in the left MCA and ACA regions. The study suffers from motion degradation. The posterior circulation is quite diminutive secondary to fetal origin of the posterior cerebral arteries.     Ct Head Code Stroke Wo Contrast 09/28/2016 1. No acute intracranial infarct or other process identified  2. ASPECTS is 10.  3. Stable atrophy with chronic small vessel ischemic disease.    PHYSICAL EXAM Awake, alert, fully oriented. Face symmetrical.  No dysarthria.  Fluent. Naming, repetition, comprehension- normal. BUE and BLE 5/5. Coordination and sensory - intact. Gait- normal. No babinski.        ASSESSMENT/PLAN [name] is a 61 y.o. female with history of end-stage renal  disease, gout, and previous staph aureus bacteremia presenting with facial droop and speech difficulties. She did not receive IV t-PA due to late presentation.  Possible TIA:    Resultant  Right facial droop and slurred speech.  CT head - No acute intracranial infarct or other process identified   MRI head - No sign of acute or subacute infarction.   MRA head - no significant stenosis noted.  Carotid Doppler -  1-39% ICA plaquing. Vertebral artery flow is antegrade.   2D Echo - EF 55 -60%. No cardiac source of emboli identified.  LDL - 64  HgbA1c - 4.3  VTE prophylaxis - subcutaneous heparin Diet renal/carb modified with fluid restriction Diet-HS Snack? Nothing; Room service appropriate? Yes; Fluid consistency: Thin  aspirin 325 mg daily prior to admission, now on Plavix 75 mg qd  Patient counseled to be compliant with her antithrombotic medications  Ongoing aggressive stroke risk factor management  Therapy recommendations:  Home health physical therapy recommended. No OT follow up.  Disposition:  Pending  Hypertension  Stable  Permissive hypertension (OK if < 220/120) but gradually normalize in 5-7 days  Long-term BP goal normotensive  Hyperlipidemia  Home meds:  No lipid lowering medications prior to admission  LDL 64, goal < 70   Other Stroke Risk Factors  Advanced age  Overweight, Body mass index is 27.42 kg/m., recommend weight loss, diet and exercise as appropriate    Other Active Problems  ESRD  Mild anemia  Hospital day # 0      To contact Stroke Continuity provider, please refer to [email or web address] After hours, contact General Neurology

## 2016-09-30 NOTE — Progress Notes (Addendum)
Family Medicine Teaching Service Daily Progress Note Intern Pager: [phone]  Patient name: [name] Marks Medical record number: [id number] Date of birth: [birth year] Age: 61 y.o. Gender: female  Primary Care Provider: Tonette Bihari, MD Consultants: Neurology, Nephrology Code Status: FULL  Pt Overview and Major Events to Date:  Admitted on 8/3, MRI negative for acute intracranial infarction or bleed  Assessment and Plan:  [name] Marks is a 61 y.o. female presenting with expressive aphasia and facial droop. PMH is significant for hx TIA, hx of aortic valve replacement with bioprosthetic valve, ESRD on HD M,W,F; Obesity.   TIA: Expressive aphasia has improved but still has some issues finding words. Otherwise neuro exam is unremarkable. CT head negative for acute infarct. MRI brain without acute infarct. MRA head.  ECHO completed in 04/2016 with normal EF and dilated right ventricle.  Cholesterol 147 Triglycerides 83 HDL 66 LDL 64.  Carotid dopplers show 1-39% ICA plaquing, mild diffuse mixed plaque in CCA, ICA, and ECA.  OT says no follow-up necessary, PT suggested home health.   - neurology following, appreciate recommendations - neuro checks per protocol  - ASA 325mg  daily - SLP - Echo on Monday, will get TEE if TTE is unrevealing d/t patient's bioprosthetic valve per neuro recs - hemoglobin A1c pending  ESRD: HD M,W,F. Electrolytes stable on admit blood work.  - nephrology consulted - continue home Renvela and Sensipar    FEN/GI: renal/carb modified, pepcid Prophylaxis: heparin SQ  Disposition: continue on telemetry  Subjective:  Patient says she feels well and that her speech continues to be normal.  She has no complaints but is ready to be out of the hospital.  I explained to her the need for an echo to see if there were clots that could embolize to her brain, and that it would performed tomorrow.  Patient expressed understanding.  Objective: Temp:  [97.9 F (36.6  C)-98.3 F (36.8 C)] 98.1 F (36.7 C) (08/05 0500) Pulse Rate:  [71-79] 74 (08/05 0500) Resp:  [18] 18 (08/05 0500) BP: (116-151)/(53-77) 116/53 (08/05 0500) SpO2:  [100 %] 100 % (08/05 0500) Weight:  [169 lb 14.4 oz (77.1 kg)] 169 lb 14.4 oz (77.1 kg) (08/04 2107) Physical Exam: General: elderly lady talking on the phone in bed with no aphasia, no facial asymmetry Cardiovascular: RRR, no MRG Respiratory: CTAB Abdomen: +bowel sounds, nontender to palpation Extremities: no edema  Laboratory:  Recent Labs Lab 09/28/16 0632 09/28/16 0637  WBC 4.8  --   HGB 11.0* 11.6*  HCT 33.7* 34.0*  PLT 190  --     Recent Labs Lab 09/28/16 0632 09/28/16 0637 09/29/16 0531  NA 141 142 135  K 3.8 3.7 4.0  CL 105 104 99*  CO2 26  --  27  BUN 24* 25* 9  CREATININE 6.25* 6.20* 4.10*  CALCIUM 9.4  --  9.5  PROT 6.7  --   --   BILITOT 0.6  --   --   ALKPHOS 239*  --   --   ALT 23  --   --   AST 32  --   --   GLUCOSE 88 86 84     Imaging/Diagnostic Tests: Mr [facility] Contrast  Result Date: 09/28/2016 CLINICAL DATA:  Acute presentation or speech disturbance and right-sided weakness. EXAM: MRI HEAD WITHOUT CONTRAST MRA HEAD WITHOUT CONTRAST TECHNIQUE: Multiplanar, multiecho pulse sequences of the brain and surrounding structures were obtained without intravenous contrast. Angiographic images of the head were obtained  using MRA technique without contrast. COMPARISON:  CT same day.  MRI 05/17/2016. FINDINGS: MRI HEAD FINDINGS Brain: Diffusion imaging does not show any acute or subacute infarction. The brainstem is normal. There is cerebellar atrophy. Cerebral hemispheres show atrophy with moderate chronic small-vessel ischemic changes affecting the deep and subcortical white matter. No visible change since the study of March. No mass lesion, hemorrhage, hydrocephalus or extra-axial collection. Vascular: Major vessels at the base of the brain show flow. Skull and upper cervical spine:  Negative Sinuses/Orbits: Clear/normal Other: None significant MRA HEAD FINDINGS Motion degraded study compared to the previous exam. Both internal carotid arteries are widely patent through the skullbase and siphon regions. The anterior and middle cerebral vessels are patent without proximal stenosis, aneurysm or vascular malformation. More distal branch vessels show atherosclerotic irregularity, more notable in the left anterior cerebral and middle cerebral branches. Fetal origin posterior cerebral arteries bilaterally. Posterior circulation is very diminutive. Small vertebral arteries. Left vertebral artery terminates in PICA. Right vertebral artery supplies a small basilar. Limited detail because of the motion degradation. IMPRESSION: No sign of acute or subacute infarction. Atrophy and moderate chronic small-vessel ischemic changes, without visible change since 05/17/2016. MR angiography does not show any anterior circulation large or medium vessel lesion. More distal branch vessels show atherosclerotic irregularity and narrowing, more notable in the left MCA and ACA regions. The study suffers from motion degradation. The posterior circulation is quite diminutive secondary to fetal origin of the posterior cerebral arteries. Electronically Signed   By: Nelson Chimes M.D.   On: 09/28/2016 08:10   Mr Brain Wo Contrast  Result Date: 09/28/2016 CLINICAL DATA:  Acute presentation or speech disturbance and right-sided weakness. EXAM: MRI HEAD WITHOUT CONTRAST MRA HEAD WITHOUT CONTRAST TECHNIQUE: Multiplanar, multiecho pulse sequences of the brain and surrounding structures were obtained without intravenous contrast. Angiographic images of the head were obtained using MRA technique without contrast. COMPARISON:  CT same day.  MRI 05/17/2016. FINDINGS: MRI HEAD FINDINGS Brain: Diffusion imaging does not show any acute or subacute infarction. The brainstem is normal. There is cerebellar atrophy. Cerebral hemispheres  show atrophy with moderate chronic small-vessel ischemic changes affecting the deep and subcortical white matter. No visible change since the study of March. No mass lesion, hemorrhage, hydrocephalus or extra-axial collection. Vascular: Major vessels at the base of the brain show flow. Skull and upper cervical spine: Negative Sinuses/Orbits: Clear/normal Other: None significant MRA HEAD FINDINGS Motion degraded study compared to the previous exam. Both internal carotid arteries are widely patent through the skullbase and siphon regions. The anterior and middle cerebral vessels are patent without proximal stenosis, aneurysm or vascular malformation. More distal branch vessels show atherosclerotic irregularity, more notable in the left anterior cerebral and middle cerebral branches. Fetal origin posterior cerebral arteries bilaterally. Posterior circulation is very diminutive. Small vertebral arteries. Left vertebral artery terminates in PICA. Right vertebral artery supplies a small basilar. Limited detail because of the motion degradation. IMPRESSION: No sign of acute or subacute infarction. Atrophy and moderate chronic small-vessel ischemic changes, without visible change since 05/17/2016. MR angiography does not show any anterior circulation large or medium vessel lesion. More distal branch vessels show atherosclerotic irregularity and narrowing, more notable in the left MCA and ACA regions. The study suffers from motion degradation. The posterior circulation is quite diminutive secondary to fetal origin of the posterior cerebral arteries. Electronically Signed   By: Nelson Chimes M.D.   On: 09/28/2016 08:10   Ct Head Code Stroke Wo Contrast  Result Date: 09/28/2016 CLINICAL DATA:  Code stroke. Initial evaluation for acute speech difficulty, right-sided weakness. EXAM: CT HEAD WITHOUT CONTRAST TECHNIQUE: Contiguous axial images were obtained from the base of the skull through the vertex without intravenous  contrast. COMPARISON:  Prior CT from 05/16/2016 as well as previous MRI from 05/17/2016. FINDINGS: Brain: Stable atrophy with moderate chronic small vessel ischemic disease. No acute intracranial hemorrhage. No evidence for acute large vessel territory infarct. No mass lesion, midline shift or mass effect. Ventricular prominence related to global parenchymal volume loss of hydrocephalus. No extra-axial fluid collection. Vascular: No hyperdense vessel. Scattered vascular calcifications noted within the carotid siphons. Skull: Scalp soft tissues and calvarium within normal limits. Sinuses/Orbits: Globes and orbital soft tissues within normal limits. Paranasal sinuses are clear. No mastoid effusion. ASPECTS [facility] Stroke Program Early CT Score) - Ganglionic level infarction (caudate, lentiform nuclei, internal capsule, insula, M1-M3 cortex): 7 - Supraganglionic infarction (M4-M6 cortex): 3 Total score (0-10 with 10 being normal): 10 IMPRESSION: 1. No acute intracranial infarct or other process identified 2. ASPECTS is 10. 3. Stable atrophy with chronic small vessel ischemic disease. Critical Value/emergent results were called by telephone at the time of interpretation on 09/28/2016 at 6:52 am to Dr. Leonel Ramsay, who verbally acknowledged these results. Electronically Signed   By: Jeannine Boga M.D.   On: 09/28/2016 06:53    Kathrene Alu, MD 09/30/2016, 9:02 AM PGY-1, [place] Intern pager: [phone], text pages welcome

## 2016-09-30 NOTE — Care Management Obs Status (Signed)
[facility] NOTIFICATION   Patient Details  Name: [name] Marks MRN: [id number] Date of Birth: 1955/10/10   Medicare Observation Status Notification Given:  Yes    CrutchfieldAntony Haste, RN 09/30/2016, 4:12 PM

## 2016-10-01 ENCOUNTER — Other Ambulatory Visit (HOSPITAL_COMMUNITY): Payer: Medicare Other

## 2016-10-01 ENCOUNTER — Observation Stay (HOSPITAL_COMMUNITY): Payer: Medicare Other

## 2016-10-01 ENCOUNTER — Encounter (HOSPITAL_COMMUNITY): Payer: Self-pay | Admitting: *Deleted

## 2016-10-01 ENCOUNTER — Encounter (HOSPITAL_COMMUNITY)
Admission: EM | Disposition: A | Discharge status: Home Health | Payer: Self-pay | Source: Home / Self Care | Attending: Family Medicine

## 2016-10-01 DIAGNOSIS — I272 Pulmonary hypertension, unspecified: Secondary | ICD-10-CM | POA: Diagnosis present

## 2016-10-01 DIAGNOSIS — G459 Transient cerebral ischemic attack, unspecified: Secondary | ICD-10-CM | POA: Diagnosis not present

## 2016-10-01 DIAGNOSIS — Z7982 Long term (current) use of aspirin: Secondary | ICD-10-CM | POA: Diagnosis not present

## 2016-10-01 DIAGNOSIS — N186 End stage renal disease: Secondary | ICD-10-CM | POA: Diagnosis not present

## 2016-10-01 DIAGNOSIS — Z841 Family history of disorders of kidney and ureter: Secondary | ICD-10-CM | POA: Diagnosis not present

## 2016-10-01 DIAGNOSIS — R29810 Facial weakness: Secondary | ICD-10-CM | POA: Diagnosis present

## 2016-10-01 DIAGNOSIS — I340 Nonrheumatic mitral (valve) insufficiency: Secondary | ICD-10-CM | POA: Diagnosis not present

## 2016-10-01 DIAGNOSIS — Z91041 Radiographic dye allergy status: Secondary | ICD-10-CM | POA: Diagnosis not present

## 2016-10-01 DIAGNOSIS — D631 Anemia in chronic kidney disease: Secondary | ICD-10-CM | POA: Diagnosis present

## 2016-10-01 DIAGNOSIS — K219 Gastro-esophageal reflux disease without esophagitis: Secondary | ICD-10-CM | POA: Diagnosis present

## 2016-10-01 DIAGNOSIS — Q211 Atrial septal defect: Secondary | ICD-10-CM | POA: Diagnosis not present

## 2016-10-01 DIAGNOSIS — M898X9 Other specified disorders of bone, unspecified site: Secondary | ICD-10-CM | POA: Diagnosis present

## 2016-10-01 DIAGNOSIS — Z952 Presence of prosthetic heart valve: Secondary | ICD-10-CM | POA: Diagnosis not present

## 2016-10-01 DIAGNOSIS — R29704 NIHSS score 4: Secondary | ICD-10-CM | POA: Diagnosis present

## 2016-10-01 DIAGNOSIS — Z8249 Family history of ischemic heart disease and other diseases of the circulatory system: Secondary | ICD-10-CM | POA: Diagnosis not present

## 2016-10-01 DIAGNOSIS — M109 Gout, unspecified: Secondary | ICD-10-CM | POA: Diagnosis present

## 2016-10-01 DIAGNOSIS — Z953 Presence of xenogenic heart valve: Secondary | ICD-10-CM | POA: Diagnosis not present

## 2016-10-01 DIAGNOSIS — Z886 Allergy status to analgesic agent status: Secondary | ICD-10-CM | POA: Diagnosis not present

## 2016-10-01 DIAGNOSIS — I120 Hypertensive chronic kidney disease with stage 5 chronic kidney disease or end stage renal disease: Secondary | ICD-10-CM | POA: Diagnosis present

## 2016-10-01 DIAGNOSIS — Z803 Family history of malignant neoplasm of breast: Secondary | ICD-10-CM | POA: Diagnosis not present

## 2016-10-01 DIAGNOSIS — Z811 Family history of alcohol abuse and dependence: Secondary | ICD-10-CM | POA: Diagnosis not present

## 2016-10-01 DIAGNOSIS — Z992 Dependence on renal dialysis: Secondary | ICD-10-CM | POA: Diagnosis not present

## 2016-10-01 DIAGNOSIS — Z833 Family history of diabetes mellitus: Secondary | ICD-10-CM | POA: Diagnosis not present

## 2016-10-01 DIAGNOSIS — I639 Cerebral infarction, unspecified: Secondary | ICD-10-CM | POA: Diagnosis not present

## 2016-10-01 DIAGNOSIS — Z8673 Personal history of transient ischemic attack (TIA), and cerebral infarction without residual deficits: Secondary | ICD-10-CM | POA: Diagnosis not present

## 2016-10-01 DIAGNOSIS — R4701 Aphasia: Secondary | ICD-10-CM | POA: Diagnosis not present

## 2016-10-01 DIAGNOSIS — N2581 Secondary hyperparathyroidism of renal origin: Secondary | ICD-10-CM | POA: Diagnosis present

## 2016-10-01 HISTORY — PX: LOOP RECORDER INSERTION: EP1214

## 2016-10-01 HISTORY — PX: TEE WITHOUT CARDIOVERSION: SHX5443

## 2016-10-01 LAB — CBC
HEMATOCRIT: 32.7 % — AB (ref 36.0–46.0)
HEMOGLOBIN: 10.5 g/dL — AB (ref 12.0–15.0)
MCH: 26.1 pg (ref 26.0–34.0)
MCHC: 32.1 g/dL (ref 30.0–36.0)
MCV: 81.3 fL (ref 78.0–100.0)
Platelets: 230 10*3/uL (ref 150–400)
RBC: 4.02 MIL/uL (ref 3.87–5.11)
RDW: 16.1 % — ABNORMAL HIGH (ref 11.5–15.5)
WBC: 5.1 10*3/uL (ref 4.0–10.5)

## 2016-10-01 LAB — RENAL FUNCTION PANEL
ALBUMIN: 2.9 g/dL — AB (ref 3.5–5.0)
ANION GAP: 9 (ref 5–15)
BUN: 16 mg/dL (ref 6–20)
CHLORIDE: 100 mmol/L — AB (ref 101–111)
CO2: 27 mmol/L (ref 22–32)
Calcium: 9.2 mg/dL (ref 8.9–10.3)
Creatinine, Ser: 7.71 mg/dL — ABNORMAL HIGH (ref 0.44–1.00)
GFR, EST AFRICAN AMERICAN: 6 mL/min — AB (ref >60)
GFR, EST NON AFRICAN AMERICAN: 5 mL/min — AB (ref >60)
Glucose, Bld: 85 mg/dL (ref 65–99)
PHOSPHORUS: 3 mg/dL (ref 2.5–4.6)
POTASSIUM: 4.3 mmol/L (ref 3.5–5.1)
Sodium: 136 mmol/L (ref 135–145)

## 2016-10-01 LAB — HEMOGLOBIN A1C
HEMOGLOBIN A1C: 4.8 % (ref 4.8–5.6)
Mean Plasma Glucose: 91 mg/dL

## 2016-10-01 SURGERY — LOOP RECORDER INSERTION

## 2016-10-01 SURGERY — ECHOCARDIOGRAM, TRANSESOPHAGEAL
Anesthesia: Moderate Sedation

## 2016-10-01 MED ORDER — SODIUM CHLORIDE 0.9 % IV SOLN: 100.0000 mL | INTRAVENOUS | Status: DC | PRN

## 2016-10-01 MED ORDER — MIDAZOLAM HCL 10 MG/2ML IJ SOLN
INTRAMUSCULAR | Status: DC | PRN
  Administered 2016-10-01: 1 mg via INTRAVENOUS
  Administered 2016-10-01: 2 mg via INTRAVENOUS
  Administered 2016-10-01: 1 mg via INTRAVENOUS

## 2016-10-01 MED ORDER — ALTEPLASE 2 MG IJ SOLR: 2.0000 mg | Freq: Once | INTRAMUSCULAR | Status: DC | PRN

## 2016-10-01 MED ORDER — LIDOCAINE HCL (PF) 1 % IJ SOLN
5.0000 mL | INTRAMUSCULAR | Status: DC | PRN
Start: 2016-10-01 — End: 2016-10-01

## 2016-10-01 MED ORDER — PENTAFLUOROPROP-TETRAFLUOROETH EX AERO: 1.0000 "application " | INHALATION_SPRAY | CUTANEOUS | Status: DC | PRN

## 2016-10-01 MED ORDER — FENTANYL CITRATE (PF) 100 MCG/2ML IJ SOLN
INTRAMUSCULAR | Status: DC | PRN
  Administered 2016-10-01: 25 ug via INTRAVENOUS
  Administered 2016-10-01: 12.5 ug via INTRAVENOUS

## 2016-10-01 MED ORDER — HEPARIN SODIUM (PORCINE) 1000 UNIT/ML DIALYSIS: 40.0000 [IU]/kg | INTRAMUSCULAR | Status: DC | PRN

## 2016-10-01 MED ORDER — LIDOCAINE-EPINEPHRINE 1 %-1:100000 IJ SOLN
INTRAMUSCULAR | Status: DC | PRN
  Administered 2016-10-01: 20 mL

## 2016-10-01 MED ORDER — HEPARIN SODIUM (PORCINE) 1000 UNIT/ML DIALYSIS: 1000.0000 [IU] | INTRAMUSCULAR | Status: DC | PRN

## 2016-10-01 MED ORDER — SODIUM CHLORIDE 0.9 % IV SOLN: INTRAVENOUS | Status: DC

## 2016-10-01 MED ORDER — LIDOCAINE-PRILOCAINE 2.5-2.5 % EX CREA: 1.0000 "application " | TOPICAL_CREAM | CUTANEOUS | Status: DC | PRN

## 2016-10-01 MED ORDER — LIDOCAINE HCL (PF) 1 % IJ SOLN: 5.0000 mL | INTRAMUSCULAR | Status: DC | PRN

## 2016-10-01 MED ORDER — MIDODRINE HCL 5 MG PO TABS
ORAL_TABLET | ORAL | Status: AC
  Filled 2016-10-01: qty 2

## 2016-10-01 MED ORDER — MIDAZOLAM HCL 5 MG/ML IJ SOLN
INTRAMUSCULAR | Status: AC
  Filled 2016-10-01: qty 2

## 2016-10-01 MED ORDER — LIDOCAINE-EPINEPHRINE 1 %-1:100000 IJ SOLN
INTRAMUSCULAR | Status: AC
  Filled 2016-10-01: qty 1

## 2016-10-01 MED ORDER — HEPARIN SODIUM (PORCINE) 1000 UNIT/ML DIALYSIS
1000.0000 [IU] | INTRAMUSCULAR | Status: DC | PRN
Start: 2016-10-01 — End: 2016-10-01

## 2016-10-01 MED ORDER — LIDOCAINE VISCOUS 2 % MT SOLN
OROMUCOSAL | Status: DC | PRN
  Administered 2016-10-01: 10 mL via OROMUCOSAL

## 2016-10-01 MED ORDER — HEPARIN SODIUM (PORCINE) 1000 UNIT/ML DIALYSIS
6000.0000 [IU] | Freq: Once | INTRAMUSCULAR | Status: AC
  Administered 2016-10-01: 6000 [IU] via INTRAVENOUS_CENTRAL

## 2016-10-01 MED ORDER — FENTANYL CITRATE (PF) 100 MCG/2ML IJ SOLN
INTRAMUSCULAR | Status: AC
  Filled 2016-10-01: qty 2

## 2016-10-01 MED ORDER — LIDOCAINE VISCOUS 2 % MT SOLN
OROMUCOSAL | Status: AC
  Filled 2016-10-01: qty 15

## 2016-10-01 SURGICAL SUPPLY — 2 items
LOOP REVEAL LINQSYS (Prosthesis & Implant Heart) ×2 IMPLANT
PACK LOOP INSERTION (CUSTOM PROCEDURE TRAY) ×2 IMPLANT

## 2016-10-01 NOTE — Procedures (Signed)
  I was present at this dialysis session, have reviewed the session itself and made  appropriate changes [name] MD [place] pager [phone]   10/01/2016, 12:55 PM

## 2016-10-01 NOTE — Progress Notes (Signed)
  Echocardiogram Echocardiogram Transesophageal has been performed.  [name] Marks 10/01/2016, 12:54 PM

## 2016-10-01 NOTE — Progress Notes (Signed)
[facility] Progress Note  Subjective: on HD no c/o  Vitals:   10/01/16 1230 10/01/16 1235 10/01/16 1240 10/01/16 1245  BP: (!) 199/99 (!) 187/90 (!) 177/129 (!) 135/53  Pulse: (!) 108 (!) 101 (!) 106 80  Resp: 20 (!) 22 19 18   Temp:      TempSrc:      SpO2: 100% 100% 100% 100%  Weight:      Height:        Inpatient medications: . [MAR Hold] cinacalcet  60 mg Oral Q breakfast  . [MAR Hold] clopidogrel  75 mg Oral Daily  . [MAR Hold] famotidine  20 mg Oral Daily  . [MAR Hold] feeding supplement (NEPRO CARB STEADY)  237 mL Oral Daily  . [MAR Hold] heparin  5,000 Units Subcutaneous Q8H    fentaNYL, lidocaine, midazolam, [MAR Hold] midodrine, [MAR Hold] senna-docusate, [MAR Hold] witch hazel-glycerin  Exam: General: WNWD NAD Heart: C9,S4, 2/6 systolic M Lungs: CTAB A/P Abdomen: Active BS Extremities: No LE edema Dialysis Access: LIJ TDC Drsg intact.    Dialysis: [facility] M[facility] 4h 80kg 3.0/2.25 P2 400/Auto 1.5 Hep 6000 +2500 midrun L IJ cath -Mircera 100 mcg IV Q 2 weeks (last dose 09/26/16) -Venofer 100 mg IV X 5 doses (1/5 given last dose 09/26/16) -Calcitriol 0.25 mcg PO TIW  Renvela 800 mg 2 tabs PO TIW AC Senispar 60mg  po daily      Impression: 1  TIA - aphasia, improved.  W/U in progress, for TEE today. TTE showed normal EF.  Is also to get loop recorder to r/o PAF.  2  ESRD on HD MWF. HD today.  3  HTN stable, not on meds. BP's on high side, will dc midodrine for now (on home dose of 10 mg bid) 4  MBD cont meds 5  Anemia of CKD - cont Fe load. Hb up.     Plan - as above   [name] MD [facility] pager [phone]   10/01/2016, 12:49 PM    Recent Labs Lab 09/28/16 [phone] 09/28/16 0637 09/29/16 0531 10/01/16 0445  NA 141 142 135 136  K 3.8 3.7 4.0 4.3  CL 105 104 99* 100*  CO2 26  --  27 27  GLUCOSE 88 86 84 85  BUN 24* 25* 9 16  CREATININE 6.25* 6.20* 4.10* 7.71*  CALCIUM 9.4  --  9.5 9.2  PHOS  --   --  2.7  3.0    Recent Labs Lab 09/28/16 0632 09/29/16 0531 10/01/16 0445  AST 32  --   --   ALT 23  --   --   ALKPHOS 239*  --   --   BILITOT 0.6  --   --   PROT 6.7  --   --   ALBUMIN 3.3* 3.1* 2.9*    Recent Labs Lab 09/28/16 0632 09/28/16 0637 10/01/16 0445  WBC 4.8  --  5.1  NEUTROABS 2.3  --   --   HGB 11.0* 11.6* 10.5*  HCT 33.7* 34.0* 32.7*  MCV 83.8  --  81.3  PLT 190  --  230   Iron/TIBC/Ferritin/ %Sat    Component Value Date/Time   IRON 16 (L) 02/16/2016 1213   TIBC 99 (L) 02/16/2016 1213   FERRITIN 942 (H) 02/16/2016 1213   IRONPCTSAT 16 02/16/2016 1213

## 2016-10-01 NOTE — Plan of Care (Signed)
Problem: Education: Goal: Knowledge of disease and its progression will improve Outcome: Completed/Met Date Met: 10/01/16 Not new for HD.

## 2016-10-01 NOTE — CV Procedure (Signed)
TRANSESOPHAGEAL ECHOCARDIOGRAM (TEE) NOTE  INDICATIONS: cryptogenic stroke  PROCEDURE:   Informed consent was obtained prior to the procedure. The risks, benefits and alternatives for the procedure were discussed and the patient comprehended these risks.  Risks include, but are not limited to, cough, sore throat, vomiting, nausea, somnolence, esophageal and stomach trauma or perforation, bleeding, low blood pressure, aspiration, pneumonia, infection, trauma to the teeth and death.    After a procedural time-out, the patient was given 4 mg versed and 37.5 mcg fentanyl for moderate sedation.  The patient's heart rate, blood pressure, and oxygen saturation are monitored continuously during the procedure.The oropharynx was anesthetized 10 cc of topical 1% viscous lidocaine and 1 cetacaine spray.  The transesophageal probe was inserted in the esophagus and stomach without difficulty and multiple views were obtained.  The patient was kept under observation until the patient left the procedure room.  The period of conscious sedation is 18 minutes, of which I was present face-to-face 100% of this time. The patient left the procedure room in stable condition.   Agitated microbubble saline contrast was administered.  COMPLICATIONS:    There were no immediate complications.  Findings:  1. LEFT VENTRICLE: The left ventricular wall thickness is mildly increased.  The left ventricular cavity is normal in size. Wall motion is normal.  LVEF is 60-65%.  2. RIGHT VENTRICLE:  The right ventricle is normal in structure and function without any thrombus or masses.    3. LEFT ATRIUM:  The left atrium is mildly dialted in size without any thrombus or masses.  There is not spontaneous echo contrast ("smoke") in the left atrium consistent with a low flow state.  4. LEFT ATRIAL APPENDAGE:  The left atrial appendage is free of any thrombus or masses. The appendage has single lobes. Pulse doppler indicates high  flow in the appendage.  5. ATRIAL SEPTUM:  The atrial septum is not aneurysmal and is free of thrombus and/or masses. There are a few early saline microbubbles seen in the left atrium suggestive of very small PFO. No interatrial shunting was noted by color doppler.  6. RIGHT ATRIUM:  The right atrium is normal in size and function without any thrombus or masses. There is a dialysis catheter noted in the RA without thrombus.  7. MITRAL VALVE:  The mitral valve is normal in structure and function with Mild regurgitation.  There were no vegetations or stenosis.  8. AORTIC VALVE:  The aortic valve is trileaflet, normal in structure and function with no regurgitation.  There were no vegetations or stenosis  9. TRICUSPID VALVE:  The tricuspid valve is normal in structure and function with Moderate regurgitation. RVSP 43 mmHg + RAP. There were no vegetations or stenosis  10.  PULMONIC VALVE:  The pulmonic valve is normal in structure and function with no regurgitation.  There were no vegetations or stenosis.   11. AORTIC ARCH, ASCENDING AND DESCENDING AORTA:  There was grade 1 Ron Parker et. Al, 1992) atherosclerosis of the ascending aorta, aortic arch, or proximal descending aorta.  12. PULMONARY VEINS: Anomalous pulmonary venous return was not noted.  13. PERICARDIUM: The pericardium appeared normal and non-thickened.  There is no pericardial effusion.  IMPRESSION:   1. No atrial septal aneurysm. Very small PFO with a few early saline microbubbles seen in the left atrium. No obvious right to left or left to right shunting by color doppler at the atrial level. 2. No LAA thrombus 3. Right atrial dialysis catheter noted without thrombus  4. Mild atheromatous disease of the aortic arch noted 5. Moderate TR - RVSP 43 mmHg + RAP. 6. LVEF 60-65%  RECOMMENDATIONS:    1. Very small PFO with a few saline microbubbles noted to cross into the LA. Consider LE venous dopplers.  2. Otherwise, no obvious source  of stroke. Dialysis catheter well-visualized without thrombus. Mild aortic atheromatous disease. 3. Reasonable to consider loop recorder implant.  Time Spent Directly with the Patient:  45 minutes   Pixie Casino, MD, [place]  Attending Cardiologist  Direct Dial: [phone]  Fax: [phone]  Website:  [email or web address] 10/01/2016, 12:48 PM

## 2016-10-01 NOTE — Progress Notes (Signed)
STROKE TEAM PROGRESS NOTE      HISTORY OF PRESENT ILLNESS (per record) Alisandra A [name] is a 61 y.o. female with a history of ESRD who presents with difficulty speaking that started this AM. She last spoke normally last night and then woke up this morning feeling normal. The first time that she tried to say anything she was having significant difficulty. She then called her son and had trouble speaking at that time as well. She was brought to the ER where a right facial droop and aphasia were noted. She was out of the tpa window and has had anaphylaxis to IV contrast so she was taken for stat MRI. This was negative.    LKW: prior to bed 8/2 tpa given?: no, out of window.    SUBJECTIVE (INTERVAL HISTORY) No complaints.   OBJECTIVE Temp:  [98 F (36.7 C)-98.5 F (36.9 C)] 98.4 F (36.9 C) (08/06 1250) Pulse Rate:  [65-108] 81 (08/06 1330) Cardiac Rhythm: Normal sinus rhythm (08/06 1130) Resp:  [12-22] 15 (08/06 1330) BP: (105-199)/(46-129) 119/46 (08/06 1330) SpO2:  [97 %-100 %] 99 % (08/06 1330) Weight:  [167 lb 15.9 oz (76.2 kg)-169 lb (76.7 kg)] 167 lb 15.9 oz (76.2 kg) (08/06 1119)  CBC:   Recent Labs Lab 09/28/16 0632 09/28/16 0637 10/01/16 0445  WBC 4.8  --  5.1  NEUTROABS 2.3  --   --   HGB 11.0* 11.6* 10.5*  HCT 33.7* 34.0* 32.7*  MCV 83.8  --  81.3  PLT 190  --  440    Basic Metabolic Panel:   Recent Labs Lab 09/29/16 0531 10/01/16 0445  NA 135 136  K 4.0 4.3  CL 99* 100*  CO2 27 27  GLUCOSE 84 85  BUN 9 16  CREATININE 4.10* 7.71*  CALCIUM 9.5 9.2  PHOS 2.7 3.0    Lipid Panel:     Component Value Date/Time   CHOL 147 09/29/2016 0531   TRIG 83 09/29/2016 0531   HDL 66 09/29/2016 0531   CHOLHDL 2.2 09/29/2016 0531   VLDL 17 09/29/2016 0531   LDLCALC 64 09/29/2016 0531   HgbA1c:  Lab Results  Component Value Date   HGBA1C 4.8 09/29/2016   Urine Drug Screen: No results found for: LABOPIA, COCAINSCRNUR, LABBENZ, AMPHETMU, THCU, LABBARB   Alcohol Level     Component Value Date/Time   [facility] <5 09/28/2016 3474    IMAGING  Mr [name] Head Wo Contrast 09/28/2016 No sign of acute or subacute infarction. Atrophy and moderate chronic small-vessel ischemic changes, without visible change since 05/17/2016. MR angiography does not show any anterior circulation large or medium vessel lesion. More distal branch vessels show atherosclerotic irregularity and narrowing, more notable in the left MCA and ACA regions. The study suffers from motion degradation. The posterior circulation is quite diminutive secondary to fetal origin of the posterior cerebral arteries.     Ct Head Code Stroke Wo Contrast 09/28/2016 1. No acute intracranial infarct or other process identified  2. ASPECTS is 10.  3. Stable atrophy with chronic small vessel ischemic disease.    PHYSICAL EXAM  Pleasant middle-aged lady currently not in distress.  . Afebrile. Head is nontraumatic. Neck is supple without bruit.    Cardiac exam no murmur or gallop. Lungs are clear to auscultation. Distal pulses are well felt.  Neurological Exam ;  Awake  Alert oriented x 3. Normal speech and language.eye movements full without nystagmus.fundi were not visualized. Vision acuity and fields appear normal. Hearing is  normal. Palatal movements are normal. Face symmetric. Tongue midline. Normal strength, tone, reflexes and coordination. Normal sensation. Gait deferred. en.   ASSESSMENT/PLAN Ms. Joyleen A [name] is a 61 y.o. female with history of end-stage renal disease, gout, and previous staph aureus bacteremia presenting with facial droop and speech difficulties. She did not receive IV t-PA due to late presentation.  Possible TIA:    Resultant  Right facial droop and slurred speech resolved.  CT head - No acute intracranial infarct or other process identified   MRI head - No sign of acute or subacute infarction.   MRA head - no significant stenosis noted.  Carotid Doppler -  1-39%  ICA plaquing. Vertebral artery flow is antegrade.   2D Echo - EF 55 -60%. No cardiac source of emboli identified.  LDL - 64  HgbA1c - 4.3  VTE prophylaxis - subcutaneous heparin Diet renal/carb modified with fluid restriction Diet-HS Snack? Nothing; Room service appropriate? Yes; Fluid consistency: Thin  aspirin 325 mg daily prior to admission, now on Plavix 75 mg qd  Patient counseled to be compliant with her antithrombotic medications  Ongoing aggressive stroke risk factor management  Therapy recommendations:  Home health physical therapy recommended. No OT follow up.  Disposition:  Pending  Hypertension  Stable  Permissive hypertension (OK if < 220/120) but gradually normalize in 5-7 days  Long-term BP goal normotensive  Hyperlipidemia  Home meds:  No lipid lowering medications prior to admission  LDL 64, goal < 70   Other Stroke Risk Factors  Advanced age  Overweight, Body mass index is 27.11 kg/m., recommend weight loss, diet and exercise as appropriate    Other Active Problems  ESRD  Mild anemia  Hospital day # 0  I have personally examined this patient, reviewed notes, independently viewed imaging studies, participated in medical decision making and plan of care.ROS completed by me personally and pertinent positives fully documented  I have made any additions or clarifications directly to the above note. She presented with the transient slurred speech and right facial droop due to a TIA likely from small vessel disease. Agree with changing aspirin to Plavix and aggressive risk factor modification. Patient counseled to be compliant with her medications. Follow-up in the stroke clinic in 6 weeks or call earlier if necessary. Stroke team will sign off. Discussed with patient. kindly call for questions.  Antony Contras, MD Medical Director [place] Pager: [phone] 10/01/2016 2:27 PM     To contact Stroke Continuity provider, please refer  to [email or web address] After hours, contact General Neurology

## 2016-10-01 NOTE — Progress Notes (Signed)
Family Medicine Teaching Service Daily Progress Note Intern Pager: [phone]  Patient name: [name] Marks Medical record number: [id number] Date of birth: Feb 05, 1956 Age: 61 y.o. Gender: female  Primary Care Provider: Tonette Bihari, MD Consultants: Neurology, Nephrology Code Status: FULL  Pt Overview and Major Events to Date:  Admitted on 8/3, MRI negative for acute intracranial infarction or bleed Carotid Dopplers performed on 8/4, no intervention necessary TTE on 8/5 normal  Assessment and Plan:  [name] Marks is a 61 y.o. female presenting with expressive aphasia and facial droop. PMH is significant for hx TIA, hx of aortic valve replacement with bioprosthetic valve, ESRD on HD M,W,F; Obesity.   TIA: Expressive aphasia has improved but still has some issues finding words. Otherwise neuro exam is unremarkable. CT head negative for acute infarct. MRI brain without acute infarct. MRA head.  ECHO completed in 04/2016 with normal EF and dilated right ventricle.  Cholesterol 147 Triglycerides 83 HDL 66 LDL 64.  Carotid dopplers show 1-39% ICA plaquing, mild diffuse mixed plaque in CCA, ICA, and ECA.  OT says no follow-up necessary, PT suggested home health.  SLP has signed off.  Echo on 8/5 negative for blood clot. - neurology following, appreciate recommendations - neuro checks per protocol  - ASA 325mg  daily - TEE on 8/6, if TEE is unrevealing, neuro recommends implanting cardiac loop recorder for surveillance for PAF since this will change anticoagulation needs.  Echo Network engineer says this will happen on Tuesday, but EP MD scheduled it for today, so will be NPO just in case it can occur today. - hemoglobin A1c pending  ESRD: HD M,W,F. Electrolytes stable on admit blood work.  - nephrology consulted - continue home Renvela and Sensipar  -not taking Megase at home - will d/c due to prothrombotic effects   FEN/GI: renal/carb modified, pepcid Prophylaxis: heparin  SQ  Disposition: continue on telemetry  Subjective:  Patient says she feels well and that her speech continues to be normal.  She has no complaints but is ready to be out of the hospital.  She understands that a TEE then a loop recorder will likely be placed either today or tomorrow.  She knows that this is being done to examine her heart as a potential source of blood clots.  Objective: Temp:  [97.8 F (36.6 C)-98.5 F (36.9 C)] 98 F (36.7 C) (08/06 0601) Pulse Rate:  [70-78] 74 (08/06 0601) Resp:  [18] 18 (08/06 0601) BP: (113-132)/(53-69) 129/69 (08/06 0601) SpO2:  [97 %-100 %] 97 % (08/06 0601) Weight:  [169 lb (76.7 kg)] 169 lb (76.7 kg) (08/05 2030) Physical Exam: General: elderly lady in dialysis, in NAD Cardiovascular: RRR, no MRG Respiratory: CTAB Abdomen: +bowel sounds, nontender to palpation Extremities: no edema  Laboratory:  Recent Labs Lab 09/28/16 0632 09/28/16 0637 10/01/16 0445  WBC 4.8  --  5.1  HGB 11.0* 11.6* 10.5*  HCT 33.7* 34.0* 32.7*  PLT 190  --  230    Recent Labs Lab 09/28/16 0632 09/28/16 0637 09/29/16 0531 10/01/16 0445  NA 141 142 135 136  K 3.8 3.7 4.0 4.3  CL 105 104 99* 100*  CO2 26  --  27 27  BUN 24* 25* 9 16  CREATININE 6.25* 6.20* 4.10* 7.71*  CALCIUM 9.4  --  9.5 9.2  PROT 6.7  --   --   --   BILITOT 0.6  --   --   --   ALKPHOS 239*  --   --   --  ALT 23  --   --   --   AST 32  --   --   --   GLUCOSE 88 86 84 85     Imaging/Diagnostic Tests: Mr Virgel Paling Wo Contrast  Result Date: 09/28/2016 CLINICAL DATA:  Acute presentation or speech disturbance and right-sided weakness. EXAM: MRI HEAD WITHOUT CONTRAST MRA HEAD WITHOUT CONTRAST TECHNIQUE: Multiplanar, multiecho pulse sequences of the brain and surrounding structures were obtained without intravenous contrast. Angiographic images of the head were obtained using MRA technique without contrast. COMPARISON:  CT same day.  MRI 05/17/2016. FINDINGS: MRI HEAD FINDINGS  Brain: Diffusion imaging does not show any acute or subacute infarction. The brainstem is normal. There is cerebellar atrophy. Cerebral hemispheres show atrophy with moderate chronic small-vessel ischemic changes affecting the deep and subcortical white matter. No visible change since the study of March. No mass lesion, hemorrhage, hydrocephalus or extra-axial collection. Vascular: Major vessels at the base of the brain show flow. Skull and upper cervical spine: Negative Sinuses/Orbits: Clear/normal Other: None significant MRA HEAD FINDINGS Motion degraded study compared to the previous exam. Both internal carotid arteries are widely patent through the skullbase and siphon regions. The anterior and middle cerebral vessels are patent without proximal stenosis, aneurysm or vascular malformation. More distal branch vessels show atherosclerotic irregularity, more notable in the left anterior cerebral and middle cerebral branches. Fetal origin posterior cerebral arteries bilaterally. Posterior circulation is very diminutive. Small vertebral arteries. Left vertebral artery terminates in PICA. Right vertebral artery supplies a small basilar. Limited detail because of the motion degradation. IMPRESSION: No sign of acute or subacute infarction. Atrophy and moderate chronic small-vessel ischemic changes, without visible change since 05/17/2016. MR angiography does not show any anterior circulation large or medium vessel lesion. More distal branch vessels show atherosclerotic irregularity and narrowing, more notable in the left MCA and ACA regions. The study suffers from motion degradation. The posterior circulation is quite diminutive secondary to fetal origin of the posterior cerebral arteries. Electronically Signed   By: Nelson Chimes M.D.   On: 09/28/2016 08:10   Mr Brain Wo Contrast  Result Date: 09/28/2016 CLINICAL DATA:  Acute presentation or speech disturbance and right-sided weakness. EXAM: MRI HEAD WITHOUT CONTRAST  MRA HEAD WITHOUT CONTRAST TECHNIQUE: Multiplanar, multiecho pulse sequences of the brain and surrounding structures were obtained without intravenous contrast. Angiographic images of the head were obtained using MRA technique without contrast. COMPARISON:  CT same day.  MRI 05/17/2016. FINDINGS: MRI HEAD FINDINGS Brain: Diffusion imaging does not show any acute or subacute infarction. The brainstem is normal. There is cerebellar atrophy. Cerebral hemispheres show atrophy with moderate chronic small-vessel ischemic changes affecting the deep and subcortical white matter. No visible change since the study of March. No mass lesion, hemorrhage, hydrocephalus or extra-axial collection. Vascular: Major vessels at the base of the brain show flow. Skull and upper cervical spine: Negative Sinuses/Orbits: Clear/normal Other: None significant MRA HEAD FINDINGS Motion degraded study compared to the previous exam. Both internal carotid arteries are widely patent through the skullbase and siphon regions. The anterior and middle cerebral vessels are patent without proximal stenosis, aneurysm or vascular malformation. More distal branch vessels show atherosclerotic irregularity, more notable in the left anterior cerebral and middle cerebral branches. Fetal origin posterior cerebral arteries bilaterally. Posterior circulation is very diminutive. Small vertebral arteries. Left vertebral artery terminates in PICA. Right vertebral artery supplies a small basilar. Limited detail because of the motion degradation. IMPRESSION: No sign of acute or subacute infarction.  Atrophy and moderate chronic small-vessel ischemic changes, without visible change since 05/17/2016. MR angiography does not show any anterior circulation large or medium vessel lesion. More distal branch vessels show atherosclerotic irregularity and narrowing, more notable in the left MCA and ACA regions. The study suffers from motion degradation. The posterior circulation  is quite diminutive secondary to fetal origin of the posterior cerebral arteries. Electronically Signed   By: Nelson Chimes M.D.   On: 09/28/2016 08:10   Ct Head Code Stroke Wo Contrast  Result Date: 09/28/2016 CLINICAL DATA:  Code stroke. Initial evaluation for acute speech difficulty, right-sided weakness. EXAM: CT HEAD WITHOUT CONTRAST TECHNIQUE: Contiguous axial images were obtained from the base of the skull through the vertex without intravenous contrast. COMPARISON:  Prior CT from 05/16/2016 as well as previous MRI from 05/17/2016. FINDINGS: Brain: Stable atrophy with moderate chronic small vessel ischemic disease. No acute intracranial hemorrhage. No evidence for acute large vessel territory infarct. No mass lesion, midline shift or mass effect. Ventricular prominence related to global parenchymal volume loss of hydrocephalus. No extra-axial fluid collection. Vascular: No hyperdense vessel. Scattered vascular calcifications noted within the carotid siphons. Skull: Scalp soft tissues and calvarium within normal limits. Sinuses/Orbits: Globes and orbital soft tissues within normal limits. Paranasal sinuses are clear. No mastoid effusion. ASPECTS [facility] Stroke Program Early CT Score) - Ganglionic level infarction (caudate, lentiform nuclei, internal capsule, insula, M1-M3 cortex): 7 - Supraganglionic infarction (M4-M6 cortex): 3 Total score (0-10 with 10 being normal): 10 IMPRESSION: 1. No acute intracranial infarct or other process identified 2. ASPECTS is 10. 3. Stable atrophy with chronic small vessel ischemic disease. Critical Value/emergent results were called by telephone at the time of interpretation on 09/28/2016 at 6:52 am to Dr. Leonel Ramsay, who verbally acknowledged these results. Electronically Signed   By: Jeannine Boga M.D.   On: 09/28/2016 06:53   Echo 8/5 Study Conclusions  - Left ventricle: The cavity size was normal. Wall thickness was   normal. Systolic function was normal.  The estimated ejection   fraction was in the range of 55% to 60%. Wall motion was normal;   there were no regional wall motion abnormalities. Left   ventricular diastolic function parameters were normal. - Ventricular septum: Septal motion showed paradox. The contour   showed diastolic flattening. These changes are consistent with RV   volume overload. - Aortic valve: A bioprosthesis was present and functioning   normally. Valve area (VTI): 2.39 cm^2. Valve area (Vmax): 2.01   cm^2. Valve area (Vmean): 2.19 cm^2. - Mitral valve: There was mild regurgitation. - Left atrium: The atrium was mildly dilated. - Right ventricle: The cavity size was moderately dilated. - Right atrium: The atrium was moderately dilated. - Atrial septum: The septum bowed from right to left, consistent   with increased right atrial pressure. No defect or patent foramen   ovale was identified. 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 A   diagnosis of severe regurgitation is supported by a dense   continuous wave Doppler signal, a concave configuration of the   late systolic continuous wave signal, and hepatic vein systolic   flow reversal. - Pulmonary arteries: Systolic pressure was mildly increased. PA   peak pressure: 43 mm Hg (S).   Kathrene Alu, MD 10/01/2016, 6:36 AM PGY-1, [place] Intern pager: [phone], text pages welcome

## 2016-10-01 NOTE — H&P (Signed)
   INTERVAL PROCEDURE H&P  History and Physical Interval Note:  10/01/2016 11:48 AM  [name] Marks has presented today for their planned procedure. The various methods of treatment have been discussed with the patient and family. After consideration of risks, benefits and other options for treatment, the patient has consented to the procedure.  The patients' outpatient history has been reviewed, patient examined, and no change in status from most recent office note within the past 30 days. I have reviewed the patients' chart and labs and will proceed as planned. Questions were answered to the patient's satisfaction.   Pixie Casino, MD, [place]  Attending Cardiologist  Direct Dial: [phone]  Fax: [phone]  Website:  [name] 10/01/2016, 11:48 AM

## 2016-10-01 NOTE — Care Management Note (Signed)
Case Management Note  Patient Details  Name: [name] Marks MRN: [id number] Date of Birth: 1955/07/12  Subjective/Objective:    CM following for progression and d/c planning.                 Action/Plan: 10/01/16 Pt offered [facility] services and selected [facility] as she has used that [facility] agency previously. [place] notified of need for HHPT and [place].  Expected Discharge Date:    10/01/2016              Expected Discharge Plan:  [place]  In-House Referral:  NA  Discharge planning Services  CM Consult  Post Acute Care Choice:  Home Health Choice offered to:  Patient  DME Arranged:  N/A DME Agency:  NA  HH Arranged:  PT, OT HH Agency:  [facility]  Status of Service:  Completed, signed off  If discussed at [place] of Stay Meetings, dates discussed:    Additional Comments:  Adron Bene, RN 10/01/2016, 4:03 PM

## 2016-10-01 NOTE — Consult Note (Signed)
ELECTROPHYSIOLOGY CONSULT NOTE  Patient ID: [name] Marks MRN: [id number], DOB/AGE: [birth year]   Admit date: 09/28/2016 Date of Consult: 10/01/2016  Primary Physician: Tonette Bihari, MD Primary Cardiologist: [name] Marks Reason for Consultation: Cryptogenic stroke; recommendations regarding Implantable Loop Recorder  History of Present Illness: [name] Marks is a 61 y.o. female whom EP has been asked to see by Dr Orlena Sheldon was admitted on 09/28/2016 with right facial droop and aphasia. She has undergone workup for stroke including echocardiogram and carotid dopplers.  The patient has been monitored on telemetry which has demonstrated sinus rhythm with no arrhythmias.  Inpatient stroke work-up is to be completed with a TEE.   Echocardiogram this admission demonstrated EF 55-60%, no RWMA, severe TR.  Lab work is reviewed.  Prior to admission, the patient denies chest pain, shortness of breath, dizziness, palpitations, or syncope.  They are recovering from their stroke with plans to return home at discharge.  EP has been asked to evaluate for placement of an implantable loop recorder to monitor for atrial fibrillation.  Past Medical History:  Diagnosis Date  . Complication of anesthesia    due to kidney disease  . End stage renal disease on dialysis ([place])   . GERD (gastroesophageal reflux disease)   . Gout   . Septic shock ([place])   . Staphylococcus aureus bacteremia      Surgical History:  Past Surgical History:  Procedure Laterality Date  . AORTIC VALVE REPLACEMENT N/A 02/25/2016   Procedure: AORTIC VALVE REPLACEMENT (AVR) implanted with Magna Ease Aortic valve size 41mm;  Surgeon: Melrose Nakayama, MD;  Location: [place];  Service: Open Heart Surgery;  Laterality: N/A;  . [place] Right 02/17/2016   Procedure: REMOVAL OF TWO ARTERIOVENOUS GORETEX GRAFTS ([place]);  Surgeon: Angelia Mould, MD;  Location: [place];  Service: Vascular;  Laterality: Right;  . BREAST BIOPSY      stereo left 2013  . BREAST BIOPSY     stereo right 2011  . DG AV DIALYSIS GRAFT DECLOT OR    . DILATATION & CURETTAGE/HYSTEROSCOPY WITH TRUECLEAR N/A 11/06/2012   Procedure: DILATATION & CURETTAGE/HYSTEROSCOPY WITH TRUECLEAR;  Surgeon: Terrance Mass, MD;  Location: [facility];  Service: Gynecology;  Laterality: N/A;  Truclear Resectoscopic Polypectomy   . INSERTION OF DIALYSIS CATHETER N/A 02/19/2016   Procedure: INSERTION OF Left Internal Jugular DIALYSIS CATHETER;  Surgeon: Angelia Mould, MD;  Location: [place];  Service: Vascular;  Laterality: N/A;  . PATCH ANGIOPLASTY Right 02/17/2016   Procedure: PATCH ANGIOPLASTY;  Surgeon: Angelia Mould, MD;  Location: [place];  Service: Vascular;  Laterality: Right;  . PERIPHERAL VASCULAR CATHETERIZATION N/A 09/14/2014   Procedure: A/V Shuntogram/Fistulagram;  Surgeon: Katha Cabal, MD;  Location: [place] [facility];  Service: Cardiovascular;  Laterality: N/A;  . PERIPHERAL VASCULAR CATHETERIZATION N/A 09/14/2014   Procedure: A/V Shunt Intervention;  Surgeon: Katha Cabal, MD;  Location: [place] [facility];  Service: Cardiovascular;  Laterality: N/A;  . PERIPHERAL VASCULAR CATHETERIZATION Right 12/09/2014   Procedure: A/V Shuntogram/Fistulagram;  Surgeon: Algernon Huxley, MD;  Location: [place] [facility];  Service: Cardiovascular;  Laterality: Right;  . PERIPHERAL VASCULAR CATHETERIZATION N/A 12/09/2014   Procedure: A/V Shunt Intervention;  Surgeon: Algernon Huxley, MD;  Location: [place] [facility];  Service: Cardiovascular;  Laterality: N/A;  . PERIPHERAL VASCULAR CATHETERIZATION Right 05/24/2015   Procedure: A/V Shuntogram;  Surgeon: Serafina Mitchell, MD;  Location: [place] [facility];  Service: Cardiovascular;  Laterality: Right;  . PERIPHERAL VASCULAR CATHETERIZATION Right 05/24/2015   Procedure: Peripheral Vascular Balloon Angioplasty;  Surgeon: Serafina Mitchell, MD;  Location: [place] [facility];  Service: Cardiovascular;   Laterality: Right;  right arm shunt  . PERIPHERAL VASCULAR CATHETERIZATION N/A 06/13/2015   Procedure: A/V Shuntogram/Fistulagram;  Surgeon: Algernon Huxley, MD;  Location: [place] [facility];  Service: Cardiovascular;  Laterality: N/A;  . PERIPHERAL VASCULAR CATHETERIZATION N/A 06/13/2015   Procedure: A/V Shunt Intervention;  Surgeon: Algernon Huxley, MD;  Location: [place] [facility];  Service: Cardiovascular;  Laterality: N/A;  . TEE WITHOUT CARDIOVERSION N/A 02/22/2016   Procedure: TRANSESOPHAGEAL ECHOCARDIOGRAM (TEE);  Surgeon: Pixie Casino, MD;  Location: [facility];  Service: Cardiovascular;  Laterality: N/A;  . TEE WITHOUT CARDIOVERSION N/A 02/25/2016   Procedure: TRANSESOPHAGEAL ECHOCARDIOGRAM (TEE);  Surgeon: Melrose Nakayama, MD;  Location: [place];  Service: Open Heart Surgery;  Laterality: N/A;  . TUBAL LIGATION  1983     Prescriptions Prior to Admission  Medication Sig Dispense Refill Last Dose  . aspirin EC 325 MG EC tablet Take 1 tablet (325 mg total) by mouth daily. 30 tablet 0 09/27/2016 at 1800  . cinacalcet (SENSIPAR) 60 MG tablet Take 120 mg by mouth every Monday, Wednesday, and Friday with hemodialysis.    09/26/2016  . famotidine (PEPCID) 20 MG tablet Take 20 mg by mouth See admin instructions. Lunch and dinner   09/27/2016 at Unknown time  . ibuprofen (ADVIL,MOTRIN) 600 MG tablet Take 600 mg by mouth every 6 (six) hours as needed for mild pain.   09/27/2016 at Unknown time  . midodrine (PROAMATINE) 10 MG tablet Take 1 tablet (10 mg total) by mouth 2 (two) times daily as needed (30 min prior to HD). 60 tablet 0 09/27/2016 at Unknown time  . Multiple Vitamins-Minerals (PRORENAL QD PO) Take 1 tablet by mouth daily.    09/27/2016 at Unknown time  . Nutritional Supplements (NEPRO) LIQD Take 237 mLs by mouth every dialysis (Mon, Wed, Fri).    09/26/2016  . sevelamer (RENVELA) 800 MG tablet Take 800 mg by mouth 3 (three) times daily with meals.    09/27/2016 at Unknown time  . megestrol  (MEGACE) 40 MG tablet Take 1 tablet (40 mg total) by mouth daily. (Patient not taking: Reported on 09/28/2016) 30 tablet 0 Not Taking at Unknown time    Inpatient Medications:  . cinacalcet  60 mg Oral Q breakfast  . clopidogrel  75 mg Oral Daily  . famotidine  20 mg Oral Daily  . feeding supplement (NEPRO CARB STEADY)  237 mL Oral Daily  . heparin  5,000 Units Subcutaneous Q8H    Allergies:  Allergies  Allergen Reactions  . Contrast Media [Iodinated Diagnostic Agents] Anaphylaxis  . Naproxen Sodium Itching    Social History   Social History  . Marital status: Single    Spouse name: N/A  . Number of children: N/A  . Years of education: N/A   Occupational History  . Not on file.   Social History Main Topics  . Smoking status: Never Smoker  . Smokeless tobacco: Never Used  . Alcohol use No  . Drug use: No  . Sexual activity: Not Currently    Birth control/ protection: Post-menopausal     Comment: 1st intercourse 61 yo-Fewer than 5 partners   Other Topics Concern  . Not on file   Social History Narrative  . No narrative on file     Family History  Problem Relation Age of Onset  . Diabetes Mother   . Hypertension Mother   . Heart disease Mother   . Alcohol abuse Mother   . Kidney disease Mother   . Cancer Father        COLON  . Alcohol abuse Father   . Breast cancer Sister 27  . Hypertension Sister   . Kidney disease Sister   . Hypertension Son   . Kidney disease Son       Review of Systems: All other systems reviewed and are otherwise negative except as noted above.  Physical Exam: Vitals:   09/30/16 0930 09/30/16 1727 09/30/16 2030 10/01/16 0601  BP: (!) 113/53 (!) 132/55 (!) 117/57 129/69  Pulse: 70 71 78 74  Resp: 18 18 18 18   Temp: 97.8 F (36.6 C) 98.5 F (36.9 C) 98.4 F (36.9 C) 98 F (36.7 C)  TempSrc: Oral Oral Oral Oral  SpO2: 100% 100% 97% 97%  Weight:   169 lb (76.7 kg)   Height:        GEN- The patient is well appearing, alert  and oriented x 3 today.   Head- normocephalic, atraumatic Eyes-  Sclera clear, conjunctiva pink Ears- hearing intact Oropharynx- clear Neck- supple Lungs- Clear to ausculation bilaterally, normal work of breathing Heart- Regular rate and rhythm  GI- soft, NT, ND, + BS Extremities- no clubbing, cyanosis, or edema MS- no significant deformity or atrophy Skin- no rash or lesion Psych- euthymic mood, full affect   Labs:   Lab Results  Component Value Date   WBC 5.1 10/01/2016   HGB 10.5 (L) 10/01/2016   HCT 32.7 (L) 10/01/2016   MCV 81.3 10/01/2016   PLT 230 10/01/2016    Recent Labs Lab 09/28/16 0632  10/01/16 0445  NA 141  < > 136  K 3.8  < > 4.3  CL 105  < > 100*  CO2 26  < > 27  BUN 24*  < > 16  CREATININE 6.25*  < > 7.71*  CALCIUM 9.4  < > 9.2  PROT 6.7  --   --   BILITOT 0.6  --   --   ALKPHOS 239*  --   --   ALT 23  --   --   AST 32  --   --   GLUCOSE 88  < > 85  < > = values in this interval not displayed. No results found for: CKTOTAL, CKMB, CKMBINDEX, TROPONINI  Radiology/Studies: Mr Virgel Paling ST Contrast  Result Date: 09/28/2016 CLINICAL DATA:  Acute presentation or speech disturbance and right-sided weakness. EXAM: MRI HEAD WITHOUT CONTRAST MRA HEAD WITHOUT CONTRAST TECHNIQUE: Multiplanar, multiecho pulse sequences of the brain and surrounding structures were obtained without intravenous contrast. Angiographic images of the head were obtained using MRA technique without contrast. COMPARISON:  CT same day.  MRI 05/17/2016. FINDINGS: MRI HEAD FINDINGS Brain: Diffusion imaging does not show any acute or subacute infarction. The brainstem is normal. There is cerebellar atrophy. Cerebral hemispheres show atrophy with moderate chronic small-vessel ischemic changes affecting the deep and subcortical white matter. No visible change since the study of March. No mass lesion, hemorrhage, hydrocephalus or extra-axial collection. Vascular: Major vessels at the base of the  brain show flow. Skull and upper cervical spine: Negative Sinuses/Orbits: Clear/normal Other: None significant MRA HEAD FINDINGS Motion degraded study compared to the previous exam. Both internal carotid arteries are widely patent through the skullbase and siphon regions. The anterior and middle cerebral vessels  are patent without proximal stenosis, aneurysm or vascular malformation. More distal branch vessels show atherosclerotic irregularity, more notable in the left anterior cerebral and middle cerebral branches. Fetal origin posterior cerebral arteries bilaterally. Posterior circulation is very diminutive. Small vertebral arteries. Left vertebral artery terminates in PICA. Right vertebral artery supplies a small basilar. Limited detail because of the motion degradation. IMPRESSION: No sign of acute or subacute infarction. Atrophy and moderate chronic small-vessel ischemic changes, without visible change since 05/17/2016. MR angiography does not show any anterior circulation large or medium vessel lesion. More distal branch vessels show atherosclerotic irregularity and narrowing, more notable in the left MCA and ACA regions. The study suffers from motion degradation. The posterior circulation is quite diminutive secondary to fetal origin of the posterior cerebral arteries. Electronically Signed   By: Nelson Chimes M.D.   On: 09/28/2016 08:10   Mr Brain Wo Contrast  Result Date: 09/28/2016 CLINICAL DATA:  Acute presentation or speech disturbance and right-sided weakness. EXAM: MRI HEAD WITHOUT CONTRAST MRA HEAD WITHOUT CONTRAST TECHNIQUE: Multiplanar, multiecho pulse sequences of the brain and surrounding structures were obtained without intravenous contrast. Angiographic images of the head were obtained using MRA technique without contrast. COMPARISON:  CT same day.  MRI 05/17/2016. FINDINGS: MRI HEAD FINDINGS Brain: Diffusion imaging does not show any acute or subacute infarction. The brainstem is normal.  There is cerebellar atrophy. Cerebral hemispheres show atrophy with moderate chronic small-vessel ischemic changes affecting the deep and subcortical white matter. No visible change since the study of March. No mass lesion, hemorrhage, hydrocephalus or extra-axial collection. Vascular: Major vessels at the base of the brain show flow. Skull and upper cervical spine: Negative Sinuses/Orbits: Clear/normal Other: None significant MRA HEAD FINDINGS Motion degraded study compared to the previous exam. Both internal carotid arteries are widely patent through the skullbase and siphon regions. The anterior and middle cerebral vessels are patent without proximal stenosis, aneurysm or vascular malformation. More distal branch vessels show atherosclerotic irregularity, more notable in the left anterior cerebral and middle cerebral branches. Fetal origin posterior cerebral arteries bilaterally. Posterior circulation is very diminutive. Small vertebral arteries. Left vertebral artery terminates in PICA. Right vertebral artery supplies a small basilar. Limited detail because of the motion degradation. IMPRESSION: No sign of acute or subacute infarction. Atrophy and moderate chronic small-vessel ischemic changes, without visible change since 05/17/2016. MR angiography does not show any anterior circulation large or medium vessel lesion. More distal branch vessels show atherosclerotic irregularity and narrowing, more notable in the left MCA and ACA regions. The study suffers from motion degradation. The posterior circulation is quite diminutive secondary to fetal origin of the posterior cerebral arteries. Electronically Signed   By: Nelson Chimes M.D.   On: 09/28/2016 08:10   Ct Head Code Stroke Wo Contrast  Result Date: 09/28/2016 CLINICAL DATA:  Code stroke. Initial evaluation for acute speech difficulty, right-sided weakness. EXAM: CT HEAD WITHOUT CONTRAST TECHNIQUE: Contiguous axial images were obtained from the base of the  skull through the vertex without intravenous contrast. COMPARISON:  Prior CT from 05/16/2016 as well as previous MRI from 05/17/2016. FINDINGS: Brain: Stable atrophy with moderate chronic small vessel ischemic disease. No acute intracranial hemorrhage. No evidence for acute large vessel territory infarct. No mass lesion, midline shift or mass effect. Ventricular prominence related to global parenchymal volume loss of hydrocephalus. No extra-axial fluid collection. Vascular: No hyperdense vessel. Scattered vascular calcifications noted within the carotid siphons. Skull: Scalp soft tissues and calvarium within normal limits. Sinuses/Orbits: Globes and  orbital soft tissues within normal limits. Paranasal sinuses are clear. No mastoid effusion. ASPECTS [facility] Stroke Program Early CT Score) - Ganglionic level infarction (caudate, lentiform nuclei, internal capsule, insula, M1-M3 cortex): 7 - Supraganglionic infarction (M4-M6 cortex): 3 Total score (0-10 with 10 being normal): 10 IMPRESSION: 1. No acute intracranial infarct or other process identified 2. ASPECTS is 10. 3. Stable atrophy with chronic small vessel ischemic disease. Critical Value/emergent results were called by telephone at the time of interpretation on 09/28/2016 at 6:52 am to Dr. Leonel Ramsay, who verbally acknowledged these results. Electronically Signed   By: Jeannine Boga M.D.   On: 09/28/2016 06:53    12-lead ECG sinus rhythm All prior EKG's in EPIC reviewed with no documented atrial fibrillation  Telemetry sinus rhythm  Assessment and Plan:  1. Cryptogenic TIA The patient presents with cryptogenic TIA.  The patient has a TEE planned for this AM.  I spoke at length with the patient about monitoring for afib with an implantable loop recorder.  Risks, benefits, and alteratives to implantable loop recorder were discussed with the patient today.   At this time, the patient is very clear in their decision to proceed with implantable loop  recorder.   Risks, benefits to TEE also reviewed. 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  Wound care was reviewed with the patient (keep incision clean and dry for 3 days).   Please call with questions.   Chanetta Marshall, NP 10/01/2016 6:55 AM  I have seen and examined this patient with Chanetta Marshall.  Agree with above, note added to reflect my findings.  On exam, RRR, no murmurs, lungs clear. Had TIA without cause found. Plan for TEE today. If negative, will plan for LINQ implant. Risks and benefits discussed. Risks include but not limited to bleeding and infection. The patient understands the risks and has agreed to the procedure.    Will M. Camnitz MD 10/01/2016 8:19 AM

## 2016-10-02 ENCOUNTER — Encounter (HOSPITAL_COMMUNITY): Payer: Self-pay | Admitting: Cardiology

## 2016-10-02 ENCOUNTER — Inpatient Hospital Stay (HOSPITAL_COMMUNITY): Payer: Medicare Other

## 2016-10-02 DIAGNOSIS — G459 Transient cerebral ischemic attack, unspecified: Secondary | ICD-10-CM

## 2016-10-02 LAB — CBC
HCT: 34.8 % — ABNORMAL LOW (ref 36.0–46.0)
Hemoglobin: 11.3 g/dL — ABNORMAL LOW (ref 12.0–15.0)
MCH: 26.8 pg (ref 26.0–34.0)
MCHC: 32.5 g/dL (ref 30.0–36.0)
MCV: 82.7 fL (ref 78.0–100.0)
PLATELETS: 256 10*3/uL (ref 150–400)
RBC: 4.21 MIL/uL (ref 3.87–5.11)
RDW: 16.9 % — AB (ref 11.5–15.5)
WBC: 4.8 10*3/uL (ref 4.0–10.5)

## 2016-10-02 LAB — RENAL FUNCTION PANEL
Albumin: 3.1 g/dL — ABNORMAL LOW (ref 3.5–5.0)
Anion gap: 9 (ref 5–15)
BUN: 7 mg/dL (ref 6–20)
CHLORIDE: 98 mmol/L — AB (ref 101–111)
CO2: 28 mmol/L (ref 22–32)
CREATININE: 4.56 mg/dL — AB (ref 0.44–1.00)
Calcium: 8.9 mg/dL (ref 8.9–10.3)
GFR calc Af Amer: 11 mL/min — ABNORMAL LOW (ref >60)
GFR calc non Af Amer: 9 mL/min — ABNORMAL LOW (ref >60)
GLUCOSE: 93 mg/dL (ref 65–99)
Phosphorus: 2.7 mg/dL (ref 2.5–4.6)
Potassium: 4.1 mmol/L (ref 3.5–5.1)
Sodium: 135 mmol/L (ref 135–145)

## 2016-10-02 LAB — ECHO TEE
Reg peak vel: 327 cm/s
TR max vel: 327 cm/s

## 2016-10-02 MED ORDER — CLOPIDOGREL BISULFATE 75 MG PO TABS: 75.0000 mg | ORAL_TABLET | Freq: Every day | ORAL | 0 refills | Status: DC

## 2016-10-02 NOTE — Progress Notes (Signed)
Patient Discharge: Disposition: Patient discharged to home. Education: Reviewed medications, prescriptions, follow-up appointments and discharge instructions, understood and acknowledged. IV: Discontinued IV before discharge. Telemetry: Discontinued Tele before discharge, CCMD notified. Transportation: Patient escorted out of the unit in w/c till ride. Belongings: Patient took all her belongings with her.

## 2016-10-02 NOTE — Progress Notes (Signed)
*  PRELIMINARY RESULTS* Vascular Ultrasound Bilateral lower extremity venous duplex has been completed.  Preliminary findings: No evidence of deep vein thrombosis the visualized veins of the lower extremities.  Bilateral baker's cysts, right larger than left.  Everrett Coombe 10/02/2016, 4:29 PM

## 2016-10-02 NOTE — Progress Notes (Addendum)
Family Medicine Teaching Service Daily Progress Note Intern Pager: [phone]  Patient name: [name] Marks Medical record number: [id number] Date of birth: 1955-03-25 Age: 61 y.o. Gender: female  Primary Care Provider: Tonette Bihari, MD Consultants: Neurology, Nephrology Code Status: FULL  Pt Overview and Major Events to Date:  Admitted on 8/3, MRI negative for acute intracranial infarction or bleed Carotid Dopplers performed on 8/4, no intervention necessary TTE on 8/5 normal  Assessment and Plan:  [name] Marks is a 61 y.o. female presenting with expressive aphasia and facial droop. PMH is significant for hx TIA, hx of aortic valve replacement with bioprosthetic valve, ESRD on HD M,W,F; Obesity.   TIA: Expressive aphasia has improved but still has some issues finding words. Otherwise neuro exam is unremarkable. CT head negative for acute infarct. MRI brain without acute infarct. MRA head.  ECHO completed in 04/2016 with normal EF and dilated right ventricle.  Cholesterol 147 Triglycerides 83 HDL 66 LDL 64.  Carotid dopplers show 1-39% ICA plaquing, mild diffuse mixed plaque in CCA, ICA, and ECA.  OT says no follow-up necessary, PT suggested home health.  SLP has signed off.  Echo on 8/5 negative for blood clot. TEE positive for small PFO, so needs LE venous dopplers on 8/7.  Received loop recorder on 8/6.  Hemoglobin A1c 4.3 - neurology has signed off - needs follow-up with stroke clinic in next 6 weeks - neuro checks per protocol  - Plavix 75 mg daily - BLE venous doppler today, then D/C home   ESRD: HD M,W,F. Electrolytes stable on admit blood work.  - nephrology consulted - continue home Renvela and Sensipar     FEN/GI: renal/carb modified, pepcid Prophylaxis: heparin SQ  Disposition: home today  Subjective:  Patient says she feels well and that her speech continues to be normal.  She has no complaints but is ready to be out of the hospital.  She understands that  she will be receiving a doppler study of her legs today.  Objective: Temp:  [97.5 F (36.4 C)-98.5 F (36.9 C)] 98.5 F (36.9 C) (08/07 0450) Pulse Rate:  [66-108] 79 (08/07 0450) Resp:  [13-22] 20 (08/07 0450) BP: (105-199)/(46-129) 137/68 (08/07 0450) SpO2:  [95 %-100 %] 100 % (08/07 0450) Weight:  [167 lb 15.9 oz (76.2 kg)-168 lb 3.4 oz (76.3 kg)] 168 lb 3.4 oz (76.3 kg) (08/06 2139) Physical Exam: General: middle-aged lady sitting in chair eating breakfast, in NAD Cardiovascular: RRR, no MRG Respiratory: CTAB Abdomen: +bowel sounds, nontender to palpation Extremities: no edema  Laboratory:  Recent Labs Lab 09/28/16 0632 09/28/16 0637 10/01/16 0445 10/02/16 0523  WBC 4.8  --  5.1 4.8  HGB 11.0* 11.6* 10.5* 11.3*  HCT 33.7* 34.0* 32.7* 34.8*  PLT 190  --  230 256    Recent Labs Lab 09/28/16 0632  09/29/16 0531 10/01/16 0445 10/02/16 0523  NA 141  < > 135 136 135  K 3.8  < > 4.0 4.3 4.1  CL 105  < > 99* 100* 98*  CO2 26  --  27 27 28   BUN 24*  < > 9 16 7   CREATININE 6.25*  < > 4.10* 7.71* 4.56*  CALCIUM 9.4  --  9.5 9.2 8.9  PROT 6.7  --   --   --   --   BILITOT 0.6  --   --   --   --   ALKPHOS 239*  --   --   --   --  ALT 23  --   --   --   --   AST 32  --   --   --   --   GLUCOSE 88  < > 84 85 93  < > = values in this interval not displayed.   Imaging/Diagnostic Tests: Mr Virgel Paling YK Contrast  Result Date: 09/28/2016 CLINICAL DATA:  Acute presentation or speech disturbance and right-sided weakness. EXAM: MRI HEAD WITHOUT CONTRAST MRA HEAD WITHOUT CONTRAST TECHNIQUE: Multiplanar, multiecho pulse sequences of the brain and surrounding structures were obtained without intravenous contrast. Angiographic images of the head were obtained using MRA technique without contrast. COMPARISON:  CT same day.  MRI 05/17/2016. FINDINGS: MRI HEAD FINDINGS Brain: Diffusion imaging does not show any acute or subacute infarction. The brainstem is normal. There is cerebellar  atrophy. Cerebral hemispheres show atrophy with moderate chronic small-vessel ischemic changes affecting the deep and subcortical white matter. No visible change since the study of March. No mass lesion, hemorrhage, hydrocephalus or extra-axial collection. Vascular: Major vessels at the base of the brain show flow. Skull and upper cervical spine: Negative Sinuses/Orbits: Clear/normal Other: None significant MRA HEAD FINDINGS Motion degraded study compared to the previous exam. Both internal carotid arteries are widely patent through the skullbase and siphon regions. The anterior and middle cerebral vessels are patent without proximal stenosis, aneurysm or vascular malformation. More distal branch vessels show atherosclerotic irregularity, more notable in the left anterior cerebral and middle cerebral branches. Fetal origin posterior cerebral arteries bilaterally. Posterior circulation is very diminutive. Small vertebral arteries. Left vertebral artery terminates in PICA. Right vertebral artery supplies a small basilar. Limited detail because of the motion degradation. IMPRESSION: No sign of acute or subacute infarction. Atrophy and moderate chronic small-vessel ischemic changes, without visible change since 05/17/2016. MR angiography does not show any anterior circulation large or medium vessel lesion. More distal branch vessels show atherosclerotic irregularity and narrowing, more notable in the left MCA and ACA regions. The study suffers from motion degradation. The posterior circulation is quite diminutive secondary to fetal origin of the posterior cerebral arteries. Electronically Signed   By: Nelson Chimes M.D.   On: 09/28/2016 08:10   Mr Brain Wo Contrast  Result Date: 09/28/2016 CLINICAL DATA:  Acute presentation or speech disturbance and right-sided weakness. EXAM: MRI HEAD WITHOUT CONTRAST MRA HEAD WITHOUT CONTRAST TECHNIQUE: Multiplanar, multiecho pulse sequences of the brain and surrounding structures  were obtained without intravenous contrast. Angiographic images of the head were obtained using MRA technique without contrast. COMPARISON:  CT same day.  MRI 05/17/2016. FINDINGS: MRI HEAD FINDINGS Brain: Diffusion imaging does not show any acute or subacute infarction. The brainstem is normal. There is cerebellar atrophy. Cerebral hemispheres show atrophy with moderate chronic small-vessel ischemic changes affecting the deep and subcortical white matter. No visible change since the study of March. No mass lesion, hemorrhage, hydrocephalus or extra-axial collection. Vascular: Major vessels at the base of the brain show flow. Skull and upper cervical spine: Negative Sinuses/Orbits: Clear/normal Other: None significant MRA HEAD FINDINGS Motion degraded study compared to the previous exam. Both internal carotid arteries are widely patent through the skullbase and siphon regions. The anterior and middle cerebral vessels are patent without proximal stenosis, aneurysm or vascular malformation. More distal branch vessels show atherosclerotic irregularity, more notable in the left anterior cerebral and middle cerebral branches. Fetal origin posterior cerebral arteries bilaterally. Posterior circulation is very diminutive. Small vertebral arteries. Left vertebral artery terminates in PICA. Right vertebral artery supplies a  small basilar. Limited detail because of the motion degradation. IMPRESSION: No sign of acute or subacute infarction. Atrophy and moderate chronic small-vessel ischemic changes, without visible change since 05/17/2016. MR angiography does not show any anterior circulation large or medium vessel lesion. More distal branch vessels show atherosclerotic irregularity and narrowing, more notable in the left MCA and ACA regions. The study suffers from motion degradation. The posterior circulation is quite diminutive secondary to fetal origin of the posterior cerebral arteries. Electronically Signed   By: Nelson Chimes M.D.   On: 09/28/2016 08:10   Ct Head Code Stroke Wo Contrast  Result Date: 09/28/2016 CLINICAL DATA:  Code stroke. Initial evaluation for acute speech difficulty, right-sided weakness. EXAM: CT HEAD WITHOUT CONTRAST TECHNIQUE: Contiguous axial images were obtained from the base of the skull through the vertex without intravenous contrast. COMPARISON:  Prior CT from 05/16/2016 as well as previous MRI from 05/17/2016. FINDINGS: Brain: Stable atrophy with moderate chronic small vessel ischemic disease. No acute intracranial hemorrhage. No evidence for acute large vessel territory infarct. No mass lesion, midline shift or mass effect. Ventricular prominence related to global parenchymal volume loss of hydrocephalus. No extra-axial fluid collection. Vascular: No hyperdense vessel. Scattered vascular calcifications noted within the carotid siphons. Skull: Scalp soft tissues and calvarium within normal limits. Sinuses/Orbits: Globes and orbital soft tissues within normal limits. Paranasal sinuses are clear. No mastoid effusion. ASPECTS [facility] Stroke Program Early CT Score) - Ganglionic level infarction (caudate, lentiform nuclei, internal capsule, insula, M1-M3 cortex): 7 - Supraganglionic infarction (M4-M6 cortex): 3 Total score (0-10 with 10 being normal): 10 IMPRESSION: 1. No acute intracranial infarct or other process identified 2. ASPECTS is 10. 3. Stable atrophy with chronic small vessel ischemic disease. Critical Value/emergent results were called by telephone at the time of interpretation on 09/28/2016 at 6:52 am to Dr. Leonel Ramsay, who verbally acknowledged these results. Electronically Signed   By: Jeannine Boga M.D.   On: 09/28/2016 06:53   Echo 8/5 Study Conclusions  - Left ventricle: The cavity size was normal. Wall thickness was   normal. Systolic function was normal. The estimated ejection   fraction was in the range of 55% to 60%. Wall motion was normal;   there were no regional  wall motion abnormalities. Left   ventricular diastolic function parameters were normal. - Ventricular septum: Septal motion showed paradox. The contour   showed diastolic flattening. These changes are consistent with RV   volume overload. - Aortic valve: A bioprosthesis was present and functioning   normally. Valve area (VTI): 2.39 cm^2. Valve area (Vmax): 2.01   cm^2. Valve area (Vmean): 2.19 cm^2. - Mitral valve: There was mild regurgitation. - Left atrium: The atrium was mildly dilated. - Right ventricle: The cavity size was moderately dilated. - Right atrium: The atrium was moderately dilated. - Atrial septum: The septum bowed from right to left, consistent   with increased right atrial pressure. No defect or patent foramen   ovale was identified. 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 A   diagnosis of severe regurgitation is supported by a dense   continuous wave Doppler signal, a concave configuration of the   late systolic continuous wave signal, and hepatic vein systolic   flow reversal. - Pulmonary arteries: Systolic pressure was mildly increased. PA   peak pressure: 43 mm Hg (S).  TEE 8/6  IMPRESSION:   1. No atrial septal aneurysm. Very small PFO with a few early  saline microbubbles seen in the left atrium. No obvious right to left or left to right shunting by color doppler at the atrial level. 2. No LAA thrombus 3. Right atrial dialysis catheter noted without thrombus 4. Mild atheromatous disease of the aortic arch noted 5. Moderate TR - RVSP 43 mmHg + RAP. 6. LVEF 60-65%    Kathrene Alu, MD 10/02/2016, 7:30 AM PGY-1, [place] Intern pager: [phone], text pages welcome

## 2016-10-02 NOTE — Discharge Instructions (Signed)
You were admitted to the hospital due to a TIA. You were seen by neurology while you were here. There are no signs of stroke. You will need to follow up with neurology in the outpatient setting. Physical therapy recommended home treatments so these have been ordered for you. It was a pleasure caring for you. Take care!

## 2016-10-02 NOTE — Progress Notes (Signed)
[facility] Progress Note  Subjective: no c/o, thinks may be going home  Vitals:   10/01/16 1838 10/01/16 2139 10/02/16 0450 10/02/16 0849  BP: (!) 122/55 (!) 123/50 137/68 140/65  Pulse: 78 84 79 82  Resp: 16 19 20 20   Temp: (!) 97.5 F (36.4 C) 98.3 F (36.8 C) 98.5 F (36.9 C) 98.1 F (36.7 C)  TempSrc: Oral Oral Oral Oral  SpO2: 99% 95% 100% 100%  Weight:  76.3 kg (168 lb 3.4 oz)    Height:        Inpatient medications: . cinacalcet  60 mg Oral Q breakfast  . clopidogrel  75 mg Oral Daily  . famotidine  20 mg Oral Daily  . feeding supplement (NEPRO CARB STEADY)  237 mL Oral Daily  . heparin  5,000 Units Subcutaneous Q8H   . sodium chloride    . sodium chloride    . sodium chloride     sodium chloride, sodium chloride, alteplase, heparin, heparin, lidocaine (PF), lidocaine-prilocaine, pentafluoroprop-tetrafluoroeth, senna-docusate, witch hazel-glycerin  Exam: General: WNWD NAD Heart: E9,F8, 2/6 systolic M Lungs: CTAB A/P Abdomen: Active BS Extremities: No LE edema Dialysis Access: LIJ TDC Drsg intact.    [facility]: [facility] M[facility] 4h 80kg 3.0/2.25 P2 400/Auto 1.5 Hep 6000 +2500 midrun L IJ cath -Mircera 100 mcg IV Q 2 weeks (last dose 09/26/16) -Venofer 100 mg IV X 5 doses (1/5 given last dose 09/26/16) -Calcitriol 0.25 mcg PO TIW  Renvela 800 mg 2 tabs PO TIW AC Senispar 60mg  po daily      Impression: 1  TIA - aphasia, improved.  TEE negative except for small PFO.  Loop recorder placed. Getting LE dopplers today 2  ESRD on HD MWF. HD today.  3  Hypotension - on midodrine at home.  BP's on high side, have stopped midodrine for now 4  MBD cont meds 5  Anemia of CKD - cont Fe load. Hb up 6  Dispo - per primary team   Plan - as above   [name] MD [place] pager [phone]   10/02/2016, 12:53 PM    Recent Labs Lab 09/29/16 0531 10/01/16 0445 10/02/16 0523  NA 135 136 135  K 4.0 4.3 4.1  CL 99* 100* 98*   CO2 27 27 28   GLUCOSE 84 85 93  BUN 9 16 7   CREATININE 4.10* 7.71* 4.56*  CALCIUM 9.5 9.2 8.9  PHOS 2.7 3.0 2.7    Recent Labs Lab 09/28/16 0632 09/29/16 0531 10/01/16 0445 10/02/16 0523  AST 32  --   --   --   ALT 23  --   --   --   ALKPHOS 239*  --   --   --   BILITOT 0.6  --   --   --   PROT 6.7  --   --   --   ALBUMIN 3.3* 3.1* 2.9* 3.1*    Recent Labs Lab 09/28/16 5277 09/28/16 0637 10/01/16 0445 10/02/16 0523  WBC 4.8  --  5.1 4.8  NEUTROABS 2.3  --   --   --   HGB 11.0* 11.6* 10.5* 11.3*  HCT 33.7* 34.0* 32.7* 34.8*  MCV 83.8  --  81.3 82.7  PLT 190  --  230 256   Iron/TIBC/Ferritin/ %Sat    Component Value Date/Time   IRON 16 (L) 02/16/2016 1213   TIBC 99 (L) 02/16/2016 1213   FERRITIN 942 (H) 02/16/2016 1213   IRONPCTSAT 16 02/16/2016 1213

## 2016-10-02 NOTE — Progress Notes (Signed)
Called patient, she was in the hospital for TIA. Would like to go home today. No issues. Plans to follow up with me outpatient.   [name] Marks

## 2016-10-02 NOTE — Progress Notes (Signed)
Occupational Therapy Treatment Patient Details Name: [name] Marks MRN: [id number] DOB: December 20, 1955 Today's Date: 10/02/2016    History of present illness Pt is a 61 y/o female admitted secondary to aphasia. MRI was negative. PMH including but not limited to ESRD (HD M-W-F), HTN, CVA, AVR 2017.   OT comments  Pt progressing towards established OT goals. Session focused on activity tolerance and endurance while performing morning routine. Pt maintained standing at sink for ADLs for ~14 min with three short rest breaks in between. Pt motivated to participate in therapy and get stronger. Continue to recommend dc home once medically stable and will continue to follow acutely to facilitate safe dc.   Follow Up Recommendations  No OT follow up;Supervision - Intermittent    Equipment Recommendations  None recommended by OT    Recommendations for Other Services      Precautions / Restrictions Precautions Precautions: Fall Restrictions Weight Bearing Restrictions: No       Mobility Bed Mobility               General bed mobility comments: Up in recliner upon arrival  Transfers Overall transfer level: Needs assistance Equipment used: None Transfers: Sit to/from Stand Sit to Stand: Supervision         General transfer comment: increased time, supervision for safety; Vcs for hand placement during sit to stand. Pt wanting to pull up on RW. Provided demo to increase pt understanding    Balance Overall balance assessment: Needs assistance Sitting-balance support: Feet supported Sitting balance-Leahy Scale: Normal     Standing balance support: During functional activity;No upper extremity supported Standing balance-Leahy Scale: Fair                             ADL either performed or assessed with clinical judgement   ADL Overall ADL's : Needs assistance/impaired     Grooming: Supervision/safety;Oral care;Sitting;Applying deodorant;Wash/dry face Grooming  Details (indicate cue type and reason): Pt performed grooming at sink with focus on activity tolerance and endurance. Pt maintained standing for ~14 mins with three rest breaks in between. Initially standing time of 5 min.  Upper Body Bathing: Supervision/ safety;Standing Upper Body Bathing Details (indicate cue type and reason): Pt washing under her arms while standing  Lower Body Bathing: Supervison/ safety;Sit to/from stand Lower Body Bathing Details (indicate cue type and reason): Pt performed peri care with supervision and in standing Upper Body Dressing : Set up;Sitting Upper Body Dressing Details (indicate cue type and reason): doned new gown Lower Body Dressing: Supervision/safety;Sit to/from stand Lower Body Dressing Details (indicate cue type and reason): doffed/donned underwear             Functional mobility during ADLs: Min guard;Rolling walker (with and without RW) General ADL Comments: Session focusing on building endurance and activity tolernace. Pt performed grooming, sponge bath, and dressing at sink - 5 min initially. total standing time of  14 min with 3 rest breaks. Pt reporting that her legs feel weak and she has soreness in her arms. Pt peformed LE excercises at end of session     Vision       Perception     Praxis      Cognition Arousal/Alertness: Awake/alert Behavior During Therapy: WFL for tasks assessed/performed Overall Cognitive Status: Within Functional Limits for tasks assessed  Exercises Exercises: General Upper Extremity;General Lower Extremity General Exercises - Upper Extremity Chair Push Up: AROM;Both;10 reps;Seated General Exercises - Lower Extremity Straight Leg Raises: Both;15 reps;Seated;AROM Hip Flexion/Marching: AROM;Both;15 reps;Seated   Shoulder Instructions       General Comments Pt very motivated to particpate in therapy to increase endurance before dc to home.     Pertinent Vitals/ Pain       Pain Assessment: Faces Faces Pain Scale: Hurts little more Pain Location: bilateral LEs; Bil arms Pain Descriptors / Indicators: Sore Pain Intervention(s): Monitored during session  Home Living                                          Prior Functioning/Environment              Frequency  Min 2X/week        Progress Toward Goals  OT Goals(current goals can now be found in the care plan section)  Progress towards OT goals: Progressing toward goals  Acute Rehab OT Goals Patient Stated Goal: return home OT Goal Formulation: With patient Time For Goal Achievement: 10/13/16 Potential to Achieve Goals: Good ADL Goals Additional ADL Goal #1: Pt will indepdently verbalize three energy conservation techniques Additional ADL Goal #2: Pt will increase activity tolerance as seen by maintaining standing during an ADL/IADLs for 8-10 min  Plan Discharge plan remains appropriate    Co-evaluation                 AM-PAC PT "6 Clicks" Daily Activity     Outcome Measure   Help from another person eating meals?: None Help from another person taking care of personal grooming?: None Help from another person toileting, which includes using toliet, bedpan, or urinal?: A Little Help from another person bathing (including washing, rinsing, drying)?: A Little Help from another person to put on and taking off regular upper body clothing?: None Help from another person to put on and taking off regular lower body clothing?: A Little 6 Click Score: 21    End of Session Equipment Utilized During Treatment: Rolling walker  OT Visit Diagnosis: Unsteadiness on feet (R26.81);Pain;Muscle weakness (generalized) (M62.81) Pain - Right/Left:  (bilateral) Pain - part of body: Arm;Leg   Activity Tolerance Patient tolerated treatment well   Patient Left in chair;with call bell/phone within reach   Nurse Communication Mobility status         Time: 8891-6945 OT Time Calculation (min): 28 min  Charges: OT General Charges $OT Visit: 1 Procedure OT Treatments $Self Care/Home Management : 23-37 mins  [place], OTR/L Acute Rehab Pager: [phone] Office: [place] 10/02/2016, 8:58 AM

## 2016-10-02 NOTE — Discharge Summary (Signed)
[facility] Discharge Summary  Patient name: [name] Marks Medical record number: [id number] Date of birth: [birth year] Age: 61 y.o. Gender: female Date of Admission: 09/28/2016  Date of Discharge: 10/02/16 Admitting Physician: Blane Ohara McDiarmid, MD  Primary Care Provider: Tonette Bihari, MD Consultants: Nephrology, Neurology, Electrophysiology  Indication for Hospitalization: right sided facial droop and expressive aphasia  Discharge Diagnoses/Problem List:  1.  TIA - patient presented with R facial droop and aphasia.  She has had TIA's in the past.  MRI was negative for acute intracranial process.  She was taking 325 mg ASA at the time of her TIA, so this medication was discontinued, and she began Plavix 75 mg daily.  Her symptoms resolved on the day of admission, and she was asymptomatic for the remainder of her hospital stay.  2.  ESRD - Patient gets MWF dialysis.  She takes Renvela and Sensipar.  Disposition: home  Discharge Condition: stable, improved  Discharge Exam: please see progress note from day of discharge  Brief Hospital Course:  [name] Marks was admitted on 09/28/16 for right sided facial droop and aphasia.  MRI was negative, and her symptoms resolved on the day of admission.  Risk stratification lab results included Cholesterol 147 Triglycerides 83 HDL 66 LDL 64, Hgb A1c of 4.3.  Carotid dopplers showed 1-39% ICA plaquing, mild diffuse mixed plaque in CCA, ICA, and ECA.  Echo on 8/5 negative for blood clot. TEE positive for small PFO, so lower extremity dopplers were done, which were negative for DVT.  She received an implanted loop recorder to monitor for paroxysmal atrial fibrillation.  Neurology requested a follow-up appointment at their stroke clinic 6 weeks after discharge.    Issues for Follow Up:  1. Monitor for symptoms related to repeat TIA's or atrial fibrillation.  Loop recorder will be useful in determining the presence of any  atrial fibrillation, which will necessitate anticoagulation if found. 2. Blood pressure control is important in this patient with history of TIA's.  Patient does not have a history of hypertension and is actually on midodrine for hypotension in the past.  Continue to monitor blood pressure and adjust medications as necessary.  Significant Procedures: TTE, TEE, loop recorder implantation, carotid dopplers, venous dopplers  Significant Labs and Imaging:   Recent Labs Lab 09/28/16 0632 09/28/16 0637 10/01/16 0445 10/02/16 0523  WBC 4.8  --  5.1 4.8  HGB 11.0* 11.6* 10.5* 11.3*  HCT 33.7* 34.0* 32.7* 34.8*  PLT 190  --  230 256    Recent Labs Lab 09/28/16 0632 09/28/16 0637 09/29/16 0531 10/01/16 0445 10/02/16 0523  NA 141 142 135 136 135  K 3.8 3.7 4.0 4.3 4.1  CL 105 104 99* 100* 98*  CO2 26  --  27 27 28   GLUCOSE 88 86 84 85 93  BUN 24* 25* 9 16 7   CREATININE 6.25* 6.20* 4.10* 7.71* 4.56*  CALCIUM 9.4  --  9.5 9.2 8.9  PHOS  --   --  2.7 3.0 2.7  ALKPHOS 239*  --   --   --   --   AST 32  --   --   --   --   ALT 23  --   --   --   --   ALBUMIN 3.3*  --  3.1* 2.9* 3.1*    Mr [name] Head Wo Contrast  Result Date: 09/28/2016 CLINICAL DATA:  Acute presentation or speech disturbance and right-sided weakness. EXAM: MRI  HEAD WITHOUT CONTRAST MRA HEAD WITHOUT CONTRAST TECHNIQUE: Multiplanar, multiecho pulse sequences of the brain and surrounding structures were obtained without intravenous contrast. Angiographic images of the head were obtained using MRA technique without contrast. COMPARISON:  CT same day.  MRI 05/17/2016. FINDINGS: MRI HEAD FINDINGS Brain: Diffusion imaging does not show any acute or subacute infarction. The brainstem is normal. There is cerebellar atrophy. Cerebral hemispheres show atrophy with moderate chronic small-vessel ischemic changes affecting the deep and subcortical white matter. No visible change since the study of March. No mass lesion, hemorrhage,  hydrocephalus or extra-axial collection. Vascular: Major vessels at the base of the brain show flow. Skull and upper cervical spine: Negative Sinuses/Orbits: Clear/normal Other: None significant MRA HEAD FINDINGS Motion degraded study compared to the previous exam. Both internal carotid arteries are widely patent through the skullbase and siphon regions. The anterior and middle cerebral vessels are patent without proximal stenosis, aneurysm or vascular malformation. More distal branch vessels show atherosclerotic irregularity, more notable in the left anterior cerebral and middle cerebral branches. Fetal origin posterior cerebral arteries bilaterally. Posterior circulation is very diminutive. Small vertebral arteries. Left vertebral artery terminates in PICA. Right vertebral artery supplies a small basilar. Limited detail because of the motion degradation. IMPRESSION: No sign of acute or subacute infarction. Atrophy and moderate chronic small-vessel ischemic changes, without visible change since 05/17/2016. MR angiography does not show any anterior circulation large or medium vessel lesion. More distal branch vessels show atherosclerotic irregularity and narrowing, more notable in the left MCA and ACA regions. The study suffers from motion degradation. The posterior circulation is quite diminutive secondary to fetal origin of the posterior cerebral arteries. Electronically Signed   By: Nelson Chimes M.D.   On: 09/28/2016 08:10   Mr Brain Wo Contrast  Result Date: 09/28/2016 CLINICAL DATA:  Acute presentation or speech disturbance and right-sided weakness. EXAM: MRI HEAD WITHOUT CONTRAST MRA HEAD WITHOUT CONTRAST TECHNIQUE: Multiplanar, multiecho pulse sequences of the brain and surrounding structures were obtained without intravenous contrast. Angiographic images of the head were obtained using MRA technique without contrast. COMPARISON:  CT same day.  MRI 05/17/2016. FINDINGS: MRI HEAD FINDINGS Brain: Diffusion  imaging does not show any acute or subacute infarction. The brainstem is normal. There is cerebellar atrophy. Cerebral hemispheres show atrophy with moderate chronic small-vessel ischemic changes affecting the deep and subcortical white matter. No visible change since the study of March. No mass lesion, hemorrhage, hydrocephalus or extra-axial collection. Vascular: Major vessels at the base of the brain show flow. Skull and upper cervical spine: Negative Sinuses/Orbits: Clear/normal Other: None significant MRA HEAD FINDINGS Motion degraded study compared to the previous exam. Both internal carotid arteries are widely patent through the skullbase and siphon regions. The anterior and middle cerebral vessels are patent without proximal stenosis, aneurysm or vascular malformation. More distal branch vessels show atherosclerotic irregularity, more notable in the left anterior cerebral and middle cerebral branches. Fetal origin posterior cerebral arteries bilaterally. Posterior circulation is very diminutive. Small vertebral arteries. Left vertebral artery terminates in PICA. Right vertebral artery supplies a small basilar. Limited detail because of the motion degradation. IMPRESSION: No sign of acute or subacute infarction. Atrophy and moderate chronic small-vessel ischemic changes, without visible change since 05/17/2016. MR angiography does not show any anterior circulation large or medium vessel lesion. More distal branch vessels show atherosclerotic irregularity and narrowing, more notable in the left MCA and ACA regions. The study suffers from motion degradation. The posterior circulation is quite diminutive secondary to  fetal origin of the posterior cerebral arteries. Electronically Signed   By: Nelson Chimes M.D.   On: 09/28/2016 08:10   Ct Head Code Stroke Wo Contrast  Result Date: 09/28/2016 CLINICAL DATA:  Code stroke. Initial evaluation for acute speech difficulty, right-sided weakness. EXAM: CT HEAD WITHOUT  CONTRAST TECHNIQUE: Contiguous axial images were obtained from the base of the skull through the vertex without intravenous contrast. COMPARISON:  Prior CT from 05/16/2016 as well as previous MRI from 05/17/2016. FINDINGS: Brain: Stable atrophy with moderate chronic small vessel ischemic disease. No acute intracranial hemorrhage. No evidence for acute large vessel territory infarct. No mass lesion, midline shift or mass effect. Ventricular prominence related to global parenchymal volume loss of hydrocephalus. No extra-axial fluid collection. Vascular: No hyperdense vessel. Scattered vascular calcifications noted within the carotid siphons. Skull: Scalp soft tissues and calvarium within normal limits. Sinuses/Orbits: Globes and orbital soft tissues within normal limits. Paranasal sinuses are clear. No mastoid effusion. ASPECTS [facility] Stroke Program Early CT Score) - Ganglionic level infarction (caudate, lentiform nuclei, internal capsule, insula, M1-M3 cortex): 7 - Supraganglionic infarction (M4-M6 cortex): 3 Total score (0-10 with 10 being normal): 10 IMPRESSION: 1. No acute intracranial infarct or other process identified 2. ASPECTS is 10. 3. Stable atrophy with chronic small vessel ischemic disease. Critical Value/emergent results were called by telephone at the time of interpretation on 09/28/2016 at 6:52 am to Dr. Leonel Ramsay, who verbally acknowledged these results. Electronically Signed   By: Jeannine Boga M.D.   On: 09/28/2016 06:53    Results/Tests Pending at Time of Discharge: none  Discharge Medications:  Allergies as of 10/02/2016      Reactions   Contrast Media [iodinated Diagnostic Agents] Anaphylaxis   Naproxen Sodium Itching      Medication List    STOP taking these medications   aspirin 325 MG EC tablet   ibuprofen 600 MG tablet Commonly known as:  ADVIL,MOTRIN   megestrol 40 MG tablet Commonly known as:  MEGACE     TAKE these medications   cinacalcet 60 MG  tablet Commonly known as:  SENSIPAR Take 120 mg by mouth every Monday, Wednesday, and Friday with hemodialysis.   clopidogrel 75 MG tablet Commonly known as:  PLAVIX Take 1 tablet (75 mg total) by mouth daily.   famotidine 20 MG tablet Commonly known as:  PEPCID Take 20 mg by mouth See admin instructions. Lunch and dinner   midodrine 10 MG tablet Commonly known as:  PROAMATINE Take 1 tablet (10 mg total) by mouth 2 (two) times daily as needed (30 min prior to HD).   NEPRO Liqd Take 237 mLs by mouth every dialysis (Mon, Wed, Fri).   PRORENAL QD PO Take 1 tablet by mouth daily.   RENVELA 800 MG tablet Generic drug:  sevelamer carbonate Take 800 mg by mouth 3 (three) times daily with meals.       Discharge Instructions: Please refer to Patient Instructions section of EMR for full details.  Patient was counseled important signs and symptoms that should prompt return to medical care, changes in medications, dietary instructions, activity restrictions, and follow up appointments.   Follow-Up Appointments: Follow-up Information    Garvin Fila, MD. Schedule an appointment as soon as possible for a visit in 6 week(s).   Specialties:  Neurology, Radiology Contact information: [place] [address] [phone]        [facility] Follow up on 10/17/2016.   Specialty:  Cardiology  Why:  at [facility] for wound check  Contact information: [address], [place] Follow up.   Specialty:  Vascular Surgery Contact information: [address] [id number] mc [address] [place] [address] [phone]       Tonette Bihari, MD .   Specialty:  [facility] Medicine Contact information: [place] [address] [phone]           Kathrene Alu, MD 10/03/2016, 7:34 PM PGY-1, [place]

## 2016-10-02 NOTE — Progress Notes (Signed)
Physical Therapy Treatment Patient Details Name: [name] Marks MRN: [id number] DOB: [birth year] Today's Date: 10/02/2016    History of Present Illness Pt is a 61 y/o female admitted secondary to aphasia. MRI was negative. PMH including but not limited to ESRD (HD M-W-F), HTN, CVA, AVR 2017.    PT Comments    Continuing work on functional mobility and activity tolerance; pt presented awake, alert and eager to get up and begin walking. Pt needed no cues or physical assistance with transfers or ambulating with 2-wheel walker; although, needed cues on how to properly use SPC during ambulation. Pt completed 4 stairs with SPC with min guard ascending and mod assist when descending for safety and stability. Pt progressing towards goals of being d/c home and would benefit from HHPT to increase strength, mobility and endurance.   Follow Up Recommendations  Home health PT     Equipment Recommendations  None recommended by PT    Recommendations for Other Services       Precautions / Restrictions Precautions Precautions: Fall Restrictions Weight Bearing Restrictions: No    Mobility  Bed Mobility               General bed mobility comments: Up in recliner upon arrival  Transfers Overall transfer level: Modified independent Equipment used: None Transfers: Sit to/from Stand Sit to Stand: Modified independent (Device/Increase time)            Ambulation/Gait Ambulation/Gait assistance: Supervision Ambulation Distance (Feet): 250 Feet Assistive device: Rolling walker (2 wheeled);Straight cane Gait Pattern/deviations: Decreased stride length;Step-through pattern Gait velocity: decreased Gait velocity interpretation: Below normal speed for age/gender General Gait Details: Mod I with 2-wheel walker. Supervision when using the cane due to need of cues.    Stairs Stairs: Yes   Stair Management: One rail Right;One rail Left;Step to pattern;With cane Number of Stairs:  4 General stair comments: Pt able to go up stairs with min guard, but needed mod assist when going down the stairs for safety.   Wheelchair Mobility    Modified Rankin (Stroke Patients Only)       Balance Overall balance assessment: Modified Independent Sitting-balance support: Feet supported Sitting balance-Leahy Scale: Normal     Standing balance support: During functional activity Standing balance-Leahy Scale: Good                              Cognition Arousal/Alertness: Awake/alert Behavior During Therapy: WFL for tasks assessed/performed Overall Cognitive Status: Within Functional Limits for tasks assessed                                        Exercises      General Comments General comments (skin integrity, edema, etc.): Pt very motivated to participate in therapy to increase endurance before d/c to home.      Pertinent Vitals/Pain Pain Assessment: 0-10 Pain Score: 5  Pain Location: bilateral LEs Pain Descriptors / Indicators: Sore Pain Intervention(s): Monitored during session    Home Living                      Prior Function            PT Goals (current goals can now be found in the care plan section) Acute Rehab PT Goals Patient Stated Goal: return home PT Goal Formulation: With patient Time For  Goal Achievement: 10/13/16 Potential to Achieve Goals: Good Progress towards PT goals: Progressing toward goals    Frequency    Min 3X/week      PT Plan Current plan remains appropriate    Co-evaluation              AM-PAC PT "6 Clicks" Daily Activity  Outcome Measure  Difficulty turning over in bed (including adjusting bedclothes, sheets and blankets)?: None Difficulty moving from lying on back to sitting on the side of the bed? : None Difficulty sitting down on and standing up from a chair with arms (e.g., wheelchair, bedside commode, etc,.)?: None Help needed moving to and from a bed to chair  (including a wheelchair)?: A Little Help needed walking in hospital room?: A Little Help needed climbing 3-5 steps with a railing? : A Lot 6 Click Score: 20    End of Session Equipment Utilized During Treatment: Gait belt Activity Tolerance: Patient tolerated treatment well Patient left: in chair;with call bell/phone within reach   PT Visit Diagnosis: Unsteadiness on feet (R26.81);Other abnormalities of gait and mobility (R26.89)     Time: 1450-1505 PT Time Calculation (min) (ACUTE ONLY): 15 min  Charges:      10/02/16 1500  PT Time Calculation  PT Start Time (ACUTE ONLY) 1450  PT Stop Time (ACUTE ONLY) 1505  PT Time Calculation (min) (ACUTE ONLY) 15 min  PT General Charges  $$ ACUTE PT VISIT 1 Visit  PT Treatments  $Gait Training 8-22 mins                       G Codes:       Shella Maxim, SPT Acute Rehabilitation Services Office: [phone]    Roderic Palau 10/02/2016, 3:26 PM

## 2016-10-03 ENCOUNTER — Other Ambulatory Visit: Payer: Self-pay | Admitting: Gynecology

## 2016-10-03 ENCOUNTER — Telehealth: Payer: Self-pay | Admitting: *Deleted

## 2016-10-03 DIAGNOSIS — Z8673 Personal history of transient ischemic attack (TIA), and cerebral infarction without residual deficits: Secondary | ICD-10-CM | POA: Diagnosis not present

## 2016-10-03 DIAGNOSIS — Z780 Asymptomatic menopausal state: Secondary | ICD-10-CM

## 2016-10-03 DIAGNOSIS — N186 End stage renal disease: Secondary | ICD-10-CM | POA: Diagnosis not present

## 2016-10-03 DIAGNOSIS — Z23 Encounter for immunization: Secondary | ICD-10-CM | POA: Diagnosis not present

## 2016-10-03 DIAGNOSIS — D509 Iron deficiency anemia, unspecified: Secondary | ICD-10-CM | POA: Diagnosis not present

## 2016-10-03 DIAGNOSIS — D631 Anemia in chronic kidney disease: Secondary | ICD-10-CM | POA: Diagnosis not present

## 2016-10-03 DIAGNOSIS — Z992 Dependence on renal dialysis: Secondary | ICD-10-CM | POA: Diagnosis not present

## 2016-10-03 DIAGNOSIS — N2581 Secondary hyperparathyroidism of renal origin: Secondary | ICD-10-CM | POA: Diagnosis not present

## 2016-10-03 DIAGNOSIS — I272 Pulmonary hypertension, unspecified: Secondary | ICD-10-CM | POA: Diagnosis not present

## 2016-10-03 DIAGNOSIS — Z952 Presence of prosthetic heart valve: Secondary | ICD-10-CM | POA: Diagnosis not present

## 2016-10-03 DIAGNOSIS — E669 Obesity, unspecified: Secondary | ICD-10-CM | POA: Diagnosis not present

## 2016-10-03 DIAGNOSIS — M109 Gout, unspecified: Secondary | ICD-10-CM | POA: Diagnosis not present

## 2016-10-03 DIAGNOSIS — I120 Hypertensive chronic kidney disease with stage 5 chronic kidney disease or end stage renal disease: Secondary | ICD-10-CM | POA: Diagnosis not present

## 2016-10-03 NOTE — Telephone Encounter (Signed)
[name] Marks, Physical Therapist with [facility] called to request verbal order for physical therapy. Physical therapy twice a week for four weeks given.  Derl Barrow, RN

## 2016-10-03 NOTE — Telephone Encounter (Signed)
Please provide verbal orders. Thanks Orlandria Kissner

## 2016-10-04 ENCOUNTER — Encounter (HOSPITAL_COMMUNITY): Payer: Self-pay

## 2016-10-04 ENCOUNTER — Emergency Department (HOSPITAL_COMMUNITY)
Admission: EM | Admit: 2016-10-04 | Discharge: 2016-10-04 | Disposition: A | Discharge status: Home / Self Care | Payer: Medicare Other | Attending: Emergency Medicine | Admitting: Emergency Medicine

## 2016-10-04 DIAGNOSIS — Z8673 Personal history of transient ischemic attack (TIA), and cerebral infarction without residual deficits: Secondary | ICD-10-CM | POA: Insufficient documentation

## 2016-10-04 DIAGNOSIS — Z952 Presence of prosthetic heart valve: Secondary | ICD-10-CM | POA: Diagnosis not present

## 2016-10-04 DIAGNOSIS — N186 End stage renal disease: Secondary | ICD-10-CM | POA: Insufficient documentation

## 2016-10-04 DIAGNOSIS — I669 Occlusion and stenosis of unspecified cerebral artery: Secondary | ICD-10-CM | POA: Diagnosis not present

## 2016-10-04 DIAGNOSIS — G459 Transient cerebral ischemic attack, unspecified: Secondary | ICD-10-CM | POA: Insufficient documentation

## 2016-10-04 DIAGNOSIS — Z992 Dependence on renal dialysis: Secondary | ICD-10-CM | POA: Diagnosis not present

## 2016-10-04 DIAGNOSIS — Z79899 Other long term (current) drug therapy: Secondary | ICD-10-CM | POA: Insufficient documentation

## 2016-10-04 DIAGNOSIS — R531 Weakness: Secondary | ICD-10-CM | POA: Diagnosis not present

## 2016-10-04 DIAGNOSIS — R404 Transient alteration of awareness: Secondary | ICD-10-CM | POA: Diagnosis not present

## 2016-10-04 LAB — I-STAT CHEM 8, ED
BUN: 5 mg/dL — ABNORMAL LOW (ref 6–20)
Calcium, Ion: 1.11 mmol/L — ABNORMAL LOW (ref 1.15–1.40)
Chloride: 100 mmol/L — ABNORMAL LOW (ref 101–111)
Creatinine, Ser: 4.7 mg/dL — ABNORMAL HIGH (ref 0.44–1.00)
GLUCOSE: 80 mg/dL (ref 65–99)
HCT: 37 % (ref 36.0–46.0)
HEMOGLOBIN: 12.6 g/dL (ref 12.0–15.0)
POTASSIUM: 5.5 mmol/L — AB (ref 3.5–5.1)
Sodium: 137 mmol/L (ref 135–145)
TCO2: 32 mmol/L (ref 0–100)

## 2016-10-04 LAB — CBG MONITORING, ED: GLUCOSE-CAPILLARY: 81 mg/dL (ref 65–99)

## 2016-10-04 MED ORDER — CLOPIDOGREL BISULFATE 75 MG PO TABS
75.0000 mg | ORAL_TABLET | Freq: Once | ORAL | Status: AC
  Administered 2016-10-04: 75 mg via ORAL
  Filled 2016-10-04: qty 1

## 2016-10-04 MED ORDER — SODIUM POLYSTYRENE SULFONATE 15 GM/60ML PO SUSP
30.0000 g | Freq: Once | ORAL | Status: DC
  Filled 2016-10-04: qty 120

## 2016-10-04 MED ORDER — ACETAMINOPHEN 325 MG PO TABS
650.0000 mg | ORAL_TABLET | Freq: Once | ORAL | Status: AC
  Administered 2016-10-04: 650 mg via ORAL
  Filled 2016-10-04: qty 2

## 2016-10-04 MED ORDER — SODIUM POLYSTYRENE SULFONATE 15 GM/60ML PO SUSP
30.0000 g | Freq: Once | ORAL | Status: DC
Start: 2016-10-04 — End: 2016-10-04

## 2016-10-04 NOTE — Discharge Instructions (Signed)
Start taking her Plavix and follow-up as instructed when you're discharged from the hospital last time

## 2016-10-04 NOTE — ED Notes (Signed)
Dr Zammit at bedside. 

## 2016-10-04 NOTE — ED Notes (Signed)
Loop recorder interrogated by this RN.  

## 2016-10-04 NOTE — Telephone Encounter (Signed)
No phone number provided, if Threasa Beards calls back please provide verbal orders.

## 2016-10-04 NOTE — ED Provider Notes (Addendum)
[facility] DEPT Provider Note   CSN: [id number] Arrival date & time: 10/04/16  1309     History   Chief Complaint Chief Complaint  Patient presents with  . Weakness    HPI [name] Marks is a 61 y.o. female.  Patient states that she had about 30 minutes of left arm pain and numbness on the left side of her face. 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 She just was discharged in the hospital couple days ago and was supposed to start taking Plavix but she has not done it yet.   The history is provided by the patient. No language interpreter was used.  Weakness  Primary symptoms include focal weakness. This is a recurrent problem. The current episode started less than 1 hour ago. The problem has been resolved. There was left upper extremity focality noted. There has been no fever. Pertinent negatives include no shortness of breath, no chest pain and no headaches.    Past Medical History:  Diagnosis Date  . Complication of anesthesia    due to kidney disease  . End stage renal disease on dialysis ([place])   . GERD (gastroesophageal reflux disease)   . Gout   . Septic shock ([place])   . Staphylococcus aureus bacteremia     Patient Active Problem List   Diagnosis Date Noted  . Transient cerebral ischemia   . AVD (aortic valve disease) 07/12/2016  . Pulmonary HTN ([place]) 07/12/2016  . Carpal tunnel syndrome 07/05/2016  . Swallowing difficulty 06/15/2016  . 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 Right leg pain 05/29/2016  . CVA (cerebral vascular accident) ([place]) 05/16/2016  . Aphasia   . History of aortic valve replacement with bioprosthetic valve 05/08/2016  . Aortic valve prosthesis present 02/25/2016  . Bacteremia due to coagulase-negative Staphylococcus   . S/P dialysis catheter insertion ([place])   . Bilateral low back pain with sciatica   . Neck pain   . Endocarditis   . Anemia   . Renal dialysis device, implant, or graft complication 96/[id number]/9381  . Pain and swelling of right  upper extremity 12/20/2015  . Complex endometrial hyperplasia with atypia 11/08/2015  . Increased endometrial stripe thickness 11/03/2015  . Obesity 10/28/2014  . Shoulder pain, bilateral 10/28/2014  . Health care maintenance 10/28/2014  . Right carotid bruit 01/08/2013  . Generalized headaches 01/08/2013  . Chronic female pelvic pain 08/08/2012  . Gout, unspecified 12/06/2008  . Esophageal reflux 12/06/2008  . BACK PAIN 12/06/2008  . FIBROIDS, UTERUS 11/24/2008  . ESRD (end stage renal disease) on dialysis ([place]) 07/01/2006    Past Surgical History:  Procedure Laterality Date  . AORTIC VALVE REPLACEMENT N/A 02/25/2016   Procedure: AORTIC VALVE REPLACEMENT (AVR) implanted with Magna Ease Aortic valve size 81mm;  Surgeon: Melrose Nakayama, MD;  Location: [facility];  Service: Open Heart Surgery;  Laterality: N/A;  . [place] Right 02/17/2016   Procedure: REMOVAL OF TWO ARTERIOVENOUS GORETEX GRAFTS ([place]);  Surgeon: Angelia Mould, MD;  Location: [facility];  Service: Vascular;  Laterality: Right;  . BREAST BIOPSY     stereo left 2013  . BREAST BIOPSY     stereo right 2011  . DG AV DIALYSIS GRAFT DECLOT OR    . DILATATION & CURETTAGE/HYSTEROSCOPY WITH TRUECLEAR N/A 11/06/2012   Procedure: DILATATION & CURETTAGE/HYSTEROSCOPY WITH TRUECLEAR;  Surgeon: Terrance Mass, MD;  Location: [facility];  Service: Gynecology;  Laterality: N/A;  Truclear Resectoscopic Polypectomy   . INSERTION OF DIALYSIS  CATHETER N/A 02/19/2016   Procedure: INSERTION OF Left Internal Jugular DIALYSIS CATHETER;  Surgeon: Angelia Mould, MD;  Location: [facility];  Service: Vascular;  Laterality: N/A;  . LOOP RECORDER INSERTION N/A 10/01/2016   Procedure: LOOP RECORDER INSERTION;  Surgeon: Constance Haw, MD;  Location: [facility];  Service: Cardiovascular;  Laterality: N/A;  . PATCH ANGIOPLASTY Right 02/17/2016   Procedure: PATCH ANGIOPLASTY;  Surgeon: Angelia Mould, MD;  Location: [facility];   Service: Vascular;  Laterality: Right;  . PERIPHERAL VASCULAR CATHETERIZATION N/A 09/14/2014   Procedure: A/V Shuntogram/Fistulagram;  Surgeon: Katha Cabal, MD;  Location: [facility];  Service: Cardiovascular;  Laterality: N/A;  . PERIPHERAL VASCULAR CATHETERIZATION N/A 09/14/2014   Procedure: A/V Shunt Intervention;  Surgeon: Katha Cabal, MD;  Location: [facility];  Service: Cardiovascular;  Laterality: N/A;  . PERIPHERAL VASCULAR CATHETERIZATION Right 12/09/2014   Procedure: A/V Shuntogram/Fistulagram;  Surgeon: Algernon Huxley, MD;  Location: [facility];  Service: Cardiovascular;  Laterality: Right;  . PERIPHERAL VASCULAR CATHETERIZATION N/A 12/09/2014   Procedure: A/V Shunt Intervention;  Surgeon: Algernon Huxley, MD;  Location: [facility];  Service: Cardiovascular;  Laterality: N/A;  . PERIPHERAL VASCULAR CATHETERIZATION Right 05/24/2015   Procedure: A/V Shuntogram;  Surgeon: Serafina Mitchell, MD;  Location: [facility];  Service: Cardiovascular;  Laterality: Right;  . PERIPHERAL VASCULAR CATHETERIZATION Right 05/24/2015   Procedure: Peripheral Vascular Balloon Angioplasty;  Surgeon: Serafina Mitchell, MD;  Location: [facility];  Service: Cardiovascular;  Laterality: Right;  right arm shunt  . PERIPHERAL VASCULAR CATHETERIZATION N/A 06/13/2015   Procedure: A/V Shuntogram/Fistulagram;  Surgeon: Algernon Huxley, MD;  Location: [facility];  Service: Cardiovascular;  Laterality: N/A;  . PERIPHERAL VASCULAR CATHETERIZATION N/A 06/13/2015   Procedure: A/V Shunt Intervention;  Surgeon: Algernon Huxley, MD;  Location: [facility];  Service: Cardiovascular;  Laterality: N/A;  . TEE WITHOUT CARDIOVERSION N/A 02/22/2016   Procedure: TRANSESOPHAGEAL ECHOCARDIOGRAM (TEE);  Surgeon: Pixie Casino, MD;  Location[facility];  Service: Cardiovascular;  Laterality: N/A;  . TEE WITHOUT CARDIOVERSION N/A 02/25/2016   Procedure: TRANSESOPHAGEAL  ECHOCARDIOGRAM (TEE);  Surgeon: Melrose Nakayama, MD;  Location: [facility];  Service: Open Heart Surgery;  Laterality: N/A;  . TEE WITHOUT CARDIOVERSION N/A 10/01/2016   Procedure: TRANSESOPHAGEAL ECHOCARDIOGRAM (TEE);  Surgeon: Pixie Casino, MD;  Location[facility];  Service: Cardiovascular;  Laterality: N/A;  . TUBAL LIGATION  1983    OB History    Gravida Para Term Preterm AB Living   2 2       2    SAB TAB Ectopic Multiple Live Births                   Home Medications    Prior to Admission medications   Medication Sig Start Date End Date Taking? Authorizing Provider  cinacalcet (SENSIPAR) 60 MG tablet Take 120 mg by mouth every Monday, Wednesday, and Friday with hemodialysis.    Yes [provider]  famotidine (PEPCID) 20 MG tablet Take 20 mg by mouth 2 (two) times daily. Lunch and dinner   Yes [provider]  midodrine (PROAMATINE) 10 MG tablet Take 1 tablet (10 mg total) by mouth 2 (two) times daily as needed (30 min prior to HD). 08/27/16  Yes Mikell, Jeani Sow, MD  Multiple Vitamins-Minerals (PRORENAL QD PO) Take 1 tablet by mouth daily.  Yes [provider]  sevelamer (RENVELA) 800 MG tablet Take 800 mg by mouth 3 (three) times daily with meals.    Yes [provider]  clopidogrel (PLAVIX) 75 MG tablet Take 1 tablet (75 mg total) by mouth daily. Patient not taking: Reported on 10/04/2016 10/03/16   Carlyle Dolly, MD    Family History Family History  Problem Relation Age of Onset  . Diabetes Mother   . Hypertension Mother   . Heart disease Mother   . Alcohol abuse Mother   . Kidney disease Mother   . Cancer Father        COLON  . Alcohol abuse Father   . Breast cancer Sister 19  . Hypertension Sister   . Kidney disease Sister   . Hypertension Son   . Kidney disease Son     Social History Social History  Substance Use Topics  . Smoking status: Never Smoker  . Smokeless tobacco: Never Used  . Alcohol use No      Allergies   Contrast media [iodinated diagnostic agents] and Naproxen sodium   Review of Systems Review of Systems  Constitutional: Negative for appetite change and fatigue.  HENT: Negative for congestion, ear discharge and sinus pressure.   Eyes: Negative for discharge.  Respiratory: Negative for cough and shortness of breath.   Cardiovascular: Negative for chest pain.  Gastrointestinal: Negative for abdominal pain and diarrhea.  Genitourinary: Negative for frequency and hematuria.  Musculoskeletal: Negative for back pain.  Skin: Negative for rash.  Neurological: Positive for focal weakness and weakness. Negative for seizures and headaches.  Psychiatric/Behavioral: Negative for hallucinations.     Physical Exam Updated Vital Signs BP (!) 150/76   Temp 98.2 F (36.8 C)   Resp 16   Ht 5\' 6"  (1.676 m)   Wt 76.2 kg (168 lb)   BMI 27.12 kg/m   Physical Exam  Constitutional: She is oriented to person, place, and time. She appears well-developed.  HENT:  Head: Normocephalic.  Eyes: Conjunctivae and EOM are normal. No scleral icterus.  Neck: Neck supple. No thyromegaly present.  Cardiovascular: Normal rate and regular rhythm.  Exam reveals no gallop and no friction rub.   No murmur heard. Pulmonary/Chest: No stridor. She has no wheezes. She has no rales. She exhibits no tenderness.  Abdominal: She exhibits no distension. There is no tenderness. There is no rebound.  Musculoskeletal: Normal range of motion. She exhibits no edema.  Lymphadenopathy:    She has no cervical adenopathy.  Neurological: She is oriented to person, place, and time. She exhibits normal muscle tone. Coordination normal.  Skin: No rash noted. No erythema.  Psychiatric: She has a normal mood and affect. Her behavior is normal.     ED Treatments / Results  Labs (all labs ordered are listed, but only abnormal results are displayed) Labs Reviewed  I-STAT CHEM 8, ED - Abnormal; Notable for the  following:       Result Value   Potassium 5.5 (*)    Chloride 100 (*)    BUN 5 (*)    Creatinine, Ser 4.70 (*)    Calcium, Ion 1.11 (*)    All other components within normal limits  CBG MONITORING, ED    EKG  EKG Interpretation None       Radiology No results found.  Procedures Procedures (including critical care time)  Medications Ordered in ED Medications  sodium polystyrene (KAYEXALATE) 15 GM/60ML suspension 30 g (not administered)  clopidogrel (PLAVIX) tablet  75 mg (75 mg Oral Given 10/04/16 1446)  acetaminophen (TYLENOL) tablet 650 mg (650 mg Oral Given 10/04/16 1446)     Initial Impression / Assessment and Plan / ED Course  I have reviewed the triage vital signs and the nursing notes.  Pertinent labs & imaging results that were available during my care of the patient were reviewed by me and considered in my medical decision making (see chart for details).     Patient had TIA. I spoke to neurology and their recommendation was to check her loop recorder to make sure that she did not have an episode of atrial fib. If that is okay she can be discharged with the Plavix. Patient has been given 1 Plavix here in emergency department Patient's loop recorder did not show any atrial fib. She refused the Kayexalate. She stated she will go to her dialysis tomorrow Final Clinical Impressions(s) / ED Diagnoses   Final diagnoses:  None    New Prescriptions New Prescriptions   No medications on file     Milton Ferguson, MD 10/04/16 [place]    Milton Ferguson, MD 10/04/16 1547

## 2016-10-04 NOTE — Telephone Encounter (Signed)
Verbal order approval was given. ep

## 2016-10-04 NOTE — ED Triage Notes (Signed)
Pt from home with ems c/o left sided weakness that started at 1145 this am, symptoms have now resolved. Pt seen here a few days ago and d/c with plavix due to hx of TIAs pt has not yet filled her prescription. No deficits noted upon arrival pt a/o, vss, nad

## 2016-10-05 DIAGNOSIS — N186 End stage renal disease: Secondary | ICD-10-CM | POA: Diagnosis not present

## 2016-10-05 DIAGNOSIS — Z23 Encounter for immunization: Secondary | ICD-10-CM | POA: Diagnosis not present

## 2016-10-05 DIAGNOSIS — D631 Anemia in chronic kidney disease: Secondary | ICD-10-CM | POA: Diagnosis not present

## 2016-10-05 DIAGNOSIS — D509 Iron deficiency anemia, unspecified: Secondary | ICD-10-CM | POA: Diagnosis not present

## 2016-10-05 DIAGNOSIS — N2581 Secondary hyperparathyroidism of renal origin: Secondary | ICD-10-CM | POA: Diagnosis not present

## 2016-10-08 DIAGNOSIS — D631 Anemia in chronic kidney disease: Secondary | ICD-10-CM | POA: Diagnosis not present

## 2016-10-08 DIAGNOSIS — N186 End stage renal disease: Secondary | ICD-10-CM | POA: Diagnosis not present

## 2016-10-08 DIAGNOSIS — D509 Iron deficiency anemia, unspecified: Secondary | ICD-10-CM | POA: Diagnosis not present

## 2016-10-08 DIAGNOSIS — Z23 Encounter for immunization: Secondary | ICD-10-CM | POA: Diagnosis not present

## 2016-10-08 DIAGNOSIS — N2581 Secondary hyperparathyroidism of renal origin: Secondary | ICD-10-CM | POA: Diagnosis not present

## 2016-10-09 ENCOUNTER — Inpatient Hospital Stay: Payer: Medicare Other | Admitting: Internal Medicine

## 2016-10-09 DIAGNOSIS — N186 End stage renal disease: Secondary | ICD-10-CM | POA: Diagnosis not present

## 2016-10-09 DIAGNOSIS — D631 Anemia in chronic kidney disease: Secondary | ICD-10-CM | POA: Diagnosis not present

## 2016-10-09 DIAGNOSIS — I272 Pulmonary hypertension, unspecified: Secondary | ICD-10-CM | POA: Diagnosis not present

## 2016-10-09 DIAGNOSIS — Z8673 Personal history of transient ischemic attack (TIA), and cerebral infarction without residual deficits: Secondary | ICD-10-CM | POA: Diagnosis not present

## 2016-10-09 DIAGNOSIS — I120 Hypertensive chronic kidney disease with stage 5 chronic kidney disease or end stage renal disease: Secondary | ICD-10-CM | POA: Diagnosis not present

## 2016-10-09 DIAGNOSIS — M109 Gout, unspecified: Secondary | ICD-10-CM | POA: Diagnosis not present

## 2016-10-10 DIAGNOSIS — N2581 Secondary hyperparathyroidism of renal origin: Secondary | ICD-10-CM | POA: Diagnosis not present

## 2016-10-10 DIAGNOSIS — Z23 Encounter for immunization: Secondary | ICD-10-CM | POA: Diagnosis not present

## 2016-10-10 DIAGNOSIS — D509 Iron deficiency anemia, unspecified: Secondary | ICD-10-CM | POA: Diagnosis not present

## 2016-10-10 DIAGNOSIS — N186 End stage renal disease: Secondary | ICD-10-CM | POA: Diagnosis not present

## 2016-10-10 DIAGNOSIS — D631 Anemia in chronic kidney disease: Secondary | ICD-10-CM | POA: Diagnosis not present

## 2016-10-11 ENCOUNTER — Ambulatory Visit (INDEPENDENT_AMBULATORY_CARE_PROVIDER_SITE_OTHER): Payer: Medicare Other | Admitting: Internal Medicine

## 2016-10-11 ENCOUNTER — Encounter: Payer: Self-pay | Admitting: Internal Medicine

## 2016-10-11 VITALS — BP 102/58 | HR 76 | Temp 98.1°F | Ht 66.0 in | Wt 167.6 lb

## 2016-10-11 DIAGNOSIS — I120 Hypertensive chronic kidney disease with stage 5 chronic kidney disease or end stage renal disease: Secondary | ICD-10-CM | POA: Diagnosis not present

## 2016-10-11 DIAGNOSIS — N186 End stage renal disease: Secondary | ICD-10-CM | POA: Diagnosis not present

## 2016-10-11 DIAGNOSIS — I272 Pulmonary hypertension, unspecified: Secondary | ICD-10-CM | POA: Diagnosis not present

## 2016-10-11 DIAGNOSIS — D631 Anemia in chronic kidney disease: Secondary | ICD-10-CM | POA: Diagnosis not present

## 2016-10-11 DIAGNOSIS — I639 Cerebral infarction, unspecified: Secondary | ICD-10-CM | POA: Diagnosis not present

## 2016-10-11 DIAGNOSIS — G459 Transient cerebral ischemic attack, unspecified: Secondary | ICD-10-CM | POA: Diagnosis present

## 2016-10-11 DIAGNOSIS — Z8673 Personal history of transient ischemic attack (TIA), and cerebral infarction without residual deficits: Secondary | ICD-10-CM | POA: Diagnosis not present

## 2016-10-11 DIAGNOSIS — M109 Gout, unspecified: Secondary | ICD-10-CM | POA: Diagnosis not present

## 2016-10-11 DIAGNOSIS — Z992 Dependence on renal dialysis: Secondary | ICD-10-CM | POA: Diagnosis not present

## 2016-10-11 NOTE — Patient Instructions (Signed)
Happy to hear that your doing well. And that you are getting physical therapy. Please follow-up with me in the next 3 months just to see how things are gone, the outcomes of the loop recorder were.

## 2016-10-11 NOTE — Progress Notes (Signed)
   [facility] Kerrin Mo, MD Phone: [phone]  Reason For Visit: Hospital Follow up   Hospital Follow Up following TIA - Recently seen in hospital for TIA with R facial droop and aphasia on August 9th, CT head and MRI negative  - No numbness or weakness since hospital visit  - Loop recorder in place, has to go back next week to cardiology to get this interrogated  - Plans to follow up with neurology  - patient was started on plavix, has been taking this medication without issue  - Is currently getting home health physical therapy twice weekly, no issues with this   # Arotic valve  - No blood thinners required  - No issues doing well  - No fevers or chills   ESRD  - Has to get access in the left leg,  - Has tunneled cathter  - on dialysis days patient gets weak, but other days patient feels  Past Medical History Reviewed problem list.  Medications- reviewed and updated No additions to family history Social history- patient is a non-smoker  Objective: BP (!) 102/58   Pulse 76   Temp 98.1 F (36.7 C) (Oral)   Ht 5\' 6"  (1.676 m)   Wt 167 lb 9.6 oz (76 kg)   SpO2 98%   BMI 27.05 kg/m  Gen: NAD, alert, cooperative with exam  Cardio: regular rate and rhythm, S1S2 heard, valvular click heard on exam  Pulm: clear to auscultation bilaterally, no wheezes, rhonchi or rales Skin: dry, intact, no rashes or lesions Neuro: Strength and sensation grossly intact in upper and lower extremities    Assessment/Plan: See problem based a/p  ESRD (end stage renal disease) on dialysis [facility]) Currently with a dialysis catheter  Plans for femoral fistula as other sites are no longer available   TIA (transient ischemic attack) No issues with elevated blood pressure, normal CT/MRI  Currently on plavix  Loop recorder in place suppose to follow up in 2 weeks to have this interrogated  Follow up with neurology  Physical therapy in place

## 2016-10-12 DIAGNOSIS — D509 Iron deficiency anemia, unspecified: Secondary | ICD-10-CM | POA: Diagnosis not present

## 2016-10-12 DIAGNOSIS — D631 Anemia in chronic kidney disease: Secondary | ICD-10-CM | POA: Diagnosis not present

## 2016-10-12 DIAGNOSIS — N186 End stage renal disease: Secondary | ICD-10-CM | POA: Diagnosis not present

## 2016-10-12 DIAGNOSIS — N2581 Secondary hyperparathyroidism of renal origin: Secondary | ICD-10-CM | POA: Diagnosis not present

## 2016-10-12 DIAGNOSIS — Z23 Encounter for immunization: Secondary | ICD-10-CM | POA: Diagnosis not present

## 2016-10-15 DIAGNOSIS — Z23 Encounter for immunization: Secondary | ICD-10-CM | POA: Diagnosis not present

## 2016-10-15 DIAGNOSIS — D509 Iron deficiency anemia, unspecified: Secondary | ICD-10-CM | POA: Diagnosis not present

## 2016-10-15 DIAGNOSIS — N186 End stage renal disease: Secondary | ICD-10-CM | POA: Diagnosis not present

## 2016-10-15 DIAGNOSIS — D631 Anemia in chronic kidney disease: Secondary | ICD-10-CM | POA: Diagnosis not present

## 2016-10-15 DIAGNOSIS — I669 Occlusion and stenosis of unspecified cerebral artery: Secondary | ICD-10-CM | POA: Insufficient documentation

## 2016-10-15 DIAGNOSIS — N2581 Secondary hyperparathyroidism of renal origin: Secondary | ICD-10-CM | POA: Diagnosis not present

## 2016-10-15 NOTE — Assessment & Plan Note (Signed)
Currently with a dialysis catheter  Plans for femoral fistula as other sites are no longer available

## 2016-10-15 NOTE — Assessment & Plan Note (Signed)
No issues with elevated blood pressure, normal CT/MRI  Currently on plavix  Loop recorder in place suppose to follow up in 2 weeks to have this interrogated  Follow up with neurology  Physical therapy in place

## 2016-10-16 ENCOUNTER — Ambulatory Visit (INDEPENDENT_AMBULATORY_CARE_PROVIDER_SITE_OTHER): Payer: Medicare Other

## 2016-10-16 ENCOUNTER — Telehealth: Payer: Self-pay | Admitting: Gynecology

## 2016-10-16 ENCOUNTER — Encounter: Payer: Self-pay | Admitting: Gynecology

## 2016-10-16 ENCOUNTER — Telehealth: Payer: Self-pay | Admitting: *Deleted

## 2016-10-16 ENCOUNTER — Other Ambulatory Visit: Payer: Self-pay | Admitting: Gynecology

## 2016-10-16 ENCOUNTER — Encounter: Payer: Self-pay | Admitting: *Deleted

## 2016-10-16 DIAGNOSIS — M810 Age-related osteoporosis without current pathological fracture: Secondary | ICD-10-CM

## 2016-10-16 DIAGNOSIS — Z780 Asymptomatic menopausal state: Secondary | ICD-10-CM | POA: Diagnosis not present

## 2016-10-16 DIAGNOSIS — M818 Other osteoporosis without current pathological fracture: Secondary | ICD-10-CM

## 2016-10-16 DIAGNOSIS — I120 Hypertensive chronic kidney disease with stage 5 chronic kidney disease or end stage renal disease: Secondary | ICD-10-CM | POA: Diagnosis not present

## 2016-10-16 DIAGNOSIS — I272 Pulmonary hypertension, unspecified: Secondary | ICD-10-CM | POA: Diagnosis not present

## 2016-10-16 DIAGNOSIS — M8000XA Age-related osteoporosis with current pathological fracture, unspecified site, initial encounter for fracture: Secondary | ICD-10-CM

## 2016-10-16 DIAGNOSIS — N186 End stage renal disease: Secondary | ICD-10-CM | POA: Diagnosis not present

## 2016-10-16 DIAGNOSIS — Z8673 Personal history of transient ischemic attack (TIA), and cerebral infarction without residual deficits: Secondary | ICD-10-CM | POA: Diagnosis not present

## 2016-10-16 DIAGNOSIS — D631 Anemia in chronic kidney disease: Secondary | ICD-10-CM | POA: Diagnosis not present

## 2016-10-16 DIAGNOSIS — M109 Gout, unspecified: Secondary | ICD-10-CM | POA: Diagnosis not present

## 2016-10-16 NOTE — Telephone Encounter (Signed)
Please provide verbal orders for OT. Thanks asiyah

## 2016-10-16 NOTE — Telephone Encounter (Signed)
Verbal orders given  

## 2016-10-16 NOTE — Telephone Encounter (Signed)
Tell patient her most recent bone density shows osteoporosis. I would recommend an appointment with Dr. Cruzita Lederer who is an endocrinologist who follows complex patients.

## 2016-10-16 NOTE — Telephone Encounter (Signed)
[name] Marks, OT with [facility] called requesting additional orders for occupational therapy. Please give her a call at [phone].  Derl Barrow, RN

## 2016-10-17 ENCOUNTER — Ambulatory Visit (INDEPENDENT_AMBULATORY_CARE_PROVIDER_SITE_OTHER): Payer: Self-pay | Admitting: *Deleted

## 2016-10-17 DIAGNOSIS — D631 Anemia in chronic kidney disease: Secondary | ICD-10-CM | POA: Diagnosis not present

## 2016-10-17 DIAGNOSIS — N186 End stage renal disease: Secondary | ICD-10-CM | POA: Diagnosis not present

## 2016-10-17 DIAGNOSIS — N2581 Secondary hyperparathyroidism of renal origin: Secondary | ICD-10-CM | POA: Diagnosis not present

## 2016-10-17 DIAGNOSIS — I639 Cerebral infarction, unspecified: Secondary | ICD-10-CM

## 2016-10-17 DIAGNOSIS — D509 Iron deficiency anemia, unspecified: Secondary | ICD-10-CM | POA: Diagnosis not present

## 2016-10-17 DIAGNOSIS — Z4509 Encounter for adjustment and management of other cardiac device: Secondary | ICD-10-CM

## 2016-10-17 DIAGNOSIS — Z23 Encounter for immunization: Secondary | ICD-10-CM | POA: Diagnosis not present

## 2016-10-17 LAB — CUP PACEART INCLINIC DEVICE CHECK
MDC IDC PG IMPLANT DT: 20180806
MDC IDC SESS DTM: 20180822160224

## 2016-10-17 NOTE — Progress Notes (Signed)
Loop wound check in clinic. Steri-strips removed. Incision edges approximated without redness, swelling or drainage. Battery status: good. R-waves 0.43mV. No episodes. Patient educated about wound care and Carelink monitoring. Monthly summary reports and ROV with WC PRN.

## 2016-10-17 NOTE — Telephone Encounter (Signed)
Pt aware, referral placed at [place] office.

## 2016-10-18 DIAGNOSIS — D631 Anemia in chronic kidney disease: Secondary | ICD-10-CM | POA: Diagnosis not present

## 2016-10-18 DIAGNOSIS — I120 Hypertensive chronic kidney disease with stage 5 chronic kidney disease or end stage renal disease: Secondary | ICD-10-CM | POA: Diagnosis not present

## 2016-10-18 DIAGNOSIS — N186 End stage renal disease: Secondary | ICD-10-CM | POA: Diagnosis not present

## 2016-10-18 DIAGNOSIS — I272 Pulmonary hypertension, unspecified: Secondary | ICD-10-CM | POA: Diagnosis not present

## 2016-10-18 DIAGNOSIS — Z8673 Personal history of transient ischemic attack (TIA), and cerebral infarction without residual deficits: Secondary | ICD-10-CM | POA: Diagnosis not present

## 2016-10-18 DIAGNOSIS — M109 Gout, unspecified: Secondary | ICD-10-CM | POA: Diagnosis not present

## 2016-10-19 DIAGNOSIS — D509 Iron deficiency anemia, unspecified: Secondary | ICD-10-CM | POA: Diagnosis not present

## 2016-10-19 DIAGNOSIS — N2581 Secondary hyperparathyroidism of renal origin: Secondary | ICD-10-CM | POA: Diagnosis not present

## 2016-10-19 DIAGNOSIS — Z23 Encounter for immunization: Secondary | ICD-10-CM | POA: Diagnosis not present

## 2016-10-19 DIAGNOSIS — D631 Anemia in chronic kidney disease: Secondary | ICD-10-CM | POA: Diagnosis not present

## 2016-10-19 DIAGNOSIS — N186 End stage renal disease: Secondary | ICD-10-CM | POA: Diagnosis not present

## 2016-10-19 NOTE — Telephone Encounter (Signed)
Appointment on 12/11/16 @ 1:15pm

## 2016-10-22 DIAGNOSIS — N2581 Secondary hyperparathyroidism of renal origin: Secondary | ICD-10-CM | POA: Diagnosis not present

## 2016-10-22 DIAGNOSIS — N186 End stage renal disease: Secondary | ICD-10-CM | POA: Diagnosis not present

## 2016-10-22 DIAGNOSIS — Z23 Encounter for immunization: Secondary | ICD-10-CM | POA: Diagnosis not present

## 2016-10-22 DIAGNOSIS — D509 Iron deficiency anemia, unspecified: Secondary | ICD-10-CM | POA: Diagnosis not present

## 2016-10-22 DIAGNOSIS — D631 Anemia in chronic kidney disease: Secondary | ICD-10-CM | POA: Diagnosis not present

## 2016-10-23 DIAGNOSIS — Z8673 Personal history of transient ischemic attack (TIA), and cerebral infarction without residual deficits: Secondary | ICD-10-CM | POA: Diagnosis not present

## 2016-10-23 DIAGNOSIS — D631 Anemia in chronic kidney disease: Secondary | ICD-10-CM | POA: Diagnosis not present

## 2016-10-23 DIAGNOSIS — M109 Gout, unspecified: Secondary | ICD-10-CM | POA: Diagnosis not present

## 2016-10-23 DIAGNOSIS — I272 Pulmonary hypertension, unspecified: Secondary | ICD-10-CM | POA: Diagnosis not present

## 2016-10-23 DIAGNOSIS — N186 End stage renal disease: Secondary | ICD-10-CM | POA: Diagnosis not present

## 2016-10-23 DIAGNOSIS — I120 Hypertensive chronic kidney disease with stage 5 chronic kidney disease or end stage renal disease: Secondary | ICD-10-CM | POA: Diagnosis not present

## 2016-10-24 DIAGNOSIS — Z23 Encounter for immunization: Secondary | ICD-10-CM | POA: Diagnosis not present

## 2016-10-24 DIAGNOSIS — N186 End stage renal disease: Secondary | ICD-10-CM | POA: Diagnosis not present

## 2016-10-24 DIAGNOSIS — D509 Iron deficiency anemia, unspecified: Secondary | ICD-10-CM | POA: Diagnosis not present

## 2016-10-24 DIAGNOSIS — N2581 Secondary hyperparathyroidism of renal origin: Secondary | ICD-10-CM | POA: Diagnosis not present

## 2016-10-24 DIAGNOSIS — D631 Anemia in chronic kidney disease: Secondary | ICD-10-CM | POA: Diagnosis not present

## 2016-10-25 DIAGNOSIS — I120 Hypertensive chronic kidney disease with stage 5 chronic kidney disease or end stage renal disease: Secondary | ICD-10-CM | POA: Diagnosis not present

## 2016-10-25 DIAGNOSIS — N186 End stage renal disease: Secondary | ICD-10-CM | POA: Diagnosis not present

## 2016-10-25 DIAGNOSIS — M109 Gout, unspecified: Secondary | ICD-10-CM | POA: Diagnosis not present

## 2016-10-25 DIAGNOSIS — D631 Anemia in chronic kidney disease: Secondary | ICD-10-CM | POA: Diagnosis not present

## 2016-10-25 DIAGNOSIS — I272 Pulmonary hypertension, unspecified: Secondary | ICD-10-CM | POA: Diagnosis not present

## 2016-10-25 DIAGNOSIS — Z8673 Personal history of transient ischemic attack (TIA), and cerebral infarction without residual deficits: Secondary | ICD-10-CM | POA: Diagnosis not present

## 2016-10-26 DIAGNOSIS — D509 Iron deficiency anemia, unspecified: Secondary | ICD-10-CM | POA: Diagnosis not present

## 2016-10-26 DIAGNOSIS — D631 Anemia in chronic kidney disease: Secondary | ICD-10-CM | POA: Diagnosis not present

## 2016-10-26 DIAGNOSIS — Z992 Dependence on renal dialysis: Secondary | ICD-10-CM | POA: Diagnosis not present

## 2016-10-26 DIAGNOSIS — N186 End stage renal disease: Secondary | ICD-10-CM | POA: Diagnosis not present

## 2016-10-26 DIAGNOSIS — Z23 Encounter for immunization: Secondary | ICD-10-CM | POA: Diagnosis not present

## 2016-10-26 DIAGNOSIS — N2581 Secondary hyperparathyroidism of renal origin: Secondary | ICD-10-CM | POA: Diagnosis not present

## 2016-10-26 DIAGNOSIS — N039 Chronic nephritic syndrome with unspecified morphologic changes: Secondary | ICD-10-CM | POA: Diagnosis not present

## 2016-10-27 DIAGNOSIS — N186 End stage renal disease: Secondary | ICD-10-CM | POA: Diagnosis not present

## 2016-10-27 DIAGNOSIS — K8020 Calculus of gallbladder without cholecystitis without obstruction: Secondary | ICD-10-CM | POA: Insufficient documentation

## 2016-10-27 DIAGNOSIS — Z8673 Personal history of transient ischemic attack (TIA), and cerebral infarction without residual deficits: Secondary | ICD-10-CM | POA: Diagnosis not present

## 2016-10-27 DIAGNOSIS — Z992 Dependence on renal dialysis: Secondary | ICD-10-CM | POA: Diagnosis not present

## 2016-10-27 DIAGNOSIS — Z79899 Other long term (current) drug therapy: Secondary | ICD-10-CM | POA: Diagnosis not present

## 2016-10-27 DIAGNOSIS — R1011 Right upper quadrant pain: Secondary | ICD-10-CM | POA: Diagnosis not present

## 2016-10-27 DIAGNOSIS — Z7902 Long term (current) use of antithrombotics/antiplatelets: Secondary | ICD-10-CM | POA: Insufficient documentation

## 2016-10-27 NOTE — ED Triage Notes (Signed)
Pt arrived POV with c/o mid abd pain with 2 episodes of emesis and diarrhea today. Pt states pain now radiating to R flank.  Pt is HD M/W/F, full treatment yesterday. permacath L chest. Pt does not make urine.

## 2016-10-28 ENCOUNTER — Emergency Department (HOSPITAL_COMMUNITY): Payer: Medicare Other

## 2016-10-28 ENCOUNTER — Encounter (HOSPITAL_COMMUNITY): Payer: Self-pay | Admitting: Emergency Medicine

## 2016-10-28 ENCOUNTER — Emergency Department (HOSPITAL_COMMUNITY)
Admission: EM | Admit: 2016-10-28 | Discharge: 2016-10-28 | Disposition: A | Discharge status: Home / Self Care | Payer: Medicare Other | Attending: Emergency Medicine | Admitting: Emergency Medicine

## 2016-10-28 DIAGNOSIS — R1011 Right upper quadrant pain: Secondary | ICD-10-CM

## 2016-10-28 DIAGNOSIS — K8020 Calculus of gallbladder without cholecystitis without obstruction: Secondary | ICD-10-CM | POA: Diagnosis not present

## 2016-10-28 LAB — COMPREHENSIVE METABOLIC PANEL
ALK PHOS: 323 U/L — AB (ref 38–126)
ALT: 122 U/L — AB (ref 14–54)
AST: 439 U/L — ABNORMAL HIGH (ref 15–41)
Albumin: 3.4 g/dL — ABNORMAL LOW (ref 3.5–5.0)
Anion gap: 10 (ref 5–15)
BUN: 17 mg/dL (ref 6–20)
CALCIUM: 9.4 mg/dL (ref 8.9–10.3)
CO2: 27 mmol/L (ref 22–32)
CREATININE: 6.14 mg/dL — AB (ref 0.44–1.00)
Chloride: 102 mmol/L (ref 101–111)
GFR calc Af Amer: 8 mL/min — ABNORMAL LOW (ref >60)
GFR calc non Af Amer: 7 mL/min — ABNORMAL LOW (ref >60)
GLUCOSE: 113 mg/dL — AB (ref 65–99)
Potassium: 4.4 mmol/L (ref 3.5–5.1)
SODIUM: 139 mmol/L (ref 135–145)
Total Bilirubin: 1.1 mg/dL (ref 0.3–1.2)
Total Protein: 7 g/dL (ref 6.5–8.1)

## 2016-10-28 LAB — CBC WITH DIFFERENTIAL/PLATELET
BASOS PCT: 0 %
Basophils Absolute: 0 10*3/uL (ref 0.0–0.1)
EOS ABS: 0.1 10*3/uL (ref 0.0–0.7)
EOS PCT: 2 %
HCT: 36.6 % (ref 36.0–46.0)
Hemoglobin: 12.1 g/dL (ref 12.0–15.0)
Lymphocytes Relative: 17 %
Lymphs Abs: 1.1 10*3/uL (ref 0.7–4.0)
MCH: 26.8 pg (ref 26.0–34.0)
MCHC: 33.1 g/dL (ref 30.0–36.0)
MCV: 81.2 fL (ref 78.0–100.0)
MONO ABS: 0.4 10*3/uL (ref 0.1–1.0)
MONOS PCT: 6 %
Neutro Abs: 5.1 10*3/uL (ref 1.7–7.7)
Neutrophils Relative %: 75 %
Platelets: 193 10*3/uL (ref 150–400)
RBC: 4.51 MIL/uL (ref 3.87–5.11)
RDW: 18.1 % — AB (ref 11.5–15.5)
WBC: 6.7 10*3/uL (ref 4.0–10.5)

## 2016-10-28 LAB — HEPATIC FUNCTION PANEL
ALBUMIN: 3.4 g/dL — AB (ref 3.5–5.0)
ALBUMIN: 2.9 g/dL — AB (ref 3.5–5.0)
ALK PHOS: 311 U/L — AB (ref 38–126)
ALT: 123 U/L — ABNORMAL HIGH (ref 14–54)
ALT: 213 U/L — AB (ref 14–54)
AST: 452 U/L — ABNORMAL HIGH (ref 15–41)
AST: 597 U/L — ABNORMAL HIGH (ref 15–41)
Alkaline Phosphatase: 319 U/L — ABNORMAL HIGH (ref 38–126)
BILIRUBIN TOTAL: 1.2 mg/dL (ref 0.3–1.2)
Bilirubin, Direct: 0.6 mg/dL — ABNORMAL HIGH (ref 0.1–0.5)
Bilirubin, Direct: 0.8 mg/dL — ABNORMAL HIGH (ref 0.1–0.5)
Indirect Bilirubin: 0.6 mg/dL (ref 0.3–0.9)
Indirect Bilirubin: 0.9 mg/dL (ref 0.3–0.9)
TOTAL PROTEIN: 7.2 g/dL (ref 6.5–8.1)
TOTAL PROTEIN: 6.2 g/dL — AB (ref 6.5–8.1)
Total Bilirubin: 1.7 mg/dL — ABNORMAL HIGH (ref 0.3–1.2)

## 2016-10-28 LAB — LIPASE, BLOOD: Lipase: 37 U/L (ref 11–51)

## 2016-10-28 MED ORDER — ONDANSETRON HCL 4 MG/2ML IJ SOLN
4.0000 mg | Freq: Once | INTRAMUSCULAR | Status: AC
  Administered 2016-10-28: 4 mg via INTRAVENOUS
  Filled 2016-10-28: qty 2

## 2016-10-28 MED ORDER — OXYCODONE HCL 5 MG PO TABS: 2.5000 mg | ORAL_TABLET | Freq: Four times a day (QID) | ORAL | 0 refills | Status: DC | PRN

## 2016-10-28 MED ORDER — FENTANYL CITRATE (PF) 100 MCG/2ML IJ SOLN
50.0000 ug | Freq: Once | INTRAMUSCULAR | Status: AC
  Administered 2016-10-28: 50 ug via INTRAVENOUS
  Filled 2016-10-28: qty 2

## 2016-10-28 MED ORDER — ONDANSETRON 4 MG PO TBDP: 4.0000 mg | ORAL_TABLET | Freq: Three times a day (TID) | ORAL | 0 refills | Status: AC | PRN

## 2016-10-28 MED ORDER — OXYCODONE HCL 5 MG PO TABS
2.5000 mg | ORAL_TABLET | Freq: Once | ORAL | Status: AC
  Administered 2016-10-28: 2.5 mg via ORAL
  Filled 2016-10-28: qty 1

## 2016-10-28 NOTE — ED Notes (Signed)
Pt taken out in wheelchair discharged with son, belongings on person.

## 2016-10-28 NOTE — ED Notes (Signed)
ED Provider at bedside. 

## 2016-10-28 NOTE — ED Provider Notes (Signed)
[facility] DEPT Provider Note   CSN: [id number] Arrival date & time: 10/27/16  2346     History   Chief Complaint Chief Complaint  Patient presents with  . Abdominal Pain    HPI [name] Marks is a 61 y.o. female.  HPI 61 year old female with extensive past medical history listed below presents to the emergency department with epigastric and right upper quadrant abdominal pain for the last couple days which worsened in the last 12-18 hours. Pain is exacerbated with palpation of the right upper quadrant. Due to the nausea patient has had minimal oral intake and is uncertain if oral intake that worsens her pain. No other alleviating or aggravating factors. Associated with nausea and nonbloody nonbilious vomiting. She denies any fevers, chills, chest pain, shortness of breath. No urinary symptoms. No suspicious food intake or recent travels. Denies any recent antibiotic use.  Past Medical History:  Diagnosis Date  . Complication of anesthesia    due to kidney disease  . End stage renal disease on dialysis ([place])   . GERD (gastroesophageal reflux disease)   . Gout   . Osteoporosis 09/2016   T score -2.6  . Septic shock ([place])   . Staphylococcus aureus bacteremia     Patient Active Problem List   Diagnosis Date Noted  . Cerebral embolism with transient ischemic attack (TIA)   . TIA (transient ischemic attack)   . AVD (aortic valve disease) 07/12/2016  . Pulmonary HTN ([place]) 07/12/2016  . Carpal tunnel syndrome 07/05/2016  . 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 CVA (cerebral vascular accident) ([place]) 05/16/2016  . History of aortic valve replacement with bioprosthetic valve 05/08/2016  . Aortic valve prosthesis present 02/25/2016  . Bacteremia due to coagulase-negative Staphylococcus   . S/P dialysis catheter insertion ([place])   . Bilateral low back pain with sciatica   . Neck pain   . Endocarditis   . Anemia   . Renal dialysis device, implant, or graft complication  35/57/3220  . Pain and swelling of right upper extremity 12/20/2015  . Complex endometrial hyperplasia with atypia 11/08/2015  . Increased endometrial stripe thickness 11/03/2015  . Obesity 10/28/2014  . Shoulder pain, bilateral 10/28/2014  . Health care maintenance 10/28/2014  . Right carotid bruit 01/08/2013  . Generalized headaches 01/08/2013  . Chronic female pelvic pain 08/08/2012  . Gout, unspecified 12/06/2008  . Esophageal reflux 12/06/2008  . BACK PAIN 12/06/2008  . FIBROIDS, UTERUS 11/24/2008  . ESRD (end stage renal disease) on dialysis ([place]) 07/01/2006    Past Surgical History:  Procedure Laterality Date  . AORTIC VALVE REPLACEMENT N/A 02/25/2016   Procedure: AORTIC VALVE REPLACEMENT (AVR) implanted with Magna Ease Aortic valve size 32mm;  Surgeon: Melrose Nakayama, MD;  Location: [facility];  Service: Open Heart Surgery;  Laterality: N/A;  . [place] Right 02/17/2016   Procedure: REMOVAL OF TWO ARTERIOVENOUS GORETEX GRAFTS ([place]);  Surgeon: Angelia Mould, MD;  Location: [facility];  Service: Vascular;  Laterality: Right;  . BREAST BIOPSY     stereo left 2013  . BREAST BIOPSY     stereo right 2011  . DG AV DIALYSIS GRAFT DECLOT OR    . DILATATION & CURETTAGE/HYSTEROSCOPY WITH TRUECLEAR N/A 11/06/2012   Procedure: DILATATION & CURETTAGE/HYSTEROSCOPY WITH TRUECLEAR;  Surgeon: Terrance Mass, MD;  Location: [facility];  Service: Gynecology;  Laterality: N/A;  Truclear Resectoscopic Polypectomy   . INSERTION OF DIALYSIS CATHETER N/A 02/19/2016   Procedure: INSERTION OF Left Internal Jugular DIALYSIS  CATHETER;  Surgeon: Angelia Mould, MD;  Location: [facility];  Service: Vascular;  Laterality: N/A;  . LOOP RECORDER INSERTION N/A 10/01/2016   Procedure: LOOP RECORDER INSERTION;  Surgeon: Constance Haw, MD;  Location: [facility];  Service: Cardiovascular;  Laterality: N/A;  . PATCH ANGIOPLASTY Right 02/17/2016   Procedure: PATCH ANGIOPLASTY;  Surgeon:  Angelia Mould, MD;  Location: [facility];  Service: Vascular;  Laterality: Right;  . PERIPHERAL VASCULAR CATHETERIZATION N/A 09/14/2014   Procedure: A/V Shuntogram/Fistulagram;  Surgeon: Katha Cabal, MD;  Location: [facility];  Service: Cardiovascular;  Laterality: N/A;  . PERIPHERAL VASCULAR CATHETERIZATION N/A 09/14/2014   Procedure: A/V Shunt Intervention;  Surgeon: Katha Cabal, MD;  Location: [facility];  Service: Cardiovascular;  Laterality: N/A;  . PERIPHERAL VASCULAR CATHETERIZATION Right 12/09/2014   Procedure: A/V Shuntogram/Fistulagram;  Surgeon: Algernon Huxley, MD;  Location: [facility];  Service: Cardiovascular;  Laterality: Right;  . PERIPHERAL VASCULAR CATHETERIZATION N/A 12/09/2014   Procedure: A/V Shunt Intervention;  Surgeon: Algernon Huxley, MD;  Location: [facility];  Service: Cardiovascular;  Laterality: N/A;  . PERIPHERAL VASCULAR CATHETERIZATION Right 05/24/2015   Procedure: A/V Shuntogram;  Surgeon: Serafina Mitchell, MD;  Location: [facility];  Service: Cardiovascular;  Laterality: Right;  . PERIPHERAL VASCULAR CATHETERIZATION Right 05/24/2015   Procedure: Peripheral Vascular Balloon Angioplasty;  Surgeon: Serafina Mitchell, MD;  Location: [facility];  Service: Cardiovascular;  Laterality: Right;  right arm shunt  . PERIPHERAL VASCULAR CATHETERIZATION N/A 06/13/2015   Procedure: A/V Shuntogram/Fistulagram;  Surgeon: Algernon Huxley, MD;  Location: [facility];  Service: Cardiovascular;  Laterality: N/A;  . PERIPHERAL VASCULAR CATHETERIZATION N/A 06/13/2015   Procedure: A/V Shunt Intervention;  Surgeon: Algernon Huxley, MD;  Location: [facility];  Service: Cardiovascular;  Laterality: N/A;  . TEE WITHOUT CARDIOVERSION N/A 02/22/2016   Procedure: TRANSESOPHAGEAL ECHOCARDIOGRAM (TEE);  Surgeon: Pixie Casino, MD;  Location: [facility];  Service: Cardiovascular;  Laterality: N/A;  . TEE WITHOUT CARDIOVERSION  N/A 02/25/2016   Procedure: TRANSESOPHAGEAL ECHOCARDIOGRAM (TEE);  Surgeon: Melrose Nakayama, MD;  Location: [facility];  Service: Open Heart Surgery;  Laterality: N/A;  . TEE WITHOUT CARDIOVERSION N/A 10/01/2016   Procedure: TRANSESOPHAGEAL ECHOCARDIOGRAM (TEE);  Surgeon: Pixie Casino, MD;  Location: [facility] END[facility]COPY;  Service: Cardiovascular;  Laterality: N/A;  . TUBAL LIGATION  1983    OB History    Gravida Para Term Preterm AB Living   2 2       2    SAB TAB Ectopic Multiple Live Births                   Home Medications    Prior to Admission medications   Medication Sig Start Date End Date Taking? Authorizing Provider  cinacalcet (SENSIPAR) 60 MG tablet Take 120 mg by mouth every Monday, Wednesday, and Friday with hemodialysis.    Yes [provider]  clopidogrel (PLAVIX) 75 MG tablet Take 1 tablet (75 mg total) by mouth daily. 10/03/16  Yes Carlyle Dolly, MD  famotidine (PEPCID) 20 MG tablet Take 20 mg by mouth 2 (two) times daily. Lunch and dinner   Yes [provider]  midodrine (PROAMATINE) 10 MG tablet Take 1 tablet (10 mg total) by mouth 2 (two) times daily as needed (30 min prior to HD). 08/27/16  Yes Mikell, Jeani Sow, MD  Multiple Vitamins-Minerals (PRORENAL QD  PO) Take 1 tablet by mouth daily.    Yes [provider]  sevelamer (RENVELA) 800 MG tablet Take 800 mg by mouth 3 (three) times daily with meals.    Yes [provider]  ondansetron (ZOFRAN ODT) 4 MG disintegrating tablet Take 1 tablet (4 mg total) by mouth every 8 (eight) hours as needed for nausea or vomiting. 10/28/16 10/31/16  Fatima Blank, MD  oxyCODONE (ROXICODONE) 5 MG immediate release tablet Take 0.5 tablets (2.5 mg total) by mouth every 6 (six) hours as needed for severe pain. 10/28/16   Fatima Blank, MD    Family History Family History  Problem Relation Age of Onset  . Diabetes Mother   . Hypertension Mother   . Heart disease Mother   . Alcohol  abuse Mother   . Kidney disease Mother   . Cancer Father        COLON  . Alcohol abuse Father   . Breast cancer Sister 35  . Hypertension Sister   . Kidney disease Sister   . Hypertension Son   . Kidney disease Son     Social History Social History  Substance Use Topics  . Smoking status: Never Smoker  . Smokeless tobacco: Never Used  . Alcohol use No     Allergies   Contrast media [iodinated diagnostic agents] and Naproxen sodium   Review of Systems Review of Systems All other systems are reviewed and are negative for acute change except as noted in the HPI   Physical Exam Updated Vital Signs BP (!) 143/84 (BP Location: Right Arm)   Pulse 100   Temp 97.8 F (36.6 C) (Oral)   Resp 16   Ht 5\' 6"  (1.676 m)   Wt 75.8 kg (167 lb)   SpO2 100%   BMI 26.95 kg/m   Physical Exam  Constitutional: She is oriented to person, place, and time. She appears well-developed and well-nourished. No distress.  HENT:  Head: Normocephalic and atraumatic.  Nose: Nose normal.  Eyes: Pupils are equal, round, and reactive to light. Conjunctivae and EOM are normal. Right eye exhibits no discharge. Left eye exhibits no discharge. No scleral icterus.  Neck: Normal range of motion. Neck supple.  Cardiovascular: Normal rate and regular rhythm.  Exam reveals no gallop and no friction rub.   No murmur heard. Pulmonary/Chest: Effort normal and breath sounds normal. No stridor. No respiratory distress. She has no rales.  Abdominal: Soft. She exhibits no distension. There is tenderness in the right upper quadrant and epigastric area. There is no rigidity, no rebound, no guarding, no CVA tenderness and negative Murphy's sign.  Musculoskeletal: She exhibits no edema or tenderness.  Neurological: She is alert and oriented to person, place, and time.  Skin: Skin is warm and dry. No rash noted. She is not diaphoretic. No erythema.  Psychiatric: She has a normal mood and affect.  Vitals  reviewed.    ED Treatments / Results  Labs (all labs ordered are listed, but only abnormal results are displayed) Labs Reviewed  COMPREHENSIVE METABOLIC PANEL - Abnormal; Notable for the following:       Result Value   Glucose, Bld 113 (*)    Creatinine, Ser 6.14 (*)    Albumin 3.4 (*)    AST 439 (*)    ALT 122 (*)    Alkaline Phosphatase 323 (*)    GFR calc non Af Amer 7 (*)    GFR calc Af Amer 8 (*)    All  other components within normal limits  CBC WITH DIFFERENTIAL/PLATELET - Abnormal; Notable for the following:    RDW 18.1 (*)    All other components within normal limits  HEPATIC FUNCTION PANEL - Abnormal; Notable for the following:    Albumin 3.4 (*)    AST 452 (*)    ALT 123 (*)    Alkaline Phosphatase 319 (*)    Bilirubin, Direct 0.6 (*)    All other components within normal limits  HEPATIC FUNCTION PANEL - Abnormal; Notable for the following:    Total Protein 6.2 (*)    Albumin 2.9 (*)    AST 597 (*)    ALT 213 (*)    Alkaline Phosphatase 311 (*)    Total Bilirubin 1.7 (*)    Bilirubin, Direct 0.8 (*)    All other components within normal limits  LIPASE, BLOOD    EKG  EKG Interpretation  Date/Time:  Sunday October 28 2016 00:58:27 EDT Ventricular Rate:  79 PR Interval:    QRS Duration: 89 QT Interval:  384 QTC Calculation: 441 R Axis:   69 Text Interpretation:  Sinus rhythm Borderline repolarization abnormality Baseline wander in lead(s) II aVR No significant change since last tracing Confirmed by Addison Lank [phone]) on 10/28/2016 3:01:39 AM       Radiology US Abdomen Limited Ruq  Result Date: 10/28/2016 CLINICAL DATA:  Right upper quadrant pain for 1 day. Nausea and vomiting. EXAM: ULTRASOUND ABDOMEN LIMITED RIGHT UPPER QUADRANT COMPARISON:  CT 12/21/2015 FINDINGS: Gallbladder: Multiple calculi, measuring up to 1.8 cm. No gallbladder wall thickening or pericholecystic fluid. The patient was not tender to probe pressure over the gallbladder. Common  bile duct: Diameter: 6.1 mm Liver: Trace perihepatic fluid. No focal liver lesion. Within normal limits in parenchymal echogenicity. Portal vein is patent on color Doppler imaging with normal direction of blood flow towards the liver. IMPRESSION: Cholelithiasis. Trace perihepatic fluid. No sonographic evidence of acute cholecystitis. Electronically Signed   By: Andreas Newport M.D.   On: 10/28/2016 02:53    Procedures Procedures (including critical care time)  Medications Ordered in ED Medications  fentaNYL (SUBLIMAZE) injection 50 mcg (50 mcg Intravenous Given 10/28/16 0100)  ondansetron (ZOFRAN) injection 4 mg (4 mg Intravenous Given 10/28/16 0100)  fentaNYL (SUBLIMAZE) injection 50 mcg (50 mcg Intravenous Given 10/28/16 0400)  oxyCODONE (Oxy IR/ROXICODONE) immediate release tablet 2.5 mg (2.5 mg Oral Given 10/28/16 0401)     Initial Impression / Assessment and Plan / ED Course  I have reviewed the triage vital signs and the nursing notes.  Pertinent labs & imaging results that were available during my care of the patient were reviewed by me and considered in my medical decision making (see chart for details).     Patient noted to have cholelithiasis without evidence of acute cholecystitis or choledocholithiasis. Appears the patient likely passed a gallstone. She does have elevated LFTs, however patient's pain has been under control. She is able to tolerate oral intake. Do not feel that emergent surgical or GI consultation is required at this time. Patient was monitored for several hours and reexamination of the abdomen was benign. No evidence to suggest ascending cholangitis, pancreatitis. Low suspicion for other serious intra-abdominal inflammatory/infectious process.  The patient is safe for discharge with strict return precautions.   Final Clinical Impressions(s) / ED Diagnoses   Final diagnoses:  RUQ pain  Calculus of gallbladder without cholecystitis without obstruction    Disposition: Discharge  Condition: Good  I have discussed the results,  Dx and Tx plan with the patient who expressed understanding and agree(s) with the plan. Discharge instructions discussed at great length. The patient was given strict return precautions who verbalized understanding of the instructions. No further questions at time of discharge.    Discharge Medication List as of 10/28/2016  7:13 AM    START taking these medications   Details  ondansetron (ZOFRAN ODT) 4 MG disintegrating tablet Take 1 tablet (4 mg total) by mouth every 8 (eight) hours as needed for nausea or vomiting., Starting Sun 10/28/2016, Until Wed 10/31/2016, Print    oxyCODONE (ROXICODONE) 5 MG immediate release tablet Take 0.5 tablets (2.5 mg total) by mouth every 6 (six) hours as needed for severe pain., Starting Sun 10/28/2016, Print        Follow Up: Kinsinger, Arta [address], [place] [phone]  Schedule an appointment as soon as possible for a visit  if you continue to have recurrent pain from your gallbladder  Primary care provider  Schedule an appointment as soon as possible for a visit  in 5-7 days, If symptoms do not improve or  worsen      Blinda Turek, Grayce Sessions, MD 10/28/16 [phone]

## 2016-10-28 NOTE — ED Notes (Signed)
Patient transported to Ultrasound 

## 2016-10-29 ENCOUNTER — Other Ambulatory Visit: Payer: Self-pay | Admitting: Family Medicine

## 2016-10-29 DIAGNOSIS — N186 End stage renal disease: Secondary | ICD-10-CM | POA: Diagnosis not present

## 2016-10-29 DIAGNOSIS — N2581 Secondary hyperparathyroidism of renal origin: Secondary | ICD-10-CM | POA: Diagnosis not present

## 2016-10-29 DIAGNOSIS — D631 Anemia in chronic kidney disease: Secondary | ICD-10-CM | POA: Diagnosis not present

## 2016-10-29 DIAGNOSIS — D509 Iron deficiency anemia, unspecified: Secondary | ICD-10-CM | POA: Diagnosis not present

## 2016-10-30 ENCOUNTER — Telehealth: Payer: Self-pay | Admitting: *Deleted

## 2016-10-30 DIAGNOSIS — I120 Hypertensive chronic kidney disease with stage 5 chronic kidney disease or end stage renal disease: Secondary | ICD-10-CM | POA: Diagnosis not present

## 2016-10-30 DIAGNOSIS — I272 Pulmonary hypertension, unspecified: Secondary | ICD-10-CM | POA: Diagnosis not present

## 2016-10-30 DIAGNOSIS — Z8673 Personal history of transient ischemic attack (TIA), and cerebral infarction without residual deficits: Secondary | ICD-10-CM | POA: Diagnosis not present

## 2016-10-30 DIAGNOSIS — D631 Anemia in chronic kidney disease: Secondary | ICD-10-CM | POA: Diagnosis not present

## 2016-10-30 DIAGNOSIS — N186 End stage renal disease: Secondary | ICD-10-CM | POA: Diagnosis not present

## 2016-10-30 DIAGNOSIS — M109 Gout, unspecified: Secondary | ICD-10-CM | POA: Diagnosis not present

## 2016-10-30 NOTE — Telephone Encounter (Signed)
[name] Marks with [facility] states patient has achieved goals of care early and will be discharged as of today. FYI to MD.

## 2016-10-31 ENCOUNTER — Ambulatory Visit (INDEPENDENT_AMBULATORY_CARE_PROVIDER_SITE_OTHER): Payer: Medicare Other | Admitting: *Deleted

## 2016-10-31 DIAGNOSIS — N186 End stage renal disease: Secondary | ICD-10-CM | POA: Diagnosis not present

## 2016-10-31 DIAGNOSIS — I639 Cerebral infarction, unspecified: Secondary | ICD-10-CM | POA: Diagnosis not present

## 2016-10-31 DIAGNOSIS — D631 Anemia in chronic kidney disease: Secondary | ICD-10-CM | POA: Diagnosis not present

## 2016-10-31 DIAGNOSIS — N2581 Secondary hyperparathyroidism of renal origin: Secondary | ICD-10-CM | POA: Diagnosis not present

## 2016-10-31 DIAGNOSIS — D509 Iron deficiency anemia, unspecified: Secondary | ICD-10-CM | POA: Diagnosis not present

## 2016-11-01 DIAGNOSIS — I272 Pulmonary hypertension, unspecified: Secondary | ICD-10-CM | POA: Diagnosis not present

## 2016-11-01 DIAGNOSIS — N186 End stage renal disease: Secondary | ICD-10-CM | POA: Diagnosis not present

## 2016-11-01 DIAGNOSIS — M109 Gout, unspecified: Secondary | ICD-10-CM | POA: Diagnosis not present

## 2016-11-01 DIAGNOSIS — D631 Anemia in chronic kidney disease: Secondary | ICD-10-CM | POA: Diagnosis not present

## 2016-11-01 DIAGNOSIS — Z8673 Personal history of transient ischemic attack (TIA), and cerebral infarction without residual deficits: Secondary | ICD-10-CM | POA: Diagnosis not present

## 2016-11-01 DIAGNOSIS — I120 Hypertensive chronic kidney disease with stage 5 chronic kidney disease or end stage renal disease: Secondary | ICD-10-CM | POA: Diagnosis not present

## 2016-11-01 NOTE — Progress Notes (Signed)
Carelink Summary Report / Loop Recorder 

## 2016-11-02 DIAGNOSIS — D509 Iron deficiency anemia, unspecified: Secondary | ICD-10-CM | POA: Diagnosis not present

## 2016-11-02 DIAGNOSIS — N186 End stage renal disease: Secondary | ICD-10-CM | POA: Diagnosis not present

## 2016-11-02 DIAGNOSIS — D631 Anemia in chronic kidney disease: Secondary | ICD-10-CM | POA: Diagnosis not present

## 2016-11-02 DIAGNOSIS — N2581 Secondary hyperparathyroidism of renal origin: Secondary | ICD-10-CM | POA: Diagnosis not present

## 2016-11-05 DIAGNOSIS — D631 Anemia in chronic kidney disease: Secondary | ICD-10-CM | POA: Diagnosis not present

## 2016-11-05 DIAGNOSIS — N2581 Secondary hyperparathyroidism of renal origin: Secondary | ICD-10-CM | POA: Diagnosis not present

## 2016-11-05 DIAGNOSIS — N186 End stage renal disease: Secondary | ICD-10-CM | POA: Diagnosis not present

## 2016-11-05 DIAGNOSIS — D509 Iron deficiency anemia, unspecified: Secondary | ICD-10-CM | POA: Diagnosis not present

## 2016-11-05 LAB — CUP PACEART REMOTE DEVICE CHECK
Implantable Pulse Generator Implant Date: 20180806
MDC IDC SESS DTM: 20180905163919

## 2016-11-06 ENCOUNTER — Ambulatory Visit (INDEPENDENT_AMBULATORY_CARE_PROVIDER_SITE_OTHER): Payer: Medicare Other | Admitting: Internal Medicine

## 2016-11-06 ENCOUNTER — Encounter: Payer: Self-pay | Admitting: Internal Medicine

## 2016-11-06 VITALS — BP 140/70 | HR 72 | Temp 98.3°F | Ht 66.0 in | Wt 168.0 lb

## 2016-11-06 DIAGNOSIS — K8080 Other cholelithiasis without obstruction: Secondary | ICD-10-CM | POA: Diagnosis present

## 2016-11-06 DIAGNOSIS — I639 Cerebral infarction, unspecified: Secondary | ICD-10-CM

## 2016-11-06 NOTE — Patient Instructions (Addendum)
Please follow up as needed. If symptoms return/worsening please contact us or surgery.   Biliary Colic, Adult Biliary colic is severe pain caused by a problem with a small organ in the upper right part of your belly (gallbladder). The gallbladder stores a digestive fluid produced in the liver (bile) that helps the body break down fat. Bile and other digestive enzymes are carried from the liver to the small intestine though tube-like structures (bile ducts). The gallbladder and the bile ducts form the biliary tract. Sometimes hard deposits of digestive fluids form in the gallbladder (gallstones) and block the flow of bile from the gallbladder, causing biliary colic. This condition is also called a gallbladder attack. Gallstones can be as small as a grain of sand or as big as a golf ball. There could be just one gallstone in the gallbladder, or there could be many. What are the causes? Biliary colic is usually caused by gallstones. Less often, a tumor could block the flow of bile from the gallbladder and trigger biliary colic. What increases the risk? This condition is more likely to develop in:  Women.  People of Hispanic descent.  People with a family history of gallstones.  People who are obese.  People who suddenly or quickly lose weight.  People who eat a high-calorie, low-fiber diet that is rich in refined carbs (carbohydrates), such as white bread and white rice.  People who have an intestinal disease that affects nutrient absorption, such as Crohn disease.  People who have a metabolic condition, such as metabolic syndrome or diabetes.  What are the signs or symptoms? Severe pain in the upper right side of the belly is the main symptom of biliary colic. You may feel this pain below the chest but above the hip. This pain often occurs at night or after eating a very fatty meal. This pain may get worse for up to an hour and last as long as 12 hours. In most cases, the pain fades  (subsides) within a couple hours. Other symptoms of this condition include:  Nausea and vomiting.  Pain under the right shoulder.  How is this diagnosed? This condition is diagnosed based on your medical history, your symptoms, and a physical exam. You may have tests, including:  Blood tests to rule out infection or inflammation of the bile ducts, gallbladder, pancreas, or liver.  Imaging studies such as: ? Ultrasound. ? CT scan. ? MRI.  In some cases, you may need to have an imaging study done using a small amount of radioactive material (nuclear medicine) to confirm the diagnosis. How is this treated? Treatment for this condition may include medicine to relieve your pain or nausea. If you have gallstones that are causing biliary colic, you may need surgery to remove the gallbladder (cholecystectomy). Gallstones can also be dissolved gradually with medicine. It may take months or years before the gallstones are completely gone. Follow these instructions at home:  Take over-the-counter and prescription medicines only as told by your health care provider.  Drink enough fluid to keep your urine clear or pale yellow.  Follow instructions from your health care provider about eating or drinking restrictions. These may include avoiding: ? Fatty, greasy, and fried foods. ? Any foods that make the pain worse. ? Overeating. ? Having a large meal after not eating for a while.  Keep all follow-up visits as told by your health care provider. This is important. How is this prevented? Steps to prevent this condition include:  Maintaining a healthy  body weight.  Getting regular exercise.  Eating a healthy, high-fiber, low-fat diet.  Limiting how much sugar and refined carbs you eat, such as sweets, white flour, and white rice.  Contact a health care provider if:  Your pain lasts more than 5 hours.  You vomit.  You have a fever and chills.  Your pain gets worse. Get help right  away if:  Your skin or the whites of your eyes look yellow (jaundice).  Your have tea-colored urine and light-colored stools.  You are dizzy or you faint. This information is not intended to replace advice given to you by your health care provider. Make sure you discuss any questions you have with your health care provider. Document Released: 07/16/2005 Document Revised: 10/11/2015 Document Reviewed: 08/29/2015 Elsevier Interactive Patient Education  [employer].

## 2016-11-06 NOTE — Progress Notes (Signed)
   [name] [name] Mo, MD Phone: [phone]  Reason For Visit: ED follow up for Gallstone calculus   # Epigastric Pain  - Patient presenting following an ED follow-up for biliary colic. Patient underwent annual and epigastric pain that radiated to the right side. No fevers no nausea no vomiting. She was found to have multiple gallstones in her gallbladder removed on ultrasound. She states following this episode lasted about 4-5 hours she stopped having pain. She denies any further episodes. She has been able to eat without issue. She denies any nausea or vomiting any fevers.  Past Medical History Reviewed problem list.  Medications- reviewed and updated No additions to family history Social history- patient is a non-smoker  Objective: BP 140/70   Pulse 72   Temp 98.3 F (36.8 C) (Oral)   Ht 5\' 6"  (1.676 m)   Wt 168 lb (76.2 kg)   SpO2 93%   BMI 27.12 kg/m  Gen: NAD, alert, cooperative with exam Cardio: regular rate and rhythm, S1S2 heard, no murmurs appreciated Pulm: clear to auscultation bilaterally, no wheezes, rhonchi or rales GI: soft, non-tender, non-distended, bowel sounds present, no hepatomegaly, no splenomegaly Skin: dry, intact, no rashes or lesions  Assessment/Plan: See problem based a/p  Cholelithiasis ED follow-up, following an episode of biliary colic. Patient is interested in watchful waiting. Encouraged her to establish care with surgery-she has an appointment in October. -Discussed warning signs for infection of the gallbladder with patient and when she should follow-up. Patient voiced understanding. Will follow-up if needed

## 2016-11-07 DIAGNOSIS — D509 Iron deficiency anemia, unspecified: Secondary | ICD-10-CM | POA: Diagnosis not present

## 2016-11-07 DIAGNOSIS — N2581 Secondary hyperparathyroidism of renal origin: Secondary | ICD-10-CM | POA: Diagnosis not present

## 2016-11-07 DIAGNOSIS — N186 End stage renal disease: Secondary | ICD-10-CM | POA: Diagnosis not present

## 2016-11-07 DIAGNOSIS — K8020 Calculus of gallbladder without cholecystitis without obstruction: Secondary | ICD-10-CM | POA: Insufficient documentation

## 2016-11-07 DIAGNOSIS — D631 Anemia in chronic kidney disease: Secondary | ICD-10-CM | POA: Diagnosis not present

## 2016-11-07 NOTE — Assessment & Plan Note (Addendum)
ED follow-up, following an episode of biliary colic. Patient is interested in watchful waiting. Encouraged her to establish care with surgery-she has an appointment in October. -Discussed warning signs for infection of the gallbladder with patient and when she should follow-up. Patient voiced understanding. Will follow-up if needed

## 2016-11-09 ENCOUNTER — Encounter (HOSPITAL_COMMUNITY): Payer: Self-pay | Admitting: Emergency Medicine

## 2016-11-09 ENCOUNTER — Emergency Department (HOSPITAL_COMMUNITY)
Admission: EM | Admit: 2016-11-09 | Discharge: 2016-11-09 | Disposition: A | Discharge status: Home / Self Care | Payer: Medicare Other | Attending: Emergency Medicine | Admitting: Emergency Medicine

## 2016-11-09 DIAGNOSIS — Z5321 Procedure and treatment not carried out due to patient leaving prior to being seen by health care provider: Secondary | ICD-10-CM | POA: Insufficient documentation

## 2016-11-09 DIAGNOSIS — R1011 Right upper quadrant pain: Secondary | ICD-10-CM | POA: Diagnosis not present

## 2016-11-09 DIAGNOSIS — R1010 Upper abdominal pain, unspecified: Secondary | ICD-10-CM | POA: Diagnosis not present

## 2016-11-09 DIAGNOSIS — D631 Anemia in chronic kidney disease: Secondary | ICD-10-CM | POA: Diagnosis not present

## 2016-11-09 DIAGNOSIS — R9431 Abnormal electrocardiogram [ECG] [EKG]: Secondary | ICD-10-CM | POA: Diagnosis not present

## 2016-11-09 DIAGNOSIS — D509 Iron deficiency anemia, unspecified: Secondary | ICD-10-CM | POA: Diagnosis not present

## 2016-11-09 DIAGNOSIS — N2581 Secondary hyperparathyroidism of renal origin: Secondary | ICD-10-CM | POA: Diagnosis not present

## 2016-11-09 DIAGNOSIS — N186 End stage renal disease: Secondary | ICD-10-CM | POA: Diagnosis not present

## 2016-11-09 LAB — CBC
HEMATOCRIT: 36.9 % (ref 36.0–46.0)
Hemoglobin: 11.6 g/dL — ABNORMAL LOW (ref 12.0–15.0)
MCH: 25.9 pg — ABNORMAL LOW (ref 26.0–34.0)
MCHC: 31.4 g/dL (ref 30.0–36.0)
MCV: 82.4 fL (ref 78.0–100.0)
Platelets: 217 10*3/uL (ref 150–400)
RBC: 4.48 MIL/uL (ref 3.87–5.11)
RDW: 18 % — AB (ref 11.5–15.5)
WBC: 6.5 10*3/uL (ref 4.0–10.5)

## 2016-11-09 LAB — COMPREHENSIVE METABOLIC PANEL
ALBUMIN: 3.5 g/dL (ref 3.5–5.0)
ALT: 35 U/L (ref 14–54)
AST: 120 U/L — AB (ref 15–41)
Alkaline Phosphatase: 268 U/L — ABNORMAL HIGH (ref 38–126)
Anion gap: 12 (ref 5–15)
BUN: <5 mg/dL — ABNORMAL LOW (ref 6–20)
CHLORIDE: 100 mmol/L — AB (ref 101–111)
CO2: 25 mmol/L (ref 22–32)
Calcium: 8.8 mg/dL — ABNORMAL LOW (ref 8.9–10.3)
Creatinine, Ser: 3.19 mg/dL — ABNORMAL HIGH (ref 0.44–1.00)
GFR calc Af Amer: 17 mL/min — ABNORMAL LOW (ref >60)
GFR calc non Af Amer: 15 mL/min — ABNORMAL LOW (ref >60)
GLUCOSE: 87 mg/dL (ref 65–99)
POTASSIUM: 3.8 mmol/L (ref 3.5–5.1)
Sodium: 137 mmol/L (ref 135–145)
Total Bilirubin: 1.3 mg/dL — ABNORMAL HIGH (ref 0.3–1.2)
Total Protein: 6.9 g/dL (ref 6.5–8.1)

## 2016-11-09 LAB — LIPASE, BLOOD: LIPASE: 40 U/L (ref 11–51)

## 2016-11-09 NOTE — ED Triage Notes (Signed)
Pt diagnosed 3 weeks ago with gallstones, given pain meds, states upper mid abdominal pain began again today, reports nausea and diarrhea. Did not take pain meds due to nausea.

## 2016-11-12 DIAGNOSIS — D509 Iron deficiency anemia, unspecified: Secondary | ICD-10-CM | POA: Diagnosis not present

## 2016-11-12 DIAGNOSIS — N186 End stage renal disease: Secondary | ICD-10-CM | POA: Diagnosis not present

## 2016-11-12 DIAGNOSIS — N2581 Secondary hyperparathyroidism of renal origin: Secondary | ICD-10-CM | POA: Diagnosis not present

## 2016-11-12 DIAGNOSIS — D631 Anemia in chronic kidney disease: Secondary | ICD-10-CM | POA: Diagnosis not present

## 2016-11-14 DIAGNOSIS — N186 End stage renal disease: Secondary | ICD-10-CM | POA: Diagnosis not present

## 2016-11-14 DIAGNOSIS — N2581 Secondary hyperparathyroidism of renal origin: Secondary | ICD-10-CM | POA: Diagnosis not present

## 2016-11-14 DIAGNOSIS — D509 Iron deficiency anemia, unspecified: Secondary | ICD-10-CM | POA: Diagnosis not present

## 2016-11-14 DIAGNOSIS — D631 Anemia in chronic kidney disease: Secondary | ICD-10-CM | POA: Diagnosis not present

## 2016-11-16 DIAGNOSIS — N186 End stage renal disease: Secondary | ICD-10-CM | POA: Diagnosis not present

## 2016-11-16 DIAGNOSIS — N2581 Secondary hyperparathyroidism of renal origin: Secondary | ICD-10-CM | POA: Diagnosis not present

## 2016-11-16 DIAGNOSIS — D631 Anemia in chronic kidney disease: Secondary | ICD-10-CM | POA: Diagnosis not present

## 2016-11-16 DIAGNOSIS — D509 Iron deficiency anemia, unspecified: Secondary | ICD-10-CM | POA: Diagnosis not present

## 2016-11-19 DIAGNOSIS — D631 Anemia in chronic kidney disease: Secondary | ICD-10-CM | POA: Diagnosis not present

## 2016-11-19 DIAGNOSIS — D509 Iron deficiency anemia, unspecified: Secondary | ICD-10-CM | POA: Diagnosis not present

## 2016-11-19 DIAGNOSIS — N2581 Secondary hyperparathyroidism of renal origin: Secondary | ICD-10-CM | POA: Diagnosis not present

## 2016-11-19 DIAGNOSIS — N186 End stage renal disease: Secondary | ICD-10-CM | POA: Diagnosis not present

## 2016-11-21 DIAGNOSIS — N2581 Secondary hyperparathyroidism of renal origin: Secondary | ICD-10-CM | POA: Diagnosis not present

## 2016-11-21 DIAGNOSIS — D509 Iron deficiency anemia, unspecified: Secondary | ICD-10-CM | POA: Diagnosis not present

## 2016-11-21 DIAGNOSIS — N186 End stage renal disease: Secondary | ICD-10-CM | POA: Diagnosis not present

## 2016-11-21 DIAGNOSIS — D631 Anemia in chronic kidney disease: Secondary | ICD-10-CM | POA: Diagnosis not present

## 2016-11-23 DIAGNOSIS — D509 Iron deficiency anemia, unspecified: Secondary | ICD-10-CM | POA: Diagnosis not present

## 2016-11-23 DIAGNOSIS — N186 End stage renal disease: Secondary | ICD-10-CM | POA: Diagnosis not present

## 2016-11-23 DIAGNOSIS — D631 Anemia in chronic kidney disease: Secondary | ICD-10-CM | POA: Diagnosis not present

## 2016-11-23 DIAGNOSIS — N2581 Secondary hyperparathyroidism of renal origin: Secondary | ICD-10-CM | POA: Diagnosis not present

## 2016-11-24 ENCOUNTER — Encounter (HOSPITAL_COMMUNITY): Payer: Self-pay | Admitting: Emergency Medicine

## 2016-11-24 ENCOUNTER — Ambulatory Visit (HOSPITAL_COMMUNITY)
Admission: EM | Admit: 2016-11-24 | Discharge: 2016-11-24 | Disposition: A | Discharge status: Home / Self Care | Payer: Medicare Other | Attending: Physician Assistant | Admitting: Physician Assistant

## 2016-11-24 DIAGNOSIS — J310 Chronic rhinitis: Secondary | ICD-10-CM | POA: Diagnosis not present

## 2016-11-24 DIAGNOSIS — M25511 Pain in right shoulder: Secondary | ICD-10-CM | POA: Diagnosis not present

## 2016-11-24 LAB — POCT I-STAT, CHEM 8
BUN: 17 mg/dL (ref 6–20)
CALCIUM ION: 1.23 mmol/L (ref 1.15–1.40)
CHLORIDE: 99 mmol/L — AB (ref 101–111)
Creatinine, Ser: 4.9 mg/dL — ABNORMAL HIGH (ref 0.44–1.00)
GLUCOSE: 100 mg/dL — AB (ref 65–99)
HCT: 37 % (ref 36.0–46.0)
Hemoglobin: 12.6 g/dL (ref 12.0–15.0)
Potassium: 4.3 mmol/L (ref 3.5–5.1)
SODIUM: 139 mmol/L (ref 135–145)
TCO2: 31 mmol/L (ref 22–32)

## 2016-11-24 MED ORDER — FLUTICASONE PROPIONATE 50 MCG/ACT NA SUSP: 2.0000 | Freq: Every day | NASAL | 2 refills | Status: DC

## 2016-11-24 MED ORDER — IPRATROPIUM BROMIDE 0.06 % NA SOLN: 2.0000 | Freq: Four times a day (QID) | NASAL | 12 refills | Status: DC

## 2016-11-24 MED ORDER — DOXYCYCLINE HYCLATE 100 MG PO CAPS: 100.0000 mg | ORAL_CAPSULE | Freq: Two times a day (BID) | ORAL | 0 refills | Status: AC

## 2016-11-24 NOTE — Discharge Instructions (Signed)
Aovid doxy unless you develop fever, teeth pain, sinus pain, then okay to fill.

## 2016-11-24 NOTE — ED Provider Notes (Signed)
11/24/2016 1:33 PM   DOB: August 30, 1955 / MRN: [id number]  SUBJECTIVE:  [name] Marks is a 61 y.o. female presenting nasal drainage that started about 1 month ago.  Does admit to some sneezing and throat pain.   Denies fever, eye itching, throat itching, and ear itching. No fever, cough.  History of dialysis.  Feels that she is getting worse. Has not tried any medication.   Complaining of right shoulder pain.  CHL reveals a history of bone spurs about same per rads.    She is allergic to contrast media [iodinated diagnostic agents] and naproxen sodium.   She  has a past medical history of Complication of anesthesia; End stage renal disease on dialysis [facility]); GERD (gastroesophageal reflux disease); Gout; Osteoporosis (09/2016); Septic shock ([place]); and Staphylococcus aureus bacteremia.    She  reports that she has never smoked. She has never used smokeless tobacco. She reports that she does not drink alcohol or use drugs. She  reports that she does not currently engage in sexual activity. She reports using the following method of birth control/protection: Post-menopausal. The patient  has a past surgical history that includes DG AV DIALYSIS GRAFT DECLOT OR; Tubal ligation (1983); Dilatation & curettage/hysteroscopy with trueclear (N/A, 11/06/2012); Cardiac catheterization (N/A, 09/14/2014); Cardiac catheterization (N/A, 09/14/2014); Cardiac catheterization (Right, 12/09/2014); Cardiac catheterization (N/A, 12/09/2014); Cardiac catheterization (Right, 05/24/2015); Cardiac catheterization (Right, 05/24/2015); Cardiac catheterization (N/A, 06/13/2015); Cardiac catheterization (N/A, 06/13/2015); Insertion of dialysis catheter (N/A, 02/19/2016); Arteriovenous goretex graft removal (Right, 02/17/2016); Patch angioplasty (Right, 02/17/2016); TEE without cardioversion (N/A, 02/22/2016); Aortic valve replacement (N/A, 02/25/2016); TEE without cardioversion (N/A, 02/25/2016); Breast biopsy; Breast biopsy; LOOP RECORDER  INSERTION (N/A, 10/01/2016); and TEE without cardioversion (N/A, 10/01/2016).  Her family history includes Alcohol abuse in her father and mother; Breast cancer (age of onset: 5) in her sister; Cancer in her father; Diabetes in her mother; Heart disease in her mother; Hypertension in her mother, sister, and son; Kidney disease in her mother, sister, and son.  Review of Systems  Constitutional: Negative for chills, diaphoresis and fever.  HENT: Negative for ear discharge, ear pain, hearing loss, nosebleeds, sinus pain and tinnitus.   Eyes: Negative.   Respiratory: Negative for cough, hemoptysis, sputum production, shortness of breath and wheezing.   Cardiovascular: Negative for chest pain, orthopnea and leg swelling.  Gastrointestinal: Negative for nausea.  Skin: Negative for rash.  Neurological: Negative for dizziness, sensory change, speech change, focal weakness and headaches.    OBJECTIVE:  BP (!) 108/44 (BP Location: Right Arm)   Pulse 72   Temp 98.3 F (36.8 C) (Oral)   Resp 20   SpO2 100%   Wt Readings from Last 3 Encounters:  11/06/16 168 lb (76.2 kg)  10/28/16 167 lb (75.8 kg)  10/11/16 167 lb 9.6 oz (76 kg)   Temp Readings from Last 3 Encounters:  11/24/16 98.3 F (36.8 C) (Oral)  11/09/16 98.1 F (36.7 C) (Oral)  11/06/16 98.3 F (36.8 C) (Oral)   BP Readings from Last 3 Encounters:  11/24/16 (!) 108/44  11/09/16 (!) 141/65  11/06/16 140/70   Pulse Readings from Last 3 Encounters:  11/24/16 72  11/09/16 (!) 59  11/06/16 72   Lab Results  Component Value Date   HGBA1C 4.8 09/29/2016   Lab Results  Component Value Date   ALT 35 11/09/2016   AST 120 (H) 11/09/2016   ALKPHOS 268 (H) 11/09/2016   BILITOT 1.3 (H) 11/09/2016      Physical Exam  Constitutional: She  is active.  Non-toxic appearance.  HENT:  Right Ear: Hearing, tympanic membrane, external ear and ear canal normal.  Left Ear: Hearing, tympanic membrane, external ear and ear canal normal.   Nose: Nose normal. Right sinus exhibits no maxillary sinus tenderness and no frontal sinus tenderness. Left sinus exhibits no maxillary sinus tenderness and no frontal sinus tenderness.  Mouth/Throat: Uvula is midline, oropharynx is clear and moist and mucous membranes are normal. Mucous membranes are not dry. No oropharyngeal exudate, posterior oropharyngeal edema or tonsillar abscesses.  Cardiovascular: Normal rate.   Pulmonary/Chest: Effort normal. No tachypnea.  Musculoskeletal: Normal range of motion. She exhibits no edema, tenderness or deformity.  Lymphadenopathy:       Head (right side): No submandibular and no tonsillar adenopathy present.       Head (left side): No submandibular and no tonsillar adenopathy present.    She has no cervical adenopathy.  Neurological: She is alert.  Skin: Skin is warm and dry. She is not diaphoretic. No pallor.    Results for orders placed or performed during the hospital encounter of 11/24/16 (from the past 72 hour(s))  I-STAT, chem 8     Status: Abnormal   Collection Time: 11/24/16  1:01 PM  Result Value Ref Range   Sodium 139 135 - 145 mmol/L   Potassium 4.3 3.5 - 5.1 mmol/L   Chloride 99 (L) 101 - 111 mmol/L   BUN 17 6 - 20 mg/dL   Creatinine, Ser 4.90 (H) 0.44 - 1.00 mg/dL   Glucose, Bld 100 (H) 65 - 99 mg/dL   Calcium, Ion 1.23 1.15 - 1.40 mmol/L   TCO2 31 22 - 32 mmol/L   Hemoglobin 12.6 12.0 - 15.0 g/dL   HCT 37.0 36.0 - 46.0 %    No results found.  ASSESSMENT AND PLAN:  The encounter diagnosis was Chronic rhinitis, vs allergic vs bacterial cause.  Advised that she try to hold doxy unless she develops facial pain, fever, teeth pain or intractable HA.  Otherwise topical measures per scripts. RTC as needed.     The patient is advised to call or return to clinic if she does not see an improvement in symptoms, or to seek the care of the closest emergency department if she worsens with the above plan.   Philis Fendt, MHS,  PA-C 11/24/2016 1:33 PM    Tereasa Coop, PA-C 11/24/16 1334

## 2016-11-24 NOTE — ED Triage Notes (Signed)
Pt here for cold sx onset 3 days associated w/nasal drainage, chills  Denies fevers, cough  Has not had any meds  Reports she is a dialysis pt.   A&O x4... NAD... Ambulatory

## 2016-11-25 DIAGNOSIS — N039 Chronic nephritic syndrome with unspecified morphologic changes: Secondary | ICD-10-CM | POA: Diagnosis not present

## 2016-11-25 DIAGNOSIS — Z992 Dependence on renal dialysis: Secondary | ICD-10-CM | POA: Diagnosis not present

## 2016-11-25 DIAGNOSIS — N186 End stage renal disease: Secondary | ICD-10-CM | POA: Diagnosis not present

## 2016-11-26 DIAGNOSIS — D509 Iron deficiency anemia, unspecified: Secondary | ICD-10-CM | POA: Diagnosis not present

## 2016-11-26 DIAGNOSIS — N186 End stage renal disease: Secondary | ICD-10-CM | POA: Diagnosis not present

## 2016-11-26 DIAGNOSIS — N2581 Secondary hyperparathyroidism of renal origin: Secondary | ICD-10-CM | POA: Diagnosis not present

## 2016-11-26 DIAGNOSIS — D631 Anemia in chronic kidney disease: Secondary | ICD-10-CM | POA: Diagnosis not present

## 2016-11-28 DIAGNOSIS — N2581 Secondary hyperparathyroidism of renal origin: Secondary | ICD-10-CM | POA: Diagnosis not present

## 2016-11-28 DIAGNOSIS — D631 Anemia in chronic kidney disease: Secondary | ICD-10-CM | POA: Diagnosis not present

## 2016-11-28 DIAGNOSIS — N186 End stage renal disease: Secondary | ICD-10-CM | POA: Diagnosis not present

## 2016-11-28 DIAGNOSIS — D509 Iron deficiency anemia, unspecified: Secondary | ICD-10-CM | POA: Diagnosis not present

## 2016-11-29 ENCOUNTER — Other Ambulatory Visit: Payer: Self-pay

## 2016-11-29 DIAGNOSIS — N185 Chronic kidney disease, stage 5: Secondary | ICD-10-CM

## 2016-11-30 ENCOUNTER — Ambulatory Visit (INDEPENDENT_AMBULATORY_CARE_PROVIDER_SITE_OTHER): Payer: Medicare Other | Admitting: *Deleted

## 2016-11-30 ENCOUNTER — Other Ambulatory Visit: Payer: Self-pay | Admitting: *Deleted

## 2016-11-30 DIAGNOSIS — D509 Iron deficiency anemia, unspecified: Secondary | ICD-10-CM | POA: Diagnosis not present

## 2016-11-30 DIAGNOSIS — N2581 Secondary hyperparathyroidism of renal origin: Secondary | ICD-10-CM | POA: Diagnosis not present

## 2016-11-30 DIAGNOSIS — N186 End stage renal disease: Secondary | ICD-10-CM | POA: Diagnosis not present

## 2016-11-30 DIAGNOSIS — I639 Cerebral infarction, unspecified: Secondary | ICD-10-CM

## 2016-11-30 DIAGNOSIS — D631 Anemia in chronic kidney disease: Secondary | ICD-10-CM | POA: Diagnosis not present

## 2016-11-30 MED ORDER — CLOPIDOGREL BISULFATE 75 MG PO TABS: 75.0000 mg | ORAL_TABLET | Freq: Every day | ORAL | 0 refills | Status: DC

## 2016-12-03 DIAGNOSIS — D631 Anemia in chronic kidney disease: Secondary | ICD-10-CM | POA: Diagnosis not present

## 2016-12-03 DIAGNOSIS — N186 End stage renal disease: Secondary | ICD-10-CM | POA: Diagnosis not present

## 2016-12-03 DIAGNOSIS — N2581 Secondary hyperparathyroidism of renal origin: Secondary | ICD-10-CM | POA: Diagnosis not present

## 2016-12-03 DIAGNOSIS — D509 Iron deficiency anemia, unspecified: Secondary | ICD-10-CM | POA: Diagnosis not present

## 2016-12-03 LAB — CUP PACEART REMOTE DEVICE CHECK
Date Time Interrogation Session: 20181005173850
MDC IDC PG IMPLANT DT: 20180806

## 2016-12-03 NOTE — Progress Notes (Signed)
Carelink Summary Report / Loop Recorder 

## 2016-12-05 DIAGNOSIS — D631 Anemia in chronic kidney disease: Secondary | ICD-10-CM | POA: Diagnosis not present

## 2016-12-05 DIAGNOSIS — N2581 Secondary hyperparathyroidism of renal origin: Secondary | ICD-10-CM | POA: Diagnosis not present

## 2016-12-05 DIAGNOSIS — D509 Iron deficiency anemia, unspecified: Secondary | ICD-10-CM | POA: Diagnosis not present

## 2016-12-05 DIAGNOSIS — N186 End stage renal disease: Secondary | ICD-10-CM | POA: Diagnosis not present

## 2016-12-06 ENCOUNTER — Encounter (HOSPITAL_COMMUNITY): Payer: Medicare Other

## 2016-12-06 ENCOUNTER — Ambulatory Visit: Payer: Medicare Other | Admitting: Vascular Surgery

## 2016-12-06 ENCOUNTER — Other Ambulatory Visit (HOSPITAL_COMMUNITY): Payer: Medicare Other

## 2016-12-06 DIAGNOSIS — K8010 Calculus of gallbladder with chronic cholecystitis without obstruction: Secondary | ICD-10-CM | POA: Diagnosis not present

## 2016-12-07 DIAGNOSIS — N2581 Secondary hyperparathyroidism of renal origin: Secondary | ICD-10-CM | POA: Diagnosis not present

## 2016-12-07 DIAGNOSIS — N186 End stage renal disease: Secondary | ICD-10-CM | POA: Diagnosis not present

## 2016-12-07 DIAGNOSIS — D509 Iron deficiency anemia, unspecified: Secondary | ICD-10-CM | POA: Diagnosis not present

## 2016-12-07 DIAGNOSIS — D631 Anemia in chronic kidney disease: Secondary | ICD-10-CM | POA: Diagnosis not present

## 2016-12-10 DIAGNOSIS — D509 Iron deficiency anemia, unspecified: Secondary | ICD-10-CM | POA: Diagnosis not present

## 2016-12-10 DIAGNOSIS — N186 End stage renal disease: Secondary | ICD-10-CM | POA: Diagnosis not present

## 2016-12-10 DIAGNOSIS — D631 Anemia in chronic kidney disease: Secondary | ICD-10-CM | POA: Diagnosis not present

## 2016-12-10 DIAGNOSIS — N2581 Secondary hyperparathyroidism of renal origin: Secondary | ICD-10-CM | POA: Diagnosis not present

## 2016-12-11 ENCOUNTER — Encounter: Payer: Self-pay | Admitting: Internal Medicine

## 2016-12-11 ENCOUNTER — Ambulatory Visit (INDEPENDENT_AMBULATORY_CARE_PROVIDER_SITE_OTHER): Payer: Medicare Other | Admitting: Internal Medicine

## 2016-12-11 VITALS — BP 124/70 | HR 75 | Ht 61.0 in | Wt 164.0 lb

## 2016-12-11 DIAGNOSIS — I639 Cerebral infarction, unspecified: Secondary | ICD-10-CM

## 2016-12-11 DIAGNOSIS — M810 Age-related osteoporosis without current pathological fracture: Secondary | ICD-10-CM | POA: Diagnosis not present

## 2016-12-11 DIAGNOSIS — M818 Other osteoporosis without current pathological fracture: Secondary | ICD-10-CM | POA: Diagnosis not present

## 2016-12-11 NOTE — Progress Notes (Addendum)
Patient ID: [name] Marks, female   DOB: October 02, 1955, 61 y.o.   MRN: [id number]    HPI  [name] Marks is a 61 y.o.-year-old female, referred by Dr. Phineas Real, for management of osteoporosis.   Pt was dx with OP in 09/2016.  I reviewed pt's DEXA scans: Date L1-L3 T score FN T score 33% distal Radius  10/16/2016 (GGA) -1.5 RFN: -2.6 LFN: -1.8    She had fracture - foot 5-6 years ago.  No dizziness/vertigo/orthostasis/poor vision. No falls, but after sepsis earlier this year >> could not walk >> learned to walk again >> now using a cane.   She has a h/o TIAs >> but no motor deficits.  Previous OP treatments: none.  No h/o vitamin D deficiency. No available vit D levels. No results found for: VD25OH  Pt is not on calcium;on vitamin D supplement - 1000 units (in ProRenal)   + some weight bearing exercises.   She does not take high vitamin A doses.  Menopause was at ~51-51 y/o.   Pt does not have a FH of osteoporosis.  + h/o hypocalcemia or hyperparathyroidism. No h/o kidney stones. Lab Results  Component Value Date   CALCIUM 8.8 (L) 11/09/2016   CALCIUM 9.4 10/28/2016   CALCIUM 8.9 10/02/2016   CALCIUM 9.2 10/01/2016   CALCIUM 9.5 09/29/2016   CALCIUM 9.4 09/28/2016   CALCIUM 10.0 05/17/2016   CALCIUM 8.7 (L) 05/16/2016   CALCIUM 8.2 (L) 03/26/2016   CALCIUM 9.5 03/05/2016   No h/o thyrotoxicosis. Reviewed TSH recent levels:  Lab Results  Component Value Date   TSH 2.809 05/17/2016   + ESRD. Has FH of renal ds. Last BUN/Cr: Lab Results  Component Value Date   BUN 17 11/24/2016   CREATININE 4.90 (H) 11/24/2016   She is on HD since 2000 - MWF. On Sevelamer and Sensipar. Dr. Clover Mealy.  Latest PTH, phosphorus levels: 04/18/2016: PTH 244, phos 3.1 03/21/2016: PTH 640  01/11/2016: PTH 271  Latest alkaline phosphatase: 268 (38-126)  ROS: Constitutional: + weight loss, + hot flashes Eyes: no blurry vision, no xerophthalmia ENT: no sore throat, no nodules  palpated in throat, no dysphagia/odynophagia, no hoarseness Cardiovascular: no CP/SOB/palpitations/leg swelling Respiratory: no cough/SOB Gastrointestinal: + N/no V/D/+ C Musculoskeletal: no muscle/joint aches Skin: no rashes Neurological: no tremors/numbness/tingling/dizziness Psychiatric: no depression/anxiety  Past Medical History:  Diagnosis Date  . Complication of anesthesia    due to kidney disease  . End stage renal disease on dialysis ([place])   . GERD (gastroesophageal reflux disease)   . Gout   . Osteoporosis 09/2016   T score -2.6  . Septic shock ([place])   . Staphylococcus aureus bacteremia    Past Surgical History:  Procedure Laterality Date  . AORTIC VALVE REPLACEMENT N/A 02/25/2016   Procedure: AORTIC VALVE REPLACEMENT (AVR) implanted with Magna Ease Aortic valve size 21mm;  Surgeon: Melrose Nakayama, MD;  Location: [place];  Service: Open Heart Surgery;  Laterality: N/A;  . [place] Right 02/17/2016   Procedure: REMOVAL OF TWO ARTERIOVENOUS GORETEX GRAFTS ([place]);  Surgeon: Angelia Mould, MD;  Location: [place];  Service: Vascular;  Laterality: Right;  . BREAST BIOPSY     stereo left 2013  . BREAST BIOPSY     stereo right 2011  . DG AV DIALYSIS GRAFT DECLOT OR    . DILATATION & CURETTAGE/HYSTEROSCOPY WITH TRUECLEAR N/A 11/06/2012   Procedure: DILATATION & CURETTAGE/HYSTEROSCOPY WITH TRUECLEAR;  Surgeon: Terrance Mass, MD;  Location: [facility];  Service: Gynecology;  Laterality: N/A;  Truclear Resectoscopic Polypectomy   . INSERTION OF DIALYSIS CATHETER N/A 02/19/2016   Procedure: INSERTION OF Left Internal Jugular DIALYSIS CATHETER;  Surgeon: Angelia Mould, MD;  Location: [place];  Service: Vascular;  Laterality: N/A;  . LOOP RECORDER INSERTION N/A 10/01/2016   Procedure: LOOP RECORDER INSERTION;  Surgeon: Constance Haw, MD;  Location: [place] [facility];  Service: Cardiovascular;  Laterality: N/A;  . PATCH ANGIOPLASTY Right 02/17/2016    Procedure: PATCH ANGIOPLASTY;  Surgeon: Angelia Mould, MD;  Location: [place];  Service: Vascular;  Laterality: Right;  . PERIPHERAL VASCULAR CATHETERIZATION N/A 09/14/2014   Procedure: A/V Shuntogram/Fistulagram;  Surgeon: Katha Cabal, MD;  Location: [place] [facility];  Service: Cardiovascular;  Laterality: N/A;  . PERIPHERAL VASCULAR CATHETERIZATION N/A 09/14/2014   Procedure: A/V Shunt Intervention;  Surgeon: Katha Cabal, MD;  Location: [place] [facility];  Service: Cardiovascular;  Laterality: N/A;  . PERIPHERAL VASCULAR CATHETERIZATION Right 12/09/2014   Procedure: A/V Shuntogram/Fistulagram;  Surgeon: Algernon Huxley, MD;  Location: [place] [facility];  Service: Cardiovascular;  Laterality: Right;  . PERIPHERAL VASCULAR CATHETERIZATION N/A 12/09/2014   Procedure: A/V Shunt Intervention;  Surgeon: Algernon Huxley, MD;  Location: [place] [facility];  Service: Cardiovascular;  Laterality: N/A;  . PERIPHERAL VASCULAR CATHETERIZATION Right 05/24/2015   Procedure: A/V Shuntogram;  Surgeon: Serafina Mitchell, MD;  Location: [place] [facility];  Service: Cardiovascular;  Laterality: Right;  . PERIPHERAL VASCULAR CATHETERIZATION Right 05/24/2015   Procedure: Peripheral Vascular Balloon Angioplasty;  Surgeon: Serafina Mitchell, MD;  Location: [place] [facility];  Service: Cardiovascular;  Laterality: Right;  right arm shunt  . PERIPHERAL VASCULAR CATHETERIZATION N/A 06/13/2015   Procedure: A/V Shuntogram/Fistulagram;  Surgeon: Algernon Huxley, MD;  Location: [place] [facility];  Service: Cardiovascular;  Laterality: N/A;  . PERIPHERAL VASCULAR CATHETERIZATION N/A 06/13/2015   Procedure: A/V Shunt Intervention;  Surgeon: Algernon Huxley, MD;  Location: [place] [facility];  Service: Cardiovascular;  Laterality: N/A;  . TEE WITHOUT CARDIOVERSION N/A 02/22/2016   Procedure: TRANSESOPHAGEAL ECHOCARDIOGRAM (TEE);  Surgeon: Pixie Casino, MD;  Location[facility];  Service: Cardiovascular;   Laterality: N/A;  . TEE WITHOUT CARDIOVERSION N/A 02/25/2016   Procedure: TRANSESOPHAGEAL ECHOCARDIOGRAM (TEE);  Surgeon: Melrose Nakayama, MD;  Location: [place];  Service: Open Heart Surgery;  Laterality: N/A;  . TEE WITHOUT CARDIOVERSION N/A 10/01/2016   Procedure: TRANSESOPHAGEAL ECHOCARDIOGRAM (TEE);  Surgeon: Pixie Casino, MD;  Location: [place];  Service: Cardiovascular;  Laterality: N/A;  . [place] History   Social History  . Marital status: Single    Spouse name: N/A  . Number of children: 2   Occupational History  .    SociDisabled disabled story Main Topics  . Smoking status: Never Smoker  . Smokeless tobacco: Never Used  . Alcohol use No  . Drug use: No  . Sexual activity: Not Currently    Birth control/ protection: Post-menopausal     Comment: 1st intercourse 61 yo-Fewer than 5 partners   Current Outpatient Prescriptions on File Prior to Visit  Medication Sig Dispense Refill  . cinacalcet (SENSIPAR) 60 MG tablet Take 120 mg by mouth every Monday, Wednesday, and Friday with hemodialysis.     Marland Kitchen clopidogrel (PLAVIX) 75 MG tablet Take 1 tablet (75 mg total) by mouth daily. 30 tablet 0  . famotidine (PEPCID) 20 MG tablet Take 20 mg  by mouth 2 (two) times daily. Lunch and dinner    . fluticasone (FLONASE) 50 MCG/ACT nasal spray Place 2 sprays into both nostrils daily. 9.9 g 2  . ipratropium (ATROVENT) 0.06 % nasal spray Place 2 sprays into both nostrils 4 (four) times daily. 15 mL 12  . midodrine (PROAMATINE) 10 MG tablet Take 1 tablet (10 mg total) by mouth 2 (two) times daily as needed (30 min prior to HD). 60 tablet 0  . Multiple Vitamins-Minerals (PRORENAL QD PO) Take 1 tablet by mouth daily.     Marland Kitchen oxyCODONE (ROXICODONE) 5 MG immediate release tablet Take 0.5 tablets (2.5 mg total) by mouth every 6 (six) hours as needed for severe pain. 10 tablet 0  . sevelamer (RENVELA) 800 MG tablet Take 800 mg by mouth 3 (three) times daily with meals.       No current facility-administered medications on file prior to visit.    Allergies  Allergen Reactions  . Contrast Media [Iodinated Diagnostic Agents] Anaphylaxis  . Naproxen Sodium Itching   Family History  Problem Relation Age of Onset  . Diabetes Mother   . Hypertension Mother   . Heart disease Mother   . Alcohol abuse Mother   . Kidney disease Mother   . Cancer Father        COLON  . Alcohol abuse Father   . Breast cancer Sister 61  . Hypertension Sister   . Kidney disease Sister   . Hypertension Son   . Kidney disease Son     PE: BP 124/70 (BP Location: Left Wrist, Patient Position: Sitting)   Pulse 75   Ht 5\' 1"  (1.549 m)   Wt 164 lb (74.4 kg)   SpO2 98%   BMI 30.99 kg/m  Wt Readings from Last 3 Encounters:  12/11/16 164 lb (74.4 kg)  11/06/16 168 lb (76.2 kg)  10/28/16 167 lb (75.8 kg)   Constitutional: normal weight, in NAD. No kyphosis. Eyes: PERRLA, EOMI, no exophthalmos ENT: moist mucous membranes, no thyromegaly, no cervical lymphadenopathy Cardiovascular: RRR, No MRG, + R arm fistula not patent Respiratory: CTA B Gastrointestinal: abdomen soft, NT, ND, BS+ Musculoskeletal: no deformities, strength intact in all 4 Skin: moist, warm, no rashes, + midsternal scar Neurological: no tremor with outstretched hands, DTR normal in all 4  Assessment: 1. Osteoporosis  Plan: 1. 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 Adynamic bone disease is not suspected due to increased PTH and alkaline phosphatase. I do not feel in this case a bone Bx is mandatory. - Discussed about increased risk of fracture, depending on the T score, greatly increased when the T score is lower than -2.5, but it is actually a continuum and -2.5 should not be regarded as an absolute threshold. We reviewed her DEXA scan report and images together, and I explained that based on the T scores, she has an increased risk for fractures - she is getting  supplemental vitamin D 1000 units from her Prorenal vitamins. As she has labs in dialysis every I advised her to ask her nephrologist to send me her most recent vitamin D, calcitriol, calcium, and PTH level to avoid lab draw today. We will then see if she needs extra supplementation. - discussed fall precautions   - given handout from [place] Re: weight bearing exercises - advised to do this every day or at least 5/7 days -She is not smoking or  drinking >2 drinks of alcohol a day. - I recommended  the following book:  - We discussed about the different medication classes, benefits and side effects (including atypical fractures and ONJ - no dental workup in progress or planned).  - I explained that, since she has ESRD, I would not use oral bisphosphonates, so my first choice would be sq denosumab (Prolia) for 6-10 years. I would use Teriparatide/Abaloparatide as a last resort. She agrees as she would not like a daily injection. Pt was given reading information about Prolia, and I explained the mechanism of action and expected benefits.  I explained that Prolia would be used off label in renal disease.  - We also discussed about the possibility of working on increasing her exercise and making sure that her vitamin D level is normal while also improving her diet and repeat another DEXA scan in one year from the previous (09/2017) and decide at that time whether to continue with Prolia or with conservative measures. She actually would prefer this approach. -   I advised her to contact Dr. Phineas Real so he can order her bone mineral density scan on the same machine next year and to forward the results to me - will see pt back in a year, but will let her know if any changes are needed in her supplements after I review the labs from her nephrologist  Received labs from [facility]: - 11/21/2016: Ca 9.4, iPTH 940 - 10/17/2016: Ca 9.3, iPTH 673  Philemon Kingdom, MD  PhD [facility] Endocrinology

## 2016-12-11 NOTE — Patient Instructions (Addendum)
Please ask the kidney dr. To send me the following labs: - intact PTH - calcium - vitamin D - vitamin 1,25 dihydroxy D (calcitriol)  How Can I Prevent Falls? Men and women with osteoporosis need to take care not to fall down. Falls can break bones. Some reasons people fall are: Poor vision  Poor balance  Certain diseases that affect how you walk  Some types of medicine, such as sleeping pills.  Some tips to help prevent falls outdoors are: Use a cane or walker  Wear rubber-soled shoes so you don't slip  Walk on grass when sidewalks are slippery  In winter, put salt or kitty litter on icy sidewalks.  Some ways to help prevent falls indoors are: Keep rooms free of clutter, especially on floors  Use plastic or carpet runners on slippery floors  Wear low-heeled shoes that provide good support  Do not walk in socks, stockings, or slippers  Be sure carpets and area rugs have skid-proof backs or are tacked to the floor  Be sure stairs are well lit and have rails on both sides  Put grab bars on bathroom walls near tub, shower, and toilet  Use a rubber bath mat in the shower or tub  Keep a flashlight next to your bed  Use a sturdy step stool with a handrail and wide steps  Add more lights in rooms (and night lights) Buy a cordless phone to keep with you so that you don't have to rush to the phone       when it rings and so that you can call for help if you fall.   (adapted from http://www.niams.NightlifePreviews.se)  Please check the following book:   Denosumab: Patient drug information (Up-to-date) Copyright [phone] [employer]inch rights reserved.  Brand Names: [place]  ProliaDelton See What is this drug used for?  It is used to treat soft, brittle bones (osteoporosis).  It is used for bone growth.  It is used when treating some cancers.  It may be given to you for other reasons. Talk with the doctor. What do I need to tell my doctor  BEFORE I take this drug?  All products:  If you have an allergy to denosumab or any other part of this drug.  If you are allergic to any drugs like this one, any other drugs, foods, or other substances. Tell your doctor about the allergy and what signs you had, like rash; hives; itching; shortness of breath; wheezing; cough; swelling of face, lips, tongue, or throat; or any other signs.  If you have low calcium levels.  Prolia:  If you are pregnant or may be pregnant. Do not take this drug if you are pregnant.  This is not a list of all drugs or health problems that interact with this drug.  Tell your doctor and pharmacist about all of your drugs (prescription or OTC, natural products, vitamins) and health problems. You must check to make sure that it is safe for you to take this drug with all of your drugs and health problems. Do not start, stop, or change the dose of any drug without checking with your doctor. What are some things I need to know or do while I take this drug?  All products:  Tell dentists, surgeons, and other doctors that you use this drug.  This drug may raise the chance of a broken leg. Talk with your doctor.  Have your blood work checked. Talk with your doctor.  Have a bone  density test. Talk with your doctor.  Take calcium and vitamin D as you were told by your doctor.  Have a dental exam before starting this drug.  Take good care of your teeth. See a dentist often.  If you smoke, talk with your doctor.  Do not give to a child. Talk with your doctor.  Tell your doctor if you are breast-feeding. You will need to talk about any risks to your baby.  Delton See:  This drug may cause harm to the unborn baby if you take it while you are pregnant. If you get pregnant while taking this drug, call your doctor right away.  Prolia:  Very bad infections have been reported with use of this drug. If you have any infection, are taking antibiotics now or in the recent  past, or have many infections, talk with your doctor.  You may have more chance of getting an infection. Wash hands often. Stay away from people with infections, colds, or flu.  Use birth control that you can trust to prevent pregnancy while taking this drug.  If you are a man and your sex partner is pregnant or gets pregnant at any time while you are being treated, talk with your doctor. What are some side effects that I need to call my doctor about right away?  WARNING/CAUTION: Even though it may be rare, some people may have very bad and sometimes deadly side effects when taking a drug. Tell your doctor or get medical help right away if you have any of the following signs or symptoms that may be related to a very bad side effect:  All products:  Signs of an allergic reaction, like rash; hives; itching; red, swollen, blistered, or peeling skin with or without fever; wheezing; tightness in the chest or throat; trouble breathing or talking; unusual hoarseness; or swelling of the mouth, face, lips, tongue, or throat.  Signs of low calcium levels like muscle cramps or spasms, numbness and tingling, or seizures.  Mouth sores.  Any new or strange groin, hip, or thigh pain.  This drug may cause jawbone problems. The chance may be higher the longer you take this drug. The chance may be higher if you have cancer, dental problems, dentures that do not fit well, anemia, blood clotting problems, or an infection. The chance may also be higher if you are having dental work or if you are getting chemo, some steroid drugs, or radiation. Call your doctor right away if you have jaw swelling or pain.  Xgeva:  Not hungry.  Muscle pain or weakness.  Seizures.  Shortness of breath.  Prolia:  Signs of infection. These include a fever of 100.25F (38C) or higher, chills, very bad sore throat, ear or sinus pain, cough, more sputum or change in color of sputum, pain with passing urine, mouth sores, wound  that will not heal, or anal itching or pain.  Signs of a pancreas problem (pancreatitis) like very bad stomach pain, very bad back pain, or very bad upset stomach or throwing up.  Chest pain.  A heartbeat that does not feel normal.  Very bad skin irritation.  Feeling very tired or weak.  Bladder pain or pain when passing urine or change in how much urine is passed.  Passing urine often.  Swelling in the arms or legs. What are some other side effects of this drug?  All drugs may cause side effects. However, many people have no side effects or only have minor side effects. Call your  doctor or get medical help if any of these side effects or any other side effects bother you or do not go away:  Xgeva:  Feeling tired or weak.  Headache.  Upset stomach or throwing up.  Loose stools (diarrhea).  Cough.  Prolia:  Back pain.  Muscle or joint pain.  Sore throat.  Runny nose.  Pain in arms or legs.  These are not all of the side effects that may occur. If you have questions about side effects, call your doctor. Call your doctor for medical advice about side effects.  You may report side effects to your national health agency. How is this drug best taken?  Use this drug as ordered by your doctor. Read and follow the dosing on the label closely.  It is given as a shot into the fatty part of the skin. What do I do if I miss a dose?  Call the doctor to find out what to do. How do I store and/or throw out this drug?  This drug will be given to you in a hospital or doctor's office. You will not store it at home.  Keep all drugs out of the reach of children and pets.  Check with your pharmacist about how to throw out unused drugs.  General drug facts  If your symptoms or health problems do not get better or if they become worse, call your doctor.  Do not share your drugs with others and do not take anyone else's drugs.  Keep a list of all your drugs (prescription,  natural products, vitamins, OTC) with you. Give this list to your doctor.  Talk with the doctor before starting any new drug, including prescription or OTC, natural products, or vitamins.  Some drugs may have another patient information leaflet. If you have any questions about this drug, please talk with your doctor, pharmacist, or other health care provider.  If you think there has been an overdose, call your poison control center or get medical care right away. Be ready to tell or show what was taken, how much, and when it happened.  Exercise for Strong Bones (from [place]) There are two types of exercises that are important for building and maintaining bone density:  weight-bearing and muscle-strengthening exercises. Weight-bearing Exercises These exercises include activities that make you move against gravity while staying upright. Weight-bearing exercises can be high-impact or low-impact. High-impact weight-bearing exercises help build bones and keep them strong. If you have broken a bone due to osteoporosis or are at risk of breaking a bone, you may need to avoid high-impact exercises. If youre not sure, you should check with your healthcare provider. Examples of high-impact weight-bearing exercises are:  Dancing  Doing high-impact aerobics  Hiking  Jogging/running  Jumping Rope  Stair climbing  Tennis Low-impact weight-bearing exercises can also help keep bones strong and are a safe alternative if you cannot do high-impact exercises. Examples of low-impact weight-bearing exercises are:  Using elliptical training machines  Doing low-impact aerobics  Using stair-step machines  Fast walking on a treadmill or outside Muscle-Strengthening Exercises These exercises include activities where you move your body, a weight or some other resistance against gravity. They are also known as resistance exercises and include:  Lifting weights  Using elastic  exercise bands  Using weight machines  Lifting your own body weight  Functional movements, such as standing and rising up on your toes Yoga and Pilates can also improve strength, balance and flexibility. However, certain positions may  not be safe for people with osteoporosis or those at increased risk of broken bones. For example, exercises that have you bend forward may increase the chance of breaking a bone in the spine. A physical therapist should be able to help you learn which exercises are safe and appropriate for you. Non-Impact Exercises Non-impact exercises can help you to improve balance, posture and how well you move in everyday activities. These exercises can also help to increase muscle strength and decrease the risk of falls and broken bones. Some of these exercises include:  Balance exercises that strengthen your legs and test your balance, such as Tai Chi, can decrease your risk of falls.  Posture exercises that improve your posture and reduce rounded or sloping shoulders can help you decrease the chance of breaking a bone, especially in the spine.  Functional exercises that improve how well you move can help you with everyday activities and decrease your chance of falling and breaking a bone. For example, if you have trouble getting up from a chair or climbing stairs, you should do these activities as exercises. A physical therapist can teach you balance, posture and functional exercises. Starting a New Exercise Program If you havent exercised regularly for a while, check with your healthcare provider before beginning a new exercise program--particularly if you have health problems such as heart disease, diabetes or high blood pressure. If youre at high risk of breaking a bone, you should work with a physical therapist to develop a safe exercise program. Once you have your healthcare providers approval, start slowly. If youve already broken bones in the spine because of  osteoporosis, be very careful to avoid activities that require reaching down, bending forward, rapid twisting motions, heavy lifting and those that increase your chance of a fall. As you get started, your muscles may feel sore for a day or two after you exercise. If soreness lasts longer, you may be working too hard and need to ease up. Exercises should be done in a pain-free range of motion. How Much Exercise Do You Need? Weight-bearing exercises 30 minutes on most days of the week. Do a 30-minutesession or multiple sessions spread out throughout the day. The benefits to your bones are the same.   Muscle-strengthening exercises Two to three days per week. If you dont have much time for strengthening/resistance training, do small amounts at a time. You can do just one body part each day. For example do arms one day, legs the next and trunk the next. You can also spread these exercises out during your normal day.  Balance, posture and functional exercises Every day or as often as needed. You may want to focus on one area more than the others. If you have fallen or lose your balance, spend time doing balance exercises. If you are getting rounded shoulders, work more on posture exercises. If you have trouble climbing stairs or getting up from the couch, do more functional exercises. You can also perform these exercises at one time or spread them during your day. Work with a phyiscal therapist to learn the right exercises for you.    Please come back for a follow-up appointment in 1 year, but please ask Dr. Phineas Real to obtain another DEXA scan in 09/2017.

## 2016-12-12 DIAGNOSIS — D631 Anemia in chronic kidney disease: Secondary | ICD-10-CM | POA: Diagnosis not present

## 2016-12-12 DIAGNOSIS — D509 Iron deficiency anemia, unspecified: Secondary | ICD-10-CM | POA: Diagnosis not present

## 2016-12-12 DIAGNOSIS — N2581 Secondary hyperparathyroidism of renal origin: Secondary | ICD-10-CM | POA: Diagnosis not present

## 2016-12-12 DIAGNOSIS — N186 End stage renal disease: Secondary | ICD-10-CM | POA: Diagnosis not present

## 2016-12-14 DIAGNOSIS — D509 Iron deficiency anemia, unspecified: Secondary | ICD-10-CM | POA: Diagnosis not present

## 2016-12-14 DIAGNOSIS — N186 End stage renal disease: Secondary | ICD-10-CM | POA: Diagnosis not present

## 2016-12-14 DIAGNOSIS — D631 Anemia in chronic kidney disease: Secondary | ICD-10-CM | POA: Diagnosis not present

## 2016-12-14 DIAGNOSIS — N2581 Secondary hyperparathyroidism of renal origin: Secondary | ICD-10-CM | POA: Diagnosis not present

## 2016-12-17 DIAGNOSIS — D631 Anemia in chronic kidney disease: Secondary | ICD-10-CM | POA: Diagnosis not present

## 2016-12-17 DIAGNOSIS — N186 End stage renal disease: Secondary | ICD-10-CM | POA: Diagnosis not present

## 2016-12-17 DIAGNOSIS — D509 Iron deficiency anemia, unspecified: Secondary | ICD-10-CM | POA: Diagnosis not present

## 2016-12-17 DIAGNOSIS — N2581 Secondary hyperparathyroidism of renal origin: Secondary | ICD-10-CM | POA: Diagnosis not present

## 2016-12-19 DIAGNOSIS — D631 Anemia in chronic kidney disease: Secondary | ICD-10-CM | POA: Diagnosis not present

## 2016-12-19 DIAGNOSIS — N186 End stage renal disease: Secondary | ICD-10-CM | POA: Diagnosis not present

## 2016-12-19 DIAGNOSIS — N2581 Secondary hyperparathyroidism of renal origin: Secondary | ICD-10-CM | POA: Diagnosis not present

## 2016-12-19 DIAGNOSIS — D509 Iron deficiency anemia, unspecified: Secondary | ICD-10-CM | POA: Diagnosis not present

## 2016-12-20 ENCOUNTER — Encounter: Payer: Self-pay | Admitting: Vascular Surgery

## 2016-12-20 ENCOUNTER — Encounter (HOSPITAL_COMMUNITY): Payer: Medicare Other

## 2016-12-20 ENCOUNTER — Other Ambulatory Visit (HOSPITAL_COMMUNITY): Payer: Medicare Other

## 2016-12-20 ENCOUNTER — Ambulatory Visit (INDEPENDENT_AMBULATORY_CARE_PROVIDER_SITE_OTHER): Payer: Medicare Other | Admitting: Vascular Surgery

## 2016-12-20 VITALS — BP 117/74 | HR 68 | Temp 97.0°F | Resp 18 | Ht 61.0 in | Wt 164.8 lb

## 2016-12-20 DIAGNOSIS — I639 Cerebral infarction, unspecified: Secondary | ICD-10-CM

## 2016-12-20 DIAGNOSIS — Z992 Dependence on renal dialysis: Secondary | ICD-10-CM | POA: Diagnosis not present

## 2016-12-20 DIAGNOSIS — N186 End stage renal disease: Secondary | ICD-10-CM

## 2016-12-20 NOTE — H&P (View-Only) (Signed)
Patient is a 61 year old female here for evaluation for possible thigh graft for hemodialysis access. She was last seen by my partner Dr. Donzetta Matters June 2018 who recommended a thigh graft at that point. She has previously had a right thigh graft. Her current dialysis schedule is Monday Wednesday Friday. She has a left side dialysis catheter in her internal jugular vein. She has had multiple catheter infections and previous right upper arm graft infection.  She had essentially normal ABIs may of 2018.  She previously thought that she was too weak to undergo the procedure and has put off until now. She now wishes to proceed.  She is on Plavix.  Review of systems: She denies shortness of breath. She denies chest pain. Her dialysis center is on [address] Monday Wednesday Friday.  Current Outpatient Prescriptions on File Prior to Visit  Medication Sig Dispense Refill  . cinacalcet (SENSIPAR) 60 MG tablet Take 120 mg by mouth every Monday, Wednesday, and Friday with hemodialysis.     Marland Kitchen clopidogrel (PLAVIX) 75 MG tablet Take 1 tablet (75 mg total) by mouth daily. 30 tablet 0  . famotidine (PEPCID) 20 MG tablet Take 20 mg by mouth 2 (two) times daily. Lunch and dinner    . fluticasone (FLONASE) 50 MCG/ACT nasal spray Place 2 sprays into both nostrils daily. 9.9 g 2  . ipratropium (ATROVENT) 0.06 % nasal spray Place 2 sprays into both nostrils 4 (four) times daily. 15 mL 12  . midodrine (PROAMATINE) 10 MG tablet Take 1 tablet (10 mg total) by mouth 2 (two) times daily as needed (30 min prior to HD). 60 tablet 0  . Multiple Vitamins-Minerals (PRORENAL QD PO) Take 1 tablet by mouth daily.     Marland Kitchen oxyCODONE (ROXICODONE) 5 MG immediate release tablet Take 0.5 tablets (2.5 mg total) by mouth every 6 (six) hours as needed for severe pain. 10 tablet 0  . sevelamer (RENVELA) 800 MG tablet Take 800 mg by mouth 3 (three) times daily with meals.      No current facility-administered medications on file prior to visit.       Physical exam:  Vitals:   12/20/16 1000  BP: 117/74  Pulse: 68  Resp: 18  Temp: (!) 97 F (36.1 C)  TempSrc: Oral  SpO2: 100%  Weight: 164 lb 12.8 oz (74.8 kg)  Height: 5\' 1"  (1.549 m)    Extremities: 2+ femoral and dorsalis pedis pulse left foot no prior scars, right leg occluded thigh graft  Neck: Left-sided dialysis catheter  Assessment: Patient has exhausted upper extremity access sites with multiple prior infections and failed grafts. She has also had a failed right thigh graft. She is currently dialyzing via left side catheter.  Plan: Placement of left thigh graft for long-term hemodialysis access. The patient could not decide on the OR date today. She will call us in the near future if she wishes to schedule this. Risks benefits possible palpitations and procedure details including but not limited to bleeding infection graft thrombosis ischemic steal all been explained the patient previously and she understands and agrees to proceed.  Ruta Hinds, MD [facility] Office: [phone] Pager: [phone]

## 2016-12-20 NOTE — Progress Notes (Signed)
Patient is a 61 year old female here for evaluation for possible thigh graft for hemodialysis access. She was last seen by my partner Dr. Donzetta Matters June 2018 who recommended a thigh graft at that point. She has previously had a right thigh graft. Her current dialysis schedule is Monday Wednesday Friday. She has a left side dialysis catheter in her internal jugular vein. She has had multiple catheter infections and previous right upper arm graft infection.  She had essentially normal ABIs may of 2018.  She previously thought that she was too weak to undergo the procedure and has put off until now. She now wishes to proceed.  She is on Plavix.  Review of systems: She denies shortness of breath. She denies chest pain. Her dialysis center is on [place]., Monday Wednesday Friday.  Current Outpatient Prescriptions on File Prior to Visit  Medication Sig Dispense Refill  . cinacalcet (SENSIPAR) 60 MG tablet Take 120 mg by mouth every Monday, Wednesday, and Friday with hemodialysis.     Marland Kitchen clopidogrel (PLAVIX) 75 MG tablet Take 1 tablet (75 mg total) by mouth daily. 30 tablet 0  . famotidine (PEPCID) 20 MG tablet Take 20 mg by mouth 2 (two) times daily. Lunch and dinner    . fluticasone (FLONASE) 50 MCG/ACT nasal spray Place 2 sprays into both nostrils daily. 9.9 g 2  . ipratropium (ATROVENT) 0.06 % nasal spray Place 2 sprays into both nostrils 4 (four) times daily. 15 mL 12  . midodrine (PROAMATINE) 10 MG tablet Take 1 tablet (10 mg total) by mouth 2 (two) times daily as needed (30 min prior to HD). 60 tablet 0  . Multiple Vitamins-Minerals (PRORENAL QD PO) Take 1 tablet by mouth daily.     Marland Kitchen oxyCODONE (ROXICODONE) 5 MG immediate release tablet Take 0.5 tablets (2.5 mg total) by mouth every 6 (six) hours as needed for severe pain. 10 tablet 0  . sevelamer (RENVELA) 800 MG tablet Take 800 mg by mouth 3 (three) times daily with meals.      No current facility-administered medications on file prior to visit.       Physical exam:  Vitals:   12/20/16 1000  BP: 117/74  Pulse: 68  Resp: 18  Temp: (!) 97 F (36.1 C)  TempSrc: Oral  SpO2: 100%  Weight: 164 lb 12.8 oz (74.8 kg)  Height: 5\' 1"  (1.549 m)    Extremities: 2+ femoral and dorsalis pedis pulse left foot no prior scars, right leg occluded thigh graft  Neck: Left-sided dialysis catheter  Assessment: Patient has exhausted upper extremity access sites with multiple prior infections and failed grafts. She has also had a failed right thigh graft. She is currently dialyzing via left side catheter.  Plan: Placement of left thigh graft for long-term hemodialysis access. The patient could not decide on the OR date today. She will call us in the near future if she wishes to schedule this. Risks benefits possible palpitations and procedure details including but not limited to bleeding infection graft thrombosis ischemic steal all been explained the patient previously and she understands and agrees to proceed.  Ruta Hinds, MD [facility] Office: [phone] Pager: [phone]

## 2016-12-21 DIAGNOSIS — D509 Iron deficiency anemia, unspecified: Secondary | ICD-10-CM | POA: Diagnosis not present

## 2016-12-21 DIAGNOSIS — D631 Anemia in chronic kidney disease: Secondary | ICD-10-CM | POA: Diagnosis not present

## 2016-12-21 DIAGNOSIS — N186 End stage renal disease: Secondary | ICD-10-CM | POA: Diagnosis not present

## 2016-12-21 DIAGNOSIS — N2581 Secondary hyperparathyroidism of renal origin: Secondary | ICD-10-CM | POA: Diagnosis not present

## 2016-12-24 DIAGNOSIS — D631 Anemia in chronic kidney disease: Secondary | ICD-10-CM | POA: Diagnosis not present

## 2016-12-24 DIAGNOSIS — N2581 Secondary hyperparathyroidism of renal origin: Secondary | ICD-10-CM | POA: Diagnosis not present

## 2016-12-24 DIAGNOSIS — D509 Iron deficiency anemia, unspecified: Secondary | ICD-10-CM | POA: Diagnosis not present

## 2016-12-24 DIAGNOSIS — N186 End stage renal disease: Secondary | ICD-10-CM | POA: Diagnosis not present

## 2016-12-26 DIAGNOSIS — Z992 Dependence on renal dialysis: Secondary | ICD-10-CM | POA: Diagnosis not present

## 2016-12-26 DIAGNOSIS — D631 Anemia in chronic kidney disease: Secondary | ICD-10-CM | POA: Diagnosis not present

## 2016-12-26 DIAGNOSIS — N2581 Secondary hyperparathyroidism of renal origin: Secondary | ICD-10-CM | POA: Diagnosis not present

## 2016-12-26 DIAGNOSIS — D509 Iron deficiency anemia, unspecified: Secondary | ICD-10-CM | POA: Diagnosis not present

## 2016-12-26 DIAGNOSIS — N039 Chronic nephritic syndrome with unspecified morphologic changes: Secondary | ICD-10-CM | POA: Diagnosis not present

## 2016-12-26 DIAGNOSIS — N186 End stage renal disease: Secondary | ICD-10-CM | POA: Diagnosis not present

## 2016-12-28 ENCOUNTER — Other Ambulatory Visit: Payer: Self-pay | Admitting: *Deleted

## 2016-12-28 DIAGNOSIS — D509 Iron deficiency anemia, unspecified: Secondary | ICD-10-CM | POA: Diagnosis not present

## 2016-12-28 DIAGNOSIS — N186 End stage renal disease: Secondary | ICD-10-CM | POA: Diagnosis not present

## 2016-12-28 DIAGNOSIS — N2581 Secondary hyperparathyroidism of renal origin: Secondary | ICD-10-CM | POA: Diagnosis not present

## 2016-12-28 DIAGNOSIS — D631 Anemia in chronic kidney disease: Secondary | ICD-10-CM | POA: Diagnosis not present

## 2016-12-31 ENCOUNTER — Ambulatory Visit (INDEPENDENT_AMBULATORY_CARE_PROVIDER_SITE_OTHER): Payer: Medicare Other | Admitting: *Deleted

## 2016-12-31 DIAGNOSIS — N2581 Secondary hyperparathyroidism of renal origin: Secondary | ICD-10-CM | POA: Diagnosis not present

## 2016-12-31 DIAGNOSIS — N186 End stage renal disease: Secondary | ICD-10-CM | POA: Diagnosis not present

## 2016-12-31 DIAGNOSIS — I639 Cerebral infarction, unspecified: Secondary | ICD-10-CM

## 2016-12-31 DIAGNOSIS — D509 Iron deficiency anemia, unspecified: Secondary | ICD-10-CM | POA: Diagnosis not present

## 2016-12-31 DIAGNOSIS — D631 Anemia in chronic kidney disease: Secondary | ICD-10-CM | POA: Diagnosis not present

## 2016-12-31 NOTE — Progress Notes (Signed)
Carelink Summary Report / Loop Recorder 

## 2017-01-01 ENCOUNTER — Other Ambulatory Visit: Payer: Self-pay

## 2017-01-01 ENCOUNTER — Encounter (HOSPITAL_COMMUNITY): Payer: Self-pay | Admitting: *Deleted

## 2017-01-01 LAB — CUP PACEART REMOTE DEVICE CHECK
Implantable Pulse Generator Implant Date: 20180806
MDC IDC SESS DTM: 20181104210848

## 2017-01-02 DIAGNOSIS — D631 Anemia in chronic kidney disease: Secondary | ICD-10-CM | POA: Diagnosis not present

## 2017-01-02 DIAGNOSIS — N186 End stage renal disease: Secondary | ICD-10-CM | POA: Diagnosis not present

## 2017-01-02 DIAGNOSIS — D509 Iron deficiency anemia, unspecified: Secondary | ICD-10-CM | POA: Diagnosis not present

## 2017-01-02 DIAGNOSIS — N2581 Secondary hyperparathyroidism of renal origin: Secondary | ICD-10-CM | POA: Diagnosis not present

## 2017-01-02 NOTE — Progress Notes (Signed)
Anesthesia Chart Review:  Pt is a same day work up.   Pt is a 61 year old female scheduled for insertion of AV gore-tex graft L thigh on 01/03/2017 with Harold Barban, MD  - Primary care at the [facility] is Fransico Him, MD. Last office visit 07/12/16  PMH includes:  Aortic valve endocarditis (s/p AV replacement 02/25/16), TIA (10/04/16,09/28/16, 05/16/16), ESRD on hemodialysis, GERD. Never smoker.  ED visit 10/04/16 for TIA  - Hospitalized 8/3-7/18 for TIA. Loop recorder implanted 10/01/16  - Hospitalized 3/21-22/18 for TIA  - Hospitalized 02/15/16-03/06/16 for infected AV dialysis graft, aortic valve endocarditis, septic shock. S/p AVR.   Medications include: plavix, pepcid, midodrine. Pt to hold plavix 2 days before surgery  Labs will be obtained day of surgery  CXR 05/16/16: No active cardiopulmonary disease.  EKG 11/09/16: Unusual P axis, possible ectopic atrial rhythm. ST & T wave abnormality, consider inferior ischemia.   TEE 10/01/16:  - Left ventricle: There was mild concentric hypertrophy. Systolic function was normal. The estimated ejection fraction was in the range of 60% to 65%. Wall motion was normal; there were no regional wall motion abnormalities. - Aortic valve: No evidence of vegetation. - Mitral valve: There was mild regurgitation. - Left atrium: The atrium was dilated. No evidence of thrombus in the atrial cavity or appendage. - Right atrium: Dialysis catheter tip noted, no thrombus. The atrium was dilated. No evidence of thrombus in the atrial cavity or appendage. - Atrial septum: Very small PFO by evidence of a few saline microbubbles crossing into the left atrium. - Tricuspid valve: Moderate regurgitation. RVSP is 43 mmHg + RAP. - Impressions: Very small PFO is noted with a few saline microbubbles seen early in the left atrium. LVEF 60-65%.  Carotid duplex 09/29/16:  - Bilateral: mild diffuse mixed plaque CCA and origin and proximal ICA and  ECA. 1-39% ICA plaquing.  - Vertebral artery flow is antegrade.  R/L heart cath 02/24/16:  1. No angiographic evidence of CAD 2. Normal PA pressure  If labs acceptable day of surgery, I anticipate pt can proceed with surgery as scheduled.    Willeen Cass, FNP-BC [facility]/Anesthesiology Phone: [phone] 01/02/2017 1:16 PM

## 2017-01-03 ENCOUNTER — Other Ambulatory Visit: Payer: Self-pay

## 2017-01-03 ENCOUNTER — Encounter (HOSPITAL_COMMUNITY): Payer: Self-pay | Admitting: Certified Registered Nurse Anesthetist

## 2017-01-03 ENCOUNTER — Encounter (HOSPITAL_COMMUNITY)
Admission: RE | Disposition: A | Discharge status: Home / Self Care | Payer: Self-pay | Source: Ambulatory Visit | Attending: Surgery

## 2017-01-03 ENCOUNTER — Inpatient Hospital Stay (HOSPITAL_COMMUNITY)
Admission: RE | Admit: 2017-01-03 | Discharge: 2017-01-04 | DRG: 673 | Disposition: A | Discharge status: Home / Self Care | Payer: Medicare Other | Source: Ambulatory Visit | Attending: Surgery | Admitting: Surgery

## 2017-01-03 ENCOUNTER — Ambulatory Visit (HOSPITAL_COMMUNITY): Payer: Medicare Other | Admitting: Emergency Medicine

## 2017-01-03 DIAGNOSIS — M109 Gout, unspecified: Secondary | ICD-10-CM | POA: Diagnosis not present

## 2017-01-03 DIAGNOSIS — Z952 Presence of prosthetic heart valve: Secondary | ICD-10-CM

## 2017-01-03 DIAGNOSIS — Z79899 Other long term (current) drug therapy: Secondary | ICD-10-CM | POA: Diagnosis not present

## 2017-01-03 DIAGNOSIS — M810 Age-related osteoporosis without current pathological fracture: Secondary | ICD-10-CM | POA: Diagnosis present

## 2017-01-03 DIAGNOSIS — Z7902 Long term (current) use of antithrombotics/antiplatelets: Secondary | ICD-10-CM | POA: Diagnosis not present

## 2017-01-03 DIAGNOSIS — Z992 Dependence on renal dialysis: Secondary | ICD-10-CM | POA: Diagnosis not present

## 2017-01-03 DIAGNOSIS — I120 Hypertensive chronic kidney disease with stage 5 chronic kidney disease or end stage renal disease: Principal | ICD-10-CM | POA: Diagnosis present

## 2017-01-03 DIAGNOSIS — N2581 Secondary hyperparathyroidism of renal origin: Secondary | ICD-10-CM | POA: Diagnosis present

## 2017-01-03 DIAGNOSIS — Z91041 Radiographic dye allergy status: Secondary | ICD-10-CM

## 2017-01-03 DIAGNOSIS — Z8249 Family history of ischemic heart disease and other diseases of the circulatory system: Secondary | ICD-10-CM

## 2017-01-03 DIAGNOSIS — K219 Gastro-esophageal reflux disease without esophagitis: Secondary | ICD-10-CM | POA: Diagnosis not present

## 2017-01-03 DIAGNOSIS — Z6831 Body mass index (BMI) 31.0-31.9, adult: Secondary | ICD-10-CM

## 2017-01-03 DIAGNOSIS — D631 Anemia in chronic kidney disease: Secondary | ICD-10-CM | POA: Diagnosis present

## 2017-01-03 DIAGNOSIS — N186 End stage renal disease: Secondary | ICD-10-CM | POA: Diagnosis not present

## 2017-01-03 DIAGNOSIS — Z888 Allergy status to other drugs, medicaments and biological substances status: Secondary | ICD-10-CM

## 2017-01-03 DIAGNOSIS — E8889 Other specified metabolic disorders: Secondary | ICD-10-CM | POA: Diagnosis present

## 2017-01-03 DIAGNOSIS — I69320 Aphasia following cerebral infarction: Secondary | ICD-10-CM | POA: Diagnosis not present

## 2017-01-03 DIAGNOSIS — I129 Hypertensive chronic kidney disease with stage 1 through stage 4 chronic kidney disease, or unspecified chronic kidney disease: Secondary | ICD-10-CM | POA: Diagnosis not present

## 2017-01-03 DIAGNOSIS — G459 Transient cerebral ischemic attack, unspecified: Secondary | ICD-10-CM | POA: Diagnosis not present

## 2017-01-03 DIAGNOSIS — I2510 Atherosclerotic heart disease of native coronary artery without angina pectoris: Secondary | ICD-10-CM | POA: Diagnosis not present

## 2017-01-03 HISTORY — DX: Carpal tunnel syndrome, unspecified upper limb: G56.00

## 2017-01-03 HISTORY — DX: Unspecified osteoarthritis, unspecified site: M19.90

## 2017-01-03 HISTORY — PX: AV FISTULA PLACEMENT: SHX1204

## 2017-01-03 HISTORY — DX: Anemia, unspecified: D64.9

## 2017-01-03 HISTORY — DX: Transient cerebral ischemic attack, unspecified: G45.9

## 2017-01-03 HISTORY — DX: Personal history of other medical treatment: Z92.89

## 2017-01-03 LAB — BASIC METABOLIC PANEL
ANION GAP: 12 (ref 5–15)
BUN: 6 mg/dL (ref 6–20)
CALCIUM: 9.2 mg/dL (ref 8.9–10.3)
CO2: 24 mmol/L (ref 22–32)
Chloride: 101 mmol/L (ref 101–111)
Creatinine, Ser: 4.16 mg/dL — ABNORMAL HIGH (ref 0.44–1.00)
GFR calc Af Amer: 12 mL/min — ABNORMAL LOW (ref >60)
GFR calc non Af Amer: 11 mL/min — ABNORMAL LOW (ref >60)
GLUCOSE: 138 mg/dL — AB (ref 65–99)
Potassium: 3.8 mmol/L (ref 3.5–5.1)
Sodium: 137 mmol/L (ref 135–145)

## 2017-01-03 LAB — CBC
HCT: 35.8 % — ABNORMAL LOW (ref 36.0–46.0)
HEMOGLOBIN: 11.4 g/dL — AB (ref 12.0–15.0)
MCH: 27.7 pg (ref 26.0–34.0)
MCHC: 31.8 g/dL (ref 30.0–36.0)
MCV: 86.9 fL (ref 78.0–100.0)
PLATELETS: 221 10*3/uL (ref 150–400)
RBC: 4.12 MIL/uL (ref 3.87–5.11)
RDW: 17.6 % — ABNORMAL HIGH (ref 11.5–15.5)
WBC: 6.7 10*3/uL (ref 4.0–10.5)

## 2017-01-03 LAB — POCT I-STAT 4, (NA,K, GLUC, HGB,HCT)
GLUCOSE: 96 mg/dL (ref 65–99)
HEMATOCRIT: 33 % — AB (ref 36.0–46.0)
HEMOGLOBIN: 11.2 g/dL — AB (ref 12.0–15.0)
POTASSIUM: 4 mmol/L (ref 3.5–5.1)
Sodium: 139 mmol/L (ref 135–145)

## 2017-01-03 LAB — PROTIME-INR
INR: 1.08
PROTHROMBIN TIME: 13.9 s (ref 11.4–15.2)

## 2017-01-03 LAB — POTASSIUM: POTASSIUM: 4.2 mmol/L (ref 3.5–5.1)

## 2017-01-03 LAB — MRSA PCR SCREENING: MRSA by PCR: NEGATIVE

## 2017-01-03 SURGERY — INSERTION OF ARTERIOVENOUS (AV) GORE-TEX GRAFT THIGH
Anesthesia: General | Site: Leg Upper | Laterality: Left

## 2017-01-03 MED ORDER — PROTAMINE SULFATE 10 MG/ML IV SOLN
INTRAVENOUS | Status: DC | PRN
  Administered 2017-01-03: 40 mg via INTRAVENOUS
  Administered 2017-01-03: 10 mg via INTRAVENOUS

## 2017-01-03 MED ORDER — PANTOPRAZOLE SODIUM 40 MG PO TBEC
40.0000 mg | DELAYED_RELEASE_TABLET | Freq: Every day | ORAL | Status: DC
  Administered 2017-01-03 – 2017-01-04 (×2): 40 mg via ORAL
  Filled 2017-01-03 (×2): qty 1

## 2017-01-03 MED ORDER — ENOXAPARIN SODIUM 30 MG/0.3ML ~~LOC~~ SOLN: 30.0000 mg | SUBCUTANEOUS | Status: DC

## 2017-01-03 MED ORDER — SODIUM CHLORIDE 0.9% FLUSH: 3.0000 mL | INTRAVENOUS | Status: DC | PRN

## 2017-01-03 MED ORDER — CINACALCET HCL 30 MG PO TABS
180.0000 mg | ORAL_TABLET | ORAL | Status: DC
  Administered 2017-01-04: 180 mg via ORAL
  Filled 2017-01-03: qty 6

## 2017-01-03 MED ORDER — IPRATROPIUM BROMIDE 0.06 % NA SOLN
2.0000 | Freq: Three times a day (TID) | NASAL | Status: DC | PRN
  Filled 2017-01-03: qty 15

## 2017-01-03 MED ORDER — MEPERIDINE HCL 25 MG/ML IJ SOLN: 6.2500 mg | INTRAMUSCULAR | Status: DC | PRN

## 2017-01-03 MED ORDER — ACETAMINOPHEN 650 MG RE SUPP: 325.0000 mg | RECTAL | Status: DC | PRN

## 2017-01-03 MED ORDER — MIDAZOLAM HCL 2 MG/2ML IJ SOLN: 0.5000 mg | Freq: Once | INTRAMUSCULAR | Status: DC | PRN

## 2017-01-03 MED ORDER — ACETAMINOPHEN 325 MG PO TABS: 325.0000 mg | ORAL_TABLET | ORAL | Status: DC | PRN

## 2017-01-03 MED ORDER — ALUM & MAG HYDROXIDE-SIMETH 200-200-20 MG/5ML PO SUSP: 15.0000 mL | ORAL | Status: DC | PRN

## 2017-01-03 MED ORDER — SEVELAMER CARBONATE 800 MG PO TABS
800.0000 mg | ORAL_TABLET | Freq: Three times a day (TID) | ORAL | Status: DC
  Administered 2017-01-03 – 2017-01-04 (×2): 800 mg via ORAL
  Filled 2017-01-03 (×2): qty 1

## 2017-01-03 MED ORDER — LABETALOL HCL 5 MG/ML IV SOLN: 10.0000 mg | INTRAVENOUS | Status: DC | PRN

## 2017-01-03 MED ORDER — HYDRALAZINE HCL 20 MG/ML IJ SOLN: 5.0000 mg | INTRAMUSCULAR | Status: DC | PRN

## 2017-01-03 MED ORDER — FENTANYL CITRATE (PF) 250 MCG/5ML IJ SOLN
INTRAMUSCULAR | Status: AC
  Filled 2017-01-03: qty 5

## 2017-01-03 MED ORDER — NEPRO/CARBSTEADY PO LIQD
237.0000 mL | Freq: Two times a day (BID) | ORAL | Status: DC
  Filled 2017-01-03 (×4): qty 237

## 2017-01-03 MED ORDER — SODIUM CHLORIDE 0.9 % IV SOLN
INTRAVENOUS | Status: DC | PRN
  Administered 2017-01-03: 500 mL

## 2017-01-03 MED ORDER — ONDANSETRON HCL 4 MG/2ML IJ SOLN
INTRAMUSCULAR | Status: AC
  Filled 2017-01-03: qty 2

## 2017-01-03 MED ORDER — RENA-VITE PO TABS
1.0000 | ORAL_TABLET | Freq: Every day | ORAL | Status: DC
  Administered 2017-01-03: 1 via ORAL
  Filled 2017-01-03: qty 1

## 2017-01-03 MED ORDER — ALBUMIN HUMAN 5 % IV SOLN
INTRAVENOUS | Status: DC | PRN
  Administered 2017-01-03: 10:00:00 via INTRAVENOUS

## 2017-01-03 MED ORDER — CINACALCET HCL 30 MG PO TABS: 120.0000 mg | ORAL_TABLET | ORAL | Status: DC

## 2017-01-03 MED ORDER — PHENOL 1.4 % MT LIQD: 1.0000 | OROMUCOSAL | Status: DC | PRN

## 2017-01-03 MED ORDER — CHLORHEXIDINE GLUCONATE 4 % EX LIQD: 60.0000 mL | Freq: Once | CUTANEOUS | Status: DC

## 2017-01-03 MED ORDER — PROPOFOL 10 MG/ML IV BOLUS
INTRAVENOUS | Status: AC
  Filled 2017-01-03: qty 20

## 2017-01-03 MED ORDER — POTASSIUM CHLORIDE CRYS ER 20 MEQ PO TBCR: 20.0000 meq | EXTENDED_RELEASE_TABLET | Freq: Once | ORAL | Status: DC

## 2017-01-03 MED ORDER — OXYCODONE HCL 5 MG PO TABS
2.5000 mg | ORAL_TABLET | ORAL | Status: DC | PRN
  Administered 2017-01-03: 5 mg via ORAL
  Filled 2017-01-03: qty 1

## 2017-01-03 MED ORDER — MIDODRINE HCL 5 MG PO TABS
10.0000 mg | ORAL_TABLET | Freq: Two times a day (BID) | ORAL | Status: DC | PRN
  Administered 2017-01-04: 10 mg via ORAL

## 2017-01-03 MED ORDER — VANCOMYCIN HCL 1000 MG IV SOLR
INTRAVENOUS | Status: DC | PRN
  Administered 2017-01-03: 1000 mg via INTRAVENOUS

## 2017-01-03 MED ORDER — PHENYLEPHRINE 40 MCG/ML (10ML) SYRINGE FOR IV PUSH (FOR BLOOD PRESSURE SUPPORT)
PREFILLED_SYRINGE | INTRAVENOUS | Status: AC
  Filled 2017-01-03: qty 10

## 2017-01-03 MED ORDER — SODIUM CHLORIDE 0.9% FLUSH
3.0000 mL | Freq: Two times a day (BID) | INTRAVENOUS | Status: DC
  Administered 2017-01-03: 3 mL via INTRAVENOUS

## 2017-01-03 MED ORDER — HEMOSTATIC AGENTS (NO CHARGE) OPTIME
TOPICAL | Status: DC | PRN
  Administered 2017-01-03: 1 via TOPICAL

## 2017-01-03 MED ORDER — CLOPIDOGREL BISULFATE 75 MG PO TABS
75.0000 mg | ORAL_TABLET | Freq: Every day | ORAL | Status: DC
  Administered 2017-01-03 – 2017-01-04 (×2): 75 mg via ORAL
  Filled 2017-01-03 (×2): qty 1

## 2017-01-03 MED ORDER — HEPARIN SODIUM (PORCINE) 1000 UNIT/ML IJ SOLN
INTRAMUSCULAR | Status: DC | PRN
  Administered 2017-01-03: 7000 [IU] via INTRAVENOUS

## 2017-01-03 MED ORDER — BISACODYL 5 MG PO TBEC: 5.0000 mg | DELAYED_RELEASE_TABLET | Freq: Every day | ORAL | Status: DC | PRN

## 2017-01-03 MED ORDER — FENTANYL CITRATE (PF) 100 MCG/2ML IJ SOLN
INTRAMUSCULAR | Status: DC | PRN
  Administered 2017-01-03 (×7): 50 ug via INTRAVENOUS

## 2017-01-03 MED ORDER — PROPOFOL 10 MG/ML IV BOLUS
INTRAVENOUS | Status: DC | PRN
  Administered 2017-01-03: 120 mg via INTRAVENOUS
  Administered 2017-01-03: 40 mg via INTRAVENOUS

## 2017-01-03 MED ORDER — GUAIFENESIN-DM 100-10 MG/5ML PO SYRP: 15.0000 mL | ORAL_SOLUTION | ORAL | Status: DC | PRN

## 2017-01-03 MED ORDER — DEXAMETHASONE SODIUM PHOSPHATE 10 MG/ML IJ SOLN
INTRAMUSCULAR | Status: DC | PRN
  Administered 2017-01-03: 5 mg via INTRAVENOUS

## 2017-01-03 MED ORDER — ONDANSETRON HCL 4 MG/2ML IJ SOLN
INTRAMUSCULAR | Status: DC | PRN
  Administered 2017-01-03: 4 mg via INTRAVENOUS

## 2017-01-03 MED ORDER — SENNOSIDES-DOCUSATE SODIUM 8.6-50 MG PO TABS: 1.0000 | ORAL_TABLET | Freq: Every evening | ORAL | Status: DC | PRN

## 2017-01-03 MED ORDER — PROMETHAZINE HCL 25 MG/ML IJ SOLN
6.2500 mg | INTRAMUSCULAR | Status: DC | PRN
Start: 2017-01-03 — End: 2017-01-03

## 2017-01-03 MED ORDER — MORPHINE SULFATE (PF) 2 MG/ML IV SOLN: 1.0000 mg | INTRAVENOUS | Status: DC | PRN

## 2017-01-03 MED ORDER — FENTANYL CITRATE (PF) 100 MCG/2ML IJ SOLN: 25.0000 ug | INTRAMUSCULAR | Status: DC | PRN

## 2017-01-03 MED ORDER — FAMOTIDINE 20 MG PO TABS
20.0000 mg | ORAL_TABLET | Freq: Two times a day (BID) | ORAL | Status: DC
  Administered 2017-01-03 (×2): 20 mg via ORAL
  Filled 2017-01-03 (×2): qty 1

## 2017-01-03 MED ORDER — VANCOMYCIN HCL IN DEXTROSE 1-5 GM/200ML-% IV SOLN
INTRAVENOUS | Status: AC
  Filled 2017-01-03: qty 200

## 2017-01-03 MED ORDER — 0.9 % SODIUM CHLORIDE (POUR BTL) OPTIME
TOPICAL | Status: DC | PRN
  Administered 2017-01-03: 1000 mL

## 2017-01-03 MED ORDER — ONDANSETRON HCL 4 MG/2ML IJ SOLN: 4.0000 mg | Freq: Four times a day (QID) | INTRAMUSCULAR | Status: DC | PRN

## 2017-01-03 MED ORDER — LIDOCAINE HCL (CARDIAC) 20 MG/ML IV SOLN
INTRAVENOUS | Status: DC | PRN
  Administered 2017-01-03: 20 mg via INTRAVENOUS

## 2017-01-03 MED ORDER — PHENYLEPHRINE HCL 10 MG/ML IJ SOLN
INTRAVENOUS | Status: DC | PRN
  Administered 2017-01-03: 50 ug/min via INTRAVENOUS

## 2017-01-03 MED ORDER — SODIUM CHLORIDE 0.9 % IV SOLN: 250.0000 mL | INTRAVENOUS | Status: DC | PRN

## 2017-01-03 MED ORDER — FLUTICASONE PROPIONATE 50 MCG/ACT NA SUSP
2.0000 | Freq: Every day | NASAL | Status: DC | PRN
  Filled 2017-01-03: qty 16

## 2017-01-03 MED ORDER — METOPROLOL TARTRATE 5 MG/5ML IV SOLN: 2.0000 mg | INTRAVENOUS | Status: DC | PRN

## 2017-01-03 MED ORDER — SODIUM CHLORIDE 0.9 % IV SOLN
INTRAVENOUS | Status: DC
  Administered 2017-01-03 (×2): via INTRAVENOUS

## 2017-01-03 MED ORDER — EPHEDRINE SULFATE 50 MG/ML IJ SOLN
INTRAMUSCULAR | Status: DC | PRN
  Administered 2017-01-03 (×3): 10 mg via INTRAVENOUS
  Administered 2017-01-03: 15 mg via INTRAVENOUS
  Administered 2017-01-03: 5 mg via INTRAVENOUS

## 2017-01-03 MED ORDER — CALCITRIOL 0.5 MCG PO CAPS
1.0000 ug | ORAL_CAPSULE | ORAL | Status: DC
  Administered 2017-01-04: 1 ug via ORAL

## 2017-01-03 SURGICAL SUPPLY — 35 items
CANISTER SUCT 3000ML PPV (MISCELLANEOUS) ×2 IMPLANT
CLIP VESOCCLUDE MED 6/CT (CLIP) ×2 IMPLANT
CLIP VESOCCLUDE SM WIDE 6/CT (CLIP) ×2 IMPLANT
DERMABOND ADVANCED (GAUZE/BANDAGES/DRESSINGS) ×1
DERMABOND ADVANCED .7 DNX12 (GAUZE/BANDAGES/DRESSINGS) ×1 IMPLANT
DRAPE INCISE IOBAN 66X45 STRL (DRAPES) ×2 IMPLANT
DRAPE PROXIMA HALF (DRAPES) ×4 IMPLANT
DRAPE UTILITY 15X26 TOWEL STRL (DRAPES) ×2 IMPLANT
ELECT REM PT RETURN 9FT ADLT (ELECTROSURGICAL) ×2
ELECTRODE REM PT RTRN 9FT ADLT (ELECTROSURGICAL) ×1 IMPLANT
GLOVE BIOGEL PI IND STRL 6.5 (GLOVE) ×2 IMPLANT
GLOVE BIOGEL PI IND STRL 7.5 (GLOVE) ×1 IMPLANT
GLOVE BIOGEL PI INDICATOR 6.5 (GLOVE) ×2
GLOVE BIOGEL PI INDICATOR 7.5 (GLOVE) ×1
GLOVE ECLIPSE 6.5 STRL STRAW (GLOVE) ×2 IMPLANT
GLOVE SURG SS PI 7.5 STRL IVOR (GLOVE) ×2 IMPLANT
GOWN STRL REUS W/ TWL LRG LVL3 (GOWN DISPOSABLE) ×2 IMPLANT
GOWN STRL REUS W/ TWL XL LVL3 (GOWN DISPOSABLE) ×1 IMPLANT
GOWN STRL REUS W/TWL LRG LVL3 (GOWN DISPOSABLE) ×2
GOWN STRL REUS W/TWL XL LVL3 (GOWN DISPOSABLE) ×1
GRAFT GORETEX STRT 6X50 (Vascular Products) ×2 IMPLANT
HEMOSTAT SNOW SURGICEL 2X4 (HEMOSTASIS) ×2 IMPLANT
KIT BASIN OR (CUSTOM PROCEDURE TRAY) ×2 IMPLANT
KIT ROOM TURNOVER OR (KITS) ×2 IMPLANT
NS IRRIG 1000ML POUR BTL (IV SOLUTION) ×2 IMPLANT
PACK CV ACCESS (CUSTOM PROCEDURE TRAY) ×2 IMPLANT
PAD ARMBOARD 7.5X6 YLW CONV (MISCELLANEOUS) ×4 IMPLANT
SUT PROLENE 6 0 BV (SUTURE) ×4 IMPLANT
SUT SILK 2 0 SH (SUTURE) ×2 IMPLANT
SUT VIC AB 2-0 CT1 36 (SUTURE) ×2 IMPLANT
SUT VIC AB 3-0 SH 27 (SUTURE) ×2
SUT VIC AB 3-0 SH 27X BRD (SUTURE) ×2 IMPLANT
SUT VICRYL 4-0 PS2 18IN ABS (SUTURE) ×4 IMPLANT
UNDERPAD 30X30 (UNDERPADS AND DIAPERS) ×2 IMPLANT
WATER STERILE IRR 1000ML POUR (IV SOLUTION) ×2 IMPLANT

## 2017-01-03 NOTE — Anesthesia Postprocedure Evaluation (Signed)
Anesthesia Post Note  Patient: [name] Marks  Procedure(s) Performed: INSERTION OF ARTERIOVENOUS (AV) GORE-TEX GRAFT LEFT THIGH (Left Leg Upper)     Patient location during evaluation: PACU Anesthesia Type: General Level of consciousness: awake and sedated Pain management: pain level controlled Vital Signs Assessment: post-procedure vital signs reviewed and stable Respiratory status: spontaneous breathing, nonlabored ventilation, respiratory function stable and patient connected to nasal cannula oxygen Cardiovascular status: blood pressure returned to baseline and stable Postop Assessment: no apparent nausea or vomiting Anesthetic complications: no    Last Vitals:  Vitals:   01/03/17 1155 01/03/17 1229  BP: 116/60 123/65  Pulse: 81 81  Resp: (!) 8 18  Temp: 36.4 C 36.7 C  SpO2: 98% 98%    Last Pain:  Vitals:   01/03/17 1229  TempSrc: Oral  PainSc:                  Kaydence Menard,JAMES TERRILL

## 2017-01-03 NOTE — Progress Notes (Signed)
Dr. Jenita Seashore made aware of the K+ greater than 8.5. New orders received.

## 2017-01-03 NOTE — Consult Note (Signed)
[facility] Renal Consultation Note    Indication for Consultation:  Management of ESRD/hemodialysis; anemia, hypertension/volume and secondary hyperparathyroidism PCP:  HPI: [name] Marks is a 61 y.o. 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 PMH also significant for CAD, PVD, hx TIA/CVA aphasia, staph lugdenensis endocarditis s/p AVR.   She is admitted following placement of L thigh AVGG by Dr. Trula Slade this afternoon. She has history of previous failed access in upper extremities, most recently R arm AVG removed 02/17/16 d/t infection.  Seen in room, tired after surgery, "I've been better". No acute complaints at this time, getting ready to eat lunch. Denies CP, SOB, abdominal pain, N,V,D.   Dialyzes at [facility] MWF. Using L IJ TDC. Denies any issues on dialysis recently. Compliant with treatments. Last HD Wednesday.   Past Medical History:  Diagnosis Date  . Carpal tunnel syndrome   . Complication of anesthesia    01/01/17- '"aong time ago, difficulty breathimg, not sure if it was due to anesthesia or not."  . End stage renal disease on dialysis ([place])    MWF  . GERD (gastroesophageal reflux disease)   . Gout   . Osteoporosis 09/2016   T score -2.6  . Septic shock ([place])   . Staphylococcus aureus bacteremia   . Stroke [facility]) 09/2016   TIA- no residual effects   Past Surgical History:  Procedure Laterality Date  . BREAST BIOPSY     stereo left 2013  . BREAST BIOPSY     stereo right 2011  . COLONOSCOPY W/ POLYPECTOMY    . DG AV DIALYSIS GRAFT DECLOT OR    . TUBAL LIGATION  1983   Family History  Problem Relation Age of Onset  . Diabetes Mother   . Hypertension Mother   . Heart disease Mother   . Alcohol abuse Mother   . Kidney disease Mother   . Cancer Father        COLON  . Alcohol abuse Father   . Breast cancer Sister 62  . Hypertension Sister   . Kidney disease Sister   . Hypertension Son    . Kidney disease Son    Social History:  reports that  has never smoked. she has never used smokeless tobacco. She reports that she does not drink alcohol or use drugs. Allergies  Allergen Reactions  . Contrast Media [Iodinated Diagnostic Agents] Anaphylaxis  . Ancef [Cefazolin] Other (See Comments)    Pt states she can not tolerate  . Naproxen Sodium Itching   Prior to Admission medications   Medication Sig Start Date End Date Taking? Authorizing Provider  cinacalcet (SENSIPAR) 60 MG tablet Take 120 mg by mouth every Monday, Wednesday, and Friday with hemodialysis.    Yes [provider]  clopidogrel (PLAVIX) 75 MG tablet Take 1 tablet (75 mg total) by mouth daily. 11/30/16  Yes Mikell, Jeani Sow, MD  famotidine (PEPCID) 20 MG tablet Take 20 mg by mouth 2 (two) times daily. Lunch and dinner   Yes [provider]  fluticasone (FLONASE) 50 MCG/ACT nasal spray Place 2 sprays into both nostrils daily. Patient taking differently: Place 2 sprays into both nostrils daily as needed for allergies.  11/24/16  Yes Tereasa Coop, PA-C  ipratropium (ATROVENT) 0.06 % nasal spray Place 2 sprays into both nostrils 4 (four) times daily. Patient taking differently: Place 2 sprays into both nostrils 3 (three) times daily as needed for rhinitis.  11/24/16  Yes Philis Fendt  L, PA-C  midodrine (PROAMATINE) 10 MG tablet Take 1 tablet (10 mg total) by mouth 2 (two) times daily as needed (30 min prior to HD). 08/27/16  Yes Mikell, Jeani Sow, MD  Multiple Vitamins-Minerals (PRORENAL QD PO) Take 1 tablet by mouth daily.    Yes [provider]  sevelamer (RENVELA) 800 MG tablet Take 800 mg by mouth 3 (three) times daily with meals.    Yes [provider]  oxyCODONE (ROXICODONE) 5 MG immediate release tablet Take 0.5 tablets (2.5 mg total) by mouth every 6 (six) hours as needed for severe pain. 10/28/16   Fatima Blank, MD   Current Facility-Administered Medications   Medication Dose Route Frequency Provider Last Rate Last Dose  . 0.9 %  sodium chloride infusion  250 mL Intravenous PRN Laurence Slate M, PA-C      . acetaminophen (TYLENOL) tablet 325-650 mg  325-650 mg Oral Q4H PRN Laurence Slate M, PA-C       Or  . acetaminophen (TYLENOL) suppository 325-650 mg  325-650 mg Rectal Q4H PRN Ulyses Amor, PA-C      . alum & mag hydroxide-simeth (MAALOX/MYLANTA) 200-200-20 MG/5ML suspension 15-30 mL  15-30 mL Oral Q2H PRN Laurence Slate M, PA-C      . bisacodyl (DULCOLAX) EC tablet 5 mg  5 mg Oral Daily PRN Laurence Slate M, PA-C      . [START ON 01/04/2017] cinacalcet (SENSIPAR) tablet 120 mg  120 mg Oral Q M,W,F-HD Laurence Slate M, PA-C      . clopidogrel (PLAVIX) tablet 75 mg  75 mg Oral Daily Laurence Slate M, [place]      . [START ON 01/04/2017] enoxaparin (LOVENOX) injection 30 mg  30 mg Subcutaneous Q24H Collins, Emma M, PA-C      . famotidine (PEPCID) tablet 20 mg  20 mg Oral BID Theda Sers, Emma M, PA-C      . fluticasone (FLONASE) 50 MCG/ACT nasal spray 2 spray  2 spray Each Nare Daily PRN Laurence Slate M, PA-C      . guaiFENesin-dextromethorphan (ROBITUSSIN DM) 100-10 MG/5ML syrup 15 mL  15 mL Oral Q4H PRN Laurence Slate M, PA-C      . hydrALAZINE (APRESOLINE) injection 5 mg  5 mg Intravenous Q20 Min PRN Theda Sers, Emma M, PA-C      . ipratropium (ATROVENT) 0.06 % nasal spray 2 spray  2 spray Each Nare TID PRN Ulyses Amor, PA-C      . labetalol (NORMODYNE,TRANDATE) injection 10 mg  10 mg Intravenous Q10 min PRN Laurence Slate M, PA-C      . metoprolol tartrate (LOPRESSOR) injection 2-5 mg  2-5 mg Intravenous Q2H PRN Laurence Slate M, PA-C      . midodrine (PROAMATINE) tablet 10 mg  10 mg Oral BID PRN Laurence Slate M, PA-C      . morphine 2 MG/ML injection 1-2 mg  1-2 mg Intravenous Q1H PRN Laurence Slate M, PA-C      . ondansetron [facility]) injection 4 mg  4 mg Intravenous Q6H PRN Laurence Slate M, PA-C      . oxyCODONE (Oxy IR/ROXICODONE) immediate release tablet  2.5-5 mg  2.5-5 mg Oral Q4H PRN Laurence Slate M, PA-C      . pantoprazole (PROTONIX) EC tablet 40 mg  40 mg Oral Daily Collins, Emma M, PA-C      . phenol (CHLORASEPTIC) mouth spray 1 spray  1 spray Mouth/Throat PRN Laurence Slate M, PA-C      . potassium  chloride SA (K-DUR,KLOR-CON) CR tablet 20-40 mEq  20-40 mEq Oral [place], Emma M, PA-C      . senna-docusate (Senokot-S) tablet 1 tablet  1 tablet Oral QHS PRN Laurence Slate M, PA-C      . sevelamer carbonate (RENVELA) tablet 800 mg  800 mg Oral TID WC Collins, Emma M, PA-C      . sodium chloride flush (NS) 0.9 % injection 3 mL  3 mL Intravenous Q12H Collins, Emma M, PA-C      . sodium chloride flush (NS) 0.9 % injection 3 mL  3 mL Intravenous PRN Laurence Slate M, PA-C        ROS: As per HPI otherwise negative.  Physical Exam: Vitals:   01/03/17 1143 01/03/17 1150 01/03/17 1155 01/03/17 1229  BP: 118/60  116/60 123/65  Pulse: 84 80 81 81  Resp: (!) 26 (!) 5 (!) 8 18  Temp:   97.6 F (36.4 C) 98 F (36.7 C)  TempSrc:    Oral  SpO2: 97% 98% 98% 98%  Weight:    79.7 kg (175 lb 11.2 oz)  Height:         General: WDWN female NAD  Head: NCAT sclera not icteric MMM Neck: Supple. No JVD No masses Lungs: CTA bilaterally without wheezes, rales, or rhonchi. Breathing is unlabored. Heart: RRR with S1 S2 Abdomen: soft NT + BS Lower extremities: no LE edema  Neuro: A & O  X 3. Moves all extremities spontaneously. Psych:  Responds to questions appropriately with a normal affect. Dialysis Access: L IJ TDC, L thigh AVG +bruit   Labs: Basic Metabolic Panel: Recent Labs  Lab 01/03/17 0748 01/03/17 0805 01/03/17 0819  NA 130*  --  139  K >8.5* 4.2 4.0  GLUCOSE 101*  --  96   Liver Function Tests: No results for input(s): AST, ALT, ALKPHOS, BILITOT, PROT, ALBUMIN in the last 168 hours. No results for input(s): LIPASE, AMYLASE in the last 168 hours. No results for input(s): AMMONIA in the last 168 hours. CBC: Recent Labs  Lab  01/03/17 0748 01/03/17 0819  HGB 11.2* 11.2*  HCT 33.0* 33.0*   Cardiac Enzymes: No results for input(s): CKTOTAL, CKMB, CKMBINDEX, TROPONINI in the last 168 hours. CBG: No results for input(s): GLUCAP in the last 168 hours. Iron Studies: No results for input(s): IRON, TIBC, TRANSFERRIN, FERRITIN in the last 72 hours. Studies/Results: No results found.  Dialysis Orders:  [facility] MWF 4h 180F 400/A1.5x EDW 75kg 3K/2.5Ca Profile 2 Hep 6000 Bolus, 2500 mid run Mircera 106mcg IV q 4 wks (last 10/31) Calcitriol 1.0 mcg PO tiw Sensipar 180 mg PO tiw Renvela 1 q ac   Assessment/Plan: 1.  ESRD HD MWF - s/p L thigh AVGG insertion by Dr. Trula Slade today. No urgent HD needs today. Plan HD tomorrow on schedule  2.  Hypertension/volume  - BP stable on midodrine/No volume excess on exam, follow weights has been leaving below EDW as outpatient  3.  Anemia  - Hgb 11.2 No ESA needs currently  4.  Metabolic bone disease -  Ca ok /outpatient P at goal, Follow trend. Continue VDRA/binders/Sensipar  5.  Nutrition - Renal diet/vitamins  [name] PA-C [facility] Pager [phone] 01/03/2017, 12:58 PM

## 2017-01-03 NOTE — Transfer of Care (Signed)
Immediate Anesthesia Transfer of Care Note  Patient: [name] Marks  Procedure(s) Performed: INSERTION OF ARTERIOVENOUS (AV) GORE-TEX GRAFT LEFT THIGH (Left Leg Upper)  Patient Location: PACU  Anesthesia Type:General  Level of Consciousness: awake, alert , oriented and patient cooperative  Airway & Oxygen Therapy: Patient Spontanous Breathing  Post-op Assessment: Report given to RN and Post -op Vital signs reviewed and stable  Post vital signs: Reviewed and stable  Last Vitals:  Vitals:   01/03/17 0720  BP: (!) 141/61  Pulse: 61  Resp: 18  Temp: 36.7 C  SpO2: 100%    Last Pain:  Vitals:   01/03/17 0720  TempSrc: Oral      Patients Stated Pain Goal: 3 (88/11/03 1594)  Complications: No apparent anesthesia complications

## 2017-01-03 NOTE — Interval H&P Note (Signed)
History and Physical Interval Note:  01/03/2017 8:38 AM  [name] Marks  has presented today for surgery, with the diagnosis of end stage renal disease  The various methods of treatment have been discussed with the patient and family. After consideration of risks, benefits and other options for treatment, the patient has consented to  Procedure(s): INSERTION OF ARTERIOVENOUS (AV) GORE-TEX GRAFT THIGH (Left) as a surgical intervention .  The patient's history has been reviewed, patient examined, no change in status, stable for surgery.  I have reviewed the patient's chart and labs.  Questions were answered to the patient's satisfaction.     [name]

## 2017-01-03 NOTE — Anesthesia Procedure Notes (Signed)
Procedure Name: LMA Insertion Date/Time: 01/03/2017 9:29 AM Performed by: Inda Coke, CRNA Pre-anesthesia Checklist: Patient identified, Emergency Drugs available, Suction available and Patient being monitored Patient Re-evaluated:Patient Re-evaluated prior to induction Oxygen Delivery Method: Circle System Utilized Preoxygenation: Pre-oxygenation with 100% oxygen Induction Type: IV induction Ventilation: Mask ventilation without difficulty LMA: LMA inserted LMA Size: 4.0 Number of attempts: 1 Airway Equipment and Method: Bite block Placement Confirmation: positive ETCO2 Tube secured with: Tape Dental Injury: Teeth and Oropharynx as per pre-operative assessment

## 2017-01-03 NOTE — Anesthesia Preprocedure Evaluation (Addendum)
Anesthesia Evaluation  Patient identified by MRN, date of birth, ID band Patient awake    Reviewed: Allergy & Precautions, NPO status , Patient's Chart, lab work & pertinent test results  History of Anesthesia Complications Negative for: history of anesthetic complications  Airway Mallampati: II  TM Distance: >3 FB Neck ROM: Full    Dental  (+) Chipped, Missing, Dental Advisory Given   Pulmonary neg pulmonary ROS,    breath sounds clear to auscultation       Cardiovascular + Valvular Problems/Murmurs (s/p AVR)  Rhythm:Regular Rate:Normal  8/18 ECHO: EF 55-60%, mild MR, severe TR   Neuro/Psych TIAnegative neurological ROS     GI/Hepatic Neg liver ROS, GERD  Controlled,  Endo/Other  Morbid obesity  Renal/GU Dialysis and ESRFRenal disease (K+ 4.0)     Musculoskeletal   Abdominal (+) + obese,   Peds  Hematology negative hematology ROS (+)   Anesthesia Other Findings   Reproductive/Obstetrics                             Anesthesia Physical Anesthesia Plan  ASA: III  Anesthesia Plan: General   Post-op Pain Management:    Induction: Intravenous  PONV Risk Score and Plan: 2 and Ondansetron, Dexamethasone and Treatment may vary due to age or medical condition  Airway Management Planned: LMA  Additional Equipment:   Intra-op Plan:   Post-operative Plan:   Informed Consent: I have reviewed the patients History and Physical, chart, labs and discussed the procedure including the risks, benefits and alternatives for the proposed anesthesia with the patient or authorized representative who has indicated his/her understanding and acceptance.   Dental advisory given  Plan Discussed with: CRNA and Surgeon  Anesthesia Plan Comments: (Plan routine monitors, GA- LMA OK)       Anesthesia Quick Evaluation

## 2017-01-03 NOTE — Op Note (Signed)
    Patient name: Caryn A [name] MRN: [id number] DOB: Dec 01, 1955 Sex: female  01/03/2017 Pre-operative Diagnosis: End-stage renal disease Post-operative diagnosis:  Same Surgeon:  [name] Assistants: Gerri Lins Procedure:   Left thigh dialysis graft with 6 mm PTFE Anesthesia: General Blood Loss: 100 cc Specimens: None  Findings: The patient had an excellent saphenous vein.  I performed a end to end anastomosis to the saphenous vein approximately 1 cm beyond the saphenofemoral junction.  The arterial anastomosis was to the common femoral artery  Indications: Patient is here today for dialysis access in her left thigh.  Procedure:  The patient was identified in the holding area and taken to [place] [address]  The patient was then placed supine on the table. general anesthesia was administered.  The patient was prepped and draped in the usual sterile fashion.  A time out was called and antibiotics were administered.  A vertical incision was made in the left groin.  Cautery was used about subtenons tissue down to the femoral sheath which was opened sharply.  I dissected out the common femoral artery which was mildly calcified.  There was an excellent pulse.  I then dissected out the greater saphenous vein up to the saphenofemoral junction.  The vein measured approximately 4-5 mm.  I then made a counterincision in the distal thigh and used a curved tunneler to create a tunnel.  A 6 mm stretch PTFE graft was brought through the tunnel.  The patient was then fully heparinized.  After the heparin circulated a Henley clamp was placed proximally and distally on the common femoral artery.  An 11 blade was used to make an arteriotomy which was extended longitudinally with Potts scissors.  The graft was then beveled and a end to side anastomosis was created with running 6-0 Prolene.  Once the anastomosis was completed the clamps were released and there was excellent pulsatile flow through the graft.   Graft was then flushed with heparin saline and reoccluded.  I then ligated the saphenous vein distally.  I placed a Henley clamp at the saphenofemoral junction.  I spatulated the greater saphenous vein and cut the graft to the appropriate length.  It was then beveled.  A running end to end anastomosis was completed with 6-0 Prolene.  Prior to completion the appropriate flushing maneuvers were performed and the anastomosis was completed.  Once the clamps were released, there was an excellent thrill within the graft.  50 mg of protamine was then given.  Once hemostasis was satisfactory, the counterincision in the distal thigh was closed with 2 layers with 3-0 Vicryl.  The groin incision was then closed by reapproximating the femoral sheath with 2-0 Vicryl in the subcutaneous tissue with additional layers of 3-0 Vicryl followed by 4-0 Vicryl and skin.  Dermabond was applied.  The patient tolerated the procedure well was taken to the recovery room in stable condition.     Disposition:  To PACU stable   V. [name], M.D. [facility] Office: [phone] Pager:  [phone]

## 2017-01-04 ENCOUNTER — Encounter (HOSPITAL_COMMUNITY): Payer: Self-pay | Admitting: Surgery

## 2017-01-04 DIAGNOSIS — Z992 Dependence on renal dialysis: Secondary | ICD-10-CM | POA: Diagnosis not present

## 2017-01-04 DIAGNOSIS — D631 Anemia in chronic kidney disease: Secondary | ICD-10-CM | POA: Diagnosis not present

## 2017-01-04 DIAGNOSIS — I2510 Atherosclerotic heart disease of native coronary artery without angina pectoris: Secondary | ICD-10-CM | POA: Diagnosis not present

## 2017-01-04 DIAGNOSIS — I120 Hypertensive chronic kidney disease with stage 5 chronic kidney disease or end stage renal disease: Secondary | ICD-10-CM | POA: Diagnosis not present

## 2017-01-04 DIAGNOSIS — N186 End stage renal disease: Secondary | ICD-10-CM | POA: Diagnosis not present

## 2017-01-04 DIAGNOSIS — N2581 Secondary hyperparathyroidism of renal origin: Secondary | ICD-10-CM | POA: Diagnosis not present

## 2017-01-04 DIAGNOSIS — E8889 Other specified metabolic disorders: Secondary | ICD-10-CM | POA: Diagnosis not present

## 2017-01-04 LAB — POCT I-STAT 4, (NA,K, GLUC, HGB,HCT)
Glucose, Bld: 101 mg/dL — ABNORMAL HIGH (ref 65–99)
HCT: 33 % — ABNORMAL LOW (ref 36.0–46.0)
HEMOGLOBIN: 11.2 g/dL — AB (ref 12.0–15.0)
SODIUM: 130 mmol/L — AB (ref 135–145)

## 2017-01-04 LAB — CBC
HCT: 29.7 % — ABNORMAL LOW (ref 36.0–46.0)
HEMOGLOBIN: 9.6 g/dL — AB (ref 12.0–15.0)
MCH: 27.7 pg (ref 26.0–34.0)
MCHC: 32.3 g/dL (ref 30.0–36.0)
MCV: 85.8 fL (ref 78.0–100.0)
Platelets: 208 10*3/uL (ref 150–400)
RBC: 3.46 MIL/uL — AB (ref 3.87–5.11)
RDW: 17.7 % — ABNORMAL HIGH (ref 11.5–15.5)
WBC: 7.7 10*3/uL (ref 4.0–10.5)

## 2017-01-04 LAB — RENAL FUNCTION PANEL
ANION GAP: 10 (ref 5–15)
Albumin: 3.2 g/dL — ABNORMAL LOW (ref 3.5–5.0)
BUN: 11 mg/dL (ref 6–20)
CALCIUM: 9.6 mg/dL (ref 8.9–10.3)
CO2: 27 mmol/L (ref 22–32)
Chloride: 99 mmol/L — ABNORMAL LOW (ref 101–111)
Creatinine, Ser: 5.36 mg/dL — ABNORMAL HIGH (ref 0.44–1.00)
GFR, EST AFRICAN AMERICAN: 9 mL/min — AB (ref >60)
GFR, EST NON AFRICAN AMERICAN: 8 mL/min — AB (ref >60)
Glucose, Bld: 118 mg/dL — ABNORMAL HIGH (ref 65–99)
PHOSPHORUS: 3.8 mg/dL (ref 2.5–4.6)
Potassium: 4.2 mmol/L (ref 3.5–5.1)
SODIUM: 136 mmol/L (ref 135–145)

## 2017-01-04 MED ORDER — HEPARIN SODIUM (PORCINE) 1000 UNIT/ML DIALYSIS: 1000.0000 [IU] | INTRAMUSCULAR | Status: DC | PRN

## 2017-01-04 MED ORDER — OXYCODONE HCL 5 MG PO TABS: 2.5000 mg | ORAL_TABLET | ORAL | 0 refills | Status: DC | PRN

## 2017-01-04 MED ORDER — LIDOCAINE-PRILOCAINE 2.5-2.5 % EX CREA: 1.0000 "application " | TOPICAL_CREAM | CUTANEOUS | Status: DC | PRN

## 2017-01-04 MED ORDER — FAMOTIDINE 20 MG PO TABS
20.0000 mg | ORAL_TABLET | Freq: Every day | ORAL | Status: DC
  Administered 2017-01-04: 20 mg via ORAL
  Filled 2017-01-04 (×2): qty 1

## 2017-01-04 MED ORDER — LIDOCAINE HCL (PF) 1 % IJ SOLN: 5.0000 mL | INTRAMUSCULAR | Status: DC | PRN

## 2017-01-04 MED ORDER — HEPARIN SODIUM (PORCINE) 1000 UNIT/ML DIALYSIS: 20.0000 [IU]/kg | INTRAMUSCULAR | Status: DC | PRN

## 2017-01-04 MED ORDER — SODIUM CHLORIDE 0.9 % IV SOLN: 100.0000 mL | INTRAVENOUS | Status: DC | PRN

## 2017-01-04 MED ORDER — CALCITRIOL 0.5 MCG PO CAPS
ORAL_CAPSULE | ORAL | Status: AC
  Filled 2017-01-04: qty 2

## 2017-01-04 MED ORDER — PENTAFLUOROPROP-TETRAFLUOROETH EX AERO: 1.0000 "application " | INHALATION_SPRAY | CUTANEOUS | Status: DC | PRN

## 2017-01-04 MED ORDER — ALTEPLASE 2 MG IJ SOLR: 2.0000 mg | Freq: Once | INTRAMUSCULAR | Status: DC | PRN

## 2017-01-04 MED ORDER — MIDODRINE HCL 5 MG PO TABS
ORAL_TABLET | ORAL | Status: AC
  Filled 2017-01-04: qty 2

## 2017-01-04 NOTE — Procedures (Signed)
Patient seen on Hemodialysis. QB 350, UF goal 2L (net) Treatment adjusted as needed.  Elmarie Shiley MD [facility]. Office # [phone] Pager # [phone] 8:47 AM

## 2017-01-04 NOTE — Discharge Instructions (Signed)
[facility]  Discharge Instructions  AV Fistula or Graft Surgery for Dialysis Access  Please refer to the following instructions for your post-procedure care. Your surgeon or physician assistant will discuss any changes with you.  Activity  You may drive the day following your surgery, if you are comfortable and no longer taking prescription pain medication. Resume full activity as the soreness in your incision resolves.  Bathing/Showering  You may shower after you go home. Keep your incision dry for 48 hours. Do not soak in a bathtub, hot tub, or swim until the incision heals completely. You may not shower if you have a hemodialysis catheter.  Incision Care  Clean your incision with mild soap and water after 48 hours. Pat the area dry with a clean towel. You do not need a bandage unless otherwise instructed. Do not apply any ointments or creams to your incision. You may have skin glue on your incision. Do not peel it off. It will come off on its own in about one week. Your arm may swell a bit after surgery. To reduce swelling use pillows to elevate your arm so it is above your heart. Your doctor will tell you if you need to lightly wrap your arm with an ACE bandage.  Diet  Resume your normal diet. There are not special food restrictions following this procedure. In order to heal from your surgery, it is CRITICAL to get adequate nutrition. Your body requires vitamins, minerals, and protein. Vegetables are the best source of vitamins and minerals. Vegetables also provide the perfect balance of protein. Processed food has little nutritional value, so try to avoid this.  Medications  Resume taking all of your medications. If your incision is causing pain, you may take over-the counter pain relievers such as acetaminophen (Tylenol). If you were prescribed a stronger pain medication, please be aware these medications can cause nausea and constipation. Prevent  nausea by taking the medication with a snack or meal. Avoid constipation by drinking plenty of fluids and eating foods with high amount of fiber, such as fruits, vegetables, and grains. Do not take Tylenol if you are taking prescription pain medications.  Follow up Your surgeon may want to see you in the office following your access surgery. If so, this will be arranged at the time of your surgery.  Please call us immediately for any of the following conditions:  Increased pain, redness, drainage (pus) from your incision site Fever of 101 degrees or higher Severe or worsening pain at your incision site Hand pain or numbness.  Reduce your risk of vascular disease:  Stop smoking. If you would like help, call [facility] at 1-800-QUIT-NOW (518)360-1145) or [place] at [place] your cholesterol Maintain a desired weight Control your diabetes Keep your blood pressure down  Dialysis  It will take several weeks to several months for your new dialysis access to be ready for use. Your surgeon will determine when it is OK to use it. Your nephrologist will continue to direct your dialysis. You can continue to use your Permcath until your new access is ready for use.   01/04/2017 [name] Marks [id number] May 23, 1955  Surgeon(s): Serafina Mitchell, MD  Procedure(s): INSERTION OF ARTERIOVENOUS (AV) GORE-TEX GRAFT LEFT THIGH   May stick graft immediately   May stick graft on designated area only:    Do not stick graft for 4 weeks    If you have any questions, please call the office at  [phone]. ° ° °

## 2017-01-04 NOTE — Progress Notes (Signed)
Patient discharged to home, IV removed, tele-box returned, AVS reviewed prescriptions provided, taken to exit in wheelchair

## 2017-01-04 NOTE — Progress Notes (Signed)
Patient ID: [name] Marks, female   DOB: November 24, 1955, 61 y.o.   MRN: [id number]  [facility] Progress Note   Assessment/ Plan:   1. S/p Left thigh loop AVG: POD #1 with excellent bruit and palpable thrill, anticipated to be useable in 3-4 weeks. 2. ESRD: continued on MWF HD schedule with dialysis today- appears euvolemic and without any acute electrolyte abnormality. 3. 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 Nutrition:continue renal diet and MVIs 6. Hypertension:Blood pressures well controlled with acceptable volume status- monitor at this time.  Subjective:   Reports to be feeling well with some surgical site discomfort as expected. Anticipated DC later today.    Objective:   BP (!) 108/59   Pulse 67   Temp 98 F (36.7 C) (Oral)   Resp 16   Ht 5' (1.524 m)   Wt 76 kg (167 lb 8.8 oz)   SpO2 100%   BMI 32.72 kg/m   Physical Exam: IRJ:[id number] resting on dialysis ZSW:[id number] regular rhythm and normal rate, normal s1 and s2 Resp:clear to auscultation anteriorly, no rales/rhonchi. Left IJ TDC TFT:DDUK, obese, non tender Ext:no LE edema, Left thigh AVG with good thrill and bruit  Labs: BMET Recent Labs  Lab 01/03/17 0748 01/03/17 0805 01/03/17 0819 01/03/17 1306  NA 130*  --  139 137  K >8.5* 4.2 4.0 3.8  CL  --   --   --  101  CO2  --   --   --  24  GLUCOSE 101*  --  96 138*  BUN  --   --   --  6  CREATININE  --   --   --  4.16*  CALCIUM  --   --   --  9.2   CBC Recent Labs  Lab 01/03/17 0748 01/03/17 0819 01/03/17 1306 01/04/17 0701  WBC  --   --  6.7 7.7  HGB 11.2* 11.2* 11.4* 9.6*  HCT 33.0* 33.0* 35.8* 29.7*  MCV  --   --  86.9 85.8  PLT  --   --  221 208   Medications:    . calcitRIOL  1 mcg Oral Q M,W,F-HD  . cinacalcet  180 mg Oral Q M,W,F-HD  . clopidogrel  75 mg Oral Daily  . enoxaparin (LOVENOX) injection  30 mg  Subcutaneous Q24H  . famotidine  20 mg Oral Daily  . feeding supplement (NEPRO CARB STEADY)  237 mL Oral BID BM  . multivitamin  1 tablet Oral QHS  . pantoprazole  40 mg Oral Daily  . sevelamer carbonate  800 mg Oral TID WC  . sodium chloride flush  3 mL Intravenous Q12H   Elmarie Shiley, MD 01/04/2017, 8:38 AM

## 2017-01-07 DIAGNOSIS — D509 Iron deficiency anemia, unspecified: Secondary | ICD-10-CM | POA: Diagnosis not present

## 2017-01-07 DIAGNOSIS — N186 End stage renal disease: Secondary | ICD-10-CM | POA: Diagnosis not present

## 2017-01-07 DIAGNOSIS — N2581 Secondary hyperparathyroidism of renal origin: Secondary | ICD-10-CM | POA: Diagnosis not present

## 2017-01-07 DIAGNOSIS — D631 Anemia in chronic kidney disease: Secondary | ICD-10-CM | POA: Diagnosis not present

## 2017-01-07 NOTE — Discharge Summary (Signed)
[facility] Discharge Summary   Patient ID:  [name] Marks MRN: [id number] DOB/AGE: 1955-11-10 61 y.o.  Admit date: 01/03/2017 Discharge date: 01/04/2017 Date of Surgery: 01/03/2017 Surgeon: Surgeon(s): Serafina Mitchell, MD  Admission Diagnosis: ESRD (end stage renal disease) ([facility]) [N18.6]  Discharge Diagnoses:  ESRD (end stage renal disease) ([place]) [N18.6]  Secondary Diagnoses: Past Medical History:  Diagnosis Date  . Anemia   . Arthritis    "knees" (01/03/2017)  . Carpal tunnel syndrome   . Complication of anesthesia    01/01/17- '"a long time ago, difficulty breathimg, not sure if it was due to anesthesia or not."  . End stage renal disease on dialysis ([place])    "MWF; [address]." (01/03/2017)  . GERD (gastroesophageal reflux disease)   . Gout   . History of blood transfusion 2017   "related to blood poison" (01/03/2017)  . Osteoporosis 09/2016   T score -2.6  . Septic shock ([place])   . Staphylococcus aureus bacteremia   . TIA (transient ischemic attack)    "several" (01/03/2017)    Procedure(s): INSERTION OF ARTERIOVENOUS (AV) GORE-TEX GRAFT LEFT THIGH  Discharged Condition: good  HPI: Patient is a 61 year old female here for evaluation for possible thigh graft for hemodialysis access. She was last seen by my partner Dr. Donzetta Matters June 2018 who recommended a thigh graft at that point. She has previously had a right thigh graft. Her current dialysis schedule is Monday Wednesday Friday. She has a left side dialysis catheter in her internal jugular vein. She has had multiple catheter infections and previous right upper arm graft infection.  She had essentially normal ABIs may of 2018.  She previously thought that she was too weak to undergo the procedure and has put off until now. She now wishes to proceed.  She is on Plavix.  Review of systems: She denies shortness of breath. She denies chest pain. Her dialysis center is on [address] Monday Wednesday  Friday.     Hospital Course:  [name] Marks is a 61 y.o. female is S/P Left Procedure(s): INSERTION OF ARTERIOVENOUS (AV) GORE-TEX GRAFT LEFT THIGH  Consults:  Treatment Team:  Elmarie Shiley, MD   She received HD on the morning of POD#1.  After HD she was discharged home in stable condition. Groin soft with out hematoma.  She will follow up with our NP in 2 weeks for an incisional check.  Do not stick graft for 4 weeks.  Significant Diagnostic Studies: CBC Lab Results  Component Value Date   WBC 7.7 01/04/2017   HGB 9.6 (L) 01/04/2017   HCT 29.7 (L) 01/04/2017   MCV 85.8 01/04/2017   PLT 208 01/04/2017    BMET    Component Value Date/Time   NA 136 01/04/2017 0701   NA 139 12/17/2012   NA 138 07/04/2011 1058   K 4.2 01/04/2017 0701   K 4.9 05/19/2012 0937   CL 99 (L) 01/04/2017 0701   CL 103 07/04/2011 1058   CO2 27 01/04/2017 0701   CO2 29 07/04/2011 1058   GLUCOSE 118 (H) 01/04/2017 0701   GLUCOSE 91 07/04/2011 1058   BUN 11 01/04/2017 0701   BUN 34 (A) 12/17/2012   BUN 18 07/04/2011 1058   CREATININE 5.36 (H) 01/04/2017 0701   CREATININE 5.85 (H) 10/28/2014 0918   CALCIUM 9.6 01/04/2017 0701   CALCIUM 10.2 (H) 07/04/2011 1058   GFRNONAA 8 (L) 01/04/2017 0701   GFRNONAA 7 (L) 07/04/2011 1058   GFRAA 9 (  L) 01/04/2017 0701   GFRAA 8 (L) 07/04/2011 1058   COAG Lab Results  Component Value Date   INR 1.08 01/03/2017   INR 0.96 09/28/2016   INR 1.06 05/16/2016     Disposition:  Discharge to :Home Discharge Instructions    Discharge patient   Complete by:  As directed    Discharge disposition:  01-Home or Self Care   Discharge patient date:  01/04/2017     Allergies as of 01/04/2017      Reactions   Contrast Media [iodinated Diagnostic Agents] Anaphylaxis   Ancef [cefazolin] Other (See Comments)   Pt states she can not tolerate   Naproxen Sodium Itching      Medication List    TAKE these medications   cinacalcet 60 MG tablet Commonly known  as:  SENSIPAR Take 120 mg by mouth every Monday, Wednesday, and Friday with hemodialysis.   clopidogrel 75 MG tablet Commonly known as:  PLAVIX Take 1 tablet (75 mg total) by mouth daily.   famotidine 20 MG tablet Commonly known as:  PEPCID Take 20 mg by mouth 2 (two) times daily. Lunch and dinner   fluticasone 50 MCG/ACT nasal spray Commonly known as:  FLONASE Place 2 sprays into both nostrils daily. What changed:    when to take this  reasons to take this   ipratropium 0.06 % nasal spray Commonly known as:  ATROVENT Place 2 sprays into both nostrils 4 (four) times daily. What changed:    when to take this  reasons to take this   midodrine 10 MG tablet Commonly known as:  PROAMATINE Take 1 tablet (10 mg total) by mouth 2 (two) times daily as needed (30 min prior to HD).   oxyCODONE 5 MG immediate release tablet Commonly known as:  ROXICODONE Take 0.5 tablets (2.5 mg total) by mouth every 6 (six) hours as needed for severe pain. What changed:  Another medication with the same name was added. Make sure you understand how and when to take each.   oxyCODONE 5 MG immediate release tablet Commonly known as:  ROXICODONE Take 0.5-1 tablets (2.5-5 mg total) every 4 (four) hours as needed by mouth for severe pain. What changed:  You were already taking a medication with the same name, and this prescription was added. Make sure you understand how and when to take each.   PRORENAL QD PO Take 1 tablet by mouth daily.   RENVELA 800 MG tablet Generic drug:  sevelamer carbonate Take 800 mg by mouth 3 (three) times daily with meals.      Verbal and written Discharge instructions given to the patient. Wound care per Discharge AVS Follow-up Information    Serafina Mitchell, MD In 2 weeks.   Specialties:  Vascular Surgery, Cardiology Why:  Office will call you to arrange your appt (sent) Contact information: [place] [address] [phone]            Signed: Laurence Slate [facility] 01/07/2017, 8:56 AM

## 2017-01-09 ENCOUNTER — Telehealth: Payer: Self-pay | Admitting: Surgery

## 2017-01-09 DIAGNOSIS — D631 Anemia in chronic kidney disease: Secondary | ICD-10-CM | POA: Diagnosis not present

## 2017-01-09 DIAGNOSIS — N186 End stage renal disease: Secondary | ICD-10-CM | POA: Diagnosis not present

## 2017-01-09 DIAGNOSIS — N2581 Secondary hyperparathyroidism of renal origin: Secondary | ICD-10-CM | POA: Diagnosis not present

## 2017-01-09 DIAGNOSIS — D509 Iron deficiency anemia, unspecified: Secondary | ICD-10-CM | POA: Diagnosis not present

## 2017-01-09 NOTE — Telephone Encounter (Signed)
Sched appt 01/21/17 at 12:15. Spoke to pt.

## 2017-01-09 NOTE — Telephone Encounter (Signed)
-----   Message from Mena Goes, RN sent at 01/08/2017 12:46 PM EST ----- Regarding: FW: What timeframe?   ----- Message ----- From: Ulyses Amor, PA-C Sent: 01/08/2017  12:39 PM To: Mena Goes, RN Subject: RE: What timeframe?                            2 weeks pls  ----- Message ----- From: Mena Goes, RN Sent: 01/03/2017  11:21 AM To: Ulyses Amor, PA-C Subject: What timeframe?                                What time frame? ----- Message ----- From: Ulyses Amor, PA-C Sent: 01/03/2017  11:13 AM To: Vvs Charge Pool  F/U with Vinnie Level for wound/incision check per Dr. Trula Slade.  S/P left thigh graft placement.

## 2017-01-11 DIAGNOSIS — N2581 Secondary hyperparathyroidism of renal origin: Secondary | ICD-10-CM | POA: Diagnosis not present

## 2017-01-11 DIAGNOSIS — D631 Anemia in chronic kidney disease: Secondary | ICD-10-CM | POA: Diagnosis not present

## 2017-01-11 DIAGNOSIS — N186 End stage renal disease: Secondary | ICD-10-CM | POA: Diagnosis not present

## 2017-01-11 DIAGNOSIS — D509 Iron deficiency anemia, unspecified: Secondary | ICD-10-CM | POA: Diagnosis not present

## 2017-01-13 DIAGNOSIS — N186 End stage renal disease: Secondary | ICD-10-CM | POA: Diagnosis not present

## 2017-01-13 DIAGNOSIS — D509 Iron deficiency anemia, unspecified: Secondary | ICD-10-CM | POA: Diagnosis not present

## 2017-01-13 DIAGNOSIS — N2581 Secondary hyperparathyroidism of renal origin: Secondary | ICD-10-CM | POA: Diagnosis not present

## 2017-01-13 DIAGNOSIS — D631 Anemia in chronic kidney disease: Secondary | ICD-10-CM | POA: Diagnosis not present

## 2017-01-15 DIAGNOSIS — N186 End stage renal disease: Secondary | ICD-10-CM | POA: Diagnosis not present

## 2017-01-15 DIAGNOSIS — N2581 Secondary hyperparathyroidism of renal origin: Secondary | ICD-10-CM | POA: Diagnosis not present

## 2017-01-15 DIAGNOSIS — D631 Anemia in chronic kidney disease: Secondary | ICD-10-CM | POA: Diagnosis not present

## 2017-01-15 DIAGNOSIS — D509 Iron deficiency anemia, unspecified: Secondary | ICD-10-CM | POA: Diagnosis not present

## 2017-01-18 DIAGNOSIS — D509 Iron deficiency anemia, unspecified: Secondary | ICD-10-CM | POA: Diagnosis not present

## 2017-01-18 DIAGNOSIS — N2581 Secondary hyperparathyroidism of renal origin: Secondary | ICD-10-CM | POA: Diagnosis not present

## 2017-01-18 DIAGNOSIS — N186 End stage renal disease: Secondary | ICD-10-CM | POA: Diagnosis not present

## 2017-01-18 DIAGNOSIS — D631 Anemia in chronic kidney disease: Secondary | ICD-10-CM | POA: Diagnosis not present

## 2017-01-21 ENCOUNTER — Encounter: Payer: Self-pay | Admitting: Family

## 2017-01-21 ENCOUNTER — Ambulatory Visit (INDEPENDENT_AMBULATORY_CARE_PROVIDER_SITE_OTHER): Payer: Medicare Other | Admitting: Family

## 2017-01-21 VITALS — BP 93/51 | HR 76 | Temp 97.8°F | Resp 18 | Wt 161.3 lb

## 2017-01-21 DIAGNOSIS — N2581 Secondary hyperparathyroidism of renal origin: Secondary | ICD-10-CM | POA: Diagnosis not present

## 2017-01-21 DIAGNOSIS — D509 Iron deficiency anemia, unspecified: Secondary | ICD-10-CM | POA: Diagnosis not present

## 2017-01-21 DIAGNOSIS — N186 End stage renal disease: Secondary | ICD-10-CM | POA: Diagnosis not present

## 2017-01-21 DIAGNOSIS — Z9889 Other specified postprocedural states: Secondary | ICD-10-CM

## 2017-01-21 DIAGNOSIS — Z992 Dependence on renal dialysis: Secondary | ICD-10-CM

## 2017-01-21 DIAGNOSIS — D631 Anemia in chronic kidney disease: Secondary | ICD-10-CM | POA: Diagnosis not present

## 2017-01-21 DIAGNOSIS — Z95828 Presence of other vascular implants and grafts: Secondary | ICD-10-CM

## 2017-01-21 NOTE — Progress Notes (Signed)
Postoperative Access Visit   History of Present Illness  [name] Marks is a 61 y.o. year old female who presents for postoperative follow-up for: Left thigh dialysis graft with 6 mm PTFE (Date: 01-03-17) by Dr. Trula Slade.   The patient's left anterior thigh incision is healed.  The patient is able to complete their activities of daily living.  The patient's current symptoms are: feels tired after dialysis this morning.  She dialyzes M-W-F via left upper chest catheter.  She denies fever or chills.  She states she still has some discomfort in her left thigh, but this is improving.   She has had HD accesses in both arms.   For VQI Use Only  PRE-ADM LIVING: Home  AMB STATUS: Ambulatory   Past Medical History:  Diagnosis Date  . Anemia   . Arthritis    "knees" (01/03/2017)  . Carpal tunnel syndrome   . Complication of anesthesia    01/01/17- '"a long time ago, difficulty breathimg, not sure if it was due to anesthesia or not."  . End stage renal disease on dialysis ([place])    "MWF; [address]." (01/03/2017)  . GERD (gastroesophageal reflux disease)   . Gout   . History of blood transfusion 2017   "related to blood poison" (01/03/2017)  . Osteoporosis 09/2016   T score -2.6  . Septic shock ([place])   . Staphylococcus aureus bacteremia   . TIA (transient ischemic attack)    "several" (01/03/2017)    Past Surgical History:  Procedure Laterality Date  . AORTIC VALVE REPLACEMENT N/A 02/25/2016   Procedure: AORTIC VALVE REPLACEMENT (AVR) implanted with Magna Ease Aortic valve size 53mm;  Surgeon: Melrose Nakayama, MD;  Location: [facility];  Service: Open Heart Surgery;  Laterality: N/A;  . AV FISTULA PLACEMENT Left 01/03/2017   Procedure: INSERTION OF ARTERIOVENOUS (AV) GORE-TEX GRAFT LEFT THIGH;  Surgeon: Serafina Mitchell, MD;  Location: [facility];  Service: Vascular;  Laterality: Left;  . [place] REMOVAL Right 02/17/2016   Procedure: REMOVAL OF TWO ARTERIOVENOUS GORETEX GRAFTS ([place]);   Surgeon: Angelia Mould, MD;  Location: [facility];  Service: Vascular;  Laterality: Right;  . BREAST BIOPSY Left 2013   stereo   . BREAST BIOPSY Right 2011   stereo   . CARDIAC VALVE REPLACEMENT    . COLONOSCOPY W/ POLYPECTOMY    . DG AV DIALYSIS GRAFT DECLOT OR    . DILATATION & CURETTAGE/HYSTEROSCOPY WITH TRUECLEAR N/A 11/06/2012   Procedure: DILATATION & CURETTAGE/HYSTEROSCOPY WITH TRUECLEAR;  Surgeon: Terrance Mass, MD;  Location: [facility];  Service: Gynecology;  Laterality: N/A;  Truclear Resectoscopic Polypectomy   . INSERTION OF DIALYSIS CATHETER N/A 02/19/2016   Procedure: INSERTION OF Left Internal Jugular DIALYSIS CATHETER;  Surgeon: Angelia Mould, MD;  Location: [facility];  Service: Vascular;  Laterality: N/A;  . LOOP RECORDER INSERTION N/A 10/01/2016   Procedure: LOOP RECORDER INSERTION;  Surgeon: Constance Haw, MD;  Location: [facility];  Service: Cardiovascular;  Laterality: N/A;  . PATCH ANGIOPLASTY Right 02/17/2016   Procedure: PATCH ANGIOPLASTY;  Surgeon: Angelia Mould, MD;  Location: [facility];  Service: Vascular;  Laterality: Right;  . PERIPHERAL VASCULAR CATHETERIZATION N/A 09/14/2014   Procedure: A/V Shuntogram/Fistulagram;  Surgeon: Katha Cabal, MD;  Location: [facility];  Service: Cardiovascular;  Laterality: N/A;  . PERIPHERAL VASCULAR CATHETERIZATION N/A 09/14/2014   Procedure: A/V Shunt Intervention;  Surgeon: Katha Cabal, MD;  Location: [facility];  Service: Cardiovascular;  Laterality: N/A;  . PERIPHERAL VASCULAR CATHETERIZATION Right 12/09/2014   Procedure: A/V Shuntogram/Fistulagram;  Surgeon: Algernon Huxley, MD;  Location: [facility];  Service: Cardiovascular;  Laterality: Right;  . PERIPHERAL VASCULAR CATHETERIZATION N/A 12/09/2014   Procedure: A/V Shunt Intervention;  Surgeon: Algernon Huxley, MD;  Location: [facility];  Service: Cardiovascular;  Laterality: N/A;  . PERIPHERAL VASCULAR  CATHETERIZATION Right 05/24/2015   Procedure: A/V Shuntogram;  Surgeon: Serafina Mitchell, MD;  Location: [facility];  Service: Cardiovascular;  Laterality: Right;  . PERIPHERAL VASCULAR CATHETERIZATION Right 05/24/2015   Procedure: Peripheral Vascular Balloon Angioplasty;  Surgeon: Serafina Mitchell, MD;  Location: [facility];  Service: Cardiovascular;  Laterality: Right;  right arm shunt  . PERIPHERAL VASCULAR CATHETERIZATION N/A 06/13/2015   Procedure: A/V Shuntogram/Fistulagram;  Surgeon: Algernon Huxley, MD;  Location: [facility];  Service: Cardiovascular;  Laterality: N/A;  . PERIPHERAL VASCULAR CATHETERIZATION N/A 06/13/2015   Procedure: A/V Shunt Intervention;  Surgeon: Algernon Huxley, MD;  Location: [facility];  Service: Cardiovascular;  Laterality: N/A;  . TEE WITHOUT CARDIOVERSION N/A 02/22/2016   Procedure: TRANSESOPHAGEAL ECHOCARDIOGRAM (TEE);  Surgeon: Pixie Casino, MD;  Location: [facility];  Service: Cardiovascular;  Laterality: N/A;  . TEE WITHOUT CARDIOVERSION N/A 02/25/2016   Procedure: TRANSESOPHAGEAL ECHOCARDIOGRAM (TEE);  Surgeon: Melrose Nakayama, MD;  Location: [facility];  Service: Open Heart Surgery;  Laterality: N/A;  . TEE WITHOUT CARDIOVERSION N/A 10/01/2016   Procedure: TRANSESOPHAGEAL ECHOCARDIOGRAM (TEE);  Surgeon: Pixie Casino, MD;  Location: [facility];  Service: Cardiovascular;  Laterality: N/A;  . [place] History   Socioeconomic History  . Marital status: Divorced    Spouse name: Not on file  . Number of children: Not on file  . Years of education: Not on file  . Highest education level: Not on file  Social Needs  . Financial resource strain: Not on file  . Food insecurity - worry: Not on file  . Food insecurity - inability: Not on file  . Transportation needs - medical: Not on file  . Transportation needs - non-medical: Not on file  Occupational History  . Not on file  Tobacco Use  . Smoking  status: Never Smoker  . Smokeless tobacco: Never Used  Substance and Sexual Activity  . Alcohol use: No  . Drug use: No  . Sexual activity: No    Birth control/protection: Post-menopausal    Comment: 1st intercourse 60 yo-Fewer than 5 partners  Other Topics Concern  . Not on file  Social History Narrative  . Not on file    Allergies  Allergen Reactions  . Contrast Media [Iodinated Diagnostic Agents] Anaphylaxis  . Ancef [Cefazolin] Other (See Comments)    Pt states she can not tolerate  . Naproxen Sodium Itching    Current Outpatient Medications on File Prior to Visit  Medication Sig Dispense Refill  . cinacalcet (SENSIPAR) 60 MG tablet Take 120 mg by mouth every Monday, Wednesday, and Friday with hemodialysis.     Marland Kitchen clopidogrel (PLAVIX) 75 MG tablet Take 1 tablet (75 mg total) by mouth daily. 30 tablet 0  . famotidine (PEPCID) 20 MG tablet Take 20 mg by mouth 2 (two) times daily. Lunch and dinner    . fluticasone (FLONASE) 50 MCG/ACT nasal spray Place 2 sprays into both nostrils daily. (Patient taking differently: Place 2 sprays into both nostrils daily  as needed for allergies. ) 9.9 g 2  . ipratropium (ATROVENT) 0.06 % nasal spray Place 2 sprays into both nostrils 4 (four) times daily. (Patient taking differently: Place 2 sprays into both nostrils 3 (three) times daily as needed for rhinitis. ) 15 mL 12  . midodrine (PROAMATINE) 10 MG tablet Take 1 tablet (10 mg total) by mouth 2 (two) times daily as needed (30 min prior to HD). 60 tablet 0  . Multiple Vitamins-Minerals (PRORENAL QD PO) Take 1 tablet by mouth daily.     Marland Kitchen oxyCODONE (ROXICODONE) 5 MG immediate release tablet Take 0.5 tablets (2.5 mg total) by mouth every 6 (six) hours as needed for severe pain. 10 tablet 0  . oxyCODONE (ROXICODONE) 5 MG immediate release tablet Take 0.5-1 tablets (2.5-5 mg total) every 4 (four) hours as needed by mouth for severe pain. 6 tablet 0  . sevelamer (RENVELA) 800 MG tablet Take 800 mg by  mouth 3 (three) times daily with meals.      No current facility-administered medications on file prior to visit.       Physical Examination Vitals:   01/21/17 1210  BP: (!) 93/51  Pulse: 76  Resp: 18  Temp: 97.8 F (36.6 C)  TempSrc: Oral  SpO2: 100%  Weight: 161 lb 4.8 oz (73.2 kg)   Body mass index is 31.5 kg/m.  Left anterior thigh incision is healed., skin feels warm and normal, bruit can be auscultated.  2+ palpable bilateral DP pulses.   Medical Decision Making  [name] Marks is a 61 y.o. year old female who presents s/p Left thigh dialysis graft with 6 mm PTFE (Date: 01-03-17).   The patient's access will be ready for use the second week in December 2018  The patient's tunneled dialysis catheter can be removed after two successful cannulations and completed dialysis treatments.  Thank you for allowing [place] to participate in this patient's care.  NICKEL, Sharmon Leyden, RN, MSN, FNP-C [facility] Office: [phone]  01/21/2017, 12:34 PM  Clinic MD: Trula Slade

## 2017-01-23 ENCOUNTER — Other Ambulatory Visit: Payer: Self-pay | Admitting: Internal Medicine

## 2017-01-23 DIAGNOSIS — N186 End stage renal disease: Secondary | ICD-10-CM | POA: Diagnosis not present

## 2017-01-23 DIAGNOSIS — D509 Iron deficiency anemia, unspecified: Secondary | ICD-10-CM | POA: Diagnosis not present

## 2017-01-23 DIAGNOSIS — N2581 Secondary hyperparathyroidism of renal origin: Secondary | ICD-10-CM | POA: Diagnosis not present

## 2017-01-23 DIAGNOSIS — D631 Anemia in chronic kidney disease: Secondary | ICD-10-CM | POA: Diagnosis not present

## 2017-01-23 MED ORDER — CLOPIDOGREL BISULFATE 75 MG PO TABS: 75.0000 mg | ORAL_TABLET | Freq: Every day | ORAL | 3 refills | Status: DC

## 2017-01-25 DIAGNOSIS — D509 Iron deficiency anemia, unspecified: Secondary | ICD-10-CM | POA: Diagnosis not present

## 2017-01-25 DIAGNOSIS — N186 End stage renal disease: Secondary | ICD-10-CM | POA: Diagnosis not present

## 2017-01-25 DIAGNOSIS — N039 Chronic nephritic syndrome with unspecified morphologic changes: Secondary | ICD-10-CM | POA: Diagnosis not present

## 2017-01-25 DIAGNOSIS — Z992 Dependence on renal dialysis: Secondary | ICD-10-CM | POA: Diagnosis not present

## 2017-01-25 DIAGNOSIS — D631 Anemia in chronic kidney disease: Secondary | ICD-10-CM | POA: Diagnosis not present

## 2017-01-25 DIAGNOSIS — N2581 Secondary hyperparathyroidism of renal origin: Secondary | ICD-10-CM | POA: Diagnosis not present

## 2017-01-28 ENCOUNTER — Inpatient Hospital Stay (HOSPITAL_COMMUNITY)
Admission: EM | Admit: 2017-01-28 | Discharge: 2017-02-01 | DRG: 417 | Disposition: A | Discharge status: Home Health | Payer: Medicare Other | Attending: Family Medicine | Admitting: Family Medicine

## 2017-01-28 ENCOUNTER — Encounter (HOSPITAL_COMMUNITY): Payer: Self-pay | Admitting: *Deleted

## 2017-01-28 ENCOUNTER — Other Ambulatory Visit: Payer: Self-pay

## 2017-01-28 ENCOUNTER — Emergency Department (HOSPITAL_COMMUNITY): Payer: Medicare Other

## 2017-01-28 DIAGNOSIS — Z841 Family history of disorders of kidney and ureter: Secondary | ICD-10-CM | POA: Diagnosis not present

## 2017-01-28 DIAGNOSIS — Z953 Presence of xenogenic heart valve: Secondary | ICD-10-CM | POA: Diagnosis not present

## 2017-01-28 DIAGNOSIS — Z8679 Personal history of other diseases of the circulatory system: Secondary | ICD-10-CM | POA: Diagnosis not present

## 2017-01-28 DIAGNOSIS — Z91041 Radiographic dye allergy status: Secondary | ICD-10-CM

## 2017-01-28 DIAGNOSIS — E669 Obesity, unspecified: Secondary | ICD-10-CM | POA: Diagnosis present

## 2017-01-28 DIAGNOSIS — D649 Anemia, unspecified: Secondary | ICD-10-CM | POA: Diagnosis not present

## 2017-01-28 DIAGNOSIS — E8889 Other specified metabolic disorders: Secondary | ICD-10-CM | POA: Diagnosis present

## 2017-01-28 DIAGNOSIS — N186 End stage renal disease: Secondary | ICD-10-CM | POA: Diagnosis not present

## 2017-01-28 DIAGNOSIS — Z881 Allergy status to other antibiotic agents status: Secondary | ICD-10-CM

## 2017-01-28 DIAGNOSIS — Z7951 Long term (current) use of inhaled steroids: Secondary | ICD-10-CM

## 2017-01-28 DIAGNOSIS — K219 Gastro-esophageal reflux disease without esophagitis: Secondary | ICD-10-CM | POA: Diagnosis present

## 2017-01-28 DIAGNOSIS — K819 Cholecystitis, unspecified: Secondary | ICD-10-CM

## 2017-01-28 DIAGNOSIS — K8020 Calculus of gallbladder without cholecystitis without obstruction: Secondary | ICD-10-CM | POA: Diagnosis not present

## 2017-01-28 DIAGNOSIS — I120 Hypertensive chronic kidney disease with stage 5 chronic kidney disease or end stage renal disease: Secondary | ICD-10-CM | POA: Diagnosis not present

## 2017-01-28 DIAGNOSIS — Z95818 Presence of other cardiac implants and grafts: Secondary | ICD-10-CM | POA: Diagnosis not present

## 2017-01-28 DIAGNOSIS — K8010 Calculus of gallbladder with chronic cholecystitis without obstruction: Secondary | ICD-10-CM | POA: Diagnosis not present

## 2017-01-28 DIAGNOSIS — I639 Cerebral infarction, unspecified: Secondary | ICD-10-CM | POA: Diagnosis not present

## 2017-01-28 DIAGNOSIS — Z952 Presence of prosthetic heart valve: Secondary | ICD-10-CM

## 2017-01-28 DIAGNOSIS — Z992 Dependence on renal dialysis: Secondary | ICD-10-CM | POA: Diagnosis not present

## 2017-01-28 DIAGNOSIS — Z7902 Long term (current) use of antithrombotics/antiplatelets: Secondary | ICD-10-CM

## 2017-01-28 DIAGNOSIS — Z6832 Body mass index (BMI) 32.0-32.9, adult: Secondary | ICD-10-CM

## 2017-01-28 DIAGNOSIS — N2581 Secondary hyperparathyroidism of renal origin: Secondary | ICD-10-CM | POA: Diagnosis present

## 2017-01-28 DIAGNOSIS — Z886 Allergy status to analgesic agent status: Secondary | ICD-10-CM | POA: Diagnosis not present

## 2017-01-28 DIAGNOSIS — Z01810 Encounter for preprocedural cardiovascular examination: Secondary | ICD-10-CM | POA: Diagnosis not present

## 2017-01-28 DIAGNOSIS — R1011 Right upper quadrant pain: Secondary | ICD-10-CM

## 2017-01-28 DIAGNOSIS — I361 Nonrheumatic tricuspid (valve) insufficiency: Secondary | ICD-10-CM | POA: Diagnosis not present

## 2017-01-28 DIAGNOSIS — D631 Anemia in chronic kidney disease: Secondary | ICD-10-CM | POA: Diagnosis not present

## 2017-01-28 DIAGNOSIS — K8081 Other cholelithiasis with obstruction: Secondary | ICD-10-CM

## 2017-01-28 DIAGNOSIS — M109 Gout, unspecified: Secondary | ICD-10-CM | POA: Diagnosis present

## 2017-01-28 DIAGNOSIS — Z8673 Personal history of transient ischemic attack (TIA), and cerebral infarction without residual deficits: Secondary | ICD-10-CM

## 2017-01-28 DIAGNOSIS — I272 Pulmonary hypertension, unspecified: Secondary | ICD-10-CM | POA: Diagnosis not present

## 2017-01-28 HISTORY — DX: Calculus of gallbladder without cholecystitis without obstruction: K80.20

## 2017-01-28 LAB — COMPREHENSIVE METABOLIC PANEL
ALBUMIN: 3.8 g/dL (ref 3.5–5.0)
ALK PHOS: 277 U/L — AB (ref 38–126)
ALT: 15 U/L (ref 14–54)
ANION GAP: 13 (ref 5–15)
AST: 26 U/L (ref 15–41)
BUN: 25 mg/dL — ABNORMAL HIGH (ref 6–20)
CALCIUM: 8.8 mg/dL — AB (ref 8.9–10.3)
CHLORIDE: 103 mmol/L (ref 101–111)
CO2: 24 mmol/L (ref 22–32)
Creatinine, Ser: 9.2 mg/dL — ABNORMAL HIGH (ref 0.44–1.00)
GFR calc non Af Amer: 4 mL/min — ABNORMAL LOW (ref >60)
GFR, EST AFRICAN AMERICAN: 5 mL/min — AB (ref >60)
GLUCOSE: 102 mg/dL — AB (ref 65–99)
POTASSIUM: 4.2 mmol/L (ref 3.5–5.1)
SODIUM: 140 mmol/L (ref 135–145)
Total Bilirubin: 0.6 mg/dL (ref 0.3–1.2)
Total Protein: 7.2 g/dL (ref 6.5–8.1)

## 2017-01-28 LAB — CBC
HEMATOCRIT: 33.7 % — AB (ref 36.0–46.0)
HEMOGLOBIN: 10.8 g/dL — AB (ref 12.0–15.0)
MCH: 27.7 pg (ref 26.0–34.0)
MCHC: 32 g/dL (ref 30.0–36.0)
MCV: 86.4 fL (ref 78.0–100.0)
PLATELETS: 272 10*3/uL (ref 150–400)
RBC: 3.9 MIL/uL (ref 3.87–5.11)
RDW: 17.4 % — ABNORMAL HIGH (ref 11.5–15.5)
WBC: 7 10*3/uL (ref 4.0–10.5)

## 2017-01-28 LAB — LIPASE, BLOOD: LIPASE: 32 U/L (ref 11–51)

## 2017-01-28 MED ORDER — PENTAFLUOROPROP-TETRAFLUOROETH EX AERO: 1.0000 "application " | INHALATION_SPRAY | CUTANEOUS | Status: DC | PRN

## 2017-01-28 MED ORDER — FAMOTIDINE 20 MG PO TABS
20.0000 mg | ORAL_TABLET | Freq: Two times a day (BID) | ORAL | Status: DC
  Administered 2017-01-28: 20 mg via ORAL
  Filled 2017-01-28: qty 1

## 2017-01-28 MED ORDER — FENTANYL CITRATE (PF) 100 MCG/2ML IJ SOLN
50.0000 ug | INTRAMUSCULAR | Status: DC | PRN
  Administered 2017-01-30 (×2): 50 ug via INTRAVENOUS
  Filled 2017-01-28 (×2): qty 2

## 2017-01-28 MED ORDER — FENTANYL CITRATE (PF) 100 MCG/2ML IJ SOLN
50.0000 ug | INTRAMUSCULAR | Status: DC | PRN
Start: 2017-01-28 — End: 2017-01-28
  Administered 2017-01-28: 50 ug via INTRAVENOUS
  Filled 2017-01-28: qty 2

## 2017-01-28 MED ORDER — LIDOCAINE HCL (PF) 1 % IJ SOLN: 5.0000 mL | INTRAMUSCULAR | Status: DC | PRN

## 2017-01-28 MED ORDER — ALTEPLASE 2 MG IJ SOLR: 2.0000 mg | Freq: Once | INTRAMUSCULAR | Status: DC | PRN

## 2017-01-28 MED ORDER — SODIUM CHLORIDE 0.9 % IV SOLN: 250.0000 mL | INTRAVENOUS | Status: DC | PRN

## 2017-01-28 MED ORDER — SEVELAMER CARBONATE 800 MG PO TABS
800.0000 mg | ORAL_TABLET | Freq: Three times a day (TID) | ORAL | Status: DC
  Administered 2017-01-28 – 2017-02-01 (×6): 800 mg via ORAL
  Filled 2017-01-28 (×6): qty 1

## 2017-01-28 MED ORDER — HEPARIN SODIUM (PORCINE) 1000 UNIT/ML DIALYSIS: 1000.0000 [IU] | INTRAMUSCULAR | Status: DC | PRN

## 2017-01-28 MED ORDER — SODIUM CHLORIDE 0.9% FLUSH: 3.0000 mL | INTRAVENOUS | Status: DC | PRN

## 2017-01-28 MED ORDER — SODIUM CHLORIDE 0.9 % IV SOLN: 100.0000 mL | INTRAVENOUS | Status: DC | PRN

## 2017-01-28 MED ORDER — SODIUM CHLORIDE 0.9% FLUSH
3.0000 mL | Freq: Two times a day (BID) | INTRAVENOUS | Status: DC
  Administered 2017-01-28 – 2017-02-01 (×5): 3 mL via INTRAVENOUS

## 2017-01-28 MED ORDER — MIDODRINE HCL 5 MG PO TABS
10.0000 mg | ORAL_TABLET | Freq: Two times a day (BID) | ORAL | Status: DC | PRN
  Administered 2017-01-29 – 2017-01-31 (×2): 10 mg via ORAL
  Filled 2017-01-28: qty 2

## 2017-01-28 MED ORDER — IPRATROPIUM BROMIDE 0.06 % NA SOLN: 2.0000 | Freq: Four times a day (QID) | NASAL | Status: DC

## 2017-01-28 MED ORDER — ONDANSETRON HCL 4 MG PO TABS
4.0000 mg | ORAL_TABLET | Freq: Two times a day (BID) | ORAL | Status: DC | PRN
  Administered 2017-01-31: 4 mg via ORAL
  Filled 2017-01-28: qty 1

## 2017-01-28 MED ORDER — CINACALCET HCL 30 MG PO TABS: 120.0000 mg | ORAL_TABLET | ORAL | Status: DC

## 2017-01-28 MED ORDER — POLYETHYLENE GLYCOL 3350 17 G PO PACK
17.0000 g | PACK | Freq: Every day | ORAL | Status: DC | PRN
  Administered 2017-02-01: 17 g via ORAL
  Filled 2017-01-28 (×2): qty 1

## 2017-01-28 MED ORDER — HEPARIN SODIUM (PORCINE) 5000 UNIT/ML IJ SOLN
5000.0000 [IU] | Freq: Three times a day (TID) | INTRAMUSCULAR | Status: DC
  Administered 2017-01-28 – 2017-01-29 (×5): 5000 [IU] via SUBCUTANEOUS
  Filled 2017-01-28 (×5): qty 1

## 2017-01-28 MED ORDER — OXYCODONE-ACETAMINOPHEN 5-325 MG PO TABS
1.0000 | ORAL_TABLET | ORAL | Status: DC | PRN
  Administered 2017-01-28: 1 via ORAL
  Filled 2017-01-28: qty 1

## 2017-01-28 MED ORDER — LIDOCAINE-PRILOCAINE 2.5-2.5 % EX CREA: 1.0000 "application " | TOPICAL_CREAM | CUTANEOUS | Status: DC | PRN

## 2017-01-28 MED ORDER — ACETAMINOPHEN 650 MG RE SUPP
650.0000 mg | Freq: Four times a day (QID) | RECTAL | Status: DC | PRN
Start: 2017-01-28 — End: 2017-02-01

## 2017-01-28 MED ORDER — FAMOTIDINE 20 MG PO TABS
20.0000 mg | ORAL_TABLET | Freq: Every day | ORAL | Status: DC
  Administered 2017-01-29 – 2017-02-01 (×2): 20 mg via ORAL
  Filled 2017-01-28 (×2): qty 1

## 2017-01-28 MED ORDER — ACETAMINOPHEN 325 MG PO TABS
650.0000 mg | ORAL_TABLET | Freq: Four times a day (QID) | ORAL | Status: DC | PRN
  Administered 2017-02-01: 650 mg via ORAL
  Filled 2017-01-28: qty 2

## 2017-01-28 MED ORDER — ONDANSETRON HCL 4 MG/2ML IJ SOLN
4.0000 mg | Freq: Once | INTRAMUSCULAR | Status: DC
  Filled 2017-01-28: qty 2

## 2017-01-28 MED ORDER — OXYCODONE-ACETAMINOPHEN 5-325 MG PO TABS
1.0000 | ORAL_TABLET | ORAL | Status: AC | PRN
  Administered 2017-01-30: 1 via ORAL
  Filled 2017-01-28 (×2): qty 1

## 2017-01-28 NOTE — ED Triage Notes (Addendum)
Pt states R side pain.  Pt states has been dx with gall stones.  Pt has been taking oxycodone that is no longer helping.  Dialysis MWF - missed today.  Pt appears in great pain.

## 2017-01-28 NOTE — Consult Note (Signed)
[facility] Surgery Consult Note  [name] Marks [birth year]  [id number].    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 Reports RUQ/right back pain 3 days ago that gradually improved and then returned less than 24 hours ago. Reports eating chicken salad last night for dinner followed by right upper back pain and nausea. Pain kept [name] Marks up last night and is what caused her to come to ED this morning. Pain is described as achy and intermittently sharp. aggravated by PO intake and relieved by narcotic meds. She denies fever, chills, emesis, or changes in bowel habits. Endorses bowel movements every day or every other day. She was seen by Dr. Kieth Brightly in 11/2016 for symptomatic cholelithiasis and at that time sxs were controlled with dietary modifications.   ED Workup: afebrile, WBC 7.0, Alk Phos 277 (from 311 10/2016) - other LFTs and lipase WNL, Cr 9.2/BUN 25 RUQ U/S with mild wall thickening, 2.2 cm stone present, and no pericholecystic fluid.   ROS: Review of Systems  Constitutional: Negative for chills and fever.  Gastrointestinal: Positive for abdominal pain and nausea. Negative for blood in stool, constipation, diarrhea and vomiting.  All other systems reviewed and are negative.   Family History  Problem Relation Age of Onset  . Diabetes Mother   . Hypertension Mother   . Heart disease Mother   . Alcohol abuse Mother   . Kidney disease Mother   . Cancer Father        COLON  . Alcohol abuse Father   . Breast cancer Sister 80  . Hypertension Sister   . Kidney disease Sister   . Hypertension Son   . Kidney disease Son     Past Medical History:  Diagnosis Date  . Anemia   .  Arthritis    "knees" (01/03/2017)  . Carpal tunnel syndrome   . Complication of anesthesia    01/01/17- '"a long time ago, difficulty breathimg, not sure if it was due to anesthesia or not."  . End stage renal disease on dialysis ([place])    "MWF; [place]." (01/03/2017)  . GERD (gastroesophageal reflux disease)   . Gout   . History of blood transfusion 2017   "related to blood poison" (01/03/2017)  . Osteoporosis 09/2016   T score -2.6  . Septic shock ([place])   . Staphylococcus aureus bacteremia   . TIA (transient ischemic attack)    "several" (01/03/2017)    Past Surgical History:  Procedure Laterality Date  . AORTIC VALVE REPLACEMENT N/A 02/25/2016   Procedure: AORTIC VALVE REPLACEMENT (AVR) implanted with Magna Ease Aortic valve size 24m;  Surgeon: SMelrose Nakayama MD;  [place] [facility]  Service: Open Heart Surgery;  Laterality: N/A;  . AV FISTULA PLACEMENT Left 01/03/2017   Procedure: INSERTION OF ARTERIOVENOUS (AV) GORE-TEX GRAFT LEFT THIGH;  Surgeon: BSerafina Mitchell MD;  [place]  Service: Vascular;  Laterality: Left;  . [place] Right 02/17/2016   Procedure: REMOVAL OF TWO ARTERIOVENOUS GORETEX GRAFTS ([place];  Surgeon: CAngelia Mould MD;  [place] [facility]  Service: Vascular;  Laterality: Right;  . BREAST BIOPSY Left 2013   stereo   . BREAST BIOPSY Right 2011   stereo   . CARDIAC VALVE  REPLACEMENT    . COLONOSCOPY W/ POLYPECTOMY    . DG AV DIALYSIS GRAFT DECLOT OR    . DILATATION & CURETTAGE/HYSTEROSCOPY WITH TRUECLEAR N/A 11/06/2012   Procedure: DILATATION & CURETTAGE/HYSTEROSCOPY WITH TRUECLEAR;  Surgeon: Terrance Mass, MD;  Location: [place] [facility];  Service: Gynecology;  Laterality: N/A;  Truclear Resectoscopic Polypectomy   . INSERTION OF DIALYSIS CATHETER N/A 02/19/2016   Procedure: INSERTION OF Left Internal Jugular DIALYSIS CATHETER;  Surgeon: Angelia Mould, MD;  Location: [place];  Service: Vascular;  Laterality: N/A;  . LOOP RECORDER  INSERTION N/A 10/01/2016   Procedure: LOOP RECORDER INSERTION;  Surgeon: Constance Haw, MD;  Location: [place] [facility];  Service: Cardiovascular;  Laterality: N/A;  . PATCH ANGIOPLASTY Right 02/17/2016   Procedure: PATCH ANGIOPLASTY;  Surgeon: Angelia Mould, MD;  Location: [place];  Service: Vascular;  Laterality: Right;  . PERIPHERAL VASCULAR CATHETERIZATION N/A 09/14/2014   Procedure: A/V Shuntogram/Fistulagram;  Surgeon: Katha Cabal, MD;  Location: [place] [facility];  Service: Cardiovascular;  Laterality: N/A;  . PERIPHERAL VASCULAR CATHETERIZATION N/A 09/14/2014   Procedure: A/V Shunt Intervention;  Surgeon: Katha Cabal, MD;  Location: [place] [facility];  Service: Cardiovascular;  Laterality: N/A;  . PERIPHERAL VASCULAR CATHETERIZATION Right 12/09/2014   Procedure: A/V Shuntogram/Fistulagram;  Surgeon: Algernon Huxley, MD;  Location: [place] [facility];  Service: Cardiovascular;  Laterality: Right;  . PERIPHERAL VASCULAR CATHETERIZATION N/A 12/09/2014   Procedure: A/V Shunt Intervention;  Surgeon: Algernon Huxley, MD;  Location: [place] [facility];  Service: Cardiovascular;  Laterality: N/A;  . PERIPHERAL VASCULAR CATHETERIZATION Right 05/24/2015   Procedure: A/V Shuntogram;  Surgeon: Serafina Mitchell, MD;  Location: [place] [facility];  Service: Cardiovascular;  Laterality: Right;  . PERIPHERAL VASCULAR CATHETERIZATION Right 05/24/2015   Procedure: Peripheral Vascular Balloon Angioplasty;  Surgeon: Serafina Mitchell, MD;  Location: [place] [facility];  Service: Cardiovascular;  Laterality: Right;  right arm shunt  . PERIPHERAL VASCULAR CATHETERIZATION N/A 06/13/2015   Procedure: A/V Shuntogram/Fistulagram;  Surgeon: Algernon Huxley, MD;  Location: [place] [facility];  Service: Cardiovascular;  Laterality: N/A;  . PERIPHERAL VASCULAR CATHETERIZATION N/A 06/13/2015   Procedure: A/V Shunt Intervention;  Surgeon: Algernon Huxley, MD;  Location: [place] [facility];  Service:  Cardiovascular;  Laterality: N/A;  . TEE WITHOUT CARDIOVERSION N/A 02/22/2016   Procedure: TRANSESOPHAGEAL ECHOCARDIOGRAM (TEE);  Surgeon: Pixie Casino, MD;  Location: [facility];  Service: Cardiovascular;  Laterality: N/A;  . TEE WITHOUT CARDIOVERSION N/A 02/25/2016   Procedure: TRANSESOPHAGEAL ECHOCARDIOGRAM (TEE);  Surgeon: Melrose Nakayama, MD;  Location: [place];  Service: Open Heart Surgery;  Laterality: N/A;  . TEE WITHOUT CARDIOVERSION N/A 10/01/2016   Procedure: TRANSESOPHAGEAL ECHOCARDIOGRAM (TEE);  Surgeon: Pixie Casino, MD;  Location: [facility]OSCOPY;  Service: Cardiovascular;  Laterality: N/A;  . TUBAL LIGATION  1983    Social History:  reports that  has never smoked. she has never used smokeless tobacco. She reports that she does not drink alcohol or use drugs.  Allergies:  Allergies  Allergen Reactions  . Contrast Media [Iodinated Diagnostic Agents] Anaphylaxis  . Ancef [Cefazolin] Other (See Comments)    Pt states she can not tolerate  . Naproxen Sodium Itching     (Not in a hospital admission)  Blood pressure (!) 120/59, pulse 72, temperature (!) 97.4 F (36.3 C), temperature source Oral, resp. rate 20, height 5' (1.524 m), weight 74.4 kg (164 lb), SpO2 99 %.  Physical Exam: Physical Exam  Constitutional: She is oriented to person, place, and time. She appears well-developed.  Non-toxic appearance. She does not appear ill. No distress.  HENT:  Head: Normocephalic and atraumatic.  Eyes: EOM are normal. No scleral icterus.  Cardiovascular: Normal rate, regular rhythm and normal heart sounds.  Pulmonary/Chest: Effort normal and breath sounds normal. No stridor. No respiratory distress. She has no wheezes. She has no rales.  Abdominal: Soft. Normal appearance and bowel sounds are normal. There is tenderness in the right upper quadrant.  mildy TTP RUQ. Negative murphy's  Neurological: She is alert and oriented to person, place, and time.  Skin: Skin is warm and  dry. No rash noted.  Psychiatric: She has a normal mood and affect. Her behavior is normal.    Results for orders placed or performed during the hospital encounter of 01/28/17 (from the past 48 hour(s))  Lipase, blood     Status: None   Collection Time: 01/28/17  8:00 AM  Result Value Ref Range   Lipase 32 11 - 51 U/L  Comprehensive metabolic panel     Status: Abnormal   Collection Time: 01/28/17  8:00 AM  Result Value Ref Range   Sodium 140 135 - 145 mmol/L   Potassium 4.2 3.5 - 5.1 mmol/L   Chloride 103 101 - 111 mmol/L   CO2 24 22 - 32 mmol/L   Glucose, Bld 102 (H) 65 - 99 mg/dL   BUN 25 (H) 6 - 20 mg/dL   Creatinine, Ser 9.20 (H) 0.44 - 1.00 mg/dL   Calcium 8.8 (L) 8.9 - 10.3 mg/dL   Total Protein 7.2 6.5 - 8.1 g/dL   Albumin 3.8 3.5 - 5.0 g/dL   AST 26 15 - 41 U/L   ALT 15 14 - 54 U/L   Alkaline Phosphatase 277 (H) 38 - 126 U/L   Total Bilirubin 0.6 0.3 - 1.2 mg/dL   GFR calc non Af Amer 4 (L) >60 mL/min   GFR calc Af Amer 5 (L) >60 mL/min    Comment: (NOTE) The eGFR has been calculated using the CKD EPI equation. This calculation has not been validated in all clinical situations. eGFR's persistently <60 mL/min signify possible Chronic Kidney Disease.    Anion gap 13 5 - 15  CBC     Status: Abnormal   Collection Time: 01/28/17  8:00 AM  Result Value Ref Range   WBC 7.0 4.0 - 10.5 K/uL   RBC 3.90 3.87 - 5.11 MIL/uL   Hemoglobin 10.8 (L) 12.0 - 15.0 g/dL   HCT 33.7 (L) 36.0 - 46.0 %   MCV 86.4 78.0 - 100.0 fL   MCH 27.7 26.0 - 34.0 pg   MCHC 32.0 30.0 - 36.0 g/dL   RDW 17.4 (H) 11.5 - 15.5 %   Platelets 272 150 - 400 K/uL   No results found. Assessment/Plan Symptomatic cholelithiasis vs chronic cholecystitis  Ultimately this [name] Marks would benefit from laparoscopic cholecystectomy due to worsening symptomatic cholelithiasis. She is afebrile, no leukocytosis, LFTs unchanged, and tolerating PO. If her pain has resolved in the ER she can go home with PO pain control  and outpatient follow up as soon as possible with Dr. Kieth Brightly in our office. I have sent his nurse an email to get the [name] Marks on his schedule.  If her pain is still present in the ED after pain meds a PO trial then I recommend holding her plavix and admission to the hospitalist service for management of her  multiple medical problems and medical/cardiac clearance prior to lap chole this hospital admission.  Jill Alexanders, [facility] Surgery 01/28/2017, 11:37 AM Pager: [phone] Consults: [phone] Mon-Fri 7:00 am-4:30 pm Sat-Sun 7:00 am-11:30 am

## 2017-01-28 NOTE — ED Provider Notes (Signed)
[facility] EMERGENCY DEPARTMENT Provider Note   CSN: [id number] Arrival date & time: 01/28/17  2694     History   Chief Complaint Chief Complaint  Patient presents with  . Abdominal Pain    HPI [name] Marks is a 61 y.o. female.  Patient presents for worsening right upper quadrant pain associated with eating, for 1 week.  She was previously diagnosed with gallstones in September 2018.  She denies nausea, vomiting, fever, chills, weakness or dizziness.  She is almost out of her oxycodone which she has been taking, for the pain.  She has seen general surgery who opted for observation, since she had a recent left thigh fistula placed, for dialysis.  She did 2 hours of dialysis today, but could not stay on the machine longer because of abdominal pain.  She is being dialyzed through a left chest vascular catheter.  In the emergency department her pain is mild.  There are no other known modifying factors.  HPI  Past Medical History:  Diagnosis Date  . Anemia   . Arthritis    "knees" (01/03/2017)  . Carpal tunnel syndrome   . Complication of anesthesia    01/01/17- '"a long time ago, difficulty breathimg, not sure if it was due to anesthesia or not."  . End stage renal disease on dialysis ([place])    "MWF; [facility]." (01/03/2017)  . GERD (gastroesophageal reflux disease)   . Gout   . History of blood transfusion 2017   "related to blood poison" (01/03/2017)  . Osteoporosis 09/2016   T score -2.6  . Septic shock ([place])   . Staphylococcus aureus bacteremia   . TIA (transient ischemic attack)    "several" (01/03/2017)    Patient Active Problem List   Diagnosis Date Noted  . ESRD (end stage renal disease) ([place]) 01/03/2017  . Osteoporosis 12/11/2016  . Cholelithiasis 11/07/2016  . Cerebral embolism with transient ischemic attack (TIA)   . TIA (transient ischemic attack)   . AVD (aortic valve disease) 07/12/2016  . Pulmonary HTN ([place]) 07/12/2016  . Carpal  tunnel syndrome 07/05/2016  . 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 CVA (cerebral vascular accident) ([place]) 05/16/2016  . History of aortic valve replacement with bioprosthetic valve 05/08/2016  . Aortic valve prosthesis present 02/25/2016  . Bacteremia due to coagulase-negative Staphylococcus   . S/P dialysis catheter insertion ([place])   . Bilateral low back pain with sciatica   . Neck pain   . Endocarditis   . Anemia   . Renal dialysis device, implant, or graft complication 85/46/2703  . Pain and swelling of right upper extremity 12/20/2015  . Complex endometrial hyperplasia with atypia 11/08/2015  . Increased endometrial stripe thickness 11/03/2015  . Obesity 10/28/2014  . Shoulder pain, bilateral 10/28/2014  . Health care maintenance 10/28/2014  . Right carotid bruit 01/08/2013  . Generalized headaches 01/08/2013  . Chronic female pelvic pain 08/08/2012  . Gout, unspecified 12/06/2008  . Esophageal reflux 12/06/2008  . BACK PAIN 12/06/2008  . FIBROIDS, UTERUS 11/24/2008  . ESRD (end stage renal disease) on dialysis ([place]) 07/01/2006    Past Surgical History:  Procedure Laterality Date  . AORTIC VALVE REPLACEMENT N/A 02/25/2016   Procedure: AORTIC VALVE REPLACEMENT (AVR) implanted with Magna Ease Aortic valve size 42mm;  Surgeon: [place] Nakayama, MD;  Location: [facility];  Service: Open Heart Surgery;  Laterality: N/A;  . AV FISTULA PLACEMENT Left 01/03/2017   Procedure: INSERTION OF ARTERIOVENOUS (AV) GORE-TEX GRAFT LEFT THIGH;  Surgeon: Serafina Mitchell, MD;  Location: [facility] OR;  Service: Vascular;  Laterality: Left;  . [place] REMOVAL Right 02/17/2016   Procedure: REMOVAL OF TWO ARTERIOVENOUS GORETEX GRAFTS ([place]);  Surgeon: Angelia Mould, MD;  Location: [facility];  Service: Vascular;  Laterality: Right;  . BREAST BIOPSY Left 2013   stereo   . BREAST BIOPSY Right 2011   stereo   . CARDIAC VALVE REPLACEMENT    . COLONOSCOPY W/ POLYPECTOMY    . DG AV DIALYSIS GRAFT DECLOT OR     . DILATATION & CURETTAGE/HYSTEROSCOPY WITH TRUECLEAR N/A 11/06/2012   Procedure: DILATATION & CURETTAGE/HYSTEROSCOPY WITH TRUECLEAR;  Surgeon: Terrance Mass, MD;  Location: [facility];  Service: Gynecology;  Laterality: N/A;  Truclear Resectoscopic Polypectomy   . INSERTION OF DIALYSIS CATHETER N/A 02/19/2016   Procedure: INSERTION OF Left Internal Jugular DIALYSIS CATHETER;  Surgeon: Angelia Mould, MD;  Location: [facility];  Service: Vascular;  Laterality: N/A;  . LOOP RECORDER INSERTION N/A 10/01/2016   Procedure: LOOP RECORDER INSERTION;  Surgeon: Constance Haw, MD;  Location: [facility];  Service: Cardiovascular;  Laterality: N/A;  . PATCH ANGIOPLASTY Right 02/17/2016   Procedure: PATCH ANGIOPLASTY;  Surgeon: Angelia Mould, MD;  Location: [facility];  Service: Vascular;  Laterality: Right;  . PERIPHERAL VASCULAR CATHETERIZATION N/A 09/14/2014   Procedure: A/V Shuntogram/Fistulagram;  Surgeon: Katha Cabal, MD;  Location: [facility];  Service: Cardiovascular;  Laterality: N/A;  . PERIPHERAL VASCULAR CATHETERIZATION N/A 09/14/2014   Procedure: A/V Shunt Intervention;  Surgeon: Katha Cabal, MD;  Location: [facility];  Service: Cardiovascular;  Laterality: N/A;  . PERIPHERAL VASCULAR CATHETERIZATION Right 12/09/2014   Procedure: A/V Shuntogram/Fistulagram;  Surgeon: Algernon Huxley, MD;  Location: [facility];  Service: Cardiovascular;  Laterality: Right;  . PERIPHERAL VASCULAR CATHETERIZATION N/A 12/09/2014   Procedure: A/V Shunt Intervention;  Surgeon: Algernon Huxley, MD;  Location: [facility];  Service: Cardiovascular;  Laterality: N/A;  . PERIPHERAL VASCULAR CATHETERIZATION Right 05/24/2015   Procedure: A/V Shuntogram;  Surgeon: Serafina Mitchell, MD;  Location: [facility];  Service: Cardiovascular;  Laterality: Right;  . PERIPHERAL VASCULAR CATHETERIZATION Right 05/24/2015   Procedure: Peripheral Vascular Balloon Angioplasty;   Surgeon: Serafina Mitchell, MD;  Location: [facility];  Service: Cardiovascular;  Laterality: Right;  right arm shunt  . PERIPHERAL VASCULAR CATHETERIZATION N/A 06/13/2015   Procedure: A/V Shuntogram/Fistulagram;  Surgeon: Algernon Huxley, MD;  Location: [facility];  Service: Cardiovascular;  Laterality: N/A;  . PERIPHERAL VASCULAR CATHETERIZATION N/A 06/13/2015   Procedure: A/V Shunt Intervention;  Surgeon: Algernon Huxley, MD;  Location: [facility];  Service: Cardiovascular;  Laterality: N/A;  . TEE WITHOUT CARDIOVERSION N/A 02/22/2016   Procedure: TRANSESOPHAGEAL ECHOCARDIOGRAM (TEE);  Surgeon: Pixie Casino, MD;  Location: [facility] ENDOSCOPY;  Service: Cardiovascular;  Laterality: N/A;  . TEE WITHOUT CARDIOVERSION N/A 02/25/2016   Procedure: TRANSESOPHAGEAL ECHOCARDIOGRAM (TEE);  Surgeon: [place] Nakayama, MD;  Location: [facility];  Service: Open Heart Surgery;  Laterality: N/A;  . TEE WITHOUT CARDIOVERSION N/A 10/01/2016   Procedure: TRANSESOPHAGEAL ECHOCARDIOGRAM (TEE);  Surgeon: Pixie Casino, MD;  Location[facility] ENDOSCOPY;  Service: Cardiovascular;  Laterality: N/A;  . TUBAL LIGATION  1983    OB History    Gravida Para Term Preterm AB Living   2 2       2    SAB TAB Ectopic Multiple Live Births  Home Medications    Prior to Admission medications   Medication Sig Start Date End Date Taking? Authorizing Provider  cinacalcet (SENSIPAR) 60 MG tablet Take 120 mg by mouth every Monday, Wednesday, and Friday with hemodialysis.    Yes [provider]  clopidogrel (PLAVIX) 75 MG tablet Take 1 tablet (75 mg total) by mouth daily. 01/23/17  Yes Mikell, Jeani Sow, MD  famotidine (PEPCID) 20 MG tablet Take 20 mg by mouth 2 (two) times daily. Lunch and dinner   Yes [provider]  fluticasone (FLONASE) 50 MCG/ACT nasal spray Place 2 sprays into both nostrils daily. 11/24/16  Yes Tereasa Coop, PA-C  ipratropium (ATROVENT) 0.06 % nasal spray  Place 2 sprays into both nostrils 4 (four) times daily. Patient taking differently: Place 2 sprays into both nostrils 3 (three) times daily as needed for rhinitis.  11/24/16  Yes Tereasa Coop, PA-C  midodrine (PROAMATINE) 10 MG tablet Take 1 tablet (10 mg total) by mouth 2 (two) times daily as needed (30 min prior to HD). 08/27/16  Yes Mikell, Jeani Sow, MD  Multiple Vitamins-Minerals (PRORENAL QD PO) Take 1 tablet by mouth daily.    Yes [provider]  sevelamer (RENVELA) 800 MG tablet Take 800 mg by mouth 3 (three) times daily with meals.    Yes [provider]  oxyCODONE (ROXICODONE) 5 MG immediate release tablet Take 0.5 tablets (2.5 mg total) by mouth every 6 (six) hours as needed for severe pain. Patient not taking: Reported on 01/28/2017 10/28/16   Fatima Blank, MD  oxyCODONE (ROXICODONE) 5 MG immediate release tablet Take 0.5-1 tablets (2.5-5 mg total) every 4 (four) hours as needed by mouth for severe pain. Patient not taking: Reported on 01/28/2017 01/04/17   Ulyses Amor, PA-C    Family History Family History  Problem Relation Age of Onset  . Diabetes Mother   . Hypertension Mother   . Heart disease Mother   . Alcohol abuse Mother   . Kidney disease Mother   . Cancer Father        COLON  . Alcohol abuse Father   . Breast cancer Sister 81  . Hypertension Sister   . Kidney disease Sister   . Hypertension Son   . Kidney disease Son     Social History Social History   Tobacco Use  . Smoking status: Never Smoker  . Smokeless tobacco: Never Used  Substance Use Topics  . Alcohol use: No  . Drug use: No     Allergies   Contrast media [iodinated diagnostic agents]; Ancef [cefazolin]; and Naproxen sodium   Review of Systems Review of Systems  All other systems reviewed and are negative.    Physical Exam Updated Vital Signs BP 116/78   Pulse 70   Temp (!) 97.4 F (36.3 C) (Oral)   Resp 16   Ht 5' (1.524 m)   Wt 74.4 kg (164 lb)    SpO2 98%   BMI 32.03 kg/m   Physical Exam  Constitutional: She is oriented to person, place, and time. She appears well-developed. She does not appear ill.  Appears older than stated age.  HENT:  Head: Normocephalic and atraumatic.  Eyes: Conjunctivae and EOM are normal. Pupils are equal, round, and reactive to light.  Neck: Normal range of motion and phonation normal. Neck supple.  Cardiovascular: Normal rate and regular rhythm.  Vascular catheter left upper chest wall, site and appliance appears normal  Pulmonary/Chest: Effort normal and breath sounds  normal. She exhibits no tenderness.  Abdominal: Soft. She exhibits no distension. There is no tenderness. There is no rigidity, no rebound and no guarding.  Musculoskeletal: Normal range of motion.  Neurological: She is alert and oriented to person, place, and time. She exhibits normal muscle tone.  Skin: Skin is warm and dry.  Psychiatric: She has a normal mood and affect. Her behavior is normal. Judgment and thought content normal.  Nursing note and vitals reviewed.    ED Treatments / Results  Labs (all labs ordered are listed, but only abnormal results are displayed) Labs Reviewed  COMPREHENSIVE METABOLIC PANEL - Abnormal; Notable for the following components:      Result Value   Glucose, Bld 102 (*)    BUN 25 (*)    Creatinine, Ser 9.20 (*)    Calcium 8.8 (*)    Alkaline Phosphatase 277 (*)    GFR calc non Af Amer 4 (*)    GFR calc Af Amer 5 (*)    All other components within normal limits  CBC - Abnormal; Notable for the following components:   Hemoglobin 10.8 (*)    HCT 33.7 (*)    RDW 17.4 (*)    All other components within normal limits  LIPASE, BLOOD    EKG  EKG Interpretation None       Radiology US Abdomen Limited Ruq  Result Date: 01/28/2017 CLINICAL DATA:  Right upper quadrant pain EXAM: ULTRASOUND ABDOMEN LIMITED RIGHT UPPER QUADRANT COMPARISON:  10/28/2016 FINDINGS: Gallbladder: 2.2 cm irregular  gallstone is present in the gallbladder. Mild gallbladder wall thickening. Positive sonographic Murphy sign. No pericholecystic fluid. Common bile duct: Diameter: 5 mm Liver: The liver is diffusely increased in echogenicity. There is no obvious nodularity. No focal mass. The portal vein is patent with bidirectional flow. Flow was both hepatopedal and hepatofugal. IMPRESSION: Cholelithiasis There is gallbladder wall thickening and positive sonographic Murphy sign. Acute cholecystitis is not excluded. Correlate clinically as for the need for nuclear medicine imaging to assess for cystic duct patency. Diffusely increased echogenicity in the liver. Differential diagnosis includes diffuse hepatic steatosis and diffuse hepatic parenchymal disease There is bidirectional flow in the portal vein. Reversal of flow is seen with portal hypertension. Electronically Signed   By: Marybelle Killings M.D.   On: 01/28/2017 12:24    Procedures Procedures (including critical care time)  Medications Ordered in ED Medications  oxyCODONE-acetaminophen (PERCOCET/ROXICET) 5-325 MG per tablet 1 tablet (1 tablet Oral Given 01/28/17 0759)  fentaNYL (SUBLIMAZE) injection 50 mcg (not administered)  ondansetron (ZOFRAN) injection 4 mg (not administered)     Initial Impression / Assessment and Plan / ED Course  I have reviewed the triage vital signs and the nursing notes.  Pertinent labs & imaging results that were available during my care of the patient were reviewed by me and considered in my medical decision making (see chart for details).  Clinical Course as of Jan 28 1329  Mon Jan 28, 2017  1113 The case was discussed with general surgery who advises that the patient be evaluated for cholecystitis with abdominal ultrasound.  If she has cholecystitis she will require admission, if not she can be seen as an outpatient to be scheduled for elective surgery, and will be required to be off Plavix for that surgery.  [EW]    Clinical  Course User Index [EW] Daleen Bo, MD     Patient Vitals for the past 24 hrs:  BP Temp Temp src Pulse Resp SpO2 Height  Weight  01/28/17 1300 116/78 - - 70 16 98 % - -  01/28/17 1230 125/71 - - 69 13 99 % - -  01/28/17 1215 134/60 - - 72 13 99 % - -  01/28/17 1202 100/67 - - 70 17 98 % - -  01/28/17 1130 123/90 - - 69 15 100 % - -  01/28/17 1112 (!) 120/59 - - 72 20 99 % - -  01/28/17 1100 - - - 79 (!) 23 98 % - -  01/28/17 1009 (!) 119/59 (!) 97.4 F (36.3 C) Oral 74 20 98 % - -  01/28/17 0733 (!) 131/53 97.9 F (36.6 C) Oral 74 16 100 % 5' (1.524 m) 74.4 kg (164 lb)    1:30 PM Reevaluation with update and discussion. After initial assessment and treatment, an updated evaluation reveals patient is uncomfortable complaining of right upper quadrant pain, and desires pain medications.  Based on the findings, ongoing pain will require admission for stabilization and cholecystectomy.  Patient is agreeable to this plan. Daleen Bo   1:33 PM-Consult complete with on-call resident. Patient case explained and discussed.  They agree to admit patient for further evaluation and treatment. Call ended at 1:50 PM  Final Clinical Impressions(s) / ED Diagnoses   Final diagnoses:  Cholecystitis    Symptomatic cholelithiasis with cholecystitis.  Symptoms are progressive over the last week.  Patient will require hospitalization for pain control, and have Plavix discontinued.  She states that her last Plavix dose, was on 01/26/17.  Doubt serious bacterial infection, metabolic instability or impending vascular collapse.  Nursing Notes Reviewed/ Care Coordinated Applicable Imaging Reviewed Interpretation of Laboratory Data incorporated into ED treatment   Plan: [place]  ED Discharge Orders    None       Daleen Bo, MD 01/28/17 1356

## 2017-01-28 NOTE — Consult Note (Signed)
Reason for Consult: To manage dialysis and dialysis related needs Referring Physician: Dr. Mingo Amber, Family Medicine  [name] Marks is an 61 y.o. female.  HPI:  Pt is a 11F with a PMH of ESRD on HD MWF, h/o S lugdunensis bacteremia s/p AVR, R thigh AVG removed (infected), and h/o TIA 09/2016 p/w abd pain.    Pt has a known h/o gallstones which were diagnosed 10/2016.  Has been managing them with pain meds.  Has been getting worse since Thanksgiving.  Could only run 2 hrs dialysis Friday due to pain.  Could not run today.  Came to ED for eval.  RUQ ultrasound showing cholecystitis.  Scheduled for cholecystectomy Wednesday.  We are asked to see for management of ESRD and provision of HD.  Dialyzes at [facility] MWF 4 hrs EDW 74 kg 3K 2.5 Ca bath F180 dialyzer  Access: L thigh AVG healing, TDC in use now Mircera 100 q 2 weeks Calcitriol 1 mcg TIW Sensipar 120 mg TIW    Past Medical History:  Diagnosis Date  . Anemia   . Arthritis    "knees" (01/03/2017)  . Carpal tunnel syndrome   . Complication of anesthesia    01/01/17- '"a long time ago, difficulty breathimg, not sure if it was due to anesthesia or not."  . End stage renal disease on dialysis ([place])    "MWF; [place]." (01/03/2017)  . GERD (gastroesophageal reflux disease)   . Gout   . History of blood transfusion 2017   "related to blood poison" (01/03/2017)  . Osteoporosis 09/2016   T score -2.6  . Septic shock ([place])   . Staphylococcus aureus bacteremia   . TIA (transient ischemic attack)    "several" (01/03/2017)    Past Surgical History:  Procedure Laterality Date  . AORTIC VALVE REPLACEMENT N/A 02/25/2016   Procedure: AORTIC VALVE REPLACEMENT (AVR) implanted with Magna Ease Aortic valve size 66m;  Surgeon: SMelrose Nakayama MD;  [facility]  Service: Open Heart Surgery;  Laterality: N/A;  . AV FISTULA PLACEMENT Left 01/03/2017   Procedure: INSERTION OF ARTERIOVENOUS (AV) GORE-TEX GRAFT LEFT THIGH;  Surgeon:  BSerafina Mitchell MD;  [place] [facility]  Service: Vascular;  Laterality: Left;  . [place]AL Right 02/17/2016   Procedure: REMOVAL OF TWO ARTERIOVENOUS GORETEX GRAFTS ([place];  Surgeon: CAngelia Mould MD;  [place]  Service: Vascular;  Laterality: Right;  . BREAST BIOPSY Left 2013   stereo   . BREAST BIOPSY Right 2011   stereo   . CARDIAC VALVE REPLACEMENT    . COLONOSCOPY W/ POLYPECTOMY    . DG AV DIALYSIS GRAFT DECLOT OR    . DILATATION & CURETTAGE/HYSTEROSCOPY WITH TRUECLEAR N/A 11/06/2012   Procedure: DILATATION & CURETTAGE/HYSTEROSCOPY WITH TRUECLEAR;  Surgeon: JTerrance Mass MD;  [place];  Service: Gynecology;  Laterality: N/A;  Truclear Resectoscopic Polypectomy   . INSERTION OF DIALYSIS CATHETER N/A 02/19/2016   Procedure: INSERTION OF Left Internal Jugular DIALYSIS CATHETER;  Surgeon: CAngelia Mould MD;  [place]  Service: Vascular;  Laterality: N/A;  . LOOP RECORDER INSERTION N/A 10/01/2016   Procedure: LOOP RECORDER INSERTION;  Surgeon: CConstance Haw MD;  [facility];  Service: Cardiovascular;  Laterality: N/A;  . PATCH ANGIOPLASTY Right 02/17/2016   Procedure: PATCH ANGIOPLASTY;  Surgeon: CAngelia Mould MD;  [place] [facility]  Service: Vascular;  Laterality: Right;  . PERIPHERAL VASCULAR CATHETERIZATION N/A 09/14/2014   Procedure: A/V Shuntogram/Fistulagram;  Surgeon: Katha Cabal, MD;  Location: [place] CV LAB;  Service: Cardiovascular;  Laterality: N/A;  . PERIPHERAL VASCULAR CATHETERIZATION N/A 09/14/2014   Procedure: A/V Shunt Intervention;  Surgeon: Katha Cabal, MD;  Location: [place] [facility];  Service: Cardiovascular;  Laterality: N/A;  . PERIPHERAL VASCULAR CATHETERIZATION Right 12/09/2014   Procedure: A/V Shuntogram/Fistulagram;  Surgeon: Algernon Huxley, MD;  Location: [place] [facility] LAB;  Service: Cardiovascular;  Laterality: Right;  . PERIPHERAL VASCULAR CATHETERIZATION N/A  12/09/2014   Procedure: A/V Shunt Intervention;  Surgeon: Algernon Huxley, MD;  Location: [place] [facility] LAB;  Service: Cardiovascular;  Laterality: N/A;  . PERIPHERAL VASCULAR CATHETERIZATION Right 05/24/2015   Procedure: A/V Shuntogram;  Surgeon: Serafina Mitchell, MD;  Location: [place] [facility] LAB;  Service: Cardiovascular;  Laterality: Right;  . PERIPHERAL VASCULAR CATHETERIZATION Right 05/24/2015   Procedure: Peripheral Vascular Balloon Angioplasty;  Surgeon: Serafina Mitchell, MD;  Location: [place] [facility];  Service: Cardiovascular;  Laterality: Right;  right arm shunt  . PERIPHERAL VASCULAR CATHETERIZATION N/A 06/13/2015   Procedure: A/V Shuntogram/Fistulagram;  Surgeon: Algernon Huxley, MD;  Location: [place] [facility];  Service: Cardiovascular;  Laterality: N/A;  . PERIPHERAL VASCULAR CATHETERIZATION N/A 06/13/2015   Procedure: A/V Shunt Intervention;  Surgeon: Algernon Huxley, MD;  Location: [place] [facility] LAB;  Service: Cardiovascular;  Laterality: N/A;  . TEE WITHOUT CARDIOVERSION N/A 02/22/2016   Procedure: TRANSESOPHAGEAL ECHOCARDIOGRAM (TEE);  Surgeon: Pixie Casino, MD;  Location: [facility] ENDOSCOPY;  Service: Cardiovascular;  Laterality: N/A;  . TEE WITHOUT CARDIOVERSION N/A 02/25/2016   Procedure: TRANSESOPHAGEAL ECHOCARDIOGRAM (TEE);  Surgeon: Melrose Nakayama, MD;  Location: [place];  Service: Open Heart Surgery;  Laterality: N/A;  . TEE WITHOUT CARDIOVERSION N/A 10/01/2016   Procedure: TRANSESOPHAGEAL ECHOCARDIOGRAM (TEE);  Surgeon: Pixie Casino, MD;  Location: [facility] ENDOSCOPY;  Service: Cardiovascular;  Laterality: N/A;  . TUBAL LIGATION  1983    Family History  Problem Relation Age of Onset  . Diabetes Mother   . Hypertension Mother   . Heart disease Mother   . Alcohol abuse Mother   . Kidney disease Mother   . Cancer Father        COLON  . Alcohol abuse Father   . Breast cancer Sister 30  . Hypertension Sister   . Kidney disease Sister   . Hypertension Son   . Kidney  disease Son     Social History:  reports that  has never smoked. she has never used smokeless tobacco. She reports that she does not drink alcohol or use drugs.  Allergies:  Allergies  Allergen Reactions  . Contrast Media [Iodinated Diagnostic Agents] Anaphylaxis  . Ancef [Cefazolin] Other (See Comments)    Pt states she can not tolerate  . Naproxen Sodium Itching     Current Facility-Administered Medications:  .  0.9 %  sodium chloride infusion, 250 mL, Intravenous, PRN, Caroline More, DO .  acetaminophen (TYLENOL) tablet 650 mg, 650 mg, Oral, Q6H PRN **OR** acetaminophen (TYLENOL) suppository 650 mg, 650 mg, Rectal, Q6H PRN, Caroline More, DO .  Derrill Memo ON 01/30/2017] cinacalcet (SENSIPAR) tablet 120 mg, 120 mg, Oral, Q M,W,F-HD, Tammi Klippel, Sherin, DO .  famotidine (PEPCID) tablet 20 mg, 20 mg, Oral, BID, Abraham, Sherin, DO, 20 mg at 01/28/17 1520 .  fentaNYL (SUBLIMAZE) injection 50 mcg, 50 mcg, Intravenous, Q2H PRN, Tammi Klippel, Sherin, DO .  heparin injection 5,000 Units, 5,000 Units, Subcutaneous, Q8H, Abraham, Sherin, DO, 5,000 Units at  01/28/17 1520 .  ipratropium (ATROVENT) 0.06 % nasal spray 2 spray, 2 spray, Each Nare, QID, Abraham, Sherin, DO .  midodrine (PROAMATINE) tablet 10 mg, 10 mg, Oral, BID PRN, Tammi Klippel, Sherin, DO .  ondansetron (ZOFRAN) tablet 4 mg, 4 mg, Oral, Q12H PRN, Tammi Klippel, Sherin, DO .  oxyCODONE-acetaminophen (PERCOCET/ROXICET) 5-325 MG per tablet 1 tablet, 1 tablet, Oral, Q4H PRN, Tammi Klippel, Sherin, DO .  polyethylene glycol (MIRALAX / GLYCOLAX) packet 17 g, 17 g, Oral, Daily PRN, Tammi Klippel, Sherin, DO .  sevelamer carbonate (RENVELA) tablet 800 mg, 800 mg, Oral, TID WC, Abraham, Sherin, DO .  sodium chloride flush (NS) 0.9 % injection 3 mL, 3 mL, Intravenous, Q12H, Abraham, Sherin, DO, 3 mL at 01/28/17 1523 .  sodium chloride flush (NS) 0.9 % injection 3 mL, 3 mL, Intravenous, PRN, Caroline More, DO  Current Outpatient Medications:  .  cinacalcet (SENSIPAR)  60 MG tablet, Take 120 mg by mouth every Monday, Wednesday, and Friday with hemodialysis. , Disp: , Rfl:  .  clopidogrel (PLAVIX) 75 MG tablet, Take 1 tablet (75 mg total) by mouth daily., Disp: 30 tablet, Rfl: 3 .  famotidine (PEPCID) 20 MG tablet, Take 20 mg by mouth 2 (two) times daily. Lunch and dinner, Disp: , Rfl:  .  fluticasone (FLONASE) 50 MCG/ACT nasal spray, Place 2 sprays into both nostrils daily., Disp: 9.9 g, Rfl: 2 .  ipratropium (ATROVENT) 0.06 % nasal spray, Place 2 sprays into both nostrils 4 (four) times daily. (Patient taking differently: Place 2 sprays into both nostrils 3 (three) times daily as needed for rhinitis. ), Disp: 15 mL, Rfl: 12 .  midodrine (PROAMATINE) 10 MG tablet, Take 1 tablet (10 mg total) by mouth 2 (two) times daily as needed (30 min prior to HD)., Disp: 60 tablet, Rfl: 0 .  Multiple Vitamins-Minerals (PRORENAL QD PO), Take 1 tablet by mouth daily. , Disp: , Rfl:  .  sevelamer (RENVELA) 800 MG tablet, Take 800 mg by mouth 3 (three) times daily with meals. , Disp: , Rfl:  .  oxyCODONE (ROXICODONE) 5 MG immediate release tablet, Take 0.5 tablets (2.5 mg total) by mouth every 6 (six) hours as needed for severe pain. (Patient not taking: Reported on 01/28/2017), Disp: 10 tablet, Rfl: 0 .  oxyCODONE (ROXICODONE) 5 MG immediate release tablet, Take 0.5-1 tablets (2.5-5 mg total) every 4 (four) hours as needed by mouth for severe pain. (Patient not taking: Reported on 01/28/2017), Disp: 6 tablet, Rfl: 0    Results for orders placed or performed during the hospital encounter of 01/28/17 (from the past 48 hour(s))  Lipase, blood     Status: None   Collection Time: 01/28/17  8:00 AM  Result Value Ref Range   Lipase 32 11 - 51 U/L  Comprehensive metabolic panel     Status: Abnormal   Collection Time: 01/28/17  8:00 AM  Result Value Ref Range   Sodium 140 135 - 145 mmol/L   Potassium 4.2 3.5 - 5.1 mmol/L   Chloride 103 101 - 111 mmol/L   CO2 24 22 - 32 mmol/L    Glucose, Bld 102 (H) 65 - 99 mg/dL   BUN 25 (H) 6 - 20 mg/dL   Creatinine, Ser 9.20 (H) 0.44 - 1.00 mg/dL   Calcium 8.8 (L) 8.9 - 10.3 mg/dL   Total Protein 7.2 6.5 - 8.1 g/dL   Albumin 3.8 3.5 - 5.0 g/dL   AST 26 15 - 41 U/L   ALT 15 14 - 54  U/L   Alkaline Phosphatase 277 (H) 38 - 126 U/L   Total Bilirubin 0.6 0.3 - 1.2 mg/dL   GFR calc non Af Amer 4 (L) >60 mL/min   GFR calc Af Amer 5 (L) >60 mL/min    Comment: (NOTE) The eGFR has been calculated using the CKD EPI equation. This calculation has not been validated in all clinical situations. eGFR's persistently <60 mL/min signify possible Chronic Kidney Disease.    Anion gap 13 5 - 15  CBC     Status: Abnormal   Collection Time: 01/28/17  8:00 AM  Result Value Ref Range   WBC 7.0 4.0 - 10.5 K/uL   RBC 3.90 3.87 - 5.11 MIL/uL   Hemoglobin 10.8 (L) 12.0 - 15.0 g/dL   HCT 33.7 (L) 36.0 - 46.0 %   MCV 86.4 78.0 - 100.0 fL   MCH 27.7 26.0 - 34.0 pg   MCHC 32.0 30.0 - 36.0 g/dL   RDW 17.4 (H) 11.5 - 15.5 %   Platelets 272 150 - 400 K/uL    US Abdomen Limited Ruq  Result Date: 01/28/2017 CLINICAL DATA:  Right upper quadrant pain EXAM: ULTRASOUND ABDOMEN LIMITED RIGHT UPPER QUADRANT COMPARISON:  10/28/2016 FINDINGS: Gallbladder: 2.2 cm irregular gallstone is present in the gallbladder. Mild gallbladder wall thickening. Positive sonographic Murphy sign. No pericholecystic fluid. Common bile duct: Diameter: 5 mm Liver: The liver is diffusely increased in echogenicity. There is no obvious nodularity. No focal mass. The portal vein is patent with bidirectional flow. Flow was both hepatopedal and hepatofugal. IMPRESSION: Cholelithiasis There is gallbladder wall thickening and positive sonographic Murphy sign. Acute cholecystitis is not excluded. Correlate clinically as for the need for nuclear medicine imaging to assess for cystic duct patency. Diffusely increased echogenicity in the liver. Differential diagnosis includes diffuse hepatic  steatosis and diffuse hepatic parenchymal disease There is bidirectional flow in the portal vein. Reversal of flow is seen with portal hypertension. Electronically Signed   By: Marybelle Killings M.D.   On: 01/28/2017 12:24    ROS: all other systems reviewed and are negative except as per HPI Blood pressure (!) 121/57, pulse (!) 58, temperature (!) 97.4 F (36.3 C), temperature source Oral, resp. rate 10, height 5' (1.524 m), weight 74.4 kg (164 lb), SpO2 94 %. .  GEN NAD, lying in bed HEENT EOMI PERRL MMM NECK mild JVD PULM clear bilaterally no c/w/r CV RRR no m/r/g crisp S2 ABD RUQ tenderness EXT no LE edema NEURO AAO x 3 SKIN no rashes or lesions ACCESS: L thigh AVG + bruit, L TDC in place   Assessment/Plan: 1 Cholecystitis: for cholecystectomy Wednesday.  Per [facility]  2 ESRD: normally MWF, will convert to TTS schedule here.   3 Hypertension: no BP meds, on midodrine 10 mg BID 4. Anemia of ESRD: Last got Mircera 11/28, not due.  Last OP Hgb 10.8 5. Metabolic Bone Disease: on renvela 1 tab TID with meals, Calcitriol 1 mcg TIW and Sensipar 120 TIW.  6.  Nutrition: alb 3.8, may go down postop 7.  H/o TIA: holding Plavix for OR 8.  H/o AV endocarditis- s/p replacement. 9.  Dispo: pending resolution of worsening of biliary colic.  Madelon Lips 01/28/2017, 3:57 PM

## 2017-01-28 NOTE — H&P (Signed)
[facility] Admission History and Physical Service Pager: [phone]  Patient name: [name] Marks Medical record number: [id number] Date of birth: 03-02-1955 Age: 61 y.o. Gender: female  Primary Care Provider: Tonette Bihari, MD Consultants: surgery Code Status: FULL  Chief Complaint: RUQ pain  Assessment and Plan: [name] Marks is a 61 y.o. female presenting with RUQ pain. PMH is significant for ESRD on MWF schedule, endocarditis s/p aortic valve replacement, and cholelithiasis.   Cholelithiasis Patient with history of cholelithiasis with established follow up with outpatient surgery. Patient was last seen by Dr. Kieth Brightly in October 2018 for symptomatic cholelithiasis at which time symptoms were controlled by dietary modifications. Patient has been afebrile with no leukocytosis. Patient was seen by surgery while in ED, plan for possible surgery on 01/30/17.  -admit to inpatient service, attending Dr. Mingo Amber -hold plavix for surgery -heparin for DVT prophylaxis, will hold on day of surgery  -tylenol prn  -K pad prn  -percocet 1 tablet q4hrs as needed -44mcg fentanyl as needed for breakthrough pain -surgery consulted, plan for surgery on Wednesday. Appreciate recommendations.  -vital signs per routine  -zofran prn   ESRD, MWF HD Patient with history of MWF dialysis. Patient missed HD today, and has had shortened HD sessions recently due to increased pain. Currently being dialyzed through left chest vascular catheter with recent left thigh fistula placement. -nephrology consulted, appreciate recommendation  -no urgent need for dialysis, lytes stable  Hx of aortic valve replacement  Patient with history of aortic valve replacement in 2017 2/2 to endocarditis. Currently on plavix for anticoagulation -will hold plavix pending surgery  -heparin for DVT prophylaxis, will discontinue 12 hrs prior to surgery  GERD Home medications: pepcid 20mg   bid -continue home medications   FEN/GI: renal diet Prophylaxis: heparin  Disposition: admit to inpatient for pain management and surgery  History of Present Illness:  [name] Marks is a 61 y.o. female presenting with stomach pain. Patient states that pain is chronic but got severe enough today that she was unable to undergo dialysis and had to come straight to ED.   Patient has a known history of gallstones and had been followed by surgery as an outpatient. Was informed by surgery to come into hospital if pain gets worse. Pain has become more persistent and unbearable so she came to ED. Patient is ESRD on MWF dialysis but did not go to HD today. She states she has recently had to leave HD early because abdominal pain becomes worse when on the machine.   Patient describes pain as occurring in right upper side of abdomen radiating to her back. Patient states that pain is only made better with pain medication, patient was recently see in ED and given prescription for oxycodone. She has been taking oxycodone at home with relief but ran out of this medication. Patient is unsure what makes it worse, but does not associate increased pain with certain foods. Patient notes that it is hard to get comfortable. Pain is intermittent, but when it does come it is hard to relieve and makes it difficult to sleep.   Patient is currently on plavix after heart surgery/stents. She is unsure when she last took it, possibly Sunday 01/27/17, but patient is sure that she did not take it today.   Review Of Systems: Per HPI with the following additions:   Review of Systems  Constitutional: Positive for chills. Negative for fever.  HENT: Negative for congestion and sore throat.  Eyes: Negative for blurred vision and double vision.  Respiratory: Negative for cough and shortness of breath.   Cardiovascular: Negative for chest pain and leg swelling.  Gastrointestinal: Positive for abdominal pain, constipation and  nausea. Negative for diarrhea and vomiting.       Decreased appetite   Genitourinary:       Does not make urine   Musculoskeletal: Positive for back pain.  Skin: Negative for rash.  Neurological: Negative for dizziness, sensory change, speech change and focal weakness.  Psychiatric/Behavioral: Negative for substance abuse.    Patient Active Problem List   Diagnosis Date Noted  . Cholelithiases 01/28/2017  . ESRD (end stage renal disease) ([place]) 01/03/2017  . Osteoporosis 12/11/2016  . Cholelithiasis 11/07/2016  . Cerebral embolism with transient ischemic attack (TIA)   . TIA (transient ischemic attack)   . AVD (aortic valve disease) 07/12/2016  . Pulmonary HTN ([place]) 07/12/2016  . Carpal tunnel syndrome 07/05/2016  . 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 CVA (cerebral vascular accident) ([place]) 05/16/2016  . History of aortic valve replacement with bioprosthetic valve 05/08/2016  . Aortic valve prosthesis present 02/25/2016  . Bacteremia due to coagulase-negative Staphylococcus   . S/P dialysis catheter insertion ([place])   . Bilateral low back pain with sciatica   . Neck pain   . Endocarditis   . Anemia   . Renal dialysis device, implant, or graft complication 37/05/8887  . Pain and swelling of right upper extremity 12/20/2015  . Complex endometrial hyperplasia with atypia 11/08/2015  . Increased endometrial stripe thickness 11/03/2015  . Obesity 10/28/2014  . Shoulder pain, bilateral 10/28/2014  . Health care maintenance 10/28/2014  . Right carotid bruit 01/08/2013  . Generalized headaches 01/08/2013  . Chronic female pelvic pain 08/08/2012  . Gout, unspecified 12/06/2008  . Esophageal reflux 12/06/2008  . BACK PAIN 12/06/2008  . FIBROIDS, UTERUS 11/24/2008  . ESRD (end stage renal disease) on dialysis ([place]) 07/01/2006    Past Medical History: Past Medical History:  Diagnosis Date  . Anemia   . Arthritis    "knees" (01/03/2017)  . Carpal tunnel syndrome   .  Complication of anesthesia    01/01/17- '"a long time ago, difficulty breathimg, not sure if it was due to anesthesia or not."  . End stage renal disease on dialysis ([place])    "MWF; [place]." (01/03/2017)  . GERD (gastroesophageal reflux disease)   . Gout   . History of blood transfusion 2017   "related to blood poison" (01/03/2017)  . Osteoporosis 09/2016   T score -2.6  . Septic shock ([place])   . Staphylococcus aureus bacteremia   . TIA (transient ischemic attack)    "several" (01/03/2017)    Past Surgical History: Past Surgical History:  Procedure Laterality Date  . AORTIC VALVE REPLACEMENT N/A 02/25/2016   Procedure: AORTIC VALVE REPLACEMENT (AVR) implanted with Magna Ease Aortic valve size 44mm;  Surgeon: Melrose Nakayama, MD;  Location: [place];  Service: Open Heart Surgery;  Laterality: N/A;  . AV FISTULA PLACEMENT Left 01/03/2017   Procedure: INSERTION OF ARTERIOVENOUS (AV) GORE-TEX GRAFT LEFT THIGH;  Surgeon: Serafina Mitchell, MD;  Location: [place];  Service: Vascular;  Laterality: Left;  . [place] REMOVAL Right 02/17/2016   Procedure: REMOVAL OF TWO ARTERIOVENOUS GORETEX GRAFTS ([place]);  Surgeon: Angelia Mould, MD;  Location: [place];  Service: Vascular;  Laterality: Right;  . BREAST BIOPSY Left 2013   stereo   . BREAST BIOPSY Right 2011   stereo   .  CARDIAC VALVE REPLACEMENT    . COLONOSCOPY W/ POLYPECTOMY    . DG AV DIALYSIS GRAFT DECLOT OR    . DILATATION & CURETTAGE/HYSTEROSCOPY WITH TRUECLEAR N/A 11/06/2012   Procedure: DILATATION & CURETTAGE/HYSTEROSCOPY WITH TRUECLEAR;  Surgeon: Terrance Mass, MD;  Location: [facility];  Service: Gynecology;  Laterality: N/A;  Truclear Resectoscopic Polypectomy   . INSERTION OF DIALYSIS CATHETER N/A 02/19/2016   Procedure: INSERTION OF Left Internal Jugular DIALYSIS CATHETER;  Surgeon: Angelia Mould, MD;  Location: [place];  Service: Vascular;  Laterality: N/A;  . LOOP RECORDER INSERTION N/A 10/01/2016   Procedure: LOOP  RECORDER INSERTION;  Surgeon: Constance Haw, MD;  Location: [place] [facility];  Service: Cardiovascular;  Laterality: N/A;  . PATCH ANGIOPLASTY Right 02/17/2016   Procedure: PATCH ANGIOPLASTY;  Surgeon: Angelia Mould, MD;  Location: [place];  Service: Vascular;  Laterality: Right;  . PERIPHERAL VASCULAR CATHETERIZATION N/A 09/14/2014   Procedure: A/V Shuntogram/Fistulagram;  Surgeon: Katha Cabal, MD;  Location: [place] [facility];  Service: Cardiovascular;  Laterality: N/A;  . PERIPHERAL VASCULAR CATHETERIZATION N/A 09/14/2014   Procedure: A/V Shunt Intervention;  Surgeon: Katha Cabal, MD;  Location: [place] [facility];  Service: Cardiovascular;  Laterality: N/A;  . PERIPHERAL VASCULAR CATHETERIZATION Right 12/09/2014   Procedure: A/V Shuntogram/Fistulagram;  Surgeon: Algernon Huxley, MD;  Location: [place] [facility];  Service: Cardiovascular;  Laterality: Right;  . PERIPHERAL VASCULAR CATHETERIZATION N/A 12/09/2014   Procedure: A/V Shunt Intervention;  Surgeon: Algernon Huxley, MD;  Location: [place] [facility];  Service: Cardiovascular;  Laterality: N/A;  . PERIPHERAL VASCULAR CATHETERIZATION Right 05/24/2015   Procedure: A/V Shuntogram;  Surgeon: Serafina Mitchell, MD;  Location: [place] [facility];  Service: Cardiovascular;  Laterality: Right;  . PERIPHERAL VASCULAR CATHETERIZATION Right 05/24/2015   Procedure: Peripheral Vascular Balloon Angioplasty;  Surgeon: Serafina Mitchell, MD;  Location: [place] [facility];  Service: Cardiovascular;  Laterality: Right;  right arm shunt  . PERIPHERAL VASCULAR CATHETERIZATION N/A 06/13/2015   Procedure: A/V Shuntogram/Fistulagram;  Surgeon: Algernon Huxley, MD;  Location: [place] [facility];  Service: Cardiovascular;  Laterality: N/A;  . PERIPHERAL VASCULAR CATHETERIZATION N/A 06/13/2015   Procedure: A/V Shunt Intervention;  Surgeon: Algernon Huxley, MD;  Location: [place] [facility];  Service: Cardiovascular;  Laterality: N/A;  . TEE WITHOUT  CARDIOVERSION N/A 02/22/2016   Procedure: TRANSESOPHAGEAL ECHOCARDIOGRAM (TEE);  Surgeon: Pixie Casino, MD;  Location: [facility];  Service: Cardiovascular;  Laterality: N/A;  . TEE WITHOUT CARDIOVERSION N/A 02/25/2016   Procedure: TRANSESOPHAGEAL ECHOCARDIOGRAM (TEE);  Surgeon: Melrose Nakayama, MD;  Location: [place];  Service: Open Heart Surgery;  Laterality: N/A;  . TEE WITHOUT CARDIOVERSION N/A 10/01/2016   Procedure: TRANSESOPHAGEAL ECHOCARDIOGRAM (TEE);  Surgeon: Pixie Casino, MD;  Location: [place];  Service: Cardiovascular;  Laterality: N/A;  . TUBAL LIGATION  1983    Social History: Social History   Tobacco Use  . Smoking status: Never Smoker  . Smokeless tobacco: Never Used  Substance Use Topics  . Alcohol use: No  . Drug use: No   Additional social history: lives by herself  Please also refer to relevant sections of EMR.  Family History: Family History  Problem Relation Age of Onset  . Diabetes Mother   . Hypertension Mother   . Heart disease Mother   . Alcohol abuse Mother   . Kidney disease Mother   . Cancer Father  COLON  . Alcohol abuse Father   . Breast cancer Sister 62  . Hypertension Sister   . Kidney disease Sister   . Hypertension Son   . Kidney disease Son    Allergies and Medications: Allergies  Allergen Reactions  . Contrast Media [Iodinated Diagnostic Agents] Anaphylaxis  . Ancef [Cefazolin] Other (See Comments)    Pt states she can not tolerate  . Naproxen Sodium Itching   No current facility-administered medications on file prior to encounter.    Current Outpatient Medications on File Prior to Encounter  Medication Sig Dispense Refill  . cinacalcet (SENSIPAR) 60 MG tablet Take 120 mg by mouth every Monday, Wednesday, and Friday with hemodialysis.     Marland Kitchen clopidogrel (PLAVIX) 75 MG tablet Take 1 tablet (75 mg total) by mouth daily. 30 tablet 3  . famotidine (PEPCID) 20 MG tablet Take 20 mg by mouth 2 (two) times daily.  Lunch and dinner    . fluticasone (FLONASE) 50 MCG/ACT nasal spray Place 2 sprays into both nostrils daily. 9.9 g 2  . ipratropium (ATROVENT) 0.06 % nasal spray Place 2 sprays into both nostrils 4 (four) times daily. (Patient taking differently: Place 2 sprays into both nostrils 3 (three) times daily as needed for rhinitis. ) 15 mL 12  . midodrine (PROAMATINE) 10 MG tablet Take 1 tablet (10 mg total) by mouth 2 (two) times daily as needed (30 min prior to HD). 60 tablet 0  . Multiple Vitamins-Minerals (PRORENAL QD PO) Take 1 tablet by mouth daily.     . sevelamer (RENVELA) 800 MG tablet Take 800 mg by mouth 3 (three) times daily with meals.     Marland Kitchen oxyCODONE (ROXICODONE) 5 MG immediate release tablet Take 0.5 tablets (2.5 mg total) by mouth every 6 (six) hours as needed for severe pain. (Patient not taking: Reported on 01/28/2017) 10 tablet 0  . oxyCODONE (ROXICODONE) 5 MG immediate release tablet Take 0.5-1 tablets (2.5-5 mg total) every 4 (four) hours as needed by mouth for severe pain. (Patient not taking: Reported on 01/28/2017) 6 tablet 0    Objective: BP (!) 121/57   Pulse (!) 58   Temp (!) 97.4 F (36.3 C) (Oral)   Resp 10   Ht 5' (1.524 m)   Wt 164 lb (74.4 kg)   SpO2 94%   BMI 32.03 kg/m  Exam: General: awake and alert, laying in bed, in NAD Eyes: PERRL ENTM: moist mucous membranes  Cardiovascular: RRR, 3/6 systolic murmur Respiratory: CTAB, no wheezes, rales or rhonchi  Gastrointestinal: soft, tender to palpation of LLQ and RUQ, positive Murphy's sign, bowel sounds x 4 quadrants  MSK: no edema, non tender Derm: scarring on chest from cardiac procedure Neuro: sensation intact bilaterally, 5/5 muscle strength  Psych: normal affect   Labs and Imaging: CBC BMET  Recent Labs  Lab 01/28/17 0800  WBC 7.0  HGB 10.8*  HCT 33.7*  PLT 272   Recent Labs  Lab 01/28/17 0800  NA 140  K 4.2  CL 103  CO2 24  BUN 25*  CREATININE 9.20*  GLUCOSE 102*  CALCIUM 8.8*     US  Abdomen Limited Ruq  Result Date: 01/28/2017 CLINICAL DATA:  Right upper quadrant pain EXAM: ULTRASOUND ABDOMEN LIMITED RIGHT UPPER QUADRANT COMPARISON:  10/28/2016 FINDINGS: Gallbladder: 2.2 cm irregular gallstone is present in the gallbladder. Mild gallbladder wall thickening. Positive sonographic Murphy sign. No pericholecystic fluid. Common bile duct: Diameter: 5 mm Liver: The liver is diffusely increased in echogenicity. There  is no obvious nodularity. No focal mass. The portal vein is patent with bidirectional flow. Flow was both hepatopedal and hepatofugal. IMPRESSION: Cholelithiasis There is gallbladder wall thickening and positive sonographic Murphy sign. Acute cholecystitis is not excluded. Correlate clinically as for the need for nuclear medicine imaging to assess for cystic duct patency. Diffusely increased echogenicity in the liver. Differential diagnosis includes diffuse hepatic steatosis and diffuse hepatic parenchymal disease There is bidirectional flow in the portal vein. Reversal of flow is seen with portal hypertension. Electronically Signed   By: Marybelle Killings M.D.   On: 01/28/2017 12:24     Caroline More, DO 01/28/2017, 4:20 PM PGY-1, [place] Intern pager: [phone], text pages welcome   FPTS Upper-Level Resident Addendum  I have independently interviewed and examined the patient. I have discussed the above with the original author and agree with their documentation. My edits for correction/addition/clarification are in pink.Please see also any attending notes.   Lucila Maine, DO PGY-2, [place] Service pager: [phone] (text pages welcome through [place])

## 2017-01-28 NOTE — ED Notes (Signed)
Pt's dinner tray arrived. 

## 2017-01-28 NOTE — ED Notes (Signed)
Patient transported to Ultrasound 

## 2017-01-29 ENCOUNTER — Other Ambulatory Visit: Payer: Self-pay

## 2017-01-29 ENCOUNTER — Ambulatory Visit (INDEPENDENT_AMBULATORY_CARE_PROVIDER_SITE_OTHER): Payer: Medicare Other | Admitting: *Deleted

## 2017-01-29 ENCOUNTER — Other Ambulatory Visit (HOSPITAL_COMMUNITY): Payer: Medicare Other

## 2017-01-29 ENCOUNTER — Encounter (HOSPITAL_COMMUNITY): Payer: Self-pay | Admitting: General Practice

## 2017-01-29 DIAGNOSIS — I639 Cerebral infarction, unspecified: Secondary | ICD-10-CM | POA: Diagnosis not present

## 2017-01-29 DIAGNOSIS — N186 End stage renal disease: Secondary | ICD-10-CM

## 2017-01-29 DIAGNOSIS — Z01810 Encounter for preprocedural cardiovascular examination: Secondary | ICD-10-CM

## 2017-01-29 DIAGNOSIS — Z952 Presence of prosthetic heart valve: Secondary | ICD-10-CM

## 2017-01-29 LAB — RENAL FUNCTION PANEL
ALBUMIN: 3.2 g/dL — AB (ref 3.5–5.0)
Anion gap: 15 (ref 5–15)
BUN: 32 mg/dL — AB (ref 6–20)
CALCIUM: 8.4 mg/dL — AB (ref 8.9–10.3)
CHLORIDE: 101 mmol/L (ref 101–111)
CO2: 23 mmol/L (ref 22–32)
CREATININE: 10.6 mg/dL — AB (ref 0.44–1.00)
GFR, EST AFRICAN AMERICAN: 4 mL/min — AB (ref >60)
GFR, EST NON AFRICAN AMERICAN: 3 mL/min — AB (ref >60)
Glucose, Bld: 86 mg/dL (ref 65–99)
PHOSPHORUS: 4.6 mg/dL (ref 2.5–4.6)
Potassium: 4.5 mmol/L (ref 3.5–5.1)
Sodium: 139 mmol/L (ref 135–145)

## 2017-01-29 LAB — CBC
HCT: 31.9 % — ABNORMAL LOW (ref 36.0–46.0)
Hemoglobin: 10.2 g/dL — ABNORMAL LOW (ref 12.0–15.0)
MCH: 27.6 pg (ref 26.0–34.0)
MCHC: 32 g/dL (ref 30.0–36.0)
MCV: 86.4 fL (ref 78.0–100.0)
PLATELETS: 235 10*3/uL (ref 150–400)
RBC: 3.69 MIL/uL — AB (ref 3.87–5.11)
RDW: 17.5 % — AB (ref 11.5–15.5)
WBC: 5.1 10*3/uL (ref 4.0–10.5)

## 2017-01-29 MED ORDER — CIPROFLOXACIN IN D5W 400 MG/200ML IV SOLN
400.0000 mg | INTRAVENOUS | Status: AC
  Administered 2017-01-30: 400 mg via INTRAVENOUS
  Filled 2017-01-29 (×2): qty 200

## 2017-01-29 MED ORDER — CALCITRIOL 0.5 MCG PO CAPS
1.0000 ug | ORAL_CAPSULE | ORAL | Status: DC | PRN
  Administered 2017-02-01: 1 ug via ORAL
  Filled 2017-01-29 (×2): qty 2

## 2017-01-29 NOTE — Progress Notes (Signed)
Initial Nutrition Assessment  DOCUMENTATION CODES:   Obesity unspecified  INTERVENTION:   -Snacks TID  NUTRITION DIAGNOSIS:   Increased nutrient needs related to post-op healing as evidenced by estimated needs.  GOAL:   Patient will meet greater than or equal to 90% of their needs  MONITOR:   PO intake, Labs, Weight trends, Skin, I & O's  REASON FOR ASSESSMENT:   Malnutrition Screening Tool    ASSESSMENT:   [name] Marks is a 61 y.o. female presenting with RUQ pain. PMH is significant for ESRD on MWF schedule, endocarditis s/p aortic valve replacement, and cholelithiasis.   Pt admitted with cholelithiasis.   Spoke with pt, who reports that she has been experiencing poor appetite and early satiety over the past year. She shares that her food intake has been "trial and error" over the past year to learn which foods she can tolerate (pt relates this to her gallbladder issues). Pt forces herself to eat, even though she is not hungry; typical meal intake is 2-3 times per day (Breakfast: cereal, lunch and dinner: large chef salad). She has been focusing on consuming low fiber carbohydrates and protein, as those are the foods that she can tolerate the most.   Observed breakfast tray; pt consumed about 25% of scrambled eggs and 100% of muffin.  Pt endorses wt loss; estimating she has lost about 30-50# over the past year. Per wt hx, pt has experienced a 31% wt loss over the past year, which is significant for time frame. Pt reports she underwent an extensive hospitalization approximately one year which left her immobile, however, has been able to regain most of her mobility back (walks with cane assist). She shares that she has become more tired and weak doing tasks (pt cooked an entire Thanksgiving meal for her family 2 weeks ago, and reports extreme fatigue from this).   Noted muscle depletion in lower extremities, likely related to prior mobility losses.   Pt usually consumes  large portions of meat to assist with protein intake. She does not tolerate supplements well historically. She is trying to increase intake in preparation for surgery (per surgical notes, lap chole will be scheduled within the next two days). Discussed importance of good meal completion to assist with healing.   Medications reviewed and include renvela.   Labs reviewed.   NUTRITION - FOCUSED PHYSICAL EXAM:    Most Recent Value  Orbital Region  No depletion  Upper Arm Region  No depletion  Thoracic and Lumbar Region  No depletion  Buccal Region  No depletion  Temple Region  No depletion  Clavicle Bone Region  No depletion  Clavicle and Acromion Bone Region  No depletion  Scapular Bone Region  No depletion  Dorsal Hand  No depletion  Patellar Region  Mild depletion  Anterior Thigh Region  No depletion  Posterior Calf Region  Mild depletion  Edema (RD Assessment)  Mild  Hair  Reviewed  Eyes  Reviewed  Mouth  Reviewed  Skin  Reviewed  Nails  Reviewed       Diet Order:  Diet renal with fluid restriction Fluid restriction: 1200 mL Fluid; Room service appropriate? Yes; Fluid consistency: Thin Diet NPO time specified Except for: Sips with Meds, Ice Chips  EDUCATION NEEDS:   Education needs have been addressed  Skin:  Skin Assessment: Reviewed RN Assessment  Last BM:  01/28/17  Height:   Ht Readings from Last 1 Encounters:  01/28/17 5' (1.524 m)    Weight:  Wt Readings from Last 1 Encounters:  01/29/17 158 lb 4.6 oz (71.8 kg)    Ideal Body Weight:  45.5 kg  BMI:  Body mass index is 30.91 kg/m.  Estimated Nutritional Needs:   Kcal:  1600-1800  Protein:  90-105 grams  Fluid:  1-1.5 L    [name] Marks, RD, LDN, CDE Pager: [phone] After hours Pager: [phone]

## 2017-01-29 NOTE — Progress Notes (Signed)
Patient arrived back from dialysis.

## 2017-01-29 NOTE — H&P (View-Only) (Signed)
[facility] Progress Note     Subjective: CC:  Currently in HD. Reports RUQ pain is improved compared to yesterday. Denies nausea/vomiting.   Objective: Vital signs in last 24 hours: Temp:  [97.9 F (36.6 C)-98.6 F (37 C)] 98.6 F (37 C) (12/04 0800) Pulse Rate:  [52-88] 52 (12/04 0930) Resp:  [10-23] 17 (12/04 0930) BP: (100-152)/(53-90) 127/54 (12/04 0930) SpO2:  [90 %-100 %] 98 % (12/04 0930) Weight:  [73.6 kg (162 lb 4.1 oz)] 73.6 kg (162 lb 4.1 oz) (12/04 0800) Last BM Date: 01/26/17  Intake/Output from previous day: 12/03 0701 - 12/04 0700 In: 60 [P.O.:60] Out: -  Intake/Output this shift: No intake/output data recorded.  PE: Gen:  Alert, NAD, pleasant HEENT: pupils equal and round, EOMs in tact, anicteric sclerae  Card:  Regular rate and rhythm Pulm:  Normal effort, clear to auscultation bilaterally Abd: Soft, TTP RUQ without peritonitis, non-distended, bowel sounds present in all 4 quadrants Skin: warm and dry, no rashes  Psych: A&Ox3   Lab Results:  Recent Labs    01/28/17 0800 01/29/17 0436  WBC 7.0 5.1  HGB 10.8* 10.2*  HCT 33.7* 31.9*  PLT 272 235   BMET Recent Labs    01/28/17 0800 01/29/17 0436  NA 140 139  K 4.2 4.5  CL 103 101  CO2 24 23  GLUCOSE 102* 86  BUN 25* 32*  CREATININE 9.20* 10.60*  CALCIUM 8.8* 8.4*   PT/INR No results for input(s): LABPROT, INR in the last 72 hours. CMP     Component Value Date/Time   NA 139 01/29/2017 0436   NA 139 12/17/2012   NA 138 07/04/2011 1058   K 4.5 01/29/2017 0436   K 4.9 05/19/2012 0937   CL 101 01/29/2017 0436   CL 103 07/04/2011 1058   CO2 23 01/29/2017 0436   CO2 29 07/04/2011 1058   GLUCOSE 86 01/29/2017 0436   GLUCOSE 91 07/04/2011 1058   BUN 32 (H) 01/29/2017 0436   BUN 34 (A) 12/17/2012   BUN 18 07/04/2011 1058   CREATININE 10.60 (H) 01/29/2017 0436   CREATININE 5.85 (H) 10/28/2014 0918   CALCIUM 8.4 (L) 01/29/2017 0436   CALCIUM 10.2 (H) 07/04/2011 1058   PROT 7.2 01/28/2017 0800   ALBUMIN 3.2 (L) 01/29/2017 0436   AST 26 01/28/2017 0800   ALT 15 01/28/2017 0800   ALKPHOS 277 (H) 01/28/2017 0800   BILITOT 0.6 01/28/2017 0800   GFRNONAA 3 (L) 01/29/2017 0436   GFRNONAA 7 (L) 07/04/2011 1058   GFRAA 4 (L) 01/29/2017 0436   GFRAA 8 (L) 07/04/2011 1058   Lipase     Component Value Date/Time   LIPASE 32 01/28/2017 0800       Studies/Results: US Abdomen Limited Ruq  Result Date: 01/28/2017 CLINICAL DATA:  Right upper quadrant pain EXAM: ULTRASOUND ABDOMEN LIMITED RIGHT UPPER QUADRANT COMPARISON:  10/28/2016 FINDINGS: Gallbladder: 2.2 cm irregular gallstone is present in the gallbladder. Mild gallbladder wall thickening. Positive sonographic Murphy sign. No pericholecystic fluid. Common bile duct: Diameter: 5 mm Liver: The liver is diffusely increased in echogenicity. There is no obvious nodularity. No focal mass. The portal vein is patent with bidirectional flow. Flow was both hepatopedal and hepatofugal. IMPRESSION: Cholelithiasis There is gallbladder wall thickening and positive sonographic Murphy sign. Acute cholecystitis is not excluded. Correlate clinically as for the need for nuclear medicine imaging to assess for cystic duct patency. Diffusely increased echogenicity in the liver. Differential diagnosis includes diffuse hepatic steatosis and  diffuse hepatic parenchymal disease There is bidirectional flow in the portal vein. Reversal of flow is seen with portal hypertension. Electronically Signed   By: Marybelle Killings M.D.   On: 01/28/2017 12:24    Anti-infectives: Anti-infectives (From admission, onward)   None     Assessment/Plan ESRD on HD - Renal following. HD today. Hx endocarditis and aorticbovine valve replacement GERD Hx TIA   Symptomatic cholelithiasis vs chronic cholecystitis  - afebrile, VSS, no leukocytosis - worsening biliary colic x 1 week with associated poor oral intake.  - RUQ U/S 12/3 with mild GB wall thickening,  2.2 cm stone, positive sonographic Murphy's.  - Plavix held and I have contacted cardiology to consult for pre-operative cardiac clearance.  - plan for lap chole tomorrow vs Thursday, will confirm timing of surgery with MD.   FEN: Renal, NPO after MN ID: perioperative Ancef VTE: SCD's, SQ heparin   LOS: 1 day    Jill Alexanders , [facility] 01/29/2017, 10:54 AM Pager: [phone] Consults: [phone] Mon-Fri 7:00 am-4:30 pm Sat-Sun 7:00 am-11:30 am

## 2017-01-29 NOTE — Progress Notes (Signed)
Family Medicine Teaching Service Daily Progress Note Intern Pager: [phone]  Patient name: [name] Marks Medical record number: [id number] Date of birth: Jan 30, 1956 Age: 61 y.o. Gender: female  Primary Care Provider: Tonette Bihari, MD Consultants: general surgery  Code Status: full   Pt Overview and Major Events to Date:  Admitted to [facility] on 01/28/17  Assessment and Plan: [name] Marks is a 61 y.o. female presenting with RUQ pain. PMH is significant for ESRD on MWF schedule, endocarditis s/p aortic valve replacement, and cholelithiasis.   Cholelithiasis Patient with history of cholelithiasis with established follow up with outpatient surgery. Patient was last seen by Dr. Kieth Brightly in October 2018 for symptomatic cholelithiasis at which time symptoms were controlled by dietary modifications. Patient has been afebrile with no leukocytosis. Patient was seen by surgery while in ED, plan for possible surgery on 01/30/17. Patient notes improvement in pain since admission.  -admit to inpatient service, attending Dr. Mingo Amber -hold plavix for surgery -heparin for DVT prophylaxis, will hold on day of surgery  -tylenol prn  -K pad prn  -percocet 1 tablet q4hrs as needed -61mcg fentanyl as needed for breakthrough pain -surgery consulted, plan for surgery on Wednesday. Appreciate recommendations.  -vital signs per routine  -zofran prn  -cardiology consulted for cardiac clearance   ESRD, MWF HD Patient with history of MWF dialysis. Patient missed HD today, and has had shortened HD sessions recently due to increased pain. Currently being dialyzed through left chest vascular catheter with recent left thigh fistula placement. Per nephrology, will be coverted to TTS schedule while inpatient. Patient received HD today.  -nephrology consulted, appreciate recommendation   Hx of aortic valve replacement  Patient with history of aortic valve replacement in 2017 2/2 to endocarditis. Currently on  plavix for anticoagulation. Unclear why on anticoagulation, possible stent vs. Clotting of HD fistula.  -will hold plavix pending surgery  -heparin for DVT prophylaxis, will discontinue 12 hrs prior to surgery  GERD Home medications: pepcid 20mg  bid -continue home medications    Disposition: awaiting surgery   Subjective:  Patient today with no complaints. States she no longer has abdominal pain. Patient states that she was able to eat [place] and chicken salad and was able to tolerate diet. Patient denies nausea, vomiting diarrhea.   Objective: Temp:  [97.9 F (36.6 C)-98.6 F (37 C)] 98.6 F (37 C) (12/04 0800) Pulse Rate:  [52-88] 66 (12/04 1100) Resp:  [10-23] 20 (12/04 1100) BP: (99-152)/(45-85) 108/45 (12/04 1100) SpO2:  [90 %-100 %] 98 % (12/04 0930) Weight:  [162 lb 4.1 oz (73.6 kg)] 162 lb 4.1 oz (73.6 kg) (12/04 0800) Physical Exam: General: awake and alert, laying in HD bed  Cardiovascular: RRR, no MRG Respiratory: CTAB, no wheezes, rales, or rhonchi  Abdomen: soft, non tender, +Murphy's sign  Extremities: no edema, non tender   Laboratory: Recent Labs  Lab 01/28/17 0800 01/29/17 0436  WBC 7.0 5.1  HGB 10.8* 10.2*  HCT 33.7* 31.9*  PLT 272 235   Recent Labs  Lab 01/28/17 0800 01/29/17 0436  NA 140 139  K 4.2 4.5  CL 103 101  CO2 24 23  BUN 25* 32*  CREATININE 9.20* 10.60*  CALCIUM 8.8* 8.4*  PROT 7.2  --   BILITOT 0.6  --   ALKPHOS 277*  --   ALT 15  --   AST 26  --   GLUCOSE 102* 86    Imaging/Diagnostic Tests: US Abdomen Limited Ruq  Result Date: 01/28/2017 CLINICAL DATA:  Right upper quadrant pain EXAM: ULTRASOUND ABDOMEN LIMITED RIGHT UPPER QUADRANT COMPARISON:  10/28/2016 FINDINGS: Gallbladder: 2.2 cm irregular gallstone is present in the gallbladder. Mild gallbladder wall thickening. Positive sonographic Murphy sign. No pericholecystic fluid. Common bile duct: Diameter: 5 mm Liver: The liver is diffusely increased in echogenicity. There  is no obvious nodularity. No focal mass. The portal vein is patent with bidirectional flow. Flow was both hepatopedal and hepatofugal. IMPRESSION: Cholelithiasis There is gallbladder wall thickening and positive sonographic Murphy sign. Acute cholecystitis is not excluded. Correlate clinically as for the need for nuclear medicine imaging to assess for cystic duct patency. Diffusely increased echogenicity in the liver. Differential diagnosis includes diffuse hepatic steatosis and diffuse hepatic parenchymal disease There is bidirectional flow in the portal vein. Reversal of flow is seen with portal hypertension. Electronically Signed   By: Marybelle Killings M.D.   On: 01/28/2017 12:24     Caroline More, DO 01/29/2017, 12:11 PM PGY-1, [place] Intern pager: [phone], text pages welcome

## 2017-01-29 NOTE — Procedures (Signed)
Patient seen and examined on Hemodialysis. QB 400 L IJ TDC  UF goal 2L  Tolerating well.  Treatment adjusted as needed.  Madelon Lips MD [facility] pgr [phone] 10:04 AM

## 2017-01-29 NOTE — Discharge Summary (Signed)
[facility] Discharge Summary  Patient name: [name] Marks Medical record number: [id number] Date of birth: 04-16-1955 Age: 61 y.o. Gender: female Date of Admission: 01/28/2017  Date of Discharge: 02/01/17 Admitting Physician: Alveda Reasons, MD  Primary Care Provider: Tonette Bihari, MD Consultants: general surgery   Indication for Hospitalization: cholelithiasis   Discharge Diagnoses/Problem List:  Cholelithiasis  ESRD, MWF HD Hx of aortic valve replacement  GERD  Disposition: to home with home health   Discharge Condition: stable, improving  Discharge Exam:  Please see progress note from date of discharge   Brief Hospital Course:  Ciani A Marks is a 61 y.o. female presenting with increased RUQ pain. Patient has history of cholelithiasis with established follow up with outpatient surgery. Patient was afebrile with no leukocytosis during admission. Patient was scheduled for lap chole on 12/5 with general surgery. Home plavix was held on admission due to surgery and restarted on 12/6. Patient was transitioned to diet and tolerated diet prior to discharge.   During admission patient received continued HD, on TThS schedule.   Issues for Follow Up:  1. Continue HD on MWF schedule  2. Follow up with general surgery 3. Follow up for constipation - given miralax prn on discharge 4. Follow up on RUQ pain, pain improved prior to discharge. Likely 2/2 recent surgery   Significant Procedures: Laparoscopic cholecystectomy   Significant Labs and Imaging:  Recent Labs  Lab 01/29/17 0436 01/31/17 0631 02/01/17 1151  WBC 5.1 10.5 8.1  HGB 10.2* 11.2* 10.6*  HCT 31.9* 34.1* 32.7*  PLT 235 222 186   Recent Labs  Lab 01/28/17 0800 01/29/17 0436 01/30/17 0625 01/31/17 0608 02/01/17 0537  NA 140 139 136 134* 133*  K 4.2 4.5 4.1 5.0 4.1  CL 103 101 96* 96* 95*  CO2 24 23 26 26 29   GLUCOSE 102* 86 96 113* 91  BUN 25* 32* 13 21* 15   CREATININE 9.20* 10.60* 5.86* 7.30* 4.84*  CALCIUM 8.8* 8.4* 8.8* 9.0 8.7*  MG  --   --   --   --  1.7  PHOS  --  4.6 3.6 4.2 3.5  ALKPHOS 277*  --   --   --  207*  AST 26  --   --   --  23  ALT 15  --   --   --  15  ALBUMIN 3.8 3.2* 3.2* 3.0* 2.7*    US Abdomen Limited Ruq  Result Date: 01/28/2017 CLINICAL DATA:  Right upper quadrant pain EXAM: ULTRASOUND ABDOMEN LIMITED RIGHT UPPER QUADRANT COMPARISON:  10/28/2016 FINDINGS: Gallbladder: 2.2 cm irregular gallstone is present in the gallbladder. Mild gallbladder wall thickening. Positive sonographic Murphy sign. No pericholecystic fluid. Common bile duct: Diameter: 5 mm Liver: The liver is diffusely increased in echogenicity. There is no obvious nodularity. No focal mass. The portal vein is patent with bidirectional flow. Flow was both hepatopedal and hepatofugal. IMPRESSION: Cholelithiasis There is gallbladder wall thickening and positive sonographic Murphy sign. Acute cholecystitis is not excluded. Correlate clinically as for the need for nuclear medicine imaging to assess for cystic duct patency. Diffusely increased echogenicity in the liver. Differential diagnosis includes diffuse hepatic steatosis and diffuse hepatic parenchymal disease There is bidirectional flow in the portal vein. Reversal of flow is seen with portal hypertension. Electronically Signed   By: Marybelle Killings M.D.   On: 01/28/2017 12:24     Results/Tests Pending at Time of Discharge:  Unresulted Labs (From admission, onward)  Start     Ordered   01/29/17 0500  Renal function panel  Daily,   R     01/28/17 1450      Discharge Medications:  Allergies as of 02/01/2017      Reactions   Contrast Media [iodinated Diagnostic Agents] Anaphylaxis   Ancef [cefazolin] Other (See Comments)   Pt states she can not tolerate   Naproxen Sodium Itching      Medication List    TAKE these medications   cinacalcet 60 MG tablet Commonly known as:  SENSIPAR Take 120 mg by mouth  every Monday, Wednesday, and Friday with hemodialysis.   clopidogrel 75 MG tablet Commonly known as:  PLAVIX Take 1 tablet (75 mg total) by mouth daily.   famotidine 20 MG tablet Commonly known as:  PEPCID Take 20 mg by mouth 2 (two) times daily. Lunch and dinner   fluticasone 50 MCG/ACT nasal spray Commonly known as:  FLONASE Place 2 sprays into both nostrils daily.   ipratropium 0.06 % nasal spray Commonly known as:  ATROVENT Place 2 sprays into both nostrils 4 (four) times daily. What changed:    when to take this  reasons to take this   midodrine 10 MG tablet Commonly known as:  PROAMATINE Take 1 tablet (10 mg total) by mouth 2 (two) times daily as needed (30 min prior to HD).   oxyCODONE 5 MG immediate release tablet Commonly known as:  ROXICODONE Take 0.5 tablets (2.5 mg total) by mouth every 6 (six) hours as needed for severe pain.   oxyCODONE 5 MG immediate release tablet Commonly known as:  ROXICODONE Take 0.5-1 tablets (2.5-5 mg total) every 4 (four) hours as needed by mouth for severe pain.   polyethylene glycol packet Commonly known as:  MIRALAX / GLYCOLAX Take 17 g by mouth daily as needed for mild constipation.   PRORENAL QD PO Take 1 tablet by mouth daily.   RENVELA 800 MG tablet Generic drug:  sevelamer carbonate Take 800 mg by mouth 3 (three) times daily with meals.       Discharge Instructions: Please refer to Patient Instructions section of EMR for full details.  Patient was counseled important signs and symptoms that should prompt return to medical care, changes in medications, dietary instructions, activity restrictions, and follow up appointments.   Follow-Up Appointments: Follow-up Information    Shirley, [name], DO. Go on 02/05/2017.   Specialty:  Family Medicine Why:  @4pm . Please arrive 15 min early  Contact information: [address] [place] [address] [facility], [place]. Go on  02/14/2017.   Specialty:  General Surgery Why:  at 3:00 PM for post-operative follow up. please arrive 30 minutes early.  Contact information: [address] [place] [facility].   Why:  home health Contact information: [place] [address] [place], [place], DO 02/01/2017, 2:58 PM PGY-1, [place]

## 2017-01-29 NOTE — Progress Notes (Signed)
  [facility] Progress Note   Assessment/ Plan:   Dialyzes at [facility] MWF 4 hrs EDW 74 kg 3K 2.5 Ca bath F180 dialyzer  Access: L thigh AVG healing, TDC in use now Mircera 100 q 2 weeks Calcitriol 1 mcg TIW Sensipar 120 mg TIW    1 Cholecystitis: for cholecystectomy Wednesday.  Per [facility]  2 ESRD: normally MWF, will convert to TTS schedule here due to surg schedule.  Not using systemic heparin in prep for surg.   3 Hypertension: no BP meds, on midodrine 10 mg BID 4. Anemia of ESRD: Last got Mircera 11/28, not due.  Last OP Hgb 10.8 5. Metabolic Bone Disease: on renvela 1 tab TID with meals, Calcitriol 1 mcg TIW and Sensipar 120 TIW.   6.  Nutrition: alb 3.8, may go down postop 7.  H/o TIA: holding Plavix for OR 8.  H/o AV endocarditis- s/p replacement. 9.  Dispo: pending resolution of worsening of biliary colic.    Subjective:    Abd pain improved slightly.  Tolerating HD well.     Objective:   BP (!) 127/54   Pulse (!) 52   Temp 98.6 F (37 C) (Oral)   Resp 17   Ht 5' (1.524 m)   Wt 73.6 kg (162 lb 4.1 oz)   SpO2 98%   BMI 31.69 kg/m   Physical Exam: GEN NAD, lying in bed HEENT EOMI PERRL MMM NECK no JVD today PULM clear bilaterally no c/w/r CV RRR no m/r/g crisp S2 ABD soft, + BS, RUQ tenderness remains EXT no LE edema NEURO AAO x 3 SKIN no rashes or lesions ACCESS: L thigh AVG + bruit, L TDC in place     Labs: BMET Recent Labs  Lab 01/28/17 0800 01/29/17 0436  NA 140 139  K 4.2 4.5  CL 103 101  CO2 24 23  GLUCOSE 102* 86  BUN 25* 32*  CREATININE 9.20* 10.60*  CALCIUM 8.8* 8.4*  PHOS  --  4.6   CBC Recent Labs  Lab 01/28/17 0800 01/29/17 0436  WBC 7.0 5.1  HGB 10.8* 10.2*  HCT 33.7* 31.9*  MCV 86.4 86.4  PLT 272 235    @IMGRELPRIORS @ Medications:    . [START ON 01/30/2017] cinacalcet  120 mg Oral Q M,W,F-HD  . famotidine  20 mg Oral Daily  . heparin  5,000 Units Subcutaneous Q8H  . sevelamer carbonate  800 mg Oral  TID WC  . sodium chloride flush  3 mL Intravenous Q12H     Madelon Lips, MD [facility] pgr [phone] 01/29/2017, 9:59 AM

## 2017-01-29 NOTE — Progress Notes (Signed)
Patient taken to dialysis. 

## 2017-01-29 NOTE — Consult Note (Signed)
Cardiology Consultation:   Patient ID: [name] Marks; [id number]; [birth year]   Admit date: 01/28/2017 Date of Consult: 01/29/2017  Primary Care Provider: Tonette Bihari, MD Primary Cardiologist: Dr. Radford Marks Primary Electrophysiologist:     Patient Profile:   [name] Marks is a 61 y.o. 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 Tammi Klippel.  History of Present Illness:   Ms. 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 Prior to surgery, she underwent heart cath on 02/24/16 that showed no evidence of CAD and normal PA pressure. She has had multiple strokes and ultimately had loop recorder implanted (10/01/16), which is so far negative for arrhythmias (last interrogation 11/30/16). She presented 05/17/16 with expressive aphasia and LUE numbness related to TIA. Repeat echo with normal EF, dilated RV and RV pressure overload, moderate TR and moderate pulmonary HTN. She is on plavix for antiplatelet therapy s/p stroke per neuro. She is not on anticoagulation.  She follows with Dr. Radford Marks and last saw her in clinic on 07/12/16. At that time, she was in her usual state of health. No medication changes.  Ms Marks presented to [facility] with development of RUQ abdominal pain. She has a history of cholelithiasis. She was evaluated for surgery and is scheduled for lap chole tomorrow. Cardiology asked to evaluate.   On my interview, pt denies chest pain, SOB, DOE, palpitation, dizziness, and syncope. She has had no new symptoms since her heart cath in 2107.   Past Medical History:  Diagnosis Date  . Anemia   . Arthritis    "knees" (01/03/2017)  . Carpal tunnel syndrome   . Complication of anesthesia    01/01/17- '"a long time ago, difficulty breathimg, not sure if it was due to anesthesia  or not."  . End stage renal disease on dialysis ([place])    "MWF; [place]." (01/03/2017)  . GERD (gastroesophageal reflux disease)   . Gout   . History of blood transfusion 2017   "related to blood poison" (01/03/2017)  . Osteoporosis 09/2016   T score -2.6  . Septic shock ([place])   . Staphylococcus aureus bacteremia   . TIA (transient ischemic attack)    "several" (01/03/2017)    Past Surgical History:  Procedure Laterality Date  . AORTIC VALVE REPLACEMENT N/A 02/25/2016   Procedure: AORTIC VALVE REPLACEMENT (AVR) implanted with Magna Ease Aortic valve size 50mm;  Surgeon: Melrose Nakayama, MD;  Location: [place];  Service: Open Heart Surgery;  Laterality: N/A;  . AV FISTULA PLACEMENT Left 01/03/2017   Procedure: INSERTION OF ARTERIOVENOUS (AV) GORE-TEX GRAFT LEFT THIGH;  Surgeon: Serafina Mitchell, MD;  Location: [place];  Service: Vascular;  Laterality: Left;  . [place] REMOVAL Right 02/17/2016   Procedure: REMOVAL OF TWO ARTERIOVENOUS GORETEX GRAFTS ([place]);  Surgeon: Angelia Mould, MD;  Location: [place];  Service: Vascular;  Laterality: Right;  . BREAST BIOPSY Left 2013   stereo   . BREAST BIOPSY Right 2011   stereo   . CARDIAC VALVE REPLACEMENT    . COLONOSCOPY W/ POLYPECTOMY    . DG AV DIALYSIS GRAFT DECLOT OR    . DILATATION & CURETTAGE/HYSTEROSCOPY WITH TRUECLEAR N/A 11/06/2012   Procedure: DILATATION & CURETTAGE/HYSTEROSCOPY WITH TRUECLEAR;  Surgeon: Terrance Mass, MD;  Location: [place] [facility];  Service:  Gynecology;  Laterality: N/A;  Truclear Resectoscopic Polypectomy   . INSERTION OF DIALYSIS CATHETER N/A 02/19/2016   Procedure: INSERTION OF Left Internal Jugular DIALYSIS CATHETER;  Surgeon: Angelia Mould, MD;  Location: [place];  Service: Vascular;  Laterality: N/A;  . LOOP RECORDER INSERTION N/A 10/01/2016   Procedure: LOOP RECORDER INSERTION;  Surgeon: Constance Haw, MD;  Location: [place] [facility];  Service: Cardiovascular;  Laterality: N/A;  . PATCH  ANGIOPLASTY Right 02/17/2016   Procedure: PATCH ANGIOPLASTY;  Surgeon: Angelia Mould, MD;  Location: [place];  Service: Vascular;  Laterality: Right;  . PERIPHERAL VASCULAR CATHETERIZATION N/A 09/14/2014   Procedure: A/V Shuntogram/Fistulagram;  Surgeon: Katha Cabal, MD;  Location: [place] [facility];  Service: Cardiovascular;  Laterality: N/A;  . PERIPHERAL VASCULAR CATHETERIZATION N/A 09/14/2014   Procedure: A/V Shunt Intervention;  Surgeon: Katha Cabal, MD;  Location: [place] [facility];  Service: Cardiovascular;  Laterality: N/A;  . PERIPHERAL VASCULAR CATHETERIZATION Right 12/09/2014   Procedure: A/V Shuntogram/Fistulagram;  Surgeon: Algernon Huxley, MD;  Location: [place] [facility];  Service: Cardiovascular;  Laterality: Right;  . PERIPHERAL VASCULAR CATHETERIZATION N/A 12/09/2014   Procedure: A/V Shunt Intervention;  Surgeon: Algernon Huxley, MD;  Location: [place] [facility];  Service: Cardiovascular;  Laterality: N/A;  . PERIPHERAL VASCULAR CATHETERIZATION Right 05/24/2015   Procedure: A/V Shuntogram;  Surgeon: Serafina Mitchell, MD;  Location: [place] [facility];  Service: Cardiovascular;  Laterality: Right;  . PERIPHERAL VASCULAR CATHETERIZATION Right 05/24/2015   Procedure: Peripheral Vascular Balloon Angioplasty;  Surgeon: Serafina Mitchell, MD;  Location: [place] [facility];  Service: Cardiovascular;  Laterality: Right;  right arm shunt  . PERIPHERAL VASCULAR CATHETERIZATION N/A 06/13/2015   Procedure: A/V Shuntogram/Fistulagram;  Surgeon: Algernon Huxley, MD;  Location: [place] [facility];  Service: Cardiovascular;  Laterality: N/A;  . PERIPHERAL VASCULAR CATHETERIZATION N/A 06/13/2015   Procedure: A/V Shunt Intervention;  Surgeon: Algernon Huxley, MD;  Location: [place] [facility];  Service: Cardiovascular;  Laterality: N/A;  . TEE WITHOUT CARDIOVERSION N/A 02/22/2016   Procedure: TRANSESOPHAGEAL ECHOCARDIOGRAM (TEE);  Surgeon: Pixie Casino, MD;  Location: [facility];   Service: Cardiovascular;  Laterality: N/A;  . TEE WITHOUT CARDIOVERSION N/A 02/25/2016   Procedure: TRANSESOPHAGEAL ECHOCARDIOGRAM (TEE);  Surgeon: Melrose Nakayama, MD;  Location: [place];  Service: Open Heart Surgery;  Laterality: N/A;  . TEE WITHOUT CARDIOVERSION N/A 10/01/2016   Procedure: TRANSESOPHAGEAL ECHOCARDIOGRAM (TEE);  Surgeon: Pixie Casino, MD;  Location: [place];  Service: Cardiovascular;  Laterality: N/A;  . TUBAL LIGATION  1983     Home Medications:  Prior to Admission medications   Medication Sig Start Date End Date Taking? Authorizing Provider  cinacalcet (SENSIPAR) 60 MG tablet Take 120 mg by mouth every Monday, Wednesday, and Friday with hemodialysis.    Yes [provider]  clopidogrel (PLAVIX) 75 MG tablet Take 1 tablet (75 mg total) by mouth daily. 01/23/17  Yes Mikell, Jeani Sow, MD  famotidine (PEPCID) 20 MG tablet Take 20 mg by mouth 2 (two) times daily. Lunch and dinner   Yes [provider]  fluticasone (FLONASE) 50 MCG/ACT nasal spray Place 2 sprays into both nostrils daily. 11/24/16  Yes Tereasa Coop, PA-C  ipratropium (ATROVENT) 0.06 % nasal spray Place 2 sprays into both nostrils 4 (four) times daily. Patient taking differently: Place 2 sprays into both nostrils 3 (three) times daily as needed for rhinitis.  11/24/16  Yes Clark,  Audrie Lia, PA-C  midodrine (PROAMATINE) 10 MG tablet Take 1 tablet (10 mg total) by mouth 2 (two) times daily as needed (30 min prior to HD). 08/27/16  Yes Mikell, Jeani Sow, MD  Multiple Vitamins-Minerals (PRORENAL QD PO) Take 1 tablet by mouth daily.    Yes [provider]  sevelamer (RENVELA) 800 MG tablet Take 800 mg by mouth 3 (three) times daily with meals.    Yes [provider]  oxyCODONE (ROXICODONE) 5 MG immediate release tablet Take 0.5 tablets (2.5 mg total) by mouth every 6 (six) hours as needed for severe pain. Patient not taking: Reported on 01/28/2017 10/28/16   Fatima Blank, MD  oxyCODONE (ROXICODONE) 5 MG immediate release tablet Take 0.5-1 tablets (2.5-5 mg total) every 4 (four) hours as needed by mouth for severe pain. Patient not taking: Reported on 01/28/2017 01/04/17   Ulyses Amor, PA-C    Inpatient Medications: Scheduled Meds: . [START ON 01/30/2017] cinacalcet  120 mg Oral Q M,W,F-HD  . famotidine  20 mg Oral Daily  . heparin  5,000 Units Subcutaneous Q8H  . sevelamer carbonate  800 mg Oral TID WC  . sodium chloride flush  3 mL Intravenous Q12H   Continuous Infusions: . sodium chloride    . sodium chloride    . sodium chloride     PRN Meds: sodium chloride, sodium chloride, sodium chloride, acetaminophen **OR** acetaminophen, alteplase, calcitRIOL, fentaNYL (SUBLIMAZE) injection, heparin, lidocaine (PF), lidocaine-prilocaine, midodrine, ondansetron, oxyCODONE-acetaminophen, pentafluoroprop-tetrafluoroeth, polyethylene glycol, sodium chloride flush  Allergies:    Allergies  Allergen Reactions  . Contrast Media [Iodinated Diagnostic Agents] Anaphylaxis  . Ancef [Cefazolin] Other (See Comments)    Pt states she can not tolerate  . Naproxen Sodium Itching    Social History:   Social History   Socioeconomic History  . Marital status: Divorced    Spouse name: Not on file  . Number of children: Not on file  . Years of education: Not on file  . Highest education level: Not on file  Social Needs  . Financial resource strain: Not on file  . Food insecurity - worry: Not on file  . Food insecurity - inability: Not on file  . Transportation needs - medical: Not on file  . Transportation needs - non-medical: Not on file  Occupational History  . Not on file  Tobacco Use  . Smoking status: Never Smoker  . Smokeless tobacco: Never Used  Substance and Sexual Activity  . Alcohol use: No  . Drug use: No  . Sexual activity: No    Birth control/protection: Post-menopausal    Comment: 1st intercourse 61 yo-Fewer than 5 partners    Other Topics Concern  . Not on file  Social History Narrative  . Not on file    Family History:    Family History  Problem Relation Age of Onset  . Diabetes Mother   . Hypertension Mother   . Heart disease Mother   . Alcohol abuse Mother   . Kidney disease Mother   . Cancer Father        COLON  . Alcohol abuse Father   . Breast cancer Sister 95  . Hypertension Sister   . Kidney disease Sister   . Hypertension Son   . Kidney disease Son      ROS:  Please see the history of present illness.  ROS  All other ROS reviewed and negative.     Physical Exam/Data:   Vitals:   01/29/17  0930 01/29/17 1000 01/29/17 1030 01/29/17 1100  BP: (!) 127/54 (!) 113/55 (!) 99/48 (!) 108/45  Pulse: (!) 52 68 72 66  Resp: 17 18 19 20   Temp:      TempSrc:      SpO2: 98%     Weight:      Height:        Intake/Output Summary (Last 24 hours) at 01/29/2017 1226 Last data filed at 01/29/2017 7989 Gross per 24 hour  Intake 60 ml  Output -  Net 60 ml   Filed Weights   01/28/17 0733 01/29/17 0800  Weight: 164 lb (74.4 kg) 162 lb 4.1 oz (73.6 kg)   Body mass index is 31.69 kg/m.  General:  Well nourished, well developed, in no acute distress HEENT: normal Neck: no JVD Vascular: No carotid bruits; FA pulses 2+ bilaterally without bruits  Cardiac:  normal S1, S2; RRR; no murmur Lungs:  clear to auscultation bilaterally, no wheezing, rhonchi or rales  Abd: soft, nontender, no hepatomegaly  Ext: no edema Musculoskeletal:  No deformities, BUE and BLE strength normal and equal Skin: warm and dry  Neuro:  CNs 2-12 intact, no focal abnormalities noted Psych:  Normal affect   EKG:  The EKG was personally reviewed and demonstrates:   Telemetry:  Telemetry was personally reviewed and demonstrates:    Relevant CV Studies:  Echo 09/30/16: Study Conclusions - Left ventricle: The cavity size was normal. Wall thickness was   normal. Systolic function was normal. The estimated ejection    fraction was in the range of 55% to 60%. Wall motion was normal;   there were no regional wall motion abnormalities. Left   ventricular diastolic function parameters were normal. - Ventricular septum: Septal motion showed paradox. The contour   showed diastolic flattening. These changes are consistent with RV   volume overload. - Aortic valve: A bioprosthesis was present and functioning   normally. Valve area (VTI): 2.39 cm^2. Valve area (Vmax): 2.01   cm^2. Valve area (Vmean): 2.19 cm^2. - Mitral valve: There was mild regurgitation. - Left atrium: The atrium was mildly dilated. - Right ventricle: The cavity size was moderately dilated. - Right atrium: The atrium was moderately dilated. - Atrial septum: The septum bowed from right to left, consistent   with increased right atrial pressure. No defect or patent foramen   ovale was identified. 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 A   diagnosis of severe regurgitation is supported by a dense   continuous wave Doppler signal, a concave configuration of the   late systolic continuous wave signal, and hepatic vein systolic   flow reversal. - Pulmonary arteries: Systolic pressure was mildly increased. PA   peak pressure: 43 mm Hg (S).   Heart cath 01/25/16: 1. No angiographic evidence of CAD 2. Normal PA pressure   Laboratory Data:  Chemistry Recent Labs  Lab 01/28/17 0800 01/29/17 0436  NA 140 139  K 4.2 4.5  CL 103 101  CO2 24 23  GLUCOSE 102* 86  BUN 25* 32*  CREATININE 9.20* 10.60*  CALCIUM 8.8* 8.4*  GFRNONAA 4* 3*  GFRAA 5* 4*  ANIONGAP 13 15    Recent Labs  Lab 01/28/17 0800 01/29/17 0436  PROT 7.2  --   ALBUMIN 3.8 3.2*  AST 26  --   ALT 15  --   ALKPHOS 277*  --   BILITOT 0.6  --    Hematology Recent  Labs  Lab 01/28/17 0800 01/29/17 0436  WBC 7.0 5.1  RBC 3.90 3.69*  HGB 10.8* 10.2*  HCT 33.7* 31.9*  MCV 86.4 86.4    MCH 27.7 27.6  MCHC 32.0 32.0  RDW 17.4* 17.5*  PLT 272 235   Cardiac EnzymesNo results for input(s): TROPONINI in the last 168 hours. No results for input(s): TROPIPOC in the last 168 hours.  BNPNo results for input(s): BNP, PROBNP in the last 168 hours.  DDimer No results for input(s): DDIMER in the last 168 hours.  Radiology/Studies:  US Abdomen Limited Ruq  Result Date: 01/28/2017 CLINICAL DATA:  Right upper quadrant pain EXAM: ULTRASOUND ABDOMEN LIMITED RIGHT UPPER QUADRANT COMPARISON:  10/28/2016 FINDINGS: Gallbladder: 2.2 cm irregular gallstone is present in the gallbladder. Mild gallbladder wall thickening. Positive sonographic Murphy sign. No pericholecystic fluid. Common bile duct: Diameter: 5 mm Liver: The liver is diffusely increased in echogenicity. There is no obvious nodularity. No focal mass. The portal vein is patent with bidirectional flow. Flow was both hepatopedal and hepatofugal. IMPRESSION: Cholelithiasis There is gallbladder wall thickening and positive sonographic Murphy sign. Acute cholecystitis is not excluded. Correlate clinically as for the need for nuclear medicine imaging to assess for cystic duct patency. Diffusely increased echogenicity in the liver. Differential diagnosis includes diffuse hepatic steatosis and diffuse hepatic parenchymal disease There is bidirectional flow in the portal vein. Reversal of flow is seen with portal hypertension. Electronically Signed   By: Marybelle Killings M.D.   On: 01/28/2017 12:24    Assessment and Plan:   1. Preoperative cardiac risk evaluation Pt had recent evaluation with left heart catheterization revealing no obstructive CAD. It is unlikely that disease would have progressed in under 1 year. She is s/p AVR without complications and is not on anticoagulation for this. Cholecystectomy is a lower risk surgery when performed laparoscopically and confers higher risk if converted to an open procedure. From a cardiac standpoint, she is  not at any higher risk for events than the general population with her medical history. Restart plavix for stroke per surgical team.    For questions or updates, please contact [place] Please consult [email or web address] for contact info under Cardiology/STEMI.   Signed, Tami Lin Duke, PA  01/29/2017 12:26 PM As above, patient seen and examined.  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  Note cardiac catheterization prior to aortic valve replacement in December 2017 showed no coronary disease and she had normal pulmonary pressures.  Last echocardiogram August 2018 showed normal LV function, bioprosthetic aortic valve with mean gradient 7 mmHg, mild mitral regurgitation, moderate right atrial and right ventricular enlargement, severe tricuspid regurgitation.  Patient denies dyspnea on exertion, orthopnea, PND, pedal edema, chest pain or syncope.  She has developed right upper quadrant pain and needs cholecystectomy and we were asked to evaluate preoperatively.  Physical exam shows 2/6 systolic murmur left sternal border.  No diastolic murmur.  Electrocardiogram is pending and ordered.  1 preoperative evaluation prior to cholecystectomy-preoperative cardiac catheterization prior to aortic valve replacement 1 year ago showed no coronary disease.  Last echocardiogram showed normal LV function and normal gradient across aortic valve.  She denies dyspnea, chest pain or syncope.  Will await electrocardiogram.  If no change from previous she may proceed without further cardiac evaluation.  2 end-stage renal disease-management per nephrology.  3 cholecystitis-management per general surgery.  4 prior aortic valve replacement  5 prior  CVA-resume Plavix following surgery when hemostasis achieved.  Please call with questions.  Kirk Ruths, MD

## 2017-01-29 NOTE — Progress Notes (Signed)
[facility] Progress Note     Subjective: CC:  Currently in HD. Reports RUQ pain is improved compared to yesterday. Denies nausea/vomiting.   Objective: Vital signs in last 24 hours: Temp:  [97.9 F (36.6 C)-98.6 F (37 C)] 98.6 F (37 C) (12/04 0800) Pulse Rate:  [52-88] 52 (12/04 0930) Resp:  [10-23] 17 (12/04 0930) BP: (100-152)/(53-90) 127/54 (12/04 0930) SpO2:  [90 %-100 %] 98 % (12/04 0930) Weight:  [73.6 kg (162 lb 4.1 oz)] 73.6 kg (162 lb 4.1 oz) (12/04 0800) Last BM Date: 01/26/17  Intake/Output from previous day: 12/03 0701 - 12/04 0700 In: 60 [P.O.:60] Out: -  Intake/Output this shift: No intake/output data recorded.  PE: Gen:  Alert, NAD, pleasant HEENT: pupils equal and round, EOMs in tact, anicteric sclerae  Card:  Regular rate and rhythm Pulm:  Normal effort, clear to auscultation bilaterally Abd: Soft, TTP RUQ without peritonitis, non-distended, bowel sounds present in all 4 quadrants Skin: warm and dry, no rashes  Psych: A&Ox3   Lab Results:  Recent Labs    01/28/17 0800 01/29/17 0436  WBC 7.0 5.1  HGB 10.8* 10.2*  HCT 33.7* 31.9*  PLT 272 235   BMET Recent Labs    01/28/17 0800 01/29/17 0436  NA 140 139  K 4.2 4.5  CL 103 101  CO2 24 23  GLUCOSE 102* 86  BUN 25* 32*  CREATININE 9.20* 10.60*  CALCIUM 8.8* 8.4*   PT/INR No results for input(s): LABPROT, INR in the last 72 hours. CMP     Component Value Date/Time   NA 139 01/29/2017 0436   NA 139 12/17/2012   NA 138 07/04/2011 1058   K 4.5 01/29/2017 0436   K 4.9 05/19/2012 0937   CL 101 01/29/2017 0436   CL 103 07/04/2011 1058   CO2 23 01/29/2017 0436   CO2 29 07/04/2011 1058   GLUCOSE 86 01/29/2017 0436   GLUCOSE 91 07/04/2011 1058   BUN 32 (H) 01/29/2017 0436   BUN 34 (A) 12/17/2012   BUN 18 07/04/2011 1058   CREATININE 10.60 (H) 01/29/2017 0436   CREATININE 5.85 (H) 10/28/2014 0918   CALCIUM 8.4 (L) 01/29/2017 0436   CALCIUM 10.2 (H) 07/04/2011 1058   PROT 7.2 01/28/2017 0800   ALBUMIN 3.2 (L) 01/29/2017 0436   AST 26 01/28/2017 0800   ALT 15 01/28/2017 0800   ALKPHOS 277 (H) 01/28/2017 0800   BILITOT 0.6 01/28/2017 0800   GFRNONAA 3 (L) 01/29/2017 0436   GFRNONAA 7 (L) 07/04/2011 1058   GFRAA 4 (L) 01/29/2017 0436   GFRAA 8 (L) 07/04/2011 1058   Lipase     Component Value Date/Time   LIPASE 32 01/28/2017 0800       Studies/Results: US Abdomen Limited Ruq  Result Date: 01/28/2017 CLINICAL DATA:  Right upper quadrant pain EXAM: ULTRASOUND ABDOMEN LIMITED RIGHT UPPER QUADRANT COMPARISON:  10/28/2016 FINDINGS: Gallbladder: 2.2 cm irregular gallstone is present in the gallbladder. Mild gallbladder wall thickening. Positive sonographic Murphy sign. No pericholecystic fluid. Common bile duct: Diameter: 5 mm Liver: The liver is diffusely increased in echogenicity. There is no obvious nodularity. No focal mass. The portal vein is patent with bidirectional flow. Flow was both hepatopedal and hepatofugal. IMPRESSION: Cholelithiasis There is gallbladder wall thickening and positive sonographic Murphy sign. Acute cholecystitis is not excluded. Correlate clinically as for the need for nuclear medicine imaging to assess for cystic duct patency. Diffusely increased echogenicity in the liver. Differential diagnosis includes diffuse hepatic steatosis and  diffuse hepatic parenchymal disease There is bidirectional flow in the portal vein. Reversal of flow is seen with portal hypertension. Electronically Signed   By: Marybelle Killings M.D.   On: 01/28/2017 12:24    Anti-infectives: Anti-infectives (From admission, onward)   None     Assessment/Plan ESRD on HD - Renal following. HD today. Hx endocarditis and aorticbovine valve replacement GERD Hx TIA   Symptomatic cholelithiasis vs chronic cholecystitis  - afebrile, VSS, no leukocytosis - worsening biliary colic x 1 week with associated poor oral intake.  - RUQ U/S 12/3 with mild GB wall thickening,  2.2 cm stone, positive sonographic Murphy's.  - Plavix held and I have contacted cardiology to consult for pre-operative cardiac clearance.  - plan for lap chole tomorrow vs Thursday, will confirm timing of surgery with MD.   FEN: Renal, NPO after MN ID: perioperative Ancef VTE: SCD's, SQ heparin   LOS: 1 day    Jill Alexanders , [facility] 01/29/2017, 10:54 AM Pager: [phone] Consults: [phone] Mon-Fri 7:00 am-4:30 pm Sat-Sun 7:00 am-11:30 am

## 2017-01-30 ENCOUNTER — Encounter (HOSPITAL_COMMUNITY): Admission: EM | Disposition: A | Discharge status: Home Health | Payer: Self-pay | Source: Home / Self Care

## 2017-01-30 ENCOUNTER — Inpatient Hospital Stay (HOSPITAL_COMMUNITY): Payer: Medicare Other | Admitting: Anesthesiology

## 2017-01-30 ENCOUNTER — Inpatient Hospital Stay (HOSPITAL_COMMUNITY): Payer: Medicare Other

## 2017-01-30 ENCOUNTER — Encounter (HOSPITAL_COMMUNITY): Payer: Self-pay | Admitting: *Deleted

## 2017-01-30 DIAGNOSIS — I361 Nonrheumatic tricuspid (valve) insufficiency: Secondary | ICD-10-CM

## 2017-01-30 HISTORY — PX: CHOLECYSTECTOMY: SHX55

## 2017-01-30 LAB — RENAL FUNCTION PANEL
ALBUMIN: 3.2 g/dL — AB (ref 3.5–5.0)
Anion gap: 14 (ref 5–15)
BUN: 13 mg/dL (ref 6–20)
CHLORIDE: 96 mmol/L — AB (ref 101–111)
CO2: 26 mmol/L (ref 22–32)
Calcium: 8.8 mg/dL — ABNORMAL LOW (ref 8.9–10.3)
Creatinine, Ser: 5.86 mg/dL — ABNORMAL HIGH (ref 0.44–1.00)
GFR calc Af Amer: 8 mL/min — ABNORMAL LOW (ref >60)
GFR calc non Af Amer: 7 mL/min — ABNORMAL LOW (ref >60)
GLUCOSE: 96 mg/dL (ref 65–99)
POTASSIUM: 4.1 mmol/L (ref 3.5–5.1)
Phosphorus: 3.6 mg/dL (ref 2.5–4.6)
Sodium: 136 mmol/L (ref 135–145)

## 2017-01-30 LAB — ECHOCARDIOGRAM COMPLETE
HEIGHTINCHES: 60 in
WEIGHTICAEL: 2528 [oz_av]

## 2017-01-30 SURGERY — LAPAROSCOPIC CHOLECYSTECTOMY
Anesthesia: General | Site: Abdomen

## 2017-01-30 MED ORDER — SUGAMMADEX SODIUM 200 MG/2ML IV SOLN
INTRAVENOUS | Status: AC
  Filled 2017-01-30: qty 2

## 2017-01-30 MED ORDER — PHENYLEPHRINE 40 MCG/ML (10ML) SYRINGE FOR IV PUSH (FOR BLOOD PRESSURE SUPPORT)
PREFILLED_SYRINGE | INTRAVENOUS | Status: DC | PRN
  Administered 2017-01-30: 120 ug via INTRAVENOUS
  Administered 2017-01-30: 80 ug via INTRAVENOUS
  Administered 2017-01-30: 120 ug via INTRAVENOUS
  Administered 2017-01-30: 80 ug via INTRAVENOUS

## 2017-01-30 MED ORDER — FENTANYL CITRATE (PF) 250 MCG/5ML IJ SOLN
INTRAMUSCULAR | Status: AC
  Filled 2017-01-30: qty 5

## 2017-01-30 MED ORDER — OXYCODONE HCL 5 MG PO TABS: 5.0000 mg | ORAL_TABLET | Freq: Once | ORAL | Status: DC | PRN

## 2017-01-30 MED ORDER — ROCURONIUM BROMIDE 10 MG/ML (PF) SYRINGE
PREFILLED_SYRINGE | INTRAVENOUS | Status: DC | PRN
  Administered 2017-01-30: 10 mg via INTRAVENOUS
  Administered 2017-01-30: 25 mg via INTRAVENOUS
  Administered 2017-01-30: 5 mg via INTRAVENOUS

## 2017-01-30 MED ORDER — SUGAMMADEX SODIUM 200 MG/2ML IV SOLN
INTRAVENOUS | Status: DC | PRN
  Administered 2017-01-30: 200 mg via INTRAVENOUS

## 2017-01-30 MED ORDER — ONDANSETRON HCL 4 MG/2ML IJ SOLN
INTRAMUSCULAR | Status: DC | PRN
  Administered 2017-01-30: 4 mg via INTRAVENOUS

## 2017-01-30 MED ORDER — PHENYLEPHRINE 40 MCG/ML (10ML) SYRINGE FOR IV PUSH (FOR BLOOD PRESSURE SUPPORT)
PREFILLED_SYRINGE | INTRAVENOUS | Status: AC
  Filled 2017-01-30: qty 10

## 2017-01-30 MED ORDER — BUPIVACAINE-EPINEPHRINE (PF) 0.25% -1:200000 IJ SOLN
INTRAMUSCULAR | Status: AC
  Filled 2017-01-30: qty 30

## 2017-01-30 MED ORDER — MIDAZOLAM HCL 2 MG/2ML IJ SOLN
INTRAMUSCULAR | Status: AC
  Filled 2017-01-30: qty 2

## 2017-01-30 MED ORDER — PROMETHAZINE HCL 25 MG/ML IJ SOLN: 6.2500 mg | INTRAMUSCULAR | Status: DC | PRN

## 2017-01-30 MED ORDER — FENTANYL CITRATE (PF) 100 MCG/2ML IJ SOLN
INTRAMUSCULAR | Status: DC | PRN
  Administered 2017-01-30 (×2): 100 ug via INTRAVENOUS
  Administered 2017-01-30: 50 ug via INTRAVENOUS

## 2017-01-30 MED ORDER — OXYCODONE-ACETAMINOPHEN 5-325 MG PO TABS
1.0000 | ORAL_TABLET | ORAL | Status: AC | PRN
  Administered 2017-01-31 (×2): 1 via ORAL
  Filled 2017-01-30 (×2): qty 1

## 2017-01-30 MED ORDER — DEXAMETHASONE SODIUM PHOSPHATE 10 MG/ML IJ SOLN
INTRAMUSCULAR | Status: DC | PRN
Start: 2017-01-30 — End: 2017-01-30
  Administered 2017-01-30: 10 mg via INTRAVENOUS

## 2017-01-30 MED ORDER — SODIUM CHLORIDE 0.9 % IV SOLN
INTRAVENOUS | Status: DC
  Administered 2017-01-30: 10:00:00 via INTRAVENOUS

## 2017-01-30 MED ORDER — DEXAMETHASONE SODIUM PHOSPHATE 10 MG/ML IJ SOLN
INTRAMUSCULAR | Status: AC
  Filled 2017-01-30: qty 1

## 2017-01-30 MED ORDER — PROPOFOL 10 MG/ML IV BOLUS
INTRAVENOUS | Status: DC | PRN
  Administered 2017-01-30: 100 mg via INTRAVENOUS
  Administered 2017-01-30: 50 mg via INTRAVENOUS

## 2017-01-30 MED ORDER — BUPIVACAINE-EPINEPHRINE 0.25% -1:200000 IJ SOLN
INTRAMUSCULAR | Status: DC | PRN
  Administered 2017-01-30: 7 mL

## 2017-01-30 MED ORDER — SODIUM CHLORIDE 0.9 % IR SOLN
Status: DC | PRN
  Administered 2017-01-30: 1000 mL

## 2017-01-30 MED ORDER — LIDOCAINE 2% (20 MG/ML) 5 ML SYRINGE
INTRAMUSCULAR | Status: DC | PRN
  Administered 2017-01-30: 60 mg via INTRAVENOUS

## 2017-01-30 MED ORDER — OXYCODONE HCL 5 MG/5ML PO SOLN: 5.0000 mg | Freq: Once | ORAL | Status: DC | PRN

## 2017-01-30 MED ORDER — ONDANSETRON HCL 4 MG/2ML IJ SOLN
INTRAMUSCULAR | Status: AC
  Filled 2017-01-30: qty 2

## 2017-01-30 MED ORDER — MIDAZOLAM HCL 5 MG/5ML IJ SOLN
INTRAMUSCULAR | Status: DC | PRN
  Administered 2017-01-30: 2 mg via INTRAVENOUS

## 2017-01-30 MED ORDER — HYDROMORPHONE HCL 1 MG/ML IJ SOLN: 0.2500 mg | INTRAMUSCULAR | Status: DC | PRN

## 2017-01-30 MED ORDER — EPHEDRINE SULFATE-NACL 50-0.9 MG/10ML-% IV SOSY
PREFILLED_SYRINGE | INTRAVENOUS | Status: DC | PRN
  Administered 2017-01-30: 10 mg via INTRAVENOUS

## 2017-01-30 MED ORDER — EPHEDRINE 5 MG/ML INJ
INTRAVENOUS | Status: AC
  Filled 2017-01-30: qty 10

## 2017-01-30 MED ORDER — PROPOFOL 10 MG/ML IV BOLUS
INTRAVENOUS | Status: AC
  Filled 2017-01-30: qty 20

## 2017-01-30 MED ORDER — LIDOCAINE 2% (20 MG/ML) 5 ML SYRINGE
INTRAMUSCULAR | Status: AC
  Filled 2017-01-30: qty 5

## 2017-01-30 MED ORDER — 0.9 % SODIUM CHLORIDE (POUR BTL) OPTIME
TOPICAL | Status: DC | PRN
  Administered 2017-01-30: 1000 mL

## 2017-01-30 MED ORDER — HEMOSTATIC AGENTS (NO CHARGE) OPTIME
TOPICAL | Status: DC | PRN
  Administered 2017-01-30: 1 via TOPICAL

## 2017-01-30 MED ORDER — IOPAMIDOL (ISOVUE-300) INJECTION 61%
INTRAVENOUS | Status: AC
  Filled 2017-01-30: qty 50

## 2017-01-30 SURGICAL SUPPLY — 39 items
APPLIER CLIP ROT 10 11.4 M/L (STAPLE) ×3
BLADE CLIPPER SURG (BLADE) IMPLANT
CANISTER SUCT 3000ML PPV (MISCELLANEOUS) ×3 IMPLANT
CHLORAPREP W/TINT 26ML (MISCELLANEOUS) ×3 IMPLANT
CLIP APPLIE ROT 10 11.4 M/L (STAPLE) ×2 IMPLANT
COVER MAYO STAND STRL (DRAPES) IMPLANT
COVER SURGICAL LIGHT HANDLE (MISCELLANEOUS) ×3 IMPLANT
DERMABOND ADVANCED (GAUZE/BANDAGES/DRESSINGS) ×1
DERMABOND ADVANCED .7 DNX12 (GAUZE/BANDAGES/DRESSINGS) ×2 IMPLANT
DRAPE C-ARM 42X72 X-RAY (DRAPES) IMPLANT
DRAPE WARM FLUID 44X44 (DRAPE) ×3 IMPLANT
ELECT REM PT RETURN 9FT ADLT (ELECTROSURGICAL) ×3
ELECTRODE REM PT RTRN 9FT ADLT (ELECTROSURGICAL) ×2 IMPLANT
GLOVE BIO SURGEON STRL SZ8 (GLOVE) ×3 IMPLANT
GLOVE BIOGEL PI IND STRL 8 (GLOVE) ×2 IMPLANT
GLOVE BIOGEL PI INDICATOR 8 (GLOVE) ×1
GOWN STRL REUS W/ TWL LRG LVL3 (GOWN DISPOSABLE) ×4 IMPLANT
GOWN STRL REUS W/ TWL XL LVL3 (GOWN DISPOSABLE) ×2 IMPLANT
GOWN STRL REUS W/TWL LRG LVL3 (GOWN DISPOSABLE) ×2
GOWN STRL REUS W/TWL XL LVL3 (GOWN DISPOSABLE) ×1
HEMOSTAT SNOW SURGICEL 2X4 (HEMOSTASIS) ×3 IMPLANT
KIT BASIN OR (CUSTOM PROCEDURE TRAY) ×3 IMPLANT
KIT ROOM TURNOVER OR (KITS) ×3 IMPLANT
NS IRRIG 1000ML POUR BTL (IV SOLUTION) ×3 IMPLANT
PAD ARMBOARD 7.5X6 YLW CONV (MISCELLANEOUS) ×9 IMPLANT
POUCH SPECIMEN RETRIEVAL 10MM (ENDOMECHANICALS) ×3 IMPLANT
SCISSORS LAP 5X35 DISP (ENDOMECHANICALS) ×3 IMPLANT
SET CHOLANGIOGRAPH 5 50 .035 (SET/KITS/TRAYS/PACK) IMPLANT
SET IRRIG TUBING LAPAROSCOPIC (IRRIGATION / IRRIGATOR) ×3 IMPLANT
SLEEVE ENDOPATH XCEL 5M (ENDOMECHANICALS) ×3 IMPLANT
SPECIMEN JAR SMALL (MISCELLANEOUS) ×3 IMPLANT
SUT MNCRL AB 4-0 PS2 18 (SUTURE) ×3 IMPLANT
TOWEL OR 17X24 6PK STRL BLUE (TOWEL DISPOSABLE) ×3 IMPLANT
TOWEL OR 17X26 10 PK STRL BLUE (TOWEL DISPOSABLE) ×3 IMPLANT
TRAY LAPAROSCOPIC MC (CUSTOM PROCEDURE TRAY) ×3 IMPLANT
TROCAR XCEL BLUNT TIP 100MML (ENDOMECHANICALS) ×3 IMPLANT
TROCAR XCEL NON-BLD 11X100MML (ENDOMECHANICALS) ×3 IMPLANT
TROCAR XCEL NON-BLD 5MMX100MML (ENDOMECHANICALS) ×3 IMPLANT
TUBING INSUFFLATION (TUBING) ×3 IMPLANT

## 2017-01-30 NOTE — Progress Notes (Signed)
  Echocardiogram 2D Echocardiogram has been performed.  Ninamarie Keel T Rayni Nemitz 01/30/2017, 4:18 PM

## 2017-01-30 NOTE — Anesthesia Postprocedure Evaluation (Signed)
Anesthesia Post Note  Patient: [name] Marks  Procedure(s) Performed: LAPAROSCOPIC CHOLECYSTECTOMY (N/A Abdomen)     Patient location during evaluation: PACU Anesthesia Type: General Level of consciousness: awake and alert Pain management: pain level controlled Vital Signs Assessment: post-procedure vital signs reviewed and stable Respiratory status: spontaneous breathing, nonlabored ventilation, respiratory function stable and patient connected to nasal cannula oxygen Cardiovascular status: blood pressure returned to baseline and stable Postop Assessment: no apparent nausea or vomiting Anesthetic complications: no    Last Vitals:  Vitals:   01/30/17 1159 01/30/17 1236  BP:  (!) 101/58  Pulse: 73 75  Resp: 16 18  Temp: (!) 36.4 C   SpO2: 100% 100%    Last Pain:  Vitals:   01/30/17 1428  TempSrc:   PainSc: 8                  Paitlyn Mcclatchey P Gerardo Territo

## 2017-01-30 NOTE — Progress Notes (Signed)
Patient going to OR for lap chole, pre-op checklist complete, report called to short-stay. Patient notified family.

## 2017-01-30 NOTE — Transfer of Care (Signed)
Immediate Anesthesia Transfer of Care Note  Patient: [name] Marks  Procedure(s) Performed: LAPAROSCOPIC CHOLECYSTECTOMY (N/A Abdomen)  Patient Location: PACU  Anesthesia Type:General  Level of Consciousness: awake, oriented and patient cooperative  Airway & Oxygen Therapy: Patient Spontanous Breathing and Patient connected to nasal cannula oxygen  Post-op Assessment: Report given to RN, Post -op Vital signs reviewed and stable and Patient moving all extremities  Post vital signs: Reviewed and stable  Last Vitals:  Vitals:   01/29/17 2034 01/30/17 0500  BP: 105/61 (!) 108/36  Pulse: 65 66  Resp: 18 18  Temp: 37 C 36.8 C  SpO2: 100% 100%    Last Pain:  Vitals:   01/30/17 0500  TempSrc: Oral  PainSc:          Complications: No apparent anesthesia complications

## 2017-01-30 NOTE — Op Note (Signed)
Laparoscopic Cholecystectomy   Indications: This patient presents with symptomatic gallbladder disease and will undergo laparoscopic cholecystectomy. The procedure has been discussed with the patient. Operative and non operative treatments have been discussed. Risks of surgery include bleeding, infection,  Common bile duct injury,  Injury to the stomach,liver, colon,small intestine, abdominal wall,  Diaphragm,  Major blood vessels,  And the need for an open procedure.  Other risks include worsening of medical problems, death,  DVT and pulmonary embolism, and cardiovascular events.   Medical options have also been discussed. The patient has been informed of long term expectations of surgery and non surgical options,  The patient agrees to proceed.    Pre-operative Diagnosis: Calculus of gallbladder without mention of cholecystitis or obstruction  Post-operative Diagnosis: Same  Surgeon: Joyice Faster Sarabella Caprio   Assistants: Hitchcock RNFA    Anesthesia: General endotracheal anesthesia and Local anesthesia 0.25.% bupivacaine, with epinephrine  ASA Class: 3  Procedure Details  The patient was seen again in the Holding Room. The risks, benefits, complications, treatment options, and expected outcomes were discussed with the patient. The possibilities of reaction to medication, pulmonary aspiration, perforation of viscus, bleeding, recurrent infection, finding a normal gallbladder, the need for additional procedures, failure to diagnose a condition, the possible need to convert to an open procedure, and creating a complication requiring transfusion or operation were discussed with the patient. The patient and/or family concurred with the proposed plan, giving informed consent. The site of surgery properly noted/marked. The patient was taken to Operating Room, identified as [name] Marks and the procedure verified as Laparoscopic Cholecystectomy with Intraoperative Cholangiograms. A Time Out was held and the  above information confirmed.  Prior to the induction of general anesthesia, antibiotic prophylaxis was administered. General endotracheal anesthesia was then administered and tolerated well. After the induction, the abdomen was prepped in the usual sterile fashion. The patient was positioned in the supine position with the left arm comfortably tucked, along with some reverse Trendelenburg.  Local anesthetic agent was injected into the skin near the umbilicus and an incision made. The midline fascia was incised and the Hasson technique was used to introduce a 12 mm port under direct vision. It was secured with a figure of eight Vicryl suture placed in the usual fashion. Pneumoperitoneum was then created with CO2 and tolerated well without any adverse changes in the patient's vital signs. Additional trocars were introduced under direct vision with an 11 mm trocar in the epigastrium and two  5 mm trocars in the right upper quadrant. All skin incisions were infiltrated with a local anesthetic agent before making the incision and placing the trocars.   The gallbladder was identified, the fundus grasped and retracted cephalad. Adhesions were lysed bluntly and with the electrocautery where indicated, taking care not to injure any adjacent organs or viscus. The infundibulum was grasped and retracted laterally, exposing the peritoneum overlying the triangle of Calot. This was then divided and exposed in a blunt fashion. The cystic duct was clearly identified and bluntly dissected circumferentially. The junctions of the gallbladder, cystic duct and common bile duct were clearly identified prior to the division of any linear structure.    The cystic duct was very small and friable.  I obtained the critical view and opted to forgo cholangiogram.  The cystic duct was then  ligated with surgical clips  on the patient side and  clipped on the gallbladder side and divided. The cystic artery was identified, dissected  free, ligated with clips and  divided as well. Posterior cystic artery clipped and divided.  The gallbladder was dissected from the liver bed in retrograde fashion with the electrocautery. The gallbladder was removed. The liver bed was irrigated and inspected. Hemostasis was achieved with the electrocautery. Copious irrigation was utilized and was repeatedly aspirated until clear all particulate matter. Hemostasis was achieved with cautery and Surgicel snow with  no signs of bleeding or bile leakage.  Pneumoperitoneum was completely reduced after viewing removal of the trocars under direct vision. The wound was thoroughly irrigated and the fascia was then closed with a figure of eight suture; the skin was then closed with 4 O monocryl  and a sterile dressing was applied.  Instrument, sponge, and needle counts were correct at closure and at the conclusion of the case.   Findings: Cholecystitis with Cholelithiasis  Estimated Blood Loss: less than 50 mL                  Total IV Fluids: per anesthesia record          Specimens: Gallbladder           Complications: None; patient tolerated the procedure well.         Disposition: PACU - hemodynamically stable.         Condition: stable

## 2017-01-30 NOTE — Interval H&P Note (Signed)
History and Physical Interval Note:  01/30/2017 9:37 AM  [name] Marks  has presented today for surgery, with the diagnosis of Chronic cholecystitis  The various methods of treatment have been discussed with the patient and family. After consideration of risks, benefits and other options for treatment, the patient has consented to  Procedure(s): LAPAROSCOPIC CHOLECYSTECTOMY WITH  POSSIBLE INTRAOPERATIVE CHOLANGIOGRAM (N/A) as a surgical intervention .  The patient's history has been reviewed, patient examined, no change in status, stable for surgery.  I have reviewed the patient's chart and labs.  Questions were answered to the patient's satisfaction.  The procedure has been discussed with the patient. Operative and non operative treatments have been discussed. Risks of surgery include bleeding, infection,  Common bile duct injury,  Injury to the stomach,liver, colon,small intestine, abdominal wall,  Diaphragm,  Major blood vessels,  And the need for an open procedure.  Other risks include worsening of medical problems, death,  DVT and pulmonary embolism, and cardiovascular events.   Medical options have also been discussed. The patient has been informed of long term expectations of surgery and non surgical options,  The patient agrees to proceed.      [place]

## 2017-01-30 NOTE — Progress Notes (Signed)
Family Medicine Teaching Service Daily Progress Note Intern Pager: [phone]  Patient name: [name] Marks Medical record number: [id number] Date of birth: [birth year] Age: 61 y.o. Gender: female  Primary Care Provider: Tonette Bihari, MD Consultants: general surgery  Code Status: full   Pt Overview and Major Events to Date:  Admitted to [facility] on 01/28/17  Assessment and Plan: [name] Marks a 61 y.o.femalepresenting with RUQ pain. PMH is significant forESRD on MWF schedule,endocarditis s/p aortic valve replacement, and cholelithiasis.   Cholelithiasis Patient with history of cholelithiasis with established follow up with outpatient surgery. Patient was last seen by Dr. Kieth Brightly in October 2018 for symptomatic cholelithiasis at which time symptoms were controlled by dietary modifications. Patient has been afebrile with no leukocytosis.Patient was seen by surgery while in ED, plan for possible surgery on 01/30/17. Patient notes improvement in pain since admission.  -hold plavix for surgery -holding heparin prior to surgery  -tylenol prn  -K pad prn  -percocet 1 tablet q4hrsas needed -79mcg fentanyl as needed for breakthrough pain -surgery consulted, plan for surgery today. Appreciate recommendations.  -zofran prn -cardiology consulted for cardiac clearance   ESRD, MWF HD Patient with history of MWF dialysis. Patient missed HD today, and has had shortened HD sessions recently due to increased pain. Currently being dialyzed through left chest vascular catheter with recent left thigh fistula placement. Per nephrology, will be coverted to TTS schedule while inpatient. Patient received HD today.  -nephrology consulted, appreciate recommendation  Hx of aortic valve replacement Patient with history of aortic valve replacement in 2017 2/2 to endocarditis. Currently on plavix for anticoagulation. Unclear why on anticoagulation, possible stent vs. Clotting of HD fistula.   -will hold plavix pending surgery  -heparin for DVT prophylaxis, will discontinue 12 hrs prior to surgery  GERD Home medications: pepcid 20mg  bid -continue home medications  FEN/GI: NPO PPx: holding heparin prior to surgery   Disposition: continued inpatient stay   Subjective:  Patient today in good spirits. Patient denies abdominal pain. Denies nausea, vomiting, diarrhea. Anxiously awaiting surgery   Objective: Temp:  [97.5 F (36.4 C)-98.6 F (37 C)] 97.5 F (36.4 C) (12/05 1159) Pulse Rate:  [65-86] 73 (12/05 1159) Resp:  [11-18] 16 (12/05 1159) BP: (98-119)/(36-61) 104/58 (12/05 1151) SpO2:  [100 %] 100 % (12/05 1159) Weight:  [158 lb (71.7 kg)] 158 lb (71.7 kg) (12/05 0933) Physical Exam: General: awake and alert, laying in bed watching television  Cardiovascular: RRR, no MRG Respiratory: CTAB, no wheezes, rales, or rhonchi  Abdomen: soft, tenderness in RUQ, +Murphy's sign, bowel sounds normal  Extremities: no edema, non tender   Laboratory: Recent Labs  Lab 01/28/17 0800 01/29/17 0436  WBC 7.0 5.1  HGB 10.8* 10.2*  HCT 33.7* 31.9*  PLT 272 235   Recent Labs  Lab 01/28/17 0800 01/29/17 0436 01/30/17 0625  NA 140 139 136  K 4.2 4.5 4.1  CL 103 101 96*  CO2 24 23 26   BUN 25* 32* 13  CREATININE 9.20* 10.60* 5.86*  CALCIUM 8.8* 8.4* 8.8*  PROT 7.2  --   --   BILITOT 0.6  --   --   ALKPHOS 277*  --   --   ALT 15  --   --   AST 26  --   --   GLUCOSE 102* 86 96    Imaging/Diagnostic Tests: US Abdomen Limited Ruq  Result Date: 01/28/2017 CLINICAL DATA:  Right upper quadrant pain EXAM: ULTRASOUND ABDOMEN LIMITED RIGHT UPPER QUADRANT COMPARISON:  10/28/2016 FINDINGS: Gallbladder: 2.2 cm irregular gallstone is present in the gallbladder. Mild gallbladder wall thickening. Positive sonographic Murphy sign. No pericholecystic fluid. Common bile duct: Diameter: 5 mm Liver: The liver is diffusely increased in echogenicity. There is no obvious nodularity. No  focal mass. The portal vein is patent with bidirectional flow. Flow was both hepatopedal and hepatofugal. IMPRESSION: Cholelithiasis There is gallbladder wall thickening and positive sonographic Murphy sign. Acute cholecystitis is not excluded. Correlate clinically as for the need for nuclear medicine imaging to assess for cystic duct patency. Diffusely increased echogenicity in the liver. Differential diagnosis includes diffuse hepatic steatosis and diffuse hepatic parenchymal disease There is bidirectional flow in the portal vein. Reversal of flow is seen with portal hypertension. Electronically Signed   By: Marybelle Killings M.D.   On: 01/28/2017 12:24     Caroline More, DO 01/30/2017, 12:30 PM PGY-1, [place] Intern pager: [phone], text pages welcome

## 2017-01-30 NOTE — Progress Notes (Signed)
Patient arrived back to 6n12, alert but drowsy, still in moderate pain, will medicate per orders. IV saline locked and flushed. Patient noted to have 4 lap sites with skin glue, all clean,dry, and intact. Will continue to monitor. No family at bedside at the moment.

## 2017-01-30 NOTE — Progress Notes (Signed)
Carelink Summary Report / Loop Recorder 

## 2017-01-30 NOTE — Anesthesia Preprocedure Evaluation (Addendum)
Anesthesia Evaluation  Patient identified by MRN, date of birth, ID band Patient awake    Reviewed: Allergy & Precautions, NPO status , Patient's Chart, lab work & pertinent test results  History of Anesthesia Complications Negative for: history of anesthetic complications  Airway Mallampati: II  TM Distance: >3 FB Neck ROM: Full    Dental  (+) Chipped, Missing, Dental Advisory Given   Pulmonary neg pulmonary ROS,    breath sounds clear to auscultation       Cardiovascular + Valvular Problems/Murmurs (s/p AVR)  Rhythm:Regular Rate:Normal  ECG: NSR, rate 67  Sees cardiologist  8/18 ECHO: EF 55-60%, mild MR, severe TR  [facility] summary report received. Battery status OK. Normal device function. No new symptom episodes, tachy episodes, brady, or pause episodes. No new AF episodes. Monthly summary reports and ROV with WC PRNLexie Makris RN, BSN   Neuro/Psych TIA   GI/Hepatic Neg liver ROS, GERD  Medicated and Controlled,  Endo/Other  Morbid obesity  Renal/GU Dialysis and ESRFRenal disease (K+ 4.0)On HD, M, W, F     Musculoskeletal Gout   Abdominal (+) + obese,   Peds  Hematology  (+) anemia ,   Anesthesia Other Findings Ambulates with cane  Reproductive/Obstetrics                            Anesthesia Physical  Anesthesia Plan  ASA: IV  Anesthesia Plan: General   Post-op Pain Management:    Induction: Intravenous  PONV Risk Score and Plan: 3 and Ondansetron, Dexamethasone, Treatment may vary due to age or medical condition and Midazolam  Airway Management Planned: Oral ETT  Additional Equipment:   Intra-op Plan:   Post-operative Plan: Extubation in OR  Informed Consent: I have reviewed the patients History and Physical, chart, labs and discussed the procedure including the risks, benefits and alternatives for the proposed anesthesia with the patient or authorized representative  who has indicated his/her understanding and acceptance.   Dental advisory given  Plan Discussed with: CRNA  Anesthesia Plan Comments:        Anesthesia Quick Evaluation

## 2017-01-30 NOTE — Anesthesia Procedure Notes (Signed)
Procedure Name: Intubation Date/Time: 01/30/2017 10:04 AM Performed by: Moshe Salisbury, CRNA Pre-anesthesia Checklist: Patient identified, Emergency Drugs available, Suction available and Patient being monitored Patient Re-evaluated:Patient Re-evaluated prior to induction Oxygen Delivery Method: Circle System Utilized Preoxygenation: Pre-oxygenation with 100% oxygen Induction Type: IV induction Ventilation: Mask ventilation without difficulty Laryngoscope Size: Mac and 3 Grade View: Grade III Tube type: Oral Tube size: 7.5 mm Number of attempts: 1 Airway Equipment and Method: Stylet and Oral airway Placement Confirmation: ETT inserted through vocal cords under direct vision,  positive ETCO2 and breath sounds checked- equal and bilateral Secured at: 21 cm Tube secured with: Tape Dental Injury: Teeth and Oropharynx as per pre-operative assessment

## 2017-01-30 NOTE — Progress Notes (Signed)
Subjective:  Sp Lap chole. / for HD tomorrow   Objective Vital signs in last 24 hours: Vitals:   01/30/17 1150 01/30/17 1151 01/30/17 1159 01/30/17 1236  BP: 108/61 (!) 104/58  (!) 101/58  Pulse: 73 74 73 75  Resp: 12 13 16 18   Temp:   (!) 97.5 F (36.4 C)   TempSrc:      SpO2: 100% 100% 100% 100%  Weight:      Height:       Weight change: -0.79 kg (-11.9 oz)  Physical Exam: General: alert , Nad  Heart: RRR no m.r.g Lungs: CTA  Abdomen: Obese, soft ,Surgical site clean dry  Extremities: Dialysis Access: L fem AVG pos bruit / Judy Pimple cath    OP Dialysis = [facility] MWF 4 hrs EDW 74 kg 3K 2.5 Ca bath F180 dialyzer  Access: L thigh AVG healing, TDC in use now  Mircera 100 q 2 weeks Calcitriol 1 mcg TIW Sensipar 120 mg TIW   Problem/Plan: 1. Cholecystitis  Sp laparoscopic cholecystectomy 2. ESRD - HD MWF (changed to TTS in hosp) am k 4.1  Hd in am , no hep  ,back on normal  MWF  On Friday  3.  HTN/volume -  Volume ok ,On midodrine 10mg  bid  4. Anemia of ESRD- hgb 10.2 ESA due next week , fu hgb trend  5. Secondary hyperparathyroidism - Po vit on hd ,Sensipar 120mg  on HD, Renvela binder with meals  6. HO AV endocarditis ap Replacement - Card seeing, 2d echo just done 7. HO TIA- was on Plavix as op  8. Nutrition = Alb 3.8>3.2 down postop   Ernest Haber, PA-C [facility] [phone] 01/30/2017,3:39 PM  LOS: 2 days   Labs: Basic Metabolic Panel: Recent Labs  Lab 01/28/17 0800 01/29/17 0436 01/30/17 0625  NA 140 139 136  K 4.2 4.5 4.1  CL 103 101 96*  CO2 24 23 26   GLUCOSE 102* 86 96  BUN 25* 32* 13  CREATININE 9.20* 10.60* 5.86*  CALCIUM 8.8* 8.4* 8.8*  PHOS  --  4.6 3.6   Liver Function Tests: Recent Labs  Lab 01/28/17 0800 01/29/17 0436 01/30/17 0625  AST 26  --   --   ALT 15  --   --   ALKPHOS 277*  --   --   BILITOT 0.6  --   --   PROT 7.2  --   --   ALBUMIN 3.8 3.2* 3.2*   Recent Labs  Lab 01/28/17 0800  LIPASE 32   No  results for input(s): AMMONIA in the last 168 hours. CBC: Recent Labs  Lab 01/28/17 0800 01/29/17 0436  WBC 7.0 5.1  HGB 10.8* 10.2*  HCT 33.7* 31.9*  MCV 86.4 86.4  PLT 272 235   Cardiac Enzymes: No results for input(s): CKTOTAL, CKMB, CKMBINDEX, TROPONINI in the last 168 hours. CBG: No results for input(s): GLUCAP in the last 168 hours.  Studies/Results: No results found. Medications: . sodium chloride    . sodium chloride    . sodium chloride    . sodium chloride 10 mL/hr at 01/30/17 0935   . cinacalcet  120 mg Oral Q M,W,F-HD  . famotidine  20 mg Oral Daily  . sevelamer carbonate  800 mg Oral TID WC  . sodium chloride flush  3 mL Intravenous Q12H

## 2017-01-31 ENCOUNTER — Encounter (HOSPITAL_COMMUNITY): Payer: Self-pay | Admitting: Surgery

## 2017-01-31 LAB — RENAL FUNCTION PANEL
ANION GAP: 12 (ref 5–15)
Albumin: 3 g/dL — ABNORMAL LOW (ref 3.5–5.0)
BUN: 21 mg/dL — ABNORMAL HIGH (ref 6–20)
CALCIUM: 9 mg/dL (ref 8.9–10.3)
CO2: 26 mmol/L (ref 22–32)
CREATININE: 7.3 mg/dL — AB (ref 0.44–1.00)
Chloride: 96 mmol/L — ABNORMAL LOW (ref 101–111)
GFR, EST AFRICAN AMERICAN: 6 mL/min — AB (ref >60)
GFR, EST NON AFRICAN AMERICAN: 5 mL/min — AB (ref >60)
Glucose, Bld: 113 mg/dL — ABNORMAL HIGH (ref 65–99)
PHOSPHORUS: 4.2 mg/dL (ref 2.5–4.6)
Potassium: 5 mmol/L (ref 3.5–5.1)
SODIUM: 134 mmol/L — AB (ref 135–145)

## 2017-01-31 LAB — CBC
HCT: 34.1 % — ABNORMAL LOW (ref 36.0–46.0)
HEMOGLOBIN: 11.2 g/dL — AB (ref 12.0–15.0)
MCH: 28.4 pg (ref 26.0–34.0)
MCHC: 32.8 g/dL (ref 30.0–36.0)
MCV: 86.5 fL (ref 78.0–100.0)
PLATELETS: 222 10*3/uL (ref 150–400)
RBC: 3.94 MIL/uL (ref 3.87–5.11)
RDW: 17.7 % — ABNORMAL HIGH (ref 11.5–15.5)
WBC: 10.5 10*3/uL (ref 4.0–10.5)

## 2017-01-31 MED ORDER — MIDODRINE HCL 5 MG PO TABS: 10.0000 mg | ORAL_TABLET | Freq: Once | ORAL | Status: AC

## 2017-01-31 MED ORDER — MIDODRINE HCL 5 MG PO TABS
ORAL_TABLET | ORAL | Status: AC
  Filled 2017-01-31: qty 2

## 2017-01-31 MED ORDER — HEPARIN SODIUM (PORCINE) 5000 UNIT/ML IJ SOLN
5000.0000 [IU] | Freq: Three times a day (TID) | INTRAMUSCULAR | Status: DC
  Filled 2017-01-31: qty 1

## 2017-01-31 MED ORDER — CLOPIDOGREL BISULFATE 75 MG PO TABS
75.0000 mg | ORAL_TABLET | Freq: Every day | ORAL | Status: DC
  Administered 2017-01-31: 75 mg via ORAL
  Filled 2017-01-31 (×2): qty 1

## 2017-01-31 NOTE — Discharge Instructions (Signed)
You were admitted cholelithasis and needed a surgery to remove your gallbladder. After your surgery you were doing well and had some pain, but was tolerable and also tolerated your diet.   There were not changes to your medications.  Please follow up with Dr. Enid Derry on 02/05/17 @ 4pm (please arrive 83min early)  If you have increased pain, nausea, vomiting, fever, or chills please come to emergency room.   ---------- please arrive at least 30 min before your appointment to complete your check in paperwork.  If you are unable to arrive 30 min prior to your appointment time we may have to cancel or reschedule you.  LAPAROSCOPIC SURGERY: POST OP INSTRUCTIONS  1. DIET: Follow a light bland diet the first 24 hours after arrival home, such as soup, liquids, crackers, etc. Be sure to include lots of fluids daily. Avoid fast food or heavy meals as your are more likely to get nauseated. Eat a low fat the next few days after surgery.  2. Take your usually prescribed home medications unless otherwise directed. 3. PAIN CONTROL:  1. Pain is best controlled by a usual combination of three different methods TOGETHER:  1. Ice/Heat 2. Over the counter pain medication 3. Prescription pain medication 2. Most patients will experience some swelling and bruising around the incisions. Ice packs or heating pads (30-60 minutes up to 6 times a day) will help. Use ice for the first few days to help decrease swelling and bruising, then switch to heat to help relax tight/sore spots and speed recovery. Some people prefer to use ice alone, heat alone, alternating between ice & heat. Experiment to what works for you. Swelling and bruising can take several weeks to resolve.  3. It is helpful to take an over-the-counter pain medication regularly for the first few weeks. Choose one of the following that works best for you:  1. Naproxen (Aleve, etc) Two 220mg  tabs twice a day 2. Ibuprofen (Advil, etc) Three 200mg  tabs four times  a day (every meal & bedtime) 3. Acetaminophen (Tylenol, etc) 500-650mg  four times a day (every meal & bedtime) 4. A prescription for pain medication (such as oxycodone, hydrocodone, etc) should be given to you upon discharge. Take your pain medication as prescribed.  1. If you are having problems/concerns with the prescription medicine (does not control pain, nausea, vomiting, rash, itching, etc), please call us [phone] to see if we need to switch you to a different pain medicine that will work better for you and/or control your side effect better. 2. If you need a refill on your pain medication, please contact your pharmacy. They will contact our office to request authorization. Prescriptions will not be filled after 5 pm or on week-ends. 4. Avoid getting constipated. Between the surgery and the pain medications, it is common to experience some constipation. Increasing fluid intake and taking a fiber supplement (such as Metamucil, Citrucel, FiberCon, MiraLax, etc) 1-2 times a day regularly will usually help prevent this problem from occurring. A mild laxative (prune juice, Milk of Magnesia, MiraLax, etc) should be taken according to package directions if there are no bowel movements after 48 hours.  5. Watch out for diarrhea. If you have many loose bowel movements, simplify your diet to bland foods & liquids for a few days. Stop any stool softeners and decrease your fiber supplement. Switching to mild anti-diarrheal medications (Kayopectate, Pepto Bismol) can help. If this worsens or does not improve, please call us. 6. Wash / shower every day. You  may shower over the dressings as they are waterproof. Continue to shower over incision(s) after the dressing is off. 7. Remove your waterproof bandages 5 days after surgery. You may leave the incision open to air. You may replace a dressing/Band-Aid to cover the incision for comfort if you wish.  8. ACTIVITIES as tolerated:  1. You may resume regular  (light) daily activities beginning the next day--such as daily self-care, walking, climbing stairs--gradually increasing activities as tolerated. If you can walk 30 minutes without difficulty, it is safe to try more intense activity such as jogging, treadmill, bicycling, low-impact aerobics, swimming, etc. 2. Save the most intensive and strenuous activity for last such as sit-ups, heavy lifting, contact sports, etc Refrain from any heavy lifting or straining until you are off narcotics for pain control.  3. DO NOT PUSH THROUGH PAIN. Let pain be your guide: If it hurts to do something, don't do it. Pain is your body warning you to avoid that activity for another week until the pain goes down. 4. You may drive when you are no longer taking prescription pain medication, you can comfortably wear a seatbelt, and you can safely maneuver your car and apply brakes. 5. You may have sexual intercourse when it is comfortable.  9. FOLLOW UP in our office  1. Please call [facility] at [phone] to set up an appointment to see your surgeon in the office for a follow-up appointment approximately 2-3 weeks after your surgery. 2. Make sure that you call for this appointment the day you arrive home to insure a convenient appointment time.      10. IF YOU HAVE DISABILITY OR FAMILY LEAVE FORMS, BRING THEM TO THE               OFFICE FOR PROCESSING.   WHEN TO CALL us [phone]:  1. Poor pain control 2. Reactions / problems with new medications (rash/itching, nausea, etc)  3. Fever over 101.5 F (38.5 C) 4. Inability to urinate 5. Nausea and/or vomiting 6. Worsening swelling or bruising 7. Continued bleeding from incision. 8. Increased pain, redness, or drainage from the incision  The clinic staff is available to answer your questions during regular business hours (8:30am-5pm). Please dont hesitate to call and ask to speak to one of our nurses for clinical concerns.  If you have a medical emergency, go to the  nearest emergency room or call 911.  A surgeon from [facility] is always on call at the [facility], [place], [place], [place], [place] [address] ?  MAIN: [phone] ? TOLL FREE: [phone] ?  FAX ([phone]) [phone]  [email or web address]

## 2017-01-31 NOTE — Procedures (Signed)
Patient seen and examined on Hemodialysis. QB 400 mL/ min via L IJ TDC UF goal 1.5L  Tolerating treatment well.  Treatment adjusted as needed.  Madelon Lips MD [facility] pgr [phone] 8:26 AM

## 2017-01-31 NOTE — Progress Notes (Signed)
[facility]/Trauma Progress Note  1 Day Post-Op   Assessment/Plan ESRD on HD - Renal following. HD today. Hx endocarditis and aorticbovine valve replacement GERD Hx TIA   Symptomatic cholelithiasis - afebrile, VSS, no leukocytosis - RUQ U/S 12/3 with mild GB wall thickening, 2.2 cm stone, positive sonographic Murphy's.  - S/P lap chole, Dr. Brantley Stage, 12/05  FEN: Renal ID: Cipro 12/04-12/05 VTE: SCD's, SQ heparin  Plan: if pt tolerates diet she will be clear for discharge from a surgical standpoint.      LOS: 3 days    Subjective: CC: abdominal pain  Pt feeling okay. Mild pain in RUQ. No nausea or vomiting. Only has had apple juice since surgery. Having flatus. Getting hemodialysis   Objective: Vital signs in last 24 hours: Temp:  [97.5 F (36.4 C)-98.2 F (36.8 C)] 97.6 F (36.4 C) (12/06 0645) Pulse Rate:  [63-86] 70 (12/06 0930) Resp:  [11-18] 16 (12/06 0930) BP: (86-119)/(44-61) 106/52 (12/06 0930) SpO2:  [97 %-100 %] 99 % (12/06 0700) Weight:  [164 lb 7.4 oz (74.6 kg)] 164 lb 7.4 oz (74.6 kg) (12/06 0645) Last BM Date: 01/26/17  Intake/Output from previous day: 12/05 0701 - 12/06 0700 In: 836 [P.O.:120; I.V.:516; IV Piggyback:200] Out: 75 [Blood:75] Intake/Output this shift: No intake/output data recorded.  PE: Gen:  Alert, NAD, pleasant, cooperative Pulm:  Rate and effort normal Abd: Soft, not distened, hypoactive BS, incisions with glue intact, mild TTP in RUQ, no guarding Skin: no rashes noted, warm and dry   Anti-infectives: Anti-infectives (From admission, onward)   Start     Dose/Rate Route Frequency Ordered Stop   01/30/17 0600  ciprofloxacin (CIPRO) IVPB 400 mg     400 mg 200 mL/hr over 60 Minutes Intravenous On call to O.R. 01/29/17 1623 01/30/17 0948      Lab Results:  Recent Labs    01/29/17 0436 01/31/17 0631  WBC 5.1 10.5  HGB 10.2* 11.2*  HCT 31.9* 34.1*  PLT 235 222   BMET Recent Labs    01/30/17 0625  01/31/17 0608  NA 136 134*  K 4.1 5.0  CL 96* 96*  CO2 26 26  GLUCOSE 96 113*  BUN 13 21*  CREATININE 5.86* 7.30*  CALCIUM 8.8* 9.0   PT/INR No results for input(s): LABPROT, INR in the last 72 hours. CMP     Component Value Date/Time   NA 134 (L) 01/31/2017 0608   NA 139 12/17/2012   NA 138 07/04/2011 1058   K 5.0 01/31/2017 0608   K 4.9 05/19/2012 0937   CL 96 (L) 01/31/2017 0608   CL 103 07/04/2011 1058   CO2 26 01/31/2017 0608   CO2 29 07/04/2011 1058   GLUCOSE 113 (H) 01/31/2017 0608   GLUCOSE 91 07/04/2011 1058   BUN 21 (H) 01/31/2017 0608   BUN 34 (A) 12/17/2012   BUN 18 07/04/2011 1058   CREATININE 7.30 (H) 01/31/2017 0608   CREATININE 5.85 (H) 10/28/2014 0918   CALCIUM 9.0 01/31/2017 0608   CALCIUM 10.2 (H) 07/04/2011 1058   PROT 7.2 01/28/2017 0800   ALBUMIN 3.0 (L) 01/31/2017 0608   AST 26 01/28/2017 0800   ALT 15 01/28/2017 0800   ALKPHOS 277 (H) 01/28/2017 0800   BILITOT 0.6 01/28/2017 0800   GFRNONAA 5 (L) 01/31/2017 0608   GFRNONAA 7 (L) 07/04/2011 1058   GFRAA 6 (L) 01/31/2017 0608   GFRAA 8 (L) 07/04/2011 1058   Lipase     Component Value Date/Time  LIPASE 32 01/28/2017 0800    Studies/Results: No results found.    Kalman Drape , [facility] 01/31/2017, 9:41 AM Pager: [phone] Consults: [phone] Mon-Fri 7:00 am-4:30 pm Sat-Sun 7:00 am-11:30 am

## 2017-01-31 NOTE — Progress Notes (Signed)
HD completed without issue. Patient tolerated well. 1L UF. BP improved with addition of midodrine. Report called to primary RN.

## 2017-01-31 NOTE — Progress Notes (Signed)
Family Medicine Teaching Service Daily Progress Note Intern Pager: [phone]  Patient name: [name] Marks Medical record number: [id number] Date of birth: 1955/06/25 Age: 61 y.o. Gender: female  Primary Care Provider: Tonette Bihari, MD Consultants: general surgery, nephrology, cardiology  Code Status: full   Pt Overview and Major Events to Date:  Admitted to [facility] on 01/28/17  Assessment and Plan: [name] Marks a 61 y.o.femalepresenting with RUQ pain. PMH is significant forESRD on MWF schedule,endocarditis s/p aortic valve replacement, and cholelithiasis.   Cholelithiasis Patient with history of cholelithiasis with established follow up with outpatient surgery. Patient received laparoscopic cholecystectomy on 12/5. Patient has been afebrile with no leukocytosis.Patient notes improvement in pain and only mild tenderness since surgery. -advance diet, can be discharged if tolerating diet  -restart plavix  -tylenol prn  -K pad prn  -percocet 1 tablet q4hrsas needed -64mcg fentanyl as needed for breakthrough pain -surgery consulted. Appreciate recommendations.  -zofran prn -cardiology consulted for cardiac clearance  ESRD, MWF HD Patient with history of MWF dialysis. Currently being dialyzed through left chest vascular catheter with recent left thigh fistula placement.Per nephrology, will be coverted to TTS schedule while inpatient.Patient received HD today. -nephrology consulted, appreciate recommendation  Hx of aortic valve replacement Patient with history of aortic valve replacement in 2017 2/2 to endocarditis. Currently on plavix for anticoagulation. Unclear why on anticoagulation, possible stent vs. Clotting of HD fistula.Echo performed on 12/5 showing normal left ventrical cavity size, mild mitral regurgitation, mildly dilated right ventricle, and moderate tricuspid reguritation. pulm artery pressure 48mmHg.  -will hold plavix pending surgery  -heparin  for DVT prophylaxis, will discontinue 12 hrs prior to surgery  GERD Home medications: pepcid 20mg  bid -continue home medications  FEN/GI: NPO PPx: heparin   Disposition: discharge to home when cleared by surgery   Subjective:  Patient today notes improvement since surgery. States she has some pain but minimal. Patient denies SOB or CP. Patient denies dizziness or lightheadedness. Patient has not had BM but has had gas.   Objective: Temp:  [97.5 F (36.4 C)-98.2 F (36.8 C)] 97.6 F (36.4 C) (12/06 1051) Pulse Rate:  [63-83] 78 (12/06 1059) Resp:  [12-18] 16 (12/06 1059) BP: (86-112)/(44-61) 100/49 (12/06 1059) SpO2:  [97 %-100 %] 100 % (12/06 1059) Weight:  [162 lb 0.6 oz (73.5 kg)-164 lb 7.4 oz (74.6 kg)] 162 lb 0.6 oz (73.5 kg) (12/06 1051) Physical Exam: General: awake and alert, laying in HD bed  Cardiovascular: RRR, no MRG Respiratory: CTAB, no wheezes, rales or rhonchi Abdomen: bowel sounds normal, tenderness to palpation around incision sites. Scars from laproscopic procedure, no signs of infection  Extremities: non tender, negative Homan's bilaterally   Laboratory: Recent Labs  Lab 01/28/17 0800 01/29/17 0436 01/31/17 0631  WBC 7.0 5.1 10.5  HGB 10.8* 10.2* 11.2*  HCT 33.7* 31.9* 34.1*  PLT 272 235 222   Recent Labs  Lab 01/28/17 0800 01/29/17 0436 01/30/17 0625 01/31/17 0608  NA 140 139 136 134*  K 4.2 4.5 4.1 5.0  CL 103 101 96* 96*  CO2 24 23 26 26   BUN 25* 32* 13 21*  CREATININE 9.20* 10.60* 5.86* 7.30*  CALCIUM 8.8* 8.4* 8.8* 9.0  PROT 7.2  --   --   --   BILITOT 0.6  --   --   --   ALKPHOS 277*  --   --   --   ALT 15  --   --   --   AST 26  --   --   --  GLUCOSE 102* 86 96 113*    Imaging/Diagnostic Tests: US Abdomen Limited Ruq  Result Date: 01/28/2017 CLINICAL DATA:  Right upper quadrant pain EXAM: ULTRASOUND ABDOMEN LIMITED RIGHT UPPER QUADRANT COMPARISON:  10/28/2016 FINDINGS: Gallbladder: 2.2 cm irregular gallstone is present in  the gallbladder. Mild gallbladder wall thickening. Positive sonographic Murphy sign. No pericholecystic fluid. Common bile duct: Diameter: 5 mm Liver: The liver is diffusely increased in echogenicity. There is no obvious nodularity. No focal mass. The portal vein is patent with bidirectional flow. Flow was both hepatopedal and hepatofugal. IMPRESSION: Cholelithiasis There is gallbladder wall thickening and positive sonographic Murphy sign. Acute cholecystitis is not excluded. Correlate clinically as for the need for nuclear medicine imaging to assess for cystic duct patency. Diffusely increased echogenicity in the liver. Differential diagnosis includes diffuse hepatic steatosis and diffuse hepatic parenchymal disease There is bidirectional flow in the portal vein. Reversal of flow is seen with portal hypertension. Electronically Signed   By: Marybelle Killings M.D.   On: 01/28/2017 12:24    Caroline More, DO 01/31/2017, 11:34 AM PGY-1, [facility] Intern pager: [phone], text pages welcome

## 2017-01-31 NOTE — Progress Notes (Signed)
Patient back from dialysis via bed. Alert, oriented x4. No signs of distress

## 2017-01-31 NOTE — Progress Notes (Signed)
  [facility] Progress Note   Assessment/ Plan:   Dialyzes at [facility] MWF 4 hrs EDW 74 kg 3K 2.5 Ca bath F180 dialyzer  Access: L thigh AVG healing, TDC in use now Mircera 100 q 2 weeks Calcitriol 1 mcg TIW Sensipar 120 mg TIW   1 Cholecystitis: s/p lap chole.   2 ESRD: normally MWF, will convert to TTS schedule here due to surg schedule.  No hep for now 3 Hypertension: no BP meds, on midodrine 10 mg BID 4. Anemia of ESRD: Last got Mircera 11/28, not due.  Last OP Hgb 10.8 5. Metabolic Bone Disease: on renvela 1 tab TID with meals, Calcitriol 1 mcg TIW and Sensipar 120 TIW.   6.  Nutrition: alb 3.8, may go down postop 7.  H/o TIA: holding Plavix for OR 8.  H/o AV endocarditis- s/p replacement. 9.  Dispo: per primary    Subjective:    S/p lap chole yesterday.  Did well.  Tolerating HD today.   Objective:   BP (!) 96/54   Pulse 70   Temp 97.6 F (36.4 C) (Oral)   Resp 15   Ht 5' (1.524 m)   Wt 74.6 kg (164 lb 7.4 oz)   SpO2 99%   BMI 32.12 kg/m   Physical Exam: GEN NAD, lying in bed HEENT EOMI PERRL MMM NECK no JVD today PULM clear bilaterally no c/w/r CV RRR no m/r/g crisp S2 ABD soft, + BS, mildly diffusely tender EXT no LE edema NEURO AAO x 3 SKIN no rashes or lesions ACCESS: L thigh AVG + bruit, L TDC in place     Labs: BMET Recent Labs  Lab 01/28/17 0800 01/29/17 0436 01/30/17 0625 01/31/17 0608  NA 140 139 136 134*  K 4.2 4.5 4.1 5.0  CL 103 101 96* 96*  CO2 24 23 26 26   GLUCOSE 102* 86 96 113*  BUN 25* 32* 13 21*  CREATININE 9.20* 10.60* 5.86* 7.30*  CALCIUM 8.8* 8.4* 8.8* 9.0  PHOS  --  4.6 3.6 4.2   CBC Recent Labs  Lab 01/28/17 0800 01/29/17 0436 01/31/17 0631  WBC 7.0 5.1 10.5  HGB 10.8* 10.2* 11.2*  HCT 33.7* 31.9* 34.1*  MCV 86.4 86.4 86.5  PLT 272 235 222    @IMGRELPRIORS @ Medications:    . cinacalcet  120 mg Oral Q M,W,F-HD  . famotidine  20 mg Oral Daily  . midodrine      . midodrine  10 mg Oral  Once in dialysis  . sevelamer carbonate  800 mg Oral TID WC  . sodium chloride flush  3 mL Intravenous Q12H     Madelon Lips, MD [place] pgr [phone] 01/31/2017, 8:20 AM

## 2017-01-31 NOTE — Evaluation (Signed)
Physical Therapy Evaluation Patient Details Name: [name] Marks MRN: [id number] DOB: [birth year] Today's Date: 01/31/2017   History of Present Illness  Pt is a 61 y.o female admitted secondary to R abdomen pain and is s/p laparoscopic cholecystectomy on 12/5. PMH includes ESRD on HD MWV, osteoporosis, s/p L AV fistula placement, s/p aortic valve replacement.   Clinical Impression  Pt is s/p surgery above with deficits below. PTA, pt was using cane for ambulation. Upon eval, pt presenting with weakness, fatigue, abdominal pain, and decreased balance. Required min guard assist for mobility. Pt unsteady with use of cane, and required HHA as well. Educated to use RW for increased stability at home. Reports son can come stay with her. Recommending HHPT at d/c to increase independence and safety with functional mobility. Will continue to follow acutely to ensure safety with mobility.     Follow Up Recommendations Home health PT;Supervision for mobility/OOB    Equipment Recommendations  None recommended by PT    Recommendations for Other Services       Precautions / Restrictions Precautions Precautions: Fall Restrictions Weight Bearing Restrictions: No      Mobility  Bed Mobility Overal bed mobility: Needs Assistance Bed Mobility: Rolling;Sidelying to Sit;Sit to Sidelying Rolling: Supervision Sidelying to sit: Supervision     Sit to sidelying: Supervision General bed mobility comments: Supervision for safety. Educated about use of log roll technique to decrease stress on abdomen.   Transfers Overall transfer level: Needs assistance Equipment used: Straight cane Transfers: Sit to/from Stand Sit to Stand: Min guard         General transfer comment: Min guard for safety.   Ambulation/Gait Ambulation/Gait assistance: Min guard Ambulation Distance (Feet): 50 Feet Assistive device: 1 person hand held assist;Straight cane Gait Pattern/deviations: Step-through pattern;Decreased  stride length Gait velocity: Decreased  Gait velocity interpretation: Below normal speed for age/gender General Gait Details: Slow, slightly unsteady gait, however, noted no LOB. Pt using HHA and cane for stability. Educated about use of RW at home to increase stability and safety at home   Stairs            Wheelchair Mobility    Modified Rankin (Stroke Patients Only)       Balance Overall balance assessment: Needs assistance Sitting-balance support: No upper extremity supported;Feet supported Sitting balance-Leahy Scale: Good     Standing balance support: No upper extremity supported;Bilateral upper extremity supported;During functional activity Standing balance-Leahy Scale: Fair Standing balance comment: Able to stand at sink without UE support                              Pertinent Vitals/Pain Pain Assessment: 0-10 Pain Score: 1  Pain Location: abdomen  Pain Descriptors / Indicators: Aching Pain Intervention(s): Limited activity within patient's tolerance;Monitored during session;Repositioned    Home Living Family/patient expects to be discharged to:: Private residence Living Arrangements: Alone Available Help at Discharge: Family;Available PRN/intermittently Type of Home: House Home Access: Stairs to enter Entrance Stairs-Rails: None Entrance Stairs-Number of Steps: 3 Home Layout: One level Home Equipment: Walker - 2 wheels;Cane - single point;Wheelchair - manual;Bedside commode      Prior Function Level of Independence: Independent with assistive device(s)         Comments: Uses cane for ambulation      Hand Dominance   Dominant Hand: Left    Extremity/Trunk Assessment   Upper Extremity Assessment Upper Extremity Assessment: Overall WFL for tasks assessed  Lower Extremity Assessment Lower Extremity Assessment: Generalized weakness    Cervical / Trunk Assessment Cervical / Trunk Assessment: Normal  Communication       Cognition Arousal/Alertness: Awake/alert Behavior During Therapy: WFL for tasks assessed/performed Overall Cognitive Status: Within Functional Limits for tasks assessed                                        General Comments General comments (skin integrity, edema, etc.): Educated pt about having someone stay with her initially and pt reports son can likely stay.     Exercises     Assessment/Plan    PT Assessment Patient needs continued PT services  PT Problem List Decreased strength;Decreased balance;Decreased mobility;Decreased knowledge of use of DME;Decreased activity tolerance;Pain       PT Treatment Interventions Gait training;DME instruction;Functional mobility training;Stair training;Therapeutic activities;Therapeutic exercise;Balance training;Neuromuscular re-education;Patient/family education    PT Goals (Current goals can be found in the Care Plan section)  Acute Rehab PT Goals Patient Stated Goal: to get better  PT Goal Formulation: With patient Time For Goal Achievement: 02/14/17 Potential to Achieve Goals: Good    Frequency Min 3X/week   Barriers to discharge Decreased caregiver support      Co-evaluation               AM-PAC PT "6 Clicks" Daily Activity  Outcome Measure Difficulty turning over in bed (including adjusting bedclothes, sheets and blankets)?: A Little Difficulty moving from lying on back to sitting on the side of the bed? : A Little Difficulty sitting down on and standing up from a chair with arms (e.g., wheelchair, bedside commode, etc,.)?: Unable Help needed moving to and from a bed to chair (including a wheelchair)?: A Little Help needed walking in hospital room?: A Little Help needed climbing 3-5 steps with a railing? : A Lot 6 Click Score: 15    End of Session Equipment Utilized During Treatment: Gait belt Activity Tolerance: Patient limited by fatigue Patient left: in bed;with call bell/phone within  reach Nurse Communication: Mobility status PT Visit Diagnosis: Unsteadiness on feet (R26.81);Muscle weakness (generalized) (M62.81);Pain Pain - part of body: (abdomen )    Time: 5176-1607 PT Time Calculation (min) (ACUTE ONLY): 16 min   Charges:   PT Evaluation $PT Eval Low Complexity: 1 Low     PT G Codes:       Leighton Ruff, PT, DPT  Acute Rehabilitation Services  Pager: [phone]   Rudean Hitt 01/31/2017, 7:06 PM

## 2017-01-31 NOTE — Progress Notes (Signed)
HD initiated via L Chest HD catheter. No heparin treatment. BP low. Will administer midodrine. UF goal lowered until bp improves. Continue to monitor patient.

## 2017-02-01 DIAGNOSIS — Z992 Dependence on renal dialysis: Secondary | ICD-10-CM

## 2017-02-01 LAB — COMPREHENSIVE METABOLIC PANEL
ALBUMIN: 2.7 g/dL — AB (ref 3.5–5.0)
ALT: 15 U/L (ref 14–54)
AST: 23 U/L (ref 15–41)
Alkaline Phosphatase: 207 U/L — ABNORMAL HIGH (ref 38–126)
Anion gap: 9 (ref 5–15)
BUN: 15 mg/dL (ref 6–20)
CHLORIDE: 95 mmol/L — AB (ref 101–111)
CO2: 29 mmol/L (ref 22–32)
Calcium: 8.7 mg/dL — ABNORMAL LOW (ref 8.9–10.3)
Creatinine, Ser: 4.84 mg/dL — ABNORMAL HIGH (ref 0.44–1.00)
GFR calc Af Amer: 10 mL/min — ABNORMAL LOW (ref >60)
GFR, EST NON AFRICAN AMERICAN: 9 mL/min — AB (ref >60)
GLUCOSE: 91 mg/dL (ref 65–99)
POTASSIUM: 4.1 mmol/L (ref 3.5–5.1)
SODIUM: 133 mmol/L — AB (ref 135–145)
Total Bilirubin: 0.8 mg/dL (ref 0.3–1.2)
Total Protein: 5.7 g/dL — ABNORMAL LOW (ref 6.5–8.1)

## 2017-02-01 LAB — CBC
HEMATOCRIT: 32.7 % — AB (ref 36.0–46.0)
HEMOGLOBIN: 10.6 g/dL — AB (ref 12.0–15.0)
MCH: 28 pg (ref 26.0–34.0)
MCHC: 32.4 g/dL (ref 30.0–36.0)
MCV: 86.5 fL (ref 78.0–100.0)
Platelets: 186 10*3/uL (ref 150–400)
RBC: 3.78 MIL/uL — AB (ref 3.87–5.11)
RDW: 17.7 % — ABNORMAL HIGH (ref 11.5–15.5)
WBC: 8.1 10*3/uL (ref 4.0–10.5)

## 2017-02-01 LAB — MAGNESIUM: MAGNESIUM: 1.7 mg/dL (ref 1.7–2.4)

## 2017-02-01 LAB — PHOSPHORUS: Phosphorus: 3.5 mg/dL (ref 2.5–4.6)

## 2017-02-01 MED ORDER — SODIUM CHLORIDE 0.9 % IV SOLN: 100.0000 mL | INTRAVENOUS | Status: DC | PRN

## 2017-02-01 MED ORDER — CALCITRIOL 0.5 MCG PO CAPS
ORAL_CAPSULE | ORAL | Status: AC
  Administered 2017-02-01: 1 ug via ORAL
  Filled 2017-02-01: qty 2

## 2017-02-01 MED ORDER — POLYETHYLENE GLYCOL 3350 17 G PO PACK: 17.0000 g | PACK | Freq: Every day | ORAL | 0 refills | Status: DC | PRN

## 2017-02-01 NOTE — Progress Notes (Signed)
Nutrition Follow-up  DOCUMENTATION CODES:   Obesity unspecified  INTERVENTION:   -Continue snacks TID  NUTRITION DIAGNOSIS:   Increased nutrient needs related to post-op healing as evidenced by estimated needs.  Ongoing  GOAL:   Patient will meet greater than or equal to 90% of their needs  Progressing  MONITOR:   PO intake, Labs, Weight trends, Skin, I & O's  REASON FOR ASSESSMENT:   Malnutrition Screening Tool    ASSESSMENT:   [name] Marks is a 61 y.o. female presenting with RUQ pain. PMH is significant for ESRD on MWF schedule, endocarditis s/p aortic valve replacement, and cholelithiasis.   12/5- s/p lap chole  Pt receiving nursing care at time of visit. Noted lunch tray on tray table; pt consumed about 75% of meal tray. Noted documented meal completion 75% of meals.   Per RNCM note, pt will discharge home with home health services once medically stable.   Labs reviewed: Na: 133.   Diet Order:  Diet renal with fluid restriction Fluid restriction: 1200 mL Fluid; Room service appropriate? Yes; Fluid consistency: Thin  EDUCATION NEEDS:   Education needs have been addressed  Skin:  Skin Assessment: Skin Integrity Issues: Skin Integrity Issues:: Incisions Incisions: closed abdomen  Last BM:  01/30/17  Height:   Ht Readings from Last 1 Encounters:  01/30/17 5' (1.524 m)    Weight:   Wt Readings from Last 1 Encounters:  01/31/17 162 lb 0.6 oz (73.5 kg)    Ideal Body Weight:  45.5 kg  BMI:  Body mass index is 31.65 kg/m.  Estimated Nutritional Needs:   Kcal:  1600-1800  Protein:  90-105 grams  Fluid:  1-1.5 L    Special Ranes A. Jimmye Norman, RD, LDN, CDE Pager: [phone] After hours Pager: [phone]

## 2017-02-01 NOTE — Progress Notes (Signed)
  [facility] Progress Note   Assessment/ Plan:   Dialyzes at [facility] MWF 4 hrs EDW 74 kg 3K 2.5 Ca bath F180 dialyzer  Access: L thigh AVG healing, TDC in use now Mircera 100 q 2 weeks Calcitriol 1 mcg TIW Sensipar 120 mg TIW   1 Cholecystitis: s/p lap chole 12/5  2 ESRD: normally MWF, TTS this week d/t surg schedule. Plan HD today to get back on schedule before discharge. No hep for now 3 Hypertension: no BP meds, on midodrine 10 mg BID 4. Anemia of ESRD: Last got Mircera 11/28, not due.  Last OP Hgb 10.8 5. Metabolic Bone Disease: on renvela 1 tab TID with meals, Calcitriol 1 mcg TIW and Sensipar 120 TIW.   6.  Nutrition: alb 3.8, may go down postop 7.  H/o TIA: holding Plavix for OR 8.  H/o AV endocarditis- s/p replacement. 9.  Dispo: per primary    Subjective:    Feeling better today, ate some breakfast. Abdominal pain improving.    Objective:   BP (!) 93/33 (BP Location: Right Arm)   Pulse 84   Temp 98.2 F (36.8 C) (Oral)   Resp 16   Ht 5' (1.524 m)   Wt 73.5 kg (162 lb 0.6 oz)   SpO2 94%   BMI 31.65 kg/m   Physical Exam: GEN NAD, lying in bed HEENT EOMI PERRL MMM NECK no JVD today PULM clear bilaterally no c/w/r CV RRR no m/r/g crisp S2 ABD soft, + BS, mildly diffusely tender EXT no LE edema NEURO AAO x 3 SKIN no rashes or lesions ACCESS: L thigh AVG + bruit, L TDC in place     Labs: BMET Recent Labs  Lab 01/28/17 0800 01/29/17 0436 01/30/17 0625 01/31/17 0608 02/01/17 0537  NA 140 139 136 134* 133*  K 4.2 4.5 4.1 5.0 4.1  CL 103 101 96* 96* 95*  CO2 24 23 26 26 29   GLUCOSE 102* 86 96 113* 91  BUN 25* 32* 13 21* 15  CREATININE 9.20* 10.60* 5.86* 7.30* 4.84*  CALCIUM 8.8* 8.4* 8.8* 9.0 8.7*  PHOS  --  4.6 3.6 4.2 3.5   CBC Recent Labs  Lab 01/28/17 0800 01/29/17 0436 01/31/17 0631  WBC 7.0 5.1 10.5  HGB 10.8* 10.2* 11.2*  HCT 33.7* 31.9* 34.1*  MCV 86.4 86.4 86.5  PLT 272 235 222    @IMGRELPRIORS @ Medications:     . cinacalcet  120 mg Oral Q M,W,F-HD  . clopidogrel  75 mg Oral Daily  . famotidine  20 mg Oral Daily  . sevelamer carbonate  800 mg Oral TID WC  . sodium chloride flush  3 mL Intravenous Q12H    [name] PA-C [place] Pager [phone] 02/01/2017,9:18 AM

## 2017-02-01 NOTE — Progress Notes (Signed)
[facility] Patient returned to floor from dialysis. Discharge instructions provided.

## 2017-02-01 NOTE — Care Management Note (Signed)
Case Management Note  Patient Details  Name: Janelli A [name] MRN: [id number] Date of Birth: Dec 25, 1955  Subjective/Objective:                    Action/Plan:  PT recommending home health PT and aide. Discussed with patient at bedside. Patient agreeable. Choice offered. Patient would like [facility] .   Spoke with Dr Tammi Klippel she will enter orders and face to face.   Referral given to Neoma Laming with [facility].  Patient has a cane at home and does not not 3 in 1 or walker  Expected Discharge Date:                  Expected Discharge Plan:  [place]  In-House Referral:     Discharge planning Services  CM Consult  Post Acute Care Choice:    Choice offered to:  Patient  DME Arranged:  N/A DME Agency:  NA  HH Arranged:  PT, Nurse's Aide [place] Agency:  [place]  Status of Service:  Completed, signed off  If discussed at [place] of Stay Meetings, dates discussed:    Additional Comments:  Marilu Favre, RN 02/01/2017, 10:11 AM

## 2017-02-01 NOTE — Procedures (Signed)
Patient seen and examined on Hemodialysis. QB 400 mL/ min via L IJ TDC UF goal 1.5L  Tolerating treatment well.  Home today after HD  Treatment adjusted as needed.  Madelon Lips MD [facility] pgr [phone] 2:51 PM

## 2017-02-01 NOTE — Care Management Important Message (Signed)
Important Message  Patient Details  Name: [name] Marks MRN: [id number] Date of Birth: March 13, 1955   Medicare Important Message Given:  Yes    Yani Lal 02/01/2017, 2:12 PM

## 2017-02-01 NOTE — Progress Notes (Signed)
Physical Therapy Treatment Patient Details Name: [name] Marks MRN: [id number] DOB: 08-08-1955 Today's Date: 02/01/2017    History of Present Illness Pt is a 61 y.o female admitted secondary to R abdomen pain and is s/p laparoscopic cholecystectomy on 12/5. PMH includes ESRD on HD MWV, osteoporosis, s/p L AV fistula placement, s/p aortic valve replacement.     PT Comments    Patient is making progress toward mobility goals. Pt tolerated increased gait with use of RW and able to ascend/descend stairs with min A. Pt educated on log roll technique and safe use of AD and need for assistance when going up stairs. Current plan remains appropriate.    Follow Up Recommendations  Home health PT;Supervision for mobility/OOB     Equipment Recommendations  Rolling walker with 5" wheels    Recommendations for Other Services       Precautions / Restrictions Precautions Precautions: Fall Restrictions Weight Bearing Restrictions: No    Mobility  Bed Mobility Overal bed mobility: Needs Assistance Bed Mobility: Rolling;Sidelying to Sit;Sit to Sidelying Rolling: Supervision Sidelying to sit: Supervision     Sit to sidelying: Supervision General bed mobility comments: cues for sequencing and technique  Transfers Overall transfer level: Needs assistance Equipment used: None Transfers: Sit to/from Stand Sit to Stand: Supervision         General transfer comment: supervision for safety  Ambulation/Gait Ambulation/Gait assistance: Min guard;Supervision Ambulation Distance (Feet): 150 Feet Assistive device: Rolling walker (2 wheeled) Gait Pattern/deviations: Step-through pattern;Decreased stride length Gait velocity: Decreased    General Gait Details: cues for posture and proximity of RW; grossly supervision for safety   Stairs Stairs: Yes   Stair Management: One rail Right;Step to pattern;Forwards Number of Stairs: 10 General stair comments: cues for sequencing; pt used HHA  on L side and R hand rail for balance; pt educated on sequencing and technique for use of SPC and HHA for home entrance as she does not have hand rails  Wheelchair Mobility    Modified Rankin (Stroke Patients Only)       Balance Overall balance assessment: Needs assistance Sitting-balance support: No upper extremity supported;Feet supported Sitting balance-Leahy Scale: Good     Standing balance support: No upper extremity supported;Bilateral upper extremity supported;During functional activity Standing balance-Leahy Scale: Fair                              Cognition Arousal/Alertness: Awake/alert Behavior During Therapy: WFL for tasks assessed/performed Overall Cognitive Status: Within Functional Limits for tasks assessed                                        Exercises      General Comments        Pertinent Vitals/Pain Pain Assessment: Faces Faces Pain Scale: Hurts little more Pain Location: abdomen  Pain Descriptors / Indicators: Sore;Guarding Pain Intervention(s): Limited activity within patient's tolerance;Monitored during session;Repositioned    Home Living                      Prior Function            PT Goals (current goals can now be found in the care plan section) Acute Rehab PT Goals Patient Stated Goal: to get better  PT Goal Formulation: With patient Time For Goal Achievement: 02/14/17 Potential to Achieve Goals: Good  Progress towards PT goals: Progressing toward goals    Frequency    Min 3X/week      PT Plan Current plan remains appropriate    Co-evaluation              AM-PAC PT "6 Clicks" Daily Activity  Outcome Measure  Difficulty turning over in bed (including adjusting bedclothes, sheets and blankets)?: A Little Difficulty moving from lying on back to sitting on the side of the bed? : A Little Difficulty sitting down on and standing up from a chair with arms (e.g., wheelchair, bedside  commode, etc,.)?: Unable Help needed moving to and from a bed to chair (including a wheelchair)?: A Little Help needed walking in hospital room?: A Little Help needed climbing 3-5 steps with a railing? : A Little 6 Click Score: 16    End of Session Equipment Utilized During Treatment: Gait belt Activity Tolerance: Patient tolerated treatment well Patient left: in bed;with call bell/phone within reach Nurse Communication: Mobility status PT Visit Diagnosis: Unsteadiness on feet (R26.81);Muscle weakness (generalized) (M62.81);Pain Pain - part of body: (abdomen )     Time: 8295-6213 PT Time Calculation (min) (ACUTE ONLY): 20 min  Charges:  $Gait Training: 8-22 mins                    G Codes:       Earney Navy, PTA Pager: [phone]     Darliss Cheney 02/01/2017, 11:58 AM

## 2017-02-01 NOTE — Progress Notes (Signed)
Family Medicine Teaching Service Daily Progress Note Intern Pager: [phone]  Patient name: [name] Marks Medical record number: [id number] Date of birth: 1955/04/07 Age: 61 y.o. Gender: female  Primary Care Provider: Tonette Bihari, MD Consultants: General surgery  Code Status: full   Pt Overview and Major Events to Date:  Admitted to [facility] on 01/28/17  Assessment and Plan: [name] Marks a 61 y.o.femalepresenting with RUQ pain. PMH is significant forESRD on MWF schedule,endocarditis s/p aortic valve replacement, and cholelithiasis.   Cholelithiasis Patient with history of cholelithiasis with established follow up with outpatient surgery. POD #2. Patient has been afebrile with no leukocytosis.Patient reports RUQ pain.  -can be discharged if tolerating diet  -tylenol prn  -K pad prn  -percocet 1 tablet q4hrsas needed -25mcg fentanyl as needed for breakthrough pain -surgery consulted. Appreciate recommendations.  -zofran prn -cardiology consulted for cardiac clearance -continue to work with PT -OOB, ambulate patient -CBC to rule out possible post op infection   ESRD, MWF HD Patient with history of MWF dialysis. Currently being dialyzed through left chest vascular catheter with recent left thigh fistula placement.Per nephrology, will be coverted to TTS schedule while inpatient.Patient received HD today. -nephrology consulted, appreciate recommendation  Hx of aortic valve replacement Patient with history of aortic valve replacement in 2017 2/2 to endocarditis. Currently on plavix for anticoagulation. Unclear why on anticoagulation, possible stent vs. Clotting of HD fistula.Echo performed on 12/5 showing normal left ventrical cavity size, mild mitral regurgitation, mildly dilated right ventricle, and moderate tricuspid reguritation. pulm artery pressure 58mmHg.  -continue Plavix   GERD Home medications: pepcid 20mg  bid -continue home  medications  FEN/GI:renal diet w/ fluid restriction  PPx: Plavix    Disposition: can be discharged if CBC is wnl   Subjective:  Patient today reports RUQ pain. States that she is not eating well but this is because she does not like food, able to tolerate the food that she does eat. Has not had BM but reports passing gas.   Objective: Temp:  [97.5 F (36.4 C)-99.1 F (37.3 C)] 98.2 F (36.8 C) (12/07 0515) Pulse Rate:  [75-84] 84 (12/06 2331) Resp:  [16-18] 16 (12/07 0515) BP: (93-98)/(33-38) 93/33 (12/07 0515) SpO2:  [94 %-99 %] 94 % (12/07 0515) Physical Exam: General: awake and alert, laying in bed watching television, NAD  Cardiovascular: RRR, no MRG  Respiratory: CTAB, no wheezes, rales or rhonchi  Abdomen: soft, tenderness to palpation of RUQ, bowel sounds normal x 4 quadrants  Extremities: soft, non tender, no edema   Laboratory: Recent Labs  Lab 01/28/17 0800 01/29/17 0436 01/31/17 0631  WBC 7.0 5.1 10.5  HGB 10.8* 10.2* 11.2*  HCT 33.7* 31.9* 34.1*  PLT 272 235 222   Recent Labs  Lab 01/28/17 0800  01/30/17 0625 01/31/17 0608 02/01/17 0537  NA 140   < > 136 134* 133*  K 4.2   < > 4.1 5.0 4.1  CL 103   < > 96* 96* 95*  CO2 24   < > 26 26 29   BUN 25*   < > 13 21* 15  CREATININE 9.20*   < > 5.86* 7.30* 4.84*  CALCIUM 8.8*   < > 8.8* 9.0 8.7*  PROT 7.2  --   --   --  5.7*  BILITOT 0.6  --   --   --  0.8  ALKPHOS 277*  --   --   --  207*  ALT 15  --   --   --  15  AST 26  --   --   --  23  GLUCOSE 102*   < > 96 113* 91   < > = values in this interval not displayed.     Imaging/Diagnostic Tests: US Abdomen Limited Ruq  Result Date: 01/28/2017 CLINICAL DATA:  Right upper quadrant pain EXAM: ULTRASOUND ABDOMEN LIMITED RIGHT UPPER QUADRANT COMPARISON:  10/28/2016 FINDINGS: Gallbladder: 2.2 cm irregular gallstone is present in the gallbladder. Mild gallbladder wall thickening. Positive sonographic Murphy sign. No pericholecystic fluid. Common bile  duct: Diameter: 5 mm Liver: The liver is diffusely increased in echogenicity. There is no obvious nodularity. No focal mass. The portal vein is patent with bidirectional flow. Flow was both hepatopedal and hepatofugal. IMPRESSION: Cholelithiasis There is gallbladder wall thickening and positive sonographic Murphy sign. Acute cholecystitis is not excluded. Correlate clinically as for the need for nuclear medicine imaging to assess for cystic duct patency. Diffusely increased echogenicity in the liver. Differential diagnosis includes diffuse hepatic steatosis and diffuse hepatic parenchymal disease There is bidirectional flow in the portal vein. Reversal of flow is seen with portal hypertension. Electronically Signed   By: Marybelle Killings M.D.   On: 01/28/2017 12:24    Caroline More, DO 02/01/2017, 12:28 PM PGY-1, [facility] Intern pager: [phone], text pages welcome

## 2017-02-04 DIAGNOSIS — D509 Iron deficiency anemia, unspecified: Secondary | ICD-10-CM | POA: Diagnosis not present

## 2017-02-04 DIAGNOSIS — D631 Anemia in chronic kidney disease: Secondary | ICD-10-CM | POA: Diagnosis not present

## 2017-02-04 DIAGNOSIS — N186 End stage renal disease: Secondary | ICD-10-CM | POA: Diagnosis not present

## 2017-02-04 DIAGNOSIS — N2581 Secondary hyperparathyroidism of renal origin: Secondary | ICD-10-CM | POA: Diagnosis not present

## 2017-02-05 ENCOUNTER — Inpatient Hospital Stay: Payer: Medicare Other | Admitting: Family Medicine

## 2017-02-06 DIAGNOSIS — D631 Anemia in chronic kidney disease: Secondary | ICD-10-CM | POA: Diagnosis not present

## 2017-02-06 DIAGNOSIS — N2581 Secondary hyperparathyroidism of renal origin: Secondary | ICD-10-CM | POA: Diagnosis not present

## 2017-02-06 DIAGNOSIS — D509 Iron deficiency anemia, unspecified: Secondary | ICD-10-CM | POA: Diagnosis not present

## 2017-02-06 DIAGNOSIS — N186 End stage renal disease: Secondary | ICD-10-CM | POA: Diagnosis not present

## 2017-02-07 DIAGNOSIS — M109 Gout, unspecified: Secondary | ICD-10-CM | POA: Diagnosis not present

## 2017-02-07 DIAGNOSIS — D631 Anemia in chronic kidney disease: Secondary | ICD-10-CM | POA: Diagnosis not present

## 2017-02-07 DIAGNOSIS — Z992 Dependence on renal dialysis: Secondary | ICD-10-CM | POA: Diagnosis not present

## 2017-02-07 DIAGNOSIS — N186 End stage renal disease: Secondary | ICD-10-CM | POA: Diagnosis not present

## 2017-02-07 DIAGNOSIS — E669 Obesity, unspecified: Secondary | ICD-10-CM | POA: Diagnosis not present

## 2017-02-07 DIAGNOSIS — K219 Gastro-esophageal reflux disease without esophagitis: Secondary | ICD-10-CM | POA: Diagnosis not present

## 2017-02-07 DIAGNOSIS — I272 Pulmonary hypertension, unspecified: Secondary | ICD-10-CM | POA: Diagnosis not present

## 2017-02-07 DIAGNOSIS — Z952 Presence of prosthetic heart valve: Secondary | ICD-10-CM | POA: Diagnosis not present

## 2017-02-07 DIAGNOSIS — Z48815 Encounter for surgical aftercare following surgery on the digestive system: Secondary | ICD-10-CM | POA: Diagnosis not present

## 2017-02-07 DIAGNOSIS — M810 Age-related osteoporosis without current pathological fracture: Secondary | ICD-10-CM | POA: Diagnosis not present

## 2017-02-08 ENCOUNTER — Telehealth: Payer: Self-pay | Admitting: Internal Medicine

## 2017-02-08 DIAGNOSIS — N2581 Secondary hyperparathyroidism of renal origin: Secondary | ICD-10-CM | POA: Diagnosis not present

## 2017-02-08 DIAGNOSIS — D631 Anemia in chronic kidney disease: Secondary | ICD-10-CM | POA: Diagnosis not present

## 2017-02-08 DIAGNOSIS — D509 Iron deficiency anemia, unspecified: Secondary | ICD-10-CM | POA: Diagnosis not present

## 2017-02-08 DIAGNOSIS — N186 End stage renal disease: Secondary | ICD-10-CM | POA: Diagnosis not present

## 2017-02-08 LAB — CUP PACEART REMOTE DEVICE CHECK
MDC IDC PG IMPLANT DT: 20180806
MDC IDC SESS DTM: 20181204214107

## 2017-02-08 NOTE — Telephone Encounter (Signed)
Will forward to MD to give ok for these orders. Jazmin Hartsell,CMA

## 2017-02-08 NOTE — Telephone Encounter (Signed)
[name] Marks with [facility] called and wants orders for in home PT for 1 time a week for 1 week, 2 times a week for 4 weeks.  Contact [name] Marks at [phone] with any questions or concerns

## 2017-02-11 DIAGNOSIS — D631 Anemia in chronic kidney disease: Secondary | ICD-10-CM | POA: Diagnosis not present

## 2017-02-11 DIAGNOSIS — D509 Iron deficiency anemia, unspecified: Secondary | ICD-10-CM | POA: Diagnosis not present

## 2017-02-11 DIAGNOSIS — N186 End stage renal disease: Secondary | ICD-10-CM | POA: Diagnosis not present

## 2017-02-11 DIAGNOSIS — N2581 Secondary hyperparathyroidism of renal origin: Secondary | ICD-10-CM | POA: Diagnosis not present

## 2017-02-11 NOTE — Telephone Encounter (Signed)
Please give verbal orders.

## 2017-02-11 NOTE — Telephone Encounter (Signed)
Verbal orders given to jerilynn. Deseree Kennon Holter, CMA

## 2017-02-12 DIAGNOSIS — D631 Anemia in chronic kidney disease: Secondary | ICD-10-CM | POA: Diagnosis not present

## 2017-02-12 DIAGNOSIS — N186 End stage renal disease: Secondary | ICD-10-CM | POA: Diagnosis not present

## 2017-02-12 DIAGNOSIS — I272 Pulmonary hypertension, unspecified: Secondary | ICD-10-CM | POA: Diagnosis not present

## 2017-02-12 DIAGNOSIS — M810 Age-related osteoporosis without current pathological fracture: Secondary | ICD-10-CM | POA: Diagnosis not present

## 2017-02-12 DIAGNOSIS — Z48815 Encounter for surgical aftercare following surgery on the digestive system: Secondary | ICD-10-CM | POA: Diagnosis not present

## 2017-02-12 DIAGNOSIS — K219 Gastro-esophageal reflux disease without esophagitis: Secondary | ICD-10-CM | POA: Diagnosis not present

## 2017-02-13 DIAGNOSIS — N2581 Secondary hyperparathyroidism of renal origin: Secondary | ICD-10-CM | POA: Diagnosis not present

## 2017-02-13 DIAGNOSIS — D509 Iron deficiency anemia, unspecified: Secondary | ICD-10-CM | POA: Diagnosis not present

## 2017-02-13 DIAGNOSIS — N186 End stage renal disease: Secondary | ICD-10-CM | POA: Diagnosis not present

## 2017-02-13 DIAGNOSIS — D631 Anemia in chronic kidney disease: Secondary | ICD-10-CM | POA: Diagnosis not present

## 2017-02-14 ENCOUNTER — Ambulatory Visit (INDEPENDENT_AMBULATORY_CARE_PROVIDER_SITE_OTHER): Payer: Medicare Other | Admitting: Internal Medicine

## 2017-02-14 DIAGNOSIS — I639 Cerebral infarction, unspecified: Secondary | ICD-10-CM

## 2017-02-14 DIAGNOSIS — D631 Anemia in chronic kidney disease: Secondary | ICD-10-CM | POA: Diagnosis not present

## 2017-02-14 DIAGNOSIS — M810 Age-related osteoporosis without current pathological fracture: Secondary | ICD-10-CM | POA: Diagnosis not present

## 2017-02-14 DIAGNOSIS — I272 Pulmonary hypertension, unspecified: Secondary | ICD-10-CM | POA: Diagnosis not present

## 2017-02-14 DIAGNOSIS — N186 End stage renal disease: Secondary | ICD-10-CM | POA: Diagnosis not present

## 2017-02-14 DIAGNOSIS — Z9049 Acquired absence of other specified parts of digestive tract: Secondary | ICD-10-CM | POA: Diagnosis not present

## 2017-02-14 DIAGNOSIS — Z48815 Encounter for surgical aftercare following surgery on the digestive system: Secondary | ICD-10-CM | POA: Diagnosis not present

## 2017-02-14 DIAGNOSIS — K219 Gastro-esophageal reflux disease without esophagitis: Secondary | ICD-10-CM | POA: Diagnosis not present

## 2017-02-14 HISTORY — DX: Acquired absence of other specified parts of digestive tract: Z90.49

## 2017-02-14 NOTE — Progress Notes (Signed)
   [name] Kerrin Mo, MD Phone: [phone]  Reason For Visit: Follow up for Discharge   #Patient is doing well.  She denies any concerns.  She has been eating and drinking well.  She denies any nausea or vomiting.  She denies any abdominal bone pain.  She states that she will go to the surgeon for follow-up after seeing me.  She denies any fevers or chills.  Patient denies any constipation  Past Medical History Reviewed problem list.  Medications- reviewed and updated No additions to family history Social history- patient is a non- smoker  Objective: BP 104/60 (BP Location: Right Arm, Patient Position: Sitting, Cuff Size: Normal)   Pulse 67   Temp 97.8 F (36.6 C) (Oral)   Wt 158 lb 9.6 oz (71.9 kg)   SpO2 99%   BMI 30.97 kg/m  Gen: NAD, alert, cooperative with exam Cardio: regular rate and rhythm, S1S2 heard, no murmurs appreciated Pulm: clear to auscultation bilaterally, no wheezes, rhonchi or rales GI: soft, non-tender, non-distended, bowel sounds present, small scars from trochanter placement, well-healed   Assessment/Plan: See problem based a/p  S/P cholecystectomy Patient doing well status post cholecystectomy.  No issues with eating or drinking.  No nausea, vomiting, fevers, chills.  Surgical sites have healed well -No further follow-up for this issue needed -Patient to follow-up with me in 6 months for general checkup.

## 2017-02-14 NOTE — Patient Instructions (Signed)
It was lovely to see you today.  I think you are doing excellent after your cholecystectomy.  Please follow-up with me in June.

## 2017-02-15 DIAGNOSIS — D631 Anemia in chronic kidney disease: Secondary | ICD-10-CM | POA: Diagnosis not present

## 2017-02-15 DIAGNOSIS — N2581 Secondary hyperparathyroidism of renal origin: Secondary | ICD-10-CM | POA: Diagnosis not present

## 2017-02-15 DIAGNOSIS — D509 Iron deficiency anemia, unspecified: Secondary | ICD-10-CM | POA: Diagnosis not present

## 2017-02-15 DIAGNOSIS — N186 End stage renal disease: Secondary | ICD-10-CM | POA: Diagnosis not present

## 2017-02-17 DIAGNOSIS — D631 Anemia in chronic kidney disease: Secondary | ICD-10-CM | POA: Diagnosis not present

## 2017-02-17 DIAGNOSIS — N186 End stage renal disease: Secondary | ICD-10-CM | POA: Diagnosis not present

## 2017-02-17 DIAGNOSIS — Z8673 Personal history of transient ischemic attack (TIA), and cerebral infarction without residual deficits: Secondary | ICD-10-CM | POA: Insufficient documentation

## 2017-02-17 DIAGNOSIS — I120 Hypertensive chronic kidney disease with stage 5 chronic kidney disease or end stage renal disease: Secondary | ICD-10-CM | POA: Insufficient documentation

## 2017-02-17 DIAGNOSIS — G8918 Other acute postprocedural pain: Secondary | ICD-10-CM | POA: Diagnosis not present

## 2017-02-17 DIAGNOSIS — Z952 Presence of prosthetic heart valve: Secondary | ICD-10-CM | POA: Insufficient documentation

## 2017-02-17 DIAGNOSIS — R1031 Right lower quadrant pain: Secondary | ICD-10-CM | POA: Diagnosis not present

## 2017-02-17 DIAGNOSIS — Z7902 Long term (current) use of antithrombotics/antiplatelets: Secondary | ICD-10-CM | POA: Insufficient documentation

## 2017-02-17 DIAGNOSIS — R079 Chest pain, unspecified: Secondary | ICD-10-CM | POA: Diagnosis not present

## 2017-02-17 DIAGNOSIS — R109 Unspecified abdominal pain: Secondary | ICD-10-CM | POA: Insufficient documentation

## 2017-02-17 DIAGNOSIS — M545 Low back pain: Secondary | ICD-10-CM | POA: Diagnosis not present

## 2017-02-17 DIAGNOSIS — N2581 Secondary hyperparathyroidism of renal origin: Secondary | ICD-10-CM | POA: Diagnosis not present

## 2017-02-17 DIAGNOSIS — R110 Nausea: Secondary | ICD-10-CM | POA: Insufficient documentation

## 2017-02-17 DIAGNOSIS — D509 Iron deficiency anemia, unspecified: Secondary | ICD-10-CM | POA: Diagnosis not present

## 2017-02-17 DIAGNOSIS — Z992 Dependence on renal dialysis: Secondary | ICD-10-CM | POA: Insufficient documentation

## 2017-02-18 ENCOUNTER — Encounter (HOSPITAL_COMMUNITY): Payer: Self-pay | Admitting: *Deleted

## 2017-02-18 ENCOUNTER — Emergency Department (HOSPITAL_COMMUNITY)
Admission: EM | Admit: 2017-02-18 | Discharge: 2017-02-18 | Disposition: A | Discharge status: Home / Self Care | Payer: Medicare Other | Attending: Emergency Medicine | Admitting: Emergency Medicine

## 2017-02-18 ENCOUNTER — Emergency Department (HOSPITAL_COMMUNITY): Payer: Medicare Other

## 2017-02-18 ENCOUNTER — Other Ambulatory Visit: Payer: Self-pay

## 2017-02-18 DIAGNOSIS — R1031 Right lower quadrant pain: Secondary | ICD-10-CM | POA: Diagnosis not present

## 2017-02-18 DIAGNOSIS — R109 Unspecified abdominal pain: Secondary | ICD-10-CM | POA: Diagnosis not present

## 2017-02-18 DIAGNOSIS — R079 Chest pain, unspecified: Secondary | ICD-10-CM | POA: Diagnosis not present

## 2017-02-18 DIAGNOSIS — M545 Low back pain: Secondary | ICD-10-CM | POA: Diagnosis not present

## 2017-02-18 HISTORY — DX: Disorder of kidney and ureter, unspecified: N28.9

## 2017-02-18 LAB — COMPREHENSIVE METABOLIC PANEL
ALBUMIN: 3.7 g/dL (ref 3.5–5.0)
ALT: 17 U/L (ref 14–54)
AST: 31 U/L (ref 15–41)
Alkaline Phosphatase: 388 U/L — ABNORMAL HIGH (ref 38–126)
Anion gap: 13 (ref 5–15)
BUN: 18 mg/dL (ref 6–20)
CHLORIDE: 100 mmol/L — AB (ref 101–111)
CO2: 28 mmol/L (ref 22–32)
CREATININE: 5.59 mg/dL — AB (ref 0.44–1.00)
Calcium: 9.8 mg/dL (ref 8.9–10.3)
GFR calc Af Amer: 9 mL/min — ABNORMAL LOW (ref >60)
GFR, EST NON AFRICAN AMERICAN: 7 mL/min — AB (ref >60)
Glucose, Bld: 103 mg/dL — ABNORMAL HIGH (ref 65–99)
POTASSIUM: 3.3 mmol/L — AB (ref 3.5–5.1)
Sodium: 141 mmol/L (ref 135–145)
Total Bilirubin: 1 mg/dL (ref 0.3–1.2)
Total Protein: 7.7 g/dL (ref 6.5–8.1)

## 2017-02-18 LAB — CBC
HEMATOCRIT: 33.5 % — AB (ref 36.0–46.0)
Hemoglobin: 10.5 g/dL — ABNORMAL LOW (ref 12.0–15.0)
MCH: 27.4 pg (ref 26.0–34.0)
MCHC: 31.3 g/dL (ref 30.0–36.0)
MCV: 87.5 fL (ref 78.0–100.0)
PLATELETS: 314 10*3/uL (ref 150–400)
RBC: 3.83 MIL/uL — ABNORMAL LOW (ref 3.87–5.11)
RDW: 17.9 % — AB (ref 11.5–15.5)
WBC: 6.1 10*3/uL (ref 4.0–10.5)

## 2017-02-18 LAB — LIPASE, BLOOD: LIPASE: 48 U/L (ref 11–51)

## 2017-02-18 MED ORDER — OXYCODONE-ACETAMINOPHEN 5-325 MG PO TABS
2.0000 | ORAL_TABLET | Freq: Once | ORAL | Status: AC
  Administered 2017-02-18: 2 via ORAL
  Filled 2017-02-18: qty 2

## 2017-02-18 MED ORDER — ONDANSETRON HCL 4 MG PO TABS: 4.0000 mg | ORAL_TABLET | Freq: Three times a day (TID) | ORAL | 0 refills | Status: DC | PRN

## 2017-02-18 MED ORDER — DIPHENHYDRAMINE HCL 25 MG PO CAPS: 50.0000 mg | ORAL_CAPSULE | Freq: Once | ORAL | Status: AC

## 2017-02-18 MED ORDER — IOPAMIDOL (ISOVUE-370) INJECTION 76%
INTRAVENOUS | Status: AC
  Administered 2017-02-18: 100 mL
  Filled 2017-02-18: qty 100

## 2017-02-18 MED ORDER — HYDROCORTISONE NA SUCCINATE PF 100 MG IJ SOLR
200.0000 mg | Freq: Once | INTRAMUSCULAR | Status: AC
  Administered 2017-02-18: 200 mg via INTRAVENOUS
  Filled 2017-02-18: qty 4

## 2017-02-18 MED ORDER — POTASSIUM CHLORIDE CRYS ER 20 MEQ PO TBCR
40.0000 meq | EXTENDED_RELEASE_TABLET | Freq: Once | ORAL | Status: AC
  Administered 2017-02-18: 40 meq via ORAL
  Filled 2017-02-18: qty 2

## 2017-02-18 MED ORDER — TRAMADOL HCL 50 MG PO TABS: 50.0000 mg | ORAL_TABLET | Freq: Three times a day (TID) | ORAL | 0 refills | Status: DC | PRN

## 2017-02-18 MED ORDER — DIPHENHYDRAMINE HCL 50 MG/ML IJ SOLN
50.0000 mg | Freq: Once | INTRAMUSCULAR | Status: AC
  Administered 2017-02-18: 50 mg via INTRAVENOUS
  Filled 2017-02-18: qty 1

## 2017-02-18 MED ORDER — HYDROCORTISONE NA SUCCINATE PF 250 MG IJ SOLR: 200.0000 mg | Freq: Once | INTRAMUSCULAR | Status: DC

## 2017-02-18 NOTE — Discharge Instructions (Signed)
Take Zofran as needed for nausea or vomiting. Use Tylenol for mild to moderate pain.  You may take tramadol as needed for severe pain. Follow-up with your primary care doctor in 1 week if your symptoms are not improving. Return to the emergency room if you develop fevers, loss of bowel or bladder control, new numbness, or any new or worsening symptoms.

## 2017-02-18 NOTE — ED Provider Notes (Signed)
Complains of right-sided paralumbar nonradiating pain onset yesterday.  Pain is improved with movement.  She is presently pain-free.  She denies fever nausea or vomiting.  On exam alert nontoxic abdomen soft nontender tender over lumbar spine and right paralumbar area. Patient felt to be low risk for lung cancer and that she is never smoked.  She is immunocompromised in light of possible discitis seen on CT scan will order MRI radiologist suggested MRI lumbar spine without contrast in light of patient's being hemodialysis patient   Orlie Dakin, MD 02/18/17 [phone]

## 2017-02-18 NOTE — ED Provider Notes (Signed)
[facility] EMERGENCY DEPARTMENT Provider Note   CSN: [id number] Arrival date & time: 02/17/17  2353     History   Chief Complaint Chief Complaint  Patient presents with  . Flank Pain    HPI [name] Marks is a 61 y.o. female.  Patient with history of ESRD-HD, GERD, TIA, HTN, CVA, valve replacement, presents with severe right flank pain that started today during dialysis and has been persistent through the day. No fever. The pain will ease up then return without complete resolution at any time. No chest pain, SOB. She has had nausea without vomiting. She reports recent cholecystectomy with similar pain. No RUQ abdominal pain. She does not form urine.   The history is provided by the patient. No language interpreter was used.  Flank Pain  Pertinent negatives include no chest pain, no abdominal pain and no shortness of breath.    Past Medical History:  Diagnosis Date  . Anemia   . Arthritis    "knees" (01/03/2017)  . Carpal tunnel syndrome   . Complication of anesthesia    01/01/17- '"a long time ago, difficulty breathimg, not sure if it was due to anesthesia or not."  . End stage renal disease on dialysis ([place])    "MWF; [place]." (01/03/2017)  . GERD (gastroesophageal reflux disease)   . Gout   . History of blood transfusion 2017   "related to blood poison" (01/03/2017)  . Osteoporosis 09/2016   T score -2.6  . Septic shock ([place])   . Staphylococcus aureus bacteremia   . TIA (transient ischemic attack)    "several" (01/03/2017)    Patient Active Problem List   Diagnosis Date Noted  . S/P cholecystectomy 02/14/2017  . Cholelithiases 01/28/2017  . ESRD (end stage renal disease) ([place]) 01/03/2017  . Osteoporosis 12/11/2016  . Cholelithiasis 11/07/2016  . Cerebral embolism with transient ischemic attack (TIA)   . TIA (transient ischemic attack)   . AVD (aortic valve disease) 07/12/2016  . Pulmonary HTN ([place]) 07/12/2016  . Carpal tunnel  syndrome 07/05/2016  . 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 CVA (cerebral vascular accident) ([place]) 05/16/2016  . History of aortic valve replacement with bioprosthetic valve 05/08/2016  . Aortic valve prosthesis present 02/25/2016  . Bacteremia due to coagulase-negative Staphylococcus   . S/P dialysis catheter insertion ([place])   . Bilateral low back pain with sciatica   . Neck pain   . Endocarditis   . Anemia   . Renal dialysis device, implant, or graft complication 81/02/7508  . Pain and swelling of right upper extremity 12/20/2015  . Complex endometrial hyperplasia with atypia 11/08/2015  . Increased endometrial stripe thickness 11/03/2015  . Obesity 10/28/2014  . Shoulder pain, bilateral 10/28/2014  . Health care maintenance 10/28/2014  . Right carotid bruit 01/08/2013  . Generalized headaches 01/08/2013  . Chronic female pelvic pain 08/08/2012  . Gout, unspecified 12/06/2008  . Esophageal reflux 12/06/2008  . BACK PAIN 12/06/2008  . FIBROIDS, UTERUS 11/24/2008  . ESRD (end stage renal disease) on dialysis ([place]) 07/01/2006    Past Surgical History:  Procedure Laterality Date  . AORTIC VALVE REPLACEMENT N/A 02/25/2016   Procedure: AORTIC VALVE REPLACEMENT (AVR) implanted with Magna Ease Aortic valve size 86mm;  Surgeon: Melrose Nakayama, MD;  Location: [facility];  Service: Open Heart Surgery;  Laterality: N/A;  . AV FISTULA PLACEMENT Left 01/03/2017   Procedure: INSERTION OF ARTERIOVENOUS (AV) GORE-TEX GRAFT LEFT THIGH;  Surgeon: Serafina Mitchell, MD;  [facility];  Service: Vascular;  Laterality: Left;  . [place] REMOVAL Right 02/17/2016   Procedure: REMOVAL OF TWO ARTERIOVENOUS GORETEX GRAFTS ([place]);  Surgeon: Angelia Mould, MD;  Location: [facility];  Service: Vascular;  Laterality: Right;  . BREAST BIOPSY Left 2013   stereo   . BREAST BIOPSY Right 2011   stereo   . CARDIAC VALVE REPLACEMENT    . CHOLECYSTECTOMY N/A 01/30/2017   Procedure: LAPAROSCOPIC  CHOLECYSTECTOMY;  Surgeon: Erroll Luna, MD;  Location: [facility];  Service: General;  Laterality: N/A;  . COLONOSCOPY W/ POLYPECTOMY    . DG AV DIALYSIS GRAFT DECLOT OR    . DILATATION & CURETTAGE/HYSTEROSCOPY WITH TRUECLEAR N/A 11/06/2012   Procedure: DILATATION & CURETTAGE/HYSTEROSCOPY WITH TRUECLEAR;  Surgeon: Terrance Mass, MD;  Location: [facility];  Service: Gynecology;  Laterality: N/A;  Truclear Resectoscopic Polypectomy   . INSERTION OF DIALYSIS CATHETER N/A 02/19/2016   Procedure: INSERTION OF Left Internal Jugular DIALYSIS CATHETER;  Surgeon: Angelia Mould, MD;  Location: [facility];  Service: Vascular;  Laterality: N/A;  . LOOP RECORDER INSERTION N/A 10/01/2016   Procedure: LOOP RECORDER INSERTION;  Surgeon: Constance Haw, MD;  Location: [facility];  Service: Cardiovascular;  Laterality: N/A;  . PATCH ANGIOPLASTY Right 02/17/2016   Procedure: PATCH ANGIOPLASTY;  Surgeon: Angelia Mould, MD;  Location: [facility];  Service: Vascular;  Laterality: Right;  . PERIPHERAL VASCULAR CATHETERIZATION N/A 09/14/2014   Procedure: A/V Shuntogram/Fistulagram;  Surgeon: Katha Cabal, MD;  Location: [facility];  Service: Cardiovascular;  Laterality: N/A;  . PERIPHERAL VASCULAR CATHETERIZATION N/A 09/14/2014   Procedure: A/V Shunt Intervention;  Surgeon: Katha Cabal, MD;  Location: [facility];  Service: Cardiovascular;  Laterality: N/A;  . PERIPHERAL VASCULAR CATHETERIZATION Right 12/09/2014   Procedure: A/V Shuntogram/Fistulagram;  Surgeon: Algernon Huxley, MD;  Location: [facility];  Service: Cardiovascular;  Laterality: Right;  . PERIPHERAL VASCULAR CATHETERIZATION N/A 12/09/2014   Procedure: A/V Shunt Intervention;  Surgeon: Algernon Huxley, MD;  Location: [facility];  Service: Cardiovascular;  Laterality: N/A;  . PERIPHERAL VASCULAR CATHETERIZATION Right 05/24/2015   Procedure: A/V Shuntogram;  Surgeon: Serafina Mitchell, MD;  Location: [facility];  Service: Cardiovascular;  Laterality: Right;  . PERIPHERAL VASCULAR CATHETERIZATION Right 05/24/2015   Procedure: Peripheral Vascular Balloon Angioplasty;  Surgeon: Serafina Mitchell, MD;  Location: [facility];  Service: Cardiovascular;  Laterality: Right;  right arm shunt  . PERIPHERAL VASCULAR CATHETERIZATION N/A 06/13/2015   Procedure: A/V Shuntogram/Fistulagram;  Surgeon: Algernon Huxley, MD;  Location: [facility];  Service: Cardiovascular;  Laterality: N/A;  . PERIPHERAL VASCULAR CATHETERIZATION N/A 06/13/2015   Procedure: A/V Shunt Intervention;  Surgeon: Algernon Huxley, MD;  Location: [facility];  Service: Cardiovascular;  Laterality: N/A;  . TEE WITHOUT CARDIOVERSION N/A 02/22/2016   Procedure: TRANSESOPHAGEAL ECHOCARDIOGRAM (TEE);  Surgeon: Pixie Casino, MD;  Location: [facility];  Service: Cardiovascular;  Laterality: N/A;  . TEE WITHOUT CARDIOVERSION N/A 02/25/2016   Procedure: TRANSESOPHAGEAL ECHOCARDIOGRAM (TEE);  Surgeon: Melrose Nakayama, MD;  Location: [facility];  Service: Open Heart Surgery;  Laterality: N/A;  . TEE WITHOUT CARDIOVERSION N/A 10/01/2016   Procedure: TRANSESOPHAGEAL ECHOCARDIOGRAM (TEE);  Surgeon: Pixie Casino, MD;  Location: [facility] END[facility]COPY;  Service: Cardiovascular;  Laterality: N/A;  . TUBAL LIGATION  1983    OB History    Gravida Para Term Preterm AB Living  2 2       2    SAB TAB Ectopic Multiple Live Births                   Home Medications    Prior to Admission medications   Medication Sig Start Date End Date Taking? Authorizing Provider  cinacalcet (SENSIPAR) 60 MG tablet Take 120 mg by mouth every Monday, Wednesday, and Friday with hemodialysis.     [provider]  clopidogrel (PLAVIX) 75 MG tablet Take 1 tablet (75 mg total) by mouth daily. 01/23/17   Mikell, Jeani Sow, MD  famotidine (PEPCID) 20 MG tablet Take 20 mg by mouth 2 (two) times daily. Lunch and dinner    [provider]    fluticasone (FLONASE) 50 MCG/ACT nasal spray Place 2 sprays into both nostrils daily. 11/24/16   Tereasa Coop, PA-C  ipratropium (ATROVENT) 0.06 % nasal spray Place 2 sprays into both nostrils 4 (four) times daily. Patient taking differently: Place 2 sprays into both nostrils 3 (three) times daily as needed for rhinitis.  11/24/16   Tereasa Coop, PA-C  midodrine (PROAMATINE) 10 MG tablet Take 1 tablet (10 mg total) by mouth 2 (two) times daily as needed (30 min prior to HD). 08/27/16   Mikell, Jeani Sow, MD  Multiple Vitamins-Minerals (PRORENAL QD PO) Take 1 tablet by mouth daily.     [provider]  oxyCODONE (ROXICODONE) 5 MG immediate release tablet Take 0.5 tablets (2.5 mg total) by mouth every 6 (six) hours as needed for severe pain. Patient not taking: Reported on 01/28/2017 10/28/16   Fatima Blank, MD  oxyCODONE (ROXICODONE) 5 MG immediate release tablet Take 0.5-1 tablets (2.5-5 mg total) every 4 (four) hours as needed by mouth for severe pain. Patient not taking: Reported on 01/28/2017 01/04/17   Ulyses Amor, PA-C  polyethylene glycol [facility]ia Raveling) packet Take 17 g by mouth daily as needed for mild constipation. 02/01/17   Caroline More, DO  sevelamer (RENVELA) 800 MG tablet Take 800 mg by mouth 3 (three) times daily with meals.     [provider]    Family History Family History  Problem Relation Age of Onset  . Diabetes Mother   . Hypertension Mother   . Heart disease Mother   . Alcohol abuse Mother   . Kidney disease Mother   . Cancer Father        COLON  . Alcohol abuse Father   . Breast cancer Sister 41  . Hypertension Sister   . Kidney disease Sister   . Hypertension Son   . Kidney disease Son     Social History Social History   Tobacco Use  . Smoking status: Never Smoker  . Smokeless tobacco: Never Used  Substance Use Topics  . Alcohol use: No  . Drug use: No     Allergies   Contrast media [iodinated diagnostic  agents]; Ancef [cefazolin]; and Naproxen sodium   Review of Systems Review of Systems  Constitutional: Negative for chills and fever.  Respiratory: Negative.  Negative for cough and shortness of breath.   Cardiovascular: Negative.  Negative for chest pain.  Gastrointestinal: Positive for nausea. Negative for abdominal pain and vomiting.  Genitourinary: Positive for flank pain.  Skin: Negative.   Neurological: Negative.  Negative for light-headedness.     Physical Exam Updated Vital Signs BP 91/74 (BP Location: Right Arm)   Pulse 76   Temp 97.6 F (36.4 C) (Oral)  Resp 18   SpO2 94%   Physical Exam  Constitutional: She is oriented to person, place, and time. She appears well-developed and well-nourished.  Pacing, uncomfortable appearing.  HENT:  Head: Normocephalic.  Neck: Normal range of motion. Neck supple.  Cardiovascular: Normal rate and regular rhythm.  Pulmonary/Chest: Effort normal and breath sounds normal. She has no wheezes. She has no rales.  Abdominal: Soft. Bowel sounds are normal. There is no tenderness. There is no rebound and no guarding.  Genitourinary:  Genitourinary Comments: Right flank tenderness.   Musculoskeletal: Normal range of motion.  Neurological: She is alert and oriented to person, place, and time.  Skin: Skin is warm and dry. No rash noted.  Psychiatric: She has a normal mood and affect.     ED Treatments / Results  Labs (all labs ordered are listed, but only abnormal results are displayed) Labs Reviewed  COMPREHENSIVE METABOLIC PANEL - Abnormal; Notable for the following components:      Result Value   Potassium 3.3 (*)    Chloride 100 (*)    Glucose, Bld 103 (*)    Creatinine, Ser 5.59 (*)    Alkaline Phosphatase 388 (*)    GFR calc non Af Amer 7 (*)    GFR calc Af Amer 9 (*)    All other components within normal limits  CBC - Abnormal; Notable for the following components:   RBC 3.83 (*)    Hemoglobin 10.5 (*)    HCT 33.5 (*)     RDW 17.9 (*)    All other components within normal limits  LIPASE, BLOOD    EKG  EKG Interpretation None       Radiology No results found.  Procedures Procedures (including critical care time)  Medications Ordered in ED Medications  oxyCODONE-acetaminophen (PERCOCET/ROXICET) 5-325 MG per tablet 2 tablet (not administered)     Initial Impression / Assessment and Plan / ED Course  I have reviewed the triage vital signs and the nursing notes.  Pertinent labs & imaging results that were available during my care of the patient were reviewed by me and considered in my medical decision making (see chart for details).     Patient presents with right flank pain for the past 8-10 hours. No fever. H/O ESRD-HD, does not form urine, no history of kidney stones. Recent cholecystectomy.   Patient's pain is controlled with Percocet. She is resting comfortably. CT scan felt appropriate for evaluation, however, with a CM allergy will have to premedicate with Hydrocortisone and Benadryl. Patient updated on plan and agrees.   Patient care signed out to [facility], PA-C, pending CT scan. Anticipate discharge home as pain has been well controlled.  Final Clinical Impressions(s) / ED Diagnoses   Final diagnoses:  None   1. Right flank pain  ED Discharge Orders    None       Charlann Lange, Hershal Coria 02/18/17 4431    Ward, Delice Bison, DO 02/18/17 [phone]

## 2017-02-18 NOTE — ED Triage Notes (Signed)
The pt is c/o rt flank pain all day today  Dialysis pt that was dialyzed today fistula in her lt upper leg

## 2017-02-18 NOTE — ED Notes (Signed)
Patient transported to MRI 

## 2017-02-18 NOTE — ED Notes (Addendum)
Pt reports right sided flank pain that radiates around and across her back. Pt reports the pain started at 0700 and she did not finish dialysis because of same. Pt states her grand kids came over so she stayed at home until her son came home which is why she is coming in late in the evening.

## 2017-02-18 NOTE — ED Notes (Signed)
Patient transported to CT 

## 2017-02-18 NOTE — ED Notes (Signed)
Pt denies claustraphobia, or pain at this moment.

## 2017-02-18 NOTE — ED Provider Notes (Signed)
Pt signed out to me by S. Upstill, PA-C. Please see previous notes for further straight.  In brief, patient with history of ESRD on dialysis presented with right flank pain and nausea without vomiting.  She was afebrile.  She has no history of kidney stones and does not form urine.  She had a lap chole on December 5 without complication.  Concern for possible complication from surgery or dissection.  CTA pending.  To Percocet given for pain, which completely resolved pain.  CTA shows concern for discitis at L4-L5.  On reassessment, patient with tenderness palpation of midline spine.  Case discussed with attending, Dr. Winfred Leeds evaluated the patient.  Will obtain MR without contrast for further evaluation of possible discitis.  Patient remains afebrile and in no pain.  Additionally, CTA showed nodule in the lungs, patient considered low risk and does not need follow-up on this at this time.  MRI shows advancing arthritis and stenosis without signs of discitis at this time.  Discussed findings with patient.  Will discharge with prescription for Zofran for nausea and Tylenol and tramadol for pain.  Patient to follow-up with her primary care doctor for further evaluation of pain.  At this time, patient appears to be discharge.  Return precautions given.  Patient states she understands and agrees to plan.   Franchot Heidelberg, PA-C 02/18/17 1339    Orlie Dakin, MD 02/18/17 1527

## 2017-02-19 NOTE — Assessment & Plan Note (Signed)
Patient doing well status post cholecystectomy.  No issues with eating or drinking.  No nausea, vomiting, fevers, chills.  Surgical sites have healed well -No further follow-up for this issue needed -Patient to follow-up with me in 6 months for general checkup.

## 2017-02-20 DIAGNOSIS — N186 End stage renal disease: Secondary | ICD-10-CM | POA: Diagnosis not present

## 2017-02-20 DIAGNOSIS — N2581 Secondary hyperparathyroidism of renal origin: Secondary | ICD-10-CM | POA: Diagnosis not present

## 2017-02-20 DIAGNOSIS — D631 Anemia in chronic kidney disease: Secondary | ICD-10-CM | POA: Diagnosis not present

## 2017-02-20 DIAGNOSIS — D509 Iron deficiency anemia, unspecified: Secondary | ICD-10-CM | POA: Diagnosis not present

## 2017-02-21 DIAGNOSIS — I272 Pulmonary hypertension, unspecified: Secondary | ICD-10-CM | POA: Diagnosis not present

## 2017-02-21 DIAGNOSIS — M810 Age-related osteoporosis without current pathological fracture: Secondary | ICD-10-CM | POA: Diagnosis not present

## 2017-02-21 DIAGNOSIS — N186 End stage renal disease: Secondary | ICD-10-CM | POA: Diagnosis not present

## 2017-02-21 DIAGNOSIS — Z48815 Encounter for surgical aftercare following surgery on the digestive system: Secondary | ICD-10-CM | POA: Diagnosis not present

## 2017-02-21 DIAGNOSIS — D631 Anemia in chronic kidney disease: Secondary | ICD-10-CM | POA: Diagnosis not present

## 2017-02-21 DIAGNOSIS — K219 Gastro-esophageal reflux disease without esophagitis: Secondary | ICD-10-CM | POA: Diagnosis not present

## 2017-02-22 DIAGNOSIS — D509 Iron deficiency anemia, unspecified: Secondary | ICD-10-CM | POA: Diagnosis not present

## 2017-02-22 DIAGNOSIS — N186 End stage renal disease: Secondary | ICD-10-CM | POA: Diagnosis not present

## 2017-02-22 DIAGNOSIS — D631 Anemia in chronic kidney disease: Secondary | ICD-10-CM | POA: Diagnosis not present

## 2017-02-22 DIAGNOSIS — N2581 Secondary hyperparathyroidism of renal origin: Secondary | ICD-10-CM | POA: Diagnosis not present

## 2017-02-24 DIAGNOSIS — D631 Anemia in chronic kidney disease: Secondary | ICD-10-CM | POA: Diagnosis not present

## 2017-02-24 DIAGNOSIS — N2581 Secondary hyperparathyroidism of renal origin: Secondary | ICD-10-CM | POA: Diagnosis not present

## 2017-02-24 DIAGNOSIS — N186 End stage renal disease: Secondary | ICD-10-CM | POA: Diagnosis not present

## 2017-02-24 DIAGNOSIS — D509 Iron deficiency anemia, unspecified: Secondary | ICD-10-CM | POA: Diagnosis not present

## 2017-02-25 DIAGNOSIS — Z992 Dependence on renal dialysis: Secondary | ICD-10-CM | POA: Diagnosis not present

## 2017-02-25 DIAGNOSIS — N186 End stage renal disease: Secondary | ICD-10-CM | POA: Diagnosis not present

## 2017-02-25 DIAGNOSIS — N039 Chronic nephritic syndrome with unspecified morphologic changes: Secondary | ICD-10-CM | POA: Diagnosis not present

## 2017-02-27 DIAGNOSIS — N2581 Secondary hyperparathyroidism of renal origin: Secondary | ICD-10-CM | POA: Diagnosis not present

## 2017-02-27 DIAGNOSIS — D509 Iron deficiency anemia, unspecified: Secondary | ICD-10-CM | POA: Diagnosis not present

## 2017-02-27 DIAGNOSIS — N186 End stage renal disease: Secondary | ICD-10-CM | POA: Diagnosis not present

## 2017-02-27 DIAGNOSIS — D631 Anemia in chronic kidney disease: Secondary | ICD-10-CM | POA: Diagnosis not present

## 2017-02-28 ENCOUNTER — Ambulatory Visit (INDEPENDENT_AMBULATORY_CARE_PROVIDER_SITE_OTHER): Payer: Medicare Other | Admitting: *Deleted

## 2017-02-28 DIAGNOSIS — K219 Gastro-esophageal reflux disease without esophagitis: Secondary | ICD-10-CM | POA: Diagnosis not present

## 2017-02-28 DIAGNOSIS — I272 Pulmonary hypertension, unspecified: Secondary | ICD-10-CM | POA: Diagnosis not present

## 2017-02-28 DIAGNOSIS — N186 End stage renal disease: Secondary | ICD-10-CM | POA: Diagnosis not present

## 2017-02-28 DIAGNOSIS — Z48815 Encounter for surgical aftercare following surgery on the digestive system: Secondary | ICD-10-CM | POA: Diagnosis not present

## 2017-02-28 DIAGNOSIS — M810 Age-related osteoporosis without current pathological fracture: Secondary | ICD-10-CM | POA: Diagnosis not present

## 2017-02-28 DIAGNOSIS — I639 Cerebral infarction, unspecified: Secondary | ICD-10-CM | POA: Diagnosis not present

## 2017-02-28 DIAGNOSIS — D631 Anemia in chronic kidney disease: Secondary | ICD-10-CM | POA: Diagnosis not present

## 2017-03-01 DIAGNOSIS — D509 Iron deficiency anemia, unspecified: Secondary | ICD-10-CM | POA: Diagnosis not present

## 2017-03-01 DIAGNOSIS — D631 Anemia in chronic kidney disease: Secondary | ICD-10-CM | POA: Diagnosis not present

## 2017-03-01 DIAGNOSIS — N186 End stage renal disease: Secondary | ICD-10-CM | POA: Diagnosis not present

## 2017-03-01 DIAGNOSIS — N2581 Secondary hyperparathyroidism of renal origin: Secondary | ICD-10-CM | POA: Diagnosis not present

## 2017-03-01 NOTE — Progress Notes (Signed)
Carelink Summary Report / Loop Recorder 

## 2017-03-04 DIAGNOSIS — N186 End stage renal disease: Secondary | ICD-10-CM | POA: Diagnosis not present

## 2017-03-04 DIAGNOSIS — N2581 Secondary hyperparathyroidism of renal origin: Secondary | ICD-10-CM | POA: Diagnosis not present

## 2017-03-04 DIAGNOSIS — D631 Anemia in chronic kidney disease: Secondary | ICD-10-CM | POA: Diagnosis not present

## 2017-03-04 DIAGNOSIS — D509 Iron deficiency anemia, unspecified: Secondary | ICD-10-CM | POA: Diagnosis not present

## 2017-03-05 DIAGNOSIS — I272 Pulmonary hypertension, unspecified: Secondary | ICD-10-CM | POA: Diagnosis not present

## 2017-03-05 DIAGNOSIS — K219 Gastro-esophageal reflux disease without esophagitis: Secondary | ICD-10-CM | POA: Diagnosis not present

## 2017-03-05 DIAGNOSIS — Z48815 Encounter for surgical aftercare following surgery on the digestive system: Secondary | ICD-10-CM | POA: Diagnosis not present

## 2017-03-05 DIAGNOSIS — M810 Age-related osteoporosis without current pathological fracture: Secondary | ICD-10-CM | POA: Diagnosis not present

## 2017-03-05 DIAGNOSIS — N186 End stage renal disease: Secondary | ICD-10-CM | POA: Diagnosis not present

## 2017-03-05 DIAGNOSIS — D631 Anemia in chronic kidney disease: Secondary | ICD-10-CM | POA: Diagnosis not present

## 2017-03-06 DIAGNOSIS — N2581 Secondary hyperparathyroidism of renal origin: Secondary | ICD-10-CM | POA: Diagnosis not present

## 2017-03-06 DIAGNOSIS — N186 End stage renal disease: Secondary | ICD-10-CM | POA: Diagnosis not present

## 2017-03-06 DIAGNOSIS — D509 Iron deficiency anemia, unspecified: Secondary | ICD-10-CM | POA: Diagnosis not present

## 2017-03-06 DIAGNOSIS — D631 Anemia in chronic kidney disease: Secondary | ICD-10-CM | POA: Diagnosis not present

## 2017-03-07 DIAGNOSIS — Z452 Encounter for adjustment and management of vascular access device: Secondary | ICD-10-CM | POA: Diagnosis not present

## 2017-03-08 DIAGNOSIS — N2581 Secondary hyperparathyroidism of renal origin: Secondary | ICD-10-CM | POA: Diagnosis not present

## 2017-03-08 DIAGNOSIS — D509 Iron deficiency anemia, unspecified: Secondary | ICD-10-CM | POA: Diagnosis not present

## 2017-03-08 DIAGNOSIS — D631 Anemia in chronic kidney disease: Secondary | ICD-10-CM | POA: Diagnosis not present

## 2017-03-08 DIAGNOSIS — N186 End stage renal disease: Secondary | ICD-10-CM | POA: Diagnosis not present

## 2017-03-11 DIAGNOSIS — D509 Iron deficiency anemia, unspecified: Secondary | ICD-10-CM | POA: Diagnosis not present

## 2017-03-11 DIAGNOSIS — D631 Anemia in chronic kidney disease: Secondary | ICD-10-CM | POA: Diagnosis not present

## 2017-03-11 DIAGNOSIS — N2581 Secondary hyperparathyroidism of renal origin: Secondary | ICD-10-CM | POA: Diagnosis not present

## 2017-03-11 DIAGNOSIS — N186 End stage renal disease: Secondary | ICD-10-CM | POA: Diagnosis not present

## 2017-03-13 DIAGNOSIS — N186 End stage renal disease: Secondary | ICD-10-CM | POA: Diagnosis not present

## 2017-03-13 DIAGNOSIS — D631 Anemia in chronic kidney disease: Secondary | ICD-10-CM | POA: Diagnosis not present

## 2017-03-13 DIAGNOSIS — N2581 Secondary hyperparathyroidism of renal origin: Secondary | ICD-10-CM | POA: Diagnosis not present

## 2017-03-13 DIAGNOSIS — D509 Iron deficiency anemia, unspecified: Secondary | ICD-10-CM | POA: Diagnosis not present

## 2017-03-13 LAB — CUP PACEART REMOTE DEVICE CHECK
Date Time Interrogation Session: 20190103214114
MDC IDC PG IMPLANT DT: 20180806

## 2017-03-14 ENCOUNTER — Ambulatory Visit (INDEPENDENT_AMBULATORY_CARE_PROVIDER_SITE_OTHER): Payer: Medicare Other | Admitting: Internal Medicine

## 2017-03-14 ENCOUNTER — Encounter: Payer: Self-pay | Admitting: Internal Medicine

## 2017-03-14 VITALS — BP 90/70 | HR 78 | Temp 98.1°F | Wt 161.4 lb

## 2017-03-14 DIAGNOSIS — I639 Cerebral infarction, unspecified: Secondary | ICD-10-CM | POA: Diagnosis not present

## 2017-03-14 DIAGNOSIS — M545 Low back pain, unspecified: Secondary | ICD-10-CM

## 2017-03-14 MED ORDER — TRAMADOL HCL 50 MG PO TABS: 50.0000 mg | ORAL_TABLET | Freq: Two times a day (BID) | ORAL | 0 refills | Status: DC | PRN

## 2017-03-14 NOTE — Patient Instructions (Signed)
Please follow up with physical therapy and see if you have any improvement in your back pain. Use tramadol as needed, especially if you are going to dialysis. If you need a referral to orthopedics just give the clinic a call

## 2017-03-14 NOTE — Progress Notes (Signed)
   [name] Kerrin Mo, MD Phone: [phone]  Reason For Visit: SDA for Back Pain   # Patient presenting with flank pain, goes towards the back.  Has been going on for the last couple of weeks.  Patient states that bending forward tends to relieve the pain, extentions tends to make it worse. Initially evaluated in the ED where patient had a CT and MRI obtained  History of trauma or injury: none  Prior history of similar pain: None  History of cancer: None  Weak immune system:  ESRD  History of IV drug use: None  History of steroid use: None   Symptoms Incontinence of bowel or bladder: None  Numbness of leg: None  Fever: None  Rest or Night pain: None  Weight Loss:  None   Past Medical History Reviewed problem list.  Medications- reviewed and updated No additions to family history Social history- patient is a non-smoker  Objective: BP 90/70   Pulse 78   Temp 98.1 F (36.7 C) (Oral)   Wt 161 lb 6.4 oz (73.2 kg)   SpO2 99%   BMI 31.52 kg/m  Gen: NAD, alert, cooperative with exam Cardio: regular rate and rhythm, S1S2 heard, no murmurs appreciated Pulm: clear to auscultation bilaterally, no wheezes, rhonchi or rales Extremities: No abnormal was noted on inspection,Slight  tenderness along the lumbar and thoracic area with paraspinal muscle tenderness, no pain with flexion however pain with extension, 5/5 of strength, neurovascularly intact Skin: dry, intact, no rashes or lesions Neuro: Strength and sensation grossly intact   Assessment/Plan: See problem based a/p  Backache MRI and CT obtained in the ED notable for significant degenerative joint disease, spinal stenosis's facet arthritis likely causing patient's symptoms of back pain with radiation towards the front of the abdomen - traMADol (ULTRAM) 50 MG tablet; Take 1 tablet (50 mg total) by mouth every 12 (twelve) hours as needed.  Dispense: 30 tablet; Refill: 0 - Ambulatory referral to  Physical Therapy - Follow up in 1 month if no improvement

## 2017-03-15 DIAGNOSIS — D509 Iron deficiency anemia, unspecified: Secondary | ICD-10-CM | POA: Diagnosis not present

## 2017-03-15 DIAGNOSIS — N2581 Secondary hyperparathyroidism of renal origin: Secondary | ICD-10-CM | POA: Diagnosis not present

## 2017-03-15 DIAGNOSIS — N186 End stage renal disease: Secondary | ICD-10-CM | POA: Diagnosis not present

## 2017-03-15 DIAGNOSIS — D631 Anemia in chronic kidney disease: Secondary | ICD-10-CM | POA: Diagnosis not present

## 2017-03-15 NOTE — Assessment & Plan Note (Addendum)
MRI and CT obtained in the ED notable for significant degenerative joint disease, spinal stenosis's facet arthritis likely causing patient's symptoms of back pain with radiation towards the front of the abdomen - traMADol (ULTRAM) 50 MG tablet; Take 1 tablet (50 mg total) by mouth every 12 (twelve) hours as needed.  Dispense: 30 tablet; Refill: 0 - Ambulatory referral to Physical Therapy - Follow up in 1 month if no improvement  -Can give me a call if you would like a referral to orthopedics after couple weeks

## 2017-03-18 DIAGNOSIS — N2581 Secondary hyperparathyroidism of renal origin: Secondary | ICD-10-CM | POA: Diagnosis not present

## 2017-03-18 DIAGNOSIS — D631 Anemia in chronic kidney disease: Secondary | ICD-10-CM | POA: Diagnosis not present

## 2017-03-18 DIAGNOSIS — N186 End stage renal disease: Secondary | ICD-10-CM | POA: Diagnosis not present

## 2017-03-18 DIAGNOSIS — D509 Iron deficiency anemia, unspecified: Secondary | ICD-10-CM | POA: Diagnosis not present

## 2017-03-20 DIAGNOSIS — D631 Anemia in chronic kidney disease: Secondary | ICD-10-CM | POA: Diagnosis not present

## 2017-03-20 DIAGNOSIS — D509 Iron deficiency anemia, unspecified: Secondary | ICD-10-CM | POA: Diagnosis not present

## 2017-03-20 DIAGNOSIS — N186 End stage renal disease: Secondary | ICD-10-CM | POA: Diagnosis not present

## 2017-03-20 DIAGNOSIS — N2581 Secondary hyperparathyroidism of renal origin: Secondary | ICD-10-CM | POA: Diagnosis not present

## 2017-03-22 DIAGNOSIS — N2581 Secondary hyperparathyroidism of renal origin: Secondary | ICD-10-CM | POA: Diagnosis not present

## 2017-03-22 DIAGNOSIS — N186 End stage renal disease: Secondary | ICD-10-CM | POA: Diagnosis not present

## 2017-03-22 DIAGNOSIS — D509 Iron deficiency anemia, unspecified: Secondary | ICD-10-CM | POA: Diagnosis not present

## 2017-03-22 DIAGNOSIS — D631 Anemia in chronic kidney disease: Secondary | ICD-10-CM | POA: Diagnosis not present

## 2017-03-25 DIAGNOSIS — N2581 Secondary hyperparathyroidism of renal origin: Secondary | ICD-10-CM | POA: Diagnosis not present

## 2017-03-25 DIAGNOSIS — N186 End stage renal disease: Secondary | ICD-10-CM | POA: Diagnosis not present

## 2017-03-25 DIAGNOSIS — D509 Iron deficiency anemia, unspecified: Secondary | ICD-10-CM | POA: Diagnosis not present

## 2017-03-25 DIAGNOSIS — D631 Anemia in chronic kidney disease: Secondary | ICD-10-CM | POA: Diagnosis not present

## 2017-03-26 ENCOUNTER — Other Ambulatory Visit: Payer: Self-pay

## 2017-03-26 ENCOUNTER — Ambulatory Visit: Payer: Medicare Other | Attending: Family Medicine

## 2017-03-26 DIAGNOSIS — M545 Low back pain, unspecified: Secondary | ICD-10-CM

## 2017-03-26 DIAGNOSIS — R293 Abnormal posture: Secondary | ICD-10-CM | POA: Diagnosis not present

## 2017-03-26 DIAGNOSIS — G8929 Other chronic pain: Secondary | ICD-10-CM | POA: Insufficient documentation

## 2017-03-26 DIAGNOSIS — M62830 Muscle spasm of back: Secondary | ICD-10-CM | POA: Insufficient documentation

## 2017-03-26 NOTE — Patient Instructions (Signed)
Single knee to chest  X 2-3 reps 2x/day, PPT  2x/day x 10-15,  Bridge 2x/day 10-15 reps ,,,, side leg lifts  2x/day 10-15 reps RT and LT

## 2017-03-26 NOTE — Therapy (Addendum)
[place], [place], [address] Phone: [phone]   Fax:  [phone]  Physical Therapy Evaluation  Patient Details  Name: [name] Marks MRN: [id number] Date of Birth: 06/01/1955 Referring Provider: Madison Hickman, MD   Encounter Date: 03/26/2017  PT End of Session - 03/26/17 1200    Visit Number  1    Number of Visits  8    Date for PT Re-Evaluation  04/19/17    PT Start Time  1696    PT Stop Time  1230    PT Time Calculation (min)  45 min    Activity Tolerance  Patient tolerated treatment well;No increased pain    Behavior During Therapy  WFL for tasks assessed/performed       Past Medical History:  Diagnosis Date  . Anemia   . Arthritis    "knees" (01/03/2017)  . Carpal tunnel syndrome   . Complication of anesthesia    01/01/17- '"a long time ago, difficulty breathimg, not sure if it was due to anesthesia or not."  . End stage renal disease on dialysis ([place])    "MWF; [place]." (01/03/2017)  . GERD (gastroesophageal reflux disease)   . Gout   . History of blood transfusion 2017   "related to blood poison" (01/03/2017)  . Osteoporosis 09/2016   T score -2.6  . Renal insufficiency   . Septic shock ([place])   . Staphylococcus aureus bacteremia   . TIA (transient ischemic attack)    "several" (01/03/2017)    Past Surgical History:  Procedure Laterality Date  . AORTIC VALVE REPLACEMENT N/A 02/25/2016   Procedure: AORTIC VALVE REPLACEMENT (AVR) implanted with Magna Ease Aortic valve size 72mm;  Surgeon: Melrose Nakayama, MD;  Location: [facility];  Service: Open Heart Surgery;  Laterality: N/A;  . AV FISTULA PLACEMENT Left 01/03/2017   Procedure: INSERTION OF ARTERIOVENOUS (AV) GORE-TEX GRAFT LEFT THIGH;  Surgeon: Serafina Mitchell, MD;  Location: [facility];  Service: Vascular;  Laterality: Left;  . [place] REMOVAL Right 02/17/2016   Procedure: REMOVAL OF TWO ARTERIOVENOUS GORETEX GRAFTS ([place]);   Surgeon: Angelia Mould, MD;  Location: [facility];  Service: Vascular;  Laterality: Right;  . BREAST BIOPSY Left 2013   stereo   . BREAST BIOPSY Right 2011   stereo   . CARDIAC VALVE REPLACEMENT    . CHOLECYSTECTOMY N/A 01/30/2017   Procedure: LAPAROSCOPIC CHOLECYSTECTOMY;  Surgeon: Erroll Luna, MD;  Location: [facility];  Service: General;  Laterality: N/A;  . COLONOSCOPY W/ POLYPECTOMY    . DG AV DIALYSIS GRAFT DECLOT OR    . DILATATION & CURETTAGE/HYSTEROSCOPY WITH TRUECLEAR N/A 11/06/2012   Procedure: DILATATION & CURETTAGE/HYSTEROSCOPY WITH TRUECLEAR;  Surgeon: Terrance Mass, MD;  Location: [facility];  Service: Gynecology;  Laterality: N/A;  Truclear Resectoscopic Polypectomy   . INSERTION OF DIALYSIS CATHETER N/A 02/19/2016   Procedure: INSERTION OF Left Internal Jugular DIALYSIS CATHETER;  Surgeon: Angelia Mould, MD;  Location: [facility];  Service: Vascular;  Laterality: N/A;  . LOOP RECORDER INSERTION N/A 10/01/2016   Procedure: LOOP RECORDER INSERTION;  Surgeon: Constance Haw, MD;  Location: [facility];  Service: Cardiovascular;  Laterality: N/A;  . PATCH ANGIOPLASTY Right 02/17/2016   Procedure: PATCH ANGIOPLASTY;  Surgeon: Angelia Mould, MD;  Location: [facility];  Service: Vascular;  Laterality: Right;  . PERIPHERAL VASCULAR CATHETERIZATION N/A 09/14/2014   Procedure: A/V Shuntogram/Fistulagram;  Surgeon: Katha Cabal, MD;  Location: [facility];  Service: Cardiovascular;  Laterality: N/A;  . PERIPHERAL VASCULAR CATHETERIZATION N/A 09/14/2014   Procedure: A/V Shunt Intervention;  Surgeon: Katha Cabal, MD;  Location: [facility];  Service: Cardiovascular;  Laterality: N/A;  . PERIPHERAL VASCULAR CATHETERIZATION Right 12/09/2014   Procedure: A/V Shuntogram/Fistulagram;  Surgeon: Algernon Huxley, MD;  Location: [facility];  Service: Cardiovascular;  Laterality: Right;  . PERIPHERAL VASCULAR CATHETERIZATION N/A 12/09/2014    Procedure: A/V Shunt Intervention;  Surgeon: Algernon Huxley, MD;  Location: [facility];  Service: Cardiovascular;  Laterality: N/A;  . PERIPHERAL VASCULAR CATHETERIZATION Right 05/24/2015   Procedure: A/V Shuntogram;  Surgeon: Serafina Mitchell, MD;  Location: [facility];  Service: Cardiovascular;  Laterality: Right;  . PERIPHERAL VASCULAR CATHETERIZATION Right 05/24/2015   Procedure: Peripheral Vascular Balloon Angioplasty;  Surgeon: Serafina Mitchell, MD;  Location: [facility];  Service: Cardiovascular;  Laterality: Right;  right arm shunt  . PERIPHERAL VASCULAR CATHETERIZATION N/A 06/13/2015   Procedure: A/V Shuntogram/Fistulagram;  Surgeon: Algernon Huxley, MD;  Location: [facility];  Service: Cardiovascular;  Laterality: N/A;  . PERIPHERAL VASCULAR CATHETERIZATION N/A 06/13/2015   Procedure: A/V Shunt Intervention;  Surgeon: Algernon Huxley, MD;  Location: [facility];  Service: Cardiovascular;  Laterality: N/A;  . TEE WITHOUT CARDIOVERSION N/A 02/22/2016   Procedure: TRANSESOPHAGEAL ECHOCARDIOGRAM (TEE);  Surgeon: Pixie Casino, MD;  Location: [facility];  Service: Cardiovascular;  Laterality: N/A;  . TEE WITHOUT CARDIOVERSION N/A 02/25/2016   Procedure: TRANSESOPHAGEAL ECHOCARDIOGRAM (TEE);  Surgeon: Melrose Nakayama, MD;  Location: [facility];  Service: Open Heart Surgery;  Laterality: N/A;  . TEE WITHOUT CARDIOVERSION N/A 10/01/2016   Procedure: TRANSESOPHAGEAL ECHOCARDIOGRAM (TEE);  Surgeon: Pixie Casino, MD;  Location: [facility]COPY;  Service: Cardiovascular;  Laterality: N/A;  . TUBAL LIGATION  1983    There were no vitals filed for this visit.   Subjective Assessment - 03/26/17 1152    Subjective  She reports Oa in spine with back pain.    She has back pain starting and had removal of gall baldder which continued with back pain 11/2016.  No injury.    takes meds so she can sit for dyalisis so may be why no pain for now.Feels better sitting down     Pertinent History  2017 cardiac surgery,  blood infection,  gall bladder remova;l, dialysis     Limitations  House hold activities sleeping,  lifting 10-20 pounds    Diagnostic tests  CT scan    Currently in Pain?  No/denies last pain 2 weeks ago    Pain Location  Back    Pain Orientation  Lower;Right    Pain Descriptors / Indicators  Dull;Sharp    Pain Type  Chronic pain    Pain Onset  More than a month ago    Pain Frequency  Intermittent    Aggravating Factors   lifting ,     Pain Relieving Factors  meds          [facility] PT Assessment - 03/26/17 0001      Assessment   Medical Diagnosis  back pain    Referring Provider  Madison Hickman, MD    Onset Date/Surgical Date  -- 11/2016    Next MD Visit  No    Prior Therapy  No      Precautions   Precautions  None      Restrictions   Weight  Bearing Restrictions  No      Balance Screen   Has the patient fallen in the past 6 months  No    Has the patient had a decrease in activity level because of a fear of falling?   No    Is the patient reluctant to leave their home because of a fear of falling?   No      Prior Function   Level of Independence  Independent      Cognition   Overall Cognitive Status  Within Functional Limits for tasks assessed      Observation/Other Assessments   Focus on Therapeutic Outcomes (FOTO)   50% limited      Posture/Postural Control   Posture Comments  flexed knees,, RT more than Lt    flate lower back       ROM / Strength   AROM / PROM / Strength  AROM;Strength      AROM   AROM Assessment Site  Lumbar    Lumbar Flexion  80    Lumbar Extension  10    Lumbar - Right Side Bend  10    Lumbar - Left Side Bend  10      Strength   Overall Strength Comments  LE WNL i  knees/ankles ,   hips 4-/5 to 3+/5      Flexibility   Soft Tissue Assessment /Muscle Length  yes    Hamstrings  RT 45 degrees  LT 5 degrees      Ambulation/Gait   Gait Comments  uses SPC intermittantly due to due pain, she also  reports some instability on feet.              Objective measurements completed on examination: See above findings.              PT Education - 03/26/17 1200    Education provided  Yes    Education Details  POC , meaning of stenosis, HEP     Person(s) Educated  Patient    Methods  Explanation    Comprehension  Verbalized understanding          PT Long Term Goals - 03/26/17 1225      PT LONG TERM GOAL #1   Title  She will be independent with all HEP issued.     Time  4    Period  Weeks    Status  New      PT LONG TERM GOAL #2   Title  She will report pain eased 50% with lifting and home tasks.     Time  4    Period  Weeks    Status  New      PT LONG TERM GOAL #3   Title  she will report no sleep disturbance due to back pain.     Time  4    Period  Weeks    Status  New      PT LONG TERM GOAL #4   Title  She will verbalize and demo understanding of good body mechanics    Time  4    Period  Weeks    Status  New      PT LONG TERM GOAL #5   Title  FOTO score improved to 40% limited or less    Time  4    Period  Weeks    Status  New             Plan -  03/26/17 1235    Clinical Impression Statement  Ms Marks reports pain staring 11/2016 but is now with intermittant pain with heavier lifting and home tasks. She is taking pain meds regularly to make it trhough dialysis but has not had pain in about 2 weeks. She would benefit from core strenght and stretching    Clinical Presentation  Stable    Clinical Decision Making  Low    Rehab Potential  Good    PT Frequency  2x / week    PT Duration  4 weeks    PT Treatment/Interventions  Therapeutic exercise;Patient/family education;Manual techniques;Moist Heat    PT Next Visit Plan  REview HEp and add to HEP    PT Home Exercise Plan  PPT , knee to chest,  LTR, bridge,     Consulted and Agree with Plan of Care  Patient       Patient will benefit from skilled therapeutic intervention in order to  improve the following deficits and impairments:  Difficulty walking, Obesity, Decreased strength, Decreased range of motion, Postural dysfunction, Pain  Visit Diagnosis: Chronic right-sided low back pain without sciatica - Plan: PT plan of care cert/re-cert  Muscle spasm of back - Plan: PT plan of care cert/re-cert  Abnormal posture - Plan: PT plan of care cert/re-cert     Problem List Patient Active Problem List   Diagnosis Date Noted  . S/P cholecystectomy 02/14/2017  . Cholelithiases 01/28/2017  . ESRD (end stage renal disease) ([place]) 01/03/2017  . Osteoporosis 12/11/2016  . Cholelithiasis 11/07/2016  . Cerebral embolism with transient ischemic attack (TIA)   . TIA (transient ischemic attack)   . AVD (aortic valve disease) 07/12/2016  . Pulmonary HTN ([place]) 07/12/2016  . Carpal tunnel syndrome 07/05/2016  . 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 CVA (cerebral vascular accident) ([place]) 05/16/2016  . History of aortic valve replacement with bioprosthetic valve 05/08/2016  . Aortic valve prosthesis present 02/25/2016  . Bacteremia due to coagulase-negative Staphylococcus   . S/P dialysis catheter insertion ([place])   . Neck pain   . Endocarditis   . Anemia   . Renal dialysis device, implant, or graft complication 37/48/2707  . Pain and swelling of right upper extremity 12/20/2015  . Complex endometrial hyperplasia with atypia 11/08/2015  . Increased endometrial stripe thickness 11/03/2015  . Obesity 10/28/2014  . Health care maintenance 10/28/2014  . Right carotid bruit 01/08/2013  . Generalized headaches 01/08/2013  . Chronic female pelvic pain 08/08/2012  . Gout, unspecified 12/06/2008  . Esophageal reflux 12/06/2008  . Backache 12/06/2008  . FIBROIDS, UTERUS 11/24/2008  . ESRD (end stage renal disease) on dialysis [facility]) 07/01/2006    Darrel Hoover  PT 03/26/2017, 12:55 PM  [facility] [address] [place], [place], [address] Phone: [phone]   Fax:  [phone]  Name: Petula A Marks MRN: [id number] Date of Birth: March 29, 1955

## 2017-03-27 DIAGNOSIS — D509 Iron deficiency anemia, unspecified: Secondary | ICD-10-CM | POA: Diagnosis not present

## 2017-03-27 DIAGNOSIS — N186 End stage renal disease: Secondary | ICD-10-CM | POA: Diagnosis not present

## 2017-03-27 DIAGNOSIS — D631 Anemia in chronic kidney disease: Secondary | ICD-10-CM | POA: Diagnosis not present

## 2017-03-27 DIAGNOSIS — N2581 Secondary hyperparathyroidism of renal origin: Secondary | ICD-10-CM | POA: Diagnosis not present

## 2017-03-28 ENCOUNTER — Ambulatory Visit: Payer: Medicare Other | Admitting: Physical Therapy

## 2017-03-28 ENCOUNTER — Encounter: Payer: Self-pay | Admitting: Physical Therapy

## 2017-03-28 DIAGNOSIS — M545 Low back pain, unspecified: Secondary | ICD-10-CM

## 2017-03-28 DIAGNOSIS — M62830 Muscle spasm of back: Secondary | ICD-10-CM | POA: Diagnosis not present

## 2017-03-28 DIAGNOSIS — G8929 Other chronic pain: Secondary | ICD-10-CM

## 2017-03-28 DIAGNOSIS — N186 End stage renal disease: Secondary | ICD-10-CM | POA: Diagnosis not present

## 2017-03-28 DIAGNOSIS — Z992 Dependence on renal dialysis: Secondary | ICD-10-CM | POA: Diagnosis not present

## 2017-03-28 DIAGNOSIS — N039 Chronic nephritic syndrome with unspecified morphologic changes: Secondary | ICD-10-CM | POA: Diagnosis not present

## 2017-03-28 DIAGNOSIS — R293 Abnormal posture: Secondary | ICD-10-CM | POA: Diagnosis not present

## 2017-03-28 NOTE — Patient Instructions (Signed)

## 2017-03-28 NOTE — Therapy (Signed)
[place], [place], [address] Phone: [phone]   Fax:  [phone]  Physical Therapy Treatment  Patient Details  Name: [name] Marks MRN: [id number] Date of Birth: [birth year] Referring Provider: Madison Hickman, MD   Encounter Date: 03/28/2017  PT End of Session - 03/28/17 1250    Visit Number  2    Number of Visits  8    Date for PT Re-Evaluation  04/19/17    PT Start Time  6270    PT Stop Time  1233    PT Time Calculation (min)  48 min    Activity Tolerance  Patient tolerated treatment well    Behavior During Therapy  [facility] for tasks assessed/performed       Past Medical History:  Diagnosis Date  . Anemia   . Arthritis    "knees" (01/03/2017)  . Carpal tunnel syndrome   . Complication of anesthesia    01/01/17- '"a long time ago, difficulty breathimg, not sure if it was due to anesthesia or not."  . End stage renal disease on dialysis ([place])    "MWF; [place]." (01/03/2017)  . GERD (gastroesophageal reflux disease)   . Gout   . History of blood transfusion 2017   "related to blood poison" (01/03/2017)  . Osteoporosis 09/2016   T score -2.6  . Renal insufficiency   . Septic shock ([place])   . Staphylococcus aureus bacteremia   . TIA (transient ischemic attack)    "several" (01/03/2017)    Past Surgical History:  Procedure Laterality Date  . AORTIC VALVE REPLACEMENT N/A 02/25/2016   Procedure: AORTIC VALVE REPLACEMENT (AVR) implanted with Magna Ease Aortic valve size 7mm;  Surgeon: Melrose Nakayama, MD;  Location: [facility];  Service: Open Heart Surgery;  Laterality: N/A;  . AV FISTULA PLACEMENT Left 01/03/2017   Procedure: INSERTION OF ARTERIOVENOUS (AV) GORE-TEX GRAFT LEFT THIGH;  Surgeon: Serafina Mitchell, MD;  Location: [facility];  Service: Vascular;  Laterality: Left;  . [place] REMOVAL Right 02/17/2016   Procedure: REMOVAL OF TWO ARTERIOVENOUS GORETEX GRAFTS ([place]);  Surgeon: Angelia Mould, MD;  Location: [facility];  Service: Vascular;  Laterality: Right;  . BREAST BIOPSY Left 2013   stereo   . BREAST BIOPSY Right 2011   stereo   . CARDIAC VALVE REPLACEMENT    . CHOLECYSTECTOMY N/A 01/30/2017   Procedure: LAPAROSCOPIC CHOLECYSTECTOMY;  Surgeon: Erroll Luna, MD;  Location: [facility];  Service: General;  Laterality: N/A;  . COLONOSCOPY W/ POLYPECTOMY    . DG AV DIALYSIS GRAFT DECLOT OR    . DILATATION & CURETTAGE/HYSTEROSCOPY WITH TRUECLEAR N/A 11/06/2012   Procedure: DILATATION & CURETTAGE/HYSTEROSCOPY WITH TRUECLEAR;  Surgeon: Terrance Mass, MD;  Location: [facility];  Service: Gynecology;  Laterality: N/A;  Truclear Resectoscopic Polypectomy   . INSERTION OF DIALYSIS CATHETER N/A 02/19/2016   Procedure: INSERTION OF Left Internal Jugular DIALYSIS CATHETER;  Surgeon: Angelia Mould, MD;  Location: [place];  Service: Vascular;  Laterality: N/A;  . LOOP RECORDER INSERTION N/A 10/01/2016   Procedure: LOOP RECORDER INSERTION;  Surgeon: Constance Haw, MD;  Location: [place] [facility];  Service: Cardiovascular;  Laterality: N/A;  . PATCH ANGIOPLASTY Right 02/17/2016   Procedure: PATCH ANGIOPLASTY;  Surgeon: Angelia Mould, MD;  Location: [facility] [place];  Service: Vascular;  Laterality: Right;  . PERIPHERAL VASCULAR CATHETERIZATION N/A 09/14/2014   Procedure: A/V Shuntogram/Fistulagram;  Surgeon: Katha Cabal, MD;  Location: [facility] [facility];  Service: Cardiovascular;  Laterality: N/A;  . PERIPHERAL VASCULAR CATHETERIZATION N/A 09/14/2014   Procedure: A/V Shunt Intervention;  Surgeon: Katha Cabal, MD;  Location: [place] [facility];  Service: Cardiovascular;  Laterality: N/A;  . PERIPHERAL VASCULAR CATHETERIZATION Right 12/09/2014   Procedure: A/V Shuntogram/Fistulagram;  Surgeon: Algernon Huxley, MD;  Location: [place] [facility];  Service: Cardiovascular;  Laterality: Right;  . PERIPHERAL VASCULAR CATHETERIZATION N/A 12/09/2014   Procedure: A/V Shunt  Intervention;  Surgeon: Algernon Huxley, MD;  Location: [place] [facility];  Service: Cardiovascular;  Laterality: N/A;  . PERIPHERAL VASCULAR CATHETERIZATION Right 05/24/2015   Procedure: A/V Shuntogram;  Surgeon: Serafina Mitchell, MD;  Location: [place] [facility];  Service: Cardiovascular;  Laterality: Right;  . PERIPHERAL VASCULAR CATHETERIZATION Right 05/24/2015   Procedure: Peripheral Vascular Balloon Angioplasty;  Surgeon: Serafina Mitchell, MD;  Location: [place] [facility];  Service: Cardiovascular;  Laterality: Right;  right arm shunt  . PERIPHERAL VASCULAR CATHETERIZATION N/A 06/13/2015   Procedure: A/V Shuntogram/Fistulagram;  Surgeon: Algernon Huxley, MD;  Location: [place] [facility];  Service: Cardiovascular;  Laterality: N/A;  . PERIPHERAL VASCULAR CATHETERIZATION N/A 06/13/2015   Procedure: A/V Shunt Intervention;  Surgeon: Algernon Huxley, MD;  Location: [place] [facility];  Service: Cardiovascular;  Laterality: N/A;  . TEE WITHOUT CARDIOVERSION N/A 02/22/2016   Procedure: TRANSESOPHAGEAL ECHOCARDIOGRAM (TEE);  Surgeon: Pixie Casino, MD;  Location: [facility] ENDOSCOPY;  Service: Cardiovascular;  Laterality: N/A;  . TEE WITHOUT CARDIOVERSION N/A 02/25/2016   Procedure: TRANSESOPHAGEAL ECHOCARDIOGRAM (TEE);  Surgeon: Melrose Nakayama, MD;  Location: [place];  Service: Open Heart Surgery;  Laterality: N/A;  . TEE WITHOUT CARDIOVERSION N/A 10/01/2016   Procedure: TRANSESOPHAGEAL ECHOCARDIOGRAM (TEE);  Surgeon: Pixie Casino, MD;  Location: [facility] ENDOSCOPY;  Service: Cardiovascular;  Laterality: N/A;  . TUBAL LIGATION  1983    There were no vitals filed for this visit.  Subjective Assessment - 03/28/17 1152    Subjective  back has not bothered her in the last couple of weeks.  i try not to aggravate it.                        [place] Adult PT Treatment/Exercise - 03/28/17 0001      Self-Care   Self-Care  ADL's    ADL's  handout briefly reviewed.        Exercises   Exercises   Lumbar;Knee/Hip      Lumbar Exercises: Stretches   Passive Hamstring Stretch  3 reps;30 seconds    Lower Trunk Rotation  5 reps;10 seconds    Pelvic Tilt  5 seconds 10 reps      Lumbar Exercises: Supine   Bridge  10 reps    Bridge Limitations  small lifts,  no pain      Knee/Hip Exercises: Standing   Other Standing Knee Exercises  YOGA walking  with inner thigh focus was able to decrease right knee pain  with this technique      Knee/Hip Exercises: Supine   Quad Sets  10 reps  5 seconds,  both,   small roll under knees.      Knee/Hip Exercises: Sidelying   Clams  10 X each side min assist for hip position  .        Moist Heat Therapy   Number Minutes Moist Heat  10 Minutes    Moist Heat Location  Knee concurrent with manual patella mobs  Manual Therapy   Manual therapy comments  concurrent with moist heat patellar mobilization             PT Education - 03/28/17 1157    Education provided  Yes    Education Details  ADL    Person(s) Educated  Patient    Methods  Explanation;Demonstration;Verbal cues;Handout    Comprehension  Verbalized understanding;Returned demonstration          PT Long Term Goals - 03/26/17 1225      PT LONG TERM GOAL #1   Title  She will be independent with all HEP issued.     Time  4    Period  Weeks    Status  New      PT LONG TERM GOAL #2   Title  She will report pain eased 50% with lifting and home tasks.     Time  4    Period  Weeks    Status  New      PT LONG TERM GOAL #3   Title  she will report no sleep disturbance due to back pain.     Time  4    Period  Weeks    Status  New      PT LONG TERM GOAL #4   Title  She will verbalize and demo understanding of good body mechanics    Time  4    Period  Weeks    Status  New      PT LONG TERM GOAL #5   Title  FOTO score improved to 40% limited or less    Time  4    Period  Weeks    Status  New            Plan - 03/28/17 1252    Clinical Impression Statement   She has no back pain today.   Knees are sore.  she has been doing her HEP.  Her legs are weak.  She would like to get back to  walking for exercises.    PT Next Visit Plan  REview HEp and add to HEP  Consider Closed chain    PT Home Exercise Plan  PPT , knee to chest,  LTR, bridge,     Consulted and Agree with Plan of Care  Patient       Patient will benefit from skilled therapeutic intervention in order to improve the following deficits and impairments:     Visit Diagnosis: Chronic right-sided low back pain without sciatica  Muscle spasm of back  Abnormal posture     Problem List Patient Active Problem List   Diagnosis Date Noted  . S/P cholecystectomy 02/14/2017  . Cholelithiases 01/28/2017  . ESRD (end stage renal disease) ([place]) 01/03/2017  . Osteoporosis 12/11/2016  . Cholelithiasis 11/07/2016  . Cerebral embolism with transient ischemic attack (TIA)   . TIA (transient ischemic attack)   . AVD (aortic valve disease) 07/12/2016  . Pulmonary HTN ([place]) 07/12/2016  . Carpal tunnel syndrome 07/05/2016  . 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 CVA (cerebral vascular accident) ([place]) 05/16/2016  . History of aortic valve replacement with bioprosthetic valve 05/08/2016  . Aortic valve prosthesis present 02/25/2016  . Bacteremia due to coagulase-negative Staphylococcus   . S/P dialysis catheter insertion ([place])   . Neck pain   . Endocarditis   . Anemia   . Renal dialysis device, implant, or graft complication 67/67/2094  . Pain and swelling of right upper extremity 12/20/2015  .  Complex endometrial hyperplasia with atypia 11/08/2015  . Increased endometrial stripe thickness 11/03/2015  . Obesity 10/28/2014  . Health care maintenance 10/28/2014  . Right carotid bruit 01/08/2013  . Generalized headaches 01/08/2013  . Chronic female pelvic pain 08/08/2012  . Gout, unspecified 12/06/2008  . Esophageal reflux 12/06/2008  . Backache 12/06/2008  . FIBROIDS, UTERUS 11/24/2008   . ESRD (end stage renal disease) on dialysis [facility]) 07/01/2006    HARRIS,KAREN PTA 03/28/2017, 12:58 PM  [place] [facility] [address] [place], [place], [address] Phone: [phone]   Fax:  [phone]  Name: [name] Marks MRN: [id number] Date of Birth: 08-12-1955

## 2017-03-29 DIAGNOSIS — N2581 Secondary hyperparathyroidism of renal origin: Secondary | ICD-10-CM | POA: Diagnosis not present

## 2017-03-29 DIAGNOSIS — Z992 Dependence on renal dialysis: Secondary | ICD-10-CM | POA: Diagnosis not present

## 2017-03-29 DIAGNOSIS — N186 End stage renal disease: Secondary | ICD-10-CM | POA: Diagnosis not present

## 2017-03-29 DIAGNOSIS — I639 Cerebral infarction, unspecified: Secondary | ICD-10-CM | POA: Diagnosis not present

## 2017-03-29 DIAGNOSIS — D509 Iron deficiency anemia, unspecified: Secondary | ICD-10-CM | POA: Diagnosis not present

## 2017-03-29 DIAGNOSIS — N039 Chronic nephritic syndrome with unspecified morphologic changes: Secondary | ICD-10-CM | POA: Diagnosis not present

## 2017-03-29 DIAGNOSIS — D631 Anemia in chronic kidney disease: Secondary | ICD-10-CM | POA: Diagnosis not present

## 2017-04-01 ENCOUNTER — Ambulatory Visit (INDEPENDENT_AMBULATORY_CARE_PROVIDER_SITE_OTHER): Payer: Medicare Other | Admitting: *Deleted

## 2017-04-01 DIAGNOSIS — N186 End stage renal disease: Secondary | ICD-10-CM | POA: Diagnosis not present

## 2017-04-01 DIAGNOSIS — D631 Anemia in chronic kidney disease: Secondary | ICD-10-CM | POA: Diagnosis not present

## 2017-04-01 DIAGNOSIS — N2581 Secondary hyperparathyroidism of renal origin: Secondary | ICD-10-CM | POA: Diagnosis not present

## 2017-04-01 DIAGNOSIS — D509 Iron deficiency anemia, unspecified: Secondary | ICD-10-CM | POA: Diagnosis not present

## 2017-04-01 DIAGNOSIS — I639 Cerebral infarction, unspecified: Secondary | ICD-10-CM

## 2017-04-01 NOTE — Progress Notes (Signed)
[facility] Summary Reprot / Loop Recorder 

## 2017-04-03 DIAGNOSIS — N2581 Secondary hyperparathyroidism of renal origin: Secondary | ICD-10-CM | POA: Diagnosis not present

## 2017-04-03 DIAGNOSIS — D631 Anemia in chronic kidney disease: Secondary | ICD-10-CM | POA: Diagnosis not present

## 2017-04-03 DIAGNOSIS — N186 End stage renal disease: Secondary | ICD-10-CM | POA: Diagnosis not present

## 2017-04-03 DIAGNOSIS — D509 Iron deficiency anemia, unspecified: Secondary | ICD-10-CM | POA: Diagnosis not present

## 2017-04-04 ENCOUNTER — Ambulatory Visit: Payer: Medicare Other | Attending: Family Medicine | Admitting: Physical Therapy

## 2017-04-04 ENCOUNTER — Encounter: Payer: Self-pay | Admitting: Physical Therapy

## 2017-04-04 DIAGNOSIS — M545 Low back pain: Secondary | ICD-10-CM | POA: Diagnosis not present

## 2017-04-04 DIAGNOSIS — R293 Abnormal posture: Secondary | ICD-10-CM | POA: Diagnosis not present

## 2017-04-04 DIAGNOSIS — M62830 Muscle spasm of back: Secondary | ICD-10-CM | POA: Diagnosis not present

## 2017-04-04 DIAGNOSIS — G8929 Other chronic pain: Secondary | ICD-10-CM

## 2017-04-04 NOTE — Therapy (Signed)
[place], [place], [address] Phone: [phone]   Fax:  [phone]  Physical Therapy Treatment  Patient Details  Name: [name] Marks MRN: [id number] Date of Birth: 05/08/1955 Referring Provider: Madison Hickman, MD   Encounter Date: 04/04/2017  PT End of Session - 04/04/17 1227    Visit Number  3    Number of Visits  8    Date for PT Re-Evaluation  04/19/17    PT Start Time  7371    PT Stop Time  1226    PT Time Calculation (min)  41 min    Activity Tolerance  Patient tolerated treatment well    Behavior During Therapy  [facility] for tasks assessed/performed       Past Medical History:  Diagnosis Date  . Anemia   . Arthritis    "knees" (01/03/2017)  . Carpal tunnel syndrome   . Complication of anesthesia    01/01/17- '"a long time ago, difficulty breathimg, not sure if it was due to anesthesia or not."  . End stage renal disease on dialysis ([place])    "MWF; [place]." (01/03/2017)  . GERD (gastroesophageal reflux disease)   . Gout   . History of blood transfusion 2017   "related to blood poison" (01/03/2017)  . Osteoporosis 09/2016   T score -2.6  . Renal insufficiency   . Septic shock ([place])   . Staphylococcus aureus bacteremia   . TIA (transient ischemic attack)    "several" (01/03/2017)    Past Surgical History:  Procedure Laterality Date  . AORTIC VALVE REPLACEMENT N/A 02/25/2016   Procedure: AORTIC VALVE REPLACEMENT (AVR) implanted with Magna Ease Aortic valve size 61mm;  Surgeon: Melrose Nakayama, MD;  Location: [place];  Service: Open Heart Surgery;  Laterality: N/A;  . AV FISTULA PLACEMENT Left 01/03/2017   Procedure: INSERTION OF ARTERIOVENOUS (AV) GORE-TEX GRAFT LEFT THIGH;  Surgeon: Serafina Mitchell, MD;  Location: [place];  Service: Vascular;  Laterality: Left;  . [place] REMOVAL Right 02/17/2016   Procedure: REMOVAL OF TWO ARTERIOVENOUS GORETEX GRAFTS ([place]);  Surgeon: Angelia Mould, MD;  Location: [place];  Service: Vascular;  Laterality: Right;  . BREAST BIOPSY Left 2013   stereo   . BREAST BIOPSY Right 2011   stereo   . CARDIAC VALVE REPLACEMENT    . CHOLECYSTECTOMY N/A 01/30/2017   Procedure: LAPAROSCOPIC CHOLECYSTECTOMY;  Surgeon: Erroll Luna, MD;  Location: [place];  Service: General;  Laterality: N/A;  . COLONOSCOPY W/ POLYPECTOMY    . DG AV DIALYSIS GRAFT DECLOT OR    . DILATATION & CURETTAGE/HYSTEROSCOPY WITH TRUECLEAR N/A 11/06/2012   Procedure: DILATATION & CURETTAGE/HYSTEROSCOPY WITH TRUECLEAR;  Surgeon: Terrance Mass, MD;  Location: [place] [facility];  Service: Gynecology;  Laterality: N/A;  Truclear Resectoscopic Polypectomy   . INSERTION OF DIALYSIS CATHETER N/A 02/19/2016   Procedure: INSERTION OF Left Internal Jugular DIALYSIS CATHETER;  Surgeon: Angelia Mould, MD;  Location: [place];  Service: Vascular;  Laterality: N/A;  . LOOP RECORDER INSERTION N/A 10/01/2016   Procedure: LOOP RECORDER INSERTION;  Surgeon: Constance Haw, MD;  Location: [place] [facility];  Service: Cardiovascular;  Laterality: N/A;  . PATCH ANGIOPLASTY Right 02/17/2016   Procedure: PATCH ANGIOPLASTY;  Surgeon: Angelia Mould, MD;  Location: [place];  Service: Vascular;  Laterality: Right;  . PERIPHERAL VASCULAR CATHETERIZATION N/A 09/14/2014   Procedure: A/V Shuntogram/Fistulagram;  Surgeon: Katha Cabal, MD;  Location: [place] [facility];  Service: Cardiovascular;  Laterality: N/A;  . PERIPHERAL VASCULAR CATHETERIZATION N/A 09/14/2014   Procedure: A/V Shunt Intervention;  Surgeon: Katha Cabal, MD;  Location: [place] [facility];  Service: Cardiovascular;  Laterality: N/A;  . PERIPHERAL VASCULAR CATHETERIZATION Right 12/09/2014   Procedure: A/V Shuntogram/Fistulagram;  Surgeon: Algernon Huxley, MD;  Location: [place] [facility];  Service: Cardiovascular;  Laterality: Right;  . PERIPHERAL VASCULAR CATHETERIZATION N/A 12/09/2014   Procedure: A/V Shunt  Intervention;  Surgeon: Algernon Huxley, MD;  Location: [place] [facility];  Service: Cardiovascular;  Laterality: N/A;  . PERIPHERAL VASCULAR CATHETERIZATION Right 05/24/2015   Procedure: A/V Shuntogram;  Surgeon: Serafina Mitchell, MD;  Location: [place] [facility];  Service: Cardiovascular;  Laterality: Right;  . PERIPHERAL VASCULAR CATHETERIZATION Right 05/24/2015   Procedure: Peripheral Vascular Balloon Angioplasty;  Surgeon: Serafina Mitchell, MD;  Location: [place] [facility];  Service: Cardiovascular;  Laterality: Right;  right arm shunt  . PERIPHERAL VASCULAR CATHETERIZATION N/A 06/13/2015   Procedure: A/V Shuntogram/Fistulagram;  Surgeon: Algernon Huxley, MD;  Location: [place] [facility];  Service: Cardiovascular;  Laterality: N/A;  . PERIPHERAL VASCULAR CATHETERIZATION N/A 06/13/2015   Procedure: A/V Shunt Intervention;  Surgeon: Algernon Huxley, MD;  Location: [place] [facility];  Service: Cardiovascular;  Laterality: N/A;  . TEE WITHOUT CARDIOVERSION N/A 02/22/2016   Procedure: TRANSESOPHAGEAL ECHOCARDIOGRAM (TEE);  Surgeon: Pixie Casino, MD;  Location: [facility];  Service: Cardiovascular;  Laterality: N/A;  . TEE WITHOUT CARDIOVERSION N/A 02/25/2016   Procedure: TRANSESOPHAGEAL ECHOCARDIOGRAM (TEE);  Surgeon: Melrose Nakayama, MD;  Location: [place];  Service: Open Heart Surgery;  Laterality: N/A;  . TEE WITHOUT CARDIOVERSION N/A 10/01/2016   Procedure: TRANSESOPHAGEAL ECHOCARDIOGRAM (TEE);  Surgeon: Pixie Casino, MD;  Location: [facility] ENDOSCOPY;  Service: Cardiovascular;  Laterality: N/A;  . TUBAL LIGATION  1983    There were no vitals filed for this visit.  Subjective Assessment - 04/04/17 1145    Subjective  back is still doing well    Currently in Pain?  No/denies                      [facility] Adult PT Treatment/Exercise - 04/04/17 1150      Lumbar Exercises: Stretches   Single Knee to Chest Stretch  Right;Left;3 reps;30 seconds    Lower Trunk Rotation  10 seconds  bil x 10 reps      Lumbar Exercises: Aerobic   Nustep  L5 x 8 min      Lumbar Exercises: Standing   Heel Raises  15 reps with toe raises    Scapular Retraction  Both;15 reps;Theraband    Theraband Level (Scapular Retraction)  Level 2 (Red)    Shoulder Extension  Both;15 reps;Theraband    Theraband Level (Shoulder Extension)  Level 2 (Red)      Lumbar Exercises: Seated   Sit to Stand  10 reps      Knee/Hip Exercises: Standing   Hip Abduction  Both;15 reps;Knee straight 2#    Hip Extension  Both;15 reps;Knee straight 2#                  PT Long Term Goals - 03/26/17 1225      PT LONG TERM GOAL #1   Title  She will be independent with all HEP issued.     Time  4    Period  Weeks    Status  New  PT LONG TERM GOAL #2   Title  She will report pain eased 50% with lifting and home tasks.     Time  4    Period  Weeks    Status  New      PT LONG TERM GOAL #3   Title  she will report no sleep disturbance due to back pain.     Time  4    Period  Weeks    Status  New      PT LONG TERM GOAL #4   Title  She will verbalize and demo understanding of good body mechanics    Time  4    Period  Weeks    Status  New      PT LONG TERM GOAL #5   Title  FOTO score improved to 40% limited or less    Time  4    Period  Weeks    Status  New            Plan - 04/04/17 1227    Clinical Impression Statement  Pt tolerated exercises well today and initiated standing exercies today without increase in pain in back or knees.  Will continue to benefit from PT to maximize function.    PT Treatment/Interventions  Therapeutic exercise;Patient/family education;Manual techniques;Moist Heat    PT Next Visit Plan  add additional strengthening exercises to HEP, continue core/hip strengthening    Consulted and Agree with Plan of Care  Patient       Patient will benefit from skilled therapeutic intervention in order to improve the following deficits and impairments:  Difficulty  walking, Obesity, Decreased strength, Decreased range of motion, Postural dysfunction, Pain  Visit Diagnosis: Chronic right-sided low back pain without sciatica  Muscle spasm of back  Abnormal posture     Problem List Patient Active Problem List   Diagnosis Date Noted  . S/P cholecystectomy 02/14/2017  . Cholelithiases 01/28/2017  . ESRD (end stage renal disease) ([place]) 01/03/2017  . Osteoporosis 12/11/2016  . Cholelithiasis 11/07/2016  . Cerebral embolism with transient ischemic attack (TIA)   . TIA (transient ischemic attack)   . AVD (aortic valve disease) 07/12/2016  . Pulmonary HTN ([place]) 07/12/2016  . Carpal tunnel syndrome 07/05/2016  . 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 CVA (cerebral vascular accident) ([place]) 05/16/2016  . History of aortic valve replacement with bioprosthetic valve 05/08/2016  . Aortic valve prosthesis present 02/25/2016  . Bacteremia due to coagulase-negative Staphylococcus   . S/P dialysis catheter insertion ([place])   . Neck pain   . Endocarditis   . Anemia   . Renal dialysis device, implant, or graft complication 93/81/0175  . Pain and swelling of right upper extremity 12/20/2015  . Complex endometrial hyperplasia with atypia 11/08/2015  . Increased endometrial stripe thickness 11/03/2015  . Obesity 10/28/2014  . Health care maintenance 10/28/2014  . Right carotid bruit 01/08/2013  . Generalized headaches 01/08/2013  . Chronic female pelvic pain 08/08/2012  . Gout, unspecified 12/06/2008  . Esophageal reflux 12/06/2008  . Backache 12/06/2008  . FIBROIDS, UTERUS 11/24/2008  . ESRD (end stage renal disease) on dialysis ([place]) 07/01/2006      Laureen Abrahams, PT, DPT 04/04/17 12:30 PM     [place] [facility] [address] [place], [place], [address] Phone: [phone]   Fax:  [phone]  Name: Lynasia A Marks MRN: [id number] Date of Birth: 1955/07/12

## 2017-04-05 DIAGNOSIS — N186 End stage renal disease: Secondary | ICD-10-CM | POA: Diagnosis not present

## 2017-04-05 DIAGNOSIS — N2581 Secondary hyperparathyroidism of renal origin: Secondary | ICD-10-CM | POA: Diagnosis not present

## 2017-04-05 DIAGNOSIS — D509 Iron deficiency anemia, unspecified: Secondary | ICD-10-CM | POA: Diagnosis not present

## 2017-04-05 DIAGNOSIS — D631 Anemia in chronic kidney disease: Secondary | ICD-10-CM | POA: Diagnosis not present

## 2017-04-08 DIAGNOSIS — N186 End stage renal disease: Secondary | ICD-10-CM | POA: Diagnosis not present

## 2017-04-08 DIAGNOSIS — D631 Anemia in chronic kidney disease: Secondary | ICD-10-CM | POA: Diagnosis not present

## 2017-04-08 DIAGNOSIS — D509 Iron deficiency anemia, unspecified: Secondary | ICD-10-CM | POA: Diagnosis not present

## 2017-04-08 DIAGNOSIS — N2581 Secondary hyperparathyroidism of renal origin: Secondary | ICD-10-CM | POA: Diagnosis not present

## 2017-04-09 ENCOUNTER — Ambulatory Visit: Payer: Medicare Other | Admitting: Physical Therapy

## 2017-04-09 ENCOUNTER — Encounter: Payer: Self-pay | Admitting: Physical Therapy

## 2017-04-09 DIAGNOSIS — M545 Low back pain: Principal | ICD-10-CM

## 2017-04-09 DIAGNOSIS — G8929 Other chronic pain: Secondary | ICD-10-CM | POA: Diagnosis not present

## 2017-04-09 DIAGNOSIS — M62830 Muscle spasm of back: Secondary | ICD-10-CM

## 2017-04-09 DIAGNOSIS — R293 Abnormal posture: Secondary | ICD-10-CM

## 2017-04-09 LAB — CUP PACEART REMOTE DEVICE CHECK
MDC IDC PG IMPLANT DT: 20180806
MDC IDC SESS DTM: 20190202224053

## 2017-04-09 NOTE — Therapy (Signed)
[place], [place], [address] Phone: [phone]   Fax:  [phone]  Physical Therapy Treatment  Patient Details  Name: [name] Marks MRN: [id number] Date of Birth: 1955-09-13 Referring Provider: Madison Hickman, MD   Encounter Date: 04/09/2017  PT End of Session - 04/09/17 1305    Visit Number  4    Number of Visits  8    Date for PT Re-Evaluation  04/19/17    PT Start Time  1941    PT Stop Time  1230    PT Time Calculation (min)  43 min    Activity Tolerance  Patient tolerated treatment well    Behavior During Therapy  [facility] for tasks assessed/performed       Past Medical History:  Diagnosis Date  . Anemia   . Arthritis    "knees" (01/03/2017)  . Carpal tunnel syndrome   . Complication of anesthesia    01/01/17- '"a long time ago, difficulty breathimg, not sure if it was due to anesthesia or not."  . End stage renal disease on dialysis ([place])    "MWF; [place]." (01/03/2017)  . GERD (gastroesophageal reflux disease)   . Gout   . History of blood transfusion 2017   "related to blood poison" (01/03/2017)  . Osteoporosis 09/2016   T score -2.6  . Renal insufficiency   . Septic shock ([place])   . Staphylococcus aureus bacteremia   . TIA (transient ischemic attack)    "several" (01/03/2017)    Past Surgical History:  Procedure Laterality Date  . AORTIC VALVE REPLACEMENT N/A 02/25/2016   Procedure: AORTIC VALVE REPLACEMENT (AVR) implanted with Magna Ease Aortic valve size 69m;  Surgeon: SMelrose Nakayama MD;  [place]  Service: Open Heart Surgery;  Laterality: N/A;  . AV FISTULA PLACEMENT Left 01/03/2017   Procedure: INSERTION OF ARTERIOVENOUS (AV) GORE-TEX GRAFT LEFT THIGH;  Surgeon: BSerafina Mitchell MD;  [place]  Service: Vascular;  Laterality: Left;  . [place] 02/17/2016   Procedure: REMOVAL OF TWO ARTERIOVENOUS GORETEX GRAFTS ([place];  Surgeon: CAngelia Mould MD;  [place]  Service: Vascular;  Laterality: Right;  . BREAST BIOPSY Left 2013   stereo   . BREAST BIOPSY Right 2011   stereo   . CARDIAC VALVE REPLACEMENT    . CHOLECYSTECTOMY N/A 01/30/2017   Procedure: LAPAROSCOPIC CHOLECYSTECTOMY;  Surgeon: CErroll Luna MD;  [place]  Service: General;  Laterality: N/A;  . COLONOSCOPY W/ POLYPECTOMY    . DG AV DIALYSIS GRAFT DECLOT OR    . DILATATION & CURETTAGE/HYSTEROSCOPY WITH TRUECLEAR N/A 11/06/2012   Procedure: DILATATION & CURETTAGE/HYSTEROSCOPY WITH TRUECLEAR;  Surgeon: JTerrance Mass MD;  [place];  Service: Gynecology;  Laterality: N/A;  Truclear Resectoscopic Polypectomy   . INSERTION OF DIALYSIS CATHETER N/A 02/19/2016   Procedure: INSERTION OF Left Internal Jugular DIALYSIS CATHETER;  Surgeon: CAngelia Mould MD;  [place]  Service: Vascular;  Laterality: N/A;  . LOOP RECORDER INSERTION N/A 10/01/2016   Procedure: LOOP RECORDER INSERTION;  Surgeon: CConstance Haw MD;  [facility] [place] [facility];  Service: Cardiovascular;  Laterality: N/A;  . PATCH ANGIOPLASTY Right 02/17/2016   Procedure: PATCH ANGIOPLASTY;  Surgeon: CAngelia Mould MD;  [place]  Service: Vascular;  Laterality: Right;  . PERIPHERAL VASCULAR CATHETERIZATION N/A 09/14/2014   Procedure: A/V Shuntogram/Fistulagram;  Surgeon: GKatha Cabal MD;  [place];  Service: Cardiovascular;  Laterality: N/A;  . PERIPHERAL VASCULAR CATHETERIZATION N/A 09/14/2014   Procedure: A/V Shunt Intervention;  Surgeon: Katha Cabal, MD;  Location: [place] [facility];  Service: Cardiovascular;  Laterality: N/A;  . PERIPHERAL VASCULAR CATHETERIZATION Right 12/09/2014   Procedure: A/V Shuntogram/Fistulagram;  Surgeon: Algernon Huxley, MD;  Location: [place] [facility];  Service: Cardiovascular;  Laterality: Right;  . PERIPHERAL VASCULAR CATHETERIZATION N/A 12/09/2014   Procedure: A/V Shunt  Intervention;  Surgeon: Algernon Huxley, MD;  Location: [place] [facility];  Service: Cardiovascular;  Laterality: N/A;  . PERIPHERAL VASCULAR CATHETERIZATION Right 05/24/2015   Procedure: A/V Shuntogram;  Surgeon: Serafina Mitchell, MD;  Location: [place] [facility];  Service: Cardiovascular;  Laterality: Right;  . PERIPHERAL VASCULAR CATHETERIZATION Right 05/24/2015   Procedure: Peripheral Vascular Balloon Angioplasty;  Surgeon: Serafina Mitchell, MD;  Location: [place] [facility];  Service: Cardiovascular;  Laterality: Right;  right arm shunt  . PERIPHERAL VASCULAR CATHETERIZATION N/A 06/13/2015   Procedure: A/V Shuntogram/Fistulagram;  Surgeon: Algernon Huxley, MD;  Location: [place] [facility];  Service: Cardiovascular;  Laterality: N/A;  . PERIPHERAL VASCULAR CATHETERIZATION N/A 06/13/2015   Procedure: A/V Shunt Intervention;  Surgeon: Algernon Huxley, MD;  Location: [place] [facility];  Service: Cardiovascular;  Laterality: N/A;  . TEE WITHOUT CARDIOVERSION N/A 02/22/2016   Procedure: TRANSESOPHAGEAL ECHOCARDIOGRAM (TEE);  Surgeon: Pixie Casino, MD;  Location: [facility] ENDOSCOPY;  Service: Cardiovascular;  Laterality: N/A;  . TEE WITHOUT CARDIOVERSION N/A 02/25/2016   Procedure: TRANSESOPHAGEAL ECHOCARDIOGRAM (TEE);  Surgeon: Melrose Nakayama, MD;  Location: [place];  Service: Open Heart Surgery;  Laterality: N/A;  . TEE WITHOUT CARDIOVERSION N/A 10/01/2016   Procedure: TRANSESOPHAGEAL ECHOCARDIOGRAM (TEE);  Surgeon: Pixie Casino, MD;  Location: [facility] ENDOSCOPY;  Service: Cardiovascular;  Laterality: N/A;  . TUBAL LIGATION  1983    There were no vitals filed for this visit.  Subjective Assessment - 04/09/17 1153    Subjective  I am getting stronger.  No back pain in 3 weeks.     Currently in Pain?  No/denies    Pain Location  Back    Pain Relieving Factors  Being more careful with tasks                      [facility] Adult PT Treatment/Exercise - 04/09/17 0001      Lumbar Exercises:  Stretches   Passive Hamstring Stretch  3 reps;30 seconds    Single Knee to Chest Stretch  Right;Left;3 reps;30 seconds    Lower Trunk Rotation  10 seconds    Pelvic Tilt  5 reps cued breathing    Piriformis Stretch  3 reps;10 seconds      Lumbar Exercises: Aerobic   Nustep  L5 5 minutes      Lumbar Exercises: Standing   Wall Slides  10 reps    Scapular Retraction  Strengthening 10 C single arm cued initially    Row  10 reps    Other Standing Lumbar Exercises  mini squat at counter 10 X      Lumbar Exercises: Supine   Ab Set  5 reps    Bridge  10 reps    Bridge Limitations  able to lift a little higher      Knee/Hip Exercises: Stretches   Gastroc Stretch  3 reps;30 seconds incline       Knee/Hip Exercises: Standing   Hip Flexion  Both;10 reps 2 LBSmonitored  for neutral spine    Hip Abduction  Both;15 reps;Knee straight 2#    Hip Extension  Both;15 reps;Knee straight 2#             PT Education - 04/09/17 1304    Education provided  Yes    Education Details  Neutral spine with standing exercises    Person(s) Educated  Patient    Methods  Explanation;Demonstration    Comprehension  Returned demonstration;Verbalized understanding          PT Long Term Goals - 04/09/17 1308      PT LONG TERM GOAL #1   Baseline  independent with ex issued so far    Time  4    Period  Weeks    Status  On-going      PT LONG TERM GOAL #2   Title  She will report pain eased 50% with lifting and home tasks.     Time  4    Period  Weeks    Status  Unable to assess      PT LONG TERM GOAL #3   Title  she will report no sleep disturbance due to back pain.     Baseline  Not waking due to back pain    Time  4    Period  Weeks    Status  Achieved      PT LONG TERM GOAL #4   Title  She will verbalize and demo understanding of good body mechanics    Baseline  tries to use with ADL's    Time  4    Period  Weeks    Status  On-going      PT LONG TERM GOAL #5   Time  4    Period   Weeks    Status  Unable to assess            Plan - 04/09/17 1305    Clinical Impression Statement  Patient continues to be pain free with ever increasing exercise challanges.  LTG#3 met. She has had no sleep disturbances due to back pain.,    PT Next Visit Plan  add additional strengthening exercises to HEP, Consider lifting practice    PT Home Exercise Plan  PPT , knee to chest,  LTR, bridge,     Consulted and Agree with Plan of Care  Patient       Patient will benefit from skilled therapeutic intervention in order to improve the following deficits and impairments:     Visit Diagnosis: Chronic right-sided low back pain without sciatica  Muscle spasm of back  Abnormal posture     Problem List Patient Active Problem List   Diagnosis Date Noted  . S/P cholecystectomy 02/14/2017  . Cholelithiases 01/28/2017  . ESRD (end stage renal disease) ([place]) 01/03/2017  . Osteoporosis 12/11/2016  . Cholelithiasis 11/07/2016  . Cerebral embolism with transient ischemic attack (TIA)   . TIA (transient ischemic attack)   . AVD (aortic valve disease) 07/12/2016  . Pulmonary HTN ([place]) 07/12/2016  . Carpal tunnel syndrome 07/05/2016  . 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 CVA (cerebral vascular accident) ([place]) 05/16/2016  . History of aortic valve replacement with bioprosthetic valve 05/08/2016  . Aortic valve prosthesis present 02/25/2016  . Bacteremia due to coagulase-negative Staphylococcus   . S/P dialysis catheter insertion ([place])   . Neck pain   . Endocarditis   . Anemia   . Renal dialysis device, implant, or graft complication 82/95/6213  .  Pain and swelling of right upper extremity 12/20/2015  . Complex endometrial hyperplasia with atypia 11/08/2015  . Increased endometrial stripe thickness 11/03/2015  . Obesity 10/28/2014  . Health care maintenance 10/28/2014  . Right carotid bruit 01/08/2013  . Generalized headaches 01/08/2013  . Chronic female pelvic pain  08/08/2012  . Gout, unspecified 12/06/2008  . Esophageal reflux 12/06/2008  . Backache 12/06/2008  . FIBROIDS, UTERUS 11/24/2008  . ESRD (end stage renal disease) on dialysis ([place]) 07/01/2006    HARRIS,KAREN  PTA 04/09/2017, 1:10 PM  [facility] [address] [place], [place], [address] Phone: [phone]   Fax:  [phone]  Name: Yolandra A Marks MRN: [id number] Date of Birth: 10/16/1955

## 2017-04-10 DIAGNOSIS — D631 Anemia in chronic kidney disease: Secondary | ICD-10-CM | POA: Diagnosis not present

## 2017-04-10 DIAGNOSIS — N186 End stage renal disease: Secondary | ICD-10-CM | POA: Diagnosis not present

## 2017-04-10 DIAGNOSIS — N2581 Secondary hyperparathyroidism of renal origin: Secondary | ICD-10-CM | POA: Diagnosis not present

## 2017-04-10 DIAGNOSIS — D509 Iron deficiency anemia, unspecified: Secondary | ICD-10-CM | POA: Diagnosis not present

## 2017-04-11 ENCOUNTER — Ambulatory Visit: Payer: Medicare Other | Admitting: Physical Therapy

## 2017-04-11 DIAGNOSIS — R293 Abnormal posture: Secondary | ICD-10-CM | POA: Diagnosis not present

## 2017-04-11 DIAGNOSIS — G8929 Other chronic pain: Secondary | ICD-10-CM | POA: Diagnosis not present

## 2017-04-11 DIAGNOSIS — M62830 Muscle spasm of back: Secondary | ICD-10-CM | POA: Diagnosis not present

## 2017-04-11 DIAGNOSIS — M545 Low back pain, unspecified: Secondary | ICD-10-CM

## 2017-04-11 NOTE — Patient Instructions (Signed)
Calf Stretch    Place one leg forward, bent, other leg behind and straight. Lean forward keeping back heel flat. Hold 30__ seconds while counting out loud. Repeat with other leg forward. Repeat __3__ times. Do __1__ sessions per day  After walking.  http://gt2.exer.us/478   Copyright  VHI. All rights reserved.

## 2017-04-11 NOTE — Therapy (Addendum)
[place], [place], [address] Phone: [phone]   Fax:  [phone]  Physical Therapy Treatment/Discharge Patient Details  Name: [name] Marks MRN: [id number] Date of Birth: [birth year] Referring Provider: Madison Hickman, MD   Encounter Date: 04/11/2017  PT End of Session - 04/11/17 1642    Visit Number  5    Number of Visits  8    Date for PT Re-Evaluation  04/19/17    PT Start Time  1194    PT Stop Time  1627    PT Time Calculation (min)  41 min    Activity Tolerance  Patient tolerated treatment well    Behavior During Therapy  [facility] for tasks assessed/performed       Past Medical History:  Diagnosis Date  . Anemia   . Arthritis    "knees" (01/03/2017)  . Carpal tunnel syndrome   . Complication of anesthesia    01/01/17- '"a long time ago, difficulty breathimg, not sure if it was due to anesthesia or not."  . End stage renal disease on dialysis ([place])    "MWF; [place]." (01/03/2017)  . GERD (gastroesophageal reflux disease)   . Gout   . History of blood transfusion 2017   "related to blood poison" (01/03/2017)  . Osteoporosis 09/2016   T score -2.6  . Renal insufficiency   . Septic shock ([place])   . Staphylococcus aureus bacteremia   . TIA (transient ischemic attack)    "several" (01/03/2017)    Past Surgical History:  Procedure Laterality Date  . AORTIC VALVE REPLACEMENT N/A 02/25/2016   Procedure: AORTIC VALVE REPLACEMENT (AVR) implanted with Magna Ease Aortic valve size 54m;  Surgeon: SMelrose Nakayama MD;  Location: [place]  Service: Open Heart Surgery;  Laterality: N/A;  . AV FISTULA PLACEMENT Left 01/03/2017   Procedure: INSERTION OF ARTERIOVENOUS (AV) GORE-TEX GRAFT LEFT THIGH;  Surgeon: BSerafina Mitchell MD;  [place]: [place]  Service: Vascular;  Laterality: Left;  . AHughesREMOVAL Right 02/17/2016   Procedure: REMOVAL OF TWO ARTERIOVENOUS GORETEX GRAFTS ([place];  Surgeon:  CAngelia Mould MD;  [place]  Service: Vascular;  Laterality: Right;  . BREAST BIOPSY Left 2013   stereo   . BREAST BIOPSY Right 2011   stereo   . CARDIAC VALVE REPLACEMENT    . CHOLECYSTECTOMY N/A 01/30/2017   Procedure: LAPAROSCOPIC CHOLECYSTECTOMY;  Surgeon: CErroll Luna MD;  [place]: [place]  Service: General;  Laterality: N/A;  . COLONOSCOPY W/ POLYPECTOMY    . DG AV DIALYSIS GRAFT DECLOT OR    . DILATATION & CURETTAGE/HYSTEROSCOPY WITH TRUECLEAR N/A 11/06/2012   Procedure: DILATATION & CURETTAGE/HYSTEROSCOPY WITH TRUECLEAR;  Surgeon: JTerrance Mass MD;  [place];  Service: Gynecology;  Laterality: N/A;  Truclear Resectoscopic Polypectomy   . INSERTION OF DIALYSIS CATHETER N/A 02/19/2016   Procedure: INSERTION OF Left Internal Jugular DIALYSIS CATHETER;  Surgeon: CAngelia Mould MD;  Location: [place]  Service: Vascular;  Laterality: N/A;  . LOOP RECORDER INSERTION N/A 10/01/2016   Procedure: LOOP RECORDER INSERTION;  Surgeon: CConstance Haw MD;  [place];  Service: Cardiovascular;  Laterality: N/A;  . PATCH ANGIOPLASTY Right 02/17/2016   Procedure: PATCH ANGIOPLASTY;  Surgeon: CAngelia Mould MD;  [place]: [place]  Service: Vascular;  Laterality: Right;  . PERIPHERAL VASCULAR CATHETERIZATION N/A 09/14/2014   Procedure: A/V Shuntogram/Fistulagram;  Surgeon: GKatha Cabal MD;  [place] [place];  Service: Cardiovascular;  Laterality: N/A;  . PERIPHERAL VASCULAR CATHETERIZATION N/A 09/14/2014   Procedure: A/V Shunt Intervention;  Surgeon: Katha Cabal, MD;  Location: [place] CV LAB;  Service: Cardiovascular;  Laterality: N/A;  . PERIPHERAL VASCULAR CATHETERIZATION Right 12/09/2014   Procedure: A/V Shuntogram/Fistulagram;  Surgeon: Algernon Huxley, MD;  Location: [place] CV LAB;  Service: Cardiovascular;  Laterality: Right;  . PERIPHERAL VASCULAR CATHETERIZATION N/A 12/09/2014   Procedure: A/V  Shunt Intervention;  Surgeon: Algernon Huxley, MD;  Location: [place] [facility] LAB;  Service: Cardiovascular;  Laterality: N/A;  . PERIPHERAL VASCULAR CATHETERIZATION Right 05/24/2015   Procedure: A/V Shuntogram;  Surgeon: Serafina Mitchell, MD;  Location: [place] [facility] LAB;  Service: Cardiovascular;  Laterality: Right;  . PERIPHERAL VASCULAR CATHETERIZATION Right 05/24/2015   Procedure: Peripheral Vascular Balloon Angioplasty;  Surgeon: Serafina Mitchell, MD;  Location: [place] [facility] LAB;  Service: Cardiovascular;  Laterality: Right;  right arm shunt  . PERIPHERAL VASCULAR CATHETERIZATION N/A 06/13/2015   Procedure: A/V Shuntogram/Fistulagram;  Surgeon: Algernon Huxley, MD;  Location: [place] CV LAB;  Service: Cardiovascular;  Laterality: N/A;  . PERIPHERAL VASCULAR CATHETERIZATION N/A 06/13/2015   Procedure: A/V Shunt Intervention;  Surgeon: Algernon Huxley, MD;  Location: [place] CV LAB;  Service: Cardiovascular;  Laterality: N/A;  . TEE WITHOUT CARDIOVERSION N/A 02/22/2016   Procedure: TRANSESOPHAGEAL ECHOCARDIOGRAM (TEE);  Surgeon: Pixie Casino, MD;  Location: [facility] [place];  Service: Cardiovascular;  Laterality: N/A;  . TEE WITHOUT CARDIOVERSION N/A 02/25/2016   Procedure: TRANSESOPHAGEAL ECHOCARDIOGRAM (TEE);  Surgeon: Melrose Nakayama, MD;  Location: [place];  Service: Open Heart Surgery;  Laterality: N/A;  . TEE WITHOUT CARDIOVERSION N/A 10/01/2016   Procedure: TRANSESOPHAGEAL ECHOCARDIOGRAM (TEE);  Surgeon: Pixie Casino, MD;  Location: [facility] ENDOSCOPY;  Service: Cardiovascular;  Laterality: N/A;  . TUBAL LIGATION  1983    There were no vitals filed for this visit.      [facility] PT Assessment - 04/11/17 0001      Observation/Other Assessments   Focus on Therapeutic Outcomes (FOTO)   44% limited      Flexibility   Hamstrings  RT limited 25%,  LT limited 40 %                  [facility] Adult PT Treatment/Exercise - 04/11/17 0001      Therapeutic Activites    Therapeutic  Activities  ADL's;Lifting    ADL's  Able to do correctly after cues.      Lifting  8 LB box waist, knee height,  carry and place on counter, on chair seat.  golfer's lift practiced,  modified squat practiced with elbow on thigh to assist with dryer ADL's  low drawers.       Lumbar Exercises: Stretches   Passive Hamstring Stretch  3 reps;30 seconds    Pelvic Tilt  5 reps cued breathing    Other Lumbar Stretch Exercise  gastroc stretch  3 x 30 each at wall,  HEP      Lumbar Exercises: Supine   Ab Set  5 reps    Bent Knee Raise  10 reps with abdominal bracing    Dead Bug  10 reps with abdominal bracing    Bridge  10 reps    Large Ball Abdominal Isometric  10 reps    Large Ball Oblique Isometric  10 reps each right and left             PT Education -  04/11/17 1618    Education provided  Yes    Education Details  HEP,  Lifting    Person(s) Educated  Patient    Methods  Demonstration;Verbal cues;Handout    Comprehension  Verbalized understanding;Returned demonstration          PT Long Term Goals - 04/11/17 1648      PT LONG TERM GOAL #1   Title  She will be independent with all HEP issued.     Baseline  independent    Time  4    Period  Weeks    Status  Achieved      PT LONG TERM GOAL #2   Title  She will report pain eased 50% with lifting and home tasks.     Baseline  Yes    Time  4    Period  Weeks    Status  Achieved      PT LONG TERM GOAL #3   Title  she will report no sleep disturbance due to back pain.     Baseline  Not waking due to back pain    Time  4    Period  Weeks    Status  Achieved      PT LONG TERM GOAL #4   Title  She will verbalize and demo understanding of good body mechanics    Baseline  tries to use with ADL's at home,  Able to demo correct techniques in clinic    Time  4    Period  Weeks    Status  Achieved      PT LONG TERM GOAL #5   Title  FOTO score improved to 40% limited or less    Baseline  44% limitation    Time  4     Period  Weeks    Status  Partially Met            Plan - 04/11/17 1643    Clinical Impression Statement  Patient continues to be painfree.  She is independent with her HEP.  She declined the need for more HEP like wall sits due to walking is her best way to exercise.  She is able to demo correct technique with lifting (see flow sheet ) and is following advice for body mechanics at home..  All goals met  Except LTG#5 which was partially met. .  She has no back pain  Hamstring length has improved , see flow sheet.  FOTO score improved to 44% limitation. (At intake 50 % limitation).    PT Next Visit Plan  Discharge to HEP patient feels she is ready.    PT Home Exercise Plan  PPT , knee to chest,  LTR, bridge, Calf stretch    Consulted and Agree with Plan of Care  Patient       Patient will benefit from skilled therapeutic intervention in order to improve the following deficits and impairments:     Visit Diagnosis: Chronic right-sided low back pain without sciatica  Muscle spasm of back  Abnormal posture     Problem List Patient Active Problem List   Diagnosis Date Noted  . S/P cholecystectomy 02/14/2017  . Cholelithiases 01/28/2017  . ESRD (end stage renal disease) ([place]) 01/03/2017  . Osteoporosis 12/11/2016  . Cholelithiasis 11/07/2016  . Cerebral embolism with transient ischemic attack (TIA)   . TIA (transient ischemic attack)   . AVD (aortic valve disease) 07/12/2016  . Pulmonary HTN ([place]) 07/12/2016  . Carpal tunnel syndrome 07/05/2016  .  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 CVA (cerebral vascular accident) ([place]) 05/16/2016  . History of aortic valve replacement with bioprosthetic valve 05/08/2016  . Aortic valve prosthesis present 02/25/2016  . Bacteremia due to coagulase-negative Staphylococcus   . S/P dialysis catheter insertion ([place])   . Neck pain   . Endocarditis   . Anemia   . Renal dialysis device, implant, or graft complication 37/10/6436  . Pain and  swelling of right upper extremity 12/20/2015  . Complex endometrial hyperplasia with atypia 11/08/2015  . Increased endometrial stripe thickness 11/03/2015  . Obesity 10/28/2014  . Health care maintenance 10/28/2014  . Right carotid bruit 01/08/2013  . Generalized headaches 01/08/2013  . Chronic female pelvic pain 08/08/2012  . Gout, unspecified 12/06/2008  . Esophageal reflux 12/06/2008  . Backache 12/06/2008  . FIBROIDS, UTERUS 11/24/2008  . ESRD (end stage renal disease) on dialysis ([place]) 07/01/2006    HARRIS,KAREN PTA 04/11/2017, 4:54 PM  [place] [facility] [address] [place], [place], [address] Phone: [phone]   Fax:  [phone]  Name: [name] Marks MRN: [id number] Date of Birth: 13-Apr-1955  PHYSICAL THERAPY DISCHARGE SUMMARY  Visits from Start of Care: 5  Current functional level related to goals / functional outcomes: See above   Remaining deficits: See above   Education / Equipment: HEP Plan: Patient agrees to discharge.  Patient goals were met. Patient is being discharged due to being pleased with the current functional level.  ?????    Noralee Stain Pt  04/11/17

## 2017-04-12 DIAGNOSIS — N2581 Secondary hyperparathyroidism of renal origin: Secondary | ICD-10-CM | POA: Diagnosis not present

## 2017-04-12 DIAGNOSIS — D631 Anemia in chronic kidney disease: Secondary | ICD-10-CM | POA: Diagnosis not present

## 2017-04-12 DIAGNOSIS — D509 Iron deficiency anemia, unspecified: Secondary | ICD-10-CM | POA: Diagnosis not present

## 2017-04-12 DIAGNOSIS — N186 End stage renal disease: Secondary | ICD-10-CM | POA: Diagnosis not present

## 2017-04-15 DIAGNOSIS — D631 Anemia in chronic kidney disease: Secondary | ICD-10-CM | POA: Diagnosis not present

## 2017-04-15 DIAGNOSIS — D509 Iron deficiency anemia, unspecified: Secondary | ICD-10-CM | POA: Diagnosis not present

## 2017-04-15 DIAGNOSIS — N186 End stage renal disease: Secondary | ICD-10-CM | POA: Diagnosis not present

## 2017-04-15 DIAGNOSIS — N2581 Secondary hyperparathyroidism of renal origin: Secondary | ICD-10-CM | POA: Diagnosis not present

## 2017-04-16 ENCOUNTER — Ambulatory Visit: Payer: Medicare Other | Admitting: Physical Therapy

## 2017-04-17 DIAGNOSIS — N2581 Secondary hyperparathyroidism of renal origin: Secondary | ICD-10-CM | POA: Diagnosis not present

## 2017-04-17 DIAGNOSIS — D631 Anemia in chronic kidney disease: Secondary | ICD-10-CM | POA: Diagnosis not present

## 2017-04-17 DIAGNOSIS — N186 End stage renal disease: Secondary | ICD-10-CM | POA: Diagnosis not present

## 2017-04-17 DIAGNOSIS — D509 Iron deficiency anemia, unspecified: Secondary | ICD-10-CM | POA: Diagnosis not present

## 2017-04-18 ENCOUNTER — Encounter: Payer: Medicare Other | Admitting: Physical Therapy

## 2017-04-19 DIAGNOSIS — N186 End stage renal disease: Secondary | ICD-10-CM | POA: Diagnosis not present

## 2017-04-19 DIAGNOSIS — D631 Anemia in chronic kidney disease: Secondary | ICD-10-CM | POA: Diagnosis not present

## 2017-04-19 DIAGNOSIS — D509 Iron deficiency anemia, unspecified: Secondary | ICD-10-CM | POA: Diagnosis not present

## 2017-04-19 DIAGNOSIS — N2581 Secondary hyperparathyroidism of renal origin: Secondary | ICD-10-CM | POA: Diagnosis not present

## 2017-04-22 DIAGNOSIS — N2581 Secondary hyperparathyroidism of renal origin: Secondary | ICD-10-CM | POA: Diagnosis not present

## 2017-04-22 DIAGNOSIS — N186 End stage renal disease: Secondary | ICD-10-CM | POA: Diagnosis not present

## 2017-04-22 DIAGNOSIS — D509 Iron deficiency anemia, unspecified: Secondary | ICD-10-CM | POA: Diagnosis not present

## 2017-04-22 DIAGNOSIS — D631 Anemia in chronic kidney disease: Secondary | ICD-10-CM | POA: Diagnosis not present

## 2017-04-24 DIAGNOSIS — D631 Anemia in chronic kidney disease: Secondary | ICD-10-CM | POA: Diagnosis not present

## 2017-04-24 DIAGNOSIS — N186 End stage renal disease: Secondary | ICD-10-CM | POA: Diagnosis not present

## 2017-04-24 DIAGNOSIS — D509 Iron deficiency anemia, unspecified: Secondary | ICD-10-CM | POA: Diagnosis not present

## 2017-04-24 DIAGNOSIS — N2581 Secondary hyperparathyroidism of renal origin: Secondary | ICD-10-CM | POA: Diagnosis not present

## 2017-04-26 DIAGNOSIS — N2581 Secondary hyperparathyroidism of renal origin: Secondary | ICD-10-CM | POA: Diagnosis not present

## 2017-04-26 DIAGNOSIS — D509 Iron deficiency anemia, unspecified: Secondary | ICD-10-CM | POA: Diagnosis not present

## 2017-04-26 DIAGNOSIS — Z992 Dependence on renal dialysis: Secondary | ICD-10-CM | POA: Diagnosis not present

## 2017-04-26 DIAGNOSIS — N186 End stage renal disease: Secondary | ICD-10-CM | POA: Diagnosis not present

## 2017-04-26 DIAGNOSIS — N039 Chronic nephritic syndrome with unspecified morphologic changes: Secondary | ICD-10-CM | POA: Diagnosis not present

## 2017-04-26 DIAGNOSIS — D631 Anemia in chronic kidney disease: Secondary | ICD-10-CM | POA: Diagnosis not present

## 2017-04-29 DIAGNOSIS — N186 End stage renal disease: Secondary | ICD-10-CM | POA: Diagnosis not present

## 2017-04-29 DIAGNOSIS — N2581 Secondary hyperparathyroidism of renal origin: Secondary | ICD-10-CM | POA: Diagnosis not present

## 2017-04-29 DIAGNOSIS — D509 Iron deficiency anemia, unspecified: Secondary | ICD-10-CM | POA: Diagnosis not present

## 2017-04-29 DIAGNOSIS — D631 Anemia in chronic kidney disease: Secondary | ICD-10-CM | POA: Diagnosis not present

## 2017-05-01 DIAGNOSIS — N186 End stage renal disease: Secondary | ICD-10-CM | POA: Diagnosis not present

## 2017-05-01 DIAGNOSIS — D631 Anemia in chronic kidney disease: Secondary | ICD-10-CM | POA: Diagnosis not present

## 2017-05-01 DIAGNOSIS — N2581 Secondary hyperparathyroidism of renal origin: Secondary | ICD-10-CM | POA: Diagnosis not present

## 2017-05-01 DIAGNOSIS — D509 Iron deficiency anemia, unspecified: Secondary | ICD-10-CM | POA: Diagnosis not present

## 2017-05-02 ENCOUNTER — Ambulatory Visit (INDEPENDENT_AMBULATORY_CARE_PROVIDER_SITE_OTHER): Payer: Medicare Other | Admitting: *Deleted

## 2017-05-02 DIAGNOSIS — I639 Cerebral infarction, unspecified: Secondary | ICD-10-CM | POA: Diagnosis not present

## 2017-05-03 DIAGNOSIS — D509 Iron deficiency anemia, unspecified: Secondary | ICD-10-CM | POA: Diagnosis not present

## 2017-05-03 DIAGNOSIS — N186 End stage renal disease: Secondary | ICD-10-CM | POA: Diagnosis not present

## 2017-05-03 DIAGNOSIS — D631 Anemia in chronic kidney disease: Secondary | ICD-10-CM | POA: Diagnosis not present

## 2017-05-03 DIAGNOSIS — N2581 Secondary hyperparathyroidism of renal origin: Secondary | ICD-10-CM | POA: Diagnosis not present

## 2017-05-03 NOTE — Progress Notes (Signed)
Carelink Summary Report / Loop Recorder 

## 2017-05-06 DIAGNOSIS — D509 Iron deficiency anemia, unspecified: Secondary | ICD-10-CM | POA: Diagnosis not present

## 2017-05-06 DIAGNOSIS — N186 End stage renal disease: Secondary | ICD-10-CM | POA: Diagnosis not present

## 2017-05-06 DIAGNOSIS — N2581 Secondary hyperparathyroidism of renal origin: Secondary | ICD-10-CM | POA: Diagnosis not present

## 2017-05-06 DIAGNOSIS — D631 Anemia in chronic kidney disease: Secondary | ICD-10-CM | POA: Diagnosis not present

## 2017-05-08 DIAGNOSIS — N186 End stage renal disease: Secondary | ICD-10-CM | POA: Diagnosis not present

## 2017-05-08 DIAGNOSIS — D631 Anemia in chronic kidney disease: Secondary | ICD-10-CM | POA: Diagnosis not present

## 2017-05-08 DIAGNOSIS — N2581 Secondary hyperparathyroidism of renal origin: Secondary | ICD-10-CM | POA: Diagnosis not present

## 2017-05-08 DIAGNOSIS — D509 Iron deficiency anemia, unspecified: Secondary | ICD-10-CM | POA: Diagnosis not present

## 2017-05-10 DIAGNOSIS — N2581 Secondary hyperparathyroidism of renal origin: Secondary | ICD-10-CM | POA: Diagnosis not present

## 2017-05-10 DIAGNOSIS — N186 End stage renal disease: Secondary | ICD-10-CM | POA: Diagnosis not present

## 2017-05-10 DIAGNOSIS — D631 Anemia in chronic kidney disease: Secondary | ICD-10-CM | POA: Diagnosis not present

## 2017-05-10 DIAGNOSIS — D509 Iron deficiency anemia, unspecified: Secondary | ICD-10-CM | POA: Diagnosis not present

## 2017-05-13 DIAGNOSIS — N2581 Secondary hyperparathyroidism of renal origin: Secondary | ICD-10-CM | POA: Diagnosis not present

## 2017-05-13 DIAGNOSIS — D631 Anemia in chronic kidney disease: Secondary | ICD-10-CM | POA: Diagnosis not present

## 2017-05-13 DIAGNOSIS — D509 Iron deficiency anemia, unspecified: Secondary | ICD-10-CM | POA: Diagnosis not present

## 2017-05-13 DIAGNOSIS — N186 End stage renal disease: Secondary | ICD-10-CM | POA: Diagnosis not present

## 2017-05-15 DIAGNOSIS — D509 Iron deficiency anemia, unspecified: Secondary | ICD-10-CM | POA: Diagnosis not present

## 2017-05-15 DIAGNOSIS — N186 End stage renal disease: Secondary | ICD-10-CM | POA: Diagnosis not present

## 2017-05-15 DIAGNOSIS — N2581 Secondary hyperparathyroidism of renal origin: Secondary | ICD-10-CM | POA: Diagnosis not present

## 2017-05-15 DIAGNOSIS — D631 Anemia in chronic kidney disease: Secondary | ICD-10-CM | POA: Diagnosis not present

## 2017-05-17 DIAGNOSIS — N2581 Secondary hyperparathyroidism of renal origin: Secondary | ICD-10-CM | POA: Diagnosis not present

## 2017-05-17 DIAGNOSIS — D509 Iron deficiency anemia, unspecified: Secondary | ICD-10-CM | POA: Diagnosis not present

## 2017-05-17 DIAGNOSIS — D631 Anemia in chronic kidney disease: Secondary | ICD-10-CM | POA: Diagnosis not present

## 2017-05-17 DIAGNOSIS — N186 End stage renal disease: Secondary | ICD-10-CM | POA: Diagnosis not present

## 2017-05-20 DIAGNOSIS — D509 Iron deficiency anemia, unspecified: Secondary | ICD-10-CM | POA: Diagnosis not present

## 2017-05-20 DIAGNOSIS — D631 Anemia in chronic kidney disease: Secondary | ICD-10-CM | POA: Diagnosis not present

## 2017-05-20 DIAGNOSIS — N186 End stage renal disease: Secondary | ICD-10-CM | POA: Diagnosis not present

## 2017-05-20 DIAGNOSIS — N2581 Secondary hyperparathyroidism of renal origin: Secondary | ICD-10-CM | POA: Diagnosis not present

## 2017-05-22 DIAGNOSIS — N2581 Secondary hyperparathyroidism of renal origin: Secondary | ICD-10-CM | POA: Diagnosis not present

## 2017-05-22 DIAGNOSIS — D631 Anemia in chronic kidney disease: Secondary | ICD-10-CM | POA: Diagnosis not present

## 2017-05-22 DIAGNOSIS — N186 End stage renal disease: Secondary | ICD-10-CM | POA: Diagnosis not present

## 2017-05-22 DIAGNOSIS — D509 Iron deficiency anemia, unspecified: Secondary | ICD-10-CM | POA: Diagnosis not present

## 2017-05-24 DIAGNOSIS — D631 Anemia in chronic kidney disease: Secondary | ICD-10-CM | POA: Diagnosis not present

## 2017-05-24 DIAGNOSIS — N2581 Secondary hyperparathyroidism of renal origin: Secondary | ICD-10-CM | POA: Diagnosis not present

## 2017-05-24 DIAGNOSIS — D509 Iron deficiency anemia, unspecified: Secondary | ICD-10-CM | POA: Diagnosis not present

## 2017-05-24 DIAGNOSIS — N186 End stage renal disease: Secondary | ICD-10-CM | POA: Diagnosis not present

## 2017-05-27 DIAGNOSIS — D631 Anemia in chronic kidney disease: Secondary | ICD-10-CM | POA: Diagnosis not present

## 2017-05-27 DIAGNOSIS — D509 Iron deficiency anemia, unspecified: Secondary | ICD-10-CM | POA: Diagnosis not present

## 2017-05-27 DIAGNOSIS — Z992 Dependence on renal dialysis: Secondary | ICD-10-CM | POA: Diagnosis not present

## 2017-05-27 DIAGNOSIS — N2581 Secondary hyperparathyroidism of renal origin: Secondary | ICD-10-CM | POA: Diagnosis not present

## 2017-05-27 DIAGNOSIS — N186 End stage renal disease: Secondary | ICD-10-CM | POA: Diagnosis not present

## 2017-05-27 DIAGNOSIS — N039 Chronic nephritic syndrome with unspecified morphologic changes: Secondary | ICD-10-CM | POA: Diagnosis not present

## 2017-05-29 DIAGNOSIS — N186 End stage renal disease: Secondary | ICD-10-CM | POA: Diagnosis not present

## 2017-05-29 DIAGNOSIS — N2581 Secondary hyperparathyroidism of renal origin: Secondary | ICD-10-CM | POA: Diagnosis not present

## 2017-05-29 DIAGNOSIS — D631 Anemia in chronic kidney disease: Secondary | ICD-10-CM | POA: Diagnosis not present

## 2017-05-29 DIAGNOSIS — D509 Iron deficiency anemia, unspecified: Secondary | ICD-10-CM | POA: Diagnosis not present

## 2017-05-31 DIAGNOSIS — D631 Anemia in chronic kidney disease: Secondary | ICD-10-CM | POA: Diagnosis not present

## 2017-05-31 DIAGNOSIS — N2581 Secondary hyperparathyroidism of renal origin: Secondary | ICD-10-CM | POA: Diagnosis not present

## 2017-05-31 DIAGNOSIS — N186 End stage renal disease: Secondary | ICD-10-CM | POA: Diagnosis not present

## 2017-05-31 DIAGNOSIS — D509 Iron deficiency anemia, unspecified: Secondary | ICD-10-CM | POA: Diagnosis not present

## 2017-06-03 DIAGNOSIS — N186 End stage renal disease: Secondary | ICD-10-CM | POA: Diagnosis not present

## 2017-06-03 DIAGNOSIS — N2581 Secondary hyperparathyroidism of renal origin: Secondary | ICD-10-CM | POA: Diagnosis not present

## 2017-06-03 DIAGNOSIS — D631 Anemia in chronic kidney disease: Secondary | ICD-10-CM | POA: Diagnosis not present

## 2017-06-03 DIAGNOSIS — D509 Iron deficiency anemia, unspecified: Secondary | ICD-10-CM | POA: Diagnosis not present

## 2017-06-04 ENCOUNTER — Ambulatory Visit (INDEPENDENT_AMBULATORY_CARE_PROVIDER_SITE_OTHER): Payer: Medicare Other | Admitting: *Deleted

## 2017-06-04 DIAGNOSIS — I639 Cerebral infarction, unspecified: Secondary | ICD-10-CM

## 2017-06-05 DIAGNOSIS — N2581 Secondary hyperparathyroidism of renal origin: Secondary | ICD-10-CM | POA: Diagnosis not present

## 2017-06-05 DIAGNOSIS — D631 Anemia in chronic kidney disease: Secondary | ICD-10-CM | POA: Diagnosis not present

## 2017-06-05 DIAGNOSIS — D509 Iron deficiency anemia, unspecified: Secondary | ICD-10-CM | POA: Diagnosis not present

## 2017-06-05 DIAGNOSIS — N186 End stage renal disease: Secondary | ICD-10-CM | POA: Diagnosis not present

## 2017-06-05 NOTE — Progress Notes (Signed)
Carelink Summary Report / Loop Recorder 

## 2017-06-07 DIAGNOSIS — D509 Iron deficiency anemia, unspecified: Secondary | ICD-10-CM | POA: Diagnosis not present

## 2017-06-07 DIAGNOSIS — N186 End stage renal disease: Secondary | ICD-10-CM | POA: Diagnosis not present

## 2017-06-07 DIAGNOSIS — D631 Anemia in chronic kidney disease: Secondary | ICD-10-CM | POA: Diagnosis not present

## 2017-06-07 DIAGNOSIS — N2581 Secondary hyperparathyroidism of renal origin: Secondary | ICD-10-CM | POA: Diagnosis not present

## 2017-06-10 DIAGNOSIS — N2581 Secondary hyperparathyroidism of renal origin: Secondary | ICD-10-CM | POA: Diagnosis not present

## 2017-06-10 DIAGNOSIS — N186 End stage renal disease: Secondary | ICD-10-CM | POA: Diagnosis not present

## 2017-06-10 DIAGNOSIS — D509 Iron deficiency anemia, unspecified: Secondary | ICD-10-CM | POA: Diagnosis not present

## 2017-06-10 DIAGNOSIS — D631 Anemia in chronic kidney disease: Secondary | ICD-10-CM | POA: Diagnosis not present

## 2017-06-12 DIAGNOSIS — D509 Iron deficiency anemia, unspecified: Secondary | ICD-10-CM | POA: Diagnosis not present

## 2017-06-12 DIAGNOSIS — N2581 Secondary hyperparathyroidism of renal origin: Secondary | ICD-10-CM | POA: Diagnosis not present

## 2017-06-12 DIAGNOSIS — D631 Anemia in chronic kidney disease: Secondary | ICD-10-CM | POA: Diagnosis not present

## 2017-06-12 DIAGNOSIS — N186 End stage renal disease: Secondary | ICD-10-CM | POA: Diagnosis not present

## 2017-06-13 LAB — CUP PACEART REMOTE DEVICE CHECK
Implantable Pulse Generator Implant Date: 20180806
MDC IDC SESS DTM: 20190307231020

## 2017-06-14 DIAGNOSIS — N2581 Secondary hyperparathyroidism of renal origin: Secondary | ICD-10-CM | POA: Diagnosis not present

## 2017-06-14 DIAGNOSIS — N186 End stage renal disease: Secondary | ICD-10-CM | POA: Diagnosis not present

## 2017-06-14 DIAGNOSIS — D631 Anemia in chronic kidney disease: Secondary | ICD-10-CM | POA: Diagnosis not present

## 2017-06-14 DIAGNOSIS — D509 Iron deficiency anemia, unspecified: Secondary | ICD-10-CM | POA: Diagnosis not present

## 2017-06-17 DIAGNOSIS — D631 Anemia in chronic kidney disease: Secondary | ICD-10-CM | POA: Diagnosis not present

## 2017-06-17 DIAGNOSIS — N2581 Secondary hyperparathyroidism of renal origin: Secondary | ICD-10-CM | POA: Diagnosis not present

## 2017-06-17 DIAGNOSIS — D509 Iron deficiency anemia, unspecified: Secondary | ICD-10-CM | POA: Diagnosis not present

## 2017-06-17 DIAGNOSIS — N186 End stage renal disease: Secondary | ICD-10-CM | POA: Diagnosis not present

## 2017-06-19 DIAGNOSIS — D631 Anemia in chronic kidney disease: Secondary | ICD-10-CM | POA: Diagnosis not present

## 2017-06-19 DIAGNOSIS — N2581 Secondary hyperparathyroidism of renal origin: Secondary | ICD-10-CM | POA: Diagnosis not present

## 2017-06-19 DIAGNOSIS — N186 End stage renal disease: Secondary | ICD-10-CM | POA: Diagnosis not present

## 2017-06-19 DIAGNOSIS — D509 Iron deficiency anemia, unspecified: Secondary | ICD-10-CM | POA: Diagnosis not present

## 2017-06-21 DIAGNOSIS — N186 End stage renal disease: Secondary | ICD-10-CM | POA: Diagnosis not present

## 2017-06-21 DIAGNOSIS — D631 Anemia in chronic kidney disease: Secondary | ICD-10-CM | POA: Diagnosis not present

## 2017-06-21 DIAGNOSIS — D509 Iron deficiency anemia, unspecified: Secondary | ICD-10-CM | POA: Diagnosis not present

## 2017-06-21 DIAGNOSIS — N2581 Secondary hyperparathyroidism of renal origin: Secondary | ICD-10-CM | POA: Diagnosis not present

## 2017-06-24 DIAGNOSIS — D509 Iron deficiency anemia, unspecified: Secondary | ICD-10-CM | POA: Diagnosis not present

## 2017-06-24 DIAGNOSIS — D631 Anemia in chronic kidney disease: Secondary | ICD-10-CM | POA: Diagnosis not present

## 2017-06-24 DIAGNOSIS — N2581 Secondary hyperparathyroidism of renal origin: Secondary | ICD-10-CM | POA: Diagnosis not present

## 2017-06-24 DIAGNOSIS — N186 End stage renal disease: Secondary | ICD-10-CM | POA: Diagnosis not present

## 2017-06-26 DIAGNOSIS — N039 Chronic nephritic syndrome with unspecified morphologic changes: Secondary | ICD-10-CM | POA: Diagnosis not present

## 2017-06-26 DIAGNOSIS — Z992 Dependence on renal dialysis: Secondary | ICD-10-CM | POA: Diagnosis not present

## 2017-06-26 DIAGNOSIS — D509 Iron deficiency anemia, unspecified: Secondary | ICD-10-CM | POA: Diagnosis not present

## 2017-06-26 DIAGNOSIS — N2581 Secondary hyperparathyroidism of renal origin: Secondary | ICD-10-CM | POA: Diagnosis not present

## 2017-06-26 DIAGNOSIS — D631 Anemia in chronic kidney disease: Secondary | ICD-10-CM | POA: Diagnosis not present

## 2017-06-26 DIAGNOSIS — N186 End stage renal disease: Secondary | ICD-10-CM | POA: Diagnosis not present

## 2017-06-28 DIAGNOSIS — N186 End stage renal disease: Secondary | ICD-10-CM | POA: Diagnosis not present

## 2017-06-28 DIAGNOSIS — N2581 Secondary hyperparathyroidism of renal origin: Secondary | ICD-10-CM | POA: Diagnosis not present

## 2017-06-28 DIAGNOSIS — D509 Iron deficiency anemia, unspecified: Secondary | ICD-10-CM | POA: Diagnosis not present

## 2017-06-28 DIAGNOSIS — D631 Anemia in chronic kidney disease: Secondary | ICD-10-CM | POA: Diagnosis not present

## 2017-07-01 DIAGNOSIS — D509 Iron deficiency anemia, unspecified: Secondary | ICD-10-CM | POA: Diagnosis not present

## 2017-07-01 DIAGNOSIS — N2581 Secondary hyperparathyroidism of renal origin: Secondary | ICD-10-CM | POA: Diagnosis not present

## 2017-07-01 DIAGNOSIS — N186 End stage renal disease: Secondary | ICD-10-CM | POA: Diagnosis not present

## 2017-07-01 DIAGNOSIS — D631 Anemia in chronic kidney disease: Secondary | ICD-10-CM | POA: Diagnosis not present

## 2017-07-03 DIAGNOSIS — D509 Iron deficiency anemia, unspecified: Secondary | ICD-10-CM | POA: Diagnosis not present

## 2017-07-03 DIAGNOSIS — N186 End stage renal disease: Secondary | ICD-10-CM | POA: Diagnosis not present

## 2017-07-03 DIAGNOSIS — N2581 Secondary hyperparathyroidism of renal origin: Secondary | ICD-10-CM | POA: Diagnosis not present

## 2017-07-03 DIAGNOSIS — D631 Anemia in chronic kidney disease: Secondary | ICD-10-CM | POA: Diagnosis not present

## 2017-07-05 ENCOUNTER — Other Ambulatory Visit: Payer: Self-pay | Admitting: Family Medicine

## 2017-07-05 DIAGNOSIS — Z1231 Encounter for screening mammogram for malignant neoplasm of breast: Secondary | ICD-10-CM

## 2017-07-05 DIAGNOSIS — D631 Anemia in chronic kidney disease: Secondary | ICD-10-CM | POA: Diagnosis not present

## 2017-07-05 DIAGNOSIS — D509 Iron deficiency anemia, unspecified: Secondary | ICD-10-CM | POA: Diagnosis not present

## 2017-07-05 DIAGNOSIS — N186 End stage renal disease: Secondary | ICD-10-CM | POA: Diagnosis not present

## 2017-07-05 DIAGNOSIS — N2581 Secondary hyperparathyroidism of renal origin: Secondary | ICD-10-CM | POA: Diagnosis not present

## 2017-07-08 ENCOUNTER — Ambulatory Visit (INDEPENDENT_AMBULATORY_CARE_PROVIDER_SITE_OTHER): Payer: Medicare Other | Admitting: *Deleted

## 2017-07-08 DIAGNOSIS — N2581 Secondary hyperparathyroidism of renal origin: Secondary | ICD-10-CM | POA: Diagnosis not present

## 2017-07-08 DIAGNOSIS — I639 Cerebral infarction, unspecified: Secondary | ICD-10-CM

## 2017-07-08 DIAGNOSIS — D509 Iron deficiency anemia, unspecified: Secondary | ICD-10-CM | POA: Diagnosis not present

## 2017-07-08 DIAGNOSIS — D631 Anemia in chronic kidney disease: Secondary | ICD-10-CM | POA: Diagnosis not present

## 2017-07-08 DIAGNOSIS — N186 End stage renal disease: Secondary | ICD-10-CM | POA: Diagnosis not present

## 2017-07-08 LAB — CUP PACEART REMOTE DEVICE CHECK
Implantable Pulse Generator Implant Date: 20180806
MDC IDC SESS DTM: 20190409233956

## 2017-07-09 ENCOUNTER — Encounter: Payer: Self-pay | Admitting: Cardiology

## 2017-07-09 NOTE — Progress Notes (Signed)
Carelink Summary Report / Loop Recorder 

## 2017-07-10 DIAGNOSIS — D509 Iron deficiency anemia, unspecified: Secondary | ICD-10-CM | POA: Diagnosis not present

## 2017-07-10 DIAGNOSIS — N186 End stage renal disease: Secondary | ICD-10-CM | POA: Diagnosis not present

## 2017-07-10 DIAGNOSIS — D631 Anemia in chronic kidney disease: Secondary | ICD-10-CM | POA: Diagnosis not present

## 2017-07-10 DIAGNOSIS — N2581 Secondary hyperparathyroidism of renal origin: Secondary | ICD-10-CM | POA: Diagnosis not present

## 2017-07-12 DIAGNOSIS — N186 End stage renal disease: Secondary | ICD-10-CM | POA: Diagnosis not present

## 2017-07-12 DIAGNOSIS — N2581 Secondary hyperparathyroidism of renal origin: Secondary | ICD-10-CM | POA: Diagnosis not present

## 2017-07-12 DIAGNOSIS — D631 Anemia in chronic kidney disease: Secondary | ICD-10-CM | POA: Diagnosis not present

## 2017-07-12 DIAGNOSIS — D509 Iron deficiency anemia, unspecified: Secondary | ICD-10-CM | POA: Diagnosis not present

## 2017-07-15 DIAGNOSIS — D509 Iron deficiency anemia, unspecified: Secondary | ICD-10-CM | POA: Diagnosis not present

## 2017-07-15 DIAGNOSIS — N2581 Secondary hyperparathyroidism of renal origin: Secondary | ICD-10-CM | POA: Diagnosis not present

## 2017-07-15 DIAGNOSIS — D631 Anemia in chronic kidney disease: Secondary | ICD-10-CM | POA: Diagnosis not present

## 2017-07-15 DIAGNOSIS — N186 End stage renal disease: Secondary | ICD-10-CM | POA: Diagnosis not present

## 2017-07-17 DIAGNOSIS — D631 Anemia in chronic kidney disease: Secondary | ICD-10-CM | POA: Diagnosis not present

## 2017-07-17 DIAGNOSIS — N186 End stage renal disease: Secondary | ICD-10-CM | POA: Diagnosis not present

## 2017-07-17 DIAGNOSIS — N2581 Secondary hyperparathyroidism of renal origin: Secondary | ICD-10-CM | POA: Diagnosis not present

## 2017-07-17 DIAGNOSIS — D509 Iron deficiency anemia, unspecified: Secondary | ICD-10-CM | POA: Diagnosis not present

## 2017-07-19 DIAGNOSIS — D509 Iron deficiency anemia, unspecified: Secondary | ICD-10-CM | POA: Diagnosis not present

## 2017-07-19 DIAGNOSIS — N2581 Secondary hyperparathyroidism of renal origin: Secondary | ICD-10-CM | POA: Diagnosis not present

## 2017-07-19 DIAGNOSIS — D631 Anemia in chronic kidney disease: Secondary | ICD-10-CM | POA: Diagnosis not present

## 2017-07-19 DIAGNOSIS — N186 End stage renal disease: Secondary | ICD-10-CM | POA: Diagnosis not present

## 2017-07-22 DIAGNOSIS — D509 Iron deficiency anemia, unspecified: Secondary | ICD-10-CM | POA: Diagnosis not present

## 2017-07-22 DIAGNOSIS — D631 Anemia in chronic kidney disease: Secondary | ICD-10-CM | POA: Diagnosis not present

## 2017-07-22 DIAGNOSIS — N2581 Secondary hyperparathyroidism of renal origin: Secondary | ICD-10-CM | POA: Diagnosis not present

## 2017-07-22 DIAGNOSIS — N186 End stage renal disease: Secondary | ICD-10-CM | POA: Diagnosis not present

## 2017-07-24 DIAGNOSIS — N2581 Secondary hyperparathyroidism of renal origin: Secondary | ICD-10-CM | POA: Diagnosis not present

## 2017-07-24 DIAGNOSIS — D631 Anemia in chronic kidney disease: Secondary | ICD-10-CM | POA: Diagnosis not present

## 2017-07-24 DIAGNOSIS — N186 End stage renal disease: Secondary | ICD-10-CM | POA: Diagnosis not present

## 2017-07-24 DIAGNOSIS — D509 Iron deficiency anemia, unspecified: Secondary | ICD-10-CM | POA: Diagnosis not present

## 2017-07-26 DIAGNOSIS — N2581 Secondary hyperparathyroidism of renal origin: Secondary | ICD-10-CM | POA: Diagnosis not present

## 2017-07-26 DIAGNOSIS — D631 Anemia in chronic kidney disease: Secondary | ICD-10-CM | POA: Diagnosis not present

## 2017-07-26 DIAGNOSIS — N186 End stage renal disease: Secondary | ICD-10-CM | POA: Diagnosis not present

## 2017-07-26 DIAGNOSIS — D509 Iron deficiency anemia, unspecified: Secondary | ICD-10-CM | POA: Diagnosis not present

## 2017-07-27 DIAGNOSIS — N186 End stage renal disease: Secondary | ICD-10-CM | POA: Diagnosis not present

## 2017-07-27 DIAGNOSIS — N039 Chronic nephritic syndrome with unspecified morphologic changes: Secondary | ICD-10-CM | POA: Diagnosis not present

## 2017-07-27 DIAGNOSIS — Z992 Dependence on renal dialysis: Secondary | ICD-10-CM | POA: Diagnosis not present

## 2017-07-29 DIAGNOSIS — N2581 Secondary hyperparathyroidism of renal origin: Secondary | ICD-10-CM | POA: Diagnosis not present

## 2017-07-29 DIAGNOSIS — D509 Iron deficiency anemia, unspecified: Secondary | ICD-10-CM | POA: Diagnosis not present

## 2017-07-29 DIAGNOSIS — N186 End stage renal disease: Secondary | ICD-10-CM | POA: Diagnosis not present

## 2017-07-29 DIAGNOSIS — D631 Anemia in chronic kidney disease: Secondary | ICD-10-CM | POA: Diagnosis not present

## 2017-07-31 DIAGNOSIS — D631 Anemia in chronic kidney disease: Secondary | ICD-10-CM | POA: Diagnosis not present

## 2017-07-31 DIAGNOSIS — N186 End stage renal disease: Secondary | ICD-10-CM | POA: Diagnosis not present

## 2017-07-31 DIAGNOSIS — D509 Iron deficiency anemia, unspecified: Secondary | ICD-10-CM | POA: Diagnosis not present

## 2017-07-31 DIAGNOSIS — N2581 Secondary hyperparathyroidism of renal origin: Secondary | ICD-10-CM | POA: Diagnosis not present

## 2017-07-31 LAB — CUP PACEART REMOTE DEVICE CHECK
Date Time Interrogation Session: 20190513001033
Implantable Pulse Generator Implant Date: 20180806

## 2017-08-02 DIAGNOSIS — N186 End stage renal disease: Secondary | ICD-10-CM | POA: Diagnosis not present

## 2017-08-02 DIAGNOSIS — N2581 Secondary hyperparathyroidism of renal origin: Secondary | ICD-10-CM | POA: Diagnosis not present

## 2017-08-02 DIAGNOSIS — D509 Iron deficiency anemia, unspecified: Secondary | ICD-10-CM | POA: Diagnosis not present

## 2017-08-02 DIAGNOSIS — D631 Anemia in chronic kidney disease: Secondary | ICD-10-CM | POA: Diagnosis not present

## 2017-08-05 DIAGNOSIS — D509 Iron deficiency anemia, unspecified: Secondary | ICD-10-CM | POA: Diagnosis not present

## 2017-08-05 DIAGNOSIS — N186 End stage renal disease: Secondary | ICD-10-CM | POA: Diagnosis not present

## 2017-08-05 DIAGNOSIS — N2581 Secondary hyperparathyroidism of renal origin: Secondary | ICD-10-CM | POA: Diagnosis not present

## 2017-08-05 DIAGNOSIS — D631 Anemia in chronic kidney disease: Secondary | ICD-10-CM | POA: Diagnosis not present

## 2017-08-07 DIAGNOSIS — N186 End stage renal disease: Secondary | ICD-10-CM | POA: Diagnosis not present

## 2017-08-07 DIAGNOSIS — D509 Iron deficiency anemia, unspecified: Secondary | ICD-10-CM | POA: Diagnosis not present

## 2017-08-07 DIAGNOSIS — D631 Anemia in chronic kidney disease: Secondary | ICD-10-CM | POA: Diagnosis not present

## 2017-08-07 DIAGNOSIS — N2581 Secondary hyperparathyroidism of renal origin: Secondary | ICD-10-CM | POA: Diagnosis not present

## 2017-08-09 ENCOUNTER — Ambulatory Visit (INDEPENDENT_AMBULATORY_CARE_PROVIDER_SITE_OTHER): Payer: Medicare Other | Admitting: *Deleted

## 2017-08-09 DIAGNOSIS — N186 End stage renal disease: Secondary | ICD-10-CM | POA: Diagnosis not present

## 2017-08-09 DIAGNOSIS — N2581 Secondary hyperparathyroidism of renal origin: Secondary | ICD-10-CM | POA: Diagnosis not present

## 2017-08-09 DIAGNOSIS — I639 Cerebral infarction, unspecified: Secondary | ICD-10-CM | POA: Diagnosis not present

## 2017-08-09 DIAGNOSIS — D509 Iron deficiency anemia, unspecified: Secondary | ICD-10-CM | POA: Diagnosis not present

## 2017-08-09 DIAGNOSIS — D631 Anemia in chronic kidney disease: Secondary | ICD-10-CM | POA: Diagnosis not present

## 2017-08-12 DIAGNOSIS — D631 Anemia in chronic kidney disease: Secondary | ICD-10-CM | POA: Diagnosis not present

## 2017-08-12 DIAGNOSIS — N2581 Secondary hyperparathyroidism of renal origin: Secondary | ICD-10-CM | POA: Diagnosis not present

## 2017-08-12 DIAGNOSIS — D509 Iron deficiency anemia, unspecified: Secondary | ICD-10-CM | POA: Diagnosis not present

## 2017-08-12 DIAGNOSIS — N186 End stage renal disease: Secondary | ICD-10-CM | POA: Diagnosis not present

## 2017-08-12 NOTE — Progress Notes (Signed)
Carelink Summary Report / Loop Recorder 

## 2017-08-13 ENCOUNTER — Ambulatory Visit
Admission: RE | Admit: 2017-08-13 | Discharge: 2017-08-13 | Disposition: A | Discharge status: Home / Self Care | Payer: Medicare Other | Source: Ambulatory Visit | Attending: Family Medicine | Admitting: Family Medicine

## 2017-08-13 DIAGNOSIS — Z1231 Encounter for screening mammogram for malignant neoplasm of breast: Secondary | ICD-10-CM | POA: Diagnosis not present

## 2017-08-14 DIAGNOSIS — D631 Anemia in chronic kidney disease: Secondary | ICD-10-CM | POA: Diagnosis not present

## 2017-08-14 DIAGNOSIS — N2581 Secondary hyperparathyroidism of renal origin: Secondary | ICD-10-CM | POA: Diagnosis not present

## 2017-08-14 DIAGNOSIS — D509 Iron deficiency anemia, unspecified: Secondary | ICD-10-CM | POA: Diagnosis not present

## 2017-08-14 DIAGNOSIS — N186 End stage renal disease: Secondary | ICD-10-CM | POA: Diagnosis not present

## 2017-08-16 DIAGNOSIS — N186 End stage renal disease: Secondary | ICD-10-CM | POA: Diagnosis not present

## 2017-08-16 DIAGNOSIS — D631 Anemia in chronic kidney disease: Secondary | ICD-10-CM | POA: Diagnosis not present

## 2017-08-16 DIAGNOSIS — N2581 Secondary hyperparathyroidism of renal origin: Secondary | ICD-10-CM | POA: Diagnosis not present

## 2017-08-16 DIAGNOSIS — D509 Iron deficiency anemia, unspecified: Secondary | ICD-10-CM | POA: Diagnosis not present

## 2017-08-19 DIAGNOSIS — D509 Iron deficiency anemia, unspecified: Secondary | ICD-10-CM | POA: Diagnosis not present

## 2017-08-19 DIAGNOSIS — N186 End stage renal disease: Secondary | ICD-10-CM | POA: Diagnosis not present

## 2017-08-19 DIAGNOSIS — N2581 Secondary hyperparathyroidism of renal origin: Secondary | ICD-10-CM | POA: Diagnosis not present

## 2017-08-19 DIAGNOSIS — D631 Anemia in chronic kidney disease: Secondary | ICD-10-CM | POA: Diagnosis not present

## 2017-08-20 ENCOUNTER — Encounter: Payer: Self-pay | Admitting: Cardiology

## 2017-08-20 ENCOUNTER — Ambulatory Visit (INDEPENDENT_AMBULATORY_CARE_PROVIDER_SITE_OTHER): Payer: Medicare Other | Admitting: Cardiology

## 2017-08-20 VITALS — BP 104/58 | HR 63 | Ht 60.0 in | Wt 164.2 lb

## 2017-08-20 DIAGNOSIS — I359 Nonrheumatic aortic valve disorder, unspecified: Secondary | ICD-10-CM | POA: Diagnosis not present

## 2017-08-20 DIAGNOSIS — I639 Cerebral infarction, unspecified: Secondary | ICD-10-CM

## 2017-08-20 DIAGNOSIS — Z8679 Personal history of other diseases of the circulatory system: Secondary | ICD-10-CM | POA: Diagnosis not present

## 2017-08-20 DIAGNOSIS — I272 Pulmonary hypertension, unspecified: Secondary | ICD-10-CM | POA: Diagnosis not present

## 2017-08-20 NOTE — Patient Instructions (Signed)
Medication Instructions:  Your physician recommends that you continue on your current medications as directed. Please refer to the Current Medication list given to you today.  If you need a refill on your cardiac medications, please contact your pharmacy first.  Labwork: None ordered   Testing/Procedures: None ordered   Follow-Up: Your physician wants you to follow-up in: 1 year with Dr. Turner. You will receive a reminder letter in the mail two months in advance. If you don't receive a letter, please call our office to schedule the follow-up appointment.  Any Other Special Instructions Will Be Listed Below (If Applicable).   Thank you for choosing [facility]    Rena Jazma Pickel, RN  [phone]  If you need a refill on your cardiac medications before your next appointment, please call your pharmacy.   

## 2017-08-20 NOTE — Progress Notes (Signed)
Cardiology Office Note:    Date:  08/20/2017   ID:  [name] Marks, DOB 04-02-1955, MRN [id number]  PCP:  Tonette Bihari, MD  Cardiologist:  No primary care provider on file.    Referring MD: Tonette Bihari, MD   Chief Complaint  Patient presents with  . Follow-up    AV disease, pulmonary HTN    History of Present Illness:    [name] Marks is a 62 y.o. 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  This was complicated by severe aortic insufficiency.  She underwent cardiac cath showing normal coronary arteries and underwent bioprosthetic AVR.  Unfortunately she subsequently had a TIA in March 2018.  She has known moderate TR and moderate pulmonary hypertension with last PASP 47 mmHg 02/29/2016.  She is here today for followup and is doing well.  She denies any chest pain or pressure, SOB, DOE, PND, orthopnea, LE edema, dizziness, palpitations or syncope. She is compliant with her meds and is tolerating meds with no SE.    Past Medical History:  Diagnosis Date  . Anemia   . Aortic valve prosthesis present 02/25/2016  . Arthritis    "knees" (01/03/2017)  . AVD (aortic valve disease) 07/12/2016  . Backache 12/06/2008  . Bacteremia due to coagulase-negative Staphylococcus   . Carpal tunnel syndrome   . Cerebral embolism with transient ischemic attack (TIA)   . Cholelithiases 01/28/2017  . Chronic female pelvic pain 08/08/2012  . Complication of anesthesia    01/01/17- '"a long time ago, difficulty breathimg, not sure if it was due to anesthesia or not."  . CVA (cerebral vascular accident) ([place]) 05/16/2016  . End stage renal disease on dialysis ([place])    "MWF; [place]." (01/03/2017)  . Endocarditis   . Esophageal reflux 12/06/2008  . GERD (gastroesophageal reflux disease)   . Gout   . History of blood transfusion 2017   "related to blood poison" (01/03/2017)  .  Increased endometrial stripe thickness 11/03/2015  . Neck pain   . Osteoporosis 09/2016   T score -2.6  . Pain and swelling of right upper extremity 12/20/2015  . Pulmonary HTN ([place]) 07/12/2016  . Renal dialysis device, implant, or graft complication 78/29/5621  . Renal insufficiency   . S/P cholecystectomy 02/14/2017  . Septic shock ([place])   . Staphylococcus aureus bacteremia   . TIA (transient ischemic attack)    "several" (01/03/2017)    Past Surgical History:  Procedure Laterality Date  . AORTIC VALVE REPLACEMENT N/A 02/25/2016   Procedure: AORTIC VALVE REPLACEMENT (AVR) implanted with Magna Ease Aortic valve size 77mm;  Surgeon: Melrose Nakayama, MD;  Location: [place];  Service: Open Heart Surgery;  Laterality: N/A;  . AV FISTULA PLACEMENT Left 01/03/2017   Procedure: INSERTION OF ARTERIOVENOUS (AV) GORE-TEX GRAFT LEFT THIGH;  Surgeon: Serafina Mitchell, MD;  Location: [place];  Service: Vascular;  Laterality: Left;  . [place] REMOVAL Right 02/17/2016   Procedure: REMOVAL OF TWO ARTERIOVENOUS GORETEX GRAFTS ([place]);  Surgeon: Angelia Mould, MD;  Location: [place];  Service: Vascular;  Laterality: Right;  . BREAST BIOPSY Left 2013   stereo   . BREAST BIOPSY Right 2011   stereo   . CARDIAC VALVE REPLACEMENT    . CHOLECYSTECTOMY N/A 01/30/2017   Procedure: LAPAROSCOPIC CHOLECYSTECTOMY;  Surgeon: Erroll Luna, MD;  Location: [place];  Service: General;  Laterality: N/A;  .  COLONOSCOPY W/ POLYPECTOMY    . DG AV DIALYSIS GRAFT DECLOT OR    . DILATATION & CURETTAGE/HYSTEROSCOPY WITH TRUECLEAR N/A 11/06/2012   Procedure: DILATATION & CURETTAGE/HYSTEROSCOPY WITH TRUECLEAR;  Surgeon: Terrance Mass, MD;  Location: [place] [facility];  Service: Gynecology;  Laterality: N/A;  Truclear Resectoscopic Polypectomy   . INSERTION OF DIALYSIS CATHETER N/A 02/19/2016   Procedure: INSERTION OF Left Internal Jugular DIALYSIS CATHETER;  Surgeon: Angelia Mould, MD;  Location: [place];  Service: Vascular;   Laterality: N/A;  . LOOP RECORDER INSERTION N/A 10/01/2016   Procedure: LOOP RECORDER INSERTION;  Surgeon: Constance Haw, MD;  Location: [place] CV LAB;  Service: Cardiovascular;  Laterality: N/A;  . PATCH ANGIOPLASTY Right 02/17/2016   Procedure: PATCH ANGIOPLASTY;  Surgeon: Angelia Mould, MD;  Location: [place];  Service: Vascular;  Laterality: Right;  . PERIPHERAL VASCULAR CATHETERIZATION N/A 09/14/2014   Procedure: A/V Shuntogram/Fistulagram;  Surgeon: Katha Cabal, MD;  Location: [place] CV LAB;  Service: Cardiovascular;  Laterality: N/A;  . PERIPHERAL VASCULAR CATHETERIZATION N/A 09/14/2014   Procedure: A/V Shunt Intervention;  Surgeon: Katha Cabal, MD;  Location: [place] CV LAB;  Service: Cardiovascular;  Laterality: N/A;  . PERIPHERAL VASCULAR CATHETERIZATION Right 12/09/2014   Procedure: A/V Shuntogram/Fistulagram;  Surgeon: Algernon Huxley, MD;  Location: [place] CV LAB;  Service: Cardiovascular;  Laterality: Right;  . PERIPHERAL VASCULAR CATHETERIZATION N/A 12/09/2014   Procedure: A/V Shunt Intervention;  Surgeon: Algernon Huxley, MD;  Location: [place] [facility] LAB;  Service: Cardiovascular;  Laterality: N/A;  . PERIPHERAL VASCULAR CATHETERIZATION Right 05/24/2015   Procedure: A/V Shuntogram;  Surgeon: Serafina Mitchell, MD;  Location: [place] CV LAB;  Service: Cardiovascular;  Laterality: Right;  . PERIPHERAL VASCULAR CATHETERIZATION Right 05/24/2015   Procedure: Peripheral Vascular Balloon Angioplasty;  Surgeon: Serafina Mitchell, MD;  Location: [place] CV LAB;  Service: Cardiovascular;  Laterality: Right;  right arm shunt  . PERIPHERAL VASCULAR CATHETERIZATION N/A 06/13/2015   Procedure: A/V Shuntogram/Fistulagram;  Surgeon: Algernon Huxley, MD;  Location: [place] CV LAB;  Service: Cardiovascular;  Laterality: N/A;  . PERIPHERAL VASCULAR CATHETERIZATION N/A 06/13/2015   Procedure: A/V Shunt Intervention;  Surgeon: Algernon Huxley, MD;  Location: [place] CV LAB;  Service: Cardiovascular;  Laterality: N/A;  . TEE WITHOUT CARDIOVERSION N/A 02/22/2016   Procedure: TRANSESOPHAGEAL ECHOCARDIOGRAM (TEE);  Surgeon: Pixie Casino, MD;  Location: [facility] ENDOSCOPY;  Service: Cardiovascular;  Laterality: N/A;  . TEE WITHOUT CARDIOVERSION N/A 02/25/2016   Procedure: TRANSESOPHAGEAL ECHOCARDIOGRAM (TEE);  Surgeon: Melrose Nakayama, MD;  Location: [place];  Service: Open Heart Surgery;  Laterality: N/A;  . TEE WITHOUT CARDIOVERSION N/A 10/01/2016   Procedure: TRANSESOPHAGEAL ECHOCARDIOGRAM (TEE);  Surgeon: Pixie Casino, MD;  Location: [facility] ENDOSCOPY;  Service: Cardiovascular;  Laterality: N/A;  . TUBAL LIGATION  1983    Current Medications: Current Meds  Medication Sig  . cinacalcet (SENSIPAR) 60 MG tablet Take 120 mg by mouth every Monday, Wednesday, and Friday with hemodialysis.   [place] clopidogrel (PLAVIX) 75 MG tablet Take 1 tablet (75 mg total) by mouth daily.  . famotidine (PEPCID) 20 MG tablet Take 20 mg by mouth 2 (two) times daily. Lunch and dinner  . midodrine (PROAMATINE) 10 MG tablet Take 1 tablet (10 mg total) by mouth 2 (two) times daily as needed (30 min prior to HD).  . Multiple Vitamins-Minerals (PRORENAL QD PO) Take 1 tablet by mouth daily.   . polyethylene glycol (  MIRALAX / GLYCOLAX) packet Take 17 g by mouth daily as needed for mild constipation.  . sevelamer (RENVELA) 800 MG tablet Take 800 mg by mouth 3 (three) times daily with meals.      Allergies:   Contrast media [iodinated diagnostic agents]; Ancef [cefazolin]; and Naproxen sodium   Social History   Socioeconomic History  . Marital status: Divorced    Spouse name: Not on file  . Number of children: Not on file  . Years of education: Not on file  . Highest education level: Not on file  Occupational History  . Not on file  Social Needs  . Financial resource strain: Not on file  . Food insecurity:    Worry: Not on file    Inability: Not on file  . Transportation  needs:    Medical: Not on file    Non-medical: Not on file  Tobacco Use  . Smoking status: Never Smoker  . Smokeless tobacco: Never Used  Substance and Sexual Activity  . Alcohol use: No  . Drug use: No  . Sexual activity: Never    Birth control/protection: Post-menopausal    Comment: 1st intercourse 62 yo-Fewer than 5 partners  Lifestyle  . Physical activity:    Days per week: Not on file    Minutes per session: Not on file  . Stress: Not on file  Relationships  . Social connections:    Talks on phone: Not on file    Gets together: Not on file    Attends religious service: Not on file    Active member of club or organization: Not on file    Attends meetings of clubs or organizations: Not on file    Relationship status: Not on file  Other Topics Concern  . Not on file  Social History Narrative  . Not on file     Family History: The patient's family history includes Alcohol abuse in her father and mother; Breast cancer (age of onset: 4) in her sister; Cancer in her father; Diabetes in her mother; Heart disease in her mother; Hypertension in her mother, sister, and son; Kidney disease in her mother, sister, and son.  ROS:   Please see the history of present illness.    ROS  All other systems reviewed and negative.   EKGs/Labs/Other Studies Reviewed:    The following studies were reviewed today: none  EKG:  EKG is  ordered today.  The ekg ordered today demonstrates sinus rhythm at 63 bpm with ST-T wave abnormality in the inferior lateral leads which is unchanged from EKGs dating back to 2018.  Recent Labs: 02/01/2017: Magnesium 1.7 02/18/2017: ALT 17; BUN 18; Creatinine, Ser 5.59; Hemoglobin 10.5; Platelets 314; Potassium 3.3; Sodium 141   Recent Lipid Panel    Component Value Date/Time   CHOL 147 09/29/2016 0531   TRIG 83 09/29/2016 0531   HDL 66 09/29/2016 0531   CHOLHDL 2.2 09/29/2016 0531   VLDL 17 09/29/2016 0531   LDLCALC 64 09/29/2016 0531    Physical  Exam:    VS:  BP (!) 104/58   Pulse 63   Ht 5' (1.524 m)   Wt 164 lb 3.2 oz (74.5 kg)   BMI 32.07 kg/m     Wt Readings from Last 3 Encounters:  08/20/17 164 lb 3.2 oz (74.5 kg)  03/14/17 161 lb 6.4 oz (73.2 kg)  02/14/17 158 lb 9.6 oz (71.9 kg)     GEN:  Well nourished, well developed in no acute distress  HEENT: Normal NECK: No JVD; No carotid bruits LYMPHATICS: No lymphadenopathy CARDIAC: RRR, no rubs, gallops.  Over 6 systolic murmur the left lower sternal border RESPIRATORY:  Clear to auscultation without rales, wheezing or rhonchi  ABDOMEN: Soft, non-tender, non-distended MUSCULOSKELETAL:  No edema; No deformity  SKIN: Warm and dry NEUROLOGIC:  Alert and oriented x 3 PSYCHIATRIC:  Normal affect   ASSESSMENT:    1. H/O endocarditis   2. Pulmonary HTN ([place])   3. AVD (aortic valve disease)    PLAN:    In order of problems listed above:  1.  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  This was complicated by severe aortic insufficiency requiring bioprosthetic AVR.  2D echo 01/2017 showed stable aortic valve bioprosthesis.  2.  Pulmonary hypertension - this resolved on echo 01/2017 with PASP 33 mmHg  3.  Severe AR secondary to endocarditis - s/p bioprosthetic AVR -stable by echo 01/2017.  She is on Plavix instead of aspirin for TIA.   Medication Adjustments/Labs and Tests Ordered: Current medicines are reviewed at length with the patient today.  Concerns regarding medicines are outlined above.  Orders Placed This Encounter  Procedures  . EKG 12-Lead   No orders of the defined types were placed in this encounter.   Signed, Fransico Him, MD  08/20/2017 9:28 AM    [place]

## 2017-08-21 DIAGNOSIS — N2581 Secondary hyperparathyroidism of renal origin: Secondary | ICD-10-CM | POA: Diagnosis not present

## 2017-08-21 DIAGNOSIS — D631 Anemia in chronic kidney disease: Secondary | ICD-10-CM | POA: Diagnosis not present

## 2017-08-21 DIAGNOSIS — D509 Iron deficiency anemia, unspecified: Secondary | ICD-10-CM | POA: Diagnosis not present

## 2017-08-21 DIAGNOSIS — N186 End stage renal disease: Secondary | ICD-10-CM | POA: Diagnosis not present

## 2017-08-22 DIAGNOSIS — N186 End stage renal disease: Secondary | ICD-10-CM | POA: Diagnosis not present

## 2017-08-22 DIAGNOSIS — Z992 Dependence on renal dialysis: Secondary | ICD-10-CM | POA: Diagnosis not present

## 2017-08-22 DIAGNOSIS — I871 Compression of vein: Secondary | ICD-10-CM | POA: Diagnosis not present

## 2017-08-22 DIAGNOSIS — T82858A Stenosis of vascular prosthetic devices, implants and grafts, initial encounter: Secondary | ICD-10-CM | POA: Diagnosis not present

## 2017-08-23 DIAGNOSIS — D631 Anemia in chronic kidney disease: Secondary | ICD-10-CM | POA: Diagnosis not present

## 2017-08-23 DIAGNOSIS — D509 Iron deficiency anemia, unspecified: Secondary | ICD-10-CM | POA: Diagnosis not present

## 2017-08-23 DIAGNOSIS — N2581 Secondary hyperparathyroidism of renal origin: Secondary | ICD-10-CM | POA: Diagnosis not present

## 2017-08-23 DIAGNOSIS — N186 End stage renal disease: Secondary | ICD-10-CM | POA: Diagnosis not present

## 2017-08-26 DIAGNOSIS — N039 Chronic nephritic syndrome with unspecified morphologic changes: Secondary | ICD-10-CM | POA: Diagnosis not present

## 2017-08-26 DIAGNOSIS — N186 End stage renal disease: Secondary | ICD-10-CM | POA: Diagnosis not present

## 2017-08-26 DIAGNOSIS — D631 Anemia in chronic kidney disease: Secondary | ICD-10-CM | POA: Diagnosis not present

## 2017-08-26 DIAGNOSIS — Z992 Dependence on renal dialysis: Secondary | ICD-10-CM | POA: Diagnosis not present

## 2017-08-26 DIAGNOSIS — N2581 Secondary hyperparathyroidism of renal origin: Secondary | ICD-10-CM | POA: Diagnosis not present

## 2017-08-26 DIAGNOSIS — D509 Iron deficiency anemia, unspecified: Secondary | ICD-10-CM | POA: Diagnosis not present

## 2017-08-28 DIAGNOSIS — N186 End stage renal disease: Secondary | ICD-10-CM | POA: Diagnosis not present

## 2017-08-28 DIAGNOSIS — N2581 Secondary hyperparathyroidism of renal origin: Secondary | ICD-10-CM | POA: Diagnosis not present

## 2017-08-28 DIAGNOSIS — D509 Iron deficiency anemia, unspecified: Secondary | ICD-10-CM | POA: Diagnosis not present

## 2017-08-28 DIAGNOSIS — D631 Anemia in chronic kidney disease: Secondary | ICD-10-CM | POA: Diagnosis not present

## 2017-08-30 DIAGNOSIS — N2581 Secondary hyperparathyroidism of renal origin: Secondary | ICD-10-CM | POA: Diagnosis not present

## 2017-08-30 DIAGNOSIS — N186 End stage renal disease: Secondary | ICD-10-CM | POA: Diagnosis not present

## 2017-08-30 DIAGNOSIS — D509 Iron deficiency anemia, unspecified: Secondary | ICD-10-CM | POA: Diagnosis not present

## 2017-08-30 DIAGNOSIS — D631 Anemia in chronic kidney disease: Secondary | ICD-10-CM | POA: Diagnosis not present

## 2017-09-02 DIAGNOSIS — N2581 Secondary hyperparathyroidism of renal origin: Secondary | ICD-10-CM | POA: Diagnosis not present

## 2017-09-02 DIAGNOSIS — D631 Anemia in chronic kidney disease: Secondary | ICD-10-CM | POA: Diagnosis not present

## 2017-09-02 DIAGNOSIS — N186 End stage renal disease: Secondary | ICD-10-CM | POA: Diagnosis not present

## 2017-09-02 DIAGNOSIS — D509 Iron deficiency anemia, unspecified: Secondary | ICD-10-CM | POA: Diagnosis not present

## 2017-09-04 DIAGNOSIS — D631 Anemia in chronic kidney disease: Secondary | ICD-10-CM | POA: Diagnosis not present

## 2017-09-04 DIAGNOSIS — N2581 Secondary hyperparathyroidism of renal origin: Secondary | ICD-10-CM | POA: Diagnosis not present

## 2017-09-04 DIAGNOSIS — N186 End stage renal disease: Secondary | ICD-10-CM | POA: Diagnosis not present

## 2017-09-04 DIAGNOSIS — D509 Iron deficiency anemia, unspecified: Secondary | ICD-10-CM | POA: Diagnosis not present

## 2017-09-06 DIAGNOSIS — N186 End stage renal disease: Secondary | ICD-10-CM | POA: Diagnosis not present

## 2017-09-06 DIAGNOSIS — D509 Iron deficiency anemia, unspecified: Secondary | ICD-10-CM | POA: Diagnosis not present

## 2017-09-06 DIAGNOSIS — N2581 Secondary hyperparathyroidism of renal origin: Secondary | ICD-10-CM | POA: Diagnosis not present

## 2017-09-06 DIAGNOSIS — D631 Anemia in chronic kidney disease: Secondary | ICD-10-CM | POA: Diagnosis not present

## 2017-09-09 DIAGNOSIS — N2581 Secondary hyperparathyroidism of renal origin: Secondary | ICD-10-CM | POA: Diagnosis not present

## 2017-09-09 DIAGNOSIS — D631 Anemia in chronic kidney disease: Secondary | ICD-10-CM | POA: Diagnosis not present

## 2017-09-09 DIAGNOSIS — N186 End stage renal disease: Secondary | ICD-10-CM | POA: Diagnosis not present

## 2017-09-09 DIAGNOSIS — D509 Iron deficiency anemia, unspecified: Secondary | ICD-10-CM | POA: Diagnosis not present

## 2017-09-11 ENCOUNTER — Ambulatory Visit (INDEPENDENT_AMBULATORY_CARE_PROVIDER_SITE_OTHER): Payer: Medicare Other | Admitting: *Deleted

## 2017-09-11 DIAGNOSIS — N186 End stage renal disease: Secondary | ICD-10-CM | POA: Diagnosis not present

## 2017-09-11 DIAGNOSIS — D631 Anemia in chronic kidney disease: Secondary | ICD-10-CM | POA: Diagnosis not present

## 2017-09-11 DIAGNOSIS — N2581 Secondary hyperparathyroidism of renal origin: Secondary | ICD-10-CM | POA: Diagnosis not present

## 2017-09-11 DIAGNOSIS — I639 Cerebral infarction, unspecified: Secondary | ICD-10-CM | POA: Diagnosis not present

## 2017-09-11 DIAGNOSIS — D509 Iron deficiency anemia, unspecified: Secondary | ICD-10-CM | POA: Diagnosis not present

## 2017-09-12 NOTE — Progress Notes (Signed)
Carelink Summary Report / Loop Recorder 

## 2017-09-13 DIAGNOSIS — D631 Anemia in chronic kidney disease: Secondary | ICD-10-CM | POA: Diagnosis not present

## 2017-09-13 DIAGNOSIS — N2581 Secondary hyperparathyroidism of renal origin: Secondary | ICD-10-CM | POA: Diagnosis not present

## 2017-09-13 DIAGNOSIS — N186 End stage renal disease: Secondary | ICD-10-CM | POA: Diagnosis not present

## 2017-09-13 DIAGNOSIS — D509 Iron deficiency anemia, unspecified: Secondary | ICD-10-CM | POA: Diagnosis not present

## 2017-09-13 LAB — CUP PACEART REMOTE DEVICE CHECK
Implantable Pulse Generator Implant Date: 20180806
MDC IDC SESS DTM: 20190615000741

## 2017-09-16 DIAGNOSIS — D631 Anemia in chronic kidney disease: Secondary | ICD-10-CM | POA: Diagnosis not present

## 2017-09-16 DIAGNOSIS — N186 End stage renal disease: Secondary | ICD-10-CM | POA: Diagnosis not present

## 2017-09-16 DIAGNOSIS — D509 Iron deficiency anemia, unspecified: Secondary | ICD-10-CM | POA: Diagnosis not present

## 2017-09-16 DIAGNOSIS — N2581 Secondary hyperparathyroidism of renal origin: Secondary | ICD-10-CM | POA: Diagnosis not present

## 2017-09-18 DIAGNOSIS — N2581 Secondary hyperparathyroidism of renal origin: Secondary | ICD-10-CM | POA: Diagnosis not present

## 2017-09-18 DIAGNOSIS — N186 End stage renal disease: Secondary | ICD-10-CM | POA: Diagnosis not present

## 2017-09-18 DIAGNOSIS — D509 Iron deficiency anemia, unspecified: Secondary | ICD-10-CM | POA: Diagnosis not present

## 2017-09-18 DIAGNOSIS — D631 Anemia in chronic kidney disease: Secondary | ICD-10-CM | POA: Diagnosis not present

## 2017-09-20 DIAGNOSIS — D509 Iron deficiency anemia, unspecified: Secondary | ICD-10-CM | POA: Diagnosis not present

## 2017-09-20 DIAGNOSIS — N2581 Secondary hyperparathyroidism of renal origin: Secondary | ICD-10-CM | POA: Diagnosis not present

## 2017-09-20 DIAGNOSIS — N186 End stage renal disease: Secondary | ICD-10-CM | POA: Diagnosis not present

## 2017-09-20 DIAGNOSIS — D631 Anemia in chronic kidney disease: Secondary | ICD-10-CM | POA: Diagnosis not present

## 2017-09-23 DIAGNOSIS — D631 Anemia in chronic kidney disease: Secondary | ICD-10-CM | POA: Diagnosis not present

## 2017-09-23 DIAGNOSIS — N186 End stage renal disease: Secondary | ICD-10-CM | POA: Diagnosis not present

## 2017-09-23 DIAGNOSIS — D509 Iron deficiency anemia, unspecified: Secondary | ICD-10-CM | POA: Diagnosis not present

## 2017-09-23 DIAGNOSIS — N2581 Secondary hyperparathyroidism of renal origin: Secondary | ICD-10-CM | POA: Diagnosis not present

## 2017-09-24 ENCOUNTER — Ambulatory Visit (INDEPENDENT_AMBULATORY_CARE_PROVIDER_SITE_OTHER): Payer: Medicare Other | Admitting: Obstetrics & Gynecology

## 2017-09-24 ENCOUNTER — Encounter: Payer: Self-pay | Admitting: Obstetrics & Gynecology

## 2017-09-24 VITALS — BP 128/82 | Ht 60.0 in | Wt 164.8 lb

## 2017-09-24 DIAGNOSIS — Z780 Asymptomatic menopausal state: Secondary | ICD-10-CM | POA: Diagnosis not present

## 2017-09-24 DIAGNOSIS — Z01419 Encounter for gynecological examination (general) (routine) without abnormal findings: Secondary | ICD-10-CM | POA: Diagnosis not present

## 2017-09-24 DIAGNOSIS — R102 Pelvic and perineal pain: Secondary | ICD-10-CM | POA: Diagnosis not present

## 2017-09-24 DIAGNOSIS — B3731 Acute candidiasis of vulva and vagina: Secondary | ICD-10-CM

## 2017-09-24 DIAGNOSIS — B373 Candidiasis of vulva and vagina: Secondary | ICD-10-CM

## 2017-09-24 DIAGNOSIS — Z124 Encounter for screening for malignant neoplasm of cervix: Secondary | ICD-10-CM | POA: Diagnosis not present

## 2017-09-24 MED ORDER — FLUCONAZOLE 150 MG PO TABS: 150.0000 mg | ORAL_TABLET | Freq: Every day | ORAL | 2 refills | Status: AC

## 2017-09-24 NOTE — Progress Notes (Signed)
[name] Marks 1955-06-18 [id number]   History:    62 y.o. G2P2L2 widowed.  4 grand-children.  RP:  Established patient presenting for annual gyn exam   HPI:  Menopause, well on no HRT.  No PMB.  Right pelvic pain intermittently x >1 year.  Vaginal itching currently.  Abstinent. BMs wnl.  Breasts normal.  Kidney failure on Hemodialysis 3x per week.  Past medical history,surgical history, family history and social history were all reviewed and documented in the EPIC chart.  Gynecologic History No LMP recorded. Patient is postmenopausal. Contraception: abstinence and post menopausal status Last Pap: 08/2014. Results were: Negative/HPV HR neg Last mammogram: 07/2017. Results were: Negative Bone Density: 09/2016 Osteoporosis T-Score -2.6.  Decision not to start on treatment because of her Kidney Failure. Colonoscopy: 2016  Obstetric History OB History  Gravida Para Term Preterm AB Living  2 2       2   SAB TAB Ectopic Multiple Live Births               # Outcome Date GA Lbr Len/2nd Weight Sex Delivery Anes PTL Lv  2 Para           1 Para              ROS: A ROS was performed and pertinent positives and negatives are included in the history.  GENERAL: No fevers or chills. HEENT: No change in vision, no earache, sore throat or sinus congestion. NECK: No pain or stiffness. CARDIOVASCULAR: No chest pain or pressure. No palpitations. PULMONARY: No shortness of breath, cough or wheeze. GASTROINTESTINAL: No abdominal pain, nausea, vomiting or diarrhea, melena or bright red blood per rectum. GENITOURINARY: No urinary frequency, urgency, hesitancy or dysuria. MUSCULOSKELETAL: No joint or muscle pain, no back pain, no recent trauma. DERMATOLOGIC: No rash, no itching, no lesions. ENDOCRINE: No polyuria, polydipsia, no heat or cold intolerance. No recent change in weight. HEMATOLOGICAL: No anemia or easy bruising or bleeding. NEUROLOGIC: No headache, seizures, numbness, tingling or weakness.  PSYCHIATRIC: No depression, no loss of interest in normal activity or change in sleep pattern.     Exam:   BP 128/82   Ht 5' (1.524 m)   Wt 164 lb 12.8 oz (74.8 kg)   BMI 32.19 kg/m   Body mass index is 32.19 kg/m.  General appearance : Well developed well nourished female. No acute distress HEENT: Eyes: no retinal hemorrhage or exudates,  Neck supple, trachea midline, no carotid bruits, no thyroidmegaly Lungs: Clear to auscultation, no rhonchi or wheezes, or rib retractions  Heart: Regular rate and rhythm, no murmurs or gallops Breast:Examined in sitting and supine position were symmetrical in appearance, no palpable masses or tenderness,  no skin retraction, no nipple inversion, no nipple discharge, no skin discoloration, no axillary or supraclavicular lymphadenopathy Abdomen: no palpable masses or tenderness, no rebound or guarding Extremities: no edema or skin discoloration or tenderness  Pelvic: Vulva: Normal             Vagina: No gross lesions or discharge  Cervix: No gross lesions or discharge.  Pap reflex done.  Uterus  AV, normal size, shape and consistency, non-tender and mobile  Adnexa  Without masses or tenderness  Anus: Normal   Assessment/Plan:  62 y.o. female for annual exam   1. Encounter for routine gynecological examination with Papanicolaou smear of cervix Normal gynecologic exam.  Pap reflex done today.  Breast exam normal.  Last screening mammogram was negative in June 2019.  Last colonoscopy 2016.  Followed for kidney failure on hemodialysis 3 times a week.  2. Menopause present Well on no hormone replacement therapy.  No postmenopausal bleeding.  Recent bone density showed osteoporosis with a T score of -2.6.  Decision not to treat with medication because of patient's kidney failure.  Recommend vitamin D supplements, calcium rich nutrition and regular weightbearing physical activity.  3. Yeast vaginitis Vaginal itching.  Fluconazole 150 mg 1 tablet  daily for 3 days prescribed.  Usage reviewed.  4. Pelvic pain in female Right intermittent pelvic pain for more than a year.  Normal gynecologic exam today.  Patient will follow-up for a pelvic ultrasound for further investigation.  Rule out ovarian pathology. - US Transvaginal Non-OB; Future  Other orders - fluconazole (DIFLUCAN) 150 MG tablet; Take 1 tablet (150 mg total) by mouth daily for 3 days.  Counseling on above issues and coordination of care more than 50% for 15 minutes.  Princess Bruins MD, 2:13 PM 09/24/2017

## 2017-09-24 NOTE — Patient Instructions (Addendum)
1. Encounter for routine gynecological examination with Papanicolaou smear of cervix Normal gynecologic exam.  Pap reflex done today.  Breast exam normal.  Last screening mammogram was negative in June 2019.  Last colonoscopy 2016.  Followed for kidney failure on hemodialysis 3 times a week.  2. Menopause present Well on no hormone replacement therapy.  No postmenopausal bleeding.  Recent bone density showed osteoporosis with a T score of -2.6.  Decision not to treat with medication because of patient's kidney failure.  Recommend vitamin D supplements, calcium rich nutrition and regular weightbearing physical activity.  3. Yeast vaginitis Vaginal itching.  Fluconazole 150 mg 1 tablet daily for 3 days prescribed.  Usage reviewed.  4. Pelvic pain in female Right intermittent pelvic pain for more than a year.  Normal gynecologic exam today.  Patient will follow-up for a pelvic ultrasound for further investigation.  Rule out ovarian pathology. - US Transvaginal Non-OB; Future  Other orders - fluconazole (DIFLUCAN) 150 MG tablet; Take 1 tablet (150 mg total) by mouth daily for 3 days.  [name] Marks, it was a pleasure meeting you today!  I will inform you of your results as soon as they are available.

## 2017-09-25 DIAGNOSIS — N186 End stage renal disease: Secondary | ICD-10-CM | POA: Diagnosis not present

## 2017-09-25 DIAGNOSIS — D631 Anemia in chronic kidney disease: Secondary | ICD-10-CM | POA: Diagnosis not present

## 2017-09-25 DIAGNOSIS — D509 Iron deficiency anemia, unspecified: Secondary | ICD-10-CM | POA: Diagnosis not present

## 2017-09-25 DIAGNOSIS — N2581 Secondary hyperparathyroidism of renal origin: Secondary | ICD-10-CM | POA: Diagnosis not present

## 2017-09-25 LAB — PAP IG W/ RFLX HPV ASCU

## 2017-09-26 DIAGNOSIS — N186 End stage renal disease: Secondary | ICD-10-CM | POA: Diagnosis not present

## 2017-09-26 DIAGNOSIS — Z992 Dependence on renal dialysis: Secondary | ICD-10-CM | POA: Diagnosis not present

## 2017-09-26 DIAGNOSIS — N039 Chronic nephritic syndrome with unspecified morphologic changes: Secondary | ICD-10-CM | POA: Diagnosis not present

## 2017-09-27 DIAGNOSIS — D509 Iron deficiency anemia, unspecified: Secondary | ICD-10-CM | POA: Diagnosis not present

## 2017-09-27 DIAGNOSIS — N2581 Secondary hyperparathyroidism of renal origin: Secondary | ICD-10-CM | POA: Diagnosis not present

## 2017-09-27 DIAGNOSIS — D631 Anemia in chronic kidney disease: Secondary | ICD-10-CM | POA: Diagnosis not present

## 2017-09-27 DIAGNOSIS — N186 End stage renal disease: Secondary | ICD-10-CM | POA: Diagnosis not present

## 2017-09-30 DIAGNOSIS — D631 Anemia in chronic kidney disease: Secondary | ICD-10-CM | POA: Diagnosis not present

## 2017-09-30 DIAGNOSIS — N186 End stage renal disease: Secondary | ICD-10-CM | POA: Diagnosis not present

## 2017-09-30 DIAGNOSIS — D509 Iron deficiency anemia, unspecified: Secondary | ICD-10-CM | POA: Diagnosis not present

## 2017-09-30 DIAGNOSIS — N2581 Secondary hyperparathyroidism of renal origin: Secondary | ICD-10-CM | POA: Diagnosis not present

## 2017-10-02 DIAGNOSIS — D631 Anemia in chronic kidney disease: Secondary | ICD-10-CM | POA: Diagnosis not present

## 2017-10-02 DIAGNOSIS — D509 Iron deficiency anemia, unspecified: Secondary | ICD-10-CM | POA: Diagnosis not present

## 2017-10-02 DIAGNOSIS — N186 End stage renal disease: Secondary | ICD-10-CM | POA: Diagnosis not present

## 2017-10-02 DIAGNOSIS — N2581 Secondary hyperparathyroidism of renal origin: Secondary | ICD-10-CM | POA: Diagnosis not present

## 2017-10-04 DIAGNOSIS — D631 Anemia in chronic kidney disease: Secondary | ICD-10-CM | POA: Diagnosis not present

## 2017-10-04 DIAGNOSIS — N2581 Secondary hyperparathyroidism of renal origin: Secondary | ICD-10-CM | POA: Diagnosis not present

## 2017-10-04 DIAGNOSIS — D509 Iron deficiency anemia, unspecified: Secondary | ICD-10-CM | POA: Diagnosis not present

## 2017-10-04 DIAGNOSIS — N186 End stage renal disease: Secondary | ICD-10-CM | POA: Diagnosis not present

## 2017-10-07 DIAGNOSIS — D509 Iron deficiency anemia, unspecified: Secondary | ICD-10-CM | POA: Diagnosis not present

## 2017-10-07 DIAGNOSIS — N2581 Secondary hyperparathyroidism of renal origin: Secondary | ICD-10-CM | POA: Diagnosis not present

## 2017-10-07 DIAGNOSIS — N186 End stage renal disease: Secondary | ICD-10-CM | POA: Diagnosis not present

## 2017-10-07 DIAGNOSIS — D631 Anemia in chronic kidney disease: Secondary | ICD-10-CM | POA: Diagnosis not present

## 2017-10-08 DIAGNOSIS — G5603 Carpal tunnel syndrome, bilateral upper limbs: Secondary | ICD-10-CM | POA: Diagnosis not present

## 2017-10-09 DIAGNOSIS — N2581 Secondary hyperparathyroidism of renal origin: Secondary | ICD-10-CM | POA: Diagnosis not present

## 2017-10-09 DIAGNOSIS — D509 Iron deficiency anemia, unspecified: Secondary | ICD-10-CM | POA: Diagnosis not present

## 2017-10-09 DIAGNOSIS — N186 End stage renal disease: Secondary | ICD-10-CM | POA: Diagnosis not present

## 2017-10-09 DIAGNOSIS — D631 Anemia in chronic kidney disease: Secondary | ICD-10-CM | POA: Diagnosis not present

## 2017-10-11 DIAGNOSIS — N186 End stage renal disease: Secondary | ICD-10-CM | POA: Diagnosis not present

## 2017-10-11 DIAGNOSIS — N2581 Secondary hyperparathyroidism of renal origin: Secondary | ICD-10-CM | POA: Diagnosis not present

## 2017-10-11 DIAGNOSIS — D509 Iron deficiency anemia, unspecified: Secondary | ICD-10-CM | POA: Diagnosis not present

## 2017-10-11 DIAGNOSIS — D631 Anemia in chronic kidney disease: Secondary | ICD-10-CM | POA: Diagnosis not present

## 2017-10-14 ENCOUNTER — Telehealth: Payer: Self-pay

## 2017-10-14 ENCOUNTER — Ambulatory Visit (INDEPENDENT_AMBULATORY_CARE_PROVIDER_SITE_OTHER): Payer: Medicare Other | Admitting: *Deleted

## 2017-10-14 DIAGNOSIS — D631 Anemia in chronic kidney disease: Secondary | ICD-10-CM | POA: Diagnosis not present

## 2017-10-14 DIAGNOSIS — D509 Iron deficiency anemia, unspecified: Secondary | ICD-10-CM | POA: Diagnosis not present

## 2017-10-14 DIAGNOSIS — N2581 Secondary hyperparathyroidism of renal origin: Secondary | ICD-10-CM | POA: Diagnosis not present

## 2017-10-14 DIAGNOSIS — N186 End stage renal disease: Secondary | ICD-10-CM | POA: Diagnosis not present

## 2017-10-14 DIAGNOSIS — I639 Cerebral infarction, unspecified: Secondary | ICD-10-CM

## 2017-10-14 NOTE — Telephone Encounter (Signed)
   [facility] Pre-operative Risk Assessment    Request for surgical clearance:  1. What type of surgery is being performed?  Left Carpal Tunnel Release    2. When is this surgery scheduled? TBD   3. What type of clearance is required (medical clearance vs. Pharmacy clearance to hold med vs. Both)? Medical   4. Are there any medications that need to be held prior to surgery and how long? Undetermined    5. Practice name and name of physician performing surgery? [facility], Dr. Charlotte Crumb  6. What is your office phone number [phone]    7.   What is your office fax number [phone]  8.   Anesthesia type (None, local, MAC, general) ? Local   [name] Marks 10/14/2017, 12:36 PM  _________________________________________________________________   (provider comments below)  2

## 2017-10-15 NOTE — Progress Notes (Signed)
Carelink Summary Report / Loop Recorder 

## 2017-10-16 DIAGNOSIS — N2581 Secondary hyperparathyroidism of renal origin: Secondary | ICD-10-CM | POA: Diagnosis not present

## 2017-10-16 DIAGNOSIS — D509 Iron deficiency anemia, unspecified: Secondary | ICD-10-CM | POA: Diagnosis not present

## 2017-10-16 DIAGNOSIS — N186 End stage renal disease: Secondary | ICD-10-CM | POA: Diagnosis not present

## 2017-10-16 DIAGNOSIS — D631 Anemia in chronic kidney disease: Secondary | ICD-10-CM | POA: Diagnosis not present

## 2017-10-16 NOTE — Telephone Encounter (Signed)
   Primary Cardiologist: Dr. Radford Pax   Chart reviewed as part of pre-operative protocol coverage. Patient was contacted 10/16/2017 in reference to pre-operative risk assessment for pending surgery as outlined below.  [name] Marks was last seen on 08/20/17 by Dr. Radford Pax.  Since that day, [name] Marks has done well from a cardiac standpoint.  She denies anginal symptomatology.  She is able to ambulate a flight of stairs (4 metabolic equivalents) without exertional chest pain or dyspnea.  Therefore, based on ACC/AHA guidelines, the patient would be at acceptable risk for the planned procedure without further cardiovascular testing.  Contact PCP (Mikell, Jeani Sow, MD) at [facility] for clearance to hold Plavix.  Clearance regarding Plavix hold will need to come from her PCP who prescribes this.  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  I will route this recommendation to the requesting party via Epic fax function and remove from pre-op pool.  Please call with questions.  Lyda Jester, PA-C 10/16/2017, 4:32 PM

## 2017-10-18 DIAGNOSIS — N2581 Secondary hyperparathyroidism of renal origin: Secondary | ICD-10-CM | POA: Diagnosis not present

## 2017-10-18 DIAGNOSIS — D631 Anemia in chronic kidney disease: Secondary | ICD-10-CM | POA: Diagnosis not present

## 2017-10-18 DIAGNOSIS — D509 Iron deficiency anemia, unspecified: Secondary | ICD-10-CM | POA: Diagnosis not present

## 2017-10-18 DIAGNOSIS — N186 End stage renal disease: Secondary | ICD-10-CM | POA: Diagnosis not present

## 2017-10-21 DIAGNOSIS — D631 Anemia in chronic kidney disease: Secondary | ICD-10-CM | POA: Diagnosis not present

## 2017-10-21 DIAGNOSIS — N186 End stage renal disease: Secondary | ICD-10-CM | POA: Diagnosis not present

## 2017-10-21 DIAGNOSIS — D509 Iron deficiency anemia, unspecified: Secondary | ICD-10-CM | POA: Diagnosis not present

## 2017-10-21 DIAGNOSIS — N2581 Secondary hyperparathyroidism of renal origin: Secondary | ICD-10-CM | POA: Diagnosis not present

## 2017-10-23 DIAGNOSIS — N2581 Secondary hyperparathyroidism of renal origin: Secondary | ICD-10-CM | POA: Diagnosis not present

## 2017-10-23 DIAGNOSIS — D509 Iron deficiency anemia, unspecified: Secondary | ICD-10-CM | POA: Diagnosis not present

## 2017-10-23 DIAGNOSIS — D631 Anemia in chronic kidney disease: Secondary | ICD-10-CM | POA: Diagnosis not present

## 2017-10-23 DIAGNOSIS — N186 End stage renal disease: Secondary | ICD-10-CM | POA: Diagnosis not present

## 2017-10-24 ENCOUNTER — Other Ambulatory Visit: Payer: Self-pay | Admitting: Obstetrics & Gynecology

## 2017-10-24 ENCOUNTER — Ambulatory Visit (INDEPENDENT_AMBULATORY_CARE_PROVIDER_SITE_OTHER): Payer: Medicare Other

## 2017-10-24 ENCOUNTER — Ambulatory Visit (INDEPENDENT_AMBULATORY_CARE_PROVIDER_SITE_OTHER): Payer: Medicare Other | Admitting: Obstetrics & Gynecology

## 2017-10-24 DIAGNOSIS — R102 Pelvic and perineal pain: Secondary | ICD-10-CM

## 2017-10-24 DIAGNOSIS — R9389 Abnormal findings on diagnostic imaging of other specified body structures: Secondary | ICD-10-CM | POA: Diagnosis not present

## 2017-10-24 DIAGNOSIS — D251 Intramural leiomyoma of uterus: Secondary | ICD-10-CM | POA: Diagnosis not present

## 2017-10-24 NOTE — Progress Notes (Signed)
    [name] Marks [birth year] [id number]        62 y.o.  G2P2L2  RP: Rt Pelvic Pain for Pelvic US  HPI: Menopause well on no HRT.  No PMB.  Rt Pelvic pain intermittently.  Kidney failure on Hemodialysis 3 times a week.   OB History  Gravida Para Term Preterm AB Living  2 2       2   SAB TAB Ectopic Multiple Live Births               # Outcome Date GA Lbr Len/2nd Weight Sex Delivery Anes PTL Lv  2 Para           1 Para             Past medical history,surgical history, problem list, medications, allergies, family history and social history were all reviewed and documented in the EPIC chart.   Directed ROS with pertinent positives and negatives documented in the history of present illness/assessment and plan.  Exam:  There were no vitals filed for this visit. General appearance:  Normal  Pelvic US today: T/V images.  Anteverted uterus measuring 10.03 x 7.73 x 5.09 cm.  Subserosal fibroid measuring 2.9 x 2.8 cm with calcifications.  Intramural fibroids measuring 1.5 x 1.8 cm, 1.3 x 1.3 cm.  Endometrial lining is thickened at 8.2 mm.  Right and left ovaries normal.  No free fluid in the posterior cul-de-sac.   Assessment/Plan:  62 y.o. G2P2   1. Right intermittent pelvic pain in female Pelvic ultrasound findings reviewed with patient.  Both ovaries normal with no right or left adnexal cyst or mass and no free fluid in the posterior cul-de-sac.  Patient reassured that her right intermittent pelvic pains are probably not of gynecologic origin.  2. Thickened endometrium The endometrial lining is thickened for a menopausal woman on no hormone replacement therapy at 8.2 mm.  No postmenopausal bleeding.  Decision to pursue investigation with a sonohysterogram and possible endometrial biopsy. - [place] Sonohysterogram; Future  Counseling on above issues and coordination of care more than 50% for 15 minutes.  Princess Bruins MD, 4:01 PM 10/24/2017

## 2017-10-25 DIAGNOSIS — D631 Anemia in chronic kidney disease: Secondary | ICD-10-CM | POA: Diagnosis not present

## 2017-10-25 DIAGNOSIS — D509 Iron deficiency anemia, unspecified: Secondary | ICD-10-CM | POA: Diagnosis not present

## 2017-10-25 DIAGNOSIS — N186 End stage renal disease: Secondary | ICD-10-CM | POA: Diagnosis not present

## 2017-10-25 DIAGNOSIS — N2581 Secondary hyperparathyroidism of renal origin: Secondary | ICD-10-CM | POA: Diagnosis not present

## 2017-10-26 ENCOUNTER — Encounter: Payer: Self-pay | Admitting: Obstetrics & Gynecology

## 2017-10-26 NOTE — Patient Instructions (Signed)
1. Right intermittent pelvic pain in female Pelvic ultrasound findings reviewed with patient.  Both ovaries normal with no right or left adnexal cyst or mass and no free fluid in the posterior cul-de-sac.  Patient reassured that her right intermittent pelvic pains are probably not of gynecologic origin.  2. Thickened endometrium The endometrial lining is thickened for a menopausal woman on no hormone replacement therapy at 8.2 mm.  No postmenopausal bleeding.  Decision to pursue investigation with a sonohysterogram and possible endometrial biopsy. - [place] Sonohysterogram; Future  [name] Marks, it was a pleasure seeing you today!

## 2017-10-27 DIAGNOSIS — N186 End stage renal disease: Secondary | ICD-10-CM | POA: Diagnosis not present

## 2017-10-27 DIAGNOSIS — N039 Chronic nephritic syndrome with unspecified morphologic changes: Secondary | ICD-10-CM | POA: Diagnosis not present

## 2017-10-27 DIAGNOSIS — Z992 Dependence on renal dialysis: Secondary | ICD-10-CM | POA: Diagnosis not present

## 2017-10-28 DIAGNOSIS — N2581 Secondary hyperparathyroidism of renal origin: Secondary | ICD-10-CM | POA: Diagnosis not present

## 2017-10-28 DIAGNOSIS — D631 Anemia in chronic kidney disease: Secondary | ICD-10-CM | POA: Diagnosis not present

## 2017-10-28 DIAGNOSIS — N186 End stage renal disease: Secondary | ICD-10-CM | POA: Diagnosis not present

## 2017-10-28 DIAGNOSIS — D509 Iron deficiency anemia, unspecified: Secondary | ICD-10-CM | POA: Diagnosis not present

## 2017-10-29 LAB — CUP PACEART REMOTE DEVICE CHECK
Date Time Interrogation Session: 20190718003536
Implantable Pulse Generator Implant Date: 20180806

## 2017-10-30 DIAGNOSIS — D631 Anemia in chronic kidney disease: Secondary | ICD-10-CM | POA: Diagnosis not present

## 2017-10-30 DIAGNOSIS — D509 Iron deficiency anemia, unspecified: Secondary | ICD-10-CM | POA: Diagnosis not present

## 2017-10-30 DIAGNOSIS — N2581 Secondary hyperparathyroidism of renal origin: Secondary | ICD-10-CM | POA: Diagnosis not present

## 2017-10-30 DIAGNOSIS — N186 End stage renal disease: Secondary | ICD-10-CM | POA: Diagnosis not present

## 2017-11-01 DIAGNOSIS — D631 Anemia in chronic kidney disease: Secondary | ICD-10-CM | POA: Diagnosis not present

## 2017-11-01 DIAGNOSIS — N2581 Secondary hyperparathyroidism of renal origin: Secondary | ICD-10-CM | POA: Diagnosis not present

## 2017-11-01 DIAGNOSIS — N186 End stage renal disease: Secondary | ICD-10-CM | POA: Diagnosis not present

## 2017-11-01 DIAGNOSIS — D509 Iron deficiency anemia, unspecified: Secondary | ICD-10-CM | POA: Diagnosis not present

## 2017-11-04 DIAGNOSIS — N2581 Secondary hyperparathyroidism of renal origin: Secondary | ICD-10-CM | POA: Diagnosis not present

## 2017-11-04 DIAGNOSIS — D631 Anemia in chronic kidney disease: Secondary | ICD-10-CM | POA: Diagnosis not present

## 2017-11-04 DIAGNOSIS — N186 End stage renal disease: Secondary | ICD-10-CM | POA: Diagnosis not present

## 2017-11-04 DIAGNOSIS — D509 Iron deficiency anemia, unspecified: Secondary | ICD-10-CM | POA: Diagnosis not present

## 2017-11-06 DIAGNOSIS — N2581 Secondary hyperparathyroidism of renal origin: Secondary | ICD-10-CM | POA: Diagnosis not present

## 2017-11-06 DIAGNOSIS — D509 Iron deficiency anemia, unspecified: Secondary | ICD-10-CM | POA: Diagnosis not present

## 2017-11-06 DIAGNOSIS — N186 End stage renal disease: Secondary | ICD-10-CM | POA: Diagnosis not present

## 2017-11-06 DIAGNOSIS — D631 Anemia in chronic kidney disease: Secondary | ICD-10-CM | POA: Diagnosis not present

## 2017-11-08 DIAGNOSIS — N186 End stage renal disease: Secondary | ICD-10-CM | POA: Diagnosis not present

## 2017-11-08 DIAGNOSIS — D509 Iron deficiency anemia, unspecified: Secondary | ICD-10-CM | POA: Diagnosis not present

## 2017-11-08 DIAGNOSIS — N2581 Secondary hyperparathyroidism of renal origin: Secondary | ICD-10-CM | POA: Diagnosis not present

## 2017-11-08 DIAGNOSIS — D631 Anemia in chronic kidney disease: Secondary | ICD-10-CM | POA: Diagnosis not present

## 2017-11-11 DIAGNOSIS — N186 End stage renal disease: Secondary | ICD-10-CM | POA: Diagnosis not present

## 2017-11-11 DIAGNOSIS — N2581 Secondary hyperparathyroidism of renal origin: Secondary | ICD-10-CM | POA: Diagnosis not present

## 2017-11-11 DIAGNOSIS — D509 Iron deficiency anemia, unspecified: Secondary | ICD-10-CM | POA: Diagnosis not present

## 2017-11-11 DIAGNOSIS — D631 Anemia in chronic kidney disease: Secondary | ICD-10-CM | POA: Diagnosis not present

## 2017-11-13 DIAGNOSIS — N186 End stage renal disease: Secondary | ICD-10-CM | POA: Diagnosis not present

## 2017-11-13 DIAGNOSIS — D509 Iron deficiency anemia, unspecified: Secondary | ICD-10-CM | POA: Diagnosis not present

## 2017-11-13 DIAGNOSIS — N2581 Secondary hyperparathyroidism of renal origin: Secondary | ICD-10-CM | POA: Diagnosis not present

## 2017-11-13 DIAGNOSIS — D631 Anemia in chronic kidney disease: Secondary | ICD-10-CM | POA: Diagnosis not present

## 2017-11-14 DIAGNOSIS — G5602 Carpal tunnel syndrome, left upper limb: Secondary | ICD-10-CM | POA: Diagnosis not present

## 2017-11-15 DIAGNOSIS — D509 Iron deficiency anemia, unspecified: Secondary | ICD-10-CM | POA: Diagnosis not present

## 2017-11-15 DIAGNOSIS — N186 End stage renal disease: Secondary | ICD-10-CM | POA: Diagnosis not present

## 2017-11-15 DIAGNOSIS — D631 Anemia in chronic kidney disease: Secondary | ICD-10-CM | POA: Diagnosis not present

## 2017-11-15 DIAGNOSIS — N2581 Secondary hyperparathyroidism of renal origin: Secondary | ICD-10-CM | POA: Diagnosis not present

## 2017-11-18 ENCOUNTER — Ambulatory Visit (INDEPENDENT_AMBULATORY_CARE_PROVIDER_SITE_OTHER): Payer: Medicare Other | Admitting: *Deleted

## 2017-11-18 DIAGNOSIS — N2581 Secondary hyperparathyroidism of renal origin: Secondary | ICD-10-CM | POA: Diagnosis not present

## 2017-11-18 DIAGNOSIS — I639 Cerebral infarction, unspecified: Secondary | ICD-10-CM

## 2017-11-18 DIAGNOSIS — D631 Anemia in chronic kidney disease: Secondary | ICD-10-CM | POA: Diagnosis not present

## 2017-11-18 DIAGNOSIS — D509 Iron deficiency anemia, unspecified: Secondary | ICD-10-CM | POA: Diagnosis not present

## 2017-11-18 DIAGNOSIS — N186 End stage renal disease: Secondary | ICD-10-CM | POA: Diagnosis not present

## 2017-11-18 LAB — CUP PACEART REMOTE DEVICE CHECK
Date Time Interrogation Session: 20190820013529
MDC IDC PG IMPLANT DT: 20180806

## 2017-11-18 NOTE — Progress Notes (Signed)
Carelink Summary Report / Loop Recorder 

## 2017-11-20 DIAGNOSIS — D509 Iron deficiency anemia, unspecified: Secondary | ICD-10-CM | POA: Diagnosis not present

## 2017-11-20 DIAGNOSIS — N2581 Secondary hyperparathyroidism of renal origin: Secondary | ICD-10-CM | POA: Diagnosis not present

## 2017-11-20 DIAGNOSIS — N186 End stage renal disease: Secondary | ICD-10-CM | POA: Diagnosis not present

## 2017-11-20 DIAGNOSIS — D631 Anemia in chronic kidney disease: Secondary | ICD-10-CM | POA: Diagnosis not present

## 2017-11-22 DIAGNOSIS — D631 Anemia in chronic kidney disease: Secondary | ICD-10-CM | POA: Diagnosis not present

## 2017-11-22 DIAGNOSIS — N186 End stage renal disease: Secondary | ICD-10-CM | POA: Diagnosis not present

## 2017-11-22 DIAGNOSIS — D509 Iron deficiency anemia, unspecified: Secondary | ICD-10-CM | POA: Diagnosis not present

## 2017-11-22 DIAGNOSIS — N2581 Secondary hyperparathyroidism of renal origin: Secondary | ICD-10-CM | POA: Diagnosis not present

## 2017-11-25 ENCOUNTER — Other Ambulatory Visit: Payer: Self-pay | Admitting: Physician Assistant

## 2017-11-25 DIAGNOSIS — D509 Iron deficiency anemia, unspecified: Secondary | ICD-10-CM | POA: Diagnosis not present

## 2017-11-25 DIAGNOSIS — N186 End stage renal disease: Secondary | ICD-10-CM | POA: Diagnosis not present

## 2017-11-25 DIAGNOSIS — D631 Anemia in chronic kidney disease: Secondary | ICD-10-CM | POA: Diagnosis not present

## 2017-11-25 DIAGNOSIS — N2581 Secondary hyperparathyroidism of renal origin: Secondary | ICD-10-CM | POA: Diagnosis not present

## 2017-11-25 LAB — CUP PACEART REMOTE DEVICE CHECK
Date Time Interrogation Session: 20190922014134
Implantable Pulse Generator Implant Date: 20180806

## 2017-11-26 DIAGNOSIS — Z992 Dependence on renal dialysis: Secondary | ICD-10-CM | POA: Diagnosis not present

## 2017-11-26 DIAGNOSIS — N039 Chronic nephritic syndrome with unspecified morphologic changes: Secondary | ICD-10-CM | POA: Diagnosis not present

## 2017-11-26 DIAGNOSIS — N186 End stage renal disease: Secondary | ICD-10-CM | POA: Diagnosis not present

## 2017-11-27 DIAGNOSIS — Z23 Encounter for immunization: Secondary | ICD-10-CM | POA: Diagnosis not present

## 2017-11-27 DIAGNOSIS — N2581 Secondary hyperparathyroidism of renal origin: Secondary | ICD-10-CM | POA: Diagnosis not present

## 2017-11-27 DIAGNOSIS — N186 End stage renal disease: Secondary | ICD-10-CM | POA: Diagnosis not present

## 2017-11-27 DIAGNOSIS — D509 Iron deficiency anemia, unspecified: Secondary | ICD-10-CM | POA: Diagnosis not present

## 2017-11-27 DIAGNOSIS — D631 Anemia in chronic kidney disease: Secondary | ICD-10-CM | POA: Diagnosis not present

## 2017-11-28 ENCOUNTER — Ambulatory Visit (INDEPENDENT_AMBULATORY_CARE_PROVIDER_SITE_OTHER): Payer: Medicare Other

## 2017-11-28 ENCOUNTER — Ambulatory Visit (INDEPENDENT_AMBULATORY_CARE_PROVIDER_SITE_OTHER): Payer: Medicare Other | Admitting: Obstetrics & Gynecology

## 2017-11-28 DIAGNOSIS — R9389 Abnormal findings on diagnostic imaging of other specified body structures: Secondary | ICD-10-CM | POA: Diagnosis not present

## 2017-11-28 DIAGNOSIS — N8501 Benign endometrial hyperplasia: Secondary | ICD-10-CM | POA: Diagnosis not present

## 2017-11-28 NOTE — Progress Notes (Signed)
    [name] Marks Dec 29, 1955 [id number]        62 y.o.  G2P2   RP: Thickened endometrium for Sonohysterogram   HPI: Patient had a pelvic ultrasound for pelvic pain and incidental finding of a thickened endometrium at 8.2 mm.  Patient has uterine fibroids.  Menopause on no hormone replacement therapy and no postmenopausal bleeding.   OB History  Gravida Para Term Preterm AB Living  2 2       2   SAB TAB Ectopic Multiple Live Births               # Outcome Date GA Lbr Len/2nd Weight Sex Delivery Anes PTL Lv  2 Para           1 Para             Past medical history,surgical history, problem list, medications, allergies, family history and social history were all reviewed and documented in the EPIC chart.   Directed ROS with pertinent positives and negatives documented in the history of present illness/assessment and plan.  Exam:  There were no vitals filed for this visit. General appearance:  Normal                                                                    Sono Infusion Hysterogram ( procedure note)   The initial transvaginal ultrasound demonstrated the following: No change from the Pelvic US on 10/24/2017.  Endometrial line measured at 10.2 mm.  The speculum  was inserted and the cervix cleansed with Betadine solution after confirming that patient has no allergies.A small sonohysterography catheterwas utilized.  Insertion was facilitated with ring forceps, using a spear-like motion the catheter was inserted to the fundus of the uterus. The speculum is then removed carefully to avoid dislodging the catheter. The catheter was flushed with sterile saline delete prior to insertion to rid it of small amounts of air.the sterile saline solution was infused into the uterine cavity as a vaginal ultrasound probe was then placed in the vagina for full visualization of the uterine cavity from a transvaginal approach. The following was noted: Following injection of saline the  endometrium is filled with no defect seen.  The endometrial lining is measured at 6.3 mm excluding the fluid.  The catheter was then removed after retrieving some of the saline from the intrauterine cavity. An endometrial biopsy was done. Patient tolerated procedure well. She had received a tablet of Aleve for discomfort.    Assessment/Plan:  62 y.o. G2P2   1. Thickened endometrium No endometrial pathology on Sonohysto.  Endometrial line measured at 6.3 mm excluding the fluid.  Therefore an EBx was done.  Pending patho.  Management per pathology.  Princess Bruins MD, 2:22 PM 11/28/2017

## 2017-11-29 DIAGNOSIS — Z23 Encounter for immunization: Secondary | ICD-10-CM | POA: Diagnosis not present

## 2017-11-29 DIAGNOSIS — N186 End stage renal disease: Secondary | ICD-10-CM | POA: Diagnosis not present

## 2017-11-29 DIAGNOSIS — D509 Iron deficiency anemia, unspecified: Secondary | ICD-10-CM | POA: Diagnosis not present

## 2017-11-29 DIAGNOSIS — D631 Anemia in chronic kidney disease: Secondary | ICD-10-CM | POA: Diagnosis not present

## 2017-11-29 DIAGNOSIS — N2581 Secondary hyperparathyroidism of renal origin: Secondary | ICD-10-CM | POA: Diagnosis not present

## 2017-12-01 ENCOUNTER — Encounter: Payer: Self-pay | Admitting: Obstetrics & Gynecology

## 2017-12-01 NOTE — Patient Instructions (Signed)
1. Thickened endometrium No endometrial pathology on Sonohysto.  Endometrial line measured at 6.3 mm excluding the fluid.  Therefore an EBx was done.  Pending patho.  Management per pathology.  [name] Marks, it was a pleasure seeing you today!  I will inform you of your endometrial biopsy results as soon as they are available.

## 2017-12-02 DIAGNOSIS — D509 Iron deficiency anemia, unspecified: Secondary | ICD-10-CM | POA: Diagnosis not present

## 2017-12-02 DIAGNOSIS — Z23 Encounter for immunization: Secondary | ICD-10-CM | POA: Diagnosis not present

## 2017-12-02 DIAGNOSIS — N186 End stage renal disease: Secondary | ICD-10-CM | POA: Diagnosis not present

## 2017-12-02 DIAGNOSIS — N2581 Secondary hyperparathyroidism of renal origin: Secondary | ICD-10-CM | POA: Diagnosis not present

## 2017-12-02 DIAGNOSIS — D631 Anemia in chronic kidney disease: Secondary | ICD-10-CM | POA: Diagnosis not present

## 2017-12-03 ENCOUNTER — Other Ambulatory Visit: Payer: Self-pay | Admitting: Obstetrics & Gynecology

## 2017-12-03 MED ORDER — MEGESTROL ACETATE 40 MG PO TABS: ORAL_TABLET | ORAL | 3 refills | Status: DC

## 2017-12-04 DIAGNOSIS — N2581 Secondary hyperparathyroidism of renal origin: Secondary | ICD-10-CM | POA: Diagnosis not present

## 2017-12-04 DIAGNOSIS — D631 Anemia in chronic kidney disease: Secondary | ICD-10-CM | POA: Diagnosis not present

## 2017-12-04 DIAGNOSIS — D509 Iron deficiency anemia, unspecified: Secondary | ICD-10-CM | POA: Diagnosis not present

## 2017-12-04 DIAGNOSIS — Z23 Encounter for immunization: Secondary | ICD-10-CM | POA: Diagnosis not present

## 2017-12-04 DIAGNOSIS — N186 End stage renal disease: Secondary | ICD-10-CM | POA: Diagnosis not present

## 2017-12-06 DIAGNOSIS — Z23 Encounter for immunization: Secondary | ICD-10-CM | POA: Diagnosis not present

## 2017-12-06 DIAGNOSIS — N2581 Secondary hyperparathyroidism of renal origin: Secondary | ICD-10-CM | POA: Diagnosis not present

## 2017-12-06 DIAGNOSIS — N186 End stage renal disease: Secondary | ICD-10-CM | POA: Diagnosis not present

## 2017-12-06 DIAGNOSIS — D631 Anemia in chronic kidney disease: Secondary | ICD-10-CM | POA: Diagnosis not present

## 2017-12-06 DIAGNOSIS — D509 Iron deficiency anemia, unspecified: Secondary | ICD-10-CM | POA: Diagnosis not present

## 2017-12-09 DIAGNOSIS — N186 End stage renal disease: Secondary | ICD-10-CM | POA: Diagnosis not present

## 2017-12-09 DIAGNOSIS — D631 Anemia in chronic kidney disease: Secondary | ICD-10-CM | POA: Diagnosis not present

## 2017-12-09 DIAGNOSIS — D509 Iron deficiency anemia, unspecified: Secondary | ICD-10-CM | POA: Diagnosis not present

## 2017-12-09 DIAGNOSIS — Z23 Encounter for immunization: Secondary | ICD-10-CM | POA: Diagnosis not present

## 2017-12-09 DIAGNOSIS — N2581 Secondary hyperparathyroidism of renal origin: Secondary | ICD-10-CM | POA: Diagnosis not present

## 2017-12-11 DIAGNOSIS — Z23 Encounter for immunization: Secondary | ICD-10-CM | POA: Diagnosis not present

## 2017-12-11 DIAGNOSIS — D509 Iron deficiency anemia, unspecified: Secondary | ICD-10-CM | POA: Diagnosis not present

## 2017-12-11 DIAGNOSIS — D631 Anemia in chronic kidney disease: Secondary | ICD-10-CM | POA: Diagnosis not present

## 2017-12-11 DIAGNOSIS — N2581 Secondary hyperparathyroidism of renal origin: Secondary | ICD-10-CM | POA: Diagnosis not present

## 2017-12-11 DIAGNOSIS — N186 End stage renal disease: Secondary | ICD-10-CM | POA: Diagnosis not present

## 2017-12-13 DIAGNOSIS — D631 Anemia in chronic kidney disease: Secondary | ICD-10-CM | POA: Diagnosis not present

## 2017-12-13 DIAGNOSIS — N2581 Secondary hyperparathyroidism of renal origin: Secondary | ICD-10-CM | POA: Diagnosis not present

## 2017-12-13 DIAGNOSIS — D509 Iron deficiency anemia, unspecified: Secondary | ICD-10-CM | POA: Diagnosis not present

## 2017-12-13 DIAGNOSIS — N186 End stage renal disease: Secondary | ICD-10-CM | POA: Diagnosis not present

## 2017-12-13 DIAGNOSIS — Z23 Encounter for immunization: Secondary | ICD-10-CM | POA: Diagnosis not present

## 2017-12-16 DIAGNOSIS — D631 Anemia in chronic kidney disease: Secondary | ICD-10-CM | POA: Diagnosis not present

## 2017-12-16 DIAGNOSIS — N186 End stage renal disease: Secondary | ICD-10-CM | POA: Diagnosis not present

## 2017-12-16 DIAGNOSIS — N2581 Secondary hyperparathyroidism of renal origin: Secondary | ICD-10-CM | POA: Diagnosis not present

## 2017-12-16 DIAGNOSIS — Z23 Encounter for immunization: Secondary | ICD-10-CM | POA: Diagnosis not present

## 2017-12-16 DIAGNOSIS — D509 Iron deficiency anemia, unspecified: Secondary | ICD-10-CM | POA: Diagnosis not present

## 2017-12-18 DIAGNOSIS — D631 Anemia in chronic kidney disease: Secondary | ICD-10-CM | POA: Diagnosis not present

## 2017-12-18 DIAGNOSIS — Z23 Encounter for immunization: Secondary | ICD-10-CM | POA: Diagnosis not present

## 2017-12-18 DIAGNOSIS — N186 End stage renal disease: Secondary | ICD-10-CM | POA: Diagnosis not present

## 2017-12-18 DIAGNOSIS — D509 Iron deficiency anemia, unspecified: Secondary | ICD-10-CM | POA: Diagnosis not present

## 2017-12-18 DIAGNOSIS — N2581 Secondary hyperparathyroidism of renal origin: Secondary | ICD-10-CM | POA: Diagnosis not present

## 2017-12-19 ENCOUNTER — Ambulatory Visit (INDEPENDENT_AMBULATORY_CARE_PROVIDER_SITE_OTHER): Payer: Medicare Other | Admitting: *Deleted

## 2017-12-19 DIAGNOSIS — I639 Cerebral infarction, unspecified: Secondary | ICD-10-CM

## 2017-12-20 DIAGNOSIS — N186 End stage renal disease: Secondary | ICD-10-CM | POA: Diagnosis not present

## 2017-12-20 DIAGNOSIS — D509 Iron deficiency anemia, unspecified: Secondary | ICD-10-CM | POA: Diagnosis not present

## 2017-12-20 DIAGNOSIS — Z23 Encounter for immunization: Secondary | ICD-10-CM | POA: Diagnosis not present

## 2017-12-20 DIAGNOSIS — D631 Anemia in chronic kidney disease: Secondary | ICD-10-CM | POA: Diagnosis not present

## 2017-12-20 DIAGNOSIS — N2581 Secondary hyperparathyroidism of renal origin: Secondary | ICD-10-CM | POA: Diagnosis not present

## 2017-12-20 NOTE — Progress Notes (Signed)
Carelink Summary Report / Loop Recorder 

## 2017-12-23 DIAGNOSIS — N186 End stage renal disease: Secondary | ICD-10-CM | POA: Diagnosis not present

## 2017-12-23 DIAGNOSIS — D509 Iron deficiency anemia, unspecified: Secondary | ICD-10-CM | POA: Diagnosis not present

## 2017-12-23 DIAGNOSIS — Z23 Encounter for immunization: Secondary | ICD-10-CM | POA: Diagnosis not present

## 2017-12-23 DIAGNOSIS — N2581 Secondary hyperparathyroidism of renal origin: Secondary | ICD-10-CM | POA: Diagnosis not present

## 2017-12-23 DIAGNOSIS — D631 Anemia in chronic kidney disease: Secondary | ICD-10-CM | POA: Diagnosis not present

## 2017-12-25 DIAGNOSIS — Z23 Encounter for immunization: Secondary | ICD-10-CM | POA: Diagnosis not present

## 2017-12-25 DIAGNOSIS — D509 Iron deficiency anemia, unspecified: Secondary | ICD-10-CM | POA: Diagnosis not present

## 2017-12-25 DIAGNOSIS — N186 End stage renal disease: Secondary | ICD-10-CM | POA: Diagnosis not present

## 2017-12-25 DIAGNOSIS — D631 Anemia in chronic kidney disease: Secondary | ICD-10-CM | POA: Diagnosis not present

## 2017-12-25 DIAGNOSIS — N2581 Secondary hyperparathyroidism of renal origin: Secondary | ICD-10-CM | POA: Diagnosis not present

## 2017-12-26 DIAGNOSIS — N186 End stage renal disease: Secondary | ICD-10-CM | POA: Diagnosis not present

## 2017-12-26 DIAGNOSIS — T82858A Stenosis of vascular prosthetic devices, implants and grafts, initial encounter: Secondary | ICD-10-CM | POA: Diagnosis not present

## 2017-12-26 DIAGNOSIS — Z992 Dependence on renal dialysis: Secondary | ICD-10-CM | POA: Diagnosis not present

## 2017-12-26 DIAGNOSIS — I871 Compression of vein: Secondary | ICD-10-CM | POA: Diagnosis not present

## 2017-12-27 DIAGNOSIS — Z992 Dependence on renal dialysis: Secondary | ICD-10-CM | POA: Diagnosis not present

## 2017-12-27 DIAGNOSIS — D509 Iron deficiency anemia, unspecified: Secondary | ICD-10-CM | POA: Diagnosis not present

## 2017-12-27 DIAGNOSIS — N2581 Secondary hyperparathyroidism of renal origin: Secondary | ICD-10-CM | POA: Diagnosis not present

## 2017-12-27 DIAGNOSIS — D631 Anemia in chronic kidney disease: Secondary | ICD-10-CM | POA: Diagnosis not present

## 2017-12-27 DIAGNOSIS — N039 Chronic nephritic syndrome with unspecified morphologic changes: Secondary | ICD-10-CM | POA: Diagnosis not present

## 2017-12-27 DIAGNOSIS — N186 End stage renal disease: Secondary | ICD-10-CM | POA: Diagnosis not present

## 2017-12-30 DIAGNOSIS — N186 End stage renal disease: Secondary | ICD-10-CM | POA: Diagnosis not present

## 2017-12-30 DIAGNOSIS — D631 Anemia in chronic kidney disease: Secondary | ICD-10-CM | POA: Diagnosis not present

## 2017-12-30 DIAGNOSIS — N2581 Secondary hyperparathyroidism of renal origin: Secondary | ICD-10-CM | POA: Diagnosis not present

## 2017-12-30 DIAGNOSIS — D509 Iron deficiency anemia, unspecified: Secondary | ICD-10-CM | POA: Diagnosis not present

## 2018-01-01 DIAGNOSIS — N186 End stage renal disease: Secondary | ICD-10-CM | POA: Diagnosis not present

## 2018-01-01 DIAGNOSIS — D509 Iron deficiency anemia, unspecified: Secondary | ICD-10-CM | POA: Diagnosis not present

## 2018-01-01 DIAGNOSIS — N2581 Secondary hyperparathyroidism of renal origin: Secondary | ICD-10-CM | POA: Diagnosis not present

## 2018-01-01 DIAGNOSIS — D631 Anemia in chronic kidney disease: Secondary | ICD-10-CM | POA: Diagnosis not present

## 2018-01-03 DIAGNOSIS — D509 Iron deficiency anemia, unspecified: Secondary | ICD-10-CM | POA: Diagnosis not present

## 2018-01-03 DIAGNOSIS — N186 End stage renal disease: Secondary | ICD-10-CM | POA: Diagnosis not present

## 2018-01-03 DIAGNOSIS — D631 Anemia in chronic kidney disease: Secondary | ICD-10-CM | POA: Diagnosis not present

## 2018-01-03 DIAGNOSIS — N2581 Secondary hyperparathyroidism of renal origin: Secondary | ICD-10-CM | POA: Diagnosis not present

## 2018-01-03 LAB — CUP PACEART REMOTE DEVICE CHECK
Implantable Pulse Generator Implant Date: 20180806
MDC IDC SESS DTM: 20191025023852

## 2018-01-06 DIAGNOSIS — N2581 Secondary hyperparathyroidism of renal origin: Secondary | ICD-10-CM | POA: Diagnosis not present

## 2018-01-06 DIAGNOSIS — D509 Iron deficiency anemia, unspecified: Secondary | ICD-10-CM | POA: Diagnosis not present

## 2018-01-06 DIAGNOSIS — N186 End stage renal disease: Secondary | ICD-10-CM | POA: Diagnosis not present

## 2018-01-06 DIAGNOSIS — D631 Anemia in chronic kidney disease: Secondary | ICD-10-CM | POA: Diagnosis not present

## 2018-01-08 DIAGNOSIS — N2581 Secondary hyperparathyroidism of renal origin: Secondary | ICD-10-CM | POA: Diagnosis not present

## 2018-01-08 DIAGNOSIS — D509 Iron deficiency anemia, unspecified: Secondary | ICD-10-CM | POA: Diagnosis not present

## 2018-01-08 DIAGNOSIS — D631 Anemia in chronic kidney disease: Secondary | ICD-10-CM | POA: Diagnosis not present

## 2018-01-08 DIAGNOSIS — N186 End stage renal disease: Secondary | ICD-10-CM | POA: Diagnosis not present

## 2018-01-10 DIAGNOSIS — N186 End stage renal disease: Secondary | ICD-10-CM | POA: Diagnosis not present

## 2018-01-10 DIAGNOSIS — D509 Iron deficiency anemia, unspecified: Secondary | ICD-10-CM | POA: Diagnosis not present

## 2018-01-10 DIAGNOSIS — N2581 Secondary hyperparathyroidism of renal origin: Secondary | ICD-10-CM | POA: Diagnosis not present

## 2018-01-10 DIAGNOSIS — D631 Anemia in chronic kidney disease: Secondary | ICD-10-CM | POA: Diagnosis not present

## 2018-01-13 DIAGNOSIS — N2581 Secondary hyperparathyroidism of renal origin: Secondary | ICD-10-CM | POA: Diagnosis not present

## 2018-01-13 DIAGNOSIS — N186 End stage renal disease: Secondary | ICD-10-CM | POA: Diagnosis not present

## 2018-01-13 DIAGNOSIS — D631 Anemia in chronic kidney disease: Secondary | ICD-10-CM | POA: Diagnosis not present

## 2018-01-13 DIAGNOSIS — D509 Iron deficiency anemia, unspecified: Secondary | ICD-10-CM | POA: Diagnosis not present

## 2018-01-15 DIAGNOSIS — N186 End stage renal disease: Secondary | ICD-10-CM | POA: Diagnosis not present

## 2018-01-15 DIAGNOSIS — N2581 Secondary hyperparathyroidism of renal origin: Secondary | ICD-10-CM | POA: Diagnosis not present

## 2018-01-15 DIAGNOSIS — D631 Anemia in chronic kidney disease: Secondary | ICD-10-CM | POA: Diagnosis not present

## 2018-01-15 DIAGNOSIS — D509 Iron deficiency anemia, unspecified: Secondary | ICD-10-CM | POA: Diagnosis not present

## 2018-01-17 DIAGNOSIS — N2581 Secondary hyperparathyroidism of renal origin: Secondary | ICD-10-CM | POA: Diagnosis not present

## 2018-01-17 DIAGNOSIS — N186 End stage renal disease: Secondary | ICD-10-CM | POA: Diagnosis not present

## 2018-01-17 DIAGNOSIS — D631 Anemia in chronic kidney disease: Secondary | ICD-10-CM | POA: Diagnosis not present

## 2018-01-17 DIAGNOSIS — D509 Iron deficiency anemia, unspecified: Secondary | ICD-10-CM | POA: Diagnosis not present

## 2018-01-19 DIAGNOSIS — D631 Anemia in chronic kidney disease: Secondary | ICD-10-CM | POA: Diagnosis not present

## 2018-01-19 DIAGNOSIS — N186 End stage renal disease: Secondary | ICD-10-CM | POA: Diagnosis not present

## 2018-01-19 DIAGNOSIS — N2581 Secondary hyperparathyroidism of renal origin: Secondary | ICD-10-CM | POA: Diagnosis not present

## 2018-01-19 DIAGNOSIS — D509 Iron deficiency anemia, unspecified: Secondary | ICD-10-CM | POA: Diagnosis not present

## 2018-01-21 ENCOUNTER — Ambulatory Visit (INDEPENDENT_AMBULATORY_CARE_PROVIDER_SITE_OTHER): Payer: Medicare Other

## 2018-01-21 DIAGNOSIS — D631 Anemia in chronic kidney disease: Secondary | ICD-10-CM | POA: Diagnosis not present

## 2018-01-21 DIAGNOSIS — N2581 Secondary hyperparathyroidism of renal origin: Secondary | ICD-10-CM | POA: Diagnosis not present

## 2018-01-21 DIAGNOSIS — D509 Iron deficiency anemia, unspecified: Secondary | ICD-10-CM | POA: Diagnosis not present

## 2018-01-21 DIAGNOSIS — N186 End stage renal disease: Secondary | ICD-10-CM | POA: Diagnosis not present

## 2018-01-21 DIAGNOSIS — I639 Cerebral infarction, unspecified: Secondary | ICD-10-CM | POA: Diagnosis not present

## 2018-01-22 NOTE — Progress Notes (Signed)
Carelink Summary Report / Loop Recorder 

## 2018-01-24 DIAGNOSIS — D509 Iron deficiency anemia, unspecified: Secondary | ICD-10-CM | POA: Diagnosis not present

## 2018-01-24 DIAGNOSIS — N186 End stage renal disease: Secondary | ICD-10-CM | POA: Diagnosis not present

## 2018-01-24 DIAGNOSIS — D631 Anemia in chronic kidney disease: Secondary | ICD-10-CM | POA: Diagnosis not present

## 2018-01-24 DIAGNOSIS — N2581 Secondary hyperparathyroidism of renal origin: Secondary | ICD-10-CM | POA: Diagnosis not present

## 2018-01-25 DIAGNOSIS — D509 Iron deficiency anemia, unspecified: Secondary | ICD-10-CM | POA: Diagnosis not present

## 2018-01-25 DIAGNOSIS — N2581 Secondary hyperparathyroidism of renal origin: Secondary | ICD-10-CM | POA: Diagnosis not present

## 2018-01-25 DIAGNOSIS — D631 Anemia in chronic kidney disease: Secondary | ICD-10-CM | POA: Diagnosis not present

## 2018-01-25 DIAGNOSIS — N186 End stage renal disease: Secondary | ICD-10-CM | POA: Diagnosis not present

## 2018-01-26 DIAGNOSIS — N039 Chronic nephritic syndrome with unspecified morphologic changes: Secondary | ICD-10-CM | POA: Diagnosis not present

## 2018-01-26 DIAGNOSIS — N186 End stage renal disease: Secondary | ICD-10-CM | POA: Diagnosis not present

## 2018-01-26 DIAGNOSIS — Z992 Dependence on renal dialysis: Secondary | ICD-10-CM | POA: Diagnosis not present

## 2018-01-27 DIAGNOSIS — N2581 Secondary hyperparathyroidism of renal origin: Secondary | ICD-10-CM | POA: Diagnosis not present

## 2018-01-27 DIAGNOSIS — D631 Anemia in chronic kidney disease: Secondary | ICD-10-CM | POA: Diagnosis not present

## 2018-01-27 DIAGNOSIS — N186 End stage renal disease: Secondary | ICD-10-CM | POA: Diagnosis not present

## 2018-01-27 DIAGNOSIS — E8770 Fluid overload, unspecified: Secondary | ICD-10-CM | POA: Diagnosis not present

## 2018-01-27 DIAGNOSIS — D509 Iron deficiency anemia, unspecified: Secondary | ICD-10-CM | POA: Diagnosis not present

## 2018-01-29 DIAGNOSIS — D631 Anemia in chronic kidney disease: Secondary | ICD-10-CM | POA: Diagnosis not present

## 2018-01-29 DIAGNOSIS — D509 Iron deficiency anemia, unspecified: Secondary | ICD-10-CM | POA: Diagnosis not present

## 2018-01-29 DIAGNOSIS — E8770 Fluid overload, unspecified: Secondary | ICD-10-CM | POA: Diagnosis not present

## 2018-01-29 DIAGNOSIS — N2581 Secondary hyperparathyroidism of renal origin: Secondary | ICD-10-CM | POA: Diagnosis not present

## 2018-01-29 DIAGNOSIS — N186 End stage renal disease: Secondary | ICD-10-CM | POA: Diagnosis not present

## 2018-01-31 DIAGNOSIS — D509 Iron deficiency anemia, unspecified: Secondary | ICD-10-CM | POA: Diagnosis not present

## 2018-01-31 DIAGNOSIS — N2581 Secondary hyperparathyroidism of renal origin: Secondary | ICD-10-CM | POA: Diagnosis not present

## 2018-01-31 DIAGNOSIS — E8770 Fluid overload, unspecified: Secondary | ICD-10-CM | POA: Diagnosis not present

## 2018-01-31 DIAGNOSIS — N186 End stage renal disease: Secondary | ICD-10-CM | POA: Diagnosis not present

## 2018-01-31 DIAGNOSIS — D631 Anemia in chronic kidney disease: Secondary | ICD-10-CM | POA: Diagnosis not present

## 2018-02-03 DIAGNOSIS — N2581 Secondary hyperparathyroidism of renal origin: Secondary | ICD-10-CM | POA: Diagnosis not present

## 2018-02-03 DIAGNOSIS — D631 Anemia in chronic kidney disease: Secondary | ICD-10-CM | POA: Diagnosis not present

## 2018-02-03 DIAGNOSIS — D509 Iron deficiency anemia, unspecified: Secondary | ICD-10-CM | POA: Diagnosis not present

## 2018-02-03 DIAGNOSIS — N186 End stage renal disease: Secondary | ICD-10-CM | POA: Diagnosis not present

## 2018-02-03 DIAGNOSIS — E8770 Fluid overload, unspecified: Secondary | ICD-10-CM | POA: Diagnosis not present

## 2018-02-05 DIAGNOSIS — N2581 Secondary hyperparathyroidism of renal origin: Secondary | ICD-10-CM | POA: Diagnosis not present

## 2018-02-05 DIAGNOSIS — D509 Iron deficiency anemia, unspecified: Secondary | ICD-10-CM | POA: Diagnosis not present

## 2018-02-05 DIAGNOSIS — N186 End stage renal disease: Secondary | ICD-10-CM | POA: Diagnosis not present

## 2018-02-05 DIAGNOSIS — D631 Anemia in chronic kidney disease: Secondary | ICD-10-CM | POA: Diagnosis not present

## 2018-02-05 DIAGNOSIS — E8770 Fluid overload, unspecified: Secondary | ICD-10-CM | POA: Diagnosis not present

## 2018-02-07 DIAGNOSIS — D631 Anemia in chronic kidney disease: Secondary | ICD-10-CM | POA: Diagnosis not present

## 2018-02-07 DIAGNOSIS — D509 Iron deficiency anemia, unspecified: Secondary | ICD-10-CM | POA: Diagnosis not present

## 2018-02-07 DIAGNOSIS — N186 End stage renal disease: Secondary | ICD-10-CM | POA: Diagnosis not present

## 2018-02-07 DIAGNOSIS — N2581 Secondary hyperparathyroidism of renal origin: Secondary | ICD-10-CM | POA: Diagnosis not present

## 2018-02-07 DIAGNOSIS — E8770 Fluid overload, unspecified: Secondary | ICD-10-CM | POA: Diagnosis not present

## 2018-02-10 DIAGNOSIS — E8770 Fluid overload, unspecified: Secondary | ICD-10-CM | POA: Diagnosis not present

## 2018-02-10 DIAGNOSIS — D509 Iron deficiency anemia, unspecified: Secondary | ICD-10-CM | POA: Diagnosis not present

## 2018-02-10 DIAGNOSIS — N186 End stage renal disease: Secondary | ICD-10-CM | POA: Diagnosis not present

## 2018-02-10 DIAGNOSIS — N2581 Secondary hyperparathyroidism of renal origin: Secondary | ICD-10-CM | POA: Diagnosis not present

## 2018-02-10 DIAGNOSIS — D631 Anemia in chronic kidney disease: Secondary | ICD-10-CM | POA: Diagnosis not present

## 2018-02-12 DIAGNOSIS — N186 End stage renal disease: Secondary | ICD-10-CM | POA: Diagnosis not present

## 2018-02-12 DIAGNOSIS — D509 Iron deficiency anemia, unspecified: Secondary | ICD-10-CM | POA: Diagnosis not present

## 2018-02-12 DIAGNOSIS — D631 Anemia in chronic kidney disease: Secondary | ICD-10-CM | POA: Diagnosis not present

## 2018-02-12 DIAGNOSIS — E8770 Fluid overload, unspecified: Secondary | ICD-10-CM | POA: Diagnosis not present

## 2018-02-12 DIAGNOSIS — N2581 Secondary hyperparathyroidism of renal origin: Secondary | ICD-10-CM | POA: Diagnosis not present

## 2018-02-14 DIAGNOSIS — N2581 Secondary hyperparathyroidism of renal origin: Secondary | ICD-10-CM | POA: Diagnosis not present

## 2018-02-14 DIAGNOSIS — D631 Anemia in chronic kidney disease: Secondary | ICD-10-CM | POA: Diagnosis not present

## 2018-02-14 DIAGNOSIS — N186 End stage renal disease: Secondary | ICD-10-CM | POA: Diagnosis not present

## 2018-02-14 DIAGNOSIS — D509 Iron deficiency anemia, unspecified: Secondary | ICD-10-CM | POA: Diagnosis not present

## 2018-02-14 DIAGNOSIS — E8770 Fluid overload, unspecified: Secondary | ICD-10-CM | POA: Diagnosis not present

## 2018-02-16 DIAGNOSIS — E8770 Fluid overload, unspecified: Secondary | ICD-10-CM | POA: Diagnosis not present

## 2018-02-16 DIAGNOSIS — N2581 Secondary hyperparathyroidism of renal origin: Secondary | ICD-10-CM | POA: Diagnosis not present

## 2018-02-16 DIAGNOSIS — N186 End stage renal disease: Secondary | ICD-10-CM | POA: Diagnosis not present

## 2018-02-16 DIAGNOSIS — D631 Anemia in chronic kidney disease: Secondary | ICD-10-CM | POA: Diagnosis not present

## 2018-02-16 DIAGNOSIS — D509 Iron deficiency anemia, unspecified: Secondary | ICD-10-CM | POA: Diagnosis not present

## 2018-02-18 DIAGNOSIS — E8770 Fluid overload, unspecified: Secondary | ICD-10-CM | POA: Diagnosis not present

## 2018-02-18 DIAGNOSIS — N186 End stage renal disease: Secondary | ICD-10-CM | POA: Diagnosis not present

## 2018-02-18 DIAGNOSIS — D509 Iron deficiency anemia, unspecified: Secondary | ICD-10-CM | POA: Diagnosis not present

## 2018-02-18 DIAGNOSIS — D631 Anemia in chronic kidney disease: Secondary | ICD-10-CM | POA: Diagnosis not present

## 2018-02-18 DIAGNOSIS — N2581 Secondary hyperparathyroidism of renal origin: Secondary | ICD-10-CM | POA: Diagnosis not present

## 2018-02-21 ENCOUNTER — Encounter (HOSPITAL_COMMUNITY): Payer: Self-pay | Admitting: Emergency Medicine

## 2018-02-21 ENCOUNTER — Emergency Department (HOSPITAL_COMMUNITY)
Admission: EM | Admit: 2018-02-21 | Discharge: 2018-02-21 | Disposition: A | Discharge status: Home / Self Care | Payer: Medicare Other | Attending: Emergency Medicine | Admitting: Emergency Medicine

## 2018-02-21 ENCOUNTER — Inpatient Hospital Stay (HOSPITAL_BASED_OUTPATIENT_CLINIC_OR_DEPARTMENT_OTHER): Payer: Medicare Other

## 2018-02-21 DIAGNOSIS — S8012XA Contusion of left lower leg, initial encounter: Secondary | ICD-10-CM | POA: Diagnosis not present

## 2018-02-21 DIAGNOSIS — Z7901 Long term (current) use of anticoagulants: Secondary | ICD-10-CM | POA: Diagnosis not present

## 2018-02-21 DIAGNOSIS — X58XXXA Exposure to other specified factors, initial encounter: Secondary | ICD-10-CM | POA: Insufficient documentation

## 2018-02-21 DIAGNOSIS — I120 Hypertensive chronic kidney disease with stage 5 chronic kidney disease or end stage renal disease: Secondary | ICD-10-CM | POA: Diagnosis not present

## 2018-02-21 DIAGNOSIS — Y999 Unspecified external cause status: Secondary | ICD-10-CM | POA: Insufficient documentation

## 2018-02-21 DIAGNOSIS — R2242 Localized swelling, mass and lump, left lower limb: Secondary | ICD-10-CM | POA: Diagnosis present

## 2018-02-21 DIAGNOSIS — N2581 Secondary hyperparathyroidism of renal origin: Secondary | ICD-10-CM | POA: Diagnosis not present

## 2018-02-21 DIAGNOSIS — Z8673 Personal history of transient ischemic attack (TIA), and cerebral infarction without residual deficits: Secondary | ICD-10-CM | POA: Diagnosis not present

## 2018-02-21 DIAGNOSIS — Y939 Activity, unspecified: Secondary | ICD-10-CM | POA: Insufficient documentation

## 2018-02-21 DIAGNOSIS — T8249XA Other complication of vascular dialysis catheter, initial encounter: Secondary | ICD-10-CM | POA: Diagnosis not present

## 2018-02-21 DIAGNOSIS — N186 End stage renal disease: Secondary | ICD-10-CM | POA: Insufficient documentation

## 2018-02-21 DIAGNOSIS — E8770 Fluid overload, unspecified: Secondary | ICD-10-CM | POA: Diagnosis not present

## 2018-02-21 DIAGNOSIS — M7989 Other specified soft tissue disorders: Secondary | ICD-10-CM | POA: Diagnosis not present

## 2018-02-21 DIAGNOSIS — R52 Pain, unspecified: Secondary | ICD-10-CM | POA: Diagnosis not present

## 2018-02-21 DIAGNOSIS — D631 Anemia in chronic kidney disease: Secondary | ICD-10-CM | POA: Diagnosis not present

## 2018-02-21 DIAGNOSIS — Y929 Unspecified place or not applicable: Secondary | ICD-10-CM | POA: Insufficient documentation

## 2018-02-21 DIAGNOSIS — Y69 Unspecified misadventure during surgical and medical care: Secondary | ICD-10-CM | POA: Diagnosis not present

## 2018-02-21 DIAGNOSIS — D509 Iron deficiency anemia, unspecified: Secondary | ICD-10-CM | POA: Diagnosis not present

## 2018-02-21 DIAGNOSIS — Z79899 Other long term (current) drug therapy: Secondary | ICD-10-CM | POA: Diagnosis not present

## 2018-02-21 LAB — BASIC METABOLIC PANEL
Anion gap: 19 — ABNORMAL HIGH (ref 5–15)
BUN: 66 mg/dL — ABNORMAL HIGH (ref 8–23)
CHLORIDE: 95 mmol/L — AB (ref 98–111)
CO2: 27 mmol/L (ref 22–32)
Calcium: 9.1 mg/dL (ref 8.9–10.3)
Creatinine, Ser: 11.36 mg/dL — ABNORMAL HIGH (ref 0.44–1.00)
GFR calc Af Amer: 4 mL/min — ABNORMAL LOW (ref >60)
GFR calc non Af Amer: 3 mL/min — ABNORMAL LOW (ref >60)
Glucose, Bld: 124 mg/dL — ABNORMAL HIGH (ref 70–99)
Potassium: 4.1 mmol/L (ref 3.5–5.1)
SODIUM: 141 mmol/L (ref 135–145)

## 2018-02-21 LAB — CBC WITH DIFFERENTIAL/PLATELET
Abs Immature Granulocytes: 0.03 10*3/uL (ref 0.00–0.07)
Basophils Absolute: 0 10*3/uL (ref 0.0–0.1)
Basophils Relative: 0 %
Eosinophils Absolute: 0.3 10*3/uL (ref 0.0–0.5)
Eosinophils Relative: 3 %
HCT: 27.9 % — ABNORMAL LOW (ref 36.0–46.0)
HEMOGLOBIN: 9.2 g/dL — AB (ref 12.0–15.0)
Immature Granulocytes: 0 %
Lymphocytes Relative: 18 %
Lymphs Abs: 1.8 10*3/uL (ref 0.7–4.0)
MCH: 32.2 pg (ref 26.0–34.0)
MCHC: 33 g/dL (ref 30.0–36.0)
MCV: 97.6 fL (ref 80.0–100.0)
Monocytes Absolute: 0.6 10*3/uL (ref 0.1–1.0)
Monocytes Relative: 6 %
Neutro Abs: 7.3 10*3/uL (ref 1.7–7.7)
Neutrophils Relative %: 73 %
Platelets: 231 10*3/uL (ref 150–400)
RBC: 2.86 MIL/uL — ABNORMAL LOW (ref 3.87–5.11)
RDW: 16.7 % — ABNORMAL HIGH (ref 11.5–15.5)
WBC: 10.1 10*3/uL (ref 4.0–10.5)
nRBC: 0 % (ref 0.0–0.2)

## 2018-02-21 MED ORDER — FENTANYL CITRATE (PF) 100 MCG/2ML IJ SOLN
50.0000 ug | Freq: Once | INTRAMUSCULAR | Status: AC
  Administered 2018-02-21: 50 ug via INTRAVENOUS
  Filled 2018-02-21: qty 2

## 2018-02-21 NOTE — ED Triage Notes (Signed)
Pt reports going to HD today and being stuck in leg and now has swelling to left upper thigh. Reports had last full HD treatment Tuesday.

## 2018-02-21 NOTE — ED Provider Notes (Signed)
Medical screening examination/treatment/procedure(s) were conducted as a shared visit with non-physician practitioner(s) and myself.  I personally evaluated the patient during the encounter.  62 yo F w/ ho dialysis and AV graft in left leg. Started dialysis today and had instant swelling to left medial thigh with pain. Stopped dialysis. Hasn't worsened since then.  On eval has large softball sized area of swelling and ttp to left medial thigh. No active bleeding. No erythema. No skin breakdown currently.  Plan for [place] to eval for graft function. If normal and no obvious pseudoaneurysm, would recommend compresses, pain meds and normal hematoma care. If abnormal, will consult vascular.    Merrily Pew, MD 02/21/18 [phone]

## 2018-02-21 NOTE — Progress Notes (Signed)
Vascular Ultrasound Duplex Dialysis Access (AVF, AGV) has been completed.   Refer to "CV Proc" under chart review to view preliminary results.  02/21/2018 2:20 PM Maudry Mayhew, MHA, RVT, RDCS, RDMS

## 2018-02-21 NOTE — ED Provider Notes (Signed)
[facility] EMERGENCY DEPARTMENT Provider Note   CSN: [id number] Arrival date & time: 02/21/18  6387     History   Chief Complaint Chief Complaint  Patient presents with  . Vascular Access Problem  . Leg Swelling    HPI [name] Marks is a 62 y.o. female.  62 y.o female with a PMH of CVA, AVD,GERD presents to the ED with a chief complaint of left leg swelling x today.  Reports going to dialysis this morning and having someone attempt to access her port and it causing pain, she then reports a second person attempting to access her port and this is causing more swelling on her left leg.  Patient did not receive dialysis treatment today.  She was advised to place ice however patient reports the left leg is swollen is worsening.  Has been applying ice without relief.  She was ambulatory on arrival and denies any leg weakness, shortness of breath, chest pain.     Past Medical History:  Diagnosis Date  . Anemia   . Aortic valve prosthesis present 02/25/2016  . Arthritis    "knees" (01/03/2017)  . AVD (aortic valve disease) 07/12/2016  . Backache 12/06/2008  . Bacteremia due to coagulase-negative Staphylococcus   . Carpal tunnel syndrome   . Cerebral embolism with transient ischemic attack (TIA)   . Cholelithiases 01/28/2017  . Chronic female pelvic pain 08/08/2012  . Complication of anesthesia    01/01/17- '"a long time ago, difficulty breathimg, not sure if it was due to anesthesia or not."  . CVA (cerebral vascular accident) ([place]) 05/16/2016  . End stage renal disease on dialysis ([place])    "MWF; [place]." (01/03/2017)  . Endocarditis   . Esophageal reflux 12/06/2008  . GERD (gastroesophageal reflux disease)   . Gout   . History of blood transfusion 2017   "related to blood poison" (01/03/2017)  . Increased endometrial stripe thickness 11/03/2015  . Neck pain   . Osteoporosis 09/2016   T score -2.6  . Pain and swelling of right upper extremity 12/20/2015   . Pulmonary HTN ([place]) 07/12/2016  . Renal dialysis device, implant, or graft complication 56/43/3295  . Renal insufficiency   . S/P cholecystectomy 02/14/2017  . Septic shock ([place])   . Staphylococcus aureus bacteremia   . TIA (transient ischemic attack)    "several" (01/03/2017)    Patient Active Problem List   Diagnosis Date Noted  . S/P cholecystectomy 02/14/2017  . Cholelithiases 01/28/2017  . ESRD (end stage renal disease) ([place]) 01/03/2017  . Osteoporosis 12/11/2016  . Cholelithiasis 11/07/2016  . Cerebral embolism with transient ischemic attack (TIA)   . TIA (transient ischemic attack)   . AVD (aortic valve disease) 07/12/2016  . Pulmonary HTN ([place]) 07/12/2016  . Carpal tunnel syndrome 07/05/2016  . 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 CVA (cerebral vascular accident) ([place]) 05/16/2016  . History of aortic valve replacement with bioprosthetic valve 05/08/2016  . Aortic valve prosthesis present 02/25/2016  . Bacteremia due to coagulase-negative Staphylococcus   . S/P dialysis catheter insertion ([place])   . Neck pain   . History of endocarditis   . Anemia   . Renal dialysis device, implant, or graft complication 18/84/1660  . Pain and swelling of right upper extremity 12/20/2015  . Complex endometrial hyperplasia with atypia 11/08/2015  . Increased endometrial stripe thickness 11/03/2015  . Obesity 10/28/2014  . Health care maintenance 10/28/2014  . Right carotid bruit 01/08/2013  . Generalized headaches 01/08/2013  .  Chronic female pelvic pain 08/08/2012  . Gout, unspecified 12/06/2008  . Esophageal reflux 12/06/2008  . Backache 12/06/2008  . FIBROIDS, UTERUS 11/24/2008  . ESRD (end stage renal disease) on dialysis ([place]) 07/01/2006    Past Surgical History:  Procedure Laterality Date  . AORTIC VALVE REPLACEMENT N/A 02/25/2016   Procedure: AORTIC VALVE REPLACEMENT (AVR) implanted with Magna Ease Aortic valve size 19mm;  Surgeon: Melrose Nakayama, MD;   Location: [facility];  Service: Open Heart Surgery;  Laterality: N/A;  . AV FISTULA PLACEMENT Left 01/03/2017   Procedure: INSERTION OF ARTERIOVENOUS (AV) GORE-TEX GRAFT LEFT THIGH;  Surgeon: Serafina Mitchell, MD;  Location: [facility];  Service: Vascular;  Laterality: Left;  . [place] REMOVAL Right 02/17/2016   Procedure: REMOVAL OF TWO ARTERIOVENOUS GORETEX GRAFTS ([place]);  Surgeon: Angelia Mould, MD;  Location: [facility];  Service: Vascular;  Laterality: Right;  . BREAST BIOPSY Left 2013   stereo   . BREAST BIOPSY Right 2011   stereo   . CARDIAC VALVE REPLACEMENT    . CHOLECYSTECTOMY N/A 01/30/2017   Procedure: LAPAROSCOPIC CHOLECYSTECTOMY;  Surgeon: Erroll Luna, MD;  Location: [facility];  Service: General;  Laterality: N/A;  . COLONOSCOPY W/ POLYPECTOMY    . DG AV DIALYSIS GRAFT DECLOT OR    . DILATATION & CURETTAGE/HYSTEROSCOPY WITH TRUECLEAR N/A 11/06/2012   Procedure: DILATATION & CURETTAGE/HYSTEROSCOPY WITH TRUECLEAR;  Surgeon: Terrance Mass, MD;  Location: [facility];  Service: Gynecology;  Laterality: N/A;  Truclear Resectoscopic Polypectomy   . INSERTION OF DIALYSIS CATHETER N/A 02/19/2016   Procedure: INSERTION OF Left Internal Jugular DIALYSIS CATHETER;  Surgeon: Angelia Mould, MD;  Location: [facility];  Service: Vascular;  Laterality: N/A;  . LOOP RECORDER INSERTION N/A 10/01/2016   Procedure: LOOP RECORDER INSERTION;  Surgeon: Constance Haw, MD;  Location: [facility];  Service: Cardiovascular;  Laterality: N/A;  . PATCH ANGIOPLASTY Right 02/17/2016   Procedure: PATCH ANGIOPLASTY;  Surgeon: Angelia Mould, MD;  Location: [facility];  Service: Vascular;  Laterality: Right;  . PERIPHERAL VASCULAR CATHETERIZATION N/A 09/14/2014   Procedure: A/V Shuntogram/Fistulagram;  Surgeon: Katha Cabal, MD;  Location: [facility];  Service: Cardiovascular;  Laterality: N/A;  . PERIPHERAL VASCULAR CATHETERIZATION N/A 09/14/2014   Procedure: A/V Shunt Intervention;  Surgeon:  Katha Cabal, MD;  Location: [facility];  Service: Cardiovascular;  Laterality: N/A;  . PERIPHERAL VASCULAR CATHETERIZATION Right 12/09/2014   Procedure: A/V Shuntogram/Fistulagram;  Surgeon: Algernon Huxley, MD;  Location: [facility];  Service: Cardiovascular;  Laterality: Right;  . PERIPHERAL VASCULAR CATHETERIZATION N/A 12/09/2014   Procedure: A/V Shunt Intervention;  Surgeon: Algernon Huxley, MD;  Location: [facility];  Service: Cardiovascular;  Laterality: N/A;  . PERIPHERAL VASCULAR CATHETERIZATION Right 05/24/2015   Procedure: A/V Shuntogram;  Surgeon: Serafina Mitchell, MD;  Location: [facility];  Service: Cardiovascular;  Laterality: Right;  . PERIPHERAL VASCULAR CATHETERIZATION Right 05/24/2015   Procedure: Peripheral Vascular Balloon Angioplasty;  Surgeon: Serafina Mitchell, MD;  Location: [facility];  Service: Cardiovascular;  Laterality: Right;  right arm shunt  . PERIPHERAL VASCULAR CATHETERIZATION N/A 06/13/2015   Procedure: A/V Shuntogram/Fistulagram;  Surgeon: Algernon Huxley, MD;  Location: [facility];  Service: Cardiovascular;  Laterality: N/A;  . PERIPHERAL VASCULAR CATHETERIZATION N/A 06/13/2015   Procedure: A/V Shunt Intervention;  Surgeon: Algernon Huxley, MD;  Location: [facility];  Service: Cardiovascular;  Laterality:  N/A;  . TEE WITHOUT CARDIOVERSION N/A 02/22/2016   Procedure: TRANSESOPHAGEAL ECHOCARDIOGRAM (TEE);  Surgeon: Pixie Casino, MD;  Location: [facility];  Service: Cardiovascular;  Laterality: N/A;  . TEE WITHOUT CARDIOVERSION N/A 02/25/2016   Procedure: TRANSESOPHAGEAL ECHOCARDIOGRAM (TEE);  Surgeon: Melrose Nakayama, MD;  Location: [facility];  Service: Open Heart Surgery;  Laterality: N/A;  . TEE WITHOUT CARDIOVERSION N/A 10/01/2016   Procedure: TRANSESOPHAGEAL ECHOCARDIOGRAM (TEE);  Surgeon: Pixie Casino, MD;  Location: [facility] END[facility]COPY;  Service: Cardiovascular;  Laterality: N/A;  . TUBAL LIGATION  1983      OB History    Gravida  2   Para  2   Term      Preterm      AB      Living  2     SAB      TAB      Ectopic      Multiple      Live Births               Home Medications    Prior to Admission medications   Medication Sig Start Date End Date Taking? Authorizing Provider  albuterol (PROVENTIL HFA;VENTOLIN HFA) 108 (90 Base) MCG/ACT inhaler Inhale 2 puffs into the lungs as needed.   Yes [provider]  cinacalcet (SENSIPAR) 60 MG tablet Take 60 mg by mouth at bedtime.    Yes [provider]  clopidogrel (PLAVIX) 75 MG tablet Take 1 tablet (75 mg total) by mouth daily. Patient taking differently: Take 75 mg by mouth at bedtime.  01/23/17  Yes Mikell, Jeani Sow, MD  famotidine (PEPCID) 20 MG tablet Take 20 mg by mouth 2 (two) times daily. Lunch and dinner   Yes [provider]  megestrol (MEGACE) 40 MG tablet Take one tab BID x 14 days each month for 4 mos. Patient taking differently: Take 80 mg by mouth at bedtime. . 12/03/17  Yes Princess Bruins, MD  midodrine (PROAMATINE) 10 MG tablet Take 1 tablet (10 mg total) by mouth 2 (two) times daily as needed (30 min prior to HD). 08/27/16  Yes Mikell, Jeani Sow, MD  Multiple Vitamins-Minerals (PRORENAL QD PO) Take 1 tablet by mouth at bedtime.    Yes [provider]  sevelamer (RENVELA) 800 MG tablet Take 1,600 mg by mouth 3 (three) times daily with meals.    Yes [provider]  polyethylene glycol (MIRALAX / GLYCOLAX) packet Take 17 g by mouth daily as needed for mild constipation. Patient not taking: Reported on 02/21/2018 02/01/17   Caroline More, DO    Family History Family History  Problem Relation Age of Onset  . Diabetes Mother   . Hypertension Mother   . Heart disease Mother   . Alcohol abuse Mother   . Kidney disease Mother   . Cancer Father        COLON  . Alcohol abuse Father   . Breast cancer Sister 44  . Hypertension Sister   . Kidney disease  Sister   . Hypertension Son   . Kidney disease Son     Social History Social History   Tobacco Use  . Smoking status: Never Smoker  . Smokeless tobacco: Never Used  Substance Use Topics  . Alcohol use: No  . Drug use: No     Allergies   Contrast media [iodinated diagnostic agents]; Ancef [cefazolin]; and Naproxen sodium   Review of Systems Review of Systems  Constitutional: Negative for fever.  HENT:  Negative for sore throat.   Respiratory: Negative for shortness of breath.   Cardiovascular: Positive for leg swelling.  Gastrointestinal: Negative for abdominal pain.  Genitourinary: Negative for dysuria and flank pain.  Musculoskeletal: Negative for back pain.  Skin: Negative for pallor and wound.  All other systems reviewed and are negative.    Physical Exam Updated Vital Signs BP (!) 100/41   Pulse 77   Temp 97.6 F (36.4 C) (Oral)   Resp 13   Ht 5' (1.524 m)   Wt 72.6 kg   SpO2 100%   BMI 31.25 kg/m   Physical Exam Vitals signs and nursing note reviewed.  Constitutional:      General: She is not in acute distress.    Appearance: She is well-developed.  HENT:     Head: Normocephalic and atraumatic.     Mouth/Throat:     Pharynx: No oropharyngeal exudate.  Eyes:     Pupils: Pupils are equal, round, and reactive to light.  Neck:     Musculoskeletal: Normal range of motion.  Cardiovascular:     Rate and Rhythm: Regular rhythm.     Heart sounds: Normal heart sounds.  Pulmonary:     Effort: Pulmonary effort is normal. No respiratory distress.     Breath sounds: Normal breath sounds.  Abdominal:     General: Bowel sounds are normal. There is no distension.     Palpations: Abdomen is soft.     Tenderness: There is no abdominal tenderness.  Musculoskeletal:        General: No deformity.     Left hip: She exhibits tenderness and swelling. She exhibits normal strength.     Right lower leg: No edema.     Left lower leg: No edema.       Legs:  Skin:     General: Skin is warm and dry.  Neurological:     Mental Status: She is alert and oriented to person, place, and time.      ED Treatments / Results  Labs (all labs ordered are listed, but only abnormal results are displayed) Labs Reviewed  CBC WITH DIFFERENTIAL/PLATELET - Abnormal; Notable for the following components:      Result Value   RBC 2.86 (*)    Hemoglobin 9.2 (*)    HCT 27.9 (*)    RDW 16.7 (*)    All other components within normal limits  BASIC METABOLIC PANEL - Abnormal; Notable for the following components:   Chloride 95 (*)    Glucose, Bld 124 (*)    BUN 66 (*)    Creatinine, Ser 11.36 (*)    GFR calc non Af Amer 3 (*)    GFR calc Af Amer 4 (*)    Anion gap 19 (*)    All other components within normal limits    EKG None  Radiology Vas US Duplex Dialysis Access (avf, Avg)  Result Date: 02/21/2018 DIALYSIS ACCESS Reason for Exam: Pain, swelling. Performing Technologist: Maudry Mayhew MHA, RDMS, RVT, RDCS Supporting Technologist: Oda Cogan RDMS, RVT  Examination Guidelines: A complete evaluation includes B-mode imaging, spectral Doppler, color Doppler, and power Doppler as needed of all accessible portions of each vessel. Unilateral testing is considered an integral part of a complete examination. Limited examinations for reoccurring indications may be performed as noted.  Findings:   +--------------------+----------+-----------------+--------+ AVG                 PSV (cm/s)Flow Vol (mL/min)Describe +--------------------+----------+-----------------+--------+ Native artery inflow  122           837                +--------------------+----------+-----------------+--------+ Arterial anastomosis   236                              +--------------------+----------+-----------------+--------+ Prox graft             503                              +--------------------+----------+-----------------+--------+ Mid graft              180                               +--------------------+----------+-----------------+--------+ Distal graft           263                              +--------------------+----------+-----------------+--------+ Venous anastomosis     538                              +--------------------+----------+-----------------+--------+ Venous outflow         339                              +--------------------+----------+-----------------+--------+  Summary: Patent arteriovenous loop graft in left thigh.  No obvious evidence of pseudoaneurysm. Anechoic area with dependent echoes seen in the anterior left thigh measuring at least 9.9 x 5.1 x 10.1 cm. Suggestive of hematoma versus unknown etiology.  *See table(s) above for measurements and observations.   --------------------------------------------------------------------------------   Preliminary     Procedures Procedures (including critical care time)  Medications Ordered in ED Medications  fentaNYL (SUBLIMAZE) injection 50 mcg (50 mcg Intravenous Given 02/21/18 1121)  fentaNYL (SUBLIMAZE) injection 50 mcg (50 mcg Intravenous Given 02/21/18 1247)     Initial Impression / Assessment and Plan / ED Course  I have reviewed the triage vital signs and the nursing notes.  Pertinent labs & imaging results that were available during my care of the patient were reviewed by me and considered in my medical decision making (see chart for details).     Patient presents with left thigh hematoma s/p dialysis, she had two nurses tried to access her graft and she reports worsening swelling. She has tried ice or heat to the area without improvement. Blood work check to r/o any drop in hemoglobin, CBC showed hemoglobin is consistent with previous visits.  BMP showed, is 11.36. Venous duplex showed no aneurysm, ruptured or acute abnormality. At this time patient likely suffering from hematoma. Neuro exam is unremarkable. No leg weakness. Patient instructed to  attend dialysis tomorrow, will wrap leg with ace bandage and pain medication for patient. Return precautions provided.    Final Clinical Impressions(s) / ED Diagnoses   Final diagnoses:  Hematoma of left lower extremity, initial encounter    ED Discharge Orders    None       Janeece Fitting, PA-C 02/21/18 1511    Mesner, Corene Cornea, MD 02/21/18 1525

## 2018-02-21 NOTE — ED Notes (Signed)
Patient verbalizes understanding of discharge instructions. Opportunity for questioning and answers were provided. Armband removed by staff, pt discharged from ED via wheelchair to home.  

## 2018-02-21 NOTE — ED Notes (Signed)
This RN rounded on the patient for blood pressure concerns. Pt states "It always is low but it will come back up. BP cuff readjusted for reading. Primary RN notified.

## 2018-02-21 NOTE — Discharge Instructions (Addendum)
Please apply heat or ice to the area, you may also wear an ace bandage on your left leg but not tight.  Please attempt dialysis tomorrow as instructed.

## 2018-02-22 DIAGNOSIS — D631 Anemia in chronic kidney disease: Secondary | ICD-10-CM | POA: Diagnosis not present

## 2018-02-22 DIAGNOSIS — N2581 Secondary hyperparathyroidism of renal origin: Secondary | ICD-10-CM | POA: Diagnosis not present

## 2018-02-22 DIAGNOSIS — D509 Iron deficiency anemia, unspecified: Secondary | ICD-10-CM | POA: Diagnosis not present

## 2018-02-22 DIAGNOSIS — E8770 Fluid overload, unspecified: Secondary | ICD-10-CM | POA: Diagnosis not present

## 2018-02-22 DIAGNOSIS — N186 End stage renal disease: Secondary | ICD-10-CM | POA: Diagnosis not present

## 2018-02-23 DIAGNOSIS — E8770 Fluid overload, unspecified: Secondary | ICD-10-CM | POA: Diagnosis not present

## 2018-02-23 DIAGNOSIS — D631 Anemia in chronic kidney disease: Secondary | ICD-10-CM | POA: Diagnosis not present

## 2018-02-23 DIAGNOSIS — D509 Iron deficiency anemia, unspecified: Secondary | ICD-10-CM | POA: Diagnosis not present

## 2018-02-23 DIAGNOSIS — N2581 Secondary hyperparathyroidism of renal origin: Secondary | ICD-10-CM | POA: Diagnosis not present

## 2018-02-23 DIAGNOSIS — N186 End stage renal disease: Secondary | ICD-10-CM | POA: Diagnosis not present

## 2018-02-24 ENCOUNTER — Ambulatory Visit (INDEPENDENT_AMBULATORY_CARE_PROVIDER_SITE_OTHER): Payer: Medicare Other

## 2018-02-24 DIAGNOSIS — I639 Cerebral infarction, unspecified: Secondary | ICD-10-CM | POA: Diagnosis not present

## 2018-02-24 NOTE — Progress Notes (Signed)
Carelink Summary Report / Loop Recorder 

## 2018-02-25 DIAGNOSIS — N2581 Secondary hyperparathyroidism of renal origin: Secondary | ICD-10-CM | POA: Diagnosis not present

## 2018-02-25 DIAGNOSIS — E8770 Fluid overload, unspecified: Secondary | ICD-10-CM | POA: Diagnosis not present

## 2018-02-25 DIAGNOSIS — N186 End stage renal disease: Secondary | ICD-10-CM | POA: Diagnosis not present

## 2018-02-25 DIAGNOSIS — D509 Iron deficiency anemia, unspecified: Secondary | ICD-10-CM | POA: Diagnosis not present

## 2018-02-25 DIAGNOSIS — D631 Anemia in chronic kidney disease: Secondary | ICD-10-CM | POA: Diagnosis not present

## 2018-02-25 LAB — CUP PACEART REMOTE DEVICE CHECK
Date Time Interrogation Session: 20191230030530
Implantable Pulse Generator Implant Date: 20180806

## 2018-02-26 DIAGNOSIS — Z992 Dependence on renal dialysis: Secondary | ICD-10-CM | POA: Diagnosis not present

## 2018-02-26 DIAGNOSIS — N186 End stage renal disease: Secondary | ICD-10-CM | POA: Diagnosis not present

## 2018-02-26 DIAGNOSIS — N039 Chronic nephritic syndrome with unspecified morphologic changes: Secondary | ICD-10-CM | POA: Diagnosis not present

## 2018-02-28 DIAGNOSIS — D509 Iron deficiency anemia, unspecified: Secondary | ICD-10-CM | POA: Diagnosis not present

## 2018-02-28 DIAGNOSIS — N186 End stage renal disease: Secondary | ICD-10-CM | POA: Diagnosis not present

## 2018-02-28 DIAGNOSIS — D631 Anemia in chronic kidney disease: Secondary | ICD-10-CM | POA: Diagnosis not present

## 2018-02-28 DIAGNOSIS — N2581 Secondary hyperparathyroidism of renal origin: Secondary | ICD-10-CM | POA: Diagnosis not present

## 2018-03-03 DIAGNOSIS — N186 End stage renal disease: Secondary | ICD-10-CM | POA: Diagnosis not present

## 2018-03-03 DIAGNOSIS — D631 Anemia in chronic kidney disease: Secondary | ICD-10-CM | POA: Diagnosis not present

## 2018-03-03 DIAGNOSIS — D509 Iron deficiency anemia, unspecified: Secondary | ICD-10-CM | POA: Diagnosis not present

## 2018-03-03 DIAGNOSIS — N2581 Secondary hyperparathyroidism of renal origin: Secondary | ICD-10-CM | POA: Diagnosis not present

## 2018-03-05 DIAGNOSIS — N2581 Secondary hyperparathyroidism of renal origin: Secondary | ICD-10-CM | POA: Diagnosis not present

## 2018-03-05 DIAGNOSIS — D631 Anemia in chronic kidney disease: Secondary | ICD-10-CM | POA: Diagnosis not present

## 2018-03-05 DIAGNOSIS — N186 End stage renal disease: Secondary | ICD-10-CM | POA: Diagnosis not present

## 2018-03-05 DIAGNOSIS — D509 Iron deficiency anemia, unspecified: Secondary | ICD-10-CM | POA: Diagnosis not present

## 2018-03-07 DIAGNOSIS — D631 Anemia in chronic kidney disease: Secondary | ICD-10-CM | POA: Diagnosis not present

## 2018-03-07 DIAGNOSIS — D509 Iron deficiency anemia, unspecified: Secondary | ICD-10-CM | POA: Diagnosis not present

## 2018-03-07 DIAGNOSIS — N186 End stage renal disease: Secondary | ICD-10-CM | POA: Diagnosis not present

## 2018-03-07 DIAGNOSIS — N2581 Secondary hyperparathyroidism of renal origin: Secondary | ICD-10-CM | POA: Diagnosis not present

## 2018-03-09 LAB — CUP PACEART REMOTE DEVICE CHECK
Date Time Interrogation Session: 20191127024106
Implantable Pulse Generator Implant Date: 20180806

## 2018-03-10 DIAGNOSIS — N2581 Secondary hyperparathyroidism of renal origin: Secondary | ICD-10-CM | POA: Diagnosis not present

## 2018-03-10 DIAGNOSIS — D509 Iron deficiency anemia, unspecified: Secondary | ICD-10-CM | POA: Diagnosis not present

## 2018-03-10 DIAGNOSIS — D631 Anemia in chronic kidney disease: Secondary | ICD-10-CM | POA: Diagnosis not present

## 2018-03-10 DIAGNOSIS — N186 End stage renal disease: Secondary | ICD-10-CM | POA: Diagnosis not present

## 2018-03-12 DIAGNOSIS — N186 End stage renal disease: Secondary | ICD-10-CM | POA: Diagnosis not present

## 2018-03-12 DIAGNOSIS — D631 Anemia in chronic kidney disease: Secondary | ICD-10-CM | POA: Diagnosis not present

## 2018-03-12 DIAGNOSIS — D509 Iron deficiency anemia, unspecified: Secondary | ICD-10-CM | POA: Diagnosis not present

## 2018-03-12 DIAGNOSIS — N2581 Secondary hyperparathyroidism of renal origin: Secondary | ICD-10-CM | POA: Diagnosis not present

## 2018-03-13 ENCOUNTER — Ambulatory Visit (INDEPENDENT_AMBULATORY_CARE_PROVIDER_SITE_OTHER): Payer: Medicare Other | Admitting: Family Medicine

## 2018-03-13 DIAGNOSIS — I951 Orthostatic hypotension: Secondary | ICD-10-CM | POA: Diagnosis not present

## 2018-03-13 NOTE — Patient Instructions (Addendum)
It was a wonderful seeing you today!  Make sure you continue to drink as much water as allowed and add a little bit of salt into your diet to help your blood pressure.  Also make sure that you go to your local pharmacy and purchase some compression stockings for your legs, this will help get the blood back to your heart.  We may need to consider having you on midodrine at all times, please discuss this with your kidney doctor as well.  Please make sure you follow-up in the next few weeks if your symptoms are not improving or worsening, we will continue with further evaluation at that time.  Please feel free to call the office if you have any questions or concerns.

## 2018-03-13 NOTE — Progress Notes (Signed)
Subjective:    Patient ID: [name] Marks, female    DOB: 1955-04-15, 63 y.o.   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  However she has also noticed that sometimes when she is sitting up for long periods at a time she will become lightheaded.  She states the dizziness feels more like "woozy ", rather than the room spinning.  She states last week when she got out of her car and stood up, she felt she started walking sideways with a little bit of tunnel vision.  This resolved shortly after she stood for a minute.  She has not fallen or almost fallen during any of these episodes.  Last a minute or so at a time and then resolve.  She is very concerned that she continues to feel lightheaded occasionally.  She was also recently told by her nephrologist during HD that she continues to have low blood pressure.  She has not been drinking much water, but is been informed that she can drink up to 40 ounces a day.  Already takes midodrine during HD to keep her blood pressure up.  She denies any recent illness, fever, loss of consciousness, palpitations, chest pain, shortness of breath, weakness/numbness, or headache.  States around her fistula is still swollen after infiltrating, but denies any erythema, drainage or rash around the area.   Smoking status reviewed  Review of Systems Per HPI, also denies recent illness, fever, headache, changes in vision, chest pain, shortness of breath, abdominal pain, N/V/D, weakness   Patient Active Problem List   Diagnosis Date Noted  . Orthostatic hypotension 03/14/2018  . S/P cholecystectomy 02/14/2017  . Cholelithiases 01/28/2017  . ESRD (end stage renal disease)  ([place]) 01/03/2017  . Osteoporosis 12/11/2016  . Cholelithiasis 11/07/2016  . Cerebral embolism with transient ischemic attack (TIA)   . TIA (transient ischemic attack)   . AVD (aortic valve disease) 07/12/2016  . Pulmonary HTN ([place]) 07/12/2016  . Carpal tunnel syndrome 07/05/2016  . 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 CVA (cerebral vascular accident) ([place]) 05/16/2016  . History of aortic valve replacement with bioprosthetic valve 05/08/2016  . Aortic valve prosthesis present 02/25/2016  . Bacteremia due to coagulase-negative Staphylococcus   . S/P dialysis catheter insertion ([place])   . Neck pain   . History of endocarditis   . Anemia   . Renal dialysis device, implant, or graft complication 08/67/6195  . Pain and swelling of right upper extremity 12/20/2015  . Complex endometrial hyperplasia with atypia 11/08/2015  . Increased endometrial stripe thickness 11/03/2015  . Obesity 10/28/2014  . Health care maintenance 10/28/2014  . Right carotid bruit 01/08/2013  . Generalized headaches 01/08/2013  . Chronic female pelvic pain 08/08/2012  . Gout, unspecified 12/06/2008  . Esophageal reflux 12/06/2008  . Backache 12/06/2008  . FIBROIDS, UTERUS 11/24/2008  . ESRD (end stage renal disease) on dialysis ([place]) 07/01/2006     Objective:  BP (!) 90/48 (BP Location: Left Arm, Cuff Size: Large)   Pulse 88   Temp 98.3 F (36.8 C) (Oral)   Wt 165 lb 12.8 oz (75.2 kg)   SpO2 100%   BMI 32.38 kg/m  Vitals and nursing note reviewed  Orthostatic vitals:  Laying: 90/40, pulse 88 Sitting: 80/44, pulse 86 Standing: 70/40, pulse 85 Experienced lightheadedness once moving from sitting to standing.  General: NAD, pleasant HEENT: Mucous membranes dry Cardiac: RRR, normal heart sounds, 2/6 systolic murmur Respiratory: CTAB, normal effort Abdomen: soft, nontender, nondistended Extremities: no edema or cyanosis. WWP. Skin: warm and dry, no rashes noted Neuro: alert and oriented, no focal  deficits Psych: normal affect  Assessment & Plan:    Orthostatic hypotension SBP difference of 20 from laying to standing, and experiencing lightheadedness with position changes.  Likely multifactorial in the setting of ESRD, intravascular depletion, and age.  Lightheadedness unlikely related to arrhythmia, pace monitoring device interrogated on 12/30 without any evidence of arrhythmias/tachy/or bradycardia episodes. Unlikely secondary to aortic stenosis, valve replaced in 2017. Encourage patient to drink as much water as appropriate for her renal restrictions, add more salt to her diet, and to start wearing compression stockings.  Also encouraged slow transitions from sitting to standing. - Strict return precautions were discussed, patient to return to care if symptoms not improving or worsening - May have to consider starting midodrine at all times if she continues to remain hypotensive   Follow-up if symptoms are not improving or worsening.  Strict return to care precautions were discussed.  Will discuss vaccinations and colonoscopy on next visit.   Darrelyn Hillock, DO  Family Medicine Resident PGY-1

## 2018-03-14 DIAGNOSIS — D631 Anemia in chronic kidney disease: Secondary | ICD-10-CM | POA: Diagnosis not present

## 2018-03-14 DIAGNOSIS — D509 Iron deficiency anemia, unspecified: Secondary | ICD-10-CM | POA: Diagnosis not present

## 2018-03-14 DIAGNOSIS — I951 Orthostatic hypotension: Secondary | ICD-10-CM | POA: Insufficient documentation

## 2018-03-14 DIAGNOSIS — N2581 Secondary hyperparathyroidism of renal origin: Secondary | ICD-10-CM | POA: Diagnosis not present

## 2018-03-14 DIAGNOSIS — N186 End stage renal disease: Secondary | ICD-10-CM | POA: Diagnosis not present

## 2018-03-14 NOTE — Assessment & Plan Note (Addendum)
SBP difference of 20 from laying to standing, and experiencing lightheadedness with position changes.  Likely multifactorial in the setting of ESRD, intravascular depletion, and age.  Lightheadedness unlikely related to arrhythmia, pace monitoring device interrogated on 12/30 without any evidence of arrhythmias/tachy/or bradycardia episodes. Unlikely secondary to aortic stenosis, valve replaced in 2017. Encourage patient to drink as much water as appropriate for her renal restrictions, add more salt to her diet, and to start wearing compression stockings.  Also encouraged slow transitions from sitting to standing. - Strict return precautions were discussed, patient to return to care if symptoms not improving or worsening - May have to consider starting midodrine at all times if she continues to remain hypotensive

## 2018-03-17 DIAGNOSIS — D631 Anemia in chronic kidney disease: Secondary | ICD-10-CM | POA: Diagnosis not present

## 2018-03-17 DIAGNOSIS — D509 Iron deficiency anemia, unspecified: Secondary | ICD-10-CM | POA: Diagnosis not present

## 2018-03-17 DIAGNOSIS — N2581 Secondary hyperparathyroidism of renal origin: Secondary | ICD-10-CM | POA: Diagnosis not present

## 2018-03-17 DIAGNOSIS — N186 End stage renal disease: Secondary | ICD-10-CM | POA: Diagnosis not present

## 2018-03-19 DIAGNOSIS — N2581 Secondary hyperparathyroidism of renal origin: Secondary | ICD-10-CM | POA: Diagnosis not present

## 2018-03-19 DIAGNOSIS — D509 Iron deficiency anemia, unspecified: Secondary | ICD-10-CM | POA: Diagnosis not present

## 2018-03-19 DIAGNOSIS — N186 End stage renal disease: Secondary | ICD-10-CM | POA: Diagnosis not present

## 2018-03-19 DIAGNOSIS — D631 Anemia in chronic kidney disease: Secondary | ICD-10-CM | POA: Diagnosis not present

## 2018-03-21 DIAGNOSIS — D631 Anemia in chronic kidney disease: Secondary | ICD-10-CM | POA: Diagnosis not present

## 2018-03-21 DIAGNOSIS — N2581 Secondary hyperparathyroidism of renal origin: Secondary | ICD-10-CM | POA: Diagnosis not present

## 2018-03-21 DIAGNOSIS — D509 Iron deficiency anemia, unspecified: Secondary | ICD-10-CM | POA: Diagnosis not present

## 2018-03-21 DIAGNOSIS — N186 End stage renal disease: Secondary | ICD-10-CM | POA: Diagnosis not present

## 2018-03-24 DIAGNOSIS — D631 Anemia in chronic kidney disease: Secondary | ICD-10-CM | POA: Diagnosis not present

## 2018-03-24 DIAGNOSIS — N186 End stage renal disease: Secondary | ICD-10-CM | POA: Diagnosis not present

## 2018-03-24 DIAGNOSIS — D509 Iron deficiency anemia, unspecified: Secondary | ICD-10-CM | POA: Diagnosis not present

## 2018-03-24 DIAGNOSIS — N2581 Secondary hyperparathyroidism of renal origin: Secondary | ICD-10-CM | POA: Diagnosis not present

## 2018-03-25 ENCOUNTER — Encounter: Payer: Self-pay | Admitting: Obstetrics & Gynecology

## 2018-03-25 ENCOUNTER — Ambulatory Visit (INDEPENDENT_AMBULATORY_CARE_PROVIDER_SITE_OTHER): Payer: Medicare Other | Admitting: Obstetrics & Gynecology

## 2018-03-25 VITALS — BP 116/70

## 2018-03-25 DIAGNOSIS — N8501 Benign endometrial hyperplasia: Secondary | ICD-10-CM

## 2018-03-25 NOTE — Progress Notes (Signed)
    [name] Marks [birth year] [id number]        63 y.o.  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  No PMB.  No pelvic pain.   OB History  Gravida Para Term Preterm AB Living  2 2       2   SAB TAB Ectopic Multiple Live Births               # Outcome Date GA Lbr Len/2nd Weight Sex Delivery Anes PTL Lv  2 Para           1 Para             Past medical history,surgical history, problem list, medications, allergies, family history and social history were all reviewed and documented in the EPIC chart.   Directed ROS with pertinent positives and negatives documented in the history of present illness/assessment and plan.  Exam:  Vitals:   03/25/18 1504  BP: 116/70   General appearance:  Normal  Endometrial Biopsy procedure:  Verbal informed consent obtained.    Gynecologic exam: Vulva normal.  Speculum:  Cervix normal/vagina normal.  No blood, normal secretions. Betadine prep.  Hurricane spray on cervix.  Easy insertion of Endometrial Bx canula.  EBx done by suction/curettage of all endometrial surfaced.  Specimen moderate quantity.  Sent to pathology.  Speculum removed.  Procedure well tolerated, no Cx.   Assessment/Plan:  63 y.o. G2P2   1. Simple endometrial hyperplasia without atypia On Megace since diagnosis of simple hyperplasia of the endometrium without atypia in October 2019.  No postmenopausal bleeding since then.  No pelvic pain.  An endometrial biopsy was repeated today without difficulty or complication.  Pending pathology.  Will manage per results. - US Transvaginal Non-OB; Future  Counseling on above issues and coordination of care more than 50% for 15 minutes.  Princess Bruins MD, 3:20 PM 03/25/2018

## 2018-03-26 DIAGNOSIS — D631 Anemia in chronic kidney disease: Secondary | ICD-10-CM | POA: Diagnosis not present

## 2018-03-26 DIAGNOSIS — N186 End stage renal disease: Secondary | ICD-10-CM | POA: Diagnosis not present

## 2018-03-26 DIAGNOSIS — N2581 Secondary hyperparathyroidism of renal origin: Secondary | ICD-10-CM | POA: Diagnosis not present

## 2018-03-26 DIAGNOSIS — D509 Iron deficiency anemia, unspecified: Secondary | ICD-10-CM | POA: Diagnosis not present

## 2018-03-27 DIAGNOSIS — Z6832 Body mass index (BMI) 32.0-32.9, adult: Secondary | ICD-10-CM | POA: Diagnosis not present

## 2018-03-27 DIAGNOSIS — M4310 Spondylolisthesis, site unspecified: Secondary | ICD-10-CM | POA: Diagnosis not present

## 2018-03-27 LAB — PATHOLOGY

## 2018-03-27 LAB — TISSUE SPECIMEN

## 2018-03-28 ENCOUNTER — Ambulatory Visit (INDEPENDENT_AMBULATORY_CARE_PROVIDER_SITE_OTHER): Payer: Medicare Other

## 2018-03-28 DIAGNOSIS — I639 Cerebral infarction, unspecified: Secondary | ICD-10-CM

## 2018-03-28 DIAGNOSIS — N2581 Secondary hyperparathyroidism of renal origin: Secondary | ICD-10-CM | POA: Diagnosis not present

## 2018-03-28 DIAGNOSIS — D631 Anemia in chronic kidney disease: Secondary | ICD-10-CM | POA: Diagnosis not present

## 2018-03-28 DIAGNOSIS — D509 Iron deficiency anemia, unspecified: Secondary | ICD-10-CM | POA: Diagnosis not present

## 2018-03-28 DIAGNOSIS — N186 End stage renal disease: Secondary | ICD-10-CM | POA: Diagnosis not present

## 2018-03-29 DIAGNOSIS — N039 Chronic nephritic syndrome with unspecified morphologic changes: Secondary | ICD-10-CM | POA: Diagnosis not present

## 2018-03-29 DIAGNOSIS — Z992 Dependence on renal dialysis: Secondary | ICD-10-CM | POA: Diagnosis not present

## 2018-03-29 DIAGNOSIS — N186 End stage renal disease: Secondary | ICD-10-CM | POA: Diagnosis not present

## 2018-03-31 ENCOUNTER — Encounter: Payer: Self-pay | Admitting: Obstetrics & Gynecology

## 2018-03-31 DIAGNOSIS — N186 End stage renal disease: Secondary | ICD-10-CM | POA: Diagnosis not present

## 2018-03-31 DIAGNOSIS — D631 Anemia in chronic kidney disease: Secondary | ICD-10-CM | POA: Diagnosis not present

## 2018-03-31 DIAGNOSIS — N2581 Secondary hyperparathyroidism of renal origin: Secondary | ICD-10-CM | POA: Diagnosis not present

## 2018-03-31 DIAGNOSIS — D509 Iron deficiency anemia, unspecified: Secondary | ICD-10-CM | POA: Diagnosis not present

## 2018-03-31 NOTE — Patient Instructions (Signed)
1. Simple endometrial hyperplasia without atypia On Megace since diagnosis of simple hyperplasia of the endometrium without atypia in October 2019.  No postmenopausal bleeding since then.  No pelvic pain.  An endometrial biopsy was repeated today without difficulty or complication.  Pending pathology.  Will manage per results. - US Transvaginal Non-OB; Future  Khadejah, it was a pleasure seeing you today!  I will inform you of your results as soon as they are available.

## 2018-04-01 ENCOUNTER — Other Ambulatory Visit: Payer: Self-pay | Admitting: Neurosurgery

## 2018-04-01 DIAGNOSIS — M810 Age-related osteoporosis without current pathological fracture: Secondary | ICD-10-CM

## 2018-04-02 DIAGNOSIS — D509 Iron deficiency anemia, unspecified: Secondary | ICD-10-CM | POA: Diagnosis not present

## 2018-04-02 DIAGNOSIS — D631 Anemia in chronic kidney disease: Secondary | ICD-10-CM | POA: Diagnosis not present

## 2018-04-02 DIAGNOSIS — N186 End stage renal disease: Secondary | ICD-10-CM | POA: Diagnosis not present

## 2018-04-02 DIAGNOSIS — N2581 Secondary hyperparathyroidism of renal origin: Secondary | ICD-10-CM | POA: Diagnosis not present

## 2018-04-02 LAB — CUP PACEART REMOTE DEVICE CHECK
Date Time Interrogation Session: 20200201092055
MDC IDC PG IMPLANT DT: 20180806

## 2018-04-04 DIAGNOSIS — D509 Iron deficiency anemia, unspecified: Secondary | ICD-10-CM | POA: Diagnosis not present

## 2018-04-04 DIAGNOSIS — N2581 Secondary hyperparathyroidism of renal origin: Secondary | ICD-10-CM | POA: Diagnosis not present

## 2018-04-04 DIAGNOSIS — N186 End stage renal disease: Secondary | ICD-10-CM | POA: Diagnosis not present

## 2018-04-04 DIAGNOSIS — D631 Anemia in chronic kidney disease: Secondary | ICD-10-CM | POA: Diagnosis not present

## 2018-04-04 NOTE — Progress Notes (Signed)
Carelink Summary Report / Loop Recorder 

## 2018-04-07 DIAGNOSIS — N186 End stage renal disease: Secondary | ICD-10-CM | POA: Diagnosis not present

## 2018-04-07 DIAGNOSIS — D631 Anemia in chronic kidney disease: Secondary | ICD-10-CM | POA: Diagnosis not present

## 2018-04-07 DIAGNOSIS — N2581 Secondary hyperparathyroidism of renal origin: Secondary | ICD-10-CM | POA: Diagnosis not present

## 2018-04-07 DIAGNOSIS — D509 Iron deficiency anemia, unspecified: Secondary | ICD-10-CM | POA: Diagnosis not present

## 2018-04-09 DIAGNOSIS — N186 End stage renal disease: Secondary | ICD-10-CM | POA: Diagnosis not present

## 2018-04-09 DIAGNOSIS — N2581 Secondary hyperparathyroidism of renal origin: Secondary | ICD-10-CM | POA: Diagnosis not present

## 2018-04-09 DIAGNOSIS — D631 Anemia in chronic kidney disease: Secondary | ICD-10-CM | POA: Diagnosis not present

## 2018-04-09 DIAGNOSIS — D509 Iron deficiency anemia, unspecified: Secondary | ICD-10-CM | POA: Diagnosis not present

## 2018-04-11 DIAGNOSIS — N2581 Secondary hyperparathyroidism of renal origin: Secondary | ICD-10-CM | POA: Diagnosis not present

## 2018-04-11 DIAGNOSIS — D631 Anemia in chronic kidney disease: Secondary | ICD-10-CM | POA: Diagnosis not present

## 2018-04-11 DIAGNOSIS — D509 Iron deficiency anemia, unspecified: Secondary | ICD-10-CM | POA: Diagnosis not present

## 2018-04-11 DIAGNOSIS — N186 End stage renal disease: Secondary | ICD-10-CM | POA: Diagnosis not present

## 2018-04-14 DIAGNOSIS — D509 Iron deficiency anemia, unspecified: Secondary | ICD-10-CM | POA: Diagnosis not present

## 2018-04-14 DIAGNOSIS — N186 End stage renal disease: Secondary | ICD-10-CM | POA: Diagnosis not present

## 2018-04-14 DIAGNOSIS — N2581 Secondary hyperparathyroidism of renal origin: Secondary | ICD-10-CM | POA: Diagnosis not present

## 2018-04-14 DIAGNOSIS — D631 Anemia in chronic kidney disease: Secondary | ICD-10-CM | POA: Diagnosis not present

## 2018-04-16 DIAGNOSIS — N2581 Secondary hyperparathyroidism of renal origin: Secondary | ICD-10-CM | POA: Diagnosis not present

## 2018-04-16 DIAGNOSIS — D631 Anemia in chronic kidney disease: Secondary | ICD-10-CM | POA: Diagnosis not present

## 2018-04-16 DIAGNOSIS — N186 End stage renal disease: Secondary | ICD-10-CM | POA: Diagnosis not present

## 2018-04-16 DIAGNOSIS — D509 Iron deficiency anemia, unspecified: Secondary | ICD-10-CM | POA: Diagnosis not present

## 2018-04-18 DIAGNOSIS — N186 End stage renal disease: Secondary | ICD-10-CM | POA: Diagnosis not present

## 2018-04-18 DIAGNOSIS — N2581 Secondary hyperparathyroidism of renal origin: Secondary | ICD-10-CM | POA: Diagnosis not present

## 2018-04-18 DIAGNOSIS — D509 Iron deficiency anemia, unspecified: Secondary | ICD-10-CM | POA: Diagnosis not present

## 2018-04-18 DIAGNOSIS — D631 Anemia in chronic kidney disease: Secondary | ICD-10-CM | POA: Diagnosis not present

## 2018-04-21 DIAGNOSIS — D631 Anemia in chronic kidney disease: Secondary | ICD-10-CM | POA: Diagnosis not present

## 2018-04-21 DIAGNOSIS — N186 End stage renal disease: Secondary | ICD-10-CM | POA: Diagnosis not present

## 2018-04-21 DIAGNOSIS — N2581 Secondary hyperparathyroidism of renal origin: Secondary | ICD-10-CM | POA: Diagnosis not present

## 2018-04-21 DIAGNOSIS — D509 Iron deficiency anemia, unspecified: Secondary | ICD-10-CM | POA: Diagnosis not present

## 2018-04-22 DIAGNOSIS — N186 End stage renal disease: Secondary | ICD-10-CM | POA: Diagnosis not present

## 2018-04-22 DIAGNOSIS — T82858A Stenosis of vascular prosthetic devices, implants and grafts, initial encounter: Secondary | ICD-10-CM | POA: Diagnosis not present

## 2018-04-22 DIAGNOSIS — I871 Compression of vein: Secondary | ICD-10-CM | POA: Diagnosis not present

## 2018-04-22 DIAGNOSIS — Z992 Dependence on renal dialysis: Secondary | ICD-10-CM | POA: Diagnosis not present

## 2018-04-23 DIAGNOSIS — N186 End stage renal disease: Secondary | ICD-10-CM | POA: Diagnosis not present

## 2018-04-23 DIAGNOSIS — N2581 Secondary hyperparathyroidism of renal origin: Secondary | ICD-10-CM | POA: Diagnosis not present

## 2018-04-23 DIAGNOSIS — D631 Anemia in chronic kidney disease: Secondary | ICD-10-CM | POA: Diagnosis not present

## 2018-04-23 DIAGNOSIS — D509 Iron deficiency anemia, unspecified: Secondary | ICD-10-CM | POA: Diagnosis not present

## 2018-04-24 ENCOUNTER — Telehealth: Payer: Self-pay | Admitting: *Deleted

## 2018-04-24 DIAGNOSIS — M8000XA Age-related osteoporosis with current pathological fracture, unspecified site, initial encounter for fracture: Secondary | ICD-10-CM

## 2018-04-24 NOTE — Telephone Encounter (Signed)
patient called and spoke with claudia stating Dr. Trenton Gammon would like her to have dexa, per Dr.Gherge note on 12/11/2016  " I advised her to contact Dr. Phineas Real so he can order her bone mineral density scan on the same machine next year and to forward the results to me will see pt back in a year, but will let her know if any changes are needed in her supplements after I review the labs from her nephrologist."  Order placed

## 2018-04-25 DIAGNOSIS — N2581 Secondary hyperparathyroidism of renal origin: Secondary | ICD-10-CM | POA: Diagnosis not present

## 2018-04-25 DIAGNOSIS — D509 Iron deficiency anemia, unspecified: Secondary | ICD-10-CM | POA: Diagnosis not present

## 2018-04-25 DIAGNOSIS — D631 Anemia in chronic kidney disease: Secondary | ICD-10-CM | POA: Diagnosis not present

## 2018-04-25 DIAGNOSIS — N186 End stage renal disease: Secondary | ICD-10-CM | POA: Diagnosis not present

## 2018-04-27 DIAGNOSIS — N039 Chronic nephritic syndrome with unspecified morphologic changes: Secondary | ICD-10-CM | POA: Diagnosis not present

## 2018-04-27 DIAGNOSIS — Z992 Dependence on renal dialysis: Secondary | ICD-10-CM | POA: Diagnosis not present

## 2018-04-27 DIAGNOSIS — N186 End stage renal disease: Secondary | ICD-10-CM | POA: Diagnosis not present

## 2018-04-28 DIAGNOSIS — D509 Iron deficiency anemia, unspecified: Secondary | ICD-10-CM | POA: Diagnosis not present

## 2018-04-28 DIAGNOSIS — N2581 Secondary hyperparathyroidism of renal origin: Secondary | ICD-10-CM | POA: Diagnosis not present

## 2018-04-28 DIAGNOSIS — N186 End stage renal disease: Secondary | ICD-10-CM | POA: Diagnosis not present

## 2018-04-28 DIAGNOSIS — D631 Anemia in chronic kidney disease: Secondary | ICD-10-CM | POA: Diagnosis not present

## 2018-04-30 ENCOUNTER — Ambulatory Visit (INDEPENDENT_AMBULATORY_CARE_PROVIDER_SITE_OTHER): Payer: Medicare Other | Admitting: *Deleted

## 2018-04-30 DIAGNOSIS — D509 Iron deficiency anemia, unspecified: Secondary | ICD-10-CM | POA: Diagnosis not present

## 2018-04-30 DIAGNOSIS — N186 End stage renal disease: Secondary | ICD-10-CM | POA: Diagnosis not present

## 2018-04-30 DIAGNOSIS — I639 Cerebral infarction, unspecified: Secondary | ICD-10-CM | POA: Diagnosis not present

## 2018-04-30 DIAGNOSIS — D631 Anemia in chronic kidney disease: Secondary | ICD-10-CM | POA: Diagnosis not present

## 2018-04-30 DIAGNOSIS — N2581 Secondary hyperparathyroidism of renal origin: Secondary | ICD-10-CM | POA: Diagnosis not present

## 2018-05-01 LAB — CUP PACEART REMOTE DEVICE CHECK
Date Time Interrogation Session: 20200305104159
MDC IDC PG IMPLANT DT: 20180806

## 2018-05-02 DIAGNOSIS — D509 Iron deficiency anemia, unspecified: Secondary | ICD-10-CM | POA: Diagnosis not present

## 2018-05-02 DIAGNOSIS — D631 Anemia in chronic kidney disease: Secondary | ICD-10-CM | POA: Diagnosis not present

## 2018-05-02 DIAGNOSIS — N2581 Secondary hyperparathyroidism of renal origin: Secondary | ICD-10-CM | POA: Diagnosis not present

## 2018-05-02 DIAGNOSIS — N186 End stage renal disease: Secondary | ICD-10-CM | POA: Diagnosis not present

## 2018-05-05 DIAGNOSIS — D631 Anemia in chronic kidney disease: Secondary | ICD-10-CM | POA: Diagnosis not present

## 2018-05-05 DIAGNOSIS — D509 Iron deficiency anemia, unspecified: Secondary | ICD-10-CM | POA: Diagnosis not present

## 2018-05-05 DIAGNOSIS — N2581 Secondary hyperparathyroidism of renal origin: Secondary | ICD-10-CM | POA: Diagnosis not present

## 2018-05-05 DIAGNOSIS — N186 End stage renal disease: Secondary | ICD-10-CM | POA: Diagnosis not present

## 2018-05-07 DIAGNOSIS — D631 Anemia in chronic kidney disease: Secondary | ICD-10-CM | POA: Diagnosis not present

## 2018-05-07 DIAGNOSIS — D509 Iron deficiency anemia, unspecified: Secondary | ICD-10-CM | POA: Diagnosis not present

## 2018-05-07 DIAGNOSIS — N186 End stage renal disease: Secondary | ICD-10-CM | POA: Diagnosis not present

## 2018-05-07 DIAGNOSIS — N2581 Secondary hyperparathyroidism of renal origin: Secondary | ICD-10-CM | POA: Diagnosis not present

## 2018-05-08 NOTE — Progress Notes (Signed)
Carelink Summary Report / Loop Recorder 

## 2018-05-09 DIAGNOSIS — D509 Iron deficiency anemia, unspecified: Secondary | ICD-10-CM | POA: Diagnosis not present

## 2018-05-09 DIAGNOSIS — N186 End stage renal disease: Secondary | ICD-10-CM | POA: Diagnosis not present

## 2018-05-09 DIAGNOSIS — D631 Anemia in chronic kidney disease: Secondary | ICD-10-CM | POA: Diagnosis not present

## 2018-05-09 DIAGNOSIS — N2581 Secondary hyperparathyroidism of renal origin: Secondary | ICD-10-CM | POA: Diagnosis not present

## 2018-05-12 DIAGNOSIS — D631 Anemia in chronic kidney disease: Secondary | ICD-10-CM | POA: Diagnosis not present

## 2018-05-12 DIAGNOSIS — N186 End stage renal disease: Secondary | ICD-10-CM | POA: Diagnosis not present

## 2018-05-12 DIAGNOSIS — N2581 Secondary hyperparathyroidism of renal origin: Secondary | ICD-10-CM | POA: Diagnosis not present

## 2018-05-12 DIAGNOSIS — D509 Iron deficiency anemia, unspecified: Secondary | ICD-10-CM | POA: Diagnosis not present

## 2018-05-14 DIAGNOSIS — N186 End stage renal disease: Secondary | ICD-10-CM | POA: Diagnosis not present

## 2018-05-14 DIAGNOSIS — D509 Iron deficiency anemia, unspecified: Secondary | ICD-10-CM | POA: Diagnosis not present

## 2018-05-14 DIAGNOSIS — D631 Anemia in chronic kidney disease: Secondary | ICD-10-CM | POA: Diagnosis not present

## 2018-05-14 DIAGNOSIS — N2581 Secondary hyperparathyroidism of renal origin: Secondary | ICD-10-CM | POA: Diagnosis not present

## 2018-05-16 DIAGNOSIS — N2581 Secondary hyperparathyroidism of renal origin: Secondary | ICD-10-CM | POA: Diagnosis not present

## 2018-05-16 DIAGNOSIS — D509 Iron deficiency anemia, unspecified: Secondary | ICD-10-CM | POA: Diagnosis not present

## 2018-05-16 DIAGNOSIS — D631 Anemia in chronic kidney disease: Secondary | ICD-10-CM | POA: Diagnosis not present

## 2018-05-16 DIAGNOSIS — N186 End stage renal disease: Secondary | ICD-10-CM | POA: Diagnosis not present

## 2018-05-19 DIAGNOSIS — N2581 Secondary hyperparathyroidism of renal origin: Secondary | ICD-10-CM | POA: Diagnosis not present

## 2018-05-19 DIAGNOSIS — D509 Iron deficiency anemia, unspecified: Secondary | ICD-10-CM | POA: Diagnosis not present

## 2018-05-19 DIAGNOSIS — N186 End stage renal disease: Secondary | ICD-10-CM | POA: Diagnosis not present

## 2018-05-19 DIAGNOSIS — D631 Anemia in chronic kidney disease: Secondary | ICD-10-CM | POA: Diagnosis not present

## 2018-05-21 DIAGNOSIS — D509 Iron deficiency anemia, unspecified: Secondary | ICD-10-CM | POA: Diagnosis not present

## 2018-05-21 DIAGNOSIS — N186 End stage renal disease: Secondary | ICD-10-CM | POA: Diagnosis not present

## 2018-05-21 DIAGNOSIS — N2581 Secondary hyperparathyroidism of renal origin: Secondary | ICD-10-CM | POA: Diagnosis not present

## 2018-05-21 DIAGNOSIS — D631 Anemia in chronic kidney disease: Secondary | ICD-10-CM | POA: Diagnosis not present

## 2018-05-23 DIAGNOSIS — D509 Iron deficiency anemia, unspecified: Secondary | ICD-10-CM | POA: Diagnosis not present

## 2018-05-23 DIAGNOSIS — N2581 Secondary hyperparathyroidism of renal origin: Secondary | ICD-10-CM | POA: Diagnosis not present

## 2018-05-23 DIAGNOSIS — D631 Anemia in chronic kidney disease: Secondary | ICD-10-CM | POA: Diagnosis not present

## 2018-05-23 DIAGNOSIS — N186 End stage renal disease: Secondary | ICD-10-CM | POA: Diagnosis not present

## 2018-05-26 DIAGNOSIS — D631 Anemia in chronic kidney disease: Secondary | ICD-10-CM | POA: Diagnosis not present

## 2018-05-26 DIAGNOSIS — D509 Iron deficiency anemia, unspecified: Secondary | ICD-10-CM | POA: Diagnosis not present

## 2018-05-26 DIAGNOSIS — N186 End stage renal disease: Secondary | ICD-10-CM | POA: Diagnosis not present

## 2018-05-26 DIAGNOSIS — N2581 Secondary hyperparathyroidism of renal origin: Secondary | ICD-10-CM | POA: Diagnosis not present

## 2018-05-28 DIAGNOSIS — Z992 Dependence on renal dialysis: Secondary | ICD-10-CM | POA: Diagnosis not present

## 2018-05-28 DIAGNOSIS — D631 Anemia in chronic kidney disease: Secondary | ICD-10-CM | POA: Diagnosis not present

## 2018-05-28 DIAGNOSIS — N186 End stage renal disease: Secondary | ICD-10-CM | POA: Diagnosis not present

## 2018-05-28 DIAGNOSIS — N039 Chronic nephritic syndrome with unspecified morphologic changes: Secondary | ICD-10-CM | POA: Diagnosis not present

## 2018-05-28 DIAGNOSIS — N2581 Secondary hyperparathyroidism of renal origin: Secondary | ICD-10-CM | POA: Diagnosis not present

## 2018-05-28 DIAGNOSIS — D509 Iron deficiency anemia, unspecified: Secondary | ICD-10-CM | POA: Diagnosis not present

## 2018-05-30 DIAGNOSIS — D631 Anemia in chronic kidney disease: Secondary | ICD-10-CM | POA: Diagnosis not present

## 2018-05-30 DIAGNOSIS — D509 Iron deficiency anemia, unspecified: Secondary | ICD-10-CM | POA: Diagnosis not present

## 2018-05-30 DIAGNOSIS — N186 End stage renal disease: Secondary | ICD-10-CM | POA: Diagnosis not present

## 2018-05-30 DIAGNOSIS — N2581 Secondary hyperparathyroidism of renal origin: Secondary | ICD-10-CM | POA: Diagnosis not present

## 2018-06-02 DIAGNOSIS — D631 Anemia in chronic kidney disease: Secondary | ICD-10-CM | POA: Diagnosis not present

## 2018-06-02 DIAGNOSIS — N186 End stage renal disease: Secondary | ICD-10-CM | POA: Diagnosis not present

## 2018-06-02 DIAGNOSIS — N2581 Secondary hyperparathyroidism of renal origin: Secondary | ICD-10-CM | POA: Diagnosis not present

## 2018-06-02 DIAGNOSIS — D509 Iron deficiency anemia, unspecified: Secondary | ICD-10-CM | POA: Diagnosis not present

## 2018-06-03 ENCOUNTER — Other Ambulatory Visit: Payer: Self-pay

## 2018-06-03 ENCOUNTER — Ambulatory Visit (INDEPENDENT_AMBULATORY_CARE_PROVIDER_SITE_OTHER): Payer: Medicare Other | Admitting: *Deleted

## 2018-06-03 DIAGNOSIS — I639 Cerebral infarction, unspecified: Secondary | ICD-10-CM

## 2018-06-03 LAB — CUP PACEART REMOTE DEVICE CHECK
Date Time Interrogation Session: 20200407114101
Implantable Pulse Generator Implant Date: 20180806

## 2018-06-04 DIAGNOSIS — D509 Iron deficiency anemia, unspecified: Secondary | ICD-10-CM | POA: Diagnosis not present

## 2018-06-04 DIAGNOSIS — N2581 Secondary hyperparathyroidism of renal origin: Secondary | ICD-10-CM | POA: Diagnosis not present

## 2018-06-04 DIAGNOSIS — D631 Anemia in chronic kidney disease: Secondary | ICD-10-CM | POA: Diagnosis not present

## 2018-06-04 DIAGNOSIS — N186 End stage renal disease: Secondary | ICD-10-CM | POA: Diagnosis not present

## 2018-06-06 DIAGNOSIS — N186 End stage renal disease: Secondary | ICD-10-CM | POA: Diagnosis not present

## 2018-06-06 DIAGNOSIS — D631 Anemia in chronic kidney disease: Secondary | ICD-10-CM | POA: Diagnosis not present

## 2018-06-06 DIAGNOSIS — N2581 Secondary hyperparathyroidism of renal origin: Secondary | ICD-10-CM | POA: Diagnosis not present

## 2018-06-06 DIAGNOSIS — D509 Iron deficiency anemia, unspecified: Secondary | ICD-10-CM | POA: Diagnosis not present

## 2018-06-09 DIAGNOSIS — N2581 Secondary hyperparathyroidism of renal origin: Secondary | ICD-10-CM | POA: Diagnosis not present

## 2018-06-09 DIAGNOSIS — D509 Iron deficiency anemia, unspecified: Secondary | ICD-10-CM | POA: Diagnosis not present

## 2018-06-09 DIAGNOSIS — N186 End stage renal disease: Secondary | ICD-10-CM | POA: Diagnosis not present

## 2018-06-09 DIAGNOSIS — D631 Anemia in chronic kidney disease: Secondary | ICD-10-CM | POA: Diagnosis not present

## 2018-06-11 DIAGNOSIS — D631 Anemia in chronic kidney disease: Secondary | ICD-10-CM | POA: Diagnosis not present

## 2018-06-11 DIAGNOSIS — N2581 Secondary hyperparathyroidism of renal origin: Secondary | ICD-10-CM | POA: Diagnosis not present

## 2018-06-11 DIAGNOSIS — N186 End stage renal disease: Secondary | ICD-10-CM | POA: Diagnosis not present

## 2018-06-11 DIAGNOSIS — D509 Iron deficiency anemia, unspecified: Secondary | ICD-10-CM | POA: Diagnosis not present

## 2018-06-12 NOTE — Progress Notes (Signed)
Carelink Summary Report / Loop Recorder 

## 2018-06-13 DIAGNOSIS — N186 End stage renal disease: Secondary | ICD-10-CM | POA: Diagnosis not present

## 2018-06-13 DIAGNOSIS — D631 Anemia in chronic kidney disease: Secondary | ICD-10-CM | POA: Diagnosis not present

## 2018-06-13 DIAGNOSIS — D509 Iron deficiency anemia, unspecified: Secondary | ICD-10-CM | POA: Diagnosis not present

## 2018-06-13 DIAGNOSIS — N2581 Secondary hyperparathyroidism of renal origin: Secondary | ICD-10-CM | POA: Diagnosis not present

## 2018-06-16 DIAGNOSIS — N186 End stage renal disease: Secondary | ICD-10-CM | POA: Diagnosis not present

## 2018-06-16 DIAGNOSIS — D509 Iron deficiency anemia, unspecified: Secondary | ICD-10-CM | POA: Diagnosis not present

## 2018-06-16 DIAGNOSIS — D631 Anemia in chronic kidney disease: Secondary | ICD-10-CM | POA: Diagnosis not present

## 2018-06-16 DIAGNOSIS — N2581 Secondary hyperparathyroidism of renal origin: Secondary | ICD-10-CM | POA: Diagnosis not present

## 2018-06-18 DIAGNOSIS — D631 Anemia in chronic kidney disease: Secondary | ICD-10-CM | POA: Diagnosis not present

## 2018-06-18 DIAGNOSIS — N2581 Secondary hyperparathyroidism of renal origin: Secondary | ICD-10-CM | POA: Diagnosis not present

## 2018-06-18 DIAGNOSIS — N186 End stage renal disease: Secondary | ICD-10-CM | POA: Diagnosis not present

## 2018-06-18 DIAGNOSIS — D509 Iron deficiency anemia, unspecified: Secondary | ICD-10-CM | POA: Diagnosis not present

## 2018-06-20 DIAGNOSIS — N186 End stage renal disease: Secondary | ICD-10-CM | POA: Diagnosis not present

## 2018-06-20 DIAGNOSIS — D509 Iron deficiency anemia, unspecified: Secondary | ICD-10-CM | POA: Diagnosis not present

## 2018-06-20 DIAGNOSIS — N2581 Secondary hyperparathyroidism of renal origin: Secondary | ICD-10-CM | POA: Diagnosis not present

## 2018-06-20 DIAGNOSIS — D631 Anemia in chronic kidney disease: Secondary | ICD-10-CM | POA: Diagnosis not present

## 2018-06-23 DIAGNOSIS — N2581 Secondary hyperparathyroidism of renal origin: Secondary | ICD-10-CM | POA: Diagnosis not present

## 2018-06-23 DIAGNOSIS — D509 Iron deficiency anemia, unspecified: Secondary | ICD-10-CM | POA: Diagnosis not present

## 2018-06-23 DIAGNOSIS — D631 Anemia in chronic kidney disease: Secondary | ICD-10-CM | POA: Diagnosis not present

## 2018-06-23 DIAGNOSIS — N186 End stage renal disease: Secondary | ICD-10-CM | POA: Diagnosis not present

## 2018-06-25 DIAGNOSIS — D509 Iron deficiency anemia, unspecified: Secondary | ICD-10-CM | POA: Diagnosis not present

## 2018-06-25 DIAGNOSIS — N2581 Secondary hyperparathyroidism of renal origin: Secondary | ICD-10-CM | POA: Diagnosis not present

## 2018-06-25 DIAGNOSIS — N186 End stage renal disease: Secondary | ICD-10-CM | POA: Diagnosis not present

## 2018-06-25 DIAGNOSIS — D631 Anemia in chronic kidney disease: Secondary | ICD-10-CM | POA: Diagnosis not present

## 2018-06-27 DIAGNOSIS — N039 Chronic nephritic syndrome with unspecified morphologic changes: Secondary | ICD-10-CM | POA: Diagnosis not present

## 2018-06-27 DIAGNOSIS — N2581 Secondary hyperparathyroidism of renal origin: Secondary | ICD-10-CM | POA: Diagnosis not present

## 2018-06-27 DIAGNOSIS — N186 End stage renal disease: Secondary | ICD-10-CM | POA: Diagnosis not present

## 2018-06-27 DIAGNOSIS — D509 Iron deficiency anemia, unspecified: Secondary | ICD-10-CM | POA: Diagnosis not present

## 2018-06-27 DIAGNOSIS — Z992 Dependence on renal dialysis: Secondary | ICD-10-CM | POA: Diagnosis not present

## 2018-06-27 DIAGNOSIS — D631 Anemia in chronic kidney disease: Secondary | ICD-10-CM | POA: Diagnosis not present

## 2018-06-30 DIAGNOSIS — D631 Anemia in chronic kidney disease: Secondary | ICD-10-CM | POA: Diagnosis not present

## 2018-06-30 DIAGNOSIS — N186 End stage renal disease: Secondary | ICD-10-CM | POA: Diagnosis not present

## 2018-06-30 DIAGNOSIS — N2581 Secondary hyperparathyroidism of renal origin: Secondary | ICD-10-CM | POA: Diagnosis not present

## 2018-06-30 DIAGNOSIS — D509 Iron deficiency anemia, unspecified: Secondary | ICD-10-CM | POA: Diagnosis not present

## 2018-07-02 DIAGNOSIS — N2581 Secondary hyperparathyroidism of renal origin: Secondary | ICD-10-CM | POA: Diagnosis not present

## 2018-07-02 DIAGNOSIS — N186 End stage renal disease: Secondary | ICD-10-CM | POA: Diagnosis not present

## 2018-07-02 DIAGNOSIS — D509 Iron deficiency anemia, unspecified: Secondary | ICD-10-CM | POA: Diagnosis not present

## 2018-07-02 DIAGNOSIS — D631 Anemia in chronic kidney disease: Secondary | ICD-10-CM | POA: Diagnosis not present

## 2018-07-04 DIAGNOSIS — N186 End stage renal disease: Secondary | ICD-10-CM | POA: Diagnosis not present

## 2018-07-04 DIAGNOSIS — D509 Iron deficiency anemia, unspecified: Secondary | ICD-10-CM | POA: Diagnosis not present

## 2018-07-04 DIAGNOSIS — D631 Anemia in chronic kidney disease: Secondary | ICD-10-CM | POA: Diagnosis not present

## 2018-07-04 DIAGNOSIS — N2581 Secondary hyperparathyroidism of renal origin: Secondary | ICD-10-CM | POA: Diagnosis not present

## 2018-07-07 ENCOUNTER — Ambulatory Visit (INDEPENDENT_AMBULATORY_CARE_PROVIDER_SITE_OTHER): Payer: Medicare Other | Admitting: *Deleted

## 2018-07-07 ENCOUNTER — Other Ambulatory Visit: Payer: Self-pay

## 2018-07-07 DIAGNOSIS — I639 Cerebral infarction, unspecified: Secondary | ICD-10-CM | POA: Diagnosis not present

## 2018-07-07 DIAGNOSIS — D509 Iron deficiency anemia, unspecified: Secondary | ICD-10-CM | POA: Diagnosis not present

## 2018-07-07 DIAGNOSIS — N186 End stage renal disease: Secondary | ICD-10-CM | POA: Diagnosis not present

## 2018-07-07 DIAGNOSIS — D631 Anemia in chronic kidney disease: Secondary | ICD-10-CM | POA: Diagnosis not present

## 2018-07-07 DIAGNOSIS — N2581 Secondary hyperparathyroidism of renal origin: Secondary | ICD-10-CM | POA: Diagnosis not present

## 2018-07-07 LAB — CUP PACEART REMOTE DEVICE CHECK
Date Time Interrogation Session: 20200510124101
Implantable Pulse Generator Implant Date: 20180806

## 2018-07-09 DIAGNOSIS — N2581 Secondary hyperparathyroidism of renal origin: Secondary | ICD-10-CM | POA: Diagnosis not present

## 2018-07-09 DIAGNOSIS — D509 Iron deficiency anemia, unspecified: Secondary | ICD-10-CM | POA: Diagnosis not present

## 2018-07-09 DIAGNOSIS — N186 End stage renal disease: Secondary | ICD-10-CM | POA: Diagnosis not present

## 2018-07-09 DIAGNOSIS — D631 Anemia in chronic kidney disease: Secondary | ICD-10-CM | POA: Diagnosis not present

## 2018-07-11 DIAGNOSIS — N2581 Secondary hyperparathyroidism of renal origin: Secondary | ICD-10-CM | POA: Diagnosis not present

## 2018-07-11 DIAGNOSIS — N186 End stage renal disease: Secondary | ICD-10-CM | POA: Diagnosis not present

## 2018-07-11 DIAGNOSIS — D631 Anemia in chronic kidney disease: Secondary | ICD-10-CM | POA: Diagnosis not present

## 2018-07-11 DIAGNOSIS — D509 Iron deficiency anemia, unspecified: Secondary | ICD-10-CM | POA: Diagnosis not present

## 2018-07-14 DIAGNOSIS — D631 Anemia in chronic kidney disease: Secondary | ICD-10-CM | POA: Diagnosis not present

## 2018-07-14 DIAGNOSIS — N2581 Secondary hyperparathyroidism of renal origin: Secondary | ICD-10-CM | POA: Diagnosis not present

## 2018-07-14 DIAGNOSIS — D509 Iron deficiency anemia, unspecified: Secondary | ICD-10-CM | POA: Diagnosis not present

## 2018-07-14 DIAGNOSIS — N186 End stage renal disease: Secondary | ICD-10-CM | POA: Diagnosis not present

## 2018-07-16 DIAGNOSIS — D509 Iron deficiency anemia, unspecified: Secondary | ICD-10-CM | POA: Diagnosis not present

## 2018-07-16 DIAGNOSIS — N2581 Secondary hyperparathyroidism of renal origin: Secondary | ICD-10-CM | POA: Diagnosis not present

## 2018-07-16 DIAGNOSIS — D631 Anemia in chronic kidney disease: Secondary | ICD-10-CM | POA: Diagnosis not present

## 2018-07-16 DIAGNOSIS — N186 End stage renal disease: Secondary | ICD-10-CM | POA: Diagnosis not present

## 2018-07-16 NOTE — Progress Notes (Signed)
Carelink Summary Report / Loop Recorder 

## 2018-07-18 DIAGNOSIS — N186 End stage renal disease: Secondary | ICD-10-CM | POA: Diagnosis not present

## 2018-07-18 DIAGNOSIS — D631 Anemia in chronic kidney disease: Secondary | ICD-10-CM | POA: Diagnosis not present

## 2018-07-18 DIAGNOSIS — D509 Iron deficiency anemia, unspecified: Secondary | ICD-10-CM | POA: Diagnosis not present

## 2018-07-18 DIAGNOSIS — N2581 Secondary hyperparathyroidism of renal origin: Secondary | ICD-10-CM | POA: Diagnosis not present

## 2018-07-21 DIAGNOSIS — D631 Anemia in chronic kidney disease: Secondary | ICD-10-CM | POA: Diagnosis not present

## 2018-07-21 DIAGNOSIS — N186 End stage renal disease: Secondary | ICD-10-CM | POA: Diagnosis not present

## 2018-07-21 DIAGNOSIS — N2581 Secondary hyperparathyroidism of renal origin: Secondary | ICD-10-CM | POA: Diagnosis not present

## 2018-07-21 DIAGNOSIS — D509 Iron deficiency anemia, unspecified: Secondary | ICD-10-CM | POA: Diagnosis not present

## 2018-07-23 DIAGNOSIS — D509 Iron deficiency anemia, unspecified: Secondary | ICD-10-CM | POA: Diagnosis not present

## 2018-07-23 DIAGNOSIS — N2581 Secondary hyperparathyroidism of renal origin: Secondary | ICD-10-CM | POA: Diagnosis not present

## 2018-07-23 DIAGNOSIS — D631 Anemia in chronic kidney disease: Secondary | ICD-10-CM | POA: Diagnosis not present

## 2018-07-23 DIAGNOSIS — N186 End stage renal disease: Secondary | ICD-10-CM | POA: Diagnosis not present

## 2018-07-25 DIAGNOSIS — D631 Anemia in chronic kidney disease: Secondary | ICD-10-CM | POA: Diagnosis not present

## 2018-07-25 DIAGNOSIS — D509 Iron deficiency anemia, unspecified: Secondary | ICD-10-CM | POA: Diagnosis not present

## 2018-07-25 DIAGNOSIS — N2581 Secondary hyperparathyroidism of renal origin: Secondary | ICD-10-CM | POA: Diagnosis not present

## 2018-07-25 DIAGNOSIS — N186 End stage renal disease: Secondary | ICD-10-CM | POA: Diagnosis not present

## 2018-07-28 DIAGNOSIS — D509 Iron deficiency anemia, unspecified: Secondary | ICD-10-CM | POA: Diagnosis not present

## 2018-07-28 DIAGNOSIS — N2581 Secondary hyperparathyroidism of renal origin: Secondary | ICD-10-CM | POA: Diagnosis not present

## 2018-07-28 DIAGNOSIS — D631 Anemia in chronic kidney disease: Secondary | ICD-10-CM | POA: Diagnosis not present

## 2018-07-28 DIAGNOSIS — N186 End stage renal disease: Secondary | ICD-10-CM | POA: Diagnosis not present

## 2018-07-28 DIAGNOSIS — N039 Chronic nephritic syndrome with unspecified morphologic changes: Secondary | ICD-10-CM | POA: Diagnosis not present

## 2018-07-28 DIAGNOSIS — Z992 Dependence on renal dialysis: Secondary | ICD-10-CM | POA: Diagnosis not present

## 2018-07-30 DIAGNOSIS — D631 Anemia in chronic kidney disease: Secondary | ICD-10-CM | POA: Diagnosis not present

## 2018-07-30 DIAGNOSIS — D509 Iron deficiency anemia, unspecified: Secondary | ICD-10-CM | POA: Diagnosis not present

## 2018-07-30 DIAGNOSIS — N2581 Secondary hyperparathyroidism of renal origin: Secondary | ICD-10-CM | POA: Diagnosis not present

## 2018-07-30 DIAGNOSIS — N186 End stage renal disease: Secondary | ICD-10-CM | POA: Diagnosis not present

## 2018-08-01 DIAGNOSIS — N2581 Secondary hyperparathyroidism of renal origin: Secondary | ICD-10-CM | POA: Diagnosis not present

## 2018-08-01 DIAGNOSIS — D509 Iron deficiency anemia, unspecified: Secondary | ICD-10-CM | POA: Diagnosis not present

## 2018-08-01 DIAGNOSIS — N186 End stage renal disease: Secondary | ICD-10-CM | POA: Diagnosis not present

## 2018-08-01 DIAGNOSIS — D631 Anemia in chronic kidney disease: Secondary | ICD-10-CM | POA: Diagnosis not present

## 2018-08-04 DIAGNOSIS — D631 Anemia in chronic kidney disease: Secondary | ICD-10-CM | POA: Diagnosis not present

## 2018-08-04 DIAGNOSIS — N186 End stage renal disease: Secondary | ICD-10-CM | POA: Diagnosis not present

## 2018-08-04 DIAGNOSIS — N2581 Secondary hyperparathyroidism of renal origin: Secondary | ICD-10-CM | POA: Diagnosis not present

## 2018-08-04 DIAGNOSIS — D509 Iron deficiency anemia, unspecified: Secondary | ICD-10-CM | POA: Diagnosis not present

## 2018-08-06 DIAGNOSIS — N2581 Secondary hyperparathyroidism of renal origin: Secondary | ICD-10-CM | POA: Diagnosis not present

## 2018-08-06 DIAGNOSIS — D631 Anemia in chronic kidney disease: Secondary | ICD-10-CM | POA: Diagnosis not present

## 2018-08-06 DIAGNOSIS — N186 End stage renal disease: Secondary | ICD-10-CM | POA: Diagnosis not present

## 2018-08-06 DIAGNOSIS — D509 Iron deficiency anemia, unspecified: Secondary | ICD-10-CM | POA: Diagnosis not present

## 2018-08-08 ENCOUNTER — Ambulatory Visit (INDEPENDENT_AMBULATORY_CARE_PROVIDER_SITE_OTHER): Payer: Medicare Other | Admitting: *Deleted

## 2018-08-08 DIAGNOSIS — D631 Anemia in chronic kidney disease: Secondary | ICD-10-CM | POA: Diagnosis not present

## 2018-08-08 DIAGNOSIS — N186 End stage renal disease: Secondary | ICD-10-CM | POA: Diagnosis not present

## 2018-08-08 DIAGNOSIS — D509 Iron deficiency anemia, unspecified: Secondary | ICD-10-CM | POA: Diagnosis not present

## 2018-08-08 DIAGNOSIS — N2581 Secondary hyperparathyroidism of renal origin: Secondary | ICD-10-CM | POA: Diagnosis not present

## 2018-08-08 DIAGNOSIS — I639 Cerebral infarction, unspecified: Secondary | ICD-10-CM | POA: Diagnosis not present

## 2018-08-08 LAB — CUP PACEART REMOTE DEVICE CHECK
Date Time Interrogation Session: 20200612133820
Implantable Pulse Generator Implant Date: 20180806

## 2018-08-11 DIAGNOSIS — D509 Iron deficiency anemia, unspecified: Secondary | ICD-10-CM | POA: Diagnosis not present

## 2018-08-11 DIAGNOSIS — D631 Anemia in chronic kidney disease: Secondary | ICD-10-CM | POA: Diagnosis not present

## 2018-08-11 DIAGNOSIS — N2581 Secondary hyperparathyroidism of renal origin: Secondary | ICD-10-CM | POA: Diagnosis not present

## 2018-08-11 DIAGNOSIS — N186 End stage renal disease: Secondary | ICD-10-CM | POA: Diagnosis not present

## 2018-08-11 NOTE — Progress Notes (Signed)
Carelink Summary Report / Loop Recorder 

## 2018-08-13 DIAGNOSIS — D631 Anemia in chronic kidney disease: Secondary | ICD-10-CM | POA: Diagnosis not present

## 2018-08-13 DIAGNOSIS — N2581 Secondary hyperparathyroidism of renal origin: Secondary | ICD-10-CM | POA: Diagnosis not present

## 2018-08-13 DIAGNOSIS — N186 End stage renal disease: Secondary | ICD-10-CM | POA: Diagnosis not present

## 2018-08-13 DIAGNOSIS — D509 Iron deficiency anemia, unspecified: Secondary | ICD-10-CM | POA: Diagnosis not present

## 2018-08-14 ENCOUNTER — Other Ambulatory Visit: Payer: Self-pay

## 2018-08-14 ENCOUNTER — Ambulatory Visit: Payer: Medicare Other

## 2018-08-15 DIAGNOSIS — D509 Iron deficiency anemia, unspecified: Secondary | ICD-10-CM | POA: Diagnosis not present

## 2018-08-15 DIAGNOSIS — N186 End stage renal disease: Secondary | ICD-10-CM | POA: Diagnosis not present

## 2018-08-15 DIAGNOSIS — N2581 Secondary hyperparathyroidism of renal origin: Secondary | ICD-10-CM | POA: Diagnosis not present

## 2018-08-15 DIAGNOSIS — D631 Anemia in chronic kidney disease: Secondary | ICD-10-CM | POA: Diagnosis not present

## 2018-08-18 DIAGNOSIS — D509 Iron deficiency anemia, unspecified: Secondary | ICD-10-CM | POA: Diagnosis not present

## 2018-08-18 DIAGNOSIS — D631 Anemia in chronic kidney disease: Secondary | ICD-10-CM | POA: Diagnosis not present

## 2018-08-18 DIAGNOSIS — N186 End stage renal disease: Secondary | ICD-10-CM | POA: Diagnosis not present

## 2018-08-18 DIAGNOSIS — N2581 Secondary hyperparathyroidism of renal origin: Secondary | ICD-10-CM | POA: Diagnosis not present

## 2018-08-20 DIAGNOSIS — N2581 Secondary hyperparathyroidism of renal origin: Secondary | ICD-10-CM | POA: Diagnosis not present

## 2018-08-20 DIAGNOSIS — D631 Anemia in chronic kidney disease: Secondary | ICD-10-CM | POA: Diagnosis not present

## 2018-08-20 DIAGNOSIS — D509 Iron deficiency anemia, unspecified: Secondary | ICD-10-CM | POA: Diagnosis not present

## 2018-08-20 DIAGNOSIS — N186 End stage renal disease: Secondary | ICD-10-CM | POA: Diagnosis not present

## 2018-08-22 ENCOUNTER — Telehealth: Payer: Self-pay

## 2018-08-22 DIAGNOSIS — N186 End stage renal disease: Secondary | ICD-10-CM | POA: Diagnosis not present

## 2018-08-22 DIAGNOSIS — D631 Anemia in chronic kidney disease: Secondary | ICD-10-CM | POA: Diagnosis not present

## 2018-08-22 DIAGNOSIS — N2581 Secondary hyperparathyroidism of renal origin: Secondary | ICD-10-CM | POA: Diagnosis not present

## 2018-08-22 DIAGNOSIS — D509 Iron deficiency anemia, unspecified: Secondary | ICD-10-CM | POA: Diagnosis not present

## 2018-08-22 NOTE — Telephone Encounter (Signed)
Left message for patient to remind of missed remote transmission.  

## 2018-08-25 ENCOUNTER — Telehealth: Payer: Self-pay

## 2018-08-25 DIAGNOSIS — N2581 Secondary hyperparathyroidism of renal origin: Secondary | ICD-10-CM | POA: Diagnosis not present

## 2018-08-25 DIAGNOSIS — R197 Diarrhea, unspecified: Secondary | ICD-10-CM | POA: Insufficient documentation

## 2018-08-25 DIAGNOSIS — D509 Iron deficiency anemia, unspecified: Secondary | ICD-10-CM | POA: Diagnosis not present

## 2018-08-25 DIAGNOSIS — N186 End stage renal disease: Secondary | ICD-10-CM | POA: Diagnosis not present

## 2018-08-25 DIAGNOSIS — D631 Anemia in chronic kidney disease: Secondary | ICD-10-CM | POA: Diagnosis not present

## 2018-08-25 NOTE — Telephone Encounter (Signed)
Pt monitor sent an alert with no details. I had the pt send a manual transmission with her home monitor. Transmission received. I told her once the nurse look at her transmission she will give her a call back.

## 2018-08-25 NOTE — Telephone Encounter (Signed)
Pt notified LINQ transmission received and no issues at this time.  2 AF episode alerts 2 minutes in duration each that appear to be false. Episodes appear to be SR.

## 2018-08-27 DIAGNOSIS — Z992 Dependence on renal dialysis: Secondary | ICD-10-CM | POA: Diagnosis not present

## 2018-08-27 DIAGNOSIS — N186 End stage renal disease: Secondary | ICD-10-CM | POA: Diagnosis not present

## 2018-08-27 DIAGNOSIS — N039 Chronic nephritic syndrome with unspecified morphologic changes: Secondary | ICD-10-CM | POA: Diagnosis not present

## 2018-08-27 DIAGNOSIS — D509 Iron deficiency anemia, unspecified: Secondary | ICD-10-CM | POA: Diagnosis not present

## 2018-08-27 DIAGNOSIS — D631 Anemia in chronic kidney disease: Secondary | ICD-10-CM | POA: Diagnosis not present

## 2018-08-27 DIAGNOSIS — N2581 Secondary hyperparathyroidism of renal origin: Secondary | ICD-10-CM | POA: Diagnosis not present

## 2018-08-29 DIAGNOSIS — N186 End stage renal disease: Secondary | ICD-10-CM | POA: Diagnosis not present

## 2018-08-29 DIAGNOSIS — D631 Anemia in chronic kidney disease: Secondary | ICD-10-CM | POA: Diagnosis not present

## 2018-08-29 DIAGNOSIS — N2581 Secondary hyperparathyroidism of renal origin: Secondary | ICD-10-CM | POA: Diagnosis not present

## 2018-08-29 DIAGNOSIS — D509 Iron deficiency anemia, unspecified: Secondary | ICD-10-CM | POA: Diagnosis not present

## 2018-09-01 DIAGNOSIS — D631 Anemia in chronic kidney disease: Secondary | ICD-10-CM | POA: Diagnosis not present

## 2018-09-01 DIAGNOSIS — N2581 Secondary hyperparathyroidism of renal origin: Secondary | ICD-10-CM | POA: Diagnosis not present

## 2018-09-01 DIAGNOSIS — N186 End stage renal disease: Secondary | ICD-10-CM | POA: Diagnosis not present

## 2018-09-01 DIAGNOSIS — D509 Iron deficiency anemia, unspecified: Secondary | ICD-10-CM | POA: Diagnosis not present

## 2018-09-03 DIAGNOSIS — D509 Iron deficiency anemia, unspecified: Secondary | ICD-10-CM | POA: Diagnosis not present

## 2018-09-03 DIAGNOSIS — N186 End stage renal disease: Secondary | ICD-10-CM | POA: Diagnosis not present

## 2018-09-03 DIAGNOSIS — N2581 Secondary hyperparathyroidism of renal origin: Secondary | ICD-10-CM | POA: Diagnosis not present

## 2018-09-03 DIAGNOSIS — D631 Anemia in chronic kidney disease: Secondary | ICD-10-CM | POA: Diagnosis not present

## 2018-09-05 DIAGNOSIS — D509 Iron deficiency anemia, unspecified: Secondary | ICD-10-CM | POA: Diagnosis not present

## 2018-09-05 DIAGNOSIS — N2581 Secondary hyperparathyroidism of renal origin: Secondary | ICD-10-CM | POA: Diagnosis not present

## 2018-09-05 DIAGNOSIS — N186 End stage renal disease: Secondary | ICD-10-CM | POA: Diagnosis not present

## 2018-09-05 DIAGNOSIS — D631 Anemia in chronic kidney disease: Secondary | ICD-10-CM | POA: Diagnosis not present

## 2018-09-08 DIAGNOSIS — N186 End stage renal disease: Secondary | ICD-10-CM | POA: Diagnosis not present

## 2018-09-08 DIAGNOSIS — D631 Anemia in chronic kidney disease: Secondary | ICD-10-CM | POA: Diagnosis not present

## 2018-09-08 DIAGNOSIS — D509 Iron deficiency anemia, unspecified: Secondary | ICD-10-CM | POA: Diagnosis not present

## 2018-09-08 DIAGNOSIS — N2581 Secondary hyperparathyroidism of renal origin: Secondary | ICD-10-CM | POA: Diagnosis not present

## 2018-09-09 ENCOUNTER — Ambulatory Visit (INDEPENDENT_AMBULATORY_CARE_PROVIDER_SITE_OTHER): Payer: Medicare Other | Admitting: *Deleted

## 2018-09-09 DIAGNOSIS — I639 Cerebral infarction, unspecified: Secondary | ICD-10-CM

## 2018-09-10 ENCOUNTER — Ambulatory Visit (INDEPENDENT_AMBULATORY_CARE_PROVIDER_SITE_OTHER): Payer: Medicare Other | Admitting: *Deleted

## 2018-09-10 DIAGNOSIS — I639 Cerebral infarction, unspecified: Secondary | ICD-10-CM | POA: Diagnosis not present

## 2018-09-10 DIAGNOSIS — D509 Iron deficiency anemia, unspecified: Secondary | ICD-10-CM | POA: Diagnosis not present

## 2018-09-10 DIAGNOSIS — N2581 Secondary hyperparathyroidism of renal origin: Secondary | ICD-10-CM | POA: Diagnosis not present

## 2018-09-10 DIAGNOSIS — D631 Anemia in chronic kidney disease: Secondary | ICD-10-CM | POA: Diagnosis not present

## 2018-09-10 DIAGNOSIS — N186 End stage renal disease: Secondary | ICD-10-CM | POA: Diagnosis not present

## 2018-09-10 LAB — CUP PACEART REMOTE DEVICE CHECK
Date Time Interrogation Session: 20200715144220
Implantable Pulse Generator Implant Date: 20180806

## 2018-09-12 DIAGNOSIS — D509 Iron deficiency anemia, unspecified: Secondary | ICD-10-CM | POA: Diagnosis not present

## 2018-09-12 DIAGNOSIS — D631 Anemia in chronic kidney disease: Secondary | ICD-10-CM | POA: Diagnosis not present

## 2018-09-12 DIAGNOSIS — N186 End stage renal disease: Secondary | ICD-10-CM | POA: Diagnosis not present

## 2018-09-12 DIAGNOSIS — N2581 Secondary hyperparathyroidism of renal origin: Secondary | ICD-10-CM | POA: Diagnosis not present

## 2018-09-15 DIAGNOSIS — D509 Iron deficiency anemia, unspecified: Secondary | ICD-10-CM | POA: Diagnosis not present

## 2018-09-15 DIAGNOSIS — N2581 Secondary hyperparathyroidism of renal origin: Secondary | ICD-10-CM | POA: Diagnosis not present

## 2018-09-15 DIAGNOSIS — N186 End stage renal disease: Secondary | ICD-10-CM | POA: Diagnosis not present

## 2018-09-15 DIAGNOSIS — D631 Anemia in chronic kidney disease: Secondary | ICD-10-CM | POA: Diagnosis not present

## 2018-09-17 DIAGNOSIS — D509 Iron deficiency anemia, unspecified: Secondary | ICD-10-CM | POA: Diagnosis not present

## 2018-09-17 DIAGNOSIS — D631 Anemia in chronic kidney disease: Secondary | ICD-10-CM | POA: Diagnosis not present

## 2018-09-17 DIAGNOSIS — N2581 Secondary hyperparathyroidism of renal origin: Secondary | ICD-10-CM | POA: Diagnosis not present

## 2018-09-17 DIAGNOSIS — N186 End stage renal disease: Secondary | ICD-10-CM | POA: Diagnosis not present

## 2018-09-19 DIAGNOSIS — N186 End stage renal disease: Secondary | ICD-10-CM | POA: Diagnosis not present

## 2018-09-19 DIAGNOSIS — D509 Iron deficiency anemia, unspecified: Secondary | ICD-10-CM | POA: Diagnosis not present

## 2018-09-19 DIAGNOSIS — N2581 Secondary hyperparathyroidism of renal origin: Secondary | ICD-10-CM | POA: Diagnosis not present

## 2018-09-19 DIAGNOSIS — D631 Anemia in chronic kidney disease: Secondary | ICD-10-CM | POA: Diagnosis not present

## 2018-09-20 NOTE — Progress Notes (Signed)
Carelink Summary Report / Loop Recorder 

## 2018-09-22 DIAGNOSIS — N186 End stage renal disease: Secondary | ICD-10-CM | POA: Diagnosis not present

## 2018-09-22 DIAGNOSIS — D631 Anemia in chronic kidney disease: Secondary | ICD-10-CM | POA: Diagnosis not present

## 2018-09-22 DIAGNOSIS — D509 Iron deficiency anemia, unspecified: Secondary | ICD-10-CM | POA: Diagnosis not present

## 2018-09-22 DIAGNOSIS — N2581 Secondary hyperparathyroidism of renal origin: Secondary | ICD-10-CM | POA: Diagnosis not present

## 2018-09-24 DIAGNOSIS — N2581 Secondary hyperparathyroidism of renal origin: Secondary | ICD-10-CM | POA: Diagnosis not present

## 2018-09-24 DIAGNOSIS — D631 Anemia in chronic kidney disease: Secondary | ICD-10-CM | POA: Diagnosis not present

## 2018-09-24 DIAGNOSIS — N186 End stage renal disease: Secondary | ICD-10-CM | POA: Diagnosis not present

## 2018-09-24 DIAGNOSIS — D509 Iron deficiency anemia, unspecified: Secondary | ICD-10-CM | POA: Diagnosis not present

## 2018-09-24 NOTE — Progress Notes (Signed)
Carelink Summary Report / Loop Recorder 

## 2018-09-26 DIAGNOSIS — N2581 Secondary hyperparathyroidism of renal origin: Secondary | ICD-10-CM | POA: Diagnosis not present

## 2018-09-26 DIAGNOSIS — D509 Iron deficiency anemia, unspecified: Secondary | ICD-10-CM | POA: Diagnosis not present

## 2018-09-26 DIAGNOSIS — N186 End stage renal disease: Secondary | ICD-10-CM | POA: Diagnosis not present

## 2018-09-26 DIAGNOSIS — D631 Anemia in chronic kidney disease: Secondary | ICD-10-CM | POA: Diagnosis not present

## 2018-09-27 DIAGNOSIS — N186 End stage renal disease: Secondary | ICD-10-CM | POA: Diagnosis not present

## 2018-09-27 DIAGNOSIS — N039 Chronic nephritic syndrome with unspecified morphologic changes: Secondary | ICD-10-CM | POA: Diagnosis not present

## 2018-09-27 DIAGNOSIS — Z992 Dependence on renal dialysis: Secondary | ICD-10-CM | POA: Diagnosis not present

## 2018-09-29 DIAGNOSIS — D509 Iron deficiency anemia, unspecified: Secondary | ICD-10-CM | POA: Diagnosis not present

## 2018-09-29 DIAGNOSIS — D631 Anemia in chronic kidney disease: Secondary | ICD-10-CM | POA: Diagnosis not present

## 2018-09-29 DIAGNOSIS — N186 End stage renal disease: Secondary | ICD-10-CM | POA: Diagnosis not present

## 2018-09-29 DIAGNOSIS — N2581 Secondary hyperparathyroidism of renal origin: Secondary | ICD-10-CM | POA: Diagnosis not present

## 2018-09-29 DIAGNOSIS — Z992 Dependence on renal dialysis: Secondary | ICD-10-CM | POA: Diagnosis not present

## 2018-09-30 ENCOUNTER — Encounter: Payer: Medicare Other | Admitting: Obstetrics & Gynecology

## 2018-09-30 DIAGNOSIS — T82858A Stenosis of vascular prosthetic devices, implants and grafts, initial encounter: Secondary | ICD-10-CM | POA: Diagnosis not present

## 2018-09-30 DIAGNOSIS — N186 End stage renal disease: Secondary | ICD-10-CM | POA: Diagnosis not present

## 2018-09-30 DIAGNOSIS — I871 Compression of vein: Secondary | ICD-10-CM | POA: Diagnosis not present

## 2018-09-30 DIAGNOSIS — Z992 Dependence on renal dialysis: Secondary | ICD-10-CM | POA: Diagnosis not present

## 2018-10-01 DIAGNOSIS — Z992 Dependence on renal dialysis: Secondary | ICD-10-CM | POA: Diagnosis not present

## 2018-10-01 DIAGNOSIS — D631 Anemia in chronic kidney disease: Secondary | ICD-10-CM | POA: Diagnosis not present

## 2018-10-01 DIAGNOSIS — D509 Iron deficiency anemia, unspecified: Secondary | ICD-10-CM | POA: Diagnosis not present

## 2018-10-01 DIAGNOSIS — N186 End stage renal disease: Secondary | ICD-10-CM | POA: Diagnosis not present

## 2018-10-01 DIAGNOSIS — N2581 Secondary hyperparathyroidism of renal origin: Secondary | ICD-10-CM | POA: Diagnosis not present

## 2018-10-02 ENCOUNTER — Other Ambulatory Visit: Payer: Medicare Other

## 2018-10-02 ENCOUNTER — Ambulatory Visit: Payer: Medicare Other | Admitting: Obstetrics & Gynecology

## 2018-10-03 ENCOUNTER — Encounter (HOSPITAL_COMMUNITY): Payer: Self-pay | Admitting: Emergency Medicine

## 2018-10-03 ENCOUNTER — Observation Stay (HOSPITAL_COMMUNITY)
Admission: EM | Admit: 2018-10-03 | Discharge: 2018-10-04 | Disposition: A | Discharge status: Home / Self Care | Payer: Medicare Other | Attending: Family Medicine | Admitting: Family Medicine

## 2018-10-03 ENCOUNTER — Emergency Department (HOSPITAL_COMMUNITY): Payer: Medicare Other

## 2018-10-03 ENCOUNTER — Other Ambulatory Visit: Payer: Self-pay

## 2018-10-03 DIAGNOSIS — D631 Anemia in chronic kidney disease: Secondary | ICD-10-CM | POA: Insufficient documentation

## 2018-10-03 DIAGNOSIS — I953 Hypotension of hemodialysis: Principal | ICD-10-CM | POA: Insufficient documentation

## 2018-10-03 DIAGNOSIS — Z8673 Personal history of transient ischemic attack (TIA), and cerebral infarction without residual deficits: Secondary | ICD-10-CM | POA: Insufficient documentation

## 2018-10-03 DIAGNOSIS — Z953 Presence of xenogenic heart valve: Secondary | ICD-10-CM | POA: Insufficient documentation

## 2018-10-03 DIAGNOSIS — Z7902 Long term (current) use of antithrombotics/antiplatelets: Secondary | ICD-10-CM | POA: Diagnosis not present

## 2018-10-03 DIAGNOSIS — J90 Pleural effusion, not elsewhere classified: Secondary | ICD-10-CM | POA: Diagnosis not present

## 2018-10-03 DIAGNOSIS — K219 Gastro-esophageal reflux disease without esophagitis: Secondary | ICD-10-CM | POA: Insufficient documentation

## 2018-10-03 DIAGNOSIS — Z20828 Contact with and (suspected) exposure to other viral communicable diseases: Secondary | ICD-10-CM | POA: Insufficient documentation

## 2018-10-03 DIAGNOSIS — Z9049 Acquired absence of other specified parts of digestive tract: Secondary | ICD-10-CM | POA: Diagnosis not present

## 2018-10-03 DIAGNOSIS — Z992 Dependence on renal dialysis: Secondary | ICD-10-CM

## 2018-10-03 DIAGNOSIS — Z881 Allergy status to other antibiotic agents status: Secondary | ICD-10-CM | POA: Insufficient documentation

## 2018-10-03 DIAGNOSIS — Z79899 Other long term (current) drug therapy: Secondary | ICD-10-CM | POA: Diagnosis not present

## 2018-10-03 DIAGNOSIS — Z888 Allergy status to other drugs, medicaments and biological substances status: Secondary | ICD-10-CM | POA: Diagnosis not present

## 2018-10-03 DIAGNOSIS — Z841 Family history of disorders of kidney and ureter: Secondary | ICD-10-CM | POA: Insufficient documentation

## 2018-10-03 DIAGNOSIS — Z0184 Encounter for antibody response examination: Secondary | ICD-10-CM | POA: Insufficient documentation

## 2018-10-03 DIAGNOSIS — I959 Hypotension, unspecified: Secondary | ICD-10-CM | POA: Diagnosis present

## 2018-10-03 DIAGNOSIS — I951 Orthostatic hypotension: Secondary | ICD-10-CM | POA: Diagnosis not present

## 2018-10-03 DIAGNOSIS — N186 End stage renal disease: Secondary | ICD-10-CM | POA: Insufficient documentation

## 2018-10-03 DIAGNOSIS — R531 Weakness: Secondary | ICD-10-CM | POA: Diagnosis not present

## 2018-10-03 DIAGNOSIS — D509 Iron deficiency anemia, unspecified: Secondary | ICD-10-CM | POA: Diagnosis not present

## 2018-10-03 DIAGNOSIS — N2581 Secondary hyperparathyroidism of renal origin: Secondary | ICD-10-CM | POA: Diagnosis not present

## 2018-10-03 LAB — TROPONIN I (HIGH SENSITIVITY)
Troponin I (High Sensitivity): 14 ng/L (ref <18)
Troponin I (High Sensitivity): 7 ng/L (ref <18)

## 2018-10-03 LAB — CBC
HCT: 33 % — ABNORMAL LOW (ref 36.0–46.0)
Hemoglobin: 10.4 g/dL — ABNORMAL LOW (ref 12.0–15.0)
MCH: 29 pg (ref 26.0–34.0)
MCHC: 31.5 g/dL (ref 30.0–36.0)
MCV: 91.9 fL (ref 80.0–100.0)
Platelets: 260 10*3/uL (ref 150–400)
RBC: 3.59 MIL/uL — ABNORMAL LOW (ref 3.87–5.11)
RDW: 15.9 % — ABNORMAL HIGH (ref 11.5–15.5)
WBC: 9.1 10*3/uL (ref 4.0–10.5)
nRBC: 0 % (ref 0.0–0.2)

## 2018-10-03 LAB — BASIC METABOLIC PANEL
Anion gap: 15 (ref 5–15)
BUN: 15 mg/dL (ref 8–23)
CO2: 29 mmol/L (ref 22–32)
Calcium: 8.5 mg/dL — ABNORMAL LOW (ref 8.9–10.3)
Chloride: 94 mmol/L — ABNORMAL LOW (ref 98–111)
Creatinine, Ser: 4.89 mg/dL — ABNORMAL HIGH (ref 0.44–1.00)
GFR calc Af Amer: 10 mL/min — ABNORMAL LOW (ref >60)
GFR calc non Af Amer: 9 mL/min — ABNORMAL LOW (ref >60)
Glucose, Bld: 99 mg/dL (ref 70–99)
Potassium: 4.7 mmol/L (ref 3.5–5.1)
Sodium: 138 mmol/L (ref 135–145)

## 2018-10-03 LAB — CBG MONITORING, ED: Glucose-Capillary: 90 mg/dL (ref 70–99)

## 2018-10-03 MED ORDER — SODIUM CHLORIDE 0.9 % IV BOLUS
250.0000 mL | Freq: Once | INTRAVENOUS | Status: AC
  Administered 2018-10-03: 250 mL via INTRAVENOUS

## 2018-10-03 MED ORDER — ACETAMINOPHEN 650 MG RE SUPP: 650.0000 mg | Freq: Four times a day (QID) | RECTAL | Status: DC | PRN

## 2018-10-03 MED ORDER — SODIUM CHLORIDE 0.9% FLUSH
3.0000 mL | Freq: Once | INTRAVENOUS | Status: AC
  Administered 2018-10-03: 3 mL via INTRAVENOUS

## 2018-10-03 MED ORDER — ALBUTEROL SULFATE (2.5 MG/3ML) 0.083% IN NEBU: 3.0000 mL | INHALATION_SOLUTION | RESPIRATORY_TRACT | Status: DC | PRN

## 2018-10-03 MED ORDER — MIDODRINE HCL 5 MG PO TABS: 10.0000 mg | ORAL_TABLET | Freq: Three times a day (TID) | ORAL | Status: DC

## 2018-10-03 MED ORDER — CLOPIDOGREL BISULFATE 75 MG PO TABS
75.0000 mg | ORAL_TABLET | Freq: Every day | ORAL | Status: DC
  Administered 2018-10-03 – 2018-10-04 (×2): 75 mg via ORAL
  Filled 2018-10-03 (×2): qty 1

## 2018-10-03 MED ORDER — HEPARIN SODIUM (PORCINE) 5000 UNIT/ML IJ SOLN
5000.0000 [IU] | Freq: Three times a day (TID) | INTRAMUSCULAR | Status: DC
  Administered 2018-10-03 – 2018-10-04 (×3): 5000 [IU] via SUBCUTANEOUS
  Filled 2018-10-03 (×3): qty 1

## 2018-10-03 MED ORDER — MIDODRINE HCL 5 MG PO TABS
10.0000 mg | ORAL_TABLET | Freq: Once | ORAL | Status: AC
  Administered 2018-10-03: 10 mg via ORAL
  Filled 2018-10-03: qty 2

## 2018-10-03 MED ORDER — ACETAMINOPHEN 325 MG PO TABS: 650.0000 mg | ORAL_TABLET | Freq: Four times a day (QID) | ORAL | Status: DC | PRN

## 2018-10-03 NOTE — ED Triage Notes (Signed)
Patient arrived via EMS from dialysis. Complaints of weakness and fatigue after receiving Isera- esa. No new medications. Patient hypotensive upon EMS arrival with history of low blood pressure. Denies chest pain or shortness of breath.

## 2018-10-03 NOTE — ED Notes (Signed)
Pt states she is a dialysis pt and no longer produces urine.

## 2018-10-03 NOTE — ED Triage Notes (Signed)
Received 2 1/2 hours of dialysis

## 2018-10-03 NOTE — ED Notes (Signed)
IV team at bedside 

## 2018-10-03 NOTE — Progress Notes (Signed)
New Admission Note:   Arrival Method: stretcher via ED Mental Orientation: A&Ox4 Telemetry: Box 07, CCMD notified Assessment: refer to flowsheet Skin: Intact IV: Left hand,Saline locked Pain: denies Tubes: None Safety Measures: Safety Fall Prevention Plan has been discussed  Admission: to be completed 5 [place] Orientation: Patient has been orientated to the room, unit and staff.   Family: none at bedside  Orders to be reviewed and implemented. Will continue to monitor the patient. Call light has been placed within reach and bed alarm has been activated.

## 2018-10-03 NOTE — ED Notes (Signed)
Unable to get blood. Phlebotomy called

## 2018-10-03 NOTE — Plan of Care (Signed)
  Problem: Education: Goal: Knowledge of General Education information will improve Description: Including pain rating scale, medication(s)/side effects and non-pharmacologic comfort measures Outcome: Progressing   Problem: Health Behavior/Discharge Planning: Goal: Ability to manage health-related needs will improve 10/03/2018 2252 by Babs Sciara, RN Outcome: Progressing 10/03/2018 2252 by Babs Sciara, RN Outcome: Progressing   Problem: Clinical Measurements: Goal: Ability to maintain clinical measurements within normal limits will improve 10/03/2018 2252 by Babs Sciara, RN Outcome: Progressing 10/03/2018 2252 by Babs Sciara, RN Outcome: Progressing

## 2018-10-03 NOTE — ED Provider Notes (Signed)
[facility] EMERGENCY DEPARTMENT Provider Note   CSN: [id number] Arrival date & time: 10/03/18  1124     History   Chief Complaint Chief Complaint  Patient presents with  . Weakness    HPI [name] Marks is a 63 y.o. female.     HPI  Patient is a 63 year old female past medical history of ESRD on dialysis Monday Wednesday Friday, aortic valve prosthesis, history of endocarditis, TIA history, orthostatic hypotension and ongoing hypotension during dialysis presenting for generalized weakness during dialysis.  Patient was 2.5 hours into her session and had just received her ESA, Micera, when she began to feel lightheaded, nauseous, diaphoretic and presyncopal.  Treatment was stopped and patient was transferred to the emergency department.  Patient reports that on arrival, her slight headache had resolved, she no longer felt diaphoretic and felt overall back to baseline.  She reports that her blood pressure is frequently low.  She gives herself midodrine before and during dialysis sessions when she feels that her blood pressure is low.  She did take 2 doses of midodrine today.  Patient denies nausea, vomiting, diarrhea or other fluid losses.  She generally stays in the home and does not go out.  Additional history information obtained from [place], Therapist, sports at [facility].  She states that the patient had a blood pressure of 107/35 predialysis.  She then received 3 mcg of Mircera and a couple minutes later her blood pressure was 81/28.  It then responded to 98/57 the patient was still experiencing symptoms.  She had 700 mL of fluid removed.  They report that the patient frequently has soft to low blood pressures during dialysis.  Past Medical History:  Diagnosis Date  . Anemia   . Aortic valve prosthesis present 02/25/2016  . Arthritis    "knees" (01/03/2017)  . AVD (aortic valve disease) 07/12/2016  . Backache 12/06/2008  . Bacteremia due to coagulase-negative  Staphylococcus   . Carpal tunnel syndrome   . Cerebral embolism with transient ischemic attack (TIA)   . Cholelithiases 01/28/2017  . Chronic female pelvic pain 08/08/2012  . Complication of anesthesia    01/01/17- '"a long time ago, difficulty breathimg, not sure if it was due to anesthesia or not."  . CVA (cerebral vascular accident) ([place]) 05/16/2016  . End stage renal disease on dialysis ([place])    "MWF; [place]." (01/03/2017)  . Endocarditis   . Esophageal reflux 12/06/2008  . GERD (gastroesophageal reflux disease)   . Gout   . History of blood transfusion 2017   "related to blood poison" (01/03/2017)  . Increased endometrial stripe thickness 11/03/2015  . Neck pain   . Osteoporosis 09/2016   T score -2.6  . Pain and swelling of right upper extremity 12/20/2015  . Pulmonary HTN ([place]) 07/12/2016  . Renal dialysis device, implant, or graft complication 51/76/1607  . Renal insufficiency   . S/P cholecystectomy 02/14/2017  . Septic shock ([place])   . Staphylococcus aureus bacteremia   . TIA (transient ischemic attack)    "several" (01/03/2017)    Patient Active Problem List   Diagnosis Date Noted  . Orthostatic hypotension 03/14/2018  . S/P cholecystectomy 02/14/2017  . Osteoporosis 12/11/2016  . Cholelithiasis 11/07/2016  . Cerebral embolism with transient ischemic attack (TIA)   . AVD (aortic valve disease) 07/12/2016  . Pulmonary HTN ([place]) 07/12/2016  . Carpal tunnel syndrome 07/05/2016  . 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 CVA (cerebral vascular accident) ([place]) 05/16/2016  .  History of aortic valve replacement with bioprosthetic valve 05/08/2016  . Bacteremia due to coagulase-negative Staphylococcus   . History of endocarditis   . Anemia   . Complex endometrial hyperplasia with atypia 11/08/2015  . Obesity 10/28/2014  . Right carotid bruit 01/08/2013  . Generalized headaches 01/08/2013  . Chronic female pelvic pain 08/08/2012  . Gout, unspecified 12/06/2008  .  Esophageal reflux 12/06/2008  . Backache 12/06/2008  . FIBROIDS, UTERUS 11/24/2008  . ESRD (end stage renal disease) on dialysis ([place]) 07/01/2006    Past Surgical History:  Procedure Laterality Date  . AORTIC VALVE REPLACEMENT N/A 02/25/2016   Procedure: AORTIC VALVE REPLACEMENT (AVR) implanted with Magna Ease Aortic valve size 79mm;  Surgeon: Melrose Nakayama, MD;  Location: [facility];  Service: Open Heart Surgery;  Laterality: N/A;  . AV FISTULA PLACEMENT Left 01/03/2017   Procedure: INSERTION OF ARTERIOVENOUS (AV) GORE-TEX GRAFT LEFT THIGH;  Surgeon: Serafina Mitchell, MD;  Location: [facility];  Service: Vascular;  Laterality: Left;  . [place] REMOVAL Right 02/17/2016   Procedure: REMOVAL OF TWO ARTERIOVENOUS GORETEX GRAFTS ([place]);  Surgeon: Angelia Mould, MD;  Location: [facility];  Service: Vascular;  Laterality: Right;  . BREAST BIOPSY Left 2013   stereo   . BREAST BIOPSY Right 2011   stereo   . CARDIAC VALVE REPLACEMENT    . CHOLECYSTECTOMY N/A 01/30/2017   Procedure: LAPAROSCOPIC CHOLECYSTECTOMY;  Surgeon: Erroll Luna, MD;  Location: [facility];  Service: General;  Laterality: N/A;  . COLONOSCOPY W/ POLYPECTOMY    . DG AV DIALYSIS GRAFT DECLOT OR    . DILATATION & CURETTAGE/HYSTEROSCOPY WITH TRUECLEAR N/A 11/06/2012   Procedure: DILATATION & CURETTAGE/HYSTEROSCOPY WITH TRUECLEAR;  Surgeon: Terrance Mass, MD;  Location: [facility];  Service: Gynecology;  Laterality: N/A;  Truclear Resectoscopic Polypectomy   . INSERTION OF DIALYSIS CATHETER N/A 02/19/2016   Procedure: INSERTION OF Left Internal Jugular DIALYSIS CATHETER;  Surgeon: Angelia Mould, MD;  Location: [facility];  Service: Vascular;  Laterality: N/A;  . LOOP RECORDER INSERTION N/A 10/01/2016   Procedure: LOOP RECORDER INSERTION;  Surgeon: Constance Haw, MD;  Location: [facility];  Service: Cardiovascular;  Laterality: N/A;  . PATCH ANGIOPLASTY Right 02/17/2016   Procedure: PATCH ANGIOPLASTY;  Surgeon: Angelia Mould, MD;  Location: [facility];  Service: Vascular;  Laterality: Right;  . PERIPHERAL VASCULAR CATHETERIZATION N/A 09/14/2014   Procedure: A/V Shuntogram/Fistulagram;  Surgeon: Katha Cabal, MD;  Location: [facility];  Service: Cardiovascular;  Laterality: N/A;  . PERIPHERAL VASCULAR CATHETERIZATION N/A 09/14/2014   Procedure: A/V Shunt Intervention;  Surgeon: Katha Cabal, MD;  Location: [facility];  Service: Cardiovascular;  Laterality: N/A;  . PERIPHERAL VASCULAR CATHETERIZATION Right 12/09/2014   Procedure: A/V Shuntogram/Fistulagram;  Surgeon: Algernon Huxley, MD;  Location: [facility];  Service: Cardiovascular;  Laterality: Right;  . PERIPHERAL VASCULAR CATHETERIZATION N/A 12/09/2014   Procedure: A/V Shunt Intervention;  Surgeon: Algernon Huxley, MD;  Location: [facility];  Service: Cardiovascular;  Laterality: N/A;  . PERIPHERAL VASCULAR CATHETERIZATION Right 05/24/2015   Procedure: A/V Shuntogram;  Surgeon: Serafina Mitchell, MD;  Location: [facility];  Service: Cardiovascular;  Laterality: Right;  . PERIPHERAL VASCULAR CATHETERIZATION Right 05/24/2015   Procedure: Peripheral Vascular Balloon Angioplasty;  Surgeon: Serafina Mitchell, MD;  Location: [facility];  Service: Cardiovascular;  Laterality: Right;  right arm shunt  . PERIPHERAL VASCULAR CATHETERIZATION N/A 06/13/2015  Procedure: A/V Shuntogram/Fistulagram;  Surgeon: Algernon Huxley, MD;  Location: [facility];  Service: Cardiovascular;  Laterality: N/A;  . PERIPHERAL VASCULAR CATHETERIZATION N/A 06/13/2015   Procedure: A/V Shunt Intervention;  Surgeon: Algernon Huxley, MD;  Location: [facility];  Service: Cardiovascular;  Laterality: N/A;  . TEE WITHOUT CARDIOVERSION N/A 02/22/2016   Procedure: TRANSESOPHAGEAL ECHOCARDIOGRAM (TEE);  Surgeon: Pixie Casino, MD;  Location: [facility];  Service: Cardiovascular;  Laterality: N/A;  . TEE WITHOUT CARDIOVERSION N/A  02/25/2016   Procedure: TRANSESOPHAGEAL ECHOCARDIOGRAM (TEE);  Surgeon: Melrose Nakayama, MD;  Location: [facility];  Service: Open Heart Surgery;  Laterality: N/A;  . TEE WITHOUT CARDIOVERSION N/A 10/01/2016   Procedure: TRANSESOPHAGEAL ECHOCARDIOGRAM (TEE);  Surgeon: Pixie Casino, MD;  Location: [facility];  Service: Cardiovascular;  Laterality: N/A;  . TUBAL LIGATION  1983     OB History    Gravida  2   Para  2   Term      Preterm      AB      Living  2     SAB      TAB      Ectopic      Multiple      Live Births               Home Medications    Prior to Admission medications   Medication Sig Start Date End Date Taking? Authorizing Provider  albuterol (PROVENTIL HFA;VENTOLIN HFA) 108 (90 Base) MCG/ACT inhaler Inhale 2 puffs into the lungs as needed.    [provider]  cinacalcet (SENSIPAR) 60 MG tablet Take 60 mg by mouth at bedtime.     [provider]  clopidogrel (PLAVIX) 75 MG tablet Take 1 tablet (75 mg total) by mouth daily. Patient taking differently: Take 75 mg by mouth at bedtime.  01/23/17   Mikell, Jeani Sow, MD  famotidine (PEPCID) 20 MG tablet Take 20 mg by mouth 2 (two) times daily. Lunch and dinner    [provider]  megestrol (MEGACE) 40 MG tablet Take one tab BID x 14 days each month for 4 mos. Patient taking differently: Take 80 mg by mouth at bedtime. . 12/03/17   Princess Bruins, MD  midodrine (PROAMATINE) 10 MG tablet Take 1 tablet (10 mg total) by mouth 2 (two) times daily as needed (30 min prior to HD). 08/27/16   Mikell, Jeani Sow, MD  Multiple Vitamins-Minerals (PRORENAL QD PO) Take 1 tablet by mouth at bedtime.     [provider]  polyethylene glycol (MIRALAX / GLYCOLAX) packet Take 17 g by mouth daily as needed for mild constipation. 02/01/17   Caroline More, DO  sevelamer (RENVELA) 800 MG tablet Take 1,600 mg by mouth 3 (three) times daily with meals.     [provider]     Family History Family History  Problem Relation Age of Onset  . Diabetes Mother   . Hypertension Mother   . Heart disease Mother   . Alcohol abuse Mother   . Kidney disease Mother   . Cancer Father        COLON  . Alcohol abuse Father   . Breast cancer Sister 70  . Hypertension Sister   . Kidney disease Sister   . Hypertension Son   . Kidney disease Son     Social History Social History   Tobacco Use  . Smoking status: Never Smoker  . Smokeless tobacco: Never Used  Substance Use Topics  . Alcohol use: No  . Drug use: No     Allergies   Contrast media [iodinated diagnostic agents], Ancef [cefazolin], and Naproxen sodium   Review of Systems Review of Systems  Constitutional: Positive for diaphoresis. Negative for chills and fever.  HENT: Negative for congestion, rhinorrhea, sinus pain and sore throat.   Eyes: Negative for visual disturbance.  Respiratory: Negative for cough, chest tightness and shortness of breath.   Cardiovascular: Negative for chest pain, palpitations and leg swelling.  Gastrointestinal: Negative for abdominal pain, constipation, diarrhea, nausea and vomiting.  Genitourinary:       Pt does not make urine  Musculoskeletal: Negative for back pain and myalgias.  Skin: Negative for rash.  Neurological: Positive for light-headedness. Negative for dizziness, syncope and headaches.     Physical Exam Updated Vital Signs BP (!) 94/46   Pulse 68 Comment: EDP provider made aware  Temp 97.9 F (36.6 C) (Rectal)   Resp 16   Ht 5' (1.524 m)   Wt 74.8 kg   SpO2 99% Comment: EDP provider made aware  BMI 32.22 kg/m   Physical Exam Vitals signs and nursing note reviewed.  Constitutional:      General: She is not in acute distress.    Appearance: She is well-developed.     Comments: Well-appearing, normal mentation, good coloration and in no acute distress.  HENT:     Head: Normocephalic and atraumatic.  Eyes:     Conjunctiva/sclera: Conjunctivae  normal.     Pupils: Pupils are equal, round, and reactive to light.  Neck:     Musculoskeletal: Normal range of motion and neck supple.  Cardiovascular:     Rate and Rhythm: Normal rate and regular rhythm.     Pulses:          Posterior tibial pulses are 2+ on the right side and 2+ on the left side.     Heart sounds: S1 normal and S2 normal. No murmur.     Comments: No LE edema. No calf tenderness. Pulmonary:     Effort: Pulmonary effort is normal.     Breath sounds: Normal breath sounds. No wheezing or rales.  Abdominal:     General: There is no distension.     Palpations: Abdomen is soft.     Tenderness: There is no abdominal tenderness. There is no guarding.  Musculoskeletal: Normal range of motion.        General: No deformity.  Lymphadenopathy:     Cervical: No cervical adenopathy.  Skin:    General: Skin is warm and dry.     Findings: No erythema or rash.  Neurological:     Mental Status: She is alert.     Comments: Cranial nerves grossly intact. Patient moves extremities symmetrically and with good coordination.  Psychiatric:        Behavior: Behavior normal.        Thought Content: Thought content normal.        Judgment: Judgment normal.      ED Treatments / Results  Labs (all labs ordered are listed, but only abnormal results are displayed) Labs Reviewed  BASIC METABOLIC PANEL - Abnormal; Notable for the following components:      Result Value   Chloride 94 (*)    Creatinine, Ser 4.89 (*)    Calcium 8.5 (*)    GFR calc non Af Amer 9 (*)    GFR calc Af Amer 10 (*)    All other  components within normal limits  CBC - Abnormal; Notable for the following components:   RBC 3.59 (*)    Hemoglobin 10.4 (*)    HCT 33.0 (*)    RDW 15.9 (*)    All other components within normal limits  CBG MONITORING, ED  TROPONIN I (HIGH SENSITIVITY)  TROPONIN I (HIGH SENSITIVITY)    EKG EKG Interpretation  Date/Time:  Friday October 03 2018 11:45:07 EDT Ventricular Rate:   66 PR Interval:    QRS Duration: 84 QT Interval:  444 QTC Calculation: 466 R Axis:   72 Text Interpretation:  Sinus rhythm Abnormal R-wave progression, early transition since last tracing no significant change Confirmed by Malvin Johns [phone]) on 10/03/2018 11:59:07 AM   Radiology Dg Chest Portable 1 View  Result Date: 10/03/2018 CLINICAL DATA:  Near syncope. EXAM: PORTABLE CHEST 1 VIEW COMPARISON:  05/16/2016. FINDINGS: Prior cardiac valve replacement. Cardiac monitoring device noted. Heart size normal. No pulmonary venous congestion. No focal infiltrate. No pleural effusion or pneumothorax. Right subclavian stents again noted stable position. IMPRESSION: 1. Prior cardiac valve replacement. No cardiomegaly or pulmonary venous congestion. 2.  No acute pulmonary disease. Electronically Signed   By: Marcello Moores  Register   On: 10/03/2018 13:18    Procedures Procedures (including critical care time)  Medications Ordered in ED Medications  sodium chloride flush (NS) 0.9 % injection 3 mL (has no administration in time range)  sodium chloride 0.9 % bolus 250 mL (0 mLs Intravenous Stopped 10/03/18 1418)  sodium chloride 0.9 % bolus 250 mL (250 mLs Intravenous New Bag/Given 10/03/18 1417)  sodium chloride 0.9 % bolus 250 mL (250 mLs Intravenous New Bag/Given 10/03/18 1632)     Initial Impression / Assessment and Plan / ED Course  I have reviewed the triage vital signs and the nursing notes.  Pertinent labs & imaging results that were available during my care of the patient were reviewed by me and considered in my medical decision making (see chart for details).  Clinical Course as of Oct 02 1840  Fri Oct 03, 2018  1247 Spoke with [place].    [AM]  [phone] Patient mentating well and reports she feels much better.  Troponin is negative.  Blood pressure is still soft.  Will give additional small aliquot of fluid.  Will consult nephrology.   [AM]  1445 Spoke with Dr. Jonnie Finner  of nephrology who recommends continuing to give aliquots of 250 mL up to 1L until pressure stable/pt not symptomatic. Appreciate his involvement.    [AM]  1600 Pressure improving to MAP >65.  BP(!): 102/49 [AM]  1856 Will give third aliquot of 250 mL.   BP(!): 92/47 [AM]  1638 Reassessed. Asymptomatic. MAP 57 but systolic 97. Pt receiving 3d    [AM]  1807 Manual BP. After 750 mL fluid.  BP(!): 82/48 [AM]  1841 Spoke with Dr. Maudie Mercury of [facility] who will admit pt. Recommends additional midodrine does. Appreciate her involvement.    [AM]    Clinical Course User Index [AM] Tamala Julian       This is a 63 year old female with past medical history of ESRD on dialysis Monday Wednesday Friday, incomplete session today due to low blood pressures, aortic valve prosthesis, history of endocarditis, TIA history, orthostatic hypotension and ongoing hypotension during dialysis presenting for generalized weakness during dialysis.  Found to be persistently hypotensive to soft BPs here.  She is remained asymptomatic without evidence of poor perfusion.  Differential diagnosis includes  rapid fluid shifts, ACS, infectious etiology.  Work-up demonstrating stable normocytic anemia at 10.4.  BMP with unremarkable electrolytes.  CBG normal.  Delta troponin is negative.  EKG in normal sinus rhythm without evidence of acute ischemia, infarction, or arrhythmia reviewed by me.  Portable chest x-ray, reviewed by me without evidence of acute cardiopulmonary disease.  Cardiac and aortic contours within normal limits.  Despite patient being given total of 750 mL of fluid over several hours in aliquots of 250 mL, patient remained persistently hypotensive.  She is asymptomatic and continuing to perfuse well.  She reports this is at her baseline, but with the low maps and blood pressures in the 80s, would recommend admission at this time.  Patient is amenable to plan.  Will admit to family practice.   This is a  supervised visit with Dr. Malvin Johns. Evaluation, management, and disposition planning discussed with this attending physician.  Final Clinical Impressions(s) / ED Diagnoses   Final diagnoses:  Hypotension, unspecified hypotension type    ED Discharge Orders    None       Albesa Seen, PA-C 10/03/18 1842    Malvin Johns, MD 10/03/18 1845

## 2018-10-03 NOTE — H&P (Addendum)
[facility] Admission History and Physical Service Pager: [phone]  Patient name: [name] Marks Medical record number: [id number] Date of birth: [birth year] Age: 63 y.o. Gender: female  Primary Care Provider: Patriciaann Clan, DO Consultants: IP CONSULT TO NEPHROLOGY IP CONSULT TO FAMILY PRACTICE Code Status: Full Preferred Emergency Contact :   Contact Information    Name Relation Home Work Mobile   [place] Son [phone]  [phone]   Ernie Hew   [phone]      Chief Complaint: Hypotension during dialysis    Assessment and Plan: [name] Marks is a 63 y.o. female who presented with hypotension after dialysis and was admitted for observation.  [place] past medical history is significant for GERD, ESRD (dialysis Monday Wednesday Friday), aortic valve replacement in 2017 (bioprosthetic), CVA in 2018, carpal tunnel, pulmonary hypertension, osteoporosis   #Hypotension Patient presents after hypotension to 81/28 at dialysis.  She went hypotensive after receiving ESA, which she receives at every single dialysis session and has never had a reaction in the past.  She reports that she did not take any other medications beyond her usual, which is midodrine before and during dialysis and 2 tablets of tramadol prior to dialysis for back pain.  She has been afebrile, her review of systems is negative, though she does report being lightheaded and having a headache after being hypotensive.  In the ED, patient's blood pressures continue to be in the 80s to 90s.  They are slowly improving, however, would like to admit patient for observation overnight as patient reports this is never previously happened to her.  Patient received third dose of midodrine in the ED. Differential for hypotension includes vasodilatory which is less likely as patient does not have any fever or white blood cell count, or allergic reaction.  Patient is not having any  shortness of breath or chest pain making an obstructive process like pulmonary embolism or cardiac tamponade less likely.  Additionally patient denies any palpitations and has a pacemaker in place making arrhythmia less likely.  Troponin and EKG negative and chest x-ray with no abnormalities.  Patient denies taking other additional medications today and did not receive any other medications at dialysis.  Patient denies any vomiting, diarrhea or other sources of extrarenal losses.  Her hemoglobin is stable.  At dialysis, she was dialyzed 700 which was a placed with 750 cc of fluid in the ED.  As patient's blood pressures continue to stabilize and improve, we will continue to watch patient overnight.  Should she have further episodes of hypotension, will start further cardiogenic work-up for valvulopathy.  At discharge, will encourage patient to schedule appointment with cardiology to interrogate pacemaker and follow-up. . Admit to FMTS, attending Dr. Andria Frames. Level of care: telemetry . Continuous cardiac monitoring - monitor Bps overnight.  . Vitals per unit routine, Activity OOB w/ assistance,  . No consults at this time   . No labs for the morning . Discharge in AM if BP >02 systolic . If decompensates, will plan to consult CCM as patient already resuscitated   ESRD Dialysis MWF at [facility].  Patient is compliant with dialysis.  Patient is currently increasing her dry weight due to hypotension recently.  Patient follows with [facility].  Will not consults nephrology at this time unless patient requires further inpatient stay.  Renal labs stable.  Repeat dose of midodrine  Bioprothstetic AV Valve  Patient is not on any anticoagulation.   Continue plavix   #FEN/GI:  .  Fluids: KVO . Heart healthy diet  Access: Left IV, AV graft in left upper arm VTE prophylaxis: Heparin 5000 Q8H   Disposition: Admit to telemetry, likely discharge 8/8 morning.   ============================================================================= HPI [name] Marks is a 63 y.o. female with past medical history significant for ESRD on dialysis Monday Wednesday Friday, who presents with hypotension to 81/28 at dialysis today.  Patient reports that her blood pressures are typically in the 74Q to 595 systolic.  She has noticed that her blood pressures have been decreasing recently and is working with her nephrologist to increase her dry weight.  She reports no significant changes in today schedule.  Prior to dialysis, she took 2 tramadol and 1 Midrin as well as an additional Midrin during dialysis.  Patient reports that hypotension occurred after receiving ESA, however, patient has this weekly and has never had this reaction to this medication before.  She denies any previous significant drops in her blood pressure, though she does note that she gets dizzy when standing up at home very often.  Patient is otherwise afebrile and review of systems as below..   In the ED, patient received a total of 750 cc of fluid after consulting nephrology.  Her labs were stable from previous results.  She will be admitted for observation of hypotension overnight and likely discharge in the morning. Abnormal Labs Reviewed  BASIC METABOLIC PANEL - Abnormal; Notable for the following components:      Result Value   Chloride 94 (*)    Creatinine, Ser 4.89 (*)    Calcium 8.5 (*)    GFR calc non Af Amer 9 (*)    GFR calc Af Amer 10 (*)    All other components within normal limits  CBC - Abnormal; Notable for the following components:   RBC 3.59 (*)    Hemoglobin 10.4 (*)    HCT 33.0 (*)    RDW 15.9 (*)    All other components within normal limits    Review Of Systems: Review of Systems  Constitutional: Negative for chills, fever and weight loss.  HENT: Negative for congestion, hearing loss and sore throat.   Eyes: Negative for blurred vision, double vision and pain.   Respiratory: Negative for cough and shortness of breath.   Cardiovascular: Negative for chest pain, palpitations and leg swelling.  Gastrointestinal: Negative for abdominal pain, constipation, diarrhea, heartburn, nausea and vomiting.  Musculoskeletal: Negative for falls, joint pain and myalgias.  Skin: Negative for itching and rash.  Neurological: Positive for dizziness and headaches. Negative for tremors, sensory change, focal weakness, loss of consciousness and weakness.  Psychiatric/Behavioral: Negative for depression.   Patient Active Problem List   Diagnosis Date Noted  . Orthostatic hypotension 03/14/2018  . S/P cholecystectomy 02/14/2017  . Osteoporosis 12/11/2016  . Cholelithiasis 11/07/2016  . Cerebral embolism with transient ischemic attack (TIA)   . AVD (aortic valve disease) 07/12/2016  . Pulmonary HTN ([place]) 07/12/2016  . Carpal tunnel syndrome 07/05/2016  . 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 CVA (cerebral vascular accident) ([place]) 05/16/2016  . History of aortic valve replacement with bioprosthetic valve 05/08/2016  . Bacteremia due to coagulase-negative Staphylococcus   . History of endocarditis   . Anemia   . Complex endometrial hyperplasia with atypia 11/08/2015  . Obesity 10/28/2014  . Right carotid bruit 01/08/2013  . Generalized headaches 01/08/2013  . Chronic female pelvic pain 08/08/2012  . Gout, unspecified 12/06/2008  . Esophageal reflux 12/06/2008  . Backache 12/06/2008  .  FIBROIDS, UTERUS 11/24/2008  . ESRD (end stage renal disease) on dialysis ([place]) 07/01/2006   Past Medical History: Past Medical History:  Diagnosis Date  . Anemia   . Aortic valve prosthesis present 02/25/2016  . Arthritis    "knees" (01/03/2017)  . AVD (aortic valve disease) 07/12/2016  . Backache 12/06/2008  . Bacteremia due to coagulase-negative Staphylococcus   . Carpal tunnel syndrome   . Cerebral embolism with transient ischemic attack (TIA)   . Cholelithiases  01/28/2017  . Chronic female pelvic pain 08/08/2012  . Complication of anesthesia    01/01/17- '"a long time ago, difficulty breathimg, not sure if it was due to anesthesia or not."  . CVA (cerebral vascular accident) ([place]) 05/16/2016  . End stage renal disease on dialysis ([place])    "MWF; [place]." (01/03/2017)  . Endocarditis   . Esophageal reflux 12/06/2008  . GERD (gastroesophageal reflux disease)   . Gout   . History of blood transfusion 2017   "related to blood poison" (01/03/2017)  . Increased endometrial stripe thickness 11/03/2015  . Neck pain   . Osteoporosis 09/2016   T score -2.6  . Pain and swelling of right upper extremity 12/20/2015  . Pulmonary HTN ([place]) 07/12/2016  . Renal dialysis device, implant, or graft complication 95/10/3265  . Renal insufficiency   . S/P cholecystectomy 02/14/2017  . Septic shock ([place])   . Staphylococcus aureus bacteremia   . TIA (transient ischemic attack)    "several" (01/03/2017)    Past Surgical History: Past Surgical History:  Procedure Laterality Date  . AORTIC VALVE REPLACEMENT N/A 02/25/2016   Procedure: AORTIC VALVE REPLACEMENT (AVR) implanted with Magna Ease Aortic valve size 42mm;  Surgeon: Melrose Nakayama, MD;  Location: [place];  Service: Open Heart Surgery;  Laterality: N/A;  . AV FISTULA PLACEMENT Left 01/03/2017   Procedure: INSERTION OF ARTERIOVENOUS (AV) GORE-TEX GRAFT LEFT THIGH;  Surgeon: Serafina Mitchell, MD;  Location: [place];  Service: Vascular;  Laterality: Left;  . [place] REMOVAL Right 02/17/2016   Procedure: REMOVAL OF TWO ARTERIOVENOUS GORETEX GRAFTS ([place]);  Surgeon: Angelia Mould, MD;  Location: [place];  Service: Vascular;  Laterality: Right;  . BREAST BIOPSY Left 2013   stereo   . BREAST BIOPSY Right 2011   stereo   . CARDIAC VALVE REPLACEMENT    . CHOLECYSTECTOMY N/A 01/30/2017   Procedure: LAPAROSCOPIC CHOLECYSTECTOMY;  Surgeon: Erroll Luna, MD;  Location: [place];  Service: General;  Laterality: N/A;   . COLONOSCOPY W/ POLYPECTOMY    . DG AV DIALYSIS GRAFT DECLOT OR    . DILATATION & CURETTAGE/HYSTEROSCOPY WITH TRUECLEAR N/A 11/06/2012   Procedure: DILATATION & CURETTAGE/HYSTEROSCOPY WITH TRUECLEAR;  Surgeon: Terrance Mass, MD;  Location: [place] [facility] [facility];  Service: Gynecology;  Laterality: N/A;  Truclear Resectoscopic Polypectomy   . INSERTION OF DIALYSIS CATHETER N/A 02/19/2016   Procedure: INSERTION OF Left Internal Jugular DIALYSIS CATHETER;  Surgeon: Angelia Mould, MD;  Location: [place];  Service: Vascular;  Laterality: N/A;  . LOOP RECORDER INSERTION N/A 10/01/2016   Procedure: LOOP RECORDER INSERTION;  Surgeon: Constance Haw, MD;  Location: [place] [facility];  Service: Cardiovascular;  Laterality: N/A;  . PATCH ANGIOPLASTY Right 02/17/2016   Procedure: PATCH ANGIOPLASTY;  Surgeon: Angelia Mould, MD;  Location: [place];  Service: Vascular;  Laterality: Right;  . PERIPHERAL VASCULAR CATHETERIZATION N/A 09/14/2014   Procedure: A/V Shuntogram/Fistulagram;  Surgeon: Katha Cabal, MD;  Location: [place] [facility];  Service: Cardiovascular;  Laterality: N/A;  . PERIPHERAL VASCULAR CATHETERIZATION N/A 09/14/2014   Procedure: A/V Shunt Intervention;  Surgeon: Katha Cabal, MD;  Location: [place] [facility];  Service: Cardiovascular;  Laterality: N/A;  . PERIPHERAL VASCULAR CATHETERIZATION Right 12/09/2014   Procedure: A/V Shuntogram/Fistulagram;  Surgeon: Algernon Huxley, MD;  Location: [place] [facility];  Service: Cardiovascular;  Laterality: Right;  . PERIPHERAL VASCULAR CATHETERIZATION N/A 12/09/2014   Procedure: A/V Shunt Intervention;  Surgeon: Algernon Huxley, MD;  Location: [place] [facility];  Service: Cardiovascular;  Laterality: N/A;  . PERIPHERAL VASCULAR CATHETERIZATION Right 05/24/2015   Procedure: A/V Shuntogram;  Surgeon: Serafina Mitchell, MD;  Location: [place] [facility];  Service: Cardiovascular;  Laterality: Right;  . PERIPHERAL VASCULAR CATHETERIZATION  Right 05/24/2015   Procedure: Peripheral Vascular Balloon Angioplasty;  Surgeon: Serafina Mitchell, MD;  Location: [place] [facility];  Service: Cardiovascular;  Laterality: Right;  right arm shunt  . PERIPHERAL VASCULAR CATHETERIZATION N/A 06/13/2015   Procedure: A/V Shuntogram/Fistulagram;  Surgeon: Algernon Huxley, MD;  Location: [place] [facility];  Service: Cardiovascular;  Laterality: N/A;  . PERIPHERAL VASCULAR CATHETERIZATION N/A 06/13/2015   Procedure: A/V Shunt Intervention;  Surgeon: Algernon Huxley, MD;  Location: [place] [facility];  Service: Cardiovascular;  Laterality: N/A;  . TEE WITHOUT CARDIOVERSION N/A 02/22/2016   Procedure: TRANSESOPHAGEAL ECHOCARDIOGRAM (TEE);  Surgeon: Pixie Casino, MD;  Location: [facility];  Service: Cardiovascular;  Laterality: N/A;  . TEE WITHOUT CARDIOVERSION N/A 02/25/2016   Procedure: TRANSESOPHAGEAL ECHOCARDIOGRAM (TEE);  Surgeon: Melrose Nakayama, MD;  Location: [place];  Service: Open Heart Surgery;  Laterality: N/A;  . TEE WITHOUT CARDIOVERSION N/A 10/01/2016   Procedure: TRANSESOPHAGEAL ECHOCARDIOGRAM (TEE);  Surgeon: Pixie Casino, MD;  Location: [facility];  Service: Cardiovascular;  Laterality: N/A;  . TUBAL LIGATION  1983    Family History: family history includes Alcohol abuse in her father and mother; Breast cancer (age of onset: 56) in her sister; Cancer in her father; Diabetes in her mother; Heart disease in her mother; Hypertension in her mother, sister, and son; Kidney disease in her mother, sister, and son.   Social History: [place] reports that she has never smoked. She has never used smokeless tobacco. She reports that she does not drink alcohol or use drugs.  Allergies and Medications: Allergies  Allergen Reactions  . Contrast Media [Iodinated Diagnostic Agents] Anaphylaxis  . Ancef [Cefazolin] Other (See Comments)    Pt states she can not tolerate  . Naproxen Sodium Itching   No outpatient medications have been marked as  taking for the 10/03/18 encounter [facility] Encounter).    Objective: BP (!) 99/46   Pulse 68 Comment: EDP provider made aware  Temp 97.9 F (36.6 C) (Rectal)   Resp 18   Ht 5' (1.524 m)   Wt 74.8 kg   SpO2 99% Comment: EDP provider made aware  BMI 32.22 kg/m  Filed Weights   10/03/18 1131  Weight: 74.8 kg   Patient Vitals for the past 24 hrs:  BP Temp Temp src Pulse Resp SpO2 Height Weight  10/03/18 1830 (!) 99/46 - - - 18 - - -  10/03/18 1815 (!) 100/52 - - - 15 - - -  10/03/18 1755 (!) 82/48 - - - - - - -  10/03/18 1730 (!) 90/49 - - - 16 - - -  10/03/18 1715 (!) 94/46 - - - 16 - - -  10/03/18 1700 (!) 101/47 - - -  19 - - -  10/03/18 1645 (!) 89/43 - - - 14 - - -  10/03/18 1630 (!) 83/55 - - - 16 - - -  10/03/18 1530 (!) 92/47 - - - 12 - - -  10/03/18 1503 - 97.9 F (36.6 C) Rectal - - - - -  10/03/18 1445 (!) 102/49 - - - 17 - - -  10/03/18 1415 (!) 89/49 - - - 17 - - -  10/03/18 1406 (!) 78/53 - - 68 (!) 23 99 % - -  10/03/18 1215 (!) 97/35 - - - 14 - - -  10/03/18 1135 - - - 67 - 100 % - -  10/03/18 1131 - - - - - - 5' (1.524 m) 74.8 kg  10/03/18 1130 (!) 101/48 97.8 F (36.6 C) Oral 82 16 100 % - -   Exam: Physical Exam Constitutional:      General: She is not in acute distress.    Appearance: Normal appearance.  HENT:     Head: Normocephalic and atraumatic.     Nose: Nose normal.  Eyes:     General: No scleral icterus. Neck:     Musculoskeletal: Normal range of motion and neck supple.  Cardiovascular:     Rate and Rhythm: Normal rate and regular rhythm.     Heart sounds: Murmur present.     Comments: 2/6 blowing holosystolic murmur Pulmonary:     Effort: Pulmonary effort is normal.     Breath sounds: Normal breath sounds.  Abdominal:     General: Bowel sounds are normal.     Palpations: Abdomen is soft.  Musculoskeletal:     Right lower leg: No edema.     Left lower leg: No edema.  Skin:    General: Skin is warm and dry.     Capillary Refill:  Capillary refill takes less than 2 seconds.  Neurological:     General: No focal deficit present.     Mental Status: She is alert.  Psychiatric:        Behavior: Behavior normal.        Thought Content: Thought content normal.      Labs and Imaging: I have personally reviewed following labs and imaging studies CBC: Recent Labs  Lab 10/03/18 1134  WBC 9.1  HGB 10.4*  HCT 33.0*  MCV 91.9  PLT 260   CMP: Recent Labs  Lab 10/03/18 1134  NA 138  K 4.7  CL 94*  CO2 29  GLUCOSE 99  BUN 15  CREATININE 4.89*  CALCIUM 8.5*   GFR: Estimated Creatinine Clearance: 10.5 mL/min (A) (by C-G formula based on SCr of 4.89 mg/dL (H)).  CBG: Recent Labs  Lab 10/03/18 1233  GLUCAP 90   Imaging/Diagnostic Tests: Dg Chest Portable 1 View  Result Date: 10/03/2018 CLINICAL DATA:  Near syncope. EXAM: PORTABLE CHEST 1 VIEW COMPARISON:  05/16/2016. FINDINGS: Prior cardiac valve replacement. Cardiac monitoring device noted. Heart size normal. No pulmonary venous congestion. No focal infiltrate. No pleural effusion or pneumothorax. Right subclavian stents again noted stable position. IMPRESSION: 1. Prior cardiac valve replacement. No cardiomegaly or pulmonary venous congestion. 2.  No acute pulmonary disease. Electronically Signed   By: Marcello Moores  Register   On: 10/03/2018 13:18    EKG Interpretation  Date/Time:  Friday October 03 2018 11:45:07 EDT Ventricular Rate:  66 PR Interval:    QRS Duration: 84 QT Interval:  444 QTC Calculation: 466 R Axis:   72 Text Interpretation:  Sinus  rhythm Abnormal R-wave progression, early transition since last tracing no significant change Confirmed by Malvin Johns [phone]) on 10/03/2018 11:59:07 AM          Wilber Oliphant, M.D. 10/03/2018, 6:46 PM PGY-2, [place] Intern pager: [phone], text pages welcome

## 2018-10-03 NOTE — ED Notes (Signed)
ED TO INPATIENT HANDOFF REPORT  ED Nurse Name and Phone #:  Gretta Cool [phone]  S Name/Age/Gender [name] Marks 63 y.o. female Room/Bed: 040C/040C  Code Status   Code Status: Prior  Home/SNF/Other Home Patient oriented to: self, place, time and situation Is this baseline? Yes   Triage Complete: Triage complete  Chief Complaint WEAKNESS  Triage Note Patient arrived via EMS from dialysis. Complaints of weakness and fatigue after receiving Isera- esa. No new medications. Patient hypotensive upon EMS arrival with history of low blood pressure. Denies chest pain or shortness of breath.  Received 2 1/2 hours of dialysis   Allergies Allergies  Allergen Reactions  . Contrast Media [Iodinated Diagnostic Agents] Anaphylaxis  . Ancef [Cefazolin] Other (See Comments)    Pt states she can not tolerate  . Naproxen Sodium Itching    Level of Care/Admitting Diagnosis ED Disposition    ED Disposition Condition [facility] Area: [place] [[id number]]  Level of Care: Telemetry Medical [104]  Covid Evaluation: Confirmed COVID Negative  Diagnosis: Hypotension [017494]  Admitting Physician: Wilber Oliphant [[id number]]  Attending Physician: Madison Hickman A [[id number]]  PT Class (Do Not Modify): Observation [104]  PT Acc Code (Do Not Modify): Observation [10022]       B Medical/Surgery History Past Medical History:  Diagnosis Date  . Anemia   . Aortic valve prosthesis present 02/25/2016  . Arthritis    "knees" (01/03/2017)  . AVD (aortic valve disease) 07/12/2016  . Backache 12/06/2008  . Bacteremia due to coagulase-negative Staphylococcus   . Carpal tunnel syndrome   . Cerebral embolism with transient ischemic attack (TIA)   . Cholelithiases 01/28/2017  . Chronic female pelvic pain 08/08/2012  . Complication of anesthesia    01/01/17- '"a long time ago, difficulty breathimg, not sure if it was due to anesthesia or not."  . CVA (cerebral vascular accident)  ([place]) 05/16/2016  . End stage renal disease on dialysis ([place])    "MWF; [place]." (01/03/2017)  . Endocarditis   . Esophageal reflux 12/06/2008  . GERD (gastroesophageal reflux disease)   . Gout   . History of blood transfusion 2017   "related to blood poison" (01/03/2017)  . Increased endometrial stripe thickness 11/03/2015  . Neck pain   . Osteoporosis 09/2016   T score -2.6  . Pain and swelling of right upper extremity 12/20/2015  . Pulmonary HTN ([place]) 07/12/2016  . Renal dialysis device, implant, or graft complication 63/84/6659  . Renal insufficiency   . S/P cholecystectomy 02/14/2017  . Septic shock ([place])   . Staphylococcus aureus bacteremia   . TIA (transient ischemic attack)    "several" (01/03/2017)   Past Surgical History:  Procedure Laterality Date  . AORTIC VALVE REPLACEMENT N/A 02/25/2016   Procedure: AORTIC VALVE REPLACEMENT (AVR) implanted with Magna Ease Aortic valve size 35mm;  Surgeon: Melrose Nakayama, MD;  Location: [place];  Service: Open Heart Surgery;  Laterality: N/A;  . AV FISTULA PLACEMENT Left 01/03/2017   Procedure: INSERTION OF ARTERIOVENOUS (AV) GORE-TEX GRAFT LEFT THIGH;  Surgeon: Serafina Mitchell, MD;  Location: [place];  Service: Vascular;  Laterality: Left;  . [place] REMOVAL Right 02/17/2016   Procedure: REMOVAL OF TWO ARTERIOVENOUS GORETEX GRAFTS ([place]);  Surgeon: Angelia Mould, MD;  Location: [place];  Service: Vascular;  Laterality: Right;  . BREAST BIOPSY Left 2013   stereo   . BREAST BIOPSY Right 2011   stereo   . CARDIAC VALVE REPLACEMENT    .  CHOLECYSTECTOMY N/A 01/30/2017   Procedure: LAPAROSCOPIC CHOLECYSTECTOMY;  Surgeon: Erroll Luna, MD;  Location: [place];  Service: General;  Laterality: N/A;  . COLONOSCOPY W/ POLYPECTOMY    . DG AV DIALYSIS GRAFT DECLOT OR    . DILATATION & CURETTAGE/HYSTEROSCOPY WITH TRUECLEAR N/A 11/06/2012   Procedure: DILATATION & CURETTAGE/HYSTEROSCOPY WITH TRUECLEAR;  Surgeon: Terrance Mass, MD;   Location: [facility];  Service: Gynecology;  Laterality: N/A;  Truclear Resectoscopic Polypectomy   . INSERTION OF DIALYSIS CATHETER N/A 02/19/2016   Procedure: INSERTION OF Left Internal Jugular DIALYSIS CATHETER;  Surgeon: Angelia Mould, MD;  Location: [place];  Service: Vascular;  Laterality: N/A;  . LOOP RECORDER INSERTION N/A 10/01/2016   Procedure: LOOP RECORDER INSERTION;  Surgeon: Constance Haw, MD;  Location: [facility];  Service: Cardiovascular;  Laterality: N/A;  . PATCH ANGIOPLASTY Right 02/17/2016   Procedure: PATCH ANGIOPLASTY;  Surgeon: Angelia Mould, MD;  Location: [place];  Service: Vascular;  Laterality: Right;  . PERIPHERAL VASCULAR CATHETERIZATION N/A 09/14/2014   Procedure: A/V Shuntogram/Fistulagram;  Surgeon: Katha Cabal, MD;  Location: [place] [facility];  Service: Cardiovascular;  Laterality: N/A;  . PERIPHERAL VASCULAR CATHETERIZATION N/A 09/14/2014   Procedure: A/V Shunt Intervention;  Surgeon: Katha Cabal, MD;  Location: [place] [facility];  Service: Cardiovascular;  Laterality: N/A;  . PERIPHERAL VASCULAR CATHETERIZATION Right 12/09/2014   Procedure: A/V Shuntogram/Fistulagram;  Surgeon: Algernon Huxley, MD;  Location: [place] [facility];  Service: Cardiovascular;  Laterality: Right;  . PERIPHERAL VASCULAR CATHETERIZATION N/A 12/09/2014   Procedure: A/V Shunt Intervention;  Surgeon: Algernon Huxley, MD;  Location: [place] [facility];  Service: Cardiovascular;  Laterality: N/A;  . PERIPHERAL VASCULAR CATHETERIZATION Right 05/24/2015   Procedure: A/V Shuntogram;  Surgeon: Serafina Mitchell, MD;  Location: [place];  Service: Cardiovascular;  Laterality: Right;  . PERIPHERAL VASCULAR CATHETERIZATION Right 05/24/2015   Procedure: Peripheral Vascular Balloon Angioplasty;  Surgeon: Serafina Mitchell, MD;  Location: [place] [facility];  Service: Cardiovascular;  Laterality: Right;  right arm shunt  . PERIPHERAL VASCULAR CATHETERIZATION N/A  06/13/2015   Procedure: A/V Shuntogram/Fistulagram;  Surgeon: Algernon Huxley, MD;  Location: [place] [facility];  Service: Cardiovascular;  Laterality: N/A;  . PERIPHERAL VASCULAR CATHETERIZATION N/A 06/13/2015   Procedure: A/V Shunt Intervention;  Surgeon: Algernon Huxley, MD;  Location: [place] [facility];  Service: Cardiovascular;  Laterality: N/A;  . TEE WITHOUT CARDIOVERSION N/A 02/22/2016   Procedure: TRANSESOPHAGEAL ECHOCARDIOGRAM (TEE);  Surgeon: Pixie Casino, MD;  Location: [facility];  Service: Cardiovascular;  Laterality: N/A;  . TEE WITHOUT CARDIOVERSION N/A 02/25/2016   Procedure: TRANSESOPHAGEAL ECHOCARDIOGRAM (TEE);  Surgeon: Melrose Nakayama, MD;  Location: [place];  Service: Open Heart Surgery;  Laterality: N/A;  . TEE WITHOUT CARDIOVERSION N/A 10/01/2016   Procedure: TRANSESOPHAGEAL ECHOCARDIOGRAM (TEE);  Surgeon: Pixie Casino, MD;  Location: [facility];  Service: Cardiovascular;  Laterality: N/A;  . TUBAL LIGATION  1983     A IV Location/Drains/Wounds Patient Lines/Drains/Airways Status   Active Line/Drains/Airways    Name:   Placement date:   Placement time:   Site:   Days:   Peripheral IV 10/03/18 Left Hand   10/03/18    1203    Hand   less than 1   Fistula / Graft Left Upper arm Arteriovenous vein graft   -    -    Upper arm      Fistula /  Graft Left Thigh Arteriovenous vein graft   01/03/17    0957    Thigh   638   Incision - 4 Ports Abdomen 1: Umbilicus 2: Mid;Upper 3: Right;Upper 4: Right;Lower   01/30/17    1059     611          Intake/Output Last 24 hours  Intake/Output Summary (Last 24 hours) at 10/03/2018 2035 Last data filed at 10/03/2018 1946 Gross per 24 hour  Intake 750 ml  Output -  Net 750 ml    Labs/Imaging Results for orders placed or performed during the hospital encounter of 10/03/18 (from the past 48 hour(s))  Basic metabolic panel     Status: Abnormal   Collection Time: 10/03/18 11:34 AM  Result Value Ref Range   Sodium 138 135 -  145 mmol/L   Potassium 4.7 3.5 - 5.1 mmol/L   Chloride 94 (L) 98 - 111 mmol/L   CO2 29 22 - 32 mmol/L   Glucose, Bld 99 70 - 99 mg/dL   BUN 15 8 - 23 mg/dL   Creatinine, Ser 4.89 (H) 0.44 - 1.00 mg/dL   Calcium 8.5 (L) 8.9 - 10.3 mg/dL   GFR calc non Af Amer 9 (L) >60 mL/min   GFR calc Af Amer 10 (L) >60 mL/min   Anion gap 15 5 - 15    Comment: Performed at [facility], [address] [place], [place] [address]  CBC     Status: Abnormal   Collection Time: 10/03/18 11:34 AM  Result Value Ref Range   WBC 9.1 4.0 - 10.5 K/uL   RBC 3.59 (L) 3.87 - 5.11 MIL/uL   Hemoglobin 10.4 (L) 12.0 - 15.0 g/dL   HCT 33.0 (L) 36.0 - 46.0 %   MCV 91.9 80.0 - 100.0 fL   MCH 29.0 26.0 - 34.0 pg   MCHC 31.5 30.0 - 36.0 g/dL   RDW 15.9 (H) 11.5 - 15.5 %   Platelets 260 150 - 400 K/uL   nRBC 0.0 0.0 - 0.2 %    Comment: Performed at [facility], [place] [address] [place], [place] [address]  CBG monitoring, ED     Status: None   Collection Time: 10/03/18 12:33 PM  Result Value Ref Range   Glucose-Capillary 90 70 - 99 mg/dL  Troponin I (High Sensitivity)     Status: None   Collection Time: 10/03/18  1:07 PM  Result Value Ref Range   Troponin I (High Sensitivity) 14 <18 ng/L    Comment: (NOTE) Elevated high sensitivity troponin I (hsTnI) values and significant  changes across serial measurements may suggest ACS but many other  chronic and acute conditions are known to elevate hsTnI results.  Refer to the "Links" section for chest pain algorithms and additional  guidance. Performed at [facility], [place] [address], [place], [place] [address]   Troponin I (High Sensitivity)     Status: None   Collection Time: 10/03/18  3:19 PM  Result Value Ref Range   Troponin I (High Sensitivity) 7 <18 ng/L    Comment: (NOTE) Elevated high sensitivity troponin I (hsTnI) values and significant  changes across serial measurements may suggest ACS but many other  chronic and acute conditions are  known to elevate hsTnI results.  Refer to the "Links" section for chest pain algorithms and additional  guidance. Performed at [facility], [place] [address], [place], [place] [address]    Dg Chest Portable  1 View  Result Date: 10/03/2018 CLINICAL DATA:  Near syncope. EXAM: PORTABLE CHEST 1 VIEW COMPARISON:  05/16/2016. FINDINGS: Prior cardiac valve replacement. Cardiac monitoring device noted. Heart size normal. No pulmonary venous congestion. No focal infiltrate. No pleural effusion or pneumothorax. Right subclavian stents again noted stable position. IMPRESSION: 1. Prior cardiac valve replacement. No cardiomegaly or pulmonary venous congestion. 2.  No acute pulmonary disease. Electronically Signed   By: Marcello Moores  Register   On: 10/03/2018 13:18    Pending Labs Unresulted Labs (From admission, onward)    Start     Ordered   10/03/18 1852  SARS CORONAVIRUS 2 Nasal Swab Aptima Multi Swab  (Asymptomatic/Tier 2 Patients Labs)  Once,   STAT    Question Answer Comment  Is this test for diagnosis or screening Screening   Symptomatic for COVID-19 as defined by CDC No   Hospitalized for COVID-19 No   Admitted to ICU for COVID-19 No   Previously tested for COVID-19 No   Resident in a congregate (group) care setting No   Employed in healthcare setting No   Pregnant No      10/03/18 1851   Signed and Held  HIV antibody (Routine Testing)  Once,   R     Signed and Held          Vitals/Pain Today's Vitals   10/03/18 1830 10/03/18 1847 10/03/18 1900 10/03/18 2015  BP: (!) 99/46 (!) 93/55 (!) 95/47 (!) 87/45  Pulse:  70  77  Resp: 18 16 13 16   Temp:      TempSrc:      SpO2:  99%  99%  Weight:      Height:      PainSc:        Isolation Precautions No active isolations  Medications Medications  sodium chloride flush (NS) 0.9 % injection 3 mL (3 mLs Intravenous Given 10/03/18 1945)  sodium chloride 0.9 % bolus 250 mL (0 mLs Intravenous Stopped 10/03/18 1418)  sodium chloride 0.9 %  bolus 250 mL (0 mLs Intravenous Stopped 10/03/18 1945)  sodium chloride 0.9 % bolus 250 mL (0 mLs Intravenous Stopped 10/03/18 1946)  midodrine (PROAMATINE) tablet 10 mg (10 mg Oral Given 10/03/18 1933)    Mobility walks Moderate fall risk   Focused Assessments Neuro Assessment Handoff:  Swallow screen pass? Yes          Neuro Assessment: Exceptions to WDL Neuro Checks:      Last Documented NIHSS Modified Score:   Has TPA been given? No If patient is a Neuro Trauma and patient is going to OR before floor call report to [place] nurse: [phone] or [phone]     R Recommendations: See Admitting Provider Note  Report given to:   Additional Notes:

## 2018-10-04 DIAGNOSIS — I959 Hypotension, unspecified: Secondary | ICD-10-CM | POA: Diagnosis not present

## 2018-10-04 DIAGNOSIS — N186 End stage renal disease: Secondary | ICD-10-CM | POA: Diagnosis not present

## 2018-10-04 DIAGNOSIS — Z992 Dependence on renal dialysis: Secondary | ICD-10-CM | POA: Diagnosis not present

## 2018-10-04 DIAGNOSIS — I953 Hypotension of hemodialysis: Secondary | ICD-10-CM | POA: Diagnosis not present

## 2018-10-04 LAB — MRSA PCR SCREENING: MRSA by PCR: NEGATIVE

## 2018-10-04 LAB — SARS CORONAVIRUS 2 (TAT 6-24 HRS): SARS Coronavirus 2: NEGATIVE

## 2018-10-04 LAB — HIV ANTIBODY (ROUTINE TESTING W REFLEX): HIV Screen 4th Generation wRfx: NONREACTIVE

## 2018-10-04 NOTE — Discharge Instructions (Signed)
Thank you for allowing [place] to participate in your care!    You were admitted for low blood pressures after dialysis.  It is important that you follow-up with your nephrologist.  In the meantime I do recommend continuing your Midrin.  You may also take 5 mg extra on your nondialysis days if you are feeling lightheaded, however please also discuss this with your nephrologist.  If you experience worsening of your admission symptoms, develop shortness of breath, life threatening emergency, suicidal or homicidal thoughts you must seek medical attention immediately by calling 911 or calling your MD immediately  if symptoms less severe.

## 2018-10-04 NOTE — Progress Notes (Signed)
This RN was notified by NT that pt.'s BP was 78/38 mmHg,   Blood pressure rechecked manually it was 80/62mmHg. NP [place] text paged. Awaiting orders. Will continue to monitor the patient.

## 2018-10-04 NOTE — Discharge Summary (Addendum)
[facility] Discharge Summary  Patient name: [name] Marks Medical record number: [id number] Date of birth: [birth year] Age: 63 y.o. Gender: female Date of Admission: 10/03/2018  Date of Discharge: 10/04/2018  Admitting Physician: Zenia Resides, MD  Primary Care Provider: Patriciaann Clan, DO Consultants: nephro  Indication for Hospitalization: hypotension after HD  Discharge Diagnoses/Problem List:  Hypotension secondary to dialysis ESRD Bioprosthetic AV valve History of prior CVA (2018)  Disposition: Home  Discharge Condition: Stable  Discharge Exam:  General: NAD, pleasant, sitting up in bed talking on the phone Cardiovascular: RRR, no m/r/g, no LE edema Respiratory: CTA BL, normal work of breathing MSK: moves 4 extremities equally Derm: no rashes appreciated Neuro: CN II-XII grossly intact Psych: AOx3, appropriate affect  Brief Hospital Course:  [name] Marks is a 63 year old female who was admitted on 08/07 secondary to being hypotensive after dialysis.  Patient takes midodrine on days of dialysis.  Patient did not have any other symptoms and received a third dose of midodrine while in the ED.  Troponin, EKG and CXR all with no abnormalities.  Patient remained afebrile with improving hypotension during admission.  Patient has follow-up with the HD clinic on 10/06/2018.  She was instructed to take her home midodrine as needed for her low blood pressure until she follows up with nephrology.   Issues for Follow Up:  1. May need to increase patient's midodrine for her BP during HD. 2.   Ensure patient has close follow-up with nephrology for her hypotension during dialysis.  Significant Procedures: none  Significant Labs and Imaging:  Recent Labs  Lab 10/03/18 1134  WBC 9.1  HGB 10.4*  HCT 33.0*  PLT 260   Recent Labs  Lab 10/03/18 1134  NA 138  K 4.7  CL 94*  CO2 29  GLUCOSE 99  BUN 15  CREATININE 4.89*  CALCIUM 8.5*      Results/Tests Pending at Time of Discharge: none   Discharge Medications:  Allergies as of 10/04/2018      Reactions   Contrast Media [iodinated Diagnostic Agents] Anaphylaxis   Ancef [cefazolin] Other (See Comments)   Pt states she can not tolerate   Naproxen Sodium Itching      Medication List    TAKE these medications   albuterol 108 (90 Base) MCG/ACT inhaler Commonly known as: VENTOLIN HFA Inhale 2 puffs into the lungs as needed.   cinacalcet 60 MG tablet Commonly known as: SENSIPAR Take 60 mg by mouth at bedtime.   clopidogrel 75 MG tablet Commonly known as: PLAVIX Take 1 tablet (75 mg total) by mouth daily. What changed: when to take this   famotidine 20 MG tablet Commonly known as: PEPCID Take 20 mg by mouth 2 (two) times daily. Lunch and dinner   megestrol 40 MG tablet Commonly known as: MEGACE Take one tab BID x 14 days each month for 4 mos.   midodrine 10 MG tablet Commonly known as: PROAMATINE Take 1 tablet (10 mg total) by mouth 2 (two) times daily as needed (30 min prior to HD).   polyethylene glycol 17 g packet Commonly known as: MIRALAX / GLYCOLAX Take 17 g by mouth daily as needed for mild constipation.   PRORENAL QD PO Take 1 tablet by mouth at bedtime.   Renvela 800 MG tablet Generic drug: sevelamer carbonate Take 1,600 mg by mouth 3 (three) times daily with meals.   traMADol 50 MG tablet Commonly known as: ULTRAM Take 50 mg by  mouth every other day.       Discharge Instructions: Please refer to Patient Instructions section of EMR for full details.  Patient was counseled important signs and symptoms that should prompt return to medical care, changes in medications, dietary instructions, activity restrictions, and follow up appointments.   Follow-Up Appointments:   [name] Marks, [name], DO 10/04/2018, 9:09 AM PGY-3, [place]

## 2018-10-04 NOTE — Progress Notes (Signed)
[name] Marks to be discharged Home per MD order. Discussed prescriptions and follow up appointments with the patient. Medication list explained in detail. Patient verbalized understanding.  Skin clean, dry and intact without evidence of skin break down, no evidence of skin tears noted. IV catheter discontinued intact. Site without signs and symptoms of complications. Dressing and pressure applied. Pt denies pain at the site currently. No complaints noted.  Patient free of lines, drains, and wounds.   An After Visit Summary (AVS) was printed and given to the patient. Patient escorted via wheelchair, and discharged home via private auto.  Amaryllis Dyke, RN

## 2018-10-04 NOTE — Progress Notes (Signed)
NOtified MD regarding pts B/P 82/33, ordered to monitor the pt and will come and check the pt.

## 2018-10-06 DIAGNOSIS — D509 Iron deficiency anemia, unspecified: Secondary | ICD-10-CM | POA: Diagnosis not present

## 2018-10-06 DIAGNOSIS — D631 Anemia in chronic kidney disease: Secondary | ICD-10-CM | POA: Diagnosis not present

## 2018-10-06 DIAGNOSIS — Z992 Dependence on renal dialysis: Secondary | ICD-10-CM | POA: Diagnosis not present

## 2018-10-06 DIAGNOSIS — N186 End stage renal disease: Secondary | ICD-10-CM | POA: Diagnosis not present

## 2018-10-06 DIAGNOSIS — N2581 Secondary hyperparathyroidism of renal origin: Secondary | ICD-10-CM | POA: Diagnosis not present

## 2018-10-08 DIAGNOSIS — N186 End stage renal disease: Secondary | ICD-10-CM | POA: Diagnosis not present

## 2018-10-08 DIAGNOSIS — D631 Anemia in chronic kidney disease: Secondary | ICD-10-CM | POA: Diagnosis not present

## 2018-10-08 DIAGNOSIS — N2581 Secondary hyperparathyroidism of renal origin: Secondary | ICD-10-CM | POA: Diagnosis not present

## 2018-10-08 DIAGNOSIS — Z992 Dependence on renal dialysis: Secondary | ICD-10-CM | POA: Diagnosis not present

## 2018-10-08 DIAGNOSIS — D509 Iron deficiency anemia, unspecified: Secondary | ICD-10-CM | POA: Diagnosis not present

## 2018-10-09 ENCOUNTER — Other Ambulatory Visit: Payer: Self-pay

## 2018-10-09 ENCOUNTER — Ambulatory Visit (INDEPENDENT_AMBULATORY_CARE_PROVIDER_SITE_OTHER): Payer: Medicare Other

## 2018-10-09 ENCOUNTER — Ambulatory Visit (INDEPENDENT_AMBULATORY_CARE_PROVIDER_SITE_OTHER): Payer: Medicare Other | Admitting: Obstetrics & Gynecology

## 2018-10-09 DIAGNOSIS — N8501 Benign endometrial hyperplasia: Secondary | ICD-10-CM | POA: Diagnosis not present

## 2018-10-09 DIAGNOSIS — R9389 Abnormal findings on diagnostic imaging of other specified body structures: Secondary | ICD-10-CM

## 2018-10-09 DIAGNOSIS — M818 Other osteoporosis without current pathological fracture: Secondary | ICD-10-CM | POA: Diagnosis not present

## 2018-10-09 NOTE — Progress Notes (Signed)
    [name] Marks 22-Apr-1955 [id number]        63 y.o.  G2P2L2 Widowed.  Lives with her daughter.  RP: H/O Endometrial simple hyperplasia without atypia for f/u Pelvic US  HPI: EBx 02/2018 benign secretory endometrium, Megace stopped.  No PMB since then.  No pelvic pain.   OB History  Gravida Para Term Preterm AB Living  2 2       2   SAB TAB Ectopic Multiple Live Births               # Outcome Date GA Lbr Len/2nd Weight Sex Delivery Anes PTL Lv  2 Para           1 Para             Past medical history,surgical history, problem list, medications, allergies, family history and social history were all reviewed and documented in the EPIC chart.   Directed ROS with pertinent positives and negatives documented in the history of present illness/assessment and plan.  Exam:  There were no vitals filed for this visit. General appearance:  Normal  Pelvic US today: T/V images.  Anteverted uterus normal size measuring 7.66 x 5.06 x 4.46 cm.  Several small intramural fibroids the largest measured at 1.7 x 1.6 cm.  Thin symmetrical endometrial lining measured at 2.6 mm with no obvious mass or abnormal blood flow seen.  Both ovaries small with atrophic appearance.  No adnexal mass.  No free fluid in the posterior cul-de-sac.   Assessment/Plan:  63 y.o. G2P2   1. Simple endometrial hyperplasia without atypia History of simple endometrial hyperplasia without atypia but last endometrial biopsy January 2020 showed benign secretory endometrium and Megace was stopped at that time.  On pelvic ultrasound today the endometrial lining is very thin and normal at 2.6 mm.  Patient was reassured and will follow-up for her annual gynecologic exam.  2. Thickened endometrium As above.  3. Other osteoporosis, unspecified pathological fracture presence F/U Bone Density.  Then Annual/Gyn visit.  Counseling on above issues and coordination of care more than 50% for 15 minutes.  Princess Bruins MD, 11:47  AM 10/09/2018

## 2018-10-10 DIAGNOSIS — Z992 Dependence on renal dialysis: Secondary | ICD-10-CM | POA: Diagnosis not present

## 2018-10-10 DIAGNOSIS — N2581 Secondary hyperparathyroidism of renal origin: Secondary | ICD-10-CM | POA: Diagnosis not present

## 2018-10-10 DIAGNOSIS — D631 Anemia in chronic kidney disease: Secondary | ICD-10-CM | POA: Diagnosis not present

## 2018-10-10 DIAGNOSIS — D509 Iron deficiency anemia, unspecified: Secondary | ICD-10-CM | POA: Diagnosis not present

## 2018-10-10 DIAGNOSIS — N186 End stage renal disease: Secondary | ICD-10-CM | POA: Diagnosis not present

## 2018-10-13 ENCOUNTER — Encounter: Payer: Self-pay | Admitting: Obstetrics & Gynecology

## 2018-10-13 ENCOUNTER — Ambulatory Visit (INDEPENDENT_AMBULATORY_CARE_PROVIDER_SITE_OTHER): Payer: Medicare Other | Admitting: *Deleted

## 2018-10-13 DIAGNOSIS — I639 Cerebral infarction, unspecified: Secondary | ICD-10-CM

## 2018-10-13 DIAGNOSIS — D509 Iron deficiency anemia, unspecified: Secondary | ICD-10-CM | POA: Diagnosis not present

## 2018-10-13 DIAGNOSIS — Z992 Dependence on renal dialysis: Secondary | ICD-10-CM | POA: Diagnosis not present

## 2018-10-13 DIAGNOSIS — N186 End stage renal disease: Secondary | ICD-10-CM | POA: Diagnosis not present

## 2018-10-13 DIAGNOSIS — D631 Anemia in chronic kidney disease: Secondary | ICD-10-CM | POA: Diagnosis not present

## 2018-10-13 DIAGNOSIS — N2581 Secondary hyperparathyroidism of renal origin: Secondary | ICD-10-CM | POA: Diagnosis not present

## 2018-10-13 LAB — CUP PACEART REMOTE DEVICE CHECK
Date Time Interrogation Session: 20200817135143
Implantable Pulse Generator Implant Date: 20180806

## 2018-10-13 NOTE — Patient Instructions (Signed)
1. Simple endometrial hyperplasia without atypia History of simple endometrial hyperplasia without atypia but last endometrial biopsy January 2020 showed benign secretory endometrium and Megace was stopped at that time.  On pelvic ultrasound today the endometrial lining is very thin and normal at 2.6 mm.  Patient was reassured and will follow-up for her annual gynecologic exam.  2. Thickened endometrium As above.  3. Other osteoporosis, unspecified pathological fracture presence F/U Bone Density.  Then Annual/Gyn visit.  [name] Marks, it was a pleasure seeing you today!

## 2018-10-15 DIAGNOSIS — D509 Iron deficiency anemia, unspecified: Secondary | ICD-10-CM | POA: Diagnosis not present

## 2018-10-15 DIAGNOSIS — Z992 Dependence on renal dialysis: Secondary | ICD-10-CM | POA: Diagnosis not present

## 2018-10-15 DIAGNOSIS — N2581 Secondary hyperparathyroidism of renal origin: Secondary | ICD-10-CM | POA: Diagnosis not present

## 2018-10-15 DIAGNOSIS — D631 Anemia in chronic kidney disease: Secondary | ICD-10-CM | POA: Diagnosis not present

## 2018-10-15 DIAGNOSIS — N186 End stage renal disease: Secondary | ICD-10-CM | POA: Diagnosis not present

## 2018-10-17 DIAGNOSIS — N2581 Secondary hyperparathyroidism of renal origin: Secondary | ICD-10-CM | POA: Diagnosis not present

## 2018-10-17 DIAGNOSIS — Z992 Dependence on renal dialysis: Secondary | ICD-10-CM | POA: Diagnosis not present

## 2018-10-17 DIAGNOSIS — D509 Iron deficiency anemia, unspecified: Secondary | ICD-10-CM | POA: Diagnosis not present

## 2018-10-17 DIAGNOSIS — D631 Anemia in chronic kidney disease: Secondary | ICD-10-CM | POA: Diagnosis not present

## 2018-10-17 DIAGNOSIS — N186 End stage renal disease: Secondary | ICD-10-CM | POA: Diagnosis not present

## 2018-10-20 DIAGNOSIS — Z992 Dependence on renal dialysis: Secondary | ICD-10-CM | POA: Diagnosis not present

## 2018-10-20 DIAGNOSIS — D631 Anemia in chronic kidney disease: Secondary | ICD-10-CM | POA: Diagnosis not present

## 2018-10-20 DIAGNOSIS — N186 End stage renal disease: Secondary | ICD-10-CM | POA: Diagnosis not present

## 2018-10-20 DIAGNOSIS — N2581 Secondary hyperparathyroidism of renal origin: Secondary | ICD-10-CM | POA: Diagnosis not present

## 2018-10-20 DIAGNOSIS — D509 Iron deficiency anemia, unspecified: Secondary | ICD-10-CM | POA: Diagnosis not present

## 2018-10-21 NOTE — Progress Notes (Signed)
Carelink Summary Report / Loop Recorder 

## 2018-10-22 DIAGNOSIS — N2581 Secondary hyperparathyroidism of renal origin: Secondary | ICD-10-CM | POA: Diagnosis not present

## 2018-10-22 DIAGNOSIS — D631 Anemia in chronic kidney disease: Secondary | ICD-10-CM | POA: Diagnosis not present

## 2018-10-22 DIAGNOSIS — D509 Iron deficiency anemia, unspecified: Secondary | ICD-10-CM | POA: Diagnosis not present

## 2018-10-22 DIAGNOSIS — Z992 Dependence on renal dialysis: Secondary | ICD-10-CM | POA: Diagnosis not present

## 2018-10-22 DIAGNOSIS — N186 End stage renal disease: Secondary | ICD-10-CM | POA: Diagnosis not present

## 2018-10-24 DIAGNOSIS — Z992 Dependence on renal dialysis: Secondary | ICD-10-CM | POA: Diagnosis not present

## 2018-10-24 DIAGNOSIS — N186 End stage renal disease: Secondary | ICD-10-CM | POA: Diagnosis not present

## 2018-10-24 DIAGNOSIS — D631 Anemia in chronic kidney disease: Secondary | ICD-10-CM | POA: Diagnosis not present

## 2018-10-24 DIAGNOSIS — N2581 Secondary hyperparathyroidism of renal origin: Secondary | ICD-10-CM | POA: Diagnosis not present

## 2018-10-24 DIAGNOSIS — D509 Iron deficiency anemia, unspecified: Secondary | ICD-10-CM | POA: Diagnosis not present

## 2018-10-27 DIAGNOSIS — N2581 Secondary hyperparathyroidism of renal origin: Secondary | ICD-10-CM | POA: Diagnosis not present

## 2018-10-27 DIAGNOSIS — D509 Iron deficiency anemia, unspecified: Secondary | ICD-10-CM | POA: Diagnosis not present

## 2018-10-27 DIAGNOSIS — D631 Anemia in chronic kidney disease: Secondary | ICD-10-CM | POA: Diagnosis not present

## 2018-10-27 DIAGNOSIS — Z992 Dependence on renal dialysis: Secondary | ICD-10-CM | POA: Diagnosis not present

## 2018-10-27 DIAGNOSIS — N186 End stage renal disease: Secondary | ICD-10-CM | POA: Diagnosis not present

## 2018-10-28 DIAGNOSIS — N186 End stage renal disease: Secondary | ICD-10-CM | POA: Diagnosis not present

## 2018-10-28 DIAGNOSIS — N039 Chronic nephritic syndrome with unspecified morphologic changes: Secondary | ICD-10-CM | POA: Diagnosis not present

## 2018-10-28 DIAGNOSIS — Z992 Dependence on renal dialysis: Secondary | ICD-10-CM | POA: Diagnosis not present

## 2018-10-29 DIAGNOSIS — Z992 Dependence on renal dialysis: Secondary | ICD-10-CM | POA: Diagnosis not present

## 2018-10-29 DIAGNOSIS — N2581 Secondary hyperparathyroidism of renal origin: Secondary | ICD-10-CM | POA: Diagnosis not present

## 2018-10-29 DIAGNOSIS — D631 Anemia in chronic kidney disease: Secondary | ICD-10-CM | POA: Diagnosis not present

## 2018-10-29 DIAGNOSIS — D509 Iron deficiency anemia, unspecified: Secondary | ICD-10-CM | POA: Diagnosis not present

## 2018-10-29 DIAGNOSIS — N186 End stage renal disease: Secondary | ICD-10-CM | POA: Diagnosis not present

## 2018-10-31 DIAGNOSIS — D509 Iron deficiency anemia, unspecified: Secondary | ICD-10-CM | POA: Diagnosis not present

## 2018-10-31 DIAGNOSIS — N2581 Secondary hyperparathyroidism of renal origin: Secondary | ICD-10-CM | POA: Diagnosis not present

## 2018-10-31 DIAGNOSIS — D631 Anemia in chronic kidney disease: Secondary | ICD-10-CM | POA: Diagnosis not present

## 2018-10-31 DIAGNOSIS — N186 End stage renal disease: Secondary | ICD-10-CM | POA: Diagnosis not present

## 2018-10-31 DIAGNOSIS — Z992 Dependence on renal dialysis: Secondary | ICD-10-CM | POA: Diagnosis not present

## 2018-11-03 DIAGNOSIS — D631 Anemia in chronic kidney disease: Secondary | ICD-10-CM | POA: Diagnosis not present

## 2018-11-03 DIAGNOSIS — N2581 Secondary hyperparathyroidism of renal origin: Secondary | ICD-10-CM | POA: Diagnosis not present

## 2018-11-03 DIAGNOSIS — Z992 Dependence on renal dialysis: Secondary | ICD-10-CM | POA: Diagnosis not present

## 2018-11-03 DIAGNOSIS — D509 Iron deficiency anemia, unspecified: Secondary | ICD-10-CM | POA: Diagnosis not present

## 2018-11-03 DIAGNOSIS — N186 End stage renal disease: Secondary | ICD-10-CM | POA: Diagnosis not present

## 2018-11-05 DIAGNOSIS — N2581 Secondary hyperparathyroidism of renal origin: Secondary | ICD-10-CM | POA: Diagnosis not present

## 2018-11-05 DIAGNOSIS — D509 Iron deficiency anemia, unspecified: Secondary | ICD-10-CM | POA: Diagnosis not present

## 2018-11-05 DIAGNOSIS — Z992 Dependence on renal dialysis: Secondary | ICD-10-CM | POA: Diagnosis not present

## 2018-11-05 DIAGNOSIS — D631 Anemia in chronic kidney disease: Secondary | ICD-10-CM | POA: Diagnosis not present

## 2018-11-05 DIAGNOSIS — N186 End stage renal disease: Secondary | ICD-10-CM | POA: Diagnosis not present

## 2018-11-06 ENCOUNTER — Ambulatory Visit: Payer: Medicare Other

## 2018-11-06 ENCOUNTER — Other Ambulatory Visit: Payer: Self-pay

## 2018-11-07 DIAGNOSIS — N186 End stage renal disease: Secondary | ICD-10-CM | POA: Diagnosis not present

## 2018-11-07 DIAGNOSIS — N2581 Secondary hyperparathyroidism of renal origin: Secondary | ICD-10-CM | POA: Diagnosis not present

## 2018-11-07 DIAGNOSIS — Z992 Dependence on renal dialysis: Secondary | ICD-10-CM | POA: Diagnosis not present

## 2018-11-07 DIAGNOSIS — D509 Iron deficiency anemia, unspecified: Secondary | ICD-10-CM | POA: Diagnosis not present

## 2018-11-07 DIAGNOSIS — D631 Anemia in chronic kidney disease: Secondary | ICD-10-CM | POA: Diagnosis not present

## 2018-11-10 DIAGNOSIS — D631 Anemia in chronic kidney disease: Secondary | ICD-10-CM | POA: Diagnosis not present

## 2018-11-10 DIAGNOSIS — D509 Iron deficiency anemia, unspecified: Secondary | ICD-10-CM | POA: Diagnosis not present

## 2018-11-10 DIAGNOSIS — N186 End stage renal disease: Secondary | ICD-10-CM | POA: Diagnosis not present

## 2018-11-10 DIAGNOSIS — N2581 Secondary hyperparathyroidism of renal origin: Secondary | ICD-10-CM | POA: Diagnosis not present

## 2018-11-10 DIAGNOSIS — Z992 Dependence on renal dialysis: Secondary | ICD-10-CM | POA: Diagnosis not present

## 2018-11-12 DIAGNOSIS — Z992 Dependence on renal dialysis: Secondary | ICD-10-CM | POA: Diagnosis not present

## 2018-11-12 DIAGNOSIS — N2581 Secondary hyperparathyroidism of renal origin: Secondary | ICD-10-CM | POA: Diagnosis not present

## 2018-11-12 DIAGNOSIS — N186 End stage renal disease: Secondary | ICD-10-CM | POA: Diagnosis not present

## 2018-11-12 DIAGNOSIS — D631 Anemia in chronic kidney disease: Secondary | ICD-10-CM | POA: Diagnosis not present

## 2018-11-12 DIAGNOSIS — D509 Iron deficiency anemia, unspecified: Secondary | ICD-10-CM | POA: Diagnosis not present

## 2018-11-13 IMAGING — MR MR LUMBAR SPINE W/O CM
4 of 5 series · 18 of 48 positions shown · non-contrast
Comparison: None.

CLINICAL DATA: Bacteremia. Concern for osteomyelitis or epidural
abscess.

EXAM:
MRI LUMBAR SPINE WITHOUT CONTRAST
TECHNIQUE: Multiplanar, multisequence MR imaging of the lumbar spine was
performed. No intravenous contrast was administered.

[Series 4: T2 · sagittal · 4.0mm · 0.55mm/px · 5 of 14 slices shown (1 of 2)]
[im 1/14]
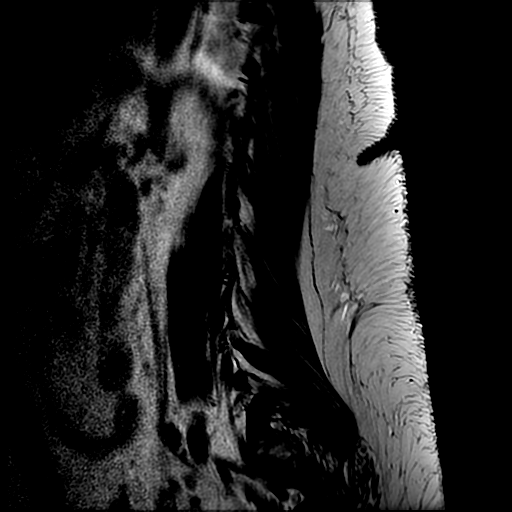
[im 4/14]
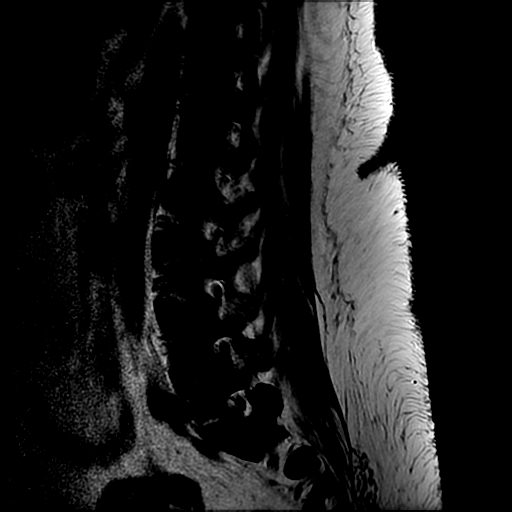
[im 7/14]
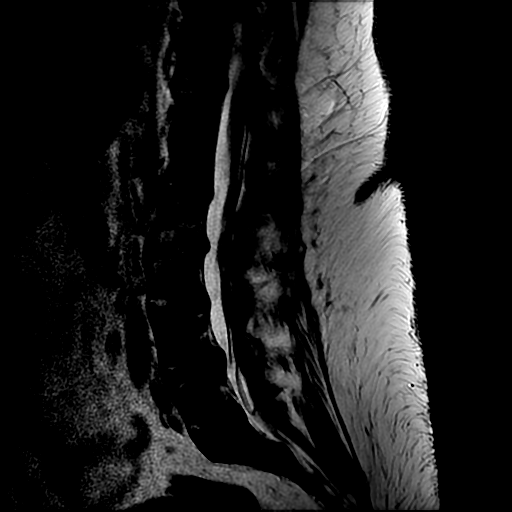
[im 10/14]
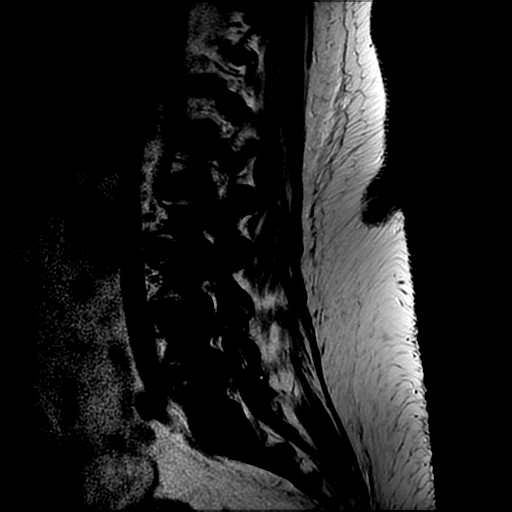
[im 14/14]
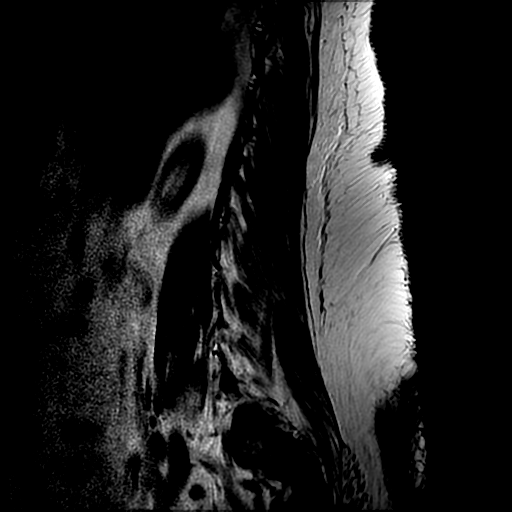

[Series 5: T1 · sagittal · 4.0mm · 0.55mm/px · 3 of 14 slices shown (1 of 2)]
[im 1/14]
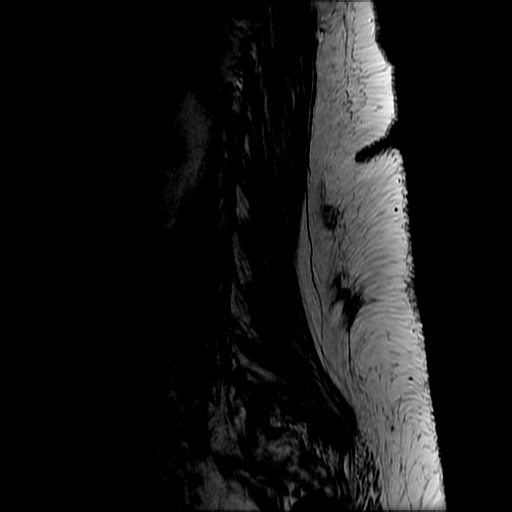
[im 7/14]
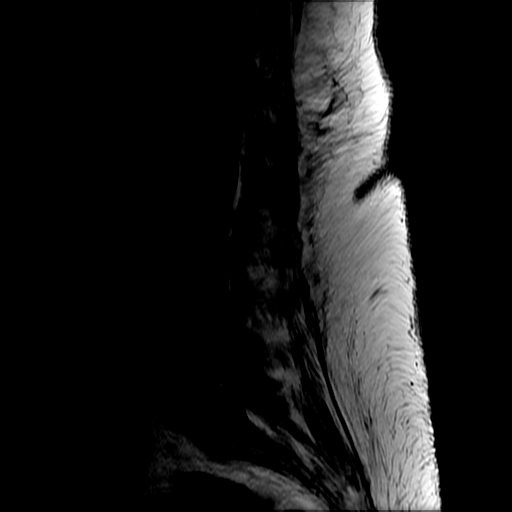
[im 14/14]
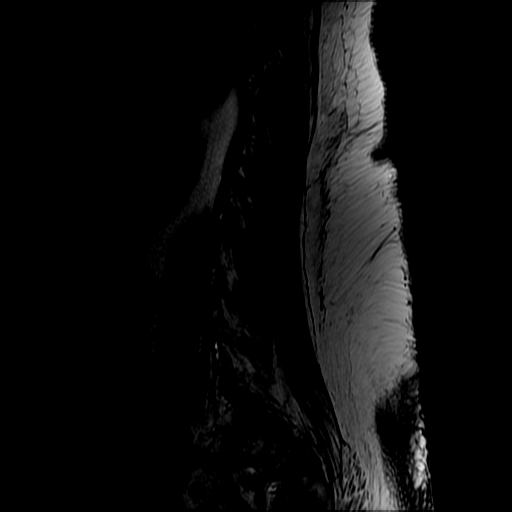

[Series 7: T2 · axial · 5.0mm · 0.39mm/px · z∈[-15,+139]mm · 7 of 40 slices shown (2 of 2)]
[im 3/40]
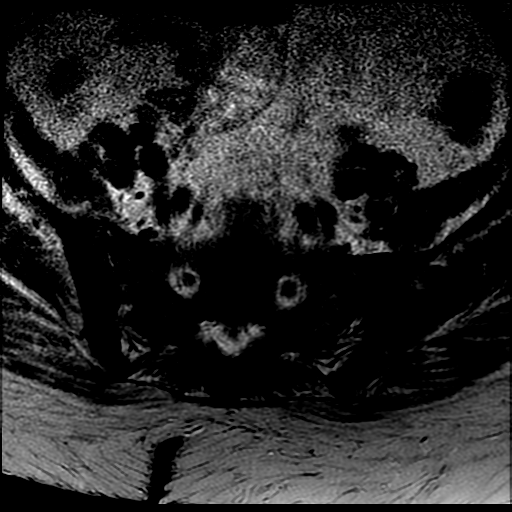
[im 6/40]
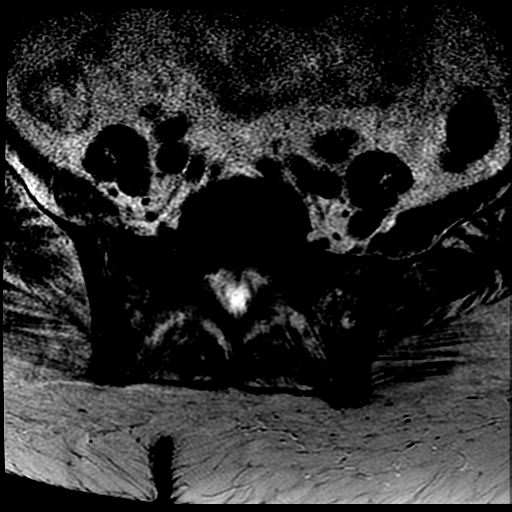
[im 8/40]
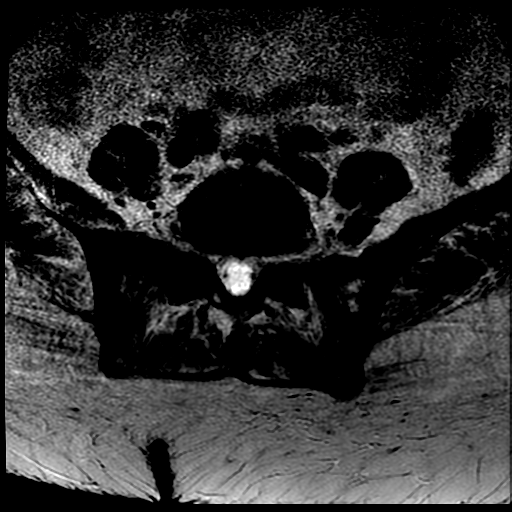
[im 14/40]
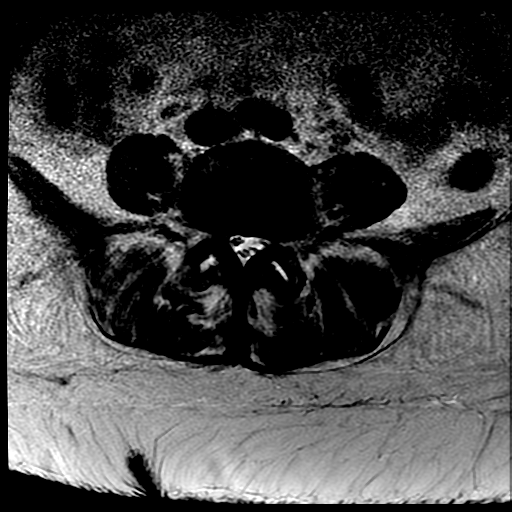
[im 19/40]
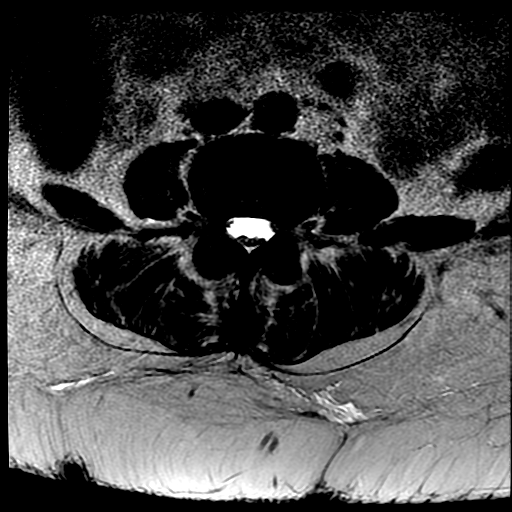
[im 21/40]
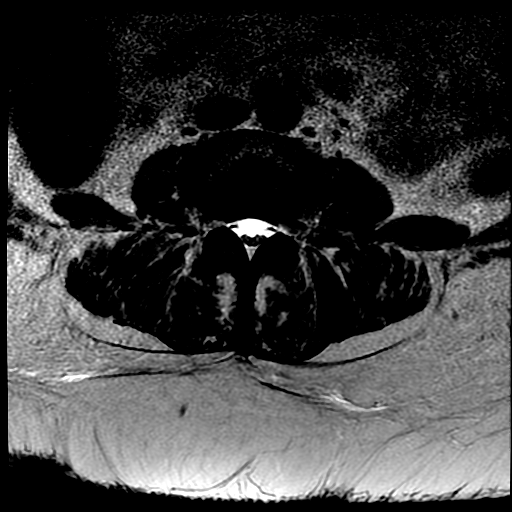
[im 34/40]
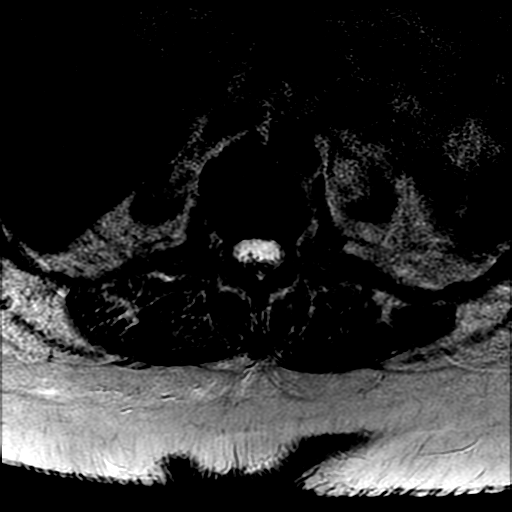

[Series 8: T1 · axial · 5.0mm · 0.39mm/px · z∈[-0,+139]mm · 3 of 40 slices shown (2 of 2)]
[im 6/40]
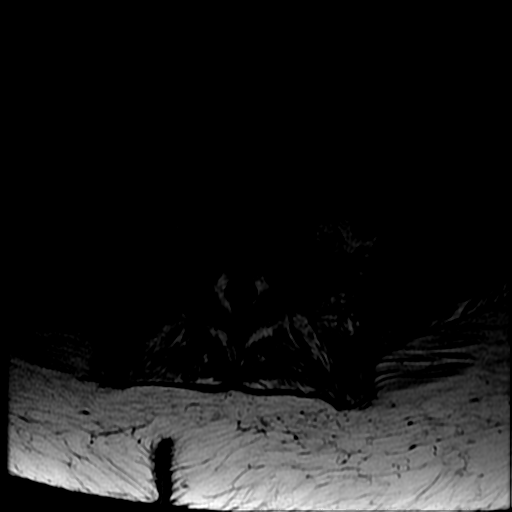
[im 21/40]
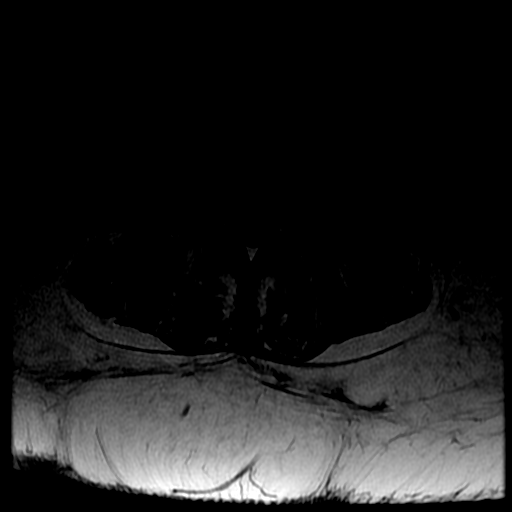
[im 34/40]
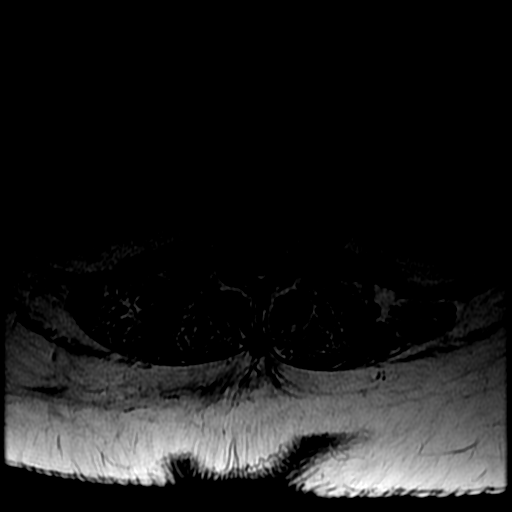

[18 of 48 positions shown; findings below may reference images not displayed]

FINDINGS: Despite efforts by the technologist and patient, motion artifact is
present on today's examination and could not be eliminated. This
reduces the sensitivity and specificity of the study.

Segmentation:  Normal

Alignment:  Normal

Vertebrae: There is generally decreased T1 and T2 weighted signal
throughout the vertebral bodies. There is multilevel degenerative
endplate signal change. There are Schmorl's nodes at the inferior L2
endplate and superior L4 endplate. There is no evidence of discitis
osteomyelitis. No acute compression fracture.

Conus medullaris: Extends to the L1 level and appears normal. No
epidural abscess, hematoma or fluid collection.

Paraspinal and other soft tissues: The visualized aorta, IVC and
iliac vessels are normal. The visualized retroperitoneal organs and
paraspinal soft tissues are normal.

Disc levels:

T10-T12: These levels are evaluated on sagittal sequences only.
There is no spinal canal or neural foraminal stenosis.

T12-L1:: Normal disc space and facets. No spinal canal or
neuroforaminal stenosis.

L1-L2: Mild disc desiccation with minimal bulge. No spinal canal or
neural foraminal stenosis. Normal facets.

L2-L3: Disc space narrowing and mild facet hypertrophy with small
disc bulge. No spinal canal stenosis. No neural foraminal stenosis.

L3-L4: Disc desiccation with minimal bulge. Mild bilateral facet
hypertrophy. No spinal canal stenosis. No neural foraminal stenosis.

L4-L5: Disc desiccation with medium-sized bulge. Bilateral severe
facet hypertrophy with widening of both joint spaces and fluid
within both facet joints. No spinal canal or neural foraminal
stenosis.

L5-S1: Disc desiccation and mild facet hypertrophy. No spinal canal
or neural foraminal stenosis.
IMPRESSION: 1. No evidence of discitis osteomyelitis or epidural abscess.
2. Severe facet arthrosis bilaterally at L4-L5 with widening of the
facet joints that may indicate a degree of segmental instability.

## 2018-11-14 ENCOUNTER — Telehealth: Payer: Self-pay | Admitting: Emergency Medicine

## 2018-11-14 DIAGNOSIS — D509 Iron deficiency anemia, unspecified: Secondary | ICD-10-CM | POA: Diagnosis not present

## 2018-11-14 DIAGNOSIS — N2581 Secondary hyperparathyroidism of renal origin: Secondary | ICD-10-CM | POA: Diagnosis not present

## 2018-11-14 DIAGNOSIS — D631 Anemia in chronic kidney disease: Secondary | ICD-10-CM | POA: Diagnosis not present

## 2018-11-14 DIAGNOSIS — N186 End stage renal disease: Secondary | ICD-10-CM | POA: Diagnosis not present

## 2018-11-14 DIAGNOSIS — Z992 Dependence on renal dialysis: Secondary | ICD-10-CM | POA: Diagnosis not present

## 2018-11-14 NOTE — Telephone Encounter (Signed)
LMOM( per DPR) for patient to send manual transmission and to contact DC at number provided for assistance. Marland Kitchen LINQ alert for possible AF.

## 2018-11-17 ENCOUNTER — Ambulatory Visit (INDEPENDENT_AMBULATORY_CARE_PROVIDER_SITE_OTHER): Payer: Medicare Other | Admitting: *Deleted

## 2018-11-17 DIAGNOSIS — I639 Cerebral infarction, unspecified: Secondary | ICD-10-CM | POA: Diagnosis not present

## 2018-11-17 DIAGNOSIS — N186 End stage renal disease: Secondary | ICD-10-CM | POA: Diagnosis not present

## 2018-11-17 DIAGNOSIS — N2581 Secondary hyperparathyroidism of renal origin: Secondary | ICD-10-CM | POA: Diagnosis not present

## 2018-11-17 DIAGNOSIS — Z992 Dependence on renal dialysis: Secondary | ICD-10-CM | POA: Diagnosis not present

## 2018-11-17 DIAGNOSIS — D631 Anemia in chronic kidney disease: Secondary | ICD-10-CM | POA: Diagnosis not present

## 2018-11-17 DIAGNOSIS — D509 Iron deficiency anemia, unspecified: Secondary | ICD-10-CM | POA: Diagnosis not present

## 2018-11-17 LAB — CUP PACEART REMOTE DEVICE CHECK
Date Time Interrogation Session: 20200919154002
Implantable Pulse Generator Implant Date: 20180806

## 2018-11-17 NOTE — Telephone Encounter (Signed)
Spoke w/ pt and requested that she send a manual transmission w/ her home monitor. Instructed her how to do this. Transmission received.

## 2018-11-17 NOTE — Telephone Encounter (Signed)
Remote ransmission received and 9 AF episodes appear to be false. Appear to be Sinus rhythm with PACs and Sinus  Arrhythmia.

## 2018-11-19 DIAGNOSIS — N2581 Secondary hyperparathyroidism of renal origin: Secondary | ICD-10-CM | POA: Diagnosis not present

## 2018-11-19 DIAGNOSIS — D509 Iron deficiency anemia, unspecified: Secondary | ICD-10-CM | POA: Diagnosis not present

## 2018-11-19 DIAGNOSIS — N186 End stage renal disease: Secondary | ICD-10-CM | POA: Diagnosis not present

## 2018-11-19 DIAGNOSIS — Z992 Dependence on renal dialysis: Secondary | ICD-10-CM | POA: Diagnosis not present

## 2018-11-19 DIAGNOSIS — D631 Anemia in chronic kidney disease: Secondary | ICD-10-CM | POA: Diagnosis not present

## 2018-11-21 DIAGNOSIS — N186 End stage renal disease: Secondary | ICD-10-CM | POA: Diagnosis not present

## 2018-11-21 DIAGNOSIS — D631 Anemia in chronic kidney disease: Secondary | ICD-10-CM | POA: Diagnosis not present

## 2018-11-21 DIAGNOSIS — N2581 Secondary hyperparathyroidism of renal origin: Secondary | ICD-10-CM | POA: Diagnosis not present

## 2018-11-21 DIAGNOSIS — D509 Iron deficiency anemia, unspecified: Secondary | ICD-10-CM | POA: Diagnosis not present

## 2018-11-21 DIAGNOSIS — Z992 Dependence on renal dialysis: Secondary | ICD-10-CM | POA: Diagnosis not present

## 2018-11-24 ENCOUNTER — Other Ambulatory Visit: Payer: Self-pay

## 2018-11-24 DIAGNOSIS — Z992 Dependence on renal dialysis: Secondary | ICD-10-CM | POA: Diagnosis not present

## 2018-11-24 DIAGNOSIS — N186 End stage renal disease: Secondary | ICD-10-CM | POA: Diagnosis not present

## 2018-11-24 DIAGNOSIS — N2581 Secondary hyperparathyroidism of renal origin: Secondary | ICD-10-CM | POA: Diagnosis not present

## 2018-11-24 DIAGNOSIS — D631 Anemia in chronic kidney disease: Secondary | ICD-10-CM | POA: Diagnosis not present

## 2018-11-24 DIAGNOSIS — D509 Iron deficiency anemia, unspecified: Secondary | ICD-10-CM | POA: Diagnosis not present

## 2018-11-24 NOTE — Progress Notes (Signed)
Carelink Summary Report / Loop Recorder 

## 2018-11-25 ENCOUNTER — Ambulatory Visit (INDEPENDENT_AMBULATORY_CARE_PROVIDER_SITE_OTHER): Payer: Medicare Other | Admitting: Obstetrics & Gynecology

## 2018-11-25 ENCOUNTER — Encounter: Payer: Self-pay | Admitting: Gynecology

## 2018-11-25 ENCOUNTER — Encounter: Payer: Self-pay | Admitting: Obstetrics & Gynecology

## 2018-11-25 VITALS — BP 132/88 | Ht 60.0 in | Wt 166.0 lb

## 2018-11-25 DIAGNOSIS — Z01419 Encounter for gynecological examination (general) (routine) without abnormal findings: Secondary | ICD-10-CM

## 2018-11-25 DIAGNOSIS — E6609 Other obesity due to excess calories: Secondary | ICD-10-CM

## 2018-11-25 DIAGNOSIS — Z780 Asymptomatic menopausal state: Secondary | ICD-10-CM

## 2018-11-25 DIAGNOSIS — M8589 Other specified disorders of bone density and structure, multiple sites: Secondary | ICD-10-CM

## 2018-11-25 DIAGNOSIS — Z6832 Body mass index (BMI) 32.0-32.9, adult: Secondary | ICD-10-CM

## 2018-11-25 NOTE — Progress Notes (Signed)
[name] Marks Feb 04, 1956 [id number]   History:    63 y.o. G2P2L2 Widowed.  4 grand-children.  RP:  Estalbished patient presenting for annual gyn exam   HPI: Postmenopause, well on no HRT.  No PMB.  H/O Simple Endometrial Hyperplasia on Megace.  EBx benign 02/2018, Megace stopped. Pelvic US 09/2018 showed a thin Endometrial line at 2.6 mm.  No pelvic pain.  Abstinent.  Urine/BMs normal.  Breasts normal.  BMI 32.42.  Needs to exercise more.  Health labs with Fam MD.  Kidney failure on Hemodialysis 3x a week.  Past medical history,surgical history, family history and social history were all reviewed and documented in the EPIC chart.  Gynecologic History No LMP recorded. Patient is postmenopausal. Contraception: post menopausal status Last Pap: 08/2017. Results were: Negative Last mammogram: 07/2017. Results were: Negative Bone Density: 10/2018 Osteopenia Colonoscopy: 2016  Obstetric History OB History  Gravida Para Term Preterm AB Living  2 2       2   SAB TAB Ectopic Multiple Live Births               # Outcome Date GA Lbr Len/2nd Weight Sex Delivery Anes PTL Lv  2 Para           1 Para              ROS: A ROS was performed and pertinent positives and negatives are included in the history.  GENERAL: No fevers or chills. HEENT: No change in vision, no earache, sore throat or sinus congestion. NECK: No pain or stiffness. CARDIOVASCULAR: No chest pain or pressure. No palpitations. PULMONARY: No shortness of breath, cough or wheeze. GASTROINTESTINAL: No abdominal pain, nausea, vomiting or diarrhea, melena or bright red blood per rectum. GENITOURINARY: No urinary frequency, urgency, hesitancy or dysuria. MUSCULOSKELETAL: No joint or muscle pain, no back pain, no recent trauma. DERMATOLOGIC: No rash, no itching, no lesions. ENDOCRINE: No polyuria, polydipsia, no heat or cold intolerance. No recent change in weight. HEMATOLOGICAL: No anemia or easy bruising or bleeding. NEUROLOGIC: No  headache, seizures, numbness, tingling or weakness. PSYCHIATRIC: No depression, no loss of interest in normal activity or change in sleep pattern.     Exam:   BP 132/88   Ht 5' (1.524 m)   Wt 166 lb (75.3 kg)   BMI 32.42 kg/m   Body mass index is 32.42 kg/m.  General appearance : Well developed well nourished female. No acute distress HEENT: Eyes: no retinal hemorrhage or exudates,  Neck supple, trachea midline, no carotid bruits, no thyroidmegaly Lungs: Clear to auscultation, no rhonchi or wheezes, or rib retractions  Heart: Regular rate and rhythm, no murmurs or gallops Breast:Examined in sitting and supine position were symmetrical in appearance, no palpable masses or tenderness,  no skin retraction, no nipple inversion, no nipple discharge, no skin discoloration, no axillary or supraclavicular lymphadenopathy Abdomen: no palpable masses or tenderness, no rebound or guarding Extremities: no edema or skin discoloration or tenderness  Pelvic: Vulva: Normal             Vagina: No gross lesions or discharge  Cervix: No gross lesions or discharge  Uterus  AV, normal size, shape and consistency, non-tender and mobile  Adnexa  Without masses or tenderness  Anus: Normal   Assessment/Plan:  63 y.o. female for annual exam   1. Well female exam with routine gynecological exam Normal gynecologic exam.  Last Pap test July 2019 was negative, no indication to repeat this year.  Breast  exam normal.  Last screening mammogram June 2019 was negative, patient will schedule a screening mammogram now.  Colonoscopy 2016.  Health labs with family physician and nephrologist..  Patient has kidney failure for which she has hemodialysis 3 times a week.  2. Postmenopause Well on no hormone replacement therapy.  No postmenopausal bleeding.  3. Osteopenia of multiple sites Recent bone density September 2020 showing osteopenia with lowest site at the right femoral neck with a T score of -2.4.  Vitamin D  supplements, calcium intake of a total of 1200 mg daily and regular weightbearing physical activity is recommended.  As patient has kidney failure on hemodialysis, will consult with nephrologist to assure the best bone protection possible in that setting.  We will repeat a bone density in 2 years.  4. Class 1 obesity due to excess calories with serious comorbidity and body mass index (BMI) of 32.0 to 32.9 in adult Recommend a lower calorie/carb diet such as [employer], will discuss with nephrologist.  Aerobic activities 5 times a week and weightlifting every 2 days.  Princess Bruins MD, 11:42 AM 11/25/2018

## 2018-11-26 DIAGNOSIS — Z992 Dependence on renal dialysis: Secondary | ICD-10-CM | POA: Diagnosis not present

## 2018-11-26 DIAGNOSIS — N186 End stage renal disease: Secondary | ICD-10-CM | POA: Diagnosis not present

## 2018-11-26 DIAGNOSIS — D631 Anemia in chronic kidney disease: Secondary | ICD-10-CM | POA: Diagnosis not present

## 2018-11-26 DIAGNOSIS — N2581 Secondary hyperparathyroidism of renal origin: Secondary | ICD-10-CM | POA: Diagnosis not present

## 2018-11-26 DIAGNOSIS — D509 Iron deficiency anemia, unspecified: Secondary | ICD-10-CM | POA: Diagnosis not present

## 2018-11-27 ENCOUNTER — Encounter: Payer: Self-pay | Admitting: Obstetrics & Gynecology

## 2018-11-27 DIAGNOSIS — Z992 Dependence on renal dialysis: Secondary | ICD-10-CM | POA: Diagnosis not present

## 2018-11-27 DIAGNOSIS — N039 Chronic nephritic syndrome with unspecified morphologic changes: Secondary | ICD-10-CM | POA: Diagnosis not present

## 2018-11-27 DIAGNOSIS — N186 End stage renal disease: Secondary | ICD-10-CM | POA: Diagnosis not present

## 2018-11-27 NOTE — Patient Instructions (Signed)
1. Well female exam with routine gynecological exam Normal gynecologic exam.  Last Pap test July 2019 was negative, no indication to repeat this year.  Breast exam normal.  Last screening mammogram June 2019 was negative, patient will schedule a screening mammogram now.  Colonoscopy 2016.  Health labs with family physician and nephrologist..  Patient has kidney failure for which she has hemodialysis 3 times a week.  2. Postmenopause Well on no hormone replacement therapy.  No postmenopausal bleeding.  3. Osteopenia of multiple sites Recent bone density September 2020 showing osteopenia with lowest site at the right femoral neck with a T score of -2.4.  Vitamin D supplements, calcium intake of a total of 1200 mg daily and regular weightbearing physical activity is recommended.  As patient has kidney failure on hemodialysis, will consult with nephrologist to assure the best bone protection possible in that setting.  We will repeat a bone density in 2 years.  4. Class 1 obesity due to excess calories with serious comorbidity and body mass index (BMI) of 32.0 to 32.9 in adult Recommend a lower calorie/carb diet such as [employer], will discuss with nephrologist.  Aerobic activities 5 times a week and weightlifting every 2 days.  Rane, it was a pleasure seeing you today!

## 2018-11-28 DIAGNOSIS — D509 Iron deficiency anemia, unspecified: Secondary | ICD-10-CM | POA: Diagnosis not present

## 2018-11-28 DIAGNOSIS — Z23 Encounter for immunization: Secondary | ICD-10-CM | POA: Diagnosis not present

## 2018-11-28 DIAGNOSIS — Z992 Dependence on renal dialysis: Secondary | ICD-10-CM | POA: Diagnosis not present

## 2018-11-28 DIAGNOSIS — N186 End stage renal disease: Secondary | ICD-10-CM | POA: Diagnosis not present

## 2018-11-28 DIAGNOSIS — N2581 Secondary hyperparathyroidism of renal origin: Secondary | ICD-10-CM | POA: Diagnosis not present

## 2018-11-28 DIAGNOSIS — D631 Anemia in chronic kidney disease: Secondary | ICD-10-CM | POA: Diagnosis not present

## 2018-12-01 DIAGNOSIS — N186 End stage renal disease: Secondary | ICD-10-CM | POA: Diagnosis not present

## 2018-12-01 DIAGNOSIS — Z23 Encounter for immunization: Secondary | ICD-10-CM | POA: Diagnosis not present

## 2018-12-01 DIAGNOSIS — N2581 Secondary hyperparathyroidism of renal origin: Secondary | ICD-10-CM | POA: Diagnosis not present

## 2018-12-01 DIAGNOSIS — D509 Iron deficiency anemia, unspecified: Secondary | ICD-10-CM | POA: Diagnosis not present

## 2018-12-01 DIAGNOSIS — D631 Anemia in chronic kidney disease: Secondary | ICD-10-CM | POA: Diagnosis not present

## 2018-12-01 DIAGNOSIS — Z992 Dependence on renal dialysis: Secondary | ICD-10-CM | POA: Diagnosis not present

## 2018-12-03 DIAGNOSIS — D509 Iron deficiency anemia, unspecified: Secondary | ICD-10-CM | POA: Diagnosis not present

## 2018-12-03 DIAGNOSIS — Z992 Dependence on renal dialysis: Secondary | ICD-10-CM | POA: Diagnosis not present

## 2018-12-03 DIAGNOSIS — Z23 Encounter for immunization: Secondary | ICD-10-CM | POA: Diagnosis not present

## 2018-12-03 DIAGNOSIS — D631 Anemia in chronic kidney disease: Secondary | ICD-10-CM | POA: Diagnosis not present

## 2018-12-03 DIAGNOSIS — N2581 Secondary hyperparathyroidism of renal origin: Secondary | ICD-10-CM | POA: Diagnosis not present

## 2018-12-03 DIAGNOSIS — N186 End stage renal disease: Secondary | ICD-10-CM | POA: Diagnosis not present

## 2018-12-05 DIAGNOSIS — N186 End stage renal disease: Secondary | ICD-10-CM | POA: Diagnosis not present

## 2018-12-05 DIAGNOSIS — D631 Anemia in chronic kidney disease: Secondary | ICD-10-CM | POA: Diagnosis not present

## 2018-12-05 DIAGNOSIS — N2581 Secondary hyperparathyroidism of renal origin: Secondary | ICD-10-CM | POA: Diagnosis not present

## 2018-12-05 DIAGNOSIS — Z23 Encounter for immunization: Secondary | ICD-10-CM | POA: Diagnosis not present

## 2018-12-05 DIAGNOSIS — D509 Iron deficiency anemia, unspecified: Secondary | ICD-10-CM | POA: Diagnosis not present

## 2018-12-05 DIAGNOSIS — Z992 Dependence on renal dialysis: Secondary | ICD-10-CM | POA: Diagnosis not present

## 2018-12-08 DIAGNOSIS — Z23 Encounter for immunization: Secondary | ICD-10-CM | POA: Diagnosis not present

## 2018-12-08 DIAGNOSIS — Z992 Dependence on renal dialysis: Secondary | ICD-10-CM | POA: Diagnosis not present

## 2018-12-08 DIAGNOSIS — N2581 Secondary hyperparathyroidism of renal origin: Secondary | ICD-10-CM | POA: Diagnosis not present

## 2018-12-08 DIAGNOSIS — D631 Anemia in chronic kidney disease: Secondary | ICD-10-CM | POA: Diagnosis not present

## 2018-12-08 DIAGNOSIS — N186 End stage renal disease: Secondary | ICD-10-CM | POA: Diagnosis not present

## 2018-12-08 DIAGNOSIS — D509 Iron deficiency anemia, unspecified: Secondary | ICD-10-CM | POA: Diagnosis not present

## 2018-12-10 DIAGNOSIS — D509 Iron deficiency anemia, unspecified: Secondary | ICD-10-CM | POA: Diagnosis not present

## 2018-12-10 DIAGNOSIS — Z992 Dependence on renal dialysis: Secondary | ICD-10-CM | POA: Diagnosis not present

## 2018-12-10 DIAGNOSIS — N2581 Secondary hyperparathyroidism of renal origin: Secondary | ICD-10-CM | POA: Diagnosis not present

## 2018-12-10 DIAGNOSIS — N186 End stage renal disease: Secondary | ICD-10-CM | POA: Diagnosis not present

## 2018-12-10 DIAGNOSIS — Z23 Encounter for immunization: Secondary | ICD-10-CM | POA: Diagnosis not present

## 2018-12-10 DIAGNOSIS — D631 Anemia in chronic kidney disease: Secondary | ICD-10-CM | POA: Diagnosis not present

## 2018-12-12 DIAGNOSIS — D631 Anemia in chronic kidney disease: Secondary | ICD-10-CM | POA: Diagnosis not present

## 2018-12-12 DIAGNOSIS — D509 Iron deficiency anemia, unspecified: Secondary | ICD-10-CM | POA: Diagnosis not present

## 2018-12-12 DIAGNOSIS — N186 End stage renal disease: Secondary | ICD-10-CM | POA: Diagnosis not present

## 2018-12-12 DIAGNOSIS — Z23 Encounter for immunization: Secondary | ICD-10-CM | POA: Diagnosis not present

## 2018-12-12 DIAGNOSIS — N2581 Secondary hyperparathyroidism of renal origin: Secondary | ICD-10-CM | POA: Diagnosis not present

## 2018-12-12 DIAGNOSIS — Z992 Dependence on renal dialysis: Secondary | ICD-10-CM | POA: Diagnosis not present

## 2018-12-15 DIAGNOSIS — N2581 Secondary hyperparathyroidism of renal origin: Secondary | ICD-10-CM | POA: Diagnosis not present

## 2018-12-15 DIAGNOSIS — D509 Iron deficiency anemia, unspecified: Secondary | ICD-10-CM | POA: Diagnosis not present

## 2018-12-15 DIAGNOSIS — N186 End stage renal disease: Secondary | ICD-10-CM | POA: Diagnosis not present

## 2018-12-15 DIAGNOSIS — Z23 Encounter for immunization: Secondary | ICD-10-CM | POA: Diagnosis not present

## 2018-12-15 DIAGNOSIS — Z992 Dependence on renal dialysis: Secondary | ICD-10-CM | POA: Diagnosis not present

## 2018-12-15 DIAGNOSIS — D631 Anemia in chronic kidney disease: Secondary | ICD-10-CM | POA: Diagnosis not present

## 2018-12-17 DIAGNOSIS — D509 Iron deficiency anemia, unspecified: Secondary | ICD-10-CM | POA: Diagnosis not present

## 2018-12-17 DIAGNOSIS — N186 End stage renal disease: Secondary | ICD-10-CM | POA: Diagnosis not present

## 2018-12-17 DIAGNOSIS — Z23 Encounter for immunization: Secondary | ICD-10-CM | POA: Diagnosis not present

## 2018-12-17 DIAGNOSIS — N2581 Secondary hyperparathyroidism of renal origin: Secondary | ICD-10-CM | POA: Diagnosis not present

## 2018-12-17 DIAGNOSIS — Z992 Dependence on renal dialysis: Secondary | ICD-10-CM | POA: Diagnosis not present

## 2018-12-17 DIAGNOSIS — D631 Anemia in chronic kidney disease: Secondary | ICD-10-CM | POA: Diagnosis not present

## 2018-12-18 ENCOUNTER — Other Ambulatory Visit: Payer: Self-pay

## 2018-12-18 ENCOUNTER — Ambulatory Visit (INDEPENDENT_AMBULATORY_CARE_PROVIDER_SITE_OTHER): Payer: Medicare Other | Admitting: Family Medicine

## 2018-12-18 ENCOUNTER — Ambulatory Visit (INDEPENDENT_AMBULATORY_CARE_PROVIDER_SITE_OTHER): Payer: Medicare Other | Admitting: *Deleted

## 2018-12-18 DIAGNOSIS — S025XXA Fracture of tooth (traumatic), initial encounter for closed fracture: Secondary | ICD-10-CM | POA: Insufficient documentation

## 2018-12-18 DIAGNOSIS — I639 Cerebral infarction, unspecified: Secondary | ICD-10-CM

## 2018-12-18 LAB — CUP PACEART REMOTE DEVICE CHECK
Date Time Interrogation Session: 20201022142351
Implantable Pulse Generator Implant Date: 20180806

## 2018-12-18 MED ORDER — CLINDAMYCIN HCL 300 MG PO CAPS: 300.0000 mg | ORAL_CAPSULE | Freq: Two times a day (BID) | ORAL | 0 refills | Status: DC

## 2018-12-18 NOTE — Progress Notes (Signed)
Subjective:  Patient ID: [name] Marks  DOB: 1955-10-11 MRN: [id number]  [name] Marks is a 63 y.o. female with a PMH of h/o CVA, s/p AV replacement with bioprosthetic valve, pulmonary HTN, GERD, osteoporosis, ESRD on HD,, here today for infection.   HPI:  Broken tooth, infection: -Patient is ESRD with history of aortic valve replacement with bioprosthetic valve as well as history of endocarditis -She is coming in today after having a broken tooth.  Her front top tooth broke due to decay but she was unable to get to a dentist.  The break was last year sometime and she had an appointment scheduled at the beginning of the year, However this was canceled due to Covid.  She is coming in today because her face and gum started swelling a few days ago.  She denies any new fevers, congestions, shortness of breath, chest pain.  She denies any leg swelling.  She denies any new rashes.  Patient states that she normally takes antibiotics prior to going to the dentist.  She states she has not called the dentist to make an appointment as she wanted to be seen here in the office first.  ROS: As mentioned in HPI  Social hx: Denies use of illicit drugs, alcohol use Smoking status reviewed  Patient Active Problem List   Diagnosis Date Noted  . Tooth, broken 12/18/2018  . Hypotension 10/03/2018  . Orthostatic hypotension 03/14/2018  . S/P cholecystectomy 02/14/2017  . Osteoporosis 12/11/2016  . Cholelithiasis 11/07/2016  . Cerebral embolism with transient ischemic attack (TIA)   . AVD (aortic valve disease) 07/12/2016  . Pulmonary HTN ([place]) 07/12/2016  . Carpal tunnel syndrome 07/05/2016  . 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 CVA (cerebral vascular accident) ([place]) 05/16/2016  . History of aortic valve replacement with bioprosthetic valve 05/08/2016  . Bacteremia due to coagulase-negative Staphylococcus   . History of endocarditis   . Anemia   . Complex endometrial hyperplasia with atypia  11/08/2015  . Dependence on renal dialysis ([place]) 12/15/2014  . Obesity 10/28/2014  . Right carotid bruit 01/08/2013  . Generalized headaches 01/08/2013  . Chronic female pelvic pain 08/08/2012  . Gout, unspecified 12/06/2008  . Esophageal reflux 12/06/2008  . Backache 12/06/2008  . FIBROIDS, UTERUS 11/24/2008  . ESRD (end stage renal disease) on dialysis ([place]) 07/01/2006  . Anemia in chronic kidney disease 12/18/2001     Objective:  BP (!) 100/58   Pulse 76   Wt 165 lb (74.8 kg)   SpO2 98%   BMI 32.22 kg/m   Vitals and nursing note reviewed  General: NAD, pleasant Teeth: Multiple teeth missing as well as decay noted.  Patient with broken tooth on right top front tooth.  There is redness of the gum on the right side with some mild swelling. Mouth: Some swelling noted on upper lip and right cheek Extremities: no edema or cyanosis. WWP. Skin: warm and dry, no rashes noted Neuro: alert and oriented, no focal deficits Psych: normal affect, normal thought content  Assessment & Plan:   Tooth, broken Patient with a broken tooth for over a year now.  She now has face and gum swelling concerning for abscess and infection.  Patient is high risk with previous history of endocarditis as well as previous AV replacement with bioprosthetic valve in 2017.  Will give clindamycin 300 mg twice daily x10 days.  Patient highly encouraged to see a dentist ASAP.  She voiced understanding and states she will make an appointment  as soon as she gets home today, as she already has a Pharmacist, community.   [place] Jazz Rogala, DO Family Medicine Resident PGY-3

## 2018-12-18 NOTE — Assessment & Plan Note (Signed)
Patient with a broken tooth for over a year now.  She now has face and gum swelling concerning for abscess and infection.  Patient is high risk with previous history of endocarditis as well as previous AV replacement with bioprosthetic valve in 2017.  Will give clindamycin 300 mg twice daily x10 days.  Patient highly encouraged to see a dentist ASAP.  She voiced understanding and states she will make an appointment as soon as she gets home today, as she already has a Pharmacist, community.

## 2018-12-18 NOTE — Patient Instructions (Signed)
Thank you for coming to see me today. It was a pleasure! Today we talked about:   I have sent an antibiotic to your pharmacy called clindamycin.  Please take this twice daily for 10 days.  The most important treatment is for you to be seen by a dentist as soon as possible.  Please schedule an appointment with a dentist if possible within 72 hours, however within the week is also acceptable.  If you develop any fevers, chills or overall begin feeling unwell then do not hesitate to call our office or go to the emergency room.  Please follow-up as needed.  If you have any questions or concerns, please do not hesitate to call the office at [phone].  Take Care,   [name] Darlis Wragg, DO  Dental Abscess  A dental abscess is an area of pus in or around a tooth. It comes from an infection. It can cause pain and other symptoms. Treatment will help with symptoms and prevent the infection from spreading. Follow these instructions at home: Medicines  Take over-the-counter and prescription medicines only as told by your dentist.  If you were prescribed an antibiotic medicine, take it as told by your dentist. Do not stop taking it even if you start to feel better.  If you were prescribed a gel that has numbing medicine in it, use it exactly as told.  Do not drive or use heavy machinery (like a Conservation officer, nature) while taking prescription pain medicine. General instructions  Rinse out your mouth often with salt water. ? To make salt water, dissolve -1 tsp of salt in 1 cup of warm water.  Eat a soft diet while your mouth is healing.  Drink enough fluid to keep your urine pale yellow.  Do not apply heat to the outside of your mouth.  Do not use any products that contain nicotine or tobacco. These include cigarettes and e-cigarettes. If you need help quitting, ask your doctor.  Keep all follow-up visits as told by your dentist. This is important. Prevent an abscess  Brush your teeth every  morning and every night. Use fluoride toothpaste.  Floss your teeth each day.  Get dental cleanings as often as told by your dentist.  Think about getting dental sealant put on teeth that have deep holes (decay).  Drink water that has fluoride in it. ? Most tap water has fluoride. ? Check the label on bottled water to see if it has fluoride in it.  Drink water instead of sugary drinks.  Eat healthy meals and snacks.  Wear a mouth guard or face shield when you play sports. Contact a doctor if:  Your pain is worse, and medicine does not help. Get help right away if:  You have a fever or chills.  Your symptoms suddenly get worse.  You have a very bad headache.  You have problems breathing or swallowing.  You have trouble opening your mouth.  You have swelling in your neck or close to your eye. Summary  A dental abscess is an area of pus in or around a tooth. It is caused by an infection.  Treatment will help with symptoms and prevent the infection from spreading.  Take over-the-counter and prescription medicines only as told by your dentist.  To prevent an abscess, take good care of your teeth. Brush your teeth every morning and night. Use floss every day.  Get dental cleanings as often as told by your dentist. This information is not intended to replace advice  given to you by your health care provider. Make sure you discuss any questions you have with your health care provider. Document Released: 06/29/2014 Document Revised: 06/04/2018 Document Reviewed: 10/15/2016 Elsevier Patient Education  2020 [employer].

## 2018-12-19 DIAGNOSIS — D631 Anemia in chronic kidney disease: Secondary | ICD-10-CM | POA: Diagnosis not present

## 2018-12-19 DIAGNOSIS — D509 Iron deficiency anemia, unspecified: Secondary | ICD-10-CM | POA: Diagnosis not present

## 2018-12-19 DIAGNOSIS — N2581 Secondary hyperparathyroidism of renal origin: Secondary | ICD-10-CM | POA: Diagnosis not present

## 2018-12-19 DIAGNOSIS — N186 End stage renal disease: Secondary | ICD-10-CM | POA: Diagnosis not present

## 2018-12-19 DIAGNOSIS — Z992 Dependence on renal dialysis: Secondary | ICD-10-CM | POA: Diagnosis not present

## 2018-12-19 DIAGNOSIS — Z23 Encounter for immunization: Secondary | ICD-10-CM | POA: Diagnosis not present

## 2018-12-22 DIAGNOSIS — Z23 Encounter for immunization: Secondary | ICD-10-CM | POA: Diagnosis not present

## 2018-12-22 DIAGNOSIS — N186 End stage renal disease: Secondary | ICD-10-CM | POA: Diagnosis not present

## 2018-12-22 DIAGNOSIS — N2581 Secondary hyperparathyroidism of renal origin: Secondary | ICD-10-CM | POA: Diagnosis not present

## 2018-12-22 DIAGNOSIS — D509 Iron deficiency anemia, unspecified: Secondary | ICD-10-CM | POA: Diagnosis not present

## 2018-12-22 DIAGNOSIS — D631 Anemia in chronic kidney disease: Secondary | ICD-10-CM | POA: Diagnosis not present

## 2018-12-22 DIAGNOSIS — Z992 Dependence on renal dialysis: Secondary | ICD-10-CM | POA: Diagnosis not present

## 2018-12-24 DIAGNOSIS — Z992 Dependence on renal dialysis: Secondary | ICD-10-CM | POA: Diagnosis not present

## 2018-12-24 DIAGNOSIS — N2581 Secondary hyperparathyroidism of renal origin: Secondary | ICD-10-CM | POA: Diagnosis not present

## 2018-12-24 DIAGNOSIS — D509 Iron deficiency anemia, unspecified: Secondary | ICD-10-CM | POA: Diagnosis not present

## 2018-12-24 DIAGNOSIS — D631 Anemia in chronic kidney disease: Secondary | ICD-10-CM | POA: Diagnosis not present

## 2018-12-24 DIAGNOSIS — N186 End stage renal disease: Secondary | ICD-10-CM | POA: Diagnosis not present

## 2018-12-24 DIAGNOSIS — Z23 Encounter for immunization: Secondary | ICD-10-CM | POA: Diagnosis not present

## 2018-12-26 DIAGNOSIS — D631 Anemia in chronic kidney disease: Secondary | ICD-10-CM | POA: Diagnosis not present

## 2018-12-26 DIAGNOSIS — N186 End stage renal disease: Secondary | ICD-10-CM | POA: Diagnosis not present

## 2018-12-26 DIAGNOSIS — N2581 Secondary hyperparathyroidism of renal origin: Secondary | ICD-10-CM | POA: Diagnosis not present

## 2018-12-26 DIAGNOSIS — Z992 Dependence on renal dialysis: Secondary | ICD-10-CM | POA: Diagnosis not present

## 2018-12-26 DIAGNOSIS — D509 Iron deficiency anemia, unspecified: Secondary | ICD-10-CM | POA: Diagnosis not present

## 2018-12-26 DIAGNOSIS — Z23 Encounter for immunization: Secondary | ICD-10-CM | POA: Diagnosis not present

## 2018-12-28 DIAGNOSIS — N039 Chronic nephritic syndrome with unspecified morphologic changes: Secondary | ICD-10-CM | POA: Diagnosis not present

## 2018-12-28 DIAGNOSIS — N186 End stage renal disease: Secondary | ICD-10-CM | POA: Diagnosis not present

## 2018-12-28 DIAGNOSIS — Z992 Dependence on renal dialysis: Secondary | ICD-10-CM | POA: Diagnosis not present

## 2018-12-29 DIAGNOSIS — Z992 Dependence on renal dialysis: Secondary | ICD-10-CM | POA: Diagnosis not present

## 2018-12-29 DIAGNOSIS — N2581 Secondary hyperparathyroidism of renal origin: Secondary | ICD-10-CM | POA: Diagnosis not present

## 2018-12-29 DIAGNOSIS — N186 End stage renal disease: Secondary | ICD-10-CM | POA: Diagnosis not present

## 2018-12-29 DIAGNOSIS — D509 Iron deficiency anemia, unspecified: Secondary | ICD-10-CM | POA: Diagnosis not present

## 2018-12-29 DIAGNOSIS — D631 Anemia in chronic kidney disease: Secondary | ICD-10-CM | POA: Diagnosis not present

## 2018-12-30 NOTE — Progress Notes (Signed)
Carelink Summary Report / Loop Recorder 

## 2018-12-31 DIAGNOSIS — D631 Anemia in chronic kidney disease: Secondary | ICD-10-CM | POA: Diagnosis not present

## 2018-12-31 DIAGNOSIS — D509 Iron deficiency anemia, unspecified: Secondary | ICD-10-CM | POA: Diagnosis not present

## 2018-12-31 DIAGNOSIS — Z992 Dependence on renal dialysis: Secondary | ICD-10-CM | POA: Diagnosis not present

## 2018-12-31 DIAGNOSIS — N2581 Secondary hyperparathyroidism of renal origin: Secondary | ICD-10-CM | POA: Diagnosis not present

## 2018-12-31 DIAGNOSIS — N186 End stage renal disease: Secondary | ICD-10-CM | POA: Diagnosis not present

## 2019-01-02 DIAGNOSIS — N186 End stage renal disease: Secondary | ICD-10-CM | POA: Diagnosis not present

## 2019-01-02 DIAGNOSIS — D509 Iron deficiency anemia, unspecified: Secondary | ICD-10-CM | POA: Diagnosis not present

## 2019-01-02 DIAGNOSIS — N2581 Secondary hyperparathyroidism of renal origin: Secondary | ICD-10-CM | POA: Diagnosis not present

## 2019-01-02 DIAGNOSIS — Z992 Dependence on renal dialysis: Secondary | ICD-10-CM | POA: Diagnosis not present

## 2019-01-02 DIAGNOSIS — D631 Anemia in chronic kidney disease: Secondary | ICD-10-CM | POA: Diagnosis not present

## 2019-01-05 DIAGNOSIS — N2581 Secondary hyperparathyroidism of renal origin: Secondary | ICD-10-CM | POA: Diagnosis not present

## 2019-01-05 DIAGNOSIS — D509 Iron deficiency anemia, unspecified: Secondary | ICD-10-CM | POA: Diagnosis not present

## 2019-01-05 DIAGNOSIS — N186 End stage renal disease: Secondary | ICD-10-CM | POA: Diagnosis not present

## 2019-01-05 DIAGNOSIS — D631 Anemia in chronic kidney disease: Secondary | ICD-10-CM | POA: Diagnosis not present

## 2019-01-05 DIAGNOSIS — Z992 Dependence on renal dialysis: Secondary | ICD-10-CM | POA: Diagnosis not present

## 2019-01-07 DIAGNOSIS — N2581 Secondary hyperparathyroidism of renal origin: Secondary | ICD-10-CM | POA: Diagnosis not present

## 2019-01-07 DIAGNOSIS — D509 Iron deficiency anemia, unspecified: Secondary | ICD-10-CM | POA: Diagnosis not present

## 2019-01-07 DIAGNOSIS — N186 End stage renal disease: Secondary | ICD-10-CM | POA: Diagnosis not present

## 2019-01-07 DIAGNOSIS — Z992 Dependence on renal dialysis: Secondary | ICD-10-CM | POA: Diagnosis not present

## 2019-01-07 DIAGNOSIS — D631 Anemia in chronic kidney disease: Secondary | ICD-10-CM | POA: Diagnosis not present

## 2019-01-09 DIAGNOSIS — D631 Anemia in chronic kidney disease: Secondary | ICD-10-CM | POA: Diagnosis not present

## 2019-01-09 DIAGNOSIS — N2581 Secondary hyperparathyroidism of renal origin: Secondary | ICD-10-CM | POA: Diagnosis not present

## 2019-01-09 DIAGNOSIS — N186 End stage renal disease: Secondary | ICD-10-CM | POA: Diagnosis not present

## 2019-01-09 DIAGNOSIS — Z992 Dependence on renal dialysis: Secondary | ICD-10-CM | POA: Diagnosis not present

## 2019-01-09 DIAGNOSIS — D509 Iron deficiency anemia, unspecified: Secondary | ICD-10-CM | POA: Diagnosis not present

## 2019-01-12 DIAGNOSIS — D509 Iron deficiency anemia, unspecified: Secondary | ICD-10-CM | POA: Diagnosis not present

## 2019-01-12 DIAGNOSIS — N186 End stage renal disease: Secondary | ICD-10-CM | POA: Diagnosis not present

## 2019-01-12 DIAGNOSIS — N2581 Secondary hyperparathyroidism of renal origin: Secondary | ICD-10-CM | POA: Diagnosis not present

## 2019-01-12 DIAGNOSIS — D631 Anemia in chronic kidney disease: Secondary | ICD-10-CM | POA: Diagnosis not present

## 2019-01-12 DIAGNOSIS — Z992 Dependence on renal dialysis: Secondary | ICD-10-CM | POA: Diagnosis not present

## 2019-01-13 DIAGNOSIS — Z992 Dependence on renal dialysis: Secondary | ICD-10-CM | POA: Diagnosis not present

## 2019-01-13 DIAGNOSIS — N186 End stage renal disease: Secondary | ICD-10-CM | POA: Diagnosis not present

## 2019-01-13 DIAGNOSIS — T82858A Stenosis of vascular prosthetic devices, implants and grafts, initial encounter: Secondary | ICD-10-CM | POA: Diagnosis not present

## 2019-01-13 DIAGNOSIS — I871 Compression of vein: Secondary | ICD-10-CM | POA: Diagnosis not present

## 2019-01-14 DIAGNOSIS — Z992 Dependence on renal dialysis: Secondary | ICD-10-CM | POA: Diagnosis not present

## 2019-01-14 DIAGNOSIS — D509 Iron deficiency anemia, unspecified: Secondary | ICD-10-CM | POA: Diagnosis not present

## 2019-01-14 DIAGNOSIS — N186 End stage renal disease: Secondary | ICD-10-CM | POA: Diagnosis not present

## 2019-01-14 DIAGNOSIS — N2581 Secondary hyperparathyroidism of renal origin: Secondary | ICD-10-CM | POA: Diagnosis not present

## 2019-01-14 DIAGNOSIS — D631 Anemia in chronic kidney disease: Secondary | ICD-10-CM | POA: Diagnosis not present

## 2019-01-16 DIAGNOSIS — D631 Anemia in chronic kidney disease: Secondary | ICD-10-CM | POA: Diagnosis not present

## 2019-01-16 DIAGNOSIS — N2581 Secondary hyperparathyroidism of renal origin: Secondary | ICD-10-CM | POA: Diagnosis not present

## 2019-01-16 DIAGNOSIS — Z992 Dependence on renal dialysis: Secondary | ICD-10-CM | POA: Diagnosis not present

## 2019-01-16 DIAGNOSIS — N186 End stage renal disease: Secondary | ICD-10-CM | POA: Diagnosis not present

## 2019-01-16 DIAGNOSIS — D509 Iron deficiency anemia, unspecified: Secondary | ICD-10-CM | POA: Diagnosis not present

## 2019-01-18 DIAGNOSIS — D631 Anemia in chronic kidney disease: Secondary | ICD-10-CM | POA: Diagnosis not present

## 2019-01-18 DIAGNOSIS — N2581 Secondary hyperparathyroidism of renal origin: Secondary | ICD-10-CM | POA: Diagnosis not present

## 2019-01-18 DIAGNOSIS — N186 End stage renal disease: Secondary | ICD-10-CM | POA: Diagnosis not present

## 2019-01-18 DIAGNOSIS — Z992 Dependence on renal dialysis: Secondary | ICD-10-CM | POA: Diagnosis not present

## 2019-01-18 DIAGNOSIS — D509 Iron deficiency anemia, unspecified: Secondary | ICD-10-CM | POA: Diagnosis not present

## 2019-01-20 ENCOUNTER — Ambulatory Visit (INDEPENDENT_AMBULATORY_CARE_PROVIDER_SITE_OTHER): Payer: Medicare Other | Admitting: *Deleted

## 2019-01-20 DIAGNOSIS — I669 Occlusion and stenosis of unspecified cerebral artery: Secondary | ICD-10-CM | POA: Diagnosis not present

## 2019-01-20 DIAGNOSIS — D509 Iron deficiency anemia, unspecified: Secondary | ICD-10-CM | POA: Diagnosis not present

## 2019-01-20 DIAGNOSIS — N2581 Secondary hyperparathyroidism of renal origin: Secondary | ICD-10-CM | POA: Diagnosis not present

## 2019-01-20 DIAGNOSIS — N186 End stage renal disease: Secondary | ICD-10-CM | POA: Diagnosis not present

## 2019-01-20 DIAGNOSIS — D631 Anemia in chronic kidney disease: Secondary | ICD-10-CM | POA: Diagnosis not present

## 2019-01-20 DIAGNOSIS — Z992 Dependence on renal dialysis: Secondary | ICD-10-CM | POA: Diagnosis not present

## 2019-01-20 LAB — CUP PACEART REMOTE DEVICE CHECK
Date Time Interrogation Session: 20201124105843
Implantable Pulse Generator Implant Date: 20180806

## 2019-01-23 DIAGNOSIS — N2581 Secondary hyperparathyroidism of renal origin: Secondary | ICD-10-CM | POA: Diagnosis not present

## 2019-01-23 DIAGNOSIS — N186 End stage renal disease: Secondary | ICD-10-CM | POA: Diagnosis not present

## 2019-01-23 DIAGNOSIS — D631 Anemia in chronic kidney disease: Secondary | ICD-10-CM | POA: Diagnosis not present

## 2019-01-23 DIAGNOSIS — Z992 Dependence on renal dialysis: Secondary | ICD-10-CM | POA: Diagnosis not present

## 2019-01-23 DIAGNOSIS — D509 Iron deficiency anemia, unspecified: Secondary | ICD-10-CM | POA: Diagnosis not present

## 2019-01-26 DIAGNOSIS — Z992 Dependence on renal dialysis: Secondary | ICD-10-CM | POA: Diagnosis not present

## 2019-01-26 DIAGNOSIS — N2581 Secondary hyperparathyroidism of renal origin: Secondary | ICD-10-CM | POA: Diagnosis not present

## 2019-01-26 DIAGNOSIS — N186 End stage renal disease: Secondary | ICD-10-CM | POA: Diagnosis not present

## 2019-01-26 DIAGNOSIS — D631 Anemia in chronic kidney disease: Secondary | ICD-10-CM | POA: Diagnosis not present

## 2019-01-26 DIAGNOSIS — D509 Iron deficiency anemia, unspecified: Secondary | ICD-10-CM | POA: Diagnosis not present

## 2019-02-17 NOTE — Progress Notes (Signed)
ILR Remote 

## 2019-02-27 DIAGNOSIS — Z992 Dependence on renal dialysis: Secondary | ICD-10-CM | POA: Diagnosis not present

## 2019-02-27 DIAGNOSIS — N039 Chronic nephritic syndrome with unspecified morphologic changes: Secondary | ICD-10-CM | POA: Diagnosis not present

## 2019-02-27 DIAGNOSIS — N186 End stage renal disease: Secondary | ICD-10-CM | POA: Diagnosis not present

## 2019-02-28 DIAGNOSIS — D631 Anemia in chronic kidney disease: Secondary | ICD-10-CM | POA: Diagnosis not present

## 2019-02-28 DIAGNOSIS — N186 End stage renal disease: Secondary | ICD-10-CM | POA: Diagnosis not present

## 2019-02-28 DIAGNOSIS — N2581 Secondary hyperparathyroidism of renal origin: Secondary | ICD-10-CM | POA: Diagnosis not present

## 2019-02-28 DIAGNOSIS — Z992 Dependence on renal dialysis: Secondary | ICD-10-CM | POA: Diagnosis not present

## 2019-02-28 DIAGNOSIS — D509 Iron deficiency anemia, unspecified: Secondary | ICD-10-CM | POA: Diagnosis not present

## 2019-03-02 DIAGNOSIS — Z992 Dependence on renal dialysis: Secondary | ICD-10-CM | POA: Diagnosis not present

## 2019-03-02 DIAGNOSIS — N186 End stage renal disease: Secondary | ICD-10-CM | POA: Diagnosis not present

## 2019-03-02 DIAGNOSIS — N2581 Secondary hyperparathyroidism of renal origin: Secondary | ICD-10-CM | POA: Diagnosis not present

## 2019-03-02 DIAGNOSIS — D631 Anemia in chronic kidney disease: Secondary | ICD-10-CM | POA: Diagnosis not present

## 2019-03-02 DIAGNOSIS — D509 Iron deficiency anemia, unspecified: Secondary | ICD-10-CM | POA: Diagnosis not present

## 2019-03-04 DIAGNOSIS — D509 Iron deficiency anemia, unspecified: Secondary | ICD-10-CM | POA: Diagnosis not present

## 2019-03-04 DIAGNOSIS — N2581 Secondary hyperparathyroidism of renal origin: Secondary | ICD-10-CM | POA: Diagnosis not present

## 2019-03-04 DIAGNOSIS — Z992 Dependence on renal dialysis: Secondary | ICD-10-CM | POA: Diagnosis not present

## 2019-03-04 DIAGNOSIS — N186 End stage renal disease: Secondary | ICD-10-CM | POA: Diagnosis not present

## 2019-03-04 DIAGNOSIS — D631 Anemia in chronic kidney disease: Secondary | ICD-10-CM | POA: Diagnosis not present

## 2019-03-06 DIAGNOSIS — N186 End stage renal disease: Secondary | ICD-10-CM | POA: Diagnosis not present

## 2019-03-06 DIAGNOSIS — D509 Iron deficiency anemia, unspecified: Secondary | ICD-10-CM | POA: Diagnosis not present

## 2019-03-06 DIAGNOSIS — Z992 Dependence on renal dialysis: Secondary | ICD-10-CM | POA: Diagnosis not present

## 2019-03-06 DIAGNOSIS — N2581 Secondary hyperparathyroidism of renal origin: Secondary | ICD-10-CM | POA: Diagnosis not present

## 2019-03-06 DIAGNOSIS — D631 Anemia in chronic kidney disease: Secondary | ICD-10-CM | POA: Diagnosis not present

## 2019-03-09 DIAGNOSIS — D631 Anemia in chronic kidney disease: Secondary | ICD-10-CM | POA: Diagnosis not present

## 2019-03-09 DIAGNOSIS — Z992 Dependence on renal dialysis: Secondary | ICD-10-CM | POA: Diagnosis not present

## 2019-03-09 DIAGNOSIS — N186 End stage renal disease: Secondary | ICD-10-CM | POA: Diagnosis not present

## 2019-03-09 DIAGNOSIS — N2581 Secondary hyperparathyroidism of renal origin: Secondary | ICD-10-CM | POA: Diagnosis not present

## 2019-03-09 DIAGNOSIS — D509 Iron deficiency anemia, unspecified: Secondary | ICD-10-CM | POA: Diagnosis not present

## 2019-03-11 DIAGNOSIS — D509 Iron deficiency anemia, unspecified: Secondary | ICD-10-CM | POA: Diagnosis not present

## 2019-03-11 DIAGNOSIS — Z992 Dependence on renal dialysis: Secondary | ICD-10-CM | POA: Diagnosis not present

## 2019-03-11 DIAGNOSIS — N2581 Secondary hyperparathyroidism of renal origin: Secondary | ICD-10-CM | POA: Diagnosis not present

## 2019-03-11 DIAGNOSIS — D631 Anemia in chronic kidney disease: Secondary | ICD-10-CM | POA: Diagnosis not present

## 2019-03-11 DIAGNOSIS — N186 End stage renal disease: Secondary | ICD-10-CM | POA: Diagnosis not present

## 2019-03-13 DIAGNOSIS — D509 Iron deficiency anemia, unspecified: Secondary | ICD-10-CM | POA: Diagnosis not present

## 2019-03-13 DIAGNOSIS — D631 Anemia in chronic kidney disease: Secondary | ICD-10-CM | POA: Diagnosis not present

## 2019-03-13 DIAGNOSIS — N2581 Secondary hyperparathyroidism of renal origin: Secondary | ICD-10-CM | POA: Diagnosis not present

## 2019-03-13 DIAGNOSIS — N186 End stage renal disease: Secondary | ICD-10-CM | POA: Diagnosis not present

## 2019-03-13 DIAGNOSIS — Z992 Dependence on renal dialysis: Secondary | ICD-10-CM | POA: Diagnosis not present

## 2019-03-16 DIAGNOSIS — N186 End stage renal disease: Secondary | ICD-10-CM | POA: Diagnosis not present

## 2019-03-16 DIAGNOSIS — D509 Iron deficiency anemia, unspecified: Secondary | ICD-10-CM | POA: Diagnosis not present

## 2019-03-16 DIAGNOSIS — D631 Anemia in chronic kidney disease: Secondary | ICD-10-CM | POA: Diagnosis not present

## 2019-03-16 DIAGNOSIS — N2581 Secondary hyperparathyroidism of renal origin: Secondary | ICD-10-CM | POA: Diagnosis not present

## 2019-03-16 DIAGNOSIS — Z992 Dependence on renal dialysis: Secondary | ICD-10-CM | POA: Diagnosis not present

## 2019-03-18 DIAGNOSIS — D509 Iron deficiency anemia, unspecified: Secondary | ICD-10-CM | POA: Diagnosis not present

## 2019-03-18 DIAGNOSIS — N186 End stage renal disease: Secondary | ICD-10-CM | POA: Diagnosis not present

## 2019-03-18 DIAGNOSIS — D631 Anemia in chronic kidney disease: Secondary | ICD-10-CM | POA: Diagnosis not present

## 2019-03-18 DIAGNOSIS — N2581 Secondary hyperparathyroidism of renal origin: Secondary | ICD-10-CM | POA: Diagnosis not present

## 2019-03-18 DIAGNOSIS — Z992 Dependence on renal dialysis: Secondary | ICD-10-CM | POA: Diagnosis not present

## 2019-03-20 DIAGNOSIS — N2581 Secondary hyperparathyroidism of renal origin: Secondary | ICD-10-CM | POA: Diagnosis not present

## 2019-03-20 DIAGNOSIS — D631 Anemia in chronic kidney disease: Secondary | ICD-10-CM | POA: Diagnosis not present

## 2019-03-20 DIAGNOSIS — N186 End stage renal disease: Secondary | ICD-10-CM | POA: Diagnosis not present

## 2019-03-20 DIAGNOSIS — D509 Iron deficiency anemia, unspecified: Secondary | ICD-10-CM | POA: Diagnosis not present

## 2019-03-20 DIAGNOSIS — Z992 Dependence on renal dialysis: Secondary | ICD-10-CM | POA: Diagnosis not present

## 2019-03-23 DIAGNOSIS — N186 End stage renal disease: Secondary | ICD-10-CM | POA: Diagnosis not present

## 2019-03-23 DIAGNOSIS — N2581 Secondary hyperparathyroidism of renal origin: Secondary | ICD-10-CM | POA: Diagnosis not present

## 2019-03-23 DIAGNOSIS — D509 Iron deficiency anemia, unspecified: Secondary | ICD-10-CM | POA: Diagnosis not present

## 2019-03-23 DIAGNOSIS — D631 Anemia in chronic kidney disease: Secondary | ICD-10-CM | POA: Diagnosis not present

## 2019-03-23 DIAGNOSIS — Z992 Dependence on renal dialysis: Secondary | ICD-10-CM | POA: Diagnosis not present

## 2019-03-25 DIAGNOSIS — Z992 Dependence on renal dialysis: Secondary | ICD-10-CM | POA: Diagnosis not present

## 2019-03-25 DIAGNOSIS — N186 End stage renal disease: Secondary | ICD-10-CM | POA: Diagnosis not present

## 2019-03-25 DIAGNOSIS — D631 Anemia in chronic kidney disease: Secondary | ICD-10-CM | POA: Diagnosis not present

## 2019-03-25 DIAGNOSIS — N2581 Secondary hyperparathyroidism of renal origin: Secondary | ICD-10-CM | POA: Diagnosis not present

## 2019-03-25 DIAGNOSIS — D509 Iron deficiency anemia, unspecified: Secondary | ICD-10-CM | POA: Diagnosis not present

## 2019-03-26 ENCOUNTER — Ambulatory Visit (INDEPENDENT_AMBULATORY_CARE_PROVIDER_SITE_OTHER): Payer: Medicare Other | Admitting: *Deleted

## 2019-03-26 DIAGNOSIS — I669 Occlusion and stenosis of unspecified cerebral artery: Secondary | ICD-10-CM

## 2019-03-27 DIAGNOSIS — Z992 Dependence on renal dialysis: Secondary | ICD-10-CM | POA: Diagnosis not present

## 2019-03-27 DIAGNOSIS — D631 Anemia in chronic kidney disease: Secondary | ICD-10-CM | POA: Diagnosis not present

## 2019-03-27 DIAGNOSIS — D509 Iron deficiency anemia, unspecified: Secondary | ICD-10-CM | POA: Diagnosis not present

## 2019-03-27 DIAGNOSIS — N2581 Secondary hyperparathyroidism of renal origin: Secondary | ICD-10-CM | POA: Diagnosis not present

## 2019-03-27 DIAGNOSIS — N186 End stage renal disease: Secondary | ICD-10-CM | POA: Diagnosis not present

## 2019-03-27 LAB — CUP PACEART REMOTE DEVICE CHECK
Date Time Interrogation Session: 20210129110245
Implantable Pulse Generator Implant Date: 20180806

## 2019-03-30 DIAGNOSIS — D631 Anemia in chronic kidney disease: Secondary | ICD-10-CM | POA: Diagnosis not present

## 2019-03-30 DIAGNOSIS — Z992 Dependence on renal dialysis: Secondary | ICD-10-CM | POA: Diagnosis not present

## 2019-03-30 DIAGNOSIS — N186 End stage renal disease: Secondary | ICD-10-CM | POA: Diagnosis not present

## 2019-03-30 DIAGNOSIS — N2581 Secondary hyperparathyroidism of renal origin: Secondary | ICD-10-CM | POA: Diagnosis not present

## 2019-03-30 DIAGNOSIS — D509 Iron deficiency anemia, unspecified: Secondary | ICD-10-CM | POA: Diagnosis not present

## 2019-03-30 DIAGNOSIS — N039 Chronic nephritic syndrome with unspecified morphologic changes: Secondary | ICD-10-CM | POA: Diagnosis not present

## 2019-04-01 DIAGNOSIS — N2581 Secondary hyperparathyroidism of renal origin: Secondary | ICD-10-CM | POA: Diagnosis not present

## 2019-04-01 DIAGNOSIS — N186 End stage renal disease: Secondary | ICD-10-CM | POA: Diagnosis not present

## 2019-04-01 DIAGNOSIS — D631 Anemia in chronic kidney disease: Secondary | ICD-10-CM | POA: Diagnosis not present

## 2019-04-01 DIAGNOSIS — Z992 Dependence on renal dialysis: Secondary | ICD-10-CM | POA: Diagnosis not present

## 2019-04-01 DIAGNOSIS — D509 Iron deficiency anemia, unspecified: Secondary | ICD-10-CM | POA: Diagnosis not present

## 2019-04-03 DIAGNOSIS — D631 Anemia in chronic kidney disease: Secondary | ICD-10-CM | POA: Diagnosis not present

## 2019-04-03 DIAGNOSIS — N2581 Secondary hyperparathyroidism of renal origin: Secondary | ICD-10-CM | POA: Diagnosis not present

## 2019-04-03 DIAGNOSIS — D509 Iron deficiency anemia, unspecified: Secondary | ICD-10-CM | POA: Diagnosis not present

## 2019-04-03 DIAGNOSIS — Z992 Dependence on renal dialysis: Secondary | ICD-10-CM | POA: Diagnosis not present

## 2019-04-03 DIAGNOSIS — N186 End stage renal disease: Secondary | ICD-10-CM | POA: Diagnosis not present

## 2019-04-06 DIAGNOSIS — Z992 Dependence on renal dialysis: Secondary | ICD-10-CM | POA: Diagnosis not present

## 2019-04-06 DIAGNOSIS — D631 Anemia in chronic kidney disease: Secondary | ICD-10-CM | POA: Diagnosis not present

## 2019-04-06 DIAGNOSIS — D509 Iron deficiency anemia, unspecified: Secondary | ICD-10-CM | POA: Diagnosis not present

## 2019-04-06 DIAGNOSIS — N186 End stage renal disease: Secondary | ICD-10-CM | POA: Diagnosis not present

## 2019-04-06 DIAGNOSIS — N2581 Secondary hyperparathyroidism of renal origin: Secondary | ICD-10-CM | POA: Diagnosis not present

## 2019-04-08 DIAGNOSIS — D631 Anemia in chronic kidney disease: Secondary | ICD-10-CM | POA: Diagnosis not present

## 2019-04-08 DIAGNOSIS — N2581 Secondary hyperparathyroidism of renal origin: Secondary | ICD-10-CM | POA: Diagnosis not present

## 2019-04-08 DIAGNOSIS — N186 End stage renal disease: Secondary | ICD-10-CM | POA: Diagnosis not present

## 2019-04-08 DIAGNOSIS — Z992 Dependence on renal dialysis: Secondary | ICD-10-CM | POA: Diagnosis not present

## 2019-04-08 DIAGNOSIS — D509 Iron deficiency anemia, unspecified: Secondary | ICD-10-CM | POA: Diagnosis not present

## 2019-04-10 DIAGNOSIS — D631 Anemia in chronic kidney disease: Secondary | ICD-10-CM | POA: Diagnosis not present

## 2019-04-10 DIAGNOSIS — Z992 Dependence on renal dialysis: Secondary | ICD-10-CM | POA: Diagnosis not present

## 2019-04-10 DIAGNOSIS — N2581 Secondary hyperparathyroidism of renal origin: Secondary | ICD-10-CM | POA: Diagnosis not present

## 2019-04-10 DIAGNOSIS — N186 End stage renal disease: Secondary | ICD-10-CM | POA: Diagnosis not present

## 2019-04-10 DIAGNOSIS — D509 Iron deficiency anemia, unspecified: Secondary | ICD-10-CM | POA: Diagnosis not present

## 2019-04-13 DIAGNOSIS — Z992 Dependence on renal dialysis: Secondary | ICD-10-CM | POA: Diagnosis not present

## 2019-04-13 DIAGNOSIS — D631 Anemia in chronic kidney disease: Secondary | ICD-10-CM | POA: Diagnosis not present

## 2019-04-13 DIAGNOSIS — D509 Iron deficiency anemia, unspecified: Secondary | ICD-10-CM | POA: Diagnosis not present

## 2019-04-13 DIAGNOSIS — N186 End stage renal disease: Secondary | ICD-10-CM | POA: Diagnosis not present

## 2019-04-13 DIAGNOSIS — N2581 Secondary hyperparathyroidism of renal origin: Secondary | ICD-10-CM | POA: Diagnosis not present

## 2019-04-15 DIAGNOSIS — N2581 Secondary hyperparathyroidism of renal origin: Secondary | ICD-10-CM | POA: Diagnosis not present

## 2019-04-15 DIAGNOSIS — D509 Iron deficiency anemia, unspecified: Secondary | ICD-10-CM | POA: Diagnosis not present

## 2019-04-15 DIAGNOSIS — Z992 Dependence on renal dialysis: Secondary | ICD-10-CM | POA: Diagnosis not present

## 2019-04-15 DIAGNOSIS — N186 End stage renal disease: Secondary | ICD-10-CM | POA: Diagnosis not present

## 2019-04-15 DIAGNOSIS — D631 Anemia in chronic kidney disease: Secondary | ICD-10-CM | POA: Diagnosis not present

## 2019-04-17 DIAGNOSIS — D631 Anemia in chronic kidney disease: Secondary | ICD-10-CM | POA: Diagnosis not present

## 2019-04-17 DIAGNOSIS — N2581 Secondary hyperparathyroidism of renal origin: Secondary | ICD-10-CM | POA: Diagnosis not present

## 2019-04-17 DIAGNOSIS — Z992 Dependence on renal dialysis: Secondary | ICD-10-CM | POA: Diagnosis not present

## 2019-04-17 DIAGNOSIS — D509 Iron deficiency anemia, unspecified: Secondary | ICD-10-CM | POA: Diagnosis not present

## 2019-04-17 DIAGNOSIS — N186 End stage renal disease: Secondary | ICD-10-CM | POA: Diagnosis not present

## 2019-04-20 DIAGNOSIS — N2581 Secondary hyperparathyroidism of renal origin: Secondary | ICD-10-CM | POA: Diagnosis not present

## 2019-04-20 DIAGNOSIS — D631 Anemia in chronic kidney disease: Secondary | ICD-10-CM | POA: Diagnosis not present

## 2019-04-20 DIAGNOSIS — Z992 Dependence on renal dialysis: Secondary | ICD-10-CM | POA: Diagnosis not present

## 2019-04-20 DIAGNOSIS — N186 End stage renal disease: Secondary | ICD-10-CM | POA: Diagnosis not present

## 2019-04-20 DIAGNOSIS — D509 Iron deficiency anemia, unspecified: Secondary | ICD-10-CM | POA: Diagnosis not present

## 2019-04-22 DIAGNOSIS — N2581 Secondary hyperparathyroidism of renal origin: Secondary | ICD-10-CM | POA: Diagnosis not present

## 2019-04-22 DIAGNOSIS — D509 Iron deficiency anemia, unspecified: Secondary | ICD-10-CM | POA: Diagnosis not present

## 2019-04-22 DIAGNOSIS — D631 Anemia in chronic kidney disease: Secondary | ICD-10-CM | POA: Diagnosis not present

## 2019-04-22 DIAGNOSIS — Z992 Dependence on renal dialysis: Secondary | ICD-10-CM | POA: Diagnosis not present

## 2019-04-22 DIAGNOSIS — N186 End stage renal disease: Secondary | ICD-10-CM | POA: Diagnosis not present

## 2019-04-24 DIAGNOSIS — D631 Anemia in chronic kidney disease: Secondary | ICD-10-CM | POA: Diagnosis not present

## 2019-04-24 DIAGNOSIS — N186 End stage renal disease: Secondary | ICD-10-CM | POA: Diagnosis not present

## 2019-04-24 DIAGNOSIS — D509 Iron deficiency anemia, unspecified: Secondary | ICD-10-CM | POA: Diagnosis not present

## 2019-04-24 DIAGNOSIS — N2581 Secondary hyperparathyroidism of renal origin: Secondary | ICD-10-CM | POA: Diagnosis not present

## 2019-04-24 DIAGNOSIS — Z992 Dependence on renal dialysis: Secondary | ICD-10-CM | POA: Diagnosis not present

## 2019-04-26 ENCOUNTER — Ambulatory Visit (INDEPENDENT_AMBULATORY_CARE_PROVIDER_SITE_OTHER): Payer: Medicare Other | Admitting: *Deleted

## 2019-04-26 DIAGNOSIS — I669 Occlusion and stenosis of unspecified cerebral artery: Secondary | ICD-10-CM

## 2019-04-26 LAB — CUP PACEART REMOTE DEVICE CHECK
Date Time Interrogation Session: 20210228000506
Implantable Pulse Generator Implant Date: 20180806

## 2019-04-26 NOTE — Progress Notes (Signed)
ILR remote 

## 2019-04-27 DIAGNOSIS — N2581 Secondary hyperparathyroidism of renal origin: Secondary | ICD-10-CM | POA: Diagnosis not present

## 2019-04-27 DIAGNOSIS — Z992 Dependence on renal dialysis: Secondary | ICD-10-CM | POA: Diagnosis not present

## 2019-04-27 DIAGNOSIS — N186 End stage renal disease: Secondary | ICD-10-CM | POA: Diagnosis not present

## 2019-04-27 DIAGNOSIS — N039 Chronic nephritic syndrome with unspecified morphologic changes: Secondary | ICD-10-CM | POA: Diagnosis not present

## 2019-04-27 DIAGNOSIS — D509 Iron deficiency anemia, unspecified: Secondary | ICD-10-CM | POA: Diagnosis not present

## 2019-04-27 DIAGNOSIS — D631 Anemia in chronic kidney disease: Secondary | ICD-10-CM | POA: Diagnosis not present

## 2019-04-29 DIAGNOSIS — D509 Iron deficiency anemia, unspecified: Secondary | ICD-10-CM | POA: Diagnosis not present

## 2019-04-29 DIAGNOSIS — D631 Anemia in chronic kidney disease: Secondary | ICD-10-CM | POA: Diagnosis not present

## 2019-04-29 DIAGNOSIS — Z992 Dependence on renal dialysis: Secondary | ICD-10-CM | POA: Diagnosis not present

## 2019-04-29 DIAGNOSIS — N2581 Secondary hyperparathyroidism of renal origin: Secondary | ICD-10-CM | POA: Diagnosis not present

## 2019-04-29 DIAGNOSIS — N186 End stage renal disease: Secondary | ICD-10-CM | POA: Diagnosis not present

## 2019-05-01 DIAGNOSIS — D631 Anemia in chronic kidney disease: Secondary | ICD-10-CM | POA: Diagnosis not present

## 2019-05-01 DIAGNOSIS — N186 End stage renal disease: Secondary | ICD-10-CM | POA: Diagnosis not present

## 2019-05-01 DIAGNOSIS — N2581 Secondary hyperparathyroidism of renal origin: Secondary | ICD-10-CM | POA: Diagnosis not present

## 2019-05-01 DIAGNOSIS — Z992 Dependence on renal dialysis: Secondary | ICD-10-CM | POA: Diagnosis not present

## 2019-05-01 DIAGNOSIS — D509 Iron deficiency anemia, unspecified: Secondary | ICD-10-CM | POA: Diagnosis not present

## 2019-05-04 DIAGNOSIS — N2581 Secondary hyperparathyroidism of renal origin: Secondary | ICD-10-CM | POA: Diagnosis not present

## 2019-05-04 DIAGNOSIS — Z992 Dependence on renal dialysis: Secondary | ICD-10-CM | POA: Diagnosis not present

## 2019-05-04 DIAGNOSIS — N186 End stage renal disease: Secondary | ICD-10-CM | POA: Diagnosis not present

## 2019-05-04 DIAGNOSIS — D509 Iron deficiency anemia, unspecified: Secondary | ICD-10-CM | POA: Diagnosis not present

## 2019-05-04 DIAGNOSIS — D631 Anemia in chronic kidney disease: Secondary | ICD-10-CM | POA: Diagnosis not present

## 2019-05-06 DIAGNOSIS — D631 Anemia in chronic kidney disease: Secondary | ICD-10-CM | POA: Diagnosis not present

## 2019-05-06 DIAGNOSIS — D509 Iron deficiency anemia, unspecified: Secondary | ICD-10-CM | POA: Diagnosis not present

## 2019-05-06 DIAGNOSIS — N2581 Secondary hyperparathyroidism of renal origin: Secondary | ICD-10-CM | POA: Diagnosis not present

## 2019-05-06 DIAGNOSIS — N186 End stage renal disease: Secondary | ICD-10-CM | POA: Diagnosis not present

## 2019-05-06 DIAGNOSIS — Z992 Dependence on renal dialysis: Secondary | ICD-10-CM | POA: Diagnosis not present

## 2019-05-08 DIAGNOSIS — N186 End stage renal disease: Secondary | ICD-10-CM | POA: Diagnosis not present

## 2019-05-08 DIAGNOSIS — D631 Anemia in chronic kidney disease: Secondary | ICD-10-CM | POA: Diagnosis not present

## 2019-05-08 DIAGNOSIS — D509 Iron deficiency anemia, unspecified: Secondary | ICD-10-CM | POA: Diagnosis not present

## 2019-05-08 DIAGNOSIS — Z992 Dependence on renal dialysis: Secondary | ICD-10-CM | POA: Diagnosis not present

## 2019-05-08 DIAGNOSIS — N2581 Secondary hyperparathyroidism of renal origin: Secondary | ICD-10-CM | POA: Diagnosis not present

## 2019-05-11 DIAGNOSIS — D509 Iron deficiency anemia, unspecified: Secondary | ICD-10-CM | POA: Diagnosis not present

## 2019-05-11 DIAGNOSIS — N2581 Secondary hyperparathyroidism of renal origin: Secondary | ICD-10-CM | POA: Diagnosis not present

## 2019-05-11 DIAGNOSIS — D631 Anemia in chronic kidney disease: Secondary | ICD-10-CM | POA: Diagnosis not present

## 2019-05-11 DIAGNOSIS — Z992 Dependence on renal dialysis: Secondary | ICD-10-CM | POA: Diagnosis not present

## 2019-05-11 DIAGNOSIS — N186 End stage renal disease: Secondary | ICD-10-CM | POA: Diagnosis not present

## 2019-05-13 DIAGNOSIS — D631 Anemia in chronic kidney disease: Secondary | ICD-10-CM | POA: Diagnosis not present

## 2019-05-13 DIAGNOSIS — N2581 Secondary hyperparathyroidism of renal origin: Secondary | ICD-10-CM | POA: Diagnosis not present

## 2019-05-13 DIAGNOSIS — N186 End stage renal disease: Secondary | ICD-10-CM | POA: Diagnosis not present

## 2019-05-13 DIAGNOSIS — Z992 Dependence on renal dialysis: Secondary | ICD-10-CM | POA: Diagnosis not present

## 2019-05-13 DIAGNOSIS — D509 Iron deficiency anemia, unspecified: Secondary | ICD-10-CM | POA: Diagnosis not present

## 2019-05-15 DIAGNOSIS — D509 Iron deficiency anemia, unspecified: Secondary | ICD-10-CM | POA: Diagnosis not present

## 2019-05-15 DIAGNOSIS — N186 End stage renal disease: Secondary | ICD-10-CM | POA: Diagnosis not present

## 2019-05-15 DIAGNOSIS — D631 Anemia in chronic kidney disease: Secondary | ICD-10-CM | POA: Diagnosis not present

## 2019-05-15 DIAGNOSIS — N2581 Secondary hyperparathyroidism of renal origin: Secondary | ICD-10-CM | POA: Diagnosis not present

## 2019-05-15 DIAGNOSIS — Z992 Dependence on renal dialysis: Secondary | ICD-10-CM | POA: Diagnosis not present

## 2019-05-18 DIAGNOSIS — N2581 Secondary hyperparathyroidism of renal origin: Secondary | ICD-10-CM | POA: Diagnosis not present

## 2019-05-18 DIAGNOSIS — N186 End stage renal disease: Secondary | ICD-10-CM | POA: Diagnosis not present

## 2019-05-18 DIAGNOSIS — D509 Iron deficiency anemia, unspecified: Secondary | ICD-10-CM | POA: Diagnosis not present

## 2019-05-18 DIAGNOSIS — Z992 Dependence on renal dialysis: Secondary | ICD-10-CM | POA: Diagnosis not present

## 2019-05-18 DIAGNOSIS — D631 Anemia in chronic kidney disease: Secondary | ICD-10-CM | POA: Diagnosis not present

## 2019-05-20 DIAGNOSIS — N2581 Secondary hyperparathyroidism of renal origin: Secondary | ICD-10-CM | POA: Diagnosis not present

## 2019-05-20 DIAGNOSIS — D631 Anemia in chronic kidney disease: Secondary | ICD-10-CM | POA: Diagnosis not present

## 2019-05-20 DIAGNOSIS — D509 Iron deficiency anemia, unspecified: Secondary | ICD-10-CM | POA: Diagnosis not present

## 2019-05-20 DIAGNOSIS — N186 End stage renal disease: Secondary | ICD-10-CM | POA: Diagnosis not present

## 2019-05-20 DIAGNOSIS — Z992 Dependence on renal dialysis: Secondary | ICD-10-CM | POA: Diagnosis not present

## 2019-05-22 DIAGNOSIS — N186 End stage renal disease: Secondary | ICD-10-CM | POA: Diagnosis not present

## 2019-05-22 DIAGNOSIS — D631 Anemia in chronic kidney disease: Secondary | ICD-10-CM | POA: Diagnosis not present

## 2019-05-22 DIAGNOSIS — N2581 Secondary hyperparathyroidism of renal origin: Secondary | ICD-10-CM | POA: Diagnosis not present

## 2019-05-22 DIAGNOSIS — Z992 Dependence on renal dialysis: Secondary | ICD-10-CM | POA: Diagnosis not present

## 2019-05-22 DIAGNOSIS — D509 Iron deficiency anemia, unspecified: Secondary | ICD-10-CM | POA: Diagnosis not present

## 2019-05-25 DIAGNOSIS — N186 End stage renal disease: Secondary | ICD-10-CM | POA: Diagnosis not present

## 2019-05-25 DIAGNOSIS — D631 Anemia in chronic kidney disease: Secondary | ICD-10-CM | POA: Diagnosis not present

## 2019-05-25 DIAGNOSIS — Z992 Dependence on renal dialysis: Secondary | ICD-10-CM | POA: Diagnosis not present

## 2019-05-25 DIAGNOSIS — N2581 Secondary hyperparathyroidism of renal origin: Secondary | ICD-10-CM | POA: Diagnosis not present

## 2019-05-25 DIAGNOSIS — D509 Iron deficiency anemia, unspecified: Secondary | ICD-10-CM | POA: Diagnosis not present

## 2019-05-27 ENCOUNTER — Ambulatory Visit (INDEPENDENT_AMBULATORY_CARE_PROVIDER_SITE_OTHER): Payer: Medicare Other | Admitting: *Deleted

## 2019-05-27 DIAGNOSIS — D509 Iron deficiency anemia, unspecified: Secondary | ICD-10-CM | POA: Diagnosis not present

## 2019-05-27 DIAGNOSIS — I669 Occlusion and stenosis of unspecified cerebral artery: Secondary | ICD-10-CM | POA: Diagnosis not present

## 2019-05-27 DIAGNOSIS — Z992 Dependence on renal dialysis: Secondary | ICD-10-CM | POA: Diagnosis not present

## 2019-05-27 DIAGNOSIS — N2581 Secondary hyperparathyroidism of renal origin: Secondary | ICD-10-CM | POA: Diagnosis not present

## 2019-05-27 DIAGNOSIS — D631 Anemia in chronic kidney disease: Secondary | ICD-10-CM | POA: Diagnosis not present

## 2019-05-27 DIAGNOSIS — N186 End stage renal disease: Secondary | ICD-10-CM | POA: Diagnosis not present

## 2019-05-27 LAB — CUP PACEART REMOTE DEVICE CHECK
Date Time Interrogation Session: 20210331010641
Implantable Pulse Generator Implant Date: 20180806

## 2019-05-27 NOTE — Progress Notes (Signed)
ILR Remote 

## 2019-05-28 DIAGNOSIS — N186 End stage renal disease: Secondary | ICD-10-CM | POA: Diagnosis not present

## 2019-05-28 DIAGNOSIS — Z992 Dependence on renal dialysis: Secondary | ICD-10-CM | POA: Diagnosis not present

## 2019-05-28 DIAGNOSIS — N039 Chronic nephritic syndrome with unspecified morphologic changes: Secondary | ICD-10-CM | POA: Diagnosis not present

## 2019-05-29 DIAGNOSIS — D631 Anemia in chronic kidney disease: Secondary | ICD-10-CM | POA: Diagnosis not present

## 2019-05-29 DIAGNOSIS — N186 End stage renal disease: Secondary | ICD-10-CM | POA: Diagnosis not present

## 2019-05-29 DIAGNOSIS — N2581 Secondary hyperparathyroidism of renal origin: Secondary | ICD-10-CM | POA: Diagnosis not present

## 2019-05-29 DIAGNOSIS — D509 Iron deficiency anemia, unspecified: Secondary | ICD-10-CM | POA: Diagnosis not present

## 2019-05-29 DIAGNOSIS — Z992 Dependence on renal dialysis: Secondary | ICD-10-CM | POA: Diagnosis not present

## 2019-06-01 DIAGNOSIS — N186 End stage renal disease: Secondary | ICD-10-CM | POA: Diagnosis not present

## 2019-06-01 DIAGNOSIS — D631 Anemia in chronic kidney disease: Secondary | ICD-10-CM | POA: Diagnosis not present

## 2019-06-01 DIAGNOSIS — N2581 Secondary hyperparathyroidism of renal origin: Secondary | ICD-10-CM | POA: Diagnosis not present

## 2019-06-01 DIAGNOSIS — D509 Iron deficiency anemia, unspecified: Secondary | ICD-10-CM | POA: Diagnosis not present

## 2019-06-01 DIAGNOSIS — Z992 Dependence on renal dialysis: Secondary | ICD-10-CM | POA: Diagnosis not present

## 2019-06-03 DIAGNOSIS — N186 End stage renal disease: Secondary | ICD-10-CM | POA: Diagnosis not present

## 2019-06-03 DIAGNOSIS — N2581 Secondary hyperparathyroidism of renal origin: Secondary | ICD-10-CM | POA: Diagnosis not present

## 2019-06-03 DIAGNOSIS — Z992 Dependence on renal dialysis: Secondary | ICD-10-CM | POA: Diagnosis not present

## 2019-06-03 DIAGNOSIS — D509 Iron deficiency anemia, unspecified: Secondary | ICD-10-CM | POA: Diagnosis not present

## 2019-06-03 DIAGNOSIS — D631 Anemia in chronic kidney disease: Secondary | ICD-10-CM | POA: Diagnosis not present

## 2019-06-05 DIAGNOSIS — N2581 Secondary hyperparathyroidism of renal origin: Secondary | ICD-10-CM | POA: Diagnosis not present

## 2019-06-05 DIAGNOSIS — D509 Iron deficiency anemia, unspecified: Secondary | ICD-10-CM | POA: Diagnosis not present

## 2019-06-05 DIAGNOSIS — Z992 Dependence on renal dialysis: Secondary | ICD-10-CM | POA: Diagnosis not present

## 2019-06-05 DIAGNOSIS — D631 Anemia in chronic kidney disease: Secondary | ICD-10-CM | POA: Diagnosis not present

## 2019-06-05 DIAGNOSIS — N186 End stage renal disease: Secondary | ICD-10-CM | POA: Diagnosis not present

## 2019-06-08 DIAGNOSIS — D509 Iron deficiency anemia, unspecified: Secondary | ICD-10-CM | POA: Diagnosis not present

## 2019-06-08 DIAGNOSIS — D631 Anemia in chronic kidney disease: Secondary | ICD-10-CM | POA: Diagnosis not present

## 2019-06-08 DIAGNOSIS — N186 End stage renal disease: Secondary | ICD-10-CM | POA: Diagnosis not present

## 2019-06-08 DIAGNOSIS — Z992 Dependence on renal dialysis: Secondary | ICD-10-CM | POA: Diagnosis not present

## 2019-06-08 DIAGNOSIS — N2581 Secondary hyperparathyroidism of renal origin: Secondary | ICD-10-CM | POA: Diagnosis not present

## 2019-06-10 DIAGNOSIS — D509 Iron deficiency anemia, unspecified: Secondary | ICD-10-CM | POA: Diagnosis not present

## 2019-06-10 DIAGNOSIS — D631 Anemia in chronic kidney disease: Secondary | ICD-10-CM | POA: Diagnosis not present

## 2019-06-10 DIAGNOSIS — N186 End stage renal disease: Secondary | ICD-10-CM | POA: Diagnosis not present

## 2019-06-10 DIAGNOSIS — Z992 Dependence on renal dialysis: Secondary | ICD-10-CM | POA: Diagnosis not present

## 2019-06-10 DIAGNOSIS — N2581 Secondary hyperparathyroidism of renal origin: Secondary | ICD-10-CM | POA: Diagnosis not present

## 2019-06-12 DIAGNOSIS — D509 Iron deficiency anemia, unspecified: Secondary | ICD-10-CM | POA: Diagnosis not present

## 2019-06-12 DIAGNOSIS — N2581 Secondary hyperparathyroidism of renal origin: Secondary | ICD-10-CM | POA: Diagnosis not present

## 2019-06-12 DIAGNOSIS — N186 End stage renal disease: Secondary | ICD-10-CM | POA: Diagnosis not present

## 2019-06-12 DIAGNOSIS — D631 Anemia in chronic kidney disease: Secondary | ICD-10-CM | POA: Diagnosis not present

## 2019-06-12 DIAGNOSIS — Z992 Dependence on renal dialysis: Secondary | ICD-10-CM | POA: Diagnosis not present

## 2019-06-15 DIAGNOSIS — N186 End stage renal disease: Secondary | ICD-10-CM | POA: Diagnosis not present

## 2019-06-15 DIAGNOSIS — Z992 Dependence on renal dialysis: Secondary | ICD-10-CM | POA: Diagnosis not present

## 2019-06-15 DIAGNOSIS — D631 Anemia in chronic kidney disease: Secondary | ICD-10-CM | POA: Diagnosis not present

## 2019-06-15 DIAGNOSIS — N2581 Secondary hyperparathyroidism of renal origin: Secondary | ICD-10-CM | POA: Diagnosis not present

## 2019-06-15 DIAGNOSIS — D509 Iron deficiency anemia, unspecified: Secondary | ICD-10-CM | POA: Diagnosis not present

## 2019-06-17 DIAGNOSIS — D509 Iron deficiency anemia, unspecified: Secondary | ICD-10-CM | POA: Diagnosis not present

## 2019-06-17 DIAGNOSIS — N186 End stage renal disease: Secondary | ICD-10-CM | POA: Diagnosis not present

## 2019-06-17 DIAGNOSIS — Z992 Dependence on renal dialysis: Secondary | ICD-10-CM | POA: Diagnosis not present

## 2019-06-17 DIAGNOSIS — N2581 Secondary hyperparathyroidism of renal origin: Secondary | ICD-10-CM | POA: Diagnosis not present

## 2019-06-17 DIAGNOSIS — D631 Anemia in chronic kidney disease: Secondary | ICD-10-CM | POA: Diagnosis not present

## 2019-06-19 DIAGNOSIS — N186 End stage renal disease: Secondary | ICD-10-CM | POA: Diagnosis not present

## 2019-06-19 DIAGNOSIS — D509 Iron deficiency anemia, unspecified: Secondary | ICD-10-CM | POA: Diagnosis not present

## 2019-06-19 DIAGNOSIS — D631 Anemia in chronic kidney disease: Secondary | ICD-10-CM | POA: Diagnosis not present

## 2019-06-19 DIAGNOSIS — Z992 Dependence on renal dialysis: Secondary | ICD-10-CM | POA: Diagnosis not present

## 2019-06-19 DIAGNOSIS — N2581 Secondary hyperparathyroidism of renal origin: Secondary | ICD-10-CM | POA: Diagnosis not present

## 2019-06-22 DIAGNOSIS — N2581 Secondary hyperparathyroidism of renal origin: Secondary | ICD-10-CM | POA: Diagnosis not present

## 2019-06-22 DIAGNOSIS — D509 Iron deficiency anemia, unspecified: Secondary | ICD-10-CM | POA: Diagnosis not present

## 2019-06-22 DIAGNOSIS — N186 End stage renal disease: Secondary | ICD-10-CM | POA: Diagnosis not present

## 2019-06-22 DIAGNOSIS — D631 Anemia in chronic kidney disease: Secondary | ICD-10-CM | POA: Diagnosis not present

## 2019-06-22 DIAGNOSIS — Z992 Dependence on renal dialysis: Secondary | ICD-10-CM | POA: Diagnosis not present

## 2019-06-24 ENCOUNTER — Telehealth: Payer: Self-pay

## 2019-06-24 DIAGNOSIS — D631 Anemia in chronic kidney disease: Secondary | ICD-10-CM | POA: Diagnosis not present

## 2019-06-24 DIAGNOSIS — Z992 Dependence on renal dialysis: Secondary | ICD-10-CM | POA: Diagnosis not present

## 2019-06-24 DIAGNOSIS — N186 End stage renal disease: Secondary | ICD-10-CM | POA: Diagnosis not present

## 2019-06-24 DIAGNOSIS — N2581 Secondary hyperparathyroidism of renal origin: Secondary | ICD-10-CM | POA: Diagnosis not present

## 2019-06-24 DIAGNOSIS — D509 Iron deficiency anemia, unspecified: Secondary | ICD-10-CM | POA: Diagnosis not present

## 2019-06-24 NOTE — Telephone Encounter (Signed)
Spoke with patient to inform of disconnected monitor. °

## 2019-06-26 DIAGNOSIS — N2581 Secondary hyperparathyroidism of renal origin: Secondary | ICD-10-CM | POA: Diagnosis not present

## 2019-06-26 DIAGNOSIS — D631 Anemia in chronic kidney disease: Secondary | ICD-10-CM | POA: Diagnosis not present

## 2019-06-26 DIAGNOSIS — N186 End stage renal disease: Secondary | ICD-10-CM | POA: Diagnosis not present

## 2019-06-26 DIAGNOSIS — D509 Iron deficiency anemia, unspecified: Secondary | ICD-10-CM | POA: Diagnosis not present

## 2019-06-26 DIAGNOSIS — Z992 Dependence on renal dialysis: Secondary | ICD-10-CM | POA: Diagnosis not present

## 2019-06-27 DIAGNOSIS — Z992 Dependence on renal dialysis: Secondary | ICD-10-CM | POA: Diagnosis not present

## 2019-06-27 DIAGNOSIS — N039 Chronic nephritic syndrome with unspecified morphologic changes: Secondary | ICD-10-CM | POA: Diagnosis not present

## 2019-06-27 DIAGNOSIS — N186 End stage renal disease: Secondary | ICD-10-CM | POA: Diagnosis not present

## 2019-06-27 LAB — CUP PACEART REMOTE DEVICE CHECK
Date Time Interrogation Session: 20210501011758
Implantable Pulse Generator Implant Date: 20180806

## 2019-06-29 ENCOUNTER — Ambulatory Visit (INDEPENDENT_AMBULATORY_CARE_PROVIDER_SITE_OTHER): Payer: Medicare Other | Admitting: *Deleted

## 2019-06-29 DIAGNOSIS — D631 Anemia in chronic kidney disease: Secondary | ICD-10-CM | POA: Diagnosis not present

## 2019-06-29 DIAGNOSIS — D509 Iron deficiency anemia, unspecified: Secondary | ICD-10-CM | POA: Diagnosis not present

## 2019-06-29 DIAGNOSIS — N2581 Secondary hyperparathyroidism of renal origin: Secondary | ICD-10-CM | POA: Diagnosis not present

## 2019-06-29 DIAGNOSIS — Z992 Dependence on renal dialysis: Secondary | ICD-10-CM | POA: Diagnosis not present

## 2019-06-29 DIAGNOSIS — N186 End stage renal disease: Secondary | ICD-10-CM | POA: Diagnosis not present

## 2019-06-29 DIAGNOSIS — I639 Cerebral infarction, unspecified: Secondary | ICD-10-CM

## 2019-06-30 NOTE — Progress Notes (Signed)
Carelink Summary Report / Loop Recorder 

## 2019-07-01 DIAGNOSIS — D509 Iron deficiency anemia, unspecified: Secondary | ICD-10-CM | POA: Diagnosis not present

## 2019-07-01 DIAGNOSIS — Z992 Dependence on renal dialysis: Secondary | ICD-10-CM | POA: Diagnosis not present

## 2019-07-01 DIAGNOSIS — N186 End stage renal disease: Secondary | ICD-10-CM | POA: Diagnosis not present

## 2019-07-01 DIAGNOSIS — N2581 Secondary hyperparathyroidism of renal origin: Secondary | ICD-10-CM | POA: Diagnosis not present

## 2019-07-01 DIAGNOSIS — D631 Anemia in chronic kidney disease: Secondary | ICD-10-CM | POA: Diagnosis not present

## 2019-07-03 DIAGNOSIS — D509 Iron deficiency anemia, unspecified: Secondary | ICD-10-CM | POA: Diagnosis not present

## 2019-07-03 DIAGNOSIS — D631 Anemia in chronic kidney disease: Secondary | ICD-10-CM | POA: Diagnosis not present

## 2019-07-03 DIAGNOSIS — Z992 Dependence on renal dialysis: Secondary | ICD-10-CM | POA: Diagnosis not present

## 2019-07-03 DIAGNOSIS — N186 End stage renal disease: Secondary | ICD-10-CM | POA: Diagnosis not present

## 2019-07-03 DIAGNOSIS — N2581 Secondary hyperparathyroidism of renal origin: Secondary | ICD-10-CM | POA: Diagnosis not present

## 2019-07-06 DIAGNOSIS — N186 End stage renal disease: Secondary | ICD-10-CM | POA: Diagnosis not present

## 2019-07-06 DIAGNOSIS — N2581 Secondary hyperparathyroidism of renal origin: Secondary | ICD-10-CM | POA: Diagnosis not present

## 2019-07-06 DIAGNOSIS — D509 Iron deficiency anemia, unspecified: Secondary | ICD-10-CM | POA: Diagnosis not present

## 2019-07-06 DIAGNOSIS — D631 Anemia in chronic kidney disease: Secondary | ICD-10-CM | POA: Diagnosis not present

## 2019-07-06 DIAGNOSIS — Z992 Dependence on renal dialysis: Secondary | ICD-10-CM | POA: Diagnosis not present

## 2019-07-08 DIAGNOSIS — N2581 Secondary hyperparathyroidism of renal origin: Secondary | ICD-10-CM | POA: Diagnosis not present

## 2019-07-08 DIAGNOSIS — D509 Iron deficiency anemia, unspecified: Secondary | ICD-10-CM | POA: Diagnosis not present

## 2019-07-08 DIAGNOSIS — D631 Anemia in chronic kidney disease: Secondary | ICD-10-CM | POA: Diagnosis not present

## 2019-07-08 DIAGNOSIS — N186 End stage renal disease: Secondary | ICD-10-CM | POA: Diagnosis not present

## 2019-07-08 DIAGNOSIS — Z992 Dependence on renal dialysis: Secondary | ICD-10-CM | POA: Diagnosis not present

## 2019-07-10 DIAGNOSIS — D509 Iron deficiency anemia, unspecified: Secondary | ICD-10-CM | POA: Diagnosis not present

## 2019-07-10 DIAGNOSIS — N2581 Secondary hyperparathyroidism of renal origin: Secondary | ICD-10-CM | POA: Diagnosis not present

## 2019-07-10 DIAGNOSIS — N186 End stage renal disease: Secondary | ICD-10-CM | POA: Diagnosis not present

## 2019-07-10 DIAGNOSIS — Z992 Dependence on renal dialysis: Secondary | ICD-10-CM | POA: Diagnosis not present

## 2019-07-10 DIAGNOSIS — D631 Anemia in chronic kidney disease: Secondary | ICD-10-CM | POA: Diagnosis not present

## 2019-07-13 DIAGNOSIS — N2581 Secondary hyperparathyroidism of renal origin: Secondary | ICD-10-CM | POA: Diagnosis not present

## 2019-07-13 DIAGNOSIS — N186 End stage renal disease: Secondary | ICD-10-CM | POA: Diagnosis not present

## 2019-07-13 DIAGNOSIS — D509 Iron deficiency anemia, unspecified: Secondary | ICD-10-CM | POA: Diagnosis not present

## 2019-07-13 DIAGNOSIS — Z992 Dependence on renal dialysis: Secondary | ICD-10-CM | POA: Diagnosis not present

## 2019-07-13 DIAGNOSIS — D631 Anemia in chronic kidney disease: Secondary | ICD-10-CM | POA: Diagnosis not present

## 2019-07-15 DIAGNOSIS — D509 Iron deficiency anemia, unspecified: Secondary | ICD-10-CM | POA: Diagnosis not present

## 2019-07-15 DIAGNOSIS — Z992 Dependence on renal dialysis: Secondary | ICD-10-CM | POA: Diagnosis not present

## 2019-07-15 DIAGNOSIS — N186 End stage renal disease: Secondary | ICD-10-CM | POA: Diagnosis not present

## 2019-07-15 DIAGNOSIS — N2581 Secondary hyperparathyroidism of renal origin: Secondary | ICD-10-CM | POA: Diagnosis not present

## 2019-07-15 DIAGNOSIS — D631 Anemia in chronic kidney disease: Secondary | ICD-10-CM | POA: Diagnosis not present

## 2019-07-16 DIAGNOSIS — T82858A Stenosis of vascular prosthetic devices, implants and grafts, initial encounter: Secondary | ICD-10-CM | POA: Diagnosis not present

## 2019-07-16 DIAGNOSIS — I871 Compression of vein: Secondary | ICD-10-CM | POA: Diagnosis not present

## 2019-07-16 DIAGNOSIS — Z992 Dependence on renal dialysis: Secondary | ICD-10-CM | POA: Diagnosis not present

## 2019-07-16 DIAGNOSIS — N186 End stage renal disease: Secondary | ICD-10-CM | POA: Diagnosis not present

## 2019-07-17 DIAGNOSIS — D631 Anemia in chronic kidney disease: Secondary | ICD-10-CM | POA: Diagnosis not present

## 2019-07-17 DIAGNOSIS — Z992 Dependence on renal dialysis: Secondary | ICD-10-CM | POA: Diagnosis not present

## 2019-07-17 DIAGNOSIS — N186 End stage renal disease: Secondary | ICD-10-CM | POA: Diagnosis not present

## 2019-07-17 DIAGNOSIS — D509 Iron deficiency anemia, unspecified: Secondary | ICD-10-CM | POA: Diagnosis not present

## 2019-07-17 DIAGNOSIS — N2581 Secondary hyperparathyroidism of renal origin: Secondary | ICD-10-CM | POA: Diagnosis not present

## 2019-07-20 DIAGNOSIS — Z992 Dependence on renal dialysis: Secondary | ICD-10-CM | POA: Diagnosis not present

## 2019-07-20 DIAGNOSIS — D631 Anemia in chronic kidney disease: Secondary | ICD-10-CM | POA: Diagnosis not present

## 2019-07-20 DIAGNOSIS — N2581 Secondary hyperparathyroidism of renal origin: Secondary | ICD-10-CM | POA: Diagnosis not present

## 2019-07-20 DIAGNOSIS — N186 End stage renal disease: Secondary | ICD-10-CM | POA: Diagnosis not present

## 2019-07-20 DIAGNOSIS — D509 Iron deficiency anemia, unspecified: Secondary | ICD-10-CM | POA: Diagnosis not present

## 2019-07-22 DIAGNOSIS — D631 Anemia in chronic kidney disease: Secondary | ICD-10-CM | POA: Diagnosis not present

## 2019-07-22 DIAGNOSIS — Z992 Dependence on renal dialysis: Secondary | ICD-10-CM | POA: Diagnosis not present

## 2019-07-22 DIAGNOSIS — D509 Iron deficiency anemia, unspecified: Secondary | ICD-10-CM | POA: Diagnosis not present

## 2019-07-22 DIAGNOSIS — N186 End stage renal disease: Secondary | ICD-10-CM | POA: Diagnosis not present

## 2019-07-22 DIAGNOSIS — N2581 Secondary hyperparathyroidism of renal origin: Secondary | ICD-10-CM | POA: Diagnosis not present

## 2019-07-24 DIAGNOSIS — D631 Anemia in chronic kidney disease: Secondary | ICD-10-CM | POA: Diagnosis not present

## 2019-07-24 DIAGNOSIS — D509 Iron deficiency anemia, unspecified: Secondary | ICD-10-CM | POA: Diagnosis not present

## 2019-07-24 DIAGNOSIS — N186 End stage renal disease: Secondary | ICD-10-CM | POA: Diagnosis not present

## 2019-07-24 DIAGNOSIS — Z992 Dependence on renal dialysis: Secondary | ICD-10-CM | POA: Diagnosis not present

## 2019-07-24 DIAGNOSIS — N2581 Secondary hyperparathyroidism of renal origin: Secondary | ICD-10-CM | POA: Diagnosis not present

## 2019-07-27 DIAGNOSIS — N2581 Secondary hyperparathyroidism of renal origin: Secondary | ICD-10-CM | POA: Diagnosis not present

## 2019-07-27 DIAGNOSIS — D509 Iron deficiency anemia, unspecified: Secondary | ICD-10-CM | POA: Diagnosis not present

## 2019-07-27 DIAGNOSIS — D631 Anemia in chronic kidney disease: Secondary | ICD-10-CM | POA: Diagnosis not present

## 2019-07-27 DIAGNOSIS — N186 End stage renal disease: Secondary | ICD-10-CM | POA: Diagnosis not present

## 2019-07-27 DIAGNOSIS — Z992 Dependence on renal dialysis: Secondary | ICD-10-CM | POA: Diagnosis not present

## 2019-07-28 DIAGNOSIS — N186 End stage renal disease: Secondary | ICD-10-CM | POA: Diagnosis not present

## 2019-07-28 DIAGNOSIS — Z992 Dependence on renal dialysis: Secondary | ICD-10-CM | POA: Diagnosis not present

## 2019-07-28 DIAGNOSIS — N039 Chronic nephritic syndrome with unspecified morphologic changes: Secondary | ICD-10-CM | POA: Diagnosis not present

## 2019-07-29 DIAGNOSIS — N186 End stage renal disease: Secondary | ICD-10-CM | POA: Diagnosis not present

## 2019-07-29 DIAGNOSIS — Z992 Dependence on renal dialysis: Secondary | ICD-10-CM | POA: Diagnosis not present

## 2019-07-29 DIAGNOSIS — D509 Iron deficiency anemia, unspecified: Secondary | ICD-10-CM | POA: Diagnosis not present

## 2019-07-29 DIAGNOSIS — D631 Anemia in chronic kidney disease: Secondary | ICD-10-CM | POA: Diagnosis not present

## 2019-07-29 DIAGNOSIS — N2581 Secondary hyperparathyroidism of renal origin: Secondary | ICD-10-CM | POA: Diagnosis not present

## 2019-07-31 DIAGNOSIS — D509 Iron deficiency anemia, unspecified: Secondary | ICD-10-CM | POA: Diagnosis not present

## 2019-07-31 DIAGNOSIS — N2581 Secondary hyperparathyroidism of renal origin: Secondary | ICD-10-CM | POA: Diagnosis not present

## 2019-07-31 DIAGNOSIS — Z992 Dependence on renal dialysis: Secondary | ICD-10-CM | POA: Diagnosis not present

## 2019-07-31 DIAGNOSIS — D631 Anemia in chronic kidney disease: Secondary | ICD-10-CM | POA: Diagnosis not present

## 2019-07-31 DIAGNOSIS — N186 End stage renal disease: Secondary | ICD-10-CM | POA: Diagnosis not present

## 2019-07-31 LAB — CUP PACEART REMOTE DEVICE CHECK
Date Time Interrogation Session: 20210603230741
Implantable Pulse Generator Implant Date: 20180806

## 2019-08-03 DIAGNOSIS — N2581 Secondary hyperparathyroidism of renal origin: Secondary | ICD-10-CM | POA: Diagnosis not present

## 2019-08-03 DIAGNOSIS — D631 Anemia in chronic kidney disease: Secondary | ICD-10-CM | POA: Diagnosis not present

## 2019-08-03 DIAGNOSIS — Z992 Dependence on renal dialysis: Secondary | ICD-10-CM | POA: Diagnosis not present

## 2019-08-03 DIAGNOSIS — N186 End stage renal disease: Secondary | ICD-10-CM | POA: Diagnosis not present

## 2019-08-03 DIAGNOSIS — D509 Iron deficiency anemia, unspecified: Secondary | ICD-10-CM | POA: Diagnosis not present

## 2019-08-05 DIAGNOSIS — N2581 Secondary hyperparathyroidism of renal origin: Secondary | ICD-10-CM | POA: Diagnosis not present

## 2019-08-05 DIAGNOSIS — D509 Iron deficiency anemia, unspecified: Secondary | ICD-10-CM | POA: Diagnosis not present

## 2019-08-05 DIAGNOSIS — N186 End stage renal disease: Secondary | ICD-10-CM | POA: Diagnosis not present

## 2019-08-05 DIAGNOSIS — Z992 Dependence on renal dialysis: Secondary | ICD-10-CM | POA: Diagnosis not present

## 2019-08-05 DIAGNOSIS — D631 Anemia in chronic kidney disease: Secondary | ICD-10-CM | POA: Diagnosis not present

## 2019-08-07 DIAGNOSIS — D509 Iron deficiency anemia, unspecified: Secondary | ICD-10-CM | POA: Diagnosis not present

## 2019-08-07 DIAGNOSIS — N2581 Secondary hyperparathyroidism of renal origin: Secondary | ICD-10-CM | POA: Diagnosis not present

## 2019-08-07 DIAGNOSIS — D631 Anemia in chronic kidney disease: Secondary | ICD-10-CM | POA: Diagnosis not present

## 2019-08-07 DIAGNOSIS — N186 End stage renal disease: Secondary | ICD-10-CM | POA: Diagnosis not present

## 2019-08-07 DIAGNOSIS — Z992 Dependence on renal dialysis: Secondary | ICD-10-CM | POA: Diagnosis not present

## 2019-08-10 DIAGNOSIS — Z992 Dependence on renal dialysis: Secondary | ICD-10-CM | POA: Diagnosis not present

## 2019-08-10 DIAGNOSIS — D509 Iron deficiency anemia, unspecified: Secondary | ICD-10-CM | POA: Diagnosis not present

## 2019-08-10 DIAGNOSIS — D631 Anemia in chronic kidney disease: Secondary | ICD-10-CM | POA: Diagnosis not present

## 2019-08-10 DIAGNOSIS — N2581 Secondary hyperparathyroidism of renal origin: Secondary | ICD-10-CM | POA: Diagnosis not present

## 2019-08-10 DIAGNOSIS — N186 End stage renal disease: Secondary | ICD-10-CM | POA: Diagnosis not present

## 2019-08-12 DIAGNOSIS — D631 Anemia in chronic kidney disease: Secondary | ICD-10-CM | POA: Diagnosis not present

## 2019-08-12 DIAGNOSIS — N186 End stage renal disease: Secondary | ICD-10-CM | POA: Diagnosis not present

## 2019-08-12 DIAGNOSIS — Z992 Dependence on renal dialysis: Secondary | ICD-10-CM | POA: Diagnosis not present

## 2019-08-12 DIAGNOSIS — N2581 Secondary hyperparathyroidism of renal origin: Secondary | ICD-10-CM | POA: Diagnosis not present

## 2019-08-12 DIAGNOSIS — D509 Iron deficiency anemia, unspecified: Secondary | ICD-10-CM | POA: Diagnosis not present

## 2019-08-14 DIAGNOSIS — D509 Iron deficiency anemia, unspecified: Secondary | ICD-10-CM | POA: Diagnosis not present

## 2019-08-14 DIAGNOSIS — Z992 Dependence on renal dialysis: Secondary | ICD-10-CM | POA: Diagnosis not present

## 2019-08-14 DIAGNOSIS — N2581 Secondary hyperparathyroidism of renal origin: Secondary | ICD-10-CM | POA: Diagnosis not present

## 2019-08-14 DIAGNOSIS — N186 End stage renal disease: Secondary | ICD-10-CM | POA: Diagnosis not present

## 2019-08-14 DIAGNOSIS — D631 Anemia in chronic kidney disease: Secondary | ICD-10-CM | POA: Diagnosis not present

## 2019-08-17 DIAGNOSIS — D509 Iron deficiency anemia, unspecified: Secondary | ICD-10-CM | POA: Diagnosis not present

## 2019-08-17 DIAGNOSIS — N2581 Secondary hyperparathyroidism of renal origin: Secondary | ICD-10-CM | POA: Diagnosis not present

## 2019-08-17 DIAGNOSIS — N186 End stage renal disease: Secondary | ICD-10-CM | POA: Diagnosis not present

## 2019-08-17 DIAGNOSIS — D631 Anemia in chronic kidney disease: Secondary | ICD-10-CM | POA: Diagnosis not present

## 2019-08-17 DIAGNOSIS — Z992 Dependence on renal dialysis: Secondary | ICD-10-CM | POA: Diagnosis not present

## 2019-08-19 DIAGNOSIS — N186 End stage renal disease: Secondary | ICD-10-CM | POA: Diagnosis not present

## 2019-08-19 DIAGNOSIS — D509 Iron deficiency anemia, unspecified: Secondary | ICD-10-CM | POA: Diagnosis not present

## 2019-08-19 DIAGNOSIS — N2581 Secondary hyperparathyroidism of renal origin: Secondary | ICD-10-CM | POA: Diagnosis not present

## 2019-08-19 DIAGNOSIS — D631 Anemia in chronic kidney disease: Secondary | ICD-10-CM | POA: Diagnosis not present

## 2019-08-19 DIAGNOSIS — Z992 Dependence on renal dialysis: Secondary | ICD-10-CM | POA: Diagnosis not present

## 2019-08-21 DIAGNOSIS — Z992 Dependence on renal dialysis: Secondary | ICD-10-CM | POA: Diagnosis not present

## 2019-08-21 DIAGNOSIS — D631 Anemia in chronic kidney disease: Secondary | ICD-10-CM | POA: Diagnosis not present

## 2019-08-21 DIAGNOSIS — N2581 Secondary hyperparathyroidism of renal origin: Secondary | ICD-10-CM | POA: Diagnosis not present

## 2019-08-21 DIAGNOSIS — D509 Iron deficiency anemia, unspecified: Secondary | ICD-10-CM | POA: Diagnosis not present

## 2019-08-21 DIAGNOSIS — N186 End stage renal disease: Secondary | ICD-10-CM | POA: Diagnosis not present

## 2019-08-24 DIAGNOSIS — D631 Anemia in chronic kidney disease: Secondary | ICD-10-CM | POA: Diagnosis not present

## 2019-08-24 DIAGNOSIS — N186 End stage renal disease: Secondary | ICD-10-CM | POA: Diagnosis not present

## 2019-08-24 DIAGNOSIS — D509 Iron deficiency anemia, unspecified: Secondary | ICD-10-CM | POA: Diagnosis not present

## 2019-08-24 DIAGNOSIS — Z992 Dependence on renal dialysis: Secondary | ICD-10-CM | POA: Diagnosis not present

## 2019-08-24 DIAGNOSIS — N2581 Secondary hyperparathyroidism of renal origin: Secondary | ICD-10-CM | POA: Diagnosis not present

## 2019-08-24 NOTE — Progress Notes (Signed)
Virtual Visit via Telephone Note   This visit type was conducted due to national recommendations for restrictions regarding the COVID-19 Pandemic (e.g. social distancing) in an effort to limit this patient's exposure and mitigate transmission in our community.  Due to her co-morbid illnesses, this patient is at least at moderate risk for complications without adequate follow up.  This format is felt to be most appropriate for this patient at this time.  The patient did not have access to video technology/had technical difficulties with video requiring transitioning to audio format only (telephone).  All issues noted in this document were discussed and addressed.  No physical exam could be performed with this format.  Please refer to the patient's chart for her  consent to telehealth for [facility].   Evaluation Performed:  Follow-up visit  This visit type was conducted due to national recommendations for restrictions regarding the COVID-19 Pandemic (e.g. social distancing).  This format is felt to be most appropriate for this patient at this time.  All issues noted in this document were discussed and addressed.  No physical exam was performed (except for noted visual exam findings with Video Visits).  Please refer to the patient's chart (MyChart message for video visits and phone note for telephone visits) for the patient's consent to telehealth for [facility].  Date:  08/24/2019   ID:  [name] Marks, DOB 02/05/1956, MRN [id number]  Patient Location:  Home  Provider location:   [facility]  Cardiology Office Note:    Date:  08/25/2019   ID:  [name] Marks, DOB [birth year], MRN [id number]  PCP:  Patriciaann Clan, DO  Cardiologist:  Fransico Him, MD    Referring MD: Patriciaann Clan, DO   Chief Complaint  Patient presents with  . Follow-up    AV disease    History of Present Illness:    [name] Marks is a 64 y.o. 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  This was complicated by severe aortic insufficiency.  She underwent cardiac cath showing normal coronary arteries and underwent bioprosthetic AVR.  Unfortunately she subsequently had a TIA in March 2018.  She has known moderate TR and moderate pulmonary hypertension with last PASP 47 mmHg 02/29/2016.    She is here today for followup and is doing well.  She has started having chest pain when she is getting HD.  She says that she has not told them about it.  She says that the pain gets to a 4/10 pain and feels like a sharp pain with no radiation or associated sx of Nausea or diaphoresis.  She says that the pain stop once she gets off the machine.  She does not get the pain with any exertional activities and is only with HD and not every time.  She denies any  SOB, DOE, PND, orthopnea, LE edema, palpitations or syncope. She occasionally has some dizziness.  She is compliant with his meds and is tolerating meds with no SE.    Past Medical History:  Diagnosis Date  . Anemia   . Aortic valve prosthesis present 02/25/2016  . Arthritis    "knees" (01/03/2017)  . AVD (aortic valve disease) 07/12/2016  . Backache 12/06/2008  . Bacteremia due to coagulase-negative Staphylococcus   . Carpal tunnel syndrome   . Cerebral embolism with transient ischemic attack (TIA)   . Cholelithiases 01/28/2017  .  Chronic female pelvic pain 08/08/2012  . Complication of anesthesia    01/01/17- '"a long time ago, difficulty breathimg, not sure if it was due to anesthesia or not."  . CVA (cerebral vascular accident) ([place]) 05/16/2016  . End stage renal disease on dialysis ([place])    "MWF; [facility]." (01/03/2017)  . Endocarditis   . Esophageal reflux 12/06/2008  . GERD (gastroesophageal reflux disease)   . Gout   . History of blood transfusion 2017   "related to blood poison" (01/03/2017)  . Increased endometrial stripe  thickness 11/03/2015  . Neck pain   . Osteoporosis 09/2016   T score -2.6  . Pain and swelling of right upper extremity 12/20/2015  . Pulmonary HTN ([place]) 07/12/2016  . Renal dialysis device, implant, or graft complication 36/62/[id number]  . Renal insufficiency   . S/P cholecystectomy 02/14/2017  . Septic shock ([place])   . Staphylococcus aureus bacteremia   . TIA (transient ischemic attack)    "several" (01/03/2017)    Past Surgical History:  Procedure Laterality Date  . AORTIC VALVE REPLACEMENT N/A 02/25/2016   Procedure: AORTIC VALVE REPLACEMENT (AVR) implanted with Magna Ease Aortic valve size 50mm;  Surgeon: Melrose Nakayama, MD;  Location: [place];  Service: Open Heart Surgery;  Laterality: N/A;  . AV FISTULA PLACEMENT Left 01/03/2017   Procedure: INSERTION OF ARTERIOVENOUS (AV) GORE-TEX GRAFT LEFT THIGH;  Surgeon: Serafina Mitchell, MD;  Location: [place];  Service: Vascular;  Laterality: Left;  . [place] REMOVAL Right 02/17/2016   Procedure: REMOVAL OF TWO ARTERIOVENOUS GORETEX GRAFTS ([place]);  Surgeon: Angelia Mould, MD;  Location: [place];  Service: Vascular;  Laterality: Right;  . BREAST BIOPSY Left 2013   stereo   . BREAST BIOPSY Right 2011   stereo   . CARDIAC VALVE REPLACEMENT    . CHOLECYSTECTOMY N/A 01/30/2017   Procedure: LAPAROSCOPIC CHOLECYSTECTOMY;  Surgeon: Erroll Luna, MD;  Location: [place];  Service: General;  Laterality: N/A;  . COLONOSCOPY W/ POLYPECTOMY    . DG AV DIALYSIS GRAFT DECLOT OR    . DILATATION & CURETTAGE/HYSTEROSCOPY WITH TRUECLEAR N/A 11/06/2012   Procedure: DILATATION & CURETTAGE/HYSTEROSCOPY WITH TRUECLEAR;  Surgeon: Terrance Mass, MD;  Location: [facility];  Service: Gynecology;  Laterality: N/A;  Truclear Resectoscopic Polypectomy   . INSERTION OF DIALYSIS CATHETER N/A 02/19/2016   Procedure: INSERTION OF Left Internal Jugular DIALYSIS CATHETER;  Surgeon: Angelia Mould, MD;  Location: [place];  Service: Vascular;  Laterality: N/A;  . LOOP  RECORDER INSERTION N/A 10/01/2016   Procedure: LOOP RECORDER INSERTION;  Surgeon: Constance Haw, MD;  Location: [place] [facility];  Service: Cardiovascular;  Laterality: N/A;  . PATCH ANGIOPLASTY Right 02/17/2016   Procedure: PATCH ANGIOPLASTY;  Surgeon: Angelia Mould, MD;  Location: [place];  Service: Vascular;  Laterality: Right;  . PERIPHERAL VASCULAR CATHETERIZATION N/A 09/14/2014   Procedure: A/V Shuntogram/Fistulagram;  Surgeon: Katha Cabal, MD;  Location: [place] [facility];  Service: Cardiovascular;  Laterality: N/A;  . PERIPHERAL VASCULAR CATHETERIZATION N/A 09/14/2014   Procedure: A/V Shunt Intervention;  Surgeon: Katha Cabal, MD;  Location: [place] [facility];  Service: Cardiovascular;  Laterality: N/A;  . PERIPHERAL VASCULAR CATHETERIZATION Right 12/09/2014   Procedure: A/V Shuntogram/Fistulagram;  Surgeon: Algernon Huxley, MD;  Location: [place] [facility];  Service: Cardiovascular;  Laterality: Right;  . PERIPHERAL VASCULAR CATHETERIZATION N/A 12/09/2014   Procedure: A/V Shunt Intervention;  Surgeon: Algernon Huxley, MD;  Location: [place] [facility];  Service: Cardiovascular;  Laterality: N/A;  . PERIPHERAL VASCULAR CATHETERIZATION Right 05/24/2015   Procedure: A/V Shuntogram;  Surgeon: Serafina Mitchell, MD;  Location: [place] [facility];  Service: Cardiovascular;  Laterality: Right;  . PERIPHERAL VASCULAR CATHETERIZATION Right 05/24/2015   Procedure: Peripheral Vascular Balloon Angioplasty;  Surgeon: Serafina Mitchell, MD;  Location: [place] [facility];  Service: Cardiovascular;  Laterality: Right;  right arm shunt  . PERIPHERAL VASCULAR CATHETERIZATION N/A 06/13/2015   Procedure: A/V Shuntogram/Fistulagram;  Surgeon: Algernon Huxley, MD;  Location: [place] [facility];  Service: Cardiovascular;  Laterality: N/A;  . PERIPHERAL VASCULAR CATHETERIZATION N/A 06/13/2015   Procedure: A/V Shunt Intervention;  Surgeon: Algernon Huxley, MD;  Location: [place] [facility];   Service: Cardiovascular;  Laterality: N/A;  . TEE WITHOUT CARDIOVERSION N/A 02/22/2016   Procedure: TRANSESOPHAGEAL ECHOCARDIOGRAM (TEE);  Surgeon: Pixie Casino, MD;  Location: [facility];  Service: Cardiovascular;  Laterality: N/A;  . TEE WITHOUT CARDIOVERSION N/A 02/25/2016   Procedure: TRANSESOPHAGEAL ECHOCARDIOGRAM (TEE);  Surgeon: Melrose Nakayama, MD;  Location: [place];  Service: Open Heart Surgery;  Laterality: N/A;  . TEE WITHOUT CARDIOVERSION N/A 10/01/2016   Procedure: TRANSESOPHAGEAL ECHOCARDIOGRAM (TEE);  Surgeon: Pixie Casino, MD;  Location: [facility] ENDOSCOPY;  Service: Cardiovascular;  Laterality: N/A;  . TUBAL LIGATION  1983    Current Medications: Current Meds  Medication Sig  . albuterol (PROVENTIL HFA;VENTOLIN HFA) 108 (90 Base) MCG/ACT inhaler Inhale 2 puffs into the lungs as needed.  . cinacalcet (SENSIPAR) 60 MG tablet Take 60 mg by mouth at bedtime.   . clopidogrel (PLAVIX) 75 MG tablet Take 1 tablet (75 mg total) by mouth daily. (Patient taking differently: Take 75 mg by mouth at bedtime. )  . famotidine (PEPCID) 20 MG tablet Take 20 mg by mouth 2 (two) times daily. Lunch and dinner  . Multiple Vitamins-Minerals (PRORENAL QD PO) Take 1 tablet by mouth at bedtime.   . sevelamer (RENVELA) 800 MG tablet Take 1,600 mg by mouth 3 (three) times daily with meals.      Allergies:   Contrast media [iodinated diagnostic agents], Ancef [cefazolin], and Naproxen sodium   Social History   Socioeconomic History  . Marital status: Divorced    Spouse name: Not on file  . Number of children: Not on file  . Years of education: Not on file  . Highest education level: Not on file  Occupational History  . Not on file  Tobacco Use  . Smoking status: Never Smoker  . Smokeless tobacco: Never Used  Vaping Use  . Vaping Use: Never used  Substance and Sexual Activity  . Alcohol use: No  . Drug use: No  . Sexual activity: Never    Birth control/protection: Post-menopausal     Comment: 1st intercourse 64 yo-Fewer than 5 partners  Other Topics Concern  . Not on file  Social History Narrative   Uses cane at home.   Social Determinants of Health   Financial Resource Strain:   . Difficulty of Paying Living Expenses:   Food Insecurity:   . Worried About Charity fundraiser in the Last Year:   . Arboriculturist in the Last Year:   Transportation Needs:   . Film/video editor (Medical):   Marland Kitchen Lack of Transportation (Non-Medical):   Physical Activity:   . Days of Exercise per Week:   . Minutes of Exercise per Session:   Stress:   . Feeling of Stress :  Social Connections:   . Frequency of Communication with Friends and Family:   . Frequency of Social Gatherings with Friends and Family:   . Attends Religious Services:   . Active Member of Clubs or Organizations:   . Attends Archivist Meetings:   Marland Kitchen Marital Status:      Family History: The patient's family history includes Alcohol abuse in her father and mother; Breast cancer (age of onset: 38) in her sister; Cancer in her father; Diabetes in her mother; Heart disease in her mother; Hypertension in her mother, sister, and son; Kidney disease in her mother, sister, and son.  ROS:   Please see the history of present illness.    ROS  All other systems reviewed and negative.   EKGs/Labs/Other Studies Reviewed:    The following studies were reviewed today: none  EKG:  EKG is  ordered today.  The ekg ordered today demonstrates sinus rhythm at 63 bpm with ST-T wave abnormality in the inferior lateral leads which is unchanged from EKGs dating back to 2018.  Recent Labs: 10/03/2018: BUN 15; Creatinine, Ser 4.89; Hemoglobin 10.4; Platelets 260; Potassium 4.7; Sodium 138   Recent Lipid Panel    Component Value Date/Time   CHOL 147 09/29/2016 0531   TRIG 83 09/29/2016 0531   HDL 66 09/29/2016 0531   CHOLHDL 2.2 09/29/2016 0531   VLDL 17 09/29/2016 0531   LDLCALC 64 09/29/2016 0531     Physical Exam:    VS:  Ht 5' (1.524 m)   Wt 167 lb (75.8 kg)   BMI 32.61 kg/m     Wt Readings from Last 3 Encounters:  08/25/19 167 lb (75.8 kg)  12/18/18 165 lb (74.8 kg)  11/25/18 166 lb (75.3 kg)     ASSESSMENT:PLAN:    In order of problems listed above:  1.  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  2.  Pulmonary hypertension  -this resolved on echo 01/2017 with PASP 33 mmHg  3.  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   -she is on Plavix instead of aspirin for TIA.  4.  Tricuspid regurgitation -moderate by echo 2018 -repeat echo to assess for stability  5.  Atypical chest pain -this is very atypical and only occurs on HD and not every time.  There is no exertional component.   -her cath in 2017 showed normal cors   COVID-19 Education: The signs and symptoms of COVID-19 were discussed with the patient and how to seek care for testing (follow up with PCP or arrange E-visit).  The importance of social distancing was discussed today.  She has not been vaccinated and we discussed the importance of getting vaccinated especially if she goes back to the [facility]  Patient Risk:   After full review of this patient's clinical status, I feel that they are at least moderate risk at this time.  Time:   Today, I have spent 20 minutes on telemedicine discussing medical problems including AR, endocarditis, TR and reviewing patient's chart including 2D echo 2018.  Tests Ordered: No orders of the defined types were placed in this encounter.  Medication Changes: No orders of the defined types were placed in this encounter.   Disposition:  Follow up in 1 year(s)   Medication Adjustments/Labs and Tests Ordered: Current medicines are reviewed at length  with  the patient today.  Concerns regarding medicines are outlined above.  No orders of the defined types were placed in this encounter.  No orders of the defined types were placed in this encounter.   Signed, Fransico Him, MD  08/25/2019 8:45 AM    [place]

## 2019-08-25 ENCOUNTER — Encounter: Payer: Self-pay | Admitting: Cardiology

## 2019-08-25 ENCOUNTER — Telehealth (INDEPENDENT_AMBULATORY_CARE_PROVIDER_SITE_OTHER): Payer: Medicare Other | Admitting: Cardiology

## 2019-08-25 ENCOUNTER — Other Ambulatory Visit: Payer: Self-pay

## 2019-08-25 ENCOUNTER — Telehealth: Payer: Self-pay

## 2019-08-25 VITALS — Ht 60.0 in | Wt 167.0 lb

## 2019-08-25 DIAGNOSIS — I071 Rheumatic tricuspid insufficiency: Secondary | ICD-10-CM | POA: Diagnosis not present

## 2019-08-25 DIAGNOSIS — I359 Nonrheumatic aortic valve disorder, unspecified: Secondary | ICD-10-CM

## 2019-08-25 DIAGNOSIS — I272 Pulmonary hypertension, unspecified: Secondary | ICD-10-CM

## 2019-08-25 DIAGNOSIS — I669 Occlusion and stenosis of unspecified cerebral artery: Secondary | ICD-10-CM

## 2019-08-25 DIAGNOSIS — Z8679 Personal history of other diseases of the circulatory system: Secondary | ICD-10-CM

## 2019-08-25 NOTE — Addendum Note (Signed)
Addended by: Antonieta Iba on: 08/25/2019 09:06 AM   Modules accepted: Orders

## 2019-08-25 NOTE — Patient Instructions (Signed)
Medication Instructions:  Your physician recommends that you continue on your current medications as directed. Please refer to the Current Medication list given to you today.  *If you need a refill on your cardiac medications before your next appointment, please call your pharmacy*   Testing/Procedures: Your physician has requested that you have an echocardiogram. Echocardiography is a painless test that uses sound waves to create images of your heart. It provides your doctor with information about the size and shape of your heart and how well your heart's chambers and valves are working. This procedure takes approximately one hour. There are no restrictions for this procedure.  Follow-Up: At [facility], you and your health needs are our priority.  As part of our continuing mission to provide you with exceptional heart care, we have created designated Provider Care Teams.  These Care Teams include your primary Cardiologist (physician) and Advanced Practice Providers (APPs -  Physician Assistants and Nurse Practitioners) who all work together to provide you with the care you need, when you need it.  We recommend signing up for the patient portal called "MyChart".  Sign up information is provided on this After Visit Summary.  MyChart is used to connect with patients for Virtual Visits (Telemedicine).  Patients are able to view lab/test results, encounter notes, upcoming appointments, etc.  Non-urgent messages can be sent to your provider as well.   To learn more about what you can do with MyChart, go to [email or web address].    Your next appointment:   1 year(s)  The format for your next appointment:   Either In Person or Virtual  Provider:   You may see Fransico Him, MD or one of the following Advanced Practice Providers on your designated Care Team:    Melina Copa, PA-C  Ermalinda Barrios, PA-C

## 2019-08-25 NOTE — Telephone Encounter (Signed)
  Patient Consent for Virtual Visit         [name] Marks has provided verbal consent on 08/25/2019 for a virtual visit (video or telephone).   CONSENT FOR VIRTUAL VISIT FOR:  [name] Marks  By participating in this virtual visit I agree to the following:  I hereby voluntarily request, consent and authorize [facility] and its employed or contracted physicians, physician assistants, nurse practitioners or other licensed health care professionals (the Practitioner), to provide me with telemedicine health care services (the "Services") as deemed necessary by the treating Practitioner. I acknowledge and consent to receive the Services by the Practitioner via telemedicine. I understand that the telemedicine visit will involve communicating with the Practitioner through live audiovisual communication technology and the disclosure of certain medical information by electronic transmission. I acknowledge that I have been given the opportunity to request an in-person assessment or other available alternative prior to the telemedicine visit and am voluntarily participating in the telemedicine visit.  I understand that I have the right to withhold or withdraw my consent to the use of telemedicine in the course of my care at any time, without affecting my right to future care or treatment, and that the Practitioner or I may terminate the telemedicine visit at any time. I understand that I have the right to inspect all information obtained and/or recorded in the course of the telemedicine visit and may receive copies of available information for a reasonable fee.  I understand that some of the potential risks of receiving the Services via telemedicine include:  Marland Kitchen Delay or interruption in medical evaluation due to technological equipment failure or disruption; . Information transmitted may not be sufficient (e.g. poor resolution of images) to allow for appropriate medical decision making by the Practitioner;  and/or  . In rare instances, security protocols could fail, causing a breach of personal health information.  Furthermore, I acknowledge that it is my responsibility to provide information about my medical history, conditions and care that is complete and accurate to the best of my ability. I acknowledge that Practitioner's advice, recommendations, and/or decision may be based on factors not within their control, such as incomplete or inaccurate data provided by me or distortions of diagnostic images or specimens that may result from electronic transmissions. I understand that the practice of medicine is not an exact science and that Practitioner makes no warranties or guarantees regarding treatment outcomes. I acknowledge that a copy of this consent can be made available to me via my patient portal ([place]), or I can request a printed copy by calling the office of [place].    I understand that my insurance will be billed for this visit.   I have read or had this consent read to me. . I understand the contents of this consent, which adequately explains the benefits and risks of the Services being provided via telemedicine.  . I have been provided ample opportunity to ask questions regarding this consent and the Services and have had my questions answered to my satisfaction. . I give my informed consent for the services to be provided through the use of telemedicine in my medical care

## 2019-08-26 DIAGNOSIS — D509 Iron deficiency anemia, unspecified: Secondary | ICD-10-CM | POA: Diagnosis not present

## 2019-08-26 DIAGNOSIS — Z992 Dependence on renal dialysis: Secondary | ICD-10-CM | POA: Diagnosis not present

## 2019-08-26 DIAGNOSIS — N186 End stage renal disease: Secondary | ICD-10-CM | POA: Diagnosis not present

## 2019-08-26 DIAGNOSIS — D631 Anemia in chronic kidney disease: Secondary | ICD-10-CM | POA: Diagnosis not present

## 2019-08-26 DIAGNOSIS — N2581 Secondary hyperparathyroidism of renal origin: Secondary | ICD-10-CM | POA: Diagnosis not present

## 2019-08-27 ENCOUNTER — Other Ambulatory Visit: Payer: Self-pay

## 2019-08-27 ENCOUNTER — Ambulatory Visit (HOSPITAL_COMMUNITY): Payer: Medicare Other | Attending: Cardiology

## 2019-08-27 DIAGNOSIS — N039 Chronic nephritic syndrome with unspecified morphologic changes: Secondary | ICD-10-CM | POA: Diagnosis not present

## 2019-08-27 DIAGNOSIS — N2581 Secondary hyperparathyroidism of renal origin: Secondary | ICD-10-CM | POA: Diagnosis not present

## 2019-08-27 DIAGNOSIS — D631 Anemia in chronic kidney disease: Secondary | ICD-10-CM | POA: Diagnosis not present

## 2019-08-27 DIAGNOSIS — Z992 Dependence on renal dialysis: Secondary | ICD-10-CM | POA: Diagnosis not present

## 2019-08-27 DIAGNOSIS — I071 Rheumatic tricuspid insufficiency: Secondary | ICD-10-CM | POA: Insufficient documentation

## 2019-08-27 DIAGNOSIS — N186 End stage renal disease: Secondary | ICD-10-CM | POA: Diagnosis not present

## 2019-08-27 DIAGNOSIS — D509 Iron deficiency anemia, unspecified: Secondary | ICD-10-CM | POA: Diagnosis not present

## 2019-08-28 DIAGNOSIS — D631 Anemia in chronic kidney disease: Secondary | ICD-10-CM | POA: Diagnosis not present

## 2019-08-28 DIAGNOSIS — Z992 Dependence on renal dialysis: Secondary | ICD-10-CM | POA: Diagnosis not present

## 2019-08-28 DIAGNOSIS — D509 Iron deficiency anemia, unspecified: Secondary | ICD-10-CM | POA: Diagnosis not present

## 2019-08-28 DIAGNOSIS — N186 End stage renal disease: Secondary | ICD-10-CM | POA: Diagnosis not present

## 2019-08-28 DIAGNOSIS — N2581 Secondary hyperparathyroidism of renal origin: Secondary | ICD-10-CM | POA: Diagnosis not present

## 2019-08-31 DIAGNOSIS — N2581 Secondary hyperparathyroidism of renal origin: Secondary | ICD-10-CM | POA: Diagnosis not present

## 2019-08-31 DIAGNOSIS — D631 Anemia in chronic kidney disease: Secondary | ICD-10-CM | POA: Diagnosis not present

## 2019-08-31 DIAGNOSIS — Z992 Dependence on renal dialysis: Secondary | ICD-10-CM | POA: Diagnosis not present

## 2019-08-31 DIAGNOSIS — D509 Iron deficiency anemia, unspecified: Secondary | ICD-10-CM | POA: Diagnosis not present

## 2019-08-31 DIAGNOSIS — N186 End stage renal disease: Secondary | ICD-10-CM | POA: Diagnosis not present

## 2019-09-02 DIAGNOSIS — N186 End stage renal disease: Secondary | ICD-10-CM | POA: Diagnosis not present

## 2019-09-02 DIAGNOSIS — D509 Iron deficiency anemia, unspecified: Secondary | ICD-10-CM | POA: Diagnosis not present

## 2019-09-02 DIAGNOSIS — Z992 Dependence on renal dialysis: Secondary | ICD-10-CM | POA: Diagnosis not present

## 2019-09-02 DIAGNOSIS — N2581 Secondary hyperparathyroidism of renal origin: Secondary | ICD-10-CM | POA: Diagnosis not present

## 2019-09-02 DIAGNOSIS — D631 Anemia in chronic kidney disease: Secondary | ICD-10-CM | POA: Diagnosis not present

## 2019-09-03 ENCOUNTER — Ambulatory Visit (INDEPENDENT_AMBULATORY_CARE_PROVIDER_SITE_OTHER): Payer: Medicare Other | Admitting: *Deleted

## 2019-09-03 DIAGNOSIS — I639 Cerebral infarction, unspecified: Secondary | ICD-10-CM

## 2019-09-03 LAB — CUP PACEART REMOTE DEVICE CHECK
Date Time Interrogation Session: 20210707232616
Implantable Pulse Generator Implant Date: 20180806

## 2019-09-04 DIAGNOSIS — Z992 Dependence on renal dialysis: Secondary | ICD-10-CM | POA: Diagnosis not present

## 2019-09-04 DIAGNOSIS — D509 Iron deficiency anemia, unspecified: Secondary | ICD-10-CM | POA: Diagnosis not present

## 2019-09-04 DIAGNOSIS — N186 End stage renal disease: Secondary | ICD-10-CM | POA: Diagnosis not present

## 2019-09-04 DIAGNOSIS — N2581 Secondary hyperparathyroidism of renal origin: Secondary | ICD-10-CM | POA: Diagnosis not present

## 2019-09-04 DIAGNOSIS — D631 Anemia in chronic kidney disease: Secondary | ICD-10-CM | POA: Diagnosis not present

## 2019-09-07 ENCOUNTER — Telehealth: Payer: Self-pay | Admitting: Cardiology

## 2019-09-07 DIAGNOSIS — D631 Anemia in chronic kidney disease: Secondary | ICD-10-CM | POA: Diagnosis not present

## 2019-09-07 DIAGNOSIS — N186 End stage renal disease: Secondary | ICD-10-CM | POA: Diagnosis not present

## 2019-09-07 DIAGNOSIS — Z992 Dependence on renal dialysis: Secondary | ICD-10-CM | POA: Diagnosis not present

## 2019-09-07 DIAGNOSIS — I071 Rheumatic tricuspid insufficiency: Secondary | ICD-10-CM

## 2019-09-07 DIAGNOSIS — D509 Iron deficiency anemia, unspecified: Secondary | ICD-10-CM | POA: Diagnosis not present

## 2019-09-07 DIAGNOSIS — N2581 Secondary hyperparathyroidism of renal origin: Secondary | ICD-10-CM | POA: Diagnosis not present

## 2019-09-07 NOTE — Telephone Encounter (Signed)
[name] Margarita, MD  Patriciaann Clan, DO; Antonieta Iba, RN Echo showed normal heart function with mildly enlarged RV/RA and LA, mildly leaky MV, severe leaky TV, stable AVR normal function. TR has worsened since last echo - please refer to Dr. Burt Knack with structural heart clinic    The patient has been notified of the result and verbalized understanding.  All questions (if any) were answered. Antonieta Iba, RN 09/07/2019 3:26 PM

## 2019-09-07 NOTE — Telephone Encounter (Signed)
Patient is returning call to discuss results from echocardiogram completed on 08/27/19. Please call.

## 2019-09-07 NOTE — Progress Notes (Signed)
Carelink Summary Report / Loop Recorder 

## 2019-09-09 DIAGNOSIS — N186 End stage renal disease: Secondary | ICD-10-CM | POA: Diagnosis not present

## 2019-09-09 DIAGNOSIS — D631 Anemia in chronic kidney disease: Secondary | ICD-10-CM | POA: Diagnosis not present

## 2019-09-09 DIAGNOSIS — N2581 Secondary hyperparathyroidism of renal origin: Secondary | ICD-10-CM | POA: Diagnosis not present

## 2019-09-09 DIAGNOSIS — Z992 Dependence on renal dialysis: Secondary | ICD-10-CM | POA: Diagnosis not present

## 2019-09-09 DIAGNOSIS — D509 Iron deficiency anemia, unspecified: Secondary | ICD-10-CM | POA: Diagnosis not present

## 2019-09-11 DIAGNOSIS — Z992 Dependence on renal dialysis: Secondary | ICD-10-CM | POA: Diagnosis not present

## 2019-09-11 DIAGNOSIS — D509 Iron deficiency anemia, unspecified: Secondary | ICD-10-CM | POA: Diagnosis not present

## 2019-09-11 DIAGNOSIS — N186 End stage renal disease: Secondary | ICD-10-CM | POA: Diagnosis not present

## 2019-09-11 DIAGNOSIS — D631 Anemia in chronic kidney disease: Secondary | ICD-10-CM | POA: Diagnosis not present

## 2019-09-11 DIAGNOSIS — N2581 Secondary hyperparathyroidism of renal origin: Secondary | ICD-10-CM | POA: Diagnosis not present

## 2019-09-14 DIAGNOSIS — Z992 Dependence on renal dialysis: Secondary | ICD-10-CM | POA: Diagnosis not present

## 2019-09-14 DIAGNOSIS — D631 Anemia in chronic kidney disease: Secondary | ICD-10-CM | POA: Diagnosis not present

## 2019-09-14 DIAGNOSIS — N2581 Secondary hyperparathyroidism of renal origin: Secondary | ICD-10-CM | POA: Diagnosis not present

## 2019-09-14 DIAGNOSIS — N186 End stage renal disease: Secondary | ICD-10-CM | POA: Diagnosis not present

## 2019-09-14 DIAGNOSIS — D509 Iron deficiency anemia, unspecified: Secondary | ICD-10-CM | POA: Diagnosis not present

## 2019-09-15 NOTE — Telephone Encounter (Signed)
Dr Burt Knack reviewed the pt's chart and made Dr Radford Pax aware that at this time there are not any structural procedures we can offer the pt.  There is a TR trial called Triluminate but ESRD pt's are excluded. Referral to structural heart will be cancelled at this time and per TT the pt will follow-up with her in 1 year.

## 2019-09-16 DIAGNOSIS — D631 Anemia in chronic kidney disease: Secondary | ICD-10-CM | POA: Diagnosis not present

## 2019-09-16 DIAGNOSIS — D509 Iron deficiency anemia, unspecified: Secondary | ICD-10-CM | POA: Diagnosis not present

## 2019-09-16 DIAGNOSIS — Z992 Dependence on renal dialysis: Secondary | ICD-10-CM | POA: Diagnosis not present

## 2019-09-16 DIAGNOSIS — N2581 Secondary hyperparathyroidism of renal origin: Secondary | ICD-10-CM | POA: Diagnosis not present

## 2019-09-16 DIAGNOSIS — N186 End stage renal disease: Secondary | ICD-10-CM | POA: Diagnosis not present

## 2019-09-18 DIAGNOSIS — Z992 Dependence on renal dialysis: Secondary | ICD-10-CM | POA: Diagnosis not present

## 2019-09-18 DIAGNOSIS — D631 Anemia in chronic kidney disease: Secondary | ICD-10-CM | POA: Diagnosis not present

## 2019-09-18 DIAGNOSIS — D509 Iron deficiency anemia, unspecified: Secondary | ICD-10-CM | POA: Diagnosis not present

## 2019-09-18 DIAGNOSIS — N186 End stage renal disease: Secondary | ICD-10-CM | POA: Diagnosis not present

## 2019-09-18 DIAGNOSIS — N2581 Secondary hyperparathyroidism of renal origin: Secondary | ICD-10-CM | POA: Diagnosis not present

## 2019-09-21 DIAGNOSIS — D509 Iron deficiency anemia, unspecified: Secondary | ICD-10-CM | POA: Diagnosis not present

## 2019-09-21 DIAGNOSIS — N186 End stage renal disease: Secondary | ICD-10-CM | POA: Diagnosis not present

## 2019-09-21 DIAGNOSIS — N2581 Secondary hyperparathyroidism of renal origin: Secondary | ICD-10-CM | POA: Diagnosis not present

## 2019-09-21 DIAGNOSIS — D631 Anemia in chronic kidney disease: Secondary | ICD-10-CM | POA: Diagnosis not present

## 2019-09-21 DIAGNOSIS — Z992 Dependence on renal dialysis: Secondary | ICD-10-CM | POA: Diagnosis not present

## 2019-09-23 DIAGNOSIS — D631 Anemia in chronic kidney disease: Secondary | ICD-10-CM | POA: Diagnosis not present

## 2019-09-23 DIAGNOSIS — D509 Iron deficiency anemia, unspecified: Secondary | ICD-10-CM | POA: Diagnosis not present

## 2019-09-23 DIAGNOSIS — Z992 Dependence on renal dialysis: Secondary | ICD-10-CM | POA: Diagnosis not present

## 2019-09-23 DIAGNOSIS — N186 End stage renal disease: Secondary | ICD-10-CM | POA: Diagnosis not present

## 2019-09-23 DIAGNOSIS — N2581 Secondary hyperparathyroidism of renal origin: Secondary | ICD-10-CM | POA: Diagnosis not present

## 2019-09-25 DIAGNOSIS — D509 Iron deficiency anemia, unspecified: Secondary | ICD-10-CM | POA: Diagnosis not present

## 2019-09-25 DIAGNOSIS — D631 Anemia in chronic kidney disease: Secondary | ICD-10-CM | POA: Diagnosis not present

## 2019-09-25 DIAGNOSIS — Z992 Dependence on renal dialysis: Secondary | ICD-10-CM | POA: Diagnosis not present

## 2019-09-25 DIAGNOSIS — N186 End stage renal disease: Secondary | ICD-10-CM | POA: Diagnosis not present

## 2019-09-25 DIAGNOSIS — N2581 Secondary hyperparathyroidism of renal origin: Secondary | ICD-10-CM | POA: Diagnosis not present

## 2019-09-27 DIAGNOSIS — N186 End stage renal disease: Secondary | ICD-10-CM | POA: Diagnosis not present

## 2019-09-27 DIAGNOSIS — N039 Chronic nephritic syndrome with unspecified morphologic changes: Secondary | ICD-10-CM | POA: Diagnosis not present

## 2019-09-27 DIAGNOSIS — Z992 Dependence on renal dialysis: Secondary | ICD-10-CM | POA: Diagnosis not present

## 2019-09-28 DIAGNOSIS — Z992 Dependence on renal dialysis: Secondary | ICD-10-CM | POA: Diagnosis not present

## 2019-09-28 DIAGNOSIS — N186 End stage renal disease: Secondary | ICD-10-CM | POA: Diagnosis not present

## 2019-09-28 DIAGNOSIS — D509 Iron deficiency anemia, unspecified: Secondary | ICD-10-CM | POA: Diagnosis not present

## 2019-09-28 DIAGNOSIS — N2581 Secondary hyperparathyroidism of renal origin: Secondary | ICD-10-CM | POA: Diagnosis not present

## 2019-09-28 DIAGNOSIS — D631 Anemia in chronic kidney disease: Secondary | ICD-10-CM | POA: Diagnosis not present

## 2019-09-30 DIAGNOSIS — N2581 Secondary hyperparathyroidism of renal origin: Secondary | ICD-10-CM | POA: Diagnosis not present

## 2019-09-30 DIAGNOSIS — Z992 Dependence on renal dialysis: Secondary | ICD-10-CM | POA: Diagnosis not present

## 2019-09-30 DIAGNOSIS — D509 Iron deficiency anemia, unspecified: Secondary | ICD-10-CM | POA: Diagnosis not present

## 2019-09-30 DIAGNOSIS — D631 Anemia in chronic kidney disease: Secondary | ICD-10-CM | POA: Diagnosis not present

## 2019-09-30 DIAGNOSIS — N186 End stage renal disease: Secondary | ICD-10-CM | POA: Diagnosis not present

## 2019-10-02 DIAGNOSIS — N186 End stage renal disease: Secondary | ICD-10-CM | POA: Diagnosis not present

## 2019-10-02 DIAGNOSIS — Z992 Dependence on renal dialysis: Secondary | ICD-10-CM | POA: Diagnosis not present

## 2019-10-02 DIAGNOSIS — D509 Iron deficiency anemia, unspecified: Secondary | ICD-10-CM | POA: Diagnosis not present

## 2019-10-02 DIAGNOSIS — N2581 Secondary hyperparathyroidism of renal origin: Secondary | ICD-10-CM | POA: Diagnosis not present

## 2019-10-02 DIAGNOSIS — D631 Anemia in chronic kidney disease: Secondary | ICD-10-CM | POA: Diagnosis not present

## 2019-10-04 DIAGNOSIS — Z23 Encounter for immunization: Secondary | ICD-10-CM | POA: Diagnosis not present

## 2019-10-05 DIAGNOSIS — N186 End stage renal disease: Secondary | ICD-10-CM | POA: Diagnosis not present

## 2019-10-05 DIAGNOSIS — Z992 Dependence on renal dialysis: Secondary | ICD-10-CM | POA: Diagnosis not present

## 2019-10-05 DIAGNOSIS — D631 Anemia in chronic kidney disease: Secondary | ICD-10-CM | POA: Diagnosis not present

## 2019-10-05 DIAGNOSIS — N2581 Secondary hyperparathyroidism of renal origin: Secondary | ICD-10-CM | POA: Diagnosis not present

## 2019-10-05 DIAGNOSIS — D509 Iron deficiency anemia, unspecified: Secondary | ICD-10-CM | POA: Diagnosis not present

## 2019-10-06 ENCOUNTER — Ambulatory Visit (INDEPENDENT_AMBULATORY_CARE_PROVIDER_SITE_OTHER): Payer: Medicare Other | Admitting: *Deleted

## 2019-10-06 DIAGNOSIS — I639 Cerebral infarction, unspecified: Secondary | ICD-10-CM

## 2019-10-06 LAB — CUP PACEART REMOTE DEVICE CHECK
Date Time Interrogation Session: 20210809232631
Implantable Pulse Generator Implant Date: 20180806

## 2019-10-07 DIAGNOSIS — N2581 Secondary hyperparathyroidism of renal origin: Secondary | ICD-10-CM | POA: Diagnosis not present

## 2019-10-07 DIAGNOSIS — N186 End stage renal disease: Secondary | ICD-10-CM | POA: Diagnosis not present

## 2019-10-07 DIAGNOSIS — D631 Anemia in chronic kidney disease: Secondary | ICD-10-CM | POA: Diagnosis not present

## 2019-10-07 DIAGNOSIS — D509 Iron deficiency anemia, unspecified: Secondary | ICD-10-CM | POA: Diagnosis not present

## 2019-10-07 DIAGNOSIS — Z992 Dependence on renal dialysis: Secondary | ICD-10-CM | POA: Diagnosis not present

## 2019-10-07 NOTE — Progress Notes (Signed)
Carelink Summary Report / Loop Recorder 

## 2019-10-09 DIAGNOSIS — D631 Anemia in chronic kidney disease: Secondary | ICD-10-CM | POA: Diagnosis not present

## 2019-10-09 DIAGNOSIS — N186 End stage renal disease: Secondary | ICD-10-CM | POA: Diagnosis not present

## 2019-10-09 DIAGNOSIS — D509 Iron deficiency anemia, unspecified: Secondary | ICD-10-CM | POA: Diagnosis not present

## 2019-10-09 DIAGNOSIS — N2581 Secondary hyperparathyroidism of renal origin: Secondary | ICD-10-CM | POA: Diagnosis not present

## 2019-10-09 DIAGNOSIS — Z992 Dependence on renal dialysis: Secondary | ICD-10-CM | POA: Diagnosis not present

## 2019-10-12 DIAGNOSIS — D631 Anemia in chronic kidney disease: Secondary | ICD-10-CM | POA: Diagnosis not present

## 2019-10-12 DIAGNOSIS — Z992 Dependence on renal dialysis: Secondary | ICD-10-CM | POA: Diagnosis not present

## 2019-10-12 DIAGNOSIS — N2581 Secondary hyperparathyroidism of renal origin: Secondary | ICD-10-CM | POA: Diagnosis not present

## 2019-10-12 DIAGNOSIS — D509 Iron deficiency anemia, unspecified: Secondary | ICD-10-CM | POA: Diagnosis not present

## 2019-10-12 DIAGNOSIS — N186 End stage renal disease: Secondary | ICD-10-CM | POA: Diagnosis not present

## 2019-10-14 DIAGNOSIS — D509 Iron deficiency anemia, unspecified: Secondary | ICD-10-CM | POA: Diagnosis not present

## 2019-10-14 DIAGNOSIS — N186 End stage renal disease: Secondary | ICD-10-CM | POA: Diagnosis not present

## 2019-10-14 DIAGNOSIS — Z992 Dependence on renal dialysis: Secondary | ICD-10-CM | POA: Diagnosis not present

## 2019-10-14 DIAGNOSIS — N2581 Secondary hyperparathyroidism of renal origin: Secondary | ICD-10-CM | POA: Diagnosis not present

## 2019-10-14 DIAGNOSIS — D631 Anemia in chronic kidney disease: Secondary | ICD-10-CM | POA: Diagnosis not present

## 2019-10-16 DIAGNOSIS — N186 End stage renal disease: Secondary | ICD-10-CM | POA: Diagnosis not present

## 2019-10-16 DIAGNOSIS — Z992 Dependence on renal dialysis: Secondary | ICD-10-CM | POA: Diagnosis not present

## 2019-10-16 DIAGNOSIS — D631 Anemia in chronic kidney disease: Secondary | ICD-10-CM | POA: Diagnosis not present

## 2019-10-16 DIAGNOSIS — D509 Iron deficiency anemia, unspecified: Secondary | ICD-10-CM | POA: Diagnosis not present

## 2019-10-16 DIAGNOSIS — N2581 Secondary hyperparathyroidism of renal origin: Secondary | ICD-10-CM | POA: Diagnosis not present

## 2019-10-19 DIAGNOSIS — Z992 Dependence on renal dialysis: Secondary | ICD-10-CM | POA: Diagnosis not present

## 2019-10-19 DIAGNOSIS — D631 Anemia in chronic kidney disease: Secondary | ICD-10-CM | POA: Diagnosis not present

## 2019-10-19 DIAGNOSIS — N186 End stage renal disease: Secondary | ICD-10-CM | POA: Diagnosis not present

## 2019-10-19 DIAGNOSIS — N2581 Secondary hyperparathyroidism of renal origin: Secondary | ICD-10-CM | POA: Diagnosis not present

## 2019-10-19 DIAGNOSIS — D509 Iron deficiency anemia, unspecified: Secondary | ICD-10-CM | POA: Diagnosis not present

## 2019-10-21 DIAGNOSIS — Z992 Dependence on renal dialysis: Secondary | ICD-10-CM | POA: Diagnosis not present

## 2019-10-21 DIAGNOSIS — N2581 Secondary hyperparathyroidism of renal origin: Secondary | ICD-10-CM | POA: Diagnosis not present

## 2019-10-21 DIAGNOSIS — D631 Anemia in chronic kidney disease: Secondary | ICD-10-CM | POA: Diagnosis not present

## 2019-10-21 DIAGNOSIS — D509 Iron deficiency anemia, unspecified: Secondary | ICD-10-CM | POA: Diagnosis not present

## 2019-10-21 DIAGNOSIS — N186 End stage renal disease: Secondary | ICD-10-CM | POA: Diagnosis not present

## 2019-10-23 DIAGNOSIS — D631 Anemia in chronic kidney disease: Secondary | ICD-10-CM | POA: Diagnosis not present

## 2019-10-23 DIAGNOSIS — D509 Iron deficiency anemia, unspecified: Secondary | ICD-10-CM | POA: Diagnosis not present

## 2019-10-23 DIAGNOSIS — N186 End stage renal disease: Secondary | ICD-10-CM | POA: Diagnosis not present

## 2019-10-23 DIAGNOSIS — N2581 Secondary hyperparathyroidism of renal origin: Secondary | ICD-10-CM | POA: Diagnosis not present

## 2019-10-23 DIAGNOSIS — Z992 Dependence on renal dialysis: Secondary | ICD-10-CM | POA: Diagnosis not present

## 2019-10-26 DIAGNOSIS — D509 Iron deficiency anemia, unspecified: Secondary | ICD-10-CM | POA: Diagnosis not present

## 2019-10-26 DIAGNOSIS — N186 End stage renal disease: Secondary | ICD-10-CM | POA: Diagnosis not present

## 2019-10-26 DIAGNOSIS — D631 Anemia in chronic kidney disease: Secondary | ICD-10-CM | POA: Diagnosis not present

## 2019-10-26 DIAGNOSIS — Z992 Dependence on renal dialysis: Secondary | ICD-10-CM | POA: Diagnosis not present

## 2019-10-26 DIAGNOSIS — N2581 Secondary hyperparathyroidism of renal origin: Secondary | ICD-10-CM | POA: Diagnosis not present

## 2019-10-28 DIAGNOSIS — N2581 Secondary hyperparathyroidism of renal origin: Secondary | ICD-10-CM | POA: Diagnosis not present

## 2019-10-28 DIAGNOSIS — D509 Iron deficiency anemia, unspecified: Secondary | ICD-10-CM | POA: Diagnosis not present

## 2019-10-28 DIAGNOSIS — Z992 Dependence on renal dialysis: Secondary | ICD-10-CM | POA: Diagnosis not present

## 2019-10-28 DIAGNOSIS — N186 End stage renal disease: Secondary | ICD-10-CM | POA: Diagnosis not present

## 2019-10-28 DIAGNOSIS — D631 Anemia in chronic kidney disease: Secondary | ICD-10-CM | POA: Diagnosis not present

## 2019-10-28 DIAGNOSIS — N039 Chronic nephritic syndrome with unspecified morphologic changes: Secondary | ICD-10-CM | POA: Diagnosis not present

## 2019-10-30 DIAGNOSIS — D509 Iron deficiency anemia, unspecified: Secondary | ICD-10-CM | POA: Diagnosis not present

## 2019-10-30 DIAGNOSIS — N186 End stage renal disease: Secondary | ICD-10-CM | POA: Diagnosis not present

## 2019-10-30 DIAGNOSIS — D631 Anemia in chronic kidney disease: Secondary | ICD-10-CM | POA: Diagnosis not present

## 2019-10-30 DIAGNOSIS — Z992 Dependence on renal dialysis: Secondary | ICD-10-CM | POA: Diagnosis not present

## 2019-10-30 DIAGNOSIS — N2581 Secondary hyperparathyroidism of renal origin: Secondary | ICD-10-CM | POA: Diagnosis not present

## 2019-11-02 DIAGNOSIS — N2581 Secondary hyperparathyroidism of renal origin: Secondary | ICD-10-CM | POA: Diagnosis not present

## 2019-11-02 DIAGNOSIS — D631 Anemia in chronic kidney disease: Secondary | ICD-10-CM | POA: Diagnosis not present

## 2019-11-02 DIAGNOSIS — N186 End stage renal disease: Secondary | ICD-10-CM | POA: Diagnosis not present

## 2019-11-02 DIAGNOSIS — D509 Iron deficiency anemia, unspecified: Secondary | ICD-10-CM | POA: Diagnosis not present

## 2019-11-02 DIAGNOSIS — Z992 Dependence on renal dialysis: Secondary | ICD-10-CM | POA: Diagnosis not present

## 2019-11-04 DIAGNOSIS — N2581 Secondary hyperparathyroidism of renal origin: Secondary | ICD-10-CM | POA: Diagnosis not present

## 2019-11-04 DIAGNOSIS — D631 Anemia in chronic kidney disease: Secondary | ICD-10-CM | POA: Diagnosis not present

## 2019-11-04 DIAGNOSIS — D509 Iron deficiency anemia, unspecified: Secondary | ICD-10-CM | POA: Diagnosis not present

## 2019-11-04 DIAGNOSIS — N186 End stage renal disease: Secondary | ICD-10-CM | POA: Diagnosis not present

## 2019-11-04 DIAGNOSIS — Z992 Dependence on renal dialysis: Secondary | ICD-10-CM | POA: Diagnosis not present

## 2019-11-05 DIAGNOSIS — I871 Compression of vein: Secondary | ICD-10-CM | POA: Diagnosis not present

## 2019-11-05 DIAGNOSIS — T82858A Stenosis of vascular prosthetic devices, implants and grafts, initial encounter: Secondary | ICD-10-CM | POA: Diagnosis not present

## 2019-11-05 DIAGNOSIS — Z992 Dependence on renal dialysis: Secondary | ICD-10-CM | POA: Diagnosis not present

## 2019-11-05 DIAGNOSIS — N186 End stage renal disease: Secondary | ICD-10-CM | POA: Diagnosis not present

## 2019-11-06 DIAGNOSIS — N2581 Secondary hyperparathyroidism of renal origin: Secondary | ICD-10-CM | POA: Diagnosis not present

## 2019-11-06 DIAGNOSIS — Z992 Dependence on renal dialysis: Secondary | ICD-10-CM | POA: Diagnosis not present

## 2019-11-06 DIAGNOSIS — D509 Iron deficiency anemia, unspecified: Secondary | ICD-10-CM | POA: Diagnosis not present

## 2019-11-06 DIAGNOSIS — D631 Anemia in chronic kidney disease: Secondary | ICD-10-CM | POA: Diagnosis not present

## 2019-11-06 DIAGNOSIS — N186 End stage renal disease: Secondary | ICD-10-CM | POA: Diagnosis not present

## 2019-11-08 ENCOUNTER — Ambulatory Visit: Payer: Medicare Other

## 2019-11-09 DIAGNOSIS — D631 Anemia in chronic kidney disease: Secondary | ICD-10-CM | POA: Diagnosis not present

## 2019-11-09 DIAGNOSIS — N186 End stage renal disease: Secondary | ICD-10-CM | POA: Diagnosis not present

## 2019-11-09 DIAGNOSIS — N2581 Secondary hyperparathyroidism of renal origin: Secondary | ICD-10-CM | POA: Diagnosis not present

## 2019-11-09 DIAGNOSIS — Z992 Dependence on renal dialysis: Secondary | ICD-10-CM | POA: Diagnosis not present

## 2019-11-09 DIAGNOSIS — D509 Iron deficiency anemia, unspecified: Secondary | ICD-10-CM | POA: Diagnosis not present

## 2019-11-09 LAB — CUP PACEART REMOTE DEVICE CHECK
Date Time Interrogation Session: 20210911233021
Implantable Pulse Generator Implant Date: 20180806

## 2019-11-10 DIAGNOSIS — D631 Anemia in chronic kidney disease: Secondary | ICD-10-CM | POA: Diagnosis not present

## 2019-11-10 DIAGNOSIS — E669 Obesity, unspecified: Secondary | ICD-10-CM | POA: Diagnosis not present

## 2019-11-10 DIAGNOSIS — N2581 Secondary hyperparathyroidism of renal origin: Secondary | ICD-10-CM | POA: Diagnosis not present

## 2019-11-10 DIAGNOSIS — Z0000 Encounter for general adult medical examination without abnormal findings: Secondary | ICD-10-CM | POA: Diagnosis not present

## 2019-11-10 DIAGNOSIS — N049 Nephrotic syndrome with unspecified morphologic changes: Secondary | ICD-10-CM | POA: Diagnosis not present

## 2019-11-10 DIAGNOSIS — R2681 Unsteadiness on feet: Secondary | ICD-10-CM | POA: Diagnosis not present

## 2019-11-10 DIAGNOSIS — I770 Arteriovenous fistula, acquired: Secondary | ICD-10-CM | POA: Diagnosis not present

## 2019-11-10 DIAGNOSIS — Z008 Encounter for other general examination: Secondary | ICD-10-CM | POA: Diagnosis not present

## 2019-11-10 DIAGNOSIS — Z6834 Body mass index (BMI) 34.0-34.9, adult: Secondary | ICD-10-CM | POA: Diagnosis not present

## 2019-11-10 DIAGNOSIS — K219 Gastro-esophageal reflux disease without esophagitis: Secondary | ICD-10-CM | POA: Diagnosis not present

## 2019-11-10 DIAGNOSIS — N186 End stage renal disease: Secondary | ICD-10-CM | POA: Diagnosis not present

## 2019-11-10 DIAGNOSIS — I700 Atherosclerosis of aorta: Secondary | ICD-10-CM | POA: Diagnosis not present

## 2019-11-11 DIAGNOSIS — D631 Anemia in chronic kidney disease: Secondary | ICD-10-CM | POA: Diagnosis not present

## 2019-11-11 DIAGNOSIS — Z992 Dependence on renal dialysis: Secondary | ICD-10-CM | POA: Diagnosis not present

## 2019-11-11 DIAGNOSIS — N2581 Secondary hyperparathyroidism of renal origin: Secondary | ICD-10-CM | POA: Diagnosis not present

## 2019-11-11 DIAGNOSIS — N186 End stage renal disease: Secondary | ICD-10-CM | POA: Diagnosis not present

## 2019-11-11 DIAGNOSIS — D509 Iron deficiency anemia, unspecified: Secondary | ICD-10-CM | POA: Diagnosis not present

## 2019-11-13 DIAGNOSIS — D509 Iron deficiency anemia, unspecified: Secondary | ICD-10-CM | POA: Diagnosis not present

## 2019-11-13 DIAGNOSIS — D631 Anemia in chronic kidney disease: Secondary | ICD-10-CM | POA: Diagnosis not present

## 2019-11-13 DIAGNOSIS — Z992 Dependence on renal dialysis: Secondary | ICD-10-CM | POA: Diagnosis not present

## 2019-11-13 DIAGNOSIS — N186 End stage renal disease: Secondary | ICD-10-CM | POA: Diagnosis not present

## 2019-11-13 DIAGNOSIS — N2581 Secondary hyperparathyroidism of renal origin: Secondary | ICD-10-CM | POA: Diagnosis not present

## 2019-11-16 DIAGNOSIS — N2581 Secondary hyperparathyroidism of renal origin: Secondary | ICD-10-CM | POA: Diagnosis not present

## 2019-11-16 DIAGNOSIS — D631 Anemia in chronic kidney disease: Secondary | ICD-10-CM | POA: Diagnosis not present

## 2019-11-16 DIAGNOSIS — Z992 Dependence on renal dialysis: Secondary | ICD-10-CM | POA: Diagnosis not present

## 2019-11-16 DIAGNOSIS — D509 Iron deficiency anemia, unspecified: Secondary | ICD-10-CM | POA: Diagnosis not present

## 2019-11-16 DIAGNOSIS — N186 End stage renal disease: Secondary | ICD-10-CM | POA: Diagnosis not present

## 2019-11-18 DIAGNOSIS — Z992 Dependence on renal dialysis: Secondary | ICD-10-CM | POA: Diagnosis not present

## 2019-11-18 DIAGNOSIS — N2581 Secondary hyperparathyroidism of renal origin: Secondary | ICD-10-CM | POA: Diagnosis not present

## 2019-11-18 DIAGNOSIS — N186 End stage renal disease: Secondary | ICD-10-CM | POA: Diagnosis not present

## 2019-11-18 DIAGNOSIS — D631 Anemia in chronic kidney disease: Secondary | ICD-10-CM | POA: Diagnosis not present

## 2019-11-18 DIAGNOSIS — D509 Iron deficiency anemia, unspecified: Secondary | ICD-10-CM | POA: Diagnosis not present

## 2019-11-20 DIAGNOSIS — D631 Anemia in chronic kidney disease: Secondary | ICD-10-CM | POA: Diagnosis not present

## 2019-11-20 DIAGNOSIS — N186 End stage renal disease: Secondary | ICD-10-CM | POA: Diagnosis not present

## 2019-11-20 DIAGNOSIS — N2581 Secondary hyperparathyroidism of renal origin: Secondary | ICD-10-CM | POA: Diagnosis not present

## 2019-11-20 DIAGNOSIS — Z992 Dependence on renal dialysis: Secondary | ICD-10-CM | POA: Diagnosis not present

## 2019-11-20 DIAGNOSIS — D509 Iron deficiency anemia, unspecified: Secondary | ICD-10-CM | POA: Diagnosis not present

## 2019-11-23 DIAGNOSIS — N186 End stage renal disease: Secondary | ICD-10-CM | POA: Diagnosis not present

## 2019-11-23 DIAGNOSIS — D631 Anemia in chronic kidney disease: Secondary | ICD-10-CM | POA: Diagnosis not present

## 2019-11-23 DIAGNOSIS — N2581 Secondary hyperparathyroidism of renal origin: Secondary | ICD-10-CM | POA: Diagnosis not present

## 2019-11-23 DIAGNOSIS — D509 Iron deficiency anemia, unspecified: Secondary | ICD-10-CM | POA: Diagnosis not present

## 2019-11-23 DIAGNOSIS — Z992 Dependence on renal dialysis: Secondary | ICD-10-CM | POA: Diagnosis not present

## 2019-11-25 DIAGNOSIS — N2581 Secondary hyperparathyroidism of renal origin: Secondary | ICD-10-CM | POA: Diagnosis not present

## 2019-11-25 DIAGNOSIS — Z992 Dependence on renal dialysis: Secondary | ICD-10-CM | POA: Diagnosis not present

## 2019-11-25 DIAGNOSIS — D509 Iron deficiency anemia, unspecified: Secondary | ICD-10-CM | POA: Diagnosis not present

## 2019-11-25 DIAGNOSIS — D631 Anemia in chronic kidney disease: Secondary | ICD-10-CM | POA: Diagnosis not present

## 2019-11-25 DIAGNOSIS — N186 End stage renal disease: Secondary | ICD-10-CM | POA: Diagnosis not present

## 2019-11-26 ENCOUNTER — Encounter: Payer: Medicare Other | Admitting: Obstetrics & Gynecology

## 2019-11-27 DIAGNOSIS — N2581 Secondary hyperparathyroidism of renal origin: Secondary | ICD-10-CM | POA: Diagnosis not present

## 2019-11-27 DIAGNOSIS — Z992 Dependence on renal dialysis: Secondary | ICD-10-CM | POA: Diagnosis not present

## 2019-11-27 DIAGNOSIS — N039 Chronic nephritic syndrome with unspecified morphologic changes: Secondary | ICD-10-CM | POA: Diagnosis not present

## 2019-11-27 DIAGNOSIS — N186 End stage renal disease: Secondary | ICD-10-CM | POA: Diagnosis not present

## 2019-11-27 DIAGNOSIS — D509 Iron deficiency anemia, unspecified: Secondary | ICD-10-CM | POA: Diagnosis not present

## 2019-11-27 DIAGNOSIS — D631 Anemia in chronic kidney disease: Secondary | ICD-10-CM | POA: Diagnosis not present

## 2019-11-30 DIAGNOSIS — Z992 Dependence on renal dialysis: Secondary | ICD-10-CM | POA: Diagnosis not present

## 2019-11-30 DIAGNOSIS — D509 Iron deficiency anemia, unspecified: Secondary | ICD-10-CM | POA: Diagnosis not present

## 2019-11-30 DIAGNOSIS — D631 Anemia in chronic kidney disease: Secondary | ICD-10-CM | POA: Diagnosis not present

## 2019-11-30 DIAGNOSIS — N186 End stage renal disease: Secondary | ICD-10-CM | POA: Diagnosis not present

## 2019-11-30 DIAGNOSIS — N2581 Secondary hyperparathyroidism of renal origin: Secondary | ICD-10-CM | POA: Diagnosis not present

## 2019-12-02 DIAGNOSIS — Z992 Dependence on renal dialysis: Secondary | ICD-10-CM | POA: Diagnosis not present

## 2019-12-02 DIAGNOSIS — D509 Iron deficiency anemia, unspecified: Secondary | ICD-10-CM | POA: Diagnosis not present

## 2019-12-02 DIAGNOSIS — N2581 Secondary hyperparathyroidism of renal origin: Secondary | ICD-10-CM | POA: Diagnosis not present

## 2019-12-02 DIAGNOSIS — D631 Anemia in chronic kidney disease: Secondary | ICD-10-CM | POA: Diagnosis not present

## 2019-12-02 DIAGNOSIS — N186 End stage renal disease: Secondary | ICD-10-CM | POA: Diagnosis not present

## 2019-12-03 ENCOUNTER — Other Ambulatory Visit: Payer: Self-pay

## 2019-12-03 ENCOUNTER — Ambulatory Visit (INDEPENDENT_AMBULATORY_CARE_PROVIDER_SITE_OTHER): Payer: Medicare Other | Admitting: Obstetrics & Gynecology

## 2019-12-03 ENCOUNTER — Encounter: Payer: Self-pay | Admitting: Obstetrics & Gynecology

## 2019-12-03 VITALS — BP 110/70 | Ht 60.0 in | Wt 171.0 lb

## 2019-12-03 DIAGNOSIS — Z992 Dependence on renal dialysis: Secondary | ICD-10-CM | POA: Diagnosis not present

## 2019-12-03 DIAGNOSIS — M8589 Other specified disorders of bone density and structure, multiple sites: Secondary | ICD-10-CM

## 2019-12-03 DIAGNOSIS — N186 End stage renal disease: Secondary | ICD-10-CM | POA: Diagnosis not present

## 2019-12-03 DIAGNOSIS — Z780 Asymptomatic menopausal state: Secondary | ICD-10-CM | POA: Diagnosis not present

## 2019-12-03 DIAGNOSIS — N898 Other specified noninflammatory disorders of vagina: Secondary | ICD-10-CM

## 2019-12-03 DIAGNOSIS — Z01419 Encounter for gynecological examination (general) (routine) without abnormal findings: Secondary | ICD-10-CM

## 2019-12-03 LAB — WET PREP FOR TRICH, YEAST, CLUE

## 2019-12-03 MED ORDER — FLUCONAZOLE 150 MG PO TABS: 150.0000 mg | ORAL_TABLET | Freq: Every day | ORAL | 2 refills | Status: AC

## 2019-12-03 NOTE — Addendum Note (Signed)
Addended by: Lorine Bears on: 12/03/2019 03:21 PM   Modules accepted: Orders

## 2019-12-03 NOTE — Progress Notes (Signed)
[name] Marks November 26, 1955 [id number]   History:    64 y.o. G2P2L2 Widowed.  4 grand-children.  RP:  Estalbished patient presenting for annual gyn exam   HPI: Postmenopause, well on no HRT.  No PMB.  H/O Simple Endometrial Hyperplasia on Megace.  EBx benign 02/2018, Megace stopped. Pelvic US 09/2018 showed a thin Endometrial line at 2.6 mm.  No pelvic pain.  Abstinent.  Urine/BMs normal.  Breasts normal.  BMI 33.4.  Needs to exercise more.  Health labs with Fam MD.  Harriet Masson 2016.  Kidney failure on Hemodialysis 3x a week.   Past medical history,surgical history, family history and social history were all reviewed and documented in the EPIC chart.  Gynecologic History No LMP recorded. Patient is postmenopausal.  Obstetric History OB History  Gravida Para Term Preterm AB Living  2 2       2   SAB TAB Ectopic Multiple Live Births               # Outcome Date GA Lbr Len/2nd Weight Sex Delivery Anes PTL Lv  2 Para           1 Para              ROS: A ROS was performed and pertinent positives and negatives are included in the history.  GENERAL: No fevers or chills. HEENT: No change in vision, no earache, sore throat or sinus congestion. NECK: No pain or stiffness. CARDIOVASCULAR: No chest pain or pressure. No palpitations. PULMONARY: No shortness of breath, cough or wheeze. GASTROINTESTINAL: No abdominal pain, nausea, vomiting or diarrhea, melena or bright red blood per rectum. GENITOURINARY: No urinary frequency, urgency, hesitancy or dysuria. MUSCULOSKELETAL: No joint or muscle pain, no back pain, no recent trauma. DERMATOLOGIC: No rash, no itching, no lesions. ENDOCRINE: No polyuria, polydipsia, no heat or cold intolerance. No recent change in weight. HEMATOLOGICAL: No anemia or easy bruising or bleeding. NEUROLOGIC: No headache, seizures, numbness, tingling or weakness. PSYCHIATRIC: No depression, no loss of interest in normal activity or change in sleep pattern.     Exam:   BP  110/70 (BP Location: Right Arm, Patient Position: Sitting, Cuff Size: Normal)   Ht 5' (1.524 m)   Wt 171 lb (77.6 kg)   BMI 33.40 kg/m   Body mass index is 33.4 kg/m.  General appearance : Well developed well nourished female. No acute distress HEENT: Eyes: no retinal hemorrhage or exudates,  Neck supple, trachea midline, no carotid bruits, no thyroidmegaly Lungs: Clear to auscultation, no rhonchi or wheezes, or rib retractions  Heart: Regular rate and rhythm, no murmurs or gallops Breast:Examined in sitting and supine position were symmetrical in appearance, no palpable masses or tenderness,  no skin retraction, no nipple inversion, no nipple discharge, no skin discoloration, no axillary or supraclavicular lymphadenopathy Abdomen: no palpable masses or tenderness, no rebound or guarding Extremities: no edema or skin discoloration or tenderness  Pelvic: Vulva: Normal             Vagina: No gross lesions.  Increased discharge.  Wet prep done.  Cervix: No gross lesions or discharge.  Pap reflex done.  Uterus  AV, normal size, shape and consistency, non-tender and mobile  Adnexa  Without masses or tenderness  Anus: Normal  Wet prep:  Yeasts present   Assessment/Plan:  64 y.o. female for annual exam   1. Encounter for routine gynecological examination with Papanicolaou smear of cervix Normal gynecologic exam postmenopausal.  Pap test done.  Breast exam normal.  Will schedule a screening mammogram now.  Colonoscopy in 2016.  Health labs with family physician.  Body mass index 33.4.  Continue with fitness and healthy nutrition.  2. Postmenopause Well on no hormone replacement therapy.  No postmenopausal bleeding.  3. Osteopenia of multiple sites Osteopenia at all sites on bone density September 2020.  Continue with vitamin D supplements, calcium intake of 1200 mg daily and regular weightbearing physical activities in the form of walking.  4. Vaginal discharge Yeast vaginitis  confirmed by wet prep.  Will treat with fluconazole 1 tablet per mouth daily for 3 days.  Prescription sent with refills. - WET PREP FOR [place], YEAST, CLUE  5. ESRD (end stage renal disease) on dialysis [facility]) Should start work-up for kidney transplant soon.  Other orders - fluconazole (DIFLUCAN) 150 MG tablet; Take 1 tablet (150 mg total) by mouth daily for 3 days.  Princess Bruins MD, 2:11 PM 12/03/2019

## 2019-12-04 DIAGNOSIS — D509 Iron deficiency anemia, unspecified: Secondary | ICD-10-CM | POA: Diagnosis not present

## 2019-12-04 DIAGNOSIS — N186 End stage renal disease: Secondary | ICD-10-CM | POA: Diagnosis not present

## 2019-12-04 DIAGNOSIS — N2581 Secondary hyperparathyroidism of renal origin: Secondary | ICD-10-CM | POA: Diagnosis not present

## 2019-12-04 DIAGNOSIS — D631 Anemia in chronic kidney disease: Secondary | ICD-10-CM | POA: Diagnosis not present

## 2019-12-04 DIAGNOSIS — Z992 Dependence on renal dialysis: Secondary | ICD-10-CM | POA: Diagnosis not present

## 2019-12-04 LAB — PAP IG W/ RFLX HPV ASCU

## 2019-12-07 DIAGNOSIS — D631 Anemia in chronic kidney disease: Secondary | ICD-10-CM | POA: Diagnosis not present

## 2019-12-07 DIAGNOSIS — D509 Iron deficiency anemia, unspecified: Secondary | ICD-10-CM | POA: Diagnosis not present

## 2019-12-07 DIAGNOSIS — N2581 Secondary hyperparathyroidism of renal origin: Secondary | ICD-10-CM | POA: Diagnosis not present

## 2019-12-07 DIAGNOSIS — N186 End stage renal disease: Secondary | ICD-10-CM | POA: Diagnosis not present

## 2019-12-07 DIAGNOSIS — Z992 Dependence on renal dialysis: Secondary | ICD-10-CM | POA: Diagnosis not present

## 2019-12-09 DIAGNOSIS — N2581 Secondary hyperparathyroidism of renal origin: Secondary | ICD-10-CM | POA: Diagnosis not present

## 2019-12-09 DIAGNOSIS — D509 Iron deficiency anemia, unspecified: Secondary | ICD-10-CM | POA: Diagnosis not present

## 2019-12-09 DIAGNOSIS — D631 Anemia in chronic kidney disease: Secondary | ICD-10-CM | POA: Diagnosis not present

## 2019-12-09 DIAGNOSIS — Z992 Dependence on renal dialysis: Secondary | ICD-10-CM | POA: Diagnosis not present

## 2019-12-09 DIAGNOSIS — N186 End stage renal disease: Secondary | ICD-10-CM | POA: Diagnosis not present

## 2019-12-11 ENCOUNTER — Ambulatory Visit (INDEPENDENT_AMBULATORY_CARE_PROVIDER_SITE_OTHER): Payer: Medicare Other

## 2019-12-11 DIAGNOSIS — I639 Cerebral infarction, unspecified: Secondary | ICD-10-CM

## 2019-12-11 DIAGNOSIS — D509 Iron deficiency anemia, unspecified: Secondary | ICD-10-CM | POA: Diagnosis not present

## 2019-12-11 DIAGNOSIS — N2581 Secondary hyperparathyroidism of renal origin: Secondary | ICD-10-CM | POA: Diagnosis not present

## 2019-12-11 DIAGNOSIS — N186 End stage renal disease: Secondary | ICD-10-CM | POA: Diagnosis not present

## 2019-12-11 DIAGNOSIS — Z992 Dependence on renal dialysis: Secondary | ICD-10-CM | POA: Diagnosis not present

## 2019-12-11 DIAGNOSIS — D631 Anemia in chronic kidney disease: Secondary | ICD-10-CM | POA: Diagnosis not present

## 2019-12-12 LAB — CUP PACEART REMOTE DEVICE CHECK
Date Time Interrogation Session: 20211014233602
Implantable Pulse Generator Implant Date: 20180806

## 2019-12-14 DIAGNOSIS — D509 Iron deficiency anemia, unspecified: Secondary | ICD-10-CM | POA: Diagnosis not present

## 2019-12-14 DIAGNOSIS — D631 Anemia in chronic kidney disease: Secondary | ICD-10-CM | POA: Diagnosis not present

## 2019-12-14 DIAGNOSIS — Z992 Dependence on renal dialysis: Secondary | ICD-10-CM | POA: Diagnosis not present

## 2019-12-14 DIAGNOSIS — N2581 Secondary hyperparathyroidism of renal origin: Secondary | ICD-10-CM | POA: Diagnosis not present

## 2019-12-14 DIAGNOSIS — N186 End stage renal disease: Secondary | ICD-10-CM | POA: Diagnosis not present

## 2019-12-16 DIAGNOSIS — D631 Anemia in chronic kidney disease: Secondary | ICD-10-CM | POA: Diagnosis not present

## 2019-12-16 DIAGNOSIS — Z992 Dependence on renal dialysis: Secondary | ICD-10-CM | POA: Diagnosis not present

## 2019-12-16 DIAGNOSIS — D509 Iron deficiency anemia, unspecified: Secondary | ICD-10-CM | POA: Diagnosis not present

## 2019-12-16 DIAGNOSIS — N186 End stage renal disease: Secondary | ICD-10-CM | POA: Diagnosis not present

## 2019-12-16 DIAGNOSIS — N2581 Secondary hyperparathyroidism of renal origin: Secondary | ICD-10-CM | POA: Diagnosis not present

## 2019-12-16 NOTE — Progress Notes (Signed)
Carelink Summary Report / Loop Recorder 

## 2019-12-18 DIAGNOSIS — N2581 Secondary hyperparathyroidism of renal origin: Secondary | ICD-10-CM | POA: Diagnosis not present

## 2019-12-18 DIAGNOSIS — N186 End stage renal disease: Secondary | ICD-10-CM | POA: Diagnosis not present

## 2019-12-18 DIAGNOSIS — Z992 Dependence on renal dialysis: Secondary | ICD-10-CM | POA: Diagnosis not present

## 2019-12-18 DIAGNOSIS — D509 Iron deficiency anemia, unspecified: Secondary | ICD-10-CM | POA: Diagnosis not present

## 2019-12-18 DIAGNOSIS — D631 Anemia in chronic kidney disease: Secondary | ICD-10-CM | POA: Diagnosis not present

## 2019-12-21 DIAGNOSIS — D509 Iron deficiency anemia, unspecified: Secondary | ICD-10-CM | POA: Diagnosis not present

## 2019-12-21 DIAGNOSIS — N186 End stage renal disease: Secondary | ICD-10-CM | POA: Diagnosis not present

## 2019-12-21 DIAGNOSIS — Z992 Dependence on renal dialysis: Secondary | ICD-10-CM | POA: Diagnosis not present

## 2019-12-21 DIAGNOSIS — N2581 Secondary hyperparathyroidism of renal origin: Secondary | ICD-10-CM | POA: Diagnosis not present

## 2019-12-21 DIAGNOSIS — D631 Anemia in chronic kidney disease: Secondary | ICD-10-CM | POA: Diagnosis not present

## 2019-12-22 ENCOUNTER — Other Ambulatory Visit: Payer: Self-pay | Admitting: Family Medicine

## 2019-12-22 DIAGNOSIS — Z1231 Encounter for screening mammogram for malignant neoplasm of breast: Secondary | ICD-10-CM

## 2019-12-23 DIAGNOSIS — Z992 Dependence on renal dialysis: Secondary | ICD-10-CM | POA: Diagnosis not present

## 2019-12-23 DIAGNOSIS — D631 Anemia in chronic kidney disease: Secondary | ICD-10-CM | POA: Diagnosis not present

## 2019-12-23 DIAGNOSIS — D509 Iron deficiency anemia, unspecified: Secondary | ICD-10-CM | POA: Diagnosis not present

## 2019-12-23 DIAGNOSIS — N186 End stage renal disease: Secondary | ICD-10-CM | POA: Diagnosis not present

## 2019-12-23 DIAGNOSIS — N2581 Secondary hyperparathyroidism of renal origin: Secondary | ICD-10-CM | POA: Diagnosis not present

## 2019-12-24 ENCOUNTER — Ambulatory Visit
Admission: RE | Admit: 2019-12-24 | Discharge: 2019-12-24 | Disposition: A | Discharge status: Home / Self Care | Payer: Medicare Other | Source: Ambulatory Visit | Attending: *Deleted | Admitting: *Deleted

## 2019-12-24 ENCOUNTER — Other Ambulatory Visit: Payer: Self-pay

## 2019-12-24 DIAGNOSIS — Z1231 Encounter for screening mammogram for malignant neoplasm of breast: Secondary | ICD-10-CM

## 2019-12-25 DIAGNOSIS — N186 End stage renal disease: Secondary | ICD-10-CM | POA: Diagnosis not present

## 2019-12-25 DIAGNOSIS — D631 Anemia in chronic kidney disease: Secondary | ICD-10-CM | POA: Diagnosis not present

## 2019-12-25 DIAGNOSIS — N2581 Secondary hyperparathyroidism of renal origin: Secondary | ICD-10-CM | POA: Diagnosis not present

## 2019-12-25 DIAGNOSIS — D509 Iron deficiency anemia, unspecified: Secondary | ICD-10-CM | POA: Diagnosis not present

## 2019-12-25 DIAGNOSIS — Z992 Dependence on renal dialysis: Secondary | ICD-10-CM | POA: Diagnosis not present

## 2019-12-28 DIAGNOSIS — N186 End stage renal disease: Secondary | ICD-10-CM | POA: Diagnosis not present

## 2019-12-28 DIAGNOSIS — N039 Chronic nephritic syndrome with unspecified morphologic changes: Secondary | ICD-10-CM | POA: Diagnosis not present

## 2019-12-28 DIAGNOSIS — Z992 Dependence on renal dialysis: Secondary | ICD-10-CM | POA: Diagnosis not present

## 2019-12-28 DIAGNOSIS — D631 Anemia in chronic kidney disease: Secondary | ICD-10-CM | POA: Diagnosis not present

## 2019-12-28 DIAGNOSIS — D509 Iron deficiency anemia, unspecified: Secondary | ICD-10-CM | POA: Diagnosis not present

## 2019-12-28 DIAGNOSIS — N2581 Secondary hyperparathyroidism of renal origin: Secondary | ICD-10-CM | POA: Diagnosis not present

## 2019-12-30 DIAGNOSIS — D509 Iron deficiency anemia, unspecified: Secondary | ICD-10-CM | POA: Diagnosis not present

## 2019-12-30 DIAGNOSIS — D631 Anemia in chronic kidney disease: Secondary | ICD-10-CM | POA: Diagnosis not present

## 2019-12-30 DIAGNOSIS — Z992 Dependence on renal dialysis: Secondary | ICD-10-CM | POA: Diagnosis not present

## 2019-12-30 DIAGNOSIS — N2581 Secondary hyperparathyroidism of renal origin: Secondary | ICD-10-CM | POA: Diagnosis not present

## 2019-12-30 DIAGNOSIS — N186 End stage renal disease: Secondary | ICD-10-CM | POA: Diagnosis not present

## 2020-01-01 DIAGNOSIS — D631 Anemia in chronic kidney disease: Secondary | ICD-10-CM | POA: Diagnosis not present

## 2020-01-01 DIAGNOSIS — N2581 Secondary hyperparathyroidism of renal origin: Secondary | ICD-10-CM | POA: Diagnosis not present

## 2020-01-01 DIAGNOSIS — Z992 Dependence on renal dialysis: Secondary | ICD-10-CM | POA: Diagnosis not present

## 2020-01-01 DIAGNOSIS — D509 Iron deficiency anemia, unspecified: Secondary | ICD-10-CM | POA: Diagnosis not present

## 2020-01-01 DIAGNOSIS — N186 End stage renal disease: Secondary | ICD-10-CM | POA: Diagnosis not present

## 2020-01-04 DIAGNOSIS — N2581 Secondary hyperparathyroidism of renal origin: Secondary | ICD-10-CM | POA: Diagnosis not present

## 2020-01-04 DIAGNOSIS — N186 End stage renal disease: Secondary | ICD-10-CM | POA: Diagnosis not present

## 2020-01-04 DIAGNOSIS — D631 Anemia in chronic kidney disease: Secondary | ICD-10-CM | POA: Diagnosis not present

## 2020-01-04 DIAGNOSIS — Z992 Dependence on renal dialysis: Secondary | ICD-10-CM | POA: Diagnosis not present

## 2020-01-04 DIAGNOSIS — D509 Iron deficiency anemia, unspecified: Secondary | ICD-10-CM | POA: Diagnosis not present

## 2020-01-06 DIAGNOSIS — N186 End stage renal disease: Secondary | ICD-10-CM | POA: Diagnosis not present

## 2020-01-06 DIAGNOSIS — Z992 Dependence on renal dialysis: Secondary | ICD-10-CM | POA: Diagnosis not present

## 2020-01-06 DIAGNOSIS — D631 Anemia in chronic kidney disease: Secondary | ICD-10-CM | POA: Diagnosis not present

## 2020-01-06 DIAGNOSIS — D509 Iron deficiency anemia, unspecified: Secondary | ICD-10-CM | POA: Diagnosis not present

## 2020-01-06 DIAGNOSIS — N2581 Secondary hyperparathyroidism of renal origin: Secondary | ICD-10-CM | POA: Diagnosis not present

## 2020-01-08 DIAGNOSIS — Z992 Dependence on renal dialysis: Secondary | ICD-10-CM | POA: Diagnosis not present

## 2020-01-08 DIAGNOSIS — N2581 Secondary hyperparathyroidism of renal origin: Secondary | ICD-10-CM | POA: Diagnosis not present

## 2020-01-08 DIAGNOSIS — N186 End stage renal disease: Secondary | ICD-10-CM | POA: Diagnosis not present

## 2020-01-08 DIAGNOSIS — D509 Iron deficiency anemia, unspecified: Secondary | ICD-10-CM | POA: Diagnosis not present

## 2020-01-08 DIAGNOSIS — D631 Anemia in chronic kidney disease: Secondary | ICD-10-CM | POA: Diagnosis not present

## 2020-01-11 DIAGNOSIS — N186 End stage renal disease: Secondary | ICD-10-CM | POA: Diagnosis not present

## 2020-01-11 DIAGNOSIS — Z992 Dependence on renal dialysis: Secondary | ICD-10-CM | POA: Diagnosis not present

## 2020-01-11 DIAGNOSIS — N2581 Secondary hyperparathyroidism of renal origin: Secondary | ICD-10-CM | POA: Diagnosis not present

## 2020-01-11 DIAGNOSIS — D631 Anemia in chronic kidney disease: Secondary | ICD-10-CM | POA: Diagnosis not present

## 2020-01-11 DIAGNOSIS — D509 Iron deficiency anemia, unspecified: Secondary | ICD-10-CM | POA: Diagnosis not present

## 2020-01-13 DIAGNOSIS — N186 End stage renal disease: Secondary | ICD-10-CM | POA: Diagnosis not present

## 2020-01-13 DIAGNOSIS — D509 Iron deficiency anemia, unspecified: Secondary | ICD-10-CM | POA: Diagnosis not present

## 2020-01-13 DIAGNOSIS — N2581 Secondary hyperparathyroidism of renal origin: Secondary | ICD-10-CM | POA: Diagnosis not present

## 2020-01-13 DIAGNOSIS — Z992 Dependence on renal dialysis: Secondary | ICD-10-CM | POA: Diagnosis not present

## 2020-01-13 DIAGNOSIS — D631 Anemia in chronic kidney disease: Secondary | ICD-10-CM | POA: Diagnosis not present

## 2020-01-15 DIAGNOSIS — N2581 Secondary hyperparathyroidism of renal origin: Secondary | ICD-10-CM | POA: Diagnosis not present

## 2020-01-15 DIAGNOSIS — N186 End stage renal disease: Secondary | ICD-10-CM | POA: Diagnosis not present

## 2020-01-15 DIAGNOSIS — Z992 Dependence on renal dialysis: Secondary | ICD-10-CM | POA: Diagnosis not present

## 2020-01-15 DIAGNOSIS — D509 Iron deficiency anemia, unspecified: Secondary | ICD-10-CM | POA: Diagnosis not present

## 2020-01-15 DIAGNOSIS — D631 Anemia in chronic kidney disease: Secondary | ICD-10-CM | POA: Diagnosis not present

## 2020-01-17 DIAGNOSIS — N186 End stage renal disease: Secondary | ICD-10-CM | POA: Diagnosis not present

## 2020-01-17 DIAGNOSIS — D631 Anemia in chronic kidney disease: Secondary | ICD-10-CM | POA: Diagnosis not present

## 2020-01-17 DIAGNOSIS — N2581 Secondary hyperparathyroidism of renal origin: Secondary | ICD-10-CM | POA: Diagnosis not present

## 2020-01-17 DIAGNOSIS — Z992 Dependence on renal dialysis: Secondary | ICD-10-CM | POA: Diagnosis not present

## 2020-01-17 DIAGNOSIS — D509 Iron deficiency anemia, unspecified: Secondary | ICD-10-CM | POA: Diagnosis not present

## 2020-01-19 DIAGNOSIS — N2581 Secondary hyperparathyroidism of renal origin: Secondary | ICD-10-CM | POA: Diagnosis not present

## 2020-01-19 DIAGNOSIS — D509 Iron deficiency anemia, unspecified: Secondary | ICD-10-CM | POA: Diagnosis not present

## 2020-01-19 DIAGNOSIS — D631 Anemia in chronic kidney disease: Secondary | ICD-10-CM | POA: Diagnosis not present

## 2020-01-19 DIAGNOSIS — N186 End stage renal disease: Secondary | ICD-10-CM | POA: Diagnosis not present

## 2020-01-19 DIAGNOSIS — Z992 Dependence on renal dialysis: Secondary | ICD-10-CM | POA: Diagnosis not present

## 2020-01-22 DIAGNOSIS — D509 Iron deficiency anemia, unspecified: Secondary | ICD-10-CM | POA: Diagnosis not present

## 2020-01-22 DIAGNOSIS — D631 Anemia in chronic kidney disease: Secondary | ICD-10-CM | POA: Diagnosis not present

## 2020-01-22 DIAGNOSIS — N186 End stage renal disease: Secondary | ICD-10-CM | POA: Diagnosis not present

## 2020-01-22 DIAGNOSIS — Z992 Dependence on renal dialysis: Secondary | ICD-10-CM | POA: Diagnosis not present

## 2020-01-22 DIAGNOSIS — N2581 Secondary hyperparathyroidism of renal origin: Secondary | ICD-10-CM | POA: Diagnosis not present

## 2020-01-25 DIAGNOSIS — N186 End stage renal disease: Secondary | ICD-10-CM | POA: Diagnosis not present

## 2020-01-25 DIAGNOSIS — N2581 Secondary hyperparathyroidism of renal origin: Secondary | ICD-10-CM | POA: Diagnosis not present

## 2020-01-25 DIAGNOSIS — D509 Iron deficiency anemia, unspecified: Secondary | ICD-10-CM | POA: Diagnosis not present

## 2020-01-25 DIAGNOSIS — Z992 Dependence on renal dialysis: Secondary | ICD-10-CM | POA: Diagnosis not present

## 2020-01-25 DIAGNOSIS — D631 Anemia in chronic kidney disease: Secondary | ICD-10-CM | POA: Diagnosis not present

## 2020-01-27 ENCOUNTER — Telehealth: Payer: Self-pay | Admitting: Emergency Medicine

## 2020-01-27 DIAGNOSIS — N2581 Secondary hyperparathyroidism of renal origin: Secondary | ICD-10-CM | POA: Diagnosis not present

## 2020-01-27 DIAGNOSIS — D509 Iron deficiency anemia, unspecified: Secondary | ICD-10-CM | POA: Diagnosis not present

## 2020-01-27 DIAGNOSIS — N186 End stage renal disease: Secondary | ICD-10-CM | POA: Diagnosis not present

## 2020-01-27 DIAGNOSIS — Z23 Encounter for immunization: Secondary | ICD-10-CM | POA: Diagnosis not present

## 2020-01-27 DIAGNOSIS — D631 Anemia in chronic kidney disease: Secondary | ICD-10-CM | POA: Diagnosis not present

## 2020-01-27 DIAGNOSIS — Z992 Dependence on renal dialysis: Secondary | ICD-10-CM | POA: Diagnosis not present

## 2020-01-27 DIAGNOSIS — N039 Chronic nephritic syndrome with unspecified morphologic changes: Secondary | ICD-10-CM | POA: Diagnosis not present

## 2020-01-27 NOTE — Telephone Encounter (Signed)
Patient notified ILR at [facility]. Patient instructed to unplug monitor and return kit wll be sent. Patient will notify office if she wants to schedule ILR explant.

## 2020-01-29 DIAGNOSIS — D631 Anemia in chronic kidney disease: Secondary | ICD-10-CM | POA: Diagnosis not present

## 2020-01-29 DIAGNOSIS — D509 Iron deficiency anemia, unspecified: Secondary | ICD-10-CM | POA: Diagnosis not present

## 2020-01-29 DIAGNOSIS — N039 Chronic nephritic syndrome with unspecified morphologic changes: Secondary | ICD-10-CM | POA: Diagnosis not present

## 2020-01-29 DIAGNOSIS — N186 End stage renal disease: Secondary | ICD-10-CM | POA: Diagnosis not present

## 2020-01-29 DIAGNOSIS — N2581 Secondary hyperparathyroidism of renal origin: Secondary | ICD-10-CM | POA: Diagnosis not present

## 2020-01-29 DIAGNOSIS — Z992 Dependence on renal dialysis: Secondary | ICD-10-CM | POA: Diagnosis not present

## 2020-02-01 DIAGNOSIS — N186 End stage renal disease: Secondary | ICD-10-CM | POA: Diagnosis not present

## 2020-02-01 DIAGNOSIS — N2581 Secondary hyperparathyroidism of renal origin: Secondary | ICD-10-CM | POA: Diagnosis not present

## 2020-02-01 DIAGNOSIS — D509 Iron deficiency anemia, unspecified: Secondary | ICD-10-CM | POA: Diagnosis not present

## 2020-02-01 DIAGNOSIS — D631 Anemia in chronic kidney disease: Secondary | ICD-10-CM | POA: Diagnosis not present

## 2020-02-01 DIAGNOSIS — Z992 Dependence on renal dialysis: Secondary | ICD-10-CM | POA: Diagnosis not present

## 2020-02-01 DIAGNOSIS — N039 Chronic nephritic syndrome with unspecified morphologic changes: Secondary | ICD-10-CM | POA: Diagnosis not present

## 2020-02-03 DIAGNOSIS — Z992 Dependence on renal dialysis: Secondary | ICD-10-CM | POA: Diagnosis not present

## 2020-02-03 DIAGNOSIS — N186 End stage renal disease: Secondary | ICD-10-CM | POA: Diagnosis not present

## 2020-02-03 DIAGNOSIS — N2581 Secondary hyperparathyroidism of renal origin: Secondary | ICD-10-CM | POA: Diagnosis not present

## 2020-02-03 DIAGNOSIS — D509 Iron deficiency anemia, unspecified: Secondary | ICD-10-CM | POA: Diagnosis not present

## 2020-02-03 DIAGNOSIS — N039 Chronic nephritic syndrome with unspecified morphologic changes: Secondary | ICD-10-CM | POA: Diagnosis not present

## 2020-02-03 DIAGNOSIS — D631 Anemia in chronic kidney disease: Secondary | ICD-10-CM | POA: Diagnosis not present

## 2020-02-05 DIAGNOSIS — N2581 Secondary hyperparathyroidism of renal origin: Secondary | ICD-10-CM | POA: Diagnosis not present

## 2020-02-05 DIAGNOSIS — D631 Anemia in chronic kidney disease: Secondary | ICD-10-CM | POA: Diagnosis not present

## 2020-02-05 DIAGNOSIS — Z992 Dependence on renal dialysis: Secondary | ICD-10-CM | POA: Diagnosis not present

## 2020-02-05 DIAGNOSIS — N039 Chronic nephritic syndrome with unspecified morphologic changes: Secondary | ICD-10-CM | POA: Diagnosis not present

## 2020-02-05 DIAGNOSIS — D509 Iron deficiency anemia, unspecified: Secondary | ICD-10-CM | POA: Diagnosis not present

## 2020-02-05 DIAGNOSIS — N186 End stage renal disease: Secondary | ICD-10-CM | POA: Diagnosis not present

## 2020-02-08 DIAGNOSIS — N039 Chronic nephritic syndrome with unspecified morphologic changes: Secondary | ICD-10-CM | POA: Diagnosis not present

## 2020-02-08 DIAGNOSIS — D509 Iron deficiency anemia, unspecified: Secondary | ICD-10-CM | POA: Diagnosis not present

## 2020-02-08 DIAGNOSIS — N186 End stage renal disease: Secondary | ICD-10-CM | POA: Diagnosis not present

## 2020-02-08 DIAGNOSIS — Z992 Dependence on renal dialysis: Secondary | ICD-10-CM | POA: Diagnosis not present

## 2020-02-08 DIAGNOSIS — N2581 Secondary hyperparathyroidism of renal origin: Secondary | ICD-10-CM | POA: Diagnosis not present

## 2020-02-08 DIAGNOSIS — D631 Anemia in chronic kidney disease: Secondary | ICD-10-CM | POA: Diagnosis not present

## 2020-02-09 DIAGNOSIS — T82858A Stenosis of vascular prosthetic devices, implants and grafts, initial encounter: Secondary | ICD-10-CM | POA: Diagnosis not present

## 2020-02-09 DIAGNOSIS — Z992 Dependence on renal dialysis: Secondary | ICD-10-CM | POA: Diagnosis not present

## 2020-02-09 DIAGNOSIS — I871 Compression of vein: Secondary | ICD-10-CM | POA: Diagnosis not present

## 2020-02-09 DIAGNOSIS — N186 End stage renal disease: Secondary | ICD-10-CM | POA: Diagnosis not present

## 2020-02-10 DIAGNOSIS — N2581 Secondary hyperparathyroidism of renal origin: Secondary | ICD-10-CM | POA: Diagnosis not present

## 2020-02-10 DIAGNOSIS — N039 Chronic nephritic syndrome with unspecified morphologic changes: Secondary | ICD-10-CM | POA: Diagnosis not present

## 2020-02-10 DIAGNOSIS — N186 End stage renal disease: Secondary | ICD-10-CM | POA: Diagnosis not present

## 2020-02-10 DIAGNOSIS — D509 Iron deficiency anemia, unspecified: Secondary | ICD-10-CM | POA: Diagnosis not present

## 2020-02-10 DIAGNOSIS — Z992 Dependence on renal dialysis: Secondary | ICD-10-CM | POA: Diagnosis not present

## 2020-02-10 DIAGNOSIS — D631 Anemia in chronic kidney disease: Secondary | ICD-10-CM | POA: Diagnosis not present

## 2020-02-12 DIAGNOSIS — D509 Iron deficiency anemia, unspecified: Secondary | ICD-10-CM | POA: Diagnosis not present

## 2020-02-12 DIAGNOSIS — N039 Chronic nephritic syndrome with unspecified morphologic changes: Secondary | ICD-10-CM | POA: Diagnosis not present

## 2020-02-12 DIAGNOSIS — N186 End stage renal disease: Secondary | ICD-10-CM | POA: Diagnosis not present

## 2020-02-12 DIAGNOSIS — N2581 Secondary hyperparathyroidism of renal origin: Secondary | ICD-10-CM | POA: Diagnosis not present

## 2020-02-12 DIAGNOSIS — Z992 Dependence on renal dialysis: Secondary | ICD-10-CM | POA: Diagnosis not present

## 2020-02-12 DIAGNOSIS — D631 Anemia in chronic kidney disease: Secondary | ICD-10-CM | POA: Diagnosis not present

## 2020-02-15 DIAGNOSIS — D631 Anemia in chronic kidney disease: Secondary | ICD-10-CM | POA: Diagnosis not present

## 2020-02-15 DIAGNOSIS — N039 Chronic nephritic syndrome with unspecified morphologic changes: Secondary | ICD-10-CM | POA: Diagnosis not present

## 2020-02-15 DIAGNOSIS — N186 End stage renal disease: Secondary | ICD-10-CM | POA: Diagnosis not present

## 2020-02-15 DIAGNOSIS — N2581 Secondary hyperparathyroidism of renal origin: Secondary | ICD-10-CM | POA: Diagnosis not present

## 2020-02-15 DIAGNOSIS — D509 Iron deficiency anemia, unspecified: Secondary | ICD-10-CM | POA: Diagnosis not present

## 2020-02-15 DIAGNOSIS — Z992 Dependence on renal dialysis: Secondary | ICD-10-CM | POA: Diagnosis not present

## 2020-02-17 DIAGNOSIS — D509 Iron deficiency anemia, unspecified: Secondary | ICD-10-CM | POA: Diagnosis not present

## 2020-02-17 DIAGNOSIS — N186 End stage renal disease: Secondary | ICD-10-CM | POA: Diagnosis not present

## 2020-02-17 DIAGNOSIS — N039 Chronic nephritic syndrome with unspecified morphologic changes: Secondary | ICD-10-CM | POA: Diagnosis not present

## 2020-02-17 DIAGNOSIS — Z992 Dependence on renal dialysis: Secondary | ICD-10-CM | POA: Diagnosis not present

## 2020-02-17 DIAGNOSIS — D631 Anemia in chronic kidney disease: Secondary | ICD-10-CM | POA: Diagnosis not present

## 2020-02-17 DIAGNOSIS — N2581 Secondary hyperparathyroidism of renal origin: Secondary | ICD-10-CM | POA: Diagnosis not present

## 2020-02-19 DIAGNOSIS — D509 Iron deficiency anemia, unspecified: Secondary | ICD-10-CM | POA: Diagnosis not present

## 2020-02-19 DIAGNOSIS — N2581 Secondary hyperparathyroidism of renal origin: Secondary | ICD-10-CM | POA: Diagnosis not present

## 2020-02-19 DIAGNOSIS — N039 Chronic nephritic syndrome with unspecified morphologic changes: Secondary | ICD-10-CM | POA: Diagnosis not present

## 2020-02-19 DIAGNOSIS — N186 End stage renal disease: Secondary | ICD-10-CM | POA: Diagnosis not present

## 2020-02-19 DIAGNOSIS — Z992 Dependence on renal dialysis: Secondary | ICD-10-CM | POA: Diagnosis not present

## 2020-02-19 DIAGNOSIS — D631 Anemia in chronic kidney disease: Secondary | ICD-10-CM | POA: Diagnosis not present

## 2020-02-22 DIAGNOSIS — D509 Iron deficiency anemia, unspecified: Secondary | ICD-10-CM | POA: Diagnosis not present

## 2020-02-22 DIAGNOSIS — Z992 Dependence on renal dialysis: Secondary | ICD-10-CM | POA: Diagnosis not present

## 2020-02-22 DIAGNOSIS — N2581 Secondary hyperparathyroidism of renal origin: Secondary | ICD-10-CM | POA: Diagnosis not present

## 2020-02-22 DIAGNOSIS — D631 Anemia in chronic kidney disease: Secondary | ICD-10-CM | POA: Diagnosis not present

## 2020-02-22 DIAGNOSIS — N186 End stage renal disease: Secondary | ICD-10-CM | POA: Diagnosis not present

## 2020-02-22 DIAGNOSIS — N039 Chronic nephritic syndrome with unspecified morphologic changes: Secondary | ICD-10-CM | POA: Diagnosis not present

## 2020-02-24 DIAGNOSIS — Z992 Dependence on renal dialysis: Secondary | ICD-10-CM | POA: Diagnosis not present

## 2020-02-24 DIAGNOSIS — D631 Anemia in chronic kidney disease: Secondary | ICD-10-CM | POA: Diagnosis not present

## 2020-02-24 DIAGNOSIS — D509 Iron deficiency anemia, unspecified: Secondary | ICD-10-CM | POA: Diagnosis not present

## 2020-02-24 DIAGNOSIS — N039 Chronic nephritic syndrome with unspecified morphologic changes: Secondary | ICD-10-CM | POA: Diagnosis not present

## 2020-02-24 DIAGNOSIS — N186 End stage renal disease: Secondary | ICD-10-CM | POA: Diagnosis not present

## 2020-02-24 DIAGNOSIS — N2581 Secondary hyperparathyroidism of renal origin: Secondary | ICD-10-CM | POA: Diagnosis not present

## 2020-02-26 DIAGNOSIS — N186 End stage renal disease: Secondary | ICD-10-CM | POA: Diagnosis not present

## 2020-02-26 DIAGNOSIS — Z992 Dependence on renal dialysis: Secondary | ICD-10-CM | POA: Diagnosis not present

## 2020-02-26 DIAGNOSIS — D631 Anemia in chronic kidney disease: Secondary | ICD-10-CM | POA: Diagnosis not present

## 2020-02-26 DIAGNOSIS — N039 Chronic nephritic syndrome with unspecified morphologic changes: Secondary | ICD-10-CM | POA: Diagnosis not present

## 2020-02-26 DIAGNOSIS — D509 Iron deficiency anemia, unspecified: Secondary | ICD-10-CM | POA: Diagnosis not present

## 2020-02-26 DIAGNOSIS — N2581 Secondary hyperparathyroidism of renal origin: Secondary | ICD-10-CM | POA: Diagnosis not present

## 2020-02-27 DIAGNOSIS — N039 Chronic nephritic syndrome with unspecified morphologic changes: Secondary | ICD-10-CM | POA: Diagnosis not present

## 2020-02-27 DIAGNOSIS — N186 End stage renal disease: Secondary | ICD-10-CM | POA: Diagnosis not present

## 2020-02-27 DIAGNOSIS — Z992 Dependence on renal dialysis: Secondary | ICD-10-CM | POA: Diagnosis not present

## 2020-02-29 DIAGNOSIS — D631 Anemia in chronic kidney disease: Secondary | ICD-10-CM | POA: Diagnosis not present

## 2020-02-29 DIAGNOSIS — D509 Iron deficiency anemia, unspecified: Secondary | ICD-10-CM | POA: Diagnosis not present

## 2020-02-29 DIAGNOSIS — Z992 Dependence on renal dialysis: Secondary | ICD-10-CM | POA: Diagnosis not present

## 2020-02-29 DIAGNOSIS — N2581 Secondary hyperparathyroidism of renal origin: Secondary | ICD-10-CM | POA: Diagnosis not present

## 2020-02-29 DIAGNOSIS — N186 End stage renal disease: Secondary | ICD-10-CM | POA: Diagnosis not present

## 2020-03-02 DIAGNOSIS — D509 Iron deficiency anemia, unspecified: Secondary | ICD-10-CM | POA: Diagnosis not present

## 2020-03-02 DIAGNOSIS — N2581 Secondary hyperparathyroidism of renal origin: Secondary | ICD-10-CM | POA: Diagnosis not present

## 2020-03-02 DIAGNOSIS — N186 End stage renal disease: Secondary | ICD-10-CM | POA: Diagnosis not present

## 2020-03-02 DIAGNOSIS — D631 Anemia in chronic kidney disease: Secondary | ICD-10-CM | POA: Diagnosis not present

## 2020-03-02 DIAGNOSIS — Z992 Dependence on renal dialysis: Secondary | ICD-10-CM | POA: Diagnosis not present

## 2020-03-04 DIAGNOSIS — N2581 Secondary hyperparathyroidism of renal origin: Secondary | ICD-10-CM | POA: Diagnosis not present

## 2020-03-04 DIAGNOSIS — Z992 Dependence on renal dialysis: Secondary | ICD-10-CM | POA: Diagnosis not present

## 2020-03-04 DIAGNOSIS — D509 Iron deficiency anemia, unspecified: Secondary | ICD-10-CM | POA: Diagnosis not present

## 2020-03-04 DIAGNOSIS — N186 End stage renal disease: Secondary | ICD-10-CM | POA: Diagnosis not present

## 2020-03-04 DIAGNOSIS — D631 Anemia in chronic kidney disease: Secondary | ICD-10-CM | POA: Diagnosis not present

## 2020-03-07 DIAGNOSIS — N186 End stage renal disease: Secondary | ICD-10-CM | POA: Diagnosis not present

## 2020-03-07 DIAGNOSIS — Z992 Dependence on renal dialysis: Secondary | ICD-10-CM | POA: Diagnosis not present

## 2020-03-07 DIAGNOSIS — N2581 Secondary hyperparathyroidism of renal origin: Secondary | ICD-10-CM | POA: Diagnosis not present

## 2020-03-07 DIAGNOSIS — D631 Anemia in chronic kidney disease: Secondary | ICD-10-CM | POA: Diagnosis not present

## 2020-03-07 DIAGNOSIS — D509 Iron deficiency anemia, unspecified: Secondary | ICD-10-CM | POA: Diagnosis not present

## 2020-03-09 DIAGNOSIS — D631 Anemia in chronic kidney disease: Secondary | ICD-10-CM | POA: Diagnosis not present

## 2020-03-09 DIAGNOSIS — N2581 Secondary hyperparathyroidism of renal origin: Secondary | ICD-10-CM | POA: Diagnosis not present

## 2020-03-09 DIAGNOSIS — D509 Iron deficiency anemia, unspecified: Secondary | ICD-10-CM | POA: Diagnosis not present

## 2020-03-09 DIAGNOSIS — Z992 Dependence on renal dialysis: Secondary | ICD-10-CM | POA: Diagnosis not present

## 2020-03-09 DIAGNOSIS — N186 End stage renal disease: Secondary | ICD-10-CM | POA: Diagnosis not present

## 2020-03-11 DIAGNOSIS — D631 Anemia in chronic kidney disease: Secondary | ICD-10-CM | POA: Diagnosis not present

## 2020-03-11 DIAGNOSIS — N186 End stage renal disease: Secondary | ICD-10-CM | POA: Diagnosis not present

## 2020-03-11 DIAGNOSIS — D509 Iron deficiency anemia, unspecified: Secondary | ICD-10-CM | POA: Diagnosis not present

## 2020-03-11 DIAGNOSIS — Z992 Dependence on renal dialysis: Secondary | ICD-10-CM | POA: Diagnosis not present

## 2020-03-11 DIAGNOSIS — N2581 Secondary hyperparathyroidism of renal origin: Secondary | ICD-10-CM | POA: Diagnosis not present

## 2020-03-14 DIAGNOSIS — Z992 Dependence on renal dialysis: Secondary | ICD-10-CM | POA: Diagnosis not present

## 2020-03-14 DIAGNOSIS — D509 Iron deficiency anemia, unspecified: Secondary | ICD-10-CM | POA: Diagnosis not present

## 2020-03-14 DIAGNOSIS — N2581 Secondary hyperparathyroidism of renal origin: Secondary | ICD-10-CM | POA: Diagnosis not present

## 2020-03-14 DIAGNOSIS — N186 End stage renal disease: Secondary | ICD-10-CM | POA: Diagnosis not present

## 2020-03-14 DIAGNOSIS — D631 Anemia in chronic kidney disease: Secondary | ICD-10-CM | POA: Diagnosis not present

## 2020-03-16 DIAGNOSIS — D631 Anemia in chronic kidney disease: Secondary | ICD-10-CM | POA: Diagnosis not present

## 2020-03-16 DIAGNOSIS — N2581 Secondary hyperparathyroidism of renal origin: Secondary | ICD-10-CM | POA: Diagnosis not present

## 2020-03-16 DIAGNOSIS — D509 Iron deficiency anemia, unspecified: Secondary | ICD-10-CM | POA: Diagnosis not present

## 2020-03-16 DIAGNOSIS — Z992 Dependence on renal dialysis: Secondary | ICD-10-CM | POA: Diagnosis not present

## 2020-03-16 DIAGNOSIS — N186 End stage renal disease: Secondary | ICD-10-CM | POA: Diagnosis not present

## 2020-03-18 DIAGNOSIS — N186 End stage renal disease: Secondary | ICD-10-CM | POA: Diagnosis not present

## 2020-03-18 DIAGNOSIS — N2581 Secondary hyperparathyroidism of renal origin: Secondary | ICD-10-CM | POA: Diagnosis not present

## 2020-03-18 DIAGNOSIS — Z992 Dependence on renal dialysis: Secondary | ICD-10-CM | POA: Diagnosis not present

## 2020-03-18 DIAGNOSIS — D509 Iron deficiency anemia, unspecified: Secondary | ICD-10-CM | POA: Diagnosis not present

## 2020-03-18 DIAGNOSIS — D631 Anemia in chronic kidney disease: Secondary | ICD-10-CM | POA: Diagnosis not present

## 2020-03-21 DIAGNOSIS — N186 End stage renal disease: Secondary | ICD-10-CM | POA: Diagnosis not present

## 2020-03-21 DIAGNOSIS — Z992 Dependence on renal dialysis: Secondary | ICD-10-CM | POA: Diagnosis not present

## 2020-03-21 DIAGNOSIS — N2581 Secondary hyperparathyroidism of renal origin: Secondary | ICD-10-CM | POA: Diagnosis not present

## 2020-03-21 DIAGNOSIS — D509 Iron deficiency anemia, unspecified: Secondary | ICD-10-CM | POA: Diagnosis not present

## 2020-03-21 DIAGNOSIS — D631 Anemia in chronic kidney disease: Secondary | ICD-10-CM | POA: Diagnosis not present

## 2020-03-23 DIAGNOSIS — D631 Anemia in chronic kidney disease: Secondary | ICD-10-CM | POA: Diagnosis not present

## 2020-03-23 DIAGNOSIS — D509 Iron deficiency anemia, unspecified: Secondary | ICD-10-CM | POA: Diagnosis not present

## 2020-03-23 DIAGNOSIS — Z992 Dependence on renal dialysis: Secondary | ICD-10-CM | POA: Diagnosis not present

## 2020-03-23 DIAGNOSIS — N2581 Secondary hyperparathyroidism of renal origin: Secondary | ICD-10-CM | POA: Diagnosis not present

## 2020-03-23 DIAGNOSIS — N186 End stage renal disease: Secondary | ICD-10-CM | POA: Diagnosis not present

## 2020-03-25 DIAGNOSIS — D631 Anemia in chronic kidney disease: Secondary | ICD-10-CM | POA: Diagnosis not present

## 2020-03-25 DIAGNOSIS — D509 Iron deficiency anemia, unspecified: Secondary | ICD-10-CM | POA: Diagnosis not present

## 2020-03-25 DIAGNOSIS — Z992 Dependence on renal dialysis: Secondary | ICD-10-CM | POA: Diagnosis not present

## 2020-03-25 DIAGNOSIS — N186 End stage renal disease: Secondary | ICD-10-CM | POA: Diagnosis not present

## 2020-03-25 DIAGNOSIS — N2581 Secondary hyperparathyroidism of renal origin: Secondary | ICD-10-CM | POA: Diagnosis not present

## 2020-03-28 DIAGNOSIS — D631 Anemia in chronic kidney disease: Secondary | ICD-10-CM | POA: Diagnosis not present

## 2020-03-28 DIAGNOSIS — D509 Iron deficiency anemia, unspecified: Secondary | ICD-10-CM | POA: Diagnosis not present

## 2020-03-28 DIAGNOSIS — N2581 Secondary hyperparathyroidism of renal origin: Secondary | ICD-10-CM | POA: Diagnosis not present

## 2020-03-28 DIAGNOSIS — N186 End stage renal disease: Secondary | ICD-10-CM | POA: Diagnosis not present

## 2020-03-28 DIAGNOSIS — Z992 Dependence on renal dialysis: Secondary | ICD-10-CM | POA: Diagnosis not present

## 2020-03-29 DIAGNOSIS — N186 End stage renal disease: Secondary | ICD-10-CM | POA: Diagnosis not present

## 2020-03-29 DIAGNOSIS — N039 Chronic nephritic syndrome with unspecified morphologic changes: Secondary | ICD-10-CM | POA: Diagnosis not present

## 2020-03-29 DIAGNOSIS — Z992 Dependence on renal dialysis: Secondary | ICD-10-CM | POA: Diagnosis not present

## 2020-03-30 DIAGNOSIS — N2581 Secondary hyperparathyroidism of renal origin: Secondary | ICD-10-CM | POA: Diagnosis not present

## 2020-03-30 DIAGNOSIS — N186 End stage renal disease: Secondary | ICD-10-CM | POA: Diagnosis not present

## 2020-03-30 DIAGNOSIS — D631 Anemia in chronic kidney disease: Secondary | ICD-10-CM | POA: Diagnosis not present

## 2020-03-30 DIAGNOSIS — Z992 Dependence on renal dialysis: Secondary | ICD-10-CM | POA: Diagnosis not present

## 2020-03-30 DIAGNOSIS — D509 Iron deficiency anemia, unspecified: Secondary | ICD-10-CM | POA: Diagnosis not present

## 2020-04-01 DIAGNOSIS — N2581 Secondary hyperparathyroidism of renal origin: Secondary | ICD-10-CM | POA: Diagnosis not present

## 2020-04-01 DIAGNOSIS — D631 Anemia in chronic kidney disease: Secondary | ICD-10-CM | POA: Diagnosis not present

## 2020-04-01 DIAGNOSIS — Z992 Dependence on renal dialysis: Secondary | ICD-10-CM | POA: Diagnosis not present

## 2020-04-01 DIAGNOSIS — D509 Iron deficiency anemia, unspecified: Secondary | ICD-10-CM | POA: Diagnosis not present

## 2020-04-01 DIAGNOSIS — N186 End stage renal disease: Secondary | ICD-10-CM | POA: Diagnosis not present

## 2020-04-04 DIAGNOSIS — D631 Anemia in chronic kidney disease: Secondary | ICD-10-CM | POA: Diagnosis not present

## 2020-04-04 DIAGNOSIS — N186 End stage renal disease: Secondary | ICD-10-CM | POA: Diagnosis not present

## 2020-04-04 DIAGNOSIS — Z992 Dependence on renal dialysis: Secondary | ICD-10-CM | POA: Diagnosis not present

## 2020-04-04 DIAGNOSIS — D509 Iron deficiency anemia, unspecified: Secondary | ICD-10-CM | POA: Diagnosis not present

## 2020-04-04 DIAGNOSIS — N2581 Secondary hyperparathyroidism of renal origin: Secondary | ICD-10-CM | POA: Diagnosis not present

## 2020-04-06 DIAGNOSIS — N2581 Secondary hyperparathyroidism of renal origin: Secondary | ICD-10-CM | POA: Diagnosis not present

## 2020-04-06 DIAGNOSIS — D631 Anemia in chronic kidney disease: Secondary | ICD-10-CM | POA: Diagnosis not present

## 2020-04-06 DIAGNOSIS — Z992 Dependence on renal dialysis: Secondary | ICD-10-CM | POA: Diagnosis not present

## 2020-04-06 DIAGNOSIS — D509 Iron deficiency anemia, unspecified: Secondary | ICD-10-CM | POA: Diagnosis not present

## 2020-04-06 DIAGNOSIS — N186 End stage renal disease: Secondary | ICD-10-CM | POA: Diagnosis not present

## 2020-04-08 DIAGNOSIS — D509 Iron deficiency anemia, unspecified: Secondary | ICD-10-CM | POA: Diagnosis not present

## 2020-04-08 DIAGNOSIS — N2581 Secondary hyperparathyroidism of renal origin: Secondary | ICD-10-CM | POA: Diagnosis not present

## 2020-04-08 DIAGNOSIS — Z992 Dependence on renal dialysis: Secondary | ICD-10-CM | POA: Diagnosis not present

## 2020-04-08 DIAGNOSIS — D631 Anemia in chronic kidney disease: Secondary | ICD-10-CM | POA: Diagnosis not present

## 2020-04-08 DIAGNOSIS — N186 End stage renal disease: Secondary | ICD-10-CM | POA: Diagnosis not present

## 2020-04-11 DIAGNOSIS — D631 Anemia in chronic kidney disease: Secondary | ICD-10-CM | POA: Diagnosis not present

## 2020-04-11 DIAGNOSIS — Z992 Dependence on renal dialysis: Secondary | ICD-10-CM | POA: Diagnosis not present

## 2020-04-11 DIAGNOSIS — N186 End stage renal disease: Secondary | ICD-10-CM | POA: Diagnosis not present

## 2020-04-11 DIAGNOSIS — N2581 Secondary hyperparathyroidism of renal origin: Secondary | ICD-10-CM | POA: Diagnosis not present

## 2020-04-11 DIAGNOSIS — D509 Iron deficiency anemia, unspecified: Secondary | ICD-10-CM | POA: Diagnosis not present

## 2020-04-13 DIAGNOSIS — Z992 Dependence on renal dialysis: Secondary | ICD-10-CM | POA: Diagnosis not present

## 2020-04-13 DIAGNOSIS — N2581 Secondary hyperparathyroidism of renal origin: Secondary | ICD-10-CM | POA: Diagnosis not present

## 2020-04-13 DIAGNOSIS — N186 End stage renal disease: Secondary | ICD-10-CM | POA: Diagnosis not present

## 2020-04-13 DIAGNOSIS — D631 Anemia in chronic kidney disease: Secondary | ICD-10-CM | POA: Diagnosis not present

## 2020-04-13 DIAGNOSIS — D509 Iron deficiency anemia, unspecified: Secondary | ICD-10-CM | POA: Diagnosis not present

## 2020-04-15 DIAGNOSIS — N2581 Secondary hyperparathyroidism of renal origin: Secondary | ICD-10-CM | POA: Diagnosis not present

## 2020-04-15 DIAGNOSIS — N186 End stage renal disease: Secondary | ICD-10-CM | POA: Diagnosis not present

## 2020-04-15 DIAGNOSIS — D509 Iron deficiency anemia, unspecified: Secondary | ICD-10-CM | POA: Diagnosis not present

## 2020-04-15 DIAGNOSIS — D631 Anemia in chronic kidney disease: Secondary | ICD-10-CM | POA: Diagnosis not present

## 2020-04-15 DIAGNOSIS — Z992 Dependence on renal dialysis: Secondary | ICD-10-CM | POA: Diagnosis not present

## 2020-04-18 DIAGNOSIS — D509 Iron deficiency anemia, unspecified: Secondary | ICD-10-CM | POA: Diagnosis not present

## 2020-04-18 DIAGNOSIS — Z992 Dependence on renal dialysis: Secondary | ICD-10-CM | POA: Diagnosis not present

## 2020-04-18 DIAGNOSIS — D631 Anemia in chronic kidney disease: Secondary | ICD-10-CM | POA: Diagnosis not present

## 2020-04-18 DIAGNOSIS — N2581 Secondary hyperparathyroidism of renal origin: Secondary | ICD-10-CM | POA: Diagnosis not present

## 2020-04-18 DIAGNOSIS — N186 End stage renal disease: Secondary | ICD-10-CM | POA: Diagnosis not present

## 2020-04-20 DIAGNOSIS — D509 Iron deficiency anemia, unspecified: Secondary | ICD-10-CM | POA: Diagnosis not present

## 2020-04-20 DIAGNOSIS — Z992 Dependence on renal dialysis: Secondary | ICD-10-CM | POA: Diagnosis not present

## 2020-04-20 DIAGNOSIS — D631 Anemia in chronic kidney disease: Secondary | ICD-10-CM | POA: Diagnosis not present

## 2020-04-20 DIAGNOSIS — N186 End stage renal disease: Secondary | ICD-10-CM | POA: Diagnosis not present

## 2020-04-20 DIAGNOSIS — N2581 Secondary hyperparathyroidism of renal origin: Secondary | ICD-10-CM | POA: Diagnosis not present

## 2020-04-22 DIAGNOSIS — Z992 Dependence on renal dialysis: Secondary | ICD-10-CM | POA: Diagnosis not present

## 2020-04-22 DIAGNOSIS — D631 Anemia in chronic kidney disease: Secondary | ICD-10-CM | POA: Diagnosis not present

## 2020-04-22 DIAGNOSIS — D509 Iron deficiency anemia, unspecified: Secondary | ICD-10-CM | POA: Diagnosis not present

## 2020-04-22 DIAGNOSIS — N2581 Secondary hyperparathyroidism of renal origin: Secondary | ICD-10-CM | POA: Diagnosis not present

## 2020-04-22 DIAGNOSIS — N186 End stage renal disease: Secondary | ICD-10-CM | POA: Diagnosis not present

## 2020-04-25 DIAGNOSIS — D509 Iron deficiency anemia, unspecified: Secondary | ICD-10-CM | POA: Diagnosis not present

## 2020-04-25 DIAGNOSIS — N186 End stage renal disease: Secondary | ICD-10-CM | POA: Diagnosis not present

## 2020-04-25 DIAGNOSIS — N2581 Secondary hyperparathyroidism of renal origin: Secondary | ICD-10-CM | POA: Diagnosis not present

## 2020-04-25 DIAGNOSIS — D631 Anemia in chronic kidney disease: Secondary | ICD-10-CM | POA: Diagnosis not present

## 2020-04-25 DIAGNOSIS — Z992 Dependence on renal dialysis: Secondary | ICD-10-CM | POA: Diagnosis not present

## 2020-04-26 DIAGNOSIS — N186 End stage renal disease: Secondary | ICD-10-CM | POA: Diagnosis not present

## 2020-04-26 DIAGNOSIS — Z992 Dependence on renal dialysis: Secondary | ICD-10-CM | POA: Diagnosis not present

## 2020-04-26 DIAGNOSIS — N039 Chronic nephritic syndrome with unspecified morphologic changes: Secondary | ICD-10-CM | POA: Diagnosis not present

## 2020-04-27 DIAGNOSIS — D631 Anemia in chronic kidney disease: Secondary | ICD-10-CM | POA: Diagnosis not present

## 2020-04-27 DIAGNOSIS — N186 End stage renal disease: Secondary | ICD-10-CM | POA: Diagnosis not present

## 2020-04-27 DIAGNOSIS — Z992 Dependence on renal dialysis: Secondary | ICD-10-CM | POA: Diagnosis not present

## 2020-04-27 DIAGNOSIS — D509 Iron deficiency anemia, unspecified: Secondary | ICD-10-CM | POA: Diagnosis not present

## 2020-04-27 DIAGNOSIS — N2581 Secondary hyperparathyroidism of renal origin: Secondary | ICD-10-CM | POA: Diagnosis not present

## 2020-04-29 DIAGNOSIS — N186 End stage renal disease: Secondary | ICD-10-CM | POA: Diagnosis not present

## 2020-04-29 DIAGNOSIS — D509 Iron deficiency anemia, unspecified: Secondary | ICD-10-CM | POA: Diagnosis not present

## 2020-04-29 DIAGNOSIS — N2581 Secondary hyperparathyroidism of renal origin: Secondary | ICD-10-CM | POA: Diagnosis not present

## 2020-04-29 DIAGNOSIS — D631 Anemia in chronic kidney disease: Secondary | ICD-10-CM | POA: Diagnosis not present

## 2020-04-29 DIAGNOSIS — Z992 Dependence on renal dialysis: Secondary | ICD-10-CM | POA: Diagnosis not present

## 2020-05-02 DIAGNOSIS — D631 Anemia in chronic kidney disease: Secondary | ICD-10-CM | POA: Diagnosis not present

## 2020-05-02 DIAGNOSIS — D509 Iron deficiency anemia, unspecified: Secondary | ICD-10-CM | POA: Diagnosis not present

## 2020-05-02 DIAGNOSIS — N2581 Secondary hyperparathyroidism of renal origin: Secondary | ICD-10-CM | POA: Diagnosis not present

## 2020-05-02 DIAGNOSIS — Z992 Dependence on renal dialysis: Secondary | ICD-10-CM | POA: Diagnosis not present

## 2020-05-02 DIAGNOSIS — N186 End stage renal disease: Secondary | ICD-10-CM | POA: Diagnosis not present

## 2020-05-04 DIAGNOSIS — D509 Iron deficiency anemia, unspecified: Secondary | ICD-10-CM | POA: Diagnosis not present

## 2020-05-04 DIAGNOSIS — Z992 Dependence on renal dialysis: Secondary | ICD-10-CM | POA: Diagnosis not present

## 2020-05-04 DIAGNOSIS — D631 Anemia in chronic kidney disease: Secondary | ICD-10-CM | POA: Diagnosis not present

## 2020-05-04 DIAGNOSIS — N186 End stage renal disease: Secondary | ICD-10-CM | POA: Diagnosis not present

## 2020-05-04 DIAGNOSIS — N2581 Secondary hyperparathyroidism of renal origin: Secondary | ICD-10-CM | POA: Diagnosis not present

## 2020-05-06 DIAGNOSIS — N2581 Secondary hyperparathyroidism of renal origin: Secondary | ICD-10-CM | POA: Diagnosis not present

## 2020-05-06 DIAGNOSIS — D509 Iron deficiency anemia, unspecified: Secondary | ICD-10-CM | POA: Diagnosis not present

## 2020-05-06 DIAGNOSIS — Z992 Dependence on renal dialysis: Secondary | ICD-10-CM | POA: Diagnosis not present

## 2020-05-06 DIAGNOSIS — N186 End stage renal disease: Secondary | ICD-10-CM | POA: Diagnosis not present

## 2020-05-06 DIAGNOSIS — D631 Anemia in chronic kidney disease: Secondary | ICD-10-CM | POA: Diagnosis not present

## 2020-05-09 DIAGNOSIS — D631 Anemia in chronic kidney disease: Secondary | ICD-10-CM | POA: Diagnosis not present

## 2020-05-09 DIAGNOSIS — D509 Iron deficiency anemia, unspecified: Secondary | ICD-10-CM | POA: Diagnosis not present

## 2020-05-09 DIAGNOSIS — Z992 Dependence on renal dialysis: Secondary | ICD-10-CM | POA: Diagnosis not present

## 2020-05-09 DIAGNOSIS — N2581 Secondary hyperparathyroidism of renal origin: Secondary | ICD-10-CM | POA: Diagnosis not present

## 2020-05-09 DIAGNOSIS — N186 End stage renal disease: Secondary | ICD-10-CM | POA: Diagnosis not present

## 2020-05-11 DIAGNOSIS — N186 End stage renal disease: Secondary | ICD-10-CM | POA: Diagnosis not present

## 2020-05-11 DIAGNOSIS — D509 Iron deficiency anemia, unspecified: Secondary | ICD-10-CM | POA: Diagnosis not present

## 2020-05-11 DIAGNOSIS — Z992 Dependence on renal dialysis: Secondary | ICD-10-CM | POA: Diagnosis not present

## 2020-05-11 DIAGNOSIS — D631 Anemia in chronic kidney disease: Secondary | ICD-10-CM | POA: Diagnosis not present

## 2020-05-11 DIAGNOSIS — N2581 Secondary hyperparathyroidism of renal origin: Secondary | ICD-10-CM | POA: Diagnosis not present

## 2020-05-13 DIAGNOSIS — D509 Iron deficiency anemia, unspecified: Secondary | ICD-10-CM | POA: Diagnosis not present

## 2020-05-13 DIAGNOSIS — N186 End stage renal disease: Secondary | ICD-10-CM | POA: Diagnosis not present

## 2020-05-13 DIAGNOSIS — Z992 Dependence on renal dialysis: Secondary | ICD-10-CM | POA: Diagnosis not present

## 2020-05-13 DIAGNOSIS — D631 Anemia in chronic kidney disease: Secondary | ICD-10-CM | POA: Diagnosis not present

## 2020-05-13 DIAGNOSIS — N2581 Secondary hyperparathyroidism of renal origin: Secondary | ICD-10-CM | POA: Diagnosis not present

## 2020-05-16 DIAGNOSIS — N186 End stage renal disease: Secondary | ICD-10-CM | POA: Diagnosis not present

## 2020-05-16 DIAGNOSIS — D631 Anemia in chronic kidney disease: Secondary | ICD-10-CM | POA: Diagnosis not present

## 2020-05-16 DIAGNOSIS — D509 Iron deficiency anemia, unspecified: Secondary | ICD-10-CM | POA: Diagnosis not present

## 2020-05-16 DIAGNOSIS — N2581 Secondary hyperparathyroidism of renal origin: Secondary | ICD-10-CM | POA: Diagnosis not present

## 2020-05-16 DIAGNOSIS — Z992 Dependence on renal dialysis: Secondary | ICD-10-CM | POA: Diagnosis not present

## 2020-05-18 DIAGNOSIS — D509 Iron deficiency anemia, unspecified: Secondary | ICD-10-CM | POA: Diagnosis not present

## 2020-05-18 DIAGNOSIS — D631 Anemia in chronic kidney disease: Secondary | ICD-10-CM | POA: Diagnosis not present

## 2020-05-18 DIAGNOSIS — Z992 Dependence on renal dialysis: Secondary | ICD-10-CM | POA: Diagnosis not present

## 2020-05-18 DIAGNOSIS — N186 End stage renal disease: Secondary | ICD-10-CM | POA: Diagnosis not present

## 2020-05-18 DIAGNOSIS — N2581 Secondary hyperparathyroidism of renal origin: Secondary | ICD-10-CM | POA: Diagnosis not present

## 2020-05-20 DIAGNOSIS — D509 Iron deficiency anemia, unspecified: Secondary | ICD-10-CM | POA: Diagnosis not present

## 2020-05-20 DIAGNOSIS — N186 End stage renal disease: Secondary | ICD-10-CM | POA: Diagnosis not present

## 2020-05-20 DIAGNOSIS — D631 Anemia in chronic kidney disease: Secondary | ICD-10-CM | POA: Diagnosis not present

## 2020-05-20 DIAGNOSIS — N2581 Secondary hyperparathyroidism of renal origin: Secondary | ICD-10-CM | POA: Diagnosis not present

## 2020-05-20 DIAGNOSIS — Z992 Dependence on renal dialysis: Secondary | ICD-10-CM | POA: Diagnosis not present

## 2020-05-23 DIAGNOSIS — D509 Iron deficiency anemia, unspecified: Secondary | ICD-10-CM | POA: Diagnosis not present

## 2020-05-23 DIAGNOSIS — Z992 Dependence on renal dialysis: Secondary | ICD-10-CM | POA: Diagnosis not present

## 2020-05-23 DIAGNOSIS — N2581 Secondary hyperparathyroidism of renal origin: Secondary | ICD-10-CM | POA: Diagnosis not present

## 2020-05-23 DIAGNOSIS — D631 Anemia in chronic kidney disease: Secondary | ICD-10-CM | POA: Diagnosis not present

## 2020-05-23 DIAGNOSIS — N186 End stage renal disease: Secondary | ICD-10-CM | POA: Diagnosis not present

## 2020-05-25 DIAGNOSIS — N186 End stage renal disease: Secondary | ICD-10-CM | POA: Diagnosis not present

## 2020-05-25 DIAGNOSIS — Z992 Dependence on renal dialysis: Secondary | ICD-10-CM | POA: Diagnosis not present

## 2020-05-25 DIAGNOSIS — D509 Iron deficiency anemia, unspecified: Secondary | ICD-10-CM | POA: Diagnosis not present

## 2020-05-25 DIAGNOSIS — N2581 Secondary hyperparathyroidism of renal origin: Secondary | ICD-10-CM | POA: Diagnosis not present

## 2020-05-25 DIAGNOSIS — D631 Anemia in chronic kidney disease: Secondary | ICD-10-CM | POA: Diagnosis not present

## 2020-05-27 DIAGNOSIS — Z992 Dependence on renal dialysis: Secondary | ICD-10-CM | POA: Diagnosis not present

## 2020-05-27 DIAGNOSIS — N2581 Secondary hyperparathyroidism of renal origin: Secondary | ICD-10-CM | POA: Diagnosis not present

## 2020-05-27 DIAGNOSIS — N186 End stage renal disease: Secondary | ICD-10-CM | POA: Diagnosis not present

## 2020-05-27 DIAGNOSIS — D631 Anemia in chronic kidney disease: Secondary | ICD-10-CM | POA: Diagnosis not present

## 2020-05-27 DIAGNOSIS — N039 Chronic nephritic syndrome with unspecified morphologic changes: Secondary | ICD-10-CM | POA: Diagnosis not present

## 2020-05-27 DIAGNOSIS — D509 Iron deficiency anemia, unspecified: Secondary | ICD-10-CM | POA: Diagnosis not present

## 2020-05-30 DIAGNOSIS — Z992 Dependence on renal dialysis: Secondary | ICD-10-CM | POA: Diagnosis not present

## 2020-05-30 DIAGNOSIS — D631 Anemia in chronic kidney disease: Secondary | ICD-10-CM | POA: Diagnosis not present

## 2020-05-30 DIAGNOSIS — D509 Iron deficiency anemia, unspecified: Secondary | ICD-10-CM | POA: Diagnosis not present

## 2020-05-30 DIAGNOSIS — N2581 Secondary hyperparathyroidism of renal origin: Secondary | ICD-10-CM | POA: Diagnosis not present

## 2020-05-30 DIAGNOSIS — N186 End stage renal disease: Secondary | ICD-10-CM | POA: Diagnosis not present

## 2020-06-01 DIAGNOSIS — N2581 Secondary hyperparathyroidism of renal origin: Secondary | ICD-10-CM | POA: Diagnosis not present

## 2020-06-01 DIAGNOSIS — D509 Iron deficiency anemia, unspecified: Secondary | ICD-10-CM | POA: Diagnosis not present

## 2020-06-01 DIAGNOSIS — D631 Anemia in chronic kidney disease: Secondary | ICD-10-CM | POA: Diagnosis not present

## 2020-06-01 DIAGNOSIS — Z992 Dependence on renal dialysis: Secondary | ICD-10-CM | POA: Diagnosis not present

## 2020-06-01 DIAGNOSIS — N186 End stage renal disease: Secondary | ICD-10-CM | POA: Diagnosis not present

## 2020-06-03 DIAGNOSIS — N186 End stage renal disease: Secondary | ICD-10-CM | POA: Diagnosis not present

## 2020-06-03 DIAGNOSIS — D631 Anemia in chronic kidney disease: Secondary | ICD-10-CM | POA: Diagnosis not present

## 2020-06-03 DIAGNOSIS — Z992 Dependence on renal dialysis: Secondary | ICD-10-CM | POA: Diagnosis not present

## 2020-06-03 DIAGNOSIS — N2581 Secondary hyperparathyroidism of renal origin: Secondary | ICD-10-CM | POA: Diagnosis not present

## 2020-06-03 DIAGNOSIS — D509 Iron deficiency anemia, unspecified: Secondary | ICD-10-CM | POA: Diagnosis not present

## 2020-06-06 DIAGNOSIS — D631 Anemia in chronic kidney disease: Secondary | ICD-10-CM | POA: Diagnosis not present

## 2020-06-06 DIAGNOSIS — D509 Iron deficiency anemia, unspecified: Secondary | ICD-10-CM | POA: Diagnosis not present

## 2020-06-06 DIAGNOSIS — N2581 Secondary hyperparathyroidism of renal origin: Secondary | ICD-10-CM | POA: Diagnosis not present

## 2020-06-06 DIAGNOSIS — N186 End stage renal disease: Secondary | ICD-10-CM | POA: Diagnosis not present

## 2020-06-06 DIAGNOSIS — Z992 Dependence on renal dialysis: Secondary | ICD-10-CM | POA: Diagnosis not present

## 2020-06-08 DIAGNOSIS — N186 End stage renal disease: Secondary | ICD-10-CM | POA: Diagnosis not present

## 2020-06-08 DIAGNOSIS — Z992 Dependence on renal dialysis: Secondary | ICD-10-CM | POA: Diagnosis not present

## 2020-06-08 DIAGNOSIS — N2581 Secondary hyperparathyroidism of renal origin: Secondary | ICD-10-CM | POA: Diagnosis not present

## 2020-06-08 DIAGNOSIS — D509 Iron deficiency anemia, unspecified: Secondary | ICD-10-CM | POA: Diagnosis not present

## 2020-06-08 DIAGNOSIS — D631 Anemia in chronic kidney disease: Secondary | ICD-10-CM | POA: Diagnosis not present

## 2020-06-10 DIAGNOSIS — D509 Iron deficiency anemia, unspecified: Secondary | ICD-10-CM | POA: Diagnosis not present

## 2020-06-10 DIAGNOSIS — D631 Anemia in chronic kidney disease: Secondary | ICD-10-CM | POA: Diagnosis not present

## 2020-06-10 DIAGNOSIS — Z992 Dependence on renal dialysis: Secondary | ICD-10-CM | POA: Diagnosis not present

## 2020-06-10 DIAGNOSIS — N2581 Secondary hyperparathyroidism of renal origin: Secondary | ICD-10-CM | POA: Diagnosis not present

## 2020-06-10 DIAGNOSIS — N186 End stage renal disease: Secondary | ICD-10-CM | POA: Diagnosis not present

## 2020-06-13 DIAGNOSIS — N2581 Secondary hyperparathyroidism of renal origin: Secondary | ICD-10-CM | POA: Diagnosis not present

## 2020-06-13 DIAGNOSIS — N186 End stage renal disease: Secondary | ICD-10-CM | POA: Diagnosis not present

## 2020-06-13 DIAGNOSIS — Z992 Dependence on renal dialysis: Secondary | ICD-10-CM | POA: Diagnosis not present

## 2020-06-13 DIAGNOSIS — D631 Anemia in chronic kidney disease: Secondary | ICD-10-CM | POA: Diagnosis not present

## 2020-06-13 DIAGNOSIS — D509 Iron deficiency anemia, unspecified: Secondary | ICD-10-CM | POA: Diagnosis not present

## 2020-06-15 DIAGNOSIS — N2581 Secondary hyperparathyroidism of renal origin: Secondary | ICD-10-CM | POA: Diagnosis not present

## 2020-06-15 DIAGNOSIS — Z992 Dependence on renal dialysis: Secondary | ICD-10-CM | POA: Diagnosis not present

## 2020-06-15 DIAGNOSIS — D509 Iron deficiency anemia, unspecified: Secondary | ICD-10-CM | POA: Diagnosis not present

## 2020-06-15 DIAGNOSIS — N186 End stage renal disease: Secondary | ICD-10-CM | POA: Diagnosis not present

## 2020-06-15 DIAGNOSIS — D631 Anemia in chronic kidney disease: Secondary | ICD-10-CM | POA: Diagnosis not present

## 2020-06-17 DIAGNOSIS — D631 Anemia in chronic kidney disease: Secondary | ICD-10-CM | POA: Diagnosis not present

## 2020-06-17 DIAGNOSIS — Z992 Dependence on renal dialysis: Secondary | ICD-10-CM | POA: Diagnosis not present

## 2020-06-17 DIAGNOSIS — D509 Iron deficiency anemia, unspecified: Secondary | ICD-10-CM | POA: Diagnosis not present

## 2020-06-17 DIAGNOSIS — N186 End stage renal disease: Secondary | ICD-10-CM | POA: Diagnosis not present

## 2020-06-17 DIAGNOSIS — N2581 Secondary hyperparathyroidism of renal origin: Secondary | ICD-10-CM | POA: Diagnosis not present

## 2020-06-20 DIAGNOSIS — Z992 Dependence on renal dialysis: Secondary | ICD-10-CM | POA: Diagnosis not present

## 2020-06-20 DIAGNOSIS — D509 Iron deficiency anemia, unspecified: Secondary | ICD-10-CM | POA: Diagnosis not present

## 2020-06-20 DIAGNOSIS — N2581 Secondary hyperparathyroidism of renal origin: Secondary | ICD-10-CM | POA: Diagnosis not present

## 2020-06-20 DIAGNOSIS — D631 Anemia in chronic kidney disease: Secondary | ICD-10-CM | POA: Diagnosis not present

## 2020-06-20 DIAGNOSIS — N186 End stage renal disease: Secondary | ICD-10-CM | POA: Diagnosis not present

## 2020-06-22 DIAGNOSIS — Z992 Dependence on renal dialysis: Secondary | ICD-10-CM | POA: Diagnosis not present

## 2020-06-22 DIAGNOSIS — N186 End stage renal disease: Secondary | ICD-10-CM | POA: Diagnosis not present

## 2020-06-22 DIAGNOSIS — D631 Anemia in chronic kidney disease: Secondary | ICD-10-CM | POA: Diagnosis not present

## 2020-06-22 DIAGNOSIS — D509 Iron deficiency anemia, unspecified: Secondary | ICD-10-CM | POA: Diagnosis not present

## 2020-06-22 DIAGNOSIS — N2581 Secondary hyperparathyroidism of renal origin: Secondary | ICD-10-CM | POA: Diagnosis not present

## 2020-06-24 DIAGNOSIS — D509 Iron deficiency anemia, unspecified: Secondary | ICD-10-CM | POA: Diagnosis not present

## 2020-06-24 DIAGNOSIS — N186 End stage renal disease: Secondary | ICD-10-CM | POA: Diagnosis not present

## 2020-06-24 DIAGNOSIS — Z992 Dependence on renal dialysis: Secondary | ICD-10-CM | POA: Diagnosis not present

## 2020-06-24 DIAGNOSIS — N2581 Secondary hyperparathyroidism of renal origin: Secondary | ICD-10-CM | POA: Diagnosis not present

## 2020-06-24 DIAGNOSIS — D631 Anemia in chronic kidney disease: Secondary | ICD-10-CM | POA: Diagnosis not present

## 2020-06-26 DIAGNOSIS — N186 End stage renal disease: Secondary | ICD-10-CM | POA: Diagnosis not present

## 2020-06-26 DIAGNOSIS — N039 Chronic nephritic syndrome with unspecified morphologic changes: Secondary | ICD-10-CM | POA: Diagnosis not present

## 2020-06-26 DIAGNOSIS — Z992 Dependence on renal dialysis: Secondary | ICD-10-CM | POA: Diagnosis not present

## 2020-06-27 DIAGNOSIS — D509 Iron deficiency anemia, unspecified: Secondary | ICD-10-CM | POA: Diagnosis not present

## 2020-06-27 DIAGNOSIS — N2581 Secondary hyperparathyroidism of renal origin: Secondary | ICD-10-CM | POA: Diagnosis not present

## 2020-06-27 DIAGNOSIS — D631 Anemia in chronic kidney disease: Secondary | ICD-10-CM | POA: Diagnosis not present

## 2020-06-27 DIAGNOSIS — Z992 Dependence on renal dialysis: Secondary | ICD-10-CM | POA: Diagnosis not present

## 2020-06-27 DIAGNOSIS — N186 End stage renal disease: Secondary | ICD-10-CM | POA: Diagnosis not present

## 2020-06-29 DIAGNOSIS — D631 Anemia in chronic kidney disease: Secondary | ICD-10-CM | POA: Diagnosis not present

## 2020-06-29 DIAGNOSIS — Z992 Dependence on renal dialysis: Secondary | ICD-10-CM | POA: Diagnosis not present

## 2020-06-29 DIAGNOSIS — N186 End stage renal disease: Secondary | ICD-10-CM | POA: Diagnosis not present

## 2020-06-29 DIAGNOSIS — N2581 Secondary hyperparathyroidism of renal origin: Secondary | ICD-10-CM | POA: Diagnosis not present

## 2020-06-29 DIAGNOSIS — D509 Iron deficiency anemia, unspecified: Secondary | ICD-10-CM | POA: Diagnosis not present

## 2020-07-01 DIAGNOSIS — N186 End stage renal disease: Secondary | ICD-10-CM | POA: Diagnosis not present

## 2020-07-01 DIAGNOSIS — D509 Iron deficiency anemia, unspecified: Secondary | ICD-10-CM | POA: Diagnosis not present

## 2020-07-01 DIAGNOSIS — N2581 Secondary hyperparathyroidism of renal origin: Secondary | ICD-10-CM | POA: Diagnosis not present

## 2020-07-01 DIAGNOSIS — Z992 Dependence on renal dialysis: Secondary | ICD-10-CM | POA: Diagnosis not present

## 2020-07-01 DIAGNOSIS — D631 Anemia in chronic kidney disease: Secondary | ICD-10-CM | POA: Diagnosis not present

## 2020-07-04 DIAGNOSIS — D631 Anemia in chronic kidney disease: Secondary | ICD-10-CM | POA: Diagnosis not present

## 2020-07-04 DIAGNOSIS — Z992 Dependence on renal dialysis: Secondary | ICD-10-CM | POA: Diagnosis not present

## 2020-07-04 DIAGNOSIS — N2581 Secondary hyperparathyroidism of renal origin: Secondary | ICD-10-CM | POA: Diagnosis not present

## 2020-07-04 DIAGNOSIS — D509 Iron deficiency anemia, unspecified: Secondary | ICD-10-CM | POA: Diagnosis not present

## 2020-07-04 DIAGNOSIS — N186 End stage renal disease: Secondary | ICD-10-CM | POA: Diagnosis not present

## 2020-07-06 DIAGNOSIS — N186 End stage renal disease: Secondary | ICD-10-CM | POA: Diagnosis not present

## 2020-07-06 DIAGNOSIS — N2581 Secondary hyperparathyroidism of renal origin: Secondary | ICD-10-CM | POA: Diagnosis not present

## 2020-07-06 DIAGNOSIS — D509 Iron deficiency anemia, unspecified: Secondary | ICD-10-CM | POA: Diagnosis not present

## 2020-07-06 DIAGNOSIS — Z992 Dependence on renal dialysis: Secondary | ICD-10-CM | POA: Diagnosis not present

## 2020-07-06 DIAGNOSIS — D631 Anemia in chronic kidney disease: Secondary | ICD-10-CM | POA: Diagnosis not present

## 2020-07-08 DIAGNOSIS — N2581 Secondary hyperparathyroidism of renal origin: Secondary | ICD-10-CM | POA: Diagnosis not present

## 2020-07-08 DIAGNOSIS — N186 End stage renal disease: Secondary | ICD-10-CM | POA: Diagnosis not present

## 2020-07-08 DIAGNOSIS — Z992 Dependence on renal dialysis: Secondary | ICD-10-CM | POA: Diagnosis not present

## 2020-07-08 DIAGNOSIS — D509 Iron deficiency anemia, unspecified: Secondary | ICD-10-CM | POA: Diagnosis not present

## 2020-07-08 DIAGNOSIS — D631 Anemia in chronic kidney disease: Secondary | ICD-10-CM | POA: Diagnosis not present

## 2020-07-11 DIAGNOSIS — D631 Anemia in chronic kidney disease: Secondary | ICD-10-CM | POA: Diagnosis not present

## 2020-07-11 DIAGNOSIS — N186 End stage renal disease: Secondary | ICD-10-CM | POA: Diagnosis not present

## 2020-07-11 DIAGNOSIS — N2581 Secondary hyperparathyroidism of renal origin: Secondary | ICD-10-CM | POA: Diagnosis not present

## 2020-07-11 DIAGNOSIS — Z992 Dependence on renal dialysis: Secondary | ICD-10-CM | POA: Diagnosis not present

## 2020-07-11 DIAGNOSIS — D509 Iron deficiency anemia, unspecified: Secondary | ICD-10-CM | POA: Diagnosis not present

## 2020-07-13 DIAGNOSIS — D509 Iron deficiency anemia, unspecified: Secondary | ICD-10-CM | POA: Diagnosis not present

## 2020-07-13 DIAGNOSIS — Z992 Dependence on renal dialysis: Secondary | ICD-10-CM | POA: Diagnosis not present

## 2020-07-13 DIAGNOSIS — N2581 Secondary hyperparathyroidism of renal origin: Secondary | ICD-10-CM | POA: Diagnosis not present

## 2020-07-13 DIAGNOSIS — D631 Anemia in chronic kidney disease: Secondary | ICD-10-CM | POA: Diagnosis not present

## 2020-07-13 DIAGNOSIS — N186 End stage renal disease: Secondary | ICD-10-CM | POA: Diagnosis not present

## 2020-07-15 DIAGNOSIS — D631 Anemia in chronic kidney disease: Secondary | ICD-10-CM | POA: Diagnosis not present

## 2020-07-15 DIAGNOSIS — N186 End stage renal disease: Secondary | ICD-10-CM | POA: Diagnosis not present

## 2020-07-15 DIAGNOSIS — D509 Iron deficiency anemia, unspecified: Secondary | ICD-10-CM | POA: Diagnosis not present

## 2020-07-15 DIAGNOSIS — Z992 Dependence on renal dialysis: Secondary | ICD-10-CM | POA: Diagnosis not present

## 2020-07-15 DIAGNOSIS — N2581 Secondary hyperparathyroidism of renal origin: Secondary | ICD-10-CM | POA: Diagnosis not present

## 2020-07-18 DIAGNOSIS — Z992 Dependence on renal dialysis: Secondary | ICD-10-CM | POA: Diagnosis not present

## 2020-07-18 DIAGNOSIS — D631 Anemia in chronic kidney disease: Secondary | ICD-10-CM | POA: Diagnosis not present

## 2020-07-18 DIAGNOSIS — D509 Iron deficiency anemia, unspecified: Secondary | ICD-10-CM | POA: Diagnosis not present

## 2020-07-18 DIAGNOSIS — N2581 Secondary hyperparathyroidism of renal origin: Secondary | ICD-10-CM | POA: Diagnosis not present

## 2020-07-18 DIAGNOSIS — N186 End stage renal disease: Secondary | ICD-10-CM | POA: Diagnosis not present

## 2020-07-20 DIAGNOSIS — Z992 Dependence on renal dialysis: Secondary | ICD-10-CM | POA: Diagnosis not present

## 2020-07-20 DIAGNOSIS — N2581 Secondary hyperparathyroidism of renal origin: Secondary | ICD-10-CM | POA: Diagnosis not present

## 2020-07-20 DIAGNOSIS — N186 End stage renal disease: Secondary | ICD-10-CM | POA: Diagnosis not present

## 2020-07-20 DIAGNOSIS — D631 Anemia in chronic kidney disease: Secondary | ICD-10-CM | POA: Diagnosis not present

## 2020-07-20 DIAGNOSIS — D509 Iron deficiency anemia, unspecified: Secondary | ICD-10-CM | POA: Diagnosis not present

## 2020-07-22 DIAGNOSIS — D509 Iron deficiency anemia, unspecified: Secondary | ICD-10-CM | POA: Diagnosis not present

## 2020-07-22 DIAGNOSIS — N2581 Secondary hyperparathyroidism of renal origin: Secondary | ICD-10-CM | POA: Diagnosis not present

## 2020-07-22 DIAGNOSIS — N186 End stage renal disease: Secondary | ICD-10-CM | POA: Diagnosis not present

## 2020-07-22 DIAGNOSIS — D631 Anemia in chronic kidney disease: Secondary | ICD-10-CM | POA: Diagnosis not present

## 2020-07-22 DIAGNOSIS — Z992 Dependence on renal dialysis: Secondary | ICD-10-CM | POA: Diagnosis not present

## 2020-07-25 DIAGNOSIS — D509 Iron deficiency anemia, unspecified: Secondary | ICD-10-CM | POA: Diagnosis not present

## 2020-07-25 DIAGNOSIS — D631 Anemia in chronic kidney disease: Secondary | ICD-10-CM | POA: Diagnosis not present

## 2020-07-25 DIAGNOSIS — N186 End stage renal disease: Secondary | ICD-10-CM | POA: Diagnosis not present

## 2020-07-25 DIAGNOSIS — N2581 Secondary hyperparathyroidism of renal origin: Secondary | ICD-10-CM | POA: Diagnosis not present

## 2020-07-25 DIAGNOSIS — Z992 Dependence on renal dialysis: Secondary | ICD-10-CM | POA: Diagnosis not present

## 2020-07-26 DIAGNOSIS — M791 Myalgia, unspecified site: Secondary | ICD-10-CM | POA: Diagnosis not present

## 2020-07-26 DIAGNOSIS — I9589 Other hypotension: Secondary | ICD-10-CM | POA: Diagnosis not present

## 2020-07-26 DIAGNOSIS — R6883 Chills (without fever): Secondary | ICD-10-CM | POA: Diagnosis not present

## 2020-07-26 DIAGNOSIS — R609 Edema, unspecified: Secondary | ICD-10-CM | POA: Diagnosis not present

## 2020-07-27 DIAGNOSIS — Z992 Dependence on renal dialysis: Secondary | ICD-10-CM | POA: Diagnosis not present

## 2020-07-27 DIAGNOSIS — N039 Chronic nephritic syndrome with unspecified morphologic changes: Secondary | ICD-10-CM | POA: Diagnosis not present

## 2020-07-27 DIAGNOSIS — N2581 Secondary hyperparathyroidism of renal origin: Secondary | ICD-10-CM | POA: Diagnosis not present

## 2020-07-27 DIAGNOSIS — D631 Anemia in chronic kidney disease: Secondary | ICD-10-CM | POA: Diagnosis not present

## 2020-07-27 DIAGNOSIS — D509 Iron deficiency anemia, unspecified: Secondary | ICD-10-CM | POA: Diagnosis not present

## 2020-07-27 DIAGNOSIS — N186 End stage renal disease: Secondary | ICD-10-CM | POA: Diagnosis not present

## 2020-07-28 DIAGNOSIS — I871 Compression of vein: Secondary | ICD-10-CM | POA: Diagnosis not present

## 2020-07-28 DIAGNOSIS — Z992 Dependence on renal dialysis: Secondary | ICD-10-CM | POA: Diagnosis not present

## 2020-07-28 DIAGNOSIS — T82858A Stenosis of vascular prosthetic devices, implants and grafts, initial encounter: Secondary | ICD-10-CM | POA: Diagnosis not present

## 2020-07-28 DIAGNOSIS — N186 End stage renal disease: Secondary | ICD-10-CM | POA: Diagnosis not present

## 2020-07-29 DIAGNOSIS — N186 End stage renal disease: Secondary | ICD-10-CM | POA: Diagnosis not present

## 2020-07-29 DIAGNOSIS — D509 Iron deficiency anemia, unspecified: Secondary | ICD-10-CM | POA: Diagnosis not present

## 2020-07-29 DIAGNOSIS — Z992 Dependence on renal dialysis: Secondary | ICD-10-CM | POA: Diagnosis not present

## 2020-07-29 DIAGNOSIS — N2581 Secondary hyperparathyroidism of renal origin: Secondary | ICD-10-CM | POA: Diagnosis not present

## 2020-07-29 DIAGNOSIS — D631 Anemia in chronic kidney disease: Secondary | ICD-10-CM | POA: Diagnosis not present

## 2020-07-30 DIAGNOSIS — N2581 Secondary hyperparathyroidism of renal origin: Secondary | ICD-10-CM | POA: Insufficient documentation

## 2020-07-30 DIAGNOSIS — Z992 Dependence on renal dialysis: Secondary | ICD-10-CM | POA: Diagnosis not present

## 2020-07-30 DIAGNOSIS — I38 Endocarditis, valve unspecified: Secondary | ICD-10-CM | POA: Insufficient documentation

## 2020-07-30 DIAGNOSIS — I9589 Other hypotension: Secondary | ICD-10-CM | POA: Diagnosis not present

## 2020-08-01 DIAGNOSIS — Z992 Dependence on renal dialysis: Secondary | ICD-10-CM | POA: Diagnosis not present

## 2020-08-01 DIAGNOSIS — N186 End stage renal disease: Secondary | ICD-10-CM | POA: Diagnosis not present

## 2020-08-01 DIAGNOSIS — D509 Iron deficiency anemia, unspecified: Secondary | ICD-10-CM | POA: Diagnosis not present

## 2020-08-01 DIAGNOSIS — N2581 Secondary hyperparathyroidism of renal origin: Secondary | ICD-10-CM | POA: Diagnosis not present

## 2020-08-01 DIAGNOSIS — D631 Anemia in chronic kidney disease: Secondary | ICD-10-CM | POA: Diagnosis not present

## 2020-08-03 DIAGNOSIS — N186 End stage renal disease: Secondary | ICD-10-CM | POA: Diagnosis not present

## 2020-08-03 DIAGNOSIS — N2581 Secondary hyperparathyroidism of renal origin: Secondary | ICD-10-CM | POA: Diagnosis not present

## 2020-08-03 DIAGNOSIS — D631 Anemia in chronic kidney disease: Secondary | ICD-10-CM | POA: Diagnosis not present

## 2020-08-03 DIAGNOSIS — D509 Iron deficiency anemia, unspecified: Secondary | ICD-10-CM | POA: Diagnosis not present

## 2020-08-03 DIAGNOSIS — Z992 Dependence on renal dialysis: Secondary | ICD-10-CM | POA: Diagnosis not present

## 2020-08-05 DIAGNOSIS — Z992 Dependence on renal dialysis: Secondary | ICD-10-CM | POA: Diagnosis not present

## 2020-08-05 DIAGNOSIS — N2581 Secondary hyperparathyroidism of renal origin: Secondary | ICD-10-CM | POA: Diagnosis not present

## 2020-08-05 DIAGNOSIS — N186 End stage renal disease: Secondary | ICD-10-CM | POA: Diagnosis not present

## 2020-08-05 DIAGNOSIS — D631 Anemia in chronic kidney disease: Secondary | ICD-10-CM | POA: Diagnosis not present

## 2020-08-05 DIAGNOSIS — D509 Iron deficiency anemia, unspecified: Secondary | ICD-10-CM | POA: Diagnosis not present

## 2020-08-08 DIAGNOSIS — D509 Iron deficiency anemia, unspecified: Secondary | ICD-10-CM | POA: Diagnosis not present

## 2020-08-08 DIAGNOSIS — N186 End stage renal disease: Secondary | ICD-10-CM | POA: Diagnosis not present

## 2020-08-08 DIAGNOSIS — D631 Anemia in chronic kidney disease: Secondary | ICD-10-CM | POA: Diagnosis not present

## 2020-08-08 DIAGNOSIS — Z992 Dependence on renal dialysis: Secondary | ICD-10-CM | POA: Diagnosis not present

## 2020-08-08 DIAGNOSIS — N2581 Secondary hyperparathyroidism of renal origin: Secondary | ICD-10-CM | POA: Diagnosis not present

## 2020-08-10 DIAGNOSIS — D509 Iron deficiency anemia, unspecified: Secondary | ICD-10-CM | POA: Diagnosis not present

## 2020-08-10 DIAGNOSIS — D631 Anemia in chronic kidney disease: Secondary | ICD-10-CM | POA: Diagnosis not present

## 2020-08-10 DIAGNOSIS — Z992 Dependence on renal dialysis: Secondary | ICD-10-CM | POA: Diagnosis not present

## 2020-08-10 DIAGNOSIS — N186 End stage renal disease: Secondary | ICD-10-CM | POA: Diagnosis not present

## 2020-08-10 DIAGNOSIS — N2581 Secondary hyperparathyroidism of renal origin: Secondary | ICD-10-CM | POA: Diagnosis not present

## 2020-08-12 DIAGNOSIS — D509 Iron deficiency anemia, unspecified: Secondary | ICD-10-CM | POA: Diagnosis not present

## 2020-08-12 DIAGNOSIS — Z992 Dependence on renal dialysis: Secondary | ICD-10-CM | POA: Diagnosis not present

## 2020-08-12 DIAGNOSIS — N2581 Secondary hyperparathyroidism of renal origin: Secondary | ICD-10-CM | POA: Diagnosis not present

## 2020-08-12 DIAGNOSIS — D631 Anemia in chronic kidney disease: Secondary | ICD-10-CM | POA: Diagnosis not present

## 2020-08-12 DIAGNOSIS — N186 End stage renal disease: Secondary | ICD-10-CM | POA: Diagnosis not present

## 2020-08-15 DIAGNOSIS — D509 Iron deficiency anemia, unspecified: Secondary | ICD-10-CM | POA: Diagnosis not present

## 2020-08-15 DIAGNOSIS — D631 Anemia in chronic kidney disease: Secondary | ICD-10-CM | POA: Diagnosis not present

## 2020-08-15 DIAGNOSIS — N186 End stage renal disease: Secondary | ICD-10-CM | POA: Diagnosis not present

## 2020-08-15 DIAGNOSIS — Z992 Dependence on renal dialysis: Secondary | ICD-10-CM | POA: Diagnosis not present

## 2020-08-15 DIAGNOSIS — N2581 Secondary hyperparathyroidism of renal origin: Secondary | ICD-10-CM | POA: Diagnosis not present

## 2020-08-17 DIAGNOSIS — D631 Anemia in chronic kidney disease: Secondary | ICD-10-CM | POA: Diagnosis not present

## 2020-08-17 DIAGNOSIS — N2581 Secondary hyperparathyroidism of renal origin: Secondary | ICD-10-CM | POA: Diagnosis not present

## 2020-08-17 DIAGNOSIS — N186 End stage renal disease: Secondary | ICD-10-CM | POA: Diagnosis not present

## 2020-08-17 DIAGNOSIS — D509 Iron deficiency anemia, unspecified: Secondary | ICD-10-CM | POA: Diagnosis not present

## 2020-08-17 DIAGNOSIS — Z992 Dependence on renal dialysis: Secondary | ICD-10-CM | POA: Diagnosis not present

## 2020-08-19 DIAGNOSIS — N186 End stage renal disease: Secondary | ICD-10-CM | POA: Diagnosis not present

## 2020-08-19 DIAGNOSIS — N2581 Secondary hyperparathyroidism of renal origin: Secondary | ICD-10-CM | POA: Diagnosis not present

## 2020-08-19 DIAGNOSIS — D509 Iron deficiency anemia, unspecified: Secondary | ICD-10-CM | POA: Diagnosis not present

## 2020-08-19 DIAGNOSIS — Z992 Dependence on renal dialysis: Secondary | ICD-10-CM | POA: Diagnosis not present

## 2020-08-19 DIAGNOSIS — D631 Anemia in chronic kidney disease: Secondary | ICD-10-CM | POA: Diagnosis not present

## 2020-08-22 DIAGNOSIS — Z992 Dependence on renal dialysis: Secondary | ICD-10-CM | POA: Diagnosis not present

## 2020-08-22 DIAGNOSIS — N186 End stage renal disease: Secondary | ICD-10-CM | POA: Diagnosis not present

## 2020-08-22 DIAGNOSIS — N2581 Secondary hyperparathyroidism of renal origin: Secondary | ICD-10-CM | POA: Diagnosis not present

## 2020-08-22 DIAGNOSIS — D509 Iron deficiency anemia, unspecified: Secondary | ICD-10-CM | POA: Diagnosis not present

## 2020-08-22 DIAGNOSIS — D631 Anemia in chronic kidney disease: Secondary | ICD-10-CM | POA: Diagnosis not present

## 2020-08-24 DIAGNOSIS — N2581 Secondary hyperparathyroidism of renal origin: Secondary | ICD-10-CM | POA: Diagnosis not present

## 2020-08-24 DIAGNOSIS — D631 Anemia in chronic kidney disease: Secondary | ICD-10-CM | POA: Diagnosis not present

## 2020-08-24 DIAGNOSIS — D509 Iron deficiency anemia, unspecified: Secondary | ICD-10-CM | POA: Diagnosis not present

## 2020-08-24 DIAGNOSIS — N186 End stage renal disease: Secondary | ICD-10-CM | POA: Diagnosis not present

## 2020-08-24 DIAGNOSIS — Z992 Dependence on renal dialysis: Secondary | ICD-10-CM | POA: Diagnosis not present

## 2020-08-26 DIAGNOSIS — N186 End stage renal disease: Secondary | ICD-10-CM | POA: Diagnosis not present

## 2020-08-26 DIAGNOSIS — D631 Anemia in chronic kidney disease: Secondary | ICD-10-CM | POA: Diagnosis not present

## 2020-08-26 DIAGNOSIS — N039 Chronic nephritic syndrome with unspecified morphologic changes: Secondary | ICD-10-CM | POA: Diagnosis not present

## 2020-08-26 DIAGNOSIS — N2581 Secondary hyperparathyroidism of renal origin: Secondary | ICD-10-CM | POA: Diagnosis not present

## 2020-08-26 DIAGNOSIS — D509 Iron deficiency anemia, unspecified: Secondary | ICD-10-CM | POA: Diagnosis not present

## 2020-08-26 DIAGNOSIS — Z992 Dependence on renal dialysis: Secondary | ICD-10-CM | POA: Diagnosis not present

## 2020-08-29 DIAGNOSIS — Z992 Dependence on renal dialysis: Secondary | ICD-10-CM | POA: Diagnosis not present

## 2020-08-29 DIAGNOSIS — N2581 Secondary hyperparathyroidism of renal origin: Secondary | ICD-10-CM | POA: Diagnosis not present

## 2020-08-29 DIAGNOSIS — D631 Anemia in chronic kidney disease: Secondary | ICD-10-CM | POA: Diagnosis not present

## 2020-08-29 DIAGNOSIS — D509 Iron deficiency anemia, unspecified: Secondary | ICD-10-CM | POA: Diagnosis not present

## 2020-08-29 DIAGNOSIS — N186 End stage renal disease: Secondary | ICD-10-CM | POA: Diagnosis not present

## 2020-08-29 NOTE — Progress Notes (Signed)
Date:  08/24/2019   ID:  [name] Marks, DOB December 29, 1955, MRN [id number]  Cardiology Office Note:    Date:  08/30/2020   ID:  [name] Marks, DOB Mar 25, 1955, MRN [id number]  PCP:  Eppie Gibson, MD  Cardiologist:  Fransico Him, MD    Referring MD: Patriciaann Clan, DO   Chief Complaint  Patient presents with   Follow-up    Hx of endocarditis, HTN, AVR, TR    History of Present Illness:    [name] Marks is a 65 y.o. 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  This was complicated by severe aortic insufficiency.  She underwent cardiac cath showing normal coronary arteries and underwent bioprosthetic AVR.  Unfortunately she subsequently had a TIA in March 2018.  She has known moderate TR and moderate pulmonary hypertension with last PASP 47 mmHg 02/29/2016.    She is here today for followup and is doing well.  She denies any chest pain or pressure, SOB, DOE, PND, orthopnea, LE edema, dizziness, palpitations or syncope. She is compliant with her meds and is tolerating meds with no SE.     Past Medical History:  Diagnosis Date   Anemia    Aortic valve prosthesis present 02/25/2016   Arthritis    "knees" (01/03/2017)   AVD (aortic valve disease) 07/12/2016   Backache 12/06/2008   Bacteremia due to coagulase-negative Staphylococcus    Carpal tunnel syndrome    Cerebral embolism with transient ischemic attack (TIA)    Cholelithiases 01/28/2017   Chronic female pelvic pain 2/99/2426   Complication of anesthesia    01/01/17- '"a long time ago, difficulty breathimg, not sure if it was due to anesthesia or not."   CVA (cerebral vascular accident) ([place]) 05/16/2016   End stage renal disease on dialysis ([place])    "MWF; [place]." (01/03/2017)   Endocarditis    Esophageal reflux 12/06/2008   GERD (gastroesophageal reflux disease)    Gout    History of blood transfusion  2017   "related to blood poison" (01/03/2017)   Increased endometrial stripe thickness 11/03/2015   Neck pain    Osteoporosis 09/2016   T score -2.6   Pain and swelling of right upper extremity 12/20/2015   Pulmonary HTN ([place]) 07/12/2016   Renal dialysis device, implant, or graft complication 83/41/9622   Renal insufficiency    S/P cholecystectomy 02/14/2017   Septic shock ([place])    Staphylococcus aureus bacteremia    TIA (transient ischemic attack)    "several" (01/03/2017)    Past Surgical History:  Procedure Laterality Date   AORTIC VALVE REPLACEMENT N/A 02/25/2016   Procedure: AORTIC VALVE REPLACEMENT (AVR) implanted with Magna Ease Aortic valve size 86mm;  Surgeon: Melrose Nakayama, MD;  Location: [place];  Service: Open Heart Surgery;  Laterality: N/A;   AV FISTULA PLACEMENT Left 01/03/2017   Procedure: INSERTION OF ARTERIOVENOUS (AV) GORE-TEX GRAFT LEFT THIGH;  Surgeon: Serafina Mitchell, MD;  Location: [place];  Service: Vascular;  Laterality: Left;   [place] Right 02/17/2016   Procedure: REMOVAL OF TWO ARTERIOVENOUS GORETEX GRAFTS ([place]);  Surgeon: Angelia Mould, MD;  Location: [place];  Service: Vascular;  Laterality: Right;   BREAST BIOPSY Left 2013   stereo    BREAST BIOPSY Right 2011   stereo    CARDIAC VALVE REPLACEMENT     CHOLECYSTECTOMY N/A 01/30/2017   Procedure:  LAPAROSCOPIC CHOLECYSTECTOMY;  Surgeon: Erroll Luna, MD;  Location: [place];  Service: General;  Laterality: N/A;   COLONOSCOPY W/ POLYPECTOMY     DG AV DIALYSIS GRAFT DECLOT OR     DILATATION & CURETTAGE/HYSTEROSCOPY WITH TRUECLEAR N/A 11/06/2012   Procedure: DILATATION & CURETTAGE/HYSTEROSCOPY WITH TRUECLEAR;  Surgeon: Terrance Mass, MD;  Location: [facility];  Service: Gynecology;  Laterality: N/A;  Truclear Resectoscopic Polypectomy    INSERTION OF DIALYSIS CATHETER N/A 02/19/2016   Procedure: INSERTION OF Left Internal Jugular DIALYSIS CATHETER;  Surgeon: Angelia Mould, MD;  Location: [place];  Service: Vascular;  Laterality: N/A;   LOOP RECORDER INSERTION N/A 10/01/2016   Procedure: LOOP RECORDER INSERTION;  Surgeon: Constance Haw, MD;  Location: [place] [facility];  Service: Cardiovascular;  Laterality: N/A;   PATCH ANGIOPLASTY Right 02/17/2016   Procedure: PATCH ANGIOPLASTY;  Surgeon: Angelia Mould, MD;  Location: [place];  Service: Vascular;  Laterality: Right;   PERIPHERAL VASCULAR CATHETERIZATION N/A 09/14/2014   Procedure: A/V Shuntogram/Fistulagram;  Surgeon: Katha Cabal, MD;  Location: [place] [facility];  Service: Cardiovascular;  Laterality: N/A;   PERIPHERAL VASCULAR CATHETERIZATION N/A 09/14/2014   Procedure: A/V Shunt Intervention;  Surgeon: Katha Cabal, MD;  Location: [place] [facility];  Service: Cardiovascular;  Laterality: N/A;   PERIPHERAL VASCULAR CATHETERIZATION Right 12/09/2014   Procedure: A/V Shuntogram/Fistulagram;  Surgeon: Algernon Huxley, MD;  Location: [place] [facility];  Service: Cardiovascular;  Laterality: Right;   PERIPHERAL VASCULAR CATHETERIZATION N/A 12/09/2014   Procedure: A/V Shunt Intervention;  Surgeon: Algernon Huxley, MD;  Location: [place] [facility];  Service: Cardiovascular;  Laterality: N/A;   PERIPHERAL VASCULAR CATHETERIZATION Right 05/24/2015   Procedure: A/V Shuntogram;  Surgeon: Serafina Mitchell, MD;  Location: [place] [facility];  Service: Cardiovascular;  Laterality: Right;   PERIPHERAL VASCULAR CATHETERIZATION Right 05/24/2015   Procedure: Peripheral Vascular Balloon Angioplasty;  Surgeon: Serafina Mitchell, MD;  Location: [place] [facility];  Service: Cardiovascular;  Laterality: Right;  right arm shunt   PERIPHERAL VASCULAR CATHETERIZATION N/A 06/13/2015   Procedure: A/V Shuntogram/Fistulagram;  Surgeon: Algernon Huxley, MD;  Location: [place] [facility];  Service: Cardiovascular;  Laterality: N/A;   PERIPHERAL VASCULAR CATHETERIZATION N/A 06/13/2015   Procedure: A/V Shunt Intervention;  Surgeon: Algernon Huxley, MD;   Location: [place] [facility];  Service: Cardiovascular;  Laterality: N/A;   TEE WITHOUT CARDIOVERSION N/A 02/22/2016   Procedure: TRANSESOPHAGEAL ECHOCARDIOGRAM (TEE);  Surgeon: Pixie Casino, MD;  Location[facility];  Service: Cardiovascular;  Laterality: N/A;   TEE WITHOUT CARDIOVERSION N/A 02/25/2016   Procedure: TRANSESOPHAGEAL ECHOCARDIOGRAM (TEE);  Surgeon: Melrose Nakayama, MD;  Location: [place];  Service: Open Heart Surgery;  Laterality: N/A;   TEE WITHOUT CARDIOVERSION N/A 10/01/2016   Procedure: TRANSESOPHAGEAL ECHOCARDIOGRAM (TEE);  Surgeon: Pixie Casino, MD;  [facility];  Service: Cardiovascular;  Laterality: N/A;   TUBAL LIGATION  1983    Current Medications: Current Meds  Medication Sig   albuterol (PROVENTIL HFA;VENTOLIN HFA) 108 (90 Base) MCG/ACT inhaler Inhale 2 puffs into the lungs as needed.   cinacalcet (SENSIPAR) 60 MG tablet Take 60 mg by mouth at bedtime.    clopidogrel (PLAVIX) 75 MG tablet Take 1 tablet (75 mg total) by mouth daily.   famotidine (PEPCID) 20 MG tablet Take 20 mg by mouth 2 (two) times daily. Lunch and dinner   Multiple Vitamins-Minerals (PRORENAL QD PO) Take 1 tablet by  mouth at bedtime.    sevelamer carbonate (RENVELA) 800 MG tablet Take 1,600 mg by mouth 3 (three) times daily with meals.    traMADol (ULTRAM) 50 MG tablet Take 50 mg by mouth every other day.      Allergies:   Contrast media [iodinated diagnostic agents], Ancef [cefazolin], and Naproxen sodium   Social History   Socioeconomic History   Marital status: Divorced    Spouse name: Not on file   Number of children: Not on file   Years of education: Not on file   Highest education level: Not on file  Occupational History   Not on file  Tobacco Use   Smoking status: Never   Smokeless tobacco: Never  Vaping Use   Vaping Use: Never used  Substance and Sexual Activity   Alcohol use: No   Drug use: No   Sexual activity: Never    Birth control/protection:  Post-menopausal    Comment: 1st intercourse 65 yo-Fewer than 5 partners  Other Topics Concern   Not on file  Social History Narrative   Uses cane at home.   Social Determinants of Health   Financial Resource Strain: Not on file  Food Insecurity: Not on file  Transportation Needs: Not on file  Physical Activity: Not on file  Stress: Not on file  Social Connections: Not on file     Family History: The patient's family history includes Alcohol abuse in her father and mother; Breast cancer (age of onset: 66) in her sister; Cancer in her father; Diabetes in her mother; Heart disease in her mother; Hypertension in her mother, sister, and son; Kidney disease in her mother, sister, and son.  ROS:   Please see the history of present illness.    ROS  All other systems reviewed and negative.   EKGs/Labs/Other Studies Reviewed:    The following studies were reviewed today: 2D echo 08/2019  EKG:  EKG is  ordered today.  The EKG demonstrates NSR with nonspecific T wave abnormality  Recent Labs: No results found for requested labs within last 8760 hours.   Recent Lipid Panel    Component Value Date/Time   CHOL 147 09/29/2016 0531   TRIG 83 09/29/2016 0531   HDL 66 09/29/2016 0531   CHOLHDL 2.2 09/29/2016 0531   VLDL 17 09/29/2016 0531   LDLCALC 64 09/29/2016 0531    Physical Exam:    VS:  BP 100/60   Pulse 69   Ht 5' (1.524 m)   Wt 173 lb 3.2 oz (78.6 kg)   SpO2 98%   BMI 33.83 kg/m     Wt Readings from Last 3 Encounters:  08/30/20 173 lb 3.2 oz (78.6 kg)  12/03/19 171 lb (77.6 kg)  08/25/19 167 lb (75.8 kg)  GEN: Well nourished, well developed in no acute distress HEENT: Normal NECK: No JVD; No carotid bruits LYMPHATICS: No lymphadenopathy CARDIAC:RRR, no rubs, gallops.  2/6 SM at LLSB RESPIRATORY:  Clear to auscultation without rales, wheezing or rhonchi  ABDOMEN: Soft, non-tender, non-distended MUSCULOSKELETAL:  No edema; No deformity  SKIN: Warm and  dry NEUROLOGIC:  Alert and oriented x 3 PSYCHIATRIC:  Normal affect   ASSESSMENT:PLAN:    In order of problems listed above:  1.  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  Pulmonary hypertension  -this resolved on echo 01/2017 with PASP 33 mmHg and normal on echo 08/2019  3.  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  Tricuspid regurgitation -moderate by echo 2018 and severe by echo 08/2019 -Referred to structural heart team but this never happened -will repeat 2D echo to followup on TR and assess for right sided enlargement  5.  Atypical chest pain -this is very atypical and has only occurred on HD and she has not had any more since I saw her last T -her cath in 2017 showed normal cors   Disposition:  Follow up in 1 year(s)   Medication Adjustments/Labs and Tests Ordered: Current medicines are reviewed at length with the patient today.  Concerns regarding medicines are outlined above.  Orders Placed This Encounter  Procedures   EKG 12-Lead    No orders of the defined types were placed in this encounter.   Signed, Fransico Him, MD  08/30/2020 10:26 AM    [facility]

## 2020-08-30 ENCOUNTER — Encounter: Payer: Self-pay | Admitting: Cardiology

## 2020-08-30 ENCOUNTER — Ambulatory Visit (INDEPENDENT_AMBULATORY_CARE_PROVIDER_SITE_OTHER): Payer: Medicare Other | Admitting: Cardiology

## 2020-08-30 ENCOUNTER — Other Ambulatory Visit: Payer: Self-pay

## 2020-08-30 VITALS — BP 100/60 | HR 69 | Ht 60.0 in | Wt 173.2 lb

## 2020-08-30 DIAGNOSIS — Z8679 Personal history of other diseases of the circulatory system: Secondary | ICD-10-CM | POA: Diagnosis not present

## 2020-08-30 DIAGNOSIS — I071 Rheumatic tricuspid insufficiency: Secondary | ICD-10-CM | POA: Diagnosis not present

## 2020-08-30 DIAGNOSIS — I359 Nonrheumatic aortic valve disorder, unspecified: Secondary | ICD-10-CM

## 2020-08-30 DIAGNOSIS — I272 Pulmonary hypertension, unspecified: Secondary | ICD-10-CM

## 2020-08-30 NOTE — Patient Instructions (Signed)
Medication Instructions:  Your physician recommends that you continue on your current medications as directed. Please refer to the Current Medication list given to you today.  *If you need a refill on your cardiac medications before your next appointment, please call your pharmacy*   Lab Work: None  If you have labs (blood work) drawn today and your tests are completely normal, you will receive your results only by: [place] (if you have MyChart) OR A paper copy in the mail If you have any lab test that is abnormal or we need to change your treatment, we will call you to review the results.   Testing/Procedures: Your physician has requested that you have an echocardiogram. Echocardiography is a painless test that uses sound waves to create images of your heart. It provides your doctor with information about the size and shape of your heart and how well your heart's chambers and valves are working. This procedure takes approximately one hour. There are no restrictions for this procedure.  Follow-Up: At [facility], you and your health needs are our priority.  As part of our continuing mission to provide you with exceptional heart care, we have created designated Provider Care Teams.  These Care Teams include your primary Cardiologist (physician) and Advanced Practice Providers (APPs -  Physician Assistants and Nurse Practitioners) who all work together to provide you with the care you need, when you need it.  We recommend signing up for the patient portal called "MyChart".  Sign up information is provided on this After Visit Summary.  MyChart is used to connect with patients for Virtual Visits (Telemedicine).  Patients are able to view lab/test results, encounter notes, upcoming appointments, etc.  Non-urgent messages can be sent to your provider as well.   To learn more about what you can do with MyChart, go to [email or web address].    Your next appointment:   1 year(s)  The  format for your next appointment:   In Person  Provider:   Fransico Him, MD   Other Instructions None

## 2020-08-31 DIAGNOSIS — D509 Iron deficiency anemia, unspecified: Secondary | ICD-10-CM | POA: Diagnosis not present

## 2020-08-31 DIAGNOSIS — D631 Anemia in chronic kidney disease: Secondary | ICD-10-CM | POA: Diagnosis not present

## 2020-08-31 DIAGNOSIS — Z992 Dependence on renal dialysis: Secondary | ICD-10-CM | POA: Diagnosis not present

## 2020-08-31 DIAGNOSIS — N2581 Secondary hyperparathyroidism of renal origin: Secondary | ICD-10-CM | POA: Diagnosis not present

## 2020-08-31 DIAGNOSIS — N186 End stage renal disease: Secondary | ICD-10-CM | POA: Diagnosis not present

## 2020-09-02 DIAGNOSIS — N186 End stage renal disease: Secondary | ICD-10-CM | POA: Diagnosis not present

## 2020-09-02 DIAGNOSIS — Z992 Dependence on renal dialysis: Secondary | ICD-10-CM | POA: Diagnosis not present

## 2020-09-02 DIAGNOSIS — D509 Iron deficiency anemia, unspecified: Secondary | ICD-10-CM | POA: Diagnosis not present

## 2020-09-02 DIAGNOSIS — N2581 Secondary hyperparathyroidism of renal origin: Secondary | ICD-10-CM | POA: Diagnosis not present

## 2020-09-02 DIAGNOSIS — D631 Anemia in chronic kidney disease: Secondary | ICD-10-CM | POA: Diagnosis not present

## 2020-09-05 DIAGNOSIS — N186 End stage renal disease: Secondary | ICD-10-CM | POA: Diagnosis not present

## 2020-09-05 DIAGNOSIS — N2581 Secondary hyperparathyroidism of renal origin: Secondary | ICD-10-CM | POA: Diagnosis not present

## 2020-09-05 DIAGNOSIS — D631 Anemia in chronic kidney disease: Secondary | ICD-10-CM | POA: Diagnosis not present

## 2020-09-05 DIAGNOSIS — D509 Iron deficiency anemia, unspecified: Secondary | ICD-10-CM | POA: Diagnosis not present

## 2020-09-05 DIAGNOSIS — Z992 Dependence on renal dialysis: Secondary | ICD-10-CM | POA: Diagnosis not present

## 2020-09-07 DIAGNOSIS — N186 End stage renal disease: Secondary | ICD-10-CM | POA: Diagnosis not present

## 2020-09-07 DIAGNOSIS — D509 Iron deficiency anemia, unspecified: Secondary | ICD-10-CM | POA: Diagnosis not present

## 2020-09-07 DIAGNOSIS — Z992 Dependence on renal dialysis: Secondary | ICD-10-CM | POA: Diagnosis not present

## 2020-09-07 DIAGNOSIS — N2581 Secondary hyperparathyroidism of renal origin: Secondary | ICD-10-CM | POA: Diagnosis not present

## 2020-09-07 DIAGNOSIS — D631 Anemia in chronic kidney disease: Secondary | ICD-10-CM | POA: Diagnosis not present

## 2020-09-09 DIAGNOSIS — N186 End stage renal disease: Secondary | ICD-10-CM | POA: Diagnosis not present

## 2020-09-09 DIAGNOSIS — D509 Iron deficiency anemia, unspecified: Secondary | ICD-10-CM | POA: Diagnosis not present

## 2020-09-09 DIAGNOSIS — D631 Anemia in chronic kidney disease: Secondary | ICD-10-CM | POA: Diagnosis not present

## 2020-09-09 DIAGNOSIS — Z992 Dependence on renal dialysis: Secondary | ICD-10-CM | POA: Diagnosis not present

## 2020-09-09 DIAGNOSIS — N2581 Secondary hyperparathyroidism of renal origin: Secondary | ICD-10-CM | POA: Diagnosis not present

## 2020-09-12 DIAGNOSIS — D509 Iron deficiency anemia, unspecified: Secondary | ICD-10-CM | POA: Diagnosis not present

## 2020-09-12 DIAGNOSIS — Z992 Dependence on renal dialysis: Secondary | ICD-10-CM | POA: Diagnosis not present

## 2020-09-12 DIAGNOSIS — N186 End stage renal disease: Secondary | ICD-10-CM | POA: Diagnosis not present

## 2020-09-12 DIAGNOSIS — D631 Anemia in chronic kidney disease: Secondary | ICD-10-CM | POA: Diagnosis not present

## 2020-09-12 DIAGNOSIS — N2581 Secondary hyperparathyroidism of renal origin: Secondary | ICD-10-CM | POA: Diagnosis not present

## 2020-09-14 DIAGNOSIS — N2581 Secondary hyperparathyroidism of renal origin: Secondary | ICD-10-CM | POA: Diagnosis not present

## 2020-09-14 DIAGNOSIS — D509 Iron deficiency anemia, unspecified: Secondary | ICD-10-CM | POA: Diagnosis not present

## 2020-09-14 DIAGNOSIS — N186 End stage renal disease: Secondary | ICD-10-CM | POA: Diagnosis not present

## 2020-09-14 DIAGNOSIS — D631 Anemia in chronic kidney disease: Secondary | ICD-10-CM | POA: Diagnosis not present

## 2020-09-14 DIAGNOSIS — Z992 Dependence on renal dialysis: Secondary | ICD-10-CM | POA: Diagnosis not present

## 2020-09-16 DIAGNOSIS — D631 Anemia in chronic kidney disease: Secondary | ICD-10-CM | POA: Diagnosis not present

## 2020-09-16 DIAGNOSIS — N186 End stage renal disease: Secondary | ICD-10-CM | POA: Diagnosis not present

## 2020-09-16 DIAGNOSIS — N2581 Secondary hyperparathyroidism of renal origin: Secondary | ICD-10-CM | POA: Diagnosis not present

## 2020-09-16 DIAGNOSIS — Z992 Dependence on renal dialysis: Secondary | ICD-10-CM | POA: Diagnosis not present

## 2020-09-16 DIAGNOSIS — D509 Iron deficiency anemia, unspecified: Secondary | ICD-10-CM | POA: Diagnosis not present

## 2020-09-19 DIAGNOSIS — D509 Iron deficiency anemia, unspecified: Secondary | ICD-10-CM | POA: Diagnosis not present

## 2020-09-19 DIAGNOSIS — N186 End stage renal disease: Secondary | ICD-10-CM | POA: Diagnosis not present

## 2020-09-19 DIAGNOSIS — Z992 Dependence on renal dialysis: Secondary | ICD-10-CM | POA: Diagnosis not present

## 2020-09-19 DIAGNOSIS — D631 Anemia in chronic kidney disease: Secondary | ICD-10-CM | POA: Diagnosis not present

## 2020-09-19 DIAGNOSIS — N2581 Secondary hyperparathyroidism of renal origin: Secondary | ICD-10-CM | POA: Diagnosis not present

## 2020-09-20 ENCOUNTER — Other Ambulatory Visit (HOSPITAL_COMMUNITY): Payer: Medicare Other

## 2020-09-21 DIAGNOSIS — D509 Iron deficiency anemia, unspecified: Secondary | ICD-10-CM | POA: Diagnosis not present

## 2020-09-21 DIAGNOSIS — Z992 Dependence on renal dialysis: Secondary | ICD-10-CM | POA: Diagnosis not present

## 2020-09-21 DIAGNOSIS — N186 End stage renal disease: Secondary | ICD-10-CM | POA: Diagnosis not present

## 2020-09-21 DIAGNOSIS — N2581 Secondary hyperparathyroidism of renal origin: Secondary | ICD-10-CM | POA: Diagnosis not present

## 2020-09-21 DIAGNOSIS — D631 Anemia in chronic kidney disease: Secondary | ICD-10-CM | POA: Diagnosis not present

## 2020-09-23 DIAGNOSIS — N2581 Secondary hyperparathyroidism of renal origin: Secondary | ICD-10-CM | POA: Diagnosis not present

## 2020-09-23 DIAGNOSIS — Z992 Dependence on renal dialysis: Secondary | ICD-10-CM | POA: Diagnosis not present

## 2020-09-23 DIAGNOSIS — D509 Iron deficiency anemia, unspecified: Secondary | ICD-10-CM | POA: Diagnosis not present

## 2020-09-23 DIAGNOSIS — D631 Anemia in chronic kidney disease: Secondary | ICD-10-CM | POA: Diagnosis not present

## 2020-09-23 DIAGNOSIS — N186 End stage renal disease: Secondary | ICD-10-CM | POA: Diagnosis not present

## 2020-09-26 DIAGNOSIS — D509 Iron deficiency anemia, unspecified: Secondary | ICD-10-CM | POA: Diagnosis not present

## 2020-09-26 DIAGNOSIS — N186 End stage renal disease: Secondary | ICD-10-CM | POA: Diagnosis not present

## 2020-09-26 DIAGNOSIS — Z992 Dependence on renal dialysis: Secondary | ICD-10-CM | POA: Diagnosis not present

## 2020-09-26 DIAGNOSIS — N2581 Secondary hyperparathyroidism of renal origin: Secondary | ICD-10-CM | POA: Diagnosis not present

## 2020-09-26 DIAGNOSIS — N039 Chronic nephritic syndrome with unspecified morphologic changes: Secondary | ICD-10-CM | POA: Diagnosis not present

## 2020-09-26 DIAGNOSIS — D631 Anemia in chronic kidney disease: Secondary | ICD-10-CM | POA: Diagnosis not present

## 2020-09-28 DIAGNOSIS — D631 Anemia in chronic kidney disease: Secondary | ICD-10-CM | POA: Diagnosis not present

## 2020-09-28 DIAGNOSIS — Z992 Dependence on renal dialysis: Secondary | ICD-10-CM | POA: Diagnosis not present

## 2020-09-28 DIAGNOSIS — N186 End stage renal disease: Secondary | ICD-10-CM | POA: Diagnosis not present

## 2020-09-28 DIAGNOSIS — N2581 Secondary hyperparathyroidism of renal origin: Secondary | ICD-10-CM | POA: Diagnosis not present

## 2020-09-28 DIAGNOSIS — D509 Iron deficiency anemia, unspecified: Secondary | ICD-10-CM | POA: Diagnosis not present

## 2020-09-30 DIAGNOSIS — N2581 Secondary hyperparathyroidism of renal origin: Secondary | ICD-10-CM | POA: Diagnosis not present

## 2020-09-30 DIAGNOSIS — D631 Anemia in chronic kidney disease: Secondary | ICD-10-CM | POA: Diagnosis not present

## 2020-09-30 DIAGNOSIS — N186 End stage renal disease: Secondary | ICD-10-CM | POA: Diagnosis not present

## 2020-09-30 DIAGNOSIS — Z992 Dependence on renal dialysis: Secondary | ICD-10-CM | POA: Diagnosis not present

## 2020-09-30 DIAGNOSIS — D509 Iron deficiency anemia, unspecified: Secondary | ICD-10-CM | POA: Diagnosis not present

## 2020-10-03 DIAGNOSIS — D509 Iron deficiency anemia, unspecified: Secondary | ICD-10-CM | POA: Diagnosis not present

## 2020-10-03 DIAGNOSIS — D631 Anemia in chronic kidney disease: Secondary | ICD-10-CM | POA: Diagnosis not present

## 2020-10-03 DIAGNOSIS — N186 End stage renal disease: Secondary | ICD-10-CM | POA: Diagnosis not present

## 2020-10-03 DIAGNOSIS — Z992 Dependence on renal dialysis: Secondary | ICD-10-CM | POA: Diagnosis not present

## 2020-10-03 DIAGNOSIS — N2581 Secondary hyperparathyroidism of renal origin: Secondary | ICD-10-CM | POA: Diagnosis not present

## 2020-10-05 DIAGNOSIS — D509 Iron deficiency anemia, unspecified: Secondary | ICD-10-CM | POA: Diagnosis not present

## 2020-10-05 DIAGNOSIS — N186 End stage renal disease: Secondary | ICD-10-CM | POA: Diagnosis not present

## 2020-10-05 DIAGNOSIS — N2581 Secondary hyperparathyroidism of renal origin: Secondary | ICD-10-CM | POA: Diagnosis not present

## 2020-10-05 DIAGNOSIS — Z992 Dependence on renal dialysis: Secondary | ICD-10-CM | POA: Diagnosis not present

## 2020-10-05 DIAGNOSIS — D631 Anemia in chronic kidney disease: Secondary | ICD-10-CM | POA: Diagnosis not present

## 2020-10-07 DIAGNOSIS — N2581 Secondary hyperparathyroidism of renal origin: Secondary | ICD-10-CM | POA: Diagnosis not present

## 2020-10-07 DIAGNOSIS — D509 Iron deficiency anemia, unspecified: Secondary | ICD-10-CM | POA: Diagnosis not present

## 2020-10-07 DIAGNOSIS — N186 End stage renal disease: Secondary | ICD-10-CM | POA: Diagnosis not present

## 2020-10-07 DIAGNOSIS — Z992 Dependence on renal dialysis: Secondary | ICD-10-CM | POA: Diagnosis not present

## 2020-10-07 DIAGNOSIS — D631 Anemia in chronic kidney disease: Secondary | ICD-10-CM | POA: Diagnosis not present

## 2020-10-10 DIAGNOSIS — D509 Iron deficiency anemia, unspecified: Secondary | ICD-10-CM | POA: Diagnosis not present

## 2020-10-10 DIAGNOSIS — Z992 Dependence on renal dialysis: Secondary | ICD-10-CM | POA: Diagnosis not present

## 2020-10-10 DIAGNOSIS — D631 Anemia in chronic kidney disease: Secondary | ICD-10-CM | POA: Diagnosis not present

## 2020-10-10 DIAGNOSIS — N186 End stage renal disease: Secondary | ICD-10-CM | POA: Diagnosis not present

## 2020-10-10 DIAGNOSIS — N2581 Secondary hyperparathyroidism of renal origin: Secondary | ICD-10-CM | POA: Diagnosis not present

## 2020-10-11 DIAGNOSIS — N289 Disorder of kidney and ureter, unspecified: Secondary | ICD-10-CM | POA: Diagnosis not present

## 2020-10-11 DIAGNOSIS — Z992 Dependence on renal dialysis: Secondary | ICD-10-CM | POA: Diagnosis not present

## 2020-10-11 DIAGNOSIS — Z7682 Awaiting organ transplant status: Secondary | ICD-10-CM | POA: Diagnosis not present

## 2020-10-11 DIAGNOSIS — I071 Rheumatic tricuspid insufficiency: Secondary | ICD-10-CM | POA: Diagnosis not present

## 2020-10-11 DIAGNOSIS — I081 Rheumatic disorders of both mitral and tricuspid valves: Secondary | ICD-10-CM | POA: Diagnosis not present

## 2020-10-11 DIAGNOSIS — Z952 Presence of prosthetic heart valve: Secondary | ICD-10-CM | POA: Diagnosis not present

## 2020-10-11 DIAGNOSIS — R931 Abnormal findings on diagnostic imaging of heart and coronary circulation: Secondary | ICD-10-CM | POA: Diagnosis not present

## 2020-10-11 DIAGNOSIS — I361 Nonrheumatic tricuspid (valve) insufficiency: Secondary | ICD-10-CM | POA: Diagnosis not present

## 2020-10-11 DIAGNOSIS — I272 Pulmonary hypertension, unspecified: Secondary | ICD-10-CM | POA: Diagnosis not present

## 2020-10-12 DIAGNOSIS — N2581 Secondary hyperparathyroidism of renal origin: Secondary | ICD-10-CM | POA: Diagnosis not present

## 2020-10-12 DIAGNOSIS — N186 End stage renal disease: Secondary | ICD-10-CM | POA: Diagnosis not present

## 2020-10-12 DIAGNOSIS — D631 Anemia in chronic kidney disease: Secondary | ICD-10-CM | POA: Diagnosis not present

## 2020-10-12 DIAGNOSIS — Z992 Dependence on renal dialysis: Secondary | ICD-10-CM | POA: Diagnosis not present

## 2020-10-12 DIAGNOSIS — D509 Iron deficiency anemia, unspecified: Secondary | ICD-10-CM | POA: Diagnosis not present

## 2020-10-14 DIAGNOSIS — Z992 Dependence on renal dialysis: Secondary | ICD-10-CM | POA: Diagnosis not present

## 2020-10-14 DIAGNOSIS — D631 Anemia in chronic kidney disease: Secondary | ICD-10-CM | POA: Diagnosis not present

## 2020-10-14 DIAGNOSIS — N186 End stage renal disease: Secondary | ICD-10-CM | POA: Diagnosis not present

## 2020-10-14 DIAGNOSIS — D509 Iron deficiency anemia, unspecified: Secondary | ICD-10-CM | POA: Diagnosis not present

## 2020-10-14 DIAGNOSIS — N2581 Secondary hyperparathyroidism of renal origin: Secondary | ICD-10-CM | POA: Diagnosis not present

## 2020-10-17 DIAGNOSIS — N2581 Secondary hyperparathyroidism of renal origin: Secondary | ICD-10-CM | POA: Diagnosis not present

## 2020-10-17 DIAGNOSIS — D509 Iron deficiency anemia, unspecified: Secondary | ICD-10-CM | POA: Diagnosis not present

## 2020-10-17 DIAGNOSIS — D631 Anemia in chronic kidney disease: Secondary | ICD-10-CM | POA: Diagnosis not present

## 2020-10-17 DIAGNOSIS — Z992 Dependence on renal dialysis: Secondary | ICD-10-CM | POA: Diagnosis not present

## 2020-10-17 DIAGNOSIS — N186 End stage renal disease: Secondary | ICD-10-CM | POA: Diagnosis not present

## 2020-10-19 DIAGNOSIS — N2581 Secondary hyperparathyroidism of renal origin: Secondary | ICD-10-CM | POA: Diagnosis not present

## 2020-10-19 DIAGNOSIS — D631 Anemia in chronic kidney disease: Secondary | ICD-10-CM | POA: Diagnosis not present

## 2020-10-19 DIAGNOSIS — D509 Iron deficiency anemia, unspecified: Secondary | ICD-10-CM | POA: Diagnosis not present

## 2020-10-19 DIAGNOSIS — N186 End stage renal disease: Secondary | ICD-10-CM | POA: Diagnosis not present

## 2020-10-19 DIAGNOSIS — Z992 Dependence on renal dialysis: Secondary | ICD-10-CM | POA: Diagnosis not present

## 2020-10-21 DIAGNOSIS — Z992 Dependence on renal dialysis: Secondary | ICD-10-CM | POA: Diagnosis not present

## 2020-10-21 DIAGNOSIS — N2581 Secondary hyperparathyroidism of renal origin: Secondary | ICD-10-CM | POA: Diagnosis not present

## 2020-10-21 DIAGNOSIS — D631 Anemia in chronic kidney disease: Secondary | ICD-10-CM | POA: Diagnosis not present

## 2020-10-21 DIAGNOSIS — N186 End stage renal disease: Secondary | ICD-10-CM | POA: Diagnosis not present

## 2020-10-21 DIAGNOSIS — D509 Iron deficiency anemia, unspecified: Secondary | ICD-10-CM | POA: Diagnosis not present

## 2020-10-24 DIAGNOSIS — Z992 Dependence on renal dialysis: Secondary | ICD-10-CM | POA: Diagnosis not present

## 2020-10-24 DIAGNOSIS — N2581 Secondary hyperparathyroidism of renal origin: Secondary | ICD-10-CM | POA: Diagnosis not present

## 2020-10-24 DIAGNOSIS — D631 Anemia in chronic kidney disease: Secondary | ICD-10-CM | POA: Diagnosis not present

## 2020-10-24 DIAGNOSIS — D509 Iron deficiency anemia, unspecified: Secondary | ICD-10-CM | POA: Diagnosis not present

## 2020-10-24 DIAGNOSIS — N186 End stage renal disease: Secondary | ICD-10-CM | POA: Diagnosis not present

## 2020-10-26 DIAGNOSIS — D509 Iron deficiency anemia, unspecified: Secondary | ICD-10-CM | POA: Diagnosis not present

## 2020-10-26 DIAGNOSIS — N186 End stage renal disease: Secondary | ICD-10-CM | POA: Diagnosis not present

## 2020-10-26 DIAGNOSIS — D631 Anemia in chronic kidney disease: Secondary | ICD-10-CM | POA: Diagnosis not present

## 2020-10-26 DIAGNOSIS — Z992 Dependence on renal dialysis: Secondary | ICD-10-CM | POA: Diagnosis not present

## 2020-10-26 DIAGNOSIS — N2581 Secondary hyperparathyroidism of renal origin: Secondary | ICD-10-CM | POA: Diagnosis not present

## 2020-10-27 DIAGNOSIS — N186 End stage renal disease: Secondary | ICD-10-CM | POA: Diagnosis not present

## 2020-10-27 DIAGNOSIS — N039 Chronic nephritic syndrome with unspecified morphologic changes: Secondary | ICD-10-CM | POA: Diagnosis not present

## 2020-10-27 DIAGNOSIS — Z992 Dependence on renal dialysis: Secondary | ICD-10-CM | POA: Diagnosis not present

## 2020-10-28 DIAGNOSIS — Z992 Dependence on renal dialysis: Secondary | ICD-10-CM | POA: Diagnosis not present

## 2020-10-28 DIAGNOSIS — N186 End stage renal disease: Secondary | ICD-10-CM | POA: Diagnosis not present

## 2020-10-28 DIAGNOSIS — D631 Anemia in chronic kidney disease: Secondary | ICD-10-CM | POA: Diagnosis not present

## 2020-10-28 DIAGNOSIS — D509 Iron deficiency anemia, unspecified: Secondary | ICD-10-CM | POA: Diagnosis not present

## 2020-10-28 DIAGNOSIS — N2581 Secondary hyperparathyroidism of renal origin: Secondary | ICD-10-CM | POA: Diagnosis not present

## 2020-10-31 DIAGNOSIS — D509 Iron deficiency anemia, unspecified: Secondary | ICD-10-CM | POA: Diagnosis not present

## 2020-10-31 DIAGNOSIS — D631 Anemia in chronic kidney disease: Secondary | ICD-10-CM | POA: Diagnosis not present

## 2020-10-31 DIAGNOSIS — Z992 Dependence on renal dialysis: Secondary | ICD-10-CM | POA: Diagnosis not present

## 2020-10-31 DIAGNOSIS — N2581 Secondary hyperparathyroidism of renal origin: Secondary | ICD-10-CM | POA: Diagnosis not present

## 2020-10-31 DIAGNOSIS — N186 End stage renal disease: Secondary | ICD-10-CM | POA: Diagnosis not present

## 2020-11-02 DIAGNOSIS — D631 Anemia in chronic kidney disease: Secondary | ICD-10-CM | POA: Diagnosis not present

## 2020-11-02 DIAGNOSIS — N186 End stage renal disease: Secondary | ICD-10-CM | POA: Diagnosis not present

## 2020-11-02 DIAGNOSIS — D509 Iron deficiency anemia, unspecified: Secondary | ICD-10-CM | POA: Diagnosis not present

## 2020-11-02 DIAGNOSIS — N2581 Secondary hyperparathyroidism of renal origin: Secondary | ICD-10-CM | POA: Diagnosis not present

## 2020-11-02 DIAGNOSIS — Z992 Dependence on renal dialysis: Secondary | ICD-10-CM | POA: Diagnosis not present

## 2020-11-04 DIAGNOSIS — D509 Iron deficiency anemia, unspecified: Secondary | ICD-10-CM | POA: Diagnosis not present

## 2020-11-04 DIAGNOSIS — D631 Anemia in chronic kidney disease: Secondary | ICD-10-CM | POA: Diagnosis not present

## 2020-11-04 DIAGNOSIS — N2581 Secondary hyperparathyroidism of renal origin: Secondary | ICD-10-CM | POA: Diagnosis not present

## 2020-11-04 DIAGNOSIS — N186 End stage renal disease: Secondary | ICD-10-CM | POA: Diagnosis not present

## 2020-11-04 DIAGNOSIS — Z992 Dependence on renal dialysis: Secondary | ICD-10-CM | POA: Diagnosis not present

## 2020-11-07 DIAGNOSIS — N2581 Secondary hyperparathyroidism of renal origin: Secondary | ICD-10-CM | POA: Diagnosis not present

## 2020-11-07 DIAGNOSIS — Z992 Dependence on renal dialysis: Secondary | ICD-10-CM | POA: Diagnosis not present

## 2020-11-07 DIAGNOSIS — N186 End stage renal disease: Secondary | ICD-10-CM | POA: Diagnosis not present

## 2020-11-07 DIAGNOSIS — D509 Iron deficiency anemia, unspecified: Secondary | ICD-10-CM | POA: Diagnosis not present

## 2020-11-07 DIAGNOSIS — D631 Anemia in chronic kidney disease: Secondary | ICD-10-CM | POA: Diagnosis not present

## 2020-11-09 DIAGNOSIS — N2581 Secondary hyperparathyroidism of renal origin: Secondary | ICD-10-CM | POA: Diagnosis not present

## 2020-11-09 DIAGNOSIS — D509 Iron deficiency anemia, unspecified: Secondary | ICD-10-CM | POA: Diagnosis not present

## 2020-11-09 DIAGNOSIS — Z992 Dependence on renal dialysis: Secondary | ICD-10-CM | POA: Diagnosis not present

## 2020-11-09 DIAGNOSIS — N186 End stage renal disease: Secondary | ICD-10-CM | POA: Diagnosis not present

## 2020-11-09 DIAGNOSIS — D631 Anemia in chronic kidney disease: Secondary | ICD-10-CM | POA: Diagnosis not present

## 2020-11-11 DIAGNOSIS — D509 Iron deficiency anemia, unspecified: Secondary | ICD-10-CM | POA: Diagnosis not present

## 2020-11-11 DIAGNOSIS — N186 End stage renal disease: Secondary | ICD-10-CM | POA: Diagnosis not present

## 2020-11-11 DIAGNOSIS — N2581 Secondary hyperparathyroidism of renal origin: Secondary | ICD-10-CM | POA: Diagnosis not present

## 2020-11-11 DIAGNOSIS — D631 Anemia in chronic kidney disease: Secondary | ICD-10-CM | POA: Diagnosis not present

## 2020-11-11 DIAGNOSIS — Z992 Dependence on renal dialysis: Secondary | ICD-10-CM | POA: Diagnosis not present

## 2020-11-14 DIAGNOSIS — Z992 Dependence on renal dialysis: Secondary | ICD-10-CM | POA: Diagnosis not present

## 2020-11-14 DIAGNOSIS — D631 Anemia in chronic kidney disease: Secondary | ICD-10-CM | POA: Diagnosis not present

## 2020-11-14 DIAGNOSIS — N2581 Secondary hyperparathyroidism of renal origin: Secondary | ICD-10-CM | POA: Diagnosis not present

## 2020-11-14 DIAGNOSIS — N186 End stage renal disease: Secondary | ICD-10-CM | POA: Diagnosis not present

## 2020-11-14 DIAGNOSIS — D509 Iron deficiency anemia, unspecified: Secondary | ICD-10-CM | POA: Diagnosis not present

## 2020-11-16 DIAGNOSIS — N186 End stage renal disease: Secondary | ICD-10-CM | POA: Diagnosis not present

## 2020-11-16 DIAGNOSIS — D509 Iron deficiency anemia, unspecified: Secondary | ICD-10-CM | POA: Diagnosis not present

## 2020-11-16 DIAGNOSIS — N2581 Secondary hyperparathyroidism of renal origin: Secondary | ICD-10-CM | POA: Diagnosis not present

## 2020-11-16 DIAGNOSIS — Z992 Dependence on renal dialysis: Secondary | ICD-10-CM | POA: Diagnosis not present

## 2020-11-16 DIAGNOSIS — D631 Anemia in chronic kidney disease: Secondary | ICD-10-CM | POA: Diagnosis not present

## 2020-11-18 DIAGNOSIS — Z992 Dependence on renal dialysis: Secondary | ICD-10-CM | POA: Diagnosis not present

## 2020-11-18 DIAGNOSIS — N2581 Secondary hyperparathyroidism of renal origin: Secondary | ICD-10-CM | POA: Diagnosis not present

## 2020-11-18 DIAGNOSIS — N186 End stage renal disease: Secondary | ICD-10-CM | POA: Diagnosis not present

## 2020-11-18 DIAGNOSIS — D631 Anemia in chronic kidney disease: Secondary | ICD-10-CM | POA: Diagnosis not present

## 2020-11-18 DIAGNOSIS — D509 Iron deficiency anemia, unspecified: Secondary | ICD-10-CM | POA: Diagnosis not present

## 2020-11-21 DIAGNOSIS — D631 Anemia in chronic kidney disease: Secondary | ICD-10-CM | POA: Diagnosis not present

## 2020-11-21 DIAGNOSIS — Z992 Dependence on renal dialysis: Secondary | ICD-10-CM | POA: Diagnosis not present

## 2020-11-21 DIAGNOSIS — N186 End stage renal disease: Secondary | ICD-10-CM | POA: Diagnosis not present

## 2020-11-21 DIAGNOSIS — D509 Iron deficiency anemia, unspecified: Secondary | ICD-10-CM | POA: Diagnosis not present

## 2020-11-21 DIAGNOSIS — N2581 Secondary hyperparathyroidism of renal origin: Secondary | ICD-10-CM | POA: Diagnosis not present

## 2020-11-23 DIAGNOSIS — N186 End stage renal disease: Secondary | ICD-10-CM | POA: Diagnosis not present

## 2020-11-23 DIAGNOSIS — D631 Anemia in chronic kidney disease: Secondary | ICD-10-CM | POA: Diagnosis not present

## 2020-11-23 DIAGNOSIS — Z992 Dependence on renal dialysis: Secondary | ICD-10-CM | POA: Diagnosis not present

## 2020-11-23 DIAGNOSIS — N2581 Secondary hyperparathyroidism of renal origin: Secondary | ICD-10-CM | POA: Diagnosis not present

## 2020-11-23 DIAGNOSIS — D509 Iron deficiency anemia, unspecified: Secondary | ICD-10-CM | POA: Diagnosis not present

## 2020-11-25 DIAGNOSIS — Z992 Dependence on renal dialysis: Secondary | ICD-10-CM | POA: Diagnosis not present

## 2020-11-25 DIAGNOSIS — N186 End stage renal disease: Secondary | ICD-10-CM | POA: Diagnosis not present

## 2020-11-25 DIAGNOSIS — N2581 Secondary hyperparathyroidism of renal origin: Secondary | ICD-10-CM | POA: Diagnosis not present

## 2020-11-25 DIAGNOSIS — D631 Anemia in chronic kidney disease: Secondary | ICD-10-CM | POA: Diagnosis not present

## 2020-11-25 DIAGNOSIS — D509 Iron deficiency anemia, unspecified: Secondary | ICD-10-CM | POA: Diagnosis not present

## 2020-11-26 DIAGNOSIS — Z992 Dependence on renal dialysis: Secondary | ICD-10-CM | POA: Diagnosis not present

## 2020-11-26 DIAGNOSIS — N186 End stage renal disease: Secondary | ICD-10-CM | POA: Diagnosis not present

## 2020-11-26 DIAGNOSIS — N039 Chronic nephritic syndrome with unspecified morphologic changes: Secondary | ICD-10-CM | POA: Diagnosis not present

## 2020-11-28 DIAGNOSIS — D631 Anemia in chronic kidney disease: Secondary | ICD-10-CM | POA: Diagnosis not present

## 2020-11-28 DIAGNOSIS — N2581 Secondary hyperparathyroidism of renal origin: Secondary | ICD-10-CM | POA: Diagnosis not present

## 2020-11-28 DIAGNOSIS — Z23 Encounter for immunization: Secondary | ICD-10-CM | POA: Diagnosis not present

## 2020-11-28 DIAGNOSIS — Z992 Dependence on renal dialysis: Secondary | ICD-10-CM | POA: Diagnosis not present

## 2020-11-28 DIAGNOSIS — N186 End stage renal disease: Secondary | ICD-10-CM | POA: Diagnosis not present

## 2020-11-28 DIAGNOSIS — D509 Iron deficiency anemia, unspecified: Secondary | ICD-10-CM | POA: Diagnosis not present

## 2020-11-30 DIAGNOSIS — D509 Iron deficiency anemia, unspecified: Secondary | ICD-10-CM | POA: Diagnosis not present

## 2020-11-30 DIAGNOSIS — Z23 Encounter for immunization: Secondary | ICD-10-CM | POA: Diagnosis not present

## 2020-11-30 DIAGNOSIS — D631 Anemia in chronic kidney disease: Secondary | ICD-10-CM | POA: Diagnosis not present

## 2020-11-30 DIAGNOSIS — N2581 Secondary hyperparathyroidism of renal origin: Secondary | ICD-10-CM | POA: Diagnosis not present

## 2020-11-30 DIAGNOSIS — N186 End stage renal disease: Secondary | ICD-10-CM | POA: Diagnosis not present

## 2020-11-30 DIAGNOSIS — Z992 Dependence on renal dialysis: Secondary | ICD-10-CM | POA: Diagnosis not present

## 2020-12-01 DIAGNOSIS — M79641 Pain in right hand: Secondary | ICD-10-CM | POA: Diagnosis not present

## 2020-12-01 DIAGNOSIS — M79642 Pain in left hand: Secondary | ICD-10-CM | POA: Diagnosis not present

## 2020-12-01 DIAGNOSIS — G5601 Carpal tunnel syndrome, right upper limb: Secondary | ICD-10-CM | POA: Diagnosis not present

## 2020-12-02 DIAGNOSIS — N2581 Secondary hyperparathyroidism of renal origin: Secondary | ICD-10-CM | POA: Diagnosis not present

## 2020-12-02 DIAGNOSIS — Z23 Encounter for immunization: Secondary | ICD-10-CM | POA: Diagnosis not present

## 2020-12-02 DIAGNOSIS — Z992 Dependence on renal dialysis: Secondary | ICD-10-CM | POA: Diagnosis not present

## 2020-12-02 DIAGNOSIS — N186 End stage renal disease: Secondary | ICD-10-CM | POA: Diagnosis not present

## 2020-12-02 DIAGNOSIS — D509 Iron deficiency anemia, unspecified: Secondary | ICD-10-CM | POA: Diagnosis not present

## 2020-12-02 DIAGNOSIS — D631 Anemia in chronic kidney disease: Secondary | ICD-10-CM | POA: Diagnosis not present

## 2020-12-05 DIAGNOSIS — N2581 Secondary hyperparathyroidism of renal origin: Secondary | ICD-10-CM | POA: Diagnosis not present

## 2020-12-05 DIAGNOSIS — D631 Anemia in chronic kidney disease: Secondary | ICD-10-CM | POA: Diagnosis not present

## 2020-12-05 DIAGNOSIS — D509 Iron deficiency anemia, unspecified: Secondary | ICD-10-CM | POA: Diagnosis not present

## 2020-12-05 DIAGNOSIS — N186 End stage renal disease: Secondary | ICD-10-CM | POA: Diagnosis not present

## 2020-12-05 DIAGNOSIS — Z992 Dependence on renal dialysis: Secondary | ICD-10-CM | POA: Diagnosis not present

## 2020-12-05 DIAGNOSIS — Z23 Encounter for immunization: Secondary | ICD-10-CM | POA: Diagnosis not present

## 2020-12-06 ENCOUNTER — Ambulatory Visit (INDEPENDENT_AMBULATORY_CARE_PROVIDER_SITE_OTHER): Payer: Medicare Other | Admitting: Obstetrics & Gynecology

## 2020-12-06 ENCOUNTER — Encounter: Payer: Self-pay | Admitting: Obstetrics & Gynecology

## 2020-12-06 ENCOUNTER — Other Ambulatory Visit: Payer: Self-pay

## 2020-12-06 VITALS — BP 96/62 | HR 73 | Resp 14 | Ht 59.75 in | Wt 168.0 lb

## 2020-12-06 DIAGNOSIS — Z992 Dependence on renal dialysis: Secondary | ICD-10-CM | POA: Diagnosis not present

## 2020-12-06 DIAGNOSIS — Z01419 Encounter for gynecological examination (general) (routine) without abnormal findings: Secondary | ICD-10-CM | POA: Diagnosis not present

## 2020-12-06 DIAGNOSIS — M8589 Other specified disorders of bone density and structure, multiple sites: Secondary | ICD-10-CM | POA: Diagnosis not present

## 2020-12-06 DIAGNOSIS — N186 End stage renal disease: Secondary | ICD-10-CM | POA: Diagnosis not present

## 2020-12-06 DIAGNOSIS — Z780 Asymptomatic menopausal state: Secondary | ICD-10-CM | POA: Diagnosis not present

## 2020-12-06 NOTE — Progress Notes (Signed)
[name] Marks October 18, 1955 [id number]   History:    65 y.o. G2P2L2 Widowed.  4 grand-children.   RP:  Estalbished patient presenting for annual gyn exam    HPI: Postmenopause, well on no HRT.  No PMB.  H/O Simple Endometrial Hyperplasia on Megace.  EBx benign 02/2018, Megace stopped. Pelvic US 09/2018 showed a thin Endometrial line at 2.6 mm.  No pelvic pain.  Abstinent.  Urine/BMs normal.  Breasts normal.  BMI 33.09.  Needs to exercise more.  Health labs with Fam MD.  Harriet Masson 2016.  Kidney failure on Hemodialysis 3x a week.     Past medical history,surgical history, family history and social history were all reviewed and documented in the EPIC chart.  Gynecologic History No LMP recorded. Patient is postmenopausal.  Obstetric History OB History  Gravida Para Term Preterm AB Living  2 2       2   SAB IAB Ectopic Multiple Live Births               # Outcome Date GA Lbr Len/2nd Weight Sex Delivery Anes PTL Lv  2 Para           1 Para              ROS: A ROS was performed and pertinent positives and negatives are included in the history.  GENERAL: No fevers or chills. HEENT: No change in vision, no earache, sore throat or sinus congestion. NECK: No pain or stiffness. CARDIOVASCULAR: No chest pain or pressure. No palpitations. PULMONARY: No shortness of breath, cough or wheeze. GASTROINTESTINAL: No abdominal pain, nausea, vomiting or diarrhea, melena or bright red blood per rectum. GENITOURINARY: No urinary frequency, urgency, hesitancy or dysuria. MUSCULOSKELETAL: No joint or muscle pain, no back pain, no recent trauma. DERMATOLOGIC: No rash, no itching, no lesions. ENDOCRINE: No polyuria, polydipsia, no heat or cold intolerance. No recent change in weight. HEMATOLOGICAL: No anemia or easy bruising or bleeding. NEUROLOGIC: No headache, seizures, numbness, tingling or weakness. PSYCHIATRIC: No depression, no loss of interest in normal activity or change in sleep pattern.     Exam:   BP  96/62   Pulse 73   Resp 14   Ht 4' 11.75" (1.518 m)   Wt 168 lb (76.2 kg)   BMI 33.09 kg/m   Body mass index is 33.09 kg/m.  General appearance : Well developed well nourished female. No acute distress HEENT: Eyes: no retinal hemorrhage or exudates,  Neck supple, trachea midline, no carotid bruits, no thyroidmegaly Lungs: Clear to auscultation, no rhonchi or wheezes, or rib retractions  Heart: Regular rate and rhythm, no murmurs or gallops Breast:Examined in sitting and supine position were symmetrical in appearance, no palpable masses or tenderness,  no skin retraction, no nipple inversion, no nipple discharge, no skin discoloration, no axillary or supraclavicular lymphadenopathy Abdomen: no palpable masses or tenderness, no rebound or guarding Extremities: no edema or skin discoloration or tenderness  Pelvic: Vulva: Normal             Vagina: No gross lesions or discharge  Cervix: No gross lesions or discharge  Uterus  AV, normal size, shape and consistency, non-tender and mobile  Adnexa  Without masses or tenderness  Anus: Normal   Assessment/Plan:  65 y.o. female for annual exam   1. Well female exam with routine gynecological exam Normal gynecologic exam in menopause.  Pap test - October 2021, no indication to repeat at this time.  Breast exam normal.  Screening  mammogram October 2021 was negative, will repeat now.  Colonoscopy 2016.  Health labs with family physician.  Body mass index 33.09.  Recommend a lower calorie/carb diet.  Aerobic activities 5 times a week and light weightlifting every 2 days.  2. Postmenopause Well on no hormone replacement therapy.  No postmenopausal bleeding. - DG Bone Density; Future  3. Osteopenia of multiple sites Osteopenia on last bone density, will repeat a bone density now.  Vitamin D supplements, calcium intake of 1.5 g/day total and regular weightbearing physical activities. - DG Bone Density; Future  4. ESRD (end stage renal disease)  on dialysis ([place])  Other orders - predniSONE (DELTASONE) 20 MG tablet;  (Patient not taking: Reported on 12/06/2020) - midodrine (PROAMATINE) 10 MG tablet   Princess Bruins MD, 2:52 PM 12/06/2020

## 2020-12-07 DIAGNOSIS — Z23 Encounter for immunization: Secondary | ICD-10-CM | POA: Diagnosis not present

## 2020-12-07 DIAGNOSIS — D509 Iron deficiency anemia, unspecified: Secondary | ICD-10-CM | POA: Diagnosis not present

## 2020-12-07 DIAGNOSIS — Z992 Dependence on renal dialysis: Secondary | ICD-10-CM | POA: Diagnosis not present

## 2020-12-07 DIAGNOSIS — N2581 Secondary hyperparathyroidism of renal origin: Secondary | ICD-10-CM | POA: Diagnosis not present

## 2020-12-07 DIAGNOSIS — N186 End stage renal disease: Secondary | ICD-10-CM | POA: Diagnosis not present

## 2020-12-07 DIAGNOSIS — D631 Anemia in chronic kidney disease: Secondary | ICD-10-CM | POA: Diagnosis not present

## 2020-12-09 ENCOUNTER — Encounter: Payer: Self-pay | Admitting: Obstetrics & Gynecology

## 2020-12-09 DIAGNOSIS — D631 Anemia in chronic kidney disease: Secondary | ICD-10-CM | POA: Diagnosis not present

## 2020-12-09 DIAGNOSIS — Z23 Encounter for immunization: Secondary | ICD-10-CM | POA: Diagnosis not present

## 2020-12-09 DIAGNOSIS — D509 Iron deficiency anemia, unspecified: Secondary | ICD-10-CM | POA: Diagnosis not present

## 2020-12-09 DIAGNOSIS — Z992 Dependence on renal dialysis: Secondary | ICD-10-CM | POA: Diagnosis not present

## 2020-12-09 DIAGNOSIS — N2581 Secondary hyperparathyroidism of renal origin: Secondary | ICD-10-CM | POA: Diagnosis not present

## 2020-12-09 DIAGNOSIS — N186 End stage renal disease: Secondary | ICD-10-CM | POA: Diagnosis not present

## 2020-12-12 DIAGNOSIS — N186 End stage renal disease: Secondary | ICD-10-CM | POA: Diagnosis not present

## 2020-12-12 DIAGNOSIS — D509 Iron deficiency anemia, unspecified: Secondary | ICD-10-CM | POA: Diagnosis not present

## 2020-12-12 DIAGNOSIS — Z992 Dependence on renal dialysis: Secondary | ICD-10-CM | POA: Diagnosis not present

## 2020-12-12 DIAGNOSIS — D631 Anemia in chronic kidney disease: Secondary | ICD-10-CM | POA: Diagnosis not present

## 2020-12-12 DIAGNOSIS — Z23 Encounter for immunization: Secondary | ICD-10-CM | POA: Diagnosis not present

## 2020-12-12 DIAGNOSIS — N2581 Secondary hyperparathyroidism of renal origin: Secondary | ICD-10-CM | POA: Diagnosis not present

## 2020-12-14 DIAGNOSIS — D509 Iron deficiency anemia, unspecified: Secondary | ICD-10-CM | POA: Diagnosis not present

## 2020-12-14 DIAGNOSIS — Z23 Encounter for immunization: Secondary | ICD-10-CM | POA: Diagnosis not present

## 2020-12-14 DIAGNOSIS — D631 Anemia in chronic kidney disease: Secondary | ICD-10-CM | POA: Diagnosis not present

## 2020-12-14 DIAGNOSIS — Z992 Dependence on renal dialysis: Secondary | ICD-10-CM | POA: Diagnosis not present

## 2020-12-14 DIAGNOSIS — N186 End stage renal disease: Secondary | ICD-10-CM | POA: Diagnosis not present

## 2020-12-14 DIAGNOSIS — N2581 Secondary hyperparathyroidism of renal origin: Secondary | ICD-10-CM | POA: Diagnosis not present

## 2020-12-15 DIAGNOSIS — I361 Nonrheumatic tricuspid (valve) insufficiency: Secondary | ICD-10-CM | POA: Diagnosis not present

## 2020-12-16 DIAGNOSIS — D631 Anemia in chronic kidney disease: Secondary | ICD-10-CM | POA: Diagnosis not present

## 2020-12-16 DIAGNOSIS — D509 Iron deficiency anemia, unspecified: Secondary | ICD-10-CM | POA: Diagnosis not present

## 2020-12-16 DIAGNOSIS — N2581 Secondary hyperparathyroidism of renal origin: Secondary | ICD-10-CM | POA: Diagnosis not present

## 2020-12-16 DIAGNOSIS — Z23 Encounter for immunization: Secondary | ICD-10-CM | POA: Diagnosis not present

## 2020-12-16 DIAGNOSIS — Z992 Dependence on renal dialysis: Secondary | ICD-10-CM | POA: Diagnosis not present

## 2020-12-16 DIAGNOSIS — N186 End stage renal disease: Secondary | ICD-10-CM | POA: Diagnosis not present

## 2020-12-19 DIAGNOSIS — Z23 Encounter for immunization: Secondary | ICD-10-CM | POA: Diagnosis not present

## 2020-12-19 DIAGNOSIS — Z992 Dependence on renal dialysis: Secondary | ICD-10-CM | POA: Diagnosis not present

## 2020-12-19 DIAGNOSIS — N2581 Secondary hyperparathyroidism of renal origin: Secondary | ICD-10-CM | POA: Diagnosis not present

## 2020-12-19 DIAGNOSIS — D509 Iron deficiency anemia, unspecified: Secondary | ICD-10-CM | POA: Diagnosis not present

## 2020-12-19 DIAGNOSIS — N186 End stage renal disease: Secondary | ICD-10-CM | POA: Diagnosis not present

## 2020-12-19 DIAGNOSIS — D631 Anemia in chronic kidney disease: Secondary | ICD-10-CM | POA: Diagnosis not present

## 2020-12-21 DIAGNOSIS — D631 Anemia in chronic kidney disease: Secondary | ICD-10-CM | POA: Diagnosis not present

## 2020-12-21 DIAGNOSIS — N2581 Secondary hyperparathyroidism of renal origin: Secondary | ICD-10-CM | POA: Diagnosis not present

## 2020-12-21 DIAGNOSIS — Z992 Dependence on renal dialysis: Secondary | ICD-10-CM | POA: Diagnosis not present

## 2020-12-21 DIAGNOSIS — Z23 Encounter for immunization: Secondary | ICD-10-CM | POA: Diagnosis not present

## 2020-12-21 DIAGNOSIS — N186 End stage renal disease: Secondary | ICD-10-CM | POA: Diagnosis not present

## 2020-12-21 DIAGNOSIS — D509 Iron deficiency anemia, unspecified: Secondary | ICD-10-CM | POA: Diagnosis not present

## 2020-12-23 DIAGNOSIS — Z23 Encounter for immunization: Secondary | ICD-10-CM | POA: Diagnosis not present

## 2020-12-23 DIAGNOSIS — D631 Anemia in chronic kidney disease: Secondary | ICD-10-CM | POA: Diagnosis not present

## 2020-12-23 DIAGNOSIS — N186 End stage renal disease: Secondary | ICD-10-CM | POA: Diagnosis not present

## 2020-12-23 DIAGNOSIS — Z992 Dependence on renal dialysis: Secondary | ICD-10-CM | POA: Diagnosis not present

## 2020-12-23 DIAGNOSIS — D509 Iron deficiency anemia, unspecified: Secondary | ICD-10-CM | POA: Diagnosis not present

## 2020-12-23 DIAGNOSIS — N2581 Secondary hyperparathyroidism of renal origin: Secondary | ICD-10-CM | POA: Diagnosis not present

## 2020-12-26 DIAGNOSIS — N2581 Secondary hyperparathyroidism of renal origin: Secondary | ICD-10-CM | POA: Diagnosis not present

## 2020-12-26 DIAGNOSIS — N186 End stage renal disease: Secondary | ICD-10-CM | POA: Diagnosis not present

## 2020-12-26 DIAGNOSIS — Z23 Encounter for immunization: Secondary | ICD-10-CM | POA: Diagnosis not present

## 2020-12-26 DIAGNOSIS — Z992 Dependence on renal dialysis: Secondary | ICD-10-CM | POA: Diagnosis not present

## 2020-12-26 DIAGNOSIS — D631 Anemia in chronic kidney disease: Secondary | ICD-10-CM | POA: Diagnosis not present

## 2020-12-26 DIAGNOSIS — D509 Iron deficiency anemia, unspecified: Secondary | ICD-10-CM | POA: Diagnosis not present

## 2020-12-27 ENCOUNTER — Ambulatory Visit (INDEPENDENT_AMBULATORY_CARE_PROVIDER_SITE_OTHER): Payer: Medicare Other

## 2020-12-27 ENCOUNTER — Other Ambulatory Visit: Payer: Self-pay | Admitting: Obstetrics & Gynecology

## 2020-12-27 ENCOUNTER — Other Ambulatory Visit: Payer: Self-pay

## 2020-12-27 DIAGNOSIS — M810 Age-related osteoporosis without current pathological fracture: Secondary | ICD-10-CM | POA: Diagnosis not present

## 2020-12-27 DIAGNOSIS — Z780 Asymptomatic menopausal state: Secondary | ICD-10-CM | POA: Diagnosis not present

## 2020-12-27 DIAGNOSIS — M8589 Other specified disorders of bone density and structure, multiple sites: Secondary | ICD-10-CM

## 2020-12-27 DIAGNOSIS — Z992 Dependence on renal dialysis: Secondary | ICD-10-CM | POA: Diagnosis not present

## 2020-12-27 DIAGNOSIS — N039 Chronic nephritic syndrome with unspecified morphologic changes: Secondary | ICD-10-CM | POA: Diagnosis not present

## 2020-12-27 DIAGNOSIS — N186 End stage renal disease: Secondary | ICD-10-CM | POA: Diagnosis not present

## 2020-12-28 DIAGNOSIS — D509 Iron deficiency anemia, unspecified: Secondary | ICD-10-CM | POA: Diagnosis not present

## 2020-12-28 DIAGNOSIS — N186 End stage renal disease: Secondary | ICD-10-CM | POA: Diagnosis not present

## 2020-12-28 DIAGNOSIS — D631 Anemia in chronic kidney disease: Secondary | ICD-10-CM | POA: Diagnosis not present

## 2020-12-28 DIAGNOSIS — Z992 Dependence on renal dialysis: Secondary | ICD-10-CM | POA: Diagnosis not present

## 2020-12-28 DIAGNOSIS — N2581 Secondary hyperparathyroidism of renal origin: Secondary | ICD-10-CM | POA: Diagnosis not present

## 2020-12-29 DIAGNOSIS — M79642 Pain in left hand: Secondary | ICD-10-CM | POA: Diagnosis not present

## 2020-12-29 DIAGNOSIS — G5603 Carpal tunnel syndrome, bilateral upper limbs: Secondary | ICD-10-CM | POA: Diagnosis not present

## 2020-12-29 DIAGNOSIS — M79641 Pain in right hand: Secondary | ICD-10-CM | POA: Diagnosis not present

## 2020-12-30 DIAGNOSIS — D631 Anemia in chronic kidney disease: Secondary | ICD-10-CM | POA: Diagnosis not present

## 2020-12-30 DIAGNOSIS — N186 End stage renal disease: Secondary | ICD-10-CM | POA: Diagnosis not present

## 2020-12-30 DIAGNOSIS — Z992 Dependence on renal dialysis: Secondary | ICD-10-CM | POA: Diagnosis not present

## 2020-12-30 DIAGNOSIS — D509 Iron deficiency anemia, unspecified: Secondary | ICD-10-CM | POA: Diagnosis not present

## 2020-12-30 DIAGNOSIS — N2581 Secondary hyperparathyroidism of renal origin: Secondary | ICD-10-CM | POA: Diagnosis not present

## 2021-01-02 DIAGNOSIS — D631 Anemia in chronic kidney disease: Secondary | ICD-10-CM | POA: Diagnosis not present

## 2021-01-02 DIAGNOSIS — N186 End stage renal disease: Secondary | ICD-10-CM | POA: Diagnosis not present

## 2021-01-02 DIAGNOSIS — Z992 Dependence on renal dialysis: Secondary | ICD-10-CM | POA: Diagnosis not present

## 2021-01-02 DIAGNOSIS — D509 Iron deficiency anemia, unspecified: Secondary | ICD-10-CM | POA: Diagnosis not present

## 2021-01-02 DIAGNOSIS — N2581 Secondary hyperparathyroidism of renal origin: Secondary | ICD-10-CM | POA: Diagnosis not present

## 2021-01-04 DIAGNOSIS — N186 End stage renal disease: Secondary | ICD-10-CM | POA: Diagnosis not present

## 2021-01-04 DIAGNOSIS — N2581 Secondary hyperparathyroidism of renal origin: Secondary | ICD-10-CM | POA: Diagnosis not present

## 2021-01-04 DIAGNOSIS — D631 Anemia in chronic kidney disease: Secondary | ICD-10-CM | POA: Diagnosis not present

## 2021-01-04 DIAGNOSIS — Z992 Dependence on renal dialysis: Secondary | ICD-10-CM | POA: Diagnosis not present

## 2021-01-04 DIAGNOSIS — D509 Iron deficiency anemia, unspecified: Secondary | ICD-10-CM | POA: Diagnosis not present

## 2021-01-05 ENCOUNTER — Telehealth: Payer: Self-pay | Admitting: *Deleted

## 2021-01-05 NOTE — Telephone Encounter (Signed)
   Pre-operative Risk Assessment    Patient Name: [name] Marks  DOB: Jun 07, 1955 MRN: [id number]      Request for Surgical Clearance   Procedure:   COLONOSCOPY  Date of Surgery: Clearance 02/09/21                                 Surgeon:  DR. Michail Sermon Surgeon's Group or Practice Name:  [facility] Phone number:  [phone] Fax number:  [phone]   Type of Clearance Requested: - Medical  - Pharmacy:  Hold Clopidogrel (Plavix)     Type of Anesthesia:   PROPOFOL   Additional requests/questions:   Jiles Prows   01/05/2021, 11:44 AM

## 2021-01-05 NOTE — Telephone Encounter (Signed)
    Patient Name: [name] Marks  DOB: 30-Sep-1955 MRN: [id number]  Primary Cardiologist: Fransico Him, MD  Chart reviewed as part of pre-operative protocol coverage.   Hx of severe AR secondary to endocarditis s/p bioprosthetic AVR  Per note "continue prescription drug management with Plavix 75mg  daily instead of aspirin given hx of TIA with PRN refills".  Dr. Radford Pax, Please give your recommendations regarding Plavix. Please forward your response to P [facility] PREOP.   Thank you   Leanor Kail, PA 01/05/2021, 12:17 PM

## 2021-01-06 DIAGNOSIS — D509 Iron deficiency anemia, unspecified: Secondary | ICD-10-CM | POA: Diagnosis not present

## 2021-01-06 DIAGNOSIS — N2581 Secondary hyperparathyroidism of renal origin: Secondary | ICD-10-CM | POA: Diagnosis not present

## 2021-01-06 DIAGNOSIS — D631 Anemia in chronic kidney disease: Secondary | ICD-10-CM | POA: Diagnosis not present

## 2021-01-06 DIAGNOSIS — N186 End stage renal disease: Secondary | ICD-10-CM | POA: Diagnosis not present

## 2021-01-06 DIAGNOSIS — Z992 Dependence on renal dialysis: Secondary | ICD-10-CM | POA: Diagnosis not present

## 2021-01-09 DIAGNOSIS — N186 End stage renal disease: Secondary | ICD-10-CM | POA: Diagnosis not present

## 2021-01-09 DIAGNOSIS — Z992 Dependence on renal dialysis: Secondary | ICD-10-CM | POA: Diagnosis not present

## 2021-01-09 DIAGNOSIS — N2581 Secondary hyperparathyroidism of renal origin: Secondary | ICD-10-CM | POA: Diagnosis not present

## 2021-01-09 DIAGNOSIS — D509 Iron deficiency anemia, unspecified: Secondary | ICD-10-CM | POA: Diagnosis not present

## 2021-01-09 DIAGNOSIS — D631 Anemia in chronic kidney disease: Secondary | ICD-10-CM | POA: Diagnosis not present

## 2021-01-11 DIAGNOSIS — Z992 Dependence on renal dialysis: Secondary | ICD-10-CM | POA: Diagnosis not present

## 2021-01-11 DIAGNOSIS — N2581 Secondary hyperparathyroidism of renal origin: Secondary | ICD-10-CM | POA: Diagnosis not present

## 2021-01-11 DIAGNOSIS — N186 End stage renal disease: Secondary | ICD-10-CM | POA: Diagnosis not present

## 2021-01-11 DIAGNOSIS — D631 Anemia in chronic kidney disease: Secondary | ICD-10-CM | POA: Diagnosis not present

## 2021-01-11 DIAGNOSIS — D509 Iron deficiency anemia, unspecified: Secondary | ICD-10-CM | POA: Diagnosis not present

## 2021-01-13 DIAGNOSIS — N186 End stage renal disease: Secondary | ICD-10-CM | POA: Diagnosis not present

## 2021-01-13 DIAGNOSIS — Z992 Dependence on renal dialysis: Secondary | ICD-10-CM | POA: Diagnosis not present

## 2021-01-13 DIAGNOSIS — D631 Anemia in chronic kidney disease: Secondary | ICD-10-CM | POA: Diagnosis not present

## 2021-01-13 DIAGNOSIS — D509 Iron deficiency anemia, unspecified: Secondary | ICD-10-CM | POA: Diagnosis not present

## 2021-01-13 DIAGNOSIS — N2581 Secondary hyperparathyroidism of renal origin: Secondary | ICD-10-CM | POA: Diagnosis not present

## 2021-01-16 DIAGNOSIS — D631 Anemia in chronic kidney disease: Secondary | ICD-10-CM | POA: Diagnosis not present

## 2021-01-16 DIAGNOSIS — D509 Iron deficiency anemia, unspecified: Secondary | ICD-10-CM | POA: Diagnosis not present

## 2021-01-16 DIAGNOSIS — N186 End stage renal disease: Secondary | ICD-10-CM | POA: Diagnosis not present

## 2021-01-16 DIAGNOSIS — Z992 Dependence on renal dialysis: Secondary | ICD-10-CM | POA: Diagnosis not present

## 2021-01-16 DIAGNOSIS — N2581 Secondary hyperparathyroidism of renal origin: Secondary | ICD-10-CM | POA: Diagnosis not present

## 2021-01-18 DIAGNOSIS — Z992 Dependence on renal dialysis: Secondary | ICD-10-CM | POA: Diagnosis not present

## 2021-01-18 DIAGNOSIS — N186 End stage renal disease: Secondary | ICD-10-CM | POA: Diagnosis not present

## 2021-01-18 DIAGNOSIS — D509 Iron deficiency anemia, unspecified: Secondary | ICD-10-CM | POA: Diagnosis not present

## 2021-01-18 DIAGNOSIS — D631 Anemia in chronic kidney disease: Secondary | ICD-10-CM | POA: Diagnosis not present

## 2021-01-18 DIAGNOSIS — N2581 Secondary hyperparathyroidism of renal origin: Secondary | ICD-10-CM | POA: Diagnosis not present

## 2021-01-21 ENCOUNTER — Ambulatory Visit (HOSPITAL_COMMUNITY)
Admission: EM | Admit: 2021-01-21 | Discharge: 2021-01-21 | Disposition: A | Discharge status: Home / Self Care | Payer: Medicare Other | Attending: Emergency Medicine | Admitting: Emergency Medicine

## 2021-01-21 ENCOUNTER — Other Ambulatory Visit: Payer: Self-pay

## 2021-01-21 ENCOUNTER — Encounter (HOSPITAL_COMMUNITY): Payer: Self-pay

## 2021-01-21 DIAGNOSIS — N2581 Secondary hyperparathyroidism of renal origin: Secondary | ICD-10-CM | POA: Diagnosis not present

## 2021-01-21 DIAGNOSIS — Z20822 Contact with and (suspected) exposure to covid-19: Secondary | ICD-10-CM | POA: Insufficient documentation

## 2021-01-21 DIAGNOSIS — D509 Iron deficiency anemia, unspecified: Secondary | ICD-10-CM | POA: Diagnosis not present

## 2021-01-21 DIAGNOSIS — D631 Anemia in chronic kidney disease: Secondary | ICD-10-CM | POA: Diagnosis not present

## 2021-01-21 DIAGNOSIS — Z992 Dependence on renal dialysis: Secondary | ICD-10-CM | POA: Diagnosis not present

## 2021-01-21 DIAGNOSIS — N186 End stage renal disease: Secondary | ICD-10-CM | POA: Diagnosis not present

## 2021-01-21 DIAGNOSIS — J069 Acute upper respiratory infection, unspecified: Secondary | ICD-10-CM | POA: Insufficient documentation

## 2021-01-21 LAB — POC INFLUENZA A AND B ANTIGEN (URGENT CARE ONLY)
INFLUENZA A ANTIGEN, POC: NEGATIVE
INFLUENZA B ANTIGEN, POC: NEGATIVE

## 2021-01-21 MED ORDER — BENZONATATE 100 MG PO CAPS
100.0000 mg | ORAL_CAPSULE | Freq: Three times a day (TID) | ORAL | 0 refills | Status: DC | PRN
Start: 2021-01-21 — End: 2021-03-16

## 2021-01-21 NOTE — Discharge Instructions (Addendum)
We will notify you of your test results as they arrive and may take between 48-72 hours.  I encourage you to sign up for MyChart if you have not already done so as this can be the easiest way for [place] to communicate results to you online or through a phone app.  Generally, we only contact you if it is a positive test result.  In the meantime, if you develop worsening symptoms including fever, chest pain, shortness of breath despite our current treatment plan then please report to the emergency room as this may be a sign of worsening status from possible viral infection.  Otherwise, we will manage this as a viral syndrome. For sore throat or cough try using a honey-based tea. Use 3 teaspoons of honey with juice squeezed from half lemon. Place shaved pieces of ginger into 1/2-1 cup of water and warm over stove top. Then mix the ingredients and repeat every 4 hours as needed. Please take Tylenol 500mg -650mg  every 6 hours for aches and pains, fevers. Hydrate very well with at least 2 liters of water. Eat light meals such as soups to replenish electrolytes and soft fruits, veggies. Use the cough capsules to help you with your cough.

## 2021-01-21 NOTE — ED Provider Notes (Signed)
[place]   MRN: [id number] DOB: September 14, 1955  Subjective:   [name] Marks is a 65 y.o. female with pmh of ESRD presenting for 2 day history of acute onset cough, sinus drainage, throat pain, chills. No chest pain, shob, wheezing. Has been using Tylenol. No sick contacts to her knowledge. No history of respiratory disorders. No smoking.   No current facility-administered medications for this encounter.  Current Outpatient Medications:    albuterol (PROVENTIL HFA;VENTOLIN HFA) 108 (90 Base) MCG/ACT inhaler, Inhale 2 puffs into the lungs as needed., Disp: , Rfl:    cinacalcet (SENSIPAR) 60 MG tablet, Take 60 mg by mouth at bedtime. , Disp: , Rfl:    clopidogrel (PLAVIX) 75 MG tablet, Take 1 tablet (75 mg total) by mouth daily., Disp: 30 tablet, Rfl: 3   famotidine (PEPCID) 20 MG tablet, Take 20 mg by mouth 2 (two) times daily. Lunch and dinner, Disp: , Rfl:    midodrine (PROAMATINE) 10 MG tablet, , Disp: , Rfl:    Multiple Vitamins-Minerals (PRORENAL QD PO), Take 1 tablet by mouth at bedtime. , Disp: , Rfl:    predniSONE (DELTASONE) 20 MG tablet, , Disp: , Rfl:    sevelamer carbonate (RENVELA) 800 MG tablet, Take 1,600 mg by mouth 3 (three) times daily with meals. , Disp: , Rfl:    Allergies  Allergen Reactions   Iodinated Diagnostic Agents Anaphylaxis and Hives   Ancef [Cefazolin] Other (See Comments)    Pt states she can not tolerate   Naproxen Sodium Itching    Past Medical History:  Diagnosis Date   Anemia    Aortic valve prosthesis present 02/25/2016   Arthritis    "knees" (01/03/2017)   AVD (aortic valve disease) 07/12/2016   Backache 12/06/2008   Bacteremia due to coagulase-negative Staphylococcus    Carpal tunnel syndrome    Cerebral embolism with transient ischemic attack (TIA)    Cholelithiases 01/28/2017   Chronic female pelvic pain 1/88/4166   Complication of anesthesia    01/01/17- '"a long time ago, difficulty breathimg, not sure if it was due  to anesthesia or not."   CVA (cerebral vascular accident) ([place]) 05/16/2016   End stage renal disease on dialysis ([place])    "MWF; [place]." (01/03/2017)   Endocarditis    Esophageal reflux 12/06/2008   GERD (gastroesophageal reflux disease)    Gout    History of blood transfusion 2017   "related to blood poison" (01/03/2017)   Increased endometrial stripe thickness 11/03/2015   Neck pain    Osteoporosis 09/2016   T score -2.6   Pain and swelling of right upper extremity 12/20/2015   Pulmonary HTN ([place]) 07/12/2016   Renal dialysis device, implant, or graft complication 08/26/1599   Renal insufficiency    S/P cholecystectomy 02/14/2017   Septic shock ([place])    Staphylococcus aureus bacteremia    TIA (transient ischemic attack)    "several" (01/03/2017)     Past Surgical History:  Procedure Laterality Date   AORTIC VALVE REPLACEMENT N/A 02/25/2016   Procedure: AORTIC VALVE REPLACEMENT (AVR) implanted with Magna Ease Aortic valve size 7mm;  Surgeon: Melrose Nakayama, MD;  Location: [facility];  Service: Open Heart Surgery;  Laterality: N/A;   AV FISTULA PLACEMENT Left 01/03/2017   Procedure: INSERTION OF ARTERIOVENOUS (AV) GORE-TEX GRAFT LEFT THIGH;  Surgeon: Serafina Mitchell, MD;  Location: [facility];  Service: Vascular;  Laterality: Left;   [place] Right 02/17/2016   Procedure: REMOVAL OF  TWO ARTERIOVENOUS GORETEX GRAFTS ([place]);  Surgeon: Angelia Mould, MD;  Location: [facility];  Service: Vascular;  Laterality: Right;   BREAST BIOPSY Left 2013   stereo    BREAST BIOPSY Right 2011   stereo    CARDIAC VALVE REPLACEMENT     CHOLECYSTECTOMY N/A 01/30/2017   Procedure: LAPAROSCOPIC CHOLECYSTECTOMY;  Surgeon: Erroll Luna, MD;  Location: [facility];  Service: General;  Laterality: N/A;   COLONOSCOPY W/ POLYPECTOMY     DG AV DIALYSIS GRAFT DECLOT OR     DILATATION & CURETTAGE/HYSTEROSCOPY WITH TRUECLEAR N/A 11/06/2012   Procedure: DILATATION & CURETTAGE/HYSTEROSCOPY WITH TRUECLEAR;   Surgeon: Terrance Mass, MD;  Location: [facility];  Service: Gynecology;  Laterality: N/A;  Truclear Resectoscopic Polypectomy    INSERTION OF DIALYSIS CATHETER N/A 02/19/2016   Procedure: INSERTION OF Left Internal Jugular DIALYSIS CATHETER;  Surgeon: Angelia Mould, MD;  Location: [facility];  Service: Vascular;  Laterality: N/A;   LOOP RECORDER INSERTION N/A 10/01/2016   Procedure: LOOP RECORDER INSERTION;  Surgeon: Constance Haw, MD;  Location: [facility];  Service: Cardiovascular;  Laterality: N/A;   PATCH ANGIOPLASTY Right 02/17/2016   Procedure: PATCH ANGIOPLASTY;  Surgeon: Angelia Mould, MD;  Location: [facility];  Service: Vascular;  Laterality: Right;   PERIPHERAL VASCULAR CATHETERIZATION N/A 09/14/2014   Procedure: A/V Shuntogram/Fistulagram;  Surgeon: Katha Cabal, MD;  Location: [facility];  Service: Cardiovascular;  Laterality: N/A;   PERIPHERAL VASCULAR CATHETERIZATION N/A 09/14/2014   Procedure: A/V Shunt Intervention;  Surgeon: Katha Cabal, MD;  Location: [facility];  Service: Cardiovascular;  Laterality: N/A;   PERIPHERAL VASCULAR CATHETERIZATION Right 12/09/2014   Procedure: A/V Shuntogram/Fistulagram;  Surgeon: Algernon Huxley, MD;  Location: [facility];  Service: Cardiovascular;  Laterality: Right;   PERIPHERAL VASCULAR CATHETERIZATION N/A 12/09/2014   Procedure: A/V Shunt Intervention;  Surgeon: Algernon Huxley, MD;  Location: [facility];  Service: Cardiovascular;  Laterality: N/A;   PERIPHERAL VASCULAR CATHETERIZATION Right 05/24/2015   Procedure: A/V Shuntogram;  Surgeon: Serafina Mitchell, MD;  Location: [facility];  Service: Cardiovascular;  Laterality: Right;   PERIPHERAL VASCULAR CATHETERIZATION Right 05/24/2015   Procedure: Peripheral Vascular Balloon Angioplasty;  Surgeon: Serafina Mitchell, MD;  Location: [facility];  Service: Cardiovascular;  Laterality: Right;  right arm shunt   PERIPHERAL  VASCULAR CATHETERIZATION N/A 06/13/2015   Procedure: A/V Shuntogram/Fistulagram;  Surgeon: Algernon Huxley, MD;  Location: [facility];  Service: Cardiovascular;  Laterality: N/A;   PERIPHERAL VASCULAR CATHETERIZATION N/A 06/13/2015   Procedure: A/V Shunt Intervention;  Surgeon: Algernon Huxley, MD;  Location: [facility];  Service: Cardiovascular;  Laterality: N/A;   TEE WITHOUT CARDIOVERSION N/A 02/22/2016   Procedure: TRANSESOPHAGEAL ECHOCARDIOGRAM (TEE);  Surgeon: Pixie Casino, MD;  [facility];  Service: Cardiovascular;  Laterality: N/A;   TEE WITHOUT CARDIOVERSION N/A 02/25/2016   Procedure: TRANSESOPHAGEAL ECHOCARDIOGRAM (TEE);  Surgeon: Melrose Nakayama, MD;  Location: [facility];  Service: Open Heart Surgery;  Laterality: N/A;   TEE WITHOUT CARDIOVERSION N/A 10/01/2016   Procedure: TRANSESOPHAGEAL ECHOCARDIOGRAM (TEE);  Surgeon: Pixie Casino, MD;  Location: [facility];  Service: Cardiovascular;  Laterality: N/A;   TUBAL LIGATION  1983    Family History  Problem Relation Age of Onset   Diabetes Mother    Hypertension Mother    Heart disease Mother    Alcohol abuse Mother  Kidney disease Mother    Cancer Father        COLON   Alcohol abuse Father    Breast cancer Sister 63   Hypertension Sister    Kidney disease Sister    Hypertension Son    Kidney disease Son     Social History   Tobacco Use   Smoking status: Never   Smokeless tobacco: Never  Vaping Use   Vaping Use: Never used  Substance Use Topics   Alcohol use: No   Drug use: No    ROS   Objective:   Vitals: BP 116/60 (BP Location: Right Leg)   Pulse 62   Temp 98.9 F (37.2 C) (Oral)   Resp 17   SpO2 99%   Physical Exam Constitutional:      General: She is not in acute distress.    Appearance: Normal appearance. She is well-developed. She is not ill-appearing, toxic-appearing or diaphoretic.  HENT:     Head: Normocephalic and atraumatic.     Nose: Nose normal.      Mouth/Throat:     Mouth: Mucous membranes are moist.  Eyes:     Extraocular Movements: Extraocular movements intact.     Pupils: Pupils are equal, round, and reactive to light.  Cardiovascular:     Rate and Rhythm: Normal rate and regular rhythm.     Pulses: Normal pulses.     Heart sounds: Normal heart sounds. No murmur heard.   No friction rub. No gallop.  Pulmonary:     Effort: Pulmonary effort is normal. No respiratory distress.     Breath sounds: Normal breath sounds. No stridor. No wheezing, rhonchi or rales.  Skin:    General: Skin is warm and dry.     Findings: No rash.  Neurological:     Mental Status: She is alert and oriented to person, place, and time.  Psychiatric:        Mood and Affect: Mood normal.        Behavior: Behavior normal.        Thought Content: Thought content normal.   Results for orders placed or performed during the hospital encounter of 01/21/21 (from the past 24 hour(s))  POC Influenza A & B Ag (Urgent Care)     Status: None   Collection Time: 01/21/21  5:43 PM  Result Value Ref Range   INFLUENZA A ANTIGEN, POC NEGATIVE NEGATIVE   INFLUENZA B ANTIGEN, POC NEGATIVE NEGATIVE    Assessment and Plan :   PDMP not reviewed this encounter.  1. Viral URI with cough   2. ESRD on dialysis [facility])    Will manage for viral illness such as viral URI, viral syndrome, viral rhinitis, COVID-19. Recommended supportive care. Offered scripts for symptomatic relief. Testing is pending. Counseled patient on potential for adverse effects with medications prescribed/recommended today, ER and return-to-clinic precautions discussed, patient verbalized understanding.      Jaynee Eagles, PA-C 01/21/21 1754

## 2021-01-21 NOTE — ED Triage Notes (Signed)
Pt presents with non productive cough, nasal drainage, sore throat, and chills X 2 days.

## 2021-01-22 LAB — SARS CORONAVIRUS 2 (TAT 6-24 HRS): SARS Coronavirus 2: NEGATIVE

## 2021-01-23 DIAGNOSIS — D509 Iron deficiency anemia, unspecified: Secondary | ICD-10-CM | POA: Diagnosis not present

## 2021-01-23 DIAGNOSIS — N2581 Secondary hyperparathyroidism of renal origin: Secondary | ICD-10-CM | POA: Diagnosis not present

## 2021-01-23 DIAGNOSIS — D631 Anemia in chronic kidney disease: Secondary | ICD-10-CM | POA: Diagnosis not present

## 2021-01-23 DIAGNOSIS — N186 End stage renal disease: Secondary | ICD-10-CM | POA: Diagnosis not present

## 2021-01-23 DIAGNOSIS — Z992 Dependence on renal dialysis: Secondary | ICD-10-CM | POA: Diagnosis not present

## 2021-01-25 DIAGNOSIS — D631 Anemia in chronic kidney disease: Secondary | ICD-10-CM | POA: Diagnosis not present

## 2021-01-25 DIAGNOSIS — N186 End stage renal disease: Secondary | ICD-10-CM | POA: Diagnosis not present

## 2021-01-25 DIAGNOSIS — D509 Iron deficiency anemia, unspecified: Secondary | ICD-10-CM | POA: Diagnosis not present

## 2021-01-25 DIAGNOSIS — N2581 Secondary hyperparathyroidism of renal origin: Secondary | ICD-10-CM | POA: Diagnosis not present

## 2021-01-25 DIAGNOSIS — Z992 Dependence on renal dialysis: Secondary | ICD-10-CM | POA: Diagnosis not present

## 2021-01-26 DIAGNOSIS — N186 End stage renal disease: Secondary | ICD-10-CM | POA: Diagnosis not present

## 2021-01-26 DIAGNOSIS — N039 Chronic nephritic syndrome with unspecified morphologic changes: Secondary | ICD-10-CM | POA: Diagnosis not present

## 2021-01-26 DIAGNOSIS — Z992 Dependence on renal dialysis: Secondary | ICD-10-CM | POA: Diagnosis not present

## 2021-01-27 DIAGNOSIS — D509 Iron deficiency anemia, unspecified: Secondary | ICD-10-CM | POA: Diagnosis not present

## 2021-01-27 DIAGNOSIS — Z992 Dependence on renal dialysis: Secondary | ICD-10-CM | POA: Diagnosis not present

## 2021-01-27 DIAGNOSIS — N186 End stage renal disease: Secondary | ICD-10-CM | POA: Diagnosis not present

## 2021-01-27 DIAGNOSIS — N2581 Secondary hyperparathyroidism of renal origin: Secondary | ICD-10-CM | POA: Diagnosis not present

## 2021-01-27 DIAGNOSIS — D631 Anemia in chronic kidney disease: Secondary | ICD-10-CM | POA: Diagnosis not present

## 2021-01-30 DIAGNOSIS — Z992 Dependence on renal dialysis: Secondary | ICD-10-CM | POA: Diagnosis not present

## 2021-01-30 DIAGNOSIS — N186 End stage renal disease: Secondary | ICD-10-CM | POA: Diagnosis not present

## 2021-01-30 DIAGNOSIS — D631 Anemia in chronic kidney disease: Secondary | ICD-10-CM | POA: Diagnosis not present

## 2021-01-30 DIAGNOSIS — D509 Iron deficiency anemia, unspecified: Secondary | ICD-10-CM | POA: Diagnosis not present

## 2021-01-30 DIAGNOSIS — N2581 Secondary hyperparathyroidism of renal origin: Secondary | ICD-10-CM | POA: Diagnosis not present

## 2021-02-01 DIAGNOSIS — N2581 Secondary hyperparathyroidism of renal origin: Secondary | ICD-10-CM | POA: Diagnosis not present

## 2021-02-01 DIAGNOSIS — D631 Anemia in chronic kidney disease: Secondary | ICD-10-CM | POA: Diagnosis not present

## 2021-02-01 DIAGNOSIS — D509 Iron deficiency anemia, unspecified: Secondary | ICD-10-CM | POA: Diagnosis not present

## 2021-02-01 DIAGNOSIS — Z992 Dependence on renal dialysis: Secondary | ICD-10-CM | POA: Diagnosis not present

## 2021-02-01 DIAGNOSIS — N186 End stage renal disease: Secondary | ICD-10-CM | POA: Diagnosis not present

## 2021-02-03 DIAGNOSIS — D631 Anemia in chronic kidney disease: Secondary | ICD-10-CM | POA: Diagnosis not present

## 2021-02-03 DIAGNOSIS — Z992 Dependence on renal dialysis: Secondary | ICD-10-CM | POA: Diagnosis not present

## 2021-02-03 DIAGNOSIS — D509 Iron deficiency anemia, unspecified: Secondary | ICD-10-CM | POA: Diagnosis not present

## 2021-02-03 DIAGNOSIS — N2581 Secondary hyperparathyroidism of renal origin: Secondary | ICD-10-CM | POA: Diagnosis not present

## 2021-02-03 DIAGNOSIS — N186 End stage renal disease: Secondary | ICD-10-CM | POA: Diagnosis not present

## 2021-02-06 DIAGNOSIS — N2581 Secondary hyperparathyroidism of renal origin: Secondary | ICD-10-CM | POA: Diagnosis not present

## 2021-02-06 DIAGNOSIS — N186 End stage renal disease: Secondary | ICD-10-CM | POA: Diagnosis not present

## 2021-02-06 DIAGNOSIS — D631 Anemia in chronic kidney disease: Secondary | ICD-10-CM | POA: Diagnosis not present

## 2021-02-06 DIAGNOSIS — D509 Iron deficiency anemia, unspecified: Secondary | ICD-10-CM | POA: Diagnosis not present

## 2021-02-06 DIAGNOSIS — Z992 Dependence on renal dialysis: Secondary | ICD-10-CM | POA: Diagnosis not present

## 2021-02-08 DIAGNOSIS — N2581 Secondary hyperparathyroidism of renal origin: Secondary | ICD-10-CM | POA: Diagnosis not present

## 2021-02-08 DIAGNOSIS — D631 Anemia in chronic kidney disease: Secondary | ICD-10-CM | POA: Diagnosis not present

## 2021-02-08 DIAGNOSIS — D509 Iron deficiency anemia, unspecified: Secondary | ICD-10-CM | POA: Diagnosis not present

## 2021-02-08 DIAGNOSIS — N186 End stage renal disease: Secondary | ICD-10-CM | POA: Diagnosis not present

## 2021-02-08 DIAGNOSIS — Z992 Dependence on renal dialysis: Secondary | ICD-10-CM | POA: Diagnosis not present

## 2021-02-10 DIAGNOSIS — D631 Anemia in chronic kidney disease: Secondary | ICD-10-CM | POA: Diagnosis not present

## 2021-02-10 DIAGNOSIS — N186 End stage renal disease: Secondary | ICD-10-CM | POA: Diagnosis not present

## 2021-02-10 DIAGNOSIS — D509 Iron deficiency anemia, unspecified: Secondary | ICD-10-CM | POA: Diagnosis not present

## 2021-02-10 DIAGNOSIS — Z992 Dependence on renal dialysis: Secondary | ICD-10-CM | POA: Diagnosis not present

## 2021-02-10 DIAGNOSIS — N2581 Secondary hyperparathyroidism of renal origin: Secondary | ICD-10-CM | POA: Diagnosis not present

## 2021-02-13 DIAGNOSIS — D631 Anemia in chronic kidney disease: Secondary | ICD-10-CM | POA: Diagnosis not present

## 2021-02-13 DIAGNOSIS — N2581 Secondary hyperparathyroidism of renal origin: Secondary | ICD-10-CM | POA: Diagnosis not present

## 2021-02-13 DIAGNOSIS — Z992 Dependence on renal dialysis: Secondary | ICD-10-CM | POA: Diagnosis not present

## 2021-02-13 DIAGNOSIS — D509 Iron deficiency anemia, unspecified: Secondary | ICD-10-CM | POA: Diagnosis not present

## 2021-02-13 DIAGNOSIS — N186 End stage renal disease: Secondary | ICD-10-CM | POA: Diagnosis not present

## 2021-02-15 DIAGNOSIS — N186 End stage renal disease: Secondary | ICD-10-CM | POA: Diagnosis not present

## 2021-02-15 DIAGNOSIS — N2581 Secondary hyperparathyroidism of renal origin: Secondary | ICD-10-CM | POA: Diagnosis not present

## 2021-02-15 DIAGNOSIS — Z992 Dependence on renal dialysis: Secondary | ICD-10-CM | POA: Diagnosis not present

## 2021-02-15 DIAGNOSIS — D509 Iron deficiency anemia, unspecified: Secondary | ICD-10-CM | POA: Diagnosis not present

## 2021-02-15 DIAGNOSIS — D631 Anemia in chronic kidney disease: Secondary | ICD-10-CM | POA: Diagnosis not present

## 2021-02-17 DIAGNOSIS — Z992 Dependence on renal dialysis: Secondary | ICD-10-CM | POA: Diagnosis not present

## 2021-02-17 DIAGNOSIS — N2581 Secondary hyperparathyroidism of renal origin: Secondary | ICD-10-CM | POA: Diagnosis not present

## 2021-02-17 DIAGNOSIS — N186 End stage renal disease: Secondary | ICD-10-CM | POA: Diagnosis not present

## 2021-02-17 DIAGNOSIS — D509 Iron deficiency anemia, unspecified: Secondary | ICD-10-CM | POA: Diagnosis not present

## 2021-02-17 DIAGNOSIS — D631 Anemia in chronic kidney disease: Secondary | ICD-10-CM | POA: Diagnosis not present

## 2021-02-20 DIAGNOSIS — N186 End stage renal disease: Secondary | ICD-10-CM | POA: Diagnosis not present

## 2021-02-20 DIAGNOSIS — N2581 Secondary hyperparathyroidism of renal origin: Secondary | ICD-10-CM | POA: Diagnosis not present

## 2021-02-20 DIAGNOSIS — D509 Iron deficiency anemia, unspecified: Secondary | ICD-10-CM | POA: Diagnosis not present

## 2021-02-20 DIAGNOSIS — D631 Anemia in chronic kidney disease: Secondary | ICD-10-CM | POA: Diagnosis not present

## 2021-02-20 DIAGNOSIS — Z992 Dependence on renal dialysis: Secondary | ICD-10-CM | POA: Diagnosis not present

## 2021-02-21 DIAGNOSIS — I361 Nonrheumatic tricuspid (valve) insufficiency: Secondary | ICD-10-CM | POA: Diagnosis not present

## 2021-02-22 DIAGNOSIS — D631 Anemia in chronic kidney disease: Secondary | ICD-10-CM | POA: Diagnosis not present

## 2021-02-22 DIAGNOSIS — D509 Iron deficiency anemia, unspecified: Secondary | ICD-10-CM | POA: Diagnosis not present

## 2021-02-22 DIAGNOSIS — N2581 Secondary hyperparathyroidism of renal origin: Secondary | ICD-10-CM | POA: Diagnosis not present

## 2021-02-22 DIAGNOSIS — N186 End stage renal disease: Secondary | ICD-10-CM | POA: Diagnosis not present

## 2021-02-22 DIAGNOSIS — Z992 Dependence on renal dialysis: Secondary | ICD-10-CM | POA: Diagnosis not present

## 2021-02-24 DIAGNOSIS — Z992 Dependence on renal dialysis: Secondary | ICD-10-CM | POA: Diagnosis not present

## 2021-02-24 DIAGNOSIS — D509 Iron deficiency anemia, unspecified: Secondary | ICD-10-CM | POA: Diagnosis not present

## 2021-02-24 DIAGNOSIS — N2581 Secondary hyperparathyroidism of renal origin: Secondary | ICD-10-CM | POA: Diagnosis not present

## 2021-02-24 DIAGNOSIS — D631 Anemia in chronic kidney disease: Secondary | ICD-10-CM | POA: Diagnosis not present

## 2021-02-24 DIAGNOSIS — N186 End stage renal disease: Secondary | ICD-10-CM | POA: Diagnosis not present

## 2021-02-26 DIAGNOSIS — I871 Compression of vein: Secondary | ICD-10-CM | POA: Diagnosis not present

## 2021-02-26 DIAGNOSIS — T82858A Stenosis of vascular prosthetic devices, implants and grafts, initial encounter: Secondary | ICD-10-CM | POA: Diagnosis not present

## 2021-02-26 DIAGNOSIS — N039 Chronic nephritic syndrome with unspecified morphologic changes: Secondary | ICD-10-CM | POA: Diagnosis not present

## 2021-02-26 DIAGNOSIS — N186 End stage renal disease: Secondary | ICD-10-CM | POA: Diagnosis not present

## 2021-02-26 DIAGNOSIS — Z992 Dependence on renal dialysis: Secondary | ICD-10-CM | POA: Diagnosis not present

## 2021-02-27 DIAGNOSIS — D509 Iron deficiency anemia, unspecified: Secondary | ICD-10-CM | POA: Diagnosis not present

## 2021-02-27 DIAGNOSIS — N186 End stage renal disease: Secondary | ICD-10-CM | POA: Diagnosis not present

## 2021-02-27 DIAGNOSIS — Z992 Dependence on renal dialysis: Secondary | ICD-10-CM | POA: Diagnosis not present

## 2021-02-27 DIAGNOSIS — D631 Anemia in chronic kidney disease: Secondary | ICD-10-CM | POA: Diagnosis not present

## 2021-02-27 DIAGNOSIS — N2581 Secondary hyperparathyroidism of renal origin: Secondary | ICD-10-CM | POA: Diagnosis not present

## 2021-03-01 DIAGNOSIS — T82858A Stenosis of vascular prosthetic devices, implants and grafts, initial encounter: Secondary | ICD-10-CM | POA: Diagnosis not present

## 2021-03-01 DIAGNOSIS — I871 Compression of vein: Secondary | ICD-10-CM | POA: Diagnosis not present

## 2021-03-01 DIAGNOSIS — Z992 Dependence on renal dialysis: Secondary | ICD-10-CM | POA: Diagnosis not present

## 2021-03-01 DIAGNOSIS — N186 End stage renal disease: Secondary | ICD-10-CM | POA: Diagnosis not present

## 2021-03-02 DIAGNOSIS — N186 End stage renal disease: Secondary | ICD-10-CM | POA: Diagnosis not present

## 2021-03-02 DIAGNOSIS — N2581 Secondary hyperparathyroidism of renal origin: Secondary | ICD-10-CM | POA: Diagnosis not present

## 2021-03-02 DIAGNOSIS — Z992 Dependence on renal dialysis: Secondary | ICD-10-CM | POA: Diagnosis not present

## 2021-03-02 DIAGNOSIS — D509 Iron deficiency anemia, unspecified: Secondary | ICD-10-CM | POA: Diagnosis not present

## 2021-03-02 DIAGNOSIS — D631 Anemia in chronic kidney disease: Secondary | ICD-10-CM | POA: Diagnosis not present

## 2021-03-03 DIAGNOSIS — D631 Anemia in chronic kidney disease: Secondary | ICD-10-CM | POA: Diagnosis not present

## 2021-03-03 DIAGNOSIS — D509 Iron deficiency anemia, unspecified: Secondary | ICD-10-CM | POA: Diagnosis not present

## 2021-03-03 DIAGNOSIS — N2581 Secondary hyperparathyroidism of renal origin: Secondary | ICD-10-CM | POA: Diagnosis not present

## 2021-03-03 DIAGNOSIS — Z992 Dependence on renal dialysis: Secondary | ICD-10-CM | POA: Diagnosis not present

## 2021-03-03 DIAGNOSIS — N186 End stage renal disease: Secondary | ICD-10-CM | POA: Diagnosis not present

## 2021-03-06 DIAGNOSIS — N2581 Secondary hyperparathyroidism of renal origin: Secondary | ICD-10-CM | POA: Diagnosis not present

## 2021-03-06 DIAGNOSIS — Z992 Dependence on renal dialysis: Secondary | ICD-10-CM | POA: Diagnosis not present

## 2021-03-06 DIAGNOSIS — N186 End stage renal disease: Secondary | ICD-10-CM | POA: Diagnosis not present

## 2021-03-06 DIAGNOSIS — D509 Iron deficiency anemia, unspecified: Secondary | ICD-10-CM | POA: Diagnosis not present

## 2021-03-06 DIAGNOSIS — D631 Anemia in chronic kidney disease: Secondary | ICD-10-CM | POA: Diagnosis not present

## 2021-03-08 DIAGNOSIS — Z6832 Body mass index (BMI) 32.0-32.9, adult: Secondary | ICD-10-CM | POA: Diagnosis not present

## 2021-03-08 DIAGNOSIS — N2581 Secondary hyperparathyroidism of renal origin: Secondary | ICD-10-CM | POA: Diagnosis not present

## 2021-03-08 DIAGNOSIS — N186 End stage renal disease: Secondary | ICD-10-CM | POA: Diagnosis not present

## 2021-03-08 DIAGNOSIS — D631 Anemia in chronic kidney disease: Secondary | ICD-10-CM | POA: Diagnosis not present

## 2021-03-08 DIAGNOSIS — Z992 Dependence on renal dialysis: Secondary | ICD-10-CM | POA: Diagnosis not present

## 2021-03-08 DIAGNOSIS — I361 Nonrheumatic tricuspid (valve) insufficiency: Secondary | ICD-10-CM | POA: Diagnosis not present

## 2021-03-08 DIAGNOSIS — D509 Iron deficiency anemia, unspecified: Secondary | ICD-10-CM | POA: Diagnosis not present

## 2021-03-08 DIAGNOSIS — I071 Rheumatic tricuspid insufficiency: Secondary | ICD-10-CM | POA: Diagnosis not present

## 2021-03-09 ENCOUNTER — Other Ambulatory Visit: Payer: Self-pay | Admitting: Obstetrics & Gynecology

## 2021-03-09 DIAGNOSIS — Z1231 Encounter for screening mammogram for malignant neoplasm of breast: Secondary | ICD-10-CM

## 2021-03-10 DIAGNOSIS — N2581 Secondary hyperparathyroidism of renal origin: Secondary | ICD-10-CM | POA: Diagnosis not present

## 2021-03-10 DIAGNOSIS — Z992 Dependence on renal dialysis: Secondary | ICD-10-CM | POA: Diagnosis not present

## 2021-03-10 DIAGNOSIS — D631 Anemia in chronic kidney disease: Secondary | ICD-10-CM | POA: Diagnosis not present

## 2021-03-10 DIAGNOSIS — N186 End stage renal disease: Secondary | ICD-10-CM | POA: Diagnosis not present

## 2021-03-10 DIAGNOSIS — D509 Iron deficiency anemia, unspecified: Secondary | ICD-10-CM | POA: Diagnosis not present

## 2021-03-13 DIAGNOSIS — Z992 Dependence on renal dialysis: Secondary | ICD-10-CM | POA: Diagnosis not present

## 2021-03-13 DIAGNOSIS — N186 End stage renal disease: Secondary | ICD-10-CM | POA: Diagnosis not present

## 2021-03-13 DIAGNOSIS — N2581 Secondary hyperparathyroidism of renal origin: Secondary | ICD-10-CM | POA: Diagnosis not present

## 2021-03-13 DIAGNOSIS — D509 Iron deficiency anemia, unspecified: Secondary | ICD-10-CM | POA: Diagnosis not present

## 2021-03-13 DIAGNOSIS — D631 Anemia in chronic kidney disease: Secondary | ICD-10-CM | POA: Diagnosis not present

## 2021-03-15 DIAGNOSIS — Z992 Dependence on renal dialysis: Secondary | ICD-10-CM | POA: Diagnosis not present

## 2021-03-15 DIAGNOSIS — N186 End stage renal disease: Secondary | ICD-10-CM | POA: Diagnosis not present

## 2021-03-15 DIAGNOSIS — N2581 Secondary hyperparathyroidism of renal origin: Secondary | ICD-10-CM | POA: Diagnosis not present

## 2021-03-15 DIAGNOSIS — D509 Iron deficiency anemia, unspecified: Secondary | ICD-10-CM | POA: Diagnosis not present

## 2021-03-15 DIAGNOSIS — D631 Anemia in chronic kidney disease: Secondary | ICD-10-CM | POA: Diagnosis not present

## 2021-03-16 ENCOUNTER — Ambulatory Visit (INDEPENDENT_AMBULATORY_CARE_PROVIDER_SITE_OTHER): Payer: Medicare Other | Admitting: Obstetrics & Gynecology

## 2021-03-16 ENCOUNTER — Other Ambulatory Visit: Payer: Self-pay

## 2021-03-16 ENCOUNTER — Encounter: Payer: Self-pay | Admitting: Obstetrics & Gynecology

## 2021-03-16 ENCOUNTER — Telehealth: Payer: Self-pay

## 2021-03-16 VITALS — BP 110/72

## 2021-03-16 DIAGNOSIS — N186 End stage renal disease: Secondary | ICD-10-CM | POA: Diagnosis not present

## 2021-03-16 DIAGNOSIS — M545 Low back pain, unspecified: Secondary | ICD-10-CM | POA: Diagnosis not present

## 2021-03-16 DIAGNOSIS — Z992 Dependence on renal dialysis: Secondary | ICD-10-CM | POA: Diagnosis not present

## 2021-03-16 DIAGNOSIS — M810 Age-related osteoporosis without current pathological fracture: Secondary | ICD-10-CM | POA: Diagnosis not present

## 2021-03-16 DIAGNOSIS — G8929 Other chronic pain: Secondary | ICD-10-CM

## 2021-03-16 MED ORDER — ALENDRONATE SODIUM 70 MG PO TABS: 70.0000 mg | ORAL_TABLET | ORAL | 4 refills | Status: DC

## 2021-03-16 NOTE — Telephone Encounter (Signed)
From: Princess Bruins, MD  Sent: 03/16/2021  10:09 AM EST  To: [facility]- Gynecology Surgery  Subject: Refer to Orthopedist                           Severe chronic lower back pain

## 2021-03-16 NOTE — Progress Notes (Signed)
° ° °  [name] Marks 18-Apr-1955 [id number]        66 y.o.  G2P2L2 Widowed.  4 grand-children.   RP:  Counseling and management of Osteoporosis   HPI: Postmenopause, well on no HRT.  No PMB.  BMI 33.09.  Needs to exercise more.  Uses a cane when outside the house for fall prevention.  No fall in the last year.  Kidney failure on Hemodialysis 3x a week.  C/O intermittently severe low back pain, not investigated so far.     OB History  Gravida Para Term Preterm AB Living  2 2       2   SAB IAB Ectopic Multiple Live Births               # Outcome Date GA Lbr Len/2nd Weight Sex Delivery Anes PTL Lv  2 Para           1 Para             Past medical history,surgical history, problem list, medications, allergies, family history and social history were all reviewed and documented in the EPIC chart.   Directed ROS with pertinent positives and negatives documented in the history of present illness/assessment and plan.  Exam:  Vitals:   03/16/21 0918  BP: 110/72   General appearance:  Normal  Bone Density:  Osteoporosis with T-Score at -2.6 at the Right Total Hip.  Other sites in Osteopenia range.   Assessment/Plan:  66 y.o. G2P2L2   1. Age-related osteoporosis without current pathological fracture Postmenopause, well on no HRT.  No PMB.  BMI 33.09.  Needs to exercise more.  Uses a cane when outside the house for fall prevention.  No fall in the last year.  Kidney failure on Hemodialysis 3x a week.  C/O intermittently severe low back pain, not investigated so far.Counseling on Osteoporosis and risks of bone fracture from minor falls.  Management reviewed with patient.  Decision to start on Fosamax.  Benefits/Risks and usage thoroughly reviewed.  Prescription sent to pharmacy.  Already taking Vit D supplement, Ca++ 1.2-1.5 g/d total per Nephro, regular wt bearing physical activities.  2. Stage 5 chronic kidney disease on chronic dialysis ([place]) On Dialysis 3 times a week.  3. Chronic  midline low back pain without sciatica Counseling done on lower back pain.  Refer to Orthopedist for investigation and management.  Other orders - alendronate (FOSAMAX) 70 MG tablet; Take 1 tablet (70 mg total) by mouth every 7 (seven) days. Take with a full glass of water on an empty stomach.   Princess Bruins MD, 9:27 AM 03/16/2021

## 2021-03-16 NOTE — Telephone Encounter (Signed)
Referral placed in Epic for orthopedist.

## 2021-03-17 DIAGNOSIS — Z992 Dependence on renal dialysis: Secondary | ICD-10-CM | POA: Diagnosis not present

## 2021-03-17 DIAGNOSIS — N2581 Secondary hyperparathyroidism of renal origin: Secondary | ICD-10-CM | POA: Diagnosis not present

## 2021-03-17 DIAGNOSIS — D631 Anemia in chronic kidney disease: Secondary | ICD-10-CM | POA: Diagnosis not present

## 2021-03-17 DIAGNOSIS — N186 End stage renal disease: Secondary | ICD-10-CM | POA: Diagnosis not present

## 2021-03-17 DIAGNOSIS — D509 Iron deficiency anemia, unspecified: Secondary | ICD-10-CM | POA: Diagnosis not present

## 2021-03-20 DIAGNOSIS — D509 Iron deficiency anemia, unspecified: Secondary | ICD-10-CM | POA: Diagnosis not present

## 2021-03-20 DIAGNOSIS — D631 Anemia in chronic kidney disease: Secondary | ICD-10-CM | POA: Diagnosis not present

## 2021-03-20 DIAGNOSIS — Z992 Dependence on renal dialysis: Secondary | ICD-10-CM | POA: Diagnosis not present

## 2021-03-20 DIAGNOSIS — N186 End stage renal disease: Secondary | ICD-10-CM | POA: Diagnosis not present

## 2021-03-20 DIAGNOSIS — N2581 Secondary hyperparathyroidism of renal origin: Secondary | ICD-10-CM | POA: Diagnosis not present

## 2021-03-21 NOTE — Telephone Encounter (Signed)
Appointment scheduled 04/04/21.

## 2021-03-22 DIAGNOSIS — Z992 Dependence on renal dialysis: Secondary | ICD-10-CM | POA: Diagnosis not present

## 2021-03-22 DIAGNOSIS — D631 Anemia in chronic kidney disease: Secondary | ICD-10-CM | POA: Diagnosis not present

## 2021-03-22 DIAGNOSIS — N2581 Secondary hyperparathyroidism of renal origin: Secondary | ICD-10-CM | POA: Diagnosis not present

## 2021-03-22 DIAGNOSIS — D509 Iron deficiency anemia, unspecified: Secondary | ICD-10-CM | POA: Diagnosis not present

## 2021-03-22 DIAGNOSIS — N186 End stage renal disease: Secondary | ICD-10-CM | POA: Diagnosis not present

## 2021-03-24 DIAGNOSIS — Z992 Dependence on renal dialysis: Secondary | ICD-10-CM | POA: Diagnosis not present

## 2021-03-24 DIAGNOSIS — N186 End stage renal disease: Secondary | ICD-10-CM | POA: Diagnosis not present

## 2021-03-24 DIAGNOSIS — D509 Iron deficiency anemia, unspecified: Secondary | ICD-10-CM | POA: Diagnosis not present

## 2021-03-24 DIAGNOSIS — N2581 Secondary hyperparathyroidism of renal origin: Secondary | ICD-10-CM | POA: Diagnosis not present

## 2021-03-24 DIAGNOSIS — D631 Anemia in chronic kidney disease: Secondary | ICD-10-CM | POA: Diagnosis not present

## 2021-03-27 ENCOUNTER — Other Ambulatory Visit: Payer: Self-pay | Admitting: Gastroenterology

## 2021-03-27 DIAGNOSIS — N2581 Secondary hyperparathyroidism of renal origin: Secondary | ICD-10-CM | POA: Diagnosis not present

## 2021-03-27 DIAGNOSIS — Z992 Dependence on renal dialysis: Secondary | ICD-10-CM | POA: Diagnosis not present

## 2021-03-27 DIAGNOSIS — D631 Anemia in chronic kidney disease: Secondary | ICD-10-CM | POA: Diagnosis not present

## 2021-03-27 DIAGNOSIS — N186 End stage renal disease: Secondary | ICD-10-CM | POA: Diagnosis not present

## 2021-03-27 DIAGNOSIS — D509 Iron deficiency anemia, unspecified: Secondary | ICD-10-CM | POA: Diagnosis not present

## 2021-03-28 ENCOUNTER — Ambulatory Visit
Admission: RE | Admit: 2021-03-28 | Discharge: 2021-03-28 | Disposition: A | Discharge status: Home / Self Care | Payer: Medicare Other | Source: Ambulatory Visit | Attending: Obstetrics & Gynecology | Admitting: Obstetrics & Gynecology

## 2021-03-28 DIAGNOSIS — Z1231 Encounter for screening mammogram for malignant neoplasm of breast: Secondary | ICD-10-CM | POA: Diagnosis not present

## 2021-03-29 DIAGNOSIS — N186 End stage renal disease: Secondary | ICD-10-CM | POA: Diagnosis not present

## 2021-03-29 DIAGNOSIS — N2581 Secondary hyperparathyroidism of renal origin: Secondary | ICD-10-CM | POA: Diagnosis not present

## 2021-03-29 DIAGNOSIS — N039 Chronic nephritic syndrome with unspecified morphologic changes: Secondary | ICD-10-CM | POA: Diagnosis not present

## 2021-03-29 DIAGNOSIS — D631 Anemia in chronic kidney disease: Secondary | ICD-10-CM | POA: Diagnosis not present

## 2021-03-29 DIAGNOSIS — Z992 Dependence on renal dialysis: Secondary | ICD-10-CM | POA: Diagnosis not present

## 2021-03-31 DIAGNOSIS — D631 Anemia in chronic kidney disease: Secondary | ICD-10-CM | POA: Diagnosis not present

## 2021-03-31 DIAGNOSIS — N186 End stage renal disease: Secondary | ICD-10-CM | POA: Diagnosis not present

## 2021-03-31 DIAGNOSIS — N2581 Secondary hyperparathyroidism of renal origin: Secondary | ICD-10-CM | POA: Diagnosis not present

## 2021-03-31 DIAGNOSIS — Z992 Dependence on renal dialysis: Secondary | ICD-10-CM | POA: Diagnosis not present

## 2021-04-03 DIAGNOSIS — N186 End stage renal disease: Secondary | ICD-10-CM | POA: Diagnosis not present

## 2021-04-03 DIAGNOSIS — N2581 Secondary hyperparathyroidism of renal origin: Secondary | ICD-10-CM | POA: Diagnosis not present

## 2021-04-03 DIAGNOSIS — Z992 Dependence on renal dialysis: Secondary | ICD-10-CM | POA: Diagnosis not present

## 2021-04-03 DIAGNOSIS — D631 Anemia in chronic kidney disease: Secondary | ICD-10-CM | POA: Diagnosis not present

## 2021-04-04 ENCOUNTER — Ambulatory Visit (INDEPENDENT_AMBULATORY_CARE_PROVIDER_SITE_OTHER): Payer: Medicare Other | Admitting: Physician Assistant

## 2021-04-04 ENCOUNTER — Encounter: Payer: Self-pay | Admitting: Physician Assistant

## 2021-04-04 ENCOUNTER — Other Ambulatory Visit: Payer: Self-pay

## 2021-04-04 ENCOUNTER — Ambulatory Visit (INDEPENDENT_AMBULATORY_CARE_PROVIDER_SITE_OTHER): Payer: Medicare Other

## 2021-04-04 DIAGNOSIS — G8929 Other chronic pain: Secondary | ICD-10-CM | POA: Diagnosis not present

## 2021-04-04 DIAGNOSIS — M545 Low back pain, unspecified: Secondary | ICD-10-CM | POA: Diagnosis not present

## 2021-04-04 NOTE — Progress Notes (Signed)
Office Visit Note   Patient: [name] Marks           Date of Birth: 1955/12/20           MRN: [id number] Visit Date: 04/04/2021              Requested by: Princess Bruins, [place],  [place] [address] PCP: Eppie Gibson, MD  Chief Complaint  Patient presents with   Lower Back - Pain      HPI: Patient is a pleasant 66 year old woman with a long history of lower back pain.  She denies any specific injury but she states the pains been going on for years.  Pain is in the central portion of her lower back and can radiate down to her legs.  She denies any loss of bowel control.  She has been on chronic kidney dialysis for 22 years and has not eligible for transplant because of a cardiac issue.  She has had not had any real change in her symptoms.  She denies any weakness.  She uses a cane when she is out and about in the community for balance.  She denies any paresthesias.  Assessment & Plan: Visit Diagnoses:  1. Chronic bilateral low back pain without sciatica     Plan: Patient with long chronic history of lower back pain.  Given her medical history I think she would do well first with trying a course of physical therapy since her symptoms are stable.  She did have an MRI in 2018 which showed severe spinal stenosis at L4-5 with accelerated degenerative disc disease.  She will follow-up if she does not get improvements  Follow-Up Instructions: No follow-ups on file.   Ortho Exam  Patient is alert, oriented, no adenopathy, well-dressed, normal affect, normal respiratory effort. Examination of her lower back she has no deformity she has some pain with forward bending over the lower paravertebral muscles.  She has equivalent strength 5 out of 5 deep tendon reflexes are intact she has good strength with dorsiflexion plantarflexion of her ankles resisted extension and flexion of her knees and resisted elevation flexion of her hips.  No pain with  internal/external rotation of her hips  Imaging: XR Lumbar Spine 2-3 Views  Result Date: 04/04/2021 Radiographs of her lumbar spine demonstrate some listhesis and degenerative changes especially L5-S1 and L4-5 with osteophyte formation at the endplates.  No acute osseous changes noted.  No images are attached to the encounter.  Labs: Lab Results  Component Value Date   HGBA1C 4.8 09/29/2016   HGBA1C 4.3 05/29/2016   HGBA1C <4.2 (L) 05/17/2016   ESRSEDRATE 47 (H) 02/17/2016   CRP 27.3 (H) 02/17/2016   REPTSTATUS 05/21/2016 FINAL 05/16/2016   GRAMSTAIN  02/25/2016    NO WBC SEEN RARE GRAM POSITIVE COCCI IN PAIRS GRAM STAIN REVIEWED-AGREE WITH RESULT L [facility]    CULT NO GROWTH 5 DAYS 05/16/2016   LABORGA STAPHYLOCOCCUS LUGDUNENSIS 02/25/2016     Lab Results  Component Value Date   ALBUMIN 3.7 02/18/2017   ALBUMIN 2.7 (L) 02/01/2017   ALBUMIN 3.0 (L) 01/31/2017    Lab Results  Component Value Date   MG 1.7 02/01/2017   MG 1.9 02/26/2016   MG 2.2 02/26/2016   No results found for: VD25OH  No results found for: PREALBUMIN CBC EXTENDED Latest Ref Rng & Units 10/03/2018 02/21/2018 02/18/2017  WBC 4.0 - 10.5 K/uL 9.1 10.1 6.1  RBC 3.87 - 5.11 MIL/uL 3.59(L)  2.86(L) 3.83(L)  HGB 12.0 - 15.0 g/dL 10.4(L) 9.2(L) 10.5(L)  HCT 36.0 - 46.0 % 33.0(L) 27.9(L) 33.5(L)  PLT 150 - 400 K/uL 260 231 314  NEUTROABS 1.7 - 7.7 K/uL - 7.3 -  LYMPHSABS 0.7 - 4.0 K/uL - 1.8 -     There is no height or weight on file to calculate BMI.  Orders:  Orders Placed This Encounter  Procedures   XR Lumbar Spine 2-3 Views   Ambulatory referral to Physical Therapy   No orders of the defined types were placed in this encounter.    Procedures: No procedures performed  Clinical Data: No additional findings.  ROS:  All other systems negative, except as noted in the HPI. Review of Systems  Objective: Vital Signs: There were no vitals taken for this visit.  Specialty Comments:  No  specialty comments available.  PMFS History: Patient Active Problem List   Diagnosis Date Noted   Tooth, broken 12/18/2018   Hypotension 10/03/2018   Orthostatic hypotension 03/14/2018   S/P cholecystectomy 02/14/2017   Osteoporosis 12/11/2016   Cholelithiasis 11/07/2016   Cerebral embolism with transient ischemic attack (TIA)    AVD (aortic valve disease) 07/12/2016   Pulmonary HTN ([place]) 07/12/2016   Carpal tunnel syndrome 07/05/2016   Disorder of both mastoids 05/29/2016   CVA (cerebral vascular accident) ([place]) 05/16/2016   History of aortic valve replacement with bioprosthetic valve 05/08/2016   Bacteremia due to coagulase-negative Staphylococcus    History of endocarditis    Anemia    Complex endometrial hyperplasia with atypia 11/08/2015   Dependence on renal dialysis ([place]) 12/15/2014   Obesity 10/28/2014   Right carotid bruit 01/08/2013   Generalized headaches 01/08/2013   Chronic female pelvic pain 08/08/2012   Gout, unspecified 12/06/2008   Esophageal reflux 12/06/2008   Backache 12/06/2008   FIBROIDS, UTERUS 11/24/2008   ESRD (end stage renal disease) on dialysis ([place]) 07/01/2006   Anemia in chronic kidney disease 12/18/2001   Past Medical History:  Diagnosis Date   Anemia    Aortic valve prosthesis present 02/25/2016   Arthritis    "knees" (01/03/2017)   AVD (aortic valve disease) 07/12/2016   Backache 12/06/2008   Bacteremia due to coagulase-negative Staphylococcus    Carpal tunnel syndrome    Cerebral embolism with transient ischemic attack (TIA)    Cholelithiases 01/28/2017   Chronic female pelvic pain 07/13/6158   Complication of anesthesia    01/01/17- '"a long time ago, difficulty breathimg, not sure if it was due to anesthesia or not."   CVA (cerebral vascular accident) ([place]) 05/16/2016   End stage renal disease on dialysis ([place])    "MWF; [facility]." (01/03/2017)   Endocarditis    Esophageal reflux 12/06/2008   GERD (gastroesophageal reflux disease)     Gout    History of blood transfusion 2017   "related to blood poison" (01/03/2017)   Increased endometrial stripe thickness 11/03/2015   Neck pain    Osteoporosis 09/2016   T score -2.6   Pain and swelling of right upper extremity 12/20/2015   Pulmonary HTN ([place]) 07/12/2016   Renal dialysis device, implant, or graft complication 73/71/0626   Renal insufficiency    S/P cholecystectomy 02/14/2017   Septic shock ([place])    Staphylococcus aureus bacteremia    TIA (transient ischemic attack)    "several" (01/03/2017)    Family History  Problem Relation Age of Onset   Diabetes Mother    Hypertension Mother    Heart disease Mother  Alcohol abuse Mother    Kidney disease Mother    Cancer Father        COLON   Alcohol abuse Father    Breast cancer Sister 82   Hypertension Sister    Kidney disease Sister    Hypertension Son    Kidney disease Son     Past Surgical History:  Procedure Laterality Date   AORTIC VALVE REPLACEMENT N/A 02/25/2016   Procedure: AORTIC VALVE REPLACEMENT (AVR) implanted with Magna Ease Aortic valve size 41mm;  Surgeon: Melrose Nakayama, MD;  Location: [facility];  Service: Open Heart Surgery;  Laterality: N/A;   AV FISTULA PLACEMENT Left 01/03/2017   Procedure: INSERTION OF ARTERIOVENOUS (AV) GORE-TEX GRAFT LEFT THIGH;  Surgeon: Serafina Mitchell, MD;  Location: [facility];  Service: Vascular;  Laterality: Left;   [place] Right 02/17/2016   Procedure: REMOVAL OF TWO ARTERIOVENOUS GORETEX GRAFTS ([place]);  Surgeon: Angelia Mould, MD;  Location: [facility];  Service: Vascular;  Laterality: Right;   BREAST BIOPSY Left 2013   stereo    BREAST BIOPSY Right 2011   stereo    CARDIAC VALVE REPLACEMENT     CHOLECYSTECTOMY N/A 01/30/2017   Procedure: LAPAROSCOPIC CHOLECYSTECTOMY;  Surgeon: Erroll Luna, MD;  Location: [facility];  Service: General;  Laterality: N/A;   COLONOSCOPY W/ POLYPECTOMY     DG AV DIALYSIS GRAFT DECLOT OR     DILATATION & CURETTAGE/HYSTEROSCOPY  WITH TRUECLEAR N/A 11/06/2012   Procedure: DILATATION & CURETTAGE/HYSTEROSCOPY WITH TRUECLEAR;  Surgeon: Terrance Mass, MD;  Location: [facility];  Service: Gynecology;  Laterality: N/A;  Truclear Resectoscopic Polypectomy    INSERTION OF DIALYSIS CATHETER N/A 02/19/2016   Procedure: INSERTION OF Left Internal Jugular DIALYSIS CATHETER;  Surgeon: Angelia Mould, MD;  Location: [facility];  Service: Vascular;  Laterality: N/A;   LOOP RECORDER INSERTION N/A 10/01/2016   Procedure: LOOP RECORDER INSERTION;  Surgeon: Constance Haw, MD;  Location: [facility];  Service: Cardiovascular;  Laterality: N/A;   PATCH ANGIOPLASTY Right 02/17/2016   Procedure: PATCH ANGIOPLASTY;  Surgeon: Angelia Mould, MD;  Location: [facility];  Service: Vascular;  Laterality: Right;   PERIPHERAL VASCULAR CATHETERIZATION N/A 09/14/2014   Procedure: A/V Shuntogram/Fistulagram;  Surgeon: Katha Cabal, MD;  Location: [facility];  Service: Cardiovascular;  Laterality: N/A;   PERIPHERAL VASCULAR CATHETERIZATION N/A 09/14/2014   Procedure: A/V Shunt Intervention;  Surgeon: Katha Cabal, MD;  Location: [facility];  Service: Cardiovascular;  Laterality: N/A;   PERIPHERAL VASCULAR CATHETERIZATION Right 12/09/2014   Procedure: A/V Shuntogram/Fistulagram;  Surgeon: Algernon Huxley, MD;  Location: [facility];  Service: Cardiovascular;  Laterality: Right;   PERIPHERAL VASCULAR CATHETERIZATION N/A 12/09/2014   Procedure: A/V Shunt Intervention;  Surgeon: Algernon Huxley, MD;  Location: [facility];  Service: Cardiovascular;  Laterality: N/A;   PERIPHERAL VASCULAR CATHETERIZATION Right 05/24/2015   Procedure: A/V Shuntogram;  Surgeon: Serafina Mitchell, MD;  Location: [facility];  Service: Cardiovascular;  Laterality: Right;   PERIPHERAL VASCULAR CATHETERIZATION Right 05/24/2015   Procedure: Peripheral Vascular Balloon Angioplasty;  Surgeon: Serafina Mitchell, MD;  Location: [facility];  Service: Cardiovascular;  Laterality: Right;  right arm shunt   PERIPHERAL VASCULAR CATHETERIZATION N/A 06/13/2015   Procedure: A/V Shuntogram/Fistulagram;  Surgeon: Algernon Huxley, MD;  Location: [facility];  Service: Cardiovascular;  Laterality: N/A;   PERIPHERAL VASCULAR CATHETERIZATION N/A 06/13/2015  Procedure: A/V Shunt Intervention;  Surgeon: Algernon Huxley, MD;  Location: [facility];  Service: Cardiovascular;  Laterality: N/A;   TEE WITHOUT CARDIOVERSION N/A 02/22/2016   Procedure: TRANSESOPHAGEAL ECHOCARDIOGRAM (TEE);  Surgeon: Pixie Casino, MD;  Location: [facility];  Service: Cardiovascular;  Laterality: N/A;   TEE WITHOUT CARDIOVERSION N/A 02/25/2016   Procedure: TRANSESOPHAGEAL ECHOCARDIOGRAM (TEE);  Surgeon: Melrose Nakayama, MD;  Location: [facility];  Service: Open Heart Surgery;  Laterality: N/A;   TEE WITHOUT CARDIOVERSION N/A 10/01/2016   Procedure: TRANSESOPHAGEAL ECHOCARDIOGRAM (TEE);  Surgeon: Pixie Casino, MD;  Location: [facility];  Service: Cardiovascular;  Laterality: N/A;   [place] History   Occupational History   Not on file  Tobacco Use   Smoking status: Never   Smokeless tobacco: Never  Vaping Use   Vaping Use: Never used  Substance and Sexual Activity   Alcohol use: No   Drug use: No   Sexual activity: Not Currently    Birth control/protection: Post-menopausal    Comment: 1st intercourse 66 yo-Fewer than 5 partners

## 2021-04-05 DIAGNOSIS — N2581 Secondary hyperparathyroidism of renal origin: Secondary | ICD-10-CM | POA: Diagnosis not present

## 2021-04-05 DIAGNOSIS — Z992 Dependence on renal dialysis: Secondary | ICD-10-CM | POA: Diagnosis not present

## 2021-04-05 DIAGNOSIS — N186 End stage renal disease: Secondary | ICD-10-CM | POA: Diagnosis not present

## 2021-04-05 DIAGNOSIS — D631 Anemia in chronic kidney disease: Secondary | ICD-10-CM | POA: Diagnosis not present

## 2021-04-07 DIAGNOSIS — N2581 Secondary hyperparathyroidism of renal origin: Secondary | ICD-10-CM | POA: Diagnosis not present

## 2021-04-07 DIAGNOSIS — D631 Anemia in chronic kidney disease: Secondary | ICD-10-CM | POA: Diagnosis not present

## 2021-04-07 DIAGNOSIS — N186 End stage renal disease: Secondary | ICD-10-CM | POA: Diagnosis not present

## 2021-04-07 DIAGNOSIS — Z992 Dependence on renal dialysis: Secondary | ICD-10-CM | POA: Diagnosis not present

## 2021-04-08 ENCOUNTER — Other Ambulatory Visit: Payer: Self-pay | Admitting: Nephrology

## 2021-04-08 DIAGNOSIS — R221 Localized swelling, mass and lump, neck: Secondary | ICD-10-CM

## 2021-04-10 DIAGNOSIS — Z992 Dependence on renal dialysis: Secondary | ICD-10-CM | POA: Diagnosis not present

## 2021-04-10 DIAGNOSIS — D631 Anemia in chronic kidney disease: Secondary | ICD-10-CM | POA: Diagnosis not present

## 2021-04-10 DIAGNOSIS — N186 End stage renal disease: Secondary | ICD-10-CM | POA: Diagnosis not present

## 2021-04-10 DIAGNOSIS — N2581 Secondary hyperparathyroidism of renal origin: Secondary | ICD-10-CM | POA: Diagnosis not present

## 2021-04-12 DIAGNOSIS — D631 Anemia in chronic kidney disease: Secondary | ICD-10-CM | POA: Diagnosis not present

## 2021-04-12 DIAGNOSIS — N2581 Secondary hyperparathyroidism of renal origin: Secondary | ICD-10-CM | POA: Diagnosis not present

## 2021-04-12 DIAGNOSIS — Z992 Dependence on renal dialysis: Secondary | ICD-10-CM | POA: Diagnosis not present

## 2021-04-12 DIAGNOSIS — N186 End stage renal disease: Secondary | ICD-10-CM | POA: Diagnosis not present

## 2021-04-13 ENCOUNTER — Other Ambulatory Visit: Payer: Self-pay

## 2021-04-13 ENCOUNTER — Ambulatory Visit (INDEPENDENT_AMBULATORY_CARE_PROVIDER_SITE_OTHER): Payer: Medicare Other | Admitting: Physical Therapy

## 2021-04-13 ENCOUNTER — Encounter: Payer: Self-pay | Admitting: Physical Therapy

## 2021-04-13 DIAGNOSIS — R2689 Other abnormalities of gait and mobility: Secondary | ICD-10-CM | POA: Diagnosis not present

## 2021-04-13 DIAGNOSIS — G8929 Other chronic pain: Secondary | ICD-10-CM | POA: Diagnosis not present

## 2021-04-13 DIAGNOSIS — R262 Difficulty in walking, not elsewhere classified: Secondary | ICD-10-CM

## 2021-04-13 DIAGNOSIS — M545 Low back pain, unspecified: Secondary | ICD-10-CM | POA: Diagnosis not present

## 2021-04-13 DIAGNOSIS — M6281 Muscle weakness (generalized): Secondary | ICD-10-CM | POA: Diagnosis not present

## 2021-04-13 NOTE — Therapy (Addendum)
OUTPATIENT PHYSICAL THERAPY THORACOLUMBAR EVALUATION   Patient Name: [name] Marks MRN: [id number] DOB:Nov 16, 1955, 66 y.o., female Today's Date: 04/13/2021   PT End of Session - 04/13/21 1159     Visit Number 1    Number of Visits 12    Date for PT Re-Evaluation 06/08/21    Authorization Type Medicare    Progress Note Due on Visit 10    PT Start Time 0930    PT Stop Time 1012    PT Time Calculation (min) 42 min    Activity Tolerance Patient tolerated treatment well;Patient limited by pain    Behavior During Therapy [facility] for tasks assessed/performed             Past Medical History:  Diagnosis Date   Anemia    Aortic valve prosthesis present 02/25/2016   Arthritis    "knees" (01/03/2017)   AVD (aortic valve disease) 07/12/2016   Backache 12/06/2008   Bacteremia due to coagulase-negative Staphylococcus    Carpal tunnel syndrome    Cerebral embolism with transient ischemic attack (TIA)    Cholelithiases 01/28/2017   Chronic female pelvic pain 07/04/3265   Complication of anesthesia    01/01/17- '"a long time ago, difficulty breathimg, not sure if it was due to anesthesia or not."   CVA (cerebral vascular accident) ([place]) 05/16/2016   End stage renal disease on dialysis ([place])    "MWF; [place]." (01/03/2017)   Endocarditis    Esophageal reflux 12/06/2008   GERD (gastroesophageal reflux disease)    Gout    History of blood transfusion 2017   "related to blood poison" (01/03/2017)   Increased endometrial stripe thickness 11/03/2015   Neck pain    Osteoporosis 09/2016   T score -2.6   Pain and swelling of right upper extremity 12/20/2015   Pulmonary HTN ([place]) 07/12/2016   Renal dialysis device, implant, or graft complication 12/45/8099   Renal insufficiency    S/P cholecystectomy 02/14/2017   Septic shock ([place])    Staphylococcus aureus bacteremia    TIA (transient ischemic attack)    "several" (01/03/2017)   Past Surgical History:  Procedure Laterality Date    AORTIC VALVE REPLACEMENT N/A 02/25/2016   Procedure: AORTIC VALVE REPLACEMENT (AVR) implanted with Magna Ease Aortic valve size 70mm;  Surgeon: Melrose Nakayama, MD;  Location: [place];  Service: Open Heart Surgery;  Laterality: N/A;   AV FISTULA PLACEMENT Left 01/03/2017   Procedure: INSERTION OF ARTERIOVENOUS (AV) GORE-TEX GRAFT LEFT THIGH;  Surgeon: Serafina Mitchell, MD;  Location: [place];  Service: Vascular;  Laterality: Left;   [place] Right 02/17/2016   Procedure: REMOVAL OF TWO ARTERIOVENOUS GORETEX GRAFTS ([place]);  Surgeon: Angelia Mould, MD;  Location: [place];  Service: Vascular;  Laterality: Right;   BREAST BIOPSY Left 2013   stereo    BREAST BIOPSY Right 2011   stereo    CARDIAC VALVE REPLACEMENT     CHOLECYSTECTOMY N/A 01/30/2017   Procedure: LAPAROSCOPIC CHOLECYSTECTOMY;  Surgeon: Erroll Luna, MD;  Location: [place];  Service: General;  Laterality: N/A;   COLONOSCOPY W/ POLYPECTOMY     DG AV DIALYSIS GRAFT DECLOT OR     DILATATION & CURETTAGE/HYSTEROSCOPY WITH TRUECLEAR N/A 11/06/2012   Procedure: DILATATION & CURETTAGE/HYSTEROSCOPY WITH TRUECLEAR;  Surgeon: Terrance Mass, MD;  Location: [facility];  Service: Gynecology;  Laterality: N/A;  Truclear Resectoscopic Polypectomy    INSERTION OF DIALYSIS CATHETER N/A 02/19/2016   Procedure: INSERTION OF Left Internal Jugular DIALYSIS CATHETER;  Surgeon: Angelia Mould, MD;  Location: [place];  Service: Vascular;  Laterality: N/A;   LOOP RECORDER INSERTION N/A 10/01/2016   Procedure: LOOP RECORDER INSERTION;  Surgeon: Constance Haw, MD;  Location: [place] [facility];  Service: Cardiovascular;  Laterality: N/A;   PATCH ANGIOPLASTY Right 02/17/2016   Procedure: PATCH ANGIOPLASTY;  Surgeon: Angelia Mould, MD;  Location: [place];  Service: Vascular;  Laterality: Right;   PERIPHERAL VASCULAR CATHETERIZATION N/A 09/14/2014   Procedure: A/V Shuntogram/Fistulagram;  Surgeon: Katha Cabal, MD;  Location: [place] [facility];  Service: Cardiovascular;  Laterality: N/A;   PERIPHERAL VASCULAR CATHETERIZATION N/A 09/14/2014   Procedure: A/V Shunt Intervention;  Surgeon: Katha Cabal, MD;  Location: [place] [facility];  Service: Cardiovascular;  Laterality: N/A;   PERIPHERAL VASCULAR CATHETERIZATION Right 12/09/2014   Procedure: A/V Shuntogram/Fistulagram;  Surgeon: Algernon Huxley, MD;  Location: [place] [facility];  Service: Cardiovascular;  Laterality: Right;   PERIPHERAL VASCULAR CATHETERIZATION N/A 12/09/2014   Procedure: A/V Shunt Intervention;  Surgeon: Algernon Huxley, MD;  Location: [place] [facility];  Service: Cardiovascular;  Laterality: N/A;   PERIPHERAL VASCULAR CATHETERIZATION Right 05/24/2015   Procedure: A/V Shuntogram;  Surgeon: Serafina Mitchell, MD;  Location: [place] [facility];  Service: Cardiovascular;  Laterality: Right;   PERIPHERAL VASCULAR CATHETERIZATION Right 05/24/2015   Procedure: Peripheral Vascular Balloon Angioplasty;  Surgeon: Serafina Mitchell, MD;  Location: [place] [facility];  Service: Cardiovascular;  Laterality: Right;  right arm shunt   PERIPHERAL VASCULAR CATHETERIZATION N/A 06/13/2015   Procedure: A/V Shuntogram/Fistulagram;  Surgeon: Algernon Huxley, MD;  Location: [place] [facility];  Service: Cardiovascular;  Laterality: N/A;   PERIPHERAL VASCULAR CATHETERIZATION N/A 06/13/2015   Procedure: A/V Shunt Intervention;  Surgeon: Algernon Huxley, MD;  Location: [place] [facility];  Service: Cardiovascular;  Laterality: N/A;   TEE WITHOUT CARDIOVERSION N/A 02/22/2016   Procedure: TRANSESOPHAGEAL ECHOCARDIOGRAM (TEE);  Surgeon: Pixie Casino, MD;  Location: [facility];  Service: Cardiovascular;  Laterality: N/A;   TEE WITHOUT CARDIOVERSION N/A 02/25/2016   Procedure: TRANSESOPHAGEAL ECHOCARDIOGRAM (TEE);  Surgeon: Melrose Nakayama, MD;  Location: [place];  Service: Open Heart Surgery;  Laterality: N/A;   TEE WITHOUT CARDIOVERSION N/A 10/01/2016   Procedure: TRANSESOPHAGEAL  ECHOCARDIOGRAM (TEE);  Surgeon: Pixie Casino, MD;  Location: [facility];  Service: Cardiovascular;  Laterality: N/A;   [place]   Patient Active Problem List   Diagnosis Date Noted   Tooth, broken 12/18/2018   Hypotension 10/03/2018   Orthostatic hypotension 03/14/2018   S/P cholecystectomy 02/14/2017   Osteoporosis 12/11/2016   Cholelithiasis 11/07/2016   Cerebral embolism with transient ischemic attack (TIA)    AVD (aortic valve disease) 07/12/2016   Pulmonary HTN ([place]) 07/12/2016   Carpal tunnel syndrome 07/05/2016   Disorder of both mastoids 05/29/2016   CVA (cerebral vascular accident) ([place]) 05/16/2016   History of aortic valve replacement with bioprosthetic valve 05/08/2016   Bacteremia due to coagulase-negative Staphylococcus    History of endocarditis    Anemia    Complex endometrial hyperplasia with atypia 11/08/2015   Dependence on renal dialysis ([place]) 12/15/2014   Obesity 10/28/2014   Right carotid bruit 01/08/2013   Generalized headaches 01/08/2013   Chronic female pelvic pain 08/08/2012   Gout, unspecified 12/06/2008   Esophageal reflux 12/06/2008   Backache 12/06/2008   FIBROIDS, UTERUS 11/24/2008   ESRD (end stage renal disease) on dialysis ([place]) 07/01/2006  Anemia in chronic kidney disease 12/18/2001    PCP: Eppie Gibson, MD  REFERRING PROVIDER: Persons, Bevely [place], [place]  REFERRING DIAG: [phone] (ICD-10-CM) - Chronic bilateral low back pain without sciatica  THERAPY DIAG:  Muscle weakness (generalized)  Chronic low back pain without sciatica, unspecified back pain laterality  Other abnormalities of gait and mobility  Difficulty in walking, not elsewhere classified  ONSET DATE: 2+ years ago  SUBJECTIVE:                                                                                                                                                                                           SUBJECTIVE STATEMENT: She states she  has been experiencing low back pain for many years, but was able to discern the pain was coming from her back after her gallbladder removal surgery. The pain tends to stay located in her central to low back, and there are no radicular sx down the legs.  PERTINENT HISTORY:  Aortic valve prosthesis present, Arthritis, Cerebral embolism with transient ischemic attack (TIA), CVA (cerebral vascular accident) ([place]), Neck pain, Osteoporosis, Pulmonary HTN ([place])  PAIN:  Are you having pain? Yes NPRS scale: 0/10 (at rest), 10/10 (with activity)  Pain location: Low back  Pain orientation: Bilateral  PAIN TYPE: aching, dull, and tight Pain description: constant with activity Aggravating factors: Walking, Lifting, Housework Relieving factors: Rest, (Heat/ Ice have not helped very much)  PRECAUTIONS: None  WEIGHT BEARING RESTRICTIONS No  FALLS:  Has patient fallen in last 6 months? No, Number of falls: N/A Pt does state having a fear of falling    PLOF: Independent with household mobility without device and Independent with community mobility with device [facility])  PATIENT GOALS: Decrease pain and increase overall mobility   OBJECTIVE:   DIAGNOSTIC FINDINGS:  XR "Radiographs of her lumbar spine demonstrate some listhesis and degenerative changes especially L5-S1 and L4-5 with osteophyte formation at the endplates" MRI from 2018 "New severe spinal stenosis at L4-5 due to accelerated degenerative disc disease"  PATIENT SURVEYS:  FOTO 44%   COGNITION:  Overall cognitive status: Within functional limits for tasks assessed      MUSCLE LENGTH: Hamstrings: Tight hamstrings noted with SLR bilat  Ambulation:  Unsteady gait, ambulates with SPC  Posture: slumped  PALPATION: Focal trigger points noted along bilat perispinal's and glutes Moderate hypomobility w/ concordant pain located throughout lumbar spine (L2-L5) PAM's  LUMBARAROM/PROM  AROM AROM  04/13/2021  Flexion 90% of Full ROM   Extension 10 deg.  Right lateral flexion 90% of Full ROM w/ concordant pain  Left lateral flexion 90% of Full ROM w/ concordant  pain  Right rotation 50% of Full ROM   Left rotation 75% of Full ROM    (Blank rows = not tested)  LE MMT:  MMT Right 04/13/2021 Left 04/13/2021  Hip flexion 4 4  Hip extension 3+ 3+  Hip abduction 3+ 3+  Hip adduction 4 4  Hip internal rotation 4- 4-  Hip external rotation 3+ 4-   (Blank rows = not tested)  LUMBAR SPECIAL TESTS:  Straight leg raise test: Negative for radicular sx (+ for Hamstring tightness)  FUNCTIONAL TESTS:  5 times sit to stand: 27.27s  with UE support from armrests (unable to stand without UE support from armrests) Balance Feet together standing eyes closed for 10 sec Tandem balance less than 3 seconds    TODAY'S TREATMENT  reviewed pt's HEP, see below.    PATIENT EDUCATION:  Education details: HEP Person educated: Patient Education method: Consulting civil engineer, Media planner, Verbal cues, and Handouts Education comprehension: verbalized understanding, verbal cues required, and needs further education   HOME EXERCISE PROGRAM: Access Code: [email or web address] URL: [email or web address] Date: 04/13/2021 Prepared by: Elsie Ra  Exercises Seated Flexion Stretch - 2 x daily - 6 x weekly - 1 sets - 10 reps - 5 sec hold Supine Lower Trunk Rotation - 2 x daily - 6 x weekly - 1 sets - 10 reps - 5 sec hold Hooklying Single Knee to Chest Stretch - 2 x daily - 6 x weekly - 1 sets - 2 reps - 20sec hold Supine Bridge - 2 x daily - 6 x weekly - 1-2 sets - 10 reps - 5 hold Mini Squat with Counter Support - 2 x daily - 6 x weekly - 1-2 sets - 10 reps Standing Hip Abduction with Counter Support - 2 x daily - 6 x weekly - 1-2 sets - 10 reps Standing Hip Extension with Counter Support - 2 x daily - 6 x weekly - 1-2 sets - 10 reps   ASSESSMENT:  CLINICAL IMPRESSION: Patient is a 66 y.o. Female who was seen today for physical therapy  evaluation and treatment for chronic low back pain. Bilat hip strength and lumbar ROM limitations with functional balance deficits were present. Moderate hypomobility of the lumbar spine was noted as a result of pt's severe spinal stenosis. Patient will benefit from skilled PT to address below impairments and improve overall function.  OBJECTIVE IMPAIRMENTS: decreased activity tolerance, difficulty walking, decreased balance, decreased endurance, decreased mobility, decreased ROM, decreased strength, impaired flexibility, impaired LE use, postural dysfunction, and pain.  ACTIVITY LIMITATIONS: bending, lifting, carry, locomotion, cleaning, community activity, driving  PERSONAL FACTORS: Aortic valve prosthesis present, Arthritis, Cerebral embolism with transient ischemic attack (TIA), CVA (cerebral vascular accident) ([place]), Neck pain, Osteoporosis, Pulmonary HTN ([place])   REHAB POTENTIAL: Good  CLINICAL DECISION MAKING: moderate  EVALUATION COMPLEXITY: moderate  GOALS: Short term PT Goals (target date for Short term goals are 4 weeks 05/11/21) Pt will be I and compliant with HEP. Baseline:  Goal status: New Pt will decrease pain by 25% overall Baseline: Goal status: New  Long term PT goals (target dates for all long term goals are 8 weeks 06/08/21) Pt will improve lumbar rotation and extension ROM to [facility] to improve functional mobility Baseline: Goal status: New Pt will improve  hip/knee strength to at least 4+ /5 MMT to improve functional strength Baseline: Goal status: New Pt will improve FOTO to at least 57% functional to show improved function Baseline: Goal status: New Pt will improve tandem balance test to at  least 10 seconds Baseline: Goal status: New She will improve 5TSTS test to less than 22 seconds to improved balance and LE endurance Baseline: Goal status: New  PLAN: PT FREQUENCY: 1-2 times per week   PT DURATION: 8 weeks  PLANNED INTERVENTIONS (unless  contraindicated): aquatic PT, cryotherapy, Electrical stimulation, Iontophoresis with 4 mg/ml dexamethasome, Moist heat, traction, Ultrasound, gait training, Therapeutic exercise, balance training, neuromuscular re-education, patient/family education, manual techniques, passive ROM, dry needling, vasopnuematic device, spinal manipulations, joint manipulations  PLAN FOR NEXT SESSION: Reassess pt's HEP, Assess pt's TUG     Saia Derossett Singer, Student-PT 04/13/2021, 2:30 PM

## 2021-04-13 NOTE — Addendum Note (Signed)
Addended by: Debbe Odea on: 04/13/2021 02:49 PM   Modules accepted: Orders

## 2021-04-14 DIAGNOSIS — D631 Anemia in chronic kidney disease: Secondary | ICD-10-CM | POA: Diagnosis not present

## 2021-04-14 DIAGNOSIS — N2581 Secondary hyperparathyroidism of renal origin: Secondary | ICD-10-CM | POA: Diagnosis not present

## 2021-04-14 DIAGNOSIS — Z992 Dependence on renal dialysis: Secondary | ICD-10-CM | POA: Diagnosis not present

## 2021-04-14 DIAGNOSIS — N186 End stage renal disease: Secondary | ICD-10-CM | POA: Diagnosis not present

## 2021-04-17 DIAGNOSIS — N2581 Secondary hyperparathyroidism of renal origin: Secondary | ICD-10-CM | POA: Diagnosis not present

## 2021-04-17 DIAGNOSIS — D631 Anemia in chronic kidney disease: Secondary | ICD-10-CM | POA: Diagnosis not present

## 2021-04-17 DIAGNOSIS — N186 End stage renal disease: Secondary | ICD-10-CM | POA: Diagnosis not present

## 2021-04-17 DIAGNOSIS — Z992 Dependence on renal dialysis: Secondary | ICD-10-CM | POA: Diagnosis not present

## 2021-04-18 ENCOUNTER — Ambulatory Visit
Admission: RE | Admit: 2021-04-18 | Discharge: 2021-04-18 | Disposition: A | Discharge status: Home / Self Care | Payer: Medicare Other | Source: Ambulatory Visit | Attending: Nephrology | Admitting: Nephrology

## 2021-04-18 DIAGNOSIS — E042 Nontoxic multinodular goiter: Secondary | ICD-10-CM | POA: Diagnosis not present

## 2021-04-18 DIAGNOSIS — R221 Localized swelling, mass and lump, neck: Secondary | ICD-10-CM

## 2021-04-19 DIAGNOSIS — N2581 Secondary hyperparathyroidism of renal origin: Secondary | ICD-10-CM | POA: Diagnosis not present

## 2021-04-19 DIAGNOSIS — N186 End stage renal disease: Secondary | ICD-10-CM | POA: Diagnosis not present

## 2021-04-19 DIAGNOSIS — Z992 Dependence on renal dialysis: Secondary | ICD-10-CM | POA: Diagnosis not present

## 2021-04-19 DIAGNOSIS — D631 Anemia in chronic kidney disease: Secondary | ICD-10-CM | POA: Diagnosis not present

## 2021-04-21 DIAGNOSIS — N186 End stage renal disease: Secondary | ICD-10-CM | POA: Diagnosis not present

## 2021-04-21 DIAGNOSIS — Z992 Dependence on renal dialysis: Secondary | ICD-10-CM | POA: Diagnosis not present

## 2021-04-21 DIAGNOSIS — N2581 Secondary hyperparathyroidism of renal origin: Secondary | ICD-10-CM | POA: Diagnosis not present

## 2021-04-21 DIAGNOSIS — D631 Anemia in chronic kidney disease: Secondary | ICD-10-CM | POA: Diagnosis not present

## 2021-04-24 DIAGNOSIS — Z992 Dependence on renal dialysis: Secondary | ICD-10-CM | POA: Diagnosis not present

## 2021-04-24 DIAGNOSIS — N2581 Secondary hyperparathyroidism of renal origin: Secondary | ICD-10-CM | POA: Diagnosis not present

## 2021-04-24 DIAGNOSIS — N186 End stage renal disease: Secondary | ICD-10-CM | POA: Diagnosis not present

## 2021-04-24 DIAGNOSIS — D631 Anemia in chronic kidney disease: Secondary | ICD-10-CM | POA: Diagnosis not present

## 2021-04-24 NOTE — Therapy (Signed)
OUTPATIENT PHYSICAL THERAPY TREATMENT NOTE   Patient Name: [name] Marks MRN: [id number] DOB:Jul 12, 1955, 66 y.o., female Today's Date: 04/25/2021  PCP: Eppie Gibson, MD REFERRING PROVIDER: Persons, Bevely Palmer, [place]   PT End of Session - 04/25/21 [phone]     Visit Number 2    Number of Visits 12    Date for PT Re-Evaluation 06/08/21    Authorization Type Medicare    Progress Note Due on Visit 10    PT Start Time 0930    PT Stop Time 1008    PT Time Calculation (min) 38 min    Activity Tolerance Patient tolerated treatment well;Patient limited by pain    Behavior During Therapy [facility] for tasks assessed/performed             Past Medical History:  Diagnosis Date   Anemia    Aortic valve prosthesis present 02/25/2016   Arthritis    "knees" (01/03/2017)   AVD (aortic valve disease) 07/12/2016   Backache 12/06/2008   Bacteremia due to coagulase-negative Staphylococcus    Carpal tunnel syndrome    Cerebral embolism with transient ischemic attack (TIA)    Cholelithiases 01/28/2017   Chronic female pelvic pain 4/82/5003   Complication of anesthesia    01/01/17- '"a long time ago, difficulty breathimg, not sure if it was due to anesthesia or not."   CVA (cerebral vascular accident) ([place]) 05/16/2016   End stage renal disease on dialysis ([place])    "MWF; [place]." (01/03/2017)   Endocarditis    Esophageal reflux 12/06/2008   GERD (gastroesophageal reflux disease)    Gout    History of blood transfusion 2017   "related to blood poison" (01/03/2017)   Increased endometrial stripe thickness 11/03/2015   Neck pain    Osteoporosis 09/2016   T score -2.6   Pain and swelling of right upper extremity 12/20/2015   Pulmonary HTN ([place]) 07/12/2016   Renal dialysis device, implant, or graft complication 70/48/8891   Renal insufficiency    S/P cholecystectomy 02/14/2017   Septic shock ([place])    Staphylococcus aureus bacteremia    TIA (transient ischemic attack)    "several" (01/03/2017)    Past Surgical History:  Procedure Laterality Date   AORTIC VALVE REPLACEMENT N/A 02/25/2016   Procedure: AORTIC VALVE REPLACEMENT (AVR) implanted with Magna Ease Aortic valve size 7mm;  Surgeon: Melrose Nakayama, MD;  Location: [place];  Service: Open Heart Surgery;  Laterality: N/A;   AV FISTULA PLACEMENT Left 01/03/2017   Procedure: INSERTION OF ARTERIOVENOUS (AV) GORE-TEX GRAFT LEFT THIGH;  Surgeon: Serafina Mitchell, MD;  Location: [place];  Service: Vascular;  Laterality: Left;   [place] Right 02/17/2016   Procedure: REMOVAL OF TWO ARTERIOVENOUS GORETEX GRAFTS ([place]);  Surgeon: Angelia Mould, MD;  Location: [place];  Service: Vascular;  Laterality: Right;   BREAST BIOPSY Left 2013   stereo    BREAST BIOPSY Right 2011   stereo    CARDIAC VALVE REPLACEMENT     CHOLECYSTECTOMY N/A 01/30/2017   Procedure: LAPAROSCOPIC CHOLECYSTECTOMY;  Surgeon: Erroll Luna, MD;  Location: [place];  Service: General;  Laterality: N/A;   COLONOSCOPY W/ POLYPECTOMY     DG AV DIALYSIS GRAFT DECLOT OR     DILATATION & CURETTAGE/HYSTEROSCOPY WITH TRUECLEAR N/A 11/06/2012   Procedure: DILATATION & CURETTAGE/HYSTEROSCOPY WITH TRUECLEAR;  Surgeon: Terrance Mass, MD;  Location: [facility];  Service: Gynecology;  Laterality: N/A;  Truclear Resectoscopic Polypectomy    INSERTION OF DIALYSIS CATHETER N/A  02/19/2016   Procedure: INSERTION OF Left Internal Jugular DIALYSIS CATHETER;  Surgeon: Angelia Mould, MD;  Location: [place];  Service: Vascular;  Laterality: N/A;   LOOP RECORDER INSERTION N/A 10/01/2016   Procedure: LOOP RECORDER INSERTION;  Surgeon: Constance Haw, MD;  Location: [place] [facility];  Service: Cardiovascular;  Laterality: N/A;   PATCH ANGIOPLASTY Right 02/17/2016   Procedure: PATCH ANGIOPLASTY;  Surgeon: Angelia Mould, MD;  Location: [place];  Service: Vascular;  Laterality: Right;   PERIPHERAL VASCULAR CATHETERIZATION N/A 09/14/2014   Procedure: A/V  Shuntogram/Fistulagram;  Surgeon: Katha Cabal, MD;  Location: [place] [facility];  Service: Cardiovascular;  Laterality: N/A;   PERIPHERAL VASCULAR CATHETERIZATION N/A 09/14/2014   Procedure: A/V Shunt Intervention;  Surgeon: Katha Cabal, MD;  Location: [place] [facility];  Service: Cardiovascular;  Laterality: N/A;   PERIPHERAL VASCULAR CATHETERIZATION Right 12/09/2014   Procedure: A/V Shuntogram/Fistulagram;  Surgeon: Algernon Huxley, MD;  Location: [place] [facility];  Service: Cardiovascular;  Laterality: Right;   PERIPHERAL VASCULAR CATHETERIZATION N/A 12/09/2014   Procedure: A/V Shunt Intervention;  Surgeon: Algernon Huxley, MD;  Location: [place] [facility];  Service: Cardiovascular;  Laterality: N/A;   PERIPHERAL VASCULAR CATHETERIZATION Right 05/24/2015   Procedure: A/V Shuntogram;  Surgeon: Serafina Mitchell, MD;  Location: [place] [facility];  Service: Cardiovascular;  Laterality: Right;   PERIPHERAL VASCULAR CATHETERIZATION Right 05/24/2015   Procedure: Peripheral Vascular Balloon Angioplasty;  Surgeon: Serafina Mitchell, MD;  Location: [place] [facility];  Service: Cardiovascular;  Laterality: Right;  right arm shunt   PERIPHERAL VASCULAR CATHETERIZATION N/A 06/13/2015   Procedure: A/V Shuntogram/Fistulagram;  Surgeon: Algernon Huxley, MD;  Location: [place] [facility];  Service: Cardiovascular;  Laterality: N/A;   PERIPHERAL VASCULAR CATHETERIZATION N/A 06/13/2015   Procedure: A/V Shunt Intervention;  Surgeon: Algernon Huxley, MD;  Location: [place] [facility];  Service: Cardiovascular;  Laterality: N/A;   TEE WITHOUT CARDIOVERSION N/A 02/22/2016   Procedure: TRANSESOPHAGEAL ECHOCARDIOGRAM (TEE);  Surgeon: Pixie Casino, MD;  Location: [facility];  Service: Cardiovascular;  Laterality: N/A;   TEE WITHOUT CARDIOVERSION N/A 02/25/2016   Procedure: TRANSESOPHAGEAL ECHOCARDIOGRAM (TEE);  Surgeon: Melrose Nakayama, MD;  Location: [place];  Service: Open Heart Surgery;  Laterality:  N/A;   TEE WITHOUT CARDIOVERSION N/A 10/01/2016   Procedure: TRANSESOPHAGEAL ECHOCARDIOGRAM (TEE);  Surgeon: Pixie Casino, MD;  Location: [facility] ENDOSCOPY;  Service: Cardiovascular;  Laterality: N/A;   [place]   Patient Active Problem List   Diagnosis Date Noted   Tooth, broken 12/18/2018   Hypotension 10/03/2018   Orthostatic hypotension 03/14/2018   S/P cholecystectomy 02/14/2017   Osteoporosis 12/11/2016   Cholelithiasis 11/07/2016   Cerebral embolism with transient ischemic attack (TIA)    AVD (aortic valve disease) 07/12/2016   Pulmonary HTN ([place]) 07/12/2016   Carpal tunnel syndrome 07/05/2016   Disorder of both mastoids 05/29/2016   CVA (cerebral vascular accident) ([place]) 05/16/2016   History of aortic valve replacement with bioprosthetic valve 05/08/2016   Bacteremia due to coagulase-negative Staphylococcus    History of endocarditis    Anemia    Complex endometrial hyperplasia with atypia 11/08/2015   Dependence on renal dialysis ([place]) 12/15/2014   Obesity 10/28/2014   Right carotid bruit 01/08/2013   Generalized headaches 01/08/2013   Chronic female pelvic pain 08/08/2012   Gout, unspecified 12/06/2008   Esophageal reflux 12/06/2008   Backache 12/06/2008   FIBROIDS, UTERUS 11/24/2008  ESRD (end stage renal disease) on dialysis ([place]) 07/01/2006   Anemia in chronic kidney disease 12/18/2001    REFERRING DIAG: M54.50,G89.29 (ICD-10-CM) - Chronic bilateral low back pain without sciatica    THERAPY DIAG:  Muscle weakness (generalized)  Chronic low back pain without sciatica, unspecified back pain laterality  Other abnormalities of gait and mobility  Difficulty in walking, not elsewhere classified  PERTINENT HISTORY: Aortic valve prosthesis present, Arthritis, Cerebral embolism with transient ischemic attack (TIA), CVA (cerebral vascular accident) ([place]), Neck pain, Osteoporosis, Pulmonary HTN ([place]), ESRD on dialysis M/W/F  PRECAUTIONS: fistula in  LLE  SUBJECTIVE: feels a little stiff this morning  PAIN:  Are you having pain? Yes NPRS scale: 5/10 Pain location: LLE Pain orientation: Right  PAIN TYPE: aching Pain description: intermittent  Aggravating factors: reports cramping (dialysis) Relieving factors: sleeping     OBJECTIVE:    DIAGNOSTIC FINDINGS:  XR "Radiographs of her lumbar spine demonstrate some listhesis and degenerative changes especially L5-S1 and L4-5 with osteophyte formation at the endplates" MRI from 2018 "New severe spinal stenosis at L4-5 due to accelerated degenerative disc disease"   PATIENT SURVEYS:  04/13/21 FOTO 44%     PALPATION: 04/13/21:  Focal trigger points noted along bilat perispinal's and glutes Moderate hypomobility w/ concordant pain located throughout lumbar spine (L2-L5) PAM's   LUMBARAROM/PROM   AROM AROM  04/13/2021  Flexion 90% of Full ROM  Extension 10 deg.  Right lateral flexion 90% of Full ROM w/ concordant pain  Left lateral flexion 90% of Full ROM w/ concordant pain  Right rotation 50% of Full ROM   Left rotation 75% of Full ROM    (Blank rows = not tested)   LE MMT:   MMT Right 04/13/2021 Left 04/13/2021  Hip flexion 4 4  Hip extension 3+ 3+  Hip abduction 3+ 3+  Hip adduction 4 4  Hip internal rotation 4- 4-  Hip external rotation 3+ 4-   (Blank rows = not tested)   LUMBAR SPECIAL TESTS:  04/13/21 Straight leg raise test: Negative for radicular sx (+ for Hamstring tightness)   FUNCTIONAL TESTS:  04/13/21 5 times sit to stand: 27.27s  with UE support from armrests (unable to stand without UE support from armrests) Balance Feet together standing eyes closed for 10 sec Tandem balance less than 3 seconds       TODAY'S TREATMENT  04/25/21 Therex:  Aerobic: NuStep L6 x 8 min  Sitting:   Flexion stretch 10 x 5 sec hold  Supine:   LTR 10 x 5 sec hold bil   SKTC 2x20 sec hold bil   Bridge 10 x 5 sec hold     Standing:   Mini squats x 10 reps    Hip  abdct x 10 reps bil   Hip ext x 10 reps bil   Calf raises x 20 reps    04/13/21 reviewed pt's HEP, see below.      PATIENT EDUCATION:  Education details: HEP Person educated: Patient Education method: Consulting civil engineer, Media planner, Verbal cues, and Handouts Education comprehension: verbalized understanding, verbal cues required, and needs further education     HOME EXERCISE PROGRAM: Access Code: [email or web address] URL: [email or web address] Date: 04/13/2021 Prepared by: Elsie Ra   Exercises Seated Flexion Stretch - 2 x daily - 6 x weekly - 1 sets - 10 reps - 5 sec hold Supine Lower Trunk Rotation - 2 x daily - 6 x weekly - 1 sets - 10 reps - 5 sec hold Hooklying Single  Knee to Chest Stretch - 2 x daily - 6 x weekly - 1 sets - 2 reps - 20sec hold Supine Bridge - 2 x daily - 6 x weekly - 1-2 sets - 10 reps - 5 hold Mini Squat with Counter Support - 2 x daily - 6 x weekly - 1-2 sets - 10 reps Standing Hip Abduction with Counter Support - 2 x daily - 6 x weekly - 1-2 sets - 10 reps Standing Hip Extension with Counter Support - 2 x daily - 6 x weekly - 1-2 sets - 10 reps     ASSESSMENT:   CLINICAL IMPRESSION: Session today largely focused on review of HEP with mod cues needed throughout.  Overall she tolerated it well without increased pain and reported decreased stiffness following session.  Will continue to benefit from PT to maximize function.    OBJECTIVE IMPAIRMENTS: decreased activity tolerance, difficulty walking, decreased balance, decreased endurance, decreased mobility, decreased ROM, decreased strength, impaired flexibility, impaired LE use, postural dysfunction, and pain.   ACTIVITY LIMITATIONS: bending, lifting, carry, locomotion, cleaning, community activity, driving   PERSONAL FACTORS: Aortic valve prosthesis present, Arthritis, Cerebral embolism with transient ischemic attack (TIA), CVA (cerebral vascular accident) ([place]), Neck pain, Osteoporosis, Pulmonary HTN  ([place])     REHAB POTENTIAL: Good   CLINICAL DECISION MAKING: moderate   EVALUATION COMPLEXITY: moderate   GOALS: Short term PT Goals (target date for Short term goals are 4 weeks 05/11/21) Pt will be I and compliant with HEP. Baseline:  Goal status: New Pt will decrease pain by 25% overall Baseline: Goal status: New   Long term PT goals (target dates for all long term goals are 8 weeks 06/08/21) Pt will improve lumbar rotation and extension ROM to [facility] to improve functional mobility Baseline: Goal status: New Pt will improve  hip/knee strength to at least 4+ /5 MMT to improve functional strength Baseline: Goal status: New Pt will improve FOTO to at least 57% functional to show improved function Baseline: Goal status: New Pt will improve tandem balance test to at least 10 seconds Baseline: Goal status: New She will improve 5TSTS test to less than 22 seconds to improved balance and LE endurance Baseline: Goal status: New   PLAN: PT FREQUENCY: 1-2 times per week    PT DURATION: 8 weeks   PLANNED INTERVENTIONS (unless contraindicated): aquatic PT, cryotherapy, Electrical stimulation, Iontophoresis with 4 mg/ml dexamethasome, Moist heat, traction, Ultrasound, gait training, Therapeutic exercise, balance training, neuromuscular re-education, patient/family education, manual techniques, passive ROM, dry needling, vasopnuematic device, spinal manipulations, joint manipulations   PLAN FOR NEXT SESSION: review HEP PRN, perform TUG, continue hip/core strengthening and endurance    Laureen Abrahams, PT, DPT 04/25/21 10:09 AM

## 2021-04-25 ENCOUNTER — Encounter: Payer: Self-pay | Admitting: Physical Therapy

## 2021-04-25 ENCOUNTER — Ambulatory Visit (INDEPENDENT_AMBULATORY_CARE_PROVIDER_SITE_OTHER): Payer: Medicare Other | Admitting: Physical Therapy

## 2021-04-25 ENCOUNTER — Other Ambulatory Visit: Payer: Self-pay

## 2021-04-25 DIAGNOSIS — R2689 Other abnormalities of gait and mobility: Secondary | ICD-10-CM | POA: Diagnosis not present

## 2021-04-25 DIAGNOSIS — M6281 Muscle weakness (generalized): Secondary | ICD-10-CM | POA: Diagnosis not present

## 2021-04-25 DIAGNOSIS — R262 Difficulty in walking, not elsewhere classified: Secondary | ICD-10-CM | POA: Diagnosis not present

## 2021-04-25 DIAGNOSIS — M545 Low back pain, unspecified: Secondary | ICD-10-CM

## 2021-04-25 DIAGNOSIS — G8929 Other chronic pain: Secondary | ICD-10-CM

## 2021-04-26 DIAGNOSIS — D509 Iron deficiency anemia, unspecified: Secondary | ICD-10-CM | POA: Diagnosis not present

## 2021-04-26 DIAGNOSIS — N186 End stage renal disease: Secondary | ICD-10-CM | POA: Diagnosis not present

## 2021-04-26 DIAGNOSIS — Z992 Dependence on renal dialysis: Secondary | ICD-10-CM | POA: Diagnosis not present

## 2021-04-26 DIAGNOSIS — N2581 Secondary hyperparathyroidism of renal origin: Secondary | ICD-10-CM | POA: Diagnosis not present

## 2021-04-26 DIAGNOSIS — N039 Chronic nephritic syndrome with unspecified morphologic changes: Secondary | ICD-10-CM | POA: Diagnosis not present

## 2021-04-26 DIAGNOSIS — D631 Anemia in chronic kidney disease: Secondary | ICD-10-CM | POA: Diagnosis not present

## 2021-04-27 ENCOUNTER — Encounter: Payer: Self-pay | Admitting: Physical Therapy

## 2021-04-27 ENCOUNTER — Ambulatory Visit (INDEPENDENT_AMBULATORY_CARE_PROVIDER_SITE_OTHER): Payer: Medicare Other | Admitting: Physical Therapy

## 2021-04-27 ENCOUNTER — Other Ambulatory Visit: Payer: Self-pay

## 2021-04-27 DIAGNOSIS — M545 Low back pain, unspecified: Secondary | ICD-10-CM | POA: Diagnosis not present

## 2021-04-27 DIAGNOSIS — M6281 Muscle weakness (generalized): Secondary | ICD-10-CM | POA: Diagnosis not present

## 2021-04-27 DIAGNOSIS — G8929 Other chronic pain: Secondary | ICD-10-CM

## 2021-04-27 DIAGNOSIS — R2689 Other abnormalities of gait and mobility: Secondary | ICD-10-CM

## 2021-04-27 DIAGNOSIS — R262 Difficulty in walking, not elsewhere classified: Secondary | ICD-10-CM

## 2021-04-27 NOTE — Therapy (Signed)
OUTPATIENT PHYSICAL THERAPY TREATMENT NOTE   Patient Name: [name] Marks MRN: [id number] DOB:Mar 05, 1955, 66 y.o., female Today's Date: 04/27/2021  PCP: Eppie Gibson, MD REFERRING PROVIDER: Eppie Gibson, MD   PT End of Session - 04/27/21 1340     Visit Number 3    Number of Visits 12    Date for PT Re-Evaluation 06/08/21    Authorization Type Medicare    Progress Note Due on Visit 10    PT Start Time 1145    PT Stop Time 1230    PT Time Calculation (min) 45 min    Activity Tolerance Patient tolerated treatment well;Patient limited by pain    Behavior During Therapy [facility] for tasks assessed/performed              Past Medical History:  Diagnosis Date   Anemia    Aortic valve prosthesis present 02/25/2016   Arthritis    "knees" (01/03/2017)   AVD (aortic valve disease) 07/12/2016   Backache 12/06/2008   Bacteremia due to coagulase-negative Staphylococcus    Carpal tunnel syndrome    Cerebral embolism with transient ischemic attack (TIA)    Cholelithiases 01/28/2017   Chronic female pelvic pain 3/78/5885   Complication of anesthesia    01/01/17- '"a long time ago, difficulty breathimg, not sure if it was due to anesthesia or not."   CVA (cerebral vascular accident) ([place]) 05/16/2016   End stage renal disease on dialysis ([place])    "MWF; [place]." (01/03/2017)   Endocarditis    Esophageal reflux 12/06/2008   GERD (gastroesophageal reflux disease)    Gout    History of blood transfusion 2017   "related to blood poison" (01/03/2017)   Increased endometrial stripe thickness 11/03/2015   Neck pain    Osteoporosis 09/2016   T score -2.6   Pain and swelling of right upper extremity 12/20/2015   Pulmonary HTN ([place]) 07/12/2016   Renal dialysis device, implant, or graft complication 02/77/4128   Renal insufficiency    S/P cholecystectomy 02/14/2017   Septic shock ([place])    Staphylococcus aureus bacteremia    TIA (transient ischemic attack)    "several" (01/03/2017)    Past Surgical History:  Procedure Laterality Date   AORTIC VALVE REPLACEMENT N/A 02/25/2016   Procedure: AORTIC VALVE REPLACEMENT (AVR) implanted with Magna Ease Aortic valve size 65mm;  Surgeon: Melrose Nakayama, MD;  Location: [place];  Service: Open Heart Surgery;  Laterality: N/A;   AV FISTULA PLACEMENT Left 01/03/2017   Procedure: INSERTION OF ARTERIOVENOUS (AV) GORE-TEX GRAFT LEFT THIGH;  Surgeon: Serafina Mitchell, MD;  Location: [place];  Service: Vascular;  Laterality: Left;   [place] Right 02/17/2016   Procedure: REMOVAL OF TWO ARTERIOVENOUS GORETEX GRAFTS ([place]);  Surgeon: Angelia Mould, MD;  Location: [place];  Service: Vascular;  Laterality: Right;   BREAST BIOPSY Left 2013   stereo    BREAST BIOPSY Right 2011   stereo    CARDIAC VALVE REPLACEMENT     CHOLECYSTECTOMY N/A 01/30/2017   Procedure: LAPAROSCOPIC CHOLECYSTECTOMY;  Surgeon: Erroll Luna, MD;  Location: [place];  Service: General;  Laterality: N/A;   COLONOSCOPY W/ POLYPECTOMY     DG AV DIALYSIS GRAFT DECLOT OR     DILATATION & CURETTAGE/HYSTEROSCOPY WITH TRUECLEAR N/A 11/06/2012   Procedure: DILATATION & CURETTAGE/HYSTEROSCOPY WITH TRUECLEAR;  Surgeon: Terrance Mass, MD;  Location: [facility];  Service: Gynecology;  Laterality: N/A;  Truclear Resectoscopic Polypectomy    INSERTION OF DIALYSIS CATHETER  N/A 02/19/2016   Procedure: INSERTION OF Left Internal Jugular DIALYSIS CATHETER;  Surgeon: Angelia Mould, MD;  Location: [place];  Service: Vascular;  Laterality: N/A;   LOOP RECORDER INSERTION N/A 10/01/2016   Procedure: LOOP RECORDER INSERTION;  Surgeon: Constance Haw, MD;  Location: [place] [facility];  Service: Cardiovascular;  Laterality: N/A;   PATCH ANGIOPLASTY Right 02/17/2016   Procedure: PATCH ANGIOPLASTY;  Surgeon: Angelia Mould, MD;  Location: [place];  Service: Vascular;  Laterality: Right;   PERIPHERAL VASCULAR CATHETERIZATION N/A 09/14/2014   Procedure: A/V  Shuntogram/Fistulagram;  Surgeon: Katha Cabal, MD;  Location: [place] [facility];  Service: Cardiovascular;  Laterality: N/A;   PERIPHERAL VASCULAR CATHETERIZATION N/A 09/14/2014   Procedure: A/V Shunt Intervention;  Surgeon: Katha Cabal, MD;  Location: [place] [facility];  Service: Cardiovascular;  Laterality: N/A;   PERIPHERAL VASCULAR CATHETERIZATION Right 12/09/2014   Procedure: A/V Shuntogram/Fistulagram;  Surgeon: Algernon Huxley, MD;  Location: [place] [facility];  Service: Cardiovascular;  Laterality: Right;   PERIPHERAL VASCULAR CATHETERIZATION N/A 12/09/2014   Procedure: A/V Shunt Intervention;  Surgeon: Algernon Huxley, MD;  Location: [place] [facility];  Service: Cardiovascular;  Laterality: N/A;   PERIPHERAL VASCULAR CATHETERIZATION Right 05/24/2015   Procedure: A/V Shuntogram;  Surgeon: Serafina Mitchell, MD;  Location: [place] [facility];  Service: Cardiovascular;  Laterality: Right;   PERIPHERAL VASCULAR CATHETERIZATION Right 05/24/2015   Procedure: Peripheral Vascular Balloon Angioplasty;  Surgeon: Serafina Mitchell, MD;  Location: [place] [facility];  Service: Cardiovascular;  Laterality: Right;  right arm shunt   PERIPHERAL VASCULAR CATHETERIZATION N/A 06/13/2015   Procedure: A/V Shuntogram/Fistulagram;  Surgeon: Algernon Huxley, MD;  Location: [place] [facility];  Service: Cardiovascular;  Laterality: N/A;   PERIPHERAL VASCULAR CATHETERIZATION N/A 06/13/2015   Procedure: A/V Shunt Intervention;  Surgeon: Algernon Huxley, MD;  Location: [place] [facility];  Service: Cardiovascular;  Laterality: N/A;   TEE WITHOUT CARDIOVERSION N/A 02/22/2016   Procedure: TRANSESOPHAGEAL ECHOCARDIOGRAM (TEE);  Surgeon: Pixie Casino, MD;  Location: [facility];  Service: Cardiovascular;  Laterality: N/A;   TEE WITHOUT CARDIOVERSION N/A 02/25/2016   Procedure: TRANSESOPHAGEAL ECHOCARDIOGRAM (TEE);  Surgeon: Melrose Nakayama, MD;  Location: [place];  Service: Open Heart Surgery;  Laterality:  N/A;   TEE WITHOUT CARDIOVERSION N/A 10/01/2016   Procedure: TRANSESOPHAGEAL ECHOCARDIOGRAM (TEE);  Surgeon: Pixie Casino, MD;  Location[facility];  Service: Cardiovascular;  Laterality: N/A;   [place]   Patient Active Problem List   Diagnosis Date Noted   Tooth, broken 12/18/2018   Hypotension 10/03/2018   Orthostatic hypotension 03/14/2018   S/P cholecystectomy 02/14/2017   Osteoporosis 12/11/2016   Cholelithiasis 11/07/2016   Cerebral embolism with transient ischemic attack (TIA)    AVD (aortic valve disease) 07/12/2016   Pulmonary HTN ([place]) 07/12/2016   Carpal tunnel syndrome 07/05/2016   Disorder of both mastoids 05/29/2016   CVA (cerebral vascular accident) ([place]) 05/16/2016   History of aortic valve replacement with bioprosthetic valve 05/08/2016   Bacteremia due to coagulase-negative Staphylococcus    History of endocarditis    Anemia    Complex endometrial hyperplasia with atypia 11/08/2015   Dependence on renal dialysis ([place]) 12/15/2014   Obesity 10/28/2014   Right carotid bruit 01/08/2013   Generalized headaches 01/08/2013   Chronic female pelvic pain 08/08/2012   Gout, unspecified 12/06/2008   Esophageal reflux 12/06/2008   Backache 12/06/2008   FIBROIDS, UTERUS 11/24/2008  ESRD (end stage renal disease) on dialysis ([place]) 07/01/2006   Anemia in chronic kidney disease 12/18/2001    REFERRING DIAG: M54.50,G89.29 (ICD-10-CM) - Chronic bilateral low back pain without sciatica    THERAPY DIAG:  Muscle weakness (generalized)  Chronic low back pain without sciatica, unspecified back pain laterality  Other abnormalities of gait and mobility  Difficulty in walking, not elsewhere classified  PERTINENT HISTORY: Aortic valve prosthesis present, Arthritis, Cerebral embolism with transient ischemic attack (TIA), CVA (cerebral vascular accident) ([place]), Neck pain, Osteoporosis, Pulmonary HTN ([place]), ESRD on dialysis M/W/F  PRECAUTIONS: fistula in  LLE  SUBJECTIVE: Pt reports the LBP feels okay today. She states the back pain has been mild all week.   PAIN:  Are you having pain? Yes NPRS scale: mild Pain location: LLE Pain orientation: Right  PAIN TYPE: aching Pain description: intermittent  Aggravating factors: reports cramping (dialysis) Relieving factors: sleeping     OBJECTIVE:    DIAGNOSTIC FINDINGS:  XR "Radiographs of her lumbar spine demonstrate some listhesis and degenerative changes especially L5-S1 and L4-5 with osteophyte formation at the endplates" MRI from 2018 "New severe spinal stenosis at L4-5 due to accelerated degenerative disc disease"   PATIENT SURVEYS:  04/13/21 FOTO 44%     PALPATION: 04/13/21:  Focal trigger points noted along bilat perispinal's and glutes Moderate hypomobility w/ concordant pain located throughout lumbar spine (L2-L5) PAM's   LUMBARAROM/PROM   AROM AROM  04/13/2021  Flexion 90% of Full ROM  Extension 10 deg.  Right lateral flexion 90% of Full ROM w/ concordant pain  Left lateral flexion 90% of Full ROM w/ concordant pain  Right rotation 50% of Full ROM   Left rotation 75% of Full ROM    (Blank rows = not tested)   LE MMT:   MMT Right 04/13/2021 Left 04/13/2021  Hip flexion 4 4  Hip extension 3+ 3+  Hip abduction 3+ 3+  Hip adduction 4 4  Hip internal rotation 4- 4-  Hip external rotation 3+ 4-   (Blank rows = not tested)   LUMBAR SPECIAL TESTS:  04/13/21 Straight leg raise test: Negative for radicular sx (+ for Hamstring tightness)   FUNCTIONAL TESTS:  04/13/21 04/27/2021 TUG: 12.29sec w/o AD 5 times sit to stand: 27.27s  with UE support from armrests (unable to stand without UE support from armrests) Balance Feet together standing eyes closed for 10 sec Tandem balance less than 3 seconds       TODAY'S TREATMENT   04/27/2021: Therapeutic Exercise:  Aerobic: Nustep Lvl 5 x 8 minutes Supine: Posterior pelvic tilt x 10 (attempted bridges but pt was too  fatigued for this exercise today); Lower trunk rotations x 10 bilat Prone:  Seated:Green pball rollouts into lumbar flexion (middle, rt, lft x 10 each direction)   Standing: At counter top- Hip abd and ext. X 10 bilat; mini squats x 10; marches x 10 bilat Neuromuscular Re-education: Manual Therapy: Therapeutic Activity: Navigation through cones x 3 (down and back); lateral stepping over wand x 8 bilat  Self Care: Trigger Point Dry Needling:  Modalities:    04/25/21 Therex:  Aerobic: NuStep L6 x 8 min  Sitting:   Flexion stretch 10 x 5 sec hold  Supine:   LTR 10 x 5 sec hold bil   SKTC 2x20 sec hold bil   Bridge 10 x 5 sec hold     Standing:   Mini squats x 10 reps    Hip abdct x 10 reps bil  Hip ext x 10 reps bil   Calf raises x 20 reps    04/13/21 reviewed pt's HEP, see below.      PATIENT EDUCATION:  Education details: HEP Person educated: Patient Education method: Consulting civil engineer, Media planner, Verbal cues, and Handouts Education comprehension: verbalized understanding, verbal cues required, and needs further education     HOME EXERCISE PROGRAM: Access Code: [email or web address] URL: [email or web address] Date: 04/13/2021 Prepared by: Elsie Ra   Exercises Seated Flexion Stretch - 2 x daily - 6 x weekly - 1 sets - 10 reps - 5 sec hold Supine Lower Trunk Rotation - 2 x daily - 6 x weekly - 1 sets - 10 reps - 5 sec hold Hooklying Single Knee to Chest Stretch - 2 x daily - 6 x weekly - 1 sets - 2 reps - 20sec hold Supine Bridge - 2 x daily - 6 x weekly - 1-2 sets - 10 reps - 5 hold Mini Squat with Counter Support - 2 x daily - 6 x weekly - 1-2 sets - 10 reps Standing Hip Abduction with Counter Support - 2 x daily - 6 x weekly - 1-2 sets - 10 reps Standing Hip Extension with Counter Support - 2 x daily - 6 x weekly - 1-2 sets - 10 reps     ASSESSMENT:   CLINICAL IMPRESSION: Session today focused on assessing pt's balance and improving lumbar ROM and hip  strength. Pt 's TUG time of 12.29 seconds with an AD, shows that the pt is at a decreased risk of falling. However, there were balance deficits noted with turning. Gait navigation exercises were accomplished today to begin addressing limitations, in which the pt tolerated this well. We will continue to progress lumbar ROM, balance, and hip strengthening exercises with continued PT.   OBJECTIVE IMPAIRMENTS: decreased activity tolerance, difficulty walking, decreased balance, decreased endurance, decreased mobility, decreased ROM, decreased strength, impaired flexibility, impaired LE use, postural dysfunction, and pain.   ACTIVITY LIMITATIONS: bending, lifting, carry, locomotion, cleaning, community activity, driving   PERSONAL FACTORS: Aortic valve prosthesis present, Arthritis, Cerebral embolism with transient ischemic attack (TIA), CVA (cerebral vascular accident) ([place]), Neck pain, Osteoporosis, Pulmonary HTN ([place])     REHAB POTENTIAL: Good   CLINICAL DECISION MAKING: moderate   EVALUATION COMPLEXITY: moderate   GOALS: Short term PT Goals (target date for Short term goals are 4 weeks 05/11/21) Pt will be I and compliant with HEP. Baseline:  Goal status: On-going  Pt will decrease pain by 25% overall Baseline: Goal status: On-going    Long term PT goals (target dates for all long term goals are 8 weeks 06/08/21) Pt will improve lumbar rotation and extension ROM to [facility] to improve functional mobility Baseline: Goal status: On-going  Pt will improve  hip/knee strength to at least 4+ /5 MMT to improve functional strength Baseline: Goal status: On-going  Pt will improve FOTO to at least 57% functional to show improved function Baseline: Goal status: On-going  Pt will improve tandem balance test to at least 10 seconds Baseline: Goal status: On-going  She will improve 5TSTS test to less than 22 seconds to improved balance and LE endurance Baseline: Goal status: On-going    PLAN: PT  FREQUENCY: 1-2 times per week    PT DURATION: 8 weeks   PLANNED INTERVENTIONS (unless contraindicated): aquatic PT, cryotherapy, Electrical stimulation, Iontophoresis with 4 mg/ml dexamethasome, Moist heat, traction, Ultrasound, gait training, Therapeutic exercise, balance training, neuromuscular re-education, patient/family education, manual techniques, passive ROM, dry needling, vasopnuematic device,  spinal manipulations, joint manipulations   PLAN FOR NEXT SESSION: continue hip/core strengthening and endurance exercises, Review pt HEP    Ysidro Evert, SPT

## 2021-04-28 DIAGNOSIS — D631 Anemia in chronic kidney disease: Secondary | ICD-10-CM | POA: Diagnosis not present

## 2021-04-28 DIAGNOSIS — Z992 Dependence on renal dialysis: Secondary | ICD-10-CM | POA: Diagnosis not present

## 2021-04-28 DIAGNOSIS — N2581 Secondary hyperparathyroidism of renal origin: Secondary | ICD-10-CM | POA: Diagnosis not present

## 2021-04-28 DIAGNOSIS — N186 End stage renal disease: Secondary | ICD-10-CM | POA: Diagnosis not present

## 2021-04-28 DIAGNOSIS — D509 Iron deficiency anemia, unspecified: Secondary | ICD-10-CM | POA: Diagnosis not present

## 2021-05-01 DIAGNOSIS — Z992 Dependence on renal dialysis: Secondary | ICD-10-CM | POA: Diagnosis not present

## 2021-05-01 DIAGNOSIS — N2581 Secondary hyperparathyroidism of renal origin: Secondary | ICD-10-CM | POA: Diagnosis not present

## 2021-05-01 DIAGNOSIS — D631 Anemia in chronic kidney disease: Secondary | ICD-10-CM | POA: Diagnosis not present

## 2021-05-01 DIAGNOSIS — D509 Iron deficiency anemia, unspecified: Secondary | ICD-10-CM | POA: Diagnosis not present

## 2021-05-01 DIAGNOSIS — N186 End stage renal disease: Secondary | ICD-10-CM | POA: Diagnosis not present

## 2021-05-02 ENCOUNTER — Ambulatory Visit (INDEPENDENT_AMBULATORY_CARE_PROVIDER_SITE_OTHER): Payer: Medicare Other | Admitting: Physical Therapy

## 2021-05-02 ENCOUNTER — Other Ambulatory Visit: Payer: Self-pay

## 2021-05-02 ENCOUNTER — Encounter: Payer: Self-pay | Admitting: Physical Therapy

## 2021-05-02 DIAGNOSIS — M6281 Muscle weakness (generalized): Secondary | ICD-10-CM | POA: Diagnosis not present

## 2021-05-02 DIAGNOSIS — G8929 Other chronic pain: Secondary | ICD-10-CM

## 2021-05-02 DIAGNOSIS — M545 Low back pain, unspecified: Secondary | ICD-10-CM

## 2021-05-02 DIAGNOSIS — R2689 Other abnormalities of gait and mobility: Secondary | ICD-10-CM | POA: Diagnosis not present

## 2021-05-02 DIAGNOSIS — R262 Difficulty in walking, not elsewhere classified: Secondary | ICD-10-CM | POA: Diagnosis not present

## 2021-05-02 NOTE — Therapy (Signed)
OUTPATIENT PHYSICAL THERAPY TREATMENT NOTE   Patient Name: [name] Marks MRN: [id number] DOB:1955/04/01, 66 y.o., female Today's Date: 05/02/2021  PCP: Eppie Gibson, MD REFERRING PROVIDER: Persons, Bevely Palmer, [place]   PT End of Session - 05/02/21 1228     Visit Number 4    Number of Visits 12    Date for PT Re-Evaluation 06/08/21    Authorization Type Medicare    Progress Note Due on Visit 10    PT Start Time 1145    PT Stop Time 1227    PT Time Calculation (min) 42 min    Activity Tolerance Patient tolerated treatment well;Patient limited by pain    Behavior During Therapy [facility] for tasks assessed/performed               Past Medical History:  Diagnosis Date   Anemia    Aortic valve prosthesis present 02/25/2016   Arthritis    "knees" (01/03/2017)   AVD (aortic valve disease) 07/12/2016   Backache 12/06/2008   Bacteremia due to coagulase-negative Staphylococcus    Carpal tunnel syndrome    Cerebral embolism with transient ischemic attack (TIA)    Cholelithiases 01/28/2017   Chronic female pelvic pain 2/67/1245   Complication of anesthesia    01/01/17- '"a long time ago, difficulty breathimg, not sure if it was due to anesthesia or not."   CVA (cerebral vascular accident) ([place]) 05/16/2016   End stage renal disease on dialysis ([place])    "MWF; [place]." (01/03/2017)   Endocarditis    Esophageal reflux 12/06/2008   GERD (gastroesophageal reflux disease)    Gout    History of blood transfusion 2017   "related to blood poison" (01/03/2017)   Increased endometrial stripe thickness 11/03/2015   Neck pain    Osteoporosis 09/2016   T score -2.6   Pain and swelling of right upper extremity 12/20/2015   Pulmonary HTN ([place]) 07/12/2016   Renal dialysis device, implant, or graft complication 80/99/8338   Renal insufficiency    S/P cholecystectomy 02/14/2017   Septic shock ([place])    Staphylococcus aureus bacteremia    TIA (transient ischemic attack)    "several"  (01/03/2017)   Past Surgical History:  Procedure Laterality Date   AORTIC VALVE REPLACEMENT N/A 02/25/2016   Procedure: AORTIC VALVE REPLACEMENT (AVR) implanted with Magna Ease Aortic valve size 94m;  Surgeon: SMelrose Nakayama MD;  [place]  Service: Open Heart Surgery;  Laterality: N/A;   AV FISTULA PLACEMENT Left 01/03/2017   Procedure: INSERTION OF ARTERIOVENOUS (AV) GORE-TEX GRAFT LEFT THIGH;  Surgeon: BSerafina Mitchell MD;  [place]  Service: Vascular;  Laterality: Left;   [place] 02/17/2016   Procedure: REMOVAL OF TWO ARTERIOVENOUS GORETEX GRAFTS ([place];  Surgeon: CAngelia Mould MD;  [place]  Service: Vascular;  Laterality: Right;   BREAST BIOPSY Left 2013   stereo    BREAST BIOPSY Right 2011   stereo    CARDIAC VALVE REPLACEMENT     CHOLECYSTECTOMY N/A 01/30/2017   Procedure: LAPAROSCOPIC CHOLECYSTECTOMY;  Surgeon: CErroll Luna MD;  [place]  Service: General;  Laterality: N/A;   COLONOSCOPY W/ POLYPECTOMY     DG AV DIALYSIS GRAFT DECLOT OR     DILATATION & CURETTAGE/HYSTEROSCOPY WITH TRUECLEAR N/A 11/06/2012   Procedure: DILATATION & CURETTAGE/HYSTEROSCOPY WITH TRUECLEAR;  Surgeon: JTerrance Mass MD;  [place];  Service: Gynecology;  Laterality: N/A;  Truclear Resectoscopic Polypectomy    INSERTION OF DIALYSIS  CATHETER N/A 02/19/2016   Procedure: INSERTION OF Left Internal Jugular DIALYSIS CATHETER;  Surgeon: Angelia Mould, MD;  Location: [place];  Service: Vascular;  Laterality: N/A;   LOOP RECORDER INSERTION N/A 10/01/2016   Procedure: LOOP RECORDER INSERTION;  Surgeon: Constance Haw, MD;  Location: [place] [facility] LAB;  Service: Cardiovascular;  Laterality: N/A;   PATCH ANGIOPLASTY Right 02/17/2016   Procedure: PATCH ANGIOPLASTY;  Surgeon: Angelia Mould, MD;  Location: [place];  Service: Vascular;  Laterality: Right;   PERIPHERAL VASCULAR CATHETERIZATION N/A 09/14/2014   Procedure: A/V  Shuntogram/Fistulagram;  Surgeon: Katha Cabal, MD;  Location: [place] CV LAB;  Service: Cardiovascular;  Laterality: N/A;   PERIPHERAL VASCULAR CATHETERIZATION N/A 09/14/2014   Procedure: A/V Shunt Intervention;  Surgeon: Katha Cabal, MD;  Location: [place] CV LAB;  Service: Cardiovascular;  Laterality: N/A;   PERIPHERAL VASCULAR CATHETERIZATION Right 12/09/2014   Procedure: A/V Shuntogram/Fistulagram;  Surgeon: Algernon Huxley, MD;  Location: [place] CV LAB;  Service: Cardiovascular;  Laterality: Right;   PERIPHERAL VASCULAR CATHETERIZATION N/A 12/09/2014   Procedure: A/V Shunt Intervention;  Surgeon: Algernon Huxley, MD;  Location: [place] CV LAB;  Service: Cardiovascular;  Laterality: N/A;   PERIPHERAL VASCULAR CATHETERIZATION Right 05/24/2015   Procedure: A/V Shuntogram;  Surgeon: Serafina Mitchell, MD;  Location: [place] CV LAB;  Service: Cardiovascular;  Laterality: Right;   PERIPHERAL VASCULAR CATHETERIZATION Right 05/24/2015   Procedure: Peripheral Vascular Balloon Angioplasty;  Surgeon: Serafina Mitchell, MD;  Location: [place] CV LAB;  Service: Cardiovascular;  Laterality: Right;  right arm shunt   PERIPHERAL VASCULAR CATHETERIZATION N/A 06/13/2015   Procedure: A/V Shuntogram/Fistulagram;  Surgeon: Algernon Huxley, MD;  Location: [place] [facility] LAB;  Service: Cardiovascular;  Laterality: N/A;   PERIPHERAL VASCULAR CATHETERIZATION N/A 06/13/2015   Procedure: A/V Shunt Intervention;  Surgeon: Algernon Huxley, MD;  Location: [place] CV LAB;  Service: Cardiovascular;  Laterality: N/A;   TEE WITHOUT CARDIOVERSION N/A 02/22/2016   Procedure: TRANSESOPHAGEAL ECHOCARDIOGRAM (TEE);  Surgeon: Pixie Casino, MD;  Location: [facility] ENDOSCOPY;  Service: Cardiovascular;  Laterality: N/A;   TEE WITHOUT CARDIOVERSION N/A 02/25/2016   Procedure: TRANSESOPHAGEAL ECHOCARDIOGRAM (TEE);  Surgeon: Melrose Nakayama, MD;  Location: [place];  Service: Open Heart Surgery;  Laterality:  N/A;   TEE WITHOUT CARDIOVERSION N/A 10/01/2016   Procedure: TRANSESOPHAGEAL ECHOCARDIOGRAM (TEE);  Surgeon: Pixie Casino, MD;  Location: [facility] ENDOSCOPY;  Service: Cardiovascular;  Laterality: N/A;   [place]   Patient Active Problem List   Diagnosis Date Noted   Tooth, broken 12/18/2018   Hypotension 10/03/2018   Orthostatic hypotension 03/14/2018   S/P cholecystectomy 02/14/2017   Osteoporosis 12/11/2016   Cholelithiasis 11/07/2016   Cerebral embolism with transient ischemic attack (TIA)    AVD (aortic valve disease) 07/12/2016   Pulmonary HTN ([place]) 07/12/2016   Carpal tunnel syndrome 07/05/2016   Disorder of both mastoids 05/29/2016   CVA (cerebral vascular accident) ([place]) 05/16/2016   History of aortic valve replacement with bioprosthetic valve 05/08/2016   Bacteremia due to coagulase-negative Staphylococcus    History of endocarditis    Anemia    Complex endometrial hyperplasia with atypia 11/08/2015   Dependence on renal dialysis ([place]) 12/15/2014   Obesity 10/28/2014   Right carotid bruit 01/08/2013   Generalized headaches 01/08/2013   Chronic female pelvic pain 08/08/2012   Gout, unspecified 12/06/2008   Esophageal reflux 12/06/2008   Backache 12/06/2008   FIBROIDS, UTERUS  11/24/2008   ESRD (end stage renal disease) on dialysis ([place]) 07/01/2006   Anemia in chronic kidney disease 12/18/2001    REFERRING DIAG: M54.50,G89.29 (ICD-10-CM) - Chronic bilateral low back pain without sciatica    THERAPY DIAG:  Muscle weakness (generalized)  Chronic low back pain without sciatica, unspecified back pain laterality  Other abnormalities of gait and mobility  Difficulty in walking, not elsewhere classified  PERTINENT HISTORY: Aortic valve prosthesis present, Arthritis, Cerebral embolism with transient ischemic attack (TIA), CVA (cerebral vascular accident) ([place]), Neck pain, Osteoporosis, Pulmonary HTN ([place]), ESRD on dialysis M/W/F  PRECAUTIONS: fistula in  LLE  SUBJECTIVE: Pt reports the LBP feels okay but has pain in her legs today  PAIN:  Are you having pain? Yes NPRS scale: 4/10 Pain location: both legs PAIN TYPE: aching Pain description: intermittent  Aggravating factors: reports cramping (dialysis) Relieving factors: sleeping     OBJECTIVE:    DIAGNOSTIC FINDINGS:  XR "Radiographs of her lumbar spine demonstrate some listhesis and degenerative changes especially L5-S1 and L4-5 with osteophyte formation at the endplates" MRI from 2018 "New severe spinal stenosis at L4-5 due to accelerated degenerative disc disease"   PATIENT SURVEYS:  04/13/21 FOTO 44%     PALPATION: 04/13/21:  Focal trigger points noted along bilat perispinal's and glutes Moderate hypomobility w/ concordant pain located throughout lumbar spine (L2-L5) PAM's   LUMBARAROM/PROM   AROM AROM  04/13/2021  Flexion 90% of Full ROM  Extension 10 deg.  Right lateral flexion 90% of Full ROM w/ concordant pain  Left lateral flexion 90% of Full ROM w/ concordant pain  Right rotation 50% of Full ROM   Left rotation 75% of Full ROM    (Blank rows = not tested)   LE MMT:   MMT Right 04/13/2021 Left 04/13/2021  Hip flexion 4 4  Hip extension 3+ 3+  Hip abduction 3+ 3+  Hip adduction 4 4  Hip internal rotation 4- 4-  Hip external rotation 3+ 4-   (Blank rows = not tested)   LUMBAR SPECIAL TESTS:  04/13/21 Straight leg raise test: Negative for radicular sx (+ for Hamstring tightness)   FUNCTIONAL TESTS:  04/13/21 04/27/2021 TUG: 12.29sec w/o AD 5 times sit to stand: 27.27s  with UE support from armrests (unable to stand without UE support from armrests) Balance Feet together standing eyes closed for 10 sec Tandem balance less than 3 seconds       TODAY'S TREATMENT   05/02/2021: Therapeutic Exercise:  Aerobic: Nustep Lvl 5 x 8 minutes Supine: Double knee to chest stretch with feet on pball 10 sec X10. Lower trunk rotations 5 sec x 5 bilat. Bridges X  10 holding 5 sec. Clams green X15 Prone:  Seated:Green pball rollouts into lumbar flexion 10 sec X10, then into diagonals right and left 5 sec X 5. Sit to stand from 23.5 inch 2X5 with slow eccentrics  Standing: At counter top- Hip abd and ext. X 10 bilat;  Neuromuscular Re-education: lateral stepping to 3 cones fwd, lateral,back sequence X5 bilat, Navigation through cones x 3 (down and back) Manual Therapy: Therapeutic Activity:  Self Care: Trigger Point Dry Needling:  Modalities:    04/27/2021: Therapeutic Exercise:  Aerobic: Nustep Lvl 5 x 8 minutes Supine: Posterior pelvic tilt x 10 (attempted bridges but pt was too fatigued for this exercise today); Lower trunk rotations x 10 bilat Prone:  Seated:Green pball rollouts into lumbar flexion (middle, rt, lft x 10 each direction)   Standing: At counter top- Hip  abd and ext. X 10 bilat; mini squats x 10; marches x 10 bilat Neuromuscular Re-education: Manual Therapy: Therapeutic Activity: Navigation through cones x 3 (down and back); lateral stepping over wand x 8 bilat  Self Care: Trigger Point Dry Needling:  Modalities:   PATIENT EDUCATION:  Education details: HEP Person educated: Patient Education method: Consulting civil engineer, Media planner, Verbal cues, and Handouts Education comprehension: verbalized understanding, verbal cues required, and needs further education     HOME EXERCISE PROGRAM: Access Code: [email or web address] URL: [email or web address] Date: 04/13/2021 Prepared by: Elsie Ra   Exercises Seated Flexion Stretch - 2 x daily - 6 x weekly - 1 sets - 10 reps - 5 sec hold Supine Lower Trunk Rotation - 2 x daily - 6 x weekly - 1 sets - 10 reps - 5 sec hold Hooklying Single Knee to Chest Stretch - 2 x daily - 6 x weekly - 1 sets - 2 reps - 20sec hold Supine Bridge - 2 x daily - 6 x weekly - 1-2 sets - 10 reps - 5 hold Mini Squat with Counter Support - 2 x daily - 6 x weekly - 1-2 sets - 10 reps Standing Hip Abduction  with Counter Support - 2 x daily - 6 x weekly - 1-2 sets - 10 reps Standing Hip Extension with Counter Support - 2 x daily - 6 x weekly - 1-2 sets - 10 reps     ASSESSMENT:   CLINICAL IMPRESSION: Session continued to focus on improving overall strength, balance and lumbar ROM to her tolerance. She appears to be getting stronger with her exercises. PT recommending to continue POC.   OBJECTIVE IMPAIRMENTS: decreased activity tolerance, difficulty walking, decreased balance, decreased endurance, decreased mobility, decreased ROM, decreased strength, impaired flexibility, impaired LE use, postural dysfunction, and pain.   ACTIVITY LIMITATIONS: bending, lifting, carry, locomotion, cleaning, community activity, driving   PERSONAL FACTORS: Aortic valve prosthesis present, Arthritis, Cerebral embolism with transient ischemic attack (TIA), CVA (cerebral vascular accident) ([place]), Neck pain, Osteoporosis, Pulmonary HTN ([place])     REHAB POTENTIAL: Good   CLINICAL DECISION MAKING: moderate   EVALUATION COMPLEXITY: moderate   GOALS: Short term PT Goals (target date for Short term goals are 4 weeks 05/11/21) Pt will be I and compliant with HEP. Baseline:  Goal status: On-going  Pt will decrease pain by 25% overall Baseline: Goal status: On-going    Long term PT goals (target dates for all long term goals are 8 weeks 06/08/21) Pt will improve lumbar rotation and extension ROM to [facility] to improve functional mobility Baseline: Goal status: On-going  Pt will improve  hip/knee strength to at least 4+ /5 MMT to improve functional strength Baseline: Goal status: On-going  Pt will improve FOTO to at least 57% functional to show improved function Baseline: Goal status: On-going  Pt will improve tandem balance test to at least 10 seconds Baseline: Goal status: On-going  She will improve 5TSTS test to less than 22 seconds to improved balance and LE endurance Baseline: Goal status: On-going     PLAN: PT FREQUENCY: 1-2 times per week    PT DURATION: 8 weeks   PLANNED INTERVENTIONS (unless contraindicated): aquatic PT, cryotherapy, Electrical stimulation, Iontophoresis with 4 mg/ml dexamethasome, Moist heat, traction, Ultrasound, gait training, Therapeutic exercise, balance training, neuromuscular re-education, patient/family education, manual techniques, passive ROM, dry needling, vasopnuematic device, spinal manipulations, joint manipulations   PLAN FOR NEXT SESSION: continue hip/core strengthening and endurance exercises, balance challenges    Elsie Ra, PT, DPT 05/02/21 12:29  PM

## 2021-05-03 ENCOUNTER — Other Ambulatory Visit: Payer: Self-pay | Admitting: Gastroenterology

## 2021-05-03 DIAGNOSIS — Z992 Dependence on renal dialysis: Secondary | ICD-10-CM | POA: Diagnosis not present

## 2021-05-03 DIAGNOSIS — N2581 Secondary hyperparathyroidism of renal origin: Secondary | ICD-10-CM | POA: Diagnosis not present

## 2021-05-03 DIAGNOSIS — D509 Iron deficiency anemia, unspecified: Secondary | ICD-10-CM | POA: Diagnosis not present

## 2021-05-03 DIAGNOSIS — N186 End stage renal disease: Secondary | ICD-10-CM | POA: Diagnosis not present

## 2021-05-03 DIAGNOSIS — D631 Anemia in chronic kidney disease: Secondary | ICD-10-CM | POA: Diagnosis not present

## 2021-05-04 ENCOUNTER — Encounter: Payer: Self-pay | Admitting: Physical Therapy

## 2021-05-04 ENCOUNTER — Other Ambulatory Visit: Payer: Self-pay

## 2021-05-04 ENCOUNTER — Ambulatory Visit (INDEPENDENT_AMBULATORY_CARE_PROVIDER_SITE_OTHER): Payer: Medicare Other | Admitting: Physical Therapy

## 2021-05-04 DIAGNOSIS — R262 Difficulty in walking, not elsewhere classified: Secondary | ICD-10-CM

## 2021-05-04 DIAGNOSIS — M545 Low back pain, unspecified: Secondary | ICD-10-CM

## 2021-05-04 DIAGNOSIS — M6281 Muscle weakness (generalized): Secondary | ICD-10-CM | POA: Diagnosis not present

## 2021-05-04 DIAGNOSIS — R2689 Other abnormalities of gait and mobility: Secondary | ICD-10-CM | POA: Diagnosis not present

## 2021-05-04 DIAGNOSIS — G8929 Other chronic pain: Secondary | ICD-10-CM

## 2021-05-04 NOTE — Therapy (Signed)
OUTPATIENT PHYSICAL THERAPY TREATMENT NOTE   Patient Name: [name] Marks MRN: [id number] DOB:1955-08-15, 66 y.o., female Today's Date: 05/04/2021  PCP: Eppie Gibson, MD REFERRING PROVIDER: Persons, Bevely Palmer, [place]   PT End of Session - 05/04/21 1157     Visit Number 5    Number of Visits 12    Date for PT Re-Evaluation 06/08/21    Authorization Type Medicare    Progress Note Due on Visit 10    PT Start Time 8469    PT Stop Time 1225    PT Time Calculation (min) 40 min    Activity Tolerance Patient tolerated treatment well;Patient limited by fatigue;No increased pain    Behavior During Therapy [facility] for tasks assessed/performed               Past Medical History:  Diagnosis Date   Anemia    Aortic valve prosthesis present 02/25/2016   Arthritis    "knees" (01/03/2017)   AVD (aortic valve disease) 07/12/2016   Backache 12/06/2008   Bacteremia due to coagulase-negative Staphylococcus    Carpal tunnel syndrome    Cerebral embolism with transient ischemic attack (TIA)    Cholelithiases 01/28/2017   Chronic female pelvic pain 08/25/5282   Complication of anesthesia    01/01/17- '"a long time ago, difficulty breathimg, not sure if it was due to anesthesia or not."   CVA (cerebral vascular accident) ([place]) 05/16/2016   End stage renal disease on dialysis ([place])    "MWF; [place]." (01/03/2017)   Endocarditis    Esophageal reflux 12/06/2008   GERD (gastroesophageal reflux disease)    Gout    History of blood transfusion 2017   "related to blood poison" (01/03/2017)   Increased endometrial stripe thickness 11/03/2015   Neck pain    Osteoporosis 09/2016   T score -2.6   Pain and swelling of right upper extremity 12/20/2015   Pulmonary HTN ([place]) 07/12/2016   Renal dialysis device, implant, or graft complication 13/24/4010   Renal insufficiency    S/P cholecystectomy 02/14/2017   Septic shock ([place])    Staphylococcus aureus bacteremia    TIA (transient ischemic attack)     "several" (01/03/2017)   Past Surgical History:  Procedure Laterality Date   AORTIC VALVE REPLACEMENT N/A 02/25/2016   Procedure: AORTIC VALVE REPLACEMENT (AVR) implanted with Magna Ease Aortic valve size 61m;  Surgeon: SMelrose Nakayama MD;  [place]  Service: Open Heart Surgery;  Laterality: N/A;   AV FISTULA PLACEMENT Left 01/03/2017   Procedure: INSERTION OF ARTERIOVENOUS (AV) GORE-TEX GRAFT LEFT THIGH;  Surgeon: BSerafina Mitchell MD;  [place]  Service: Vascular;  Laterality: Left;   AMosineeRight 02/17/2016   Procedure: REMOVAL OF TWO ARTERIOVENOUS GORETEX GRAFTS ([place];  Surgeon: CAngelia Mould MD;  [place]  Service: Vascular;  Laterality: Right;   BREAST BIOPSY Left 2013   stereo    BREAST BIOPSY Right 2011   stereo    CARDIAC VALVE REPLACEMENT     CHOLECYSTECTOMY N/A 01/30/2017   Procedure: LAPAROSCOPIC CHOLECYSTECTOMY;  Surgeon: CErroll Luna MD;  [place]  Service: General;  Laterality: N/A;   COLONOSCOPY W/ POLYPECTOMY     DG AV DIALYSIS GRAFT DECLOT OR     DILATATION & CURETTAGE/HYSTEROSCOPY WITH TRUECLEAR N/A 11/06/2012   Procedure: DILATATION & CURETTAGE/HYSTEROSCOPY WITH TRUECLEAR;  Surgeon: JTerrance Mass MD;  [place];  Service: Gynecology;  Laterality: N/A;  Truclear Resectoscopic Polypectomy    INSERTION  OF DIALYSIS CATHETER N/A 02/19/2016   Procedure: INSERTION OF Left Internal Jugular DIALYSIS CATHETER;  Surgeon: Angelia Mould, MD;  Location: [place];  Service: Vascular;  Laterality: N/A;   LOOP RECORDER INSERTION N/A 10/01/2016   Procedure: LOOP RECORDER INSERTION;  Surgeon: Constance Haw, MD;  Location: [place] CV LAB;  Service: Cardiovascular;  Laterality: N/A;   PATCH ANGIOPLASTY Right 02/17/2016   Procedure: PATCH ANGIOPLASTY;  Surgeon: Angelia Mould, MD;  Location: [place];  Service: Vascular;  Laterality: Right;   PERIPHERAL VASCULAR CATHETERIZATION N/A 09/14/2014   Procedure:  A/V Shuntogram/Fistulagram;  Surgeon: Katha Cabal, MD;  Location: [place] CV LAB;  Service: Cardiovascular;  Laterality: N/A;   PERIPHERAL VASCULAR CATHETERIZATION N/A 09/14/2014   Procedure: A/V Shunt Intervention;  Surgeon: Katha Cabal, MD;  Location: [place] CV LAB;  Service: Cardiovascular;  Laterality: N/A;   PERIPHERAL VASCULAR CATHETERIZATION Right 12/09/2014   Procedure: A/V Shuntogram/Fistulagram;  Surgeon: Algernon Huxley, MD;  Location: [place] CV LAB;  Service: Cardiovascular;  Laterality: Right;   PERIPHERAL VASCULAR CATHETERIZATION N/A 12/09/2014   Procedure: A/V Shunt Intervention;  Surgeon: Algernon Huxley, MD;  Location: [place] CV LAB;  Service: Cardiovascular;  Laterality: N/A;   PERIPHERAL VASCULAR CATHETERIZATION Right 05/24/2015   Procedure: A/V Shuntogram;  Surgeon: Serafina Mitchell, MD;  Location: [place] CV LAB;  Service: Cardiovascular;  Laterality: Right;   PERIPHERAL VASCULAR CATHETERIZATION Right 05/24/2015   Procedure: Peripheral Vascular Balloon Angioplasty;  Surgeon: Serafina Mitchell, MD;  Location: [place] CV LAB;  Service: Cardiovascular;  Laterality: Right;  right arm shunt   PERIPHERAL VASCULAR CATHETERIZATION N/A 06/13/2015   Procedure: A/V Shuntogram/Fistulagram;  Surgeon: Algernon Huxley, MD;  Location: [place] CV LAB;  Service: Cardiovascular;  Laterality: N/A;   PERIPHERAL VASCULAR CATHETERIZATION N/A 06/13/2015   Procedure: A/V Shunt Intervention;  Surgeon: Algernon Huxley, MD;  Location: [place] CV LAB;  Service: Cardiovascular;  Laterality: N/A;   TEE WITHOUT CARDIOVERSION N/A 02/22/2016   Procedure: TRANSESOPHAGEAL ECHOCARDIOGRAM (TEE);  Surgeon: Pixie Casino, MD;  Location: [facility] ENDOSCOPY;  Service: Cardiovascular;  Laterality: N/A;   TEE WITHOUT CARDIOVERSION N/A 02/25/2016   Procedure: TRANSESOPHAGEAL ECHOCARDIOGRAM (TEE);  Surgeon: Mel[place] Nakayama, MD;  Location: [place];  Service: Open Heart Surgery;  Laterality:  N/A;   TEE WITHOUT CARDIOVERSION N/A 10/01/2016   Procedure: TRANSESOPHAGEAL ECHOCARDIOGRAM (TEE);  Surgeon: Pixie Casino, MD;  Location: [facility] ENDOSCOPY;  Service: Cardiovascular;  Laterality: N/A;   [place]   Patient Active Problem List   Diagnosis Date Noted   Tooth, broken 12/18/2018   Hypotension 10/03/2018   Orthostatic hypotension 03/14/2018   S/P cholecystectomy 02/14/2017   Osteoporosis 12/11/2016   Cholelithiasis 11/07/2016   Cerebral embolism with transient ischemic attack (TIA)    AVD (aortic valve disease) 07/12/2016   Pulmonary HTN ([place]) 07/12/2016   Carpal tunnel syndrome 07/05/2016   Disorder of both mastoids 05/29/2016   CVA (cerebral vascular accident) ([place]) 05/16/2016   History of aortic valve replacement with bioprosthetic valve 05/08/2016   Bacteremia due to coagulase-negative Staphylococcus    History of endocarditis    Anemia    Complex endometrial hyperplasia with atypia 11/08/2015   Dependence on renal dialysis ([place]) 12/15/2014   Obesity 10/28/2014   Right carotid bruit 01/08/2013   Generalized headaches 01/08/2013   Chronic female pelvic pain 08/08/2012   Gout, unspecified 12/06/2008   Esophageal reflux 12/06/2008   Backache 12/06/2008  FIBROIDS, UTERUS 11/24/2008   ESRD (end stage renal disease) on dialysis ([place]) 07/01/2006   Anemia in chronic kidney disease 12/18/2001    REFERRING DIAG: M54.50,G89.29 (ICD-10-CM) - Chronic bilateral low back pain without sciatica    THERAPY DIAG:  Muscle weakness (generalized)  Chronic low back pain without sciatica, unspecified back pain laterality  Other abnormalities of gait and mobility  Difficulty in walking, not elsewhere classified  PERTINENT HISTORY: Aortic valve prosthesis present, Arthritis, Cerebral embolism with transient ischemic attack (TIA), CVA (cerebral vascular accident) ([place]), Neck pain, Osteoporosis, Pulmonary HTN ([place]), ESRD on dialysis M/W/F  PRECAUTIONS: fistula in  LLE  SUBJECTIVE: Pt reports her back and leg pain are overall getting better.  PAIN:  Are you having pain? Yes NPRS scale: 4/10 Pain location: both legs PAIN TYPE: aching Pain description: intermittent  Aggravating factors: reports cramping (dialysis) Relieving factors: sleeping     OBJECTIVE:    DIAGNOSTIC FINDINGS:  XR "Radiographs of her lumbar spine demonstrate some listhesis and degenerative changes especially L5-S1 and L4-5 with osteophyte formation at the endplates" MRI from 2018 "New severe spinal stenosis at L4-5 due to accelerated degenerative disc disease"   PATIENT SURVEYS:  04/13/21 FOTO 44%     PALPATION: 04/13/21:  Focal trigger points noted along bilat perispinal's and glutes Moderate hypomobility w/ concordant pain located throughout lumbar spine (L2-L5)    LUMBARAROM/PROM   AROM AROM  04/13/2021  Flexion 90% of Full ROM  Extension 10 deg.  Right lateral flexion 90% of Full ROM w/ concordant pain  Left lateral flexion 90% of Full ROM w/ concordant pain  Right rotation 50% of Full ROM   Left rotation 75% of Full ROM    (Blank rows = not tested)   LE MMT:   MMT Right 04/13/2021 Left 04/13/2021 Rt 05/04/21 Lt 05/04/21  Hip flexion 4 4 4+ 4+  Hip extension 3+ 3+    Hip abduction 3+ 3+ 4 4  Hip adduction 4 4 4+ 4+  Hip internal rotation 4- 4-    Hip external rotation 3+ 4-    Knee flexion   4+ 4+  Knee extension   4+ 4+   (Blank rows = not tested)    FUNCTIONAL TESTS:  04/27/2021 TUG: 12.29sec w/o AD 5 times sit to stand: 27.27s  with UE support from armrests (unable to stand without UE support from armrests) Balance Feet together standing eyes closed for 10 sec Tandem balance less than 3 seconds       TODAY'S TREATMENT   05/04/2021: Therapeutic Exercise: Aerobic: Nustep Lvl 5 x 8 minutes  Supine: Double knee to chest stretch with feet on pball 10 sec X10. Lower trunk rotations 5 sec x 10 bilat. Bridges X 10 holding 5 sec.   Prone:  Seated:Green pball rollouts into lumbar flexion 10 sec X10, then into diagonals right and left 5 sec X 5. LAQ 4# X 20 bilat. Sit to stand from 23 inch 10 with slow eccentrics  Standing: At counter top- Hip abd,ext, marches, and hamstring curls X 15 ea bilat with 4#  Neuromuscular Re-education: lateral stepping to 3 cones fwd, lateral,back sequence X3 bilat, Tandem balance 30 sec X 3 bilat  Manual Therapy: Therapeutic Activity:  Self Care: Trigger Point Dry Needling:  Modalities:    05/02/2021: Therapeutic Exercise:  Aerobic: Nustep Lvl 5 x 8 minutes Supine: Double knee to chest stretch with feet on pball 10 sec X10. Lower trunk rotations 5 sec x 5 bilat. Bridges X 10 holding 5  sec. Clams green X15 Prone:  Seated:Green pball rollouts into lumbar flexion 10 sec X10, then into diagonals right and left 5 sec X 5. Sit to stand from 23.5 inch 2X5 with slow eccentrics  Standing: At counter top- Hip abd and ext. X 10 bilat;  Neuromuscular Re-education: lateral stepping to 3 cones fwd, lateral,back sequence X5 bilat, Navigation through cones x 3 (down and back) Manual Therapy: Therapeutic Activity:  Self Care: Trigger Point Dry Needling:  Modalities:     PATIENT EDUCATION:  Education details: HEP Person educated: Patient Education method: Consulting civil engineer, Media planner, Verbal cues, and Handouts Education comprehension: verbalized understanding, verbal cues required, and needs further education     HOME EXERCISE PROGRAM: Access Code: [email or web address] URL: [email or web address] Date: 04/13/2021 Prepared by: Elsie Ra   Exercises Seated Flexion Stretch - 2 x daily - 6 x weekly - 1 sets - 10 reps - 5 sec hold Supine Lower Trunk Rotation - 2 x daily - 6 x weekly - 1 sets - 10 reps - 5 sec hold Hooklying Single Knee to Chest Stretch - 2 x daily - 6 x weekly - 1 sets - 2 reps - 20sec hold Supine Bridge - 2 x daily - 6 x weekly - 1-2 sets - 10 reps - 5 hold Mini Squat  with Counter Support - 2 x daily - 6 x weekly - 1-2 sets - 10 reps Standing Hip Abduction with Counter Support - 2 x daily - 6 x weekly - 1-2 sets - 10 reps Standing Hip Extension with Counter Support - 2 x daily - 6 x weekly - 1-2 sets - 10 reps     ASSESSMENT:   CLINICAL IMPRESSION: Updated measurements show she is improving in overall leg strength and she is also improving with subjective pain levels overall. PT recommending to continue POC to address her remaining deficits in leg strength and balance   OBJECTIVE IMPAIRMENTS: decreased activity tolerance, difficulty walking, decreased balance, decreased endurance, decreased mobility, decreased ROM, decreased strength, impaired flexibility, impaired LE use, postural dysfunction, and pain.   ACTIVITY LIMITATIONS: bending, lifting, carry, locomotion, cleaning, community activity, driving   PERSONAL FACTORS: Aortic valve prosthesis present, Arthritis, Cerebral embolism with transient ischemic attack (TIA), CVA (cerebral vascular accident) ([place]), Neck pain, Osteoporosis, Pulmonary HTN ([place])     REHAB POTENTIAL: Good   CLINICAL DECISION MAKING: moderate   EVALUATION COMPLEXITY: moderate   GOALS: Short term PT Goals (target date for Short term goals are 4 weeks 05/11/21) Pt will be I and compliant with HEP. Baseline:  Goal status: On-going  Pt will decrease pain by 25% overall Baseline: Goal status: On-going    Long term PT goals (target dates for all long term goals are 8 weeks 06/08/21) Pt will improve lumbar rotation and extension ROM to [facility] to improve functional mobility Baseline: Goal status: On-going  Pt will improve  hip/knee strength to at least 4+ /5 MMT to improve functional strength Baseline: Goal status: On-going  Pt will improve FOTO to at least 57% functional to show improved function Baseline: Goal status: On-going  Pt will improve tandem balance test to at least 10 seconds Baseline: Goal status: On-going  She  will improve 5TSTS test to less than 22 seconds to improved balance and LE endurance Baseline: Goal status: On-going    PLAN: PT FREQUENCY: 1-2 times per week    PT DURATION: 8 weeks   PLANNED INTERVENTIONS (unless contraindicated): aquatic PT, cryotherapy, Electrical stimulation, Iontophoresis with 4 mg/ml dexamethasome, Moist heat, traction, Ultrasound,  gait training, Therapeutic exercise, balance training, neuromuscular re-education, patient/family education, manual techniques, passive ROM, dry needling, vasopnuematic device, spinal manipulations, joint manipulations   PLAN FOR NEXT SESSION: continue hip/core strengthening and endurance exercises, balance challenges    Elsie Ra, PT, DPT 05/04/21 11:58 AM

## 2021-05-05 DIAGNOSIS — N2581 Secondary hyperparathyroidism of renal origin: Secondary | ICD-10-CM | POA: Diagnosis not present

## 2021-05-05 DIAGNOSIS — N186 End stage renal disease: Secondary | ICD-10-CM | POA: Diagnosis not present

## 2021-05-05 DIAGNOSIS — D631 Anemia in chronic kidney disease: Secondary | ICD-10-CM | POA: Diagnosis not present

## 2021-05-05 DIAGNOSIS — D509 Iron deficiency anemia, unspecified: Secondary | ICD-10-CM | POA: Diagnosis not present

## 2021-05-05 DIAGNOSIS — Z992 Dependence on renal dialysis: Secondary | ICD-10-CM | POA: Diagnosis not present

## 2021-05-08 ENCOUNTER — Encounter (HOSPITAL_COMMUNITY): Payer: Self-pay | Admitting: Gastroenterology

## 2021-05-08 DIAGNOSIS — N186 End stage renal disease: Secondary | ICD-10-CM | POA: Diagnosis not present

## 2021-05-08 DIAGNOSIS — D631 Anemia in chronic kidney disease: Secondary | ICD-10-CM | POA: Diagnosis not present

## 2021-05-08 DIAGNOSIS — D509 Iron deficiency anemia, unspecified: Secondary | ICD-10-CM | POA: Diagnosis not present

## 2021-05-08 DIAGNOSIS — N2581 Secondary hyperparathyroidism of renal origin: Secondary | ICD-10-CM | POA: Diagnosis not present

## 2021-05-08 DIAGNOSIS — Z992 Dependence on renal dialysis: Secondary | ICD-10-CM | POA: Diagnosis not present

## 2021-05-08 NOTE — Progress Notes (Signed)
Attempted to obtain medical history via telephone, unable to reach at this time. Unable to leave a voicemail to return pre surgical testing department's phone call.  ?

## 2021-05-09 ENCOUNTER — Encounter: Payer: Self-pay | Admitting: Physical Therapy

## 2021-05-09 ENCOUNTER — Other Ambulatory Visit: Payer: Self-pay

## 2021-05-09 ENCOUNTER — Ambulatory Visit (INDEPENDENT_AMBULATORY_CARE_PROVIDER_SITE_OTHER): Payer: Medicare Other | Admitting: Physical Therapy

## 2021-05-09 DIAGNOSIS — G8929 Other chronic pain: Secondary | ICD-10-CM | POA: Diagnosis not present

## 2021-05-09 DIAGNOSIS — M6281 Muscle weakness (generalized): Secondary | ICD-10-CM

## 2021-05-09 DIAGNOSIS — R2689 Other abnormalities of gait and mobility: Secondary | ICD-10-CM

## 2021-05-09 DIAGNOSIS — R262 Difficulty in walking, not elsewhere classified: Secondary | ICD-10-CM | POA: Diagnosis not present

## 2021-05-09 DIAGNOSIS — M545 Low back pain, unspecified: Secondary | ICD-10-CM | POA: Diagnosis not present

## 2021-05-09 NOTE — Therapy (Signed)
?OUTPATIENT PHYSICAL THERAPY TREATMENT NOTE ? ? ?Patient Name: [name] Marks ?MRN: [id number] ?DOB:[birth year], 66 y.o., female ?Today's Date: 05/09/2021 ? ?PCP: Eppie Gibson, MD ?REFERRING PROVIDER: Eppie Gibson, MD ? ? PT End of Session - 05/09/21 1102   ? ? Visit Number 6   ? Number of Visits 12   ? Date for PT Re-Evaluation 06/08/21   ? Authorization Type Medicare   ? Progress Note Due on Visit 10   ? PT Start Time 1054   ? PT Stop Time 1135   ? PT Time Calculation (min) 41 min   ? Activity Tolerance Patient tolerated treatment well;Patient limited by fatigue;No increased pain   ? Behavior During Therapy [facility] for tasks assessed/performed   ? ?  ?  ? ?  ? ? ? ? ?Past Medical History:  ?Diagnosis Date  ? Anemia   ? Aortic valve prosthesis present 02/25/2016  ? Arthritis   ? "knees" (01/03/2017)  ? AVD (aortic valve disease) 07/12/2016  ? Backache 12/06/2008  ? Bacteremia due to coagulase-negative Staphylococcus   ? Carpal tunnel syndrome   ? Cerebral embolism with transient ischemic attack (TIA)   ? Cholelithiases 01/28/2017  ? Chronic female pelvic pain 08/08/2012  ? Complication of anesthesia   ? 01/01/17- '"a long time ago, difficulty breathimg, not sure if it was due to anesthesia or not."  ? CVA (cerebral vascular accident) ([place]) 05/16/2016  ? End stage renal disease on dialysis [facility])   ? "MWF; [place]." (01/03/2017)  ? Endocarditis   ? Esophageal reflux 12/06/2008  ? GERD (gastroesophageal reflux disease)   ? Gout   ? History of blood transfusion 2017  ? "related to blood poison" (01/03/2017)  ? Increased endometrial stripe thickness 11/03/2015  ? Neck pain   ? Osteoporosis 09/2016  ? T score -2.6  ? Pain and swelling of right upper extremity 12/20/2015  ? Pulmonary HTN ([place]) 07/12/2016  ? Renal dialysis device, implant, or graft complication 11/91/4782  ? Renal insufficiency   ? S/P cholecystectomy 02/14/2017  ? Septic shock ([place])   ? Staphylococcus aureus bacteremia   ? TIA (transient ischemic attack)   ?  "several" (01/03/2017)  ? ?Past Surgical History:  ?Procedure Laterality Date  ? AORTIC VALVE REPLACEMENT N/A 02/25/2016  ? Procedure: AORTIC VALVE REPLACEMENT (AVR) implanted with Magna Ease Aortic valve size 11m;  Surgeon: SMelrose Nakayama MD;  [place]  Service: Open Heart Surgery;  Laterality: N/A;  ? AV FISTULA PLACEMENT Left 01/03/2017  ? Procedure: INSERTION OF ARTERIOVENOUS (AV) GORE-TEX GRAFT LEFT THIGH;  Surgeon: BSerafina Mitchell MD;  [place]  Service: Vascular;  Laterality: Left;  ? ABurnsRight 02/17/2016  ? Procedure: REMOVAL OF TWO ARTERIOVENOUS GORETEX GRAFTS ([place];  Surgeon: CAngelia Mould MD;  [place]  Service: Vascular;  Laterality: Right;  ? BREAST BIOPSY Left 2013  ? stereo   ? BREAST BIOPSY Right 2011  ? stereo   ? CARDIAC VALVE REPLACEMENT    ? CHOLECYSTECTOMY N/A 01/30/2017  ? Procedure: LAPAROSCOPIC CHOLECYSTECTOMY;  Surgeon: CErroll Luna MD;  [place]  Service: General;  Laterality: N/A;  ? COLONOSCOPY W/ POLYPECTOMY    ? DG AV DIALYSIS GRAFT DECLOT OR    ? DILATATION & CURETTAGE/HYSTEROSCOPY WITH TRUECLEAR N/A 11/06/2012  ? Procedure: DILATATION & CURETTAGE/HYSTEROSCOPY WITH TRUECLEAR;  Surgeon: JTerrance Mass MD;  [place];  Service: Gynecology;  Laterality: N/A;  Truclear Resectoscopic Polypectomy ?  ? INSERTION  OF DIALYSIS CATHETER N/A 02/19/2016  ? Procedure: INSERTION OF Left Internal Jugular DIALYSIS CATHETER;  Surgeon: Angelia Mould, MD;  Location: [place];  Service: Vascular;  Laterality: N/A;  ? LOOP RECORDER INSERTION N/A 10/01/2016  ? Procedure: LOOP RECORDER INSERTION;  Surgeon: Constance Haw, MD;  Location: [place] CV LAB;  Service: Cardiovascular;  Laterality: N/A;  ? PATCH ANGIOPLASTY Right 02/17/2016  ? Procedure: PATCH ANGIOPLASTY;  Surgeon: Angelia Mould, MD;  Location: [place];  Service: Vascular;  Laterality: Right;  ? PERIPHERAL VASCULAR CATHETERIZATION N/A 09/14/2014  ? Procedure:  A/V Shuntogram/Fistulagram;  Surgeon: Katha Cabal, MD;  Location: [place] CV LAB;  Service: Cardiovascular;  Laterality: N/A;  ? PERIPHERAL VASCULAR CATHETERIZATION N/A 09/14/2014  ? Procedure: A/V Shunt Intervention;  Surgeon: Katha Cabal, MD;  Location: [place] CV LAB;  Service: Cardiovascular;  Laterality: N/A;  ? PERIPHERAL VASCULAR CATHETERIZATION Right 12/09/2014  ? Procedure: A/V Shuntogram/Fistulagram;  Surgeon: Algernon Huxley, MD;  Location: [place] CV LAB;  Service: Cardiovascular;  Laterality: Right;  ? PERIPHERAL VASCULAR CATHETERIZATION N/A 12/09/2014  ? Procedure: A/V Shunt Intervention;  Surgeon: Algernon Huxley, MD;  Location: [place] CV LAB;  Service: Cardiovascular;  Laterality: N/A;  ? PERIPHERAL VASCULAR CATHETERIZATION Right 05/24/2015  ? Procedure: A/V Shuntogram;  Surgeon: Serafina Mitchell, MD;  Location: [place] CV LAB;  Service: Cardiovascular;  Laterality: Right;  ? PERIPHERAL VASCULAR CATHETERIZATION Right 05/24/2015  ? Procedure: Peripheral Vascular Balloon Angioplasty;  Surgeon: Serafina Mitchell, MD;  Location: [place] CV LAB;  Service: Cardiovascular;  Laterality: Right;  right arm shunt  ? PERIPHERAL VASCULAR CATHETERIZATION N/A 06/13/2015  ? Procedure: A/V Shuntogram/Fistulagram;  Surgeon: Algernon Huxley, MD;  Location: [place] CV LAB;  Service: Cardiovascular;  Laterality: N/A;  ? PERIPHERAL VASCULAR CATHETERIZATION N/A 06/13/2015  ? Procedure: A/V Shunt Intervention;  Surgeon: Algernon Huxley, MD;  Location: [place] CV LAB;  Service: Cardiovascular;  Laterality: N/A;  ? TEE WITHOUT CARDIOVERSION N/A 02/22/2016  ? Procedure: TRANSESOPHAGEAL ECHOCARDIOGRAM (TEE);  Surgeon: Pixie Casino, MD;  Location: [place];  Service: Cardiovascular;  Laterality: N/A;  ? TEE WITHOUT CARDIOVERSION N/A 02/25/2016  ? Procedure: TRANSESOPHAGEAL ECHOCARDIOGRAM (TEE);  Surgeon: [place] Nakayama, MD;  Location: [place];  Service: Open Heart Surgery;  Laterality:  N/A;  ? TEE WITHOUT CARDIOVERSION N/A 10/01/2016  ? Procedure: TRANSESOPHAGEAL ECHOCARDIOGRAM (TEE);  Surgeon: Pixie Casino, MD;  Location: [place];  Service: Cardiovascular;  Laterality: N/A;  ? [place]  ? ?Patient Active Problem List  ? Diagnosis Date Noted  ? Tooth, broken 12/18/2018  ? Hypotension 10/03/2018  ? Orthostatic hypotension 03/14/2018  ? S/P cholecystectomy 02/14/2017  ? Osteoporosis 12/11/2016  ? Cholelithiasis 11/07/2016  ? Cerebral embolism with transient ischemic attack (TIA)   ? AVD (aortic valve disease) 07/12/2016  ? Pulmonary HTN ([place]) 07/12/2016  ? Carpal tunnel syndrome 07/05/2016  ? Disorder of both mastoids 05/29/2016  ? CVA (cerebral vascular accident) ([place]) 05/16/2016  ? History of aortic valve replacement with bioprosthetic valve 05/08/2016  ? Bacteremia due to coagulase-negative Staphylococcus   ? History of endocarditis   ? Anemia   ? Complex endometrial hyperplasia with atypia 11/08/2015  ? Dependence on renal dialysis ([place]) 12/15/2014  ? Obesity 10/28/2014  ? Right carotid bruit 01/08/2013  ? Generalized headaches 01/08/2013  ? Chronic female pelvic pain 08/08/2012  ? Gout, unspecified 12/06/2008  ? Esophageal reflux 12/06/2008  ? Backache 12/06/2008  ?  FIBROIDS, UTERUS 11/24/2008  ? ESRD (end stage renal disease) on dialysis ([place]) 07/01/2006  ? Anemia in chronic kidney disease 12/18/2001  ? ? ?REFERRING DIAG: M54.50,G89.29 (ICD-10-CM) - Chronic bilateral low back pain without sciatica ?  ? ?THERAPY DIAG:  ?Muscle weakness (generalized) ? ?Chronic low back pain without sciatica, unspecified back pain laterality ? ?Other abnormalities of gait and mobility ? ?Difficulty in walking, not elsewhere classified ? ?PERTINENT HISTORY: Aortic valve prosthesis present, Arthritis, Cerebral embolism with transient ischemic attack (TIA), CVA (cerebral vascular accident) ([place]), Neck pain, Osteoporosis, Pulmonary HTN ([place]), ESRD on dialysis M/W/F ? ?PRECAUTIONS: fistula in  LLE ? ?SUBJECTIVE: Pt reports her back and leg pain don't hurt all the time anymore but is having more pain down her legs today ? ?PAIN:  ?Are you having pain? Yes ?NPRS scale: 8/10 ?Pain location: both le

## 2021-05-10 DIAGNOSIS — D509 Iron deficiency anemia, unspecified: Secondary | ICD-10-CM | POA: Diagnosis not present

## 2021-05-10 DIAGNOSIS — Z992 Dependence on renal dialysis: Secondary | ICD-10-CM | POA: Diagnosis not present

## 2021-05-10 DIAGNOSIS — N186 End stage renal disease: Secondary | ICD-10-CM | POA: Diagnosis not present

## 2021-05-10 DIAGNOSIS — N2581 Secondary hyperparathyroidism of renal origin: Secondary | ICD-10-CM | POA: Diagnosis not present

## 2021-05-10 DIAGNOSIS — D631 Anemia in chronic kidney disease: Secondary | ICD-10-CM | POA: Diagnosis not present

## 2021-05-11 ENCOUNTER — Encounter: Payer: Self-pay | Admitting: Physical Therapy

## 2021-05-11 ENCOUNTER — Ambulatory Visit (INDEPENDENT_AMBULATORY_CARE_PROVIDER_SITE_OTHER): Payer: Medicare Other | Admitting: Physical Therapy

## 2021-05-11 ENCOUNTER — Other Ambulatory Visit: Payer: Self-pay

## 2021-05-11 DIAGNOSIS — M545 Low back pain, unspecified: Secondary | ICD-10-CM | POA: Diagnosis not present

## 2021-05-11 DIAGNOSIS — G8929 Other chronic pain: Secondary | ICD-10-CM

## 2021-05-11 DIAGNOSIS — R262 Difficulty in walking, not elsewhere classified: Secondary | ICD-10-CM | POA: Diagnosis not present

## 2021-05-11 DIAGNOSIS — R2689 Other abnormalities of gait and mobility: Secondary | ICD-10-CM | POA: Diagnosis not present

## 2021-05-11 DIAGNOSIS — M6281 Muscle weakness (generalized): Secondary | ICD-10-CM | POA: Diagnosis not present

## 2021-05-11 NOTE — Therapy (Signed)
?OUTPATIENT PHYSICAL THERAPY TREATMENT NOTE ? ? ?Patient Name: [name] Marks ?MRN: [id number] ?DOB:10-21-1955, 66 y.o., female ?Today's Date: 05/11/2021 ? ?PCP: Eppie Gibson, MD ?REFERRING PROVIDER: Persons, Bevely Palmer, PA ? ? PT End of Session - 05/11/21 1155   ? ? Visit Number 7   ? Number of Visits 12   ? Date for PT Re-Evaluation 06/08/21   ? Authorization Type Medicare   ? Progress Note Due on Visit 10   ? PT Start Time 1100   ? PT Stop Time 1140   ? PT Time Calculation (min) 40 min   ? Activity Tolerance Patient tolerated treatment well;Patient limited by fatigue;No increased pain   ? Behavior During Therapy [facility] for tasks assessed/performed   ? ?  ?  ? ?  ? ? ? ? ?Past Medical History:  ?Diagnosis Date  ? Anemia   ? Aortic valve prosthesis present 02/25/2016  ? Arthritis   ? "knees" (01/03/2017)  ? AVD (aortic valve disease) 07/12/2016  ? Backache 12/06/2008  ? Bacteremia due to coagulase-negative Staphylococcus   ? Carpal tunnel syndrome   ? Cerebral embolism with transient ischemic attack (TIA)   ? Cholelithiases 01/28/2017  ? Chronic female pelvic pain 08/08/2012  ? Complication of anesthesia   ? 01/01/17- '"a long time ago, difficulty breathimg, not sure if it was due to anesthesia or not."  ? CVA (cerebral vascular accident) ([place]) 05/16/2016  ? End stage renal disease on dialysis [facility])   ? "MWF; [place]." (01/03/2017)  ? Endocarditis   ? Esophageal reflux 12/06/2008  ? GERD (gastroesophageal reflux disease)   ? Gout   ? History of blood transfusion 2017  ? "related to blood poison" (01/03/2017)  ? Increased endometrial stripe thickness 11/03/2015  ? Neck pain   ? Osteoporosis 09/2016  ? T score -2.6  ? Pain and swelling of right upper extremity 12/20/2015  ? Pulmonary HTN ([place]) 07/12/2016  ? Renal dialysis device, implant, or graft complication 69/62/9528  ? Renal insufficiency   ? S/P cholecystectomy 02/14/2017  ? Septic shock ([place])   ? Staphylococcus aureus bacteremia   ? TIA (transient ischemic attack)    ? "several" (01/03/2017)  ? ?Past Surgical History:  ?Procedure Laterality Date  ? AORTIC VALVE REPLACEMENT N/A 02/25/2016  ? Procedure: AORTIC VALVE REPLACEMENT (AVR) implanted with Magna Ease Aortic valve size 58m;  Surgeon: SMelrose Nakayama MD;  [place]  Service: Open Heart Surgery;  Laterality: N/A;  ? AV FISTULA PLACEMENT Left 01/03/2017  ? Procedure: INSERTION OF ARTERIOVENOUS (AV) GORE-TEX GRAFT LEFT THIGH;  Surgeon: BSerafina Mitchell MD;  [place]  Service: Vascular;  Laterality: Left;  ? AR[place]Right 02/17/2016  ? Procedure: REMOVAL OF TWO ARTERIOVENOUS GORETEX GRAFTS ([place];  Surgeon: CAngelia Mould MD;  [place]  Service: Vascular;  Laterality: Right;  ? BREAST BIOPSY Left 2013  ? stereo   ? BREAST BIOPSY Right 2011  ? stereo   ? CARDIAC VALVE REPLACEMENT    ? CHOLECYSTECTOMY N/A 01/30/2017  ? Procedure: LAPAROSCOPIC CHOLECYSTECTOMY;  Surgeon: CErroll Luna MD;  [place]  Service: General;  Laterality: N/A;  ? COLONOSCOPY W/ POLYPECTOMY    ? DG AV DIALYSIS GRAFT DECLOT OR    ? DILATATION & CURETTAGE/HYSTEROSCOPY WITH TRUECLEAR N/A 11/06/2012  ? Procedure: DILATATION & CURETTAGE/HYSTEROSCOPY WITH TRUECLEAR;  Surgeon: JTerrance Mass MD;  [place];  Service: Gynecology;  Laterality: N/A;  Truclear Resectoscopic Polypectomy ?  ? INSERTION  OF DIALYSIS CATHETER N/A 02/19/2016  ? Procedure: INSERTION OF Left Internal Jugular DIALYSIS CATHETER;  Surgeon: Angelia Mould, MD;  Location: [place];  Service: Vascular;  Laterality: N/A;  ? LOOP RECORDER INSERTION N/A 10/01/2016  ? Procedure: LOOP RECORDER INSERTION;  Surgeon: Constance Haw, MD;  Location: [place] CV LAB;  Service: Cardiovascular;  Laterality: N/A;  ? PATCH ANGIOPLASTY Right 02/17/2016  ? Procedure: PATCH ANGIOPLASTY;  Surgeon: Angelia Mould, MD;  Location: [place];  Service: Vascular;  Laterality: Right;  ? PERIPHERAL VASCULAR CATHETERIZATION N/A 09/14/2014  ? Procedure:  A/V Shuntogram/Fistulagram;  Surgeon: Katha Cabal, MD;  Location: [place] CV LAB;  Service: Cardiovascular;  Laterality: N/A;  ? PERIPHERAL VASCULAR CATHETERIZATION N/A 09/14/2014  ? Procedure: A/V Shunt Intervention;  Surgeon: Katha Cabal, MD;  Location: [place] CV LAB;  Service: Cardiovascular;  Laterality: N/A;  ? PERIPHERAL VASCULAR CATHETERIZATION Right 12/09/2014  ? Procedure: A/V Shuntogram/Fistulagram;  Surgeon: Algernon Huxley, MD;  Location: [place] CV LAB;  Service: Cardiovascular;  Laterality: Right;  ? PERIPHERAL VASCULAR CATHETERIZATION N/A 12/09/2014  ? Procedure: A/V Shunt Intervention;  Surgeon: Algernon Huxley, MD;  Location: [place] CV LAB;  Service: Cardiovascular;  Laterality: N/A;  ? PERIPHERAL VASCULAR CATHETERIZATION Right 05/24/2015  ? Procedure: A/V Shuntogram;  Surgeon: Serafina Mitchell, MD;  Location: [place] CV LAB;  Service: Cardiovascular;  Laterality: Right;  ? PERIPHERAL VASCULAR CATHETERIZATION Right 05/24/2015  ? Procedure: Peripheral Vascular Balloon Angioplasty;  Surgeon: Serafina Mitchell, MD;  Location: [place] CV LAB;  Service: Cardiovascular;  Laterality: Right;  right arm shunt  ? PERIPHERAL VASCULAR CATHETERIZATION N/A 06/13/2015  ? Procedure: A/V Shuntogram/Fistulagram;  Surgeon: Algernon Huxley, MD;  Location: [place] CV LAB;  Service: Cardiovascular;  Laterality: N/A;  ? PERIPHERAL VASCULAR CATHETERIZATION N/A 06/13/2015  ? Procedure: A/V Shunt Intervention;  Surgeon: Algernon Huxley, MD;  Location: [place] CV LAB;  Service: Cardiovascular;  Laterality: N/A;  ? TEE WITHOUT CARDIOVERSION N/A 02/22/2016  ? Procedure: TRANSESOPHAGEAL ECHOCARDIOGRAM (TEE);  Surgeon: Pixie Casino, MD;  Location: [place];  Service: Cardiovascular;  Laterality: N/A;  ? TEE WITHOUT CARDIOVERSION N/A 02/25/2016  ? Procedure: TRANSESOPHAGEAL ECHOCARDIOGRAM (TEE);  Surgeon: Melrose Nakayama, MD;  Location: [place];  Service: Open Heart Surgery;  Laterality:  N/A;  ? TEE WITHOUT CARDIOVERSION N/A 10/01/2016  ? Procedure: TRANSESOPHAGEAL ECHOCARDIOGRAM (TEE);  Surgeon: Pixie Casino, MD;  Location: [place];  Service: Cardiovascular;  Laterality: N/A;  ? [place]  ? ?Patient Active Problem List  ? Diagnosis Date Noted  ? Tooth, broken 12/18/2018  ? Hypotension 10/03/2018  ? Orthostatic hypotension 03/14/2018  ? S/P cholecystectomy 02/14/2017  ? Osteoporosis 12/11/2016  ? Cholelithiasis 11/07/2016  ? Cerebral embolism with transient ischemic attack (TIA)   ? AVD (aortic valve disease) 07/12/2016  ? Pulmonary HTN ([place]) 07/12/2016  ? Carpal tunnel syndrome 07/05/2016  ? Disorder of both mastoids 05/29/2016  ? CVA (cerebral vascular accident) ([place]) 05/16/2016  ? History of aortic valve replacement with bioprosthetic valve 05/08/2016  ? Bacteremia due to coagulase-negative Staphylococcus   ? History of endocarditis   ? Anemia   ? Complex endometrial hyperplasia with atypia 11/08/2015  ? Dependence on renal dialysis ([place]) 12/15/2014  ? Obesity 10/28/2014  ? Right carotid bruit 01/08/2013  ? Generalized headaches 01/08/2013  ? Chronic female pelvic pain 08/08/2012  ? Gout, unspecified 12/06/2008  ? Esophageal reflux 12/06/2008  ? Backache 12/06/2008  ?  FIBROIDS, UTERUS 11/24/2008  ? ESRD (end stage renal disease) on dialysis ([place]) 07/01/2006  ? Anemia in chronic kidney disease 12/18/2001  ? ? ?REFERRING DIAG: M54.50,G89.29 (ICD-10-CM) - Chronic bilateral low back pain without sciatica ?  ? ?THERAPY DIAG:  ?Muscle weakness (generalized) ? ?Chronic low back pain without sciatica, unspecified back pain laterality ? ?Other abnormalities of gait and mobility ? ?Difficulty in walking, not elsewhere classified ? ?PERTINENT HISTORY: Aortic valve prosthesis present, Arthritis, Cerebral embolism with transient ischemic attack (TIA), CVA (cerebral vascular accident) ([place]), Neck pain, Osteoporosis, Pulmonary HTN ([place]), ESRD on dialysis M/W/F ? ?PRECAUTIONS: fistula in  LLE ? ?SUBJECTIVE: Pt reports the pain is her left leg and knee, sharp pains but only happens some of the time like when she first stands up. ? ?PAIN:  ?Are you having pain? Yes ?NPRS scale: 6/10 ?Pain l

## 2021-05-12 DIAGNOSIS — D631 Anemia in chronic kidney disease: Secondary | ICD-10-CM | POA: Diagnosis not present

## 2021-05-12 DIAGNOSIS — N2581 Secondary hyperparathyroidism of renal origin: Secondary | ICD-10-CM | POA: Diagnosis not present

## 2021-05-12 DIAGNOSIS — D509 Iron deficiency anemia, unspecified: Secondary | ICD-10-CM | POA: Diagnosis not present

## 2021-05-12 DIAGNOSIS — N186 End stage renal disease: Secondary | ICD-10-CM | POA: Diagnosis not present

## 2021-05-12 DIAGNOSIS — Z992 Dependence on renal dialysis: Secondary | ICD-10-CM | POA: Diagnosis not present

## 2021-05-15 DIAGNOSIS — D631 Anemia in chronic kidney disease: Secondary | ICD-10-CM | POA: Diagnosis not present

## 2021-05-15 DIAGNOSIS — D509 Iron deficiency anemia, unspecified: Secondary | ICD-10-CM | POA: Diagnosis not present

## 2021-05-15 DIAGNOSIS — N186 End stage renal disease: Secondary | ICD-10-CM | POA: Diagnosis not present

## 2021-05-15 DIAGNOSIS — N2581 Secondary hyperparathyroidism of renal origin: Secondary | ICD-10-CM | POA: Diagnosis not present

## 2021-05-15 DIAGNOSIS — Z992 Dependence on renal dialysis: Secondary | ICD-10-CM | POA: Diagnosis not present

## 2021-05-15 NOTE — Anesthesia Preprocedure Evaluation (Addendum)
Anesthesia Evaluation  ?Patient identified by MRN, date of birth, ID band ?Patient awake ? ? ? ?Reviewed: ?Allergy & Precautions, NPO status , Patient's Chart, lab work & pertinent test results ? ?Airway ?Mallampati: II ? ?TM Distance: >3 FB ?Neck ROM: Full ? ? ? Dental ?no notable dental hx. ?(+) Poor Dentition ?  ?Pulmonary ?neg pulmonary ROS,  ?  ?Pulmonary exam normal ?breath sounds clear to auscultation ? ? ? ? ? ? Cardiovascular ?negative cardio ROS ?Normal cardiovascular exam+ dysrhythmias II+ Valvular Problems/Murmurs AS  ?Rhythm:Regular Rate:Normal ? ? ?  ?Neuro/Psych ? Headaches, TIACVA   ? GI/Hepatic ?GERD  ,  ?Endo/Other  ?negative endocrine ROS ? Renal/GU ?Dialysis and ESRFRenal diseaseMWF dialysis  ? ?  ?Musculoskeletal ? ?(+) Arthritis ,  ? Abdominal ?  ?Peds ? Hematology ? ?(+) Blood dyscrasia, anemia ,   ?Anesthesia Other Findings ?All: ancef, naproxen ? Reproductive/Obstetrics ? ?  ? ? ? ? ? ? ? ? ? ? ? ? ? ?  ?  ? ? ? ? ? ? ? ?Anesthesia Physical ?Anesthesia Plan ? ?ASA: 3 ? ?Anesthesia Plan: MAC  ? ?Post-op Pain Management:   ? ?Induction:  ? ?PONV Risk Score and Plan: Treatment may vary due to age or medical condition ? ?Airway Management Planned: Natural Airway and Nasal Cannula ? ?Additional Equipment: None ? ?Intra-op Plan:  ? ?Post-operative Plan:  ? ?Informed Consent: I have reviewed the patients History and Physical, chart, labs and discussed the procedure including the risks, benefits and alternatives for the proposed anesthesia with the patient or authorized representative who has indicated his/her understanding and acceptance.  ? ? ? ?Dental advisory given ? ?Plan Discussed with:  ? ?Anesthesia Plan Comments:   ? ? ? ? ? ?Anesthesia Quick Evaluation ? ?

## 2021-05-16 ENCOUNTER — Encounter (HOSPITAL_COMMUNITY): Payer: Self-pay | Admitting: Gastroenterology

## 2021-05-16 ENCOUNTER — Ambulatory Visit (HOSPITAL_BASED_OUTPATIENT_CLINIC_OR_DEPARTMENT_OTHER): Payer: Medicare Other | Admitting: Anesthesiology

## 2021-05-16 ENCOUNTER — Ambulatory Visit (HOSPITAL_COMMUNITY)
Admission: RE | Admit: 2021-05-16 | Discharge: 2021-05-16 | Disposition: A | Discharge status: Home / Self Care | Payer: Medicare Other | Source: Ambulatory Visit | Attending: Gastroenterology | Admitting: Gastroenterology

## 2021-05-16 ENCOUNTER — Encounter: Payer: Medicare Other | Admitting: Physical Therapy

## 2021-05-16 ENCOUNTER — Ambulatory Visit (HOSPITAL_COMMUNITY): Payer: Medicare Other | Admitting: Anesthesiology

## 2021-05-16 ENCOUNTER — Encounter (HOSPITAL_COMMUNITY)
Admission: RE | Disposition: A | Discharge status: Home / Self Care | Payer: Self-pay | Source: Ambulatory Visit | Attending: Gastroenterology

## 2021-05-16 ENCOUNTER — Other Ambulatory Visit: Payer: Self-pay

## 2021-05-16 DIAGNOSIS — Z1211 Encounter for screening for malignant neoplasm of colon: Secondary | ICD-10-CM | POA: Diagnosis not present

## 2021-05-16 DIAGNOSIS — Q438 Other specified congenital malformations of intestine: Secondary | ICD-10-CM | POA: Insufficient documentation

## 2021-05-16 DIAGNOSIS — D631 Anemia in chronic kidney disease: Secondary | ICD-10-CM | POA: Diagnosis not present

## 2021-05-16 DIAGNOSIS — Z992 Dependence on renal dialysis: Secondary | ICD-10-CM | POA: Diagnosis not present

## 2021-05-16 DIAGNOSIS — K640 First degree hemorrhoids: Secondary | ICD-10-CM | POA: Diagnosis not present

## 2021-05-16 DIAGNOSIS — Z860100 Personal history of colon polyps, unspecified: Secondary | ICD-10-CM

## 2021-05-16 DIAGNOSIS — K635 Polyp of colon: Secondary | ICD-10-CM

## 2021-05-16 DIAGNOSIS — K5730 Diverticulosis of large intestine without perforation or abscess without bleeding: Secondary | ICD-10-CM | POA: Diagnosis not present

## 2021-05-16 DIAGNOSIS — N186 End stage renal disease: Secondary | ICD-10-CM | POA: Diagnosis not present

## 2021-05-16 DIAGNOSIS — D124 Benign neoplasm of descending colon: Secondary | ICD-10-CM | POA: Insufficient documentation

## 2021-05-16 HISTORY — PX: COLONOSCOPY WITH PROPOFOL: SHX5780

## 2021-05-16 HISTORY — PX: BIOPSY: SHX5522

## 2021-05-16 LAB — POCT I-STAT, CHEM 8
BUN: 20 mg/dL (ref 8–23)
Calcium, Ion: 0.99 mmol/L — ABNORMAL LOW (ref 1.15–1.40)
Chloride: 96 mmol/L — ABNORMAL LOW (ref 98–111)
Creatinine, Ser: 6.4 mg/dL — ABNORMAL HIGH (ref 0.44–1.00)
Glucose, Bld: 97 mg/dL (ref 70–99)
HCT: 31 % — ABNORMAL LOW (ref 36.0–46.0)
Hemoglobin: 10.5 g/dL — ABNORMAL LOW (ref 12.0–15.0)
Potassium: 4.3 mmol/L (ref 3.5–5.1)
Sodium: 135 mmol/L (ref 135–145)
TCO2: 32 mmol/L (ref 22–32)

## 2021-05-16 SURGERY — COLONOSCOPY WITH PROPOFOL
Anesthesia: Monitor Anesthesia Care

## 2021-05-16 MED ORDER — LIDOCAINE 2% (20 MG/ML) 5 ML SYRINGE
INTRAMUSCULAR | Status: DC | PRN
  Administered 2021-05-16: 60 mg via INTRAVENOUS

## 2021-05-16 MED ORDER — SODIUM CHLORIDE 0.9 % IV SOLN: INTRAVENOUS | Status: DC

## 2021-05-16 MED ORDER — PROPOFOL 500 MG/50ML IV EMUL
INTRAVENOUS | Status: DC | PRN
  Administered 2021-05-16: 100 ug/kg/min via INTRAVENOUS

## 2021-05-16 MED ORDER — EPHEDRINE SULFATE-NACL 50-0.9 MG/10ML-% IV SOSY
PREFILLED_SYRINGE | INTRAVENOUS | Status: DC | PRN
  Administered 2021-05-16: 10 mg via INTRAVENOUS

## 2021-05-16 MED ORDER — PHENYLEPHRINE 40 MCG/ML (10ML) SYRINGE FOR IV PUSH (FOR BLOOD PRESSURE SUPPORT)
PREFILLED_SYRINGE | INTRAVENOUS | Status: DC | PRN
  Administered 2021-05-16: 120 ug via INTRAVENOUS
  Administered 2021-05-16: 80 ug via INTRAVENOUS

## 2021-05-16 MED ORDER — PROPOFOL 10 MG/ML IV BOLUS
INTRAVENOUS | Status: DC | PRN
  Administered 2021-05-16: 20 mg via INTRAVENOUS
  Administered 2021-05-16: 30 mg via INTRAVENOUS

## 2021-05-16 SURGICAL SUPPLY — 22 items

## 2021-05-16 NOTE — Transfer of Care (Signed)
Immediate Anesthesia Transfer of Care Note ? ?Patient: [name] Marks ? ?Procedure(s) Performed: COLONOSCOPY WITH PROPOFOL ?BIOPSY ? ?Patient Location: Endoscopy Unit ? ?Anesthesia Type:MAC ? ?Level of Consciousness: awake, alert , oriented and patient cooperative ? ?Airway & Oxygen Therapy: Patient Spontanous Breathing ? ?Post-op Assessment: Report given to RN, Post -op Vital signs reviewed and stable and Patient moving all extremities ? ?Post vital signs: Reviewed and stable ? ?Last Vitals:  ?Vitals Value Taken Time  ?BP 77/57 05/16/21 1206  ?Temp 36.8 ?C 05/16/21 1203  ?Pulse 85 05/16/21 1206  ?Resp 23 05/16/21 1206  ?SpO2 100 % 05/16/21 1206  ?Vitals shown include unvalidated device data. ? ?Last Pain:  ?Vitals:  ? 05/16/21 1203  ?TempSrc: Temporal  ?PainSc: 0-No pain  ?   ? ?  ? ?Complications: No notable events documented. ?

## 2021-05-16 NOTE — Discharge Instructions (Signed)

## 2021-05-16 NOTE — Interval H&P Note (Signed)
History and Physical Interval Note: ? ?05/16/2021 ?11:26 AM ? ?[name] Marks  has presented today for surgery, with the diagnosis of History of colon polyps.  The various methods of treatment have been discussed with the patient and family. After consideration of risks, benefits and other options for treatment, the patient has consented to  Procedure(s): ?COLONOSCOPY WITH PROPOFOL (N/A) as a surgical intervention.  The patient's history has been reviewed, patient examined, no change in status, stable for surgery.  I have reviewed the patient's chart and labs.  Questions were answered to the patient's satisfaction.   ? ? ?Lear Ng ? ? ?

## 2021-05-16 NOTE — Op Note (Signed)
[facility] ?Patient Name: [name] Marks ?Procedure Date: 05/16/2021 ?MRN: [id number] ?Attending MD: Lear Ng , MD ?Date of Birth: Oct 29, 1955 ?CSN: [id number] ?Age: 66 ?Admit Type: Outpatient ?Procedure:                Colonoscopy ?Indications:              High risk colon cancer surveillance: Personal  ?                          history of adenoma less than 10 mm in size ?Providers:                Lear Ng, MD, Ladoris Gene, RN,  ?                          Benetta Spar, Technician ?Referring MD:             [facility] ?Medicines:                Propofol per Anesthesia, Monitored Anesthesia Care ?Complications:            Hemorrhage (Barotrauma) ?Estimated Blood Loss:     Estimated blood loss was minimal. ?Procedure:                Pre-Anesthesia Assessment: ?                          - Prior to the procedure, a History and Physical  ?                          was performed, and patient medications and  ?                          allergies were reviewed. The patient's tolerance of  ?                          previous anesthesia was also reviewed. The risks  ?                          and benefits of the procedure and the sedation  ?                          options and risks were discussed with the patient.  ?                          All questions were answered, and informed consent  ?                          was obtained. Prior Anticoagulants: The patient has  ?                          taken Plavix (clopidogrel), last dose was 5 days  ?                          prior to procedure. ASA Grade Assessment: III - A  ?  patient with severe systemic disease. After  ?                          reviewing the risks and benefits, the patient was  ?                          deemed in satisfactory condition to undergo the  ?                          procedure. ?                          After obtaining informed consent, the colonoscope  ?                           was passed under direct vision. Throughout the  ?                          procedure, the patient's blood pressure, pulse, and  ?                          oxygen saturations were monitored continuously. The  ?                          [id number]-HQ190L ([id number]) Olympus colonoscope was  ?                          introduced through the anus and advanced to the the  ?                          cecum, identified by appendiceal orifice and  ?                          ileocecal valve. The colonoscopy was performed with  ?                          difficulty due to significant looping and the  ?                          patient's discomfort during the procedure.  ?                          Successful completion of the procedure was aided by  ?                          straightening and shortening the scope to obtain  ?                          bowel loop reduction. The patient tolerated the  ?                          procedure fairly well. The quality of the bowel  ?  preparation was fair and fair but repeated  ?                          irrigation led to a good and adequate prep. The  ?                          ileocecal valve, appendiceal orifice, and rectum  ?                          were photographed. ?Scope In: 11:47:10 AM ?Scope Out: 11:57:33 AM ?Scope Withdrawal Time: 0 hours 5 minutes 41 seconds  ?Total Procedure Duration: 0 hours 10 minutes 23 seconds  ?Findings: ?     The perianal and digital rectal examinations were normal. ?     A 5 mm polyp was found in the descending colon. The polyp was  ?     semi-sessile. The polyp was removed with a cold biopsy forceps.  ?     Resection and retrieval were complete. Estimated blood loss was minimal. ?     Multiple small and large-mouthed diverticula were found in the sigmoid  ?     colon. ?     Internal hemorrhoids were found during retroflexion. The hemorrhoids  ?     were large and Grade I (internal hemorrhoids that do not  prolapse). ?Impression:               - Preparation of the colon was fair. ?                          - One 5 mm polyp in the descending colon, removed  ?                          with a cold biopsy forceps. Resected and retrieved. ?                          - Diverticulosis in the sigmoid colon. ?                          - Internal hemorrhoids. ?Moderate Sedation: ?     N/A - MAC procedure ?Recommendation:           - Await pathology results. ?                          - Resume Plavix (clopidogrel) at prior dose in 2  ?                          days. ?                          - Repeat colonoscopy for surveillance based on  ?                          pathology results. ?                          - High fiber diet. ?Procedure Code(s):        --- Professional --- ?  67255, Colonoscopy, flexible; with biopsy, single  ?                          or multiple ?Diagnosis Code(s):        --- Professional --- ?                          Z86.010, Personal history of colonic polyps ?                          K63.5, Polyp of colon ?                          K64.0, First degree hemorrhoids ?                          K57.30, Diverticulosis of large intestine without  ?                          perforation or abscess without bleeding ?CPT copyright 2019 [employer]. All rights reserved. ?The codes documented in this report are preliminary and upon coder review may  ?be revised to meet current compliance requirements. ?Lear Ng, MD ?05/16/2021 12:12:52 PM ?This report has been signed electronically. ?Number of Addenda: 0 ?

## 2021-05-16 NOTE — H&P (View-Only) (Signed)
Date of Initial H&P: 05/03/21 ? ?History reviewed, patient examined, no change in status, stable for surgery.  ? ?

## 2021-05-16 NOTE — Anesthesia Postprocedure Evaluation (Signed)
Anesthesia Post Note ? ?Patient: [name] Marks ? ?Procedure(s) Performed: COLONOSCOPY WITH PROPOFOL ?BIOPSY ? ?  ? ?Patient location during evaluation: Endoscopy ?Anesthesia Type: MAC ?Level of consciousness: awake and alert ?Pain management: pain level controlled ?Vital Signs Assessment: post-procedure vital signs reviewed and stable ?Respiratory status: spontaneous breathing, nonlabored ventilation, respiratory function stable and patient connected to nasal cannula oxygen ?Cardiovascular status: blood pressure returned to baseline and stable ?Postop Assessment: no apparent nausea or vomiting ?Anesthetic complications: no ? ? ?No notable events documented. ? ?Last Vitals:  ?Vitals:  ? 05/16/21 1210 05/16/21 1220  ?BP: (!) 84/39 (!) 94/46  ?Pulse: 81 72  ?Resp: 20 (!) 24  ?Temp:    ?SpO2: 100% 93%  ?  ?Last Pain:  ?Vitals:  ? 05/16/21 1220  ?TempSrc:   ?PainSc: 0-No pain  ? ? ?  ?  ?  ?  ?  ?  ? ?Barnet [place] ? ? ? ? ?

## 2021-05-16 NOTE — Progress Notes (Signed)
BP 90's/40's in recovery area s/p colonoscopy. Dr Valma Cava informed, ok for discharge home per MD. ?

## 2021-05-16 NOTE — Consult Note (Signed)
Date of Initial H&P: 05/03/21 ? ?History reviewed, patient examined, no change in status, stable for surgery.  ? ?

## 2021-05-17 DIAGNOSIS — N2581 Secondary hyperparathyroidism of renal origin: Secondary | ICD-10-CM | POA: Diagnosis not present

## 2021-05-17 DIAGNOSIS — D509 Iron deficiency anemia, unspecified: Secondary | ICD-10-CM | POA: Diagnosis not present

## 2021-05-17 DIAGNOSIS — N186 End stage renal disease: Secondary | ICD-10-CM | POA: Diagnosis not present

## 2021-05-17 DIAGNOSIS — Z992 Dependence on renal dialysis: Secondary | ICD-10-CM | POA: Diagnosis not present

## 2021-05-17 DIAGNOSIS — D631 Anemia in chronic kidney disease: Secondary | ICD-10-CM | POA: Diagnosis not present

## 2021-05-17 LAB — SURGICAL PATHOLOGY

## 2021-05-18 ENCOUNTER — Ambulatory Visit (INDEPENDENT_AMBULATORY_CARE_PROVIDER_SITE_OTHER): Payer: Medicare Other | Admitting: Physical Therapy

## 2021-05-18 ENCOUNTER — Encounter: Payer: Self-pay | Admitting: Physical Therapy

## 2021-05-18 ENCOUNTER — Other Ambulatory Visit: Payer: Self-pay

## 2021-05-18 DIAGNOSIS — R2689 Other abnormalities of gait and mobility: Secondary | ICD-10-CM

## 2021-05-18 DIAGNOSIS — G8929 Other chronic pain: Secondary | ICD-10-CM

## 2021-05-18 DIAGNOSIS — M545 Low back pain, unspecified: Secondary | ICD-10-CM | POA: Diagnosis not present

## 2021-05-18 DIAGNOSIS — R262 Difficulty in walking, not elsewhere classified: Secondary | ICD-10-CM | POA: Diagnosis not present

## 2021-05-18 DIAGNOSIS — M6281 Muscle weakness (generalized): Secondary | ICD-10-CM

## 2021-05-18 NOTE — Therapy (Signed)
?OUTPATIENT PHYSICAL THERAPY TREATMENT NOTE/Discharge ?PHYSICAL THERAPY DISCHARGE SUMMARY ? ?Visits from Start of Care: 8 ? ?Current functional level related to goals / functional outcomes: ?See below ?  ?Remaining deficits: ?See below ?  ?Education / Equipment: ?HEP ?Plan: ?Patient agrees to discharge.  Patient goals were met. Patient is being discharged due to meeting the stated rehab goals.    ? ? ? ? ? ?Patient Name: [name] Marks ?MRN: [id number] ?DOB:1955/06/14, 67 y.o., female ?Today's Date: 05/18/2021 ? ?PCP: Eppie Gibson, MD ?REFERRING PROVIDER: Persons, Bevely Palmer, PA ? ? PT End of Session - 05/18/21 1152   ? ? Visit Number 8   ? Number of Visits 12   ? Date for PT Re-Evaluation 06/08/21   ? Authorization Type Medicare   ? Progress Note Due on Visit 10   ? PT Start Time 1145   ? PT Stop Time 1225   ? PT Time Calculation (min) 40 min   ? Activity Tolerance Patient tolerated treatment well;No increased pain   ? Behavior During Therapy [facility] for tasks assessed/performed   ? ?  ?  ? ?  ? ? ? ? ?Past Medical History:  ?Diagnosis Date  ? Anemia   ? Aortic valve prosthesis present 02/25/2016  ? Arthritis   ? "knees" (01/03/2017)  ? AVD (aortic valve disease) 07/12/2016  ? Backache 12/06/2008  ? Bacteremia due to coagulase-negative Staphylococcus   ? Carpal tunnel syndrome   ? Cerebral embolism with transient ischemic attack (TIA)   ? Cholelithiases 01/28/2017  ? Chronic female pelvic pain 08/08/2012  ? Complication of anesthesia   ? 01/01/17- '"a long time ago, difficulty breathimg, not sure if it was due to anesthesia or not."  ? CVA (cerebral vascular accident) ([place]) 05/16/2016  ? End stage renal disease on dialysis [facility])   ? "MWF; [place]." (01/03/2017)  ? Endocarditis   ? Esophageal reflux 12/06/2008  ? GERD (gastroesophageal reflux disease)   ? Gout   ? History of blood transfusion 2017  ? "related to blood poison" (01/03/2017)  ? Increased endometrial stripe thickness 11/03/2015  ? Neck pain   ? Osteoporosis  09/2016  ? T score -2.6  ? Pain and swelling of right upper extremity 12/20/2015  ? Pulmonary HTN ([place]) 07/12/2016  ? Renal dialysis device, implant, or graft complication 01/60/1093  ? Renal insufficiency   ? S/P cholecystectomy 02/14/2017  ? Septic shock ([place])   ? Staphylococcus aureus bacteremia   ? TIA (transient ischemic attack)   ? "several" (01/03/2017)  ? ?Past Surgical History:  ?Procedure Laterality Date  ? AORTIC VALVE REPLACEMENT N/A 02/25/2016  ? Procedure: AORTIC VALVE REPLACEMENT (AVR) implanted with Magna Ease Aortic valve size 21m;  Surgeon: SMelrose Nakayama MD;  [place]  Service: Open Heart Surgery;  Laterality: N/A;  ? AV FISTULA PLACEMENT Left 01/03/2017  ? Procedure: INSERTION OF ARTERIOVENOUS (AV) GORE-TEX GRAFT LEFT THIGH;  Surgeon: BSerafina Mitchell MD;  [place]  Service: Vascular;  Laterality: Left;  ? [place] 02/17[place]2016  ? Procedure: REMOVAL OF TWO ARTERIOVENOUS GORETEX GRAFTS ([place];  Surgeon: CAngelia Mould MD;  [place]  Service: Vascular;  Laterality: Right;  ? BIOPSY  05/16/2021  ? Procedure: BIOPSY;  Surgeon: SWilford Corner MD;  [facility];  Service: Endoscopy;;  ? BREAST BIOPSY Left 2013  ? stereo   ? BREAST BIOPSY Right 2011  ? stereo   ? CARDIAC VALVE REPLACEMENT    ? CHOLECYSTECTOMY  N/A 01/30/2017  ? Procedure: LAPAROSCOPIC CHOLECYSTECTOMY;  Surgeon: Erroll Luna, MD;  Location: [place];  Service: General;  Laterality: N/A;  ? COLONOSCOPY W/ POLYPECTOMY    ? COLONOSCOPY WITH PROPOFOL N/A 05/16/2021  ? Procedure: COLONOSCOPY WITH PROPOFOL;  Surgeon: Wilford Corner, MD;  [facility];  Service: Endoscopy;  Laterality: N/A;  ? DG AV DIALYSIS GRAFT DECLOT OR    ? DILATATION & CURETTAGE/HYSTEROSCOPY WITH TRUECLEAR N/A 11/06/2012  ? Procedure: DILATATION & CURETTAGE/HYSTEROSCOPY WITH TRUECLEAR;  Surgeon: Terrance Mass, MD;  Location: [place] [facility];  Service: Gynecology;  Laterality: N/A;  Truclear Resectoscopic  Polypectomy ?  ? INSERTION OF DIALYSIS CATHETER N/A 02/19/2016  ? Procedure: INSERTION OF Left Internal Jugular DIALYSIS CATHETER;  Surgeon: Angelia Mould, MD;  Location: [place];  Service: Vascular;  Laterality: N/A;  ? LOOP RECORDER INSERTION N/A 10/01/2016  ? Procedure: LOOP RECORDER INSERTION;  Surgeon: Constance Haw, MD;  Location: [place] [facility];  Service: Cardiovascular;  Laterality: N/A;  ? PATCH ANGIOPLASTY Right 02/17/2016  ? Procedure: PATCH ANGIOPLASTY;  Surgeon: Angelia Mould, MD;  Location: [place];  Service: Vascular;  Laterality: Right;  ? PERIPHERAL VASCULAR CATHETERIZATION N/A 09/14/2014  ? Procedure: A/V Shuntogram/Fistulagram;  Surgeon: Katha Cabal, MD;  Location: [place] [facility];  Service: Cardiovascular;  Laterality: N/A;  ? PERIPHERAL VASCULAR CATHETERIZATION N/A 09/14/2014  ? Procedure: A/V Shunt Intervention;  Surgeon: Katha Cabal, MD;  Location: [place] [facility];  Service: Cardiovascular;  Laterality: N/A;  ? PERIPHERAL VASCULAR CATHETERIZATION Right 12/09/2014  ? Procedure: A/V Shuntogram/Fistulagram;  Surgeon: Algernon Huxley, MD;  Location: [place] [facility];  Service: Cardiovascular;  Laterality: Right;  ? PERIPHERAL VASCULAR CATHETERIZATION N/A 12/09/2014  ? Procedure: A/V Shunt Intervention;  Surgeon: Algernon Huxley, MD;  Location: [place] [facility];  Service: Cardiovascular;  Laterality: N/A;  ? PERIPHERAL VASCULAR CATHETERIZATION Right 05/24/2015  ? Procedure: A/V Shuntogram;  Surgeon: Serafina Mitchell, MD;  Location: [place] [facility];  Service: Cardiovascular;  Laterality: Right;  ? PERIPHERAL VASCULAR CATHETERIZATION Right 05/24/2015  ? Procedure: Peripheral Vascular Balloon Angioplasty;  Surgeon: Serafina Mitchell, MD;  Location: [place] [facility];  Service: Cardiovascular;  Laterality: Right;  right arm shunt  ? PERIPHERAL VASCULAR CATHETERIZATION N/A 06/13/2015  ? Procedure: A/V Shuntogram/Fistulagram;  Surgeon: Algernon Huxley, MD;  Location:  [place] [facility];  Service: Cardiovascular;  Laterality: N/A;  ? PERIPHERAL VASCULAR CATHETERIZATION N/A 06/13/2015  ? Procedure: A/V Shunt Intervention;  Surgeon: Algernon Huxley, MD;  Location: [place] [facility];  Service: Cardiovascular;  Laterality: N/A;  ? TEE WITHOUT CARDIOVERSION N/A 02/22/2016  ? Procedure: TRANSESOPHAGEAL ECHOCARDIOGRAM (TEE);  Surgeon: Pixie Casino, MD;  Location: [place];  Service: Cardiovascular;  Laterality: N/A;  ? TEE WITHOUT CARDIOVERSION N/A 02/25/2016  ? Procedure: TRANSESOPHAGEAL ECHOCARDIOGRAM (TEE);  Surgeon: Melrose Nakayama, MD;  Location: [place];  Service: Open Heart Surgery;  Laterality: N/A;  ? TEE WITHOUT CARDIOVERSION N/A 10/01/2016  ? Procedure: TRANSESOPHAGEAL ECHOCARDIOGRAM (TEE);  Surgeon: Pixie Casino, MD;  Location: [place];  Service: Cardiovascular;  Laterality: N/A;  ? [place]  ? ?Patient Active Problem List  ? Diagnosis Date Noted  ? Personal history of colonic polyps 05/16/2021  ? Tooth, broken 12/18/2018  ? Hypotension 10/03/2018  ? Orthostatic hypotension 03/14/2018  ? S/P cholecystectomy 02/14/2017  ? Osteoporosis 12/11/2016  ? Cholelithiasis 11/07/2016  ? Cerebral embolism with transient ischemic attack (TIA)   ?  AVD (aortic valve disease) 07/12/2016  ? Pulmonary HTN ([place]) 07/12/2016  ? Carpal tunnel syndrome 07/05/2016  ? Disorder of both mastoids 05/29/2016  ? CVA (cerebral vascular accident) ([place]) 05/16/2016  ? History of aortic valve replacement with bioprosthetic valve 05/08/2016  ? Bacteremia due to coagulase-negative Staphylococcus   ? History of endocarditis   ? Anemia   ? Complex endometrial hyperplasia with atypia 11/08/2015  ? Dependence on renal dialysis ([place]) 12/15/2014  ? Obesity 10/28/2014  ? Right carotid bruit 01/08/2013  ? Generalized headaches 01/08/2013  ? Chronic female pelvic pain 08/08/2012  ? Gout, unspecified 12/06/2008  ? Esophageal reflux 12/06/2008  ? Backache 12/06/2008  ? FIBROIDS, UTERUS  11/24/2008  ? ESRD (end stage renal disease) on dialysis ([place]) 07/01/2006  ? Anemia in chronic kidney disease 12/18/2001  ? ? ?REFERRING DIAG: M54.50,G89.29 (ICD-10-CM) - Chronic bilateral low back pain without sciat

## 2021-05-19 DIAGNOSIS — D631 Anemia in chronic kidney disease: Secondary | ICD-10-CM | POA: Diagnosis not present

## 2021-05-19 DIAGNOSIS — Z992 Dependence on renal dialysis: Secondary | ICD-10-CM | POA: Diagnosis not present

## 2021-05-19 DIAGNOSIS — N2581 Secondary hyperparathyroidism of renal origin: Secondary | ICD-10-CM | POA: Diagnosis not present

## 2021-05-19 DIAGNOSIS — D509 Iron deficiency anemia, unspecified: Secondary | ICD-10-CM | POA: Diagnosis not present

## 2021-05-19 DIAGNOSIS — N186 End stage renal disease: Secondary | ICD-10-CM | POA: Diagnosis not present

## 2021-05-22 DIAGNOSIS — Z992 Dependence on renal dialysis: Secondary | ICD-10-CM | POA: Diagnosis not present

## 2021-05-22 DIAGNOSIS — D631 Anemia in chronic kidney disease: Secondary | ICD-10-CM | POA: Diagnosis not present

## 2021-05-22 DIAGNOSIS — D509 Iron deficiency anemia, unspecified: Secondary | ICD-10-CM | POA: Diagnosis not present

## 2021-05-22 DIAGNOSIS — N2581 Secondary hyperparathyroidism of renal origin: Secondary | ICD-10-CM | POA: Diagnosis not present

## 2021-05-22 DIAGNOSIS — N186 End stage renal disease: Secondary | ICD-10-CM | POA: Diagnosis not present

## 2021-05-24 DIAGNOSIS — N2581 Secondary hyperparathyroidism of renal origin: Secondary | ICD-10-CM | POA: Diagnosis not present

## 2021-05-24 DIAGNOSIS — Z992 Dependence on renal dialysis: Secondary | ICD-10-CM | POA: Diagnosis not present

## 2021-05-24 DIAGNOSIS — D509 Iron deficiency anemia, unspecified: Secondary | ICD-10-CM | POA: Diagnosis not present

## 2021-05-24 DIAGNOSIS — N186 End stage renal disease: Secondary | ICD-10-CM | POA: Diagnosis not present

## 2021-05-24 DIAGNOSIS — D631 Anemia in chronic kidney disease: Secondary | ICD-10-CM | POA: Diagnosis not present

## 2021-05-25 DIAGNOSIS — N186 End stage renal disease: Secondary | ICD-10-CM | POA: Diagnosis not present

## 2021-05-25 DIAGNOSIS — I871 Compression of vein: Secondary | ICD-10-CM | POA: Diagnosis not present

## 2021-05-25 DIAGNOSIS — T82858A Stenosis of vascular prosthetic devices, implants and grafts, initial encounter: Secondary | ICD-10-CM | POA: Diagnosis not present

## 2021-05-25 DIAGNOSIS — Z992 Dependence on renal dialysis: Secondary | ICD-10-CM | POA: Diagnosis not present

## 2021-05-26 DIAGNOSIS — D631 Anemia in chronic kidney disease: Secondary | ICD-10-CM | POA: Diagnosis not present

## 2021-05-26 DIAGNOSIS — N186 End stage renal disease: Secondary | ICD-10-CM | POA: Diagnosis not present

## 2021-05-26 DIAGNOSIS — Z992 Dependence on renal dialysis: Secondary | ICD-10-CM | POA: Diagnosis not present

## 2021-05-26 DIAGNOSIS — D509 Iron deficiency anemia, unspecified: Secondary | ICD-10-CM | POA: Diagnosis not present

## 2021-05-26 DIAGNOSIS — N2581 Secondary hyperparathyroidism of renal origin: Secondary | ICD-10-CM | POA: Diagnosis not present

## 2021-05-27 DIAGNOSIS — N039 Chronic nephritic syndrome with unspecified morphologic changes: Secondary | ICD-10-CM | POA: Diagnosis not present

## 2021-05-27 DIAGNOSIS — N186 End stage renal disease: Secondary | ICD-10-CM | POA: Diagnosis not present

## 2021-05-27 DIAGNOSIS — Z992 Dependence on renal dialysis: Secondary | ICD-10-CM | POA: Diagnosis not present

## 2021-05-29 DIAGNOSIS — D509 Iron deficiency anemia, unspecified: Secondary | ICD-10-CM | POA: Diagnosis not present

## 2021-05-29 DIAGNOSIS — Z992 Dependence on renal dialysis: Secondary | ICD-10-CM | POA: Diagnosis not present

## 2021-05-29 DIAGNOSIS — D631 Anemia in chronic kidney disease: Secondary | ICD-10-CM | POA: Diagnosis not present

## 2021-05-29 DIAGNOSIS — N186 End stage renal disease: Secondary | ICD-10-CM | POA: Diagnosis not present

## 2021-05-29 DIAGNOSIS — N2581 Secondary hyperparathyroidism of renal origin: Secondary | ICD-10-CM | POA: Diagnosis not present

## 2021-05-31 DIAGNOSIS — N186 End stage renal disease: Secondary | ICD-10-CM | POA: Diagnosis not present

## 2021-05-31 DIAGNOSIS — N2581 Secondary hyperparathyroidism of renal origin: Secondary | ICD-10-CM | POA: Diagnosis not present

## 2021-05-31 DIAGNOSIS — D631 Anemia in chronic kidney disease: Secondary | ICD-10-CM | POA: Diagnosis not present

## 2021-05-31 DIAGNOSIS — Z992 Dependence on renal dialysis: Secondary | ICD-10-CM | POA: Diagnosis not present

## 2021-05-31 DIAGNOSIS — D509 Iron deficiency anemia, unspecified: Secondary | ICD-10-CM | POA: Diagnosis not present

## 2021-06-02 DIAGNOSIS — N186 End stage renal disease: Secondary | ICD-10-CM | POA: Diagnosis not present

## 2021-06-02 DIAGNOSIS — Z992 Dependence on renal dialysis: Secondary | ICD-10-CM | POA: Diagnosis not present

## 2021-06-02 DIAGNOSIS — D631 Anemia in chronic kidney disease: Secondary | ICD-10-CM | POA: Diagnosis not present

## 2021-06-02 DIAGNOSIS — D509 Iron deficiency anemia, unspecified: Secondary | ICD-10-CM | POA: Diagnosis not present

## 2021-06-02 DIAGNOSIS — N2581 Secondary hyperparathyroidism of renal origin: Secondary | ICD-10-CM | POA: Diagnosis not present

## 2021-06-05 DIAGNOSIS — N186 End stage renal disease: Secondary | ICD-10-CM | POA: Diagnosis not present

## 2021-06-05 DIAGNOSIS — Z992 Dependence on renal dialysis: Secondary | ICD-10-CM | POA: Diagnosis not present

## 2021-06-05 DIAGNOSIS — N2581 Secondary hyperparathyroidism of renal origin: Secondary | ICD-10-CM | POA: Diagnosis not present

## 2021-06-05 DIAGNOSIS — D509 Iron deficiency anemia, unspecified: Secondary | ICD-10-CM | POA: Diagnosis not present

## 2021-06-05 DIAGNOSIS — D631 Anemia in chronic kidney disease: Secondary | ICD-10-CM | POA: Diagnosis not present

## 2021-06-07 DIAGNOSIS — Z992 Dependence on renal dialysis: Secondary | ICD-10-CM | POA: Diagnosis not present

## 2021-06-07 DIAGNOSIS — N2581 Secondary hyperparathyroidism of renal origin: Secondary | ICD-10-CM | POA: Diagnosis not present

## 2021-06-07 DIAGNOSIS — D631 Anemia in chronic kidney disease: Secondary | ICD-10-CM | POA: Diagnosis not present

## 2021-06-07 DIAGNOSIS — D509 Iron deficiency anemia, unspecified: Secondary | ICD-10-CM | POA: Diagnosis not present

## 2021-06-07 DIAGNOSIS — N186 End stage renal disease: Secondary | ICD-10-CM | POA: Diagnosis not present

## 2021-06-09 DIAGNOSIS — Z992 Dependence on renal dialysis: Secondary | ICD-10-CM | POA: Diagnosis not present

## 2021-06-09 DIAGNOSIS — D509 Iron deficiency anemia, unspecified: Secondary | ICD-10-CM | POA: Diagnosis not present

## 2021-06-09 DIAGNOSIS — D631 Anemia in chronic kidney disease: Secondary | ICD-10-CM | POA: Diagnosis not present

## 2021-06-09 DIAGNOSIS — N186 End stage renal disease: Secondary | ICD-10-CM | POA: Diagnosis not present

## 2021-06-09 DIAGNOSIS — N2581 Secondary hyperparathyroidism of renal origin: Secondary | ICD-10-CM | POA: Diagnosis not present

## 2021-06-12 DIAGNOSIS — D509 Iron deficiency anemia, unspecified: Secondary | ICD-10-CM | POA: Diagnosis not present

## 2021-06-12 DIAGNOSIS — N2581 Secondary hyperparathyroidism of renal origin: Secondary | ICD-10-CM | POA: Diagnosis not present

## 2021-06-12 DIAGNOSIS — D631 Anemia in chronic kidney disease: Secondary | ICD-10-CM | POA: Diagnosis not present

## 2021-06-12 DIAGNOSIS — N186 End stage renal disease: Secondary | ICD-10-CM | POA: Diagnosis not present

## 2021-06-12 DIAGNOSIS — Z992 Dependence on renal dialysis: Secondary | ICD-10-CM | POA: Diagnosis not present

## 2021-06-14 DIAGNOSIS — D509 Iron deficiency anemia, unspecified: Secondary | ICD-10-CM | POA: Diagnosis not present

## 2021-06-14 DIAGNOSIS — N186 End stage renal disease: Secondary | ICD-10-CM | POA: Diagnosis not present

## 2021-06-14 DIAGNOSIS — N2581 Secondary hyperparathyroidism of renal origin: Secondary | ICD-10-CM | POA: Diagnosis not present

## 2021-06-14 DIAGNOSIS — Z992 Dependence on renal dialysis: Secondary | ICD-10-CM | POA: Diagnosis not present

## 2021-06-14 DIAGNOSIS — D631 Anemia in chronic kidney disease: Secondary | ICD-10-CM | POA: Diagnosis not present

## 2021-06-16 DIAGNOSIS — N186 End stage renal disease: Secondary | ICD-10-CM | POA: Diagnosis not present

## 2021-06-16 DIAGNOSIS — N2581 Secondary hyperparathyroidism of renal origin: Secondary | ICD-10-CM | POA: Diagnosis not present

## 2021-06-16 DIAGNOSIS — Z992 Dependence on renal dialysis: Secondary | ICD-10-CM | POA: Diagnosis not present

## 2021-06-16 DIAGNOSIS — D631 Anemia in chronic kidney disease: Secondary | ICD-10-CM | POA: Diagnosis not present

## 2021-06-16 DIAGNOSIS — D509 Iron deficiency anemia, unspecified: Secondary | ICD-10-CM | POA: Diagnosis not present

## 2021-06-19 DIAGNOSIS — D631 Anemia in chronic kidney disease: Secondary | ICD-10-CM | POA: Diagnosis not present

## 2021-06-19 DIAGNOSIS — N2581 Secondary hyperparathyroidism of renal origin: Secondary | ICD-10-CM | POA: Diagnosis not present

## 2021-06-19 DIAGNOSIS — D509 Iron deficiency anemia, unspecified: Secondary | ICD-10-CM | POA: Diagnosis not present

## 2021-06-19 DIAGNOSIS — N186 End stage renal disease: Secondary | ICD-10-CM | POA: Diagnosis not present

## 2021-06-19 DIAGNOSIS — Z992 Dependence on renal dialysis: Secondary | ICD-10-CM | POA: Diagnosis not present

## 2021-06-21 DIAGNOSIS — N186 End stage renal disease: Secondary | ICD-10-CM | POA: Diagnosis not present

## 2021-06-21 DIAGNOSIS — D509 Iron deficiency anemia, unspecified: Secondary | ICD-10-CM | POA: Diagnosis not present

## 2021-06-21 DIAGNOSIS — Z992 Dependence on renal dialysis: Secondary | ICD-10-CM | POA: Diagnosis not present

## 2021-06-21 DIAGNOSIS — D631 Anemia in chronic kidney disease: Secondary | ICD-10-CM | POA: Diagnosis not present

## 2021-06-21 DIAGNOSIS — N2581 Secondary hyperparathyroidism of renal origin: Secondary | ICD-10-CM | POA: Diagnosis not present

## 2021-06-23 DIAGNOSIS — D631 Anemia in chronic kidney disease: Secondary | ICD-10-CM | POA: Diagnosis not present

## 2021-06-23 DIAGNOSIS — Z992 Dependence on renal dialysis: Secondary | ICD-10-CM | POA: Diagnosis not present

## 2021-06-23 DIAGNOSIS — N2581 Secondary hyperparathyroidism of renal origin: Secondary | ICD-10-CM | POA: Diagnosis not present

## 2021-06-23 DIAGNOSIS — N186 End stage renal disease: Secondary | ICD-10-CM | POA: Diagnosis not present

## 2021-06-23 DIAGNOSIS — D509 Iron deficiency anemia, unspecified: Secondary | ICD-10-CM | POA: Diagnosis not present

## 2021-06-26 DIAGNOSIS — D509 Iron deficiency anemia, unspecified: Secondary | ICD-10-CM | POA: Diagnosis not present

## 2021-06-26 DIAGNOSIS — N186 End stage renal disease: Secondary | ICD-10-CM | POA: Diagnosis not present

## 2021-06-26 DIAGNOSIS — D631 Anemia in chronic kidney disease: Secondary | ICD-10-CM | POA: Diagnosis not present

## 2021-06-26 DIAGNOSIS — Z992 Dependence on renal dialysis: Secondary | ICD-10-CM | POA: Diagnosis not present

## 2021-06-26 DIAGNOSIS — N2581 Secondary hyperparathyroidism of renal origin: Secondary | ICD-10-CM | POA: Diagnosis not present

## 2021-06-26 DIAGNOSIS — N039 Chronic nephritic syndrome with unspecified morphologic changes: Secondary | ICD-10-CM | POA: Diagnosis not present

## 2021-06-28 DIAGNOSIS — N2581 Secondary hyperparathyroidism of renal origin: Secondary | ICD-10-CM | POA: Diagnosis not present

## 2021-06-28 DIAGNOSIS — Z992 Dependence on renal dialysis: Secondary | ICD-10-CM | POA: Diagnosis not present

## 2021-06-28 DIAGNOSIS — N186 End stage renal disease: Secondary | ICD-10-CM | POA: Diagnosis not present

## 2021-06-28 DIAGNOSIS — D631 Anemia in chronic kidney disease: Secondary | ICD-10-CM | POA: Diagnosis not present

## 2021-06-28 DIAGNOSIS — D509 Iron deficiency anemia, unspecified: Secondary | ICD-10-CM | POA: Diagnosis not present

## 2021-06-30 DIAGNOSIS — N2581 Secondary hyperparathyroidism of renal origin: Secondary | ICD-10-CM | POA: Diagnosis not present

## 2021-06-30 DIAGNOSIS — Z992 Dependence on renal dialysis: Secondary | ICD-10-CM | POA: Diagnosis not present

## 2021-06-30 DIAGNOSIS — D509 Iron deficiency anemia, unspecified: Secondary | ICD-10-CM | POA: Diagnosis not present

## 2021-06-30 DIAGNOSIS — N186 End stage renal disease: Secondary | ICD-10-CM | POA: Diagnosis not present

## 2021-06-30 DIAGNOSIS — D631 Anemia in chronic kidney disease: Secondary | ICD-10-CM | POA: Diagnosis not present

## 2021-07-03 DIAGNOSIS — N2581 Secondary hyperparathyroidism of renal origin: Secondary | ICD-10-CM | POA: Diagnosis not present

## 2021-07-03 DIAGNOSIS — D509 Iron deficiency anemia, unspecified: Secondary | ICD-10-CM | POA: Diagnosis not present

## 2021-07-03 DIAGNOSIS — Z992 Dependence on renal dialysis: Secondary | ICD-10-CM | POA: Diagnosis not present

## 2021-07-03 DIAGNOSIS — N186 End stage renal disease: Secondary | ICD-10-CM | POA: Diagnosis not present

## 2021-07-03 DIAGNOSIS — D631 Anemia in chronic kidney disease: Secondary | ICD-10-CM | POA: Diagnosis not present

## 2021-07-05 DIAGNOSIS — Z992 Dependence on renal dialysis: Secondary | ICD-10-CM | POA: Diagnosis not present

## 2021-07-05 DIAGNOSIS — D631 Anemia in chronic kidney disease: Secondary | ICD-10-CM | POA: Diagnosis not present

## 2021-07-05 DIAGNOSIS — D509 Iron deficiency anemia, unspecified: Secondary | ICD-10-CM | POA: Diagnosis not present

## 2021-07-05 DIAGNOSIS — N186 End stage renal disease: Secondary | ICD-10-CM | POA: Diagnosis not present

## 2021-07-05 DIAGNOSIS — N2581 Secondary hyperparathyroidism of renal origin: Secondary | ICD-10-CM | POA: Diagnosis not present

## 2021-07-07 DIAGNOSIS — D631 Anemia in chronic kidney disease: Secondary | ICD-10-CM | POA: Diagnosis not present

## 2021-07-07 DIAGNOSIS — N2581 Secondary hyperparathyroidism of renal origin: Secondary | ICD-10-CM | POA: Diagnosis not present

## 2021-07-07 DIAGNOSIS — N186 End stage renal disease: Secondary | ICD-10-CM | POA: Diagnosis not present

## 2021-07-07 DIAGNOSIS — D509 Iron deficiency anemia, unspecified: Secondary | ICD-10-CM | POA: Diagnosis not present

## 2021-07-07 DIAGNOSIS — Z992 Dependence on renal dialysis: Secondary | ICD-10-CM | POA: Diagnosis not present

## 2021-07-10 DIAGNOSIS — N2581 Secondary hyperparathyroidism of renal origin: Secondary | ICD-10-CM | POA: Diagnosis not present

## 2021-07-10 DIAGNOSIS — N186 End stage renal disease: Secondary | ICD-10-CM | POA: Diagnosis not present

## 2021-07-10 DIAGNOSIS — D509 Iron deficiency anemia, unspecified: Secondary | ICD-10-CM | POA: Diagnosis not present

## 2021-07-10 DIAGNOSIS — D631 Anemia in chronic kidney disease: Secondary | ICD-10-CM | POA: Diagnosis not present

## 2021-07-10 DIAGNOSIS — Z992 Dependence on renal dialysis: Secondary | ICD-10-CM | POA: Diagnosis not present

## 2021-07-12 DIAGNOSIS — N186 End stage renal disease: Secondary | ICD-10-CM | POA: Diagnosis not present

## 2021-07-12 DIAGNOSIS — D631 Anemia in chronic kidney disease: Secondary | ICD-10-CM | POA: Diagnosis not present

## 2021-07-12 DIAGNOSIS — N2581 Secondary hyperparathyroidism of renal origin: Secondary | ICD-10-CM | POA: Diagnosis not present

## 2021-07-12 DIAGNOSIS — D509 Iron deficiency anemia, unspecified: Secondary | ICD-10-CM | POA: Diagnosis not present

## 2021-07-12 DIAGNOSIS — Z992 Dependence on renal dialysis: Secondary | ICD-10-CM | POA: Diagnosis not present

## 2021-07-14 DIAGNOSIS — D509 Iron deficiency anemia, unspecified: Secondary | ICD-10-CM | POA: Diagnosis not present

## 2021-07-14 DIAGNOSIS — N2581 Secondary hyperparathyroidism of renal origin: Secondary | ICD-10-CM | POA: Diagnosis not present

## 2021-07-14 DIAGNOSIS — Z992 Dependence on renal dialysis: Secondary | ICD-10-CM | POA: Diagnosis not present

## 2021-07-14 DIAGNOSIS — N186 End stage renal disease: Secondary | ICD-10-CM | POA: Diagnosis not present

## 2021-07-14 DIAGNOSIS — D631 Anemia in chronic kidney disease: Secondary | ICD-10-CM | POA: Diagnosis not present

## 2021-07-17 DIAGNOSIS — Z992 Dependence on renal dialysis: Secondary | ICD-10-CM | POA: Diagnosis not present

## 2021-07-17 DIAGNOSIS — D509 Iron deficiency anemia, unspecified: Secondary | ICD-10-CM | POA: Diagnosis not present

## 2021-07-17 DIAGNOSIS — N186 End stage renal disease: Secondary | ICD-10-CM | POA: Diagnosis not present

## 2021-07-17 DIAGNOSIS — N2581 Secondary hyperparathyroidism of renal origin: Secondary | ICD-10-CM | POA: Diagnosis not present

## 2021-07-17 DIAGNOSIS — D631 Anemia in chronic kidney disease: Secondary | ICD-10-CM | POA: Diagnosis not present

## 2021-07-19 DIAGNOSIS — D631 Anemia in chronic kidney disease: Secondary | ICD-10-CM | POA: Diagnosis not present

## 2021-07-19 DIAGNOSIS — N186 End stage renal disease: Secondary | ICD-10-CM | POA: Diagnosis not present

## 2021-07-19 DIAGNOSIS — Z992 Dependence on renal dialysis: Secondary | ICD-10-CM | POA: Diagnosis not present

## 2021-07-19 DIAGNOSIS — D509 Iron deficiency anemia, unspecified: Secondary | ICD-10-CM | POA: Diagnosis not present

## 2021-07-19 DIAGNOSIS — N2581 Secondary hyperparathyroidism of renal origin: Secondary | ICD-10-CM | POA: Diagnosis not present

## 2021-07-21 DIAGNOSIS — D631 Anemia in chronic kidney disease: Secondary | ICD-10-CM | POA: Diagnosis not present

## 2021-07-21 DIAGNOSIS — N186 End stage renal disease: Secondary | ICD-10-CM | POA: Diagnosis not present

## 2021-07-21 DIAGNOSIS — D509 Iron deficiency anemia, unspecified: Secondary | ICD-10-CM | POA: Diagnosis not present

## 2021-07-21 DIAGNOSIS — N2581 Secondary hyperparathyroidism of renal origin: Secondary | ICD-10-CM | POA: Diagnosis not present

## 2021-07-21 DIAGNOSIS — Z992 Dependence on renal dialysis: Secondary | ICD-10-CM | POA: Diagnosis not present

## 2021-07-24 DIAGNOSIS — N2581 Secondary hyperparathyroidism of renal origin: Secondary | ICD-10-CM | POA: Diagnosis not present

## 2021-07-24 DIAGNOSIS — Z992 Dependence on renal dialysis: Secondary | ICD-10-CM | POA: Diagnosis not present

## 2021-07-24 DIAGNOSIS — N186 End stage renal disease: Secondary | ICD-10-CM | POA: Diagnosis not present

## 2021-07-24 DIAGNOSIS — D509 Iron deficiency anemia, unspecified: Secondary | ICD-10-CM | POA: Diagnosis not present

## 2021-07-24 DIAGNOSIS — D631 Anemia in chronic kidney disease: Secondary | ICD-10-CM | POA: Diagnosis not present

## 2021-07-26 DIAGNOSIS — N186 End stage renal disease: Secondary | ICD-10-CM | POA: Diagnosis not present

## 2021-07-26 DIAGNOSIS — D631 Anemia in chronic kidney disease: Secondary | ICD-10-CM | POA: Diagnosis not present

## 2021-07-26 DIAGNOSIS — N2581 Secondary hyperparathyroidism of renal origin: Secondary | ICD-10-CM | POA: Diagnosis not present

## 2021-07-26 DIAGNOSIS — D509 Iron deficiency anemia, unspecified: Secondary | ICD-10-CM | POA: Diagnosis not present

## 2021-07-26 DIAGNOSIS — Z992 Dependence on renal dialysis: Secondary | ICD-10-CM | POA: Diagnosis not present

## 2021-07-27 DIAGNOSIS — N186 End stage renal disease: Secondary | ICD-10-CM | POA: Diagnosis not present

## 2021-07-27 DIAGNOSIS — N039 Chronic nephritic syndrome with unspecified morphologic changes: Secondary | ICD-10-CM | POA: Diagnosis not present

## 2021-07-27 DIAGNOSIS — Z992 Dependence on renal dialysis: Secondary | ICD-10-CM | POA: Diagnosis not present

## 2021-07-28 DIAGNOSIS — D631 Anemia in chronic kidney disease: Secondary | ICD-10-CM | POA: Diagnosis not present

## 2021-07-28 DIAGNOSIS — N2581 Secondary hyperparathyroidism of renal origin: Secondary | ICD-10-CM | POA: Diagnosis not present

## 2021-07-28 DIAGNOSIS — Z992 Dependence on renal dialysis: Secondary | ICD-10-CM | POA: Diagnosis not present

## 2021-07-28 DIAGNOSIS — N186 End stage renal disease: Secondary | ICD-10-CM | POA: Diagnosis not present

## 2021-07-28 DIAGNOSIS — D509 Iron deficiency anemia, unspecified: Secondary | ICD-10-CM | POA: Diagnosis not present

## 2021-07-31 DIAGNOSIS — D631 Anemia in chronic kidney disease: Secondary | ICD-10-CM | POA: Diagnosis not present

## 2021-07-31 DIAGNOSIS — N2581 Secondary hyperparathyroidism of renal origin: Secondary | ICD-10-CM | POA: Diagnosis not present

## 2021-07-31 DIAGNOSIS — N186 End stage renal disease: Secondary | ICD-10-CM | POA: Diagnosis not present

## 2021-07-31 DIAGNOSIS — Z992 Dependence on renal dialysis: Secondary | ICD-10-CM | POA: Diagnosis not present

## 2021-07-31 DIAGNOSIS — D509 Iron deficiency anemia, unspecified: Secondary | ICD-10-CM | POA: Diagnosis not present

## 2021-08-01 ENCOUNTER — Encounter: Payer: Self-pay | Admitting: *Deleted

## 2021-08-02 DIAGNOSIS — N2581 Secondary hyperparathyroidism of renal origin: Secondary | ICD-10-CM | POA: Diagnosis not present

## 2021-08-02 DIAGNOSIS — Z992 Dependence on renal dialysis: Secondary | ICD-10-CM | POA: Diagnosis not present

## 2021-08-02 DIAGNOSIS — N186 End stage renal disease: Secondary | ICD-10-CM | POA: Diagnosis not present

## 2021-08-02 DIAGNOSIS — D631 Anemia in chronic kidney disease: Secondary | ICD-10-CM | POA: Diagnosis not present

## 2021-08-02 DIAGNOSIS — D509 Iron deficiency anemia, unspecified: Secondary | ICD-10-CM | POA: Diagnosis not present

## 2021-08-04 DIAGNOSIS — D631 Anemia in chronic kidney disease: Secondary | ICD-10-CM | POA: Diagnosis not present

## 2021-08-04 DIAGNOSIS — N2581 Secondary hyperparathyroidism of renal origin: Secondary | ICD-10-CM | POA: Diagnosis not present

## 2021-08-04 DIAGNOSIS — D509 Iron deficiency anemia, unspecified: Secondary | ICD-10-CM | POA: Diagnosis not present

## 2021-08-04 DIAGNOSIS — Z992 Dependence on renal dialysis: Secondary | ICD-10-CM | POA: Diagnosis not present

## 2021-08-04 DIAGNOSIS — N186 End stage renal disease: Secondary | ICD-10-CM | POA: Diagnosis not present

## 2021-08-07 DIAGNOSIS — N186 End stage renal disease: Secondary | ICD-10-CM | POA: Diagnosis not present

## 2021-08-07 DIAGNOSIS — D509 Iron deficiency anemia, unspecified: Secondary | ICD-10-CM | POA: Diagnosis not present

## 2021-08-07 DIAGNOSIS — D631 Anemia in chronic kidney disease: Secondary | ICD-10-CM | POA: Diagnosis not present

## 2021-08-07 DIAGNOSIS — Z992 Dependence on renal dialysis: Secondary | ICD-10-CM | POA: Diagnosis not present

## 2021-08-07 DIAGNOSIS — N2581 Secondary hyperparathyroidism of renal origin: Secondary | ICD-10-CM | POA: Diagnosis not present

## 2021-08-08 ENCOUNTER — Ambulatory Visit (HOSPITAL_COMMUNITY)
Admission: EM | Admit: 2021-08-08 | Discharge: 2021-08-08 | Disposition: A | Discharge status: Home / Self Care | Payer: Medicare Other | Attending: Physician Assistant | Admitting: Physician Assistant

## 2021-08-08 ENCOUNTER — Encounter (HOSPITAL_COMMUNITY): Payer: Self-pay | Admitting: Emergency Medicine

## 2021-08-08 ENCOUNTER — Other Ambulatory Visit: Payer: Self-pay

## 2021-08-08 DIAGNOSIS — R5381 Other malaise: Secondary | ICD-10-CM | POA: Diagnosis not present

## 2021-08-08 DIAGNOSIS — R5383 Other fatigue: Secondary | ICD-10-CM | POA: Insufficient documentation

## 2021-08-08 DIAGNOSIS — R52 Pain, unspecified: Secondary | ICD-10-CM | POA: Diagnosis not present

## 2021-08-08 LAB — COMPREHENSIVE METABOLIC PANEL
ALT: 11 U/L (ref 0–44)
AST: 12 U/L — ABNORMAL LOW (ref 15–41)
Albumin: 3.7 g/dL (ref 3.5–5.0)
Alkaline Phosphatase: 174 U/L — ABNORMAL HIGH (ref 38–126)
Anion gap: 15 (ref 5–15)
BUN: 18 mg/dL (ref 8–23)
CO2: 31 mmol/L (ref 22–32)
Calcium: 10.1 mg/dL (ref 8.9–10.3)
Chloride: 96 mmol/L — ABNORMAL LOW (ref 98–111)
Creatinine, Ser: 7.08 mg/dL — ABNORMAL HIGH (ref 0.44–1.00)
GFR, Estimated: 6 mL/min — ABNORMAL LOW (ref >60)
Glucose, Bld: 98 mg/dL (ref 70–99)
Potassium: 3.6 mmol/L (ref 3.5–5.1)
Sodium: 142 mmol/L (ref 135–145)
Total Bilirubin: 0.7 mg/dL (ref 0.3–1.2)
Total Protein: 7.1 g/dL (ref 6.5–8.1)

## 2021-08-08 LAB — CBC WITH DIFFERENTIAL/PLATELET
Abs Immature Granulocytes: 0.02 10*3/uL (ref 0.00–0.07)
Basophils Absolute: 0 10*3/uL (ref 0.0–0.1)
Basophils Relative: 1 %
Eosinophils Absolute: 0.3 10*3/uL (ref 0.0–0.5)
Eosinophils Relative: 6 %
HCT: 36.6 % (ref 36.0–46.0)
Hemoglobin: 11.7 g/dL — ABNORMAL LOW (ref 12.0–15.0)
Immature Granulocytes: 0 %
Lymphocytes Relative: 28 %
Lymphs Abs: 1.5 10*3/uL (ref 0.7–4.0)
MCH: 29.5 pg (ref 26.0–34.0)
MCHC: 32 g/dL (ref 30.0–36.0)
MCV: 92.4 fL (ref 80.0–100.0)
Monocytes Absolute: 0.3 10*3/uL (ref 0.1–1.0)
Monocytes Relative: 6 %
Neutro Abs: 3.1 10*3/uL (ref 1.7–7.7)
Neutrophils Relative %: 59 %
Platelets: 245 10*3/uL (ref 150–400)
RBC: 3.96 MIL/uL (ref 3.87–5.11)
RDW: 14.5 % (ref 11.5–15.5)
WBC: 5.2 10*3/uL (ref 4.0–10.5)
nRBC: 0 % (ref 0.0–0.2)

## 2021-08-08 LAB — CK: Total CK: 31 U/L — ABNORMAL LOW (ref 38–234)

## 2021-08-08 NOTE — Discharge Instructions (Signed)
Your lab work is reassuring with no obvious cause of your symptoms.  It is possible that you are developing a virus and if you have any other symptoms please return so we can do some additional testing.  Make sure you follow-up with dialysis tomorrow as scheduled.  If anything worsens you need to be seen immediately.

## 2021-08-08 NOTE — ED Triage Notes (Addendum)
Complains of joint pain and swelling.  This episode started last week.  Patient has a history of the same .  Patient denies any medication changes    Patient is a dialysis patient and had dialysis yesterday

## 2021-08-08 NOTE — ED Provider Notes (Signed)
[place]    CSN: [id number] Arrival date & time: 08/08/21  1115      History   Chief Complaint Chief Complaint  Patient presents with   Joint Pain   Joint Swelling    HPI [name] Marks is a 66 y.o. female.   Patient presents today with a several day history of generalized myalgias/arthralgias.  She reports pain throughout her body which is rated 7 on a 0-10 pain scale, described as aching, no aggravating or alleviating factors notified.  She does report a few bouts of diarrhea a few days ago but this has since normalized.  She reports last bowel movement was earlier today was normal without blood or mucus.  She denies any abdominal pain, nausea, vomiting.  Denies any cough, congestion, sore throat.  Denies any known sick contacts.  She is on dialysis and has been compliant with Monday, Wednesday, Friday sessions.  She does not great urine.  She is concerned for an infection as she often has nonspecific symptoms at the beginning of illness.  She denies any recent medication changes.  Denies any dietary changes.  She reports consistent regulated fluid intake.    Past Medical History:  Diagnosis Date   Anemia    Aortic valve prosthesis present 02/25/2016   Arthritis    "knees" (01/03/2017)   AVD (aortic valve disease) 07/12/2016   Backache 12/06/2008   Bacteremia due to coagulase-negative Staphylococcus    Carpal tunnel syndrome    Cerebral embolism with transient ischemic attack (TIA)    Cholelithiases 01/28/2017   Chronic female pelvic pain 8/41/3244   Complication of anesthesia    01/01/17- '"a long time ago, difficulty breathimg, not sure if it was due to anesthesia or not."   CVA (cerebral vascular accident) ([place]) 05/16/2016   End stage renal disease on dialysis ([place])    "MWF; [place]." (01/03/2017)   Endocarditis    Esophageal reflux 12/06/2008   GERD (gastroesophageal reflux disease)    Gout    History of blood transfusion 2017   "related to blood  poison" (01/03/2017)   Increased endometrial stripe thickness 11/03/2015   Neck pain    Osteoporosis 09/2016   T score -2.6   Pain and swelling of right upper extremity 12/20/2015   Pulmonary HTN ([place]) 07/12/2016   Renal dialysis device, implant, or graft complication 02/28/7251   Renal insufficiency    S/P cholecystectomy 02/14/2017   Septic shock ([place])    Staphylococcus aureus bacteremia    TIA (transient ischemic attack)    "several" (01/03/2017)    Patient Active Problem List   Diagnosis Date Noted   Personal history of colonic polyps 05/16/2021   Tooth, broken 12/18/2018   Hypotension 10/03/2018   Orthostatic hypotension 03/14/2018   S/P cholecystectomy 02/14/2017   Osteoporosis 12/11/2016   Cholelithiasis 11/07/2016   Cerebral embolism with transient ischemic attack (TIA)    AVD (aortic valve disease) 07/12/2016   Pulmonary HTN ([place]) 07/12/2016   Carpal tunnel syndrome 07/05/2016   Disorder of both mastoids 05/29/2016   CVA (cerebral vascular accident) ([place]) 05/16/2016   History of aortic valve replacement with bioprosthetic valve 05/08/2016   Bacteremia due to coagulase-negative Staphylococcus    History of endocarditis    Anemia    Complex endometrial hyperplasia with atypia 11/08/2015   Dependence on renal dialysis ([place]) 12/15/2014   Obesity 10/28/2014   Right carotid bruit 01/08/2013   Generalized headaches 01/08/2013   Chronic female pelvic pain 08/08/2012   Gout, unspecified  12/06/2008   Esophageal reflux 12/06/2008   Backache 12/06/2008   FIBROIDS, UTERUS 11/24/2008   ESRD (end stage renal disease) on dialysis ([place]) 07/01/2006   Anemia in chronic kidney disease 12/18/2001    Past Surgical History:  Procedure Laterality Date   AORTIC VALVE REPLACEMENT N/A 02/25/2016   Procedure: AORTIC VALVE REPLACEMENT (AVR) implanted with Magna Ease Aortic valve size 27m;  Surgeon: SMelrose Nakayama MD;  [place] [facility]  Service: Open Heart Surgery;  Laterality: N/A;    AV FISTULA PLACEMENT Left 01/03/2017   Procedure: INSERTION OF ARTERIOVENOUS (AV) GORE-TEX GRAFT LEFT THIGH;  Surgeon: BSerafina Mitchell MD;  [facility]  Service: Vascular;  Laterality: Left;   AJ[place] 02/17/2016   Procedure: REMOVAL OF TWO ARTERIOVENOUS GORETEX GRAFTS ([place];  Surgeon: CAngelia Mould MD;  [facility];  Service: Vascular;  Laterality: Right;   BIOPSY  05/16/2021   Procedure: BIOPSY;  Surgeon: SWilford Corner MD;  [facility]: [facility];  Service: Endoscopy;;   BREAST BIOPSY Left 2013   stereo    BREAST BIOPSY Right 2011   stereo    CARDIAC VALVE REPLACEMENT     CHOLECYSTECTOMY N/A 01/30/2017   Procedure: LAPAROSCOPIC CHOLECYSTECTOMY;  Surgeon: CErroll Luna MD;  [facility]  Service: General;  Laterality: N/A;   COLONOSCOPY W/ POLYPECTOMY     COLONOSCOPY WITH PROPOFOL N/A 05/16/2021   Procedure: COLONOSCOPY WITH PROPOFOL;  Surgeon: SWilford Corner MD;  [facility];  Service: Endoscopy;  Laterality: N/A;   DG AV DIALYSIS GRAFT DECLOT OR     DILATATION & CURETTAGE/HYSTEROSCOPY WITH TRUECLEAR N/A 11/06/2012   Procedure: DILATATION & CURETTAGE/HYSTEROSCOPY WITH TRUECLEAR;  Surgeon: JTerrance Mass MD;  [facility];  Service: Gynecology;  Laterality: N/A;  Truclear Resectoscopic Polypectomy    INSERTION OF DIALYSIS CATHETER N/A 02/19/2016   Procedure: INSERTION OF Left Internal Jugular DIALYSIS CATHETER;  Surgeon: CAngelia Mould MD;  [place] [facility]  Service: Vascular;  Laterality: N/A;   LOOP RECORDER INSERTION N/A 10/01/2016   Procedure: LOOP RECORDER INSERTION;  Surgeon: CConstance Haw MD;  [facility];  Service: Cardiovascular;  Laterality: N/A;   PATCH ANGIOPLASTY Right 02/17/2016   Procedure: PATCH ANGIOPLASTY;  Surgeon: CAngelia Mould MD;  [facility]  Service: Vascular;  Laterality: Right;   PERIPHERAL VASCULAR CATHETERIZATION N/A 09/14/2014   Procedure: A/V  Shuntogram/Fistulagram;  Surgeon: GKatha Cabal MD;  [facility];  Service: Cardiovascular;  Laterality: N/A;   PERIPHERAL VASCULAR CATHETERIZATION N/A 09/14/2014   Procedure: A/V Shunt Intervention;  Surgeon: GKatha Cabal MD;  [facility];  Service: Cardiovascular;  Laterality: N/A;   PERIPHERAL VASCULAR CATHETERIZATION Right 12/09/2014   Procedure: A/V Shuntogram/Fistulagram;  Surgeon: JAlgernon Huxley MD;  [facility];  Service: Cardiovascular;  Laterality: Right;   PERIPHERAL VASCULAR CATHETERIZATION N/A 12/09/2014   Procedure: A/V Shunt Intervention;  Surgeon: JAlgernon Huxley MD;  [facility];  Service: Cardiovascular;  Laterality: N/A;   PERIPHERAL VASCULAR CATHETERIZATION Right 05/24/2015   Procedure: A/V Shuntogram;  Surgeon: VSerafina Mitchell MD;  [facility];  Service: Cardiovascular;  Laterality: Right;   PERIPHERAL VASCULAR CATHETERIZATION Right 05/24/2015   Procedure: Peripheral Vascular Balloon Angioplasty;  Surgeon: VSerafina Mitchell MD;  [facility];  Service: Cardiovascular;  Laterality: Right;  right arm shunt   PERIPHERAL VASCULAR CATHETERIZATION N/A 06/13/2015   Procedure: A/V Shuntogram/Fistulagram;  Surgeon: Algernon Huxley, MD;  Location: [place] [facility];  Service: Cardiovascular;  Laterality: N/A;   PERIPHERAL VASCULAR CATHETERIZATION N/A 06/13/2015   Procedure: A/V Shunt Intervention;  Surgeon: Algernon Huxley, MD;  Location: [place] [facility];  Service: Cardiovascular;  Laterality: N/A;   TEE WITHOUT CARDIOVERSION N/A 02/22/2016   Procedure: TRANSESOPHAGEAL ECHOCARDIOGRAM (TEE);  Surgeon: Pixie Casino, MD;  Location: [facility] ENDOSCOPY;  Service: Cardiovascular;  Laterality: N/A;   TEE WITHOUT CARDIOVERSION N/A 02/25/2016   Procedure: TRANSESOPHAGEAL ECHOCARDIOGRAM (TEE);  Surgeon: Melrose Nakayama, MD;  Location: [place];  Service: Open Heart Surgery;  Laterality:  N/A;   TEE WITHOUT CARDIOVERSION N/A 10/01/2016   Procedure: TRANSESOPHAGEAL ECHOCARDIOGRAM (TEE);  Surgeon: Pixie Casino, MD;  [facility];  Service: Cardiovascular;  Laterality: N/A;   TUBAL LIGATION  1983    OB History     Gravida  2   Para  2   Term      Preterm      AB      Living  2      SAB      IAB      Ectopic      Multiple      Live Births               Home Medications    Prior to Admission medications   Medication Sig Start Date End Date Taking? Authorizing Provider  acetaminophen (TYLENOL) 500 MG tablet Take 500-1,000 mg by mouth every 6 (six) hours as needed (for pain.).    [provider]  alendronate (FOSAMAX) 70 MG tablet Take 1 tablet (70 mg total) by mouth every 7 (seven) days. Take with a full glass of water on an empty stomach. Patient not taking: Reported on 05/10/2021 03/16/21   Princess Bruins, MD  cinacalcet (SENSIPAR) 90 MG tablet Take 90 mg by mouth at bedtime.    [provider]  clopidogrel (PLAVIX) 75 MG tablet Take 1 tablet (75 mg total) by mouth daily. Patient taking differently: Take 75 mg by mouth every evening. 01/23/17   Mikell, Jeani Sow, MD  famotidine (PEPCID) 20 MG tablet Take 20 mg by mouth daily after supper.    [provider]  midodrine (PROAMATINE) 10 MG tablet Take 10 mg by mouth See admin instructions. Take 1 tablet (10 mg) by mouth before dialysis & take 1 tablet (10 mg) while at dialysis on Mondays, Wednesdays & Fridays. 08/15/20   [provider]  Multiple Vitamins-Minerals (PRORENAL QD PO) Take 1 tablet by mouth at bedtime.     [provider]  polyethylene glycol-electrolytes (NULYTELY) 420 g solution Take 4,000 mLs by mouth as directed. 02/07/21   [provider]  sevelamer carbonate (RENVELA) 800 MG tablet Take 2,400 mg by mouth See admin instructions. Take 3 tablets (2400 mg) by mouth after each meal and after each snack    [provider]    Family History Family History  Problem Relation Age of Onset   Diabetes Mother    Hypertension Mother    Heart disease Mother    Alcohol abuse Mother    Kidney disease Mother    Cancer Father        COLON   Alcohol abuse Father    Breast cancer Sister 48   Hypertension Sister    Kidney disease Sister    Hypertension Son    Kidney disease Son     Social History Social History  Tobacco Use   Smoking status: Never   Smokeless tobacco: Never  Vaping Use   Vaping Use: Never used  Substance Use Topics   Alcohol use: No   Drug use: No     Allergies   Iodinated contrast media, Ancef [cefazolin], and Naproxen sodium   Review of Systems Review of Systems  Constitutional:  Positive for activity change and fatigue. Negative for appetite change and fever.  HENT:  Negative for congestion, sinus pressure, sneezing and sore throat.   Respiratory:  Negative for cough and shortness of breath.   Cardiovascular:  Negative for chest pain.  Gastrointestinal:  Negative for abdominal pain, diarrhea (Resolved), nausea and vomiting.  Musculoskeletal:  Positive for arthralgias and myalgias.  Neurological:  Negative for dizziness, light-headedness and headaches.     Physical Exam Triage Vital Signs ED Triage Vitals  Enc Vitals Group     BP 08/08/21 1217 132/78     Pulse Rate 08/08/21 1217 (!) 59     Resp 08/08/21 1217 20     Temp 08/08/21 1217 98.4 F (36.9 C)     Temp Source 08/08/21 1217 Oral     SpO2 08/08/21 1217 98 %     Weight --      Height --      Head Circumference --      Peak Flow --      Pain Score 08/08/21 1214 7     Pain Loc --      Pain Edu? --      Excl. in [place]? --    No data found.  Updated Vital Signs BP 132/78 (BP Location: Right Leg) Comment (BP Location): large cuff on lower leg  Pulse (!) 59   Temp 98.4 F (36.9 C) (Oral)   Resp 20   SpO2 98%   Visual Acuity Right Eye Distance:   Left Eye Distance:   Bilateral Distance:     Right Eye Near:   Left Eye Near:    Bilateral Near:     Physical Exam Vitals reviewed.  Constitutional:      General: She is awake. She is not in acute distress.    Appearance: Normal appearance. She is well-developed. She is not ill-appearing.     Comments: Very pleasant female appears stated age in no acute distress  HENT:     Head: Normocephalic and atraumatic.     Right Ear: Tympanic membrane, ear canal and external ear normal. Tympanic membrane is not erythematous or bulging.     Left Ear: Tympanic membrane, ear canal and external ear normal. Tympanic membrane is not erythematous or bulging.     Nose:     Right Sinus: No maxillary sinus tenderness or frontal sinus tenderness.     Left Sinus: No maxillary sinus tenderness or frontal sinus tenderness.     Mouth/Throat:     Pharynx: Uvula midline. No oropharyngeal exudate or posterior oropharyngeal erythema.  Cardiovascular:     Rate and Rhythm: Normal rate and regular rhythm.     Heart sounds: Normal heart sounds, S1 normal and S2 normal. No murmur heard. Pulmonary:     Effort: Pulmonary effort is normal.     Breath sounds: Normal breath sounds. No wheezing, rhonchi or rales.     Comments: Clear to auscultation bilaterally Abdominal:     General: Bowel sounds are normal.     Palpations: Abdomen is soft.     Tenderness: There is abdominal tenderness in the left upper quadrant. There is no  right CVA tenderness, left CVA tenderness, guarding or rebound. Negative signs include Murphy's sign.     Comments: Mild tenderness palpation in left upper quadrant.  No evidence of acute abdomen on physical exam.  Lymphadenopathy:     Head:     Right side of head: No submental, submandibular or tonsillar adenopathy.     Left side of head: No submental, submandibular or tonsillar adenopathy.     Cervical: No cervical adenopathy.  Psychiatric:        Behavior: Behavior is cooperative.      UC Treatments / Results  Labs (all labs  ordered are listed, but only abnormal results are displayed) Labs Reviewed  CBC WITH DIFFERENTIAL/PLATELET - Abnormal; Notable for the following components:      Result Value   Hemoglobin 11.7 (*)    All other components within normal limits  COMPREHENSIVE METABOLIC PANEL - Abnormal; Notable for the following components:   Chloride 96 (*)    Creatinine, Ser 7.08 (*)    AST 12 (*)    Alkaline Phosphatase 174 (*)    GFR, Estimated 6 (*)    All other components within normal limits  CK - Abnormal; Notable for the following components:   Total CK 31 (*)    All other components within normal limits    EKG   Radiology No results found.  Procedures Procedures (including critical care time)  Medications Ordered in UC Medications - No data to display  Initial Impression / Assessment and Plan / UC Course  I have reviewed the triage vital signs and the nursing notes.  Pertinent labs & imaging results that were available during my care of the patient were reviewed by me and considered in my medical decision making (see chart for details).     Patient is well-appearing, afebrile, nontoxic, nontachycardic.  Physical exam is reassuring today.  Basic labs including CBC, CMP, CK were obtained and were stable with no obvious etiology of symptoms.  Offered viral testing but given patient has no congestion or cough this was declined.  Chest x-ray was deferred as she is afebrile with oxygen saturation of 98% and clear exam.  Encouraged her to rest and use Tylenol in a scheduled basis to manage symptoms.  She is to follow-up for scheduled dialysis tomorrow.  Recommended follow-up with PCP as soon as possible.  Discussed that if she develops any additional symptoms she should return for reevaluation.  If she has any severe symptoms she is to go to the emergency room to which she expressed understanding.  Final Clinical Impressions(s) / UC Diagnoses   Final diagnoses:  Generalized body aches   Malaise and fatigue     Discharge Instructions      Your lab work is reassuring with no obvious cause of your symptoms.  It is possible that you are developing a virus and if you have any other symptoms please return so we can do some additional testing.  Make sure you follow-up with dialysis tomorrow as scheduled.  If anything worsens you need to be seen immediately.     ED Prescriptions   None    PDMP not reviewed this encounter.   Terrilee Croak, PA-C 08/08/21 1436

## 2021-08-09 DIAGNOSIS — N2581 Secondary hyperparathyroidism of renal origin: Secondary | ICD-10-CM | POA: Diagnosis not present

## 2021-08-09 DIAGNOSIS — D509 Iron deficiency anemia, unspecified: Secondary | ICD-10-CM | POA: Diagnosis not present

## 2021-08-09 DIAGNOSIS — N186 End stage renal disease: Secondary | ICD-10-CM | POA: Diagnosis not present

## 2021-08-09 DIAGNOSIS — Z992 Dependence on renal dialysis: Secondary | ICD-10-CM | POA: Diagnosis not present

## 2021-08-09 DIAGNOSIS — D631 Anemia in chronic kidney disease: Secondary | ICD-10-CM | POA: Diagnosis not present

## 2021-08-11 DIAGNOSIS — D509 Iron deficiency anemia, unspecified: Secondary | ICD-10-CM | POA: Diagnosis not present

## 2021-08-11 DIAGNOSIS — N2581 Secondary hyperparathyroidism of renal origin: Secondary | ICD-10-CM | POA: Diagnosis not present

## 2021-08-11 DIAGNOSIS — D631 Anemia in chronic kidney disease: Secondary | ICD-10-CM | POA: Diagnosis not present

## 2021-08-11 DIAGNOSIS — N186 End stage renal disease: Secondary | ICD-10-CM | POA: Diagnosis not present

## 2021-08-11 DIAGNOSIS — Z992 Dependence on renal dialysis: Secondary | ICD-10-CM | POA: Diagnosis not present

## 2021-08-14 DIAGNOSIS — D631 Anemia in chronic kidney disease: Secondary | ICD-10-CM | POA: Diagnosis not present

## 2021-08-14 DIAGNOSIS — Z992 Dependence on renal dialysis: Secondary | ICD-10-CM | POA: Diagnosis not present

## 2021-08-14 DIAGNOSIS — N186 End stage renal disease: Secondary | ICD-10-CM | POA: Diagnosis not present

## 2021-08-14 DIAGNOSIS — D509 Iron deficiency anemia, unspecified: Secondary | ICD-10-CM | POA: Diagnosis not present

## 2021-08-14 DIAGNOSIS — N2581 Secondary hyperparathyroidism of renal origin: Secondary | ICD-10-CM | POA: Diagnosis not present

## 2021-08-16 DIAGNOSIS — N186 End stage renal disease: Secondary | ICD-10-CM | POA: Diagnosis not present

## 2021-08-16 DIAGNOSIS — Z992 Dependence on renal dialysis: Secondary | ICD-10-CM | POA: Diagnosis not present

## 2021-08-16 DIAGNOSIS — D631 Anemia in chronic kidney disease: Secondary | ICD-10-CM | POA: Diagnosis not present

## 2021-08-16 DIAGNOSIS — N2581 Secondary hyperparathyroidism of renal origin: Secondary | ICD-10-CM | POA: Diagnosis not present

## 2021-08-16 DIAGNOSIS — D509 Iron deficiency anemia, unspecified: Secondary | ICD-10-CM | POA: Diagnosis not present

## 2021-08-18 DIAGNOSIS — Z992 Dependence on renal dialysis: Secondary | ICD-10-CM | POA: Diagnosis not present

## 2021-08-18 DIAGNOSIS — N2581 Secondary hyperparathyroidism of renal origin: Secondary | ICD-10-CM | POA: Diagnosis not present

## 2021-08-18 DIAGNOSIS — N186 End stage renal disease: Secondary | ICD-10-CM | POA: Diagnosis not present

## 2021-08-18 DIAGNOSIS — D631 Anemia in chronic kidney disease: Secondary | ICD-10-CM | POA: Diagnosis not present

## 2021-08-18 DIAGNOSIS — D509 Iron deficiency anemia, unspecified: Secondary | ICD-10-CM | POA: Diagnosis not present

## 2021-08-21 DIAGNOSIS — D631 Anemia in chronic kidney disease: Secondary | ICD-10-CM | POA: Diagnosis not present

## 2021-08-21 DIAGNOSIS — Z992 Dependence on renal dialysis: Secondary | ICD-10-CM | POA: Diagnosis not present

## 2021-08-21 DIAGNOSIS — D509 Iron deficiency anemia, unspecified: Secondary | ICD-10-CM | POA: Diagnosis not present

## 2021-08-21 DIAGNOSIS — N2581 Secondary hyperparathyroidism of renal origin: Secondary | ICD-10-CM | POA: Diagnosis not present

## 2021-08-21 DIAGNOSIS — N186 End stage renal disease: Secondary | ICD-10-CM | POA: Diagnosis not present

## 2021-08-23 DIAGNOSIS — N186 End stage renal disease: Secondary | ICD-10-CM | POA: Diagnosis not present

## 2021-08-23 DIAGNOSIS — D509 Iron deficiency anemia, unspecified: Secondary | ICD-10-CM | POA: Diagnosis not present

## 2021-08-23 DIAGNOSIS — Z992 Dependence on renal dialysis: Secondary | ICD-10-CM | POA: Diagnosis not present

## 2021-08-23 DIAGNOSIS — N2581 Secondary hyperparathyroidism of renal origin: Secondary | ICD-10-CM | POA: Diagnosis not present

## 2021-08-23 DIAGNOSIS — D631 Anemia in chronic kidney disease: Secondary | ICD-10-CM | POA: Diagnosis not present

## 2021-08-25 DIAGNOSIS — D631 Anemia in chronic kidney disease: Secondary | ICD-10-CM | POA: Diagnosis not present

## 2021-08-25 DIAGNOSIS — N2581 Secondary hyperparathyroidism of renal origin: Secondary | ICD-10-CM | POA: Diagnosis not present

## 2021-08-25 DIAGNOSIS — Z992 Dependence on renal dialysis: Secondary | ICD-10-CM | POA: Diagnosis not present

## 2021-08-25 DIAGNOSIS — D509 Iron deficiency anemia, unspecified: Secondary | ICD-10-CM | POA: Diagnosis not present

## 2021-08-25 DIAGNOSIS — N186 End stage renal disease: Secondary | ICD-10-CM | POA: Diagnosis not present

## 2021-08-26 DIAGNOSIS — Z992 Dependence on renal dialysis: Secondary | ICD-10-CM | POA: Diagnosis not present

## 2021-08-26 DIAGNOSIS — N186 End stage renal disease: Secondary | ICD-10-CM | POA: Diagnosis not present

## 2021-08-26 DIAGNOSIS — N039 Chronic nephritic syndrome with unspecified morphologic changes: Secondary | ICD-10-CM | POA: Diagnosis not present

## 2021-08-28 DIAGNOSIS — N2581 Secondary hyperparathyroidism of renal origin: Secondary | ICD-10-CM | POA: Diagnosis not present

## 2021-08-28 DIAGNOSIS — D509 Iron deficiency anemia, unspecified: Secondary | ICD-10-CM | POA: Diagnosis not present

## 2021-08-28 DIAGNOSIS — N186 End stage renal disease: Secondary | ICD-10-CM | POA: Diagnosis not present

## 2021-08-28 DIAGNOSIS — D631 Anemia in chronic kidney disease: Secondary | ICD-10-CM | POA: Diagnosis not present

## 2021-08-28 DIAGNOSIS — Z992 Dependence on renal dialysis: Secondary | ICD-10-CM | POA: Diagnosis not present

## 2021-08-30 DIAGNOSIS — D509 Iron deficiency anemia, unspecified: Secondary | ICD-10-CM | POA: Diagnosis not present

## 2021-08-30 DIAGNOSIS — D631 Anemia in chronic kidney disease: Secondary | ICD-10-CM | POA: Diagnosis not present

## 2021-08-30 DIAGNOSIS — Z992 Dependence on renal dialysis: Secondary | ICD-10-CM | POA: Diagnosis not present

## 2021-08-30 DIAGNOSIS — N2581 Secondary hyperparathyroidism of renal origin: Secondary | ICD-10-CM | POA: Diagnosis not present

## 2021-08-30 DIAGNOSIS — N186 End stage renal disease: Secondary | ICD-10-CM | POA: Diagnosis not present

## 2021-09-01 DIAGNOSIS — D631 Anemia in chronic kidney disease: Secondary | ICD-10-CM | POA: Diagnosis not present

## 2021-09-01 DIAGNOSIS — D509 Iron deficiency anemia, unspecified: Secondary | ICD-10-CM | POA: Diagnosis not present

## 2021-09-01 DIAGNOSIS — Z992 Dependence on renal dialysis: Secondary | ICD-10-CM | POA: Diagnosis not present

## 2021-09-01 DIAGNOSIS — N2581 Secondary hyperparathyroidism of renal origin: Secondary | ICD-10-CM | POA: Diagnosis not present

## 2021-09-01 DIAGNOSIS — N186 End stage renal disease: Secondary | ICD-10-CM | POA: Diagnosis not present

## 2021-09-04 DIAGNOSIS — N2581 Secondary hyperparathyroidism of renal origin: Secondary | ICD-10-CM | POA: Diagnosis not present

## 2021-09-04 DIAGNOSIS — D509 Iron deficiency anemia, unspecified: Secondary | ICD-10-CM | POA: Diagnosis not present

## 2021-09-04 DIAGNOSIS — N186 End stage renal disease: Secondary | ICD-10-CM | POA: Diagnosis not present

## 2021-09-04 DIAGNOSIS — Z992 Dependence on renal dialysis: Secondary | ICD-10-CM | POA: Diagnosis not present

## 2021-09-04 DIAGNOSIS — D631 Anemia in chronic kidney disease: Secondary | ICD-10-CM | POA: Diagnosis not present

## 2021-09-06 DIAGNOSIS — Z992 Dependence on renal dialysis: Secondary | ICD-10-CM | POA: Diagnosis not present

## 2021-09-06 DIAGNOSIS — D631 Anemia in chronic kidney disease: Secondary | ICD-10-CM | POA: Diagnosis not present

## 2021-09-06 DIAGNOSIS — N186 End stage renal disease: Secondary | ICD-10-CM | POA: Diagnosis not present

## 2021-09-06 DIAGNOSIS — D509 Iron deficiency anemia, unspecified: Secondary | ICD-10-CM | POA: Diagnosis not present

## 2021-09-06 DIAGNOSIS — N2581 Secondary hyperparathyroidism of renal origin: Secondary | ICD-10-CM | POA: Diagnosis not present

## 2021-09-08 DIAGNOSIS — D509 Iron deficiency anemia, unspecified: Secondary | ICD-10-CM | POA: Diagnosis not present

## 2021-09-08 DIAGNOSIS — Z992 Dependence on renal dialysis: Secondary | ICD-10-CM | POA: Diagnosis not present

## 2021-09-08 DIAGNOSIS — D631 Anemia in chronic kidney disease: Secondary | ICD-10-CM | POA: Diagnosis not present

## 2021-09-08 DIAGNOSIS — N2581 Secondary hyperparathyroidism of renal origin: Secondary | ICD-10-CM | POA: Diagnosis not present

## 2021-09-08 DIAGNOSIS — N186 End stage renal disease: Secondary | ICD-10-CM | POA: Diagnosis not present

## 2021-09-11 DIAGNOSIS — D509 Iron deficiency anemia, unspecified: Secondary | ICD-10-CM | POA: Diagnosis not present

## 2021-09-11 DIAGNOSIS — N2581 Secondary hyperparathyroidism of renal origin: Secondary | ICD-10-CM | POA: Diagnosis not present

## 2021-09-11 DIAGNOSIS — Z992 Dependence on renal dialysis: Secondary | ICD-10-CM | POA: Diagnosis not present

## 2021-09-11 DIAGNOSIS — N186 End stage renal disease: Secondary | ICD-10-CM | POA: Diagnosis not present

## 2021-09-11 DIAGNOSIS — D631 Anemia in chronic kidney disease: Secondary | ICD-10-CM | POA: Diagnosis not present

## 2021-09-13 DIAGNOSIS — N186 End stage renal disease: Secondary | ICD-10-CM | POA: Diagnosis not present

## 2021-09-13 DIAGNOSIS — D631 Anemia in chronic kidney disease: Secondary | ICD-10-CM | POA: Diagnosis not present

## 2021-09-13 DIAGNOSIS — N2581 Secondary hyperparathyroidism of renal origin: Secondary | ICD-10-CM | POA: Diagnosis not present

## 2021-09-13 DIAGNOSIS — D509 Iron deficiency anemia, unspecified: Secondary | ICD-10-CM | POA: Diagnosis not present

## 2021-09-13 DIAGNOSIS — Z992 Dependence on renal dialysis: Secondary | ICD-10-CM | POA: Diagnosis not present

## 2021-09-15 DIAGNOSIS — Z992 Dependence on renal dialysis: Secondary | ICD-10-CM | POA: Diagnosis not present

## 2021-09-15 DIAGNOSIS — N186 End stage renal disease: Secondary | ICD-10-CM | POA: Diagnosis not present

## 2021-09-15 DIAGNOSIS — N2581 Secondary hyperparathyroidism of renal origin: Secondary | ICD-10-CM | POA: Diagnosis not present

## 2021-09-15 DIAGNOSIS — D509 Iron deficiency anemia, unspecified: Secondary | ICD-10-CM | POA: Diagnosis not present

## 2021-09-15 DIAGNOSIS — D631 Anemia in chronic kidney disease: Secondary | ICD-10-CM | POA: Diagnosis not present

## 2021-09-18 DIAGNOSIS — D631 Anemia in chronic kidney disease: Secondary | ICD-10-CM | POA: Diagnosis not present

## 2021-09-18 DIAGNOSIS — Z992 Dependence on renal dialysis: Secondary | ICD-10-CM | POA: Diagnosis not present

## 2021-09-18 DIAGNOSIS — N2581 Secondary hyperparathyroidism of renal origin: Secondary | ICD-10-CM | POA: Diagnosis not present

## 2021-09-18 DIAGNOSIS — D509 Iron deficiency anemia, unspecified: Secondary | ICD-10-CM | POA: Diagnosis not present

## 2021-09-18 DIAGNOSIS — N186 End stage renal disease: Secondary | ICD-10-CM | POA: Diagnosis not present

## 2021-09-20 DIAGNOSIS — Z992 Dependence on renal dialysis: Secondary | ICD-10-CM | POA: Diagnosis not present

## 2021-09-20 DIAGNOSIS — N2581 Secondary hyperparathyroidism of renal origin: Secondary | ICD-10-CM | POA: Diagnosis not present

## 2021-09-20 DIAGNOSIS — D509 Iron deficiency anemia, unspecified: Secondary | ICD-10-CM | POA: Diagnosis not present

## 2021-09-20 DIAGNOSIS — N186 End stage renal disease: Secondary | ICD-10-CM | POA: Diagnosis not present

## 2021-09-20 DIAGNOSIS — D631 Anemia in chronic kidney disease: Secondary | ICD-10-CM | POA: Diagnosis not present

## 2021-09-21 DIAGNOSIS — T82858A Stenosis of vascular prosthetic devices, implants and grafts, initial encounter: Secondary | ICD-10-CM | POA: Diagnosis not present

## 2021-09-21 DIAGNOSIS — Z992 Dependence on renal dialysis: Secondary | ICD-10-CM | POA: Diagnosis not present

## 2021-09-21 DIAGNOSIS — N186 End stage renal disease: Secondary | ICD-10-CM | POA: Diagnosis not present

## 2021-09-21 DIAGNOSIS — I871 Compression of vein: Secondary | ICD-10-CM | POA: Diagnosis not present

## 2021-09-22 DIAGNOSIS — D509 Iron deficiency anemia, unspecified: Secondary | ICD-10-CM | POA: Diagnosis not present

## 2021-09-22 DIAGNOSIS — D631 Anemia in chronic kidney disease: Secondary | ICD-10-CM | POA: Diagnosis not present

## 2021-09-22 DIAGNOSIS — N2581 Secondary hyperparathyroidism of renal origin: Secondary | ICD-10-CM | POA: Diagnosis not present

## 2021-09-22 DIAGNOSIS — Z992 Dependence on renal dialysis: Secondary | ICD-10-CM | POA: Diagnosis not present

## 2021-09-22 DIAGNOSIS — N186 End stage renal disease: Secondary | ICD-10-CM | POA: Diagnosis not present

## 2021-09-25 DIAGNOSIS — N186 End stage renal disease: Secondary | ICD-10-CM | POA: Diagnosis not present

## 2021-09-25 DIAGNOSIS — Z992 Dependence on renal dialysis: Secondary | ICD-10-CM | POA: Diagnosis not present

## 2021-09-25 DIAGNOSIS — D631 Anemia in chronic kidney disease: Secondary | ICD-10-CM | POA: Diagnosis not present

## 2021-09-25 DIAGNOSIS — D509 Iron deficiency anemia, unspecified: Secondary | ICD-10-CM | POA: Diagnosis not present

## 2021-09-25 DIAGNOSIS — N2581 Secondary hyperparathyroidism of renal origin: Secondary | ICD-10-CM | POA: Diagnosis not present

## 2021-09-26 DIAGNOSIS — N039 Chronic nephritic syndrome with unspecified morphologic changes: Secondary | ICD-10-CM | POA: Diagnosis not present

## 2021-09-26 DIAGNOSIS — Z992 Dependence on renal dialysis: Secondary | ICD-10-CM | POA: Diagnosis not present

## 2021-09-26 DIAGNOSIS — N186 End stage renal disease: Secondary | ICD-10-CM | POA: Diagnosis not present

## 2021-09-27 DIAGNOSIS — Z992 Dependence on renal dialysis: Secondary | ICD-10-CM | POA: Diagnosis not present

## 2021-09-27 DIAGNOSIS — D631 Anemia in chronic kidney disease: Secondary | ICD-10-CM | POA: Diagnosis not present

## 2021-09-27 DIAGNOSIS — D509 Iron deficiency anemia, unspecified: Secondary | ICD-10-CM | POA: Diagnosis not present

## 2021-09-27 DIAGNOSIS — N2581 Secondary hyperparathyroidism of renal origin: Secondary | ICD-10-CM | POA: Diagnosis not present

## 2021-09-27 DIAGNOSIS — N186 End stage renal disease: Secondary | ICD-10-CM | POA: Diagnosis not present

## 2021-09-29 DIAGNOSIS — D509 Iron deficiency anemia, unspecified: Secondary | ICD-10-CM | POA: Diagnosis not present

## 2021-09-29 DIAGNOSIS — N2581 Secondary hyperparathyroidism of renal origin: Secondary | ICD-10-CM | POA: Diagnosis not present

## 2021-09-29 DIAGNOSIS — Z992 Dependence on renal dialysis: Secondary | ICD-10-CM | POA: Diagnosis not present

## 2021-09-29 DIAGNOSIS — D631 Anemia in chronic kidney disease: Secondary | ICD-10-CM | POA: Diagnosis not present

## 2021-09-29 DIAGNOSIS — N186 End stage renal disease: Secondary | ICD-10-CM | POA: Diagnosis not present

## 2021-10-02 DIAGNOSIS — Z992 Dependence on renal dialysis: Secondary | ICD-10-CM | POA: Diagnosis not present

## 2021-10-02 DIAGNOSIS — D509 Iron deficiency anemia, unspecified: Secondary | ICD-10-CM | POA: Diagnosis not present

## 2021-10-02 DIAGNOSIS — D631 Anemia in chronic kidney disease: Secondary | ICD-10-CM | POA: Diagnosis not present

## 2021-10-02 DIAGNOSIS — N186 End stage renal disease: Secondary | ICD-10-CM | POA: Diagnosis not present

## 2021-10-02 DIAGNOSIS — N2581 Secondary hyperparathyroidism of renal origin: Secondary | ICD-10-CM | POA: Diagnosis not present

## 2021-10-04 DIAGNOSIS — D631 Anemia in chronic kidney disease: Secondary | ICD-10-CM | POA: Diagnosis not present

## 2021-10-04 DIAGNOSIS — Z992 Dependence on renal dialysis: Secondary | ICD-10-CM | POA: Diagnosis not present

## 2021-10-04 DIAGNOSIS — D509 Iron deficiency anemia, unspecified: Secondary | ICD-10-CM | POA: Diagnosis not present

## 2021-10-04 DIAGNOSIS — N186 End stage renal disease: Secondary | ICD-10-CM | POA: Diagnosis not present

## 2021-10-04 DIAGNOSIS — N2581 Secondary hyperparathyroidism of renal origin: Secondary | ICD-10-CM | POA: Diagnosis not present

## 2021-10-06 DIAGNOSIS — D509 Iron deficiency anemia, unspecified: Secondary | ICD-10-CM | POA: Diagnosis not present

## 2021-10-06 DIAGNOSIS — N2581 Secondary hyperparathyroidism of renal origin: Secondary | ICD-10-CM | POA: Diagnosis not present

## 2021-10-06 DIAGNOSIS — D631 Anemia in chronic kidney disease: Secondary | ICD-10-CM | POA: Diagnosis not present

## 2021-10-06 DIAGNOSIS — N186 End stage renal disease: Secondary | ICD-10-CM | POA: Diagnosis not present

## 2021-10-06 DIAGNOSIS — Z992 Dependence on renal dialysis: Secondary | ICD-10-CM | POA: Diagnosis not present

## 2021-10-09 DIAGNOSIS — N186 End stage renal disease: Secondary | ICD-10-CM | POA: Diagnosis not present

## 2021-10-09 DIAGNOSIS — D509 Iron deficiency anemia, unspecified: Secondary | ICD-10-CM | POA: Diagnosis not present

## 2021-10-09 DIAGNOSIS — Z992 Dependence on renal dialysis: Secondary | ICD-10-CM | POA: Diagnosis not present

## 2021-10-09 DIAGNOSIS — N2581 Secondary hyperparathyroidism of renal origin: Secondary | ICD-10-CM | POA: Diagnosis not present

## 2021-10-09 DIAGNOSIS — D631 Anemia in chronic kidney disease: Secondary | ICD-10-CM | POA: Diagnosis not present

## 2021-10-11 DIAGNOSIS — N2581 Secondary hyperparathyroidism of renal origin: Secondary | ICD-10-CM | POA: Diagnosis not present

## 2021-10-11 DIAGNOSIS — N186 End stage renal disease: Secondary | ICD-10-CM | POA: Diagnosis not present

## 2021-10-11 DIAGNOSIS — D631 Anemia in chronic kidney disease: Secondary | ICD-10-CM | POA: Diagnosis not present

## 2021-10-11 DIAGNOSIS — D509 Iron deficiency anemia, unspecified: Secondary | ICD-10-CM | POA: Diagnosis not present

## 2021-10-11 DIAGNOSIS — Z992 Dependence on renal dialysis: Secondary | ICD-10-CM | POA: Diagnosis not present

## 2021-10-12 DIAGNOSIS — Z6833 Body mass index (BMI) 33.0-33.9, adult: Secondary | ICD-10-CM | POA: Diagnosis not present

## 2021-10-12 DIAGNOSIS — I361 Nonrheumatic tricuspid (valve) insufficiency: Secondary | ICD-10-CM | POA: Diagnosis not present

## 2021-10-13 DIAGNOSIS — D509 Iron deficiency anemia, unspecified: Secondary | ICD-10-CM | POA: Diagnosis not present

## 2021-10-13 DIAGNOSIS — D631 Anemia in chronic kidney disease: Secondary | ICD-10-CM | POA: Diagnosis not present

## 2021-10-13 DIAGNOSIS — N186 End stage renal disease: Secondary | ICD-10-CM | POA: Diagnosis not present

## 2021-10-13 DIAGNOSIS — Z992 Dependence on renal dialysis: Secondary | ICD-10-CM | POA: Diagnosis not present

## 2021-10-13 DIAGNOSIS — N2581 Secondary hyperparathyroidism of renal origin: Secondary | ICD-10-CM | POA: Diagnosis not present

## 2021-10-16 DIAGNOSIS — D509 Iron deficiency anemia, unspecified: Secondary | ICD-10-CM | POA: Diagnosis not present

## 2021-10-16 DIAGNOSIS — D631 Anemia in chronic kidney disease: Secondary | ICD-10-CM | POA: Diagnosis not present

## 2021-10-16 DIAGNOSIS — Z992 Dependence on renal dialysis: Secondary | ICD-10-CM | POA: Diagnosis not present

## 2021-10-16 DIAGNOSIS — N186 End stage renal disease: Secondary | ICD-10-CM | POA: Diagnosis not present

## 2021-10-16 DIAGNOSIS — N2581 Secondary hyperparathyroidism of renal origin: Secondary | ICD-10-CM | POA: Diagnosis not present

## 2021-10-18 DIAGNOSIS — D509 Iron deficiency anemia, unspecified: Secondary | ICD-10-CM | POA: Diagnosis not present

## 2021-10-18 DIAGNOSIS — N186 End stage renal disease: Secondary | ICD-10-CM | POA: Diagnosis not present

## 2021-10-18 DIAGNOSIS — D631 Anemia in chronic kidney disease: Secondary | ICD-10-CM | POA: Diagnosis not present

## 2021-10-18 DIAGNOSIS — N2581 Secondary hyperparathyroidism of renal origin: Secondary | ICD-10-CM | POA: Diagnosis not present

## 2021-10-18 DIAGNOSIS — Z992 Dependence on renal dialysis: Secondary | ICD-10-CM | POA: Diagnosis not present

## 2021-10-20 DIAGNOSIS — Z992 Dependence on renal dialysis: Secondary | ICD-10-CM | POA: Diagnosis not present

## 2021-10-20 DIAGNOSIS — N186 End stage renal disease: Secondary | ICD-10-CM | POA: Diagnosis not present

## 2021-10-20 DIAGNOSIS — D631 Anemia in chronic kidney disease: Secondary | ICD-10-CM | POA: Diagnosis not present

## 2021-10-20 DIAGNOSIS — D509 Iron deficiency anemia, unspecified: Secondary | ICD-10-CM | POA: Diagnosis not present

## 2021-10-20 DIAGNOSIS — N2581 Secondary hyperparathyroidism of renal origin: Secondary | ICD-10-CM | POA: Diagnosis not present

## 2021-10-23 DIAGNOSIS — N2581 Secondary hyperparathyroidism of renal origin: Secondary | ICD-10-CM | POA: Diagnosis not present

## 2021-10-23 DIAGNOSIS — D509 Iron deficiency anemia, unspecified: Secondary | ICD-10-CM | POA: Diagnosis not present

## 2021-10-23 DIAGNOSIS — Z992 Dependence on renal dialysis: Secondary | ICD-10-CM | POA: Diagnosis not present

## 2021-10-23 DIAGNOSIS — D631 Anemia in chronic kidney disease: Secondary | ICD-10-CM | POA: Diagnosis not present

## 2021-10-23 DIAGNOSIS — N186 End stage renal disease: Secondary | ICD-10-CM | POA: Diagnosis not present

## 2021-10-25 DIAGNOSIS — N2581 Secondary hyperparathyroidism of renal origin: Secondary | ICD-10-CM | POA: Diagnosis not present

## 2021-10-25 DIAGNOSIS — D509 Iron deficiency anemia, unspecified: Secondary | ICD-10-CM | POA: Diagnosis not present

## 2021-10-25 DIAGNOSIS — Z992 Dependence on renal dialysis: Secondary | ICD-10-CM | POA: Diagnosis not present

## 2021-10-25 DIAGNOSIS — D631 Anemia in chronic kidney disease: Secondary | ICD-10-CM | POA: Diagnosis not present

## 2021-10-25 DIAGNOSIS — N186 End stage renal disease: Secondary | ICD-10-CM | POA: Diagnosis not present

## 2021-10-27 DIAGNOSIS — N186 End stage renal disease: Secondary | ICD-10-CM | POA: Diagnosis not present

## 2021-10-27 DIAGNOSIS — D509 Iron deficiency anemia, unspecified: Secondary | ICD-10-CM | POA: Diagnosis not present

## 2021-10-27 DIAGNOSIS — N039 Chronic nephritic syndrome with unspecified morphologic changes: Secondary | ICD-10-CM | POA: Diagnosis not present

## 2021-10-27 DIAGNOSIS — N2581 Secondary hyperparathyroidism of renal origin: Secondary | ICD-10-CM | POA: Diagnosis not present

## 2021-10-27 DIAGNOSIS — Z992 Dependence on renal dialysis: Secondary | ICD-10-CM | POA: Diagnosis not present

## 2021-10-27 DIAGNOSIS — D631 Anemia in chronic kidney disease: Secondary | ICD-10-CM | POA: Diagnosis not present

## 2021-10-30 DIAGNOSIS — N186 End stage renal disease: Secondary | ICD-10-CM | POA: Diagnosis not present

## 2021-10-30 DIAGNOSIS — Z992 Dependence on renal dialysis: Secondary | ICD-10-CM | POA: Diagnosis not present

## 2021-10-30 DIAGNOSIS — D509 Iron deficiency anemia, unspecified: Secondary | ICD-10-CM | POA: Diagnosis not present

## 2021-10-30 DIAGNOSIS — D631 Anemia in chronic kidney disease: Secondary | ICD-10-CM | POA: Diagnosis not present

## 2021-10-30 DIAGNOSIS — N2581 Secondary hyperparathyroidism of renal origin: Secondary | ICD-10-CM | POA: Diagnosis not present

## 2021-11-01 DIAGNOSIS — D631 Anemia in chronic kidney disease: Secondary | ICD-10-CM | POA: Diagnosis not present

## 2021-11-01 DIAGNOSIS — N186 End stage renal disease: Secondary | ICD-10-CM | POA: Diagnosis not present

## 2021-11-01 DIAGNOSIS — D509 Iron deficiency anemia, unspecified: Secondary | ICD-10-CM | POA: Diagnosis not present

## 2021-11-01 DIAGNOSIS — N2581 Secondary hyperparathyroidism of renal origin: Secondary | ICD-10-CM | POA: Diagnosis not present

## 2021-11-01 DIAGNOSIS — Z992 Dependence on renal dialysis: Secondary | ICD-10-CM | POA: Diagnosis not present

## 2021-11-02 ENCOUNTER — Ambulatory Visit: Payer: Medicare Other | Attending: Physician Assistant | Admitting: Physician Assistant

## 2021-11-02 ENCOUNTER — Encounter: Payer: Self-pay | Admitting: Physician Assistant

## 2021-11-02 VITALS — BP 110/64 | HR 69 | Ht 60.0 in | Wt 169.0 lb

## 2021-11-02 DIAGNOSIS — I071 Rheumatic tricuspid insufficiency: Secondary | ICD-10-CM | POA: Diagnosis not present

## 2021-11-02 DIAGNOSIS — Z952 Presence of prosthetic heart valve: Secondary | ICD-10-CM

## 2021-11-02 DIAGNOSIS — I272 Pulmonary hypertension, unspecified: Secondary | ICD-10-CM

## 2021-11-02 NOTE — Patient Instructions (Signed)
Medication Instructions:  Your physician recommends that you continue on your current medications as directed. Please refer to the Current Medication list given to you today. *If you need a refill on your cardiac medications before your next appointment, please call your pharmacy*   Lab Work: None ordered If you have labs (blood work) drawn today and your tests are completely normal, you will receive your results only by: [place] (if you have MyChart) OR A paper copy in the mail If you have any lab test that is abnormal or we need to change your treatment, we will call you to review the results.   Testing/Procedures: None ordered   Follow-Up: At [facility], you and your health needs are our priority.  As part of our continuing mission to provide you with exceptional heart care, we have created designated Provider Care Teams.  These Care Teams include your primary Cardiologist (physician) and Advanced Practice Providers (APPs -  Physician Assistants and Nurse Practitioners) who all work together to provide you with the care you need, when you need it.  We recommend signing up for the patient portal called "MyChart".  Sign up information is provided on this After Visit Summary.  MyChart is used to connect with patients for Virtual Visits (Telemedicine).  Patients are able to view lab/test results, encounter notes, upcoming appointments, etc.  Non-urgent messages can be sent to your provider as well.   To learn more about what you can do with MyChart, go to [email or web address].    Your next appointment:   1 year(s)  The format for your next appointment:   In Person  Provider:   Fransico Him, MD     Other Instructions   Important Information About [place]

## 2021-11-02 NOTE — Progress Notes (Signed)
Cardiology Office Note:    Date:  11/02/2021   ID:  [name] Marks, DOB 05-23-1955, MRN [id number]  PCP:  Eppie Gibson, MD  [facility] Cardiologist:  Fransico Him, MD  [place] Electrophysiologist:  None   Chief Complaint: yearly follow up  History of Present Illness:    [name] Marks is a 66 y.o. female with a hx endocarditis, severe AI s/p bioprosthetic AVR, ESRD on HD, TIA, and moderate TR seen for follow up.   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  This was complicated by severe aortic insufficiency.  She underwent cardiac cath showing normal coronary arteries and underwent bioprosthetic AVR. Unfortunately she subsequently had a TIA in March 2018. S/p ILR implantation by Dr. Curt Bears in 09/2016.  Last echo 08/2019 with LVEF of 65-70%, grade 1 DD, normal RSVP at 28.4 mm Hg, severe TR, normal functioning aortic valve.   NM stress test 10/04/2020 with no clear evidence of significant ischemia or scar.   Patient is here for follow-up.  She is undergoing kidney transplant work-up at [facility].  She has seen by cardiologist at [facility] as a part of work-up.  Underwent echocardiogram 08/2021 showing LV function greater than 55%, s/p bioprosthetic AVR with normal function, mildly dilated RV with normal function.  Severe TR.  She is required to have her valve fixed prior to kidney transplant.  She has follow-up with surgeon later this month.  Patient denies shortness of breath, orthopnea, PND, syncope, lower extremity edema or melena.  She does not make urine.  Does dialysis Monday Wednesday Friday.  Past Medical History:  Diagnosis Date   Anemia    Aortic valve prosthesis present 02/25/2016   Arthritis    "knees" (01/03/2017)   AVD (aortic valve disease) 07/12/2016   Backache 12/06/2008   Bacteremia due to coagulase-negative Staphylococcus    Carpal tunnel syndrome    Cerebral embolism with transient ischemic attack (TIA)     Cholelithiases 01/28/2017   Chronic female pelvic pain 07/01/3974   Complication of anesthesia    01/01/17- '"a long time ago, difficulty breathimg, not sure if it was due to anesthesia or not."   CVA (cerebral vascular accident) ([place]) 05/16/2016   End stage renal disease on dialysis ([place])    "MWF; [place]." (01/03/2017)   Endocarditis    Esophageal reflux 12/06/2008   GERD (gastroesophageal reflux disease)    Gout    History of blood transfusion 2017   "related to blood poison" (01/03/2017)   Increased endometrial stripe thickness 11/03/2015   Neck pain    Osteoporosis 09/2016   T score -2.6   Pain and swelling of right upper extremity 12/20/2015   Pulmonary HTN ([place]) 07/12/2016   Renal dialysis device, implant, or graft complication 73/41/9379   Renal insufficiency    S/P cholecystectomy 02/14/2017   Septic shock ([place])    Staphylococcus aureus bacteremia    TIA (transient ischemic attack)    "several" (01/03/2017)    Past Surgical History:  Procedure Laterality Date   AORTIC VALVE REPLACEMENT N/A 02/25/2016   Procedure: AORTIC VALVE REPLACEMENT (AVR) implanted with Magna Ease Aortic valve size 52m;  Surgeon: SMelrose Nakayama MD;  [place]  Service: Open Heart Surgery;  Laterality: N/A;   AV FISTULA PLACEMENT Left 01/03/2017   Procedure: INSERTION OF ARTERIOVENOUS (AV) GORE-TEX GRAFT LEFT THIGH;  Surgeon: BSerafina Mitchell MD;  [place]  Service: Vascular;  Laterality: Left;   AStory  Right 02/17/2016   Procedure: REMOVAL OF TWO ARTERIOVENOUS GORETEX GRAFTS ([place]);  Surgeon: Angelia Mould, MD;  Location: [facility] OR;  Service: Vascular;  Laterality: Right;   BIOPSY  05/16/2021   Procedure: BIOPSY;  Surgeon: Wilford Corner, MD;  Location: [facility];  Service: Endoscopy;;   BREAST BIOPSY Left 2013   stereo    BREAST BIOPSY Right 2011   stereo    CARDIAC VALVE REPLACEMENT     CHOLECYSTECTOMY N/A 01/30/2017   Procedure: LAPAROSCOPIC  CHOLECYSTECTOMY;  Surgeon: Erroll Luna, MD;  Location: [place];  Service: General;  Laterality: N/A;   COLONOSCOPY W/ POLYPECTOMY     COLONOSCOPY WITH PROPOFOL N/A 05/16/2021   Procedure: COLONOSCOPY WITH PROPOFOL;  Surgeon: Wilford Corner, MD;  Location: [facility];  Service: Endoscopy;  Laterality: N/A;   DG AV DIALYSIS GRAFT DECLOT OR     DILATATION & CURETTAGE/HYSTEROSCOPY WITH TRUECLEAR N/A 11/06/2012   Procedure: DILATATION & CURETTAGE/HYSTEROSCOPY WITH TRUECLEAR;  Surgeon: Terrance Mass, MD;  Location: [place] [facility];  Service: Gynecology;  Laterality: N/A;  Truclear Resectoscopic Polypectomy    INSERTION OF DIALYSIS CATHETER N/A 02/19/2016   Procedure: INSERTION OF Left Internal Jugular DIALYSIS CATHETER;  Surgeon: Angelia Mould, MD;  Location: [place];  Service: Vascular;  Laterality: N/A;   LOOP RECORDER INSERTION N/A 10/01/2016   Procedure: LOOP RECORDER INSERTION;  Surgeon: Constance Haw, MD;  Location: [place] CV LAB;  Service: Cardiovascular;  Laterality: N/A;   PATCH ANGIOPLASTY Right 02/17/2016   Procedure: PATCH ANGIOPLASTY;  Surgeon: Angelia Mould, MD;  Location: [place];  Service: Vascular;  Laterality: Right;   PERIPHERAL VASCULAR CATHETERIZATION N/A 09/14/2014   Procedure: A/V Shuntogram/Fistulagram;  Surgeon: Katha Cabal, MD;  Location: [place] CV LAB;  Service: Cardiovascular;  Laterality: N/A;   PERIPHERAL VASCULAR CATHETERIZATION N/A 09/14/2014   Procedure: A/V Shunt Intervention;  Surgeon: Katha Cabal, MD;  Location: [place] CV LAB;  Service: Cardiovascular;  Laterality: N/A;   PERIPHERAL VASCULAR CATHETERIZATION Right 12/09/2014   Procedure: A/V Shuntogram/Fistulagram;  Surgeon: Algernon Huxley, MD;  Location: [place] [facility];  Service: Cardiovascular;  Laterality: Right;   PERIPHERAL VASCULAR CATHETERIZATION N/A 12/09/2014   Procedure: A/V Shunt Intervention;  Surgeon: Algernon Huxley, MD;  Location: [place] CV LAB;   Service: Cardiovascular;  Laterality: N/A;   PERIPHERAL VASCULAR CATHETERIZATION Right 05/24/2015   Procedure: A/V Shuntogram;  Surgeon: Serafina Mitchell, MD;  Location: [place] CV LAB;  Service: Cardiovascular;  Laterality: Right;   PERIPHERAL VASCULAR CATHETERIZATION Right 05/24/2015   Procedure: Peripheral Vascular Balloon Angioplasty;  Surgeon: Serafina Mitchell, MD;  Location: [place] CV LAB;  Service: Cardiovascular;  Laterality: Right;  right arm shunt   PERIPHERAL VASCULAR CATHETERIZATION N/A 06/13/2015   Procedure: A/V Shuntogram/Fistulagram;  Surgeon: Algernon Huxley, MD;  Location: [place] [facility];  Service: Cardiovascular;  Laterality: N/A;   PERIPHERAL VASCULAR CATHETERIZATION N/A 06/13/2015   Procedure: A/V Shunt Intervention;  Surgeon: Algernon Huxley, MD;  Location: [place] CV LAB;  Service: Cardiovascular;  Laterality: N/A;   TEE WITHOUT CARDIOVERSION N/A 02/22/2016   Procedure: TRANSESOPHAGEAL ECHOCARDIOGRAM (TEE);  Surgeon: Pixie Casino, MD;  Location: [facility] ENDOSCOPY;  Service: Cardiovascular;  Laterality: N/A;   TEE WITHOUT CARDIOVERSION N/A 02/25/2016   Procedure: TRANSESOPHAGEAL ECHOCARDIOGRAM (TEE);  Surgeon: Melrose Nakayama, MD;  Location: [place];  Service: Open Heart Surgery;  Laterality: N/A;   TEE WITHOUT CARDIOVERSION N/A 10/01/2016   Procedure: TRANSESOPHAGEAL ECHOCARDIOGRAM (TEE);  Surgeon: Pixie Casino, MD;  Location: [facility] ENDOSCOPY;  Service: Cardiovascular;  Laterality: N/A;   TUBAL LIGATION  1983    Current Medications: Current Meds  Medication Sig   acetaminophen (TYLENOL) 500 MG tablet Take 500-1,000 mg by mouth every 6 (six) hours as needed (for pain.).   alendronate (FOSAMAX) 70 MG tablet Take 1 tablet (70 mg total) by mouth every 7 (seven) days. Take with a full glass of water on an empty stomach.   cinacalcet (SENSIPAR) 90 MG tablet Take 90 mg by mouth at bedtime.   clopidogrel (PLAVIX) 75 MG tablet Take 1 tablet (75 mg total) by mouth daily.  (Patient taking differently: Take 75 mg by mouth every evening.)   famotidine (PEPCID) 20 MG tablet Take 20 mg by mouth daily after supper.   midodrine (PROAMATINE) 10 MG tablet Take 10 mg by mouth See admin instructions. Take 1 tablet (10 mg) by mouth before dialysis & take 1 tablet (10 mg) while at dialysis on Mondays, Wednesdays & Fridays.   Multiple Vitamins-Minerals (PRORENAL QD PO) Take 1 tablet by mouth at bedtime.    polyethylene glycol-electrolytes (NULYTELY) 420 g solution Take 4,000 mLs by mouth as directed.   sevelamer carbonate (RENVELA) 800 MG tablet Take 2,400 mg by mouth See admin instructions. Take 3 tablets (2400 mg) by mouth after each meal and after each snack     Allergies:   Iodinated contrast media, Ancef [cefazolin], and Naproxen sodium   Social History   Socioeconomic History   Marital status: Divorced    Spouse name: Not on file   Number of children: Not on file   Years of education: Not on file   Highest education level: Not on file  Occupational History   Not on file  Tobacco Use   Smoking status: Never   Smokeless tobacco: Never  Vaping Use   Vaping Use: Never used  Substance and Sexual Activity   Alcohol use: No   Drug use: No   Sexual activity: Not Currently    Birth control/protection: Post-menopausal    Comment: 1st intercourse 66 yo-Fewer than 5 partners  Other Topics Concern   Not on file  Social History Narrative   Uses cane at home.   Social Determinants of Health   Financial Resource Strain: Not on file  Food Insecurity: Not on file  Transportation Needs: Not on file  Physical Activity: Not on file  Stress: Not on file  Social Connections: Not on file     Family History: The patient's family history includes Alcohol abuse in her father and mother; Breast cancer (age of onset: 58) in her sister; Cancer in her father; Diabetes in her mother; Heart disease in her mother; Hypertension in her mother, sister, and son; Kidney disease in  her mother, sister, and son.    ROS:   Please see the history of present illness.    All other systems reviewed and are negative.    EKGs/Labs/Other Studies Reviewed:    The following studies were reviewed today:   ECho 08/2021 @ [facility] Summary    1. The left ventricle is normal in size with normal wall thickness.    2. The left ventricular systolic function is normal, LVEF is visually  estimated at > 55%.    3. Aortic valve replacement (bioprosthetic, implantation date:  2017).    4. Aortic valve Doppler indices are consistent with normal prosthetic valve  function.    5. The left atrium is mildly dilated in size.  6. The right ventricle is mildly dilated in size, with normal systolic  function.    7. There is severe tricuspid regurgitation.    8. The right atrium is mildly dilated  in size.    9. IVC size and inspiratory change suggest elevated right atrial pressure.  (10-20 mmHg).    Left Ventricle    The left ventricle is normal in size with normal wall thickness.    The left ventricular systolic function is normal, LVEF is visually estimated  at > 55%.    There is normal left ventricular diastolic function.   Right Ventricle    The right ventricle is mildly dilated in size, with normal systolic  function.    Left Atrium    The left atrium is mildly dilated in size.   Right Atrium    The right atrium is mildly dilated  in size.    Aortic Valve    The aortic valve is trileaflet with normal appearing leaflets with normal  excursion.    There is no significant aortic regurgitation.    There is no evidence of a significant transvalvular gradient.    Aortic valve replacement (bioprosthetic, implantation date: 2017).    Mean gradient: 9 mmHg.    The prosthetic aortic valve is well seated.    The prosthetic aortic valve leaflets are thin and pliable.    There is no regurgitation of the prosthetic aortic valve.    Aortic valve Doppler indices are consistent with  normal prosthetic valve  function.    AV estimated effective orifice area (by continuity equation):  1.5 cm2.   Pulmonic Valve    The pulmonic valve is normal.    There is no significant pulmonic regurgitation.    There is no evidence of a significant transvalvular gradient.   Mitral Valve    The mitral valve leaflets are normal with normal leaflet mobility.    There is no significant mitral valve regurgitation.   Tricuspid Valve    The tricuspid valve leaflets are normal, with normal leaflet mobility.    There is severe tricuspid regurgitation.    The pulmonary systolic pressure cannot be estimated due to severe TR.    Pericardium/Pleural    There is no pericardial effusion.   Inferior Vena Cava    IVC size and inspiratory change suggest elevated right atrial pressure.  (10-20 mmHg).   Aorta    The aorta is normal in size in the visualized segments.    Left Ventricular Outflow Tract  ----------------------------------------------------------------------  Name                                 Value        Normal  ----------------------------------------------------------------------   LVOT 2D  ----------------------------------------------------------------------  LVOT Diameter                       1.6 cm                LVOT Area                          2.0 cm2                 LVOT Doppler  ----------------------------------------------------------------------  LVOT Peak Velocity                 1.4 m/s  LVOT VTI                             29 cm                LVOT Stroke Volume                   58 ml   Pulmonic Valve  ----------------------------------------------------------------------  Name                                 Value        Normal  ----------------------------------------------------------------------   PV Doppler  ----------------------------------------------------------------------  PV Peak Velocity                   0.8 m/s    Mitral Valve  ----------------------------------------------------------------------  Name                                 Value        Normal  ----------------------------------------------------------------------   MV Diastolic Function  ----------------------------------------------------------------------  MV E Peak Velocity                 74 cm/s                MV A Peak Velocity                 66 cm/s                MV E/A                                 1.1                 MV Annular TDI  ----------------------------------------------------------------------  MV Septal e' Velocity             8.4 cm/s         >=8.0  MV Lateral e' Velocity           12.9 cm/s        >=10.0  MV e' Average                         10.6                MV E/e' (Average)                      7.3   Tricuspid Valve  ----------------------------------------------------------------------  Name                                 Value        Normal  ----------------------------------------------------------------------   TV Regurgitation Doppler  ----------------------------------------------------------------------  TR Peak Velocity                   2.6 m/s                 Estimated PAP/RSVP  ----------------------------------------------------------------------  RA Pressure                        15 mmHg           <=  5  RV Systolic Pressure               41 mmHg           <36   Aorta  ----------------------------------------------------------------------  Name                                 Value        Normal  ----------------------------------------------------------------------   Ascending Aorta  ----------------------------------------------------------------------  Ao Root Diameter (2D)               2.0 cm                Ao Root Diam Index (2D)         11.0 cm/m2   Venous  ----------------------------------------------------------------------  Name                                  Value        Normal  ----------------------------------------------------------------------   IVC/SVC  ----------------------------------------------------------------------  IVC Diameter (Exp 2D)               2.1 cm         <=2.1   Aortic Valve  ----------------------------------------------------------------------  Name                                 Value        Normal  ----------------------------------------------------------------------   AV Doppler  ----------------------------------------------------------------------  AV Mean Gradient                    9 mmHg                AV VTI                               38 cm                AV Area (Cont Eq VTI)              1.5 cm2         >=3.0  AV Area Index (Cont Eq VTI)     0.8 cm2/m2   Ventricles  ----------------------------------------------------------------------  Name                                 Value        Normal  ----------------------------------------------------------------------   LV Dimensions 2D/MM  ----------------------------------------------------------------------  IVS Diastolic Thickness (2D)        0.6 cm       0.6-0.9  LVID Diastole (2D)                  3.9 cm       4.7-0.9  LVPW Diastolic Thickness  (2D)                                0.6 cm       0.6-0.9  LVID Systole (2D)                   2.4 cm       2.2-3.5  LVOT  Diameter                       1.6 cm                 RV Dimensions 2D/MM  ----------------------------------------------------------------------  RV Basal Diastolic Dimension        4.5 cm       2.5-4.1  TAPSE                               1.3 cm         >=1.7   Atria  ----------------------------------------------------------------------  Name                                 Value        Normal  ----------------------------------------------------------------------   LA Dimensions  ----------------------------------------------------------------------  LA Dimension (2D)                    3.8 cm       2.7-3.8   RA Dimensions  ----------------------------------------------------------------------  RA Area (4C)                      11.7 cm2        <=18.0  RA Area (4C) Index              6.4 cm2/m2     NM stress 10/04/20 - No clear evidence of significant ischemia or scar. - There is a small in size, mild in severity, partially reversible defect involving apical anterior and apical lateral segments, likely an artifact but cannot rule out mild ischemia.  - Left ventricular systolic function is hyperdynamic. Post stress the ejection fraction is calculated at 90%. The left ventricle is relatively small. - Mitral annular calcifications are noted - Central venous catheter extending from the left innominate vein with tip at the SVC/RA junction. - Bioprosthetic aortic valve noted in the aortic position.  Echo 08/2019  1. Left ventricular ejection fraction, by estimation, is 65 to 70%. The  left ventricle has normal function. The left ventricle has no regional  wall motion abnormalities. Left ventricular diastolic parameters are  consistent with Grade I diastolic  dysfunction (impaired relaxation).   2. Right ventricular systolic function is normal. The right ventricular  size is mildly enlarged. There is normal pulmonary artery systolic  pressure. The estimated right ventricular systolic pressure is 48.8 mmHg.   3. Left atrial size was mildly dilated.   4. Right atrial size was mildly dilated.   5. The mitral valve is normal in structure. Mild mitral valve  regurgitation. No evidence of mitral stenosis.   6. Tricuspid valve regurgitation is severe.   7. The aortic valve has been repaired/replaced. Aortic valve  regurgitation is not visualized. No aortic stenosis is present. There is a  21 mm Magna Ease valve present in the aortic position. Procedure Date:  02/25/2016. Aortic valve mean gradient  measures 9.0 mmHg.   8. The inferior vena cava is normal in size  with greater than 50%  respiratory variability, suggesting right atrial pressure of 3 mmHg.   EKG:  EKG is  ordered today.  The ekg ordered today demonstrates sinus rhythm with nonspecific T wave changes  Recent Labs: 08/08/2021: ALT 11; BUN 18; Creatinine, Ser 7.08; Hemoglobin 11.7; Platelets 245; Potassium 3.6; Sodium 142  Recent Lipid Panel    Component Value Date/Time   CHOL 147 09/29/2016 0531   TRIG 83 09/29/2016 0531   HDL 66 09/29/2016 0531   CHOLHDL 2.2 09/29/2016 0531   VLDL 17 09/29/2016 0531   LDLCALC 64 09/29/2016 0531   Physical Exam:    VS:  BP 110/64   Pulse 69   Ht 5' (1.524 m)   Wt 169 lb (76.7 kg)   BMI 33.01 kg/m     Wt Readings from Last 3 Encounters:  11/02/21 169 lb (76.7 kg)  05/16/21 175 lb (79.4 kg)  12/06/20 168 lb (76.2 kg)     GEN:  Well nourished, well developed in no acute distress HEENT: Normal NECK: No JVD; No carotid bruits LYMPHATICS: No lymphadenopathy CARDIAC: RRR, + murmurs, rubs, gallops RESPIRATORY:  Clear to auscultation without rales, wheezing or rhonchi  ABDOMEN: Soft, non-tender, non-distended MUSCULOSKELETAL:  No edema; No deformity  SKIN: Warm and dry NEUROLOGIC:  Alert and oriented x 3 PSYCHIATRIC:  Normal affect   ASSESSMENT AND PLAN:    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 Severe TR -Severe on recent echocardiogram.  [facility]-up with surgeon later this month as a part of kidney transplant work-up.  3.  End-stage renal disease on hemodialysis Monday Wednesday Friday -[facility] Medication Adjustments/Labs and Tests Ordered: Current medicines are reviewed at length with the patient today.  Concerns regarding medicines are outlined above.  Orders Placed This Encounter  Procedures    EKG 12-Lead   No orders of the defined types were placed in this encounter.   Patient Instructions  Medication Instructions:  Your physician recommends that you continue on your current medications as directed. Please refer to the Current Medication list given to you today. *If you need a refill on your cardiac medications before your next appointment, please call your pharmacy*   Lab Work: None ordered If you have labs (blood work) drawn today and your tests are completely normal, you will receive your results only by: MyHayesif you have MyChart) OR A paper copy in the mail If you have any lab test that is abnormal or we need to change your treatment, we will call you to review the results.   Testing/Procedures: None ordered   Follow-Up: At [facility] and your health needs are our priority.  As part of our continuing mission to provide you with exceptional heart care, we have created designated Provider Care Teams.  These Care Teams include your primary Cardiologist (physician) and Advanced Practice Providers (APPs -  Physician Assistants and Nurse Practitioners) who all work together to provide you with the care you need, when you need it.  We recommend signing up for the patient portal called "MyChart".  Sign up information is provided on this After Visit Summary.  MyChart is used to connect with patients for Virtual Visits (Telemedicine).  Patients are able to view lab/test results, encounter notes, upcoming appointments, etc.  Non-urgent messages can be sent to your provider as well.   To learn more about what you can do with MyChart, go to [email or web address]   Your next appointment:   1 year(s)  The format for your next appointment:   In Person  Provider:  Fransico Him, MD     Other Instructions   Important Information About Sugar         Jarrett Soho, [place]  11/02/2021 11:25 AM    [facility]  HeartCare

## 2021-11-03 DIAGNOSIS — D631 Anemia in chronic kidney disease: Secondary | ICD-10-CM | POA: Diagnosis not present

## 2021-11-03 DIAGNOSIS — N186 End stage renal disease: Secondary | ICD-10-CM | POA: Diagnosis not present

## 2021-11-03 DIAGNOSIS — N2581 Secondary hyperparathyroidism of renal origin: Secondary | ICD-10-CM | POA: Diagnosis not present

## 2021-11-03 DIAGNOSIS — D509 Iron deficiency anemia, unspecified: Secondary | ICD-10-CM | POA: Diagnosis not present

## 2021-11-03 DIAGNOSIS — Z992 Dependence on renal dialysis: Secondary | ICD-10-CM | POA: Diagnosis not present

## 2021-11-06 DIAGNOSIS — D509 Iron deficiency anemia, unspecified: Secondary | ICD-10-CM | POA: Diagnosis not present

## 2021-11-06 DIAGNOSIS — D631 Anemia in chronic kidney disease: Secondary | ICD-10-CM | POA: Diagnosis not present

## 2021-11-06 DIAGNOSIS — N2581 Secondary hyperparathyroidism of renal origin: Secondary | ICD-10-CM | POA: Diagnosis not present

## 2021-11-06 DIAGNOSIS — N186 End stage renal disease: Secondary | ICD-10-CM | POA: Diagnosis not present

## 2021-11-06 DIAGNOSIS — Z992 Dependence on renal dialysis: Secondary | ICD-10-CM | POA: Diagnosis not present

## 2021-11-07 DIAGNOSIS — N186 End stage renal disease: Secondary | ICD-10-CM | POA: Diagnosis not present

## 2021-11-08 DIAGNOSIS — Z992 Dependence on renal dialysis: Secondary | ICD-10-CM | POA: Diagnosis not present

## 2021-11-08 DIAGNOSIS — D631 Anemia in chronic kidney disease: Secondary | ICD-10-CM | POA: Diagnosis not present

## 2021-11-08 DIAGNOSIS — N186 End stage renal disease: Secondary | ICD-10-CM | POA: Diagnosis not present

## 2021-11-08 DIAGNOSIS — N2581 Secondary hyperparathyroidism of renal origin: Secondary | ICD-10-CM | POA: Diagnosis not present

## 2021-11-08 DIAGNOSIS — D509 Iron deficiency anemia, unspecified: Secondary | ICD-10-CM | POA: Diagnosis not present

## 2021-11-09 ENCOUNTER — Encounter (HOSPITAL_COMMUNITY): Payer: Self-pay | Admitting: Emergency Medicine

## 2021-11-09 ENCOUNTER — Ambulatory Visit (HOSPITAL_COMMUNITY)
Admission: EM | Admit: 2021-11-09 | Discharge: 2021-11-09 | Disposition: A | Discharge status: Home / Self Care | Payer: Medicare Other | Attending: Family Medicine | Admitting: Family Medicine

## 2021-11-09 ENCOUNTER — Other Ambulatory Visit: Payer: Self-pay

## 2021-11-09 DIAGNOSIS — N760 Acute vaginitis: Secondary | ICD-10-CM | POA: Diagnosis not present

## 2021-11-09 DIAGNOSIS — R1030 Lower abdominal pain, unspecified: Secondary | ICD-10-CM | POA: Diagnosis not present

## 2021-11-09 DIAGNOSIS — I959 Hypotension, unspecified: Secondary | ICD-10-CM | POA: Insufficient documentation

## 2021-11-09 DIAGNOSIS — R42 Dizziness and giddiness: Secondary | ICD-10-CM | POA: Diagnosis not present

## 2021-11-09 LAB — CBC WITH DIFFERENTIAL/PLATELET
Abs Immature Granulocytes: 0.02 10*3/uL (ref 0.00–0.07)
Basophils Absolute: 0 10*3/uL (ref 0.0–0.1)
Basophils Relative: 1 %
Eosinophils Absolute: 0.3 10*3/uL (ref 0.0–0.5)
Eosinophils Relative: 6 %
HCT: 32.8 % — ABNORMAL LOW (ref 36.0–46.0)
Hemoglobin: 10.6 g/dL — ABNORMAL LOW (ref 12.0–15.0)
Immature Granulocytes: 0 %
Lymphocytes Relative: 23 %
Lymphs Abs: 1.3 10*3/uL (ref 0.7–4.0)
MCH: 30.1 pg (ref 26.0–34.0)
MCHC: 32.3 g/dL (ref 30.0–36.0)
MCV: 93.2 fL (ref 80.0–100.0)
Monocytes Absolute: 0.5 10*3/uL (ref 0.1–1.0)
Monocytes Relative: 8 %
Neutro Abs: 3.6 10*3/uL (ref 1.7–7.7)
Neutrophils Relative %: 62 %
Platelets: 236 10*3/uL (ref 150–400)
RBC: 3.52 MIL/uL — ABNORMAL LOW (ref 3.87–5.11)
RDW: 17.2 % — ABNORMAL HIGH (ref 11.5–15.5)
WBC: 5.8 10*3/uL (ref 4.0–10.5)
nRBC: 0 % (ref 0.0–0.2)

## 2021-11-09 LAB — SEDIMENTATION RATE: Sed Rate: 10 mm/hr (ref 0–22)

## 2021-11-09 MED ORDER — FLUCONAZOLE 150 MG PO TABS: ORAL_TABLET | ORAL | 0 refills | Status: DC

## 2021-11-09 NOTE — Discharge Instructions (Addendum)
You were seen today for several issues.  I am treating you today for vaginal yeast infection given some of your symptoms.  Your vaginal swab will be resulted tomorrow and we will treat anything else if needed.  If you have worsening abdominal pain, along with diarrhea, nausea or vomiting then please go to the ER for evaluation.  I have ordered lab work for you as well given some of your other symptoms.  These will be resulted later today or tomorrow. We will call you with results, especially if anything is abnormal, and decide next steps.

## 2021-11-09 NOTE — ED Triage Notes (Signed)
Patient thinks she is having a yeast infection abdominal pain (lower) is hurting.  This has been going on for 2 weeks.  Complains of random joint pain.  Patient is a dialysis patient.    Patient wants a test for sepsis.  Reports temperature is 98 and it is usually 96 per patient.

## 2021-11-09 NOTE — ED Provider Notes (Signed)
[place]    CSN: [id number] Arrival date & time: 11/09/21  0805      History   Chief Complaint Chief Complaint  Patient presents with   Abdominal Pain   Vaginitis    HPI [name] Marks is a 66 y.o. female.   Yesterday when she woke up she was feeling a bit dizzy, but did not know why.  She went to dialysis, but not worse.  She states she gets dizzy often, but yesterday was a bit worse.  Little better today.  She has not eaten anything today, but drinking okay.  She takes midodrine to keep her bp up, and took this today.   She has been having some lower abdominal pain, and thinks it may have a yeast infection.  She has this off/on.  She used monistat without help.  Pain is located in the lower abdomen.  That started a long time ago, she has this off/on for a while.  She is having vaginal d/c and itching.  She does see gynecology, and given pills, but the script ran out and used the monistat instead.  No risk for std.  No n/v.  She does not make urine at all (on dialysis).  No h/o UTI's.  Apt with ob/gyn next month.   She is having night sweats.  One moment hot/cold.  She does have h/o "sepsis" in the past without knowing it.        Past Medical History:  Diagnosis Date   Anemia    Aortic valve prosthesis present 02/25/2016   Arthritis    "knees" (01/03/2017)   AVD (aortic valve disease) 07/12/2016   Backache 12/06/2008   Bacteremia due to coagulase-negative Staphylococcus    Carpal tunnel syndrome    Cerebral embolism with transient ischemic attack (TIA)    Cholelithiases 01/28/2017   Chronic female pelvic pain 05/19/5571   Complication of anesthesia    01/01/17- '"a long time ago, difficulty breathimg, not sure if it was due to anesthesia or not."   CVA (cerebral vascular accident) ([place]) 05/16/2016   End stage renal disease on dialysis ([place])    "MWF; [place]." (01/03/2017)   Endocarditis    Esophageal reflux 12/06/2008   GERD (gastroesophageal  reflux disease)    Gout    History of blood transfusion 2017   "related to blood poison" (01/03/2017)   Increased endometrial stripe thickness 11/03/2015   Neck pain    Osteoporosis 09/2016   T score -2.6   Pain and swelling of right upper extremity 12/20/2015   Pulmonary HTN ([place]) 07/12/2016   Renal dialysis device, implant, or graft complication 22/03/5425   Renal insufficiency    S/P cholecystectomy 02/14/2017   Septic shock ([place])    Staphylococcus aureus bacteremia    TIA (transient ischemic attack)    "several" (01/03/2017)    Patient Active Problem List   Diagnosis Date Noted   Personal history of colonic polyps 05/16/2021   Tooth, broken 12/18/2018   Hypotension 10/03/2018   Orthostatic hypotension 03/14/2018   S/P cholecystectomy 02/14/2017   Osteoporosis 12/11/2016   Cholelithiasis 11/07/2016   Cerebral embolism with transient ischemic attack (TIA)    AVD (aortic valve disease) 07/12/2016   Pulmonary HTN ([place]) 07/12/2016   Carpal tunnel syndrome 07/05/2016   Disorder of both mastoids 05/29/2016   CVA (cerebral vascular accident) ([place]) 05/16/2016   History of aortic valve replacement with bioprosthetic valve 05/08/2016   Bacteremia due to coagulase-negative Staphylococcus    History of  endocarditis    Anemia    Complex endometrial hyperplasia with atypia 11/08/2015   Dependence on renal dialysis ([place]) 12/15/2014   Obesity 10/28/2014   Right carotid bruit 01/08/2013   Generalized headaches 01/08/2013   Chronic female pelvic pain 08/08/2012   Gout, unspecified 12/06/2008   Esophageal reflux 12/06/2008   Backache 12/06/2008   FIBROIDS, UTERUS 11/24/2008   ESRD (end stage renal disease) on dialysis ([place]) 07/01/2006   Anemia in chronic kidney disease 12/18/2001    Past Surgical History:  Procedure Laterality Date   AORTIC VALVE REPLACEMENT N/A 02/25/2016   Procedure: AORTIC VALVE REPLACEMENT (AVR) implanted with Magna Ease Aortic valve size 34m;  Surgeon: SMelrose Nakayama MD;  [facility]  Service: Open Heart Surgery;  Laterality: N/A;   AV FISTULA PLACEMENT Left 01/03/2017   Procedure: INSERTION OF ARTERIOVENOUS (AV) GORE-TEX GRAFT LEFT THIGH;  Surgeon: BSerafina Mitchell MD;  [facility]  Service: Vascular;  Laterality: Left;   AArthurRight 02/17/2016   Procedure: REMOVAL OF TWO ARTERIOVENOUS GORETEX GRAFTS (AD[place]mit;  Surgeon: CAngelia Mould MD;  [facility]  Service: Vascular;  Laterality: Right;   BIOPSY  05/16/2021   Procedure: BIOPSY;  Surgeon: SWilford Corner MD;  [facility];  Service: Endoscopy;;   BREAST BIOPSY Left 2013   stereo    BREAST BIOPSY Right 2011   stereo    CARDIAC VALVE REPLACEMENT     CHOLECYSTECTOMY N/A 01/30/2017   Procedure: LAPAROSCOPIC CHOLECYSTECTOMY;  Surgeon: CErroll Luna MD;  [facility]  Service: General;  Laterality: N/A;   COLONOSCOPY W/ POLYPECTOMY     COLONOSCOPY WITH PROPOFOL N/A 05/16/2021   Procedure: COLONOSCOPY WITH PROPOFOL;  Surgeon: SWilford Corner MD;  [facility];  Service: Endoscopy;  Laterality: N/A;   DG AV DIALYSIS GRAFT DECLOT OR     DILATATION & CURETTAGE/HYSTEROSCOPY WITH TRUECLEAR N/A 11/06/2012   Procedure: DILATATION & CURETTAGE/HYSTEROSCOPY WITH TRUECLEAR;  Surgeon: JTerrance Mass MD;  [facility];  Service: Gynecology;  Laterality: N/A;  Truclear Resectoscopic Polypectomy    INSERTION OF DIALYSIS CATHETER N/A 02/19/2016   Procedure: INSERTION OF Left Internal Jugular DIALYSIS CATHETER;  Surgeon: CAngelia Mould MD;  [facility]  Service: Vascular;  Laterality: N/A;   LOOP RECORDER INSERTION N/A 10/01/2016   Procedure: LOOP RECORDER INSERTION;  Surgeon: CConstance Haw MD;  [facility];  Service: Cardiovascular;  Laterality: N/A;   PATCH ANGIOPLASTY Right 02/17/2016   Procedure: PATCH ANGIOPLASTY;  Surgeon: CAngelia Mould MD;  [facility]  Service: Vascular;  Laterality:  Right;   PERIPHERAL VASCULAR CATHETERIZATION N/A 09/14/2014   Procedure: A/V Shuntogram/Fistulagram;  Surgeon: GKatha Cabal MD;  [facility];  Service: Cardiovascular;  Laterality: N/A;   PERIPHERAL VASCULAR CATHETERIZATION N/A 09/14/2014   Procedure: A/V Shunt Intervention;  Surgeon: GKatha Cabal MD;  [facility];  Service: Cardiovascular;  Laterality: N/A;   PERIPHERAL VASCULAR CATHETERIZATION Right 12/09/2014   Procedure: A/V Shuntogram/Fistulagram;  Surgeon: JAlgernon Huxley MD;  [facility];  Service: Cardiovascular;  Laterality: Right;   PERIPHERAL VASCULAR CATHETERIZATION N/A 12/09/2014   Procedure: A/V Shunt Intervention;  Surgeon: JAlgernon Huxley MD;  [facility];  Service: Cardiovascular;  Laterality: N/A;   PERIPHERAL VASCULAR CATHETERIZATION Right 05/24/2015   Procedure: A/V Shuntogram;  Surgeon: VSerafina Mitchell MD;  [facility];  Service: Cardiovascular;  Laterality: Right;  PERIPHERAL VASCULAR CATHETERIZATION Right 05/24/2015   Procedure: Peripheral Vascular Balloon Angioplasty;  Surgeon: Serafina Mitchell, MD;  Location: [place] [facility];  Service: Cardiovascular;  Laterality: Right;  right arm shunt   PERIPHERAL VASCULAR CATHETERIZATION N/A 06/13/2015   Procedure: A/V Shuntogram/Fistulagram;  Surgeon: Algernon Huxley, MD;  Location: [place] [facility];  Service: Cardiovascular;  Laterality: N/A;   PERIPHERAL VASCULAR CATHETERIZATION N/A 06/13/2015   Procedure: A/V Shunt Intervention;  Surgeon: Algernon Huxley, MD;  Location: [place] [facility];  Service: Cardiovascular;  Laterality: N/A;   TEE WITHOUT CARDIOVERSION N/A 02/22/2016   Procedure: TRANSESOPHAGEAL ECHOCARDIOGRAM (TEE);  Surgeon: Pixie Casino, MD;  [facility];  Service: Cardiovascular;  Laterality: N/A;   TEE WITHOUT CARDIOVERSION N/A 02/25/2016   Procedure: TRANSESOPHAGEAL ECHOCARDIOGRAM (TEE);  Surgeon: Melrose Nakayama, MD;  Location: [place];  Service: Open Heart Surgery;  Laterality: N/A;   TEE WITHOUT CARDIOVERSION N/A 10/01/2016   Procedure: TRANSESOPHAGEAL ECHOCARDIOGRAM (TEE);  Surgeon: Pixie Casino, MD;  Location: [facility] ENDOSCOPY;  Service: Cardiovascular;  Laterality: N/A;   TUBAL LIGATION  1983    OB History     Gravida  2   Para  2   Term      Preterm      AB      Living  2      SAB      IAB      Ectopic      Multiple      Live Births               Home Medications    Prior to Admission medications   Medication Sig Start Date End Date Taking? Authorizing Provider  acetaminophen (TYLENOL) 500 MG tablet Take 500-1,000 mg by mouth every 6 (six) hours as needed (for pain.).    [provider]  alendronate (FOSAMAX) 70 MG tablet Take 1 tablet (70 mg total) by mouth every 7 (seven) days. Take with a full glass of water on an empty stomach. 03/16/21   Princess Bruins, MD  cinacalcet (SENSIPAR) 90 MG tablet Take 90 mg by mouth at bedtime.    [provider]  clopidogrel (PLAVIX) 75 MG tablet Take 1 tablet (75 mg total) by mouth daily. Patient taking differently: Take 75 mg by mouth every evening. 01/23/17   Mikell, Jeani Sow, MD  famotidine (PEPCID) 20 MG tablet Take 20 mg by mouth daily after supper.    [provider]  midodrine (PROAMATINE) 10 MG tablet Take 10 mg by mouth See admin instructions. Take 1 tablet (10 mg) by mouth before dialysis & take 1 tablet (10 mg) while at dialysis on Mondays, Wednesdays & Fridays. 08/15/20   [provider]  Multiple Vitamins-Minerals (PRORENAL QD PO) Take 1 tablet by mouth at bedtime.     [provider]  polyethylene glycol-electrolytes (NULYTELY) 420 g solution Take 4,000 mLs by mouth as directed. 02/07/21   [provider]  sevelamer carbonate (RENVELA) 800 MG tablet Take 2,400 mg by mouth See admin instructions. Take 3 tablets (2400 mg) by mouth after each meal and  after each snack    [provider]    Family History Family History  Problem Relation Age of Onset   Diabetes Mother    Hypertension Mother    Heart disease Mother    Alcohol abuse Mother    Kidney disease Mother    Cancer Father        COLON  Alcohol abuse Father    Breast cancer Sister 22   Hypertension Sister    Kidney disease Sister    Hypertension Son    Kidney disease Son     Social History Social History   Tobacco Use   Smoking status: Never   Smokeless tobacco: Never  Vaping Use   Vaping Use: Never used  Substance Use Topics   Alcohol use: No   Drug use: No     Allergies   Iodinated contrast media, Ancef [cefazolin], and Naproxen sodium   Review of Systems Review of Systems  Constitutional:  Positive for appetite change and chills. Negative for fever.  HENT: Negative.    Respiratory: Negative.    Cardiovascular: Negative.   Gastrointestinal:  Positive for abdominal pain. Negative for diarrhea, nausea and vomiting.  Genitourinary: Negative.   Skin: Negative.      Physical Exam Triage Vital Signs ED Triage Vitals  Enc Vitals Group     BP 11/09/21 0838 (!) 86/54     Pulse Rate 11/09/21 0838 (!) 56     Resp 11/09/21 0838 20     Temp 11/09/21 0838 97.8 F (36.6 C)     Temp Source 11/09/21 0838 Oral     SpO2 11/09/21 0838 98 %     Weight --      Height --      Head Circumference --      Peak Flow --      Pain Score 11/09/21 0836 0     Pain Loc --      Pain Edu? --      Excl. in [place]? --    No data found.  Updated Vital Signs BP (!) 86/54 (BP Location: Right Arm) Comment (BP Location): large cuff  Pulse (!) 56   Temp 97.8 F (36.6 C) (Oral)   Resp 20   SpO2 98%   Visual Acuity Right Eye Distance:   Left Eye Distance:   Bilateral Distance:    Right Eye Near:   Left Eye Near:    Bilateral Near:     Physical Exam Constitutional:      Appearance: She is well-developed.  HENT:     Head: Normocephalic.  Cardiovascular:      Rate and Rhythm: Normal rate and regular rhythm.  Pulmonary:     Effort: Pulmonary effort is normal.     Breath sounds: Normal breath sounds.  Abdominal:     General: Bowel sounds are normal.     Palpations: Abdomen is soft.     Tenderness: There is abdominal tenderness in the periumbilical area and suprapubic area. There is no guarding or rebound.  Skin:    General: Skin is warm.  Neurological:     General: No focal deficit present.     Mental Status: She is alert.  Psychiatric:        Mood and Affect: Mood normal.        Behavior: Behavior normal.      UC Treatments / Results  Labs (all labs ordered are listed, but only abnormal results are displayed) Labs Reviewed  CBC WITH DIFFERENTIAL/PLATELET  SEDIMENTATION RATE  CERVICOVAGINAL ANCILLARY ONLY    EKG   Radiology No results found.  Procedures Procedures (including critical care time)  Medications Ordered in UC Medications - No data to display  Initial Impression / Assessment and Plan / UC Course  I have reviewed the triage vital signs and the nursing notes.  Pertinent labs & imaging results that  were available during my care of the patient were reviewed by me and considered in my medical decision making (see chart for details).   Patient was seen today for various complaints.  With her abdominal pain and vaginal d/c I will treat for yeast infection.  She is on dialysis and does not make urine, so unable to do a urine sample.  I have ordered blood work to check for evidence of infection otherwise.  If lab work looks concerning, or if she has worsening symptoms, then please go to the ER for further evaluation.   Final Clinical Impressions(s) / UC Diagnoses   Final diagnoses:  Lower abdominal pain  Vaginitis and vulvovaginitis  Hypotension, unspecified hypotension type  Dizziness     Discharge Instructions      You were seen today for several issues.  I am treating you today for vaginal yeast  infection given some of your symptoms.  Your vaginal swab will be resulted tomorrow and we will treat anything else if needed.  If you have worsening abdominal pain, along with diarrhea, nausea or vomiting then please go to the ER for evaluation.  I have ordered lab work for you as well given some of your other symptoms.  These will be resulted later today or tomorrow. We will call you with results, especially if anything is abnormal, and decide next steps.     ED Prescriptions     Medication Sig Dispense Auth. Provider   fluconazole (DIFLUCAN) 150 MG tablet 1 tab po x 1, may repeat in 3 days 2 tablet Rondel Oh, MD      PDMP not reviewed this encounter.   Rondel Oh, MD 11/09/21 [phone]

## 2021-11-10 DIAGNOSIS — N2581 Secondary hyperparathyroidism of renal origin: Secondary | ICD-10-CM | POA: Diagnosis not present

## 2021-11-10 DIAGNOSIS — Z992 Dependence on renal dialysis: Secondary | ICD-10-CM | POA: Diagnosis not present

## 2021-11-10 DIAGNOSIS — N186 End stage renal disease: Secondary | ICD-10-CM | POA: Diagnosis not present

## 2021-11-10 DIAGNOSIS — D631 Anemia in chronic kidney disease: Secondary | ICD-10-CM | POA: Diagnosis not present

## 2021-11-10 DIAGNOSIS — D509 Iron deficiency anemia, unspecified: Secondary | ICD-10-CM | POA: Diagnosis not present

## 2021-11-10 LAB — CERVICOVAGINAL ANCILLARY ONLY
Bacterial Vaginitis (gardnerella): NEGATIVE
Candida Glabrata: NEGATIVE
Candida Vaginitis: NEGATIVE
Chlamydia: NEGATIVE
Comment: NEGATIVE
Comment: NEGATIVE
Comment: NEGATIVE
Comment: NEGATIVE
Comment: NEGATIVE
Comment: NORMAL
Neisseria Gonorrhea: NEGATIVE
Trichomonas: NEGATIVE

## 2021-11-13 DIAGNOSIS — N186 End stage renal disease: Secondary | ICD-10-CM | POA: Diagnosis not present

## 2021-11-13 DIAGNOSIS — N2581 Secondary hyperparathyroidism of renal origin: Secondary | ICD-10-CM | POA: Diagnosis not present

## 2021-11-13 DIAGNOSIS — Z992 Dependence on renal dialysis: Secondary | ICD-10-CM | POA: Diagnosis not present

## 2021-11-13 DIAGNOSIS — D509 Iron deficiency anemia, unspecified: Secondary | ICD-10-CM | POA: Diagnosis not present

## 2021-11-13 DIAGNOSIS — D631 Anemia in chronic kidney disease: Secondary | ICD-10-CM | POA: Diagnosis not present

## 2021-11-15 DIAGNOSIS — D631 Anemia in chronic kidney disease: Secondary | ICD-10-CM | POA: Diagnosis not present

## 2021-11-15 DIAGNOSIS — Z992 Dependence on renal dialysis: Secondary | ICD-10-CM | POA: Diagnosis not present

## 2021-11-15 DIAGNOSIS — N186 End stage renal disease: Secondary | ICD-10-CM | POA: Diagnosis not present

## 2021-11-15 DIAGNOSIS — N2581 Secondary hyperparathyroidism of renal origin: Secondary | ICD-10-CM | POA: Diagnosis not present

## 2021-11-15 DIAGNOSIS — D509 Iron deficiency anemia, unspecified: Secondary | ICD-10-CM | POA: Diagnosis not present

## 2021-11-17 DIAGNOSIS — N2581 Secondary hyperparathyroidism of renal origin: Secondary | ICD-10-CM | POA: Diagnosis not present

## 2021-11-17 DIAGNOSIS — D509 Iron deficiency anemia, unspecified: Secondary | ICD-10-CM | POA: Diagnosis not present

## 2021-11-17 DIAGNOSIS — Z992 Dependence on renal dialysis: Secondary | ICD-10-CM | POA: Diagnosis not present

## 2021-11-17 DIAGNOSIS — N186 End stage renal disease: Secondary | ICD-10-CM | POA: Diagnosis not present

## 2021-11-17 DIAGNOSIS — D631 Anemia in chronic kidney disease: Secondary | ICD-10-CM | POA: Diagnosis not present

## 2021-11-20 DIAGNOSIS — D509 Iron deficiency anemia, unspecified: Secondary | ICD-10-CM | POA: Diagnosis not present

## 2021-11-20 DIAGNOSIS — N2581 Secondary hyperparathyroidism of renal origin: Secondary | ICD-10-CM | POA: Diagnosis not present

## 2021-11-20 DIAGNOSIS — D631 Anemia in chronic kidney disease: Secondary | ICD-10-CM | POA: Diagnosis not present

## 2021-11-20 DIAGNOSIS — N186 End stage renal disease: Secondary | ICD-10-CM | POA: Diagnosis not present

## 2021-11-20 DIAGNOSIS — Z992 Dependence on renal dialysis: Secondary | ICD-10-CM | POA: Diagnosis not present

## 2021-11-22 DIAGNOSIS — D509 Iron deficiency anemia, unspecified: Secondary | ICD-10-CM | POA: Diagnosis not present

## 2021-11-22 DIAGNOSIS — N186 End stage renal disease: Secondary | ICD-10-CM | POA: Diagnosis not present

## 2021-11-22 DIAGNOSIS — Z992 Dependence on renal dialysis: Secondary | ICD-10-CM | POA: Diagnosis not present

## 2021-11-22 DIAGNOSIS — D631 Anemia in chronic kidney disease: Secondary | ICD-10-CM | POA: Diagnosis not present

## 2021-11-22 DIAGNOSIS — N2581 Secondary hyperparathyroidism of renal origin: Secondary | ICD-10-CM | POA: Diagnosis not present

## 2021-11-24 DIAGNOSIS — N186 End stage renal disease: Secondary | ICD-10-CM | POA: Diagnosis not present

## 2021-11-24 DIAGNOSIS — D631 Anemia in chronic kidney disease: Secondary | ICD-10-CM | POA: Diagnosis not present

## 2021-11-24 DIAGNOSIS — D509 Iron deficiency anemia, unspecified: Secondary | ICD-10-CM | POA: Diagnosis not present

## 2021-11-24 DIAGNOSIS — N2581 Secondary hyperparathyroidism of renal origin: Secondary | ICD-10-CM | POA: Diagnosis not present

## 2021-11-24 DIAGNOSIS — Z992 Dependence on renal dialysis: Secondary | ICD-10-CM | POA: Diagnosis not present

## 2021-11-26 DIAGNOSIS — Z992 Dependence on renal dialysis: Secondary | ICD-10-CM | POA: Diagnosis not present

## 2021-11-26 DIAGNOSIS — N039 Chronic nephritic syndrome with unspecified morphologic changes: Secondary | ICD-10-CM | POA: Diagnosis not present

## 2021-11-26 DIAGNOSIS — N186 End stage renal disease: Secondary | ICD-10-CM | POA: Diagnosis not present

## 2021-11-27 DIAGNOSIS — N2581 Secondary hyperparathyroidism of renal origin: Secondary | ICD-10-CM | POA: Diagnosis not present

## 2021-11-27 DIAGNOSIS — D631 Anemia in chronic kidney disease: Secondary | ICD-10-CM | POA: Diagnosis not present

## 2021-11-27 DIAGNOSIS — Z992 Dependence on renal dialysis: Secondary | ICD-10-CM | POA: Diagnosis not present

## 2021-11-27 DIAGNOSIS — N186 End stage renal disease: Secondary | ICD-10-CM | POA: Diagnosis not present

## 2021-11-27 DIAGNOSIS — Z23 Encounter for immunization: Secondary | ICD-10-CM | POA: Diagnosis not present

## 2021-11-27 DIAGNOSIS — D509 Iron deficiency anemia, unspecified: Secondary | ICD-10-CM | POA: Diagnosis not present

## 2021-11-29 ENCOUNTER — Emergency Department (HOSPITAL_COMMUNITY): Payer: Medicare Other

## 2021-11-29 ENCOUNTER — Emergency Department (HOSPITAL_COMMUNITY)
Admission: EM | Admit: 2021-11-29 | Discharge: 2021-11-29 | Disposition: A | Discharge status: Home / Self Care | Payer: Medicare Other | Attending: Emergency Medicine | Admitting: Emergency Medicine

## 2021-11-29 ENCOUNTER — Other Ambulatory Visit: Payer: Self-pay

## 2021-11-29 ENCOUNTER — Encounter (HOSPITAL_COMMUNITY): Payer: Self-pay

## 2021-11-29 DIAGNOSIS — M25562 Pain in left knee: Secondary | ICD-10-CM | POA: Diagnosis not present

## 2021-11-29 DIAGNOSIS — Y92410 Unspecified street and highway as the place of occurrence of the external cause: Secondary | ICD-10-CM | POA: Insufficient documentation

## 2021-11-29 DIAGNOSIS — Z7901 Long term (current) use of anticoagulants: Secondary | ICD-10-CM | POA: Insufficient documentation

## 2021-11-29 DIAGNOSIS — I700 Atherosclerosis of aorta: Secondary | ICD-10-CM | POA: Diagnosis not present

## 2021-11-29 DIAGNOSIS — Z9049 Acquired absence of other specified parts of digestive tract: Secondary | ICD-10-CM | POA: Insufficient documentation

## 2021-11-29 DIAGNOSIS — S8992XA Unspecified injury of left lower leg, initial encounter: Secondary | ICD-10-CM | POA: Insufficient documentation

## 2021-11-29 DIAGNOSIS — R509 Fever, unspecified: Secondary | ICD-10-CM | POA: Diagnosis not present

## 2021-11-29 DIAGNOSIS — Z8673 Personal history of transient ischemic attack (TIA), and cerebral infarction without residual deficits: Secondary | ICD-10-CM | POA: Insufficient documentation

## 2021-11-29 DIAGNOSIS — I272 Pulmonary hypertension, unspecified: Secondary | ICD-10-CM | POA: Diagnosis not present

## 2021-11-29 DIAGNOSIS — S80919A Unspecified superficial injury of unspecified knee, initial encounter: Secondary | ICD-10-CM | POA: Diagnosis not present

## 2021-11-29 DIAGNOSIS — N186 End stage renal disease: Secondary | ICD-10-CM | POA: Insufficient documentation

## 2021-11-29 DIAGNOSIS — M11262 Other chondrocalcinosis, left knee: Secondary | ICD-10-CM | POA: Diagnosis not present

## 2021-11-29 DIAGNOSIS — Z992 Dependence on renal dialysis: Secondary | ICD-10-CM | POA: Insufficient documentation

## 2021-11-29 DIAGNOSIS — Z041 Encounter for examination and observation following transport accident: Secondary | ICD-10-CM | POA: Diagnosis not present

## 2021-11-29 MED ORDER — ACETAMINOPHEN 500 MG PO TABS
1000.0000 mg | ORAL_TABLET | Freq: Once | ORAL | Status: AC
  Administered 2021-11-29: 1000 mg via ORAL
  Filled 2021-11-29: qty 2
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
## 2021-11-30 DIAGNOSIS — D509 Iron deficiency anemia, unspecified: Secondary | ICD-10-CM | POA: Diagnosis not present

## 2021-11-30 DIAGNOSIS — Z992 Dependence on renal dialysis: Secondary | ICD-10-CM | POA: Diagnosis not present

## 2021-11-30 DIAGNOSIS — Z23 Encounter for immunization: Secondary | ICD-10-CM | POA: Diagnosis not present

## 2021-11-30 DIAGNOSIS — D631 Anemia in chronic kidney disease: Secondary | ICD-10-CM | POA: Diagnosis not present

## 2021-11-30 DIAGNOSIS — N2581 Secondary hyperparathyroidism of renal origin: Secondary | ICD-10-CM | POA: Diagnosis not present

## 2021-11-30 DIAGNOSIS — N186 End stage renal disease: Secondary | ICD-10-CM | POA: Diagnosis not present

## 2021-12-01 DIAGNOSIS — Z23 Encounter for immunization: Secondary | ICD-10-CM | POA: Diagnosis not present

## 2021-12-01 DIAGNOSIS — N2581 Secondary hyperparathyroidism of renal origin: Secondary | ICD-10-CM | POA: Diagnosis not present

## 2021-12-01 DIAGNOSIS — D509 Iron deficiency anemia, unspecified: Secondary | ICD-10-CM | POA: Diagnosis not present

## 2021-12-01 DIAGNOSIS — Z992 Dependence on renal dialysis: Secondary | ICD-10-CM | POA: Diagnosis not present

## 2021-12-01 DIAGNOSIS — N186 End stage renal disease: Secondary | ICD-10-CM | POA: Diagnosis not present

## 2021-12-01 DIAGNOSIS — D631 Anemia in chronic kidney disease: Secondary | ICD-10-CM | POA: Diagnosis not present

## 2021-12-04 DIAGNOSIS — D509 Iron deficiency anemia, unspecified: Secondary | ICD-10-CM | POA: Diagnosis not present

## 2021-12-04 DIAGNOSIS — N186 End stage renal disease: Secondary | ICD-10-CM | POA: Diagnosis not present

## 2021-12-04 DIAGNOSIS — D631 Anemia in chronic kidney disease: Secondary | ICD-10-CM | POA: Diagnosis not present

## 2021-12-04 DIAGNOSIS — Z23 Encounter for immunization: Secondary | ICD-10-CM | POA: Diagnosis not present

## 2021-12-04 DIAGNOSIS — N2581 Secondary hyperparathyroidism of renal origin: Secondary | ICD-10-CM | POA: Diagnosis not present

## 2021-12-04 DIAGNOSIS — Z992 Dependence on renal dialysis: Secondary | ICD-10-CM | POA: Diagnosis not present

## 2021-12-06 DIAGNOSIS — N2581 Secondary hyperparathyroidism of renal origin: Secondary | ICD-10-CM | POA: Diagnosis not present

## 2021-12-06 DIAGNOSIS — D631 Anemia in chronic kidney disease: Secondary | ICD-10-CM | POA: Diagnosis not present

## 2021-12-06 DIAGNOSIS — Z23 Encounter for immunization: Secondary | ICD-10-CM | POA: Diagnosis not present

## 2021-12-06 DIAGNOSIS — D509 Iron deficiency anemia, unspecified: Secondary | ICD-10-CM | POA: Diagnosis not present

## 2021-12-06 DIAGNOSIS — N186 End stage renal disease: Secondary | ICD-10-CM | POA: Diagnosis not present

## 2021-12-06 DIAGNOSIS — Z992 Dependence on renal dialysis: Secondary | ICD-10-CM | POA: Diagnosis not present

## 2021-12-08 DIAGNOSIS — D509 Iron deficiency anemia, unspecified: Secondary | ICD-10-CM | POA: Diagnosis not present

## 2021-12-08 DIAGNOSIS — N2581 Secondary hyperparathyroidism of renal origin: Secondary | ICD-10-CM | POA: Diagnosis not present

## 2021-12-08 DIAGNOSIS — N186 End stage renal disease: Secondary | ICD-10-CM | POA: Diagnosis not present

## 2021-12-08 DIAGNOSIS — Z23 Encounter for immunization: Secondary | ICD-10-CM | POA: Diagnosis not present

## 2021-12-08 DIAGNOSIS — D631 Anemia in chronic kidney disease: Secondary | ICD-10-CM | POA: Diagnosis not present

## 2021-12-08 DIAGNOSIS — Z992 Dependence on renal dialysis: Secondary | ICD-10-CM | POA: Diagnosis not present

## 2021-12-11 DIAGNOSIS — D631 Anemia in chronic kidney disease: Secondary | ICD-10-CM | POA: Diagnosis not present

## 2021-12-11 DIAGNOSIS — N186 End stage renal disease: Secondary | ICD-10-CM | POA: Diagnosis not present

## 2021-12-11 DIAGNOSIS — Z23 Encounter for immunization: Secondary | ICD-10-CM | POA: Diagnosis not present

## 2021-12-11 DIAGNOSIS — N2581 Secondary hyperparathyroidism of renal origin: Secondary | ICD-10-CM | POA: Diagnosis not present

## 2021-12-11 DIAGNOSIS — Z992 Dependence on renal dialysis: Secondary | ICD-10-CM | POA: Diagnosis not present

## 2021-12-11 DIAGNOSIS — D509 Iron deficiency anemia, unspecified: Secondary | ICD-10-CM | POA: Diagnosis not present

## 2021-12-13 DIAGNOSIS — D509 Iron deficiency anemia, unspecified: Secondary | ICD-10-CM | POA: Diagnosis not present

## 2021-12-13 DIAGNOSIS — N186 End stage renal disease: Secondary | ICD-10-CM | POA: Diagnosis not present

## 2021-12-13 DIAGNOSIS — Z992 Dependence on renal dialysis: Secondary | ICD-10-CM | POA: Diagnosis not present

## 2021-12-13 DIAGNOSIS — Z23 Encounter for immunization: Secondary | ICD-10-CM | POA: Diagnosis not present

## 2021-12-13 DIAGNOSIS — D631 Anemia in chronic kidney disease: Secondary | ICD-10-CM | POA: Diagnosis not present

## 2021-12-13 DIAGNOSIS — N2581 Secondary hyperparathyroidism of renal origin: Secondary | ICD-10-CM | POA: Diagnosis not present

## 2021-12-14 ENCOUNTER — Ambulatory Visit (INDEPENDENT_AMBULATORY_CARE_PROVIDER_SITE_OTHER): Payer: Medicare Other | Admitting: Obstetrics & Gynecology

## 2021-12-14 ENCOUNTER — Encounter: Payer: Self-pay | Admitting: Obstetrics & Gynecology

## 2021-12-14 VITALS — BP 116/64 | HR 65 | Ht 59.75 in | Wt 169.0 lb

## 2021-12-14 DIAGNOSIS — Z01419 Encounter for gynecological examination (general) (routine) without abnormal findings: Secondary | ICD-10-CM | POA: Diagnosis not present

## 2021-12-14 DIAGNOSIS — Z780 Asymptomatic menopausal state: Secondary | ICD-10-CM

## 2021-12-14 DIAGNOSIS — Z992 Dependence on renal dialysis: Secondary | ICD-10-CM

## 2021-12-14 DIAGNOSIS — M810 Age-related osteoporosis without current pathological fracture: Secondary | ICD-10-CM

## 2021-12-14 DIAGNOSIS — N186 End stage renal disease: Secondary | ICD-10-CM

## 2021-12-14 NOTE — Progress Notes (Signed)
[name] Marks [birth year] [id number]   History:    66 y.o. G2P2L2 Widowed.  4 grand-children.   RP:  Estalbished patient presenting for annual gyn exam    HPI: Postmenopause, well on no HRT.  No PMB.  H/O Simple Endometrial Hyperplasia on Megace. EBx benign 02/2018, Megace stopped. Pelvic US 09/2018 showed a thin Endometrial line at 2.6 mm.  No pelvic pain.  Abstinent.  Pap Neg 11/2019.  Will repeat Pap at 3 years.  Urine/BMs normal.  Breasts normal.  Mammo Neg 02/2021.  BMI 33.09.  Needs to exercise more. Health labs with Fam MD.  Harriet Masson 04/2021.  Kidney failure on Hemodialysis 3x a week.  BD Osteoporosis 12/2020.  Fosamax stopped by Nephrologist.  Taking Ca++ and Vit D supplements.    Past medical history,surgical history, family history and social history were all reviewed and documented in the EPIC chart.  Gynecologic History No LMP recorded. Patient is postmenopausal.  Obstetric History OB History  Gravida Para Term Preterm AB Living  '2 2 2     2  '$ SAB IAB Ectopic Multiple Live Births               # Outcome Date GA Lbr Len/2nd Weight Sex Delivery Anes PTL Lv  2 Term           1 Term              ROS: A ROS was performed and pertinent positives and negatives are included in the history. GENERAL: No fevers or chills. HEENT: No change in vision, no earache, sore throat or sinus congestion. NECK: No pain or stiffness. CARDIOVASCULAR: No chest pain or pressure. No palpitations. PULMONARY: No shortness of breath, cough or wheeze. GASTROINTESTINAL: No abdominal pain, nausea, vomiting or diarrhea, melena or bright red blood per rectum. GENITOURINARY: No urinary frequency, urgency, hesitancy or dysuria. MUSCULOSKELETAL: No joint or muscle pain, no back pain, no recent trauma. DERMATOLOGIC: No rash, no itching, no lesions. ENDOCRINE: No polyuria, polydipsia, no heat or cold intolerance. No recent change in weight. HEMATOLOGICAL: No anemia or easy bruising or bleeding. NEUROLOGIC: No  headache, seizures, numbness, tingling or weakness. PSYCHIATRIC: No depression, no loss of interest in normal activity or change in sleep pattern.     Exam:   BP 116/64   Pulse 65   Ht 4' 11.75" (1.518 m)   Wt 169 lb (76.7 kg)   SpO2 98%   BMI 33.28 kg/m   Body mass index is 33.28 kg/m.  General appearance : Well developed well nourished female. No acute distress HEENT: Eyes: no retinal hemorrhage or exudates,  Neck supple, trachea midline, no carotid bruits, no thyroidmegaly Lungs: Clear to auscultation, no rhonchi or wheezes, or rib retractions  Heart: Regular rate and rhythm, no murmurs or gallops Breast:Examined in sitting and supine position were symmetrical in appearance, no palpable masses or tenderness,  no skin retraction, no nipple inversion, no nipple discharge, no skin discoloration, no axillary or supraclavicular lymphadenopathy Abdomen: no palpable masses or tenderness, no rebound or guarding Extremities: no edema or skin discoloration or tenderness  Pelvic: Vulva: Normal             Vagina: No gross lesions or discharge  Cervix: No gross lesions or discharge  Uterus  AV, normal size, shape and consistency, non-tender and mobile  Adnexa  Without masses or tenderness  Anus: Normal   Assessment/Plan:  66 y.o. female for annual exam   1. Well female exam with routine  gynecological exam Postmenopause, well on no HRT.  No PMB.  H/O Simple Endometrial Hyperplasia on Megace. EBx benign 02/2018, Megace stopped. Pelvic US 09/2018 showed a thin Endometrial line at 2.6 mm.  No pelvic pain.  Abstinent.  Pap Neg 11/2019.  Will repeat Pap at 3 years.  Urine/BMs normal.  Breasts normal.  Mammo Neg 02/2021.  BMI 33.09.  Needs to exercise more. Health labs with Fam MD.  Harriet Masson 04/2021.  Kidney failure on Hemodialysis 3x a week.  BD Osteoporosis 12/2020.  Fosamax stopped by Nephrologist.  Taking Ca++ and Vit D supplements.   2. Postmenopause Postmenopause, well on no HRT.  No PMB.  H/O  Simple Endometrial Hyperplasia on Megace. EBx benign 02/2018, Megace stopped. Pelvic US 09/2018 showed a thin Endometrial line at 2.6 mm.  No pelvic pain.  Abstinent.   3. Age-related osteoporosis without current pathological fracture BD Osteoporosis 12/2020.  Fosamax stopped by Nephrologist.  Taking Ca++ and Vit D supplements. Will repeat BD in 12/2022.   4. ESRD (end stage renal disease) on dialysis [facility])   Princess Bruins MD, 2:10 PM 12/14/2021

## 2021-12-15 DIAGNOSIS — N2581 Secondary hyperparathyroidism of renal origin: Secondary | ICD-10-CM | POA: Diagnosis not present

## 2021-12-15 DIAGNOSIS — D509 Iron deficiency anemia, unspecified: Secondary | ICD-10-CM | POA: Diagnosis not present

## 2021-12-15 DIAGNOSIS — Z992 Dependence on renal dialysis: Secondary | ICD-10-CM | POA: Diagnosis not present

## 2021-12-15 DIAGNOSIS — N186 End stage renal disease: Secondary | ICD-10-CM | POA: Diagnosis not present

## 2021-12-15 DIAGNOSIS — D631 Anemia in chronic kidney disease: Secondary | ICD-10-CM | POA: Diagnosis not present

## 2021-12-15 DIAGNOSIS — Z23 Encounter for immunization: Secondary | ICD-10-CM | POA: Diagnosis not present

## 2021-12-18 DIAGNOSIS — Z992 Dependence on renal dialysis: Secondary | ICD-10-CM | POA: Diagnosis not present

## 2021-12-18 DIAGNOSIS — Z23 Encounter for immunization: Secondary | ICD-10-CM | POA: Diagnosis not present

## 2021-12-18 DIAGNOSIS — D509 Iron deficiency anemia, unspecified: Secondary | ICD-10-CM | POA: Diagnosis not present

## 2021-12-18 DIAGNOSIS — N2581 Secondary hyperparathyroidism of renal origin: Secondary | ICD-10-CM | POA: Diagnosis not present

## 2021-12-18 DIAGNOSIS — D631 Anemia in chronic kidney disease: Secondary | ICD-10-CM | POA: Diagnosis not present

## 2021-12-18 DIAGNOSIS — N186 End stage renal disease: Secondary | ICD-10-CM | POA: Diagnosis not present

## 2021-12-20 DIAGNOSIS — Z992 Dependence on renal dialysis: Secondary | ICD-10-CM | POA: Diagnosis not present

## 2021-12-20 DIAGNOSIS — N2581 Secondary hyperparathyroidism of renal origin: Secondary | ICD-10-CM | POA: Diagnosis not present

## 2021-12-20 DIAGNOSIS — D631 Anemia in chronic kidney disease: Secondary | ICD-10-CM | POA: Diagnosis not present

## 2021-12-20 DIAGNOSIS — Z23 Encounter for immunization: Secondary | ICD-10-CM | POA: Diagnosis not present

## 2021-12-20 DIAGNOSIS — N186 End stage renal disease: Secondary | ICD-10-CM | POA: Diagnosis not present

## 2021-12-20 DIAGNOSIS — D509 Iron deficiency anemia, unspecified: Secondary | ICD-10-CM | POA: Diagnosis not present

## 2021-12-22 DIAGNOSIS — N186 End stage renal disease: Secondary | ICD-10-CM | POA: Diagnosis not present

## 2021-12-22 DIAGNOSIS — N2581 Secondary hyperparathyroidism of renal origin: Secondary | ICD-10-CM | POA: Diagnosis not present

## 2021-12-22 DIAGNOSIS — Z23 Encounter for immunization: Secondary | ICD-10-CM | POA: Diagnosis not present

## 2021-12-22 DIAGNOSIS — Z992 Dependence on renal dialysis: Secondary | ICD-10-CM | POA: Diagnosis not present

## 2021-12-22 DIAGNOSIS — D509 Iron deficiency anemia, unspecified: Secondary | ICD-10-CM | POA: Diagnosis not present

## 2021-12-22 DIAGNOSIS — D631 Anemia in chronic kidney disease: Secondary | ICD-10-CM | POA: Diagnosis not present

## 2021-12-25 DIAGNOSIS — Z23 Encounter for immunization: Secondary | ICD-10-CM | POA: Diagnosis not present

## 2021-12-25 DIAGNOSIS — N2581 Secondary hyperparathyroidism of renal origin: Secondary | ICD-10-CM | POA: Diagnosis not present

## 2021-12-25 DIAGNOSIS — D631 Anemia in chronic kidney disease: Secondary | ICD-10-CM | POA: Diagnosis not present

## 2021-12-25 DIAGNOSIS — Z992 Dependence on renal dialysis: Secondary | ICD-10-CM | POA: Diagnosis not present

## 2021-12-25 DIAGNOSIS — D509 Iron deficiency anemia, unspecified: Secondary | ICD-10-CM | POA: Diagnosis not present

## 2021-12-25 DIAGNOSIS — N186 End stage renal disease: Secondary | ICD-10-CM | POA: Diagnosis not present

## 2021-12-27 DIAGNOSIS — N2581 Secondary hyperparathyroidism of renal origin: Secondary | ICD-10-CM | POA: Diagnosis not present

## 2021-12-27 DIAGNOSIS — N039 Chronic nephritic syndrome with unspecified morphologic changes: Secondary | ICD-10-CM | POA: Diagnosis not present

## 2021-12-27 DIAGNOSIS — N186 End stage renal disease: Secondary | ICD-10-CM | POA: Diagnosis not present

## 2021-12-27 DIAGNOSIS — Z992 Dependence on renal dialysis: Secondary | ICD-10-CM | POA: Diagnosis not present

## 2021-12-27 DIAGNOSIS — D631 Anemia in chronic kidney disease: Secondary | ICD-10-CM | POA: Diagnosis not present

## 2021-12-27 DIAGNOSIS — D509 Iron deficiency anemia, unspecified: Secondary | ICD-10-CM | POA: Diagnosis not present

## 2021-12-28 DIAGNOSIS — I871 Compression of vein: Secondary | ICD-10-CM | POA: Diagnosis not present

## 2021-12-28 DIAGNOSIS — T82858A Stenosis of vascular prosthetic devices, implants and grafts, initial encounter: Secondary | ICD-10-CM | POA: Diagnosis not present

## 2021-12-28 DIAGNOSIS — N186 End stage renal disease: Secondary | ICD-10-CM | POA: Diagnosis not present

## 2021-12-28 DIAGNOSIS — Z992 Dependence on renal dialysis: Secondary | ICD-10-CM | POA: Diagnosis not present

## 2021-12-29 DIAGNOSIS — N186 End stage renal disease: Secondary | ICD-10-CM | POA: Diagnosis not present

## 2021-12-29 DIAGNOSIS — Z992 Dependence on renal dialysis: Secondary | ICD-10-CM | POA: Diagnosis not present

## 2021-12-29 DIAGNOSIS — D509 Iron deficiency anemia, unspecified: Secondary | ICD-10-CM | POA: Diagnosis not present

## 2021-12-29 DIAGNOSIS — N2581 Secondary hyperparathyroidism of renal origin: Secondary | ICD-10-CM | POA: Diagnosis not present

## 2021-12-29 DIAGNOSIS — D631 Anemia in chronic kidney disease: Secondary | ICD-10-CM | POA: Diagnosis not present

## 2022-01-01 DIAGNOSIS — N186 End stage renal disease: Secondary | ICD-10-CM | POA: Diagnosis not present

## 2022-01-01 DIAGNOSIS — D509 Iron deficiency anemia, unspecified: Secondary | ICD-10-CM | POA: Diagnosis not present

## 2022-01-01 DIAGNOSIS — D631 Anemia in chronic kidney disease: Secondary | ICD-10-CM | POA: Diagnosis not present

## 2022-01-01 DIAGNOSIS — Z992 Dependence on renal dialysis: Secondary | ICD-10-CM | POA: Diagnosis not present

## 2022-01-01 DIAGNOSIS — N2581 Secondary hyperparathyroidism of renal origin: Secondary | ICD-10-CM | POA: Diagnosis not present

## 2022-01-03 DIAGNOSIS — N2581 Secondary hyperparathyroidism of renal origin: Secondary | ICD-10-CM | POA: Diagnosis not present

## 2022-01-03 DIAGNOSIS — D631 Anemia in chronic kidney disease: Secondary | ICD-10-CM | POA: Diagnosis not present

## 2022-01-03 DIAGNOSIS — D509 Iron deficiency anemia, unspecified: Secondary | ICD-10-CM | POA: Diagnosis not present

## 2022-01-03 DIAGNOSIS — Z992 Dependence on renal dialysis: Secondary | ICD-10-CM | POA: Diagnosis not present

## 2022-01-03 DIAGNOSIS — N186 End stage renal disease: Secondary | ICD-10-CM | POA: Diagnosis not present

## 2022-01-05 DIAGNOSIS — N2581 Secondary hyperparathyroidism of renal origin: Secondary | ICD-10-CM | POA: Diagnosis not present

## 2022-01-05 DIAGNOSIS — N186 End stage renal disease: Secondary | ICD-10-CM | POA: Diagnosis not present

## 2022-01-05 DIAGNOSIS — D509 Iron deficiency anemia, unspecified: Secondary | ICD-10-CM | POA: Diagnosis not present

## 2022-01-05 DIAGNOSIS — Z992 Dependence on renal dialysis: Secondary | ICD-10-CM | POA: Diagnosis not present

## 2022-01-05 DIAGNOSIS — D631 Anemia in chronic kidney disease: Secondary | ICD-10-CM | POA: Diagnosis not present

## 2022-01-08 DIAGNOSIS — Z992 Dependence on renal dialysis: Secondary | ICD-10-CM | POA: Diagnosis not present

## 2022-01-08 DIAGNOSIS — N2581 Secondary hyperparathyroidism of renal origin: Secondary | ICD-10-CM | POA: Diagnosis not present

## 2022-01-08 DIAGNOSIS — D631 Anemia in chronic kidney disease: Secondary | ICD-10-CM | POA: Diagnosis not present

## 2022-01-08 DIAGNOSIS — N186 End stage renal disease: Secondary | ICD-10-CM | POA: Diagnosis not present

## 2022-01-08 DIAGNOSIS — D509 Iron deficiency anemia, unspecified: Secondary | ICD-10-CM | POA: Diagnosis not present

## 2022-01-10 DIAGNOSIS — D631 Anemia in chronic kidney disease: Secondary | ICD-10-CM | POA: Diagnosis not present

## 2022-01-10 DIAGNOSIS — N186 End stage renal disease: Secondary | ICD-10-CM | POA: Diagnosis not present

## 2022-01-10 DIAGNOSIS — N2581 Secondary hyperparathyroidism of renal origin: Secondary | ICD-10-CM | POA: Diagnosis not present

## 2022-01-10 DIAGNOSIS — Z992 Dependence on renal dialysis: Secondary | ICD-10-CM | POA: Diagnosis not present

## 2022-01-10 DIAGNOSIS — D509 Iron deficiency anemia, unspecified: Secondary | ICD-10-CM | POA: Diagnosis not present

## 2022-01-12 DIAGNOSIS — D509 Iron deficiency anemia, unspecified: Secondary | ICD-10-CM | POA: Diagnosis not present

## 2022-01-12 DIAGNOSIS — D631 Anemia in chronic kidney disease: Secondary | ICD-10-CM | POA: Diagnosis not present

## 2022-01-12 DIAGNOSIS — N186 End stage renal disease: Secondary | ICD-10-CM | POA: Diagnosis not present

## 2022-01-12 DIAGNOSIS — Z992 Dependence on renal dialysis: Secondary | ICD-10-CM | POA: Diagnosis not present

## 2022-01-12 DIAGNOSIS — N2581 Secondary hyperparathyroidism of renal origin: Secondary | ICD-10-CM | POA: Diagnosis not present

## 2022-01-14 DIAGNOSIS — N186 End stage renal disease: Secondary | ICD-10-CM | POA: Diagnosis not present

## 2022-01-14 DIAGNOSIS — D509 Iron deficiency anemia, unspecified: Secondary | ICD-10-CM | POA: Diagnosis not present

## 2022-01-14 DIAGNOSIS — Z992 Dependence on renal dialysis: Secondary | ICD-10-CM | POA: Diagnosis not present

## 2022-01-14 DIAGNOSIS — D631 Anemia in chronic kidney disease: Secondary | ICD-10-CM | POA: Diagnosis not present

## 2022-01-14 DIAGNOSIS — N2581 Secondary hyperparathyroidism of renal origin: Secondary | ICD-10-CM | POA: Diagnosis not present

## 2022-01-15 DIAGNOSIS — G5601 Carpal tunnel syndrome, right upper limb: Secondary | ICD-10-CM | POA: Diagnosis not present

## 2022-01-15 DIAGNOSIS — G5602 Carpal tunnel syndrome, left upper limb: Secondary | ICD-10-CM | POA: Diagnosis not present

## 2022-01-16 DIAGNOSIS — D509 Iron deficiency anemia, unspecified: Secondary | ICD-10-CM | POA: Diagnosis not present

## 2022-01-16 DIAGNOSIS — N2581 Secondary hyperparathyroidism of renal origin: Secondary | ICD-10-CM | POA: Diagnosis not present

## 2022-01-16 DIAGNOSIS — D631 Anemia in chronic kidney disease: Secondary | ICD-10-CM | POA: Diagnosis not present

## 2022-01-16 DIAGNOSIS — N186 End stage renal disease: Secondary | ICD-10-CM | POA: Diagnosis not present

## 2022-01-16 DIAGNOSIS — Z992 Dependence on renal dialysis: Secondary | ICD-10-CM | POA: Diagnosis not present

## 2022-01-19 DIAGNOSIS — D631 Anemia in chronic kidney disease: Secondary | ICD-10-CM | POA: Diagnosis not present

## 2022-01-19 DIAGNOSIS — N2581 Secondary hyperparathyroidism of renal origin: Secondary | ICD-10-CM | POA: Diagnosis not present

## 2022-01-19 DIAGNOSIS — D509 Iron deficiency anemia, unspecified: Secondary | ICD-10-CM | POA: Diagnosis not present

## 2022-01-19 DIAGNOSIS — N186 End stage renal disease: Secondary | ICD-10-CM | POA: Diagnosis not present

## 2022-01-19 DIAGNOSIS — Z992 Dependence on renal dialysis: Secondary | ICD-10-CM | POA: Diagnosis not present

## 2022-01-22 DIAGNOSIS — D509 Iron deficiency anemia, unspecified: Secondary | ICD-10-CM | POA: Diagnosis not present

## 2022-01-22 DIAGNOSIS — D631 Anemia in chronic kidney disease: Secondary | ICD-10-CM | POA: Diagnosis not present

## 2022-01-22 DIAGNOSIS — N186 End stage renal disease: Secondary | ICD-10-CM | POA: Diagnosis not present

## 2022-01-22 DIAGNOSIS — N2581 Secondary hyperparathyroidism of renal origin: Secondary | ICD-10-CM | POA: Diagnosis not present

## 2022-01-22 DIAGNOSIS — Z992 Dependence on renal dialysis: Secondary | ICD-10-CM | POA: Diagnosis not present

## 2022-01-24 DIAGNOSIS — D509 Iron deficiency anemia, unspecified: Secondary | ICD-10-CM | POA: Diagnosis not present

## 2022-01-24 DIAGNOSIS — N186 End stage renal disease: Secondary | ICD-10-CM | POA: Diagnosis not present

## 2022-01-24 DIAGNOSIS — D631 Anemia in chronic kidney disease: Secondary | ICD-10-CM | POA: Diagnosis not present

## 2022-01-24 DIAGNOSIS — N2581 Secondary hyperparathyroidism of renal origin: Secondary | ICD-10-CM | POA: Diagnosis not present

## 2022-01-24 DIAGNOSIS — Z992 Dependence on renal dialysis: Secondary | ICD-10-CM | POA: Diagnosis not present

## 2022-01-26 DIAGNOSIS — N039 Chronic nephritic syndrome with unspecified morphologic changes: Secondary | ICD-10-CM | POA: Diagnosis not present

## 2022-01-26 DIAGNOSIS — N2581 Secondary hyperparathyroidism of renal origin: Secondary | ICD-10-CM | POA: Diagnosis not present

## 2022-01-26 DIAGNOSIS — Z992 Dependence on renal dialysis: Secondary | ICD-10-CM | POA: Diagnosis not present

## 2022-01-26 DIAGNOSIS — D631 Anemia in chronic kidney disease: Secondary | ICD-10-CM | POA: Diagnosis not present

## 2022-01-26 DIAGNOSIS — N186 End stage renal disease: Secondary | ICD-10-CM | POA: Diagnosis not present

## 2022-01-26 DIAGNOSIS — D509 Iron deficiency anemia, unspecified: Secondary | ICD-10-CM | POA: Diagnosis not present

## 2022-01-29 DIAGNOSIS — D631 Anemia in chronic kidney disease: Secondary | ICD-10-CM | POA: Diagnosis not present

## 2022-01-29 DIAGNOSIS — Z992 Dependence on renal dialysis: Secondary | ICD-10-CM | POA: Diagnosis not present

## 2022-01-29 DIAGNOSIS — N2581 Secondary hyperparathyroidism of renal origin: Secondary | ICD-10-CM | POA: Diagnosis not present

## 2022-01-29 DIAGNOSIS — N186 End stage renal disease: Secondary | ICD-10-CM | POA: Diagnosis not present

## 2022-01-29 DIAGNOSIS — D509 Iron deficiency anemia, unspecified: Secondary | ICD-10-CM | POA: Diagnosis not present

## 2022-01-31 DIAGNOSIS — D509 Iron deficiency anemia, unspecified: Secondary | ICD-10-CM | POA: Diagnosis not present

## 2022-01-31 DIAGNOSIS — N2581 Secondary hyperparathyroidism of renal origin: Secondary | ICD-10-CM | POA: Diagnosis not present

## 2022-01-31 DIAGNOSIS — N186 End stage renal disease: Secondary | ICD-10-CM | POA: Diagnosis not present

## 2022-01-31 DIAGNOSIS — Z992 Dependence on renal dialysis: Secondary | ICD-10-CM | POA: Diagnosis not present

## 2022-01-31 DIAGNOSIS — D631 Anemia in chronic kidney disease: Secondary | ICD-10-CM | POA: Diagnosis not present

## 2022-02-02 DIAGNOSIS — N186 End stage renal disease: Secondary | ICD-10-CM | POA: Diagnosis not present

## 2022-02-02 DIAGNOSIS — Z992 Dependence on renal dialysis: Secondary | ICD-10-CM | POA: Diagnosis not present

## 2022-02-02 DIAGNOSIS — N2581 Secondary hyperparathyroidism of renal origin: Secondary | ICD-10-CM | POA: Diagnosis not present

## 2022-02-02 DIAGNOSIS — D631 Anemia in chronic kidney disease: Secondary | ICD-10-CM | POA: Diagnosis not present

## 2022-02-02 DIAGNOSIS — D509 Iron deficiency anemia, unspecified: Secondary | ICD-10-CM | POA: Diagnosis not present

## 2022-02-05 DIAGNOSIS — N2581 Secondary hyperparathyroidism of renal origin: Secondary | ICD-10-CM | POA: Diagnosis not present

## 2022-02-05 DIAGNOSIS — D631 Anemia in chronic kidney disease: Secondary | ICD-10-CM | POA: Diagnosis not present

## 2022-02-05 DIAGNOSIS — N186 End stage renal disease: Secondary | ICD-10-CM | POA: Diagnosis not present

## 2022-02-05 DIAGNOSIS — D509 Iron deficiency anemia, unspecified: Secondary | ICD-10-CM | POA: Diagnosis not present

## 2022-02-05 DIAGNOSIS — Z992 Dependence on renal dialysis: Secondary | ICD-10-CM | POA: Diagnosis not present

## 2022-02-07 DIAGNOSIS — D509 Iron deficiency anemia, unspecified: Secondary | ICD-10-CM | POA: Diagnosis not present

## 2022-02-07 DIAGNOSIS — D631 Anemia in chronic kidney disease: Secondary | ICD-10-CM | POA: Diagnosis not present

## 2022-02-07 DIAGNOSIS — Z992 Dependence on renal dialysis: Secondary | ICD-10-CM | POA: Diagnosis not present

## 2022-02-07 DIAGNOSIS — N186 End stage renal disease: Secondary | ICD-10-CM | POA: Diagnosis not present

## 2022-02-07 DIAGNOSIS — N2581 Secondary hyperparathyroidism of renal origin: Secondary | ICD-10-CM | POA: Diagnosis not present

## 2022-02-09 DIAGNOSIS — Z992 Dependence on renal dialysis: Secondary | ICD-10-CM | POA: Diagnosis not present

## 2022-02-09 DIAGNOSIS — D509 Iron deficiency anemia, unspecified: Secondary | ICD-10-CM | POA: Diagnosis not present

## 2022-02-09 DIAGNOSIS — N186 End stage renal disease: Secondary | ICD-10-CM | POA: Diagnosis not present

## 2022-02-09 DIAGNOSIS — N2581 Secondary hyperparathyroidism of renal origin: Secondary | ICD-10-CM | POA: Diagnosis not present

## 2022-02-09 DIAGNOSIS — D631 Anemia in chronic kidney disease: Secondary | ICD-10-CM | POA: Diagnosis not present

## 2022-02-12 DIAGNOSIS — D509 Iron deficiency anemia, unspecified: Secondary | ICD-10-CM | POA: Diagnosis not present

## 2022-02-12 DIAGNOSIS — N2581 Secondary hyperparathyroidism of renal origin: Secondary | ICD-10-CM | POA: Diagnosis not present

## 2022-02-12 DIAGNOSIS — N186 End stage renal disease: Secondary | ICD-10-CM | POA: Diagnosis not present

## 2022-02-12 DIAGNOSIS — Z992 Dependence on renal dialysis: Secondary | ICD-10-CM | POA: Diagnosis not present

## 2022-02-12 DIAGNOSIS — D631 Anemia in chronic kidney disease: Secondary | ICD-10-CM | POA: Diagnosis not present

## 2022-02-13 DIAGNOSIS — G5603 Carpal tunnel syndrome, bilateral upper limbs: Secondary | ICD-10-CM | POA: Diagnosis not present

## 2022-02-14 DIAGNOSIS — N186 End stage renal disease: Secondary | ICD-10-CM | POA: Diagnosis not present

## 2022-02-14 DIAGNOSIS — Z992 Dependence on renal dialysis: Secondary | ICD-10-CM | POA: Diagnosis not present

## 2022-02-14 DIAGNOSIS — D509 Iron deficiency anemia, unspecified: Secondary | ICD-10-CM | POA: Diagnosis not present

## 2022-02-14 DIAGNOSIS — N2581 Secondary hyperparathyroidism of renal origin: Secondary | ICD-10-CM | POA: Diagnosis not present

## 2022-02-14 DIAGNOSIS — D631 Anemia in chronic kidney disease: Secondary | ICD-10-CM | POA: Diagnosis not present

## 2022-02-15 DIAGNOSIS — I739 Peripheral vascular disease, unspecified: Secondary | ICD-10-CM | POA: Diagnosis not present

## 2022-02-15 DIAGNOSIS — L603 Nail dystrophy: Secondary | ICD-10-CM | POA: Diagnosis not present

## 2022-02-15 DIAGNOSIS — L84 Corns and callosities: Secondary | ICD-10-CM | POA: Diagnosis not present

## 2022-02-15 DIAGNOSIS — M79671 Pain in right foot: Secondary | ICD-10-CM | POA: Diagnosis not present

## 2022-02-15 DIAGNOSIS — M79672 Pain in left foot: Secondary | ICD-10-CM | POA: Diagnosis not present

## 2022-02-16 DIAGNOSIS — D509 Iron deficiency anemia, unspecified: Secondary | ICD-10-CM | POA: Diagnosis not present

## 2022-02-16 DIAGNOSIS — Z992 Dependence on renal dialysis: Secondary | ICD-10-CM | POA: Diagnosis not present

## 2022-02-16 DIAGNOSIS — N186 End stage renal disease: Secondary | ICD-10-CM | POA: Diagnosis not present

## 2022-02-16 DIAGNOSIS — D631 Anemia in chronic kidney disease: Secondary | ICD-10-CM | POA: Diagnosis not present

## 2022-02-16 DIAGNOSIS — N2581 Secondary hyperparathyroidism of renal origin: Secondary | ICD-10-CM | POA: Diagnosis not present

## 2022-02-18 DIAGNOSIS — D509 Iron deficiency anemia, unspecified: Secondary | ICD-10-CM | POA: Diagnosis not present

## 2022-02-18 DIAGNOSIS — D631 Anemia in chronic kidney disease: Secondary | ICD-10-CM | POA: Diagnosis not present

## 2022-02-18 DIAGNOSIS — N186 End stage renal disease: Secondary | ICD-10-CM | POA: Diagnosis not present

## 2022-02-18 DIAGNOSIS — N2581 Secondary hyperparathyroidism of renal origin: Secondary | ICD-10-CM | POA: Diagnosis not present

## 2022-02-18 DIAGNOSIS — Z992 Dependence on renal dialysis: Secondary | ICD-10-CM | POA: Diagnosis not present

## 2022-02-21 DIAGNOSIS — M179 Osteoarthritis of knee, unspecified: Secondary | ICD-10-CM | POA: Diagnosis not present

## 2022-02-21 DIAGNOSIS — J209 Acute bronchitis, unspecified: Secondary | ICD-10-CM | POA: Diagnosis not present

## 2022-02-22 DIAGNOSIS — N186 End stage renal disease: Secondary | ICD-10-CM | POA: Diagnosis not present

## 2022-02-22 DIAGNOSIS — D509 Iron deficiency anemia, unspecified: Secondary | ICD-10-CM | POA: Diagnosis not present

## 2022-02-22 DIAGNOSIS — Z992 Dependence on renal dialysis: Secondary | ICD-10-CM | POA: Diagnosis not present

## 2022-02-22 DIAGNOSIS — N2581 Secondary hyperparathyroidism of renal origin: Secondary | ICD-10-CM | POA: Diagnosis not present

## 2022-02-22 DIAGNOSIS — D631 Anemia in chronic kidney disease: Secondary | ICD-10-CM | POA: Diagnosis not present

## 2022-02-23 DIAGNOSIS — Z992 Dependence on renal dialysis: Secondary | ICD-10-CM | POA: Diagnosis not present

## 2022-02-23 DIAGNOSIS — D509 Iron deficiency anemia, unspecified: Secondary | ICD-10-CM | POA: Diagnosis not present

## 2022-02-23 DIAGNOSIS — N186 End stage renal disease: Secondary | ICD-10-CM | POA: Diagnosis not present

## 2022-02-23 DIAGNOSIS — D631 Anemia in chronic kidney disease: Secondary | ICD-10-CM | POA: Diagnosis not present

## 2022-02-23 DIAGNOSIS — N2581 Secondary hyperparathyroidism of renal origin: Secondary | ICD-10-CM | POA: Diagnosis not present

## 2022-02-25 DIAGNOSIS — D509 Iron deficiency anemia, unspecified: Secondary | ICD-10-CM | POA: Diagnosis not present

## 2022-02-25 DIAGNOSIS — N186 End stage renal disease: Secondary | ICD-10-CM | POA: Diagnosis not present

## 2022-02-25 DIAGNOSIS — D631 Anemia in chronic kidney disease: Secondary | ICD-10-CM | POA: Diagnosis not present

## 2022-02-25 DIAGNOSIS — N2581 Secondary hyperparathyroidism of renal origin: Secondary | ICD-10-CM | POA: Diagnosis not present

## 2022-02-25 DIAGNOSIS — Z992 Dependence on renal dialysis: Secondary | ICD-10-CM | POA: Diagnosis not present

## 2022-02-26 DIAGNOSIS — N186 End stage renal disease: Secondary | ICD-10-CM | POA: Diagnosis not present

## 2022-02-26 DIAGNOSIS — Z992 Dependence on renal dialysis: Secondary | ICD-10-CM | POA: Diagnosis not present

## 2022-02-26 DIAGNOSIS — N039 Chronic nephritic syndrome with unspecified morphologic changes: Secondary | ICD-10-CM | POA: Diagnosis not present

## 2022-02-28 DIAGNOSIS — N186 End stage renal disease: Secondary | ICD-10-CM | POA: Diagnosis not present

## 2022-02-28 DIAGNOSIS — N2581 Secondary hyperparathyroidism of renal origin: Secondary | ICD-10-CM | POA: Diagnosis not present

## 2022-02-28 DIAGNOSIS — D631 Anemia in chronic kidney disease: Secondary | ICD-10-CM | POA: Diagnosis not present

## 2022-02-28 DIAGNOSIS — Z992 Dependence on renal dialysis: Secondary | ICD-10-CM | POA: Diagnosis not present

## 2022-03-02 DIAGNOSIS — N186 End stage renal disease: Secondary | ICD-10-CM | POA: Diagnosis not present

## 2022-03-02 DIAGNOSIS — N2581 Secondary hyperparathyroidism of renal origin: Secondary | ICD-10-CM | POA: Diagnosis not present

## 2022-03-02 DIAGNOSIS — D631 Anemia in chronic kidney disease: Secondary | ICD-10-CM | POA: Diagnosis not present

## 2022-03-02 DIAGNOSIS — Z992 Dependence on renal dialysis: Secondary | ICD-10-CM | POA: Diagnosis not present

## 2022-03-05 DIAGNOSIS — N186 End stage renal disease: Secondary | ICD-10-CM | POA: Diagnosis not present

## 2022-03-05 DIAGNOSIS — N2581 Secondary hyperparathyroidism of renal origin: Secondary | ICD-10-CM | POA: Diagnosis not present

## 2022-03-05 DIAGNOSIS — Z992 Dependence on renal dialysis: Secondary | ICD-10-CM | POA: Diagnosis not present

## 2022-03-05 DIAGNOSIS — D631 Anemia in chronic kidney disease: Secondary | ICD-10-CM | POA: Diagnosis not present

## 2022-03-07 DIAGNOSIS — Z992 Dependence on renal dialysis: Secondary | ICD-10-CM | POA: Diagnosis not present

## 2022-03-07 DIAGNOSIS — N186 End stage renal disease: Secondary | ICD-10-CM | POA: Diagnosis not present

## 2022-03-07 DIAGNOSIS — D631 Anemia in chronic kidney disease: Secondary | ICD-10-CM | POA: Diagnosis not present

## 2022-03-07 DIAGNOSIS — G609 Hereditary and idiopathic neuropathy, unspecified: Secondary | ICD-10-CM | POA: Diagnosis not present

## 2022-03-07 DIAGNOSIS — N2581 Secondary hyperparathyroidism of renal origin: Secondary | ICD-10-CM | POA: Diagnosis not present

## 2022-03-07 DIAGNOSIS — G5603 Carpal tunnel syndrome, bilateral upper limbs: Secondary | ICD-10-CM | POA: Diagnosis not present

## 2022-03-09 DIAGNOSIS — N186 End stage renal disease: Secondary | ICD-10-CM | POA: Diagnosis not present

## 2022-03-09 DIAGNOSIS — N2581 Secondary hyperparathyroidism of renal origin: Secondary | ICD-10-CM | POA: Diagnosis not present

## 2022-03-09 DIAGNOSIS — Z992 Dependence on renal dialysis: Secondary | ICD-10-CM | POA: Diagnosis not present

## 2022-03-09 DIAGNOSIS — D631 Anemia in chronic kidney disease: Secondary | ICD-10-CM | POA: Diagnosis not present

## 2022-03-12 DIAGNOSIS — Z992 Dependence on renal dialysis: Secondary | ICD-10-CM | POA: Diagnosis not present

## 2022-03-12 DIAGNOSIS — D631 Anemia in chronic kidney disease: Secondary | ICD-10-CM | POA: Diagnosis not present

## 2022-03-12 DIAGNOSIS — N2581 Secondary hyperparathyroidism of renal origin: Secondary | ICD-10-CM | POA: Diagnosis not present

## 2022-03-12 DIAGNOSIS — N186 End stage renal disease: Secondary | ICD-10-CM | POA: Diagnosis not present

## 2022-03-14 DIAGNOSIS — Z992 Dependence on renal dialysis: Secondary | ICD-10-CM | POA: Diagnosis not present

## 2022-03-14 DIAGNOSIS — N2581 Secondary hyperparathyroidism of renal origin: Secondary | ICD-10-CM | POA: Diagnosis not present

## 2022-03-14 DIAGNOSIS — N186 End stage renal disease: Secondary | ICD-10-CM | POA: Diagnosis not present

## 2022-03-14 DIAGNOSIS — D631 Anemia in chronic kidney disease: Secondary | ICD-10-CM | POA: Diagnosis not present

## 2022-03-15 DIAGNOSIS — G5603 Carpal tunnel syndrome, bilateral upper limbs: Secondary | ICD-10-CM | POA: Diagnosis not present

## 2022-03-16 DIAGNOSIS — Z992 Dependence on renal dialysis: Secondary | ICD-10-CM | POA: Diagnosis not present

## 2022-03-16 DIAGNOSIS — D631 Anemia in chronic kidney disease: Secondary | ICD-10-CM | POA: Diagnosis not present

## 2022-03-16 DIAGNOSIS — N186 End stage renal disease: Secondary | ICD-10-CM | POA: Diagnosis not present

## 2022-03-16 DIAGNOSIS — N2581 Secondary hyperparathyroidism of renal origin: Secondary | ICD-10-CM | POA: Diagnosis not present

## 2022-03-19 DIAGNOSIS — D631 Anemia in chronic kidney disease: Secondary | ICD-10-CM | POA: Diagnosis not present

## 2022-03-19 DIAGNOSIS — Z992 Dependence on renal dialysis: Secondary | ICD-10-CM | POA: Diagnosis not present

## 2022-03-19 DIAGNOSIS — N2581 Secondary hyperparathyroidism of renal origin: Secondary | ICD-10-CM | POA: Diagnosis not present

## 2022-03-19 DIAGNOSIS — N186 End stage renal disease: Secondary | ICD-10-CM | POA: Diagnosis not present

## 2022-03-21 DIAGNOSIS — Z992 Dependence on renal dialysis: Secondary | ICD-10-CM | POA: Diagnosis not present

## 2022-03-21 DIAGNOSIS — N186 End stage renal disease: Secondary | ICD-10-CM | POA: Diagnosis not present

## 2022-03-21 DIAGNOSIS — N2581 Secondary hyperparathyroidism of renal origin: Secondary | ICD-10-CM | POA: Diagnosis not present

## 2022-03-21 DIAGNOSIS — D631 Anemia in chronic kidney disease: Secondary | ICD-10-CM | POA: Diagnosis not present

## 2022-03-22 DIAGNOSIS — I871 Compression of vein: Secondary | ICD-10-CM | POA: Diagnosis not present

## 2022-03-22 DIAGNOSIS — T82858A Stenosis of vascular prosthetic devices, implants and grafts, initial encounter: Secondary | ICD-10-CM | POA: Diagnosis not present

## 2022-03-22 DIAGNOSIS — Z992 Dependence on renal dialysis: Secondary | ICD-10-CM | POA: Diagnosis not present

## 2022-03-22 DIAGNOSIS — N186 End stage renal disease: Secondary | ICD-10-CM | POA: Diagnosis not present

## 2022-03-23 DIAGNOSIS — N186 End stage renal disease: Secondary | ICD-10-CM | POA: Diagnosis not present

## 2022-03-23 DIAGNOSIS — D631 Anemia in chronic kidney disease: Secondary | ICD-10-CM | POA: Diagnosis not present

## 2022-03-23 DIAGNOSIS — Z992 Dependence on renal dialysis: Secondary | ICD-10-CM | POA: Diagnosis not present

## 2022-03-23 DIAGNOSIS — N2581 Secondary hyperparathyroidism of renal origin: Secondary | ICD-10-CM | POA: Diagnosis not present

## 2022-03-26 DIAGNOSIS — N2581 Secondary hyperparathyroidism of renal origin: Secondary | ICD-10-CM | POA: Diagnosis not present

## 2022-03-26 DIAGNOSIS — N186 End stage renal disease: Secondary | ICD-10-CM | POA: Diagnosis not present

## 2022-03-26 DIAGNOSIS — Z992 Dependence on renal dialysis: Secondary | ICD-10-CM | POA: Diagnosis not present

## 2022-03-26 DIAGNOSIS — D631 Anemia in chronic kidney disease: Secondary | ICD-10-CM | POA: Diagnosis not present

## 2022-03-27 DIAGNOSIS — G5603 Carpal tunnel syndrome, bilateral upper limbs: Secondary | ICD-10-CM | POA: Diagnosis not present

## 2022-03-28 DIAGNOSIS — Z992 Dependence on renal dialysis: Secondary | ICD-10-CM | POA: Diagnosis not present

## 2022-03-28 DIAGNOSIS — D631 Anemia in chronic kidney disease: Secondary | ICD-10-CM | POA: Diagnosis not present

## 2022-03-28 DIAGNOSIS — N186 End stage renal disease: Secondary | ICD-10-CM | POA: Diagnosis not present

## 2022-03-28 DIAGNOSIS — N2581 Secondary hyperparathyroidism of renal origin: Secondary | ICD-10-CM | POA: Diagnosis not present

## 2022-03-29 DIAGNOSIS — N186 End stage renal disease: Secondary | ICD-10-CM | POA: Diagnosis not present

## 2022-03-29 DIAGNOSIS — Z992 Dependence on renal dialysis: Secondary | ICD-10-CM | POA: Diagnosis not present

## 2022-03-29 DIAGNOSIS — N039 Chronic nephritic syndrome with unspecified morphologic changes: Secondary | ICD-10-CM | POA: Diagnosis not present

## 2022-03-30 DIAGNOSIS — D509 Iron deficiency anemia, unspecified: Secondary | ICD-10-CM | POA: Diagnosis not present

## 2022-03-30 DIAGNOSIS — N2581 Secondary hyperparathyroidism of renal origin: Secondary | ICD-10-CM | POA: Diagnosis not present

## 2022-03-30 DIAGNOSIS — N186 End stage renal disease: Secondary | ICD-10-CM | POA: Diagnosis not present

## 2022-03-30 DIAGNOSIS — D631 Anemia in chronic kidney disease: Secondary | ICD-10-CM | POA: Diagnosis not present

## 2022-03-30 DIAGNOSIS — Z992 Dependence on renal dialysis: Secondary | ICD-10-CM | POA: Diagnosis not present

## 2022-04-02 DIAGNOSIS — D631 Anemia in chronic kidney disease: Secondary | ICD-10-CM | POA: Diagnosis not present

## 2022-04-02 DIAGNOSIS — D509 Iron deficiency anemia, unspecified: Secondary | ICD-10-CM | POA: Diagnosis not present

## 2022-04-02 DIAGNOSIS — N186 End stage renal disease: Secondary | ICD-10-CM | POA: Diagnosis not present

## 2022-04-02 DIAGNOSIS — Z992 Dependence on renal dialysis: Secondary | ICD-10-CM | POA: Diagnosis not present

## 2022-04-02 DIAGNOSIS — N2581 Secondary hyperparathyroidism of renal origin: Secondary | ICD-10-CM | POA: Diagnosis not present

## 2022-04-04 DIAGNOSIS — D631 Anemia in chronic kidney disease: Secondary | ICD-10-CM | POA: Diagnosis not present

## 2022-04-04 DIAGNOSIS — N2581 Secondary hyperparathyroidism of renal origin: Secondary | ICD-10-CM | POA: Diagnosis not present

## 2022-04-04 DIAGNOSIS — Z992 Dependence on renal dialysis: Secondary | ICD-10-CM | POA: Diagnosis not present

## 2022-04-04 DIAGNOSIS — N186 End stage renal disease: Secondary | ICD-10-CM | POA: Diagnosis not present

## 2022-04-04 DIAGNOSIS — D509 Iron deficiency anemia, unspecified: Secondary | ICD-10-CM | POA: Diagnosis not present

## 2022-04-06 DIAGNOSIS — N186 End stage renal disease: Secondary | ICD-10-CM | POA: Diagnosis not present

## 2022-04-06 DIAGNOSIS — Z992 Dependence on renal dialysis: Secondary | ICD-10-CM | POA: Diagnosis not present

## 2022-04-06 DIAGNOSIS — D509 Iron deficiency anemia, unspecified: Secondary | ICD-10-CM | POA: Diagnosis not present

## 2022-04-06 DIAGNOSIS — D631 Anemia in chronic kidney disease: Secondary | ICD-10-CM | POA: Diagnosis not present

## 2022-04-06 DIAGNOSIS — N2581 Secondary hyperparathyroidism of renal origin: Secondary | ICD-10-CM | POA: Diagnosis not present

## 2022-04-09 DIAGNOSIS — D509 Iron deficiency anemia, unspecified: Secondary | ICD-10-CM | POA: Diagnosis not present

## 2022-04-09 DIAGNOSIS — D631 Anemia in chronic kidney disease: Secondary | ICD-10-CM | POA: Diagnosis not present

## 2022-04-09 DIAGNOSIS — Z992 Dependence on renal dialysis: Secondary | ICD-10-CM | POA: Diagnosis not present

## 2022-04-09 DIAGNOSIS — N186 End stage renal disease: Secondary | ICD-10-CM | POA: Diagnosis not present

## 2022-04-09 DIAGNOSIS — N2581 Secondary hyperparathyroidism of renal origin: Secondary | ICD-10-CM | POA: Diagnosis not present

## 2022-04-11 DIAGNOSIS — N2581 Secondary hyperparathyroidism of renal origin: Secondary | ICD-10-CM | POA: Diagnosis not present

## 2022-04-11 DIAGNOSIS — N186 End stage renal disease: Secondary | ICD-10-CM | POA: Diagnosis not present

## 2022-04-11 DIAGNOSIS — D631 Anemia in chronic kidney disease: Secondary | ICD-10-CM | POA: Diagnosis not present

## 2022-04-11 DIAGNOSIS — Z992 Dependence on renal dialysis: Secondary | ICD-10-CM | POA: Diagnosis not present

## 2022-04-11 DIAGNOSIS — D509 Iron deficiency anemia, unspecified: Secondary | ICD-10-CM | POA: Diagnosis not present

## 2022-04-13 DIAGNOSIS — Z992 Dependence on renal dialysis: Secondary | ICD-10-CM | POA: Diagnosis not present

## 2022-04-13 DIAGNOSIS — N2581 Secondary hyperparathyroidism of renal origin: Secondary | ICD-10-CM | POA: Diagnosis not present

## 2022-04-13 DIAGNOSIS — D509 Iron deficiency anemia, unspecified: Secondary | ICD-10-CM | POA: Diagnosis not present

## 2022-04-13 DIAGNOSIS — N186 End stage renal disease: Secondary | ICD-10-CM | POA: Diagnosis not present

## 2022-04-13 DIAGNOSIS — D631 Anemia in chronic kidney disease: Secondary | ICD-10-CM | POA: Diagnosis not present

## 2022-04-16 DIAGNOSIS — N2581 Secondary hyperparathyroidism of renal origin: Secondary | ICD-10-CM | POA: Diagnosis not present

## 2022-04-16 DIAGNOSIS — N186 End stage renal disease: Secondary | ICD-10-CM | POA: Diagnosis not present

## 2022-04-16 DIAGNOSIS — D509 Iron deficiency anemia, unspecified: Secondary | ICD-10-CM | POA: Diagnosis not present

## 2022-04-16 DIAGNOSIS — Z992 Dependence on renal dialysis: Secondary | ICD-10-CM | POA: Diagnosis not present

## 2022-04-16 DIAGNOSIS — D631 Anemia in chronic kidney disease: Secondary | ICD-10-CM | POA: Diagnosis not present

## 2022-04-18 DIAGNOSIS — Z992 Dependence on renal dialysis: Secondary | ICD-10-CM | POA: Diagnosis not present

## 2022-04-18 DIAGNOSIS — D631 Anemia in chronic kidney disease: Secondary | ICD-10-CM | POA: Diagnosis not present

## 2022-04-18 DIAGNOSIS — N2581 Secondary hyperparathyroidism of renal origin: Secondary | ICD-10-CM | POA: Diagnosis not present

## 2022-04-18 DIAGNOSIS — N186 End stage renal disease: Secondary | ICD-10-CM | POA: Diagnosis not present

## 2022-04-18 DIAGNOSIS — D509 Iron deficiency anemia, unspecified: Secondary | ICD-10-CM | POA: Diagnosis not present

## 2022-04-20 DIAGNOSIS — N186 End stage renal disease: Secondary | ICD-10-CM | POA: Diagnosis not present

## 2022-04-20 DIAGNOSIS — D509 Iron deficiency anemia, unspecified: Secondary | ICD-10-CM | POA: Diagnosis not present

## 2022-04-20 DIAGNOSIS — Z992 Dependence on renal dialysis: Secondary | ICD-10-CM | POA: Diagnosis not present

## 2022-04-20 DIAGNOSIS — D631 Anemia in chronic kidney disease: Secondary | ICD-10-CM | POA: Diagnosis not present

## 2022-04-20 DIAGNOSIS — N2581 Secondary hyperparathyroidism of renal origin: Secondary | ICD-10-CM | POA: Diagnosis not present

## 2022-04-23 DIAGNOSIS — D631 Anemia in chronic kidney disease: Secondary | ICD-10-CM | POA: Diagnosis not present

## 2022-04-23 DIAGNOSIS — N2581 Secondary hyperparathyroidism of renal origin: Secondary | ICD-10-CM | POA: Diagnosis not present

## 2022-04-23 DIAGNOSIS — D509 Iron deficiency anemia, unspecified: Secondary | ICD-10-CM | POA: Diagnosis not present

## 2022-04-23 DIAGNOSIS — Z992 Dependence on renal dialysis: Secondary | ICD-10-CM | POA: Diagnosis not present

## 2022-04-23 DIAGNOSIS — N186 End stage renal disease: Secondary | ICD-10-CM | POA: Diagnosis not present

## 2022-04-25 DIAGNOSIS — N2581 Secondary hyperparathyroidism of renal origin: Secondary | ICD-10-CM | POA: Diagnosis not present

## 2022-04-25 DIAGNOSIS — D631 Anemia in chronic kidney disease: Secondary | ICD-10-CM | POA: Diagnosis not present

## 2022-04-25 DIAGNOSIS — Z992 Dependence on renal dialysis: Secondary | ICD-10-CM | POA: Diagnosis not present

## 2022-04-25 DIAGNOSIS — N186 End stage renal disease: Secondary | ICD-10-CM | POA: Diagnosis not present

## 2022-04-25 DIAGNOSIS — D509 Iron deficiency anemia, unspecified: Secondary | ICD-10-CM | POA: Diagnosis not present

## 2022-04-27 DIAGNOSIS — Z992 Dependence on renal dialysis: Secondary | ICD-10-CM | POA: Diagnosis not present

## 2022-04-27 DIAGNOSIS — N186 End stage renal disease: Secondary | ICD-10-CM | POA: Diagnosis not present

## 2022-04-27 DIAGNOSIS — N039 Chronic nephritic syndrome with unspecified morphologic changes: Secondary | ICD-10-CM | POA: Diagnosis not present

## 2022-04-27 DIAGNOSIS — N2581 Secondary hyperparathyroidism of renal origin: Secondary | ICD-10-CM | POA: Diagnosis not present

## 2022-04-27 DIAGNOSIS — D631 Anemia in chronic kidney disease: Secondary | ICD-10-CM | POA: Diagnosis not present

## 2022-04-27 DIAGNOSIS — D509 Iron deficiency anemia, unspecified: Secondary | ICD-10-CM | POA: Diagnosis not present

## 2022-04-30 DIAGNOSIS — N186 End stage renal disease: Secondary | ICD-10-CM | POA: Diagnosis not present

## 2022-04-30 DIAGNOSIS — N2581 Secondary hyperparathyroidism of renal origin: Secondary | ICD-10-CM | POA: Diagnosis not present

## 2022-04-30 DIAGNOSIS — D509 Iron deficiency anemia, unspecified: Secondary | ICD-10-CM | POA: Diagnosis not present

## 2022-04-30 DIAGNOSIS — D631 Anemia in chronic kidney disease: Secondary | ICD-10-CM | POA: Diagnosis not present

## 2022-04-30 DIAGNOSIS — Z992 Dependence on renal dialysis: Secondary | ICD-10-CM | POA: Diagnosis not present

## 2022-05-02 DIAGNOSIS — N186 End stage renal disease: Secondary | ICD-10-CM | POA: Diagnosis not present

## 2022-05-02 DIAGNOSIS — N2581 Secondary hyperparathyroidism of renal origin: Secondary | ICD-10-CM | POA: Diagnosis not present

## 2022-05-02 DIAGNOSIS — D509 Iron deficiency anemia, unspecified: Secondary | ICD-10-CM | POA: Diagnosis not present

## 2022-05-02 DIAGNOSIS — D631 Anemia in chronic kidney disease: Secondary | ICD-10-CM | POA: Diagnosis not present

## 2022-05-02 DIAGNOSIS — Z992 Dependence on renal dialysis: Secondary | ICD-10-CM | POA: Diagnosis not present

## 2022-05-03 ENCOUNTER — Telehealth: Payer: Self-pay | Admitting: Cardiology

## 2022-05-03 NOTE — Telephone Encounter (Signed)
Patient stated she is on the kidney transplant list at [facility], while having a cardiac work-up  she was told leaking around her heart. Pt stated she did was told Dr. Radford Pax and Nephrologist would consult to see if heart would be strong enough to handle the surgery. However, she hasn't heard anything from our office. Pt is scheduled at our office with APP on 05/15/22.

## 2022-05-03 NOTE — Telephone Encounter (Signed)
Patient is calling back to get questions about a matter she asked about a matter she is dealing with. Patient is on a kidney transplant list and also supposed to get heart surgery but haven't heard back from anyone yet since 08/23

## 2022-05-04 DIAGNOSIS — N186 End stage renal disease: Secondary | ICD-10-CM | POA: Diagnosis not present

## 2022-05-04 DIAGNOSIS — Z992 Dependence on renal dialysis: Secondary | ICD-10-CM | POA: Diagnosis not present

## 2022-05-04 DIAGNOSIS — D509 Iron deficiency anemia, unspecified: Secondary | ICD-10-CM | POA: Diagnosis not present

## 2022-05-04 DIAGNOSIS — D631 Anemia in chronic kidney disease: Secondary | ICD-10-CM | POA: Diagnosis not present

## 2022-05-04 DIAGNOSIS — N2581 Secondary hyperparathyroidism of renal origin: Secondary | ICD-10-CM | POA: Diagnosis not present

## 2022-05-04 NOTE — Telephone Encounter (Signed)
Per Dr. Johney Frame, called patient back and referred her back to Dr. Kennith Center, cardiologist she saw at [facility] who was working her up for kidney transplantation. Patient last saw Dr. Radford Pax in 2022, she saw Dr. Kennith Center in September of 2023 and at that time he seemed to be planning to reach out to transplant team regarding valve repair. Advised patient that if Dr. Kennith Center had any questions he could certainly contact us about any pre-op concerns. Patient verbalizes understanding.

## 2022-05-07 DIAGNOSIS — Z992 Dependence on renal dialysis: Secondary | ICD-10-CM | POA: Diagnosis not present

## 2022-05-07 DIAGNOSIS — D631 Anemia in chronic kidney disease: Secondary | ICD-10-CM | POA: Diagnosis not present

## 2022-05-07 DIAGNOSIS — N2581 Secondary hyperparathyroidism of renal origin: Secondary | ICD-10-CM | POA: Diagnosis not present

## 2022-05-07 DIAGNOSIS — D509 Iron deficiency anemia, unspecified: Secondary | ICD-10-CM | POA: Diagnosis not present

## 2022-05-07 DIAGNOSIS — N186 End stage renal disease: Secondary | ICD-10-CM | POA: Diagnosis not present

## 2022-05-09 DIAGNOSIS — N186 End stage renal disease: Secondary | ICD-10-CM | POA: Diagnosis not present

## 2022-05-09 DIAGNOSIS — Z992 Dependence on renal dialysis: Secondary | ICD-10-CM | POA: Diagnosis not present

## 2022-05-09 DIAGNOSIS — D509 Iron deficiency anemia, unspecified: Secondary | ICD-10-CM | POA: Diagnosis not present

## 2022-05-09 DIAGNOSIS — D631 Anemia in chronic kidney disease: Secondary | ICD-10-CM | POA: Diagnosis not present

## 2022-05-09 DIAGNOSIS — N2581 Secondary hyperparathyroidism of renal origin: Secondary | ICD-10-CM | POA: Diagnosis not present

## 2022-05-11 DIAGNOSIS — N2581 Secondary hyperparathyroidism of renal origin: Secondary | ICD-10-CM | POA: Diagnosis not present

## 2022-05-11 DIAGNOSIS — D509 Iron deficiency anemia, unspecified: Secondary | ICD-10-CM | POA: Diagnosis not present

## 2022-05-11 DIAGNOSIS — D631 Anemia in chronic kidney disease: Secondary | ICD-10-CM | POA: Diagnosis not present

## 2022-05-11 DIAGNOSIS — Z992 Dependence on renal dialysis: Secondary | ICD-10-CM | POA: Diagnosis not present

## 2022-05-11 DIAGNOSIS — N186 End stage renal disease: Secondary | ICD-10-CM | POA: Diagnosis not present

## 2022-05-14 DIAGNOSIS — N2581 Secondary hyperparathyroidism of renal origin: Secondary | ICD-10-CM | POA: Diagnosis not present

## 2022-05-14 DIAGNOSIS — D509 Iron deficiency anemia, unspecified: Secondary | ICD-10-CM | POA: Diagnosis not present

## 2022-05-14 DIAGNOSIS — N186 End stage renal disease: Secondary | ICD-10-CM | POA: Diagnosis not present

## 2022-05-14 DIAGNOSIS — Z992 Dependence on renal dialysis: Secondary | ICD-10-CM | POA: Diagnosis not present

## 2022-05-14 DIAGNOSIS — D631 Anemia in chronic kidney disease: Secondary | ICD-10-CM | POA: Diagnosis not present

## 2022-05-15 ENCOUNTER — Ambulatory Visit: Payer: Medicare Other | Attending: Physician Assistant | Admitting: Physician Assistant

## 2022-05-15 VITALS — BP 98/56 | HR 68 | Ht 59.0 in | Wt 149.2 lb

## 2022-05-15 DIAGNOSIS — Z992 Dependence on renal dialysis: Secondary | ICD-10-CM | POA: Diagnosis not present

## 2022-05-15 DIAGNOSIS — I359 Nonrheumatic aortic valve disorder, unspecified: Secondary | ICD-10-CM | POA: Insufficient documentation

## 2022-05-15 DIAGNOSIS — I071 Rheumatic tricuspid insufficiency: Secondary | ICD-10-CM | POA: Insufficient documentation

## 2022-05-15 DIAGNOSIS — Z952 Presence of prosthetic heart valve: Secondary | ICD-10-CM | POA: Insufficient documentation

## 2022-05-15 DIAGNOSIS — N186 End stage renal disease: Secondary | ICD-10-CM | POA: Diagnosis not present

## 2022-05-15 DIAGNOSIS — Z8679 Personal history of other diseases of the circulatory system: Secondary | ICD-10-CM | POA: Diagnosis not present

## 2022-05-15 MED ORDER — CLOPIDOGREL BISULFATE 75 MG PO TABS: 75.0000 mg | ORAL_TABLET | Freq: Every day | ORAL | 3 refills | Status: DC

## 2022-05-15 NOTE — Progress Notes (Signed)
Office Visit    Patient Name: [name] Marks Date of Encounter: 05/15/2022  PCP:  Eppie Gibson, MD   [place]  Cardiologist:  Fransico Him, MD  Advanced Practice Provider:  No care team member to display Electrophysiologist:  None   HPI    [name] Marks is a 67 y.o. female with past medical history significant for endocarditis, severe AI status post bioprosthetic AVR, ESRD on HD, TIA, moderate TR presents today for follow-up appointment.  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  This was complicated by severe aortic insufficiency.  Underwent cardiac catheterization showing normal coronary arteries and then underwent bioprosthetic AVR.  Unfortunately, she subsequently had a TIA March 2018.  Status post ILR implantation by Dr. Curt Bears 09/2016.  Last echo 08/2019 with LVEF of 65 to 70%, grade 1 DD, normal RSVP at 28.4 mmHg, severe TR, normal functioning aortic valve.  Stress test 10/04/2020 with no clear evidence of significant ischemia or scar.  Patient was last seen 11/02/2021 and at that time she was undergoing kidney transplant workup at [facility].  Seen by cardiologist at [facility] as part of the workup.  Underwent echocardiogram 7/23 showing LV function greater than 55%, status post bioprosthetic AVR with normal function, mildly dilated RV with normal function.  Severe TR.  Now requiring to have her valve fixed prior to kidney transplant.  Follow-up with the surgeon was scheduled at that time.  Patient denies shortness of breath, orthopnea, PND, syncope, lower extremity edema or melena.  Does not make urine and has dialysis M, W, F.  Today, she states that she sometimes has some chest pains but they are not regular and they are nonexertional.  No shortness of breath or swelling in her legs.  She states she lost about 27 pounds over the past couple of months.  She was sick back in December and has lessened her portion  sizes.  She is eating but just less.  She was diagnosed with severe TR back in 2021 via echocardiogram.  There has been mention that she would need a surgical repair prior to getting her kidney transplant.  Per [facility] notes back in September her TR is stable enough to go forward with transplant surgery however, would require treadmill stress test to be done through them prior to transplant surgery.  Her blood pressure is low normal today.  She is asymptomatic.  Echocardiogram performed at [facility] last summer reviewed with the patient today.  Reports no shortness of breath nor dyspnea on exertion. Reports no chest pain, pressure, or tightness. No edema, orthopnea, PND. Reports no palpitations.    Past Medical History    Past Medical History:  Diagnosis Date   Anemia    Aortic valve prosthesis present 02/25/2016   Arthritis    "knees" (01/03/2017)   AVD (aortic valve disease) 07/12/2016   Backache 12/06/2008   Bacteremia due to coagulase-negative Staphylococcus    Carpal tunnel syndrome    Cerebral embolism with transient ischemic attack (TIA)    Cholelithiases 01/28/2017   Chronic female pelvic pain [id number]   Complication of anesthesia    01/01/17- '"a long time ago, difficulty breathimg, not sure if it was due to anesthesia or not."   CVA (cerebral vascular accident) ([place]) 05/16/2016   End stage renal disease on dialysis ([place])    "MWF; [place]." (01/03/2017)   Endocarditis    Esophageal reflux 12/06/2008   GERD (gastroesophageal reflux disease)  Gout    History of blood transfusion 2017   "related to blood poison" (01/03/2017)   Increased endometrial stripe thickness 11/03/2015   Neck pain    Osteoporosis 09/2016   T score -2.6   Pain and swelling of right upper extremity 12/20/2015   Pulmonary HTN ([place]) 07/12/2016   Renal dialysis device, implant, or graft complication 23/55/7322   Renal insufficiency    S/P cholecystectomy 02/14/2017   Septic shock ([place])     Staphylococcus aureus bacteremia    TIA (transient ischemic attack)    "several" (01/03/2017)   Past Surgical History:  Procedure Laterality Date   AORTIC VALVE REPLACEMENT N/A 02/25/2016   Procedure: AORTIC VALVE REPLACEMENT (AVR) implanted with Magna Ease Aortic valve size 49mm;  Surgeon: Melrose Nakayama, MD;  Location: [place];  Service: Open Heart Surgery;  Laterality: N/A;   AV FISTULA PLACEMENT Left 01/03/2017   Procedure: INSERTION OF ARTERIOVENOUS (AV) GORE-TEX GRAFT LEFT THIGH;  Surgeon: Serafina Mitchell, MD;  Location: [place];  Service: Vascular;  Laterality: Left;   [place] Right 02/17/2016   Procedure: REMOVAL OF TWO ARTERIOVENOUS GORETEX GRAFTS ([place]);  Surgeon: Angelia Mould, MD;  Location: [facility];  Service: Vascular;  Laterality: Right;   BIOPSY  05/16/2021   Procedure: BIOPSY;  Surgeon: Wilford Corner, MD;  Location: [facility] ENDOS[facility];  Service: Endoscopy;;   BREAST BIOPSY Left 2013   stereo    BREAST BIOPSY Right 2011   stereo    CARDIAC VALVE REPLACEMENT     CHOLECYSTECTOMY N/A 01/30/2017   Procedure: LAPAROSCOPIC CHOLECYSTECTOMY;  Surgeon: Erroll Luna, MD;  Location: [place];  Service: General;  Laterality: N/A;   COLONOSCOPY W/ POLYPECTOMY     COLONOSCOPY WITH PROPOFOL N/A 05/16/2021   Procedure: COLONOSCOPY WITH PROPOFOL;  Surgeon: Wilford Corner, MD;  Location: [facility];  Service: Endoscopy;  Laterality: N/A;   DG AV DIALYSIS GRAFT DECLOT OR     DILATATION & CURETTAGE/HYSTEROSCOPY WITH TRUECLEAR N/A 11/06/2012   Procedure: DILATATION & CURETTAGE/HYSTEROSCOPY WITH TRUECLEAR;  Surgeon: Terrance Mass, MD;  Location: [place] [facility];  Service: Gynecology;  Laterality: N/A;  Truclear Resectoscopic Polypectomy    INSERTION OF DIALYSIS CATHETER N/A 02/19/2016   Procedure: INSERTION OF Left Internal Jugular DIALYSIS CATHETER;  Surgeon: Angelia Mould, MD;  Location: [place];  Service: Vascular;  Laterality: N/A;   LOOP RECORDER INSERTION N/A 10/01/2016    Procedure: LOOP RECORDER INSERTION;  Surgeon: Constance Haw, MD;  Location: [place] CV [facility];  Service: Cardiovascular;  Laterality: N/A;   PATCH ANGIOPLASTY Right 02/17/2016   Procedure: PATCH ANGIOPLASTY;  Surgeon: Angelia Mould, MD;  Location: [place];  Service: Vascular;  Laterality: Right;   PERIPHERAL VASCULAR CATHETERIZATION N/A 09/14/2014   Procedure: A/V Shuntogram/Fistulagram;  Surgeon: Katha Cabal, MD;  Location: [place] [facility];  Service: Cardiovascular;  Laterality: N/A;   PERIPHERAL VASCULAR CATHETERIZATION N/A 09/14/2014   Procedure: A/V Shunt Intervention;  Surgeon: Katha Cabal, MD;  Location: [place] [facility];  Service: Cardiovascular;  Laterality: N/A;   PERIPHERAL VASCULAR CATHETERIZATION Right 12/09/2014   Procedure: A/V Shuntogram/Fistulagram;  Surgeon: Algernon Huxley, MD;  Location: [place] [facility];  Service: Cardiovascular;  Laterality: Right;   PERIPHERAL VASCULAR CATHETERIZATION N/A 12/09/2014   Procedure: A/V Shunt Intervention;  Surgeon: Algernon Huxley, MD;  Location: [place] [facility];  Service: Cardiovascular;  Laterality: N/A;   PERIPHERAL VASCULAR CATHETERIZATION Right 05/24/2015   Procedure: A/V Shuntogram;  Surgeon: Serafina Mitchell,  MD;  Location: [place] [facility];  Service: Cardiovascular;  Laterality: Right;   PERIPHERAL VASCULAR CATHETERIZATION Right 05/24/2015   Procedure: Peripheral Vascular Balloon Angioplasty;  Surgeon: Serafina Mitchell, MD;  Location: [place] [facility];  Service: Cardiovascular;  Laterality: Right;  right arm shunt   PERIPHERAL VASCULAR CATHETERIZATION N/A 06/13/2015   Procedure: A/V Shuntogram/Fistulagram;  Surgeon: Algernon Huxley, MD;  Location: [place] [facility];  Service: Cardiovascular;  Laterality: N/A;   PERIPHERAL VASCULAR CATHETERIZATION N/A 06/13/2015   Procedure: A/V Shunt Intervention;  Surgeon: Algernon Huxley, MD;  Location: [place] [facility];  Service: Cardiovascular;  Laterality: N/A;   TEE  WITHOUT CARDIOVERSION N/A 02/22/2016   Procedure: TRANSESOPHAGEAL ECHOCARDIOGRAM (TEE);  Surgeon: Pixie Casino, MD;  Location: [facility] ENDOSCOPY;  Service: Cardiovascular;  Laterality: N/A;   TEE WITHOUT CARDIOVERSION N/A 02/25/2016   Procedure: TRANSESOPHAGEAL ECHOCARDIOGRAM (TEE);  Surgeon: Melrose Nakayama, MD;  Location: [place];  Service: Open Heart Surgery;  Laterality: N/A;   TEE WITHOUT CARDIOVERSION N/A 10/01/2016   Procedure: TRANSESOPHAGEAL ECHOCARDIOGRAM (TEE);  Surgeon: Pixie Casino, MD;  Location: [facility] ENDOSCOPY;  Service: Cardiovascular;  Laterality: N/A;   TUBAL LIGATION  1983    Allergies  Allergies  Allergen Reactions   Iodinated Contrast Media Anaphylaxis and Hives   Cefazolin Nausea And Vomiting   Naproxen Sodium Itching    EKGs/Labs/Other Studies Reviewed:   The following studies were reviewed today: Echo 08/2021 @ [facility] Summary    1. The left ventricle is normal in size with normal wall thickness.    2. The left ventricular systolic function is normal, LVEF is visually  estimated at > 55%.    3. Aortic valve replacement (bioprosthetic, implantation date:  2017).    4. Aortic valve Doppler indices are consistent with normal prosthetic valve  function.    5. The left atrium is mildly dilated in size.    6. The right ventricle is mildly dilated in size, with normal systolic  function.    7. There is severe tricuspid regurgitation.    8. The right atrium is mildly dilated  in size.    9. IVC size and inspiratory change suggest elevated right atrial pressure.  (10-20 mmHg).    Left Ventricle    The left ventricle is normal in size with normal wall thickness.    The left ventricular systolic function is normal, LVEF is visually estimated  at > 55%.    There is normal left ventricular diastolic function.   Right Ventricle    The right ventricle is mildly dilated in size, with normal systolic  function.    Left Atrium    The left atrium is mildly dilated  in size.   Right Atrium    The right atrium is mildly dilated  in size.    Aortic Valve    The aortic valve is trileaflet with normal appearing leaflets with normal  excursion.    There is no significant aortic regurgitation.    There is no evidence of a significant transvalvular gradient.    Aortic valve replacement (bioprosthetic, implantation date: 2017).    Mean gradient: 9 mmHg.    The prosthetic aortic valve is well seated.    The prosthetic aortic valve leaflets are thin and pliable.    There is no regurgitation of the prosthetic aortic valve.    Aortic valve Doppler indices are consistent with normal prosthetic valve  function.    AV estimated effective orifice area (by  continuity equation):  1.5 cm2.   Pulmonic Valve    The pulmonic valve is normal.    There is no significant pulmonic regurgitation.    There is no evidence of a significant transvalvular gradient.   Mitral Valve    The mitral valve leaflets are normal with normal leaflet mobility.    There is no significant mitral valve regurgitation.   Tricuspid Valve    The tricuspid valve leaflets are normal, with normal leaflet mobility.    There is severe tricuspid regurgitation.    The pulmonary systolic pressure cannot be estimated due to severe TR.    Pericardium/Pleural    There is no pericardial effusion.   Inferior Vena Cava    IVC size and inspiratory change suggest elevated right atrial pressure.  (10-20 mmHg).   Aorta    The aorta is normal in size in the visualized segments.    Left Ventricular Outflow Tract  ----------------------------------------------------------------------  Name                                 Value        Normal  ----------------------------------------------------------------------   LVOT 2D  ----------------------------------------------------------------------  LVOT Diameter                       1.6 cm                LVOT Area                          2.0 cm2                  LVOT Doppler  ----------------------------------------------------------------------  LVOT Peak Velocity                 1.4 m/s                LVOT VTI                             29 cm                LVOT Stroke Volume                   58 ml   Pulmonic Valve  ----------------------------------------------------------------------  Name                                 Value        Normal  ----------------------------------------------------------------------   PV Doppler  ----------------------------------------------------------------------  PV Peak Velocity                   0.8 m/s   Mitral Valve  ----------------------------------------------------------------------  Name                                 Value        Normal  ----------------------------------------------------------------------   MV Diastolic Function  ----------------------------------------------------------------------  MV E Peak Velocity                 74 cm/s                MV A Peak Velocity  66 cm/s                MV E/A                                 1.1                 MV Annular TDI  ----------------------------------------------------------------------  MV Septal e' Velocity             8.4 cm/s         >=8.0  MV Lateral e' Velocity           12.9 cm/s        >=10.0  MV e' Average                         10.6                MV E/e' (Average)                      7.3   Tricuspid Valve  ----------------------------------------------------------------------  Name                                 Value        Normal  ----------------------------------------------------------------------   TV Regurgitation Doppler  ----------------------------------------------------------------------  TR Peak Velocity                   2.6 m/s                 Estimated PAP/RSVP  ----------------------------------------------------------------------  RA Pressure                         15 mmHg           <=5  RV Systolic Pressure               41 mmHg           <36   Aorta  ----------------------------------------------------------------------  Name                                 Value        Normal  ----------------------------------------------------------------------   Ascending Aorta  ----------------------------------------------------------------------  Ao Root Diameter (2D)               2.0 cm                Ao Root Diam Index (2D)         11.0 cm/m2   Venous  ----------------------------------------------------------------------  Name                                 Value        Normal  ----------------------------------------------------------------------   IVC/SVC  ----------------------------------------------------------------------  IVC Diameter (Exp 2D)               2.1 cm         <=2.1   Aortic Valve  ----------------------------------------------------------------------  Name  Value        Normal  ----------------------------------------------------------------------   AV Doppler  ----------------------------------------------------------------------  AV Mean Gradient                    9 mmHg                AV VTI                               38 cm                AV Area (Cont Eq VTI)              1.5 cm2         >=3.0  AV Area Index (Cont Eq VTI)     0.8 cm2/m2   Ventricles  ----------------------------------------------------------------------  Name                                 Value        Normal  ----------------------------------------------------------------------   LV Dimensions 2D/MM  ----------------------------------------------------------------------  IVS Diastolic Thickness (2D)        0.6 cm       0.6-0.9  LVID Diastole (2D)                  3.9 cm       [id number]  LVPW Diastolic Thickness  (2D)                                0.6 cm       0.6-0.9  LVID Systole (2D)                   2.4  cm       2.2-3.5  LVOT Diameter                       1.6 cm                 RV Dimensions 2D/MM  ----------------------------------------------------------------------  RV Basal Diastolic Dimension        4.5 cm       2.5-4.1  TAPSE                               1.3 cm         >=1.7   Atria  ----------------------------------------------------------------------  Name                                 Value        Normal  ----------------------------------------------------------------------   LA Dimensions  ----------------------------------------------------------------------  LA Dimension (2D)                   3.8 cm       2.7-3.8   RA Dimensions  ----------------------------------------------------------------------  RA Area (4C)                      11.7 cm2        <=18.0  RA Area (4C) Index              6.4 cm2/m2      NM  stress 10/04/20 - No clear evidence of significant ischemia or scar. - There is a small in size, mild in severity, partially reversible defect involving apical anterior and apical lateral segments, likely an artifact but cannot rule out mild ischemia.  - Left ventricular systolic function is hyperdynamic. Post stress the ejection fraction is calculated at 90%. The left ventricle is relatively small. - Mitral annular calcifications are noted - Central venous catheter extending from the left innominate vein with tip at the SVC/RA junction. - Bioprosthetic aortic valve noted in the aortic position.   Echo 08/2019  1. Left ventricular ejection fraction, by estimation, is 65 to 70%. The  left ventricle has normal function. The left ventricle has no regional  wall motion abnormalities. Left ventricular diastolic parameters are  consistent with Grade I diastolic  dysfunction (impaired relaxation).   2. Right ventricular systolic function is normal. The right ventricular  size is mildly enlarged. There is normal pulmonary artery systolic  pressure. The estimated  right ventricular systolic pressure is [id number] mmHg.   3. Left atrial size was mildly dilated.   4. Right atrial size was mildly dilated.   5. The mitral valve is normal in structure. Mild mitral valve  regurgitation. No evidence of mitral stenosis.   6. Tricuspid valve regurgitation is severe.   7. The aortic valve has been repaired/replaced. Aortic valve  regurgitation is not visualized. No aortic stenosis is present. There is a  21 mm Magna Ease valve present in the aortic position. Procedure Date:  02/25/2016. Aortic valve mean gradient  measures 9.0 mmHg.   8. The inferior vena cava is normal in size with greater than 50%  respiratory variability, suggesting right atrial pressure of 3 mmHg.     EKG:  EKG is not ordered today.   Recent Labs: 08/08/2021: ALT 11; BUN 18; Creatinine, Ser 7.08; Potassium 3.6; Sodium 142 11/09/2021: Hemoglobin 10.6; Platelets 236  Recent Lipid Panel    Component Value Date/Time   CHOL 147 09/29/2016 0531   TRIG 83 09/29/2016 0531   HDL 66 09/29/2016 0531   CHOLHDL 2.2 09/29/2016 0531   VLDL 17 09/29/2016 0531   LDLCALC 64 09/29/2016 0531    Home Medications   Current Meds  Medication Sig   acetaminophen (TYLENOL) 500 MG tablet Take 500-1,000 mg by mouth every 6 (six) hours as needed (for pain.).   cinacalcet (SENSIPAR) 90 MG tablet Take 90 mg by mouth at bedtime.   famotidine (PEPCID) 20 MG tablet Take 20 mg by mouth daily after supper.   midodrine (PROAMATINE) 10 MG tablet Take 10 mg by mouth See admin instructions. Take 1 tablet (10 mg) by mouth before dialysis & take 1 tablet (10 mg) while at dialysis on Mondays, Wednesdays & Fridays.   Multiple Vitamins-Minerals (PRORENAL QD PO) Take 1 tablet by mouth at bedtime.    sevelamer carbonate (RENVELA) 800 MG tablet Take 2,400 mg by mouth See admin instructions. Take 3 tablets (2400 mg) by mouth after each meal and after each snack   [DISCONTINUED] alendronate (FOSAMAX) 70 MG tablet Take 1 tablet (70  mg total) by mouth every 7 (seven) days. Take with a full glass of water on an empty stomach.   [DISCONTINUED] clopidogrel (PLAVIX) 75 MG tablet Take 1 tablet (75 mg total) by mouth daily.     Review of Systems      All other systems reviewed and are otherwise negative except as noted above.  Physical Exam    VS:  BP Marland Kitchen)  98/56   Pulse 68   Ht 4\' 11"  (1.499 m)   Wt 149 lb 3.2 oz (67.7 kg)   SpO2 92%   BMI 30.13 kg/m  , BMI Body mass index is 30.13 kg/m.  Wt Readings from Last 3 Encounters:  05/15/22 149 lb 3.2 oz (67.7 kg)  12/14/21 169 lb (76.7 kg)  11/02/21 169 lb (76.7 kg)     GEN: Well nourished, well developed, in no acute distress. HEENT: normal. Neck: Supple, no JVD, carotid bruits, or masses. Cardiac: RRR, no murmurs, rubs, or gallops. No clubbing, cyanosis, edema.  Radials/PT 2+ and equal bilaterally.  Respiratory:  Respirations regular and unlabored, clear to auscultation bilaterally. GI: Soft, nontender, nondistended. MS: No deformity or atrophy. Skin: Warm and dry, no rash. Neuro:  Strength and sensation are intact. Psych: Normal affect.  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 Given her dialysis status, there is no current percutaneous option to repair/replace her tricuspid valve.  - Given dialysis status, and prior sternotomy, she is felt to be too high risk for surgical TVR - Her right heart cath last year (2022) showed normal right sided filling pressures, with a v-wave consistent with significant TR - I will discuss the case with the transplant team. Given her normal right heart filling pressures,  it may be reasonable to proceed with kidney transplant. Her left and right heart function are normal, and she has no significant ischemia on stress test. - I'd like to get a treadmill stress test to objectively define her functional status, as I am not clear on how much she does on a regular basis.  -ETT per [facility] prior to transplant surgery  End-stage renal disease on hemodialysis M, W, F -stable, euvolemic on exam today -BP has not dropped -continue midodrine and sensipar         Disposition: Follow up 3 months with Fransico Him, MD or APP.  Signed, Elgie Collard, PA-C 05/15/2022, 4:46 PM [facility]

## 2022-05-15 NOTE — Patient Instructions (Addendum)
Medication Instructions:   Your physician recommends that you continue on your current medications as directed. Please refer to the Current Medication list given to you today.   *If you need a refill on your cardiac medications before your next appointment, please call your pharmacy*   Lab Work: [place]   If you have labs (blood work) drawn today and your tests are completely normal, you will receive your results only by: [place] (if you have MyChart) OR A paper copy in the mail If you have any lab test that is abnormal or we need to change your treatment, we will call you to review the results.   Testing/Procedures: WITH [employer] you will have an exercise tolerance test    Follow-Up: At [facility], you and your health needs are our priority.  As part of our continuing mission to provide you with exceptional heart care, we have created designated Provider Care Teams.  These Care Teams include your primary Cardiologist (physician) and Advanced Practice Providers (APPs -  Physician Assistants and Nurse Practitioners) who all work together to provide you with the care you need, when you need it.  We recommend signing up for the patient portal called "MyChart".  Sign up information is provided on this After Visit Summary.  MyChart is used to connect with patients for Virtual Visits (Telemedicine).  Patients are able to view lab/test results, encounter notes, upcoming appointments, etc.  Non-urgent messages can be sent to your provider as well.   To learn more about what you can do with MyChart, go to [email or web address].    Your next appointment:   3 month(s)  Provider:   Fransico Him, MD  / [facility]    Other Instructions  KEEP AN EYE ON BLOOD PRESSURE OVER NEXT WEEK OR TWO  TAKING ONE HOUR AFTER TAKING MEDICINES

## 2022-05-16 DIAGNOSIS — N186 End stage renal disease: Secondary | ICD-10-CM | POA: Diagnosis not present

## 2022-05-16 DIAGNOSIS — Z992 Dependence on renal dialysis: Secondary | ICD-10-CM | POA: Diagnosis not present

## 2022-05-16 DIAGNOSIS — D631 Anemia in chronic kidney disease: Secondary | ICD-10-CM | POA: Diagnosis not present

## 2022-05-16 DIAGNOSIS — D509 Iron deficiency anemia, unspecified: Secondary | ICD-10-CM | POA: Diagnosis not present

## 2022-05-16 DIAGNOSIS — N2581 Secondary hyperparathyroidism of renal origin: Secondary | ICD-10-CM | POA: Diagnosis not present

## 2022-05-18 DIAGNOSIS — Z992 Dependence on renal dialysis: Secondary | ICD-10-CM | POA: Diagnosis not present

## 2022-05-18 DIAGNOSIS — N2581 Secondary hyperparathyroidism of renal origin: Secondary | ICD-10-CM | POA: Diagnosis not present

## 2022-05-18 DIAGNOSIS — D509 Iron deficiency anemia, unspecified: Secondary | ICD-10-CM | POA: Diagnosis not present

## 2022-05-18 DIAGNOSIS — D631 Anemia in chronic kidney disease: Secondary | ICD-10-CM | POA: Diagnosis not present

## 2022-05-18 DIAGNOSIS — N186 End stage renal disease: Secondary | ICD-10-CM | POA: Diagnosis not present

## 2022-05-21 DIAGNOSIS — D509 Iron deficiency anemia, unspecified: Secondary | ICD-10-CM | POA: Diagnosis not present

## 2022-05-21 DIAGNOSIS — Z992 Dependence on renal dialysis: Secondary | ICD-10-CM | POA: Diagnosis not present

## 2022-05-21 DIAGNOSIS — N2581 Secondary hyperparathyroidism of renal origin: Secondary | ICD-10-CM | POA: Diagnosis not present

## 2022-05-21 DIAGNOSIS — N186 End stage renal disease: Secondary | ICD-10-CM | POA: Diagnosis not present

## 2022-05-21 DIAGNOSIS — D631 Anemia in chronic kidney disease: Secondary | ICD-10-CM | POA: Diagnosis not present

## 2022-05-22 DIAGNOSIS — L603 Nail dystrophy: Secondary | ICD-10-CM | POA: Diagnosis not present

## 2022-05-22 DIAGNOSIS — I739 Peripheral vascular disease, unspecified: Secondary | ICD-10-CM | POA: Diagnosis not present

## 2022-05-23 DIAGNOSIS — D631 Anemia in chronic kidney disease: Secondary | ICD-10-CM | POA: Diagnosis not present

## 2022-05-23 DIAGNOSIS — Z992 Dependence on renal dialysis: Secondary | ICD-10-CM | POA: Diagnosis not present

## 2022-05-23 DIAGNOSIS — N186 End stage renal disease: Secondary | ICD-10-CM | POA: Diagnosis not present

## 2022-05-23 DIAGNOSIS — N2581 Secondary hyperparathyroidism of renal origin: Secondary | ICD-10-CM | POA: Diagnosis not present

## 2022-05-23 DIAGNOSIS — D509 Iron deficiency anemia, unspecified: Secondary | ICD-10-CM | POA: Diagnosis not present

## 2022-05-25 DIAGNOSIS — N2581 Secondary hyperparathyroidism of renal origin: Secondary | ICD-10-CM | POA: Diagnosis not present

## 2022-05-25 DIAGNOSIS — D631 Anemia in chronic kidney disease: Secondary | ICD-10-CM | POA: Diagnosis not present

## 2022-05-25 DIAGNOSIS — Z992 Dependence on renal dialysis: Secondary | ICD-10-CM | POA: Diagnosis not present

## 2022-05-25 DIAGNOSIS — N186 End stage renal disease: Secondary | ICD-10-CM | POA: Diagnosis not present

## 2022-05-25 DIAGNOSIS — D509 Iron deficiency anemia, unspecified: Secondary | ICD-10-CM | POA: Diagnosis not present

## 2022-05-28 DIAGNOSIS — N2581 Secondary hyperparathyroidism of renal origin: Secondary | ICD-10-CM | POA: Diagnosis not present

## 2022-05-28 DIAGNOSIS — Z992 Dependence on renal dialysis: Secondary | ICD-10-CM | POA: Diagnosis not present

## 2022-05-28 DIAGNOSIS — D631 Anemia in chronic kidney disease: Secondary | ICD-10-CM | POA: Diagnosis not present

## 2022-05-28 DIAGNOSIS — N039 Chronic nephritic syndrome with unspecified morphologic changes: Secondary | ICD-10-CM | POA: Diagnosis not present

## 2022-05-28 DIAGNOSIS — N186 End stage renal disease: Secondary | ICD-10-CM | POA: Diagnosis not present

## 2022-05-28 DIAGNOSIS — D509 Iron deficiency anemia, unspecified: Secondary | ICD-10-CM | POA: Diagnosis not present

## 2022-05-30 DIAGNOSIS — N2581 Secondary hyperparathyroidism of renal origin: Secondary | ICD-10-CM | POA: Diagnosis not present

## 2022-05-30 DIAGNOSIS — N186 End stage renal disease: Secondary | ICD-10-CM | POA: Diagnosis not present

## 2022-05-30 DIAGNOSIS — D509 Iron deficiency anemia, unspecified: Secondary | ICD-10-CM | POA: Diagnosis not present

## 2022-05-30 DIAGNOSIS — D631 Anemia in chronic kidney disease: Secondary | ICD-10-CM | POA: Diagnosis not present

## 2022-05-30 DIAGNOSIS — Z992 Dependence on renal dialysis: Secondary | ICD-10-CM | POA: Diagnosis not present

## 2022-06-01 DIAGNOSIS — Z992 Dependence on renal dialysis: Secondary | ICD-10-CM | POA: Diagnosis not present

## 2022-06-01 DIAGNOSIS — D631 Anemia in chronic kidney disease: Secondary | ICD-10-CM | POA: Diagnosis not present

## 2022-06-01 DIAGNOSIS — N186 End stage renal disease: Secondary | ICD-10-CM | POA: Diagnosis not present

## 2022-06-01 DIAGNOSIS — N2581 Secondary hyperparathyroidism of renal origin: Secondary | ICD-10-CM | POA: Diagnosis not present

## 2022-06-01 DIAGNOSIS — D509 Iron deficiency anemia, unspecified: Secondary | ICD-10-CM | POA: Diagnosis not present

## 2022-06-04 DIAGNOSIS — Z992 Dependence on renal dialysis: Secondary | ICD-10-CM | POA: Diagnosis not present

## 2022-06-04 DIAGNOSIS — D631 Anemia in chronic kidney disease: Secondary | ICD-10-CM | POA: Diagnosis not present

## 2022-06-04 DIAGNOSIS — D509 Iron deficiency anemia, unspecified: Secondary | ICD-10-CM | POA: Diagnosis not present

## 2022-06-04 DIAGNOSIS — N2581 Secondary hyperparathyroidism of renal origin: Secondary | ICD-10-CM | POA: Diagnosis not present

## 2022-06-04 DIAGNOSIS — N186 End stage renal disease: Secondary | ICD-10-CM | POA: Diagnosis not present

## 2022-06-06 DIAGNOSIS — D631 Anemia in chronic kidney disease: Secondary | ICD-10-CM | POA: Diagnosis not present

## 2022-06-06 DIAGNOSIS — D509 Iron deficiency anemia, unspecified: Secondary | ICD-10-CM | POA: Diagnosis not present

## 2022-06-06 DIAGNOSIS — N2581 Secondary hyperparathyroidism of renal origin: Secondary | ICD-10-CM | POA: Diagnosis not present

## 2022-06-06 DIAGNOSIS — Z992 Dependence on renal dialysis: Secondary | ICD-10-CM | POA: Diagnosis not present

## 2022-06-06 DIAGNOSIS — N186 End stage renal disease: Secondary | ICD-10-CM | POA: Diagnosis not present

## 2022-06-08 DIAGNOSIS — N186 End stage renal disease: Secondary | ICD-10-CM | POA: Diagnosis not present

## 2022-06-08 DIAGNOSIS — D509 Iron deficiency anemia, unspecified: Secondary | ICD-10-CM | POA: Diagnosis not present

## 2022-06-08 DIAGNOSIS — D631 Anemia in chronic kidney disease: Secondary | ICD-10-CM | POA: Diagnosis not present

## 2022-06-08 DIAGNOSIS — Z992 Dependence on renal dialysis: Secondary | ICD-10-CM | POA: Diagnosis not present

## 2022-06-08 DIAGNOSIS — N2581 Secondary hyperparathyroidism of renal origin: Secondary | ICD-10-CM | POA: Diagnosis not present

## 2022-06-11 DIAGNOSIS — D509 Iron deficiency anemia, unspecified: Secondary | ICD-10-CM | POA: Diagnosis not present

## 2022-06-11 DIAGNOSIS — N186 End stage renal disease: Secondary | ICD-10-CM | POA: Diagnosis not present

## 2022-06-11 DIAGNOSIS — N2581 Secondary hyperparathyroidism of renal origin: Secondary | ICD-10-CM | POA: Diagnosis not present

## 2022-06-11 DIAGNOSIS — D631 Anemia in chronic kidney disease: Secondary | ICD-10-CM | POA: Diagnosis not present

## 2022-06-11 DIAGNOSIS — Z992 Dependence on renal dialysis: Secondary | ICD-10-CM | POA: Diagnosis not present

## 2022-06-13 DIAGNOSIS — Z992 Dependence on renal dialysis: Secondary | ICD-10-CM | POA: Diagnosis not present

## 2022-06-13 DIAGNOSIS — D509 Iron deficiency anemia, unspecified: Secondary | ICD-10-CM | POA: Diagnosis not present

## 2022-06-13 DIAGNOSIS — D631 Anemia in chronic kidney disease: Secondary | ICD-10-CM | POA: Diagnosis not present

## 2022-06-13 DIAGNOSIS — N2581 Secondary hyperparathyroidism of renal origin: Secondary | ICD-10-CM | POA: Diagnosis not present

## 2022-06-13 DIAGNOSIS — N186 End stage renal disease: Secondary | ICD-10-CM | POA: Diagnosis not present

## 2022-06-14 DIAGNOSIS — T82858A Stenosis of vascular prosthetic devices, implants and grafts, initial encounter: Secondary | ICD-10-CM | POA: Diagnosis not present

## 2022-06-14 DIAGNOSIS — N186 End stage renal disease: Secondary | ICD-10-CM | POA: Diagnosis not present

## 2022-06-14 DIAGNOSIS — I871 Compression of vein: Secondary | ICD-10-CM | POA: Diagnosis not present

## 2022-06-14 DIAGNOSIS — Z992 Dependence on renal dialysis: Secondary | ICD-10-CM | POA: Diagnosis not present

## 2022-06-15 DIAGNOSIS — D509 Iron deficiency anemia, unspecified: Secondary | ICD-10-CM | POA: Diagnosis not present

## 2022-06-15 DIAGNOSIS — Z992 Dependence on renal dialysis: Secondary | ICD-10-CM | POA: Diagnosis not present

## 2022-06-15 DIAGNOSIS — N2581 Secondary hyperparathyroidism of renal origin: Secondary | ICD-10-CM | POA: Diagnosis not present

## 2022-06-15 DIAGNOSIS — N186 End stage renal disease: Secondary | ICD-10-CM | POA: Diagnosis not present

## 2022-06-15 DIAGNOSIS — D631 Anemia in chronic kidney disease: Secondary | ICD-10-CM | POA: Diagnosis not present

## 2022-06-18 DIAGNOSIS — N186 End stage renal disease: Secondary | ICD-10-CM | POA: Diagnosis not present

## 2022-06-18 DIAGNOSIS — D509 Iron deficiency anemia, unspecified: Secondary | ICD-10-CM | POA: Diagnosis not present

## 2022-06-18 DIAGNOSIS — Z992 Dependence on renal dialysis: Secondary | ICD-10-CM | POA: Diagnosis not present

## 2022-06-18 DIAGNOSIS — D631 Anemia in chronic kidney disease: Secondary | ICD-10-CM | POA: Diagnosis not present

## 2022-06-18 DIAGNOSIS — N2581 Secondary hyperparathyroidism of renal origin: Secondary | ICD-10-CM | POA: Diagnosis not present

## 2022-06-20 DIAGNOSIS — Z992 Dependence on renal dialysis: Secondary | ICD-10-CM | POA: Diagnosis not present

## 2022-06-20 DIAGNOSIS — N2581 Secondary hyperparathyroidism of renal origin: Secondary | ICD-10-CM | POA: Diagnosis not present

## 2022-06-20 DIAGNOSIS — D631 Anemia in chronic kidney disease: Secondary | ICD-10-CM | POA: Diagnosis not present

## 2022-06-20 DIAGNOSIS — N186 End stage renal disease: Secondary | ICD-10-CM | POA: Diagnosis not present

## 2022-06-20 DIAGNOSIS — D509 Iron deficiency anemia, unspecified: Secondary | ICD-10-CM | POA: Diagnosis not present

## 2022-06-22 DIAGNOSIS — Z992 Dependence on renal dialysis: Secondary | ICD-10-CM | POA: Diagnosis not present

## 2022-06-22 DIAGNOSIS — N186 End stage renal disease: Secondary | ICD-10-CM | POA: Diagnosis not present

## 2022-06-22 DIAGNOSIS — N2581 Secondary hyperparathyroidism of renal origin: Secondary | ICD-10-CM | POA: Diagnosis not present

## 2022-06-22 DIAGNOSIS — D509 Iron deficiency anemia, unspecified: Secondary | ICD-10-CM | POA: Diagnosis not present

## 2022-06-22 DIAGNOSIS — D631 Anemia in chronic kidney disease: Secondary | ICD-10-CM | POA: Diagnosis not present

## 2022-06-25 DIAGNOSIS — D631 Anemia in chronic kidney disease: Secondary | ICD-10-CM | POA: Diagnosis not present

## 2022-06-25 DIAGNOSIS — N186 End stage renal disease: Secondary | ICD-10-CM | POA: Diagnosis not present

## 2022-06-25 DIAGNOSIS — N2581 Secondary hyperparathyroidism of renal origin: Secondary | ICD-10-CM | POA: Diagnosis not present

## 2022-06-25 DIAGNOSIS — D509 Iron deficiency anemia, unspecified: Secondary | ICD-10-CM | POA: Diagnosis not present

## 2022-06-25 DIAGNOSIS — Z992 Dependence on renal dialysis: Secondary | ICD-10-CM | POA: Diagnosis not present

## 2022-06-27 DIAGNOSIS — N186 End stage renal disease: Secondary | ICD-10-CM | POA: Diagnosis not present

## 2022-06-27 DIAGNOSIS — N2581 Secondary hyperparathyroidism of renal origin: Secondary | ICD-10-CM | POA: Diagnosis not present

## 2022-06-27 DIAGNOSIS — D509 Iron deficiency anemia, unspecified: Secondary | ICD-10-CM | POA: Diagnosis not present

## 2022-06-27 DIAGNOSIS — Z992 Dependence on renal dialysis: Secondary | ICD-10-CM | POA: Diagnosis not present

## 2022-06-27 DIAGNOSIS — D631 Anemia in chronic kidney disease: Secondary | ICD-10-CM | POA: Diagnosis not present

## 2022-06-27 DIAGNOSIS — N039 Chronic nephritic syndrome with unspecified morphologic changes: Secondary | ICD-10-CM | POA: Diagnosis not present

## 2022-06-29 DIAGNOSIS — N186 End stage renal disease: Secondary | ICD-10-CM | POA: Diagnosis not present

## 2022-06-29 DIAGNOSIS — Z992 Dependence on renal dialysis: Secondary | ICD-10-CM | POA: Diagnosis not present

## 2022-06-29 DIAGNOSIS — D509 Iron deficiency anemia, unspecified: Secondary | ICD-10-CM | POA: Diagnosis not present

## 2022-06-29 DIAGNOSIS — N2581 Secondary hyperparathyroidism of renal origin: Secondary | ICD-10-CM | POA: Diagnosis not present

## 2022-06-29 DIAGNOSIS — D631 Anemia in chronic kidney disease: Secondary | ICD-10-CM | POA: Diagnosis not present

## 2022-07-02 DIAGNOSIS — Z992 Dependence on renal dialysis: Secondary | ICD-10-CM | POA: Diagnosis not present

## 2022-07-02 DIAGNOSIS — D631 Anemia in chronic kidney disease: Secondary | ICD-10-CM | POA: Diagnosis not present

## 2022-07-02 DIAGNOSIS — N186 End stage renal disease: Secondary | ICD-10-CM | POA: Diagnosis not present

## 2022-07-02 DIAGNOSIS — N2581 Secondary hyperparathyroidism of renal origin: Secondary | ICD-10-CM | POA: Diagnosis not present

## 2022-07-02 DIAGNOSIS — D509 Iron deficiency anemia, unspecified: Secondary | ICD-10-CM | POA: Diagnosis not present

## 2022-07-04 DIAGNOSIS — N2581 Secondary hyperparathyroidism of renal origin: Secondary | ICD-10-CM | POA: Diagnosis not present

## 2022-07-04 DIAGNOSIS — N186 End stage renal disease: Secondary | ICD-10-CM | POA: Diagnosis not present

## 2022-07-04 DIAGNOSIS — Z992 Dependence on renal dialysis: Secondary | ICD-10-CM | POA: Diagnosis not present

## 2022-07-04 DIAGNOSIS — D509 Iron deficiency anemia, unspecified: Secondary | ICD-10-CM | POA: Diagnosis not present

## 2022-07-04 DIAGNOSIS — D631 Anemia in chronic kidney disease: Secondary | ICD-10-CM | POA: Diagnosis not present

## 2022-07-06 DIAGNOSIS — N2581 Secondary hyperparathyroidism of renal origin: Secondary | ICD-10-CM | POA: Diagnosis not present

## 2022-07-06 DIAGNOSIS — D509 Iron deficiency anemia, unspecified: Secondary | ICD-10-CM | POA: Diagnosis not present

## 2022-07-06 DIAGNOSIS — D631 Anemia in chronic kidney disease: Secondary | ICD-10-CM | POA: Diagnosis not present

## 2022-07-06 DIAGNOSIS — Z992 Dependence on renal dialysis: Secondary | ICD-10-CM | POA: Diagnosis not present

## 2022-07-06 DIAGNOSIS — N186 End stage renal disease: Secondary | ICD-10-CM | POA: Diagnosis not present

## 2022-07-09 DIAGNOSIS — D509 Iron deficiency anemia, unspecified: Secondary | ICD-10-CM | POA: Diagnosis not present

## 2022-07-09 DIAGNOSIS — D631 Anemia in chronic kidney disease: Secondary | ICD-10-CM | POA: Diagnosis not present

## 2022-07-09 DIAGNOSIS — N186 End stage renal disease: Secondary | ICD-10-CM | POA: Diagnosis not present

## 2022-07-09 DIAGNOSIS — N2581 Secondary hyperparathyroidism of renal origin: Secondary | ICD-10-CM | POA: Diagnosis not present

## 2022-07-09 DIAGNOSIS — Z992 Dependence on renal dialysis: Secondary | ICD-10-CM | POA: Diagnosis not present

## 2022-07-11 DIAGNOSIS — D631 Anemia in chronic kidney disease: Secondary | ICD-10-CM | POA: Diagnosis not present

## 2022-07-11 DIAGNOSIS — N186 End stage renal disease: Secondary | ICD-10-CM | POA: Diagnosis not present

## 2022-07-11 DIAGNOSIS — Z992 Dependence on renal dialysis: Secondary | ICD-10-CM | POA: Diagnosis not present

## 2022-07-11 DIAGNOSIS — D509 Iron deficiency anemia, unspecified: Secondary | ICD-10-CM | POA: Diagnosis not present

## 2022-07-11 DIAGNOSIS — N2581 Secondary hyperparathyroidism of renal origin: Secondary | ICD-10-CM | POA: Diagnosis not present

## 2022-07-13 DIAGNOSIS — N2581 Secondary hyperparathyroidism of renal origin: Secondary | ICD-10-CM | POA: Diagnosis not present

## 2022-07-13 DIAGNOSIS — D509 Iron deficiency anemia, unspecified: Secondary | ICD-10-CM | POA: Diagnosis not present

## 2022-07-13 DIAGNOSIS — Z992 Dependence on renal dialysis: Secondary | ICD-10-CM | POA: Diagnosis not present

## 2022-07-13 DIAGNOSIS — N186 End stage renal disease: Secondary | ICD-10-CM | POA: Diagnosis not present

## 2022-07-13 DIAGNOSIS — D631 Anemia in chronic kidney disease: Secondary | ICD-10-CM | POA: Diagnosis not present

## 2022-07-16 DIAGNOSIS — N2581 Secondary hyperparathyroidism of renal origin: Secondary | ICD-10-CM | POA: Diagnosis not present

## 2022-07-16 DIAGNOSIS — D631 Anemia in chronic kidney disease: Secondary | ICD-10-CM | POA: Diagnosis not present

## 2022-07-16 DIAGNOSIS — Z992 Dependence on renal dialysis: Secondary | ICD-10-CM | POA: Diagnosis not present

## 2022-07-16 DIAGNOSIS — D509 Iron deficiency anemia, unspecified: Secondary | ICD-10-CM | POA: Diagnosis not present

## 2022-07-16 DIAGNOSIS — N186 End stage renal disease: Secondary | ICD-10-CM | POA: Diagnosis not present

## 2022-07-18 DIAGNOSIS — D631 Anemia in chronic kidney disease: Secondary | ICD-10-CM | POA: Diagnosis not present

## 2022-07-18 DIAGNOSIS — N186 End stage renal disease: Secondary | ICD-10-CM | POA: Diagnosis not present

## 2022-07-18 DIAGNOSIS — Z992 Dependence on renal dialysis: Secondary | ICD-10-CM | POA: Diagnosis not present

## 2022-07-18 DIAGNOSIS — D509 Iron deficiency anemia, unspecified: Secondary | ICD-10-CM | POA: Diagnosis not present

## 2022-07-18 DIAGNOSIS — N2581 Secondary hyperparathyroidism of renal origin: Secondary | ICD-10-CM | POA: Diagnosis not present

## 2022-07-20 DIAGNOSIS — Z992 Dependence on renal dialysis: Secondary | ICD-10-CM | POA: Diagnosis not present

## 2022-07-20 DIAGNOSIS — N2581 Secondary hyperparathyroidism of renal origin: Secondary | ICD-10-CM | POA: Diagnosis not present

## 2022-07-20 DIAGNOSIS — N186 End stage renal disease: Secondary | ICD-10-CM | POA: Diagnosis not present

## 2022-07-20 DIAGNOSIS — D509 Iron deficiency anemia, unspecified: Secondary | ICD-10-CM | POA: Diagnosis not present

## 2022-07-20 DIAGNOSIS — D631 Anemia in chronic kidney disease: Secondary | ICD-10-CM | POA: Diagnosis not present

## 2022-07-23 DIAGNOSIS — D509 Iron deficiency anemia, unspecified: Secondary | ICD-10-CM | POA: Diagnosis not present

## 2022-07-23 DIAGNOSIS — N2581 Secondary hyperparathyroidism of renal origin: Secondary | ICD-10-CM | POA: Diagnosis not present

## 2022-07-23 DIAGNOSIS — D631 Anemia in chronic kidney disease: Secondary | ICD-10-CM | POA: Diagnosis not present

## 2022-07-23 DIAGNOSIS — N186 End stage renal disease: Secondary | ICD-10-CM | POA: Diagnosis not present

## 2022-07-23 DIAGNOSIS — Z992 Dependence on renal dialysis: Secondary | ICD-10-CM | POA: Diagnosis not present

## 2022-07-25 DIAGNOSIS — N2581 Secondary hyperparathyroidism of renal origin: Secondary | ICD-10-CM | POA: Diagnosis not present

## 2022-07-25 DIAGNOSIS — N186 End stage renal disease: Secondary | ICD-10-CM | POA: Diagnosis not present

## 2022-07-25 DIAGNOSIS — D509 Iron deficiency anemia, unspecified: Secondary | ICD-10-CM | POA: Diagnosis not present

## 2022-07-25 DIAGNOSIS — Z992 Dependence on renal dialysis: Secondary | ICD-10-CM | POA: Diagnosis not present

## 2022-07-25 DIAGNOSIS — D631 Anemia in chronic kidney disease: Secondary | ICD-10-CM | POA: Diagnosis not present

## 2022-07-27 ENCOUNTER — Ambulatory Visit (HOSPITAL_COMMUNITY)
Admission: EM | Admit: 2022-07-27 | Discharge: 2022-07-27 | Disposition: A | Discharge status: Home / Self Care | Payer: Medicare Other

## 2022-07-27 ENCOUNTER — Encounter (HOSPITAL_COMMUNITY): Payer: Self-pay | Admitting: *Deleted

## 2022-07-27 ENCOUNTER — Emergency Department (HOSPITAL_COMMUNITY): Payer: Medicare Other

## 2022-07-27 ENCOUNTER — Other Ambulatory Visit: Payer: Self-pay

## 2022-07-27 ENCOUNTER — Emergency Department (HOSPITAL_COMMUNITY)
Admission: EM | Admit: 2022-07-27 | Discharge: 2022-07-27 | Disposition: A | Discharge status: Home / Self Care | Payer: Medicare Other | Attending: Emergency Medicine | Admitting: Emergency Medicine

## 2022-07-27 DIAGNOSIS — R1030 Lower abdominal pain, unspecified: Secondary | ICD-10-CM

## 2022-07-27 DIAGNOSIS — R1031 Right lower quadrant pain: Secondary | ICD-10-CM | POA: Insufficient documentation

## 2022-07-27 DIAGNOSIS — R109 Unspecified abdominal pain: Secondary | ICD-10-CM

## 2022-07-27 DIAGNOSIS — Z992 Dependence on renal dialysis: Secondary | ICD-10-CM | POA: Diagnosis not present

## 2022-07-27 DIAGNOSIS — N186 End stage renal disease: Secondary | ICD-10-CM | POA: Diagnosis not present

## 2022-07-27 DIAGNOSIS — N2581 Secondary hyperparathyroidism of renal origin: Secondary | ICD-10-CM | POA: Diagnosis not present

## 2022-07-27 DIAGNOSIS — D631 Anemia in chronic kidney disease: Secondary | ICD-10-CM | POA: Diagnosis not present

## 2022-07-27 DIAGNOSIS — M545 Low back pain, unspecified: Secondary | ICD-10-CM | POA: Diagnosis not present

## 2022-07-27 DIAGNOSIS — D509 Iron deficiency anemia, unspecified: Secondary | ICD-10-CM | POA: Diagnosis not present

## 2022-07-27 LAB — CBC
HCT: 32.8 % — ABNORMAL LOW (ref 36.0–46.0)
Hemoglobin: 10.7 g/dL — ABNORMAL LOW (ref 12.0–15.0)
MCH: 29.6 pg (ref 26.0–34.0)
MCHC: 32.6 g/dL (ref 30.0–36.0)
MCV: 90.9 fL (ref 80.0–100.0)
Platelets: 172 10*3/uL (ref 150–400)
RBC: 3.61 MIL/uL — ABNORMAL LOW (ref 3.87–5.11)
RDW: 17.1 % — ABNORMAL HIGH (ref 11.5–15.5)
WBC: 4.2 10*3/uL (ref 4.0–10.5)
nRBC: 0 % (ref 0.0–0.2)

## 2022-07-27 LAB — COMPREHENSIVE METABOLIC PANEL
ALT: 15 U/L (ref 0–44)
AST: 18 U/L (ref 15–41)
Albumin: 3.8 g/dL (ref 3.5–5.0)
Alkaline Phosphatase: 171 U/L — ABNORMAL HIGH (ref 38–126)
Anion gap: 12 (ref 5–15)
BUN: 13 mg/dL (ref 8–23)
CO2: 25 mmol/L (ref 22–32)
Calcium: 8.2 mg/dL — ABNORMAL LOW (ref 8.9–10.3)
Chloride: 95 mmol/L — ABNORMAL LOW (ref 98–111)
Creatinine, Ser: 5.29 mg/dL — ABNORMAL HIGH (ref 0.44–1.00)
GFR, Estimated: 8 mL/min — ABNORMAL LOW (ref >60)
Glucose, Bld: 89 mg/dL (ref 70–99)
Potassium: 3.8 mmol/L (ref 3.5–5.1)
Sodium: 132 mmol/L — ABNORMAL LOW (ref 135–145)
Total Bilirubin: 0.3 mg/dL (ref 0.3–1.2)
Total Protein: 7 g/dL (ref 6.5–8.1)

## 2022-07-27 LAB — LIPASE, BLOOD: Lipase: 56 U/L — ABNORMAL HIGH (ref 11–51)

## 2022-07-27 NOTE — ED Provider Notes (Signed)
[facility]    CSN: [id number] Arrival date & time: 07/27/22  1310      History   Chief Complaint Chief Complaint  Patient presents with   Back Pain   Flank Pain   Nausea    HPI Amere A [place] is a 67 y.o. female.   Patient presents today with a several hour history of severe right-sided abdominal pain.  She reports that for the past several days she has had intermittent lower back pain but did not think much of this.  Today she developed severe right-sided abdominal pain that caused her to leave dialysis after only 2 hours and she typically receives treatment for 4 hours.  She does not create urine.  She reports significant nausea but denies any vomiting or diarrhea.  Has not tried any over-the-counter medication for symptom management other than Tylenol for the backache which was ineffective.  She reports having her gallbladder removed many years ago but has not had additional surgery on her abdomen.    Past Medical History:  Diagnosis Date   Anemia    Aortic valve prosthesis present 02/25/2016   Arthritis    "knees" (01/03/2017)   AVD (aortic valve disease) 07/12/2016   Backache 12/06/2008   Bacteremia due to coagulase-negative Staphylococcus    Carpal tunnel syndrome    Cerebral embolism with transient ischemic attack (TIA)    Cholelithiases 01/28/2017   Chronic female pelvic pain 08/08/2012   Complication of anesthesia    01/01/17- '"a long time ago, difficulty breathimg, not sure if it was due to anesthesia or not."   CVA (cerebral vascular accident) (HCC) 05/16/2016   End stage renal disease on dialysis (HCC)    "MWF; [address] (01/03/2017)   Endocarditis    Esophageal reflux 12/06/2008   GERD (gastroesophageal reflux disease)    Gout    History of blood transfusion 2017   "related to blood poison" (01/03/2017)   Increased endometrial stripe thickness 11/03/2015   Neck pain    Osteoporosis 09/2016   T score -2.6   Pain and swelling of right upper  extremity 12/20/2015   Pulmonary HTN (HCC) 07/12/2016   Renal dialysis device, implant, or graft complication 12/20/2015   Renal insufficiency    S/P cholecystectomy 02/14/2017   Septic shock (HCC)    Staphylococcus aureus bacteremia    TIA (transient ischemic attack)    "several" (01/03/2017)    Patient Active Problem List   Diagnosis Date Noted   Personal history of colonic polyps 05/16/2021   Endocarditis 07/30/2020   Hyperparathyroidism due to renal insufficiency (HCC) 07/30/2020   Tooth, broken 12/18/2018   Hypotension 10/03/2018   Diarrhea, unspecified 08/25/2018   Orthostatic hypotension 03/14/2018   S/P cholecystectomy 02/14/2017   Osteoporosis 12/11/2016   Cholelithiasis 11/07/2016   Cerebral embolism with transient ischemic attack (TIA)    Bilateral carpal tunnel syndrome 08/09/2016   AVD (aortic valve disease) 07/12/2016   Pulmonary HTN (HCC) 07/12/2016   Carpal tunnel syndrome 07/05/2016   Disorder of both mastoids 05/29/2016   CVA (cerebral vascular accident) (HCC) 05/16/2016   History of aortic valve replacement with bioprosthetic valve 05/08/2016   Bacteremia due to coagulase-negative Staphylococcus    History of endocarditis    Anemia    Complex endometrial hyperplasia with atypia 11/08/2015   Dependence on renal dialysis (HCC) 12/15/2014   Obesity 10/28/2014   Right carotid bruit 01/08/2013   Generalized headaches 01/08/2013   Chronic female pelvic pain 08/08/2012   Gout, unspecified 12/06/2008  Esophageal reflux 12/06/2008   Backache 12/06/2008   FIBROIDS, UTERUS 11/24/2008   Diverticulosis of large intestine without perforation or abscess without bleeding 03/22/2008   ESRD (end stage renal disease) on dialysis (HCC) 07/01/2006   Anemia in chronic kidney disease 12/18/2001    Past Surgical History:  Procedure Laterality Date   AORTIC VALVE REPLACEMENT N/A 02/25/2016   Procedure: AORTIC VALVE REPLACEMENT (AVR) implanted with Magna Ease Aortic valve  size 21mm;  Surgeon: Loreli Slot, MD;  Location: [facility] OR;  Service: Open Heart Surgery;  Laterality: N/A;   AV FISTULA PLACEMENT Left 01/03/2017   Procedure: INSERTION OF ARTERIOVENOUS (AV) GORE-TEX GRAFT LEFT THIGH;  Surgeon: Nada Libman, MD;  Location: [facility] OR;  Service: Vascular;  Laterality: Left;   AVGG REMOVAL Right 02/17/2016   Procedure: REMOVAL OF TWO ARTERIOVENOUS GORETEX GRAFTS (AVGG);  Surgeon: Chuck Hint, MD;  Location: [facility] OR;  Service: Vascular;  Laterality: Right;   BIOPSY  05/16/2021   Procedure: BIOPSY;  Surgeon: Charlott Rakes, MD;  Location: [facility];  Service: Endoscopy;;   BREAST BIOPSY Left 2013   stereo    BREAST BIOPSY Right 2011   stereo    CARDIAC VALVE REPLACEMENT     CHOLECYSTECTOMY N/A 01/30/2017   Procedure: LAPAROSCOPIC CHOLECYSTECTOMY;  Surgeon: Harriette Bouillon, MD;  Location: [facility] OR;  Service: General;  Laterality: N/A;   COLONOSCOPY W/ POLYPECTOMY     COLONOSCOPY WITH PROPOFOL N/A 05/16/2021   Procedure: COLONOSCOPY WITH PROPOFOL;  Surgeon: Charlott Rakes, MD;  Location: [facility];  Service: Endoscopy;  Laterality: N/A;   DG AV DIALYSIS GRAFT DECLOT OR     DILATATION & CURETTAGE/HYSTEROSCOPY WITH TRUECLEAR N/A 11/06/2012   Procedure: DILATATION & CURETTAGE/HYSTEROSCOPY WITH TRUECLEAR;  Surgeon: Ok Edwards, MD;  Location: [facility] ORS;  Service: Gynecology;  Laterality: N/A;  Truclear Resectoscopic Polypectomy    INSERTION OF DIALYSIS CATHETER N/A 02/19/2016   Procedure: INSERTION OF Left Internal Jugular DIALYSIS CATHETER;  Surgeon: Chuck Hint, MD;  Location: [facility] OR;  Service: Vascular;  Laterality: N/A;   LOOP RECORDER INSERTION N/A 10/01/2016   Procedure: LOOP RECORDER INSERTION;  Surgeon: Regan Lemming, MD;  [facility];  Service: Cardiovascular;  Laterality: N/A;   PATCH ANGIOPLASTY Right 02/17/2016   Procedure: PATCH ANGIOPLASTY;  Surgeon: Chuck Hint, MD;  Location: [facility] OR;  Service:  Vascular;  Laterality: Right;   PERIPHERAL VASCULAR CATHETERIZATION N/A 09/14/2014   Procedure: A/V Shuntogram/Fistulagram;  Surgeon: Renford Dills, MD;  Location: [facility];  Service: Cardiovascular;  Laterality: N/A;   PERIPHERAL VASCULAR CATHETERIZATION N/A 09/14/2014   Procedure: A/V Shunt Intervention;  Surgeon: Renford Dills, MD;  Location: [facility];  Service: Cardiovascular;  Laterality: N/A;   PERIPHERAL VASCULAR CATHETERIZATION Right 12/09/2014   Procedure: A/V Shuntogram/Fistulagram;  Surgeon: Annice Needy, MD;  Location: [facility];  Service: Cardiovascular;  Laterality: Right;   PERIPHERAL VASCULAR CATHETERIZATION N/A 12/09/2014   Procedure: A/V Shunt Intervention;  Surgeon: Annice Needy, MD;  Location: [facility];  Service: Cardiovascular;  Laterality: N/A;   PERIPHERAL VASCULAR CATHETERIZATION Right 05/24/2015   Procedure: A/V Shuntogram;  Surgeon: Nada Libman, MD;  [facility];  Service: Cardiovascular;  Laterality: Right;   PERIPHERAL VASCULAR CATHETERIZATION Right 05/24/2015   Procedure: Peripheral Vascular Balloon Angioplasty;  Surgeon: Nada Libman, MD;  [facility];  Service: Cardiovascular;  Laterality: Right;  right arm shunt  PERIPHERAL VASCULAR CATHETERIZATION N/A 06/13/2015   Procedure: A/V Shuntogram/Fistulagram;  Surgeon: Annice Needy, MD;  Location: [facility];  Service: Cardiovascular;  Laterality: N/A;   PERIPHERAL VASCULAR CATHETERIZATION N/A 06/13/2015   Procedure: A/V Shunt Intervention;  Surgeon: Annice Needy, MD;  Location: [facility];  Service: Cardiovascular;  Laterality: N/A;   TEE WITHOUT CARDIOVERSION N/A 02/22/2016   Procedure: TRANSESOPHAGEAL ECHOCARDIOGRAM (TEE);  Surgeon: Chrystie Nose, MD;  Location: [facility] ENDOSCOPY;  Service: Cardiovascular;  Laterality: N/A;   TEE WITHOUT CARDIOVERSION N/A 02/25/2016   Procedure: TRANSESOPHAGEAL ECHOCARDIOGRAM (TEE);   Surgeon: Loreli Slot, MD;  Location: [facility] OR;  Service: Open Heart Surgery;  Laterality: N/A;   TEE WITHOUT CARDIOVERSION N/A 10/01/2016   Procedure: TRANSESOPHAGEAL ECHOCARDIOGRAM (TEE);  Surgeon: Chrystie Nose, MD;  Location: [facility] ENDOSCOPY;  Service: Cardiovascular;  Laterality: N/A;   TUBAL LIGATION  1983    OB History     Gravida  2   Para  2   Term  2   Preterm      AB      Living  2      SAB      IAB      Ectopic      Multiple      Live Births               Home Medications    Prior to Admission medications   Medication Sig Start Date End Date Taking? Authorizing Provider  cinacalcet (SENSIPAR) 90 MG tablet Take 90 mg by mouth at bedtime.   Yes [provider]  clopidogrel (PLAVIX) 75 MG tablet Take 1 tablet (75 mg total) by mouth daily. 05/15/22  Yes Conte, Tessa N, PA-C  famotidine (PEPCID) 20 MG tablet Take 20 mg by mouth daily after supper.   Yes [provider]  midodrine (PROAMATINE) 10 MG tablet Take 10 mg by mouth See admin instructions. Take 1 tablet (10 mg) by mouth before dialysis & take 1 tablet (10 mg) while at dialysis on Mondays, Wednesdays & Fridays. 08/15/20  Yes [provider]  Multiple Vitamins-Minerals (PRORENAL QD PO) Take 1 tablet by mouth at bedtime.    Yes [provider]  sevelamer carbonate (RENVELA) 800 MG tablet Take 2,400 mg by mouth See admin instructions. Take 3 tablets (2400 mg) by mouth after each meal and after each snack   Yes [provider]  gabapentin (NEURONTIN) 100 MG capsule Take by mouth. 05/30/22   [provider]    Family History Family History  Problem Relation Age of Onset   Diabetes Mother    Hypertension Mother    Heart disease Mother    Alcohol abuse Mother    Kidney disease Mother    Cancer Father        COLON   Alcohol abuse Father    Breast cancer Sister 70   Hypertension Sister    Kidney disease Sister    Breast cancer Sister         breast cancer   Hypertension Son    Kidney disease Son     Social History Social History   Tobacco Use   Smoking status: Never   Smokeless tobacco: Never  Vaping Use   Vaping Use: Never used  Substance Use Topics   Alcohol use: No   Drug use: No     Allergies   Iodinated contrast media, Cefazolin, and Naproxen sodium   Review of Systems Review of  Systems  Constitutional:  Positive for activity change. Negative for appetite change, fatigue and fever.  Respiratory:  Negative for cough and shortness of breath.   Cardiovascular:  Negative for chest pain.  Gastrointestinal:  Positive for abdominal pain and nausea. Negative for diarrhea and vomiting.  Neurological:  Negative for dizziness, weakness, light-headedness and headaches.     Physical Exam Triage Vital Signs ED Triage Vitals  Enc Vitals Group     BP 07/27/22 1340 (!) 115/57     Pulse Rate 07/27/22 1340 (!) 57     Resp 07/27/22 1340 18     Temp 07/27/22 1340 97.7 F (36.5 C)     Temp Source 07/27/22 1340 Oral     SpO2 07/27/22 1340 99 %     Weight --      Height --      Head Circumference --      Peak Flow --      Pain Score 07/27/22 1337 10     Pain Loc --      Pain Edu? --      Excl. in GC? --    No data found.  Updated Vital Signs BP (!) 115/57 (BP Location: Right Leg)   Pulse (!) 57   Temp 97.7 F (36.5 C) (Oral)   Resp 18   SpO2 99%   Visual Acuity Right Eye Distance:   Left Eye Distance:   Bilateral Distance:    Right Eye Near:   Left Eye Near:    Bilateral Near:     Physical Exam Vitals reviewed.  Constitutional:      General: She is awake. She is not in acute distress.    Appearance: Normal appearance. She is well-developed. She is ill-appearing.     Comments: Very pleasant female appears stated age doubled over in pain sitting in exam room  HENT:     Head: Normocephalic and atraumatic.  Cardiovascular:     Rate and Rhythm: Normal rate and regular rhythm.     Heart sounds:  Normal heart sounds, S1 normal and S2 normal. No murmur heard. Pulmonary:     Effort: Pulmonary effort is normal.     Breath sounds: Normal breath sounds. No wheezing, rhonchi or rales.     Comments: Clear to auscultation bilaterally Abdominal:     General: Bowel sounds are normal.     Palpations: Abdomen is soft.     Tenderness: There is abdominal tenderness in the right upper quadrant and right lower quadrant. There is right CVA tenderness, left CVA tenderness and guarding. There is no rebound.     Comments: Bilateral CVA tenderness.  Guarding and significant pain over right abdomen.  Psychiatric:        Behavior: Behavior is cooperative.      UC Treatments / Results  Labs (all labs ordered are listed, but only abnormal results are displayed) Labs Reviewed - No data to display  EKG   Radiology No results found.  Procedures Procedures (including critical care time)  Medications Ordered in UC Medications - No data to display  Initial Impression / Assessment and Plan / UC Course  I have reviewed the triage vital signs and the nursing notes.  Pertinent labs & imaging results that were available during my care of the patient were reviewed by me and considered in my medical decision making (see chart for details).     Patient's vital signs are stable but she has significant pain on exam.  Discussed that given her  past medical history and 10 out of 10 pain I recommend she go to the emergency room for further evaluation and management since we do not have CT capabilities in urgent care.  She is agreeable and will go directly to [facility], ER for further evaluation and management.  She declined ambulance transport.  She was stable at time of discharge.  Final Clinical Impressions(s) / UC Diagnoses   Final diagnoses:  Sudden onset of severe abdominal pain  Right sided abdominal pain   Discharge Instructions   None    ED Prescriptions   None    PDMP not reviewed this  encounter.   Jeani Hawking, PA-C 07/27/22 1354

## 2022-07-27 NOTE — ED Triage Notes (Signed)
Pt states she had back pain starting Monday and now its radiating around to her right side, She is having some nausea. She is a dialysis patient. She isnt taking any meds for the sx.

## 2022-07-27 NOTE — ED Triage Notes (Signed)
Patient comes from [facility] for RLQ pain that started in the back goes around to the front. No fever, but endorses chills. Patient received just over half dialysis treatment this week because of the pain. UC sent her for CT scan.

## 2022-07-27 NOTE — ED Provider Notes (Addendum)
[name] EMERGENCY DEPARTMENT AT [facility] Provider Note   CSN: [id number] Arrival date & time: 07/27/22  1402     History  Chief Complaint  Patient presents with   Abdominal Pain    [name] Marks is a 68 y.o. female.   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  This prompted her to leave her dialysis session halfway through her 4-hour session.  She states she may have had some nausea but no episodes of emesis no diarrhea or constipation.  She has not taken any medications for her symptoms today.  She states she does sometimes try to treat her back pain with Tylenol.  She denies any fevers or chest pain no lightheadedness or dizziness.  She states she feels well now her symptoms are completely resolved.     Home Medications Prior to Admission medications   Medication Sig Start Date End Date Taking? Authorizing Provider  cinacalcet (SENSIPAR) 90 MG tablet Take 90 mg by mouth at bedtime.    [provider]  clopidogrel (PLAVIX) 75 MG tablet Take 1 tablet (75 mg total) by mouth daily. 05/15/22   Sharlene Dory, PA-C  famotidine (PEPCID) 20 MG tablet Take 20 mg by mouth daily after supper.    [provider]  gabapentin (NEURONTIN) 100 MG capsule Take by mouth. 05/30/22   [provider]  midodrine (PROAMATINE) 10 MG tablet Take 10 mg by mouth See admin instructions. Take 1 tablet (10 mg) by mouth before dialysis & take 1 tablet (10 mg) while at dialysis on Mondays, Wednesdays & Fridays. 08/15/20   [provider]  Multiple Vitamins-Minerals (PRORENAL QD PO) Take 1 tablet by mouth at bedtime.     [provider]  sevelamer carbonate (RENVELA) 800 MG tablet Take 2,400 mg by mouth See admin instructions. Take 3 tablets (2400 mg) by mouth after each meal and after each snack    [provider]      Allergies    Iodinated contrast media, Cefazolin, and Naproxen sodium    Review of Systems   Review of Systems  Gastrointestinal:  Positive for abdominal pain.    Physical Exam Updated Vital Signs BP 112/64   Pulse (!) 59   Temp 97.7 F (36.5 C) (Oral)   Resp 18   Ht 4\' 11"  (1.499 m)   Wt 68 kg   SpO2 100%   BMI 30.30 kg/m  Physical Exam Vitals and nursing note reviewed.  Constitutional:      General: She is not in acute distress. HENT:     Head: Normocephalic and atraumatic.     Nose: Nose normal.     Mouth/Throat:     Mouth: Mucous membranes are moist.  Eyes:     General: No scleral icterus. Cardiovascular:     Rate and Rhythm: Normal rate and regular rhythm.     Pulses: Normal pulses.     Heart sounds: Normal heart sounds.  Pulmonary:     Effort: Pulmonary effort is normal. No  respiratory distress.     Breath sounds: No wheezing.  Abdominal:     Palpations: Abdomen is soft.     Tenderness: There is no abdominal tenderness. There is no guarding or rebound.     Comments: No notable tenderness of the abdomen.  No guarding or rebound no bruising  Musculoskeletal:     Cervical back: Normal range of motion.     Right lower leg: No edema.     Left lower leg: No edema.     Comments: No midline C, T, L-spine tenderness.  Skin:    General: Skin is warm and dry.     Capillary Refill: Capillary refill takes less than 2 seconds.  Neurological:     Mental Status: She is alert. Mental status is at baseline.  Psychiatric:        Mood and Affect: Mood normal.        Behavior: Behavior normal.     ED Results / Procedures / Treatments   Labs (all labs  ordered are listed, but only abnormal results are displayed) Labs Reviewed  LIPASE, BLOOD - Abnormal; Notable for the following components:      Result Value   Lipase 56 (*)    All other components within normal limits  COMPREHENSIVE METABOLIC PANEL - Abnormal; Notable for the following components:   Sodium 132 (*)    Chloride 95 (*)    Creatinine, Ser 5.29 (*)    Calcium 8.2 (*)    Alkaline Phosphatase 171 (*)    GFR, Estimated 8 (*)    All other components within normal limits  CBC - Abnormal; Notable for the following components:   RBC 3.61 (*)    Hemoglobin 10.7 (*)    HCT 32.8 (*)    RDW 17.1 (*)    All other components within normal limits    EKG None  Radiology CT ABDOMEN PELVIS WO CONTRAST  Result Date: 07/27/2022 CLINICAL DATA:  Severe right lower quadrant pain. EXAM: CT ABDOMEN AND PELVIS WITHOUT CONTRAST TECHNIQUE: Multidetector CT imaging of the abdomen and pelvis was performed following the standard protocol without IV contrast. RADIATION DOSE REDUCTION: This exam was performed according to the departmental dose-optimization program which includes automated exposure control, adjustment of the mA and/or kV according to patient size and/or use of iterative reconstruction technique. COMPARISON:  02/18/2017 FINDINGS: Lower chest: No acute findings. Hepatobiliary: No mass visualized on this unenhanced exam. Prior cholecystectomy. No evidence of biliary obstruction. Pancreas: No mass or inflammatory process visualized on this unenhanced exam. Spleen:  Within normal limits in size. Adrenals/Urinary tract: Diffuse atrophy of native kidneys is seen, consistent with end-stage renal disease. No evidence of hydronephrosis. Stomach/Bowel: No evidence of obstruction, inflammatory process, or abnormal fluid collections. Diverticulosis is seen mainly involving the descending and sigmoid colon, however there is no evidence of diverticulitis. Although the appendix is not directly visualized,  no inflammatory process seen in region of the cecum or elsewhere. Vascular/Lymphatic: No pathologically enlarged lymph nodes identified. No evidence of abdominal aortic aneurysm. Aortic atherosclerotic calcification incidentally noted. Reproductive: Few small calcified uterine fibroids are again seen largest measuring 1.5 cm. Adnexal regions are unremarkable. Other:  None. Musculoskeletal: No suspicious bone lesions identified. Severe multilevel lumbar degenerative disc disease is stable, as well as grade 1 anterolisthesis at L4-5. IMPRESSION: No acute findings. Diffuse renal atrophy, consistent with end-stage renal disease. Colonic diverticulosis, without radiographic evidence of diverticulitis. Stable small calcified uterine fibroids. Electronically Signed   By: Dietrich Pates.D.  On: 07/27/2022 16:25    Procedures Procedures    Medications Ordered in ED Medications - No data to display  ED Course/ Medical Decision Making/ A&P                             Medical Decision Making Amount and/or Complexity of Data Reviewed Labs: ordered.   This patient presents to the ED for concern of flank pain, this involves a number of treatment options, and is a complaint that carries with it a moderate risk of complications and morbidity. A differential diagnosis was considered for the patient's symptoms which is discussed below:   The differential diagnosis of emergent flank pain includes, but is not limited to :Abdominal aortic aneurysm,, Renal artery embolism,Renal vein thrombosis, Aortic dissection, Mesenteric ischemia, Pyelonephritis, Renal infarction, Renal hemorrhage, Nephrolithiasis/ Renal Colic, Bladder tumor,Cystitis, Biliary colic, Pancreatitis Perforated peptic ulcer Appendicitis ,Inguinal Hernia, Diverticulitis, Bowel obstruction Ectopic Pregnancy,PID/TOA,Ovarian cyst, Ovarian torsion, shingles. 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  This prompted her to leave her dialysis session halfway through her 4-hour session.  She states she may have had some nausea but no episodes of emesis no diarrhea or constipation.  She has not taken any medications for her symptoms today.  She states she does sometimes try to treat her back pain with Tylenol.  She denies any fevers or chest pain no lightheadedness or dizziness.  She states she feels well now her symptoms are completely resolved.    EMR reviewed including pt PMHx, past surgical history and past visits to ER.   See HPI for more details   Lab Tests:   I personally reviewed all laboratory work and imaging. Metabolic panel without any acute abnormality specifically kidney function within normal limits and no significant electrolyte abnormalities. CBC without leukocytosis or significant anemia. Labs are at baseline for patient and consistent with dialysis status.  Imaging Studies:  NAD. I personally reviewed all imaging studies and no acute abnormality found. I agree with radiology interpretation.  IMPRESSION:  No acute findings.    Diffuse renal atrophy, consistent with end-stage renal disease.    Colonic diverticulosis, without radiographic evidence of  diverticulitis.    Stable small calcified uterine fibroids.    Cardiac Monitoring:  NA NA   Medicines ordered:  No pain, no symptoms currently.    Critical  Interventions:     Consults/Attending Physician      Reevaluation:  After the interventions noted above I re-evaluated patient and found that they have :resolved   Social Determinants of Health:      Problem List / ED Course:  Patient transferred to emergency department due to severe abdominal pain.  She has had a workup and imaging done from triage however ultimately her symptoms completely resolved and she is now symptom-free.  Well-appearing on exam normal vital signs apart from blood pressure which is  slightly low but consistent with previous blood pressures from outpatient workup.  She appears euvolemic on exam she will need to follow-up with outpatient team.  No abnormalities seen on CT imaging.  Given that she is symptom-free tolerating p.o. and well-appearing apart from her chronic disease I will go ahead and discharge home with recommendations to follow-up with outpatient team and to return to emergency room should she have any new or concerning symptoms.   Dispostion:  After consideration of the diagnostic results and the patients response to treatment, I feel that the patent would benefit from outpatient follow-up   Final Clinical Impression(s) / ED Diagnoses Final diagnoses:  Lower abdominal pain    Rx / DC Orders ED Discharge Orders     None         Gailen Shelter, [place] 07/27/22 1751    Gailen Shelter, [place] 07/27/22 1811    Jacalyn Lefevre, MD 07/27/22 2218

## 2022-07-27 NOTE — ED Notes (Signed)
Patient is being discharged from the Urgent Care and sent to the Emergency Department via POV . Per Dorann Ou, PA-C, patient is in need of higher level of care due to abdominal pain. Patient is aware and verbalizes understanding of plan of care.  Vitals:   07/27/22 1340  BP: (!) 115/57  Pulse: (!) 57  Resp: 18  Temp: 97.7 F (36.5 C)  SpO2: 99%

## 2022-07-27 NOTE — Discharge Instructions (Signed)
Your workup today was reassuring.  Please follow-up with your primary care doctor, please return to the emergency room for any new or concerning symptoms.  I am glad that your pain has resolved.  I do recommend taking 1000 mg of Tylenol every 6 hours for 2 or 3 doses and keep a close eye on your symptoms.

## 2022-07-28 DIAGNOSIS — N186 End stage renal disease: Secondary | ICD-10-CM | POA: Diagnosis not present

## 2022-07-28 DIAGNOSIS — Z992 Dependence on renal dialysis: Secondary | ICD-10-CM | POA: Diagnosis not present

## 2022-07-28 DIAGNOSIS — N039 Chronic nephritic syndrome with unspecified morphologic changes: Secondary | ICD-10-CM | POA: Diagnosis not present

## 2022-07-30 DIAGNOSIS — D509 Iron deficiency anemia, unspecified: Secondary | ICD-10-CM | POA: Diagnosis not present

## 2022-07-30 DIAGNOSIS — N186 End stage renal disease: Secondary | ICD-10-CM | POA: Diagnosis not present

## 2022-07-30 DIAGNOSIS — Z992 Dependence on renal dialysis: Secondary | ICD-10-CM | POA: Diagnosis not present

## 2022-07-30 DIAGNOSIS — D631 Anemia in chronic kidney disease: Secondary | ICD-10-CM | POA: Diagnosis not present

## 2022-07-30 DIAGNOSIS — N2581 Secondary hyperparathyroidism of renal origin: Secondary | ICD-10-CM | POA: Diagnosis not present

## 2022-08-01 ENCOUNTER — Telehealth: Payer: Self-pay | Admitting: *Deleted

## 2022-08-01 DIAGNOSIS — D509 Iron deficiency anemia, unspecified: Secondary | ICD-10-CM | POA: Diagnosis not present

## 2022-08-01 DIAGNOSIS — N2581 Secondary hyperparathyroidism of renal origin: Secondary | ICD-10-CM | POA: Diagnosis not present

## 2022-08-01 DIAGNOSIS — D631 Anemia in chronic kidney disease: Secondary | ICD-10-CM | POA: Diagnosis not present

## 2022-08-01 DIAGNOSIS — Z992 Dependence on renal dialysis: Secondary | ICD-10-CM | POA: Diagnosis not present

## 2022-08-01 DIAGNOSIS — N186 End stage renal disease: Secondary | ICD-10-CM | POA: Diagnosis not present

## 2022-08-01 NOTE — Telephone Encounter (Signed)
Transition Care Management Follow-up Telephone Call Date of discharge and from where: [facility] ed 07/27/2022 How have you been since you were released from the hospital? Not very good yet  Any questions or concerns? No  Items Reviewed: Did the pt receive and understand the discharge instructions provided? Yes  Medications obtained and verified? No  Other? No  Any new allergies since your discharge? No  Dietary orders reviewed? No Do you have support at home? Yes   Follow up appointments reviewed:  PCP Hospital f/u appt confirmed? Yes  patient stated she will see pcp next week  Are transportation arrangements needed? No  If their condition worsens, is the pt aware to call PCP or go to the Emergency Dept.? Yes Was the patient provided with contact information for the PCP's office or ED? Yes Was to pt encouraged to call back with questions or concerns? Yes

## 2022-08-03 ENCOUNTER — Other Ambulatory Visit: Payer: Self-pay

## 2022-08-03 ENCOUNTER — Emergency Department (HOSPITAL_COMMUNITY)
Admission: EM | Admit: 2022-08-03 | Discharge: 2022-08-03 | Disposition: A | Discharge status: Home / Self Care | Payer: Medicare Other | Attending: Emergency Medicine | Admitting: Emergency Medicine

## 2022-08-03 DIAGNOSIS — Z7902 Long term (current) use of antithrombotics/antiplatelets: Secondary | ICD-10-CM | POA: Diagnosis not present

## 2022-08-03 DIAGNOSIS — N186 End stage renal disease: Secondary | ICD-10-CM | POA: Diagnosis not present

## 2022-08-03 DIAGNOSIS — M546 Pain in thoracic spine: Secondary | ICD-10-CM | POA: Diagnosis not present

## 2022-08-03 DIAGNOSIS — Z992 Dependence on renal dialysis: Secondary | ICD-10-CM | POA: Diagnosis not present

## 2022-08-03 DIAGNOSIS — M545 Low back pain, unspecified: Secondary | ICD-10-CM | POA: Diagnosis not present

## 2022-08-03 DIAGNOSIS — D631 Anemia in chronic kidney disease: Secondary | ICD-10-CM | POA: Diagnosis not present

## 2022-08-03 DIAGNOSIS — N2581 Secondary hyperparathyroidism of renal origin: Secondary | ICD-10-CM | POA: Diagnosis not present

## 2022-08-03 DIAGNOSIS — D509 Iron deficiency anemia, unspecified: Secondary | ICD-10-CM | POA: Diagnosis not present

## 2022-08-03 DIAGNOSIS — G8929 Other chronic pain: Secondary | ICD-10-CM

## 2022-08-03 LAB — COMPREHENSIVE METABOLIC PANEL
ALT: 18 U/L (ref 0–44)
AST: 32 U/L (ref 15–41)
Albumin: 4.1 g/dL (ref 3.5–5.0)
Alkaline Phosphatase: 168 U/L — ABNORMAL HIGH (ref 38–126)
Anion gap: 18 — ABNORMAL HIGH (ref 5–15)
BUN: 37 mg/dL — ABNORMAL HIGH (ref 8–23)
CO2: 20 mmol/L — ABNORMAL LOW (ref 22–32)
Calcium: 9.7 mg/dL (ref 8.9–10.3)
Chloride: 99 mmol/L (ref 98–111)
Creatinine, Ser: 10.21 mg/dL — ABNORMAL HIGH (ref 0.44–1.00)
GFR, Estimated: 4 mL/min — ABNORMAL LOW (ref >60)
Glucose, Bld: 103 mg/dL — ABNORMAL HIGH (ref 70–99)
Potassium: 4.8 mmol/L (ref 3.5–5.1)
Sodium: 137 mmol/L (ref 135–145)
Total Bilirubin: 0.6 mg/dL (ref 0.3–1.2)
Total Protein: 7.7 g/dL (ref 6.5–8.1)

## 2022-08-03 LAB — CBC
HCT: 35.1 % — ABNORMAL LOW (ref 36.0–46.0)
Hemoglobin: 11.4 g/dL — ABNORMAL LOW (ref 12.0–15.0)
MCH: 29.6 pg (ref 26.0–34.0)
MCHC: 32.5 g/dL (ref 30.0–36.0)
MCV: 91.2 fL (ref 80.0–100.0)
Platelets: 188 10*3/uL (ref 150–400)
RBC: 3.85 MIL/uL — ABNORMAL LOW (ref 3.87–5.11)
RDW: 17.2 % — ABNORMAL HIGH (ref 11.5–15.5)
WBC: 5.5 10*3/uL (ref 4.0–10.5)
nRBC: 0 % (ref 0.0–0.2)

## 2022-08-03 LAB — LIPASE, BLOOD: Lipase: 53 U/L — ABNORMAL HIGH (ref 11–51)

## 2022-08-03 MED ORDER — TRAMADOL HCL 50 MG PO TABS: 50.0000 mg | ORAL_TABLET | Freq: Four times a day (QID) | ORAL | 0 refills | Status: DC | PRN

## 2022-08-03 MED ORDER — TRAMADOL HCL 50 MG PO TABS
50.0000 mg | ORAL_TABLET | Freq: Once | ORAL | Status: AC
  Administered 2022-08-03: 50 mg via ORAL
  Filled 2022-08-03: qty 1

## 2022-08-03 NOTE — ED Provider Notes (Signed)
[place] EMERGENCY DEPARTMENT AT [facility] Provider Note   CSN: [id number] Arrival date & time: 08/03/22  0450     History  Chief Complaint  Patient presents with   Back Pain    [name] Marks is a 67 y.o. female.   Back Pain  This patient is a 71 old female, end-stage renal disease on dialysis Monday Wednesday Friday, she was supposed to go today but came here instead because of back pain, she has already called the dialysis center to let them know and to reschedule.  She reports that over the last few years she has had some recurrent intermittent pain in her right mid back, seems to come on intermittently and gets worse when she exerts herself.  She states that yesterday she was doing laundry and with the bending over and lifting the laundry basket she started to have the pain again.  There is no radiation to the abdomen nor is there any chest pain or shortness of breath, no nausea or vomiting, no weakness, the pain is worse when she tries to bend over or twist.  She had a CT scan for similar symptoms within the last couple of weeks, it was unremarkable.  The patient is an uric    Home Medications Prior to Admission medications   Medication Sig Start Date End Date Taking? Authorizing Provider  traMADol (ULTRAM) 50 MG tablet Take 1 tablet (50 mg total) by mouth every 6 (six) hours as needed. 08/03/22  Yes Eber Hong, MD  cinacalcet (SENSIPAR) 90 MG tablet Take 90 mg by mouth at bedtime.    [provider]  clopidogrel (PLAVIX) 75 MG tablet Take 1 tablet (75 mg total) by mouth daily. 05/15/22   Sharlene Dory, PA-C  famotidine (PEPCID) 20 MG tablet Take 20 mg by mouth daily after supper.    [provider]  gabapentin (NEURONTIN) 100 MG capsule Take by mouth. 05/30/22   [provider]  midodrine (PROAMATINE) 10 MG tablet Take 10 mg by mouth See admin instructions. Take 1 tablet (10 mg) by mouth before dialysis & take 1 tablet (10 mg) while at  dialysis on Mondays, Wednesdays & Fridays. 08/15/20   [provider]  Multiple Vitamins-Minerals (PRORENAL QD PO) Take 1 tablet by mouth at bedtime.     [provider]  sevelamer carbonate (RENVELA) 800 MG tablet Take 2,400 mg by mouth See admin instructions. Take 3 tablets (2400 mg) by mouth after each meal and after each snack    [provider]      Allergies    Iodinated contrast media, Cefazolin, and Naproxen sodium    Review of Systems   Review of Systems  Musculoskeletal:  Positive for back pain.  All other systems reviewed and are negative.   Physical Exam Updated Vital Signs BP (!) 117/52 (BP Location: Right Arm)   Pulse 71   Temp 97.7 F (36.5 C)   Resp 16   Wt 68 kg   SpO2 96%   BMI 30.30 kg/m  Physical Exam Vitals and nursing note reviewed.  Constitutional:      General: She is not in acute distress.    Appearance: She is well-developed.  HENT:     Head: Normocephalic and atraumatic.     Mouth/Throat:     Pharynx: No oropharyngeal exudate.  Eyes:     General: No scleral icterus.       Right eye: No discharge.  Left eye: No discharge.     Conjunctiva/sclera: Conjunctivae normal.     Pupils: Pupils are equal, round, and reactive to light.  Neck:     Thyroid: No thyromegaly.     Vascular: No JVD.  Cardiovascular:     Rate and Rhythm: Normal rate and regular rhythm.     Heart sounds: Normal heart sounds. No murmur heard.    No friction rub. No gallop.  Pulmonary:     Effort: Pulmonary effort is normal. No respiratory distress.     Breath sounds: Normal breath sounds. No wheezing or rales.  Abdominal:     General: Bowel sounds are normal. There is no distension.     Palpations: Abdomen is soft. There is no mass.     Tenderness: There is no abdominal tenderness.  Musculoskeletal:        General: Tenderness (Tender to palpation in the right mid back, no spinal tenderness, no lumbar tenderness, no edema of the legs) present.  Normal range of motion.     Cervical back: Normal range of motion and neck supple.     Right lower leg: No edema.     Left lower leg: No edema.  Lymphadenopathy:     Cervical: No cervical adenopathy.  Skin:    General: Skin is warm and dry.     Findings: No erythema or rash.     Comments: There is no rash over the area of the back that hurts  Neurological:     Mental Status: She is alert.     Coordination: Coordination normal.  Psychiatric:        Behavior: Behavior normal.     ED Results / Procedures / Treatments   Labs (all labs ordered are listed, but only abnormal results are displayed) Labs Reviewed  LIPASE, BLOOD - Abnormal; Notable for the following components:      Result Value   Lipase 53 (*)    All other components within normal limits  COMPREHENSIVE METABOLIC PANEL - Abnormal; Notable for the following components:   CO2 20 (*)    Glucose, Bld 103 (*)    BUN 37 (*)    Creatinine, Ser 10.21 (*)    Alkaline Phosphatase 168 (*)    GFR, Estimated 4 (*)    Anion gap 18 (*)    All other components within normal limits  CBC - Abnormal; Notable for the following components:   RBC 3.85 (*)    Hemoglobin 11.4 (*)    HCT 35.1 (*)    RDW 17.2 (*)    All other components within normal limits    EKG None  Radiology No results found.  Procedures Procedures    Medications Ordered in ED Medications  traMADol (ULTRAM) tablet 50 mg (has no administration in time range)    ED Course/ Medical Decision Making/ A&P                             Medical Decision Making Amount and/or Complexity of Data Reviewed Labs: ordered.  Risk Prescription drug management.    This patient presents to the ED for concern of back pain differential diagnosis includes musculoskeletal seems most likely, would also consider kidney stone but had recent CT showing that that was not it, she is an uric, unlikely to be pyelonephritis, she is afebrile, she has no neurologic symptoms,  doubt spinal pathology.  This is a pain that recurs over years and seems to  be very muscular in location and reproducible and tenderness    Additional history obtained:  Additional history obtained from medical record External records from outside source obtained and reviewed including prior CT scan showing no signs of acute pathology   Lab Tests:  I Ordered, and personally interpreted labs.  The pertinent results include: Renal failure as previously diagnosed, she is not hyperkalemic   Imaging Studies ordered:  Reviewed CT scan done previous month   Medicines ordered and prescription drug management:  I ordered medication including tramadol for pain, the patient states this usually helps Reevaluation of the patient after these medicines showed that the patient improved I have reviewed the patients home medicines and have made adjustments as needed   Problem List / ED Course:  Back pain, likely musculoskeletal, can resume dialysis, no indication for admission or further aggressive testing   Social Determinants of Health:  I have discussed with the patient at the bedside the results, and the meaning of these results.  They have expressed her understanding to the need for follow-up with primary care physician           Final Clinical Impression(s) / ED Diagnoses Final diagnoses:  Chronic right-sided back pain, unspecified back location    Rx / DC Orders ED Discharge Orders          Ordered    traMADol (ULTRAM) 50 MG tablet  Every 6 hours PRN        08/03/22 0808              Eber Hong, MD 08/03/22 [phone]

## 2022-08-03 NOTE — Discharge Instructions (Signed)
You may take the tramadol 1 tablet every 6 hours as needed only for severe pain, see your family doctor if no better within 48 hours, I would also recommend warm compresses, please call your dialysis center to rearrange her dialysis for either later today or tomorrow.  Thank you for allowing [place] to treat you in the emergency department today.  After reviewing your examination and potential testing that was done it appears that you are safe to go home.  I would like for you to follow-up with your doctor within the next several days, have them obtain your results and follow-up with them to review all of these tests.  If you should develop severe or worsening symptoms return to the emergency department immediately

## 2022-08-03 NOTE — ED Triage Notes (Addendum)
Pt arrives from home via pov, presents with lower back and R side pain. Endorses nausea w/o vomiting. Last bm today. Pt states she left last hemodialysis early for 2wks d/t pain. Receives dialysis MWF. Anuric.   Seen previously for similar symptoms, CT preformed.

## 2022-08-06 DIAGNOSIS — N186 End stage renal disease: Secondary | ICD-10-CM | POA: Diagnosis not present

## 2022-08-06 DIAGNOSIS — D509 Iron deficiency anemia, unspecified: Secondary | ICD-10-CM | POA: Diagnosis not present

## 2022-08-06 DIAGNOSIS — N2581 Secondary hyperparathyroidism of renal origin: Secondary | ICD-10-CM | POA: Diagnosis not present

## 2022-08-06 DIAGNOSIS — Z992 Dependence on renal dialysis: Secondary | ICD-10-CM | POA: Diagnosis not present

## 2022-08-06 DIAGNOSIS — D631 Anemia in chronic kidney disease: Secondary | ICD-10-CM | POA: Diagnosis not present

## 2022-08-08 ENCOUNTER — Telehealth: Payer: Self-pay | Admitting: *Deleted

## 2022-08-08 DIAGNOSIS — N2581 Secondary hyperparathyroidism of renal origin: Secondary | ICD-10-CM | POA: Diagnosis not present

## 2022-08-08 DIAGNOSIS — Z992 Dependence on renal dialysis: Secondary | ICD-10-CM | POA: Diagnosis not present

## 2022-08-08 DIAGNOSIS — D509 Iron deficiency anemia, unspecified: Secondary | ICD-10-CM | POA: Diagnosis not present

## 2022-08-08 DIAGNOSIS — N186 End stage renal disease: Secondary | ICD-10-CM | POA: Diagnosis not present

## 2022-08-08 DIAGNOSIS — D631 Anemia in chronic kidney disease: Secondary | ICD-10-CM | POA: Diagnosis not present

## 2022-08-08 NOTE — Telephone Encounter (Signed)
Transition Care Management Unsuccessful Follow-up Telephone Call  Date of discharge and from where:  [facility] 08/02/2022  Attempts:  1st Attempt  Reason for unsuccessful TCM follow-up call:  Left voice message

## 2022-08-10 DIAGNOSIS — N2581 Secondary hyperparathyroidism of renal origin: Secondary | ICD-10-CM | POA: Diagnosis not present

## 2022-08-10 DIAGNOSIS — D509 Iron deficiency anemia, unspecified: Secondary | ICD-10-CM | POA: Diagnosis not present

## 2022-08-10 DIAGNOSIS — Z992 Dependence on renal dialysis: Secondary | ICD-10-CM | POA: Diagnosis not present

## 2022-08-10 DIAGNOSIS — N186 End stage renal disease: Secondary | ICD-10-CM | POA: Diagnosis not present

## 2022-08-10 DIAGNOSIS — D631 Anemia in chronic kidney disease: Secondary | ICD-10-CM | POA: Diagnosis not present

## 2022-08-14 ENCOUNTER — Encounter: Payer: Self-pay | Admitting: Physician Assistant

## 2022-08-14 ENCOUNTER — Ambulatory Visit: Payer: Medicare Other | Attending: Physician Assistant | Admitting: Physician Assistant

## 2022-08-14 VITALS — BP 136/82 | HR 95 | Ht 59.0 in | Wt 150.4 lb

## 2022-08-14 DIAGNOSIS — Z8679 Personal history of other diseases of the circulatory system: Secondary | ICD-10-CM | POA: Diagnosis not present

## 2022-08-14 DIAGNOSIS — Z952 Presence of prosthetic heart valve: Secondary | ICD-10-CM | POA: Insufficient documentation

## 2022-08-14 DIAGNOSIS — I359 Nonrheumatic aortic valve disorder, unspecified: Secondary | ICD-10-CM | POA: Diagnosis not present

## 2022-08-14 DIAGNOSIS — Z992 Dependence on renal dialysis: Secondary | ICD-10-CM | POA: Insufficient documentation

## 2022-08-14 DIAGNOSIS — G8929 Other chronic pain: Secondary | ICD-10-CM | POA: Insufficient documentation

## 2022-08-14 DIAGNOSIS — M549 Dorsalgia, unspecified: Secondary | ICD-10-CM | POA: Diagnosis not present

## 2022-08-14 DIAGNOSIS — D631 Anemia in chronic kidney disease: Secondary | ICD-10-CM | POA: Diagnosis not present

## 2022-08-14 DIAGNOSIS — I071 Rheumatic tricuspid insufficiency: Secondary | ICD-10-CM | POA: Diagnosis not present

## 2022-08-14 DIAGNOSIS — N186 End stage renal disease: Secondary | ICD-10-CM | POA: Diagnosis not present

## 2022-08-14 DIAGNOSIS — D509 Iron deficiency anemia, unspecified: Secondary | ICD-10-CM | POA: Diagnosis not present

## 2022-08-14 DIAGNOSIS — N2581 Secondary hyperparathyroidism of renal origin: Secondary | ICD-10-CM | POA: Diagnosis not present

## 2022-08-14 NOTE — Progress Notes (Addendum)
Office Visit    Patient Name: [name] Marks Date of Encounter: 08/14/2022  PCP:  Program, [facility]   [facility]  Cardiologist:  Armanda Magic, MD  Advanced Practice Provider:  No care team member to display Electrophysiologist:  None   HPI    [name] Marks is a 67 y.o. female with past medical history significant for endocarditis, severe AI status post bioprosthetic AVR, ESRD on HD, TIA, moderate TR presents today for follow-up appointment.  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  This was complicated by severe aortic insufficiency.  Underwent cardiac catheterization showing normal coronary arteries and then underwent bioprosthetic AVR.  Unfortunately, she subsequently had a TIA March 2018.  Status post ILR implantation by Dr. Elberta Fortis 09/2016.  Last echo 08/2019 with LVEF of 65 to 70%, grade 1 DD, normal RSVP at 28.4 mmHg, severe TR, normal functioning aortic valve.  Stress test 10/04/2020 with no clear evidence of significant ischemia or scar.  Patient was last seen 11/02/2021 and at that time she was undergoing kidney transplant workup at [facility].  Seen by cardiologist at [facility] as part of the workup.  Underwent echocardiogram 7/23 showing LV function greater than 55%, status post bioprosthetic AVR with normal function, mildly dilated RV with normal function.  Severe TR.  Now requiring to have her valve fixed prior to kidney transplant.  Follow-up with the surgeon was scheduled at that time.  Patient denies shortness of breath, orthopnea, PND, syncope, lower extremity edema or melena.  Does not make urine and has dialysis M, W, F.  She was seen by me March 2024, she states that she sometimes has some chest pains but they are not regular and they are nonexertional.  No shortness of breath or swelling in her legs.  She states she lost about 27 pounds over the past couple of months.  She was sick  back in December and has lessened her portion sizes.  She is eating but just less.  She was diagnosed with severe TR back in 2021 via echocardiogram.  There has been mention that she would need a surgical repair prior to getting her kidney transplant.  Per [facility] notes back in September her TR is stable enough to go forward with transplant surgery however, would require treadmill stress test to be done through them prior to transplant surgery.  Her blood pressure is low normal today.  She is asymptomatic.  Echocardiogram performed at [facility] last summer reviewed with the patient today.  Today, she tells me that she recently had an ED visit 6/7 she was having back issues. Osteo in her back, disc missing in her back which chronic pain. Not an everyday thing. It started in 2010, couldn't get on the dialysis bed. She was given tramadol by the ER but her dialysis doctor will not prescribe it for her.  She is very upset about this.  She went to [facility] and they did a treadmill test-for the kidney transplant.  She tells me that her exercise stress test was negative for any ischemia.  We discussed updating an echocardiogram today for her severe TR. we discussed that it might be best for her to see a chronic pain specialist for her back pain.  Blood pressure is well-controlled.  Reports no shortness of breath nor dyspnea on exertion. Reports no chest pain, pressure, or tightness. No edema, orthopnea, PND. Reports no palpitations.  Past Medical History  Past Medical History:  Diagnosis Date   Anemia    Aortic valve prosthesis present 02/25/2016   Arthritis    "knees" (01/03/2017)   AVD (aortic valve disease) 07/12/2016   Backache 12/06/2008   Bacteremia due to coagulase-negative Staphylococcus    Carpal tunnel syndrome    Cerebral embolism with transient ischemic attack (TIA)    Cholelithiases 01/28/2017   Chronic female pelvic pain 08/08/2012   Complication of anesthesia    01/01/17- '"a long  time ago, difficulty breathimg, not sure if it was due to anesthesia or not."   CVA (cerebral vascular accident) (HCC) 05/16/2016   End stage renal disease on dialysis (HCC)    "MWF; [address] (01/03/2017)   Endocarditis    Esophageal reflux 12/06/2008   GERD (gastroesophageal reflux disease)    Gout    History of blood transfusion 2017   "related to blood poison" (01/03/2017)   Increased endometrial stripe thickness 11/03/2015   Neck pain    Osteoporosis 09/2016   T score -2.6   Pain and swelling of right upper extremity 12/20/2015   Pulmonary HTN (HCC) 07/12/2016   Renal dialysis device, implant, or graft complication 12/20/2015   Renal insufficiency    S/P cholecystectomy 02/14/2017   Septic shock (HCC)    Staphylococcus aureus bacteremia    TIA (transient ischemic attack)    "several" (01/03/2017)   Past Surgical History:  Procedure Laterality Date   AORTIC VALVE REPLACEMENT N/A 02/25/2016   Procedure: AORTIC VALVE REPLACEMENT (AVR) implanted with Magna Ease Aortic valve size 21mm;  Surgeon: Loreli Slot, MD;  Location: [facility] OR;  Service: Open Heart Surgery;  Laterality: N/A;   AV FISTULA PLACEMENT Left 01/03/2017   Procedure: INSERTION OF ARTERIOVENOUS (AV) GORE-TEX GRAFT LEFT THIGH;  Surgeon: Nada Libman, MD;  Location: [facility] OR;  Service: Vascular;  Laterality: Left;   AVGG REMOVAL Right 02/17/2016   Procedure: REMOVAL OF TWO ARTERIOVENOUS GORETEX GRAFTS (AVGG);  Surgeon: Chuck Hint, MD;  Location: [facility] OR;  Service: Vascular;  Laterality: Right;   BIOPSY  05/16/2021   Procedure: BIOPSY;  Surgeon: Charlott Rakes, MD;  Location: [facility] ENDOSCOPY;  Service: Endoscopy;;   BREAST BIOPSY Left 2013   stereo    BREAST BIOPSY Right 2011   stereo    CARDIAC VALVE REPLACEMENT     CHOLECYSTECTOMY N/A 01/30/2017   Procedure: LAPAROSCOPIC CHOLECYSTECTOMY;  Surgeon: Harriette Bouillon, MD;  Location: [facility] OR;  Service: General;  Laterality: N/A;   COLONOSCOPY W/ POLYPECTOMY      COLONOSCOPY WITH PROPOFOL N/A 05/16/2021   Procedure: COLONOSCOPY WITH PROPOFOL;  Surgeon: Charlott Rakes, MD;  Location: [facility] ENDOSCOPY;  Service: Endoscopy;  Laterality: N/A;   DG AV DIALYSIS GRAFT DECLOT OR     DILATATION & CURETTAGE/HYSTEROSCOPY WITH TRUECLEAR N/A 11/06/2012   Procedure: DILATATION & CURETTAGE/HYSTEROSCOPY WITH TRUECLEAR;  Surgeon: Ok Edwards, MD;  Location: [facility] ORS;  Service: Gynecology;  Laterality: N/A;  Truclear Resectoscopic Polypectomy    INSERTION OF DIALYSIS CATHETER N/A 02/19/2016   Procedure: INSERTION OF Left Internal Jugular DIALYSIS CATHETER;  Surgeon: Chuck Hint, MD;  Location: [facility] OR;  Service: Vascular;  Laterality: N/A;   LOOP RECORDER INSERTION N/A 10/01/2016   Procedure: LOOP RECORDER INSERTION;  Surgeon: Regan Lemming, MD;  Location: [facility] INVASIVE CV LAB;  Service: Cardiovascular;  Laterality: N/A;   PATCH ANGIOPLASTY Right 02/17/2016   Procedure: PATCH ANGIOPLASTY;  Surgeon: Chuck Hint, MD;  Location: [facility] OR;  Service: Vascular;  Laterality: Right;  PERIPHERAL VASCULAR CATHETERIZATION N/A 09/14/2014   Procedure: A/V Shuntogram/Fistulagram;  Surgeon: Renford Dills, MD;  Location: [facility] INVASIVE CV LAB;  Service: Cardiovascular;  Laterality: N/A;   PERIPHERAL VASCULAR CATHETERIZATION N/A 09/14/2014   Procedure: A/V Shunt Intervention;  Surgeon: Renford Dills, MD;  Location: [facility] INVASIVE CV LAB;  Service: Cardiovascular;  Laterality: N/A;   PERIPHERAL VASCULAR CATHETERIZATION Right 12/09/2014   Procedure: A/V Shuntogram/Fistulagram;  Surgeon: Annice Needy, MD;  Location: [facility] INVASIVE CV LAB;  Service: Cardiovascular;  Laterality: Right;   PERIPHERAL VASCULAR CATHETERIZATION N/A 12/09/2014   Procedure: A/V Shunt Intervention;  Surgeon: Annice Needy, MD;  Location: [facility] INVASIVE CV LAB;  Service: Cardiovascular;  Laterality: N/A;   PERIPHERAL VASCULAR CATHETERIZATION Right 05/24/2015   Procedure: A/V Shuntogram;  Surgeon:  Nada Libman, MD;  Location: [facility] INVASIVE CV LAB;  Service: Cardiovascular;  Laterality: Right;   PERIPHERAL VASCULAR CATHETERIZATION Right 05/24/2015   Procedure: Peripheral Vascular Balloon Angioplasty;  Surgeon: Nada Libman, MD;  Location: [facility] INVASIVE CV LAB;  Service: Cardiovascular;  Laterality: Right;  right arm shunt   PERIPHERAL VASCULAR CATHETERIZATION N/A 06/13/2015   Procedure: A/V Shuntogram/Fistulagram;  Surgeon: Annice Needy, MD;  Location: [facility] INVASIVE CV LAB;  Service: Cardiovascular;  Laterality: N/A;   PERIPHERAL VASCULAR CATHETERIZATION N/A 06/13/2015   Procedure: A/V Shunt Intervention;  Surgeon: Annice Needy, MD;  Location: [facility] INVASIVE CV LAB;  Service: Cardiovascular;  Laterality: N/A;   TEE WITHOUT CARDIOVERSION N/A 02/22/2016   Procedure: TRANSESOPHAGEAL ECHOCARDIOGRAM (TEE);  Surgeon: Chrystie Nose, MD;  Location: [facility] ENDOSCOPY;  Service: Cardiovascular;  Laterality: N/A;   TEE WITHOUT CARDIOVERSION N/A 02/25/2016   Procedure: TRANSESOPHAGEAL ECHOCARDIOGRAM (TEE);  Surgeon: Loreli Slot, MD;  Location: [facility] OR;  Service: Open Heart Surgery;  Laterality: N/A;   TEE WITHOUT CARDIOVERSION N/A 10/01/2016   Procedure: TRANSESOPHAGEAL ECHOCARDIOGRAM (TEE);  Surgeon: Chrystie Nose, MD;  Location: [facility] ENDOSCOPY;  Service: Cardiovascular;  Laterality: N/A;   TUBAL LIGATION  1983    Allergies  Allergies  Allergen Reactions   Iodinated Contrast Media Anaphylaxis and Hives   Cefazolin Nausea And Vomiting   Naproxen Sodium Itching    EKGs/Labs/Other Studies Reviewed:   The following studies were reviewed today: Echo 08/2021 @ [facility] Summary    1. The left ventricle is normal in size with normal wall thickness.    2. The left ventricular systolic function is normal, LVEF is visually  estimated at > 55%.    3. Aortic valve replacement (bioprosthetic, implantation date:  2017).    4. Aortic valve Doppler indices are consistent with normal prosthetic valve  function.     5. The left atrium is mildly dilated in size.    6. The right ventricle is mildly dilated in size, with normal systolic  function.    7. There is severe tricuspid regurgitation.    8. The right atrium is mildly dilated  in size.    9. IVC size and inspiratory change suggest elevated right atrial pressure.  (10-20 mmHg).    Left Ventricle    The left ventricle is normal in size with normal wall thickness.    The left ventricular systolic function is normal, LVEF is visually estimated  at > 55%.    There is normal left ventricular diastolic function.   Right Ventricle    The right ventricle is mildly dilated in size, with normal systolic  function.    Left Atrium    The left atrium is  mildly dilated in size.   Right Atrium    The right atrium is mildly dilated  in size.    Aortic Valve    The aortic valve is trileaflet with normal appearing leaflets with normal  excursion.    There is no significant aortic regurgitation.    There is no evidence of a significant transvalvular gradient.    Aortic valve replacement (bioprosthetic, implantation date: 2017).    Mean gradient: 9 mmHg.    The prosthetic aortic valve is well seated.    The prosthetic aortic valve leaflets are thin and pliable.    There is no regurgitation of the prosthetic aortic valve.    Aortic valve Doppler indices are consistent with normal prosthetic valve  function.    AV estimated effective orifice area (by continuity equation):  1.5 cm2.   Pulmonic Valve    The pulmonic valve is normal.    There is no significant pulmonic regurgitation.    There is no evidence of a significant transvalvular gradient.   Mitral Valve    The mitral valve leaflets are normal with normal leaflet mobility.    There is no significant mitral valve regurgitation.   Tricuspid Valve    The tricuspid valve leaflets are normal, with normal leaflet mobility.    There is severe tricuspid regurgitation.    The pulmonary  systolic pressure cannot be estimated due to severe TR.    Pericardium/Pleural    There is no pericardial effusion.   Inferior Vena Cava    IVC size and inspiratory change suggest elevated right atrial pressure.  (10-20 mmHg).   Aorta    The aorta is normal in size in the visualized segments.    Left Ventricular Outflow Tract  ----------------------------------------------------------------------  Name                                 Value        Normal  ----------------------------------------------------------------------   LVOT 2D  ----------------------------------------------------------------------  LVOT Diameter                       1.6 cm                LVOT Area                          2.0 cm2                 LVOT Doppler  ----------------------------------------------------------------------  LVOT Peak Velocity                 1.4 m/s                LVOT VTI                             29 cm                LVOT Stroke Volume                   58 ml   Pulmonic Valve  ----------------------------------------------------------------------  Name                                 Value        Normal  ----------------------------------------------------------------------  PV Doppler  ----------------------------------------------------------------------  PV Peak Velocity                   0.8 m/s   Mitral Valve  ----------------------------------------------------------------------  Name                                 Value        Normal  ----------------------------------------------------------------------   MV Diastolic Function  ----------------------------------------------------------------------  MV E Peak Velocity                 74 cm/s                MV A Peak Velocity                 66 cm/s                MV E/A                                 1.1                 MV Annular TDI  ----------------------------------------------------------------------   MV Septal e' Velocity             8.4 cm/s         >=8.0  MV Lateral e' Velocity           12.9 cm/s        >=10.0  MV e' Average                         10.6                MV E/e' (Average)                      7.3   Tricuspid Valve  ----------------------------------------------------------------------  Name                                 Value        Normal  ----------------------------------------------------------------------   TV Regurgitation Doppler  ----------------------------------------------------------------------  TR Peak Velocity                   2.6 m/s                 Estimated PAP/RSVP  ----------------------------------------------------------------------  RA Pressure                        15 mmHg           <=5  RV Systolic Pressure               41 mmHg           <36   Aorta  ----------------------------------------------------------------------  Name                                 Value        Normal  ----------------------------------------------------------------------   Ascending Aorta  ----------------------------------------------------------------------  Ao Root Diameter (2D)               2.0 cm  Ao Root Diam Index (2D)         11.0 cm/m2   Venous  ----------------------------------------------------------------------  Name                                 Value        Normal  ----------------------------------------------------------------------   IVC/SVC  ----------------------------------------------------------------------  IVC Diameter (Exp 2D)               2.1 cm         <=2.1   Aortic Valve  ----------------------------------------------------------------------  Name                                 Value        Normal  ----------------------------------------------------------------------   AV Doppler  ----------------------------------------------------------------------  AV Mean Gradient                    9 mmHg                 AV VTI                               38 cm                AV Area (Cont Eq VTI)              1.5 cm2         >=3.0  AV Area Index (Cont Eq VTI)     0.8 cm2/m2   Ventricles  ----------------------------------------------------------------------  Name                                 Value        Normal  ----------------------------------------------------------------------   LV Dimensions 2D/MM  ----------------------------------------------------------------------  IVS Diastolic Thickness (2D)        0.6 cm       0.6-0.9  LVID Diastole (2D)                  3.9 cm       3.8-5.2  LVPW Diastolic Thickness  (2D)                                0.6 cm       0.6-0.9  LVID Systole (2D)                   2.4 cm       2.2-3.5  LVOT Diameter                       1.6 cm                 RV Dimensions 2D/MM  ----------------------------------------------------------------------  RV Basal Diastolic Dimension        4.5 cm       2.5-4.1  TAPSE                               1.3 cm         >=1.7   Atria  ----------------------------------------------------------------------  Name  Value        Normal  ----------------------------------------------------------------------   LA Dimensions  ----------------------------------------------------------------------  LA Dimension (2D)                   3.8 cm       2.7-3.8   RA Dimensions  ----------------------------------------------------------------------  RA Area (4C)                      11.7 cm2        <=18.0  RA Area (4C) Index              6.4 cm2/m2      NM stress 10/04/20 - No clear evidence of significant ischemia or scar. - There is a small in size, mild in severity, partially reversible defect involving apical anterior and apical lateral segments, likely an artifact but cannot rule out mild ischemia.  - Left ventricular systolic function is hyperdynamic. Post stress the ejection fraction is  calculated at 90%. The left ventricle is relatively small. - Mitral annular calcifications are noted - Central venous catheter extending from the left innominate vein with tip at the SVC/RA junction. - Bioprosthetic aortic valve noted in the aortic position.   Echo 08/2019  1. Left ventricular ejection fraction, by estimation, is 65 to 70%. The  left ventricle has normal function. The left ventricle has no regional  wall motion abnormalities. Left ventricular diastolic parameters are  consistent with Grade I diastolic  dysfunction (impaired relaxation).   2. Right ventricular systolic function is normal. The right ventricular  size is mildly enlarged. There is normal pulmonary artery systolic  pressure. The estimated right ventricular systolic pressure is 28.4 mmHg.   3. Left atrial size was mildly dilated.   4. Right atrial size was mildly dilated.   5. The mitral valve is normal in structure. Mild mitral valve  regurgitation. No evidence of mitral stenosis.   6. Tricuspid valve regurgitation is severe.   7. The aortic valve has been repaired/replaced. Aortic valve  regurgitation is not visualized. No aortic stenosis is present. There is a  21 mm Magna Ease valve present in the aortic position. Procedure Date:  02/25/2016. Aortic valve mean gradient  measures 9.0 mmHg.   8. The inferior vena cava is normal in size with greater than 50%  respiratory variability, suggesting right atrial pressure of 3 mmHg.     EKG:  EKG is not ordered today.   Recent Labs: 08/03/2022: ALT 18; BUN 37; Creatinine, Ser 10.21; Hemoglobin 11.4; Platelets 188; Potassium 4.8; Sodium 137  Recent Lipid Panel    Component Value Date/Time   CHOL 147 09/29/2016 0531   TRIG 83 09/29/2016 0531   HDL 66 09/29/2016 0531   CHOLHDL 2.2 09/29/2016 0531   VLDL 17 09/29/2016 0531   LDLCALC 64 09/29/2016 0531    Home Medications   Current Meds  Medication Sig   cinacalcet (SENSIPAR) 90 MG tablet Take 90 mg by  mouth at bedtime.   clopidogrel (PLAVIX) 75 MG tablet Take 1 tablet (75 mg total) by mouth daily.   famotidine (PEPCID) 20 MG tablet Take 20 mg by mouth daily after supper.   midodrine (PROAMATINE) 10 MG tablet Take 10 mg by mouth See admin instructions. Take 1 tablet (10 mg) by mouth before dialysis & take 1 tablet (10 mg) while at dialysis on Mondays, Wednesdays & Fridays.   Multiple Vitamins-Minerals (PRORENAL QD PO) Take 1 tablet by mouth at bedtime.  sevelamer carbonate (RENVELA) 800 MG tablet Take 2,400 mg by mouth See admin instructions. Take 3 tablets (2400 mg) by mouth after each meal and after each snack   traMADol (ULTRAM) 50 MG tablet Take 1 tablet (50 mg total) by mouth every 6 (six) hours as needed.     Review of Systems      All other systems reviewed and are otherwise negative except as noted above.  Physical Exam    VS:  BP 136/82   Pulse 95   Ht 4\' 11"  (1.499 m)   Wt 150 lb 6.4 oz (68.2 kg)   SpO2 98%   BMI 30.38 kg/m  , BMI Body mass index is 30.38 kg/m.  *HR did come down to the 60s  Wt Readings from Last 3 Encounters:  08/14/22 150 lb 6.4 oz (68.2 kg)  08/03/22 150 lb (68 kg)  07/27/22 150 lb (68 kg)     GEN: Well nourished, well developed, in no acute distress. HEENT: normal. Neck: Supple, no JVD, carotid bruits, or masses. Cardiac: RRR, no murmurs, rubs, or gallops. No clubbing, cyanosis, edema.  Radials/PT 2+ and equal bilaterally.  Respiratory:  Respirations regular and unlabored, clear to auscultation bilaterally. GI: Soft, nontender, nondistended. MS: No deformity or atrophy. Skin: Warm and dry, no rash. Neuro:  Strength and sensation are intact. Psych: Normal affect.  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 Given her dialysis status, there is no current percutaneous option to repair/replace her tricuspid valve.  - Given dialysis status, and prior sternotomy, she is felt to be too high risk for surgical TVR - Her right heart cath last year (2022) showed normal right sided filling pressures, with a v-wave consistent with significant TR - I will discuss the case with the transplant team. Given her normal right heart filling pressures, it may be reasonable to proceed with kidney transplant. Her left and right heart function are normal, and she has no significant ischemia on stress test. - I'd like to get a treadmill stress test to objectively define her functional status, as I am not clear on how much she does on a regular basis.  -normal ETT per patient through [facility], cleared for the kidney transplant  -update echo  End-stage renal disease on hemodialysis M, W, F -stable, euvolemic on exam today -BP has not dropped -continue midodrine and sensipar  4. Chronic pain in her back -given tramadol in the ER, that helped -recommended chronic pain specialist for management  ADDENDUM:  Echocardiogram reviewed and no further cardiovascular testing needed for upcoming lumbar injection.  I will send my note to requesting party.     Disposition: Follow up 6 months with Armanda Magic, MD or APP.  Signed, Sharlene Dory, PA-C 08/14/2022, 4:18 PM [facility]

## 2022-08-14 NOTE — Patient Instructions (Signed)
Medication Instructions:  Your physician recommends that you continue on your current medications as directed. Please refer to the Current Medication list given to you today.  *If you need a refill on your cardiac medications before your next appointment, please call your pharmacy*  Lab Work: None ordered If you have labs (blood work) drawn today and your tests are completely normal, you will receive your results only by: MyChart Message (if you have MyChart) OR A paper copy in the mail If you have any lab test that is abnormal or we need to change your treatment, we will call you to review the results.   Testing/Procedures: Your physician has requested that you have an echocardiogram. Echocardiography is a painless test that uses sound waves to create images of your heart. It provides your doctor with information about the size and shape of your heart and how well your heart's chambers and valves are working. This procedure takes approximately one hour. There are no restrictions for this procedure. Please do NOT wear cologne, perfume, aftershave, or lotions (deodorant is allowed). Please arrive 15 minutes prior to your appointment time.    Follow-Up: At [facility], you and your health needs are our priority.  As part of our continuing mission to provide you with exceptional heart care, we have created designated Provider Care Teams.  These Care Teams include your primary Cardiologist (physician) and Advanced Practice Providers (APPs -  Physician Assistants and Nurse Practitioners) who all work together to provide you with the care you need, when you need it.  Your next appointment:   6 month(s)  Provider:   Armanda Magic, MD    Other Instructions Check your blood pressure daily, 1 hr after morning medications for 2 weeks, keep a log and send [place] the readings through mychart at the end of the 2 weeks.   Heart-Healthy Eating Plan Many factors influence your heart health,  including eating and exercise habits. Heart health is also called coronary health. Coronary risk increases with abnormal blood fat (lipid) levels. A heart-healthy eating plan includes limiting unhealthy fats, increasing healthy fats, limiting salt (sodium) intake, and making other diet and lifestyle changes. What is my plan? Your health care provider may recommend that: You limit your fat intake to _________% or less of your total calories each day. You limit your saturated fat intake to _________% or less of your total calories each day. You limit the amount of cholesterol in your diet to less than _________ mg per day. You limit the amount of sodium in your diet to less than _________ mg per day. What are tips for following this plan? Cooking Cook foods using methods other than frying. Baking, boiling, grilling, and broiling are all good options. Other ways to reduce fat include: Removing the skin from poultry. Removing all visible fats from meats. Steaming vegetables in water or broth. Meal planning  At meals, imagine dividing your plate into fourths: Fill one-half of your plate with vegetables and green salads. Fill one-fourth of your plate with whole grains. Fill one-fourth of your plate with lean protein foods. Eat 2-4 cups of vegetables per day. One cup of vegetables equals 1 cup (91 g) broccoli or cauliflower florets, 2 medium carrots, 1 large bell pepper, 1 large sweet potato, 1 large tomato, 1 medium white potato, 2 cups (150 g) raw leafy greens. Eat 1-2 cups of fruit per day. One cup of fruit equals 1 small apple, 1 large banana, 1 cup (237 g) mixed fruit, 1  large orange,  cup (82 g) dried fruit, 1 cup (240 mL) 100% fruit juice. Eat more foods that contain soluble fiber. Examples include apples, broccoli, carrots, beans, peas, and barley. Aim to get 25-30 g of fiber per day. Increase your consumption of legumes, nuts, and seeds to 4-5 servings per week. One serving of dried beans  or legumes equals  cup (90 g) cooked, 1 serving of nuts is  oz (12 almonds, 24 pistachios, or 7 walnut halves), and 1 serving of seeds equals  oz (8 g). Fats Choose healthy fats more often. Choose monounsaturated and polyunsaturated fats, such as olive and canola oils, avocado oil, flaxseeds, walnuts, almonds, and seeds. Eat more omega-3 fats. Choose salmon, mackerel, sardines, tuna, flaxseed oil, and ground flaxseeds. Aim to eat fish at least 2 times each week. Check food labels carefully to identify foods with trans fats or high amounts of saturated fat. Limit saturated fats. These are found in animal products, such as meats, butter, and cream. Plant sources of saturated fats include palm oil, palm kernel oil, and coconut oil. Avoid foods with partially hydrogenated oils in them. These contain trans fats. Examples are stick margarine, some tub margarines, cookies, crackers, and other baked goods. Avoid fried foods. General information Eat more home-cooked food and less restaurant, buffet, and fast food. Limit or avoid alcohol. Limit foods that are high in added sugar and simple starches such as foods made using white refined flour (white breads, pastries, sweets). Lose weight if you are overweight. Losing just 5-10% of your body weight can help your overall health and prevent diseases such as diabetes and heart disease. Monitor your sodium intake, especially if you have high blood pressure. Talk with your health care provider about your sodium intake. Try to incorporate more vegetarian meals weekly. What foods should I eat? Fruits All fresh, canned (in natural juice), or frozen fruits. Vegetables Fresh or frozen vegetables (raw, steamed, roasted, or grilled). Green salads. Grains Most grains. Choose whole wheat and whole grains most of the time. Rice and pasta, including brown rice and pastas made with whole wheat. Meats and other proteins Lean, well-trimmed beef, veal, pork, and lamb.  Chicken and [place] without skin. All fish and shellfish. Wild duck, rabbit, pheasant, and venison. Egg whites or low-cholesterol egg substitutes. Dried beans, peas, lentils, and tofu. Seeds and most nuts. Dairy Low-fat or nonfat cheeses, including ricotta and mozzarella. Skim or 1% milk (liquid, powdered, or evaporated). Buttermilk made with low-fat milk. Nonfat or low-fat yogurt. Fats and oils Non-hydrogenated (trans-free) margarines. Vegetable oils, including soybean, sesame, sunflower, olive, avocado, peanut, safflower, corn, canola, and cottonseed. Salad dressings or mayonnaise made with a vegetable oil. Beverages Water (mineral or sparkling). Coffee and tea. Unsweetened ice tea. Diet beverages. Sweets and desserts Sherbet, gelatin, and fruit ice. Small amounts of dark chocolate. Limit all sweets and desserts. Seasonings and condiments All seasonings and condiments. The items listed above may not be a complete list of foods and beverages you can eat. Contact a dietitian for more options. What foods should I avoid? Fruits Canned fruit in heavy syrup. Fruit in cream or butter sauce. Fried fruit. Limit coconut. Vegetables Vegetables cooked in cheese, cream, or butter sauce. Fried vegetables. Grains Breads made with saturated or trans fats, oils, or whole milk. Croissants. Sweet rolls. Donuts. High-fat crackers, such as cheese crackers and chips. Meats and other proteins Fatty meats, such as hot dogs, ribs, sausage, bacon, rib-eye roast or steak. High-fat deli meats, such as salami and bologna.  Caviar. Domestic duck and goose. Organ meats, such as liver. Dairy Cream, sour cream, cream cheese, and creamed cottage cheese. Whole-milk cheeses. Whole or 2% milk (liquid, evaporated, or condensed). Whole buttermilk. Cream sauce or high-fat cheese sauce. Whole-milk yogurt. Fats and oils Meat fat, or shortening. Cocoa butter, hydrogenated oils, palm oil, coconut oil, palm kernel oil. Solid fats and  shortenings, including bacon fat, salt pork, lard, and butter. Nondairy cream substitutes. Salad dressings with cheese or sour cream. Beverages Regular sodas and any drinks with added sugar. Sweets and desserts Frosting. Pudding. Cookies. Cakes. Pies. Milk chocolate or white chocolate. Buttered syrups. Full-fat ice cream or ice cream drinks. The items listed above may not be a complete list of foods and beverages to avoid. Contact a dietitian for more information. Summary Heart-healthy meal planning includes limiting unhealthy fats, increasing healthy fats, limiting salt (sodium) intake and making other diet and lifestyle changes. Lose weight if you are overweight. Losing just 5-10% of your body weight can help your overall health and prevent diseases such as diabetes and heart disease. Focus on eating a balance of foods, including fruits and vegetables, low-fat or nonfat dairy, lean protein, nuts and legumes, whole grains, and heart-healthy oils and fats. This information is not intended to replace advice given to you by your health care provider. Make sure you discuss any questions you have with your health care provider. Document Revised: 03/20/2021 Document Reviewed: 03/20/2021 Elsevier Patient Education  2024 Elsevier Inc.  Low-Sodium Eating Plan Salt (sodium) helps you keep a healthy balance of fluids in your body. Too much sodium can raise your blood pressure. It can also cause fluid and waste to be held in your body. Your health care provider or dietitian may recommend a low-sodium eating plan if you have high blood pressure (hypertension), kidney disease, liver disease, or heart failure. Eating less sodium can help lower your blood pressure and reduce swelling. It can also protect your heart, liver, and kidneys. What are tips for following this plan? Reading food labels  Check food labels for the amount of sodium per serving. If you eat more than one serving, you must multiply the  listed amount by the number of servings. Choose foods with less than 140 milligrams (mg) of sodium per serving. Avoid foods with 300 mg of sodium or more per serving. Always check how much sodium is in a product, even if the label says "unsalted" or "no salt added." Shopping  Buy products labeled as "low-sodium" or "no salt added." Buy fresh foods. Avoid canned foods and pre-made or frozen meals. Avoid canned, cured, or processed meats. Buy breads that have less than 80 mg of sodium per slice. Cooking  Eat more home-cooked food. Try to eat less restaurant, buffet, and fast food. Try not to add salt when you cook. Use salt-free seasonings or herbs instead of table salt or sea salt. Check with your provider or pharmacist before using salt substitutes. Cook with plant-based oils, such as canola, sunflower, or olive oil. Meal planning When eating at a restaurant, ask if your food can be made with less salt or no salt. Avoid dishes labeled as brined, pickled, cured, or smoked. Avoid dishes made with soy sauce, miso, or teriyaki sauce. Avoid foods that have monosodium glutamate (MSG) in them. MSG may be added to some restaurant food, sauces, soups, bouillon, and canned foods. Make meals that can be grilled, baked, poached, roasted, or steamed. These are often made with less sodium. General information Try to limit  your sodium intake to 1,500-2,300 mg each day, or the amount told by your provider. What foods should I eat? Fruits Fresh, frozen, or canned fruit. Fruit juice. Vegetables Fresh or frozen vegetables. "No salt added" canned vegetables. "No salt added" tomato sauce and paste. Low-sodium or reduced-sodium tomato and vegetable juice. Grains Low-sodium cereals, such as oats, puffed wheat and rice, and shredded wheat. Low-sodium crackers. Unsalted rice. Unsalted pasta. Low-sodium bread. Whole grain breads and whole grain pasta. Meats and other proteins Fresh or frozen meat, poultry,  seafood, and fish. These should have no added salt. Low-sodium canned tuna and salmon. Unsalted nuts. Dried peas, beans, and lentils without added salt. Unsalted canned beans. Eggs. Unsalted nut butters. Dairy Milk. Soy milk. Cheese that is naturally low in sodium, such as ricotta cheese, fresh mozzarella, or Swiss cheese. Low-sodium or reduced-sodium cheese. Cream cheese. Yogurt. Seasonings and condiments Fresh and dried herbs and spices. Salt-free seasonings. Low-sodium mustard and ketchup. Sodium-free salad dressing. Sodium-free light mayonnaise. Fresh or refrigerated horseradish. Lemon juice. Vinegar. Other foods Homemade, reduced-sodium, or low-sodium soups. Unsalted popcorn and pretzels. Low-salt or salt-free chips. The items listed above may not be all the foods and drinks you can have. Talk to a dietitian to learn more. What foods should I avoid? Vegetables Sauerkraut, pickled vegetables, and relishes. Olives. [place] fries. Onion rings. Regular canned vegetables, except low-sodium or reduced-sodium items. Regular canned tomato sauce and paste. Regular tomato and vegetable juice. Frozen vegetables in sauces. Grains Instant hot cereals. Bread stuffing, pancake, and biscuit mixes. Croutons. Seasoned rice or pasta mixes. Noodle soup cups. Boxed or frozen macaroni and cheese. Regular salted crackers. Self-rising flour. Meats and other proteins Meat or fish that is salted, canned, smoked, spiced, or pickled. Precooked or cured meat, such as sausages or meat loaves. Tomasa Blase. Ham. Pepperoni. Hot dogs. Corned beef. Chipped beef. Salt pork. Jerky. Pickled herring, anchovies, and sardines. Regular canned tuna. Salted nuts. Dairy Processed cheese and cheese spreads. Hard cheeses. Cheese curds. Blue cheese. Feta cheese. String cheese. Regular cottage cheese. Buttermilk. Canned milk. Fats and oils Salted butter. Regular margarine. Ghee. Bacon fat. Seasonings and condiments Onion salt, garlic salt,  seasoned salt, table salt, and sea salt. Canned and packaged gravies. Worcestershire sauce. Tartar sauce. Barbecue sauce. Teriyaki sauce. Soy sauce, including reduced-sodium soy sauce. Steak sauce. Fish sauce. Oyster sauce. Cocktail sauce. Horseradish that you find on the shelf. Regular ketchup and mustard. Meat flavorings and tenderizers. Bouillon cubes. Hot sauce. Pre-made or packaged marinades. Pre-made or packaged taco seasonings. Relishes. Regular salad dressings. Salsa. Other foods Salted popcorn and pretzels. Corn chips and puffs. Potato and tortilla chips. Canned or dried soups. Pizza. Frozen entrees and pot pies. The items listed above may not be all the foods and drinks you should avoid. Talk to a dietitian to learn more. This information is not intended to replace advice given to you by your health care provider. Make sure you discuss any questions you have with your health care provider. Document Revised: 03/01/2022 Document Reviewed: 03/01/2022 Elsevier Patient Education  2024 [employer].

## 2022-08-15 DIAGNOSIS — N2581 Secondary hyperparathyroidism of renal origin: Secondary | ICD-10-CM | POA: Diagnosis not present

## 2022-08-15 DIAGNOSIS — D509 Iron deficiency anemia, unspecified: Secondary | ICD-10-CM | POA: Diagnosis not present

## 2022-08-15 DIAGNOSIS — D631 Anemia in chronic kidney disease: Secondary | ICD-10-CM | POA: Diagnosis not present

## 2022-08-15 DIAGNOSIS — N186 End stage renal disease: Secondary | ICD-10-CM | POA: Diagnosis not present

## 2022-08-15 DIAGNOSIS — Z992 Dependence on renal dialysis: Secondary | ICD-10-CM | POA: Diagnosis not present

## 2022-08-17 DIAGNOSIS — Z992 Dependence on renal dialysis: Secondary | ICD-10-CM | POA: Diagnosis not present

## 2022-08-17 DIAGNOSIS — N186 End stage renal disease: Secondary | ICD-10-CM | POA: Diagnosis not present

## 2022-08-17 DIAGNOSIS — D509 Iron deficiency anemia, unspecified: Secondary | ICD-10-CM | POA: Diagnosis not present

## 2022-08-17 DIAGNOSIS — D631 Anemia in chronic kidney disease: Secondary | ICD-10-CM | POA: Diagnosis not present

## 2022-08-17 DIAGNOSIS — N2581 Secondary hyperparathyroidism of renal origin: Secondary | ICD-10-CM | POA: Diagnosis not present

## 2022-08-20 DIAGNOSIS — N186 End stage renal disease: Secondary | ICD-10-CM | POA: Diagnosis not present

## 2022-08-20 DIAGNOSIS — Z992 Dependence on renal dialysis: Secondary | ICD-10-CM | POA: Diagnosis not present

## 2022-08-20 DIAGNOSIS — N2581 Secondary hyperparathyroidism of renal origin: Secondary | ICD-10-CM | POA: Diagnosis not present

## 2022-08-20 DIAGNOSIS — D631 Anemia in chronic kidney disease: Secondary | ICD-10-CM | POA: Diagnosis not present

## 2022-08-20 DIAGNOSIS — D509 Iron deficiency anemia, unspecified: Secondary | ICD-10-CM | POA: Diagnosis not present

## 2022-08-21 DIAGNOSIS — L603 Nail dystrophy: Secondary | ICD-10-CM | POA: Diagnosis not present

## 2022-08-21 DIAGNOSIS — I739 Peripheral vascular disease, unspecified: Secondary | ICD-10-CM | POA: Diagnosis not present

## 2022-08-22 DIAGNOSIS — D509 Iron deficiency anemia, unspecified: Secondary | ICD-10-CM | POA: Diagnosis not present

## 2022-08-22 DIAGNOSIS — N2581 Secondary hyperparathyroidism of renal origin: Secondary | ICD-10-CM | POA: Diagnosis not present

## 2022-08-22 DIAGNOSIS — D631 Anemia in chronic kidney disease: Secondary | ICD-10-CM | POA: Diagnosis not present

## 2022-08-22 DIAGNOSIS — Z992 Dependence on renal dialysis: Secondary | ICD-10-CM | POA: Diagnosis not present

## 2022-08-22 DIAGNOSIS — N186 End stage renal disease: Secondary | ICD-10-CM | POA: Diagnosis not present

## 2022-08-23 ENCOUNTER — Telehealth: Payer: Self-pay | Admitting: *Deleted

## 2022-08-23 NOTE — Telephone Encounter (Signed)
Spoke with patient. Patient reports ongoing vaginal itching and clear/milky vag d/c with odor for the past 1-2 months. She has tried monistat OTC 3 and 7 day with no change in symptoms.  Denies vag bleeding or pain. Reports CKD, patient states she is unable to determine if any urinary symptoms present.   Last AEX 12/14/21 /ML  OV scheduled for 7/2 at 1345 with Dr. Seymour Bars.   Routing to provider for final review. Patient is agreeable to disposition. Will close encounter.

## 2022-08-24 DIAGNOSIS — D509 Iron deficiency anemia, unspecified: Secondary | ICD-10-CM | POA: Diagnosis not present

## 2022-08-24 DIAGNOSIS — Z992 Dependence on renal dialysis: Secondary | ICD-10-CM | POA: Diagnosis not present

## 2022-08-24 DIAGNOSIS — N2581 Secondary hyperparathyroidism of renal origin: Secondary | ICD-10-CM | POA: Diagnosis not present

## 2022-08-24 DIAGNOSIS — N186 End stage renal disease: Secondary | ICD-10-CM | POA: Diagnosis not present

## 2022-08-24 DIAGNOSIS — D631 Anemia in chronic kidney disease: Secondary | ICD-10-CM | POA: Diagnosis not present

## 2022-08-27 DIAGNOSIS — N039 Chronic nephritic syndrome with unspecified morphologic changes: Secondary | ICD-10-CM | POA: Diagnosis not present

## 2022-08-27 DIAGNOSIS — N2581 Secondary hyperparathyroidism of renal origin: Secondary | ICD-10-CM | POA: Diagnosis not present

## 2022-08-27 DIAGNOSIS — Z992 Dependence on renal dialysis: Secondary | ICD-10-CM | POA: Diagnosis not present

## 2022-08-27 DIAGNOSIS — N186 End stage renal disease: Secondary | ICD-10-CM | POA: Diagnosis not present

## 2022-08-27 DIAGNOSIS — D631 Anemia in chronic kidney disease: Secondary | ICD-10-CM | POA: Diagnosis not present

## 2022-08-27 DIAGNOSIS — D509 Iron deficiency anemia, unspecified: Secondary | ICD-10-CM | POA: Diagnosis not present

## 2022-08-28 ENCOUNTER — Encounter: Payer: Self-pay | Admitting: Obstetrics & Gynecology

## 2022-08-28 ENCOUNTER — Ambulatory Visit (INDEPENDENT_AMBULATORY_CARE_PROVIDER_SITE_OTHER): Payer: Medicare Other | Admitting: Obstetrics & Gynecology

## 2022-08-28 VITALS — BP 110/64 | HR 57 | Temp 98.2°F

## 2022-08-28 DIAGNOSIS — N898 Other specified noninflammatory disorders of vagina: Secondary | ICD-10-CM | POA: Diagnosis not present

## 2022-08-28 LAB — WET PREP FOR TRICH, YEAST, CLUE

## 2022-08-28 MED ORDER — NYSTATIN-TRIAMCINOLONE 100000-0.1 UNIT/GM-% EX OINT: 1.0000 | TOPICAL_OINTMENT | Freq: Two times a day (BID) | CUTANEOUS | 2 refills | Status: AC

## 2022-08-28 NOTE — Progress Notes (Signed)
    [name] Marks March 14, 1955 [id number]        67 y.o.  G2P2L2   RP: Vaginal itching and discharge  HPI: Patient c/o vaginal itching and discharge x a few weeks.  No improvement with Monistat Rx.  Some pelvic discomfort.  CT scan abdo/pelvis 07/27/22 showed no acute findings.   OB History  Gravida Para Term Preterm AB Living  2 2 2     2   SAB IAB Ectopic Multiple Live Births               # Outcome Date GA Lbr Len/2nd Weight Sex Delivery Anes PTL Lv  2 Term           1 Term             Past medical history,surgical history, problem list, medications, allergies, family history and social history were all reviewed and documented in the EPIC chart.   Directed ROS with pertinent positives and negatives documented in the history of present illness/assessment and plan.  Exam:  Vitals:   08/28/22 1334  BP: 110/64  Pulse: (!) 57  Temp: 98.2 F (36.8 C)  TempSrc: Oral  SpO2: 99%   General appearance:  Normal  Abdomen: Normal  Gynecologic exam: Vulva normal.  Speculum:  Cervix/Vagina normal.  Increased whitish d/c.  Wet prep done.  Bimanual exam:  AV uterus, normal volume, mobile, NT.  No adnexal mass, NT.  CT scan abdo/pelvis 07/27/22: Reproductive: Few small calcified uterine fibroids are again seen, largest measuring 1.5 cm. Adnexal regions are unremarkable.  Wet prep Negative   Assessment/Plan:  67 y.o. G2P2002   1. Vaginal discharge Patient c/o vaginal itching and discharge x a few weeks.  No improvement with Monistat Rx.  Some pelvic discomfort.  CT scan abdo/pelvis 07/27/22 showed no acute findings.  Wet prep Neg.  Will treat symptoms with Mycolog ointment.  Usage reviewed, prescription sent to pharmacy. - WET PREP FOR TRICH, YEAST, CLUE  Other orders - nystatin-triamcinolone ointment (MYCOLOG); Apply 1 Application topically 2 (two) times daily for 14 days.   Genia Del MD, 1:41 PM 08/28/2022

## 2022-08-29 DIAGNOSIS — N186 End stage renal disease: Secondary | ICD-10-CM | POA: Diagnosis not present

## 2022-08-29 DIAGNOSIS — D631 Anemia in chronic kidney disease: Secondary | ICD-10-CM | POA: Diagnosis not present

## 2022-08-29 DIAGNOSIS — Z992 Dependence on renal dialysis: Secondary | ICD-10-CM | POA: Diagnosis not present

## 2022-08-29 DIAGNOSIS — D509 Iron deficiency anemia, unspecified: Secondary | ICD-10-CM | POA: Diagnosis not present

## 2022-08-29 DIAGNOSIS — N2581 Secondary hyperparathyroidism of renal origin: Secondary | ICD-10-CM | POA: Diagnosis not present

## 2022-08-31 DIAGNOSIS — N186 End stage renal disease: Secondary | ICD-10-CM | POA: Diagnosis not present

## 2022-08-31 DIAGNOSIS — D631 Anemia in chronic kidney disease: Secondary | ICD-10-CM | POA: Diagnosis not present

## 2022-08-31 DIAGNOSIS — Z992 Dependence on renal dialysis: Secondary | ICD-10-CM | POA: Diagnosis not present

## 2022-08-31 DIAGNOSIS — D509 Iron deficiency anemia, unspecified: Secondary | ICD-10-CM | POA: Diagnosis not present

## 2022-08-31 DIAGNOSIS — N2581 Secondary hyperparathyroidism of renal origin: Secondary | ICD-10-CM | POA: Diagnosis not present

## 2022-09-03 DIAGNOSIS — D631 Anemia in chronic kidney disease: Secondary | ICD-10-CM | POA: Diagnosis not present

## 2022-09-03 DIAGNOSIS — N2581 Secondary hyperparathyroidism of renal origin: Secondary | ICD-10-CM | POA: Diagnosis not present

## 2022-09-03 DIAGNOSIS — Z992 Dependence on renal dialysis: Secondary | ICD-10-CM | POA: Diagnosis not present

## 2022-09-03 DIAGNOSIS — D509 Iron deficiency anemia, unspecified: Secondary | ICD-10-CM | POA: Diagnosis not present

## 2022-09-03 DIAGNOSIS — N186 End stage renal disease: Secondary | ICD-10-CM | POA: Diagnosis not present

## 2022-09-05 DIAGNOSIS — D631 Anemia in chronic kidney disease: Secondary | ICD-10-CM | POA: Diagnosis not present

## 2022-09-05 DIAGNOSIS — D509 Iron deficiency anemia, unspecified: Secondary | ICD-10-CM | POA: Diagnosis not present

## 2022-09-05 DIAGNOSIS — N2581 Secondary hyperparathyroidism of renal origin: Secondary | ICD-10-CM | POA: Diagnosis not present

## 2022-09-05 DIAGNOSIS — Z992 Dependence on renal dialysis: Secondary | ICD-10-CM | POA: Diagnosis not present

## 2022-09-05 DIAGNOSIS — N186 End stage renal disease: Secondary | ICD-10-CM | POA: Diagnosis not present

## 2022-09-06 ENCOUNTER — Telehealth: Payer: Self-pay | Admitting: *Deleted

## 2022-09-06 DIAGNOSIS — M4310 Spondylolisthesis, site unspecified: Secondary | ICD-10-CM | POA: Diagnosis not present

## 2022-09-06 NOTE — Telephone Encounter (Signed)
   Pre-operative Risk Assessment    Patient Name: [name] Marks  DOB: 1955-03-12 MRN: [id number]      Request for Surgical Clearance    Procedure:   L4-L5 ESI  Date of Surgery:  Clearance TBD                                 Surgeon:  DR. D[facility] Surgeon's Group or Practice Name:  [facility] Phone number:  [phone] Fax number:  [phone]   Type of Clearance Requested:   - Medical  - Pharmacy:  Hold Clopidogrel (Plavix) x 7 DAYS PRIOR ; RESUME DAY AFTER PROCEDURE   Type of Anesthesia:  Not Indicated   Additional requests/questions:    Elpidio Anis   09/06/2022, 6:03 PM

## 2022-09-08 DIAGNOSIS — N186 End stage renal disease: Secondary | ICD-10-CM | POA: Diagnosis not present

## 2022-09-08 DIAGNOSIS — N2581 Secondary hyperparathyroidism of renal origin: Secondary | ICD-10-CM | POA: Diagnosis not present

## 2022-09-08 DIAGNOSIS — Z992 Dependence on renal dialysis: Secondary | ICD-10-CM | POA: Diagnosis not present

## 2022-09-08 DIAGNOSIS — D631 Anemia in chronic kidney disease: Secondary | ICD-10-CM | POA: Diagnosis not present

## 2022-09-08 DIAGNOSIS — D509 Iron deficiency anemia, unspecified: Secondary | ICD-10-CM | POA: Diagnosis not present

## 2022-09-10 DIAGNOSIS — Z992 Dependence on renal dialysis: Secondary | ICD-10-CM | POA: Diagnosis not present

## 2022-09-10 DIAGNOSIS — D631 Anemia in chronic kidney disease: Secondary | ICD-10-CM | POA: Diagnosis not present

## 2022-09-10 DIAGNOSIS — N2581 Secondary hyperparathyroidism of renal origin: Secondary | ICD-10-CM | POA: Diagnosis not present

## 2022-09-10 DIAGNOSIS — D509 Iron deficiency anemia, unspecified: Secondary | ICD-10-CM | POA: Diagnosis not present

## 2022-09-10 DIAGNOSIS — N186 End stage renal disease: Secondary | ICD-10-CM | POA: Diagnosis not present

## 2022-09-12 DIAGNOSIS — Z992 Dependence on renal dialysis: Secondary | ICD-10-CM | POA: Diagnosis not present

## 2022-09-12 DIAGNOSIS — N2581 Secondary hyperparathyroidism of renal origin: Secondary | ICD-10-CM | POA: Diagnosis not present

## 2022-09-12 DIAGNOSIS — D509 Iron deficiency anemia, unspecified: Secondary | ICD-10-CM | POA: Diagnosis not present

## 2022-09-12 DIAGNOSIS — N186 End stage renal disease: Secondary | ICD-10-CM | POA: Diagnosis not present

## 2022-09-12 DIAGNOSIS — D631 Anemia in chronic kidney disease: Secondary | ICD-10-CM | POA: Diagnosis not present

## 2022-09-13 ENCOUNTER — Ambulatory Visit (HOSPITAL_COMMUNITY): Payer: Medicare Other | Attending: Physician Assistant

## 2022-09-13 DIAGNOSIS — I071 Rheumatic tricuspid insufficiency: Secondary | ICD-10-CM

## 2022-09-13 DIAGNOSIS — I340 Nonrheumatic mitral (valve) insufficiency: Secondary | ICD-10-CM

## 2022-09-13 LAB — ECHOCARDIOGRAM COMPLETE
AV Mean grad: 10 mmHg
AV Peak grad: 15.1 mmHg
Ao pk vel: 1.95 m/s
Area-P 1/2: 4.06 cm2
S' Lateral: 2 cm

## 2022-09-14 DIAGNOSIS — D509 Iron deficiency anemia, unspecified: Secondary | ICD-10-CM | POA: Diagnosis not present

## 2022-09-14 DIAGNOSIS — N2581 Secondary hyperparathyroidism of renal origin: Secondary | ICD-10-CM | POA: Diagnosis not present

## 2022-09-14 DIAGNOSIS — Z992 Dependence on renal dialysis: Secondary | ICD-10-CM | POA: Diagnosis not present

## 2022-09-14 DIAGNOSIS — N186 End stage renal disease: Secondary | ICD-10-CM | POA: Diagnosis not present

## 2022-09-14 DIAGNOSIS — D631 Anemia in chronic kidney disease: Secondary | ICD-10-CM | POA: Diagnosis not present

## 2022-09-17 DIAGNOSIS — N2581 Secondary hyperparathyroidism of renal origin: Secondary | ICD-10-CM | POA: Diagnosis not present

## 2022-09-17 DIAGNOSIS — N186 End stage renal disease: Secondary | ICD-10-CM | POA: Diagnosis not present

## 2022-09-17 DIAGNOSIS — D509 Iron deficiency anemia, unspecified: Secondary | ICD-10-CM | POA: Diagnosis not present

## 2022-09-17 DIAGNOSIS — Z992 Dependence on renal dialysis: Secondary | ICD-10-CM | POA: Diagnosis not present

## 2022-09-17 DIAGNOSIS — D631 Anemia in chronic kidney disease: Secondary | ICD-10-CM | POA: Diagnosis not present

## 2022-09-18 NOTE — Telephone Encounter (Signed)
Clearance sent today, note addendum.   Sharlene Dory, PA-C

## 2022-09-18 NOTE — Telephone Encounter (Signed)
[facility] called in stating they received clinical notes, however she stated it did not say whether pt was cleared to have injection or not. Please advise.

## 2022-09-19 DIAGNOSIS — D631 Anemia in chronic kidney disease: Secondary | ICD-10-CM | POA: Diagnosis not present

## 2022-09-19 DIAGNOSIS — N2581 Secondary hyperparathyroidism of renal origin: Secondary | ICD-10-CM | POA: Diagnosis not present

## 2022-09-19 DIAGNOSIS — N186 End stage renal disease: Secondary | ICD-10-CM | POA: Diagnosis not present

## 2022-09-19 DIAGNOSIS — D509 Iron deficiency anemia, unspecified: Secondary | ICD-10-CM | POA: Diagnosis not present

## 2022-09-19 DIAGNOSIS — Z992 Dependence on renal dialysis: Secondary | ICD-10-CM | POA: Diagnosis not present

## 2022-09-21 ENCOUNTER — Telehealth: Payer: Self-pay

## 2022-09-21 DIAGNOSIS — Z992 Dependence on renal dialysis: Secondary | ICD-10-CM | POA: Diagnosis not present

## 2022-09-21 DIAGNOSIS — I50810 Right heart failure, unspecified: Secondary | ICD-10-CM

## 2022-09-21 DIAGNOSIS — D509 Iron deficiency anemia, unspecified: Secondary | ICD-10-CM | POA: Diagnosis not present

## 2022-09-21 DIAGNOSIS — D631 Anemia in chronic kidney disease: Secondary | ICD-10-CM | POA: Diagnosis not present

## 2022-09-21 DIAGNOSIS — N186 End stage renal disease: Secondary | ICD-10-CM | POA: Diagnosis not present

## 2022-09-21 DIAGNOSIS — N2581 Secondary hyperparathyroidism of renal origin: Secondary | ICD-10-CM | POA: Diagnosis not present

## 2022-09-21 NOTE — Telephone Encounter (Signed)
-----   Message from Nurse Deon Pilling sent at 09/19/2022  6:10 PM EDT -----  ----- Message ----- From: Quintella Reichert, MD Sent: 09/19/2022   3:02 PM EDT To: Sharlene Dory, PA-C; [facility] Triage  Needs to see Advanced heart failure for right heart failure ----- Message ----- From: Sharlene Dory, PA-C Sent: 09/18/2022   8:24 AM EDT To: Quintella Reichert, MD; [facility] Triage  [name] Marks,   Normal heart pump function.  You still have severe tricuspid regurgitation which is consistent with your echo back in 2021.  Mild leak of your mitral valve.  Your aortic valve replacement is functioning well.  Overall, stable results.  Would be comfortable clearing you for your upcoming lumbar injection.  I will send this to Dr. Mayford Knife as an [place]ain Childes.  Thanks! Sharlene Dory, PA-C

## 2022-09-21 NOTE — Telephone Encounter (Signed)
Called patient to review Dr. Norris Cross recommendation that she be referred to advanced heart failure clinic for right heart failure. Advised patient that right heart failure can lead to increased pressures in lungs, in addition to classic symptoms of heart failure such as leg swelling and SOB. Patient verbalizes understanding and agrees to plan. Order placed.

## 2022-09-24 DIAGNOSIS — D631 Anemia in chronic kidney disease: Secondary | ICD-10-CM | POA: Diagnosis not present

## 2022-09-24 DIAGNOSIS — D509 Iron deficiency anemia, unspecified: Secondary | ICD-10-CM | POA: Diagnosis not present

## 2022-09-24 DIAGNOSIS — N186 End stage renal disease: Secondary | ICD-10-CM | POA: Diagnosis not present

## 2022-09-24 DIAGNOSIS — Z992 Dependence on renal dialysis: Secondary | ICD-10-CM | POA: Diagnosis not present

## 2022-09-24 DIAGNOSIS — N2581 Secondary hyperparathyroidism of renal origin: Secondary | ICD-10-CM | POA: Diagnosis not present

## 2022-09-26 DIAGNOSIS — N186 End stage renal disease: Secondary | ICD-10-CM | POA: Diagnosis not present

## 2022-09-26 DIAGNOSIS — Z992 Dependence on renal dialysis: Secondary | ICD-10-CM | POA: Diagnosis not present

## 2022-09-26 DIAGNOSIS — D631 Anemia in chronic kidney disease: Secondary | ICD-10-CM | POA: Diagnosis not present

## 2022-09-26 DIAGNOSIS — N2581 Secondary hyperparathyroidism of renal origin: Secondary | ICD-10-CM | POA: Diagnosis not present

## 2022-09-26 DIAGNOSIS — D509 Iron deficiency anemia, unspecified: Secondary | ICD-10-CM | POA: Diagnosis not present

## 2022-09-27 DIAGNOSIS — Z992 Dependence on renal dialysis: Secondary | ICD-10-CM | POA: Diagnosis not present

## 2022-09-27 DIAGNOSIS — N039 Chronic nephritic syndrome with unspecified morphologic changes: Secondary | ICD-10-CM | POA: Diagnosis not present

## 2022-09-27 DIAGNOSIS — N186 End stage renal disease: Secondary | ICD-10-CM | POA: Diagnosis not present

## 2022-09-28 DIAGNOSIS — D631 Anemia in chronic kidney disease: Secondary | ICD-10-CM | POA: Diagnosis not present

## 2022-09-28 DIAGNOSIS — D509 Iron deficiency anemia, unspecified: Secondary | ICD-10-CM | POA: Diagnosis not present

## 2022-09-28 DIAGNOSIS — N186 End stage renal disease: Secondary | ICD-10-CM | POA: Diagnosis not present

## 2022-09-28 DIAGNOSIS — N2581 Secondary hyperparathyroidism of renal origin: Secondary | ICD-10-CM | POA: Diagnosis not present

## 2022-09-28 DIAGNOSIS — Z992 Dependence on renal dialysis: Secondary | ICD-10-CM | POA: Diagnosis not present

## 2022-10-01 DIAGNOSIS — N186 End stage renal disease: Secondary | ICD-10-CM | POA: Diagnosis not present

## 2022-10-01 DIAGNOSIS — D509 Iron deficiency anemia, unspecified: Secondary | ICD-10-CM | POA: Diagnosis not present

## 2022-10-01 DIAGNOSIS — N2581 Secondary hyperparathyroidism of renal origin: Secondary | ICD-10-CM | POA: Diagnosis not present

## 2022-10-01 DIAGNOSIS — D631 Anemia in chronic kidney disease: Secondary | ICD-10-CM | POA: Diagnosis not present

## 2022-10-01 DIAGNOSIS — Z992 Dependence on renal dialysis: Secondary | ICD-10-CM | POA: Diagnosis not present

## 2022-10-03 DIAGNOSIS — N186 End stage renal disease: Secondary | ICD-10-CM | POA: Diagnosis not present

## 2022-10-03 DIAGNOSIS — D631 Anemia in chronic kidney disease: Secondary | ICD-10-CM | POA: Diagnosis not present

## 2022-10-03 DIAGNOSIS — D509 Iron deficiency anemia, unspecified: Secondary | ICD-10-CM | POA: Diagnosis not present

## 2022-10-03 DIAGNOSIS — Z992 Dependence on renal dialysis: Secondary | ICD-10-CM | POA: Diagnosis not present

## 2022-10-03 DIAGNOSIS — N2581 Secondary hyperparathyroidism of renal origin: Secondary | ICD-10-CM | POA: Diagnosis not present

## 2022-10-05 DIAGNOSIS — N186 End stage renal disease: Secondary | ICD-10-CM | POA: Diagnosis not present

## 2022-10-05 DIAGNOSIS — Z992 Dependence on renal dialysis: Secondary | ICD-10-CM | POA: Diagnosis not present

## 2022-10-05 DIAGNOSIS — D509 Iron deficiency anemia, unspecified: Secondary | ICD-10-CM | POA: Diagnosis not present

## 2022-10-05 DIAGNOSIS — D631 Anemia in chronic kidney disease: Secondary | ICD-10-CM | POA: Diagnosis not present

## 2022-10-05 DIAGNOSIS — N2581 Secondary hyperparathyroidism of renal origin: Secondary | ICD-10-CM | POA: Diagnosis not present

## 2022-10-08 DIAGNOSIS — Z992 Dependence on renal dialysis: Secondary | ICD-10-CM | POA: Diagnosis not present

## 2022-10-08 DIAGNOSIS — D631 Anemia in chronic kidney disease: Secondary | ICD-10-CM | POA: Diagnosis not present

## 2022-10-08 DIAGNOSIS — D509 Iron deficiency anemia, unspecified: Secondary | ICD-10-CM | POA: Diagnosis not present

## 2022-10-08 DIAGNOSIS — N2581 Secondary hyperparathyroidism of renal origin: Secondary | ICD-10-CM | POA: Diagnosis not present

## 2022-10-08 DIAGNOSIS — N186 End stage renal disease: Secondary | ICD-10-CM | POA: Diagnosis not present

## 2022-10-10 DIAGNOSIS — D509 Iron deficiency anemia, unspecified: Secondary | ICD-10-CM | POA: Diagnosis not present

## 2022-10-10 DIAGNOSIS — Z992 Dependence on renal dialysis: Secondary | ICD-10-CM | POA: Diagnosis not present

## 2022-10-10 DIAGNOSIS — N186 End stage renal disease: Secondary | ICD-10-CM | POA: Diagnosis not present

## 2022-10-10 DIAGNOSIS — N2581 Secondary hyperparathyroidism of renal origin: Secondary | ICD-10-CM | POA: Diagnosis not present

## 2022-10-10 DIAGNOSIS — D631 Anemia in chronic kidney disease: Secondary | ICD-10-CM | POA: Diagnosis not present

## 2022-10-12 DIAGNOSIS — N186 End stage renal disease: Secondary | ICD-10-CM | POA: Diagnosis not present

## 2022-10-12 DIAGNOSIS — D631 Anemia in chronic kidney disease: Secondary | ICD-10-CM | POA: Diagnosis not present

## 2022-10-12 DIAGNOSIS — D509 Iron deficiency anemia, unspecified: Secondary | ICD-10-CM | POA: Diagnosis not present

## 2022-10-12 DIAGNOSIS — Z992 Dependence on renal dialysis: Secondary | ICD-10-CM | POA: Diagnosis not present

## 2022-10-12 DIAGNOSIS — N2581 Secondary hyperparathyroidism of renal origin: Secondary | ICD-10-CM | POA: Diagnosis not present

## 2022-10-15 DIAGNOSIS — Z992 Dependence on renal dialysis: Secondary | ICD-10-CM | POA: Diagnosis not present

## 2022-10-15 DIAGNOSIS — N2581 Secondary hyperparathyroidism of renal origin: Secondary | ICD-10-CM | POA: Diagnosis not present

## 2022-10-15 DIAGNOSIS — D509 Iron deficiency anemia, unspecified: Secondary | ICD-10-CM | POA: Diagnosis not present

## 2022-10-15 DIAGNOSIS — N186 End stage renal disease: Secondary | ICD-10-CM | POA: Diagnosis not present

## 2022-10-15 DIAGNOSIS — D631 Anemia in chronic kidney disease: Secondary | ICD-10-CM | POA: Diagnosis not present

## 2022-10-16 ENCOUNTER — Other Ambulatory Visit: Payer: Self-pay | Admitting: Physician Assistant

## 2022-10-16 DIAGNOSIS — E042 Nontoxic multinodular goiter: Secondary | ICD-10-CM

## 2022-10-17 DIAGNOSIS — N2581 Secondary hyperparathyroidism of renal origin: Secondary | ICD-10-CM | POA: Diagnosis not present

## 2022-10-17 DIAGNOSIS — N186 End stage renal disease: Secondary | ICD-10-CM | POA: Diagnosis not present

## 2022-10-17 DIAGNOSIS — D631 Anemia in chronic kidney disease: Secondary | ICD-10-CM | POA: Diagnosis not present

## 2022-10-17 DIAGNOSIS — Z992 Dependence on renal dialysis: Secondary | ICD-10-CM | POA: Diagnosis not present

## 2022-10-17 DIAGNOSIS — D509 Iron deficiency anemia, unspecified: Secondary | ICD-10-CM | POA: Diagnosis not present

## 2022-10-19 DIAGNOSIS — N2581 Secondary hyperparathyroidism of renal origin: Secondary | ICD-10-CM | POA: Diagnosis not present

## 2022-10-19 DIAGNOSIS — Z992 Dependence on renal dialysis: Secondary | ICD-10-CM | POA: Diagnosis not present

## 2022-10-19 DIAGNOSIS — D509 Iron deficiency anemia, unspecified: Secondary | ICD-10-CM | POA: Diagnosis not present

## 2022-10-19 DIAGNOSIS — D631 Anemia in chronic kidney disease: Secondary | ICD-10-CM | POA: Diagnosis not present

## 2022-10-19 DIAGNOSIS — N186 End stage renal disease: Secondary | ICD-10-CM | POA: Diagnosis not present

## 2022-10-22 DIAGNOSIS — D631 Anemia in chronic kidney disease: Secondary | ICD-10-CM | POA: Diagnosis not present

## 2022-10-22 DIAGNOSIS — N186 End stage renal disease: Secondary | ICD-10-CM | POA: Diagnosis not present

## 2022-10-22 DIAGNOSIS — D509 Iron deficiency anemia, unspecified: Secondary | ICD-10-CM | POA: Diagnosis not present

## 2022-10-22 DIAGNOSIS — N2581 Secondary hyperparathyroidism of renal origin: Secondary | ICD-10-CM | POA: Diagnosis not present

## 2022-10-22 DIAGNOSIS — Z992 Dependence on renal dialysis: Secondary | ICD-10-CM | POA: Diagnosis not present

## 2022-10-24 DIAGNOSIS — N2581 Secondary hyperparathyroidism of renal origin: Secondary | ICD-10-CM | POA: Diagnosis not present

## 2022-10-24 DIAGNOSIS — D631 Anemia in chronic kidney disease: Secondary | ICD-10-CM | POA: Diagnosis not present

## 2022-10-24 DIAGNOSIS — N186 End stage renal disease: Secondary | ICD-10-CM | POA: Diagnosis not present

## 2022-10-24 DIAGNOSIS — D509 Iron deficiency anemia, unspecified: Secondary | ICD-10-CM | POA: Diagnosis not present

## 2022-10-24 DIAGNOSIS — Z992 Dependence on renal dialysis: Secondary | ICD-10-CM | POA: Diagnosis not present

## 2022-10-25 ENCOUNTER — Ambulatory Visit
Admission: RE | Admit: 2022-10-25 | Discharge: 2022-10-25 | Disposition: A | Discharge status: Home / Self Care | Payer: Medicare Other | Source: Ambulatory Visit | Attending: Physician Assistant | Admitting: Physician Assistant

## 2022-10-25 DIAGNOSIS — E042 Nontoxic multinodular goiter: Secondary | ICD-10-CM

## 2022-10-25 DIAGNOSIS — E041 Nontoxic single thyroid nodule: Secondary | ICD-10-CM | POA: Diagnosis not present

## 2022-10-26 DIAGNOSIS — D509 Iron deficiency anemia, unspecified: Secondary | ICD-10-CM | POA: Diagnosis not present

## 2022-10-26 DIAGNOSIS — D631 Anemia in chronic kidney disease: Secondary | ICD-10-CM | POA: Diagnosis not present

## 2022-10-26 DIAGNOSIS — N2581 Secondary hyperparathyroidism of renal origin: Secondary | ICD-10-CM | POA: Diagnosis not present

## 2022-10-26 DIAGNOSIS — N186 End stage renal disease: Secondary | ICD-10-CM | POA: Diagnosis not present

## 2022-10-26 DIAGNOSIS — Z992 Dependence on renal dialysis: Secondary | ICD-10-CM | POA: Diagnosis not present

## 2022-10-28 DIAGNOSIS — N186 End stage renal disease: Secondary | ICD-10-CM | POA: Diagnosis not present

## 2022-10-28 DIAGNOSIS — Z992 Dependence on renal dialysis: Secondary | ICD-10-CM | POA: Diagnosis not present

## 2022-10-28 DIAGNOSIS — N039 Chronic nephritic syndrome with unspecified morphologic changes: Secondary | ICD-10-CM | POA: Diagnosis not present

## 2022-10-29 DIAGNOSIS — D631 Anemia in chronic kidney disease: Secondary | ICD-10-CM | POA: Diagnosis not present

## 2022-10-29 DIAGNOSIS — N186 End stage renal disease: Secondary | ICD-10-CM | POA: Diagnosis not present

## 2022-10-29 DIAGNOSIS — D509 Iron deficiency anemia, unspecified: Secondary | ICD-10-CM | POA: Diagnosis not present

## 2022-10-29 DIAGNOSIS — Z992 Dependence on renal dialysis: Secondary | ICD-10-CM | POA: Diagnosis not present

## 2022-10-29 DIAGNOSIS — N2581 Secondary hyperparathyroidism of renal origin: Secondary | ICD-10-CM | POA: Diagnosis not present

## 2022-10-31 ENCOUNTER — Encounter (HOSPITAL_COMMUNITY): Payer: Self-pay | Admitting: *Deleted

## 2022-10-31 ENCOUNTER — Ambulatory Visit (HOSPITAL_COMMUNITY)
Admission: EM | Admit: 2022-10-31 | Discharge: 2022-10-31 | Disposition: A | Discharge status: Home / Self Care | Payer: Medicare Other | Attending: Emergency Medicine | Admitting: Emergency Medicine

## 2022-10-31 DIAGNOSIS — R109 Unspecified abdominal pain: Secondary | ICD-10-CM

## 2022-10-31 DIAGNOSIS — R1011 Right upper quadrant pain: Secondary | ICD-10-CM

## 2022-10-31 DIAGNOSIS — N186 End stage renal disease: Secondary | ICD-10-CM | POA: Diagnosis not present

## 2022-10-31 DIAGNOSIS — Z992 Dependence on renal dialysis: Secondary | ICD-10-CM | POA: Diagnosis not present

## 2022-10-31 DIAGNOSIS — N2581 Secondary hyperparathyroidism of renal origin: Secondary | ICD-10-CM | POA: Diagnosis not present

## 2022-10-31 DIAGNOSIS — D509 Iron deficiency anemia, unspecified: Secondary | ICD-10-CM | POA: Diagnosis not present

## 2022-10-31 DIAGNOSIS — D631 Anemia in chronic kidney disease: Secondary | ICD-10-CM | POA: Diagnosis not present

## 2022-10-31 MED ORDER — TRAMADOL HCL 50 MG PO TABS: 50.0000 mg | ORAL_TABLET | Freq: Two times a day (BID) | ORAL | 0 refills | Status: DC | PRN

## 2022-10-31 MED ORDER — ONDANSETRON 4 MG PO TBDP: 4.0000 mg | ORAL_TABLET | Freq: Three times a day (TID) | ORAL | 0 refills | Status: DC | PRN

## 2022-10-31 NOTE — ED Provider Notes (Signed)
[facility]    CSN: [id number] Arrival date & time: 10/31/22  1252      History   Chief Complaint Chief Complaint  Patient presents with   Flank Pain    HPI [name] Marks is a 67 y.o. female.   Patient presents for evaluation of right-sided flank pain with associated nausea without vomiting beginning this morning while at dialysis.  Pain has been constant, does not radiate, can be felt with range of motion.  Has not attempted to eat or drink since symptoms began.  Last bowel movement this morning, no change from baseline.  History of right sided back pain but this feels different.  Has not attempted treatment of symptoms.  Denies fever, URI symptoms, abdominal bloating, heartburn or indigestion, increased gas production, urinary symptoms, vaginal symptoms.  Denies change in activity, pushing pulling or lifting.  Past Medical History:  Diagnosis Date   Anemia    Aortic valve prosthesis present 02/25/2016   Arthritis    "knees" (01/03/2017)   AVD (aortic valve disease) 07/12/2016   Backache 12/06/2008   Bacteremia due to coagulase-negative Staphylococcus    Carpal tunnel syndrome    Cerebral embolism with transient ischemic attack (TIA)    Cholelithiases 01/28/2017   Chronic female pelvic pain 08/08/2012   Complication of anesthesia    01/01/17- '"a long time ago, difficulty breathimg, not sure if it was due to anesthesia or not."   CVA (cerebral vascular accident) (HCC) 05/16/2016   End stage renal disease on dialysis (HCC)    "MWF; [address] (01/03/2017)   Endocarditis    Esophageal reflux 12/06/2008   GERD (gastroesophageal reflux disease)    Gout    History of blood transfusion 2017   "related to blood poison" (01/03/2017)   Increased endometrial stripe thickness 11/03/2015   Neck pain    Osteoporosis 09/2016   T score -2.6   Pain and swelling of right upper extremity 12/20/2015   Pulmonary HTN (HCC) 07/12/2016   Renal dialysis device, implant, or graft  complication 12/20/2015   Renal insufficiency    S/P cholecystectomy 02/14/2017   Septic shock (HCC)    Staphylococcus aureus bacteremia    TIA (transient ischemic attack)    "several" (01/03/2017)    Patient Active Problem List   Diagnosis Date Noted   Personal history of colonic polyps 05/16/2021   Endocarditis 07/30/2020   Hyperparathyroidism due to renal insufficiency (HCC) 07/30/2020   Tooth, broken 12/18/2018   Hypotension 10/03/2018   Diarrhea, unspecified 08/25/2018   Orthostatic hypotension 03/14/2018   S/P cholecystectomy 02/14/2017   Osteoporosis 12/11/2016   Cholelithiasis 11/07/2016   Cerebral embolism with transient ischemic attack (TIA)    Bilateral carpal tunnel syndrome 08/09/2016   AVD (aortic valve disease) 07/12/2016   Pulmonary HTN (HCC) 07/12/2016   Carpal tunnel syndrome 07/05/2016   Disorder of both mastoids 05/29/2016   CVA (cerebral vascular accident) (HCC) 05/16/2016   History of aortic valve replacement with bioprosthetic valve 05/08/2016   Bacteremia due to coagulase-negative Staphylococcus    History of endocarditis    Anemia    Complex endometrial hyperplasia with atypia 11/08/2015   Dependence on renal dialysis (HCC) 12/15/2014   Obesity 10/28/2014   Right carotid bruit 01/08/2013   Generalized headaches 01/08/2013   Chronic female pelvic pain 08/08/2012   Gout, unspecified 12/06/2008   Esophageal reflux 12/06/2008   Backache 12/06/2008   FIBROIDS, UTERUS 11/24/2008   Diverticulosis of large intestine without perforation or abscess without bleeding 03/22/2008  ESRD (end stage renal disease) on dialysis (HCC) 07/01/2006   Anemia in chronic kidney disease 12/18/2001    Past Surgical History:  Procedure Laterality Date   AORTIC VALVE REPLACEMENT N/A 02/25/2016   Procedure: AORTIC VALVE REPLACEMENT (AVR) implanted with Magna Ease Aortic valve size 21mm;  Surgeon: Loreli Slot, MD;  Location: [facility] OR;  Service: Open Heart Surgery;   Laterality: N/A;   AV FISTULA PLACEMENT Left 01/03/2017   Procedure: INSERTION OF ARTERIOVENOUS (AV) GORE-TEX GRAFT LEFT THIGH;  Surgeon: Nada Libman, MD;  Location: [facility] OR;  Service: Vascular;  Laterality: Left;   AVGG REMOVAL Right 02/17/2016   Procedure: REMOVAL OF TWO ARTERIOVENOUS GORETEX GRAFTS (AVGG);  Surgeon: Chuck Hint, MD;  Location: [facility] OR;  Service: Vascular;  Laterality: Right;   BIOPSY  05/16/2021   Procedure: BIOPSY;  Surgeon: Charlott Rakes, MD;  Location: [facility];  Service: Endoscopy;;   BREAST BIOPSY Left 2013   stereo    BREAST BIOPSY Right 2011   stereo    CARDIAC VALVE REPLACEMENT     CHOLECYSTECTOMY N/A 01/30/2017   Procedure: LAPAROSCOPIC CHOLECYSTECTOMY;  Surgeon: Harriette Bouillon, MD;  Location: [facility] OR;  Service: General;  Laterality: N/A;   COLONOSCOPY W/ POLYPECTOMY     COLONOSCOPY WITH PROPOFOL N/A 05/16/2021   Procedure: COLONOSCOPY WITH PROPOFOL;  Surgeon: Charlott Rakes, MD;  Location: [facility];  Service: Endoscopy;  Laterality: N/A;   DG AV DIALYSIS GRAFT DECLOT OR     DILATATION & CURETTAGE/HYSTEROSCOPY WITH TRUECLEAR N/A 11/06/2012   Procedure: DILATATION & CURETTAGE/HYSTEROSCOPY WITH TRUECLEAR;  Surgeon: Ok Edwards, MD;  Location: [facility]S;  Service: Gynecology;  Laterality: N/A;  Truclear Resectoscopic Polypectomy    INSERTION OF DIALYSIS CATHETER N/A 02/19/2016   Procedure: INSERTION OF Left Internal Jugular DIALYSIS CATHETER;  Surgeon: Chuck Hint, MD;  Location: [facility] OR;  Service: Vascular;  Laterality: N/A;   LOOP RECORDER INSERTION N/A 10/01/2016   Procedure: LOOP RECORDER INSERTION;  Surgeon: Regan Lemming, MD;  [facility];  Service: Cardiovascular;  Laterality: N/A;   PATCH ANGIOPLASTY Right 02/17/2016   Procedure: PATCH ANGIOPLASTY;  Surgeon: Chuck Hint, MD;  Location: [facility] OR;  Service: Vascular;  Laterality: Right;   PERIPHERAL VASCULAR CATHETERIZATION N/A 09/14/2014    Procedure: A/V Shuntogram/Fistulagram;  Surgeon: Renford Dills, MD;  Location: [facility];  Service: Cardiovascular;  Laterality: N/A;   PERIPHERAL VASCULAR CATHETERIZATION N/A 09/14/2014   Procedure: A/V Shunt Intervention;  Surgeon: Renford Dills, MD;  Location: [facility];  Service: Cardiovascular;  Laterality: N/A;   PERIPHERAL VASCULAR CATHETERIZATION Right 12/09/2014   Procedure: A/V Shuntogram/Fistulagram;  Surgeon: Annice Needy, MD;  Location: [facility];  Service: Cardiovascular;  Laterality: Right;   PERIPHERAL VASCULAR CATHETERIZATION N/A 12/09/2014   Procedure: A/V Shunt Intervention;  Surgeon: Annice Needy, MD;  Location: [facility];  Service: Cardiovascular;  Laterality: N/A;   PERIPHERAL VASCULAR CATHETERIZATION Right 05/24/2015   Procedure: A/V Shuntogram;  Surgeon: Nada Libman, MD;  [facility];  Service: Cardiovascular;  Laterality: Right;   PERIPHERAL VASCULAR CATHETERIZATION Right 05/24/2015   Procedure: Peripheral Vascular Balloon Angioplasty;  Surgeon: Nada Libman, MD;  [facility];  Service: Cardiovascular;  Laterality: Right;  right arm shunt   PERIPHERAL VASCULAR CATHETERIZATION N/A 06/13/2015   Procedure: A/V Shuntogram/Fistulagram;  Surgeon: Annice Needy, MD;  Location: [facility];  Service: Cardiovascular;  Laterality:  N/A;   PERIPHERAL VASCULAR CATHETERIZATION N/A 06/13/2015   Procedure: A/V Shunt Intervention;  Surgeon: Annice Needy, MD;  Location: [facility];  Service: Cardiovascular;  Laterality: N/A;   TEE WITHOUT CARDIOVERSION N/A 02/22/2016   Procedure: TRANSESOPHAGEAL ECHOCARDIOGRAM (TEE);  Surgeon: Chrystie Nose, MD;  Location: [facility] ENDOSCOPY;  Service: Cardiovascular;  Laterality: N/A;   TEE WITHOUT CARDIOVERSION N/A 02/25/2016   Procedure: TRANSESOPHAGEAL ECHOCARDIOGRAM (TEE);  Surgeon: Loreli Slot, MD;  Location: [facility] OR;  Service: Open Heart Surgery;   Laterality: N/A;   TEE WITHOUT CARDIOVERSION N/A 10/01/2016   Procedure: TRANSESOPHAGEAL ECHOCARDIOGRAM (TEE);  Surgeon: Chrystie Nose, MD;  Location: [facility] ENDOSCOPY;  Service: Cardiovascular;  Laterality: N/A;   TUBAL LIGATION  1983    OB History     Gravida  2   Para  2   Term  2   Preterm      AB      Living  2      SAB      IAB      Ectopic      Multiple      Live Births               Home Medications    Prior to Admission medications   Medication Sig Start Date End Date Taking? Authorizing Provider  cinacalcet (SENSIPAR) 90 MG tablet Take 90 mg by mouth at bedtime.   Yes [provider]  clopidogrel (PLAVIX) 75 MG tablet Take 1 tablet (75 mg total) by mouth daily. 05/15/22  Yes Conte, Tessa N, PA-C  famotidine (PEPCID) 20 MG tablet Take 20 mg by mouth daily after supper.   Yes [provider]  midodrine (PROAMATINE) 10 MG tablet Take 10 mg by mouth See admin instructions. Take 1 tablet (10 mg) by mouth before dialysis & take 1 tablet (10 mg) while at dialysis on Mondays, Wednesdays & Fridays. 08/15/20  Yes [provider]  sevelamer carbonate (RENVELA) 800 MG tablet Take 2,400 mg by mouth See admin instructions. Take 3 tablets (2400 mg) by mouth after each meal and after each snack   Yes [provider]  Multiple Vitamins-Minerals (PRORENAL QD PO) Take 1 tablet by mouth at bedtime.     [provider]    Family History Family History  Problem Relation Age of Onset   Diabetes Mother    Hypertension Mother    Heart disease Mother    Alcohol abuse Mother    Kidney disease Mother    Cancer Father        COLON   Alcohol abuse Father    Breast cancer Sister 79   Hypertension Sister    Kidney disease Sister    Breast cancer Sister        breast cancer   Hypertension Son    Kidney disease Son     Social History Social History   Tobacco Use   Smoking status: Never   Smokeless tobacco: Never  Vaping Use    Vaping status: Never Used  Substance Use Topics   Alcohol use: No   Drug use: No     Allergies   Iodinated contrast media, Cefazolin, and Naproxen sodium   Review of Systems Review of Systems  Respiratory: Negative.    Cardiovascular: Negative.   Gastrointestinal: Negative.   Genitourinary:  Positive for flank pain. Negative for decreased urine volume, difficulty urinating, dyspareunia, dysuria, enuresis, frequency, genital sores, hematuria, menstrual problem, pelvic pain, urgency, vaginal bleeding,  vaginal discharge and vaginal pain.     Physical Exam Triage Vital Signs ED Triage Vitals  Encounter Vitals Group     BP 10/31/22 1357 (!) 104/59     Systolic BP Percentile --      Diastolic BP Percentile --      Pulse Rate 10/31/22 1357 (!) 59     Resp 10/31/22 1357 20     Temp 10/31/22 1357 97.6 F (36.4 C)     Temp src --      SpO2 10/31/22 1357 98 %     Weight --      Height --      Head Circumference --      Peak Flow --      Pain Score 10/31/22 1354 8     Pain Loc --      Pain Education --      Exclude from Growth Chart --    No data found.  Updated Vital Signs BP (!) 104/59   Pulse (!) 59   Temp 97.6 F (36.4 C)   Resp 20   SpO2 98%   Visual Acuity Right Eye Distance:   Left Eye Distance:   Bilateral Distance:    Right Eye Near:   Left Eye Near:    Bilateral Near:     Physical Exam Constitutional:      Appearance: Normal appearance.  Eyes:     Extraocular Movements: Extraocular movements intact.  Abdominal:     General: Abdomen is flat. Bowel sounds are normal.     Palpations: Abdomen is soft.     Comments: Tenderness present to the right upper quadrant and the right flank into the right lumbar region  Neurological:     Mental Status: She is alert and oriented to person, place, and time. Mental status is at baseline.      UC Treatments / Results  Labs (all labs ordered are listed, but only abnormal results are displayed) Labs  Reviewed - No data to display  EKG   Radiology No results found.  Procedures Procedures (including critical care time)  Medications Ordered in UC Medications - No data to display  Initial Impression / Assessment and Plan / UC Course  I have reviewed the triage vital signs and the nursing notes.  Pertinent labs & imaging results that were available during my care of the patient were reviewed by me and considered in my medical decision making (see chart for details).  Right upper quadrant pain, right flank pain  Vital signs are stable, patient is in no signs of distress nontoxic-appearing, tenderness is noted to the right upper quadrant and right flank, also present to the right lumbar region which is at baseline, per chart review patient has had same symptoms in May 2024, at that time she was sent to the emergency department where CT imaging was negative, discussed, at this time patient would like to hold off on ED transfer, in agreement with plan, she is stable for outpatient and symptom management, prescribed tramadol and Zofran, PDMP reviewed, low risk, has had success with medicine in the past for pain, recommended supportive care through heat or ice, massage stretching and rest with activity as tolerated, advised increase fluid intake with a bland diet, may progress as tolerated, given strict precautions that any point if symptoms worsen in severity she is to go to the nearest emergency department for immediate evaluation, verbalized understanding Final Clinical Impressions(s) / UC Diagnoses   Final diagnoses:  None  Discharge Instructions   None    ED Prescriptions   None    PDMP not reviewed this encounter.   Valinda Hoar, NP 10/31/22 1444

## 2022-10-31 NOTE — ED Triage Notes (Addendum)
Pt reports Rt flank pain started this AM during Dialysis.  PT does not make urine.

## 2022-10-31 NOTE — Discharge Instructions (Addendum)
Today you are evaluated for your right sided pain, on exam you have tenderness to the right upper quadrant of your abdomen and right side, organs that typically lie underneath this area or the liver gallbladder and colon, your gallbladder has been removed, have a low suspicion that your liver or colon are irritated  After review of your chart you have had similar symptoms in the past, last documented in May 2024, at that time imaging was completed in the emergency department and all organs looked okay therefore I would like to attempt to manage her symptoms at this time  You may use tramadol every 12 hours as needed for management of pain, be mindful this may make you feel sleepy, attempt use of Tylenol first  You may use Zofran every 8 hours for management of nausea, placed under tongue and allow to dissolve then wait 30 minutes before attempting to eat or drink  Until symptoms have resolved increase your fluid intake, may eat food as tolerated recommend a blander diet as this is easier to digest  May use cooling packs or ice packs over the affected area in 10 to 15-minute intervals  May place pillows behind the back for comfort and support  May massage and stretch the area as tolerated  At any point if your symptoms worsen please go to the nearest emergency department for immediate evaluation

## 2022-11-02 DIAGNOSIS — D509 Iron deficiency anemia, unspecified: Secondary | ICD-10-CM | POA: Diagnosis not present

## 2022-11-02 DIAGNOSIS — Z992 Dependence on renal dialysis: Secondary | ICD-10-CM | POA: Diagnosis not present

## 2022-11-02 DIAGNOSIS — N186 End stage renal disease: Secondary | ICD-10-CM | POA: Diagnosis not present

## 2022-11-02 DIAGNOSIS — N2581 Secondary hyperparathyroidism of renal origin: Secondary | ICD-10-CM | POA: Diagnosis not present

## 2022-11-02 DIAGNOSIS — D631 Anemia in chronic kidney disease: Secondary | ICD-10-CM | POA: Diagnosis not present

## 2022-11-05 DIAGNOSIS — D631 Anemia in chronic kidney disease: Secondary | ICD-10-CM | POA: Diagnosis not present

## 2022-11-05 DIAGNOSIS — D509 Iron deficiency anemia, unspecified: Secondary | ICD-10-CM | POA: Diagnosis not present

## 2022-11-05 DIAGNOSIS — N2581 Secondary hyperparathyroidism of renal origin: Secondary | ICD-10-CM | POA: Diagnosis not present

## 2022-11-05 DIAGNOSIS — N186 End stage renal disease: Secondary | ICD-10-CM | POA: Diagnosis not present

## 2022-11-05 DIAGNOSIS — Z992 Dependence on renal dialysis: Secondary | ICD-10-CM | POA: Diagnosis not present

## 2022-11-07 DIAGNOSIS — Z992 Dependence on renal dialysis: Secondary | ICD-10-CM | POA: Diagnosis not present

## 2022-11-07 DIAGNOSIS — N2581 Secondary hyperparathyroidism of renal origin: Secondary | ICD-10-CM | POA: Diagnosis not present

## 2022-11-07 DIAGNOSIS — D509 Iron deficiency anemia, unspecified: Secondary | ICD-10-CM | POA: Diagnosis not present

## 2022-11-07 DIAGNOSIS — D631 Anemia in chronic kidney disease: Secondary | ICD-10-CM | POA: Diagnosis not present

## 2022-11-07 DIAGNOSIS — N186 End stage renal disease: Secondary | ICD-10-CM | POA: Diagnosis not present

## 2022-11-09 DIAGNOSIS — D509 Iron deficiency anemia, unspecified: Secondary | ICD-10-CM | POA: Diagnosis not present

## 2022-11-09 DIAGNOSIS — Z992 Dependence on renal dialysis: Secondary | ICD-10-CM | POA: Diagnosis not present

## 2022-11-09 DIAGNOSIS — N2581 Secondary hyperparathyroidism of renal origin: Secondary | ICD-10-CM | POA: Diagnosis not present

## 2022-11-09 DIAGNOSIS — D631 Anemia in chronic kidney disease: Secondary | ICD-10-CM | POA: Diagnosis not present

## 2022-11-09 DIAGNOSIS — N186 End stage renal disease: Secondary | ICD-10-CM | POA: Diagnosis not present

## 2022-11-12 DIAGNOSIS — N186 End stage renal disease: Secondary | ICD-10-CM | POA: Diagnosis not present

## 2022-11-12 DIAGNOSIS — Z992 Dependence on renal dialysis: Secondary | ICD-10-CM | POA: Diagnosis not present

## 2022-11-12 DIAGNOSIS — D631 Anemia in chronic kidney disease: Secondary | ICD-10-CM | POA: Diagnosis not present

## 2022-11-12 DIAGNOSIS — D509 Iron deficiency anemia, unspecified: Secondary | ICD-10-CM | POA: Diagnosis not present

## 2022-11-12 DIAGNOSIS — N2581 Secondary hyperparathyroidism of renal origin: Secondary | ICD-10-CM | POA: Diagnosis not present

## 2022-11-14 DIAGNOSIS — N2581 Secondary hyperparathyroidism of renal origin: Secondary | ICD-10-CM | POA: Diagnosis not present

## 2022-11-14 DIAGNOSIS — D631 Anemia in chronic kidney disease: Secondary | ICD-10-CM | POA: Diagnosis not present

## 2022-11-14 DIAGNOSIS — N186 End stage renal disease: Secondary | ICD-10-CM | POA: Diagnosis not present

## 2022-11-14 DIAGNOSIS — Z992 Dependence on renal dialysis: Secondary | ICD-10-CM | POA: Diagnosis not present

## 2022-11-14 DIAGNOSIS — D509 Iron deficiency anemia, unspecified: Secondary | ICD-10-CM | POA: Diagnosis not present

## 2022-11-16 DIAGNOSIS — Z992 Dependence on renal dialysis: Secondary | ICD-10-CM | POA: Diagnosis not present

## 2022-11-16 DIAGNOSIS — D631 Anemia in chronic kidney disease: Secondary | ICD-10-CM | POA: Diagnosis not present

## 2022-11-16 DIAGNOSIS — N2581 Secondary hyperparathyroidism of renal origin: Secondary | ICD-10-CM | POA: Diagnosis not present

## 2022-11-16 DIAGNOSIS — N186 End stage renal disease: Secondary | ICD-10-CM | POA: Diagnosis not present

## 2022-11-16 DIAGNOSIS — D509 Iron deficiency anemia, unspecified: Secondary | ICD-10-CM | POA: Diagnosis not present

## 2022-11-19 DIAGNOSIS — N186 End stage renal disease: Secondary | ICD-10-CM | POA: Diagnosis not present

## 2022-11-19 DIAGNOSIS — N2581 Secondary hyperparathyroidism of renal origin: Secondary | ICD-10-CM | POA: Diagnosis not present

## 2022-11-19 DIAGNOSIS — D631 Anemia in chronic kidney disease: Secondary | ICD-10-CM | POA: Diagnosis not present

## 2022-11-19 DIAGNOSIS — Z992 Dependence on renal dialysis: Secondary | ICD-10-CM | POA: Diagnosis not present

## 2022-11-19 DIAGNOSIS — D509 Iron deficiency anemia, unspecified: Secondary | ICD-10-CM | POA: Diagnosis not present

## 2022-11-20 DIAGNOSIS — I739 Peripheral vascular disease, unspecified: Secondary | ICD-10-CM | POA: Diagnosis not present

## 2022-11-20 DIAGNOSIS — L84 Corns and callosities: Secondary | ICD-10-CM | POA: Diagnosis not present

## 2022-11-20 DIAGNOSIS — L603 Nail dystrophy: Secondary | ICD-10-CM | POA: Diagnosis not present

## 2022-11-21 DIAGNOSIS — N2581 Secondary hyperparathyroidism of renal origin: Secondary | ICD-10-CM | POA: Diagnosis not present

## 2022-11-21 DIAGNOSIS — Z992 Dependence on renal dialysis: Secondary | ICD-10-CM | POA: Diagnosis not present

## 2022-11-21 DIAGNOSIS — N186 End stage renal disease: Secondary | ICD-10-CM | POA: Diagnosis not present

## 2022-11-21 DIAGNOSIS — D631 Anemia in chronic kidney disease: Secondary | ICD-10-CM | POA: Diagnosis not present

## 2022-11-21 DIAGNOSIS — D509 Iron deficiency anemia, unspecified: Secondary | ICD-10-CM | POA: Diagnosis not present

## 2022-11-23 DIAGNOSIS — N186 End stage renal disease: Secondary | ICD-10-CM | POA: Diagnosis not present

## 2022-11-23 DIAGNOSIS — D631 Anemia in chronic kidney disease: Secondary | ICD-10-CM | POA: Diagnosis not present

## 2022-11-23 DIAGNOSIS — Z992 Dependence on renal dialysis: Secondary | ICD-10-CM | POA: Diagnosis not present

## 2022-11-23 DIAGNOSIS — N2581 Secondary hyperparathyroidism of renal origin: Secondary | ICD-10-CM | POA: Diagnosis not present

## 2022-11-23 DIAGNOSIS — D509 Iron deficiency anemia, unspecified: Secondary | ICD-10-CM | POA: Diagnosis not present

## 2022-11-26 DIAGNOSIS — D631 Anemia in chronic kidney disease: Secondary | ICD-10-CM | POA: Diagnosis not present

## 2022-11-26 DIAGNOSIS — N186 End stage renal disease: Secondary | ICD-10-CM | POA: Diagnosis not present

## 2022-11-26 DIAGNOSIS — N2581 Secondary hyperparathyroidism of renal origin: Secondary | ICD-10-CM | POA: Diagnosis not present

## 2022-11-26 DIAGNOSIS — Z992 Dependence on renal dialysis: Secondary | ICD-10-CM | POA: Diagnosis not present

## 2022-11-26 DIAGNOSIS — D509 Iron deficiency anemia, unspecified: Secondary | ICD-10-CM | POA: Diagnosis not present

## 2022-11-27 DIAGNOSIS — I871 Compression of vein: Secondary | ICD-10-CM | POA: Diagnosis not present

## 2022-11-27 DIAGNOSIS — N186 End stage renal disease: Secondary | ICD-10-CM | POA: Diagnosis not present

## 2022-11-27 DIAGNOSIS — T82858A Stenosis of vascular prosthetic devices, implants and grafts, initial encounter: Secondary | ICD-10-CM | POA: Diagnosis not present

## 2022-11-27 DIAGNOSIS — Z992 Dependence on renal dialysis: Secondary | ICD-10-CM | POA: Diagnosis not present

## 2022-11-27 DIAGNOSIS — N039 Chronic nephritic syndrome with unspecified morphologic changes: Secondary | ICD-10-CM | POA: Diagnosis not present

## 2022-11-28 DIAGNOSIS — Z23 Encounter for immunization: Secondary | ICD-10-CM | POA: Diagnosis not present

## 2022-11-28 DIAGNOSIS — N2581 Secondary hyperparathyroidism of renal origin: Secondary | ICD-10-CM | POA: Diagnosis not present

## 2022-11-28 DIAGNOSIS — D631 Anemia in chronic kidney disease: Secondary | ICD-10-CM | POA: Diagnosis not present

## 2022-11-28 DIAGNOSIS — Z992 Dependence on renal dialysis: Secondary | ICD-10-CM | POA: Diagnosis not present

## 2022-11-28 DIAGNOSIS — N186 End stage renal disease: Secondary | ICD-10-CM | POA: Diagnosis not present

## 2022-11-28 DIAGNOSIS — D509 Iron deficiency anemia, unspecified: Secondary | ICD-10-CM | POA: Diagnosis not present

## 2022-11-30 DIAGNOSIS — D509 Iron deficiency anemia, unspecified: Secondary | ICD-10-CM | POA: Diagnosis not present

## 2022-11-30 DIAGNOSIS — N2581 Secondary hyperparathyroidism of renal origin: Secondary | ICD-10-CM | POA: Diagnosis not present

## 2022-11-30 DIAGNOSIS — D631 Anemia in chronic kidney disease: Secondary | ICD-10-CM | POA: Diagnosis not present

## 2022-11-30 DIAGNOSIS — Z992 Dependence on renal dialysis: Secondary | ICD-10-CM | POA: Diagnosis not present

## 2022-11-30 DIAGNOSIS — Z23 Encounter for immunization: Secondary | ICD-10-CM | POA: Diagnosis not present

## 2022-11-30 DIAGNOSIS — N186 End stage renal disease: Secondary | ICD-10-CM | POA: Diagnosis not present

## 2022-12-02 ENCOUNTER — Emergency Department (HOSPITAL_COMMUNITY): Payer: Medicare Other

## 2022-12-02 ENCOUNTER — Emergency Department (HOSPITAL_COMMUNITY)
Admission: EM | Admit: 2022-12-02 | Discharge: 2022-12-02 | Disposition: A | Discharge status: Home / Self Care | Payer: Medicare Other | Attending: Emergency Medicine | Admitting: Emergency Medicine

## 2022-12-02 ENCOUNTER — Encounter (HOSPITAL_COMMUNITY): Payer: Self-pay

## 2022-12-02 DIAGNOSIS — Z992 Dependence on renal dialysis: Secondary | ICD-10-CM | POA: Insufficient documentation

## 2022-12-02 DIAGNOSIS — M545 Low back pain, unspecified: Secondary | ICD-10-CM | POA: Diagnosis not present

## 2022-12-02 DIAGNOSIS — D259 Leiomyoma of uterus, unspecified: Secondary | ICD-10-CM | POA: Diagnosis not present

## 2022-12-02 DIAGNOSIS — N186 End stage renal disease: Secondary | ICD-10-CM | POA: Diagnosis not present

## 2022-12-02 DIAGNOSIS — Z7902 Long term (current) use of antithrombotics/antiplatelets: Secondary | ICD-10-CM | POA: Insufficient documentation

## 2022-12-02 DIAGNOSIS — R109 Unspecified abdominal pain: Secondary | ICD-10-CM | POA: Insufficient documentation

## 2022-12-02 DIAGNOSIS — K5730 Diverticulosis of large intestine without perforation or abscess without bleeding: Secondary | ICD-10-CM | POA: Diagnosis not present

## 2022-12-02 LAB — CBC
HCT: 29.5 % — ABNORMAL LOW (ref 36.0–46.0)
Hemoglobin: 9.9 g/dL — ABNORMAL LOW (ref 12.0–15.0)
MCH: 29.6 pg (ref 26.0–34.0)
MCHC: 33.6 g/dL (ref 30.0–36.0)
MCV: 88.1 fL (ref 80.0–100.0)
Platelets: 147 10*3/uL — ABNORMAL LOW (ref 150–400)
RBC: 3.35 MIL/uL — ABNORMAL LOW (ref 3.87–5.11)
RDW: 16.3 % — ABNORMAL HIGH (ref 11.5–15.5)
WBC: 4.5 10*3/uL (ref 4.0–10.5)
nRBC: 0 % (ref 0.0–0.2)

## 2022-12-02 LAB — HEPATIC FUNCTION PANEL
ALT: 15 U/L (ref 0–44)
AST: 20 U/L (ref 15–41)
Albumin: 3.7 g/dL (ref 3.5–5.0)
Alkaline Phosphatase: 164 U/L — ABNORMAL HIGH (ref 38–126)
Bilirubin, Direct: 0.1 mg/dL (ref 0.0–0.2)
Indirect Bilirubin: 0.6 mg/dL (ref 0.3–0.9)
Total Bilirubin: 0.7 mg/dL (ref 0.3–1.2)
Total Protein: 7.1 g/dL (ref 6.5–8.1)

## 2022-12-02 LAB — BASIC METABOLIC PANEL
Anion gap: 12 (ref 5–15)
BUN: 16 mg/dL (ref 8–23)
CO2: 25 mmol/L (ref 22–32)
Calcium: 8.3 mg/dL — ABNORMAL LOW (ref 8.9–10.3)
Chloride: 96 mmol/L — ABNORMAL LOW (ref 98–111)
Creatinine, Ser: 6.07 mg/dL — ABNORMAL HIGH (ref 0.44–1.00)
GFR, Estimated: 7 mL/min — ABNORMAL LOW (ref >60)
Glucose, Bld: 91 mg/dL (ref 70–99)
Potassium: 3 mmol/L — ABNORMAL LOW (ref 3.5–5.1)
Sodium: 133 mmol/L — ABNORMAL LOW (ref 135–145)

## 2022-12-02 LAB — LIPASE, BLOOD: Lipase: 48 U/L (ref 11–51)

## 2022-12-02 MED ORDER — ONDANSETRON HCL 4 MG/2ML IJ SOLN
4.0000 mg | Freq: Once | INTRAMUSCULAR | Status: AC
  Administered 2022-12-02: 4 mg via INTRAVENOUS
  Filled 2022-12-02: qty 2

## 2022-12-02 MED ORDER — TRAMADOL HCL 50 MG PO TABS: 50.0000 mg | ORAL_TABLET | Freq: Two times a day (BID) | ORAL | 0 refills | Status: DC | PRN

## 2022-12-02 MED ORDER — FENTANYL CITRATE PF 50 MCG/ML IJ SOSY
25.0000 ug | PREFILLED_SYRINGE | Freq: Once | INTRAMUSCULAR | Status: AC
  Administered 2022-12-02: 25 ug via INTRAVENOUS
  Filled 2022-12-02: qty 1

## 2022-12-02 NOTE — ED Provider Notes (Signed)
[place] [facility] AT [facility] Provider Note   CSN: [id number] Arrival date & time: 12/02/22  2725     History  Chief Complaint  Patient presents with   Flank Pain    [name] Marks is a 67 y.o. female, history of ESRD, on dialysis, TIA, who presents to the ED secondary to right flank pain, has been going on for the last month, that is acutely got worse in the last 3 days.  She states that she has had this intermittent right flank pain, radiating to her back it has been going on for the last month or so, worse with movement, associated nausea, no vomiting.  Notes that it just came on abruptly, and she has had similar pain before.  Took some tramadol at home which helped it initially, but is now out.  She describes as achy, and tight, and to the right flank radiating to the back.  Denies any kind of fevers, chills.  Did fall about a week ago, but states the pain has been going on longer than that.  States she fell on her butt about a week ago, and has been able to ambulate since then.  No other complaints.  Does not make urine, has been compliant with dialysis, except for the last few days she is felt so poor, that she has not wanted to go to dialysis. Does not make urine.    Home Medications Prior to Admission medications   Medication Sig Start Date End Date Taking? Authorizing Provider  traMADol (ULTRAM) 50 MG tablet Take 1 tablet (50 mg total) by mouth every 12 (twelve) hours as needed. 12/02/22  Yes Kamesha Herne L, PA  cinacalcet (SENSIPAR) 90 MG tablet Take 90 mg by mouth at bedtime.    [provider]  clopidogrel (PLAVIX) 75 MG tablet Take 1 tablet (75 mg total) by mouth daily. 05/15/22   Sharlene Dory, PA-C  famotidine (PEPCID) 20 MG tablet Take 20 mg by mouth daily after supper.    [provider]  midodrine (PROAMATINE) 10 MG tablet Take 10 mg by mouth See admin instructions. Take 1 tablet (10 mg) by mouth before dialysis & take 1 tablet  (10 mg) while at dialysis on Mondays, Wednesdays & Fridays. 08/15/20   [provider]  Multiple Vitamins-Minerals (PRORENAL QD PO) Take 1 tablet by mouth at bedtime.     [provider]  ondansetron (ZOFRAN-ODT) 4 MG disintegrating tablet Take 1 tablet (4 mg total) by mouth every 8 (eight) hours as needed for nausea or vomiting. 10/31/22   Valinda Hoar, NP  sevelamer carbonate (RENVELA) 800 MG tablet Take 2,400 mg by mouth See admin instructions. Take 3 tablets (2400 mg) by mouth after each meal and after each snack    [provider]      Allergies    Iodinated contrast media, Cefazolin, and Naproxen sodium    Review of Systems   Review of Systems  Constitutional:  Negative for fever.  Genitourinary:  Positive for flank pain.    Physical Exam Updated Vital Signs BP (!) 109/49 (BP Location: Right Arm)   Pulse 69   Temp 97.9 F (36.6 C) (Tympanic)   Resp 15   Ht 4\' 11"  (1.499 m)   Wt 68.2 kg   SpO2 100%   BMI 30.37 kg/m  Physical Exam Vitals and nursing note reviewed.  Constitutional:      General: She is not in acute distress.    Appearance:  She is well-developed.  HENT:     Head: Normocephalic and atraumatic.  Eyes:     Conjunctiva/sclera: Conjunctivae normal.  Cardiovascular:     Rate and Rhythm: Normal rate and regular rhythm.     Heart sounds: No murmur heard. Pulmonary:     Effort: Pulmonary effort is normal. No respiratory distress.     Breath sounds: Normal breath sounds.  Abdominal:     Palpations: Abdomen is soft.     Tenderness: There is no abdominal tenderness.  Musculoskeletal:        General: No swelling.     Cervical back: Neck supple.     Comments: TTP of R flank and R lumbar paraspinal muscles, negative SLR. 5/5 strength of BLE. No rash, edema, or warmth. Pain w/truncal rotation.   Skin:    General: Skin is warm and dry.     Capillary Refill: Capillary refill takes less than 2 seconds.  Neurological:     Mental Status:  She is alert.  Psychiatric:        Mood and Affect: Mood normal.     ED Results / Procedures / Treatments   Labs (all labs ordered are listed, but only abnormal results are displayed) Labs Reviewed  BASIC METABOLIC PANEL - Abnormal; Notable for the following components:      Result Value   Sodium 133 (*)    Potassium 3.0 (*)    Chloride 96 (*)    Creatinine, Ser 6.07 (*)    Calcium 8.3 (*)    GFR, Estimated 7 (*)    All other components within normal limits  CBC - Abnormal; Notable for the following components:   RBC 3.35 (*)    Hemoglobin 9.9 (*)    HCT 29.5 (*)    RDW 16.3 (*)    Platelets 147 (*)    All other components within normal limits  HEPATIC FUNCTION PANEL - Abnormal; Notable for the following components:   Alkaline Phosphatase 164 (*)    All other components within normal limits  LIPASE, BLOOD    EKG None  Radiology CT ABDOMEN PELVIS WO CONTRAST  Result Date: 12/02/2022 CLINICAL DATA:  Flank pain.  End-stage renal disease on dialysis. EXAM: CT ABDOMEN AND PELVIS WITHOUT CONTRAST TECHNIQUE: Multidetector CT imaging of the abdomen and pelvis was performed following the standard protocol without IV contrast. RADIATION DOSE REDUCTION: This exam was performed according to the departmental dose-optimization program which includes automated exposure control, adjustment of the mA and/or kV according to patient size and/or use of iterative reconstruction technique. COMPARISON:  07/27/2022 FINDINGS: Lower chest: No acute findings. Hepatobiliary: No mass visualized on this unenhanced exam. Prior cholecystectomy. No evidence of biliary obstruction. Pancreas: No mass or inflammatory process visualized on this unenhanced exam. Spleen:  Within normal limits in size. Adrenals/Urinary tract: Severe bilateral renal parenchymal atrophy, consistent with end-stage renal disease. Punctate left renal calculi versus vascular calcification. No No evidence of ureteral calculi or  hydronephrosis. Stomach/Bowel: No evidence of obstruction, inflammatory process, or abnormal fluid collections. Colonic diverticulosis again seen, without signs of diverticulitis. Vascular/Lymphatic: No pathologically enlarged lymph nodes identified. No evidence of abdominal aortic aneurysm. Reproductive: Stable Verginia Toohey calcified uterine fibroids, largest measuring 1.5 cm. Adnexal regions are unremarkable. Other:  None. Musculoskeletal: No suspicious bone lesions identified. Severe lumbar spine degenerative changes, with grade 1 anterolisthesis at L4-5. IMPRESSION: No acute findings. Severe bilateral renal atrophy, consistent with end-stage renal disease. Punctate left renal calculi versus vascular calcification. No hydronephrosis. Colonic diverticulosis, without radiographic  evidence of diverticulitis. Stable Alveta Quintela calcified uterine fibroids. Electronically Signed   By: Danae Orleans M.D.   On: 12/02/2022 10:20    Procedures Procedures    Medications Ordered in ED Medications  fentaNYL (SUBLIMAZE) injection 25 mcg (25 mcg Intravenous Given 12/02/22 0943)  ondansetron (ZOFRAN) injection 4 mg (4 mg Intravenous Given 12/02/22 0943)    ED Course/ Medical Decision Making/ A&P                                 Medical Decision Making Patient is a 67 year old female, history of ESRD, does not make urine, who presents to the ED secondary to right flank pain, has been going on for the last month, but has gotten worse, in the last 3 days.  States it is achy, radiating to the back, worse with movement.  And associated with nausea.  Has not had any vomiting.  Does not make any urine, denies any fevers, chills, recent rashes  Amount and/or Complexity of Data Reviewed Labs: ordered.    Details: Potassium of 3.0, otherwise unremarkable Radiology: ordered.    Details: CT renal, shows bilateral renal atrophy, with left renal calculi versus Vascular calcification Discussion of management or test interpretation with  external provider(s): Discussed with patient, CT is reassuring, she is feeling better after pain medication.  I did not replenish patient's potassium as she is a dialysis patient, to prove avoid hyperkalemia in the future.  I believe that her pain is likely musculoskeletal in origin, given that she has tenderness to palpation of that area, with range of motion, and CT abdomen/pelvis, was reassuring.  We discharged home to her home, with strict short dose of tramadol, and follow-up with PCP.  Return precautions emphasized.  She has no nausea, vomiting, vaginal symptoms, to suggest any kind of intra-abdominal etiology and does not make urine.   Risk Prescription drug management.   Final Clinical Impression(s) / ED Diagnoses Final diagnoses:  Right flank pain    Rx / DC Orders ED Discharge Orders          Ordered    traMADol (ULTRAM) 50 MG tablet  Every 12 hours PRN        12/02/22 1147              Nahiara Kretzschmar Elbert Ewings, [place] 12/02/22 1327    Gwyneth Sprout, MD 12/06/22 [phone]

## 2022-12-02 NOTE — Discharge Instructions (Addendum)
I believe that your pain is likely musculoskeletal related.  Your labs are reassuring, as well as her CAT scan was fairly unremarkable.  Please follow-up with your primary care doctor, for further evaluation.  I have sent you a short dose of pain medication, to help with your pain.  Return to the ER if you feel like your symptoms are worsening

## 2022-12-02 NOTE — ED Triage Notes (Signed)
Ongoing right flank pain x 1 month with nausea. Seen at [facility] with no diagnosis. Pt states she has missed dialysis some days due to worsening pain. HD pt MWF. Does not make urine.

## 2022-12-03 DIAGNOSIS — Z23 Encounter for immunization: Secondary | ICD-10-CM | POA: Diagnosis not present

## 2022-12-03 DIAGNOSIS — N186 End stage renal disease: Secondary | ICD-10-CM | POA: Diagnosis not present

## 2022-12-03 DIAGNOSIS — N2581 Secondary hyperparathyroidism of renal origin: Secondary | ICD-10-CM | POA: Diagnosis not present

## 2022-12-03 DIAGNOSIS — D509 Iron deficiency anemia, unspecified: Secondary | ICD-10-CM | POA: Diagnosis not present

## 2022-12-03 DIAGNOSIS — D631 Anemia in chronic kidney disease: Secondary | ICD-10-CM | POA: Diagnosis not present

## 2022-12-03 DIAGNOSIS — Z992 Dependence on renal dialysis: Secondary | ICD-10-CM | POA: Diagnosis not present

## 2022-12-05 DIAGNOSIS — N186 End stage renal disease: Secondary | ICD-10-CM | POA: Diagnosis not present

## 2022-12-05 DIAGNOSIS — Z23 Encounter for immunization: Secondary | ICD-10-CM | POA: Diagnosis not present

## 2022-12-05 DIAGNOSIS — N2581 Secondary hyperparathyroidism of renal origin: Secondary | ICD-10-CM | POA: Diagnosis not present

## 2022-12-05 DIAGNOSIS — Z992 Dependence on renal dialysis: Secondary | ICD-10-CM | POA: Diagnosis not present

## 2022-12-05 DIAGNOSIS — D631 Anemia in chronic kidney disease: Secondary | ICD-10-CM | POA: Diagnosis not present

## 2022-12-05 DIAGNOSIS — D509 Iron deficiency anemia, unspecified: Secondary | ICD-10-CM | POA: Diagnosis not present

## 2022-12-07 DIAGNOSIS — N186 End stage renal disease: Secondary | ICD-10-CM | POA: Diagnosis not present

## 2022-12-07 DIAGNOSIS — N2581 Secondary hyperparathyroidism of renal origin: Secondary | ICD-10-CM | POA: Diagnosis not present

## 2022-12-07 DIAGNOSIS — D631 Anemia in chronic kidney disease: Secondary | ICD-10-CM | POA: Diagnosis not present

## 2022-12-07 DIAGNOSIS — D509 Iron deficiency anemia, unspecified: Secondary | ICD-10-CM | POA: Diagnosis not present

## 2022-12-07 DIAGNOSIS — Z23 Encounter for immunization: Secondary | ICD-10-CM | POA: Diagnosis not present

## 2022-12-07 DIAGNOSIS — Z992 Dependence on renal dialysis: Secondary | ICD-10-CM | POA: Diagnosis not present

## 2022-12-10 DIAGNOSIS — N2581 Secondary hyperparathyroidism of renal origin: Secondary | ICD-10-CM | POA: Diagnosis not present

## 2022-12-10 DIAGNOSIS — Z992 Dependence on renal dialysis: Secondary | ICD-10-CM | POA: Diagnosis not present

## 2022-12-10 DIAGNOSIS — N186 End stage renal disease: Secondary | ICD-10-CM | POA: Diagnosis not present

## 2022-12-10 DIAGNOSIS — D631 Anemia in chronic kidney disease: Secondary | ICD-10-CM | POA: Diagnosis not present

## 2022-12-10 DIAGNOSIS — Z23 Encounter for immunization: Secondary | ICD-10-CM | POA: Diagnosis not present

## 2022-12-10 DIAGNOSIS — D509 Iron deficiency anemia, unspecified: Secondary | ICD-10-CM | POA: Diagnosis not present

## 2022-12-12 DIAGNOSIS — Z992 Dependence on renal dialysis: Secondary | ICD-10-CM | POA: Diagnosis not present

## 2022-12-12 DIAGNOSIS — N186 End stage renal disease: Secondary | ICD-10-CM | POA: Diagnosis not present

## 2022-12-12 DIAGNOSIS — D631 Anemia in chronic kidney disease: Secondary | ICD-10-CM | POA: Diagnosis not present

## 2022-12-12 DIAGNOSIS — N2581 Secondary hyperparathyroidism of renal origin: Secondary | ICD-10-CM | POA: Diagnosis not present

## 2022-12-12 DIAGNOSIS — D509 Iron deficiency anemia, unspecified: Secondary | ICD-10-CM | POA: Diagnosis not present

## 2022-12-12 DIAGNOSIS — Z23 Encounter for immunization: Secondary | ICD-10-CM | POA: Diagnosis not present

## 2022-12-14 DIAGNOSIS — D509 Iron deficiency anemia, unspecified: Secondary | ICD-10-CM | POA: Diagnosis not present

## 2022-12-14 DIAGNOSIS — Z23 Encounter for immunization: Secondary | ICD-10-CM | POA: Diagnosis not present

## 2022-12-14 DIAGNOSIS — N2581 Secondary hyperparathyroidism of renal origin: Secondary | ICD-10-CM | POA: Diagnosis not present

## 2022-12-14 DIAGNOSIS — Z992 Dependence on renal dialysis: Secondary | ICD-10-CM | POA: Diagnosis not present

## 2022-12-14 DIAGNOSIS — D631 Anemia in chronic kidney disease: Secondary | ICD-10-CM | POA: Diagnosis not present

## 2022-12-14 DIAGNOSIS — N186 End stage renal disease: Secondary | ICD-10-CM | POA: Diagnosis not present

## 2022-12-17 DIAGNOSIS — D509 Iron deficiency anemia, unspecified: Secondary | ICD-10-CM | POA: Diagnosis not present

## 2022-12-17 DIAGNOSIS — N2581 Secondary hyperparathyroidism of renal origin: Secondary | ICD-10-CM | POA: Diagnosis not present

## 2022-12-17 DIAGNOSIS — N186 End stage renal disease: Secondary | ICD-10-CM | POA: Diagnosis not present

## 2022-12-17 DIAGNOSIS — Z23 Encounter for immunization: Secondary | ICD-10-CM | POA: Diagnosis not present

## 2022-12-17 DIAGNOSIS — D631 Anemia in chronic kidney disease: Secondary | ICD-10-CM | POA: Diagnosis not present

## 2022-12-17 DIAGNOSIS — Z992 Dependence on renal dialysis: Secondary | ICD-10-CM | POA: Diagnosis not present

## 2022-12-19 DIAGNOSIS — Z992 Dependence on renal dialysis: Secondary | ICD-10-CM | POA: Diagnosis not present

## 2022-12-19 DIAGNOSIS — Z23 Encounter for immunization: Secondary | ICD-10-CM | POA: Diagnosis not present

## 2022-12-19 DIAGNOSIS — N186 End stage renal disease: Secondary | ICD-10-CM | POA: Diagnosis not present

## 2022-12-19 DIAGNOSIS — D631 Anemia in chronic kidney disease: Secondary | ICD-10-CM | POA: Diagnosis not present

## 2022-12-19 DIAGNOSIS — N2581 Secondary hyperparathyroidism of renal origin: Secondary | ICD-10-CM | POA: Diagnosis not present

## 2022-12-19 DIAGNOSIS — D509 Iron deficiency anemia, unspecified: Secondary | ICD-10-CM | POA: Diagnosis not present

## 2022-12-21 DIAGNOSIS — D631 Anemia in chronic kidney disease: Secondary | ICD-10-CM | POA: Diagnosis not present

## 2022-12-21 DIAGNOSIS — D509 Iron deficiency anemia, unspecified: Secondary | ICD-10-CM | POA: Diagnosis not present

## 2022-12-21 DIAGNOSIS — Z992 Dependence on renal dialysis: Secondary | ICD-10-CM | POA: Diagnosis not present

## 2022-12-21 DIAGNOSIS — Z23 Encounter for immunization: Secondary | ICD-10-CM | POA: Diagnosis not present

## 2022-12-21 DIAGNOSIS — N2581 Secondary hyperparathyroidism of renal origin: Secondary | ICD-10-CM | POA: Diagnosis not present

## 2022-12-21 DIAGNOSIS — N186 End stage renal disease: Secondary | ICD-10-CM | POA: Diagnosis not present

## 2022-12-24 ENCOUNTER — Encounter (HOSPITAL_COMMUNITY): Payer: Self-pay | Admitting: Emergency Medicine

## 2022-12-24 ENCOUNTER — Other Ambulatory Visit: Payer: Self-pay

## 2022-12-24 ENCOUNTER — Emergency Department (HOSPITAL_COMMUNITY)
Admission: EM | Admit: 2022-12-24 | Discharge: 2022-12-24 | Disposition: A | Discharge status: Home / Self Care | Payer: Medicare Other | Attending: Emergency Medicine | Admitting: Emergency Medicine

## 2022-12-24 DIAGNOSIS — T82590A Other mechanical complication of surgically created arteriovenous fistula, initial encounter: Secondary | ICD-10-CM | POA: Insufficient documentation

## 2022-12-24 DIAGNOSIS — Y712 Prosthetic and other implants, materials and accessory cardiovascular devices associated with adverse incidents: Secondary | ICD-10-CM | POA: Insufficient documentation

## 2022-12-24 DIAGNOSIS — N2581 Secondary hyperparathyroidism of renal origin: Secondary | ICD-10-CM | POA: Diagnosis not present

## 2022-12-24 DIAGNOSIS — D631 Anemia in chronic kidney disease: Secondary | ICD-10-CM | POA: Diagnosis not present

## 2022-12-24 DIAGNOSIS — Z7902 Long term (current) use of antithrombotics/antiplatelets: Secondary | ICD-10-CM | POA: Insufficient documentation

## 2022-12-24 DIAGNOSIS — M549 Dorsalgia, unspecified: Secondary | ICD-10-CM | POA: Diagnosis not present

## 2022-12-24 DIAGNOSIS — N186 End stage renal disease: Secondary | ICD-10-CM | POA: Diagnosis not present

## 2022-12-24 DIAGNOSIS — T829XXA Unspecified complication of cardiac and vascular prosthetic device, implant and graft, initial encounter: Secondary | ICD-10-CM

## 2022-12-24 DIAGNOSIS — D509 Iron deficiency anemia, unspecified: Secondary | ICD-10-CM | POA: Diagnosis not present

## 2022-12-24 DIAGNOSIS — T82838A Hemorrhage of vascular prosthetic devices, implants and grafts, initial encounter: Secondary | ICD-10-CM | POA: Diagnosis present

## 2022-12-24 DIAGNOSIS — Z992 Dependence on renal dialysis: Secondary | ICD-10-CM | POA: Diagnosis not present

## 2022-12-24 DIAGNOSIS — Z23 Encounter for immunization: Secondary | ICD-10-CM | POA: Diagnosis not present

## 2022-12-24 LAB — CBC WITH DIFFERENTIAL/PLATELET
Abs Immature Granulocytes: 0.01 10*3/uL (ref 0.00–0.07)
Basophils Absolute: 0 10*3/uL (ref 0.0–0.1)
Basophils Relative: 1 %
Eosinophils Absolute: 0.2 10*3/uL (ref 0.0–0.5)
Eosinophils Relative: 4 %
HCT: 29.5 % — ABNORMAL LOW (ref 36.0–46.0)
Hemoglobin: 9.4 g/dL — ABNORMAL LOW (ref 12.0–15.0)
Immature Granulocytes: 0 %
Lymphocytes Relative: 28 %
Lymphs Abs: 1.2 10*3/uL (ref 0.7–4.0)
MCH: 30.6 pg (ref 26.0–34.0)
MCHC: 31.9 g/dL (ref 30.0–36.0)
MCV: 96.1 fL (ref 80.0–100.0)
Monocytes Absolute: 0.4 10*3/uL (ref 0.1–1.0)
Monocytes Relative: 9 %
Neutro Abs: 2.6 10*3/uL (ref 1.7–7.7)
Neutrophils Relative %: 58 %
Platelets: 130 10*3/uL — ABNORMAL LOW (ref 150–400)
RBC: 3.07 MIL/uL — ABNORMAL LOW (ref 3.87–5.11)
RDW: 17.1 % — ABNORMAL HIGH (ref 11.5–15.5)
WBC: 4.4 10*3/uL (ref 4.0–10.5)
nRBC: 0 % (ref 0.0–0.2)

## 2022-12-24 LAB — BASIC METABOLIC PANEL
Anion gap: 12 (ref 5–15)
BUN: 15 mg/dL (ref 8–23)
CO2: 23 mmol/L (ref 22–32)
Calcium: 8.3 mg/dL — ABNORMAL LOW (ref 8.9–10.3)
Chloride: 100 mmol/L (ref 98–111)
Creatinine, Ser: 6.8 mg/dL — ABNORMAL HIGH (ref 0.44–1.00)
GFR, Estimated: 6 mL/min — ABNORMAL LOW (ref >60)
Glucose, Bld: 87 mg/dL (ref 70–99)
Potassium: 3.9 mmol/L (ref 3.5–5.1)
Sodium: 135 mmol/L (ref 135–145)

## 2022-12-24 NOTE — ED Provider Notes (Signed)
[place] EMERGENCY DEPARTMENT AT [facility] Provider Note   CSN: [id number] Arrival date & time: 12/24/22  1125     History  No chief complaint on file.   [name] Marks is a 67 y.o. female.  HPI   67 year old female with end-stage renal disease on dialysis every MWF presents emergency department with bleeding from her left thigh fistula.  She reportedly shifted during her session this morning and had bleeding from both port sites.  Comes in with pressure dressing and.  Complaining of mild weakness but otherwise has been in her usual state of health.  Home Medications Prior to Admission medications   Medication Sig Start Date End Date Taking? Authorizing Provider  cinacalcet (SENSIPAR) 90 MG tablet Take 90 mg by mouth at bedtime.    [provider]  clopidogrel (PLAVIX) 75 MG tablet Take 1 tablet (75 mg total) by mouth daily. 05/15/22   Sharlene Dory, PA-C  famotidine (PEPCID) 20 MG tablet Take 20 mg by mouth daily after supper.    [provider]  midodrine (PROAMATINE) 10 MG tablet Take 10 mg by mouth See admin instructions. Take 1 tablet (10 mg) by mouth before dialysis & take 1 tablet (10 mg) while at dialysis on Mondays, Wednesdays & Fridays. 08/15/20   [provider]  Multiple Vitamins-Minerals (PRORENAL QD PO) Take 1 tablet by mouth at bedtime.     [provider]  ondansetron (ZOFRAN-ODT) 4 MG disintegrating tablet Take 1 tablet (4 mg total) by mouth every 8 (eight) hours as needed for nausea or vomiting. 10/31/22   Valinda Hoar, NP  sevelamer carbonate (RENVELA) 800 MG tablet Take 2,400 mg by mouth See admin instructions. Take 3 tablets (2400 mg) by mouth after each meal and after each snack    [provider]  traMADol (ULTRAM) 50 MG tablet Take 1 tablet (50 mg total) by mouth every 12 (twelve) hours as needed. 12/02/22   Small, Brooke L, PA      Allergies    Iodinated contrast media, Cefazolin, and Naproxen  sodium    Review of Systems   Review of Systems  Constitutional:  Negative for fever.  Respiratory:  Negative for chest tightness and shortness of breath.   Cardiovascular:  Negative for chest pain.  Gastrointestinal:  Negative for abdominal pain.  Musculoskeletal:        Bleeding from left thigh AV fistula    Physical Exam Updated Vital Signs BP (!) 106/51   Pulse 69   Temp 97.7 F (36.5 C) (Oral)   Resp 16   Ht 4\' 11"  (1.499 m)   Wt 69 kg   SpO2 100%   BMI 30.72 kg/m  Physical Exam Vitals and nursing note reviewed.  Constitutional:      General: She is in acute distress.     Appearance: Normal appearance.  HENT:     Head: Normocephalic.     Mouth/Throat:     Mouth: Mucous membranes are moist.  Cardiovascular:     Rate and Rhythm: Normal rate.     Comments: Pressure dressing to the left thigh AV fistula with some bleeding around the pressure dressing Pulmonary:     Effort: Pulmonary effort is normal. No respiratory distress.  Abdominal:     Palpations: Abdomen is soft.     Tenderness: There is no abdominal tenderness.  Skin:    General: Skin is warm.  Neurological:     Mental Status: She is alert and oriented to  person, place, and time. Mental status is at baseline.  Psychiatric:        Mood and Affect: Mood normal.     ED Results / Procedures / Treatments   Labs (all labs ordered are listed, but only abnormal results are displayed) Labs Reviewed  CBC WITH DIFFERENTIAL/PLATELET - Abnormal; Notable for the following components:      Result Value   RBC 3.07 (*)    Hemoglobin 9.4 (*)    HCT 29.5 (*)    RDW 17.1 (*)    Platelets 130 (*)    All other components within normal limits  BASIC METABOLIC PANEL - Abnormal; Notable for the following components:   Creatinine, Ser 6.80 (*)    Calcium 8.3 (*)    GFR, Estimated 6 (*)    All other components within normal limits    EKG None  Radiology No results found.  Procedures .Marland KitchenLaceration  Repair  Date/Time: 12/24/2022 3:41 PM  Performed by: Rozelle Logan, DO Authorized by: Rozelle Logan, DO   Consent:    Consent obtained:  Verbal   Consent given by:  Patient   Risks discussed:  Infection, pain, poor wound healing and vascular damage Universal protocol:    Patient identity confirmed:  Verbally with patient Anesthesia:    Anesthesia method:  None Laceration details:    Location:  Leg   Leg location:  L upper leg   Length (cm):  1 Skin repair:    Repair method:  Sutures   Suture size:  4-0   Suture material:  Prolene   Suture technique:  Simple interrupted   Number of sutures:  2 Approximation:    Approximation:  Close Repair type:    Repair type:  Complex Post-procedure details:    Dressing:  Bulky dressing   Procedure completion:  Tolerated Comments:     2 pursestring sutures placed at bleeding AV fistula site.  Pressure dressing placed, bleeding controlled. .Critical Care  Performed by: Rozelle Logan, DO Authorized by: Rozelle Logan, DO   Critical care provider statement:    Critical care time (minutes):  30   Critical care time was exclusive of:  Separately billable procedures and treating other patients   Critical care was necessary to treat or prevent imminent or life-threatening deterioration of the following conditions:  Circulatory failure   Critical care was time spent personally by me on the following activities:  Development of treatment plan with patient or surrogate, evaluation of patient's response to treatment, examination of patient, ordering and review of laboratory studies, ordering and performing treatments and interventions, pulse oximetry, re-evaluation of patient's condition and review of old charts   I assumed direction of critical care for this patient from another provider in my specialty: no   Comments:     Bleeding AV fistula, profuse bleeding requiring emergent suturing     Medications Ordered in ED Medications  - No data to display  ED Course/ Medical Decision Making/ A&P                                 Medical Decision Making Amount and/or Complexity of Data Reviewed Labs: ordered.   67 year old female presents emergency department bleeding from her left thigh AV fistula.  This happened during her dialysis session this morning.  Pressure dressing is applied but she has bleeding around it from the lower site.  Complaining of some mild weakness.  Dressing was removed, patient had profuse bleeding from 1 access site.  Pressure was applied and 2 pursestring sutures are required to control the bleeding.  Patient tolerated well.  Blood work shows no acute anemia, no acute electrolyte abnormality or change in kidney function.  Notified on-call nephrologist and they recommend that she follows up at dialysis clinic for suture removal and reevaluation.  Patient at this time appears safe and stable for discharge and close outpatient follow up. Discharge plan and strict return to ED precautions discussed, patient verbalizes understanding and agreement.        Final Clinical Impression(s) / ED Diagnoses Final diagnoses:  Complication of AV dialysis fistula, initial encounter    Rx / DC Orders ED Discharge Orders     None         Rozelle Logan, DO 12/24/22 1544

## 2022-12-24 NOTE — ED Notes (Signed)
Phlebotomy to obtain labs. 

## 2022-12-24 NOTE — ED Notes (Signed)
Pt bleeding on left upper thigh controlled at this time by gauze and pressure.

## 2022-12-24 NOTE — ED Notes (Signed)
Phlebotomy at bedside.

## 2022-12-24 NOTE — ED Triage Notes (Signed)
Pt BIB [facility] from dialysis center due to intermittent bleeding from left thigh fistula. Pt hx of chronic back pain.  Pt tried to readjust herself while getting dialysis and pulled out the tubing at 0640 this morning.  Dialysis center states that they barely had begun treatment when this happened.  Pt does have old fistulas bilateral arms.   VS BP 136/74, HR 70, Resp 18, SpO2 98%

## 2022-12-24 NOTE — Discharge Instructions (Signed)
You have been seen and discharged from the emergency department.  You had 2 sutures placed in your left thigh AV fistula to stop bleeding.  Follow-up with dialysis clinic for removal of the sutures and further evaluation.  Follow-up with your primary provider for further evaluation and further care. Take home medications as prescribed. If you have any worsening symptoms or further concerns for your health please return to an emergency department for further evaluation.

## 2022-12-25 DIAGNOSIS — M546 Pain in thoracic spine: Secondary | ICD-10-CM | POA: Diagnosis not present

## 2022-12-25 DIAGNOSIS — M5451 Vertebrogenic low back pain: Secondary | ICD-10-CM | POA: Diagnosis not present

## 2022-12-26 DIAGNOSIS — N186 End stage renal disease: Secondary | ICD-10-CM | POA: Diagnosis not present

## 2022-12-26 DIAGNOSIS — D509 Iron deficiency anemia, unspecified: Secondary | ICD-10-CM | POA: Diagnosis not present

## 2022-12-26 DIAGNOSIS — Z23 Encounter for immunization: Secondary | ICD-10-CM | POA: Diagnosis not present

## 2022-12-26 DIAGNOSIS — Z992 Dependence on renal dialysis: Secondary | ICD-10-CM | POA: Diagnosis not present

## 2022-12-26 DIAGNOSIS — D631 Anemia in chronic kidney disease: Secondary | ICD-10-CM | POA: Diagnosis not present

## 2022-12-26 DIAGNOSIS — N2581 Secondary hyperparathyroidism of renal origin: Secondary | ICD-10-CM | POA: Diagnosis not present

## 2022-12-27 ENCOUNTER — Ambulatory Visit (HOSPITAL_COMMUNITY)
Admission: RE | Admit: 2022-12-27 | Discharge: 2022-12-27 | Disposition: A | Discharge status: Home / Self Care | Payer: Medicare Other | Source: Ambulatory Visit | Attending: Cardiology | Admitting: Cardiology

## 2022-12-27 VITALS — BP 99/79 | HR 62 | Wt 148.0 lb

## 2022-12-27 DIAGNOSIS — N186 End stage renal disease: Secondary | ICD-10-CM | POA: Diagnosis not present

## 2022-12-27 DIAGNOSIS — I082 Rheumatic disorders of both aortic and tricuspid valves: Secondary | ICD-10-CM | POA: Diagnosis not present

## 2022-12-27 DIAGNOSIS — Z8673 Personal history of transient ischemic attack (TIA), and cerebral infarction without residual deficits: Secondary | ICD-10-CM | POA: Insufficient documentation

## 2022-12-27 DIAGNOSIS — I272 Pulmonary hypertension, unspecified: Secondary | ICD-10-CM | POA: Diagnosis not present

## 2022-12-27 DIAGNOSIS — I429 Cardiomyopathy, unspecified: Secondary | ICD-10-CM | POA: Insufficient documentation

## 2022-12-27 DIAGNOSIS — I509 Heart failure, unspecified: Secondary | ICD-10-CM | POA: Insufficient documentation

## 2022-12-27 NOTE — Progress Notes (Signed)
ADVANCED HEART FAILURE NEW PATIENT CLINIC NOTE  Referring Physician: Quintella Reichert, MD  Primary Care: Patient, No Pcp Per Primary Cardiologist:  HPI: [name] Marks is a 67 y.o. 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 This was complicated by severe aortic insufficiency. Underwent cardiac catheterization showing normal coronary arteries and then underwent bioprosthetic AVR in 2017. She had a TIA in 2018 subsequently and had a ILR implanted. Notably, she had severe TR in echocardiograms as far back as 2017 and 2018. Her aortic valve has been functioning normally during that time.  During this time she was seen at [facility] to undergo transplantation workup. She had a RHC in 2022 that has results below. After an echo in 2024 showed similar torrential TR she was arranged to have an exercise treadmill test during which she could only walk for 3.6 minutes before having to stop. She was deemed a poor transplant candidate at [facility] at that time. Structural team at [facility] and [facility] have not considered for percutaneous repair given her ESRD.       SUBJECTIVE: Patient reports that overall she is doing well. She has no issues tolerating HD. She is able to do things around the house, though gets more short of breath then she used to. She denies any issues with handling fluid. She has been on HD for the last 24 years and is currently using a L thigh fistula for the last 7.   PMH, current medications, allergies, social history, and family history reviewed in epic.  PHYSICAL EXAM: Vitals:   12/27/22 1039  BP: 99/79  Pulse: 62  SpO2: 99%   GENERAL: NAD CHEST: CTAB, normal WOB. CARDIAC:  JVP: Mildly elevated with significant V waves          Normal rate with  regular rhythm. Systolic murmur.  Pulses are 2+ and symmetrical in upper and lower extremities. Trace edema.  ABDOMEN: Soft, non-tender, non-distended. Marland Kitchen  EXTREMITIES: Warm and well perfused with no cyanosis, clubbing. She has swelling of the upper extremities with multiple previous  NEUROLOGIC: Patient is oriented x3 with no focal or lateralizing neurologic deficits.  PSYCH: Patients affect is appropriate, there is no evidence of anxiety or depression.  SKIN: Warm and dry; no lesions or wounds.   DATA REVIEW  ECG: 10/2021: Sinus arrhythmia with PVCs  ECHO: 08/2022: EF 55-60%, Grade II DD, severely enlarged RV with torrential TR as evidenced by early cut off sign and hepatic systolic flow reversal.  CATH: 10/12/2019: Left internal jugular access, RA 10 with v wave to 22, PA 30/10 (17), PCWP 11, mixed venous sat 69%, Ao sat 98%, Fick CI 3.1L/min/m2  Non obstructive coronaries noted prior to valve surgery    ASSESSMENT & PLAN:  Severe Tricuspid regurgitation: Noted on echocardiogram back in 2017, has gotten progressively worse. She is not a surgical candidate and also a poor percutaneous repair candidate as well. Etiology unclear, her PA pressures back in 2021 do not suggest a high flow state, but given she has been on HD since 2000 it likely is contributing. Regardless, she is not overtly symptomatic, and she would prefer to not pursue additional workup and procedures, especially if there is no guarantee of her becoming a txp candidate. She has done well on HD and  accepts that she is almost 43 and has lived 24 years on HD. Will arrange for 6 month follow up to revisit. - 6 month follow up - No indication for repeat RHC after discussion over patient wishes   Clearnce Hasten, MD Advanced Heart Failure Mechanical Circulatory Support 12/27/22

## 2022-12-28 DIAGNOSIS — N186 End stage renal disease: Secondary | ICD-10-CM | POA: Diagnosis not present

## 2022-12-28 DIAGNOSIS — N2581 Secondary hyperparathyroidism of renal origin: Secondary | ICD-10-CM | POA: Diagnosis not present

## 2022-12-28 DIAGNOSIS — N039 Chronic nephritic syndrome with unspecified morphologic changes: Secondary | ICD-10-CM | POA: Diagnosis not present

## 2022-12-28 DIAGNOSIS — D509 Iron deficiency anemia, unspecified: Secondary | ICD-10-CM | POA: Diagnosis not present

## 2022-12-28 DIAGNOSIS — D631 Anemia in chronic kidney disease: Secondary | ICD-10-CM | POA: Diagnosis not present

## 2022-12-28 DIAGNOSIS — Z992 Dependence on renal dialysis: Secondary | ICD-10-CM | POA: Diagnosis not present

## 2022-12-31 DIAGNOSIS — D509 Iron deficiency anemia, unspecified: Secondary | ICD-10-CM | POA: Diagnosis not present

## 2022-12-31 DIAGNOSIS — N186 End stage renal disease: Secondary | ICD-10-CM | POA: Diagnosis not present

## 2022-12-31 DIAGNOSIS — N2581 Secondary hyperparathyroidism of renal origin: Secondary | ICD-10-CM | POA: Diagnosis not present

## 2022-12-31 DIAGNOSIS — Z992 Dependence on renal dialysis: Secondary | ICD-10-CM | POA: Diagnosis not present

## 2022-12-31 DIAGNOSIS — D631 Anemia in chronic kidney disease: Secondary | ICD-10-CM | POA: Diagnosis not present

## 2023-01-02 DIAGNOSIS — N186 End stage renal disease: Secondary | ICD-10-CM | POA: Diagnosis not present

## 2023-01-02 DIAGNOSIS — D631 Anemia in chronic kidney disease: Secondary | ICD-10-CM | POA: Diagnosis not present

## 2023-01-02 DIAGNOSIS — Z992 Dependence on renal dialysis: Secondary | ICD-10-CM | POA: Diagnosis not present

## 2023-01-02 DIAGNOSIS — D509 Iron deficiency anemia, unspecified: Secondary | ICD-10-CM | POA: Diagnosis not present

## 2023-01-02 DIAGNOSIS — N2581 Secondary hyperparathyroidism of renal origin: Secondary | ICD-10-CM | POA: Diagnosis not present

## 2023-01-03 ENCOUNTER — Encounter: Payer: Self-pay | Admitting: Cardiology

## 2023-01-03 ENCOUNTER — Ambulatory Visit: Payer: Medicare Other | Attending: Cardiology | Admitting: Cardiology

## 2023-01-03 VITALS — BP 113/69 | HR 69 | Ht 59.0 in | Wt 147.0 lb

## 2023-01-03 DIAGNOSIS — Z8679 Personal history of other diseases of the circulatory system: Secondary | ICD-10-CM | POA: Diagnosis not present

## 2023-01-03 DIAGNOSIS — Z952 Presence of prosthetic heart valve: Secondary | ICD-10-CM | POA: Diagnosis not present

## 2023-01-03 DIAGNOSIS — I341 Nonrheumatic mitral (valve) prolapse: Secondary | ICD-10-CM | POA: Diagnosis not present

## 2023-01-03 DIAGNOSIS — I071 Rheumatic tricuspid insufficiency: Secondary | ICD-10-CM | POA: Insufficient documentation

## 2023-01-03 DIAGNOSIS — I6529 Occlusion and stenosis of unspecified carotid artery: Secondary | ICD-10-CM | POA: Insufficient documentation

## 2023-01-03 DIAGNOSIS — I272 Pulmonary hypertension, unspecified: Secondary | ICD-10-CM | POA: Diagnosis not present

## 2023-01-03 NOTE — Progress Notes (Addendum)
Date:  08/24/2019   ID:  [name] Marks, DOB November 26, 1955, MRN [id number]  Cardiology Office Note:    Date:  01/03/2023   ID:  [name] Marks, DOB [birth year], MRN [id number]  PCP:  Patient, No Pcp Per  Cardiologist:  Armanda Magic, MD    Referring MD: Program, [place] [facility]*   Chief Complaint  Patient presents with   Follow-up    Aortic valve endocarditis, status post AVR, severe TR, end-stage renal disease, pulmonary hypertension    History of Present Illness:    [name] Marks is a 67 y.o. 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  This was complicated by severe aortic insufficiency.  She underwent cardiac cath 2017 showing normal coronary arteries and underwent bioprosthetic AVR.  Unfortunately she subsequently had a TIA in March 2018.  She has known moderate TR and moderate pulmonary hypertension with last PASP 47 mmHg 02/29/2016.    She is here today for followup and is doing well.  She has been having some CP that is sporadic that lasts about 5-10 minutes and goes away.  There are no associated sx with it.  It is nonexertional.  She denies any SOB, DOE, PND, orthopnea, LE edema, palpitations, dizziness or syncope. She is compliant with her meds and is tolerating meds with no SE.  Marland Kitchen     Past Medical History:  Diagnosis Date   Anemia    Aortic valve prosthesis present 02/25/2016   Arthritis    "knees" (01/03/2017)   AVD (aortic valve disease) 07/12/2016   Backache 12/06/2008   Bacteremia due to coagulase-negative Staphylococcus    Carpal tunnel syndrome    Cerebral embolism with transient ischemic attack (TIA)    Cholelithiases 01/28/2017   Chronic female pelvic pain 08/08/2012   Complication of anesthesia    01/01/17- '"a long time ago, difficulty breathimg, not sure if it was due to anesthesia or not."   CVA (cerebral vascular accident) (HCC) 05/16/2016   End  stage renal disease on dialysis (HCC)    "MWF; [address] (01/03/2017)   Endocarditis    Esophageal reflux 12/06/2008   GERD (gastroesophageal reflux disease)    Gout    History of blood transfusion 2017   "related to blood poison" (01/03/2017)   Increased endometrial stripe thickness 11/03/2015   Neck pain    Osteoporosis 09/2016   T score -2.6   Pain and swelling of right upper extremity 12/20/2015   Pulmonary HTN (HCC) 07/12/2016   Renal dialysis device, implant, or graft complication 12/20/2015   Renal insufficiency    S/P cholecystectomy 02/14/2017   Septic shock (HCC)    Staphylococcus aureus bacteremia    TIA (transient ischemic attack)    "several" (01/03/2017)    Past Surgical History:  Procedure Laterality Date   AORTIC VALVE REPLACEMENT N/A 02/25/2016   Procedure: AORTIC VALVE REPLACEMENT (AVR) implanted with Magna Ease Aortic valve size 21mm;  Surgeon: Loreli Slot, MD;  Location: [facility] OR;  Service: Open Heart Surgery;  Laterality: N/A;   AV FISTULA PLACEMENT Left 01/03/2017   Procedure: INSERTION OF ARTERIOVENOUS (AV) GORE-TEX GRAFT LEFT THIGH;  Surgeon: Nada Libman, MD;  Location: [facility] OR;  Service: Vascular;  Laterality: Left;   AVGG REMOVAL Right 02/17/2016   Procedure: REMOVAL OF TWO ARTERIOVENOUS GORETEX GRAFTS (AVGG);  Surgeon: Chuck Hint, MD;  Location: [facility] OR;  Service: Vascular;  Laterality:  Right;   BIOPSY  05/16/2021   Procedure: BIOPSY;  Surgeon: Charlott Rakes, MD;  Location: [facility] ENDOSCOPY;  Service: Endoscopy;;   BREAST BIOPSY Left 2013   stereo    BREAST BIOPSY Right 2011   stereo    CARDIAC VALVE REPLACEMENT     CHOLECYSTECTOMY N/A 01/30/2017   Procedure: LAPAROSCOPIC CHOLECYSTECTOMY;  Surgeon: Harriette Bouillon, MD;  Location: [facility] OR;  Service: General;  Laterality: N/A;   COLONOSCOPY W/ POLYPECTOMY     COLONOSCOPY WITH PROPOFOL N/A 05/16/2021   Procedure: COLONOSCOPY WITH PROPOFOL;  Surgeon: Charlott Rakes, MD;  Location: [facility]  ENDOSCOPY;  Service: Endoscopy;  Laterality: N/A;   DG AV DIALYSIS GRAFT DECLOT OR     DILATATION & CURETTAGE/HYSTEROSCOPY WITH TRUECLEAR N/A 11/06/2012   Procedure: DILATATION & CURETTAGE/HYSTEROSCOPY WITH TRUECLEAR;  Surgeon: Ok Edwards, MD;  Location: [facility] ORS;  Service: Gynecology;  Laterality: N/A;  Truclear Resectoscopic Polypectomy    INSERTION OF DIALYSIS CATHETER N/A 02/19/2016   Procedure: INSERTION OF Left Internal Jugular DIALYSIS CATHETER;  Surgeon: Chuck Hint, MD;  Location: [facility] OR;  Service: Vascular;  Laterality: N/A;   LOOP RECORDER INSERTION N/A 10/01/2016   Procedure: LOOP RECORDER INSERTION;  Surgeon: Regan Lemming, MD;  Location: [facility] INVASIVE CV LAB;  Service: Cardiovascular;  Laterality: N/A;   PATCH ANGIOPLASTY Right 02/17/2016   Procedure: PATCH ANGIOPLASTY;  Surgeon: Chuck Hint, MD;  Location: [facility] OR;  Service: Vascular;  Laterality: Right;   PERIPHERAL VASCULAR CATHETERIZATION N/A 09/14/2014   Procedure: A/V Shuntogram/Fistulagram;  Surgeon: Renford Dills, MD;  Location: [facility] INVASIVE CV LAB;  Service: Cardiovascular;  Laterality: N/A;   PERIPHERAL VASCULAR CATHETERIZATION N/A 09/14/2014   Procedure: A/V Shunt Intervention;  Surgeon: Renford Dills, MD;  Location: [facility] INVASIVE CV LAB;  Service: Cardiovascular;  Laterality: N/A;   PERIPHERAL VASCULAR CATHETERIZATION Right 12/09/2014   Procedure: A/V Shuntogram/Fistulagram;  Surgeon: Annice Needy, MD;  Location: [facility] INVASIVE CV LAB;  Service: Cardiovascular;  Laterality: Right;   PERIPHERAL VASCULAR CATHETERIZATION N/A 12/09/2014   Procedure: A/V Shunt Intervention;  Surgeon: Annice Needy, MD;  Location: [facility] INVASIVE CV LAB;  Service: Cardiovascular;  Laterality: N/A;   PERIPHERAL VASCULAR CATHETERIZATION Right 05/24/2015   Procedure: A/V Shuntogram;  Surgeon: Nada Libman, MD;  Location: [facility] INVASIVE CV LAB;  Service: Cardiovascular;  Laterality: Right;   PERIPHERAL VASCULAR  CATHETERIZATION Right 05/24/2015   Procedure: Peripheral Vascular Balloon Angioplasty;  Surgeon: Nada Libman, MD;  Location: [facility] INVASIVE CV LAB;  Service: Cardiovascular;  Laterality: Right;  right arm shunt   PERIPHERAL VASCULAR CATHETERIZATION N/A 06/13/2015   Procedure: A/V Shuntogram/Fistulagram;  Surgeon: Annice Needy, MD;  Location: [facility] INVASIVE CV LAB;  Service: Cardiovascular;  Laterality: N/A;   PERIPHERAL VASCULAR CATHETERIZATION N/A 06/13/2015   Procedure: A/V Shunt Intervention;  Surgeon: Annice Needy, MD;  Location: [facility] INVASIVE CV LAB;  Service: Cardiovascular;  Laterality: N/A;   TEE WITHOUT CARDIOVERSION N/A 02/22/2016   Procedure: TRANSESOPHAGEAL ECHOCARDIOGRAM (TEE);  Surgeon: Chrystie Nose, MD;  Location: [facility] ENDOSCOPY;  Service: Cardiovascular;  Laterality: N/A;   TEE WITHOUT CARDIOVERSION N/A 02/25/2016   Procedure: TRANSESOPHAGEAL ECHOCARDIOGRAM (TEE);  Surgeon: Loreli Slot, MD;  Location: [facility] OR;  Service: Open Heart Surgery;  Laterality: N/A;   TEE WITHOUT CARDIOVERSION N/A 10/01/2016   Procedure: TRANSESOPHAGEAL ECHOCARDIOGRAM (TEE);  Surgeon: Chrystie Nose, MD;  Location: [facility] ENDOSCOPY;  Service: Cardiovascular;  Laterality: N/A;   TUBAL LIGATION  1983    Current Medications:  Current Meds  Medication Sig   cinacalcet (SENSIPAR) 90 MG tablet Take 90 mg by mouth at bedtime.   clopidogrel (PLAVIX) 75 MG tablet Take 1 tablet (75 mg total) by mouth daily.   famotidine (PEPCID) 20 MG tablet Take 20 mg by mouth daily after supper.   midodrine (PROAMATINE) 10 MG tablet Take 10 mg by mouth See admin instructions. Take 1 tablet (10 mg) by mouth before dialysis & take 1 tablet (10 mg) while at dialysis on Mondays, Wednesdays & Fridays.   ondansetron (ZOFRAN-ODT) 4 MG disintegrating tablet Take 1 tablet (4 mg total) by mouth every 8 (eight) hours as needed for nausea or vomiting.   sevelamer carbonate (RENVELA) 800 MG tablet Take 2,400 mg by mouth See admin  instructions. Take 3 tablets (2400 mg) by mouth after each meal and after each snack   traMADol (ULTRAM) 50 MG tablet Take 1 tablet (50 mg total) by mouth every 12 (twelve) hours as needed.     Allergies:   Iodinated contrast media, Cefazolin, and Naproxen sodium   Social History   Socioeconomic History   Marital status: Divorced    Spouse name: Not on file   Number of children: Not on file   Years of education: Not on file   Highest education level: Not on file  Occupational History   Not on file  Tobacco Use   Smoking status: Never   Smokeless tobacco: Never  Vaping Use   Vaping status: Never Used  Substance and Sexual Activity   Alcohol use: No   Drug use: No   Sexual activity: Not Currently    Birth control/protection: Post-menopausal    Comment: 1st intercourse 67 yo-Fewer than 5 partners  Other Topics Concern   Not on file  Social History Narrative   Uses cane at home.   Social Determinants of Health   Financial Resource Strain: Not on file  Food Insecurity: Not on file  Transportation Needs: Not on file  Physical Activity: Not on file  Stress: Not on file  Social Connections: Not on file     Family History: The patient's family history includes Alcohol abuse in her father and mother; Breast cancer in her sister; Breast cancer (age of onset: 23) in her sister; Cancer in her father; Diabetes in her mother; Heart disease in her mother; Hypertension in her mother, sister, and son; Kidney disease in her mother, sister, and son.  ROS:   Please see the history of present illness.    ROS  All other systems reviewed and negative.   EKGs/Labs/Other Studies Reviewed:    The following studies were reviewed today: 2D echo 08/2019   Recent Labs: 12/02/2022: ALT 15 12/24/2022: BUN 15; Creatinine, Ser 6.80; Hemoglobin 9.4; Platelets 130; Potassium 3.9; Sodium 135   Recent Lipid Panel    Component Value Date/Time   CHOL 147 09/29/2016 0531   TRIG 83 09/29/2016 0531    HDL 66 09/29/2016 0531   CHOLHDL 2.2 09/29/2016 0531   VLDL 17 09/29/2016 0531   LDLCALC 64 09/29/2016 0531    Physical Exam:    VS:  BP (!) 86/50   Pulse 69   Ht 4\' 11"  (1.499 m)   Wt 147 lb (66.7 kg)   SpO2 99%   BMI 29.69 kg/m     Wt Readings from Last 3 Encounters:  01/03/23 147 lb (66.7 kg)  12/27/22 148 lb (67.1 kg)  12/24/22 152 lb 1.9 oz (69 kg)  GEN: Well nourished, well developed in  no acute distress HEENT: Normal NECK: No JVD; bilateral carotid artery bruits LYMPHATICS: No lymphadenopathy CARDIAC:RRR, no  rubs, gallops 1/6 SM at RUSB RESPIRATORY:  Clear to auscultation without rales, wheezing or rhonchi  ABDOMEN: Soft, non-tender, non-distended MUSCULOSKELETAL:  No edema; No deformity  SKIN: Warm and dry NEUROLOGIC:  Alert and oriented x 3 PSYCHIATRIC:  Normal affect  ASSESSMENT:PLAN:    In order of problems listed above:  1.  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  Pulmonary hypertension/severe RVE -this resolved on echo 01/2017 with PASP 33 mmHg and normal on echo 08/2019 -Very mild on 2D echo 09/15/2022 with PASP 37 mmHg  3.  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  Tricuspid regurgitation -moderate by echo 2018 and severe by echo 08/2019 -Referred to structural heart team but this never happened -2D echo 09/15/2022 showed severe TR due to incomplete coaptation of the tricuspid valve  leaflets and systolic flow reversal in the hepatic vein PW Doppler -Felt not to be a surgical candidate and poor percutaneous repair candidate due to her end-stage renal disease on HD.   -Seen by advanced heart failure clinic and not felt an indication for right heart cath and patient declined right heart cath  5.  Atypical chest pain -This has been very atypical in the past and has only occurred on HD but now is having more sporadically and not necessarily with HD -her cath in 2017 showed normal cors -I recommended a Stress PET CT to rule out ischemia but she refused  6.  Mitral valve prolapse -Echo 09/15/2022 showed bileaflet mitral valve prolapse with mild MR  7.  Carotid stenosis -dopplers 2018 with mild bilateral 1-39% stenosis -she has bilateral carotid bruits on exam today -repeat carotid dopplers   Disposition:  Follow up in 1 year(s)   Medication Adjustments/Labs and Tests Ordered: Current medicines are reviewed at length with the patient today.  Concerns regarding medicines are outlined above.  No orders of the defined types were placed in this encounter.   No orders of the defined types were placed in this encounter.    Signed, Armanda Magic, MD  01/03/2023 11:25 AM    [facility]

## 2023-01-03 NOTE — Addendum Note (Signed)
Addended by: Luellen Pucker on: 01/03/2023 12:27 PM   Modules accepted: Orders

## 2023-01-03 NOTE — Patient Instructions (Signed)
Medication Instructions:  Your physician recommends that you continue on your current medications as directed. Please refer to the Current Medication list given to you today.  *If you need a refill on your cardiac medications before your next appointment, please call your pharmacy*   Lab Work: None.  If you have labs (blood work) drawn today and your tests are completely normal, you will receive your results only by: [place] (if you have MyChart) OR A paper copy in the mail If you have any lab test that is abnormal or we need to change your treatment, we will call you to review the results.   Testing/Procedures: Your physician has requested that you have a carotid duplex. This test is an ultrasound of the carotid arteries in your neck. It looks at blood flow through these arteries that supply the brain with blood. Allow one hour for this exam. There are no restrictions or special instructions.    Follow-Up: At [facility], you and your health needs are our priority.  As part of our continuing mission to provide you with exceptional heart care, we have created designated Provider Care Teams.  These Care Teams include your primary Cardiologist (physician) and Advanced Practice Providers (APPs -  Physician Assistants and Nurse Practitioners) who all work together to provide you with the care you need, when you need it.  We recommend signing up for the patient portal called "MyChart".  Sign up information is provided on this After Visit Summary.  MyChart is used to connect with patients for Virtual Visits (Telemedicine).  Patients are able to view lab/test results, encounter notes, upcoming appointments, etc.  Non-urgent messages can be sent to your provider as well.   To learn more about what you can do with MyChart, go to [email or web address].    Your next appointment:   1 year(s)  Provider:   Fransico Him, MD

## 2023-01-04 DIAGNOSIS — Z992 Dependence on renal dialysis: Secondary | ICD-10-CM | POA: Diagnosis not present

## 2023-01-04 DIAGNOSIS — N186 End stage renal disease: Secondary | ICD-10-CM | POA: Diagnosis not present

## 2023-01-04 DIAGNOSIS — N2581 Secondary hyperparathyroidism of renal origin: Secondary | ICD-10-CM | POA: Diagnosis not present

## 2023-01-04 DIAGNOSIS — D509 Iron deficiency anemia, unspecified: Secondary | ICD-10-CM | POA: Diagnosis not present

## 2023-01-04 DIAGNOSIS — D631 Anemia in chronic kidney disease: Secondary | ICD-10-CM | POA: Diagnosis not present

## 2023-01-07 DIAGNOSIS — D631 Anemia in chronic kidney disease: Secondary | ICD-10-CM | POA: Diagnosis not present

## 2023-01-07 DIAGNOSIS — N186 End stage renal disease: Secondary | ICD-10-CM | POA: Diagnosis not present

## 2023-01-07 DIAGNOSIS — Z992 Dependence on renal dialysis: Secondary | ICD-10-CM | POA: Diagnosis not present

## 2023-01-07 DIAGNOSIS — N2581 Secondary hyperparathyroidism of renal origin: Secondary | ICD-10-CM | POA: Diagnosis not present

## 2023-01-07 DIAGNOSIS — D509 Iron deficiency anemia, unspecified: Secondary | ICD-10-CM | POA: Diagnosis not present

## 2023-01-09 DIAGNOSIS — D509 Iron deficiency anemia, unspecified: Secondary | ICD-10-CM | POA: Diagnosis not present

## 2023-01-09 DIAGNOSIS — N2581 Secondary hyperparathyroidism of renal origin: Secondary | ICD-10-CM | POA: Diagnosis not present

## 2023-01-09 DIAGNOSIS — D631 Anemia in chronic kidney disease: Secondary | ICD-10-CM | POA: Diagnosis not present

## 2023-01-09 DIAGNOSIS — Z992 Dependence on renal dialysis: Secondary | ICD-10-CM | POA: Diagnosis not present

## 2023-01-09 DIAGNOSIS — N186 End stage renal disease: Secondary | ICD-10-CM | POA: Diagnosis not present

## 2023-01-11 DIAGNOSIS — N2581 Secondary hyperparathyroidism of renal origin: Secondary | ICD-10-CM | POA: Diagnosis not present

## 2023-01-11 DIAGNOSIS — Z992 Dependence on renal dialysis: Secondary | ICD-10-CM | POA: Diagnosis not present

## 2023-01-11 DIAGNOSIS — N186 End stage renal disease: Secondary | ICD-10-CM | POA: Diagnosis not present

## 2023-01-11 DIAGNOSIS — D509 Iron deficiency anemia, unspecified: Secondary | ICD-10-CM | POA: Diagnosis not present

## 2023-01-11 DIAGNOSIS — D631 Anemia in chronic kidney disease: Secondary | ICD-10-CM | POA: Diagnosis not present

## 2023-01-14 DIAGNOSIS — D509 Iron deficiency anemia, unspecified: Secondary | ICD-10-CM | POA: Diagnosis not present

## 2023-01-14 DIAGNOSIS — Z992 Dependence on renal dialysis: Secondary | ICD-10-CM | POA: Diagnosis not present

## 2023-01-14 DIAGNOSIS — N2581 Secondary hyperparathyroidism of renal origin: Secondary | ICD-10-CM | POA: Diagnosis not present

## 2023-01-14 DIAGNOSIS — N186 End stage renal disease: Secondary | ICD-10-CM | POA: Diagnosis not present

## 2023-01-14 DIAGNOSIS — D631 Anemia in chronic kidney disease: Secondary | ICD-10-CM | POA: Diagnosis not present

## 2023-01-15 DIAGNOSIS — M545 Low back pain, unspecified: Secondary | ICD-10-CM | POA: Diagnosis not present

## 2023-01-16 DIAGNOSIS — Z992 Dependence on renal dialysis: Secondary | ICD-10-CM | POA: Diagnosis not present

## 2023-01-16 DIAGNOSIS — D509 Iron deficiency anemia, unspecified: Secondary | ICD-10-CM | POA: Diagnosis not present

## 2023-01-16 DIAGNOSIS — N2581 Secondary hyperparathyroidism of renal origin: Secondary | ICD-10-CM | POA: Diagnosis not present

## 2023-01-16 DIAGNOSIS — N186 End stage renal disease: Secondary | ICD-10-CM | POA: Diagnosis not present

## 2023-01-16 DIAGNOSIS — D631 Anemia in chronic kidney disease: Secondary | ICD-10-CM | POA: Diagnosis not present

## 2023-01-17 ENCOUNTER — Ambulatory Visit (HOSPITAL_COMMUNITY)
Admission: RE | Admit: 2023-01-17 | Discharge: 2023-01-17 | Disposition: A | Discharge status: Home / Self Care | Payer: Medicare Other | Source: Ambulatory Visit | Attending: Internal Medicine | Admitting: Internal Medicine

## 2023-01-17 DIAGNOSIS — Z8673 Personal history of transient ischemic attack (TIA), and cerebral infarction without residual deficits: Secondary | ICD-10-CM | POA: Diagnosis not present

## 2023-01-17 DIAGNOSIS — I6529 Occlusion and stenosis of unspecified carotid artery: Secondary | ICD-10-CM | POA: Diagnosis not present

## 2023-01-18 DIAGNOSIS — N2581 Secondary hyperparathyroidism of renal origin: Secondary | ICD-10-CM | POA: Diagnosis not present

## 2023-01-18 DIAGNOSIS — Z992 Dependence on renal dialysis: Secondary | ICD-10-CM | POA: Diagnosis not present

## 2023-01-18 DIAGNOSIS — D631 Anemia in chronic kidney disease: Secondary | ICD-10-CM | POA: Diagnosis not present

## 2023-01-18 DIAGNOSIS — D509 Iron deficiency anemia, unspecified: Secondary | ICD-10-CM | POA: Diagnosis not present

## 2023-01-18 DIAGNOSIS — N186 End stage renal disease: Secondary | ICD-10-CM | POA: Diagnosis not present

## 2023-01-20 DIAGNOSIS — D631 Anemia in chronic kidney disease: Secondary | ICD-10-CM | POA: Diagnosis not present

## 2023-01-20 DIAGNOSIS — N2581 Secondary hyperparathyroidism of renal origin: Secondary | ICD-10-CM | POA: Diagnosis not present

## 2023-01-20 DIAGNOSIS — Z992 Dependence on renal dialysis: Secondary | ICD-10-CM | POA: Diagnosis not present

## 2023-01-20 DIAGNOSIS — D509 Iron deficiency anemia, unspecified: Secondary | ICD-10-CM | POA: Diagnosis not present

## 2023-01-20 DIAGNOSIS — N186 End stage renal disease: Secondary | ICD-10-CM | POA: Diagnosis not present

## 2023-01-21 ENCOUNTER — Telehealth: Payer: Self-pay

## 2023-01-21 DIAGNOSIS — I771 Stricture of artery: Secondary | ICD-10-CM

## 2023-01-21 NOTE — Telephone Encounter (Signed)
Call to patient to advise no carotid artery stenosis found on ultrasound, however, possible left subclavian artery stenosis present. Patient does endorse problems with left arm pain or numbness but says this has been a chronic issue "on and off" as she has had neck pain from her spine as well as issues with dialysis access for years. Patient agrees to  bilateral upper extremities dopplers for possible subclavian artery stenosis, order placed.

## 2023-01-21 NOTE — Telephone Encounter (Signed)
-----   Message from Armanda Magic sent at 01/18/2023  4:11 PM EST ----- Please get bilateral upper extremities dopplers for possible subclavian artery stenosis

## 2023-01-22 DIAGNOSIS — Z992 Dependence on renal dialysis: Secondary | ICD-10-CM | POA: Diagnosis not present

## 2023-01-22 DIAGNOSIS — D509 Iron deficiency anemia, unspecified: Secondary | ICD-10-CM | POA: Diagnosis not present

## 2023-01-22 DIAGNOSIS — D631 Anemia in chronic kidney disease: Secondary | ICD-10-CM | POA: Diagnosis not present

## 2023-01-22 DIAGNOSIS — N186 End stage renal disease: Secondary | ICD-10-CM | POA: Diagnosis not present

## 2023-01-22 DIAGNOSIS — N2581 Secondary hyperparathyroidism of renal origin: Secondary | ICD-10-CM | POA: Diagnosis not present

## 2023-01-25 DIAGNOSIS — Z992 Dependence on renal dialysis: Secondary | ICD-10-CM | POA: Diagnosis not present

## 2023-01-25 DIAGNOSIS — D509 Iron deficiency anemia, unspecified: Secondary | ICD-10-CM | POA: Diagnosis not present

## 2023-01-25 DIAGNOSIS — D631 Anemia in chronic kidney disease: Secondary | ICD-10-CM | POA: Diagnosis not present

## 2023-01-25 DIAGNOSIS — N186 End stage renal disease: Secondary | ICD-10-CM | POA: Diagnosis not present

## 2023-01-25 DIAGNOSIS — N2581 Secondary hyperparathyroidism of renal origin: Secondary | ICD-10-CM | POA: Diagnosis not present

## 2023-01-27 DIAGNOSIS — N186 End stage renal disease: Secondary | ICD-10-CM | POA: Diagnosis not present

## 2023-01-27 DIAGNOSIS — N039 Chronic nephritic syndrome with unspecified morphologic changes: Secondary | ICD-10-CM | POA: Diagnosis not present

## 2023-01-27 DIAGNOSIS — Z992 Dependence on renal dialysis: Secondary | ICD-10-CM | POA: Diagnosis not present

## 2023-01-28 DIAGNOSIS — Z992 Dependence on renal dialysis: Secondary | ICD-10-CM | POA: Diagnosis not present

## 2023-01-28 DIAGNOSIS — N2581 Secondary hyperparathyroidism of renal origin: Secondary | ICD-10-CM | POA: Diagnosis not present

## 2023-01-28 DIAGNOSIS — D631 Anemia in chronic kidney disease: Secondary | ICD-10-CM | POA: Diagnosis not present

## 2023-01-28 DIAGNOSIS — N186 End stage renal disease: Secondary | ICD-10-CM | POA: Diagnosis not present

## 2023-01-28 DIAGNOSIS — D509 Iron deficiency anemia, unspecified: Secondary | ICD-10-CM | POA: Diagnosis not present

## 2023-01-29 DIAGNOSIS — M545 Low back pain, unspecified: Secondary | ICD-10-CM | POA: Diagnosis not present

## 2023-01-30 DIAGNOSIS — D631 Anemia in chronic kidney disease: Secondary | ICD-10-CM | POA: Diagnosis not present

## 2023-01-30 DIAGNOSIS — N2581 Secondary hyperparathyroidism of renal origin: Secondary | ICD-10-CM | POA: Diagnosis not present

## 2023-01-30 DIAGNOSIS — N186 End stage renal disease: Secondary | ICD-10-CM | POA: Diagnosis not present

## 2023-01-30 DIAGNOSIS — D509 Iron deficiency anemia, unspecified: Secondary | ICD-10-CM | POA: Diagnosis not present

## 2023-01-30 DIAGNOSIS — Z992 Dependence on renal dialysis: Secondary | ICD-10-CM | POA: Diagnosis not present

## 2023-02-01 DIAGNOSIS — N2581 Secondary hyperparathyroidism of renal origin: Secondary | ICD-10-CM | POA: Diagnosis not present

## 2023-02-01 DIAGNOSIS — D631 Anemia in chronic kidney disease: Secondary | ICD-10-CM | POA: Diagnosis not present

## 2023-02-01 DIAGNOSIS — N186 End stage renal disease: Secondary | ICD-10-CM | POA: Diagnosis not present

## 2023-02-01 DIAGNOSIS — Z992 Dependence on renal dialysis: Secondary | ICD-10-CM | POA: Diagnosis not present

## 2023-02-01 DIAGNOSIS — D509 Iron deficiency anemia, unspecified: Secondary | ICD-10-CM | POA: Diagnosis not present

## 2023-02-04 DIAGNOSIS — Z992 Dependence on renal dialysis: Secondary | ICD-10-CM | POA: Diagnosis not present

## 2023-02-04 DIAGNOSIS — N186 End stage renal disease: Secondary | ICD-10-CM | POA: Diagnosis not present

## 2023-02-04 DIAGNOSIS — D631 Anemia in chronic kidney disease: Secondary | ICD-10-CM | POA: Diagnosis not present

## 2023-02-04 DIAGNOSIS — N2581 Secondary hyperparathyroidism of renal origin: Secondary | ICD-10-CM | POA: Diagnosis not present

## 2023-02-04 DIAGNOSIS — D509 Iron deficiency anemia, unspecified: Secondary | ICD-10-CM | POA: Diagnosis not present

## 2023-02-05 ENCOUNTER — Ambulatory Visit (HOSPITAL_COMMUNITY)
Admission: RE | Admit: 2023-02-05 | Discharge: 2023-02-05 | Disposition: A | Discharge status: Home / Self Care | Payer: Medicare Other | Source: Ambulatory Visit | Attending: Cardiovascular Disease | Admitting: Cardiovascular Disease

## 2023-02-05 DIAGNOSIS — I771 Stricture of artery: Secondary | ICD-10-CM | POA: Diagnosis not present

## 2023-02-05 DIAGNOSIS — M545 Low back pain, unspecified: Secondary | ICD-10-CM | POA: Diagnosis not present

## 2023-02-06 DIAGNOSIS — Z992 Dependence on renal dialysis: Secondary | ICD-10-CM | POA: Diagnosis not present

## 2023-02-06 DIAGNOSIS — N186 End stage renal disease: Secondary | ICD-10-CM | POA: Diagnosis not present

## 2023-02-06 DIAGNOSIS — D631 Anemia in chronic kidney disease: Secondary | ICD-10-CM | POA: Diagnosis not present

## 2023-02-06 DIAGNOSIS — N2581 Secondary hyperparathyroidism of renal origin: Secondary | ICD-10-CM | POA: Diagnosis not present

## 2023-02-06 DIAGNOSIS — D509 Iron deficiency anemia, unspecified: Secondary | ICD-10-CM | POA: Diagnosis not present

## 2023-02-08 DIAGNOSIS — D509 Iron deficiency anemia, unspecified: Secondary | ICD-10-CM | POA: Diagnosis not present

## 2023-02-08 DIAGNOSIS — D631 Anemia in chronic kidney disease: Secondary | ICD-10-CM | POA: Diagnosis not present

## 2023-02-08 DIAGNOSIS — N186 End stage renal disease: Secondary | ICD-10-CM | POA: Diagnosis not present

## 2023-02-08 DIAGNOSIS — N2581 Secondary hyperparathyroidism of renal origin: Secondary | ICD-10-CM | POA: Diagnosis not present

## 2023-02-08 DIAGNOSIS — Z992 Dependence on renal dialysis: Secondary | ICD-10-CM | POA: Diagnosis not present

## 2023-02-11 DIAGNOSIS — D509 Iron deficiency anemia, unspecified: Secondary | ICD-10-CM | POA: Diagnosis not present

## 2023-02-11 DIAGNOSIS — D631 Anemia in chronic kidney disease: Secondary | ICD-10-CM | POA: Diagnosis not present

## 2023-02-11 DIAGNOSIS — Z992 Dependence on renal dialysis: Secondary | ICD-10-CM | POA: Diagnosis not present

## 2023-02-11 DIAGNOSIS — N186 End stage renal disease: Secondary | ICD-10-CM | POA: Diagnosis not present

## 2023-02-11 DIAGNOSIS — N2581 Secondary hyperparathyroidism of renal origin: Secondary | ICD-10-CM | POA: Diagnosis not present

## 2023-02-12 ENCOUNTER — Telehealth: Payer: Self-pay

## 2023-02-12 NOTE — Telephone Encounter (Signed)
Call to patient to discuss subclavian arter scan, patient verbalizes understanding of no evidence of subclavian artery stenosis.

## 2023-02-12 NOTE — Telephone Encounter (Signed)
-----   Message from Armanda Magic sent at 02/12/2023  7:14 AM EST ----- No evidence of subclavian artery stenosis.

## 2023-02-13 DIAGNOSIS — N186 End stage renal disease: Secondary | ICD-10-CM | POA: Diagnosis not present

## 2023-02-13 DIAGNOSIS — D509 Iron deficiency anemia, unspecified: Secondary | ICD-10-CM | POA: Diagnosis not present

## 2023-02-13 DIAGNOSIS — D631 Anemia in chronic kidney disease: Secondary | ICD-10-CM | POA: Diagnosis not present

## 2023-02-13 DIAGNOSIS — N2581 Secondary hyperparathyroidism of renal origin: Secondary | ICD-10-CM | POA: Diagnosis not present

## 2023-02-13 DIAGNOSIS — Z992 Dependence on renal dialysis: Secondary | ICD-10-CM | POA: Diagnosis not present

## 2023-02-14 DIAGNOSIS — N186 End stage renal disease: Secondary | ICD-10-CM | POA: Diagnosis not present

## 2023-02-14 DIAGNOSIS — M47896 Other spondylosis, lumbar region: Secondary | ICD-10-CM | POA: Diagnosis not present

## 2023-02-15 DIAGNOSIS — N186 End stage renal disease: Secondary | ICD-10-CM | POA: Diagnosis not present

## 2023-02-15 DIAGNOSIS — D509 Iron deficiency anemia, unspecified: Secondary | ICD-10-CM | POA: Diagnosis not present

## 2023-02-15 DIAGNOSIS — D631 Anemia in chronic kidney disease: Secondary | ICD-10-CM | POA: Diagnosis not present

## 2023-02-15 DIAGNOSIS — Z992 Dependence on renal dialysis: Secondary | ICD-10-CM | POA: Diagnosis not present

## 2023-02-15 DIAGNOSIS — N2581 Secondary hyperparathyroidism of renal origin: Secondary | ICD-10-CM | POA: Diagnosis not present

## 2023-02-17 DIAGNOSIS — N2581 Secondary hyperparathyroidism of renal origin: Secondary | ICD-10-CM | POA: Diagnosis not present

## 2023-02-17 DIAGNOSIS — Z992 Dependence on renal dialysis: Secondary | ICD-10-CM | POA: Diagnosis not present

## 2023-02-17 DIAGNOSIS — N186 End stage renal disease: Secondary | ICD-10-CM | POA: Diagnosis not present

## 2023-02-17 DIAGNOSIS — D509 Iron deficiency anemia, unspecified: Secondary | ICD-10-CM | POA: Diagnosis not present

## 2023-02-17 DIAGNOSIS — D631 Anemia in chronic kidney disease: Secondary | ICD-10-CM | POA: Diagnosis not present

## 2023-02-19 DIAGNOSIS — Z992 Dependence on renal dialysis: Secondary | ICD-10-CM | POA: Diagnosis not present

## 2023-02-19 DIAGNOSIS — D631 Anemia in chronic kidney disease: Secondary | ICD-10-CM | POA: Diagnosis not present

## 2023-02-19 DIAGNOSIS — D509 Iron deficiency anemia, unspecified: Secondary | ICD-10-CM | POA: Diagnosis not present

## 2023-02-19 DIAGNOSIS — N2581 Secondary hyperparathyroidism of renal origin: Secondary | ICD-10-CM | POA: Diagnosis not present

## 2023-02-19 DIAGNOSIS — N186 End stage renal disease: Secondary | ICD-10-CM | POA: Diagnosis not present

## 2023-02-22 DIAGNOSIS — D509 Iron deficiency anemia, unspecified: Secondary | ICD-10-CM | POA: Diagnosis not present

## 2023-02-22 DIAGNOSIS — D631 Anemia in chronic kidney disease: Secondary | ICD-10-CM | POA: Diagnosis not present

## 2023-02-22 DIAGNOSIS — N2581 Secondary hyperparathyroidism of renal origin: Secondary | ICD-10-CM | POA: Diagnosis not present

## 2023-02-22 DIAGNOSIS — Z992 Dependence on renal dialysis: Secondary | ICD-10-CM | POA: Diagnosis not present

## 2023-02-22 DIAGNOSIS — N186 End stage renal disease: Secondary | ICD-10-CM | POA: Diagnosis not present

## 2023-02-24 DIAGNOSIS — D631 Anemia in chronic kidney disease: Secondary | ICD-10-CM | POA: Diagnosis not present

## 2023-02-24 DIAGNOSIS — Z992 Dependence on renal dialysis: Secondary | ICD-10-CM | POA: Diagnosis not present

## 2023-02-24 DIAGNOSIS — D509 Iron deficiency anemia, unspecified: Secondary | ICD-10-CM | POA: Diagnosis not present

## 2023-02-24 DIAGNOSIS — N186 End stage renal disease: Secondary | ICD-10-CM | POA: Diagnosis not present

## 2023-02-24 DIAGNOSIS — N2581 Secondary hyperparathyroidism of renal origin: Secondary | ICD-10-CM | POA: Diagnosis not present

## 2023-02-26 DIAGNOSIS — D509 Iron deficiency anemia, unspecified: Secondary | ICD-10-CM | POA: Diagnosis not present

## 2023-02-26 DIAGNOSIS — D631 Anemia in chronic kidney disease: Secondary | ICD-10-CM | POA: Diagnosis not present

## 2023-02-26 DIAGNOSIS — Z992 Dependence on renal dialysis: Secondary | ICD-10-CM | POA: Diagnosis not present

## 2023-02-26 DIAGNOSIS — N186 End stage renal disease: Secondary | ICD-10-CM | POA: Diagnosis not present

## 2023-02-26 DIAGNOSIS — N2581 Secondary hyperparathyroidism of renal origin: Secondary | ICD-10-CM | POA: Diagnosis not present

## 2023-02-27 DIAGNOSIS — Z992 Dependence on renal dialysis: Secondary | ICD-10-CM | POA: Diagnosis not present

## 2023-02-27 DIAGNOSIS — N039 Chronic nephritic syndrome with unspecified morphologic changes: Secondary | ICD-10-CM | POA: Diagnosis not present

## 2023-02-27 DIAGNOSIS — N186 End stage renal disease: Secondary | ICD-10-CM | POA: Diagnosis not present

## 2023-02-28 DIAGNOSIS — L603 Nail dystrophy: Secondary | ICD-10-CM | POA: Diagnosis not present

## 2023-02-28 DIAGNOSIS — I739 Peripheral vascular disease, unspecified: Secondary | ICD-10-CM | POA: Diagnosis not present

## 2023-02-28 DIAGNOSIS — L84 Corns and callosities: Secondary | ICD-10-CM | POA: Diagnosis not present

## 2023-03-01 DIAGNOSIS — N2581 Secondary hyperparathyroidism of renal origin: Secondary | ICD-10-CM | POA: Diagnosis not present

## 2023-03-01 DIAGNOSIS — N186 End stage renal disease: Secondary | ICD-10-CM | POA: Diagnosis not present

## 2023-03-01 DIAGNOSIS — D631 Anemia in chronic kidney disease: Secondary | ICD-10-CM | POA: Diagnosis not present

## 2023-03-01 DIAGNOSIS — Z992 Dependence on renal dialysis: Secondary | ICD-10-CM | POA: Diagnosis not present

## 2023-03-01 DIAGNOSIS — D509 Iron deficiency anemia, unspecified: Secondary | ICD-10-CM | POA: Diagnosis not present

## 2023-03-04 DIAGNOSIS — Z992 Dependence on renal dialysis: Secondary | ICD-10-CM | POA: Diagnosis not present

## 2023-03-04 DIAGNOSIS — D509 Iron deficiency anemia, unspecified: Secondary | ICD-10-CM | POA: Diagnosis not present

## 2023-03-04 DIAGNOSIS — N186 End stage renal disease: Secondary | ICD-10-CM | POA: Diagnosis not present

## 2023-03-04 DIAGNOSIS — D631 Anemia in chronic kidney disease: Secondary | ICD-10-CM | POA: Diagnosis not present

## 2023-03-04 DIAGNOSIS — N2581 Secondary hyperparathyroidism of renal origin: Secondary | ICD-10-CM | POA: Diagnosis not present

## 2023-03-05 DIAGNOSIS — M545 Low back pain, unspecified: Secondary | ICD-10-CM | POA: Diagnosis not present

## 2023-03-06 DIAGNOSIS — D509 Iron deficiency anemia, unspecified: Secondary | ICD-10-CM | POA: Diagnosis not present

## 2023-03-06 DIAGNOSIS — N2581 Secondary hyperparathyroidism of renal origin: Secondary | ICD-10-CM | POA: Diagnosis not present

## 2023-03-06 DIAGNOSIS — Z992 Dependence on renal dialysis: Secondary | ICD-10-CM | POA: Diagnosis not present

## 2023-03-06 DIAGNOSIS — N186 End stage renal disease: Secondary | ICD-10-CM | POA: Diagnosis not present

## 2023-03-06 DIAGNOSIS — D631 Anemia in chronic kidney disease: Secondary | ICD-10-CM | POA: Diagnosis not present

## 2023-03-07 DIAGNOSIS — M545 Low back pain, unspecified: Secondary | ICD-10-CM | POA: Diagnosis not present

## 2023-03-08 DIAGNOSIS — D509 Iron deficiency anemia, unspecified: Secondary | ICD-10-CM | POA: Diagnosis not present

## 2023-03-08 DIAGNOSIS — Z992 Dependence on renal dialysis: Secondary | ICD-10-CM | POA: Diagnosis not present

## 2023-03-08 DIAGNOSIS — N2581 Secondary hyperparathyroidism of renal origin: Secondary | ICD-10-CM | POA: Diagnosis not present

## 2023-03-08 DIAGNOSIS — D631 Anemia in chronic kidney disease: Secondary | ICD-10-CM | POA: Diagnosis not present

## 2023-03-08 DIAGNOSIS — N186 End stage renal disease: Secondary | ICD-10-CM | POA: Diagnosis not present

## 2023-03-11 DIAGNOSIS — N2581 Secondary hyperparathyroidism of renal origin: Secondary | ICD-10-CM | POA: Diagnosis not present

## 2023-03-11 DIAGNOSIS — N186 End stage renal disease: Secondary | ICD-10-CM | POA: Diagnosis not present

## 2023-03-11 DIAGNOSIS — D631 Anemia in chronic kidney disease: Secondary | ICD-10-CM | POA: Diagnosis not present

## 2023-03-11 DIAGNOSIS — D509 Iron deficiency anemia, unspecified: Secondary | ICD-10-CM | POA: Diagnosis not present

## 2023-03-11 DIAGNOSIS — Z992 Dependence on renal dialysis: Secondary | ICD-10-CM | POA: Diagnosis not present

## 2023-03-12 DIAGNOSIS — M545 Low back pain, unspecified: Secondary | ICD-10-CM | POA: Diagnosis not present

## 2023-03-13 DIAGNOSIS — N186 End stage renal disease: Secondary | ICD-10-CM | POA: Diagnosis not present

## 2023-03-13 DIAGNOSIS — D631 Anemia in chronic kidney disease: Secondary | ICD-10-CM | POA: Diagnosis not present

## 2023-03-13 DIAGNOSIS — D509 Iron deficiency anemia, unspecified: Secondary | ICD-10-CM | POA: Diagnosis not present

## 2023-03-13 DIAGNOSIS — N2581 Secondary hyperparathyroidism of renal origin: Secondary | ICD-10-CM | POA: Diagnosis not present

## 2023-03-13 DIAGNOSIS — Z992 Dependence on renal dialysis: Secondary | ICD-10-CM | POA: Diagnosis not present

## 2023-03-15 DIAGNOSIS — Z992 Dependence on renal dialysis: Secondary | ICD-10-CM | POA: Diagnosis not present

## 2023-03-15 DIAGNOSIS — D509 Iron deficiency anemia, unspecified: Secondary | ICD-10-CM | POA: Diagnosis not present

## 2023-03-15 DIAGNOSIS — N2581 Secondary hyperparathyroidism of renal origin: Secondary | ICD-10-CM | POA: Diagnosis not present

## 2023-03-15 DIAGNOSIS — D631 Anemia in chronic kidney disease: Secondary | ICD-10-CM | POA: Diagnosis not present

## 2023-03-15 DIAGNOSIS — N186 End stage renal disease: Secondary | ICD-10-CM | POA: Diagnosis not present

## 2023-03-18 DIAGNOSIS — Z992 Dependence on renal dialysis: Secondary | ICD-10-CM | POA: Diagnosis not present

## 2023-03-18 DIAGNOSIS — D509 Iron deficiency anemia, unspecified: Secondary | ICD-10-CM | POA: Diagnosis not present

## 2023-03-18 DIAGNOSIS — N186 End stage renal disease: Secondary | ICD-10-CM | POA: Diagnosis not present

## 2023-03-18 DIAGNOSIS — N2581 Secondary hyperparathyroidism of renal origin: Secondary | ICD-10-CM | POA: Diagnosis not present

## 2023-03-18 DIAGNOSIS — D631 Anemia in chronic kidney disease: Secondary | ICD-10-CM | POA: Diagnosis not present

## 2023-03-19 DIAGNOSIS — M545 Low back pain, unspecified: Secondary | ICD-10-CM | POA: Diagnosis not present

## 2023-03-20 DIAGNOSIS — Z992 Dependence on renal dialysis: Secondary | ICD-10-CM | POA: Diagnosis not present

## 2023-03-20 DIAGNOSIS — N2581 Secondary hyperparathyroidism of renal origin: Secondary | ICD-10-CM | POA: Diagnosis not present

## 2023-03-20 DIAGNOSIS — D509 Iron deficiency anemia, unspecified: Secondary | ICD-10-CM | POA: Diagnosis not present

## 2023-03-20 DIAGNOSIS — D631 Anemia in chronic kidney disease: Secondary | ICD-10-CM | POA: Diagnosis not present

## 2023-03-20 DIAGNOSIS — N186 End stage renal disease: Secondary | ICD-10-CM | POA: Diagnosis not present

## 2023-03-21 DIAGNOSIS — M545 Low back pain, unspecified: Secondary | ICD-10-CM | POA: Diagnosis not present

## 2023-03-22 DIAGNOSIS — Z992 Dependence on renal dialysis: Secondary | ICD-10-CM | POA: Diagnosis not present

## 2023-03-22 DIAGNOSIS — D631 Anemia in chronic kidney disease: Secondary | ICD-10-CM | POA: Diagnosis not present

## 2023-03-22 DIAGNOSIS — N2581 Secondary hyperparathyroidism of renal origin: Secondary | ICD-10-CM | POA: Diagnosis not present

## 2023-03-22 DIAGNOSIS — N186 End stage renal disease: Secondary | ICD-10-CM | POA: Diagnosis not present

## 2023-03-22 DIAGNOSIS — D509 Iron deficiency anemia, unspecified: Secondary | ICD-10-CM | POA: Diagnosis not present

## 2023-03-25 DIAGNOSIS — N186 End stage renal disease: Secondary | ICD-10-CM | POA: Diagnosis not present

## 2023-03-25 DIAGNOSIS — D509 Iron deficiency anemia, unspecified: Secondary | ICD-10-CM | POA: Diagnosis not present

## 2023-03-25 DIAGNOSIS — D631 Anemia in chronic kidney disease: Secondary | ICD-10-CM | POA: Diagnosis not present

## 2023-03-25 DIAGNOSIS — N2581 Secondary hyperparathyroidism of renal origin: Secondary | ICD-10-CM | POA: Diagnosis not present

## 2023-03-25 DIAGNOSIS — Z992 Dependence on renal dialysis: Secondary | ICD-10-CM | POA: Diagnosis not present

## 2023-03-26 DIAGNOSIS — M545 Low back pain, unspecified: Secondary | ICD-10-CM | POA: Diagnosis not present

## 2023-03-27 DIAGNOSIS — D631 Anemia in chronic kidney disease: Secondary | ICD-10-CM | POA: Diagnosis not present

## 2023-03-27 DIAGNOSIS — N186 End stage renal disease: Secondary | ICD-10-CM | POA: Diagnosis not present

## 2023-03-27 DIAGNOSIS — D509 Iron deficiency anemia, unspecified: Secondary | ICD-10-CM | POA: Diagnosis not present

## 2023-03-27 DIAGNOSIS — N2581 Secondary hyperparathyroidism of renal origin: Secondary | ICD-10-CM | POA: Diagnosis not present

## 2023-03-27 DIAGNOSIS — Z992 Dependence on renal dialysis: Secondary | ICD-10-CM | POA: Diagnosis not present

## 2023-03-28 DIAGNOSIS — M545 Low back pain, unspecified: Secondary | ICD-10-CM | POA: Diagnosis not present

## 2023-03-29 DIAGNOSIS — N186 End stage renal disease: Secondary | ICD-10-CM | POA: Diagnosis not present

## 2023-03-29 DIAGNOSIS — D631 Anemia in chronic kidney disease: Secondary | ICD-10-CM | POA: Diagnosis not present

## 2023-03-29 DIAGNOSIS — N2581 Secondary hyperparathyroidism of renal origin: Secondary | ICD-10-CM | POA: Diagnosis not present

## 2023-03-29 DIAGNOSIS — D509 Iron deficiency anemia, unspecified: Secondary | ICD-10-CM | POA: Diagnosis not present

## 2023-03-29 DIAGNOSIS — Z992 Dependence on renal dialysis: Secondary | ICD-10-CM | POA: Diagnosis not present

## 2023-03-30 DIAGNOSIS — N039 Chronic nephritic syndrome with unspecified morphologic changes: Secondary | ICD-10-CM | POA: Diagnosis not present

## 2023-03-30 DIAGNOSIS — N186 End stage renal disease: Secondary | ICD-10-CM | POA: Diagnosis not present

## 2023-03-30 DIAGNOSIS — Z992 Dependence on renal dialysis: Secondary | ICD-10-CM | POA: Diagnosis not present

## 2023-04-01 DIAGNOSIS — N2581 Secondary hyperparathyroidism of renal origin: Secondary | ICD-10-CM | POA: Diagnosis not present

## 2023-04-01 DIAGNOSIS — D631 Anemia in chronic kidney disease: Secondary | ICD-10-CM | POA: Diagnosis not present

## 2023-04-01 DIAGNOSIS — D509 Iron deficiency anemia, unspecified: Secondary | ICD-10-CM | POA: Diagnosis not present

## 2023-04-01 DIAGNOSIS — N186 End stage renal disease: Secondary | ICD-10-CM | POA: Diagnosis not present

## 2023-04-01 DIAGNOSIS — Z992 Dependence on renal dialysis: Secondary | ICD-10-CM | POA: Diagnosis not present

## 2023-04-03 DIAGNOSIS — N2581 Secondary hyperparathyroidism of renal origin: Secondary | ICD-10-CM | POA: Diagnosis not present

## 2023-04-03 DIAGNOSIS — N186 End stage renal disease: Secondary | ICD-10-CM | POA: Diagnosis not present

## 2023-04-03 DIAGNOSIS — Z992 Dependence on renal dialysis: Secondary | ICD-10-CM | POA: Diagnosis not present

## 2023-04-03 DIAGNOSIS — D631 Anemia in chronic kidney disease: Secondary | ICD-10-CM | POA: Diagnosis not present

## 2023-04-03 DIAGNOSIS — D509 Iron deficiency anemia, unspecified: Secondary | ICD-10-CM | POA: Diagnosis not present

## 2023-04-04 DIAGNOSIS — M546 Pain in thoracic spine: Secondary | ICD-10-CM | POA: Diagnosis not present

## 2023-04-04 DIAGNOSIS — M5451 Vertebrogenic low back pain: Secondary | ICD-10-CM | POA: Diagnosis not present

## 2023-04-05 DIAGNOSIS — D631 Anemia in chronic kidney disease: Secondary | ICD-10-CM | POA: Diagnosis not present

## 2023-04-05 DIAGNOSIS — N186 End stage renal disease: Secondary | ICD-10-CM | POA: Diagnosis not present

## 2023-04-05 DIAGNOSIS — Z992 Dependence on renal dialysis: Secondary | ICD-10-CM | POA: Diagnosis not present

## 2023-04-05 DIAGNOSIS — N2581 Secondary hyperparathyroidism of renal origin: Secondary | ICD-10-CM | POA: Diagnosis not present

## 2023-04-05 DIAGNOSIS — D509 Iron deficiency anemia, unspecified: Secondary | ICD-10-CM | POA: Diagnosis not present

## 2023-04-08 DIAGNOSIS — D509 Iron deficiency anemia, unspecified: Secondary | ICD-10-CM | POA: Diagnosis not present

## 2023-04-08 DIAGNOSIS — Z992 Dependence on renal dialysis: Secondary | ICD-10-CM | POA: Diagnosis not present

## 2023-04-08 DIAGNOSIS — D631 Anemia in chronic kidney disease: Secondary | ICD-10-CM | POA: Diagnosis not present

## 2023-04-08 DIAGNOSIS — N186 End stage renal disease: Secondary | ICD-10-CM | POA: Diagnosis not present

## 2023-04-08 DIAGNOSIS — N2581 Secondary hyperparathyroidism of renal origin: Secondary | ICD-10-CM | POA: Diagnosis not present

## 2023-04-10 DIAGNOSIS — D631 Anemia in chronic kidney disease: Secondary | ICD-10-CM | POA: Diagnosis not present

## 2023-04-10 DIAGNOSIS — N186 End stage renal disease: Secondary | ICD-10-CM | POA: Diagnosis not present

## 2023-04-10 DIAGNOSIS — Z992 Dependence on renal dialysis: Secondary | ICD-10-CM | POA: Diagnosis not present

## 2023-04-10 DIAGNOSIS — D509 Iron deficiency anemia, unspecified: Secondary | ICD-10-CM | POA: Diagnosis not present

## 2023-04-10 DIAGNOSIS — N2581 Secondary hyperparathyroidism of renal origin: Secondary | ICD-10-CM | POA: Diagnosis not present

## 2023-04-12 DIAGNOSIS — N2581 Secondary hyperparathyroidism of renal origin: Secondary | ICD-10-CM | POA: Diagnosis not present

## 2023-04-12 DIAGNOSIS — N186 End stage renal disease: Secondary | ICD-10-CM | POA: Diagnosis not present

## 2023-04-12 DIAGNOSIS — D509 Iron deficiency anemia, unspecified: Secondary | ICD-10-CM | POA: Diagnosis not present

## 2023-04-12 DIAGNOSIS — D631 Anemia in chronic kidney disease: Secondary | ICD-10-CM | POA: Diagnosis not present

## 2023-04-12 DIAGNOSIS — Z992 Dependence on renal dialysis: Secondary | ICD-10-CM | POA: Diagnosis not present

## 2023-04-15 DIAGNOSIS — N186 End stage renal disease: Secondary | ICD-10-CM | POA: Diagnosis not present

## 2023-04-15 DIAGNOSIS — Z992 Dependence on renal dialysis: Secondary | ICD-10-CM | POA: Diagnosis not present

## 2023-04-15 DIAGNOSIS — N2581 Secondary hyperparathyroidism of renal origin: Secondary | ICD-10-CM | POA: Diagnosis not present

## 2023-04-15 DIAGNOSIS — D509 Iron deficiency anemia, unspecified: Secondary | ICD-10-CM | POA: Diagnosis not present

## 2023-04-15 DIAGNOSIS — D631 Anemia in chronic kidney disease: Secondary | ICD-10-CM | POA: Diagnosis not present

## 2023-04-17 DIAGNOSIS — D631 Anemia in chronic kidney disease: Secondary | ICD-10-CM | POA: Diagnosis not present

## 2023-04-17 DIAGNOSIS — Z992 Dependence on renal dialysis: Secondary | ICD-10-CM | POA: Diagnosis not present

## 2023-04-17 DIAGNOSIS — D509 Iron deficiency anemia, unspecified: Secondary | ICD-10-CM | POA: Diagnosis not present

## 2023-04-17 DIAGNOSIS — N2581 Secondary hyperparathyroidism of renal origin: Secondary | ICD-10-CM | POA: Diagnosis not present

## 2023-04-17 DIAGNOSIS — N186 End stage renal disease: Secondary | ICD-10-CM | POA: Diagnosis not present

## 2023-04-19 DIAGNOSIS — D631 Anemia in chronic kidney disease: Secondary | ICD-10-CM | POA: Diagnosis not present

## 2023-04-19 DIAGNOSIS — D509 Iron deficiency anemia, unspecified: Secondary | ICD-10-CM | POA: Diagnosis not present

## 2023-04-19 DIAGNOSIS — N2581 Secondary hyperparathyroidism of renal origin: Secondary | ICD-10-CM | POA: Diagnosis not present

## 2023-04-19 DIAGNOSIS — N186 End stage renal disease: Secondary | ICD-10-CM | POA: Diagnosis not present

## 2023-04-19 DIAGNOSIS — Z992 Dependence on renal dialysis: Secondary | ICD-10-CM | POA: Diagnosis not present

## 2023-04-20 ENCOUNTER — Encounter (HOSPITAL_COMMUNITY): Payer: Self-pay | Admitting: *Deleted

## 2023-04-20 ENCOUNTER — Ambulatory Visit (HOSPITAL_COMMUNITY)
Admission: EM | Admit: 2023-04-20 | Discharge: 2023-04-20 | Disposition: A | Discharge status: Home / Self Care | Payer: Medicare Other | Attending: Internal Medicine | Admitting: Internal Medicine

## 2023-04-20 DIAGNOSIS — J0100 Acute maxillary sinusitis, unspecified: Secondary | ICD-10-CM | POA: Diagnosis not present

## 2023-04-20 MED ORDER — FLUTICASONE PROPIONATE 50 MCG/ACT NA SUSP
2.0000 | Freq: Every day | NASAL | 2 refills | Status: AC
Start: 2023-04-20 — End: ?

## 2023-04-20 MED ORDER — AZITHROMYCIN 250 MG PO TABS
ORAL_TABLET | ORAL | 0 refills | Status: DC
Start: 2023-04-20 — End: 2023-05-30

## 2023-04-20 NOTE — Discharge Instructions (Addendum)
 Symptoms are most consistent with a sinus infection. This is likely due to a bacterial source.  We will treat this with the following:  Azithromycin 250mg  Take 2 tablets today and the 1 tablet daily for 4 more days. Flonase 2 sprays each nostril once daily for 7 days for nasal congestion.  May use as needed after this. Drink plenty of fluids to stay hydrated.  May use a humidifier at home for the sinus congestion as well.  Return to urgent care or PCP if symptoms worsen or fail to resolve.

## 2023-04-20 NOTE — ED Triage Notes (Signed)
 Pt states that she has a lot of fluid on her ears X 3 weeks. Three days ago she started having a runny nose.

## 2023-04-20 NOTE — ED Provider Notes (Signed)
 [facility]    CSN: [id number] Arrival date & time: 04/20/23  1008      History   Chief Complaint Chief Complaint  Patient presents with   Ear Fullness   Nasal Congestion    HPI [name] Marks is a 68 y.o. female.   68 y.o. female who presents to urgent care with complaints of ear fullness, sinus congestion, runny nose and some bleeding from the sinuses.  This has been going on for almost 3 weeks now.  The symptoms do not seem to be getting much better.  A few days ago the nose started running continuously.  She denies fevers, chills, cough, headache, sick exposures.  She does have some tenderness along the maxillary sinuses.   Ear Fullness Pertinent negatives include no chest pain, no abdominal pain and no shortness of breath.    Past Medical History:  Diagnosis Date   Anemia    Aortic valve prosthesis present 02/25/2016   Arthritis    "knees" (01/03/2017)   AVD (aortic valve disease) 07/12/2016   Backache 12/06/2008   Bacteremia due to coagulase-negative Staphylococcus    Carpal tunnel syndrome    Cerebral embolism with transient ischemic attack (TIA)    Cholelithiases 01/28/2017   Chronic female pelvic pain 08/08/2012   Complication of anesthesia    01/01/17- '"a long time ago, difficulty breathimg, not sure if it was due to anesthesia or not."   CVA (cerebral vascular accident) (HCC) 05/16/2016   End stage renal disease on dialysis (HCC)    "MWF;  Rd." (01/03/2017)   Endocarditis    Esophageal reflux 12/06/2008   GERD (gastroesophageal reflux disease)    Gout    History of blood transfusion 2017   "related to blood poison" (01/03/2017)   Increased endometrial stripe thickness 11/03/2015   Neck pain    Osteoporosis 09/2016   T score -2.6   Pain and swelling of right upper extremity 12/20/2015   Pulmonary HTN (HCC) 07/12/2016   Renal dialysis device, implant, or graft complication 12/20/2015   Renal insufficiency    S/P cholecystectomy  02/14/2017   Septic shock (HCC)    Staphylococcus aureus bacteremia    TIA (transient ischemic attack)    "several" (01/03/2017)    Patient Active Problem List   Diagnosis Date Noted   History of colonic polyps 05/16/2021   Endocarditis 07/30/2020   Hyperparathyroidism due to renal insufficiency (HCC) 07/30/2020   Tooth, broken 12/18/2018   Hypotension 10/03/2018   Diarrhea, unspecified 08/25/2018   Orthostatic hypotension 03/14/2018   S/P cholecystectomy 02/14/2017   Osteoporosis 12/11/2016   Cholelithiasis 11/07/2016   Cerebral embolism with transient ischemic attack (TIA)    Bilateral carpal tunnel syndrome 08/09/2016   AVD (aortic valve disease) 07/12/2016   Pulmonary HTN (HCC) 07/12/2016   Carpal tunnel syndrome 07/05/2016   Disorder of both mastoids 05/29/2016   CVA (cerebral vascular accident) (HCC) 05/16/2016   History of aortic valve replacement with bioprosthetic valve 05/08/2016   Bacteremia due to coagulase-negative Staphylococcus    History of endocarditis    Anemia    Complex endometrial hyperplasia with atypia 11/08/2015   Dependence on renal dialysis (HCC) 12/15/2014   Obesity 10/28/2014   Right carotid bruit 01/08/2013   Generalized headaches 01/08/2013   Chronic female pelvic pain 08/08/2012   Gout, unspecified 12/06/2008   Esophageal reflux 12/06/2008   Backache 12/06/2008   FIBROIDS, UTERUS 11/24/2008   Diverticulosis of large intestine without perforation or abscess without bleeding 03/22/2008   ESRD (  end stage renal disease) on dialysis (HCC) 07/01/2006   Anemia in chronic kidney disease 12/18/2001    Past Surgical History:  Procedure Laterality Date   AORTIC VALVE REPLACEMENT N/A 02/25/2016   Procedure: AORTIC VALVE REPLACEMENT (AVR) implanted with Magna Ease Aortic valve size 21mm;  Surgeon: Loreli Slot, MD;  [facility] OR;  Service: Open Heart Surgery;  Laterality: N/A;   AV FISTULA PLACEMENT Left 01/03/2017   Procedure: INSERTION OF  ARTERIOVENOUS (AV) GORE-TEX GRAFT LEFT THIGH;  Surgeon: Nada Libman, MD;  Location: [facility] OR;  Service: Vascular;  Laterality: Left;   AVGG REMOVAL Right 02/17/2016   Procedure: REMOVAL OF TWO ARTERIOVENOUS GORETEX GRAFTS (AVGG);  Surgeon: Chuck Hint, MD;  Location: [facility] OR;  Service: Vascular;  Laterality: Right;   BIOPSY  05/16/2021   Procedure: BIOPSY;  Surgeon: Charlott Rakes, MD;  Location: [facility] ENDOSCOPY;  Service: Endoscopy;;   BREAST BIOPSY Left 2013   stereo    BREAST BIOPSY Right 2011   stereo    CARDIAC VALVE REPLACEMENT     CHOLECYSTECTOMY N/A 01/30/2017   Procedure: LAPAROSCOPIC CHOLECYSTECTOMY;  Surgeon: Harriette Bouillon, MD;  Location: [facility] OR;  Service: General;  Laterality: N/A;   COLONOSCOPY W/ POLYPECTOMY     COLONOSCOPY WITH PROPOFOL N/A 05/16/2021   Procedure: COLONOSCOPY WITH PROPOFOL;  Surgeon: Charlott Rakes, MD;  Location: [facility] ENDOSCOPY;  Service: Endoscopy;  Laterality: N/A;   DG AV DIALYSIS GRAFT DECLOT OR     DILATATION & CURETTAGE/HYSTEROSCOPY WITH TRUECLEAR N/A 11/06/2012   Procedure: DILATATION & CURETTAGE/HYSTEROSCOPY WITH TRUECLEAR;  Surgeon: Ok Edwards, MD;  Location: [facility] ORS;  Service: Gynecology;  Laterality: N/A;  Truclear Resectoscopic Polypectomy    INSERTION OF DIALYSIS CATHETER N/A 02/19/2016   Procedure: INSERTION OF Left Internal Jugular DIALYSIS CATHETER;  Surgeon: Chuck Hint, MD;  Location: [facility] OR;  Service: Vascular;  Laterality: N/A;   LOOP RECORDER INSERTION N/A 10/01/2016   Procedure: LOOP RECORDER INSERTION;  Surgeon: Regan Lemming, MD;  Location: [facility];  Service: Cardiovascular;  Laterality: N/A;   PATCH ANGIOPLASTY Right 02/17/2016   Procedure: PATCH ANGIOPLASTY;  Surgeon: Chuck Hint, MD;  Location: [facility] OR;  Service: Vascular;  Laterality: Right;   PERIPHERAL VASCULAR CATHETERIZATION N/A 09/14/2014   Procedure: A/V Shuntogram/Fistulagram;  Surgeon: Renford Dills, MD;  Location: [facility]  INVASIVE CV LAB;  Service: Cardiovascular;  Laterality: N/A;   PERIPHERAL VASCULAR CATHETERIZATION N/A 09/14/2014   Procedure: A/V Shunt Intervention;  Surgeon: Renford Dills, MD;  Location: [facility] INVASIVE CV LAB;  Service: Cardiovascular;  Laterality: N/A;   PERIPHERAL VASCULAR CATHETERIZATION Right 12/09/2014   Procedure: A/V Shuntogram/Fistulagram;  Surgeon: Annice Needy, MD;  Location: [facility] INVASIVE CV LAB;  Service: Cardiovascular;  Laterality: Right;   PERIPHERAL VASCULAR CATHETERIZATION N/A 12/09/2014   Procedure: A/V Shunt Intervention;  Surgeon: Annice Needy, MD;  Location: [facility] INVAS[facility] LAB;  Service: Cardiovascular;  Laterality: N/A;   PERIPHERAL VASCULAR CATHETERIZATION Right 05/24/2015   Procedure: A/V Shuntogram;  Surgeon: Nada Libman, MD;  Location: [facility];  Service: Cardiovascular;  Laterality: Right;   PERIPHERAL VASCULAR CATHETERIZATION Right 05/24/2015   Procedure: Peripheral Vascular Balloon Angioplasty;  Surgeon: Nada Libman, MD;  Location: [facility];  Service: Cardiovascular;  Laterality: Right;  right arm shunt   PERIPHERAL VASCULAR CATHETERIZATION N/A 06/13/2015   Procedure: A/V Shuntogram/Fistulagram;  Surgeon: Annice Needy, MD;  Location: [facility] INV[facility] LAB;  Service: Cardiovascular;  Laterality: N/A;  PERIPHERAL VASCULAR CATHETERIZATION N/A 06/13/2015   Procedure: A/V Shunt Intervention;  Surgeon: Annice Needy, MD;  Location: [facility] INVAS[facility] CV LAB;  Service: Cardiovascular;  Laterality: N/A;   TEE WITHOUT CARDIOVERSION N/A 02/22/2016   Procedure: TRANSESOPHAGEAL ECHOCARDIOGRAM (TEE);  Surgeon: Chrystie Nose, MD;  Location: [facility] ENDOSCOPY;  Service: Cardiovascular;  Laterality: N/A;   TEE WITHOUT CARDIOVERSION N/A 02/25/2016   Procedure: TRANSESOPHAGEAL ECHOCARDIOGRAM (TEE);  Surgeon: Loreli Slot, MD;  Location: [facility] OR;  Service: Open Heart Surgery;  Laterality: N/A;   TEE WITHOUT CARDIOVERSION N/A 10/01/2016   Procedure: TRANSESOPHAGEAL  ECHOCARDIOGRAM (TEE);  Surgeon: Chrystie Nose, MD;  Location: [facility] ENDOSCOPY;  Service: Cardiovascular;  Laterality: N/A;   TUBAL LIGATION  1983    OB History     Gravida  2   Para  2   Term  2   Preterm      AB      Living  2      SAB      IAB      Ectopic      Multiple      Live Births               Home Medications    Prior to Admission medications   Medication Sig Start Date End Date Taking? Authorizing Provider  azithromycin (ZITHROMAX) 250 MG tablet Take first 2 tablets together, then 1 every day until finished. 04/20/23  Yes Bethann Qualley A, PA-C  cinacalcet (SENSIPAR) 90 MG tablet Take 90 mg by mouth at bedtime.   Yes [provider]  clopidogrel (PLAVIX) 75 MG tablet Take 1 tablet (75 mg total) by mouth daily. 05/15/22  Yes Conte, Tessa N, PA-C  famotidine (PEPCID) 20 MG tablet Take 20 mg by mouth daily after supper.   Yes [provider]  fluticasone (FLONASE) 50 MCG/ACT nasal spray Place 2 sprays into both nostrils daily. 04/20/23  Yes Roseanne Juenger A, PA-C  midodrine (PROAMATINE) 10 MG tablet Take 10 mg by mouth See admin instructions. Take 1 tablet (10 mg) by mouth before dialysis & take 1 tablet (10 mg) while at dialysis on Mondays, Wednesdays & Fridays. 08/15/20  Yes [provider]  sevelamer carbonate (RENVELA) 800 MG tablet Take 2,400 mg by mouth See admin instructions. Take 3 tablets (2400 mg) by mouth after each meal and after each snack   Yes [provider]  ondansetron (ZOFRAN-ODT) 4 MG disintegrating tablet Take 1 tablet (4 mg total) by mouth every 8 (eight) hours as needed for nausea or vomiting. 10/31/22   Madelyn Tlatelpa, Elita Boone, NP  traMADol (ULTRAM) 50 MG tablet Take 1 tablet (50 mg total) by mouth every 12 (twelve) hours as needed. 12/02/22   Small, Harley [place], PA    Family History Family History  Problem Relation Age of Onset   Diabetes Mother    Hypertension Mother    Heart disease Mother    Alcohol  abuse Mother    Kidney disease Mother    Cancer Father        COLON   Alcohol abuse Father    Breast cancer Sister 93   Hypertension Sister    Kidney disease Sister    Breast cancer Sister        breast cancer   Hypertension Son    Kidney disease Son     Social History Social History   Tobacco Use   Smoking status: Never   Smokeless tobacco: Never  Vaping Use  Vaping status: Never Used  Substance Use Topics   Alcohol use: No   Drug use: No     Allergies   Iodinated contrast media, Cefazolin, Diphenhydramine, and Naproxen sodium   Review of Systems Review of Systems  Constitutional:  Negative for chills and fever.  HENT:  Positive for congestion, ear pain, nosebleeds, rhinorrhea, sinus pressure and sinus pain. Negative for sore throat.   Eyes:  Negative for pain and visual disturbance.  Respiratory:  Negative for cough and shortness of breath.   Cardiovascular:  Negative for chest pain and palpitations.  Gastrointestinal:  Negative for abdominal pain and vomiting.  Genitourinary:  Negative for dysuria and hematuria.  Musculoskeletal:  Negative for arthralgias and back pain.  Skin:  Negative for color change and rash.  Neurological:  Negative for seizures and syncope.  All other systems reviewed and are negative.    Physical Exam Triage Vital Signs ED Triage Vitals  Encounter Vitals Group     BP 04/20/23 1046 119/67     Systolic BP Percentile --      Diastolic BP Percentile --      Pulse Rate 04/20/23 1046 (!) 57     Resp 04/20/23 1046 18     Temp 04/20/23 1046 (!) 97.4 F (36.3 C)     Temp Source 04/20/23 1046 Oral     SpO2 04/20/23 1046 99 %     Weight --      Height --      Head Circumference --      Peak Flow --      Pain Score 04/20/23 1044 0     Pain Loc --      Pain Education --      Exclude from Growth Chart --    No data found.  Updated Vital Signs BP 119/67 (BP Location: Left Arm)   Pulse (!) 57   Temp (!) 97.4 F (36.3 C) (Oral)    Resp 18   SpO2 99%   Visual Acuity Right Eye Distance:   Left Eye Distance:   Bilateral Distance:    Right Eye Near:   Left Eye Near:    Bilateral Near:     Physical Exam Vitals and nursing note reviewed.  Constitutional:      General: She is not in acute distress.    Appearance: She is well-developed.  HENT:     Head: Normocephalic and atraumatic.     Right Ear: Tympanic membrane normal.     Left Ear: Tympanic membrane normal.     Nose: Nasal tenderness, congestion and rhinorrhea present.     Right Turbinates: Swollen.     Left Turbinates: Swollen.     Right Sinus: Maxillary sinus tenderness present.     Left Sinus: Maxillary sinus tenderness present.  Eyes:     Conjunctiva/sclera: Conjunctivae normal.  Cardiovascular:     Rate and Rhythm: Normal rate and regular rhythm.     Heart sounds: No murmur heard. Pulmonary:     Effort: Pulmonary effort is normal. No respiratory distress.     Breath sounds: Normal breath sounds.  Abdominal:     Palpations: Abdomen is soft.     Tenderness: There is no abdominal tenderness.  Musculoskeletal:        General: No swelling.     Cervical back: Neck supple.  Skin:    General: Skin is warm and dry.     Capillary Refill: Capillary refill takes less than 2 seconds.  Neurological:  Mental Status: She is alert.  Psychiatric:        Mood and Affect: Mood normal.      UC Treatments / Results  Labs (all labs ordered are listed, but only abnormal results are displayed) Labs Reviewed - No data to display  EKG   Radiology No results found.  Procedures Procedures (including critical care time)  Medications Ordered in UC Medications - No data to display  Initial Impression / Assessment and Plan / UC Course  I have reviewed the triage vital signs and the nursing notes.  Pertinent labs & imaging results that were available during my care of the patient were reviewed by me and considered in my medical decision making (see  chart for details).     Acute non-recurrent maxillary sinusitis   Symptoms are most consistent with a sinus infection. This is likely due to a bacterial source.  We will treat this with the following:  Azithromycin 250mg  Take 2 tablets today and the 1 tablet daily for 4 more days. Flonase 2 sprays each nostril once daily for 7 days for nasal congestion.  May use as needed after this. Drink plenty of fluids to stay hydrated.  May use a humidifier at home for the sinus congestion as well.  Return to urgent care or PCP if symptoms worsen or fail to resolve.    Final Clinical Impressions(s) / UC Diagnoses   Final diagnoses:  Acute non-recurrent maxillary sinusitis     Discharge Instructions      Symptoms are most consistent with a sinus infection. This is likely due to a bacterial source.  We will treat this with the following:  Azithromycin 250mg  Take 2 tablets today and the 1 tablet daily for 4 more days. Flonase 2 sprays each nostril once daily for 7 days for nasal congestion.  May use as needed after this. Drink plenty of fluids to stay hydrated.  May use a humidifier at home for the sinus congestion as well.  Return to urgent care or PCP if symptoms worsen or fail to resolve.      ED Prescriptions     Medication Sig Dispense Auth. Provider   azithromycin (ZITHROMAX) 250 MG tablet Take first 2 tablets together, then 1 every day until finished. 6 tablet Nesbit Michon A, PA-C   fluticasone (FLONASE) 50 MCG/ACT nasal spray Place 2 sprays into both nostrils daily. 9.9 mL Landis Martins, [place]      PDMP not reviewed this encounter.   Landis Martins, [place] 04/20/23 1116

## 2023-04-22 DIAGNOSIS — N2581 Secondary hyperparathyroidism of renal origin: Secondary | ICD-10-CM | POA: Diagnosis not present

## 2023-04-22 DIAGNOSIS — N186 End stage renal disease: Secondary | ICD-10-CM | POA: Diagnosis not present

## 2023-04-22 DIAGNOSIS — D631 Anemia in chronic kidney disease: Secondary | ICD-10-CM | POA: Diagnosis not present

## 2023-04-22 DIAGNOSIS — D509 Iron deficiency anemia, unspecified: Secondary | ICD-10-CM | POA: Diagnosis not present

## 2023-04-22 DIAGNOSIS — Z992 Dependence on renal dialysis: Secondary | ICD-10-CM | POA: Diagnosis not present

## 2023-04-24 DIAGNOSIS — N186 End stage renal disease: Secondary | ICD-10-CM | POA: Diagnosis not present

## 2023-04-24 DIAGNOSIS — N2581 Secondary hyperparathyroidism of renal origin: Secondary | ICD-10-CM | POA: Diagnosis not present

## 2023-04-24 DIAGNOSIS — D631 Anemia in chronic kidney disease: Secondary | ICD-10-CM | POA: Diagnosis not present

## 2023-04-24 DIAGNOSIS — Z992 Dependence on renal dialysis: Secondary | ICD-10-CM | POA: Diagnosis not present

## 2023-04-24 DIAGNOSIS — D509 Iron deficiency anemia, unspecified: Secondary | ICD-10-CM | POA: Diagnosis not present

## 2023-04-26 DIAGNOSIS — D509 Iron deficiency anemia, unspecified: Secondary | ICD-10-CM | POA: Diagnosis not present

## 2023-04-26 DIAGNOSIS — N2581 Secondary hyperparathyroidism of renal origin: Secondary | ICD-10-CM | POA: Diagnosis not present

## 2023-04-26 DIAGNOSIS — D631 Anemia in chronic kidney disease: Secondary | ICD-10-CM | POA: Diagnosis not present

## 2023-04-26 DIAGNOSIS — N186 End stage renal disease: Secondary | ICD-10-CM | POA: Diagnosis not present

## 2023-04-26 DIAGNOSIS — Z992 Dependence on renal dialysis: Secondary | ICD-10-CM | POA: Diagnosis not present

## 2023-04-27 DIAGNOSIS — Z992 Dependence on renal dialysis: Secondary | ICD-10-CM | POA: Diagnosis not present

## 2023-04-27 DIAGNOSIS — N186 End stage renal disease: Secondary | ICD-10-CM | POA: Diagnosis not present

## 2023-04-27 DIAGNOSIS — N039 Chronic nephritic syndrome with unspecified morphologic changes: Secondary | ICD-10-CM | POA: Diagnosis not present

## 2023-04-29 DIAGNOSIS — Z992 Dependence on renal dialysis: Secondary | ICD-10-CM | POA: Diagnosis not present

## 2023-04-29 DIAGNOSIS — N2581 Secondary hyperparathyroidism of renal origin: Secondary | ICD-10-CM | POA: Diagnosis not present

## 2023-04-29 DIAGNOSIS — D631 Anemia in chronic kidney disease: Secondary | ICD-10-CM | POA: Diagnosis not present

## 2023-04-29 DIAGNOSIS — N186 End stage renal disease: Secondary | ICD-10-CM | POA: Diagnosis not present

## 2023-05-01 DIAGNOSIS — N186 End stage renal disease: Secondary | ICD-10-CM | POA: Diagnosis not present

## 2023-05-01 DIAGNOSIS — N2581 Secondary hyperparathyroidism of renal origin: Secondary | ICD-10-CM | POA: Diagnosis not present

## 2023-05-01 DIAGNOSIS — Z992 Dependence on renal dialysis: Secondary | ICD-10-CM | POA: Diagnosis not present

## 2023-05-01 DIAGNOSIS — D631 Anemia in chronic kidney disease: Secondary | ICD-10-CM | POA: Diagnosis not present

## 2023-05-03 DIAGNOSIS — N186 End stage renal disease: Secondary | ICD-10-CM | POA: Diagnosis not present

## 2023-05-03 DIAGNOSIS — D631 Anemia in chronic kidney disease: Secondary | ICD-10-CM | POA: Diagnosis not present

## 2023-05-03 DIAGNOSIS — N2581 Secondary hyperparathyroidism of renal origin: Secondary | ICD-10-CM | POA: Diagnosis not present

## 2023-05-03 DIAGNOSIS — Z992 Dependence on renal dialysis: Secondary | ICD-10-CM | POA: Diagnosis not present

## 2023-05-04 ENCOUNTER — Ambulatory Visit (HOSPITAL_COMMUNITY)
Admission: EM | Admit: 2023-05-04 | Discharge: 2023-05-04 | Disposition: A | Discharge status: Home / Self Care | Attending: Physician Assistant | Admitting: Physician Assistant

## 2023-05-04 ENCOUNTER — Encounter (HOSPITAL_COMMUNITY): Payer: Self-pay | Admitting: Emergency Medicine

## 2023-05-04 ENCOUNTER — Other Ambulatory Visit: Payer: Self-pay

## 2023-05-04 DIAGNOSIS — J0191 Acute recurrent sinusitis, unspecified: Secondary | ICD-10-CM

## 2023-05-04 MED ORDER — DOXYCYCLINE HYCLATE 100 MG PO CAPS: 100.0000 mg | ORAL_CAPSULE | Freq: Two times a day (BID) | ORAL | 0 refills | Status: DC

## 2023-05-04 NOTE — ED Triage Notes (Signed)
 Pt c/o cold like symptoms and sinus infection over a week, finish treatment with no relief.

## 2023-05-04 NOTE — Discharge Instructions (Signed)
 Take antibiotic as prescribed. Resume Flonase. Recommend Mucinex. Drink plenty of fluids. Can take ibuprofen or Tylenol as needed

## 2023-05-04 NOTE — ED Provider Notes (Signed)
 [facility]    CSN: [id number] Arrival date & time: 05/04/23  1033      History   Chief Complaint Chief Complaint  Patient presents with   URI    HPI [name] Marks is a 68 y.o. female.   Patient presents with sinus pressure and pain, congestion that has been going on for about 3 weeks.  She was seen here at her UC and treated for acute sinusitis with azithromycin.  She reports she has finished the antibiotic with minimal relief.  States she did have about a week where she thought her symptoms were feeling better but now she is having increased left facial pain and pressure along with left ear pain.  Denies fever, chills.  She was previously using Flonase but has discontinued that when she completed her antibiotic.    Past Medical History:  Diagnosis Date   Anemia    Aortic valve prosthesis present 02/25/2016   Arthritis    "knees" (01/03/2017)   AVD (aortic valve disease) 07/12/2016   Backache 12/06/2008   Bacteremia due to coagulase-negative Staphylococcus    Carpal tunnel syndrome    Cerebral embolism with transient ischemic attack (TIA)    Cholelithiases 01/28/2017   Chronic female pelvic pain 08/08/2012   Complication of anesthesia    01/01/17- '"a long time ago, difficulty breathimg, not sure if it was due to anesthesia or not."   CVA (cerebral vascular accident) (HCC) 05/16/2016   End stage renal disease on dialysis (HCC)    "MWF; [address] (01/03/2017)   Endocarditis    Esophageal reflux 12/06/2008   GERD (gastroesophageal reflux disease)    Gout    History of blood transfusion 2017   "related to blood poison" (01/03/2017)   Increased endometrial stripe thickness 11/03/2015   Neck pain    Osteoporosis 09/2016   T score -2.6   Pain and swelling of right upper extremity 12/20/2015   Pulmonary HTN (HCC) 07/12/2016   Renal dialysis device, implant, or graft complication 12/20/2015   Renal insufficiency    S/P cholecystectomy 02/14/2017   Septic shock  (HCC)    Staphylococcus aureus bacteremia    TIA (transient ischemic attack)    "several" (01/03/2017)    Patient Active Problem List   Diagnosis Date Noted   History of colonic polyps 05/16/2021   Endocarditis 07/30/2020   Hyperparathyroidism due to renal insufficiency (HCC) 07/30/2020   Tooth, broken 12/18/2018   Hypotension 10/03/2018   Diarrhea, unspecified 08/25/2018   Orthostatic hypotension 03/14/2018   S/P cholecystectomy 02/14/2017   Osteoporosis 12/11/2016   Cholelithiasis 11/07/2016   Cerebral embolism with transient ischemic attack (TIA)    Bilateral carpal tunnel syndrome 08/09/2016   AVD (aortic valve disease) 07/12/2016   Pulmonary HTN (HCC) 07/12/2016   Carpal tunnel syndrome 07/05/2016   Disorder of both mastoids 05/29/2016   CVA (cerebral vascular accident) (HCC) 05/16/2016   History of aortic valve replacement with bioprosthetic valve 05/08/2016   Bacteremia due to coagulase-negative Staphylococcus    History of endocarditis    Anemia    Complex endometrial hyperplasia with atypia 11/08/2015   Dependence on renal dialysis (HCC) 12/15/2014   Obesity 10/28/2014   Right carotid bruit 01/08/2013   Generalized headaches 01/08/2013   Chronic female pelvic pain 08/08/2012   Gout, unspecified 12/06/2008   Esophageal reflux 12/06/2008   Backache 12/06/2008   FIBROIDS, UTERUS 11/24/2008   Diverticulosis of large intestine without perforation or abscess without bleeding 03/22/2008   ESRD (end stage renal  disease) on dialysis (HCC) 07/01/2006   Anemia in chronic kidney disease 12/18/2001    Past Surgical History:  Procedure Laterality Date   AORTIC VALVE REPLACEMENT N/A 02/25/2016   Procedure: AORTIC VALVE REPLACEMENT (AVR) implanted with Magna Ease Aortic valve size 21mm;  Surgeon: Loreli Slot, MD;  Location: [facility] OR;  Service: Open Heart Surgery;  Laterality: N/A;   AV FISTULA PLACEMENT Left 01/03/2017   Procedure: INSERTION OF ARTERIOVENOUS (AV)  GORE-TEX GRAFT LEFT THIGH;  Surgeon: Nada Libman, MD;  Location: [facility] OR;  Service: Vascular;  Laterality: Left;   AVGG REMOVAL Right 02/17/2016   Procedure: REMOVAL OF TWO ARTERIOVENOUS GORETEX GRAFTS (AVGG);  Surgeon: Chuck Hint, MD;  Location: [facility] OR;  Service: Vascular;  Laterality: Right;   BIOPSY  05/16/2021   Procedure: BIOPSY;  Surgeon: Charlott Rakes, MD;  Location: [facility];  Service: Endoscopy;;   BREAST BIOPSY Left 2013   stereo    BREAST BIOPSY Right 2011   stereo    CARDIAC VALVE REPLACEMENT     CHOLECYSTECTOMY N/A 01/30/2017   Procedure: LAPAROSCOPIC CHOLECYSTECTOMY;  Surgeon: Harriette Bouillon, MD;  Location: [facility] OR;  Service: General;  Laterality: N/A;   COLONOSCOPY W/ POLYPECTOMY     COLONOSCOPY WITH PROPOFOL N/A 05/16/2021   Procedure: COLONOSCOPY WITH PROPOFOL;  Surgeon: Charlott Rakes, MD;  Location: [facility];  Service: Endoscopy;  Laterality: N/A;   DG AV DIALYSIS GRAFT DECLOT OR     DILATATION & CURETTAGE/HYSTEROSCOPY WITH TRUECLEAR N/A 11/06/2012   Procedure: DILATATION & CURETTAGE/HYSTEROSCOPY WITH TRUECLEAR;  Surgeon: Ok Edwards, MD;  Location: [facility] ORS;  Service: Gynecology;  Laterality: N/A;  Truclear Resectoscopic Polypectomy    INSERTION OF DIALYSIS CATHETER N/A 02/19/2016   Procedure: INSERTION OF Left Internal Jugular DIALYSIS CATHETER;  Surgeon: Chuck Hint, MD;  Location: [facility] OR;  Service: Vascular;  Laterality: N/A;   LOOP RECORDER INSERTION N/A 10/01/2016   Procedure: LOOP RECORDER INSERTION;  Surgeon: Regan Lemming, MD;  Location: [facility];  Service: Cardiovascular;  Laterality: N/A;   PATCH ANGIOPLASTY Right 02/17/2016   Procedure: PATCH ANGIOPLASTY;  Surgeon: Chuck Hint, MD;  Location: [facility] OR;  Service: Vascular;  Laterality: Right;   PERIPHERAL VASCULAR CATHETERIZATION N/A 09/14/2014   Procedure: A/V Shuntogram/Fistulagram;  Surgeon: Renford Dills, MD;  Location: [facility];   Service: Cardiovascular;  Laterality: N/A;   PERIPHERAL VASCULAR CATHETERIZATION N/A 09/14/2014   Procedure: A/V Shunt Intervention;  Surgeon: Renford Dills, MD;  Location: [facility];  Service: Cardiovascular;  Laterality: N/A;   PERIPHERAL VASCULAR CATHETERIZATION Right 12/09/2014   Procedure: A/V Shuntogram/Fistulagram;  Surgeon: Annice Needy, MD;  Location: [facility];  Service: Cardiovascular;  Laterality: Right;   PERIPHERAL VASCULAR CATHETERIZATION N/A 12/09/2014   Procedure: A/V Shunt Intervention;  Surgeon: Annice Needy, MD;  Location: [facility];  Service: Cardiovascular;  Laterality: N/A;   PERIPHERAL VASCULAR CATHETERIZATION Right 05/24/2015   Procedure: A/V Shuntogram;  Surgeon: Nada Libman, MD;  Location: [facility];  Service: Cardiovascular;  Laterality: Right;   PERIPHERAL VASCULAR CATHETERIZATION Right 05/24/2015   Procedure: Peripheral Vascular Balloon Angioplasty;  Surgeon: Nada Libman, MD;  Location: [facility];  Service: Cardiovascular;  Laterality: Right;  right arm shunt   PERIPHERAL VASCULAR CATHETERIZATION N/A 06/13/2015   Procedure: A/V Shuntogram/Fistulagram;  Surgeon: Annice Needy, MD;  Location: [facility];  Service: Cardiovascular;  Laterality: N/A;   PERIPHERAL  VASCULAR CATHETERIZATION N/A 06/13/2015   Procedure: A/V Shunt Intervention;  Surgeon: Annice Needy, MD;  Location: [facility];  Service: Cardiovascular;  Laterality: N/A;   TEE WITHOUT CARDIOVERSION N/A 02/22/2016   Procedure: TRANSESOPHAGEAL ECHOCARDIOGRAM (TEE);  Surgeon: Chrystie Nose, MD;  Location: [facility] ENDOSCOPY;  Service: Cardiovascular;  Laterality: N/A;   TEE WITHOUT CARDIOVERSION N/A 02/25/2016   Procedure: TRANSESOPHAGEAL ECHOCARDIOGRAM (TEE);  Surgeon: Loreli Slot, MD;  Location: [facility] OR;  Service: Open Heart Surgery;  Laterality: N/A;   TEE WITHOUT CARDIOVERSION N/A 10/01/2016   Procedure: TRANSESOPHAGEAL ECHOCARDIOGRAM  (TEE);  Surgeon: Chrystie Nose, MD;  Location: [facility] ENDOSCOPY;  Service: Cardiovascular;  Laterality: N/A;   TUBAL LIGATION  1983    OB History     Gravida  2   Para  2   Term  2   Preterm      AB      Living  2      SAB      IAB      Ectopic      Multiple      Live Births               Home Medications    Prior to Admission medications   Medication Sig Start Date End Date Taking? Authorizing Provider  doxycycline (VIBRAMYCIN) 100 MG capsule Take 1 capsule (100 mg total) by mouth 2 (two) times daily. 05/04/23  Yes Ward, Tylene Fantasia, PA-C  azithromycin (ZITHROMAX) 250 MG tablet Take first 2 tablets together, then 1 every day until finished. 04/20/23   Landis Martins, PA-C  cinacalcet (SENSIPAR) 90 MG tablet Take 90 mg by mouth at bedtime.    [provider]  clopidogrel (PLAVIX) 75 MG tablet Take 1 tablet (75 mg total) by mouth daily. 05/15/22   Sharlene Dory, PA-C  famotidine (PEPCID) 20 MG tablet Take 20 mg by mouth daily after supper.    [provider]  fluticasone (FLONASE) 50 MCG/ACT nasal spray Place 2 sprays into both nostrils daily. 04/20/23   Doristine Mango A, PA-C  midodrine (PROAMATINE) 10 MG tablet Take 10 mg by mouth See admin instructions. Take 1 tablet (10 mg) by mouth before dialysis & take 1 tablet (10 mg) while at dialysis on Mondays, Wednesdays & Fridays. 08/15/20   [provider]  ondansetron (ZOFRAN-ODT) 4 MG disintegrating tablet Take 1 tablet (4 mg total) by mouth every 8 (eight) hours as needed for nausea or vomiting. 10/31/22   Valinda Hoar, NP  sevelamer carbonate (RENVELA) 800 MG tablet Take 2,400 mg by mouth See admin instructions. Take 3 tablets (2400 mg) by mouth after each meal and after each snack    [provider]  traMADol (ULTRAM) 50 MG tablet Take 1 tablet (50 mg total) by mouth every 12 (twelve) hours as needed. 12/02/22   Small, Harley [place], PA    Family History Family History  Problem  Relation Age of Onset   Diabetes Mother    Hypertension Mother    Heart disease Mother    Alcohol abuse Mother    Kidney disease Mother    Cancer Father        COLON   Alcohol abuse Father    Breast cancer Sister 30   Hypertension Sister    Kidney disease Sister    Breast cancer Sister        breast cancer   Hypertension Son    Kidney disease Son  Social History Social History   Tobacco Use   Smoking status: Never   Smokeless tobacco: Never  Vaping Use   Vaping status: Never Used  Substance Use Topics   Alcohol use: No   Drug use: No     Allergies   Iodinated contrast media, Cefazolin, Diphenhydramine, and Naproxen sodium   Review of Systems Review of Systems  Constitutional:  Negative for chills and fever.  HENT:  Positive for ear pain, sinus pressure and sinus pain. Negative for sore throat.   Eyes:  Negative for pain and visual disturbance.  Respiratory:  Negative for cough and shortness of breath.   Cardiovascular:  Negative for chest pain and palpitations.  Gastrointestinal:  Negative for abdominal pain and vomiting.  Genitourinary:  Negative for dysuria and hematuria.  Musculoskeletal:  Negative for arthralgias and back pain.  Skin:  Negative for color change and rash.  Neurological:  Negative for seizures and syncope.  All other systems reviewed and are negative.    Physical Exam Triage Vital Signs ED Triage Vitals  Encounter Vitals Group     BP 05/04/23 1113 111/69     Systolic BP Percentile --      Diastolic BP Percentile --      Pulse Rate 05/04/23 1113 (!) 52     Resp 05/04/23 1113 18     Temp 05/04/23 1113 97.9 F (36.6 C)     Temp Source 05/04/23 1113 Oral     SpO2 05/04/23 1113 96 %     Weight --      Height --      Head Circumference --      Peak Flow --      Pain Score 05/04/23 1116 4     Pain Loc --      Pain Education --      Exclude from Growth Chart --    No data found.  Updated Vital Signs BP 111/69 (BP Location:  Right Arm)   Pulse (!) 52   Temp 97.9 F (36.6 C) (Oral)   Resp 18   SpO2 96%   Visual Acuity Right Eye Distance:   Left Eye Distance:   Bilateral Distance:    Right Eye Near:   Left Eye Near:    Bilateral Near:     Physical Exam Vitals and nursing note reviewed.  Constitutional:      General: She is not in acute distress.    Appearance: She is well-developed.  HENT:     Head: Normocephalic and atraumatic.  Eyes:     Conjunctiva/sclera: Conjunctivae normal.  Cardiovascular:     Rate and Rhythm: Normal rate and regular rhythm.     Heart sounds: No murmur heard. Pulmonary:     Effort: Pulmonary effort is normal. No respiratory distress.     Breath sounds: Normal breath sounds.  Abdominal:     Palpations: Abdomen is soft.     Tenderness: There is no abdominal tenderness.  Musculoskeletal:        General: No swelling.     Cervical back: Neck supple.  Skin:    General: Skin is warm and dry.     Capillary Refill: Capillary refill takes less than 2 seconds.  Neurological:     Mental Status: She is alert.  Psychiatric:        Mood and Affect: Mood normal.      UC Treatments / Results  Labs (all labs ordered are listed, but only abnormal results are displayed) Labs Reviewed -  No data to display  EKG   Radiology No results found.  Procedures Procedures (including critical care time)  Medications Ordered in UC Medications - No data to display  Initial Impression / Assessment and Plan / UC Course  I have reviewed the triage vital signs and the nursing notes.  Pertinent labs & imaging results that were available during my care of the patient were reviewed by me and considered in my medical decision making (see chart for details).     Will treat with recurrent sinusitis.  Advised to continue Flonase.  Supportive care discussed.  Return precautions discussed. Final Clinical Impressions(s) / UC Diagnoses   Final diagnoses:  Acute recurrent sinusitis,  unspecified location     Discharge Instructions      Take antibiotic as prescribed. Resume Flonase. Recommend Mucinex. Drink plenty of fluids. Can take ibuprofen or Tylenol as needed     ED Prescriptions     Medication Sig Dispense Auth. Provider   doxycycline (VIBRAMYCIN) 100 MG capsule Take 1 capsule (100 mg total) by mouth 2 (two) times daily. 20 capsule Ward, Tylene Fantasia, PA-C      PDMP not reviewed this encounter.   Ward, Tylene Fantasia, PA-C 05/04/23 1214

## 2023-05-06 DIAGNOSIS — D631 Anemia in chronic kidney disease: Secondary | ICD-10-CM | POA: Diagnosis not present

## 2023-05-06 DIAGNOSIS — Z992 Dependence on renal dialysis: Secondary | ICD-10-CM | POA: Diagnosis not present

## 2023-05-06 DIAGNOSIS — N186 End stage renal disease: Secondary | ICD-10-CM | POA: Diagnosis not present

## 2023-05-06 DIAGNOSIS — N2581 Secondary hyperparathyroidism of renal origin: Secondary | ICD-10-CM | POA: Diagnosis not present

## 2023-05-08 DIAGNOSIS — D631 Anemia in chronic kidney disease: Secondary | ICD-10-CM | POA: Diagnosis not present

## 2023-05-08 DIAGNOSIS — N186 End stage renal disease: Secondary | ICD-10-CM | POA: Diagnosis not present

## 2023-05-08 DIAGNOSIS — Z992 Dependence on renal dialysis: Secondary | ICD-10-CM | POA: Diagnosis not present

## 2023-05-08 DIAGNOSIS — N2581 Secondary hyperparathyroidism of renal origin: Secondary | ICD-10-CM | POA: Diagnosis not present

## 2023-05-10 DIAGNOSIS — Z992 Dependence on renal dialysis: Secondary | ICD-10-CM | POA: Diagnosis not present

## 2023-05-10 DIAGNOSIS — N186 End stage renal disease: Secondary | ICD-10-CM | POA: Diagnosis not present

## 2023-05-10 DIAGNOSIS — N2581 Secondary hyperparathyroidism of renal origin: Secondary | ICD-10-CM | POA: Diagnosis not present

## 2023-05-10 DIAGNOSIS — D631 Anemia in chronic kidney disease: Secondary | ICD-10-CM | POA: Diagnosis not present

## 2023-05-13 DIAGNOSIS — N186 End stage renal disease: Secondary | ICD-10-CM | POA: Diagnosis not present

## 2023-05-13 DIAGNOSIS — N2581 Secondary hyperparathyroidism of renal origin: Secondary | ICD-10-CM | POA: Diagnosis not present

## 2023-05-13 DIAGNOSIS — Z992 Dependence on renal dialysis: Secondary | ICD-10-CM | POA: Diagnosis not present

## 2023-05-13 DIAGNOSIS — D631 Anemia in chronic kidney disease: Secondary | ICD-10-CM | POA: Diagnosis not present

## 2023-05-15 DIAGNOSIS — N2581 Secondary hyperparathyroidism of renal origin: Secondary | ICD-10-CM | POA: Diagnosis not present

## 2023-05-15 DIAGNOSIS — Z992 Dependence on renal dialysis: Secondary | ICD-10-CM | POA: Diagnosis not present

## 2023-05-15 DIAGNOSIS — D631 Anemia in chronic kidney disease: Secondary | ICD-10-CM | POA: Diagnosis not present

## 2023-05-15 DIAGNOSIS — N186 End stage renal disease: Secondary | ICD-10-CM | POA: Diagnosis not present

## 2023-05-17 ENCOUNTER — Telehealth (HOSPITAL_COMMUNITY): Payer: Self-pay | Admitting: Cardiology

## 2023-05-17 DIAGNOSIS — N186 End stage renal disease: Secondary | ICD-10-CM | POA: Diagnosis not present

## 2023-05-17 DIAGNOSIS — N2581 Secondary hyperparathyroidism of renal origin: Secondary | ICD-10-CM | POA: Diagnosis not present

## 2023-05-17 DIAGNOSIS — D631 Anemia in chronic kidney disease: Secondary | ICD-10-CM | POA: Diagnosis not present

## 2023-05-17 DIAGNOSIS — Z992 Dependence on renal dialysis: Secondary | ICD-10-CM | POA: Diagnosis not present

## 2023-05-17 NOTE — Telephone Encounter (Signed)
 Front office left patient a voice message on the telephone to make a f/u appt w/ Dr. Elwyn Lade.  Patient is due for a 6 mo f/u appt w/ Dr. Elwyn Lade in April 2025.

## 2023-05-20 DIAGNOSIS — D631 Anemia in chronic kidney disease: Secondary | ICD-10-CM | POA: Diagnosis not present

## 2023-05-20 DIAGNOSIS — N2581 Secondary hyperparathyroidism of renal origin: Secondary | ICD-10-CM | POA: Diagnosis not present

## 2023-05-20 DIAGNOSIS — N186 End stage renal disease: Secondary | ICD-10-CM | POA: Diagnosis not present

## 2023-05-20 DIAGNOSIS — Z992 Dependence on renal dialysis: Secondary | ICD-10-CM | POA: Diagnosis not present

## 2023-05-22 DIAGNOSIS — N186 End stage renal disease: Secondary | ICD-10-CM | POA: Diagnosis not present

## 2023-05-22 DIAGNOSIS — Z992 Dependence on renal dialysis: Secondary | ICD-10-CM | POA: Diagnosis not present

## 2023-05-22 DIAGNOSIS — D631 Anemia in chronic kidney disease: Secondary | ICD-10-CM | POA: Diagnosis not present

## 2023-05-22 DIAGNOSIS — N2581 Secondary hyperparathyroidism of renal origin: Secondary | ICD-10-CM | POA: Diagnosis not present

## 2023-05-23 ENCOUNTER — Encounter (HOSPITAL_COMMUNITY): Payer: Self-pay

## 2023-05-23 ENCOUNTER — Emergency Department (HOSPITAL_COMMUNITY)
Admission: EM | Admit: 2023-05-23 | Discharge: 2023-05-23 | Disposition: A | Discharge status: Home / Self Care | Attending: Emergency Medicine | Admitting: Emergency Medicine

## 2023-05-23 ENCOUNTER — Other Ambulatory Visit: Payer: Self-pay

## 2023-05-23 ENCOUNTER — Emergency Department (HOSPITAL_COMMUNITY)

## 2023-05-23 DIAGNOSIS — R059 Cough, unspecified: Secondary | ICD-10-CM | POA: Diagnosis not present

## 2023-05-23 DIAGNOSIS — Z955 Presence of coronary angioplasty implant and graft: Secondary | ICD-10-CM | POA: Insufficient documentation

## 2023-05-23 DIAGNOSIS — D649 Anemia, unspecified: Secondary | ICD-10-CM | POA: Insufficient documentation

## 2023-05-23 DIAGNOSIS — M79641 Pain in right hand: Secondary | ICD-10-CM | POA: Diagnosis not present

## 2023-05-23 DIAGNOSIS — R051 Acute cough: Secondary | ICD-10-CM | POA: Diagnosis not present

## 2023-05-23 DIAGNOSIS — J0100 Acute maxillary sinusitis, unspecified: Secondary | ICD-10-CM | POA: Diagnosis not present

## 2023-05-23 DIAGNOSIS — Z992 Dependence on renal dialysis: Secondary | ICD-10-CM | POA: Diagnosis not present

## 2023-05-23 DIAGNOSIS — Z7902 Long term (current) use of antithrombotics/antiplatelets: Secondary | ICD-10-CM | POA: Insufficient documentation

## 2023-05-23 DIAGNOSIS — M79642 Pain in left hand: Secondary | ICD-10-CM | POA: Diagnosis not present

## 2023-05-23 DIAGNOSIS — R079 Chest pain, unspecified: Secondary | ICD-10-CM | POA: Diagnosis not present

## 2023-05-23 DIAGNOSIS — R52 Pain, unspecified: Secondary | ICD-10-CM

## 2023-05-23 DIAGNOSIS — M792 Neuralgia and neuritis, unspecified: Secondary | ICD-10-CM | POA: Diagnosis not present

## 2023-05-23 DIAGNOSIS — N186 End stage renal disease: Secondary | ICD-10-CM | POA: Insufficient documentation

## 2023-05-23 DIAGNOSIS — I700 Atherosclerosis of aorta: Secondary | ICD-10-CM | POA: Diagnosis not present

## 2023-05-23 DIAGNOSIS — M791 Myalgia, unspecified site: Secondary | ICD-10-CM | POA: Insufficient documentation

## 2023-05-23 LAB — CBC WITH DIFFERENTIAL/PLATELET
Abs Immature Granulocytes: 0.01 10*3/uL (ref 0.00–0.07)
Basophils Absolute: 0 10*3/uL (ref 0.0–0.1)
Basophils Relative: 0 %
Eosinophils Absolute: 0.3 10*3/uL (ref 0.0–0.5)
Eosinophils Relative: 5 %
HCT: 35.5 % — ABNORMAL LOW (ref 36.0–46.0)
Hemoglobin: 11 g/dL — ABNORMAL LOW (ref 12.0–15.0)
Immature Granulocytes: 0 %
Lymphocytes Relative: 30 %
Lymphs Abs: 1.5 10*3/uL (ref 0.7–4.0)
MCH: 29.3 pg (ref 26.0–34.0)
MCHC: 31 g/dL (ref 30.0–36.0)
MCV: 94.4 fL (ref 80.0–100.0)
Monocytes Absolute: 0.4 10*3/uL (ref 0.1–1.0)
Monocytes Relative: 8 %
Neutro Abs: 2.6 10*3/uL (ref 1.7–7.7)
Neutrophils Relative %: 57 %
Platelets: 116 10*3/uL — ABNORMAL LOW (ref 150–400)
RBC: 3.76 MIL/uL — ABNORMAL LOW (ref 3.87–5.11)
RDW: 16.2 % — ABNORMAL HIGH (ref 11.5–15.5)
WBC: 4.8 10*3/uL (ref 4.0–10.5)
nRBC: 0 % (ref 0.0–0.2)

## 2023-05-23 LAB — COMPREHENSIVE METABOLIC PANEL WITH GFR
ALT: 10 U/L (ref 0–44)
AST: 21 U/L (ref 15–41)
Albumin: 3.6 g/dL (ref 3.5–5.0)
Alkaline Phosphatase: 132 U/L — ABNORMAL HIGH (ref 38–126)
Anion gap: 10 (ref 5–15)
BUN: 17 mg/dL (ref 8–23)
CO2: 25 mmol/L (ref 22–32)
Calcium: 10.6 mg/dL — ABNORMAL HIGH (ref 8.9–10.3)
Chloride: 101 mmol/L (ref 98–111)
Creatinine, Ser: 5.52 mg/dL — ABNORMAL HIGH (ref 0.44–1.00)
GFR, Estimated: 8 mL/min — ABNORMAL LOW (ref >60)
Glucose, Bld: 87 mg/dL (ref 70–99)
Potassium: 4.7 mmol/L (ref 3.5–5.1)
Sodium: 136 mmol/L (ref 135–145)
Total Bilirubin: 0.9 mg/dL (ref 0.0–1.2)
Total Protein: 7.2 g/dL (ref 6.5–8.1)

## 2023-05-23 LAB — I-STAT CHEM 8, ED
BUN: 15 mg/dL (ref 8–23)
Calcium, Ion: 1.26 mmol/L (ref 1.15–1.40)
Chloride: 103 mmol/L (ref 98–111)
Creatinine, Ser: 6.1 mg/dL — ABNORMAL HIGH (ref 0.44–1.00)
Glucose, Bld: 83 mg/dL (ref 70–99)
HCT: 35 % — ABNORMAL LOW (ref 36.0–46.0)
Hemoglobin: 11.9 g/dL — ABNORMAL LOW (ref 12.0–15.0)
Potassium: 4.5 mmol/L (ref 3.5–5.1)
Sodium: 136 mmol/L (ref 135–145)
TCO2: 24 mmol/L (ref 22–32)

## 2023-05-23 LAB — CK: Total CK: 46 U/L (ref 38–234)

## 2023-05-23 LAB — PHOSPHORUS: Phosphorus: 3.9 mg/dL (ref 2.5–4.6)

## 2023-05-23 LAB — MAGNESIUM: Magnesium: 2.1 mg/dL (ref 1.7–2.4)

## 2023-05-23 MED ORDER — ACETAMINOPHEN 500 MG PO TABS
500.0000 mg | ORAL_TABLET | Freq: Once | ORAL | Status: AC
  Administered 2023-05-23: 500 mg via ORAL
  Filled 2023-05-23: qty 1

## 2023-05-23 MED ORDER — ACETAMINOPHEN 500 MG PO TABS
500.0000 mg | ORAL_TABLET | Freq: Once | ORAL | Status: DC
  Filled 2023-05-23: qty 1

## 2023-05-23 NOTE — ED Triage Notes (Signed)
 Patient stated she has body pains all over, has been going on for 2 months. Was sent from urgent care for blood work. Complaining of bilateral hand, bilateral leg, back pain, bilateral shoulder pain. Feels weak in both her arms, unable to lift above her chest.

## 2023-05-23 NOTE — ED Provider Notes (Signed)
 [place] EMERGENCY DEPARTMENT AT [facility] Provider Note   CSN: [id number] Arrival date & time: 05/23/23  1304     History Chief Complaint  Patient presents with   Body Pain    [name] Marks is a 68 y.o. female  with h/o ESRD on MWF dialysis, CABG presents to the ER today for evaluation of multiple complaints. She was sent over from urgent care today for further evaluations. She reports that she has been having diffuse bodyaches and joint pain for the past two months. She reports that the cold weather makes this worse and has been wearing gloves as the warmth doesn't make her hands hurt as bad. The patient reports that she has been told that she has arthritis in her hands and shoulders before. She reports that she is having pain in her shoulders, hands, and bilateral legs mainly. She denies any swelling, color, or temperature changes to these areas. Nursing note mentions that she is unable to lift her arms above her chest, however this is secondary to pain. She denies any numbness or tingling to the areas. She reports that Tylenol does help some with the pain. Additionally, she mentions that for a similar timeframe she has been having bilateral calf pain with ambulation, and improves with rest. Denies any radiating pain or swelling. Denies any chest pain or SOB. Additionally, she mentions that she has been having some rhinorrhea, nasal congestion, and some maxillary sinus pressure for the past 6 weeks and has been on two antibiotics for this. She reports that she starts to feel better while on the antibiotic, however when she finishes, her symptoms resume. Denis any fevers, cough, headaches, or neck stiffness. She no longer makes urine. The patient reports compliancy to her dialysis sessions and her previous one was uncomplicated. She does not feel like they are pulling too much off at dialysis to cause these symptoms. Denies any tobacco, EtOH, or illicit drug use. She ambulates with a  cane at baseline.   UC note mentions acute cough, however the patient reports that she has not had a cough during this timeframe.    HPI     Home Medications Prior to Admission medications   Medication Sig Start Date End Date Taking? Authorizing Provider  azithromycin (ZITHROMAX) 250 MG tablet Take first 2 tablets together, then 1 every day until finished. 04/20/23   Landis Martins, PA-C  cinacalcet (SENSIPAR) 90 MG tablet Take 90 mg by mouth at bedtime.    [provider]  clopidogrel (PLAVIX) 75 MG tablet Take 1 tablet (75 mg total) by mouth daily. 05/15/22   Sharlene Dory, PA-C  doxycycline (VIBRAMYCIN) 100 MG capsule Take 1 capsule (100 mg total) by mouth 2 (two) times daily. 05/04/23   Ward, Tylene Fantasia, PA-C  famotidine (PEPCID) 20 MG tablet Take 20 mg by mouth daily after supper.    [provider]  fluticasone (FLONASE) 50 MCG/ACT nasal spray Place 2 sprays into both nostrils daily. 04/20/23   Doristine Mango A, PA-C  midodrine (PROAMATINE) 10 MG tablet Take 10 mg by mouth See admin instructions. Take 1 tablet (10 mg) by mouth before dialysis & take 1 tablet (10 mg) while at dialysis on Mondays, Wednesdays & Fridays. 08/15/20   [provider]  ondansetron (ZOFRAN-ODT) 4 MG disintegrating tablet Take 1 tablet (4 mg total) by mouth every 8 (eight) hours as needed for nausea or vomiting. 10/31/22   Valinda Hoar, NP  sevelamer carbonate (RENVELA) 800 MG  tablet Take 2,400 mg by mouth See admin instructions. Take 3 tablets (2400 mg) by mouth after each meal and after each snack    [provider]  traMADol (ULTRAM) 50 MG tablet Take 1 tablet (50 mg total) by mouth every 12 (twelve) hours as needed. 12/02/22   Small, Brooke L, PA      Allergies    Iodinated contrast media, Cefazolin, Diphenhydramine, and Naproxen sodium    Review of Systems   Review of Systems  Constitutional:  Negative for chills and fever.  HENT:  Positive for congestion,  rhinorrhea and sinus pressure. Negative for ear pain and sore throat.   Respiratory:  Negative for cough and shortness of breath.   Cardiovascular:  Negative for chest pain, palpitations and leg swelling.  Gastrointestinal:  Negative for abdominal pain.  Musculoskeletal:  Positive for arthralgias and myalgias.  Neurological:  Negative for headaches.    See HPI  Physical Exam Updated Vital Signs BP (!) 102/46 (BP Location: Right Arm)   Pulse 68   Temp 97.6 F (36.4 C) (Oral)   Resp 20   Ht 4\' 11"  (1.499 m)   Wt 64 kg   SpO2 100%   BMI 28.48 kg/m  Physical Exam Vitals and nursing note reviewed.  Constitutional:      General: She is not in acute distress.    Appearance: She is not toxic-appearing.  HENT:     Nose: Nose normal.     Mouth/Throat:     Mouth: Mucous membranes are moist.  Eyes:     General: No scleral icterus. Cardiovascular:     Rate and Rhythm: Normal rate.     Pulses:          Radial pulses are 2+ on the right side and 2+ on the left side.       Dorsalis pedis pulses are 2+ on the right side and 2+ on the left side.       Posterior tibial pulses are 2+ on the right side and 2+ on the left side.     Comments: DP and PT pulses 2+ and easily palpated.  Pulmonary:     Effort: Pulmonary effort is normal. No respiratory distress.  Musculoskeletal:     Right lower leg: No edema.     Left lower leg: No edema.     Comments: No lower leg edema appreciated. Legs appear and feel symmetric size, color, and temperature nontender to palpation.  Compartments are soft.  Strength is intact and symmetric.  She has no tenderness to her lower extremities.  She does have some pain with range of motion of her shoulders.  Grip strength is equal however symmetrically weak given patient's pain in her hands.  She has brisk cap refill in all 10 fingers.  Palpable radial pulses however slightly more weak on the right, likely due to patient's fistula.  There is no significant swelling,  erythema, or color changes.  No temperature changes appreciated. Generalized weakness in upper and lower extremities, however is symmetric.   Skin:    General: Skin is warm and dry.  Neurological:     General: No focal deficit present.     Mental Status: She is alert.     ED Results / Procedures / Treatments   Labs (all labs ordered are listed, but only abnormal results are displayed) Labs Reviewed  CBC WITH DIFFERENTIAL/PLATELET - Abnormal; Notable for the following components:      Result Value   RBC 3.76 (*)  Hemoglobin 11.0 (*)    HCT 35.5 (*)    RDW 16.2 (*)    Platelets 116 (*)    All other components within normal limits  COMPREHENSIVE METABOLIC PANEL WITH GFR - Abnormal; Notable for the following components:   Creatinine, Ser 5.52 (*)    Calcium 10.6 (*)    Alkaline Phosphatase 132 (*)    GFR, Estimated 8 (*)    All other components within normal limits  I-STAT CHEM 8, ED - Abnormal; Notable for the following components:   Creatinine, Ser 6.10 (*)    Hemoglobin 11.9 (*)    HCT 35.0 (*)    All other components within normal limits  MAGNESIUM  PHOSPHORUS  CK    EKG None  Radiology DG Chest 2 View Result Date: 05/23/2023 CLINICAL DATA:  Cough, body pain all over. EXAM: CHEST - 2 VIEW COMPARISON:  11/29/2021. FINDINGS: The heart size and mediastinal contours are stable. There is atherosclerotic calcification of the aorta. A loop recorder device is present over the left chest. A prosthetic cardiac valve is noted and sternotomy wires are noted over the midline. Subclavian vascular stent is noted on the right. Axillary stents are present bilaterally. No consolidation, effusion, or pneumothorax. No acute osseous abnormality. IMPRESSION: Stable chest with no acute cardiopulmonary process. Electronically Signed   By: Thornell Sartorius M.D.   On: 05/23/2023 20:22    Procedures Procedures   Medications Ordered in ED Medications  acetaminophen (TYLENOL) tablet 500 mg (500  mg Oral Not Given 05/23/23 1902)  acetaminophen (TYLENOL) tablet 500 mg (500 mg Oral Given 05/23/23 1848)    ED Course/ Medical Decision Making/ A&P   {                              Medical Decision Making Amount and/or Complexity of Data Reviewed Labs: ordered. Radiology: ordered.  Risk OTC drugs.   68 y.o. female presents to the ER for evaluation of body aches, bilateral hand pain, bilateral calf pain, rhinorrhea and nasal congestion. Differential diagnosis includes but is not limited to rheumatologic, arthritis, polymyositis, rhabdomyolysis, electrolyte abnormality, ischemic limb, DVT. Vital signs mildly lower BP at 104/55, however patient is on midodrine. Physical exam as noted above.   I independently reviewed and interpreted the patient's labs.  CK, phosphorus, and magnesium within normal limits.  CMP shows a creatinine of 5.52, however patient is known on dialysis.  Elevated alk phos at 132.  From previous charts, this appears to be chronically elevated.  Patient does have elevated calcium at 10.6.  It appears to have been lower in her multiple recent blood draws.  Albumin within normal limits.  I-STAT Chem-8 shows ionized calcium within normal limits.  CBC shows mild anemia and hemoglobin 11 however appears at her baseline.  Platelets at 116, slightly lower than her baseline.  No leukocytosis.  Chest x-ray shows no acute cardiopulmonary process.  I discussed the patient, presentation, and work up results with my attending who reviewed the chart. He is not concerned for the elevated calcium and does not recommend any additional work up or treatment. Unsure of what may be causing her bilateral calf pain with ambulation. She has easily palpable DP and PT pulses that are symmetric.  I doubt any claudication because of this. I think it is less likely bilateral DVTs given no swelling. She denies any history of this as well. May be some general deconditioning that is causing these  symptoms. For  her hand and shoulder pain, this sounds like arthritis. There is no increase in warmth or overlying erythema, doubt any septic arthritis. She does not have any fever. I'm questioning if this has some seasonal allergy component to her nasal congestion and rhinorrhea. She is afebrile and denies having a fever with these symptoms. I recommended that she trial Claritin to see if her symptoms improve.  CK within normal limits, doubt any rhabdomyolysis.  Could have some rheumatologic involvement however do not see any emergent cause.  Overall, these symptoms are not acute in presentation. My attending reports that she can be discharged home with PCP follow up.   The patient currently does not have a PCP but is scheduled to see one in May. I have included the information for the [facility] and [facility] for follow up as well if they can see her sooner to establish care.   We discussed the results of the labs/imaging. The plan is follow up with PCP. We discussed strict return precautions and red flag symptoms. The patient verbalized their understanding and agrees to the plan. The patient is stable and being discharged home in good condition.  I discussed this case with my attending physician who cosigned this note including patient's presenting symptoms, physical exam, and planned diagnostics and interventions. Attending physician stated agreement with plan or made changes to plan which were implemented.   Portions of this report may have been transcribed using voice recognition software. Every effort was made to ensure accuracy; however, inadvertent computerized transcription errors may be present.    Final Clinical Impression(s) / ED Diagnoses Final diagnoses:  Bilateral hand pain  Generalized body aches    Rx / DC Orders ED Discharge Orders     None         Achille Rich, PA-C 05/25/23 1710    Vanetta Mulders, MD 05/27/23 1539

## 2023-05-23 NOTE — Discharge Instructions (Addendum)
 You were seen in the ER today for evaluation of your symptoms. Your calcium is slightly elevated at 10.6. Please let dialysis know this at your next session. For pain, you can take Tylenol 1000mg  every 6 hours as needed for pain. I think this is likely some arthritis pain you are feeling in your hands. I would like for you to follow up with a primary care provider. I have included on for you in the discharge paperwork. Please make sure to call to follow up . If you start having any swelling, temperature changes, color changes, please return to the nearest emergency department for evaluation.  If you have any concerns, new or any worsening symptoms, please return the nearest emerged department for reevaluation.  Contact a doctor if: The pain gets worse. You have a fever. Get help right away if: You have very bad pain in your joint. You have swelling in your joint. Your joint is red. Many joints become painful and swollen. You have very bad back pain. Your leg is very weak.

## 2023-05-24 DIAGNOSIS — Z992 Dependence on renal dialysis: Secondary | ICD-10-CM | POA: Diagnosis not present

## 2023-05-24 DIAGNOSIS — N2581 Secondary hyperparathyroidism of renal origin: Secondary | ICD-10-CM | POA: Diagnosis not present

## 2023-05-24 DIAGNOSIS — N186 End stage renal disease: Secondary | ICD-10-CM | POA: Diagnosis not present

## 2023-05-24 DIAGNOSIS — D631 Anemia in chronic kidney disease: Secondary | ICD-10-CM | POA: Diagnosis not present

## 2023-05-27 DIAGNOSIS — D631 Anemia in chronic kidney disease: Secondary | ICD-10-CM | POA: Diagnosis not present

## 2023-05-27 DIAGNOSIS — N2581 Secondary hyperparathyroidism of renal origin: Secondary | ICD-10-CM | POA: Diagnosis not present

## 2023-05-27 DIAGNOSIS — Z992 Dependence on renal dialysis: Secondary | ICD-10-CM | POA: Diagnosis not present

## 2023-05-27 DIAGNOSIS — N186 End stage renal disease: Secondary | ICD-10-CM | POA: Diagnosis not present

## 2023-05-28 DIAGNOSIS — N186 End stage renal disease: Secondary | ICD-10-CM | POA: Diagnosis not present

## 2023-05-28 DIAGNOSIS — Z992 Dependence on renal dialysis: Secondary | ICD-10-CM | POA: Diagnosis not present

## 2023-05-28 DIAGNOSIS — N039 Chronic nephritic syndrome with unspecified morphologic changes: Secondary | ICD-10-CM | POA: Diagnosis not present

## 2023-05-29 DIAGNOSIS — N2581 Secondary hyperparathyroidism of renal origin: Secondary | ICD-10-CM | POA: Diagnosis not present

## 2023-05-29 DIAGNOSIS — N186 End stage renal disease: Secondary | ICD-10-CM | POA: Diagnosis not present

## 2023-05-29 DIAGNOSIS — Z992 Dependence on renal dialysis: Secondary | ICD-10-CM | POA: Diagnosis not present

## 2023-05-29 DIAGNOSIS — D509 Iron deficiency anemia, unspecified: Secondary | ICD-10-CM | POA: Diagnosis not present

## 2023-05-29 DIAGNOSIS — D631 Anemia in chronic kidney disease: Secondary | ICD-10-CM | POA: Diagnosis not present

## 2023-05-30 ENCOUNTER — Encounter: Payer: Self-pay | Admitting: Family Medicine

## 2023-05-30 ENCOUNTER — Ambulatory Visit: Payer: Self-pay | Admitting: Family Medicine

## 2023-05-30 VITALS — BP 120/72 | HR 64 | Temp 98.6°F | Ht 59.0 in | Wt 143.0 lb

## 2023-05-30 DIAGNOSIS — J301 Allergic rhinitis due to pollen: Secondary | ICD-10-CM

## 2023-05-30 DIAGNOSIS — Z992 Dependence on renal dialysis: Secondary | ICD-10-CM

## 2023-05-30 DIAGNOSIS — I739 Peripheral vascular disease, unspecified: Secondary | ICD-10-CM | POA: Diagnosis not present

## 2023-05-30 DIAGNOSIS — L84 Corns and callosities: Secondary | ICD-10-CM | POA: Diagnosis not present

## 2023-05-30 DIAGNOSIS — G5603 Carpal tunnel syndrome, bilateral upper limbs: Secondary | ICD-10-CM

## 2023-05-30 DIAGNOSIS — L603 Nail dystrophy: Secondary | ICD-10-CM | POA: Diagnosis not present

## 2023-05-30 DIAGNOSIS — N186 End stage renal disease: Secondary | ICD-10-CM | POA: Diagnosis not present

## 2023-05-30 DIAGNOSIS — Z952 Presence of prosthetic heart valve: Secondary | ICD-10-CM

## 2023-05-30 DIAGNOSIS — H903 Sensorineural hearing loss, bilateral: Secondary | ICD-10-CM

## 2023-05-30 DIAGNOSIS — N2581 Secondary hyperparathyroidism of renal origin: Secondary | ICD-10-CM | POA: Diagnosis not present

## 2023-05-30 DIAGNOSIS — Z7689 Persons encountering health services in other specified circumstances: Secondary | ICD-10-CM

## 2023-05-30 MED ORDER — TRAMADOL HCL 50 MG PO TABS: 50.0000 mg | ORAL_TABLET | Freq: Every day | ORAL | 0 refills | Status: AC

## 2023-05-30 MED ORDER — LORATADINE 5 MG PO TBDP: 1.0000 | ORAL_TABLET | Freq: Every day | ORAL | 0 refills | Status: DC

## 2023-05-30 NOTE — Progress Notes (Signed)
 I,[name] Marks, CMA,acting as a Neurosurgeon for [employer], NP.,have documented all relevant documentation on the behalf of [name] Hose, NP,as directed by  [name] Hose, NP while in the presence of [name] Hose, NP.  Subjective:  Patient ID: [name] Marks , female    DOB: 11-01-1955 , 68 y.o.   MRN: [id number]  Chief Complaint  Patient presents with   Establish Care    HPI  Patient is a 68 year old female who presents today to establish care. She is in ESRD and has been dependent on renal dialysis for the past 25 years,  states she was first diagnosed at 30 with kidney disease and got on dialysis at the age of 25. She states that her mum,sister,her nephew, her son all have kidney disease and are on dialysis.Patient goes for dialysis  M, W, F at [facility] dialysis center. Patient  has a diagnosis of hearing loss in both ears, states she had hearing aids but lost them over a year ago. She will go back to ENT to get new ones she states because she can't hear anything except you yell and I discussed my safety concerns with her.      Past Medical History:  Diagnosis Date   Anemia    Aortic valve prosthesis present 02/25/2016   Arthritis    "knees" (01/03/2017)   AVD (aortic valve disease) 07/12/2016   Backache 12/06/2008   Bacteremia due to coagulase-negative Staphylococcus    Carpal tunnel syndrome    Cerebral embolism with transient ischemic attack (TIA)    Cholelithiases 01/28/2017   Chronic female pelvic pain 08/08/2012   Complication of anesthesia    01/01/17- '"a long time ago, difficulty breathimg, not sure if it was due to anesthesia or not."   CVA (cerebral vascular accident) (HCC) 05/16/2016   Dialysis patient [facility])    End stage renal disease on dialysis (HCC)    "MWF; [address] (01/03/2017)   Endocarditis    Esophageal reflux 12/06/2008   GERD (gastroesophageal reflux disease)    Gout    History of blood transfusion 2017   "related to blood poison" (01/03/2017)    Increased endometrial stripe thickness 11/03/2015   Neck pain    Osteoporosis 09/2016   T score -2.6   Pain and swelling of right upper extremity 12/20/2015   Pulmonary HTN (HCC) 07/12/2016   Renal dialysis device, implant, or graft complication 12/20/2015   Renal insufficiency    S/P cholecystectomy 02/14/2017   Septic shock (HCC)    Staphylococcus aureus bacteremia    TIA (transient ischemic attack)    "several" (01/03/2017)     Family History  Problem Relation Age of Onset   Diabetes Mother    Hypertension Mother    Heart disease Mother    Alcohol abuse Mother    Kidney disease Mother    Cancer Father        COLON   Alcohol abuse Father    Breast cancer Sister 41   Hypertension Sister    Kidney disease Sister    Breast cancer Sister        breast cancer   Hypertension Son    Kidney disease Son      Current Outpatient Medications:    cinacalcet (SENSIPAR) 90 MG tablet, Take 90 mg by mouth at bedtime., Disp: , Rfl:    clopidogrel (PLAVIX) 75 MG tablet, Take 1 tablet (75 mg total) by mouth daily., Disp: 90 tablet, Rfl: 3   famotidine (PEPCID) 20 MG  tablet, Take 20 mg by mouth daily after supper., Disp: , Rfl:    fluticasone (FLONASE) 50 MCG/ACT nasal spray, Place 2 sprays into both nostrils daily., Disp: 9.9 mL, Rfl: 2   Loratadine 5 MG TBDP, Take 1 tablet (5 mg total) by mouth daily., Disp: 90 tablet, Rfl: 0   midodrine (PROAMATINE) 10 MG tablet, Take 10 mg by mouth See admin instructions. Take 1 tablet (10 mg) by mouth before dialysis & take 1 tablet (10 mg) while at dialysis on Mondays, Wednesdays & Fridays., Disp: , Rfl:    ondansetron (ZOFRAN-ODT) 4 MG disintegrating tablet, Take 1 tablet (4 mg total) by mouth every 8 (eight) hours as needed for nausea or vomiting., Disp: 20 tablet, Rfl: 0   sevelamer carbonate (RENVELA) 800 MG tablet, Take 2,400 mg by mouth See admin instructions. Take 3 tablets (2400 mg) by mouth after each meal and after each snack, Disp: , Rfl:     traMADol (ULTRAM) 50 MG tablet, Take 1 tablet (50 mg total) by mouth at bedtime for 20 days., Disp: 20 tablet, Rfl: 0   predniSONE (DELTASONE) 20 MG tablet, Take 2 tablets (40 mg total) by mouth daily., Disp: 10 tablet, Rfl: 0   Allergies  Allergen Reactions   Iodinated Contrast Media Anaphylaxis and Hives   Cefazolin Nausea And Vomiting   Diphenhydramine Other (See Comments)   Naproxen Sodium Itching     Review of Systems  Constitutional: Negative.   HENT:  Positive for hearing loss.   Respiratory: Negative.    Cardiovascular: Negative.   Gastrointestinal: Negative.   Musculoskeletal:  Positive for arthralgias.       Bilateral hands     Today's Vitals   05/30/23 1451  BP: 120/72  Pulse: 64  Temp: 98.6 F (37 C)  TempSrc: Oral  Weight: 143 lb (64.9 kg)  Height: 4\' 11"  (1.499 m)  PainSc: 6   PainLoc: Hand   Body mass index is 28.88 kg/m.  Wt Readings from Last 3 Encounters:  06/04/23 141 lb 1.5 oz (64 kg)  05/30/23 143 lb (64.9 kg)  05/23/23 141 lb (64 kg)    The ASCVD Risk score (Arnett DK, et al., 2019) failed to calculate for the following reasons:   Risk score cannot be calculated because patient has a medical history suggesting prior/existing ASCVD  Objective:  Physical Exam HENT:     Head: Normocephalic and atraumatic.  Cardiovascular:     Rate and Rhythm: Bradycardia present.     Comments: 56 on auscultation Skin:    General: Skin is dry.  Neurological:     General: No focal deficit present.     Mental Status: She is alert and oriented to person, place, and time.         Assessment And Plan:  Encounter to establish care with new doctor  Seasonal allergic rhinitis due to pollen -     Loratadine; Take 1 tablet (5 mg total) by mouth daily.  Dispense: 90 tablet; Refill: 0  ESRD (end stage renal disease) on dialysis [facility]) Assessment & Plan: Dialysis M, W, F   S/P aortic valve replacement Assessment & Plan: Has a prosthetic  valve   Sensorineural hearing loss (SNHL) of both ears Assessment & Plan: States that she lost her hearing aids in July 2023; advised to go back to ENT and she agreed.   Hyperparathyroidism due to renal insufficiency (HCC)  Bilateral carpal tunnel syndrome Assessment & Plan: Had surgery on his left wrist 5 years ago.  Orders: -     traMADol HCl; Take 1 tablet (50 mg total) by mouth at bedtime for 20 days.  Dispense: 20 tablet; Refill: 0    Return in 9 weeks (on 08/01/2023), or if symptoms worsen or fail to improve, for physical.  Patient was given opportunity to ask questions. Patient verbalized understanding of the plan and was able to repeat key elements of the plan. All questions were answered to their satisfaction.    I, [name] Hose, NP, have reviewed all documentation for this visit. The documentation on 05/30/2023 for the exam, diagnosis, procedures, and orders are all accurate and complete.     IF YOU HAVE BEEN REFERRED TO A SPECIALIST, IT MAY TAKE 1-2 WEEKS TO SCHEDULE/PROCESS THE REFERRAL. IF YOU HAVE NOT HEARD FROM US/SPECIALIST IN TWO WEEKS, PLEASE GIVE [place] A CALL AT [phone] X [phone].

## 2023-05-30 NOTE — Assessment & Plan Note (Signed)
 Has a prosthetic valve

## 2023-05-30 NOTE — Assessment & Plan Note (Signed)
 Had surgery on his left wrist 5 years ago.

## 2023-05-30 NOTE — Assessment & Plan Note (Signed)
 States that she lost her hearing aids in July 2023; advised to go back to ENT and she agreed.

## 2023-05-31 DIAGNOSIS — N186 End stage renal disease: Secondary | ICD-10-CM | POA: Diagnosis not present

## 2023-05-31 DIAGNOSIS — D509 Iron deficiency anemia, unspecified: Secondary | ICD-10-CM | POA: Diagnosis not present

## 2023-05-31 DIAGNOSIS — Z992 Dependence on renal dialysis: Secondary | ICD-10-CM | POA: Diagnosis not present

## 2023-05-31 DIAGNOSIS — D631 Anemia in chronic kidney disease: Secondary | ICD-10-CM | POA: Diagnosis not present

## 2023-05-31 DIAGNOSIS — N2581 Secondary hyperparathyroidism of renal origin: Secondary | ICD-10-CM | POA: Diagnosis not present

## 2023-06-03 DIAGNOSIS — D631 Anemia in chronic kidney disease: Secondary | ICD-10-CM | POA: Diagnosis not present

## 2023-06-03 DIAGNOSIS — D509 Iron deficiency anemia, unspecified: Secondary | ICD-10-CM | POA: Diagnosis not present

## 2023-06-03 DIAGNOSIS — N186 End stage renal disease: Secondary | ICD-10-CM | POA: Diagnosis not present

## 2023-06-03 DIAGNOSIS — Z992 Dependence on renal dialysis: Secondary | ICD-10-CM | POA: Diagnosis not present

## 2023-06-03 DIAGNOSIS — N2581 Secondary hyperparathyroidism of renal origin: Secondary | ICD-10-CM | POA: Diagnosis not present

## 2023-06-04 ENCOUNTER — Emergency Department (HOSPITAL_COMMUNITY)
Admission: EM | Admit: 2023-06-04 | Discharge: 2023-06-04 | Disposition: A | Discharge status: Home / Self Care | Attending: Emergency Medicine | Admitting: Emergency Medicine

## 2023-06-04 ENCOUNTER — Emergency Department (HOSPITAL_COMMUNITY)

## 2023-06-04 ENCOUNTER — Encounter (HOSPITAL_COMMUNITY): Payer: Self-pay | Admitting: Emergency Medicine

## 2023-06-04 ENCOUNTER — Other Ambulatory Visit: Payer: Self-pay

## 2023-06-04 DIAGNOSIS — Z7902 Long term (current) use of antithrombotics/antiplatelets: Secondary | ICD-10-CM | POA: Insufficient documentation

## 2023-06-04 DIAGNOSIS — M85861 Other specified disorders of bone density and structure, right lower leg: Secondary | ICD-10-CM | POA: Diagnosis not present

## 2023-06-04 DIAGNOSIS — N186 End stage renal disease: Secondary | ICD-10-CM | POA: Insufficient documentation

## 2023-06-04 DIAGNOSIS — Z8673 Personal history of transient ischemic attack (TIA), and cerebral infarction without residual deficits: Secondary | ICD-10-CM | POA: Insufficient documentation

## 2023-06-04 DIAGNOSIS — M25561 Pain in right knee: Secondary | ICD-10-CM | POA: Diagnosis not present

## 2023-06-04 HISTORY — DX: Dependence on renal dialysis: Z99.2

## 2023-06-04 MED ORDER — TRAMADOL HCL 50 MG PO TABS
50.0000 mg | ORAL_TABLET | Freq: Once | ORAL | Status: AC
  Administered 2023-06-04: 50 mg via ORAL
  Filled 2023-06-04: qty 1

## 2023-06-04 MED ORDER — PREDNISONE 20 MG PO TABS: 40.0000 mg | ORAL_TABLET | Freq: Every day | ORAL | 0 refills | Status: DC

## 2023-06-04 MED ORDER — ACETAMINOPHEN 500 MG PO TABS
1000.0000 mg | ORAL_TABLET | Freq: Once | ORAL | Status: AC
  Administered 2023-06-04: 1000 mg via ORAL
  Filled 2023-06-04: qty 2

## 2023-06-04 NOTE — ED Triage Notes (Signed)
 Patient comes in for right knee since yesterday. No known injury  No medication for pain today.  Pain worse with movement.  MWF dialysis

## 2023-06-04 NOTE — ED Provider Notes (Signed)
 [place] EMERGENCY DEPARTMENT AT [facility] Provider Note   CSN: [id number] Arrival date & time: 06/04/23  1325     History  No chief complaint on file.   [name] Marks is a 68 y.o. female. With past medical history of osteoporosis, gout, ESRD, history of endocarditis, CVA, pulmonary HTN, s/p aortic valve replacement presenting to ER with complaint of right knee pain that started yesterday. Patient is on dialysis MWF, went yesterday, no issues. She reports she has been told that he has arthritis in the past.  She reports she was getting up to walk to the kitchen yesterday when she stepped and tweaked her knee.  She reports it is a sharp and shooting sensation that is constant and comes and goes.  She reports she is overhead movement this past and has not noticed any associated swelling or redness or fevers.  She has not had any injury trauma or fall.  She is able to ambulate with cane does need cane to ambulate at baseline.  Does not have any swelling in the calf or calf pain. Has not tried anything for pain prior to coming in. She denies any chest pain, shortness of breath, cough, abdominal pain nausea vomiting or diarrhea.  HPI     Home Medications Prior to Admission medications   Medication Sig Start Date End Date Taking? Authorizing Provider  cinacalcet (SENSIPAR) 90 MG tablet Take 90 mg by mouth at bedtime.    [provider]  clopidogrel (PLAVIX) 75 MG tablet Take 1 tablet (75 mg total) by mouth daily. 05/15/22   Sharlene Dory, PA-C  famotidine (PEPCID) 20 MG tablet Take 20 mg by mouth daily after supper.    [provider]  fluticasone (FLONASE) 50 MCG/ACT nasal spray Place 2 sprays into both nostrils daily. 04/20/23   Doristine Mango A, PA-C  Loratadine 5 MG TBDP Take 1 tablet (5 mg total) by mouth daily. 05/30/23   [name], NP  midodrine (PROAMATINE) 10 MG tablet Take 10 mg by mouth See admin instructions. Take 1 tablet (10 mg) by mouth before  dialysis & take 1 tablet (10 mg) while at dialysis on Mondays, Wednesdays & Fridays. 08/15/20   [provider]  ondansetron (ZOFRAN-ODT) 4 MG disintegrating tablet Take 1 tablet (4 mg total) by mouth every 8 (eight) hours as needed for nausea or vomiting. 10/31/22   Valinda Hoar, NP  sevelamer carbonate (RENVELA) 800 MG tablet Take 2,400 mg by mouth See admin instructions. Take 3 tablets (2400 mg) by mouth after each meal and after each snack    [provider]  traMADol (ULTRAM) 50 MG tablet Take 1 tablet (50 mg total) by mouth at bedtime for 20 days. 05/30/23 06/19/23  [name], NP      Allergies    Iodinated contrast media, Cefazolin, Diphenhydramine, and Naproxen sodium    Review of Systems   Review of Systems  Musculoskeletal:  Positive for arthralgias.    Physical Exam Updated Vital Signs BP (!) 102/56   Pulse 76   Temp 98 F (36.7 C) (Oral)   Resp 18   SpO2 100%  Physical Exam Vitals and nursing note reviewed.  Constitutional:      General: She is not in acute distress.    Appearance: She is not toxic-appearing.  HENT:     Head: Normocephalic and atraumatic.  Eyes:     General: No scleral icterus.    Conjunctiva/sclera: Conjunctivae normal.  Cardiovascular:  Rate and Rhythm: Normal rate and regular rhythm.     Pulses: Normal pulses.     Heart sounds: Normal heart sounds.  Pulmonary:     Effort: Pulmonary effort is normal. No respiratory distress.     Breath sounds: Normal breath sounds.  Abdominal:     General: Abdomen is flat. Bowel sounds are normal.     Palpations: Abdomen is soft.     Tenderness: There is no abdominal tenderness.  Musculoskeletal:     Comments: Patient is neurovascularly intact.  She has some discomfort with resisted knee flexion however she has normal active range of motion of her knee.  She is strong dorsal pedal pulse equal bilaterally.  She has no unilateral calf swelling or erythema or pain.  She is tender to  palpation over the right joint line without any obvious joint effusion.  Skin:    General: Skin is warm and dry.     Findings: No lesion.  Neurological:     General: No focal deficit present.     Mental Status: She is alert and oriented to person, place, and time. Mental status is at baseline.     ED Results / Procedures / Treatments   Labs (all labs ordered are listed, but only abnormal results are displayed) Labs Reviewed - No data to display  EKG None  Radiology DG Knee Complete 4 Views Right Result Date: 06/04/2023 CLINICAL DATA:  Pain right knee EXAM: RIGHT KNEE - COMPLETE 4 VIEW COMPARISON:  None Available. FINDINGS: Chondrocalcinosis identified. Small osteophytes seen of all 3 compartments. Subchondral cyst formation along the medial compartment along the tibial plateau. No fracture or dislocation. Osteopenia. No joint effusion lateral view. Vascular calcifications. IMPRESSION: Tracking metal degenerative changes.  Chondrocalcinosis. Electronically Signed   By: Karen Kays M.D.   On: 06/04/2023 14:50    Procedures Procedures    Medications Ordered in ED Medications - No data to display  ED Course/ Medical Decision Making/ A&P                                 Medical Decision Making Amount and/or Complexity of Data Reviewed Radiology: ordered.  Risk OTC drugs. Prescription drug management.   This patient presents to the ED for concern of knee pain, this involves an extensive number of treatment options, and is a complaint that carries with it a high risk of complications and morbidity.  The differential diagnosis includes septic joint, osteoarthritis, fracture, patellar tendon rupture, gout, pseudogout   Co morbidities that complicate the patient evaluation  Osteoporosis, gout, ESRD, history of endocarditis, CVA, pulmonary HTN, s/p aortic valve replacement    Imaging Studies ordered:  I ordered imaging studies including right knee x-ray  I independently  visualized and interpreted imaging which showed chondrocalcinosis and osteophytes. Degenerative changes.  I agree with the radiologist interpretation   Cardiac Monitoring: / EKG:  The patient was maintained on a cardiac monitor.   Problem List / ED Course / Critical interventions / Medication management  Patient reporting to emergency room with right knee pain. Denies injury, trauma or fall.  Patient reports her knee pain is mild to moderate.  She has normal active range of motion and she is neurovascularly intact. Strong dorsal pedal pulse, no discoloration or cold extremity, normal sensation. She has no obvious joint edema.  She has no sign of overlying cellulitis and knee is not warm to the touch.  She does  have history of gout but reports this does not feel like that.  I do not suspect that she has septic joint given presentation.  She denies any fever or chills.  Will obtain x-ray to rule out any acute fractures or evaluate for arthritis.  Will give tramadol and Tylenol after patient's home medication for pain control which she has not taken today. Will reassess.  X-ray with chondrocalcinosis which is consistent with calcium deposits and gout.  Patient does have history of gout. Will trial steroids.  Patient was given a knee brace.  She is able to ambulate with steady gait.  Discussed pain management options, follow-up and return precautions. I ordered medication including Tramadol and Tylenol for pain control  Reevaluation of the patient after these medicines showed that the patient improved I have reviewed the patients home medicines and have made adjustments as needed   Plan  F/u w/ PCP in 2-3d to ensure resolution of sx.  Patient was given return precautions. Patient stable for discharge at this time.  Patient educated on sx/dx and verbalized understanding of plan. Return to ER w/ new or worsening sx.          Final Clinical Impression(s) / ED Diagnoses Final diagnoses:   Acute pain of right knee    Rx / DC Orders ED Discharge Orders     None         Reinaldo Raddle 06/04/23 1644    Lorre Nick, MD 06/06/23 1558

## 2023-06-04 NOTE — Discharge Instructions (Signed)
 Your x-ray shows arthritis. Wear knee brace when you are walking to help support joint.  Take 1000mg  of Tylenol every 6 hours. Tramadol for break through pain. Ice and heat.  I have sent prednisone to your pharmacy to help with inflammation and swelling. Take for 5 days.  Follow up with orthopedics or primary care.

## 2023-06-04 NOTE — Progress Notes (Signed)
 Orthopedic Tech Progress Note Patient Details:  [name] Marks 1955-10-18 [id number]  Ortho Devices Type of Ortho Device: Knee Sleeve Ortho Device/Splint Location: right hinged knee brace applied Ortho Device/Splint Interventions: Ordered, Application, Adjustment   Post Interventions Patient Tolerated: Well Instructions Provided: Adjustment of device, Care of device  Kizzie Fantasia 06/04/2023, 4:02 PM

## 2023-06-05 DIAGNOSIS — D631 Anemia in chronic kidney disease: Secondary | ICD-10-CM | POA: Diagnosis not present

## 2023-06-05 DIAGNOSIS — D509 Iron deficiency anemia, unspecified: Secondary | ICD-10-CM | POA: Diagnosis not present

## 2023-06-05 DIAGNOSIS — N2581 Secondary hyperparathyroidism of renal origin: Secondary | ICD-10-CM | POA: Diagnosis not present

## 2023-06-05 DIAGNOSIS — N186 End stage renal disease: Secondary | ICD-10-CM | POA: Diagnosis not present

## 2023-06-05 DIAGNOSIS — Z992 Dependence on renal dialysis: Secondary | ICD-10-CM | POA: Diagnosis not present

## 2023-06-05 NOTE — Assessment & Plan Note (Signed)
 Dialysis M,W,F.

## 2023-06-07 DIAGNOSIS — N186 End stage renal disease: Secondary | ICD-10-CM | POA: Diagnosis not present

## 2023-06-07 DIAGNOSIS — Z992 Dependence on renal dialysis: Secondary | ICD-10-CM | POA: Diagnosis not present

## 2023-06-07 DIAGNOSIS — D631 Anemia in chronic kidney disease: Secondary | ICD-10-CM | POA: Diagnosis not present

## 2023-06-07 DIAGNOSIS — N2581 Secondary hyperparathyroidism of renal origin: Secondary | ICD-10-CM | POA: Diagnosis not present

## 2023-06-07 DIAGNOSIS — D509 Iron deficiency anemia, unspecified: Secondary | ICD-10-CM | POA: Diagnosis not present

## 2023-06-10 DIAGNOSIS — D509 Iron deficiency anemia, unspecified: Secondary | ICD-10-CM | POA: Diagnosis not present

## 2023-06-10 DIAGNOSIS — D631 Anemia in chronic kidney disease: Secondary | ICD-10-CM | POA: Diagnosis not present

## 2023-06-10 DIAGNOSIS — N2581 Secondary hyperparathyroidism of renal origin: Secondary | ICD-10-CM | POA: Diagnosis not present

## 2023-06-10 DIAGNOSIS — Z992 Dependence on renal dialysis: Secondary | ICD-10-CM | POA: Diagnosis not present

## 2023-06-10 DIAGNOSIS — N186 End stage renal disease: Secondary | ICD-10-CM | POA: Diagnosis not present

## 2023-06-12 DIAGNOSIS — D509 Iron deficiency anemia, unspecified: Secondary | ICD-10-CM | POA: Diagnosis not present

## 2023-06-12 DIAGNOSIS — D631 Anemia in chronic kidney disease: Secondary | ICD-10-CM | POA: Diagnosis not present

## 2023-06-12 DIAGNOSIS — Z992 Dependence on renal dialysis: Secondary | ICD-10-CM | POA: Diagnosis not present

## 2023-06-12 DIAGNOSIS — N2581 Secondary hyperparathyroidism of renal origin: Secondary | ICD-10-CM | POA: Diagnosis not present

## 2023-06-12 DIAGNOSIS — N186 End stage renal disease: Secondary | ICD-10-CM | POA: Diagnosis not present

## 2023-06-14 DIAGNOSIS — Z992 Dependence on renal dialysis: Secondary | ICD-10-CM | POA: Diagnosis not present

## 2023-06-14 DIAGNOSIS — D509 Iron deficiency anemia, unspecified: Secondary | ICD-10-CM | POA: Diagnosis not present

## 2023-06-14 DIAGNOSIS — N186 End stage renal disease: Secondary | ICD-10-CM | POA: Diagnosis not present

## 2023-06-14 DIAGNOSIS — N2581 Secondary hyperparathyroidism of renal origin: Secondary | ICD-10-CM | POA: Diagnosis not present

## 2023-06-14 DIAGNOSIS — D631 Anemia in chronic kidney disease: Secondary | ICD-10-CM | POA: Diagnosis not present

## 2023-06-17 DIAGNOSIS — N2581 Secondary hyperparathyroidism of renal origin: Secondary | ICD-10-CM | POA: Diagnosis not present

## 2023-06-17 DIAGNOSIS — Z992 Dependence on renal dialysis: Secondary | ICD-10-CM | POA: Diagnosis not present

## 2023-06-17 DIAGNOSIS — D631 Anemia in chronic kidney disease: Secondary | ICD-10-CM | POA: Diagnosis not present

## 2023-06-17 DIAGNOSIS — D509 Iron deficiency anemia, unspecified: Secondary | ICD-10-CM | POA: Diagnosis not present

## 2023-06-17 DIAGNOSIS — N186 End stage renal disease: Secondary | ICD-10-CM | POA: Diagnosis not present

## 2023-06-19 DIAGNOSIS — N2581 Secondary hyperparathyroidism of renal origin: Secondary | ICD-10-CM | POA: Diagnosis not present

## 2023-06-19 DIAGNOSIS — D631 Anemia in chronic kidney disease: Secondary | ICD-10-CM | POA: Diagnosis not present

## 2023-06-19 DIAGNOSIS — N186 End stage renal disease: Secondary | ICD-10-CM | POA: Diagnosis not present

## 2023-06-19 DIAGNOSIS — D509 Iron deficiency anemia, unspecified: Secondary | ICD-10-CM | POA: Diagnosis not present

## 2023-06-19 DIAGNOSIS — Z992 Dependence on renal dialysis: Secondary | ICD-10-CM | POA: Diagnosis not present

## 2023-06-21 DIAGNOSIS — D509 Iron deficiency anemia, unspecified: Secondary | ICD-10-CM | POA: Diagnosis not present

## 2023-06-21 DIAGNOSIS — D631 Anemia in chronic kidney disease: Secondary | ICD-10-CM | POA: Diagnosis not present

## 2023-06-21 DIAGNOSIS — N186 End stage renal disease: Secondary | ICD-10-CM | POA: Diagnosis not present

## 2023-06-21 DIAGNOSIS — N2581 Secondary hyperparathyroidism of renal origin: Secondary | ICD-10-CM | POA: Diagnosis not present

## 2023-06-21 DIAGNOSIS — Z992 Dependence on renal dialysis: Secondary | ICD-10-CM | POA: Diagnosis not present

## 2023-06-24 DIAGNOSIS — D631 Anemia in chronic kidney disease: Secondary | ICD-10-CM | POA: Diagnosis not present

## 2023-06-24 DIAGNOSIS — N186 End stage renal disease: Secondary | ICD-10-CM | POA: Diagnosis not present

## 2023-06-24 DIAGNOSIS — D509 Iron deficiency anemia, unspecified: Secondary | ICD-10-CM | POA: Diagnosis not present

## 2023-06-24 DIAGNOSIS — N2581 Secondary hyperparathyroidism of renal origin: Secondary | ICD-10-CM | POA: Diagnosis not present

## 2023-06-24 DIAGNOSIS — Z992 Dependence on renal dialysis: Secondary | ICD-10-CM | POA: Diagnosis not present

## 2023-06-26 DIAGNOSIS — D631 Anemia in chronic kidney disease: Secondary | ICD-10-CM | POA: Diagnosis not present

## 2023-06-26 DIAGNOSIS — N186 End stage renal disease: Secondary | ICD-10-CM | POA: Diagnosis not present

## 2023-06-26 DIAGNOSIS — N2581 Secondary hyperparathyroidism of renal origin: Secondary | ICD-10-CM | POA: Diagnosis not present

## 2023-06-26 DIAGNOSIS — D509 Iron deficiency anemia, unspecified: Secondary | ICD-10-CM | POA: Diagnosis not present

## 2023-06-26 DIAGNOSIS — Z992 Dependence on renal dialysis: Secondary | ICD-10-CM | POA: Diagnosis not present

## 2023-06-27 DIAGNOSIS — N039 Chronic nephritic syndrome with unspecified morphologic changes: Secondary | ICD-10-CM | POA: Diagnosis not present

## 2023-06-27 DIAGNOSIS — N186 End stage renal disease: Secondary | ICD-10-CM | POA: Diagnosis not present

## 2023-06-27 DIAGNOSIS — Z992 Dependence on renal dialysis: Secondary | ICD-10-CM | POA: Diagnosis not present

## 2023-07-01 DIAGNOSIS — Z992 Dependence on renal dialysis: Secondary | ICD-10-CM | POA: Diagnosis not present

## 2023-07-01 DIAGNOSIS — N2581 Secondary hyperparathyroidism of renal origin: Secondary | ICD-10-CM | POA: Diagnosis not present

## 2023-07-01 DIAGNOSIS — D631 Anemia in chronic kidney disease: Secondary | ICD-10-CM | POA: Diagnosis not present

## 2023-07-01 DIAGNOSIS — N186 End stage renal disease: Secondary | ICD-10-CM | POA: Diagnosis not present

## 2023-07-01 DIAGNOSIS — D509 Iron deficiency anemia, unspecified: Secondary | ICD-10-CM | POA: Diagnosis not present

## 2023-07-03 DIAGNOSIS — D631 Anemia in chronic kidney disease: Secondary | ICD-10-CM | POA: Diagnosis not present

## 2023-07-03 DIAGNOSIS — N186 End stage renal disease: Secondary | ICD-10-CM | POA: Diagnosis not present

## 2023-07-03 DIAGNOSIS — Z992 Dependence on renal dialysis: Secondary | ICD-10-CM | POA: Diagnosis not present

## 2023-07-03 DIAGNOSIS — D509 Iron deficiency anemia, unspecified: Secondary | ICD-10-CM | POA: Diagnosis not present

## 2023-07-03 DIAGNOSIS — N2581 Secondary hyperparathyroidism of renal origin: Secondary | ICD-10-CM | POA: Diagnosis not present

## 2023-07-05 DIAGNOSIS — D631 Anemia in chronic kidney disease: Secondary | ICD-10-CM | POA: Diagnosis not present

## 2023-07-05 DIAGNOSIS — N2581 Secondary hyperparathyroidism of renal origin: Secondary | ICD-10-CM | POA: Diagnosis not present

## 2023-07-05 DIAGNOSIS — Z992 Dependence on renal dialysis: Secondary | ICD-10-CM | POA: Diagnosis not present

## 2023-07-05 DIAGNOSIS — N186 End stage renal disease: Secondary | ICD-10-CM | POA: Diagnosis not present

## 2023-07-05 DIAGNOSIS — D509 Iron deficiency anemia, unspecified: Secondary | ICD-10-CM | POA: Diagnosis not present

## 2023-07-08 DIAGNOSIS — Z992 Dependence on renal dialysis: Secondary | ICD-10-CM | POA: Diagnosis not present

## 2023-07-08 DIAGNOSIS — N2581 Secondary hyperparathyroidism of renal origin: Secondary | ICD-10-CM | POA: Diagnosis not present

## 2023-07-08 DIAGNOSIS — D509 Iron deficiency anemia, unspecified: Secondary | ICD-10-CM | POA: Diagnosis not present

## 2023-07-08 DIAGNOSIS — D631 Anemia in chronic kidney disease: Secondary | ICD-10-CM | POA: Diagnosis not present

## 2023-07-08 DIAGNOSIS — N186 End stage renal disease: Secondary | ICD-10-CM | POA: Diagnosis not present

## 2023-07-09 ENCOUNTER — Other Ambulatory Visit: Payer: Self-pay | Admitting: Physician Assistant

## 2023-07-10 DIAGNOSIS — N186 End stage renal disease: Secondary | ICD-10-CM | POA: Diagnosis not present

## 2023-07-10 DIAGNOSIS — Z992 Dependence on renal dialysis: Secondary | ICD-10-CM | POA: Diagnosis not present

## 2023-07-10 DIAGNOSIS — D631 Anemia in chronic kidney disease: Secondary | ICD-10-CM | POA: Diagnosis not present

## 2023-07-10 DIAGNOSIS — D509 Iron deficiency anemia, unspecified: Secondary | ICD-10-CM | POA: Diagnosis not present

## 2023-07-10 DIAGNOSIS — N2581 Secondary hyperparathyroidism of renal origin: Secondary | ICD-10-CM | POA: Diagnosis not present

## 2023-07-12 ENCOUNTER — Ambulatory Visit: Admitting: Family Medicine

## 2023-07-12 DIAGNOSIS — D509 Iron deficiency anemia, unspecified: Secondary | ICD-10-CM | POA: Diagnosis not present

## 2023-07-12 DIAGNOSIS — Z992 Dependence on renal dialysis: Secondary | ICD-10-CM | POA: Diagnosis not present

## 2023-07-12 DIAGNOSIS — D631 Anemia in chronic kidney disease: Secondary | ICD-10-CM | POA: Diagnosis not present

## 2023-07-12 DIAGNOSIS — N186 End stage renal disease: Secondary | ICD-10-CM | POA: Diagnosis not present

## 2023-07-12 DIAGNOSIS — N2581 Secondary hyperparathyroidism of renal origin: Secondary | ICD-10-CM | POA: Diagnosis not present

## 2023-07-15 DIAGNOSIS — N2581 Secondary hyperparathyroidism of renal origin: Secondary | ICD-10-CM | POA: Diagnosis not present

## 2023-07-15 DIAGNOSIS — N186 End stage renal disease: Secondary | ICD-10-CM | POA: Diagnosis not present

## 2023-07-15 DIAGNOSIS — D509 Iron deficiency anemia, unspecified: Secondary | ICD-10-CM | POA: Diagnosis not present

## 2023-07-15 DIAGNOSIS — D631 Anemia in chronic kidney disease: Secondary | ICD-10-CM | POA: Diagnosis not present

## 2023-07-15 DIAGNOSIS — Z992 Dependence on renal dialysis: Secondary | ICD-10-CM | POA: Diagnosis not present

## 2023-07-17 DIAGNOSIS — N186 End stage renal disease: Secondary | ICD-10-CM | POA: Diagnosis not present

## 2023-07-17 DIAGNOSIS — D631 Anemia in chronic kidney disease: Secondary | ICD-10-CM | POA: Diagnosis not present

## 2023-07-17 DIAGNOSIS — D509 Iron deficiency anemia, unspecified: Secondary | ICD-10-CM | POA: Diagnosis not present

## 2023-07-17 DIAGNOSIS — Z992 Dependence on renal dialysis: Secondary | ICD-10-CM | POA: Diagnosis not present

## 2023-07-17 DIAGNOSIS — N2581 Secondary hyperparathyroidism of renal origin: Secondary | ICD-10-CM | POA: Diagnosis not present

## 2023-07-19 DIAGNOSIS — Z992 Dependence on renal dialysis: Secondary | ICD-10-CM | POA: Diagnosis not present

## 2023-07-19 DIAGNOSIS — N2581 Secondary hyperparathyroidism of renal origin: Secondary | ICD-10-CM | POA: Diagnosis not present

## 2023-07-19 DIAGNOSIS — D509 Iron deficiency anemia, unspecified: Secondary | ICD-10-CM | POA: Diagnosis not present

## 2023-07-19 DIAGNOSIS — D631 Anemia in chronic kidney disease: Secondary | ICD-10-CM | POA: Diagnosis not present

## 2023-07-19 DIAGNOSIS — N186 End stage renal disease: Secondary | ICD-10-CM | POA: Diagnosis not present

## 2023-07-22 DIAGNOSIS — D509 Iron deficiency anemia, unspecified: Secondary | ICD-10-CM | POA: Diagnosis not present

## 2023-07-22 DIAGNOSIS — Z992 Dependence on renal dialysis: Secondary | ICD-10-CM | POA: Diagnosis not present

## 2023-07-22 DIAGNOSIS — N186 End stage renal disease: Secondary | ICD-10-CM | POA: Diagnosis not present

## 2023-07-22 DIAGNOSIS — N2581 Secondary hyperparathyroidism of renal origin: Secondary | ICD-10-CM | POA: Diagnosis not present

## 2023-07-22 DIAGNOSIS — D631 Anemia in chronic kidney disease: Secondary | ICD-10-CM | POA: Diagnosis not present

## 2023-07-24 DIAGNOSIS — N2581 Secondary hyperparathyroidism of renal origin: Secondary | ICD-10-CM | POA: Diagnosis not present

## 2023-07-24 DIAGNOSIS — N186 End stage renal disease: Secondary | ICD-10-CM | POA: Diagnosis not present

## 2023-07-24 DIAGNOSIS — Z992 Dependence on renal dialysis: Secondary | ICD-10-CM | POA: Diagnosis not present

## 2023-07-24 DIAGNOSIS — D509 Iron deficiency anemia, unspecified: Secondary | ICD-10-CM | POA: Diagnosis not present

## 2023-07-24 DIAGNOSIS — D631 Anemia in chronic kidney disease: Secondary | ICD-10-CM | POA: Diagnosis not present

## 2023-07-26 DIAGNOSIS — D631 Anemia in chronic kidney disease: Secondary | ICD-10-CM | POA: Diagnosis not present

## 2023-07-26 DIAGNOSIS — Z992 Dependence on renal dialysis: Secondary | ICD-10-CM | POA: Diagnosis not present

## 2023-07-26 DIAGNOSIS — N186 End stage renal disease: Secondary | ICD-10-CM | POA: Diagnosis not present

## 2023-07-26 DIAGNOSIS — N2581 Secondary hyperparathyroidism of renal origin: Secondary | ICD-10-CM | POA: Diagnosis not present

## 2023-07-26 DIAGNOSIS — D509 Iron deficiency anemia, unspecified: Secondary | ICD-10-CM | POA: Diagnosis not present

## 2023-07-28 DIAGNOSIS — N039 Chronic nephritic syndrome with unspecified morphologic changes: Secondary | ICD-10-CM | POA: Diagnosis not present

## 2023-07-28 DIAGNOSIS — N186 End stage renal disease: Secondary | ICD-10-CM | POA: Diagnosis not present

## 2023-07-28 DIAGNOSIS — Z992 Dependence on renal dialysis: Secondary | ICD-10-CM | POA: Diagnosis not present

## 2023-07-29 DIAGNOSIS — D509 Iron deficiency anemia, unspecified: Secondary | ICD-10-CM | POA: Diagnosis not present

## 2023-07-29 DIAGNOSIS — N186 End stage renal disease: Secondary | ICD-10-CM | POA: Diagnosis not present

## 2023-07-29 DIAGNOSIS — N2581 Secondary hyperparathyroidism of renal origin: Secondary | ICD-10-CM | POA: Diagnosis not present

## 2023-07-29 DIAGNOSIS — D631 Anemia in chronic kidney disease: Secondary | ICD-10-CM | POA: Diagnosis not present

## 2023-07-29 DIAGNOSIS — Z992 Dependence on renal dialysis: Secondary | ICD-10-CM | POA: Diagnosis not present

## 2023-07-31 DIAGNOSIS — N186 End stage renal disease: Secondary | ICD-10-CM | POA: Diagnosis not present

## 2023-07-31 DIAGNOSIS — D509 Iron deficiency anemia, unspecified: Secondary | ICD-10-CM | POA: Diagnosis not present

## 2023-07-31 DIAGNOSIS — N2581 Secondary hyperparathyroidism of renal origin: Secondary | ICD-10-CM | POA: Diagnosis not present

## 2023-07-31 DIAGNOSIS — Z992 Dependence on renal dialysis: Secondary | ICD-10-CM | POA: Diagnosis not present

## 2023-07-31 DIAGNOSIS — D631 Anemia in chronic kidney disease: Secondary | ICD-10-CM | POA: Diagnosis not present

## 2023-08-02 DIAGNOSIS — N186 End stage renal disease: Secondary | ICD-10-CM | POA: Diagnosis not present

## 2023-08-02 DIAGNOSIS — N2581 Secondary hyperparathyroidism of renal origin: Secondary | ICD-10-CM | POA: Diagnosis not present

## 2023-08-02 DIAGNOSIS — D509 Iron deficiency anemia, unspecified: Secondary | ICD-10-CM | POA: Diagnosis not present

## 2023-08-02 DIAGNOSIS — D631 Anemia in chronic kidney disease: Secondary | ICD-10-CM | POA: Diagnosis not present

## 2023-08-02 DIAGNOSIS — Z992 Dependence on renal dialysis: Secondary | ICD-10-CM | POA: Diagnosis not present

## 2023-08-05 DIAGNOSIS — D631 Anemia in chronic kidney disease: Secondary | ICD-10-CM | POA: Diagnosis not present

## 2023-08-05 DIAGNOSIS — D509 Iron deficiency anemia, unspecified: Secondary | ICD-10-CM | POA: Diagnosis not present

## 2023-08-05 DIAGNOSIS — N186 End stage renal disease: Secondary | ICD-10-CM | POA: Diagnosis not present

## 2023-08-05 DIAGNOSIS — Z992 Dependence on renal dialysis: Secondary | ICD-10-CM | POA: Diagnosis not present

## 2023-08-05 DIAGNOSIS — N2581 Secondary hyperparathyroidism of renal origin: Secondary | ICD-10-CM | POA: Diagnosis not present

## 2023-08-07 DIAGNOSIS — N2581 Secondary hyperparathyroidism of renal origin: Secondary | ICD-10-CM | POA: Diagnosis not present

## 2023-08-07 DIAGNOSIS — D631 Anemia in chronic kidney disease: Secondary | ICD-10-CM | POA: Diagnosis not present

## 2023-08-07 DIAGNOSIS — N186 End stage renal disease: Secondary | ICD-10-CM | POA: Diagnosis not present

## 2023-08-07 DIAGNOSIS — Z992 Dependence on renal dialysis: Secondary | ICD-10-CM | POA: Diagnosis not present

## 2023-08-07 DIAGNOSIS — D509 Iron deficiency anemia, unspecified: Secondary | ICD-10-CM | POA: Diagnosis not present

## 2023-08-09 DIAGNOSIS — D631 Anemia in chronic kidney disease: Secondary | ICD-10-CM | POA: Diagnosis not present

## 2023-08-09 DIAGNOSIS — N186 End stage renal disease: Secondary | ICD-10-CM | POA: Diagnosis not present

## 2023-08-09 DIAGNOSIS — N2581 Secondary hyperparathyroidism of renal origin: Secondary | ICD-10-CM | POA: Diagnosis not present

## 2023-08-09 DIAGNOSIS — Z992 Dependence on renal dialysis: Secondary | ICD-10-CM | POA: Diagnosis not present

## 2023-08-09 DIAGNOSIS — D509 Iron deficiency anemia, unspecified: Secondary | ICD-10-CM | POA: Diagnosis not present

## 2023-08-12 DIAGNOSIS — D631 Anemia in chronic kidney disease: Secondary | ICD-10-CM | POA: Diagnosis not present

## 2023-08-12 DIAGNOSIS — Z992 Dependence on renal dialysis: Secondary | ICD-10-CM | POA: Diagnosis not present

## 2023-08-12 DIAGNOSIS — N2581 Secondary hyperparathyroidism of renal origin: Secondary | ICD-10-CM | POA: Diagnosis not present

## 2023-08-12 DIAGNOSIS — D509 Iron deficiency anemia, unspecified: Secondary | ICD-10-CM | POA: Diagnosis not present

## 2023-08-12 DIAGNOSIS — N186 End stage renal disease: Secondary | ICD-10-CM | POA: Diagnosis not present

## 2023-08-14 DIAGNOSIS — N2581 Secondary hyperparathyroidism of renal origin: Secondary | ICD-10-CM | POA: Diagnosis not present

## 2023-08-14 DIAGNOSIS — D509 Iron deficiency anemia, unspecified: Secondary | ICD-10-CM | POA: Diagnosis not present

## 2023-08-14 DIAGNOSIS — D631 Anemia in chronic kidney disease: Secondary | ICD-10-CM | POA: Diagnosis not present

## 2023-08-14 DIAGNOSIS — N186 End stage renal disease: Secondary | ICD-10-CM | POA: Diagnosis not present

## 2023-08-14 DIAGNOSIS — Z992 Dependence on renal dialysis: Secondary | ICD-10-CM | POA: Diagnosis not present

## 2023-08-16 DIAGNOSIS — D631 Anemia in chronic kidney disease: Secondary | ICD-10-CM | POA: Diagnosis not present

## 2023-08-16 DIAGNOSIS — Z992 Dependence on renal dialysis: Secondary | ICD-10-CM | POA: Diagnosis not present

## 2023-08-16 DIAGNOSIS — N186 End stage renal disease: Secondary | ICD-10-CM | POA: Diagnosis not present

## 2023-08-16 DIAGNOSIS — D509 Iron deficiency anemia, unspecified: Secondary | ICD-10-CM | POA: Diagnosis not present

## 2023-08-16 DIAGNOSIS — N2581 Secondary hyperparathyroidism of renal origin: Secondary | ICD-10-CM | POA: Diagnosis not present

## 2023-08-19 DIAGNOSIS — Z992 Dependence on renal dialysis: Secondary | ICD-10-CM | POA: Diagnosis not present

## 2023-08-19 DIAGNOSIS — N2581 Secondary hyperparathyroidism of renal origin: Secondary | ICD-10-CM | POA: Diagnosis not present

## 2023-08-19 DIAGNOSIS — D509 Iron deficiency anemia, unspecified: Secondary | ICD-10-CM | POA: Diagnosis not present

## 2023-08-19 DIAGNOSIS — N186 End stage renal disease: Secondary | ICD-10-CM | POA: Diagnosis not present

## 2023-08-19 DIAGNOSIS — D631 Anemia in chronic kidney disease: Secondary | ICD-10-CM | POA: Diagnosis not present

## 2023-08-21 DIAGNOSIS — N2581 Secondary hyperparathyroidism of renal origin: Secondary | ICD-10-CM | POA: Diagnosis not present

## 2023-08-21 DIAGNOSIS — Z992 Dependence on renal dialysis: Secondary | ICD-10-CM | POA: Diagnosis not present

## 2023-08-21 DIAGNOSIS — D509 Iron deficiency anemia, unspecified: Secondary | ICD-10-CM | POA: Diagnosis not present

## 2023-08-21 DIAGNOSIS — D631 Anemia in chronic kidney disease: Secondary | ICD-10-CM | POA: Diagnosis not present

## 2023-08-21 DIAGNOSIS — N186 End stage renal disease: Secondary | ICD-10-CM | POA: Diagnosis not present

## 2023-08-23 DIAGNOSIS — N2581 Secondary hyperparathyroidism of renal origin: Secondary | ICD-10-CM | POA: Diagnosis not present

## 2023-08-23 DIAGNOSIS — D631 Anemia in chronic kidney disease: Secondary | ICD-10-CM | POA: Diagnosis not present

## 2023-08-23 DIAGNOSIS — N186 End stage renal disease: Secondary | ICD-10-CM | POA: Diagnosis not present

## 2023-08-23 DIAGNOSIS — D509 Iron deficiency anemia, unspecified: Secondary | ICD-10-CM | POA: Diagnosis not present

## 2023-08-23 DIAGNOSIS — Z992 Dependence on renal dialysis: Secondary | ICD-10-CM | POA: Diagnosis not present

## 2023-08-26 DIAGNOSIS — D509 Iron deficiency anemia, unspecified: Secondary | ICD-10-CM | POA: Diagnosis not present

## 2023-08-26 DIAGNOSIS — D631 Anemia in chronic kidney disease: Secondary | ICD-10-CM | POA: Diagnosis not present

## 2023-08-26 DIAGNOSIS — N186 End stage renal disease: Secondary | ICD-10-CM | POA: Diagnosis not present

## 2023-08-26 DIAGNOSIS — N2581 Secondary hyperparathyroidism of renal origin: Secondary | ICD-10-CM | POA: Diagnosis not present

## 2023-08-26 DIAGNOSIS — Z992 Dependence on renal dialysis: Secondary | ICD-10-CM | POA: Diagnosis not present

## 2023-08-27 DIAGNOSIS — N039 Chronic nephritic syndrome with unspecified morphologic changes: Secondary | ICD-10-CM | POA: Diagnosis not present

## 2023-08-27 DIAGNOSIS — N186 End stage renal disease: Secondary | ICD-10-CM | POA: Diagnosis not present

## 2023-08-27 DIAGNOSIS — Z992 Dependence on renal dialysis: Secondary | ICD-10-CM | POA: Diagnosis not present

## 2023-08-28 DIAGNOSIS — D509 Iron deficiency anemia, unspecified: Secondary | ICD-10-CM | POA: Diagnosis not present

## 2023-08-28 DIAGNOSIS — N2581 Secondary hyperparathyroidism of renal origin: Secondary | ICD-10-CM | POA: Diagnosis not present

## 2023-08-28 DIAGNOSIS — D631 Anemia in chronic kidney disease: Secondary | ICD-10-CM | POA: Diagnosis not present

## 2023-08-28 DIAGNOSIS — Z992 Dependence on renal dialysis: Secondary | ICD-10-CM | POA: Diagnosis not present

## 2023-08-28 DIAGNOSIS — N186 End stage renal disease: Secondary | ICD-10-CM | POA: Diagnosis not present

## 2023-08-29 ENCOUNTER — Ambulatory Visit: Admitting: Podiatry

## 2023-08-30 DIAGNOSIS — D631 Anemia in chronic kidney disease: Secondary | ICD-10-CM | POA: Diagnosis not present

## 2023-08-30 DIAGNOSIS — N186 End stage renal disease: Secondary | ICD-10-CM | POA: Diagnosis not present

## 2023-08-30 DIAGNOSIS — D509 Iron deficiency anemia, unspecified: Secondary | ICD-10-CM | POA: Diagnosis not present

## 2023-08-30 DIAGNOSIS — N2581 Secondary hyperparathyroidism of renal origin: Secondary | ICD-10-CM | POA: Diagnosis not present

## 2023-08-30 DIAGNOSIS — Z992 Dependence on renal dialysis: Secondary | ICD-10-CM | POA: Diagnosis not present

## 2023-09-02 DIAGNOSIS — D631 Anemia in chronic kidney disease: Secondary | ICD-10-CM | POA: Diagnosis not present

## 2023-09-02 DIAGNOSIS — N186 End stage renal disease: Secondary | ICD-10-CM | POA: Diagnosis not present

## 2023-09-02 DIAGNOSIS — Z992 Dependence on renal dialysis: Secondary | ICD-10-CM | POA: Diagnosis not present

## 2023-09-02 DIAGNOSIS — N2581 Secondary hyperparathyroidism of renal origin: Secondary | ICD-10-CM | POA: Diagnosis not present

## 2023-09-02 DIAGNOSIS — D509 Iron deficiency anemia, unspecified: Secondary | ICD-10-CM | POA: Diagnosis not present

## 2023-09-03 ENCOUNTER — Encounter: Payer: Self-pay | Admitting: Podiatry

## 2023-09-03 ENCOUNTER — Ambulatory Visit (INDEPENDENT_AMBULATORY_CARE_PROVIDER_SITE_OTHER): Admitting: Podiatry

## 2023-09-03 DIAGNOSIS — M79672 Pain in left foot: Secondary | ICD-10-CM

## 2023-09-03 DIAGNOSIS — M79671 Pain in right foot: Secondary | ICD-10-CM | POA: Diagnosis not present

## 2023-09-03 DIAGNOSIS — B351 Tinea unguium: Secondary | ICD-10-CM

## 2023-09-03 NOTE — Progress Notes (Signed)
 Patient presents for evaluation and treatment of tenderness and some redness around nails feet.  Tenderness around toes with walking and wearing shoes.  Physical exam:  General appearance: Alert, pleasant, and in no acute distress.  Vascular: Pedal pulses: DP 2/4 B/L, PT 0/4 B/L.  Mild edema lower legs bilaterally  Neurological:    Dermatologic:  Nails thickened, disfigured, discolored 1-5 BL with subungual debris.  Redness and hypertrophic nail folds along nail folds bilaterally but no signs of drainage or infection.  Musculoskeletal:     Diagnosis: 1. Painful onychomycotic nails 1 through 5 bilaterally. 2. Pain toes 1 through 5 bilaterally.  Plan: Debrided onychomycotic nails 1 through 5 bilaterally.  Return 3 months

## 2023-09-04 ENCOUNTER — Ambulatory Visit: Admitting: Family Medicine

## 2023-09-04 ENCOUNTER — Encounter: Payer: Self-pay | Admitting: Family Medicine

## 2023-09-04 VITALS — BP 120/82 | HR 63 | Temp 98.2°F | Ht 59.0 in | Wt 138.0 lb

## 2023-09-04 DIAGNOSIS — R269 Unspecified abnormalities of gait and mobility: Secondary | ICD-10-CM | POA: Diagnosis not present

## 2023-09-04 DIAGNOSIS — Z992 Dependence on renal dialysis: Secondary | ICD-10-CM

## 2023-09-04 DIAGNOSIS — N2581 Secondary hyperparathyroidism of renal origin: Secondary | ICD-10-CM | POA: Diagnosis not present

## 2023-09-04 DIAGNOSIS — M545 Low back pain, unspecified: Secondary | ICD-10-CM | POA: Diagnosis not present

## 2023-09-04 DIAGNOSIS — Z95811 Presence of heart assist device: Secondary | ICD-10-CM

## 2023-09-04 DIAGNOSIS — W19XXXA Unspecified fall, initial encounter: Secondary | ICD-10-CM

## 2023-09-04 DIAGNOSIS — D509 Iron deficiency anemia, unspecified: Secondary | ICD-10-CM | POA: Diagnosis not present

## 2023-09-04 DIAGNOSIS — N186 End stage renal disease: Secondary | ICD-10-CM

## 2023-09-04 DIAGNOSIS — D631 Anemia in chronic kidney disease: Secondary | ICD-10-CM | POA: Diagnosis not present

## 2023-09-04 MED ORDER — LIDOCAINE 5 % EX PTCH: 1.0000 | MEDICATED_PATCH | CUTANEOUS | 0 refills | Status: AC

## 2023-09-04 MED ORDER — TRAMADOL HCL 50 MG PO TABS: 50.0000 mg | ORAL_TABLET | Freq: Two times a day (BID) | ORAL | 0 refills | Status: DC | PRN

## 2023-09-04 NOTE — Progress Notes (Signed)
 I,[name], CMA,acting as a Neurosurgeon for [employer], NP.,have documented all relevant documentation on the behalf of [name] Creighton, NP,as directed by  [name] Creighton, NP while in the presence of [name] Creighton, NP.  Subjective:  Patient ID: [name] Marks , female    DOB: [birth year] , 68 y.o.   MRN: [id number]  Chief Complaint  Patient presents with   Fall    HPI  Patient is a 68 year old female who presents today for a recent fall about 5 days ago. She stated she fell off her porch and onto her back and it has exacerbated her back pain. Patient has ESRD and is on dialysis. She goes M,W, F at [facility], Sees a Cardiologist Dr Shlomo.     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   CVA (cerebral vascular accident) (HCC) 05/16/2016   Dialysis patient (HCC)    End stage renal disease on dialysis (HCC)    MWF; [address] (01/03/2017)   Endocarditis    Esophageal reflux 12/06/2008   GERD (gastroesophageal reflux disease)    Gout    History of blood transfusion 2017   related to blood poison (01/03/2017)   Increased endometrial stripe thickness 11/03/2015   Neck pain    Osteoporosis 09/2016   T score -2.6   Pain and swelling of right upper extremity 12/20/2015   Pulmonary HTN (HCC) 07/12/2016   Renal dialysis device, implant, or graft complication 12/20/2015   Renal insufficiency    S/P cholecystectomy 02/14/2017   Septic shock (HCC)    Staphylococcus aureus bacteremia     TIA (transient ischemic attack)    several (01/03/2017)     Family History  Problem Relation Age of Onset   Diabetes Mother    Hypertension Mother    Heart disease Mother    Alcohol abuse Mother    Kidney disease Mother    Cancer Father        COLON   Alcohol abuse Father    Breast cancer Sister 27   Hypertension Sister    Kidney disease Sister    Breast cancer Sister        breast cancer   Hypertension Son    Kidney disease Son      Current Outpatient Medications:    lidocaine  (LIDODERM ) 5 %, Place 1 patch onto the skin daily for 10 days. Remove & Discard patch within 12 hours or as directed by MD, Disp: 10 patch, Rfl: 0   cinacalcet  (SENSIPAR ) 90 MG tablet, Take 90 mg by mouth at bedtime. (Patient not taking: Reported on 09/04/2023), Disp: , Rfl:    clopidogrel  (PLAVIX ) 75 MG tablet, TAKE 1 TABLET BY MOUTH EVERY DAY (Patient not taking: Reported on 09/04/2023), Disp: 90 tablet, Rfl: 1   famotidine  (PEPCID ) 20 MG tablet, Take 20 mg by mouth daily after supper. (Patient not taking: Reported on 09/04/2023), Disp: , Rfl:    fluticasone  (FLONASE ) 50 MCG/ACT nasal spray, Place 2 sprays  into both nostrils daily. (Patient not taking: Reported on 09/04/2023), Disp: 9.9 mL, Rfl: 2   Loratadine  5 MG TBDP, Take 1 tablet (5 mg total) by mouth daily. (Patient not taking: Reported on 09/04/2023), Disp: 90 tablet, Rfl: 0   midodrine  (PROAMATINE ) 10 MG tablet, Take 10 mg by mouth See admin instructions. Take 1 tablet (10 mg) by mouth before dialysis & take 1 tablet (10 mg) while at dialysis on Mondays, Wednesdays & Fridays. (Patient not taking: Reported on 09/04/2023), Disp: , Rfl:    ondansetron  (ZOFRAN -ODT) 4 MG disintegrating tablet, Take 1 tablet (4 mg total) by mouth every 8 (eight) hours as needed for nausea or vomiting. (Patient not taking: Reported on 09/04/2023), Disp: 20 tablet, Rfl: 0   predniSONE  (DELTASONE ) 20 MG tablet, Take 2 tablets (40 mg total) by mouth daily. (Patient not taking: Reported on  09/04/2023), Disp: 10 tablet, Rfl: 0   sevelamer  carbonate (RENVELA ) 800 MG tablet, Take 2,400 mg by mouth See admin instructions. Take 3 tablets (2400 mg) by mouth after each meal and after each snack (Patient not taking: Reported on 09/04/2023), Disp: , Rfl:    traMADol  (ULTRAM ) 50 MG tablet, Take 1 tablet (50 mg total) by mouth every 12 (twelve) hours as needed., Disp: 20 tablet, Rfl: 0   Allergies  Allergen Reactions   Iodinated Contrast Media Anaphylaxis and Hives   Cefazolin  Nausea And Vomiting   Diphenhydramine  Other (See Comments)   Naproxen  Sodium Itching     Review of Systems  Constitutional: Negative.   HENT: Negative.    Musculoskeletal:  Positive for back pain and gait problem.       Came in with a cane today  Skin: Negative.   Neurological:  Negative for seizures and facial asymmetry.  Psychiatric/Behavioral: Negative.       Today's Vitals   09/04/23 1144  BP: 120/82  Pulse: 63  Temp: 98.2 F (36.8 C)  TempSrc: Oral  Weight: 138 lb (62.6 kg)  Height: 4' 11 (1.499 m)  PainSc: 3   PainLoc: Back   Body mass index is 27.87 kg/m.  Wt Readings from Last 3 Encounters:  09/06/23 135 lb (61.2 kg)  09/04/23 138 lb (62.6 kg)  06/04/23 141 lb 1.5 oz (64 kg)    The ASCVD Risk score (Arnett DK, et al., 2019) failed to calculate for the following reasons:   Risk score cannot be calculated because patient has a medical history suggesting prior/existing ASCVD  Objective:  Physical Exam HENT:     Head: Normocephalic.  Cardiovascular:     Rate and Rhythm: Normal rate and regular rhythm.  Pulmonary:     Effort: Pulmonary effort is normal.     Breath sounds: Normal breath sounds.  Musculoskeletal:        General: Tenderness present.  Neurological:     General: No focal deficit present.     Mental Status: She is alert and oriented to person, place, and time.  Psychiatric:        Mood and Affect: Mood normal.        Behavior: Behavior normal.         Assessment  And Plan:  Gait difficulty Assessment & Plan: Tripped and fell 5 days ago, referred to PT.  Orders: -     Ambulatory referral to Physical Therapy  Presence of heart assist device (HCC)  Lumbar pain -     Lidocaine ; Place 1 patch onto the skin daily for 10 days. Remove & Discard patch within  12 hours or as directed by MD  Dispense: 10 patch; Refill: 0 -     Ambulatory referral to Physical Therapy -     traMADol  HCl; Take 1 tablet (50 mg total) by mouth every 12 (twelve) hours as needed.  Dispense: 20 tablet; Refill: 0  ESRD (end stage renal disease) on dialysis [facility]) Assessment & Plan: Dialysis M, W, F       Return in 4 months (on 01/05/2024), or if symptoms worsen or fail to improve, for physical.  Patient was given opportunity to ask questions. Patient verbalized understanding of the plan and was able to repeat key elements of the plan. All questions were answered to their satisfaction.   I, [name] Creighton, NP, have reviewed all documentation for this visit. The documentation on 09/09/2023 for the exam, diagnosis, procedures, and orders are all accurate and complete.    IF YOU HAVE BEEN REFERRED TO A SPECIALIST, IT MAY TAKE 1-2 WEEKS TO SCHEDULE/PROCESS THE REFERRAL. IF YOU HAVE NOT HEARD FROM US /SPECIALIST IN TWO WEEKS, PLEASE GIVE US  A CALL AT [phone].

## 2023-09-06 ENCOUNTER — Encounter (HOSPITAL_COMMUNITY): Payer: Self-pay | Admitting: *Deleted

## 2023-09-06 ENCOUNTER — Emergency Department (HOSPITAL_COMMUNITY): Admission: EM | Admit: 2023-09-06 | Discharge: 2023-09-06 | Disposition: A | Discharge status: Home / Self Care

## 2023-09-06 ENCOUNTER — Other Ambulatory Visit: Payer: Self-pay

## 2023-09-06 DIAGNOSIS — T82838A Hemorrhage of vascular prosthetic devices, implants and grafts, initial encounter: Secondary | ICD-10-CM | POA: Diagnosis not present

## 2023-09-06 DIAGNOSIS — R58 Hemorrhage, not elsewhere classified: Secondary | ICD-10-CM | POA: Diagnosis not present

## 2023-09-06 DIAGNOSIS — D631 Anemia in chronic kidney disease: Secondary | ICD-10-CM | POA: Diagnosis not present

## 2023-09-06 DIAGNOSIS — D509 Iron deficiency anemia, unspecified: Secondary | ICD-10-CM | POA: Diagnosis not present

## 2023-09-06 DIAGNOSIS — N2581 Secondary hyperparathyroidism of renal origin: Secondary | ICD-10-CM | POA: Diagnosis not present

## 2023-09-06 DIAGNOSIS — Z992 Dependence on renal dialysis: Secondary | ICD-10-CM | POA: Insufficient documentation

## 2023-09-06 DIAGNOSIS — I959 Hypotension, unspecified: Secondary | ICD-10-CM | POA: Diagnosis not present

## 2023-09-06 DIAGNOSIS — N186 End stage renal disease: Secondary | ICD-10-CM | POA: Insufficient documentation

## 2023-09-06 DIAGNOSIS — L7622 Postprocedural hemorrhage and hematoma of skin and subcutaneous tissue following other procedure: Secondary | ICD-10-CM | POA: Diagnosis not present

## 2023-09-06 LAB — CBC
HCT: 33.6 % — ABNORMAL LOW (ref 36.0–46.0)
Hemoglobin: 10.9 g/dL — ABNORMAL LOW (ref 12.0–15.0)
MCH: 30.4 pg (ref 26.0–34.0)
MCHC: 32.4 g/dL (ref 30.0–36.0)
MCV: 93.9 fL (ref 80.0–100.0)
Platelets: 117 K/uL — ABNORMAL LOW (ref 150–400)
RBC: 3.58 MIL/uL — ABNORMAL LOW (ref 3.87–5.11)
RDW: 15.5 % (ref 11.5–15.5)
WBC: 4.5 K/uL (ref 4.0–10.5)
nRBC: 0 % (ref 0.0–0.2)

## 2023-09-06 LAB — BASIC METABOLIC PANEL WITH GFR
Anion gap: 12 (ref 5–15)
BUN: <5 mg/dL — ABNORMAL LOW (ref 8–23)
CO2: 24 mmol/L (ref 22–32)
Calcium: 9.3 mg/dL (ref 8.9–10.3)
Chloride: 101 mmol/L (ref 98–111)
Creatinine, Ser: 3.7 mg/dL — ABNORMAL HIGH (ref 0.44–1.00)
GFR, Estimated: 13 mL/min — ABNORMAL LOW (ref >60)
Glucose, Bld: 80 mg/dL (ref 70–99)
Potassium: 3.9 mmol/L (ref 3.5–5.1)
Sodium: 137 mmol/L (ref 135–145)

## 2023-09-06 NOTE — ED Notes (Signed)
No new bleeding noted.  

## 2023-09-06 NOTE — ED Provider Notes (Signed)
 [place] EMERGENCY DEPARTMENT AT [facility] Provider Note   CSN: [id number] Arrival date & time: 09/06/23  1100     Patient presents with: Vascular Access Problem   [name] Marks is a 68 y.o. female.   68 year old female with past medical history of end-stage renal disease presenting to the emergency department today with bleeding from her AV fistula.  The patient had dialysis and apparently had some bleeding there.  This was stopped with direct pressure.  When she got home she was getting dressed and noticed that she felt something running down her leg.  When she looked she was bleeding heavily.  Medics were called that time.  The patient denies any lightheadedness but states that she is feeling cold and generally weak.  She reports that she did lose a lot of blood earlier.  Medics applied direct pressure and she was brought to the ER at that time.  She is on Plavix .        Prior to Admission medications   Medication Sig Start Date End Date Taking? Authorizing Provider  cinacalcet  (SENSIPAR ) 90 MG tablet Take 90 mg by mouth at bedtime. Patient not taking: Reported on 09/04/2023    [provider]  clopidogrel  (PLAVIX ) 75 MG tablet TAKE 1 TABLET BY MOUTH EVERY DAY Patient not taking: Reported on 09/04/2023 07/10/23   Lucien Orren SAILOR, PA-C  famotidine  (PEPCID ) 20 MG tablet Take 20 mg by mouth daily after supper. Patient not taking: Reported on 09/04/2023    [provider]  fluticasone  (FLONASE ) 50 MCG/ACT nasal spray Place 2 sprays into both nostrils daily. Patient not taking: Reported on 09/04/2023 04/20/23   Teresa Almarie LABOR, PA-C  lidocaine  (LIDODERM ) 5 % Place 1 patch onto the skin daily for 10 days. Remove & Discard patch within 12 hours or as directed by MD 09/04/23 09/14/23  Petrina Pries, NP  Loratadine  5 MG TBDP Take 1 tablet (5 mg total) by mouth daily. Patient not taking: Reported on 09/04/2023 05/30/23   Petrina Pries, NP  midodrine  (PROAMATINE ) 10 MG  tablet Take 10 mg by mouth See admin instructions. Take 1 tablet (10 mg) by mouth before dialysis & take 1 tablet (10 mg) while at dialysis on Mondays, Wednesdays & Fridays. Patient not taking: Reported on 09/04/2023 08/15/20   [provider]  ondansetron  (ZOFRAN -ODT) 4 MG disintegrating tablet Take 1 tablet (4 mg total) by mouth every 8 (eight) hours as needed for nausea or vomiting. Patient not taking: Reported on 09/04/2023 10/31/22   Teresa Shelba SAUNDERS, NP  predniSONE  (DELTASONE ) 20 MG tablet Take 2 tablets (40 mg total) by mouth daily. Patient not taking: Reported on 09/04/2023 06/04/23   Barrett, Warren SAILOR, PA-C  sevelamer  carbonate (RENVELA ) 800 MG tablet Take 2,400 mg by mouth See admin instructions. Take 3 tablets (2400 mg) by mouth after each meal and after each snack Patient not taking: Reported on 09/04/2023    [provider]  traMADol  (ULTRAM ) 50 MG tablet Take 1 tablet (50 mg total) by mouth every 12 (twelve) hours as needed. 09/04/23   Petrina Pries, NP    Allergies: Iodinated contrast media, Cefazolin , Diphenhydramine , and Naproxen  sodium    Review of Systems  Constitutional:  Positive for fatigue.  All other systems reviewed and are negative.   Updated Vital Signs BP (!) 107/52 (BP Location: Right Arm)   Pulse (!) 54   Temp 98 F (36.7 C) (Oral)   Resp 18   Ht 4' 11 (1.499 m)  Wt 61.2 kg   SpO2 100%   BMI 27.27 kg/m   Physical Exam Vitals and nursing note reviewed.   Gen: NAD, chronically ill-appearing Eyes: PERRL, EOMI HEENT: no oropharyngeal swelling Neck: trachea midline Resp: clear to auscultation bilaterally Card: RRR, no murmurs, rubs, or gallops Abd: nontender, nondistended Extremities: no calf tenderness, no edema, when the bandage was taken down there is persistent oozing but no pulsatile bleeding over the left femoral AV fistula Vascular: 2+ radial pulses bilaterally, 2+ DP pulses bilaterally Skin: no rashes Psyc: acting appropriately   (all  labs ordered are listed, but only abnormal results are displayed) Labs Reviewed  CBC - Abnormal; Notable for the following components:      Result Value   RBC 3.58 (*)    Hemoglobin 10.9 (*)    HCT 33.6 (*)    Platelets 117 (*)    All other components within normal limits  BASIC METABOLIC PANEL WITH GFR - Abnormal; Notable for the following components:   BUN <5 (*)    Creatinine, Ser 3.70 (*)    GFR, Estimated 13 (*)    All other components within normal limits    EKG: None  Radiology: No results found.   Procedures   Medications Ordered in the ED - No data to display                                  Medical Decision Making 68 year old female with past medical history of end-stage renal disease presenting to the emergency department today with bleeding from her AV fistula.  Will obtain basic labs here to evaluate for anemia.  Quick clot was applied here over the wound and this is wrapped in an Ace bandage.  There is no obvious continued bleeding.  Will observe the patient here and reevaluate for ultimate disposition.  The patient's labs here are stable.  The quick clot was removed as well as the pressure dressing and the patient did not have any further bleeding.  She was placed in a looser Ace wrap and will be discharged with return precautions.  Her dialysis shunt has palpable thrill and audible bruit on reassessment.  Amount and/or Complexity of Data Reviewed Labs: ordered.        Final diagnoses:  Bleeding from dialysis shunt, initial encounter [facility])    ED Discharge Orders     None          Ula Prentice SAUNDERS, MD 09/06/23 1343

## 2023-09-06 NOTE — Discharge Instructions (Signed)
 Please follow-up for dialysis as scheduled for your next dialysis session.  Return to the emergency department for recurrent bleeding or worsening symptoms.

## 2023-09-06 NOTE — ED Notes (Signed)
 Patient Alert and oriented to baseline. Stable and ambulatory to baseline. Patient verbalized understanding of the discharge instructions.  Patient belongings were taken by the patient.

## 2023-09-06 NOTE — ED Triage Notes (Signed)
 Patient presents to ed via [facility] from home states she went to dailysis this am was going fine went home was changing clothes and felt something warm on her leg. Looked down and her graft in her left upper thigh was bleeding. Per ems very difficult to control was spurting  direct pressure applied. Upon arrival ems was holding direct pressure on the thigh good left pedal pulse. EDP at bedside.

## 2023-09-09 DIAGNOSIS — N186 End stage renal disease: Secondary | ICD-10-CM | POA: Diagnosis not present

## 2023-09-09 DIAGNOSIS — N2581 Secondary hyperparathyroidism of renal origin: Secondary | ICD-10-CM | POA: Diagnosis not present

## 2023-09-09 DIAGNOSIS — Z992 Dependence on renal dialysis: Secondary | ICD-10-CM | POA: Diagnosis not present

## 2023-09-09 DIAGNOSIS — D509 Iron deficiency anemia, unspecified: Secondary | ICD-10-CM | POA: Diagnosis not present

## 2023-09-09 DIAGNOSIS — R269 Unspecified abnormalities of gait and mobility: Secondary | ICD-10-CM | POA: Insufficient documentation

## 2023-09-09 DIAGNOSIS — D631 Anemia in chronic kidney disease: Secondary | ICD-10-CM | POA: Diagnosis not present

## 2023-09-09 DIAGNOSIS — M545 Low back pain, unspecified: Secondary | ICD-10-CM | POA: Insufficient documentation

## 2023-09-09 NOTE — Assessment & Plan Note (Signed)
 Tripped and fell 5 days ago, referred to PT.

## 2023-09-09 NOTE — Assessment & Plan Note (Signed)
 Dialysis M,W,F.

## 2023-09-11 DIAGNOSIS — N2581 Secondary hyperparathyroidism of renal origin: Secondary | ICD-10-CM | POA: Diagnosis not present

## 2023-09-11 DIAGNOSIS — N186 End stage renal disease: Secondary | ICD-10-CM | POA: Diagnosis not present

## 2023-09-11 DIAGNOSIS — Z992 Dependence on renal dialysis: Secondary | ICD-10-CM | POA: Diagnosis not present

## 2023-09-11 DIAGNOSIS — D509 Iron deficiency anemia, unspecified: Secondary | ICD-10-CM | POA: Diagnosis not present

## 2023-09-11 DIAGNOSIS — D631 Anemia in chronic kidney disease: Secondary | ICD-10-CM | POA: Diagnosis not present

## 2023-09-12 ENCOUNTER — Encounter (HOSPITAL_COMMUNITY): Payer: Self-pay

## 2023-09-12 ENCOUNTER — Ambulatory Visit (HOSPITAL_COMMUNITY)
Admission: EM | Admit: 2023-09-12 | Discharge: 2023-09-12 | Disposition: A | Discharge status: Home / Self Care | Attending: Family Medicine | Admitting: Family Medicine

## 2023-09-12 DIAGNOSIS — R5383 Other fatigue: Secondary | ICD-10-CM | POA: Insufficient documentation

## 2023-09-12 DIAGNOSIS — M2550 Pain in unspecified joint: Secondary | ICD-10-CM | POA: Insufficient documentation

## 2023-09-12 LAB — CBC
HCT: 29.3 % — ABNORMAL LOW (ref 36.0–46.0)
Hemoglobin: 9.5 g/dL — ABNORMAL LOW (ref 12.0–15.0)
MCH: 30.4 pg (ref 26.0–34.0)
MCHC: 32.4 g/dL (ref 30.0–36.0)
MCV: 93.9 fL (ref 80.0–100.0)
Platelets: 147 K/uL — ABNORMAL LOW (ref 150–400)
RBC: 3.12 MIL/uL — ABNORMAL LOW (ref 3.87–5.11)
RDW: 16 % — ABNORMAL HIGH (ref 11.5–15.5)
WBC: 5.1 K/uL (ref 4.0–10.5)
nRBC: 0 % (ref 0.0–0.2)

## 2023-09-12 LAB — BASIC METABOLIC PANEL WITH GFR
Anion gap: 11 (ref 5–15)
BUN: 10 mg/dL (ref 8–23)
CO2: 27 mmol/L (ref 22–32)
Calcium: 9.3 mg/dL (ref 8.9–10.3)
Chloride: 97 mmol/L — ABNORMAL LOW (ref 98–111)
Creatinine, Ser: 5.3 mg/dL — ABNORMAL HIGH (ref 0.44–1.00)
GFR, Estimated: 8 mL/min — ABNORMAL LOW (ref >60)
Glucose, Bld: 73 mg/dL (ref 70–99)
Potassium: 4.4 mmol/L (ref 3.5–5.1)
Sodium: 135 mmol/L (ref 135–145)

## 2023-09-12 MED ORDER — PREDNISONE 20 MG PO TABS: 40.0000 mg | ORAL_TABLET | Freq: Every day | ORAL | 0 refills | Status: AC

## 2023-09-12 NOTE — ED Triage Notes (Signed)
 Patient reports that she had a fall approx 2 weeks ago. Patient states she went to her [facility] on 09/04/23 for gait issues. Patient states she was ordered physical therapy and some medications for her chronic back pain. Patient also c/o bilateral hand swelling x 1 month while there and states she was told it was arthritis. Patient states she began having joint pain all over this week.   Patient had dialysis yesterday.

## 2023-09-12 NOTE — ED Provider Notes (Signed)
 [facility]    CSN: [id number] Arrival date & time: 09/12/23  1134      History   Chief Complaint Chief Complaint  Patient presents with   Joint Pain   hand swelling   Gait Problem    HPI [name] Marks is a 68 y.o. female.   HPI  Here for bilateral hand pain.  She states she has chronic trouble with arthritis and carpal tunnel but her hands are feeling worse lately.  Also her right shoulder is bothering her little bit.  About 2 weeks ago she just fell backwards for some reason.  She did not lose consciousness and does not note any head injury.  She does have dialysis 3 days a week for renal failure and also takes midodrine  for low blood pressures.  Her last dialysis was yesterday and her blood pressure here is 98 systolic.  She is allergic to Keflex, Benadryl , naproxen , and iodinated contrast  She states she just has not felt good lately. 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   CVA (cerebral vascular accident) (HCC) 05/16/2016   Dialysis patient (HCC)    End stage renal disease on dialysis (HCC)    MWF; [address] (01/03/2017)   Endocarditis    Esophageal reflux 12/06/2008   GERD (gastroesophageal reflux disease)    Gout    History of blood transfusion 2017   related to blood poison (01/03/2017)   Increased endometrial stripe thickness 11/03/2015   Neck pain    Osteoporosis 09/2016   T score -2.6   Pain and swelling of right upper extremity 12/20/2015    Pulmonary HTN (HCC) 07/12/2016   Renal dialysis device, implant, or graft complication 12/20/2015   Renal insufficiency    S/P cholecystectomy 02/14/2017   Septic shock (HCC)    Staphylococcus aureus bacteremia    TIA (transient ischemic attack)    several (01/03/2017)    Patient Active Problem List   Diagnosis Date Noted   Gait difficulty 09/09/2023   Lumbar pain 09/09/2023   Presence of heart assist device (HCC) 09/04/2023   Seasonal allergic rhinitis due to pollen 05/30/2023   Sensorineural hearing loss (SNHL) of both ears 05/30/2023   S/P aortic valve replacement 05/30/2023   History of colonic polyps 05/16/2021   Endocarditis 07/30/2020   Hyperparathyroidism due to renal insufficiency (HCC) 07/30/2020   Tooth, broken 12/18/2018   Hypotension 10/03/2018   Diarrhea, unspecified 08/25/2018   Orthostatic hypotension 03/14/2018   S/P cholecystectomy 02/14/2017   Osteoporosis 12/11/2016   Cholelithiasis 11/07/2016   Cerebral embolism with transient ischemic attack (TIA)    Bilateral carpal tunnel syndrome 08/09/2016   AVD (aortic valve disease) 07/12/2016   Pulmonary HTN (HCC) 07/12/2016   Carpal tunnel syndrome 07/05/2016   Disorder of both mastoids 05/29/2016   CVA (cerebral vascular accident) (HCC) 05/16/2016   History of aortic valve replacement with bioprosthetic valve 05/08/2016   Bacteremia due to coagulase-negative Staphylococcus    History of endocarditis  Anemia    Complex endometrial hyperplasia with atypia 11/08/2015   Dependence on renal dialysis (HCC) 12/15/2014   Obesity 10/28/2014   Right carotid bruit 01/08/2013   Generalized headaches 01/08/2013   Chronic female pelvic pain 08/08/2012   Gout, unspecified 12/06/2008   Esophageal reflux 12/06/2008   Backache 12/06/2008   FIBROIDS, UTERUS 11/24/2008   Diverticulosis of large intestine without perforation or abscess without bleeding 03/22/2008   ESRD (end stage renal disease) on dialysis (HCC)  07/01/2006   Anemia in chronic kidney disease 12/18/2001    Past Surgical History:  Procedure Laterality Date   AORTIC VALVE REPLACEMENT N/A 02/25/2016   Procedure: AORTIC VALVE REPLACEMENT (AVR) implanted with Magna Ease Aortic valve size 21mm;  Surgeon: Elspeth JAYSON Millers, MD;  Location: [facility] OR;  Service: Open Heart Surgery;  Laterality: N/A;   AV FISTULA PLACEMENT Left 01/03/2017   Procedure: INSERTION OF ARTERIOVENOUS (AV) GORE-TEX GRAFT LEFT THIGH;  Surgeon: Serene Gaile ORN, MD;  Location: [facility] OR;  Service: Vascular;  Laterality: Left;   AVGG REMOVAL Right 02/17/2016   Procedure: REMOVAL OF TWO ARTERIOVENOUS GORETEX GRAFTS (AVGG);  Surgeon: Lonni GORMAN Blade, MD;  Location: [facility] OR;  Service: Vascular;  Laterality: Right;   BIOPSY  05/16/2021   Procedure: BIOPSY;  Surgeon: Dianna Specking, MD;  Location: [facility] ENDOSCOPY;  Service: Endoscopy;;   BREAST BIOPSY Left 2013   stereo    BREAST BIOPSY Right 2011   stereo    CARDIAC VALVE REPLACEMENT     CHOLECYSTECTOMY N/A 01/30/2017   Procedure: LAPAROSCOPIC CHOLECYSTECTOMY;  Surgeon: Vanderbilt Ned, MD;  Location: [facility] OR;  Service: General;  Laterality: N/A;   COLONOSCOPY W/ POLYPECTOMY     COLONOSCOPY WITH PROPOFOL  N/A 05/16/2021   Procedure: COLONOSCOPY WITH PROPOFOL ;  Surgeon: Dianna Specking, MD;  Location: [facility] ENDOSCOPY;  Service: Endoscopy;  Laterality: N/A;   DG AV DIALYSIS GRAFT DECLOT OR     DILATATION & CURETTAGE/HYSTEROSCOPY WITH TRUECLEAR N/A 11/06/2012   Procedure: DILATATION & CURETTAGE/HYSTEROSCOPY WITH TRUECLEAR;  Surgeon: Curlee VEAR Guan, MD;  Location: [facility] ORS;  Service: Gynecology;  Laterality: N/A;  Truclear Resectoscopic Polypectomy    INSERTION OF DIALYSIS CATHETER N/A 02/19/2016   Procedure: INSERTION OF Left Internal Jugular DIALYSIS CATHETER;  Surgeon: Lonni GORMAN Blade, MD;  Location: [facility] OR;  Service: Vascular;  Laterality: N/A;   LOOP RECORDER INSERTION N/A 10/01/2016   Procedure: LOOP RECORDER INSERTION;   Surgeon: Inocencio Soyla Lunger, MD;  Location: [facility];  Service: Cardiovascular;  Laterality: N/A;   PATCH ANGIOPLASTY Right 02/17/2016   Procedure: PATCH ANGIOPLASTY;  Surgeon: Lonni GORMAN Blade, MD;  Location: [facility] OR;  Service: Vascular;  Laterality: Right;   PERIPHERAL VASCULAR CATHETERIZATION N/A 09/14/2014   Procedure: A/V Shuntogram/Fistulagram;  Surgeon: Cordella KANDICE Shawl, MD;  Location: [facility] INVASIVE CV LAB;  Service: Cardiovascular;  Laterality: N/A;   PERIPHERAL VASCULAR CATHETERIZATION N/A 09/14/2014   Procedure: A/V Shunt Intervention;  Surgeon: Cordella KANDICE Shawl, MD;  Location: [facility] INVASIVE CV LAB;  Service: Cardiovascular;  Laterality: N/A;   PERIPHERAL VASCULAR CATHETERIZATION Right 12/09/2014   Procedure: A/V Shuntogram/Fistulagram;  Surgeon: Selinda GORMAN Gu, MD;  Location: [facility] INVASIVE CV LAB;  Service: Cardiovascular;  Laterality: Right;   PERIPHERAL VASCULAR CATHETERIZATION N/A 12/09/2014   Procedure: A/V Shunt Intervention;  Surgeon: Selinda GORMAN Gu, MD;  Location: [facility] INVASIVE CV LAB;  Service: Cardiovascular;  Laterality: N/A;   PERIPHERAL VASCULAR CATHETERIZATION Right 05/24/2015   Procedure: A/V Shuntogram;  Surgeon: Gaile ORN Serene, MD;  Location: [facility]  INVASIVE CV LAB;  Service: Cardiovascular;  Laterality: Right;   PERIPHERAL VASCULAR CATHETERIZATION Right 05/24/2015   Procedure: Peripheral Vascular Balloon Angioplasty;  Surgeon: Gaile LELON New, MD;  Location: [facility] LAB;  Service: Cardiovascular;  Laterality: Right;  right arm shunt   PERIPHERAL VASCULAR CATHETERIZATION N/A 06/13/2015   Procedure: A/V Shuntogram/Fistulagram;  Surgeon: Selinda GORMAN Gu, MD;  Location: [facility] INVASIVE CV LAB;  Service: Cardiovascular;  Laterality: N/A;   PERIPHERAL VASCULAR CATHETERIZATION N/A 06/13/2015   Procedure: A/V Shunt Intervention;  Surgeon: Selinda GORMAN Gu, MD;  Location: [facility] INVASIVE CV LAB;  Service: Cardiovascular;  Laterality: N/A;   TEE WITHOUT CARDIOVERSION N/A 02/22/2016    Procedure: TRANSESOPHAGEAL ECHOCARDIOGRAM (TEE);  Surgeon: Vinie JAYSON Maxcy, MD;  Location: [facility] ENDOSCOPY;  Service: Cardiovascular;  Laterality: N/A;   TEE WITHOUT CARDIOVERSION N/A 02/25/2016   Procedure: TRANSESOPHAGEAL ECHOCARDIOGRAM (TEE);  Surgeon: Elspeth JAYSON Millers, MD;  Location: [facility] OR;  Service: Open Heart Surgery;  Laterality: N/A;   TEE WITHOUT CARDIOVERSION N/A 10/01/2016   Procedure: TRANSESOPHAGEAL ECHOCARDIOGRAM (TEE);  Surgeon: Maxcy Vinie JAYSON, MD;  Location: [facility] ENDOSCOPY;  Service: Cardiovascular;  Laterality: N/A;   TUBAL LIGATION  1983    OB History     Gravida  2   Para  2   Term  2   Preterm      AB      Living  2      SAB      IAB      Ectopic      Multiple      Live Births               Home Medications    Prior to Admission medications   Medication Sig Start Date End Date Taking? Authorizing Provider  predniSONE  (DELTASONE ) 20 MG tablet Take 2 tablets (40 mg total) by mouth daily with breakfast for 5 days. 09/12/23 09/17/23 Yes Vonna Sharlet POUR, MD  cinacalcet  (SENSIPAR ) 90 MG tablet Take 90 mg by mouth at bedtime. Patient not taking: Reported on 09/04/2023    [provider]  clopidogrel  (PLAVIX ) 75 MG tablet TAKE 1 TABLET BY MOUTH EVERY DAY 07/10/23   Conte, Tessa N, PA-C  famotidine  (PEPCID ) 20 MG tablet Take 20 mg by mouth daily after supper. Patient not taking: Reported on 09/04/2023    [provider]  fluticasone  (FLONASE ) 50 MCG/ACT nasal spray Place 2 sprays into both nostrils daily. Patient not taking: Reported on 09/04/2023 04/20/23   Teresa Almarie LABOR, PA-C  lidocaine  (LIDODERM ) 5 % Place 1 patch onto the skin daily for 10 days. Remove & Discard patch within 12 hours or as directed by MD 09/04/23 09/14/23  Petrina Pries, NP  Loratadine  5 MG TBDP Take 1 tablet (5 mg total) by mouth daily. Patient not taking: Reported on 09/04/2023 05/30/23   Petrina Pries, NP  midodrine  (PROAMATINE ) 10 MG tablet Take 10 mg by mouth See  admin instructions. Take 1 tablet (10 mg) by mouth before dialysis & take 1 tablet (10 mg) while at dialysis on Mondays, Wednesdays & Fridays. 08/15/20   [provider]  sevelamer  carbonate (RENVELA ) 800 MG tablet Take 2,400 mg by mouth See admin instructions. Take 3 tablets (2400 mg) by mouth after each meal and after each snack Patient not taking: Reported on 09/04/2023    [provider]  traMADol  (ULTRAM ) 50 MG tablet Take 1 tablet (50 mg total) by mouth every 12 (twelve) hours as needed. 09/04/23   Petrina Pries,  NP    Family History Family History  Problem Relation Age of Onset   Diabetes Mother    Hypertension Mother    Heart disease Mother    Alcohol abuse Mother    Kidney disease Mother    Cancer Father        COLON   Alcohol abuse Father    Breast cancer Sister 83   Hypertension Sister    Kidney disease Sister    Breast cancer Sister        breast cancer   Hypertension Son    Kidney disease Son     Social History Social History   Tobacco Use   Smoking status: Never   Smokeless tobacco: Never  Vaping Use   Vaping status: Never Used  Substance Use Topics   Alcohol use: No   Drug use: No     Allergies   Iodinated contrast media, Cefazolin , Diphenhydramine , and Naproxen  sodium   Review of Systems Review of Systems   Physical Exam Triage Vital Signs ED Triage Vitals  Encounter Vitals Group     BP 09/12/23 1152 (!) 98/49     Girls Systolic BP Percentile --      Girls Diastolic BP Percentile --      Boys Systolic BP Percentile --      Boys Diastolic BP Percentile --      Pulse --      Resp 09/12/23 1152 16     Temp --      Temp src --      SpO2 09/12/23 1152 100 %     Weight --      Height --      Head Circumference --      Peak Flow --      Pain Score 09/12/23 1151 7     Pain Loc --      Pain Education --      Exclude from Growth Chart --    No data found.  Updated Vital Signs BP (!) 98/49 (BP Location: Right Arm)   Resp 16    SpO2 100%   Visual Acuity Right Eye Distance:   Left Eye Distance:   Bilateral Distance:    Right Eye Near:   Left Eye Near:    Bilateral Near:     Physical Exam Vitals reviewed.  Constitutional:      General: She is not in acute distress.    Appearance: She is not ill-appearing, toxic-appearing or diaphoretic.  HENT:     Mouth/Throat:     Mouth: Mucous membranes are moist.     Pharynx: No oropharyngeal exudate or posterior oropharyngeal erythema.  Eyes:     Extraocular Movements: Extraocular movements intact.     Conjunctiva/sclera: Conjunctivae normal.     Pupils: Pupils are equal, round, and reactive to light.  Cardiovascular:     Rate and Rhythm: Normal rate and regular rhythm.     Heart sounds: No murmur heard. Pulmonary:     Effort: No respiratory distress.     Breath sounds: No stridor. No wheezing, rhonchi or rales.  Chest:     Chest wall: No tenderness.  Musculoskeletal:     Cervical back: Neck supple.     Comments: Graft is evident on her right arm.  There is no overt edema of the joints of her hands or wrists.  There is normal range of motion around the right shoulder.  Lymphadenopathy:     Cervical: No cervical adenopathy.  Skin:  Capillary Refill: Capillary refill takes less than 2 seconds.     Coloration: Skin is not jaundiced or pale.  Neurological:     General: No focal deficit present.     Mental Status: She is alert and oriented to person, place, and time.  Psychiatric:        Behavior: Behavior normal.      UC Treatments / Results  Labs (all labs ordered are listed, but only abnormal results are displayed) Labs Reviewed  CBC  BASIC METABOLIC PANEL WITH GFR    EKG   Radiology No results found.  Procedures Procedures (including critical care time)  Medications Ordered in UC Medications - No data to display  Initial Impression / Assessment and Plan / UC Course  I have reviewed the triage vital signs and the nursing  notes.  Pertinent labs & imaging results that were available during my care of the patient were reviewed by me and considered in my medical decision making (see chart for details).     CBC and BMP are drawn today to assess her fatigue symptoms.  5 days of prednisone  are sent in to help with the inflammation in her hands. Final Clinical Impressions(s) / UC Diagnoses   Final diagnoses:  Arthralgia, unspecified joint  Fatigue, unspecified type     Discharge Instructions      Take prednisone  20 mg--2 daily for 5 days  We have drawn blood to check your blood counts and electrolytes.  If there is a significant abnormality our staff will call you.  Please follow-up with your primary care    ED Prescriptions     Medication Sig Dispense Auth. Provider   predniSONE  (DELTASONE ) 20 MG tablet Take 2 tablets (40 mg total) by mouth daily with breakfast for 5 days. 10 tablet Vonna Ronel Rodeheaver K, MD      PDMP not reviewed this encounter.   Vonna Sharlet POUR, MD 09/12/23 [phone]

## 2023-09-12 NOTE — Discharge Instructions (Signed)
 Take prednisone  20 mg--2 daily for 5 days  We have drawn blood to check your blood counts and electrolytes.  If there is a significant abnormality our staff will call you.  Please follow-up with your primary care

## 2023-09-13 ENCOUNTER — Ambulatory Visit (HOSPITAL_COMMUNITY): Payer: Self-pay

## 2023-09-13 DIAGNOSIS — D509 Iron deficiency anemia, unspecified: Secondary | ICD-10-CM | POA: Diagnosis not present

## 2023-09-13 DIAGNOSIS — Z992 Dependence on renal dialysis: Secondary | ICD-10-CM | POA: Diagnosis not present

## 2023-09-13 DIAGNOSIS — N186 End stage renal disease: Secondary | ICD-10-CM | POA: Diagnosis not present

## 2023-09-13 DIAGNOSIS — N2581 Secondary hyperparathyroidism of renal origin: Secondary | ICD-10-CM | POA: Diagnosis not present

## 2023-09-13 DIAGNOSIS — D631 Anemia in chronic kidney disease: Secondary | ICD-10-CM | POA: Diagnosis not present

## 2023-09-16 DIAGNOSIS — N186 End stage renal disease: Secondary | ICD-10-CM | POA: Diagnosis not present

## 2023-09-16 DIAGNOSIS — D631 Anemia in chronic kidney disease: Secondary | ICD-10-CM | POA: Diagnosis not present

## 2023-09-16 DIAGNOSIS — Z992 Dependence on renal dialysis: Secondary | ICD-10-CM | POA: Diagnosis not present

## 2023-09-16 DIAGNOSIS — N2581 Secondary hyperparathyroidism of renal origin: Secondary | ICD-10-CM | POA: Diagnosis not present

## 2023-09-16 DIAGNOSIS — D509 Iron deficiency anemia, unspecified: Secondary | ICD-10-CM | POA: Diagnosis not present

## 2023-09-18 DIAGNOSIS — D509 Iron deficiency anemia, unspecified: Secondary | ICD-10-CM | POA: Diagnosis not present

## 2023-09-18 DIAGNOSIS — D631 Anemia in chronic kidney disease: Secondary | ICD-10-CM | POA: Diagnosis not present

## 2023-09-18 DIAGNOSIS — N2581 Secondary hyperparathyroidism of renal origin: Secondary | ICD-10-CM | POA: Diagnosis not present

## 2023-09-18 DIAGNOSIS — Z992 Dependence on renal dialysis: Secondary | ICD-10-CM | POA: Diagnosis not present

## 2023-09-18 DIAGNOSIS — N186 End stage renal disease: Secondary | ICD-10-CM | POA: Diagnosis not present

## 2023-09-20 DIAGNOSIS — N2581 Secondary hyperparathyroidism of renal origin: Secondary | ICD-10-CM | POA: Diagnosis not present

## 2023-09-20 DIAGNOSIS — Z992 Dependence on renal dialysis: Secondary | ICD-10-CM | POA: Diagnosis not present

## 2023-09-20 DIAGNOSIS — D509 Iron deficiency anemia, unspecified: Secondary | ICD-10-CM | POA: Diagnosis not present

## 2023-09-20 DIAGNOSIS — N186 End stage renal disease: Secondary | ICD-10-CM | POA: Diagnosis not present

## 2023-09-20 DIAGNOSIS — D631 Anemia in chronic kidney disease: Secondary | ICD-10-CM | POA: Diagnosis not present

## 2023-09-23 DIAGNOSIS — N2581 Secondary hyperparathyroidism of renal origin: Secondary | ICD-10-CM | POA: Diagnosis not present

## 2023-09-23 DIAGNOSIS — N186 End stage renal disease: Secondary | ICD-10-CM | POA: Diagnosis not present

## 2023-09-23 DIAGNOSIS — Z992 Dependence on renal dialysis: Secondary | ICD-10-CM | POA: Diagnosis not present

## 2023-09-23 DIAGNOSIS — D509 Iron deficiency anemia, unspecified: Secondary | ICD-10-CM | POA: Diagnosis not present

## 2023-09-23 DIAGNOSIS — D631 Anemia in chronic kidney disease: Secondary | ICD-10-CM | POA: Diagnosis not present

## 2023-09-25 DIAGNOSIS — D631 Anemia in chronic kidney disease: Secondary | ICD-10-CM | POA: Diagnosis not present

## 2023-09-25 DIAGNOSIS — D509 Iron deficiency anemia, unspecified: Secondary | ICD-10-CM | POA: Diagnosis not present

## 2023-09-25 DIAGNOSIS — N2581 Secondary hyperparathyroidism of renal origin: Secondary | ICD-10-CM | POA: Diagnosis not present

## 2023-09-25 DIAGNOSIS — Z992 Dependence on renal dialysis: Secondary | ICD-10-CM | POA: Diagnosis not present

## 2023-09-25 DIAGNOSIS — N186 End stage renal disease: Secondary | ICD-10-CM | POA: Diagnosis not present

## 2023-09-26 ENCOUNTER — Other Ambulatory Visit: Payer: Self-pay

## 2023-09-26 ENCOUNTER — Ambulatory Visit (INDEPENDENT_AMBULATORY_CARE_PROVIDER_SITE_OTHER)

## 2023-09-26 DIAGNOSIS — M546 Pain in thoracic spine: Secondary | ICD-10-CM | POA: Diagnosis not present

## 2023-09-26 DIAGNOSIS — R2689 Other abnormalities of gait and mobility: Secondary | ICD-10-CM

## 2023-09-26 NOTE — Therapy (Signed)
 OUTPATIENT PHYSICAL THERAPY LOWER EXTREMITY EVALUATION   Patient Name: Gracey A [place] MRN: [id number] DOB:[birth year], 68 y.o., female Today's Date: 09/26/2023  END OF SESSION:  PT End of Session - 09/26/23 1438     Visit Number 1    Number of Visits 17    Date for PT Re-Evaluation 11/21/23    Authorization Type Medicare [employer] senior    PT Start Time 1300    PT Stop Time 1338    PT Time Calculation (min) 38 min    Equipment Utilized During Treatment Gait belt    Activity Tolerance Patient limited by fatigue    Behavior During Therapy WFL for tasks assessed/performed          Past Medical History:  Diagnosis Date   Anemia    Aortic valve prosthesis present 02/25/2016   Arthritis    knees (01/03/2017)   AVD (aortic valve disease) 07/12/2016   Backache 12/06/2008   Bacteremia due to coagulase-negative Staphylococcus    Carpal tunnel syndrome    Cerebral embolism with transient ischemic attack (TIA)    Cholelithiases 01/28/2017   Chronic female pelvic pain 08/08/2012   Complication of anesthesia    01/01/17- 'a long time ago, difficulty breathimg, not sure if it was due to anesthesia or not.   CVA (cerebral vascular accident) (HCC) 05/16/2016   Dialysis patient (HCC)    End stage renal disease on dialysis (HCC)    MWF; [address] (01/03/2017)   Endocarditis    Esophageal reflux 12/06/2008   GERD (gastroesophageal reflux disease)    Gout    History of blood transfusion 2017   related to blood poison (01/03/2017)   Increased endometrial stripe thickness 11/03/2015   Neck pain    Osteoporosis 09/2016   T score -2.6   Pain and swelling of right upper extremity 12/20/2015   Pulmonary HTN (HCC) 07/12/2016   Renal dialysis device, implant, or graft complication 12/20/2015   Renal insufficiency    S/P cholecystectomy 02/14/2017   Septic shock (HCC)    Staphylococcus aureus bacteremia    TIA (transient ischemic attack)    several (01/03/2017)   Past  Surgical History:  Procedure Laterality Date   AORTIC VALVE REPLACEMENT N/A 02/25/2016   Procedure: AORTIC VALVE REPLACEMENT (AVR) implanted with Magna Ease Aortic valve size 21mm;  Surgeon: Elspeth JAYSON Millers, MD;  Location: [facility] OR;  Service: Open Heart Surgery;  Laterality: N/A;   AV FISTULA PLACEMENT Left 01/03/2017   Procedure: INSERTION OF ARTERIOVENOUS (AV) GORE-TEX GRAFT LEFT THIGH;  Surgeon: Serene Gaile ORN, MD;  Location: [facility] OR;  Service: Vascular;  Laterality: Left;   AVGG REMOVAL Right 02/17/2016   Procedure: REMOVAL OF TWO ARTERIOVENOUS GORETEX GRAFTS (AVGG);  Surgeon: Lonni GORMAN Blade, MD;  Location: [facility] OR;  Service: Vascular;  Laterality: Right;   BIOPSY  05/16/2021   Procedure: BIOPSY;  Surgeon: Dianna Specking, MD;  Location: [facility] ENDOSCOPY;  Service: Endoscopy;;   BREAST BIOPSY Left 2013   stereo    BREAST BIOPSY Right 2011   stereo    CARDIAC VALVE REPLACEMENT     CHOLECYSTECTOMY N/A 01/30/2017   Procedure: LAPAROSCOPIC CHOLECYSTECTOMY;  Surgeon: Vanderbilt Ned, MD;  Location: [facility] OR;  Service: General;  Laterality: N/A;   COLONOSCOPY W/ POLYPECTOMY     COLONOSCOPY WITH PROPOFOL  N/A 05/16/2021   Procedure: COLONOSCOPY WITH PROPOFOL ;  Surgeon: Dianna Specking, MD;  Location: [facility];  Service: Endoscopy;  Laterality: N/A;   DG AV DIALYSIS GRAFT DECLOT OR  DILATATION & CURETTAGE/HYSTEROSCOPY WITH TRUECLEAR N/A 11/06/2012   Procedure: DILATATION & CURETTAGE/HYSTEROSCOPY WITH TRUECLEAR;  Surgeon: Curlee VEAR Guan, MD;  Location: [facility] ORS;  Service: Gynecology;  Laterality: N/A;  Truclear Resectoscopic Polypectomy    INSERTION OF DIALYSIS CATHETER N/A 02/19/2016   Procedure: INSERTION OF Left Internal Jugular DIALYSIS CATHETER;  Surgeon: Lonni GORMAN Blade, MD;  Location: [facility] OR;  Service: Vascular;  Laterality: N/A;   LOOP RECORDER INSERTION N/A 10/01/2016   Procedure: LOOP RECORDER INSERTION;  Surgeon: Inocencio Soyla Lunger, MD;  Location: [facility];   Service: Cardiovascular;  Laterality: N/A;   PATCH ANGIOPLASTY Right 02/17/2016   Procedure: PATCH ANGIOPLASTY;  Surgeon: Lonni GORMAN Blade, MD;  Location: [facility] OR;  Service: Vascular;  Laterality: Right;   PERIPHERAL VASCULAR CATHETERIZATION N/A 09/14/2014   Procedure: A/V Shuntogram/Fistulagram;  Surgeon: Cordella KANDICE Shawl, MD;  Location: [facility] INVASIVE CV LAB;  Service: Cardiovascular;  Laterality: N/A;   PERIPHERAL VASCULAR CATHETERIZATION N/A 09/14/2014   Procedure: A/V Shunt Intervention;  Surgeon: Cordella KANDICE Shawl, MD;  Location: [facility] INV[facility] CV LAB;  Service: Cardiovascular;  Laterality: N/A;   PERIPHERAL VASCULAR CATHETERIZATION Right 12/09/2014   Procedure: A/V Shuntogram/Fistulagram;  Surgeon: Selinda GORMAN Gu, MD;  Location: [facility] INVASIVE CV LAB;  Service: Cardiovascular;  Laterality: Right;   PERIPHERAL VASCULAR CATHETERIZATION N/A 12/09/2014   Procedure: A/V Shunt Intervention;  Surgeon: Selinda GORMAN Gu, MD;  Location: [facility] INV[facility] CV LAB;  Service: Cardiovascular;  Laterality: N/A;   PERIPHERAL VASCULAR CATHETERIZATION Right 05/24/2015   Procedure: A/V Shuntogram;  Surgeon: Gaile LELON New, MD;  Location: [facility];  Service: Cardiovascular;  Laterality: Right;   PERIPHERAL VASCULAR CATHETERIZATION Right 05/24/2015   Procedure: Peripheral Vascular Balloon Angioplasty;  Surgeon: Gaile LELON New, MD;  Location: [facility];  Service: Cardiovascular;  Laterality: Right;  right arm shunt   PERIPHERAL VASCULAR CATHETERIZATION N/A 06/13/2015   Procedure: A/V Shuntogram/Fistulagram;  Surgeon: Selinda GORMAN Gu, MD;  Location: [facility] INV[facility];  Service: Cardiovascular;  Laterality: N/A;   PERIPHERAL VASCULAR CATHETERIZATION N/A 06/13/2015   Procedure: A/V Shunt Intervention;  Surgeon: Selinda GORMAN Gu, MD;  Location: [facility] INVASIVE CV LAB;  Service: Cardiovascular;  Laterality: N/A;   TEE WITHOUT CARDIOVERSION N/A 02/22/2016   Procedure: TRANSESOPHAGEAL ECHOCARDIOGRAM (TEE);  Surgeon:  Vinie JAYSON Maxcy, MD;  Location: [facility] ENDOSCOPY;  Service: Cardiovascular;  Laterality: N/A;   TEE WITHOUT CARDIOVERSION N/A 02/25/2016   Procedure: TRANSESOPHAGEAL ECHOCARDIOGRAM (TEE);  Surgeon: Elspeth JAYSON Millers, MD;  Location: [facility] OR;  Service: Open Heart Surgery;  Laterality: N/A;   TEE WITHOUT CARDIOVERSION N/A 10/01/2016   Procedure: TRANSESOPHAGEAL ECHOCARDIOGRAM (TEE);  Surgeon: Maxcy Vinie JAYSON, MD;  Location: [facility] ENDOSCOPY;  Service: Cardiovascular;  Laterality: N/A;   TUBAL LIGATION  1983   Patient Active Problem List   Diagnosis Date Noted   Gait difficulty 09/09/2023   Lumbar pain 09/09/2023   Presence of heart assist device (HCC) 09/04/2023   Seasonal allergic rhinitis due to pollen 05/30/2023   Sensorineural hearing loss (SNHL) of both ears 05/30/2023   S/P aortic valve replacement 05/30/2023   History of colonic polyps 05/16/2021   Endocarditis 07/30/2020   Hyperparathyroidism due to renal insufficiency (HCC) 07/30/2020   Tooth, broken 12/18/2018   Hypotension 10/03/2018   Diarrhea, unspecified 08/25/2018   Orthostatic hypotension 03/14/2018   S/P cholecystectomy 02/14/2017   Osteoporosis 12/11/2016   Cholelithiasis 11/07/2016   Cerebral embolism with transient ischemic attack (TIA)    Bilateral carpal tunnel syndrome 08/09/2016  AVD (aortic valve disease) 07/12/2016   Pulmonary HTN (HCC) 07/12/2016   Carpal tunnel syndrome 07/05/2016   Disorder of both mastoids 05/29/2016   CVA (cerebral vascular accident) (HCC) 05/16/2016   History of aortic valve replacement with bioprosthetic valve 05/08/2016   Bacteremia due to coagulase-negative Staphylococcus    History of endocarditis    Anemia    Complex endometrial hyperplasia with atypia 11/08/2015   Dependence on renal dialysis (HCC) 12/15/2014   Obesity 10/28/2014   Right carotid bruit 01/08/2013   Generalized headaches 01/08/2013   Chronic female pelvic pain 08/08/2012   Gout, unspecified 12/06/2008   Esophageal  reflux 12/06/2008   Backache 12/06/2008   FIBROIDS, UTERUS 11/24/2008   Diverticulosis of large intestine without perforation or abscess without bleeding 03/22/2008   ESRD (end stage renal disease) on dialysis (HCC) 07/01/2006   Anemia in chronic kidney disease 12/18/2001    PCP: Petrina Pries, NP   REFERRING PROVIDER: Petrina Pries, NP   REFERRING DIAG: R26.9 (ICD-10-CM) - Gait difficulty M54.50 (ICD-10-CM) - Lumbar pain  THERAPY DIAG:  Other abnormalities of gait and mobility  Pain in thoracic spine  Rationale for Evaluation and Treatment: Rehabilitation  ONSET DATE: early July  SUBJECTIVE:   SUBJECTIVE STATEMENT: Pt tripped and fell early July onto her back.  Pt states initially her low back hurt  but now c/o upper back/shoulder pain and difficulty walking. Ambulates with SPC occasionally for the last few years.  She states she has started using it more because she feels weak.  PERTINENT HISTORY: On dialysis,  ESRD, heart assistive device PAIN:  Are you having pain? Yes: NPRS scale: 5/10 Pain location: upper back, bilateral shoulders Pain description: dull Aggravating factors: motion, ambulation, transfers Relieving factors: nothing  PRECAUTIONS: See hx --Pt has port for dialysis at anterior L thigh  RED FLAGS: None   WEIGHT BEARING RESTRICTIONS: No  FALLS:  Has patient fallen in last 6 months? Yes. Number of falls 1  LIVING ENVIRONMENT: Lives with: lives with their family Lives in: House/apartment Stairs: No Has following equipment at home: Single point cane  OCCUPATION: not working  PLOF: Independent  PATIENT GOALS: Be steady with walking and less pain in upper back/shoulders  NEXT MD VISIT: 01/05/24 per chart  OBJECTIVE:  Note: Objective measures were completed at Evaluation unless otherwise noted.  DIAGNOSTIC FINDINGS: R Knee x rays In April 2025  FINDINGS: Chondrocalcinosis identified. Small osteophytes seen of all 3 compartments.  Subchondral cyst formation along the medial compartment along the tibial plateau. No fracture or dislocation. Osteopenia. No joint effusion lateral view. Vascular calcifications.   IMPRESSION: Tracking metal degenerative changes.  Chondrocalcinosis.  PATIENT SURVEYS:  PSFS: THE PATIENT SPECIFIC FUNCTIONAL SCALE  Place score of 0-10 (0 = unable to perform activity and 10 = able to perform activity at the same level as before injury or problem)  Activity Date: 09/26/23    Lifting 5 lbs 5    2.Carrying 5 lbs 3    3.Walking without cane 5    4. Making the bed 4    Total Score 4.25      Total Score = Sum of activity scores/number of activities  Minimally Detectable Change: 3 points (for single activity); 2 points (for average score)  [facility] (nd). The Patient Specific Functional Scale . Retrieved from [email or web address]   COGNITION: Overall cognitive status: Within functional limits for tasks assessed     SENSATION: Not tested  EDEMA: Not addressed  MUSCLE LENGTH: Hamstrings: Moderate to  sever restriction hamstrings bilaterally   POSTURE: rounded shoulders and forward head  SHOULDER AROM:  Flexion L 70 degrees R 60 degrees, Abduction L 70 degrees R 90 degrees  SPINAL ROM : Restricted 50% or more throughout  LOWER EXTREMITY MMT:  MMT Right eval Left eval  Hip flexion 4- 4-  Hip extension 4- 4-  Hip abduction 4- 4-  Hip adduction 4 4  Hip internal rotation    Hip external rotation    Knee flexion    Knee extension 4 3+  Ankle dorsiflexion    Ankle plantarflexion    Ankle inversion    Ankle eversion     (Blank rows = not tested)  LOWER EXTREMITY SPECIAL TESTS:  No speciality tests for orthopedic issues completed.  FUNCTIONAL TESTS:  5 times sit to stand: 36 sec  GAIT: Distance walked: 40 feet with CGA with no AD during eval Assistive device utilized: Single point cane Level of  assistance: Modified independence Comments: Gait pattern: Decreased heel strike , decreased push off, knees flexed in stride, shoulders rounded, increased sway                                                                                                                                 TREATMENT DATE:  09/26/23 Evaluation and instruction and 1 set run through of HEP.   PATIENT EDUCATION:  Education details: Pt given written paper. Person educated: Patient Education method: Explanation, Demonstration, Tactile cues, and Verbal cues Education comprehension: verbalized understanding and returned demonstration  HOME EXERCISE PROGRAM: Access Code: [email or web address] URL: [email or web address] Date: 09/26/2023 Prepared by: Burnard Meth  Exercises - Supine Bridge  - 2 x daily - 7 x weekly - 2 sets - 8 reps - Supine Hip Adduction Isometric with Ball  - 2 x daily - 7 x weekly - 2 sets - 8 reps - 3 hold - Supine Knee Extension Strengthening  - 2 x daily - 7 x weekly - 2 sets - 8 reps - 2 hold - Seated Shoulder Shrugs  - 2 x daily - 7 x weekly - 2 sets - 10 reps - Seated Scapular Retraction  - 2 x daily - 7 x weekly - 2 sets - 10 reps  ASSESSMENT:  CLINICAL IMPRESSION: Patient is a 68 y.o. female who was seen today for physical therapy evaluation and treatment for back pain and gait abnormalities. She presents with decreased spinal motion, dec shoulder ROM, dec LE/UE strength, altered gait, dec endurance for ADLs/ambulation and pain.  She would benefit from skilled PT to address these deficits and improve LOF at home and with community activities.  OBJECTIVE IMPAIRMENTS: Abnormal gait, decreased activity tolerance, decreased balance, decreased coordination, decreased ROM, decreased strength, impaired flexibility, and pain.   ACTIVITY LIMITATIONS: carrying, lifting, standing, squatting, stairs, and transfers  PARTICIPATION LIMITATIONS: driving and community activity  PERSONAL FACTORS: 3+  comorbidities also affecting patient's functional outcome.   REHAB POTENTIAL: Fair [place]  personal factors  CLINICAL DECISION MAKING: Stable/uncomplicated  EVALUATION COMPLEXITY: Moderate   GOALS: Goals reviewed with patient? Yes  SHORT TERM GOALS: Target date: 11/07/2023   Pt to be independent with HEP. Baseline: Goal status: INITIAL  2.  Decrease pain by 1 level. Baseline:5/10  Goal status: INITIAL  3.  Pt able to ambulate 100 feet with SPC and SBAx1. Baseline: 40-50 feet with assist Goal status: INITIAL  LONG TERM GOALS: Target date: 12/19/2023    Increase LE strength  to 4+ throughout LE to increase strength for ambulation. Baseline: 4- Goal status: INITIAL  2.  Increase shoulder AROM to at least 90 degrees flexion and abduction. Baseline: 70+ Goal status: INITIAL  3.  Pt able to ambulate 200 ft with SPC if needed to increase community ambulation. Baseline: 40 ft Goal status: INITIAL  4.  Improve sit to stand score by 10+ seconds. Baseline:  Goal status: INITIAL  5.  Improve PSFS by 2 points to 6.25. Baseline: 4.25 Goal status: INITIAL  6.  Decrease max pain to 3/10 in upper back with shoulder activity. Baseline: 5/10 Goal status: INITIAL   PLAN:  PT FREQUENCY: 1-2x/week  PT DURATION: 12 weeks  PLANNED INTERVENTIONS: [id number]- PT Re-evaluation, 97110-Therapeutic exercises, 97530- Therapeutic activity, [id number]- Neuromuscular re-education, 97535- Self Care, [id number]- Manual therapy, [id number]- Gait training, [phone]- Aquatic Therapy, [phone]- Vasopneumatic device, Patient/Family education, Balance training, Stair training, Joint mobilization, Cryotherapy, and Moist heat  PLAN FOR NEXT SESSION: Initiate warm up, add LE strengthening and some balance exercises.   Burnard CHRISTELLA Meth, PT 09/26/2023, 2:40 PM

## 2023-09-27 DIAGNOSIS — N039 Chronic nephritic syndrome with unspecified morphologic changes: Secondary | ICD-10-CM | POA: Diagnosis not present

## 2023-09-27 DIAGNOSIS — D509 Iron deficiency anemia, unspecified: Secondary | ICD-10-CM | POA: Diagnosis not present

## 2023-09-27 DIAGNOSIS — N186 End stage renal disease: Secondary | ICD-10-CM | POA: Diagnosis not present

## 2023-09-27 DIAGNOSIS — Z992 Dependence on renal dialysis: Secondary | ICD-10-CM | POA: Diagnosis not present

## 2023-09-27 DIAGNOSIS — N2581 Secondary hyperparathyroidism of renal origin: Secondary | ICD-10-CM | POA: Diagnosis not present

## 2023-09-27 DIAGNOSIS — D631 Anemia in chronic kidney disease: Secondary | ICD-10-CM | POA: Diagnosis not present

## 2023-09-30 DIAGNOSIS — Z992 Dependence on renal dialysis: Secondary | ICD-10-CM | POA: Diagnosis not present

## 2023-09-30 DIAGNOSIS — D631 Anemia in chronic kidney disease: Secondary | ICD-10-CM | POA: Diagnosis not present

## 2023-09-30 DIAGNOSIS — D509 Iron deficiency anemia, unspecified: Secondary | ICD-10-CM | POA: Diagnosis not present

## 2023-09-30 DIAGNOSIS — N186 End stage renal disease: Secondary | ICD-10-CM | POA: Diagnosis not present

## 2023-09-30 DIAGNOSIS — N2581 Secondary hyperparathyroidism of renal origin: Secondary | ICD-10-CM | POA: Diagnosis not present

## 2023-10-02 DIAGNOSIS — Z992 Dependence on renal dialysis: Secondary | ICD-10-CM | POA: Diagnosis not present

## 2023-10-02 DIAGNOSIS — D631 Anemia in chronic kidney disease: Secondary | ICD-10-CM | POA: Diagnosis not present

## 2023-10-02 DIAGNOSIS — N2581 Secondary hyperparathyroidism of renal origin: Secondary | ICD-10-CM | POA: Diagnosis not present

## 2023-10-02 DIAGNOSIS — D509 Iron deficiency anemia, unspecified: Secondary | ICD-10-CM | POA: Diagnosis not present

## 2023-10-02 DIAGNOSIS — N186 End stage renal disease: Secondary | ICD-10-CM | POA: Diagnosis not present

## 2023-10-04 DIAGNOSIS — N186 End stage renal disease: Secondary | ICD-10-CM | POA: Diagnosis not present

## 2023-10-04 DIAGNOSIS — N2581 Secondary hyperparathyroidism of renal origin: Secondary | ICD-10-CM | POA: Diagnosis not present

## 2023-10-04 DIAGNOSIS — Z992 Dependence on renal dialysis: Secondary | ICD-10-CM | POA: Diagnosis not present

## 2023-10-04 DIAGNOSIS — D631 Anemia in chronic kidney disease: Secondary | ICD-10-CM | POA: Diagnosis not present

## 2023-10-04 DIAGNOSIS — D509 Iron deficiency anemia, unspecified: Secondary | ICD-10-CM | POA: Diagnosis not present

## 2023-10-07 DIAGNOSIS — N186 End stage renal disease: Secondary | ICD-10-CM | POA: Diagnosis not present

## 2023-10-07 DIAGNOSIS — Z992 Dependence on renal dialysis: Secondary | ICD-10-CM | POA: Diagnosis not present

## 2023-10-07 DIAGNOSIS — D509 Iron deficiency anemia, unspecified: Secondary | ICD-10-CM | POA: Diagnosis not present

## 2023-10-07 DIAGNOSIS — N2581 Secondary hyperparathyroidism of renal origin: Secondary | ICD-10-CM | POA: Diagnosis not present

## 2023-10-07 DIAGNOSIS — D631 Anemia in chronic kidney disease: Secondary | ICD-10-CM | POA: Diagnosis not present

## 2023-10-07 NOTE — Therapy (Signed)
 OUTPATIENT PHYSICAL THERAPY LOWER EXTREMITY EVALUATION   Patient Name: [name] Marks MRN: [id number] DOB:Oct 12, 1955, 68 y.o., female Today's Date: 10/08/2023  END OF SESSION:  PT End of Session - 10/08/23 1107     Visit Number 2    Number of Visits 17    Date for PT Re-Evaluation 11/21/23    Authorization Type Medicare [employer] senior    PT Start Time 1024    PT Stop Time 1102    PT Time Calculation (min) 38 min    Activity Tolerance Patient tolerated treatment well    Behavior During Therapy WFL for tasks assessed/performed           Past Medical History:  Diagnosis Date   Anemia    Aortic valve prosthesis present 02/25/2016   Arthritis    knees (01/03/2017)   AVD (aortic valve disease) 07/12/2016   Backache 12/06/2008   Bacteremia due to coagulase-negative Staphylococcus    Carpal tunnel syndrome    Cerebral embolism with transient ischemic attack (TIA)    Cholelithiases 01/28/2017   Chronic female pelvic pain 08/08/2012   Complication of anesthesia    01/01/17- 'a long time ago, difficulty breathimg, not sure if it was due to anesthesia or not.   CVA (cerebral vascular accident) (HCC) 05/16/2016   Dialysis patient (HCC)    End stage renal disease on dialysis (HCC)    MWF; [address] (01/03/2017)   Endocarditis    Esophageal reflux 12/06/2008   GERD (gastroesophageal reflux disease)    Gout    History of blood transfusion 2017   related to blood poison (01/03/2017)   Increased endometrial stripe thickness 11/03/2015   Neck pain    Osteoporosis 09/2016   T score -2.6   Pain and swelling of right upper extremity 12/20/2015   Pulmonary HTN (HCC) 07/12/2016   Renal dialysis device, implant, or graft complication 12/20/2015   Renal insufficiency    S/P cholecystectomy 02/14/2017   Septic shock (HCC)    Staphylococcus aureus bacteremia    TIA (transient ischemic attack)    several (01/03/2017)   Past Surgical History:  Procedure Laterality Date    AORTIC VALVE REPLACEMENT N/A 02/25/2016   Procedure: AORTIC VALVE REPLACEMENT (AVR) implanted with Magna Ease Aortic valve size 21mm;  Surgeon: Elspeth JAYSON Millers, MD;  Location: [facility] OR;  Service: Open Heart Surgery;  Laterality: N/A;   AV FISTULA PLACEMENT Left 01/03/2017   Procedure: INSERTION OF ARTERIOVENOUS (AV) GORE-TEX GRAFT LEFT THIGH;  Surgeon: Serene Gaile ORN, MD;  Location: [facility] OR;  Service: Vascular;  Laterality: Left;   AVGG REMOVAL Right 02/17/2016   Procedure: REMOVAL OF TWO ARTERIOVENOUS GORETEX GRAFTS (AVGG);  Surgeon: Lonni GORMAN Blade, MD;  Location: [facility] OR;  Service: Vascular;  Laterality: Right;   BIOPSY  05/16/2021   Procedure: BIOPSY;  Surgeon: Dianna Specking, MD;  Location: [facility] ENDOSCOPY;  Service: Endoscopy;;   BREAST BIOPSY Left 2013   stereo    BREAST BIOPSY Right 2011   stereo    CARDIAC VALVE REPLACEMENT     CHOLECYSTECTOMY N/A 01/30/2017   Procedure: LAPAROSCOPIC CHOLECYSTECTOMY;  Surgeon: Vanderbilt Ned, MD;  Location: [facility] OR;  Service: General;  Laterality: N/A;   COLONOSCOPY W/ POLYPECTOMY     COLONOSCOPY WITH PROPOFOL  N/A 05/16/2021   Procedure: COLONOSCOPY WITH PROPOFOL ;  Surgeon: Dianna Specking, MD;  Location: [facility] ENDOSCOPY;  Service: Endoscopy;  Laterality: N/A;   DG AV DIALYSIS GRAFT DECLOT OR     DILATATION & CURETTAGE/HYSTEROSCOPY WITH TRUECLEAR N/A 11/06/2012  Procedure: DILATATION & CURETTAGE/HYSTEROSCOPY WITH TRUECLEAR;  Surgeon: Curlee VEAR Guan, MD;  Location: [facility] ORS;  Service: Gynecology;  Laterality: N/A;  Truclear Resectoscopic Polypectomy    INSERTION OF DIALYSIS CATHETER N/A 02/19/2016   Procedure: INSERTION OF Left Internal Jugular DIALYSIS CATHETER;  Surgeon: Lonni GORMAN Blade, MD;  Location: [facility] OR;  Service: Vascular;  Laterality: N/A;   LOOP RECORDER INSERTION N/A 10/01/2016   Procedure: LOOP RECORDER INSERTION;  Surgeon: Inocencio Soyla Lunger, MD;  Location: [facility] INVASIVE CV LAB;  Service: Cardiovascular;  Laterality: N/A;   PATCH  ANGIOPLASTY Right 02/17/2016   Procedure: PATCH ANGIOPLASTY;  Surgeon: Lonni GORMAN Blade, MD;  Location: [facility] OR;  Service: Vascular;  Laterality: Right;   PERIPHERAL VASCULAR CATHETERIZATION N/A 09/14/2014   Procedure: A/V Shuntogram/Fistulagram;  Surgeon: Cordella KANDICE Shawl, MD;  Location: [facility] INVASIVE CV LAB;  Service: Cardiovascular;  Laterality: N/A;   PERIPHERAL VASCULAR CATHETERIZATION N/A 09/14/2014   Procedure: A/V Shunt Intervention;  Surgeon: Cordella KANDICE Shawl, MD;  Location: [facility] INVASIVE CV LAB;  Service: Cardiovascular;  Laterality: N/A;   PERIPHERAL VASCULAR CATHETERIZATION Right 12/09/2014   Procedure: A/V Shuntogram/Fistulagram;  Surgeon: Selinda GORMAN Gu, MD;  Location: [facility] INVASIVE CV LAB;  Service: Cardiovascular;  Laterality: Right;   PERIPHERAL VASCULAR CATHETERIZATION N/A 12/09/2014   Procedure: A/V Shunt Intervention;  Surgeon: Selinda GORMAN Gu, MD;  Location: [facility] INVASIVE CV LAB;  Service: Cardiovascular;  Laterality: N/A;   PERIPHERAL VASCULAR CATHETERIZATION Right 05/24/2015   Procedure: A/V Shuntogram;  Surgeon: Gaile LELON New, MD;  Location: [facility] INVASIVE CV LAB;  Service: Cardiovascular;  Laterality: Right;   PERIPHERAL VASCULAR CATHETERIZATION Right 05/24/2015   Procedure: Peripheral Vascular Balloon Angioplasty;  Surgeon: Gaile LELON New, MD;  Location: [facility] INVASIVE CV LAB;  Service: Cardiovascular;  Laterality: Right;  right arm shunt   PERIPHERAL VASCULAR CATHETERIZATION N/A 06/13/2015   Procedure: A/V Shuntogram/Fistulagram;  Surgeon: Selinda GORMAN Gu, MD;  Location: [facility] INVASIVE CV LAB;  Service: Cardiovascular;  Laterality: N/A;   PERIPHERAL VASCULAR CATHETERIZATION N/A 06/13/2015   Procedure: A/V Shunt Intervention;  Surgeon: Selinda GORMAN Gu, MD;  Location: [facility] INVASIVE CV LAB;  Service: Cardiovascular;  Laterality: N/A;   TEE WITHOUT CARDIOVERSION N/A 02/22/2016   Procedure: TRANSESOPHAGEAL ECHOCARDIOGRAM (TEE);  Surgeon: Vinie JAYSON Maxcy, MD;  Location: [facility] ENDOSCOPY;  Service:  Cardiovascular;  Laterality: N/A;   TEE WITHOUT CARDIOVERSION N/A 02/25/2016   Procedure: TRANSESOPHAGEAL ECHOCARDIOGRAM (TEE);  Surgeon: Elspeth JAYSON Millers, MD;  Location: [facility] OR;  Service: Open Heart Surgery;  Laterality: N/A;   TEE WITHOUT CARDIOVERSION N/A 10/01/2016   Procedure: TRANSESOPHAGEAL ECHOCARDIOGRAM (TEE);  Surgeon: Maxcy Vinie JAYSON, MD;  Location: [facility] ENDOSCOPY;  Service: Cardiovascular;  Laterality: N/A;   TUBAL LIGATION  1983   Patient Active Problem List   Diagnosis Date Noted   Gait difficulty 09/09/2023   Lumbar pain 09/09/2023   Presence of heart assist device (HCC) 09/04/2023   Seasonal allergic rhinitis due to pollen 05/30/2023   Sensorineural hearing loss (SNHL) of both ears 05/30/2023   S/P aortic valve replacement 05/30/2023   History of colonic polyps 05/16/2021   Endocarditis 07/30/2020   Hyperparathyroidism due to renal insufficiency (HCC) 07/30/2020   Tooth, broken 12/18/2018   Hypotension 10/03/2018   Diarrhea, unspecified 08/25/2018   Orthostatic hypotension 03/14/2018   S/P cholecystectomy 02/14/2017   Osteoporosis 12/11/2016   Cholelithiasis 11/07/2016   Cerebral embolism with transient ischemic attack (TIA)    Bilateral carpal tunnel syndrome 08/09/2016   AVD (aortic valve disease) 07/12/2016   Pulmonary  HTN (HCC) 07/12/2016   Carpal tunnel syndrome 07/05/2016   Disorder of both mastoids 05/29/2016   CVA (cerebral vascular accident) (HCC) 05/16/2016   History of aortic valve replacement with bioprosthetic valve 05/08/2016   Bacteremia due to coagulase-negative Staphylococcus    History of endocarditis    Anemia    Complex endometrial hyperplasia with atypia 11/08/2015   Dependence on renal dialysis (HCC) 12/15/2014   Obesity 10/28/2014   Right carotid bruit 01/08/2013   Generalized headaches 01/08/2013   Chronic female pelvic pain 08/08/2012   Gout, unspecified 12/06/2008   Esophageal reflux 12/06/2008   Backache 12/06/2008   FIBROIDS,  UTERUS 11/24/2008   Diverticulosis of large intestine without perforation or abscess without bleeding 03/22/2008   ESRD (end stage renal disease) on dialysis (HCC) 07/01/2006   Anemia in chronic kidney disease 12/18/2001    PCP: Petrina Pries, NP   REFERRING PROVIDER: Petrina Pries, NP   REFERRING DIAG: R26.9 (ICD-10-CM) - Gait difficulty M54.50 (ICD-10-CM) - Lumbar pain  THERAPY DIAG:  Other abnormalities of gait and mobility  Pain in thoracic spine  Rationale for Evaluation and Treatment: Rehabilitation  ONSET DATE: early July  SUBJECTIVE:   SUBJECTIVE STATEMENT: Pt states last week she had increased back pain which affected her sleeping.  Today is better.  PERTINENT HISTORY: On dialysis,  ESRD, heart assistive device PAIN:  Are you having pain? Yes: NPRS scale: 0/10 Pain location: upper back, bilateral shoulders Pain description: dull Aggravating factors: motion, ambulation, transfers Relieving factors: nothing  PRECAUTIONS: See hx --Pt has port for dialysis at anterior L thigh  RED FLAGS: None   WEIGHT BEARING RESTRICTIONS: No  FALLS:  Has patient fallen in last 6 months? Yes. Number of falls 1  LIVING ENVIRONMENT: Lives with: lives with their family Lives in: House/apartment Stairs: No Has following equipment at home: Single point cane  OCCUPATION: not working  PLOF: Independent  PATIENT GOALS: Be steady with walking and less pain in upper back/shoulders  NEXT MD VISIT: 01/05/24 per chart  OBJECTIVE:  Note: Objective measures were completed at Evaluation unless otherwise noted.  DIAGNOSTIC FINDINGS: R Knee x rays In April 2025  FINDINGS: Chondrocalcinosis identified. Small osteophytes seen of all 3 compartments. Subchondral cyst formation along the medial compartment along the tibial plateau. No fracture or dislocation. Osteopenia. No joint effusion lateral view. Vascular calcifications.   IMPRESSION: Tracking metal degenerative changes.   Chondrocalcinosis.  PATIENT SURVEYS:  PSFS: THE PATIENT SPECIFIC FUNCTIONAL SCALE  Place score of 0-10 (0 = unable to perform activity and 10 = able to perform activity at the same level as before injury or problem)  Activity Date: 09/26/23    Lifting 5 lbs 5    2.Carrying 5 lbs 3    3.Walking without cane 5    4. Making the bed 4    Total Score 4.25      Total Score = Sum of activity scores/number of activities  Minimally Detectable Change: 3 points (for single activity); 2 points (for average score)  [facility] (nd). The Patient Specific Functional Scale . Retrieved from [email or web address]   COGNITION: Overall cognitive status: Within functional limits for tasks assessed     SENSATION: Not tested  EDEMA: Not addressed  MUSCLE LENGTH: Hamstrings: Moderate to sever restriction hamstrings bilaterally   POSTURE: rounded shoulders and forward head  SHOULDER AROM:  Flexion L 70 degrees R 60 degrees, Abduction L 70 degrees R 90 degrees  SPINAL ROM : Restricted 50% or more throughout  LOWER EXTREMITY MMT:  MMT Right eval Left eval  Hip flexion 4- 4-  Hip extension 4- 4-  Hip abduction 4- 4-  Hip adduction 4 4  Hip internal rotation    Hip external rotation    Knee flexion    Knee extension 4 3+  Ankle dorsiflexion    Ankle plantarflexion    Ankle inversion    Ankle eversion     (Blank rows = not tested)  LOWER EXTREMITY SPECIAL TESTS:  No speciality tests for orthopedic issues completed.  FUNCTIONAL TESTS:  5 times sit to stand: 36 sec  GAIT: Distance walked: 40 feet with CGA with no AD during eval Assistive device utilized: Single point cane Level of assistance: Modified independence Comments: Gait pattern: Decreased heel strike , decreased push off, knees flexed in stride, shoulders rounded, increased sway                                                                                                                                  TREATMENT DATE:  10/08/23 Ther Ex Bridges 2x10 SAQ 2x10 Hip adduction ball squeezes 10x10 sec Supine shoulder flexion with dowel 10x Neuro Re ed Seated toe raises 2x10 Seated marching 2x10 Mini squats holding onto counter 2x10 Side steps at counter 2x10 There Act Pulleys 3 min Wall ladder 2x each hand      09/26/23 Evaluation and instruction and 1 set run through of HEP.   PATIENT EDUCATION:  Education details: Pt given written paper. Person educated: Patient Education method: Explanation, Demonstration, Tactile cues, and Verbal cues Education comprehension: verbalized understanding and returned demonstration  HOME EXERCISE PROGRAM: Access Code: [email or web address] URL: [email or web address] Date: 09/26/2023 Prepared by: Burnard Meth  Exercises - Supine Bridge  - 2 x daily - 7 x weekly - 2 sets - 8 reps - Supine Hip Adduction Isometric with Ball  - 2 x daily - 7 x weekly - 2 sets - 8 reps - 3 hold - Supine Knee Extension Strengthening  - 2 x daily - 7 x weekly - 2 sets - 8 reps - 2 hold - Seated Shoulder Shrugs  - 2 x daily - 7 x weekly - 2 sets - 10 reps - Seated Scapular Retraction  - 2 x daily - 7 x weekly - 2 sets - 10 reps  ASSESSMENT:  CLINICAL IMPRESSION: Pt able to complete program but needed frequent rest breaks.  Eval-Patient is a 68 y.o. female who was seen today for physical therapy evaluation and treatment for back pain and gait abnormalities. She presents with decreased spinal motion, dec shoulder ROM, dec LE/UE strength, altered gait, dec endurance for ADLs/ambulation and pain.  She would benefit from skilled PT to address these deficits and improve LOF at home and with community activities.  OBJECTIVE IMPAIRMENTS: Abnormal gait, decreased activity tolerance, decreased balance, decreased coordination, decreased ROM, decreased strength, impaired flexibility, and pain.   ACTIVITY LIMITATIONS: carrying,  lifting, standing, squatting,  stairs, and transfers  PARTICIPATION LIMITATIONS: driving and community activity  PERSONAL FACTORS: 3+ comorbidities also affecting patient's functional outcome.   REHAB POTENTIAL: Fair See personal factors  CLINICAL DECISION MAKING: Stable/uncomplicated  EVALUATION COMPLEXITY: Moderate   GOALS: Goals reviewed with patient? Yes  SHORT TERM GOALS: Target date: 11/07/2023   Pt to be independent with HEP. Baseline: Goal status: INITIAL  2.  Decrease pain by 1 level. Baseline:5/10  Goal status: INITIAL  3.  Pt able to ambulate 100 feet with SPC and SBAx1. Baseline: 40-50 feet with assist Goal status: INITIAL  LONG TERM GOALS: Target date: 12/19/2023    Increase LE strength  to 4+ throughout LE to increase strength for ambulation. Baseline: 4- Goal status: INITIAL  2.  Increase shoulder AROM to at least 90 degrees flexion and abduction. Baseline: 70+ Goal status: INITIAL  3.  Pt able to ambulate 200 ft with SPC if needed to increase community ambulation. Baseline: 40 ft Goal status: INITIAL  4.  Improve sit to stand score by 10+ seconds. Baseline:  Goal status: INITIAL  5.  Improve PSFS by 2 points to 6.25. Baseline: 4.25 Goal status: INITIAL  6.  Decrease max pain to 3/10 in upper back with shoulder activity. Baseline: 5/10 Goal status: INITIAL   PLAN:  PT FREQUENCY: 1-2x/week  PT DURATION: 12 weeks  PLANNED INTERVENTIONS: [id number]- PT Re-evaluation, 97110-Therapeutic exercises, 97530- Therapeutic activity, [id number]- Neuromuscular re-education, 97535- Self Care, [id number]- Manual therapy, [id number]- Gait training, [phone]- Aquatic Therapy, [phone]- Vasopneumatic device, Patient/Family education, Balance training, Stair training, Joint mobilization, Cryotherapy, and Moist heat  PLAN FOR NEXT SESSION: Increase LE strengthening, shoulder and balance exercises as tolerated.   Burnard CHRISTELLA Meth, PT 10/08/2023, 11:10 AM

## 2023-10-08 ENCOUNTER — Ambulatory Visit

## 2023-10-08 DIAGNOSIS — M546 Pain in thoracic spine: Secondary | ICD-10-CM

## 2023-10-08 DIAGNOSIS — R2689 Other abnormalities of gait and mobility: Secondary | ICD-10-CM

## 2023-10-09 ENCOUNTER — Emergency Department (HOSPITAL_COMMUNITY)
Admission: EM | Admit: 2023-10-09 | Discharge: 2023-10-09 | Disposition: A | Discharge status: Home / Self Care | Attending: Emergency Medicine | Admitting: Emergency Medicine

## 2023-10-09 ENCOUNTER — Encounter (HOSPITAL_COMMUNITY): Payer: Self-pay

## 2023-10-09 DIAGNOSIS — Z7902 Long term (current) use of antithrombotics/antiplatelets: Secondary | ICD-10-CM | POA: Insufficient documentation

## 2023-10-09 DIAGNOSIS — Z992 Dependence on renal dialysis: Secondary | ICD-10-CM | POA: Diagnosis not present

## 2023-10-09 DIAGNOSIS — G8929 Other chronic pain: Secondary | ICD-10-CM | POA: Diagnosis not present

## 2023-10-09 DIAGNOSIS — R7989 Other specified abnormal findings of blood chemistry: Secondary | ICD-10-CM | POA: Diagnosis not present

## 2023-10-09 DIAGNOSIS — M545 Low back pain, unspecified: Secondary | ICD-10-CM | POA: Diagnosis not present

## 2023-10-09 DIAGNOSIS — M549 Dorsalgia, unspecified: Secondary | ICD-10-CM | POA: Diagnosis present

## 2023-10-09 DIAGNOSIS — N186 End stage renal disease: Secondary | ICD-10-CM | POA: Diagnosis not present

## 2023-10-09 LAB — BASIC METABOLIC PANEL WITH GFR
Anion gap: 11 (ref 5–15)
BUN: 13 mg/dL (ref 8–23)
CO2: 24 mmol/L (ref 22–32)
Calcium: 9.5 mg/dL (ref 8.9–10.3)
Chloride: 98 mmol/L (ref 98–111)
Creatinine, Ser: 5.93 mg/dL — ABNORMAL HIGH (ref 0.44–1.00)
GFR, Estimated: 7 mL/min — ABNORMAL LOW (ref >60)
Glucose, Bld: 89 mg/dL (ref 70–99)
Potassium: 3.8 mmol/L (ref 3.5–5.1)
Sodium: 133 mmol/L — ABNORMAL LOW (ref 135–145)

## 2023-10-09 LAB — CBC WITH DIFFERENTIAL/PLATELET
Abs Immature Granulocytes: 0.01 K/uL (ref 0.00–0.07)
Basophils Absolute: 0 K/uL (ref 0.0–0.1)
Basophils Relative: 0 %
Eosinophils Absolute: 0.1 K/uL (ref 0.0–0.5)
Eosinophils Relative: 3 %
HCT: 30.8 % — ABNORMAL LOW (ref 36.0–46.0)
Hemoglobin: 9.7 g/dL — ABNORMAL LOW (ref 12.0–15.0)
Immature Granulocytes: 0 %
Lymphocytes Relative: 27 %
Lymphs Abs: 1 K/uL (ref 0.7–4.0)
MCH: 29.8 pg (ref 26.0–34.0)
MCHC: 31.5 g/dL (ref 30.0–36.0)
MCV: 94.8 fL (ref 80.0–100.0)
Monocytes Absolute: 0.4 K/uL (ref 0.1–1.0)
Monocytes Relative: 10 %
Neutro Abs: 2.2 K/uL (ref 1.7–7.7)
Neutrophils Relative %: 60 %
Platelets: 129 K/uL — ABNORMAL LOW (ref 150–400)
RBC: 3.25 MIL/uL — ABNORMAL LOW (ref 3.87–5.11)
RDW: 16.3 % — ABNORMAL HIGH (ref 11.5–15.5)
WBC: 3.8 K/uL — ABNORMAL LOW (ref 4.0–10.5)
nRBC: 0 % (ref 0.0–0.2)

## 2023-10-09 MED ORDER — TRAMADOL HCL 50 MG PO TABS: 50.0000 mg | ORAL_TABLET | Freq: Four times a day (QID) | ORAL | 0 refills | Status: DC | PRN

## 2023-10-09 MED ORDER — OXYCODONE-ACETAMINOPHEN 5-325 MG PO TABS
2.0000 | ORAL_TABLET | Freq: Once | ORAL | Status: AC
  Administered 2023-10-09 (×2): 2 via ORAL
  Filled 2023-10-09: qty 2

## 2023-10-09 NOTE — ED Provider Notes (Signed)
 [name] Marks EMERGENCY DEPARTMENT AT [facility] Provider Note   CSN: [id number] Arrival date & time: 10/09/23  9365     Patient presents with: Back Pain   [name] Marks is a 68 y.o. female who presents to the ED today with concerns of increased back pain.  This has been chronic however she states that she recently ran out of her tramadol  and has increased pain over the last 24 hours that has prevented her from going to her scheduled dialysis appointment today.  She has a previous medical diagnosis of chronic kidney disease with end-stage renal disease requiring dialysis.  Denies having any recent falls or injuries, however does state that she has noted over the last several weeks increased weakness in the bilateral lower extremities.  This did lead to her having a fall at home 2 weeks ago, however the pain she is experiencing today did not begin until this morning.  Denies having any saddle anesthesia, denies any bowel or bladder incontinence.    Back Pain      Prior to Admission medications   Medication Sig Start Date End Date Taking? Authorizing Provider  traMADol  (ULTRAM ) 50 MG tablet Take 1 tablet (50 mg total) by mouth every 6 (six) hours as needed. 10/09/23  Yes Myriam Dorn BROCKS, PA  cinacalcet  (SENSIPAR ) 90 MG tablet Take 90 mg by mouth at bedtime. Patient not taking: Reported on 09/04/2023    [provider]  clopidogrel  (PLAVIX ) 75 MG tablet TAKE 1 TABLET BY MOUTH EVERY DAY 07/10/23   Conte, Tessa N, PA-C  famotidine  (PEPCID ) 20 MG tablet Take 20 mg by mouth daily after supper. Patient not taking: Reported on 09/04/2023    [provider]  fluticasone  (FLONASE ) 50 MCG/ACT nasal spray Place 2 sprays into both nostrils daily. Patient not taking: Reported on 09/04/2023 04/20/23   Teresa Almarie LABOR, PA-C  Loratadine  5 MG TBDP Take 1 tablet (5 mg total) by mouth daily. Patient not taking: Reported on 09/04/2023 05/30/23   Petrina Pries, NP  midodrine   (PROAMATINE ) 10 MG tablet Take 10 mg by mouth See admin instructions. Take 1 tablet (10 mg) by mouth before dialysis & take 1 tablet (10 mg) while at dialysis on Mondays, Wednesdays & Fridays. 08/15/20   [provider]  sevelamer  carbonate (RENVELA ) 800 MG tablet Take 2,400 mg by mouth See admin instructions. Take 3 tablets (2400 mg) by mouth after each meal and after each snack Patient not taking: Reported on 09/04/2023    [provider]  traMADol  (ULTRAM ) 50 MG tablet Take 1 tablet (50 mg total) by mouth every 12 (twelve) hours as needed. 09/04/23   Petrina Pries, NP    Allergies: Iodinated contrast media, Cefazolin , Diphenhydramine , and Naproxen  sodium    Review of Systems  Musculoskeletal:  Positive for back pain.  All other systems reviewed and are negative.   Updated Vital Signs BP (!) 125/58 (BP Location: Left Arm)   Pulse (!) 57   Temp 97.7 F (36.5 C)   Resp 18   SpO2 100%   Physical Exam Vitals and nursing note reviewed.  Constitutional:      General: She is not in acute distress.    Appearance: Normal appearance.  HENT:     Head: Normocephalic and atraumatic.     Mouth/Throat:     Mouth: Mucous membranes are moist.     Pharynx: Oropharynx is clear.  Eyes:     Extraocular Movements: Extraocular movements intact.  Conjunctiva/sclera: Conjunctivae normal.     Pupils: Pupils are equal, round, and reactive to light.  Cardiovascular:     Rate and Rhythm: Normal rate and regular rhythm.     Pulses: Normal pulses.     Heart sounds: Normal heart sounds. No murmur heard.    No friction rub. No gallop.  Pulmonary:     Effort: Pulmonary effort is normal.     Breath sounds: Normal breath sounds.  Abdominal:     General: Abdomen is flat. Bowel sounds are normal.     Palpations: Abdomen is soft.  Musculoskeletal:        General: Normal range of motion.     Cervical back: Normal, normal range of motion and neck supple.     Thoracic back: Normal.      Lumbar back: Tenderness present.     Right lower leg: No edema.     Left lower leg: No edema.     Comments: Tenderness along the length of the lumbar spine without any acute deformities or edema noted.  Pain is greater along the paraspinal muscles than midline.  Skin:    General: Skin is warm and dry.     Capillary Refill: Capillary refill takes less than 2 seconds.  Neurological:     General: No focal deficit present.     Mental Status: She is alert. Mental status is at baseline.  Psychiatric:        Mood and Affect: Mood normal.     (all labs ordered are listed, but only abnormal results are displayed) Labs Reviewed  BASIC METABOLIC PANEL WITH GFR - Abnormal; Notable for the following components:      Result Value   Sodium 133 (*)    Creatinine, Ser 5.93 (*)    GFR, Estimated 7 (*)    All other components within normal limits  CBC WITH DIFFERENTIAL/PLATELET - Abnormal; Notable for the following components:   WBC 3.8 (*)    RBC 3.25 (*)    Hemoglobin 9.7 (*)    HCT 30.8 (*)    RDW 16.3 (*)    Platelets 129 (*)    All other components within normal limits  URINALYSIS, ROUTINE W REFLEX MICROSCOPIC    EKG: None  Radiology: No results found.   Procedures   Medications Ordered in the ED  oxyCODONE -acetaminophen  (PERCOCET/ROXICET) 5-325 MG per tablet 2 tablet (2 tablets Oral Given 10/09/23 0758)    Clinical Course as of 10/09/23 1011  Wed Oct 09, 2023  0911 Reassessed patient, she has dramatically improved levels of pain, and is resting comfortably upon reexam. [JG]    Clinical Course User Index [JG] Myriam Dorn BROCKS, PA                                 Medical Decision Making Amount and/or Complexity of Data Reviewed Labs: ordered.  Risk Prescription drug management.   Medical Decision Making:   [name] Marks is a 68 y.o. female who presented to the ED today with back pain detailed above.    External chart has been reviewed including prior labs and  imaging. Patient's presentation is complicated by their history of ESRD.  Patient placed on continuous vitals and telemetry monitoring while in ED which was reviewed periodically.  Complete initial physical exam performed, notably the patient  was alert and oriented in no apparent distress.  Physical exam was notable for bilateral proximal muscle weakness,  no focal sensory deficits.    Reviewed and confirmed nursing documentation for past medical history, family history, social history.    Initial Assessment:   With the patient's presentation of back pain, most likely diagnosis is chronic back pain.  Degenerative disc disease is also a differential diagnosis as well as new onset UTI.   Initial Plan:   Screening labs including CBC and Metabolic panel to evaluate for infectious or metabolic etiology of disease.  Urinalysis with reflex culture ordered to evaluate for UTI or relevant urologic/nephrologic pathology.  Provide initial analgesia with oral oxycodone . Objective evaluation as below reviewed   Initial Study Results:   Laboratory  All laboratory results reviewed without evidence of clinically relevant pathology.   Exceptions include: CBC does show a hemoglobin of 9.7 with a white count of 3.8, this is stable compared to baseline from several weeks ago.  Creatinine is elevated at 5.93 with GFR 7 however again this is stable compared to baseline.  Unable to obtain urine as patient has been anuric for several years.   Reassessment and Plan:   Given her pain has subjectively improved with oxycodone , and given her history of chronic back pain.  Will continue her present previously prescribed tramadol  and discharge patient states she can follow-up with her regularly scheduled dialysis.  Encouraged her to follow-up with primary care regarding advanced imaging of the lower back, given her weakness developing in the legs may very well need MRI to exclude any developing neuropathy.        Final  diagnoses:  Chronic bilateral low back pain without sciatica    ED Discharge Orders          Ordered    traMADol  (ULTRAM ) 50 MG tablet  Every 6 hours PRN        10/09/23 1010               Myriam Dorn BROCKS, [place] 10/09/23 1011    Randol Simmonds, MD 10/10/23 1420

## 2023-10-09 NOTE — ED Triage Notes (Signed)
 Pt states that she has been having back pain for the past 2 years, denies injury. Denies urinary symptoms, pt is a dialysis pt. States that she has taken tramadol  for the pain but it does not work.

## 2023-10-09 NOTE — Discharge Instructions (Signed)
 Please go to your scheduled dialysis, and follow-up here in the emergency room should your back pain continue to worsen or you develop any new or concerning symptoms.  Specifically develop any issues with being able to control your bowels or your bladder, please seek emergent care here in the ED.

## 2023-10-10 ENCOUNTER — Ambulatory Visit (HOSPITAL_COMMUNITY)
Admission: RE | Admit: 2023-10-10 | Discharge: 2023-10-10 | Disposition: A | Discharge status: Home / Self Care | Attending: Vascular Surgery | Admitting: Vascular Surgery

## 2023-10-10 ENCOUNTER — Other Ambulatory Visit: Payer: Self-pay

## 2023-10-10 ENCOUNTER — Encounter (HOSPITAL_COMMUNITY)
Admission: RE | Disposition: A | Discharge status: Home / Self Care | Payer: Self-pay | Source: Home / Self Care | Attending: Vascular Surgery

## 2023-10-10 DIAGNOSIS — Z992 Dependence on renal dialysis: Secondary | ICD-10-CM | POA: Insufficient documentation

## 2023-10-10 DIAGNOSIS — Y832 Surgical operation with anastomosis, bypass or graft as the cause of abnormal reaction of the patient, or of later complication, without mention of misadventure at the time of the procedure: Secondary | ICD-10-CM | POA: Diagnosis not present

## 2023-10-10 DIAGNOSIS — N186 End stage renal disease: Secondary | ICD-10-CM | POA: Diagnosis not present

## 2023-10-10 DIAGNOSIS — T82858A Stenosis of vascular prosthetic devices, implants and grafts, initial encounter: Secondary | ICD-10-CM | POA: Insufficient documentation

## 2023-10-10 HISTORY — PX: A/V SHUNT INTERVENTION: CATH118220

## 2023-10-10 HISTORY — PX: VENOUS ANGIOPLASTY: CATH118376

## 2023-10-10 SURGERY — A/V SHUNT INTERVENTION
Anesthesia: LOCAL | Site: Thigh | Laterality: Left

## 2023-10-10 MED ORDER — HEPARIN (PORCINE) IN NACL 1000-0.9 UT/500ML-% IV SOLN
INTRAVENOUS | Status: DC | PRN
  Administered 2023-10-10: 500 mL

## 2023-10-10 MED ORDER — LIDOCAINE HCL (PF) 1 % IJ SOLN
INTRAMUSCULAR | Status: DC | PRN
  Administered 2023-10-10: 5 mL via SUBCUTANEOUS

## 2023-10-10 MED ORDER — METHYLPREDNISOLONE SODIUM SUCC 125 MG IJ SOLR
INTRAMUSCULAR | Status: DC | PRN
  Administered 2023-10-10: 125 mg via INTRAVENOUS

## 2023-10-10 MED ORDER — IODIXANOL 320 MG/ML IV SOLN
INTRAVENOUS | Status: DC | PRN
  Administered 2023-10-10: 30 mL via INTRAVENOUS

## 2023-10-10 MED ORDER — METHYLPREDNISOLONE SODIUM SUCC 125 MG IJ SOLR
INTRAMUSCULAR | Status: AC
Start: 2023-10-10 — End: 2023-10-10
  Filled 2023-10-10: qty 2

## 2023-10-10 MED ORDER — LIDOCAINE HCL (PF) 1 % IJ SOLN
INTRAMUSCULAR | Status: AC
  Filled 2023-10-10: qty 30

## 2023-10-10 SURGICAL SUPPLY — 9 items
BALLOON MUSTANG 7.0X40 75 (BALLOONS) IMPLANT
GUIDEWIRE ANGLED .035X150CM (WIRE) IMPLANT
KIT ENCORE 26 ADVANTAGE (KITS) IMPLANT
KIT MICROPUNCTURE NIT STIFF (SHEATH) IMPLANT
SHEATH PINNACLE R/O II 6F 4CM (SHEATH) IMPLANT
SHEATH PROBE COVER 6X72 (BAG) IMPLANT
STOPCOCK MORSE 400PSI 3WAY (MISCELLANEOUS) IMPLANT
TRAY PV CATH (CUSTOM PROCEDURE TRAY) ×2 IMPLANT
TUBING CIL FLEX 10 FLL-RA (TUBING) IMPLANT

## 2023-10-10 NOTE — Op Note (Addendum)
    Patient name: [name] Marks MRN: [id number] DOB: 09-29-1955 Sex: female  10/10/2023 Pre-operative Diagnosis: End-stage renal disease, malfunction left thigh AV graft Post-operative diagnosis:  Same Surgeon:  Penne BROCKS. Sheree, MD Procedure Performed: 1.  Percutaneous ultrasound-guided cannulation left thigh AV graft 2.  Left thigh shuntogram 3.  Balloon venoplasty left graft to greater saphenous vein anastomosis using 7 mm balloon  Indications: 67 year old female with end-stage renal disease on dialysis via left thigh AV graft which was placed in 2018.  She has undergone percutaneous intervention in the past now has increased bleeding times at completion of dialysis and is indicated for shuntogram with possible invention.  Findings: The graft itself was patent throughout its course to the arterial anastomosis the common femoral artery was widely patent and there was brisk inflow.  The outflow which is anastomosed to approximately 1 cm saphenous vein had approximately 75% stenosis and was ballooned to 0% residual stenosis with full effacement of the 7 mm balloon.  The central veins were patent including the femoral, common and external iliac veins as well as IVC.  Graft remains amenable to future percutaneous access and intervention.   Procedure:  The patient was identified in the holding area and taken to the heart and vascular procedure room.  The patient was then placed supine on the table and prepped and draped in the usual sterile fashion.  A time out was called.  Ultrasound was used to evaluate the left thigh AV graft.  An ultrasound image was saved to the permanent record and we used this to cannulate the graft with micropuncture needle followed by wire and sheath and 125 mg of Solu-Medrol  was administered due to contrast allergy and Benadryl  allergy.  Shuntogram was performed.  With the above findings we placed a Glidewire and a 6 [place] sheath.  We then placed a 7 mm balloon across the  stenosis and perform balloon venoplasty on 2 separate occasions to a total of 12 atm with full effacement.  Completion demonstrated no residual stenosis.  Satisfied with this complete the wire were removed and the cannulation site was suture-ligated.  She tolerated the procedure without any complication.  Contrast: 30cc    Venetta Knee C. Sheree, MD [facility] Office: [phone] Pager: [phone]

## 2023-10-10 NOTE — H&P (Signed)
 H+P  History of Present Illness: This is a 68 y.o. female has a history of left thigh AV graft placement in 2018.  This was performed and end to end fashion to the saphenous vein 1 cm beyond the saphenofemoral junction.  The graft has apparently worked very well but has had some increasing bleeding times at completion of dialysis and she is now here for graftogram with possible intervention.  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   CVA (cerebral vascular accident) (HCC) 05/16/2016   Dialysis patient (HCC)    End stage renal disease on dialysis (HCC)    MWF; [address] (01/03/2017)   Endocarditis    Esophageal reflux 12/06/2008   GERD (gastroesophageal reflux disease)    Gout    History of blood transfusion 2017   related to blood poison (01/03/2017)   Increased endometrial stripe thickness 11/03/2015   Neck pain    Osteoporosis 09/2016   T score -2.6   Pain and swelling of right upper extremity 12/20/2015   Pulmonary HTN (HCC) 07/12/2016   Renal dialysis device, implant, or graft complication 12/20/2015   Renal insufficiency    S/P cholecystectomy 02/14/2017   Septic shock (HCC)    Staphylococcus aureus bacteremia    TIA (transient ischemic attack)    several (01/03/2017)    Past Surgical History:  Procedure Laterality Date   AORTIC VALVE REPLACEMENT N/A 02/25/2016   Procedure: AORTIC VALVE REPLACEMENT (AVR) implanted with Magna Ease Aortic valve size 21mm;  Surgeon: Elspeth JAYSON Millers, MD;  Location: [facility] OR;  Service: Open Heart Surgery;  Laterality: N/A;   AV FISTULA PLACEMENT Left 01/03/2017   Procedure: INSERTION OF ARTERIOVENOUS (AV) GORE-TEX GRAFT LEFT THIGH;  Surgeon: Serene Gaile ORN, MD;  Location: [facility] OR;  Service: Vascular;  Laterality: Left;   AVGG REMOVAL Right 02/17/2016   Procedure: REMOVAL OF TWO ARTERIOVENOUS GORETEX GRAFTS (AVGG);  Surgeon: Lonni GORMAN Blade, MD;  Location: [facility] OR;  Service: Vascular;  Laterality: Right;   BIOPSY  05/16/2021   Procedure: BIOPSY;  Surgeon: Dianna Specking, MD;  Location: [facility] ENDOSCOPY;  Service: Endoscopy;;   BREAST BIOPSY Left 2013   stereo    BREAST BIOPSY Right 2011   stereo    CARDIAC VALVE REPLACEMENT     CHOLECYSTECTOMY N/A 01/30/2017   Procedure: LAPAROSCOPIC CHOLECYSTECTOMY;  Surgeon: Vanderbilt Ned, MD;  Location: [facility] OR;  Service: General;  Laterality: N/A;   COLONOSCOPY W/ POLYPECTOMY     COLONOSCOPY WITH PROPOFOL  N/A 05/16/2021   Procedure: COLONOSCOPY WITH PROPOFOL ;  Surgeon: Dianna Specking, MD;  Location: [facility] ENDOSCOPY;  Service: Endoscopy;  Laterality: N/A;   DG AV DIALYSIS GRAFT DECLOT OR     DILATATION & CURETTAGE/HYSTEROSCOPY WITH TRUECLEAR N/A 11/06/2012   Procedure: DILATATION & CURETTAGE/HYSTEROSCOPY WITH TRUECLEAR;  Surgeon: Curlee VEAR Guan, MD;  Location: [facility] ORS;  Service: Gynecology;  Laterality: N/A;  Truclear Resectoscopic Polypectomy    INSERTION OF DIALYSIS CATHETER N/A 02/19/2016   Procedure: INSERTION OF Left Internal  Jugular DIALYSIS CATHETER;  Surgeon: Lonni GORMAN Blade, MD;  Location: [facility] OR;  Service: Vascular;  Laterality: N/A;   LOOP RECORDER INSERTION N/A 10/01/2016   Procedure: LOOP RECORDER INSERTION;  Surgeon: Inocencio Soyla Lunger, MD;  Location: [facility] INVASIVE CV LAB;  Service: Cardiovascular;  Laterality: N/A;   PATCH ANGIOPLASTY Right 02/17/2016   Procedure: PATCH ANGIOPLASTY;  Surgeon: Lonni GORMAN Blade, MD;  Location: [facility] OR;  Service: Vascular;  Laterality: Right;    PERIPHERAL VASCULAR CATHETERIZATION N/A 09/14/2014   Procedure: A/V Shuntogram/Fistulagram;  Surgeon: Cordella KANDICE Shawl, MD;  Location: [facility] INVASIVE CV LAB;  Service: Cardiovascular;  Laterality: N/A;   PERIPHERAL VASCULAR CATHETERIZATION N/A 09/14/2014   Procedure: A/V Shunt Intervention;  Surgeon: Cordella KANDICE Shawl, MD;  Location: [facility] INVASIVE CV LAB;  Service: Cardiovascular;  Laterality: N/A;   PERIPHERAL VASCULAR CATHETERIZATION Right 12/09/2014   Procedure: A/V Shuntogram/Fistulagram;  Surgeon: Selinda GORMAN Gu, MD;  Location: [facility] INVASIVE CV LAB;  Service: Cardiovascular;  Laterality: Right;   PERIPHERAL VASCULAR CATHETERIZATION N/A 12/09/2014   Procedure: A/V Shunt Intervention;  Surgeon: Selinda GORMAN Gu, MD;  Location: [facility] INVASIVE CV LAB;  Service: Cardiovascular;  Laterality: N/A;   PERIPHERAL VASCULAR CATHETERIZATION Right 05/24/2015   Procedure: A/V Shuntogram;  Surgeon: Gaile LELON New, MD;  Location: [facility] INVAS[facility] CV LAB;  Service: Cardiovascular;  Laterality: Right;   PERIPHERAL VASCULAR CATHETERIZATION Right 05/24/2015   Procedure: Peripheral Vascular Balloon Angioplasty;  Surgeon: Gaile LELON New, MD;  Location: [facility] INVASIVE CV LAB;  Service: Cardiovascular;  Laterality: Right;  right arm shunt   PERIPHERAL VASCULAR CATHETERIZATION N/A 06/13/2015   Procedure: A/V Shuntogram/Fistulagram;  Surgeon: Selinda GORMAN Gu, MD;  Location: [facility] INVASIVE CV LAB;  Service: Cardiovascular;  Laterality: N/A;   PERIPHERAL VASCULAR CATHETERIZATION N/A 06/13/2015   Procedure: A/V Shunt Intervention;  Surgeon: Selinda GORMAN Gu, MD;  Location: [facility] INVASIVE CV LAB;  Service: Cardiovascular;  Laterality: N/A;   TEE WITHOUT CARDIOVERSION N/A 02/22/2016   Procedure: TRANSESOPHAGEAL ECHOCARDIOGRAM (TEE);  Surgeon: Vinie JAYSON Maxcy, MD;  Location[facility] ENDOSCOPY;  Service: Cardiovascular;  Laterality: N/A;   TEE WITHOUT CARDIOVERSION N/A 02/25/2016   Procedure: TRANSESOPHAGEAL ECHOCARDIOGRAM (TEE);  Surgeon: Elspeth JAYSON Millers, MD;  Location: [facility] OR;  Service: Open Heart Surgery;  Laterality: N/A;   TEE WITHOUT CARDIOVERSION N/A 10/01/2016   Procedure: TRANSESOPHAGEAL ECHOCARDIOGRAM (TEE);  Surgeon: Maxcy Vinie JAYSON, MD;  Location: [facility] ENDOSCOPY;  Service: Cardiovascular;  Laterality: N/A;   TUBAL LIGATION  1983    Allergies  Allergen Reactions   Iodinated Contrast Media Anaphylaxis and Hives   Cefazolin  Nausea And Vomiting   Diphenhydramine  Other (See Comments)   Naproxen  Sodium Itching    Prior to Admission medications   Medication Sig Start Date End Date Taking? Authorizing Provider  cinacalcet  (SENSIPAR ) 90 MG tablet Take 90 mg by mouth at bedtime. Patient not taking: Reported on 09/04/2023    [provider]  clopidogrel  (PLAVIX ) 75 MG tablet TAKE 1 TABLET BY MOUTH EVERY DAY 07/10/23   Conte, Tessa N, PA-C  famotidine  (PEPCID ) 20 MG tablet Take 20 mg by mouth daily after supper. Patient not taking: Reported on 09/04/2023    [provider]  fluticasone  (FLONASE ) 50 MCG/ACT nasal spray Place 2 sprays into both nostrils daily. Patient not taking: Reported on 09/04/2023 04/20/23   Teresa Almarie LABOR, PA-C  Loratadine  5 MG TBDP Take 1 tablet (5 mg total) by mouth daily. Patient not taking: Reported on 09/04/2023 05/30/23   Petrina Pries, NP  midodrine  (PROAMATINE )  10 MG tablet Take 10 mg by mouth See admin instructions. Take 1 tablet (10 mg) by mouth before dialysis & take 1 tablet (10 mg) while at dialysis on Mondays, Wednesdays & Fridays. 08/15/20   [provider]  sevelamer  carbonate (RENVELA ) 800 MG tablet Take 2,400 mg by mouth See admin instructions. Take 3 tablets (2400 mg) by mouth after each meal and after each snack Patient not taking: Reported on 09/04/2023    [provider]  traMADol  (ULTRAM ) 50 MG tablet Take 1 tablet (50 mg total) by mouth every 12 (twelve) hours as needed. 09/04/23   Petrina Pries, NP  traMADol  (ULTRAM ) 50 MG tablet Take 1 tablet (50 mg total) by  mouth every 6 (six) hours as needed. 10/09/23   Myriam Dorn BROCKS, PA    Social History   Socioeconomic History   Marital status: Divorced    Spouse name: Not on file   Number of children: Not on file   Years of education: Not on file   Highest education level: Not on file  Occupational History   Not on file  Tobacco Use   Smoking status: Never   Smokeless tobacco: Never  Vaping Use   Vaping status: Never Used  Substance and Sexual Activity   Alcohol use: No   Drug use: No   Sexual activity: Not Currently    Birth control/protection: Post-menopausal    Comment: 1st intercourse 68 yo-Fewer than 5 partners  Other Topics Concern   Not on file  Social History Narrative   Uses cane at home.   Social Drivers of Corporate investment banker Strain: Not on file  Food Insecurity: Not on file  Transportation Needs: Not on file  Physical Activity: Not on file  Stress: Not on file  Social Connections: Not on file  Intimate Partner Violence: Not on file    Family History  Problem Relation Age of Onset   Diabetes Mother    Hypertension Mother    Heart disease Mother    Alcohol abuse Mother    Kidney disease Mother    Cancer Father        COLON   Alcohol abuse Father    Breast cancer Sister 69   Hypertension Sister    Kidney disease Sister    Breast cancer Sister        breast cancer   Hypertension Son    Kidney disease Son     ROS: Malfunction left thigh AV graft  Physical Examination  Vitals:   10/10/23 0855  BP: 119/72  Pulse: 73  Resp: 12  Temp: 98.1 F (36.7 C)  SpO2: 100%   There is no height or weight on file to calculate BMI.  Awake alert oriented Nonlabored respirations There is pulsatility in the left thigh AV graft  CBC    Component Value Date/Time   WBC 3.8 (L) 10/09/2023 0853   RBC 3.25 (L) 10/09/2023 0853   HGB 9.7 (L) 10/09/2023 0853   HGB 10.8 (L) 07/04/2011 1058   HCT 30.8 (L) 10/09/2023 0853   HCT 32.9 (L) 07/04/2011 1058   PLT  129 (L) 10/09/2023 0853   PLT 260 07/04/2011 1058   MCV 94.8 10/09/2023 0853   MCV 99 07/04/2011 1058   MCH 29.8 10/09/2023 0853   MCHC 31.5 10/09/2023 0853   RDW 16.3 (H) 10/09/2023 0853   RDW 17.3 (H) 07/04/2011 1058   LYMPHSABS 1.0 10/09/2023 0853   MONOABS 0.4 10/09/2023 0853   EOSABS 0.1 10/09/2023  0853   BASOSABS 0.0 10/09/2023 0853    BMET    Component Value Date/Time   NA 133 (L) 10/09/2023 0853   NA 139 12/17/2012 0000   NA 138 07/04/2011 1058   K 3.8 10/09/2023 0853   K 4.9 05/19/2012 0937   CL 98 10/09/2023 0853   CL 103 07/04/2011 1058   CO2 24 10/09/2023 0853   CO2 29 07/04/2011 1058   GLUCOSE 89 10/09/2023 0853   GLUCOSE 91 07/04/2011 1058   BUN 13 10/09/2023 0853   BUN 34 (A) 12/17/2012 0000   BUN 18 07/04/2011 1058   CREATININE 5.93 (H) 10/09/2023 0853   CREATININE 5.85 (H) 10/28/2014 0918   CALCIUM 9.5 10/09/2023 0853   CALCIUM 10.2 (H) 07/04/2011 1058   GFRNONAA 7 (L) 10/09/2023 0853   GFRNONAA 7 (L) 07/04/2011 1058   GFRAA 10 (L) 10/03/2018 1134   GFRAA 8 (L) 07/04/2011 1058    COAGS: Lab Results  Component Value Date   INR 1.08 01/03/2017   INR 0.96 09/28/2016   INR 1.06 05/16/2016       ASSESSMENT/PLAN: This is a 68 y.o. female with end-stage renal disease on dialysis via left thigh AV graft that is over 44 years old.  She does have pulsatility in the graft with increased bleeding times to completion.  Plan for shuntogram today in the Cath Lab possible intervention.  We discussed the risk benefits alternatives she demonstrates good understanding.  Jiovany Scheffel C. Sheree, MD [facility] Office: [phone] Pager: [phone]

## 2023-10-11 ENCOUNTER — Encounter (HOSPITAL_COMMUNITY): Payer: Self-pay | Admitting: Vascular Surgery

## 2023-10-11 DIAGNOSIS — N2581 Secondary hyperparathyroidism of renal origin: Secondary | ICD-10-CM | POA: Diagnosis not present

## 2023-10-11 DIAGNOSIS — D509 Iron deficiency anemia, unspecified: Secondary | ICD-10-CM | POA: Diagnosis not present

## 2023-10-11 DIAGNOSIS — Z992 Dependence on renal dialysis: Secondary | ICD-10-CM | POA: Diagnosis not present

## 2023-10-11 DIAGNOSIS — N186 End stage renal disease: Secondary | ICD-10-CM | POA: Diagnosis not present

## 2023-10-11 DIAGNOSIS — D631 Anemia in chronic kidney disease: Secondary | ICD-10-CM | POA: Diagnosis not present

## 2023-10-12 ENCOUNTER — Encounter (HOSPITAL_COMMUNITY): Payer: Self-pay | Admitting: Emergency Medicine

## 2023-10-12 ENCOUNTER — Emergency Department (HOSPITAL_COMMUNITY)
Admission: EM | Admit: 2023-10-12 | Discharge: 2023-10-13 | Disposition: A | Discharge status: Home / Self Care | Attending: Emergency Medicine | Admitting: Emergency Medicine

## 2023-10-12 DIAGNOSIS — M4316 Spondylolisthesis, lumbar region: Secondary | ICD-10-CM | POA: Diagnosis not present

## 2023-10-12 DIAGNOSIS — N186 End stage renal disease: Secondary | ICD-10-CM | POA: Diagnosis not present

## 2023-10-12 DIAGNOSIS — X58XXXA Exposure to other specified factors, initial encounter: Secondary | ICD-10-CM | POA: Diagnosis not present

## 2023-10-12 DIAGNOSIS — Z992 Dependence on renal dialysis: Secondary | ICD-10-CM | POA: Insufficient documentation

## 2023-10-12 DIAGNOSIS — Z7902 Long term (current) use of antithrombotics/antiplatelets: Secondary | ICD-10-CM | POA: Insufficient documentation

## 2023-10-12 DIAGNOSIS — M47816 Spondylosis without myelopathy or radiculopathy, lumbar region: Secondary | ICD-10-CM | POA: Diagnosis not present

## 2023-10-12 DIAGNOSIS — S39012A Strain of muscle, fascia and tendon of lower back, initial encounter: Secondary | ICD-10-CM | POA: Insufficient documentation

## 2023-10-12 DIAGNOSIS — M51362 Other intervertebral disc degeneration, lumbar region with discogenic back pain and lower extremity pain: Secondary | ICD-10-CM | POA: Insufficient documentation

## 2023-10-12 DIAGNOSIS — M5136 Other intervertebral disc degeneration, lumbar region with discogenic back pain only: Secondary | ICD-10-CM

## 2023-10-12 DIAGNOSIS — M47815 Spondylosis without myelopathy or radiculopathy, thoracolumbar region: Secondary | ICD-10-CM | POA: Diagnosis not present

## 2023-10-12 DIAGNOSIS — S3992XA Unspecified injury of lower back, initial encounter: Secondary | ICD-10-CM | POA: Diagnosis present

## 2023-10-12 DIAGNOSIS — M549 Dorsalgia, unspecified: Secondary | ICD-10-CM | POA: Diagnosis not present

## 2023-10-12 MED ORDER — OXYCODONE-ACETAMINOPHEN 5-325 MG PO TABS
0.5000 | ORAL_TABLET | Freq: Once | ORAL | Status: AC
  Administered 2023-10-13: 0.5 via ORAL
  Filled 2023-10-12: qty 1

## 2023-10-12 MED ORDER — LIDOCAINE 5 % EX PTCH
1.0000 | MEDICATED_PATCH | CUTANEOUS | Status: DC
  Administered 2023-10-13: 1 via TRANSDERMAL
  Filled 2023-10-12: qty 1

## 2023-10-12 MED ORDER — ACETAMINOPHEN 325 MG PO TABS
650.0000 mg | ORAL_TABLET | Freq: Once | ORAL | Status: AC
  Administered 2023-10-13: 650 mg via ORAL
  Filled 2023-10-12: qty 2

## 2023-10-12 NOTE — ED Triage Notes (Signed)
 Pt presents to the ED via POV with complaints of chronic back pain for several years. No acute change nor injury since the last visit for this complaint. She notes taking Tramadol  today without improvement in her sx. Of note, pt is a dialysis pt (MWF) and is compliant with treatment. A&Ox4 at this time. Denies CP or SOB.

## 2023-10-12 NOTE — ED Provider Notes (Signed)
 [facility] AT [facility] Provider Note  CSN: [id number] Arrival date & time: 10/12/23 2136  Chief Complaint(s) Back Pain  HPI [name] Marks is a 68 y.o. female with a past medical history listed below including ESRD on dialysis MWF here for exacerbation of intermittent chronic lower back pain.  Mostly right-sided.  Was seen here several days ago for the same and had reassuring labs ruling out infectious process.  Patient denies any recent falls or trauma that would have caused any fractures.  She does not have any bowel incontinence or lower extremity weakness/loss of sensation.  Pain is worse with range of motion, certain positions and palpation.  Reports that she has taken tramadol  which does not do anything for the pain.  The history is provided by the patient.    Past Medical History Past Medical History:  Diagnosis Date   Anemia    Aortic valve prosthesis present 02/25/2016   Arthritis    knees (01/03/2017)   AVD (aortic valve disease) 07/12/2016   Backache 12/06/2008   Bacteremia due to coagulase-negative Staphylococcus    Carpal tunnel syndrome    Cerebral embolism with transient ischemic attack (TIA)    Cholelithiases 01/28/2017   Chronic female pelvic pain 08/08/2012   Complication of anesthesia    01/01/17- 'a long time ago, difficulty breathimg, not sure if it was due to anesthesia or not.   CVA (cerebral vascular accident) (HCC) 05/16/2016   Dialysis patient (HCC)    End stage renal disease on dialysis (HCC)    MWF; [address] (01/03/2017)   Endocarditis    Esophageal reflux 12/06/2008   GERD (gastroesophageal reflux disease)    Gout    History of blood transfusion 2017   related to blood poison (01/03/2017)   Increased endometrial stripe thickness 11/03/2015   Neck pain    Osteoporosis 09/2016   T score -2.6   Pain and swelling of right upper extremity 12/20/2015   Pulmonary HTN (HCC) 07/12/2016   Renal dialysis  device, implant, or graft complication 12/20/2015   Renal insufficiency    S/P cholecystectomy 02/14/2017   Septic shock (HCC)    Staphylococcus aureus bacteremia    TIA (transient ischemic attack)    several (01/03/2017)   Patient Active Problem List   Diagnosis Date Noted   Gait difficulty 09/09/2023   Lumbar pain 09/09/2023   Presence of heart assist device (HCC) 09/04/2023   Seasonal allergic rhinitis due to pollen 05/30/2023   Sensorineural hearing loss (SNHL) of both ears 05/30/2023   S/P aortic valve replacement 05/30/2023   History of colonic polyps 05/16/2021   Endocarditis 07/30/2020   Hyperparathyroidism due to renal insufficiency (HCC) 07/30/2020   Tooth, broken 12/18/2018   Hypotension 10/03/2018   Diarrhea, unspecified 08/25/2018   Orthostatic hypotension 03/14/2018   S/P cholecystectomy 02/14/2017   Osteoporosis 12/11/2016   Cholelithiasis 11/07/2016   Cerebral embolism with transient ischemic attack (TIA)    Bilateral carpal tunnel syndrome 08/09/2016   AVD (aortic valve disease) 07/12/2016   Pulmonary HTN (HCC) 07/12/2016   Carpal tunnel syndrome 07/05/2016   Disorder of both mastoids 05/29/2016   CVA (cerebral vascular accident) (HCC) 05/16/2016   History of aortic valve replacement with bioprosthetic valve 05/08/2016   Bacteremia due to coagulase-negative Staphylococcus    History of endocarditis    Anemia    Complex endometrial hyperplasia with atypia 11/08/2015   Dependence on renal dialysis (HCC) 12/15/2014   Obesity 10/28/2014   Right carotid bruit 01/08/2013  Generalized headaches 01/08/2013   Chronic female pelvic pain 08/08/2012   Gout, unspecified 12/06/2008   Esophageal reflux 12/06/2008   Backache 12/06/2008   FIBROIDS, UTERUS 11/24/2008   Diverticulosis of large intestine without perforation or abscess without bleeding 03/22/2008   ESRD (end stage renal disease) on dialysis (HCC) 07/01/2006   Anemia in chronic kidney disease 12/18/2001    Home Medication(s) Prior to Admission medications   Medication Sig Start Date End Date Taking? Authorizing Provider  diclofenac  Sodium (VOLTAREN ) 1 % GEL Apply 2 g topically 4 (four) times daily as needed. 10/13/23  Yes Rukaya Kleinschmidt, Raynell Moder, MD  lidocaine  (LIDODERM ) 5 % Place 1 patch onto the skin daily. Remove & Discard patch within 12 hours or as directed by MD 10/13/23  Yes Vergene Marland, Raynell Moder, MD  methocarbamol  (ROBAXIN ) 500 MG tablet Take 1-2 tablets (500-1,000 mg total) by mouth every 8 (eight) hours as needed for muscle spasms. 10/13/23  Yes Jermichael Belmares, Raynell Moder, MD  cinacalcet  (SENSIPAR ) 90 MG tablet Take 90 mg by mouth at bedtime. Patient not taking: Reported on 09/04/2023    [provider]  clopidogrel  (PLAVIX ) 75 MG tablet TAKE 1 TABLET BY MOUTH EVERY DAY 07/10/23   Conte, Tessa N, PA-C  famotidine  (PEPCID ) 20 MG tablet Take 20 mg by mouth daily after supper. Patient not taking: Reported on 09/04/2023    [provider]  fluticasone  (FLONASE ) 50 MCG/ACT nasal spray Place 2 sprays into both nostrils daily. Patient not taking: Reported on 09/04/2023 04/20/23   Teresa Almarie LABOR, PA-C  Loratadine  5 MG TBDP Take 1 tablet (5 mg total) by mouth daily. Patient not taking: Reported on 09/04/2023 05/30/23   Petrina Pries, NP  midodrine  (PROAMATINE ) 10 MG tablet Take 10 mg by mouth See admin instructions. Take 1 tablet (10 mg) by mouth before dialysis & take 1 tablet (10 mg) while at dialysis on Mondays, Wednesdays & Fridays. 08/15/20   [provider]  sevelamer  carbonate (RENVELA ) 800 MG tablet Take 2,400 mg by mouth See admin instructions. Take 3 tablets (2400 mg) by mouth after each meal and after each snack Patient not taking: Reported on 09/04/2023    [provider]  traMADol  (ULTRAM ) 50 MG tablet Take 1 tablet (50 mg total) by mouth every 12 (twelve) hours as needed. 09/04/23   Petrina Pries, NP  traMADol  (ULTRAM ) 50 MG tablet Take 1 tablet (50 mg total) by  mouth every 6 (six) hours as needed. 10/09/23   Myriam Dorn BROCKS, PA                                                                                                                                    Allergies Iodinated contrast media, Cefazolin , Diphenhydramine , and Naproxen  sodium  Review of Systems Review of Systems As noted in HPI  Physical Exam Vital Signs  I have reviewed the triage vital signs BP 102/61 (BP Location: Left Arm)  Pulse 79   Temp 97.9 F (36.6 C) (Oral)   Resp 12   Ht 4' 11 (1.499 m)   Wt 59 kg   SpO2 100%   BMI 26.26 kg/m  . Physical Exam Vitals reviewed.  Constitutional:      General: She is not in acute distress.    Appearance: She is well-developed. She is not diaphoretic.  HENT:     Head: Normocephalic and atraumatic.     Right Ear: External ear normal.     Left Ear: External ear normal.     Nose: Nose normal.  Eyes:     General: No scleral icterus.    Conjunctiva/sclera: Conjunctivae normal.  Neck:     Trachea: Phonation normal.  Cardiovascular:     Rate and Rhythm: Normal rate and regular rhythm.  Pulmonary:     Effort: Pulmonary effort is normal. No respiratory distress.     Breath sounds: No stridor.  Abdominal:     General: There is no distension.  Musculoskeletal:        General: Normal range of motion.     Cervical back: Normal range of motion.     Thoracic back: Spasms and tenderness present.     Lumbar back: Spasms and tenderness present.       Back:  Neurological:     Mental Status: She is alert and oriented to person, place, and time.  Psychiatric:        Behavior: Behavior normal.     ED Results and Treatments Labs (all labs ordered are listed, but only abnormal results are displayed) Labs Reviewed - No data to display                                                                                                                       EKG  EKG Interpretation Date/Time:    Ventricular Rate:    PR Interval:     QRS Duration:    QT Interval:    QTC Calculation:   R Axis:      Text Interpretation:         Radiology DG Lumbar Spine 2-3 Views Result Date: 10/13/2023 CLINICAL DATA:  Back pain. EXAM: LUMBAR SPINE - 2-3 VIEW; THORACIC SPINE 2 VIEWS COMPARISON:  Chest radiograph 05/23/2023 and lumbar spine radiograph 04/04/2018 FINDINGS: No evidence of acute fracture or traumatic listhesis in the thoracolumbar spine mild spondylosis in the thoracic spine. Slight right convex lumbar curve. Unchanged grade 1 anterolisthesis of L4 with advanced disc space height loss and degenerative endplate changes at L4-L5. IMPRESSION: 1. No evidence of acute fracture or traumatic listhesis in the thoracolumbar spine. 2. Unchanged grade 1 anterolisthesis of L4 with advanced disc space height loss and degenerative endplate changes at L4-L5. Electronically Signed   By: Norman Gatlin M.D.   On: 10/13/2023 00:37   DG Thoracic Spine 2 View Result Date: 10/13/2023 CLINICAL DATA:  Back pain. EXAM: LUMBAR SPINE - 2-3 VIEW; THORACIC SPINE 2 VIEWS COMPARISON:  Chest  radiograph 05/23/2023 and lumbar spine radiograph 04/04/2018 FINDINGS: No evidence of acute fracture or traumatic listhesis in the thoracolumbar spine mild spondylosis in the thoracic spine. Slight right convex lumbar curve. Unchanged grade 1 anterolisthesis of L4 with advanced disc space height loss and degenerative endplate changes at L4-L5. IMPRESSION: 1. No evidence of acute fracture or traumatic listhesis in the thoracolumbar spine. 2. Unchanged grade 1 anterolisthesis of L4 with advanced disc space height loss and degenerative endplate changes at L4-L5. Electronically Signed   By: Norman Gatlin M.D.   On: 10/13/2023 00:37    Medications Ordered in ED Medications  lidocaine  (LIDODERM ) 5 % 1 patch (1 patch Transdermal Patch Applied 10/13/23 0002)  acetaminophen  (TYLENOL ) tablet 650 mg (650 mg Oral Given 10/13/23 0001)  oxyCODONE -acetaminophen  (PERCOCET/ROXICET)  5-325 MG per tablet 0.5 tablet (0.5 tablets Oral Given 10/13/23 0002)   Procedures Procedures  (including critical care time) Medical Decision Making / ED Course   Medical Decision Making Amount and/or Complexity of Data Reviewed Radiology: ordered.  Risk OTC drugs. Prescription drug management.    Exacerbation of recurrent lower back pain.  Differential diagnosis considered.  Presentation is most suspicious for muscle strain/spasm of the lumbosacral region.  No prior imaging of the lumbar spine and thoracic spine obtained.  X-ray here negative for any acute fracture.  She does have grade 1 anterior listhesis of L4 with degenerative disc disease and disc height loss. Patient reports that she is already aware of this and is followed by spine surgeon.  She reports that she is going through physical therapy for her knees and will discuss PT for her lower back as well.  No suspicion for cauda equina or other serious spinal etiology requiring advanced imaging at this time.  Patient provided with dose of Tylenol , Percocet and lidocaine  patch for pain.    Final Clinical Impression(s) / ED Diagnoses Final diagnoses:  Strain of lumbar region, initial encounter  Degeneration of intervertebral disc of lumbar region with discogenic back pain   The patient appears reasonably screened and/or stabilized for discharge and I doubt any other medical condition or other [facility] requiring further screening, evaluation, or treatment in the ED at this time. I have discussed the findings, Dx and Tx plan with the patient/family who expressed understanding and agree(s) with the plan. Discharge instructions discussed at length. The patient/family was given strict return precautions who verbalized understanding of the instructions. No further questions at time of discharge.  Disposition: Discharge  Condition: Good  ED Discharge Orders          Ordered    lidocaine  (LIDODERM ) 5 %  Every 24 hours         10/13/23 0043    methocarbamol  (ROBAXIN ) 500 MG tablet  Every 8 hours PRN        10/13/23 0043    diclofenac  Sodium (VOLTAREN ) 1 % GEL  4 times daily PRN        10/13/23 0043             Follow Up: No follow-up provider specified.   This chart was dictated using voice recognition software.  Despite best efforts to proofread,  errors can occur which can change the documentation meaning.    Trine Raynell Moder, MD 10/13/23 [phone]

## 2023-10-13 ENCOUNTER — Emergency Department (HOSPITAL_COMMUNITY)

## 2023-10-13 ENCOUNTER — Other Ambulatory Visit (HOSPITAL_COMMUNITY)

## 2023-10-13 DIAGNOSIS — M549 Dorsalgia, unspecified: Secondary | ICD-10-CM | POA: Diagnosis not present

## 2023-10-13 DIAGNOSIS — S39012A Strain of muscle, fascia and tendon of lower back, initial encounter: Secondary | ICD-10-CM | POA: Diagnosis not present

## 2023-10-13 DIAGNOSIS — M47816 Spondylosis without myelopathy or radiculopathy, lumbar region: Secondary | ICD-10-CM | POA: Diagnosis not present

## 2023-10-13 DIAGNOSIS — M4316 Spondylolisthesis, lumbar region: Secondary | ICD-10-CM | POA: Diagnosis not present

## 2023-10-13 DIAGNOSIS — M47815 Spondylosis without myelopathy or radiculopathy, thoracolumbar region: Secondary | ICD-10-CM | POA: Diagnosis not present

## 2023-10-13 MED ORDER — DICLOFENAC SODIUM 1 % EX GEL
2.0000 g | Freq: Four times a day (QID) | CUTANEOUS | 0 refills | Status: AC | PRN
Start: 2023-10-13 — End: ?

## 2023-10-13 MED ORDER — LIDOCAINE 5 % EX PTCH: 1.0000 | MEDICATED_PATCH | CUTANEOUS | 0 refills | Status: DC

## 2023-10-13 MED ORDER — METHOCARBAMOL 500 MG PO TABS: 500.0000 mg | ORAL_TABLET | Freq: Three times a day (TID) | ORAL | 0 refills | Status: AC | PRN

## 2023-10-13 NOTE — Discharge Instructions (Addendum)
 In addition to the prescribed medication, you can take Acetaminophen  (Tylenol ), topical muscle creams such as SalonPas, [employer], Bengay, etc. Please stretch, apply ice or heat (whichever helps), and have massage therapy for additional assistance.  For pain control you may take 1000 mg of acetaminophen  (Tylenol ) every 8 hours as needed.  Please limit 24 hr usage of the following medicines: - Acetaminophen  (Tylenol ) to 4000 mg    Please note that other over-the-counter medicine may contain acetaminophen  as a component of their ingredients.

## 2023-10-14 DIAGNOSIS — D631 Anemia in chronic kidney disease: Secondary | ICD-10-CM | POA: Diagnosis not present

## 2023-10-14 DIAGNOSIS — Z992 Dependence on renal dialysis: Secondary | ICD-10-CM | POA: Diagnosis not present

## 2023-10-14 DIAGNOSIS — D509 Iron deficiency anemia, unspecified: Secondary | ICD-10-CM | POA: Diagnosis not present

## 2023-10-14 DIAGNOSIS — N186 End stage renal disease: Secondary | ICD-10-CM | POA: Diagnosis not present

## 2023-10-14 DIAGNOSIS — N2581 Secondary hyperparathyroidism of renal origin: Secondary | ICD-10-CM | POA: Diagnosis not present

## 2023-10-14 NOTE — Therapy (Signed)
 OUTPATIENT PHYSICAL THERAPY LOWER EXTREMITY TREATMENT  Patient Name: [name] Marks MRN: [id number] DOB:03/02/1955, 68 y.o., female Today's Date: 10/15/2023  END OF SESSION:  PT End of Session - 10/15/23 1416     Visit Number 3    Number of Visits 17    Date for PT Re-Evaluation 11/21/23    Authorization Type Medicare [employer] senior    PT Start Time 1355    PT Stop Time 1425    PT Time Calculation (min) 30 min    Activity Tolerance Patient tolerated treatment well    Behavior During Therapy [facility] for tasks assessed/performed            Past Medical History:  Diagnosis Date   Anemia    Aortic valve prosthesis present 02/25/2016   Arthritis    knees (01/03/2017)   AVD (aortic valve disease) 07/12/2016   Backache 12/06/2008   Bacteremia due to coagulase-negative Staphylococcus    Carpal tunnel syndrome    Cerebral embolism with transient ischemic attack (TIA)    Cholelithiases 01/28/2017   Chronic female pelvic pain 08/08/2012   Complication of anesthesia    01/01/17- 'a long time ago, difficulty breathimg, not sure if it was due to anesthesia or not.   CVA (cerebral vascular accident) (HCC) 05/16/2016   Dialysis patient (HCC)    End stage renal disease on dialysis (HCC)    MWF; [address] (01/03/2017)   Endocarditis    Esophageal reflux 12/06/2008   GERD (gastroesophageal reflux disease)    Gout    History of blood transfusion 2017   related to blood poison (01/03/2017)   Increased endometrial stripe thickness 11/03/2015   Neck pain    Osteoporosis 09/2016   T score -2.6   Pain and swelling of right upper extremity 12/20/2015   Pulmonary HTN (HCC) 07/12/2016   Renal dialysis device, implant, or graft complication 12/20/2015   Renal insufficiency    S/P cholecystectomy 02/14/2017   Septic shock (HCC)    Staphylococcus aureus bacteremia    TIA (transient ischemic attack)    several (01/03/2017)   Past Surgical History:  Procedure Laterality Date    A/V SHUNT INTERVENTION Left 10/10/2023   Procedure: A/V SHUNT INTERVENTION;  Surgeon: Sheree Penne Bruckner, MD;  Location: [facility];  Service: Cardiovascular;  Laterality: Left;   AORTIC VALVE REPLACEMENT N/A 02/25/2016   Procedure: AORTIC VALVE REPLACEMENT (AVR) implanted with Magna Ease Aortic valve size 21mm;  Surgeon: Elspeth JAYSON Millers, MD;  Location: [facility] OR;  Service: Open Heart Surgery;  Laterality: N/A;   AV FISTULA PLACEMENT Left 01/03/2017   Procedure: INSERTION OF ARTERIOVENOUS (AV) GORE-TEX GRAFT LEFT THIGH;  Surgeon: Serene Gaile ORN, MD;  Location: [facility] OR;  Service: Vascular;  Laterality: Left;   AVGG REMOVAL Right 02/17/2016   Procedure: REMOVAL OF TWO ARTERIOVENOUS GORETEX GRAFTS (AVGG);  Surgeon: Bruckner GORMAN Blade, MD;  Location: [facility] OR;  Service: Vascular;  Laterality: Right;   BIOPSY  05/16/2021   Procedure: BIOPSY;  Surgeon: Dianna Specking, MD;  Location: [facility];  Service: Endoscopy;;   BREAST BIOPSY Left 2013   stereo    BREAST BIOPSY Right 2011   stereo    CARDIAC VALVE REPLACEMENT     CHOLECYSTECTOMY N/A 01/30/2017   Procedure: LAPAROSCOPIC CHOLECYSTECTOMY;  Surgeon: Vanderbilt Ned, MD;  Location: [facility] OR;  Service: General;  Laterality: N/A;   COLONOSCOPY W/ POLYPECTOMY     COLONOSCOPY WITH PROPOFOL  N/A 05/16/2021   Procedure: COLONOSCOPY WITH PROPOFOL ;  Surgeon: Dianna Specking, MD;  Location: [facility] ENDOS[facility];  Service: Endoscopy;  Laterality: N/A;   DG AV DIALYSIS GRAFT DECLOT OR     DILATATION & CURETTAGE/HYSTEROSCOPY WITH TRUECLEAR N/A 11/06/2012   Procedure: DILATATION & CURETTAGE/HYSTEROSCOPY WITH TRUECLEAR;  Surgeon: Curlee VEAR Guan, MD;  Location: [facility] ORS;  Service: Gynecology;  Laterality: N/A;  Truclear Resectoscopic Polypectomy    INSERTION OF DIALYSIS CATHETER N/A 02/19/2016   Procedure: INSERTION OF Left Internal Jugular DIALYSIS CATHETER;  Surgeon: Lonni GORMAN Blade, MD;  Location: [facility] OR;  Service: Vascular;  Laterality: N/A;   LOOP  RECORDER INSERTION N/A 10/01/2016   Procedure: LOOP RECORDER INSERTION;  Surgeon: Inocencio Soyla Lunger, MD;  Location: [facility];  Service: Cardiovascular;  Laterality: N/A;   PATCH ANGIOPLASTY Right 02/17/2016   Procedure: PATCH ANGIOPLASTY;  Surgeon: Lonni GORMAN Blade, MD;  Location: [facility] OR;  Service: Vascular;  Laterality: Right;   PERIPHERAL VASCULAR CATHETERIZATION N/A 09/14/2014   Procedure: A/V Shuntogram/Fistulagram;  Surgeon: Cordella KANDICE Shawl, MD;  Location: [facility];  Service: Cardiovascular;  Laterality: N/A;   PERIPHERAL VASCULAR CATHETERIZATION N/A 09/14/2014   Procedure: A/V Shunt Intervention;  Surgeon: Cordella KANDICE Shawl, MD;  Location: [facility];  Service: Cardiovascular;  Laterality: N/A;   PERIPHERAL VASCULAR CATHETERIZATION Right 12/09/2014   Procedure: A/V Shuntogram/Fistulagram;  Surgeon: Selinda GORMAN Gu, MD;  Location: [facility];  Service: Cardiovascular;  Laterality: Right;   PERIPHERAL VASCULAR CATHETERIZATION N/A 12/09/2014   Procedure: A/V Shunt Intervention;  Surgeon: Selinda GORMAN Gu, MD;  Location: [facility];  Service: Cardiovascular;  Laterality: N/A;   PERIPHERAL VASCULAR CATHETERIZATION Right 05/24/2015   Procedure: A/V Shuntogram;  Surgeon: Gaile LELON New, MD;  Location: [facility];  Service: Cardiovascular;  Laterality: Right;   PERIPHERAL VASCULAR CATHETERIZATION Right 05/24/2015   Procedure: Peripheral Vascular Balloon Angioplasty;  Surgeon: Gaile LELON New, MD;  Location: [facility];  Service: Cardiovascular;  Laterality: Right;  right arm shunt   PERIPHERAL VASCULAR CATHETERIZATION N/A 06/13/2015   Procedure: A/V Shuntogram/Fistulagram;  Surgeon: Selinda GORMAN Gu, MD;  Location: [facility];  Service: Cardiovascular;  Laterality: N/A;   PERIPHERAL VASCULAR CATHETERIZATION N/A 06/13/2015   Procedure: A/V Shunt Intervention;  Surgeon: Selinda GORMAN Gu, MD;  Location: [facility];  Service:  Cardiovascular;  Laterality: N/A;   TEE WITHOUT CARDIOVERSION N/A 02/22/2016   Procedure: TRANSESOPHAGEAL ECHOCARDIOGRAM (TEE);  Surgeon: Vinie JAYSON Maxcy, MD;  Location: [facility] ENDOSCOPY;  Service: Cardiovascular;  Laterality: N/A;   TEE WITHOUT CARDIOVERSION N/A 02/25/2016   Procedure: TRANSESOPHAGEAL ECHOCARDIOGRAM (TEE);  Surgeon: Elspeth JAYSON Millers, MD;  Location: [facility] OR;  Service: Open Heart Surgery;  Laterality: N/A;   TEE WITHOUT CARDIOVERSION N/A 10/01/2016   Procedure: TRANSESOPHAGEAL ECHOCARDIOGRAM (TEE);  Surgeon: Maxcy Vinie JAYSON, MD;  Location: [facility] ENDOSCOPY;  Service: Cardiovascular;  Laterality: N/A;   TUBAL LIGATION  1983   VENOUS ANGIOPLASTY  10/10/2023   Procedure: VENOUS ANGIOPLASTY;  Surgeon: Sheree Penne Lonni, MD;  Location: [facility];  Service: Cardiovascular;;  Venous Anastomosis   Patient Active Problem List   Diagnosis Date Noted   Gait difficulty 09/09/2023   Lumbar pain 09/09/2023   Presence of heart assist device (HCC) 09/04/2023   Seasonal allergic rhinitis due to pollen 05/30/2023   Sensorineural hearing loss (SNHL) of both ears 05/30/2023   S/P aortic valve replacement 05/30/2023   History of colonic polyps 05/16/2021   Endocarditis 07/30/2020   Hyperparathyroidism due to renal insufficiency (HCC) 07/30/2020  Tooth, broken 12/18/2018   Hypotension 10/03/2018   Diarrhea, unspecified 08/25/2018   Orthostatic hypotension 03/14/2018   S/P cholecystectomy 02/14/2017   Osteoporosis 12/11/2016   Cholelithiasis 11/07/2016   Cerebral embolism with transient ischemic attack (TIA)    Bilateral carpal tunnel syndrome 08/09/2016   AVD (aortic valve disease) 07/12/2016   Pulmonary HTN (HCC) 07/12/2016   Carpal tunnel syndrome 07/05/2016   Disorder of both mastoids 05/29/2016   CVA (cerebral vascular accident) (HCC) 05/16/2016   History of aortic valve replacement with bioprosthetic valve 05/08/2016   Bacteremia due to coagulase-negative Staphylococcus     History of endocarditis    Anemia    Complex endometrial hyperplasia with atypia 11/08/2015   Dependence on renal dialysis (HCC) 12/15/2014   Obesity 10/28/2014   Right carotid bruit 01/08/2013   Generalized headaches 01/08/2013   Chronic female pelvic pain 08/08/2012   Gout, unspecified 12/06/2008   Esophageal reflux 12/06/2008   Backache 12/06/2008   FIBROIDS, UTERUS 11/24/2008   Diverticulosis of large intestine without perforation or abscess without bleeding 03/22/2008   ESRD (end stage renal disease) on dialysis (HCC) 07/01/2006   Anemia in chronic kidney disease 12/18/2001    PCP: Petrina Pries, NP   REFERRING PROVIDER: Petrina Pries, NP   REFERRING DIAG: R26.9 (ICD-10-CM) - Gait difficulty M54.50 (ICD-10-CM) - Lumbar pain  THERAPY DIAG:  Other abnormalities of gait and mobility  Pain in thoracic spine  Rationale for Evaluation and Treatment: Rehabilitation  ONSET DATE: early July  SUBJECTIVE:   SUBJECTIVE STATEMENT: Pt  was seen 8/12 for PT session reporting no back pain.  Pt is a poor historian but with the assistance of chart information it was determined she went to the ER due to waking up 8/13 with increasing LBP not the same pain as mid back pain she has been having PT for but it was preventing her from going to dialysis so she went to ED to get checked out.  She was treated and had some work done on her dialysis port and then discharged.  She went back to ED on 10/12/23 for LBP again, mostly on R side.  She was given lidocaine  patches which she states are helping and today she has no pain.   She also states the front door hit her R lower leg.  It hurt a little but no pain now.    PERTINENT HISTORY: On dialysis,  ESRD, heart assistive device PAIN:  Are you having pain? Yes: NPRS scale: /10 Pain location: upper back, bilateral shoulders Pain description: dull Aggravating factors: motion, ambulation, transfers Relieving factors: nothing  PRECAUTIONS: See hx  --Pt has port for dialysis at anterior L thigh  RED FLAGS: None   WEIGHT BEARING RESTRICTIONS: No  FALLS:  Has patient fallen in last 6 months? Yes. Number of falls 1  LIVING ENVIRONMENT: Lives with: lives with their family Lives in: House/apartment Stairs: No Has following equipment at home: Single point cane  OCCUPATION: not working  PLOF: Independent  PATIENT GOALS: Be steady with walking and less pain in upper back/shoulders  NEXT MD VISIT: 01/05/24 per chart  OBJECTIVE:  Note: Objective measures were completed at Evaluation unless otherwise noted.  DIAGNOSTIC FINDINGS: R Knee x rays In April 2025  FINDINGS: Chondrocalcinosis identified. Small osteophytes seen of all 3 compartments. Subchondral cyst formation along the medial compartment along the tibial plateau. No fracture or dislocation. Osteopenia. No joint effusion lateral view. Vascular calcifications.   IMPRESSION: Tracking metal degenerative changes.  Chondrocalcinosis.  PATIENT SURVEYS:  PSFS: THE PATIENT SPECIFIC FUNCTIONAL SCALE  Place score of 0-10 (0 = unable to perform activity and 10 = able to perform activity at the same level as before injury or problem)  Activity Date: 09/26/23    Lifting 5 lbs 5    2.Carrying 5 lbs 3    3.Walking without cane 5    4. Making the bed 4    Total Score 4.25      Total Score = Sum of activity scores/number of activities  Minimally Detectable Change: 3 points (for single activity); 2 points (for average score)  [facility] (nd). The Patient Specific Functional Scale . Retrieved from [email or web address]   COGNITION: Overall cognitive status: Within functional limits for tasks assessed     SENSATION: Not tested  EDEMA: Not addressed  MUSCLE LENGTH: Hamstrings: Moderate to sever restriction hamstrings bilaterally   POSTURE: rounded shoulders and forward head  SHOULDER AROM:  Flexion L 70  degrees R 60 degrees, Abduction L 70 degrees R 90 degrees  SPINAL ROM : Restricted 50% or more throughout  LOWER EXTREMITY MMT:  MMT Right eval Left eval  Hip flexion 4- 4-  Hip extension 4- 4-  Hip abduction 4- 4-  Hip adduction 4 4  Hip internal rotation    Hip external rotation    Knee flexion    Knee extension 4 3+  Ankle dorsiflexion    Ankle plantarflexion    Ankle inversion    Ankle eversion     (Blank rows = not tested)  LOWER EXTREMITY SPECIAL TESTS:  No speciality tests for orthopedic issues completed.  FUNCTIONAL TESTS:  5 times sit to stand: 36 sec  GAIT: Distance walked: 40 feet with CGA with no AD during eval Assistive device utilized: Single point cane Level of assistance: Modified independence Comments: Gait pattern: Decreased heel strike , decreased push off, knees flexed in stride, shoulders rounded, increased sway                                                                                                                                 TREATMENT DATE:  10/15/23 Ther Ex [place] 2x10 SAQ 2x10 Hip adduction ball squeezes 10x10 sec Supine shoulder flexion with dowel 10x Trunk rotations 2x10 Neuro Re ed Seated toe raises 2x10 Seated marching 2x10  10/08/23 Ther Ex [place] 2x10 SAQ 2x10 Hip adduction ball squeezes 10x10 sec Supine shoulder flexion with dowel 10x Neuro Re ed Seated toe raises 2x10 Seated marching 2x10 Mini squats holding onto counter 2x10 Side steps at counter 2x10 There Act Pulleys 3 min Wall ladder 2x each hand      09/26/23 Evaluation and instruction and 1 set run through of HEP.   PATIENT EDUCATION:  Education details: Pt given written paper. Person educated: Patient Education method: Explanation, Demonstration, Tactile cues, and Verbal cues Education comprehension: verbalized understanding and returned demonstration  HOME EXERCISE PROGRAM: Access Code: [email or web address] URL:  [email or web address] Date:  09/26/2023 Prepared by: Burnard Meth  Exercises - Supine Bridge  - 2 x daily - 7 x weekly - 2 sets - 8 reps - Supine Hip Adduction Isometric with Ball  - 2 x daily - 7 x weekly - 2 sets - 8 reps - 3 hold - Supine Knee Extension Strengthening  - 2 x daily - 7 x weekly - 2 sets - 8 reps - 2 hold - Seated Shoulder Shrugs  - 2 x daily - 7 x weekly - 2 sets - 10 reps - Seated Scapular Retraction  - 2 x daily - 7 x weekly - 2 sets - 10 reps  ASSESSMENT:  CLINICAL IMPRESSION: Pt given frequent breaks between sets and pain level was checked frequently.  No pain with there ex program.  Tolerated and was able to complete a shortened PT session. ( due to pt being late for appointment)  Eval-Patient is a 69 y.o. female who was seen today for physical therapy evaluation and treatment for back pain and gait abnormalities. She presents with decreased spinal motion, dec shoulder ROM, dec LE/UE strength, altered gait, dec endurance for ADLs/ambulation and pain.  She would benefit from skilled PT to address these deficits and improve LOF at home and with community activities.  OBJECTIVE IMPAIRMENTS: Abnormal gait, decreased activity tolerance, decreased balance, decreased coordination, decreased ROM, decreased strength, impaired flexibility, and pain.   ACTIVITY LIMITATIONS: carrying, lifting, standing, squatting, stairs, and transfers  PARTICIPATION LIMITATIONS: driving and community activity  PERSONAL FACTORS: 3+ comorbidities also affecting patient's functional outcome.   REHAB POTENTIAL: Fair See personal factors  CLINICAL DECISION MAKING: Stable/uncomplicated  EVALUATION COMPLEXITY: Moderate   GOALS: Goals reviewed with patient? Yes  SHORT TERM GOALS: Target date: 11/07/2023   Pt to be independent with HEP. Baseline: Goal status: INITIAL  2.  Decrease pain by 1 level. Baseline:5/10  Goal status: INITIAL  3.  Pt able to ambulate 100 feet with  SPC and SBAx1. Baseline: 40-50 feet with assist Goal status: INITIAL  LONG TERM GOALS: Target date: 12/19/2023    Increase LE strength  to 4+ throughout LE to increase strength for ambulation. Baseline: 4- Goal status: INITIAL  2.  Increase shoulder AROM to at least 90 degrees flexion and abduction. Baseline: 70+ Goal status: INITIAL  3.  Pt able to ambulate 200 ft with SPC if needed to increase community ambulation. Baseline: 40 ft Goal status: INITIAL  4.  Improve sit to stand score by 10+ seconds. Baseline:  Goal status: INITIAL  5.  Improve PSFS by 2 points to 6.25. Baseline: 4.25 Goal status: INITIAL  6.  Decrease max pain to 3/10 in upper back with shoulder activity. Baseline: 5/10 Goal status: INITIAL   PLAN:  PT FREQUENCY: 1-2x/week  PT DURATION: 12 weeks  PLANNED INTERVENTIONS: [id number]- PT Re-evaluation, 97110-Therapeutic exercises, 97530- Therapeutic activity, [id number]- Neuromuscular re-education, 97535- Self Care, [id number]- Manual therapy, [id number]- Gait training, [phone]- Aquatic Therapy, [phone]- Vasopneumatic device, Patient/Family education, Balance training, Stair training, Joint mobilization, Cryotherapy, and Moist heat  PLAN FOR NEXT SESSION: Increase LE strengthening, balance as endurance allows.   Burnard CHRISTELLA Meth, PT 10/15/2023, 4:11 PM

## 2023-10-15 ENCOUNTER — Ambulatory Visit (INDEPENDENT_AMBULATORY_CARE_PROVIDER_SITE_OTHER)

## 2023-10-15 DIAGNOSIS — M546 Pain in thoracic spine: Secondary | ICD-10-CM | POA: Diagnosis not present

## 2023-10-15 DIAGNOSIS — R2689 Other abnormalities of gait and mobility: Secondary | ICD-10-CM

## 2023-10-16 DIAGNOSIS — Z992 Dependence on renal dialysis: Secondary | ICD-10-CM | POA: Diagnosis not present

## 2023-10-16 DIAGNOSIS — N186 End stage renal disease: Secondary | ICD-10-CM | POA: Diagnosis not present

## 2023-10-16 DIAGNOSIS — N2581 Secondary hyperparathyroidism of renal origin: Secondary | ICD-10-CM | POA: Diagnosis not present

## 2023-10-16 DIAGNOSIS — D509 Iron deficiency anemia, unspecified: Secondary | ICD-10-CM | POA: Diagnosis not present

## 2023-10-16 DIAGNOSIS — D631 Anemia in chronic kidney disease: Secondary | ICD-10-CM | POA: Diagnosis not present

## 2023-10-18 DIAGNOSIS — D631 Anemia in chronic kidney disease: Secondary | ICD-10-CM | POA: Diagnosis not present

## 2023-10-18 DIAGNOSIS — N186 End stage renal disease: Secondary | ICD-10-CM | POA: Diagnosis not present

## 2023-10-18 DIAGNOSIS — N2581 Secondary hyperparathyroidism of renal origin: Secondary | ICD-10-CM | POA: Diagnosis not present

## 2023-10-18 DIAGNOSIS — Z992 Dependence on renal dialysis: Secondary | ICD-10-CM | POA: Diagnosis not present

## 2023-10-18 DIAGNOSIS — D509 Iron deficiency anemia, unspecified: Secondary | ICD-10-CM | POA: Diagnosis not present

## 2023-10-21 DIAGNOSIS — D631 Anemia in chronic kidney disease: Secondary | ICD-10-CM | POA: Diagnosis not present

## 2023-10-21 DIAGNOSIS — D509 Iron deficiency anemia, unspecified: Secondary | ICD-10-CM | POA: Diagnosis not present

## 2023-10-21 DIAGNOSIS — Z992 Dependence on renal dialysis: Secondary | ICD-10-CM | POA: Diagnosis not present

## 2023-10-21 DIAGNOSIS — N186 End stage renal disease: Secondary | ICD-10-CM | POA: Diagnosis not present

## 2023-10-21 DIAGNOSIS — N2581 Secondary hyperparathyroidism of renal origin: Secondary | ICD-10-CM | POA: Diagnosis not present

## 2023-10-21 NOTE — Therapy (Signed)
 OUTPATIENT PHYSICAL THERAPY LOWER EXTREMITY TREATMENT  Patient Name: [name] Marks MRN: [id number] DOB:Jul 13, 1955, 68 y.o., female Today's Date: 10/22/2023  END OF SESSION:***  PT End of Session - 10/22/23 1428     Visit Number 4    Number of Visits 17    Authorization Type Medicare [employer] senior    PT Start Time 1350    PT Stop Time 1430    PT Time Calculation (min) 40 min    Activity Tolerance Patient tolerated treatment well    Behavior During Therapy [facility] for tasks assessed/performed             Past Medical History:  Diagnosis Date   Anemia    Aortic valve prosthesis present 02/25/2016   Arthritis    knees (01/03/2017)   AVD (aortic valve disease) 07/12/2016   Backache 12/06/2008   Bacteremia due to coagulase-negative Staphylococcus    Carpal tunnel syndrome    Cerebral embolism with transient ischemic attack (TIA)    Cholelithiases 01/28/2017   Chronic female pelvic pain 08/08/2012   Complication of anesthesia    01/01/17- 'a long time ago, difficulty breathimg, not sure if it was due to anesthesia or not.   CVA (cerebral vascular accident) (HCC) 05/16/2016   Dialysis patient (HCC)    End stage renal disease on dialysis (HCC)    MWF; [address] (01/03/2017)   Endocarditis    Esophageal reflux 12/06/2008   GERD (gastroesophageal reflux disease)    Gout    History of blood transfusion 2017   related to blood poison (01/03/2017)   Increased endometrial stripe thickness 11/03/2015   Neck pain    Osteoporosis 09/2016   T score -2.6   Pain and swelling of right upper extremity 12/20/2015   Pulmonary HTN (HCC) 07/12/2016   Renal dialysis device, implant, or graft complication 12/20/2015   Renal insufficiency    S/P cholecystectomy 02/14/2017   Septic shock (HCC)    Staphylococcus aureus bacteremia    TIA (transient ischemic attack)    several (01/03/2017)   Past Surgical History:  Procedure Laterality Date   A/V SHUNT INTERVENTION Left  10/10/2023   Procedure: A/V SHUNT INTERVENTION;  Surgeon: Sheree Penne Bruckner, MD;  Location: [facility];  Service: Cardiovascular;  Laterality: Left;   AORTIC VALVE REPLACEMENT N/A 02/25/2016   Procedure: AORTIC VALVE REPLACEMENT (AVR) implanted with Magna Ease Aortic valve size 21mm;  Surgeon: Elspeth JAYSON Millers, MD;  Location: [facility] OR;  Service: Open Heart Surgery;  Laterality: N/A;   AV FISTULA PLACEMENT Left 01/03/2017   Procedure: INSERTION OF ARTERIOVENOUS (AV) GORE-TEX GRAFT LEFT THIGH;  Surgeon: Serene Gaile ORN, MD;  Location: [facility] OR;  Service: Vascular;  Laterality: Left;   AVGG REMOVAL Right 02/17/2016   Procedure: REMOVAL OF TWO ARTERIOVENOUS GORETEX GRAFTS (AVGG);  Surgeon: Bruckner GORMAN Blade, MD;  Location: [facility] OR;  Service: Vascular;  Laterality: Right;   BIOPSY  05/16/2021   Procedure: BIOPSY;  Surgeon: Dianna Specking, MD;  Location: [facility];  Service: Endoscopy;;   BREAST BIOPSY Left 2013   stereo    BREAST BIOPSY Right 2011   stereo    CARDIAC VALVE REPLACEMENT     CHOLECYSTECTOMY N/A 01/30/2017   Procedure: LAPAROSCOPIC CHOLECYSTECTOMY;  Surgeon: Vanderbilt Ned, MD;  Location: [facility] OR;  Service: General;  Laterality: N/A;   COLONOSCOPY W/ POLYPECTOMY     COLONOSCOPY WITH PROPOFOL  N/A 05/16/2021   Procedure: COLONOSCOPY WITH PROPOFOL ;  Surgeon: Dianna Specking, MD;  Location: [facility];  Service: Endoscopy;  Laterality: N/A;   DG AV DIALYSIS GRAFT DECLOT OR     DILATATION & CURETTAGE/HYSTEROSCOPY WITH TRUECLEAR N/A 11/06/2012   Procedure: DILATATION & CURETTAGE/HYSTEROSCOPY WITH TRUECLEAR;  Surgeon: Curlee VEAR Guan, MD;  Location: [facility];  Service: Gynecology;  Laterality: N/A;  Truclear Resectoscopic Polypectomy    INSERTION OF DIALYSIS CATHETER N/A 02/19/2016   Procedure: INSERTION OF Left Internal Jugular DIALYSIS CATHETER;  Surgeon: Lonni GORMAN Blade, MD;  Location: [facility] OR;  Service: Vascular;  Laterality: N/A;   LOOP RECORDER INSERTION N/A 10/01/2016    Procedure: LOOP RECORDER INSERTION;  Surgeon: Inocencio Soyla Lunger, MD;  Location: [facility];  Service: Cardiovascular;  Laterality: N/A;   PATCH ANGIOPLASTY Right 02/17/2016   Procedure: PATCH ANGIOPLASTY;  Surgeon: Lonni GORMAN Blade, MD;  Location: [facility] OR;  Service: Vascular;  Laterality: Right;   PERIPHERAL VASCULAR CATHETERIZATION N/A 09/14/2014   Procedure: A/V Shuntogram/Fistulagram;  Surgeon: Cordella KANDICE Shawl, MD;  Location: [facility];  Service: Cardiovascular;  Laterality: N/A;   PERIPHERAL VASCULAR CATHETERIZATION N/A 09/14/2014   Procedure: A/V Shunt Intervention;  Surgeon: Cordella KANDICE Shawl, MD;  Location: [facility];  Service: Cardiovascular;  Laterality: N/A;   PERIPHERAL VASCULAR CATHETERIZATION Right 12/09/2014   Procedure: A/V Shuntogram/Fistulagram;  Surgeon: Selinda GORMAN Gu, MD;  Location: [facility];  Service: Cardiovascular;  Laterality: Right;   PERIPHERAL VASCULAR CATHETERIZATION N/A 12/09/2014   Procedure: A/V Shunt Intervention;  Surgeon: Selinda GORMAN Gu, MD;  Location: [facility];  Service: Cardiovascular;  Laterality: N/A;   PERIPHERAL VASCULAR CATHETERIZATION Right 05/24/2015   Procedure: A/V Shuntogram;  Surgeon: Gaile LELON New, MD;  Location: [facility];  Service: Cardiovascular;  Laterality: Right;   PERIPHERAL VASCULAR CATHETERIZATION Right 05/24/2015   Procedure: Peripheral Vascular Balloon Angioplasty;  Surgeon: Gaile LELON New, MD;  Location: [facility];  Service: Cardiovascular;  Laterality: Right;  right arm shunt   PERIPHERAL VASCULAR CATHETERIZATION N/A 06/13/2015   Procedure: A/V Shuntogram/Fistulagram;  Surgeon: Selinda GORMAN Gu, MD;  Location: [facility];  Service: Cardiovascular;  Laterality: N/A;   PERIPHERAL VASCULAR CATHETERIZATION N/A 06/13/2015   Procedure: A/V Shunt Intervention;  Surgeon: Selinda GORMAN Gu, MD;  Location: [facility];  Service: Cardiovascular;  Laterality: N/A;    TEE WITHOUT CARDIOVERSION N/A 02/22/2016   Procedure: TRANSESOPHAGEAL ECHOCARDIOGRAM (TEE);  Surgeon: Vinie JAYSON Maxcy, MD;  Location: [facility] ENDOSCOPY;  Service: Cardiovascular;  Laterality: N/A;   TEE WITHOUT CARDIOVERSION N/A 02/25/2016   Procedure: TRANSESOPHAGEAL ECHOCARDIOGRAM (TEE);  Surgeon: Elspeth JAYSON Millers, MD;  Location: [facility] OR;  Service: Open Heart Surgery;  Laterality: N/A;   TEE WITHOUT CARDIOVERSION N/A 10/01/2016   Procedure: TRANSESOPHAGEAL ECHOCARDIOGRAM (TEE);  Surgeon: Maxcy Vinie JAYSON, MD;  Location: [facility] ENDOSCOPY;  Service: Cardiovascular;  Laterality: N/A;   TUBAL LIGATION  1983   VENOUS ANGIOPLASTY  10/10/2023   Procedure: VENOUS ANGIOPLASTY;  Surgeon: Sheree Penne Lonni, MD;  Location: [facility];  Service: Cardiovascular;;  Venous Anastomosis   Patient Active Problem List   Diagnosis Date Noted   Gait difficulty 09/09/2023   Lumbar pain 09/09/2023   Presence of heart assist device (HCC) 09/04/2023   Seasonal allergic rhinitis due to pollen 05/30/2023   Sensorineural hearing loss (SNHL) of both ears 05/30/2023   S/P aortic valve replacement 05/30/2023   History of colonic polyps 05/16/2021   Endocarditis 07/30/2020   Hyperparathyroidism due to renal insufficiency (HCC) 07/30/2020   Tooth, broken 12/18/2018   Hypotension  10/03/2018   Diarrhea, unspecified 08/25/2018   Orthostatic hypotension 03/14/2018   S/P cholecystectomy 02/14/2017   Osteoporosis 12/11/2016   Cholelithiasis 11/07/2016   Cerebral embolism with transient ischemic attack (TIA)    Bilateral carpal tunnel syndrome 08/09/2016   AVD (aortic valve disease) 07/12/2016   Pulmonary HTN (HCC) 07/12/2016   Carpal tunnel syndrome 07/05/2016   Disorder of both mastoids 05/29/2016   CVA (cerebral vascular accident) (HCC) 05/16/2016   History of aortic valve replacement with bioprosthetic valve 05/08/2016   Bacteremia due to coagulase-negative Staphylococcus    History of endocarditis    Anemia     Complex endometrial hyperplasia with atypia 11/08/2015   Dependence on renal dialysis (HCC) 12/15/2014   Obesity 10/28/2014   Right carotid bruit 01/08/2013   Generalized headaches 01/08/2013   Chronic female pelvic pain 08/08/2012   Gout, unspecified 12/06/2008   Esophageal reflux 12/06/2008   Backache 12/06/2008   FIBROIDS, UTERUS 11/24/2008   Diverticulosis of large intestine without perforation or abscess without bleeding 03/22/2008   ESRD (end stage renal disease) on dialysis (HCC) 07/01/2006   Anemia in chronic kidney disease 12/18/2001    PCP: Petrina Pries, NP   REFERRING PROVIDER: Petrina Pries, NP   REFERRING DIAG: R26.9 (ICD-10-CM) - Gait difficulty M54.50 (ICD-10-CM) - Lumbar pain  THERAPY DIAG:  Other abnormalities of gait and mobility  Pain in thoracic spine  Rationale for Evaluation and Treatment: Rehabilitation  ONSET DATE: early July  SUBJECTIVE:   SUBJECTIVE STATEMENT: Pt states with the use of the lidocaine  patches her back pain is minimal.  Not doing much with HEP.    PERTINENT HISTORY: On dialysis,  ESRD, heart assistive device PAIN:  Are you having pain? Yes: NPRS scale: 2/10 Pain location: upper back, bilateral shoulders Pain description: dull Aggravating factors: motion, ambulation, transfers Relieving factors: nothing  PRECAUTIONS: See hx --Pt has port for dialysis at anterior L thigh  RED FLAGS: None   WEIGHT BEARING RESTRICTIONS: No  FALLS:  Has patient fallen in last 6 months? Yes. Number of falls 1  LIVING ENVIRONMENT: Lives with: lives with their family Lives in: House/apartment Stairs: No Has following equipment at home: Single point cane  OCCUPATION: not working  PLOF: Independent  PATIENT GOALS: Be steady with walking and less pain in upper back/shoulders  NEXT MD VISIT: 01/05/24 per chart  OBJECTIVE:  Note: Objective measures were completed at Evaluation unless otherwise noted.  DIAGNOSTIC FINDINGS: R Knee x rays  In April 2025  FINDINGS: Chondrocalcinosis identified. Small osteophytes seen of all 3 compartments. Subchondral cyst formation along the medial compartment along the tibial plateau. No fracture or dislocation. Osteopenia. No joint effusion lateral view. Vascular calcifications.   IMPRESSION: Tracking metal degenerative changes.  Chondrocalcinosis.  PATIENT SURVEYS:  PSFS: THE PATIENT SPECIFIC FUNCTIONAL SCALE  Place score of 0-10 (0 = unable to perform activity and 10 = able to perform activity at the same level as before injury or problem)  Activity Date: 09/26/23    Lifting 5 lbs 5    2.Carrying 5 lbs 3    3.Walking without cane 5    4. Making the bed 4    Total Score 4.25      Total Score = Sum of activity scores/number of activities  Minimally Detectable Change: 3 points (for single activity); 2 points (for average score)  [facility] (nd). The Patient Specific Functional Scale . Retrieved from [email or web address]   COGNITION: Overall cognitive status: Within functional limits for tasks assessed  SENSATION: Not tested  EDEMA: Not addressed  MUSCLE LENGTH: Hamstrings: Moderate to sever restriction hamstrings bilaterally   POSTURE: rounded shoulders and forward head  SHOULDER AROM:  Flexion L 70 degrees R 60 degrees, Abduction L 70 degrees R 90 degrees  SPINAL ROM : Restricted 50% or more throughout  LOWER EXTREMITY MMT:  MMT Right eval Left eval  Hip flexion 4- 4-  Hip extension 4- 4-  Hip abduction 4- 4-  Hip adduction 4 4  Hip internal rotation    Hip external rotation    Knee flexion    Knee extension 4 3+  Ankle dorsiflexion    Ankle plantarflexion    Ankle inversion    Ankle eversion     (Blank rows = not tested)  LOWER EXTREMITY SPECIAL TESTS:  No speciality tests for orthopedic issues completed.  FUNCTIONAL TESTS:  5 times sit to stand: 36 sec  GAIT: Distance walked:  40 feet with CGA with no AD during eval Assistive device utilized: Single point cane Level of assistance: Modified independence Comments: Gait pattern: Decreased heel strike , decreased push off, knees flexed in stride, shoulders rounded, increased sway                                                                                                                                 TREATMENT DATE:  10/22/23 Rec bike 5 min SAQ 2x10 1# ankle weight Hip adduction ball squeezes 10x10 sec Bridges 2x10 Trunk rotations 2x10 SAQ 10x each LE Supine shoulder flexion with dowel 10x2 Neuro Re ed Seated toe raises 2x10 Seated marching 2x10 Sit to stands 2x5   10/15/23 Ther Ex Bridges 2x10 SAQ 2x10 Hip adduction ball squeezes 10x10 sec Supine shoulder flexion with dowel 10x Trunk rotations 2x10 Neuro Re ed Seated toe raises 2x10 Seated marching 2x10  10/08/23 Ther Ex Bridges 2x10 SAQ 2x10 Hip adduction ball squeezes 10x10 sec Supine shoulder flexion with dowel 10x Neuro Re ed Seated toe raises 2x10 Seated marching 2x10 Mini squats holding onto counter 2x10 Side steps at counter 2x10 There Act Pulleys 3 min Wall ladder 2x each hand      09/26/23 Evaluation and instruction and 1 set run through of HEP.   PATIENT EDUCATION:  Education details: Pt given written paper. Person educated: Patient Education method: Explanation, Demonstration, Tactile cues, and Verbal cues Education comprehension: verbalized understanding and returned demonstration  HOME EXERCISE PROGRAM: Access Code: [email or web address] URL: [email or web address] Date: 09/26/2023 Prepared by: Burnard Meth  Exercises - Supine Bridge  - 2 x daily - 7 x weekly - 2 sets - 8 reps - Supine Hip Adduction Isometric with Ball  - 2 x daily - 7 x weekly - 2 sets - 8 reps - 3 hold - Supine Knee Extension Strengthening  - 2 x daily - 7 x weekly - 2 sets - 8 reps - 2 hold - Seated Shoulder Shrugs  - 2 x daily - 7 x  weekly - 2 sets - 10 reps - Seated Scapular Retraction  - 2 x daily - 7 x weekly - 2 sets - 10 reps  ASSESSMENT:  CLINICAL IMPRESSION: Pt needed VC for tke in stride while ambulating in clinic.  Slight improvement after cuing.   Eval-Patient is a 68 y.o. female who was seen today for physical therapy evaluation and treatment for back pain and gait abnormalities. She presents with decreased spinal motion, dec shoulder ROM, dec LE/UE strength, altered gait, dec endurance for ADLs/ambulation and pain.  She would benefit from skilled PT to address these deficits and improve LOF at home and with community activities.  OBJECTIVE IMPAIRMENTS: Abnormal gait, decreased activity tolerance, decreased balance, decreased coordination, decreased ROM, decreased strength, impaired flexibility, and pain.   ACTIVITY LIMITATIONS: carrying, lifting, standing, squatting, stairs, and transfers  PARTICIPATION LIMITATIONS: driving and community activity  PERSONAL FACTORS: 3+ comorbidities also affecting patient's functional outcome.   REHAB POTENTIAL: Fair See personal factors  CLINICAL DECISION MAKING: Stable/uncomplicated  EVALUATION COMPLEXITY: Moderate   GOALS: Goals reviewed with patient? Yes  SHORT TERM GOALS: Target date: 11/07/2023   Pt to be independent with HEP. Baseline: Goal status: INITIAL  2.  Decrease pain by 1 level. Baseline:5/10  Goal status: INITIAL  3.  Pt able to ambulate 100 feet with SPC and SBAx1. Baseline: 40-50 feet with assist Goal status: INITIAL  LONG TERM GOALS: Target date: 12/19/2023    Increase LE strength  to 4+ throughout LE to increase strength for ambulation. Baseline: 4- Goal status: INITIAL  2.  Increase shoulder AROM to at least 90 degrees flexion and abduction. Baseline: 70+ Goal status: INITIAL  3.  Pt able to ambulate 200 ft with SPC if needed to increase community ambulation. Baseline: 40 ft Goal status: INITIAL  4.  Improve sit to stand  score by 10+ seconds. Baseline:  Goal status: INITIAL  5.  Improve PSFS by 2 points to 6.25. Baseline: 4.25 Goal status: INITIAL  6.  Decrease max pain to 3/10 in upper back with shoulder activity. Baseline: 5/10 Goal status: INITIAL   PLAN:  PT FREQUENCY: 1-2x/week  PT DURATION: 12 weeks  PLANNED INTERVENTIONS: [id number]- PT Re-evaluation, [id number]-Therapeutic exercises, 97530- Therapeutic activity, [id number]- Neuromuscular re-education, 97535- Self Care, [id number]- Manual therapy, [id number]- Gait training, [phone]- Aquatic Therapy, [phone]- Vasopneumatic device, Patient/Family education, Balance training, Stair training, Joint mobilization, Cryotherapy, and Moist heat  PLAN FOR NEXT SESSION: Increase quad strengthening, balance as endurance allows.   Burnard CHRISTELLA Meth, PT 10/22/2023, 2:29 PM

## 2023-10-22 ENCOUNTER — Ambulatory Visit (INDEPENDENT_AMBULATORY_CARE_PROVIDER_SITE_OTHER)

## 2023-10-22 DIAGNOSIS — M546 Pain in thoracic spine: Secondary | ICD-10-CM | POA: Diagnosis not present

## 2023-10-22 DIAGNOSIS — R2689 Other abnormalities of gait and mobility: Secondary | ICD-10-CM | POA: Diagnosis not present

## 2023-10-23 DIAGNOSIS — N186 End stage renal disease: Secondary | ICD-10-CM | POA: Diagnosis not present

## 2023-10-23 DIAGNOSIS — D509 Iron deficiency anemia, unspecified: Secondary | ICD-10-CM | POA: Diagnosis not present

## 2023-10-23 DIAGNOSIS — D631 Anemia in chronic kidney disease: Secondary | ICD-10-CM | POA: Diagnosis not present

## 2023-10-23 DIAGNOSIS — Z992 Dependence on renal dialysis: Secondary | ICD-10-CM | POA: Diagnosis not present

## 2023-10-23 DIAGNOSIS — N2581 Secondary hyperparathyroidism of renal origin: Secondary | ICD-10-CM | POA: Diagnosis not present

## 2023-10-25 DIAGNOSIS — D509 Iron deficiency anemia, unspecified: Secondary | ICD-10-CM | POA: Diagnosis not present

## 2023-10-25 DIAGNOSIS — N2581 Secondary hyperparathyroidism of renal origin: Secondary | ICD-10-CM | POA: Diagnosis not present

## 2023-10-25 DIAGNOSIS — D631 Anemia in chronic kidney disease: Secondary | ICD-10-CM | POA: Diagnosis not present

## 2023-10-25 DIAGNOSIS — N186 End stage renal disease: Secondary | ICD-10-CM | POA: Diagnosis not present

## 2023-10-25 DIAGNOSIS — Z992 Dependence on renal dialysis: Secondary | ICD-10-CM | POA: Diagnosis not present

## 2023-10-25 NOTE — Therapy (Addendum)
 OUTPATIENT PHYSICAL THERAPY LOWER EXTREMITY TREATMENT/DC  Patient Name: [name] Marks MRN: [id number] DOB:1955-12-27, 68 y.o., female Today's Date: 10/29/2023  END OF SESSION:  PT End of Session - 10/29/23 1413     Visit Number 5    Number of Visits 17    Date for PT Re-Evaluation 11/21/23    Authorization Type Medicare [employer] senior    PT Start Time 1352    PT Stop Time 1430    PT Time Calculation (min) 38 min    Activity Tolerance Patient tolerated treatment well    Behavior During Therapy WFL for tasks assessed/performed            Past Medical History:  Diagnosis Date   Anemia    Aortic valve prosthesis present 02/25/2016   Arthritis    knees (01/03/2017)   AVD (aortic valve disease) 07/12/2016   Backache 12/06/2008   Bacteremia due to coagulase-negative Staphylococcus    Carpal tunnel syndrome    Cerebral embolism with transient ischemic attack (TIA)    Cholelithiases 01/28/2017   Chronic female pelvic pain 08/08/2012   Complication of anesthesia    01/01/17- 'a long time ago, difficulty breathimg, not sure if it was due to anesthesia or not.   CVA (cerebral vascular accident) (HCC) 05/16/2016   Dialysis patient (HCC)    End stage renal disease on dialysis (HCC)    MWF; [address] (01/03/2017)   Endocarditis    Esophageal reflux 12/06/2008   GERD (gastroesophageal reflux disease)    Gout    History of blood transfusion 2017   related to blood poison (01/03/2017)   Increased endometrial stripe thickness 11/03/2015   Neck pain    Osteoporosis 09/2016   T score -2.6   Pain and swelling of right upper extremity 12/20/2015   Pulmonary HTN (HCC) 07/12/2016   Renal dialysis device, implant, or graft complication 12/20/2015   Renal insufficiency    S/P cholecystectomy 02/14/2017   Septic shock (HCC)    Staphylococcus aureus bacteremia    TIA (transient ischemic attack)    several (01/03/2017)   Past Surgical History:  Procedure Laterality Date    A/V SHUNT INTERVENTION Left 10/10/2023   Procedure: A/V SHUNT INTERVENTION;  Surgeon: Sheree Penne Bruckner, MD;  Location: [facility];  Service: Cardiovascular;  Laterality: Left;   AORTIC VALVE REPLACEMENT N/A 02/25/2016   Procedure: AORTIC VALVE REPLACEMENT (AVR) implanted with Magna Ease Aortic valve size 21mm;  Surgeon: Elspeth JAYSON Millers, MD;  Location: [facility] OR;  Service: Open Heart Surgery;  Laterality: N/A;   AV FISTULA PLACEMENT Left 01/03/2017   Procedure: INSERTION OF ARTERIOVENOUS (AV) GORE-TEX GRAFT LEFT THIGH;  Surgeon: Serene Gaile ORN, MD;  Location: [facility] OR;  Service: Vascular;  Laterality: Left;   AVGG REMOVAL Right 02/17/2016   Procedure: REMOVAL OF TWO ARTERIOVENOUS GORETEX GRAFTS (AVGG);  Surgeon: Bruckner GORMAN Blade, MD;  Location[facility] OR;  Service: Vascular;  Laterality: Right;   BIOPSY  05/16/2021   Procedure: BIOPSY;  Surgeon: Dianna Specking, MD;  Location: [facility];  Service: Endoscopy;;   BREAST BIOPSY Left 2013   stereo    BREAST BIOPSY Right 2011   stereo    CARDIAC VALVE REPLACEMENT     CHOLECYSTECTOMY N/A 01/30/2017   Procedure: LAPAROSCOPIC CHOLECYSTECTOMY;  Surgeon: Vanderbilt Ned, MD;  Location: [facility] OR;  Service: General;  Laterality: N/A;   COLONOSCOPY W/ POLYPECTOMY     COLONOSCOPY WITH PROPOFOL  N/A 05/16/2021   Procedure: COLONOSCOPY WITH PROPOFOL ;  Surgeon: Dianna Specking, MD;  Location: [facility];  Service: Endoscopy;  Laterality: N/A;   DG AV DIALYSIS GRAFT DECLOT OR     DILATATION & CURETTAGE/HYSTEROSCOPY WITH TRUECLEAR N/A 11/06/2012   Procedure: DILATATION & CURETTAGE/HYSTEROSCOPY WITH TRUECLEAR;  Surgeon: Curlee VEAR Guan, MD;  Location: [facility];  Service: Gynecology;  Laterality: N/A;  Truclear Resectoscopic Polypectomy    INSERTION OF DIALYSIS CATHETER N/A 02/19/2016   Procedure: INSERTION OF Left Internal Jugular DIALYSIS CATHETER;  Surgeon: Lonni GORMAN Blade, MD;  Location: [facility] OR;  Service: Vascular;  Laterality: N/A;   LOOP  RECORDER INSERTION N/A 10/01/2016   Procedure: LOOP RECORDER INSERTION;  Surgeon: Inocencio Soyla Lunger, MD;  Location: [facility];  Service: Cardiovascular;  Laterality: N/A;   PATCH ANGIOPLASTY Right 02/17/2016   Procedure: PATCH ANGIOPLASTY;  Surgeon: Lonni GORMAN Blade, MD;  Location: [facility] OR;  Service: Vascular;  Laterality: Right;   PERIPHERAL VASCULAR CATHETERIZATION N/A 09/14/2014   Procedure: A/V Shuntogram/Fistulagram;  Surgeon: Cordella KANDICE Shawl, MD;  Location: [facility];  Service: Cardiovascular;  Laterality: N/A;   PERIPHERAL VASCULAR CATHETERIZATION N/A 09/14/2014   Procedure: A/V Shunt Intervention;  Surgeon: Cordella KANDICE Shawl, MD;  Location: [facility];  Service: Cardiovascular;  Laterality: N/A;   PERIPHERAL VASCULAR CATHETERIZATION Right 12/09/2014   Procedure: A/V Shuntogram/Fistulagram;  Surgeon: Selinda GORMAN Gu, MD;  Location: [facility];  Service: Cardiovascular;  Laterality: Right;   PERIPHERAL VASCULAR CATHETERIZATION N/A 12/09/2014   Procedure: A/V Shunt Intervention;  Surgeon: Selinda GORMAN Gu, MD;  Location: [facility];  Service: Cardiovascular;  Laterality: N/A;   PERIPHERAL VASCULAR CATHETERIZATION Right 05/24/2015   Procedure: A/V Shuntogram;  Surgeon: Gaile LELON New, MD;  Location: [facility];  Service: Cardiovascular;  Laterality: Right;   PERIPHERAL VASCULAR CATHETERIZATION Right 05/24/2015   Procedure: Peripheral Vascular Balloon Angioplasty;  Surgeon: Gaile LELON New, MD;  Location: [facility];  Service: Cardiovascular;  Laterality: Right;  right arm shunt   PERIPHERAL VASCULAR CATHETERIZATION N/A 06/13/2015   Procedure: A/V Shuntogram/Fistulagram;  Surgeon: Selinda GORMAN Gu, MD;  Location: [facility];  Service: Cardiovascular;  Laterality: N/A;   PERIPHERAL VASCULAR CATHETERIZATION N/A 06/13/2015   Procedure: A/V Shunt Intervention;  Surgeon: Selinda GORMAN Gu, MD;  Location: [facility];  Service:  Cardiovascular;  Laterality: N/A;   TEE WITHOUT CARDIOVERSION N/A 02/22/2016   Procedure: TRANSESOPHAGEAL ECHOCARDIOGRAM (TEE);  Surgeon: Vinie JAYSON Maxcy, MD;  Location: [facility] ENDOSCOPY;  Service: Cardiovascular;  Laterality: N/A;   TEE WITHOUT CARDIOVERSION N/A 02/25/2016   Procedure: TRANSESOPHAGEAL ECHOCARDIOGRAM (TEE);  Surgeon: Elspeth JAYSON Millers, MD;  Location: [facility] OR;  Service: Open Heart Surgery;  Laterality: N/A;   TEE WITHOUT CARDIOVERSION N/A 10/01/2016   Procedure: TRANSESOPHAGEAL ECHOCARDIOGRAM (TEE);  Surgeon: Maxcy Vinie JAYSON, MD;  Location: [facility] ENDOSCOPY;  Service: Cardiovascular;  Laterality: N/A;   TUBAL LIGATION  1983   VENOUS ANGIOPLASTY  10/10/2023   Procedure: VENOUS ANGIOPLASTY;  Surgeon: Sheree Penne Lonni, MD;  Location: [facility];  Service: Cardiovascular;;  Venous Anastomosis   Patient Active Problem List   Diagnosis Date Noted   Gait difficulty 09/09/2023   Lumbar pain 09/09/2023   Presence of heart assist device (HCC) 09/04/2023   Seasonal allergic rhinitis due to pollen 05/30/2023   Sensorineural hearing loss (SNHL) of both ears 05/30/2023   S/P aortic valve replacement 05/30/2023   History of colonic polyps 05/16/2021   Endocarditis 07/30/2020   Hyperparathyroidism due to renal insufficiency (HCC) 07/30/2020  Tooth, broken 12/18/2018   Hypotension 10/03/2018   Diarrhea, unspecified 08/25/2018   Orthostatic hypotension 03/14/2018   S/P cholecystectomy 02/14/2017   Osteoporosis 12/11/2016   Cholelithiasis 11/07/2016   Cerebral embolism with transient ischemic attack (TIA)    Bilateral carpal tunnel syndrome 08/09/2016   AVD (aortic valve disease) 07/12/2016   Pulmonary HTN (HCC) 07/12/2016   Carpal tunnel syndrome 07/05/2016   Disorder of both mastoids 05/29/2016   CVA (cerebral vascular accident) (HCC) 05/16/2016   History of aortic valve replacement with bioprosthetic valve 05/08/2016   Bacteremia due to coagulase-negative Staphylococcus     History of endocarditis    Anemia    Complex endometrial hyperplasia with atypia 11/08/2015   Dependence on renal dialysis (HCC) 12/15/2014   Obesity 10/28/2014   Right carotid bruit 01/08/2013   Generalized headaches 01/08/2013   Chronic female pelvic pain 08/08/2012   Gout, unspecified 12/06/2008   Esophageal reflux 12/06/2008   Backache 12/06/2008   FIBROIDS, UTERUS 11/24/2008   Diverticulosis of large intestine without perforation or abscess without bleeding 03/22/2008   ESRD (end stage renal disease) on dialysis (HCC) 07/01/2006   Anemia in chronic kidney disease 12/18/2001    PCP: Petrina Pries, NP   REFERRING PROVIDER: Petrina Pries, NP   REFERRING DIAG: R26.9 (ICD-10-CM) - Gait difficulty M54.50 (ICD-10-CM) - Lumbar pain  THERAPY DIAG:  Other abnormalities of gait and mobility  Pain in thoracic spine  Rationale for Evaluation and Treatment: Rehabilitation  ONSET DATE: early July  SUBJECTIVE:   SUBJECTIVE STATEMENT: Pt states  the LBP has been occurring less but uses the patches when it increases. PERTINENT HISTORY: On dialysis,  ESRD, heart assistive device PAIN:  Are you having pain? Yes: NPRS scale: 2/10 Pain location: upper back, bilateral shoulders Pain description: dull Aggravating factors: motion, ambulation, transfers Relieving factors: nothing  PRECAUTIONS: See hx --Pt has port for dialysis at anterior L thigh  RED FLAGS: None   WEIGHT BEARING RESTRICTIONS: No  FALLS:  Has patient fallen in last 6 months? Yes. Number of falls 1  LIVING ENVIRONMENT: Lives with: lives with their family Lives in: House/apartment Stairs: No Has following equipment at home: Single point cane  OCCUPATION: not working  PLOF: Independent  PATIENT GOALS: Be steady with walking and less pain in upper back/shoulders  NEXT MD VISIT: 01/05/24 per chart  OBJECTIVE:  Note: Objective measures were completed at Evaluation unless otherwise noted.  DIAGNOSTIC  FINDINGS: R Knee x rays In April 2025  FINDINGS: Chondrocalcinosis identified. Small osteophytes seen of all 3 compartments. Subchondral cyst formation along the medial compartment along the tibial plateau. No fracture or dislocation. Osteopenia. No joint effusion lateral view. Vascular calcifications.   IMPRESSION: Tracking metal degenerative changes.  Chondrocalcinosis.  PATIENT SURVEYS:  PSFS: THE PATIENT SPECIFIC FUNCTIONAL SCALE  Place score of 0-10 (0 = unable to perform activity and 10 = able to perform activity at the same level as before injury or problem)  Activity Date: 09/26/23    Lifting 5 lbs 5    2.Carrying 5 lbs 3    3.Walking without cane 5    4. Making the bed 4    Total Score 4.25      Total Score = Sum of activity scores/number of activities  Minimally Detectable Change: 3 points (for single activity); 2 points (for average score)  [facility] (nd). The Patient Specific Functional Scale . Retrieved from [email or web address]   COGNITION: Overall cognitive status: Within functional limits for tasks assessed  SENSATION: Not tested  EDEMA: Not addressed  MUSCLE LENGTH: Hamstrings: Moderate to sever restriction hamstrings bilaterally   POSTURE: rounded shoulders and forward head  SHOULDER AROM:  Flexion L 70 degrees R 60 degrees, Abduction L 70 degrees R 90 degrees  SPINAL ROM : Restricted 50% or more throughout  LOWER EXTREMITY MMT:  MMT Right eval Left eval  Hip flexion 4- 4-  Hip extension 4- 4-  Hip abduction 4- 4-  Hip adduction 4 4  Hip internal rotation    Hip external rotation    Knee flexion    Knee extension 4 3+  Ankle dorsiflexion    Ankle plantarflexion    Ankle inversion    Ankle eversion     (Blank rows = not tested)  LOWER EXTREMITY SPECIAL TESTS:  No speciality tests for orthopedic issues completed.  FUNCTIONAL TESTS:  5 times sit to stand: 36  sec  GAIT: Distance walked: 40 feet with CGA with no AD during eval Assistive device utilized: Single point cane Level of assistance: Modified independence Comments: Gait pattern: Decreased heel strike , decreased push off, knees flexed in stride, shoulders rounded, increased sway                                                                                                                                 TREATMENT DATE:  10/29/23 There Ex Sci Fit  UE and LE 2.5 each way SAQ 2x10 1# ankle weight Hip adduction ball squeezes 10x10 sec Bridges 2x10 Trunk rotations 2x10 Supine shoulder flexion with dowel 5x2 There Act for improved standing and squatting Seated toe raises 2x10 Seated marching 2x10 Sit to stands 2x5 holding 2# ball   10/22/23 Rec bike 5 min SAQ 2x10 1# ankle weight Hip adduction ball squeezes 10x10 sec Bridges 2x10 Trunk rotations 2x10 SAQ 10x each LE Supine shoulder flexion with dowel 10x2 Neuro Re ed Seated toe raises 2x10 Seated marching 2x10 Sit to stands 2x5   10/15/23 Ther Ex Bridges 2x10 SAQ 2x10 Hip adduction ball squeezes 10x10 sec Supine shoulder flexion with dowel 10x Trunk rotations 2x10 Neuro Re ed Seated toe raises 2x10 Seated marching 2x10  10/08/23 Ther Ex Bridges 2x10 SAQ 2x10 Hip adduction ball squeezes 10x10 sec Supine shoulder flexion with dowel 10x Neuro Re ed Seated toe raises 2x10 Seated marching 2x10 Mini squats holding onto counter 2x10 Side steps at counter 2x10 There Act Pulleys 3 min Wall ladder 2x each hand      09/26/23 Evaluation and instruction and 1 set run through of HEP.   PATIENT EDUCATION:  Education details: Pt given written paper. Person educated: Patient Education method: Explanation, Demonstration, Tactile cues, and Verbal cues Education comprehension: verbalized understanding and returned demonstration  HOME EXERCISE PROGRAM: Access Code: [email or web address] URL:  [email or web address] Date: 09/26/2023 Prepared by: Burnard Meth  Exercises - Supine Bridge  - 2 x daily - 7 x weekly - 2 sets - 8 reps - Supine  Hip Adduction Isometric with Ball  - 2 x daily - 7 x weekly - 2 sets - 8 reps - 3 hold - Supine Knee Extension Strengthening  - 2 x daily - 7 x weekly - 2 sets - 8 reps - 2 hold - Seated Shoulder Shrugs  - 2 x daily - 7 x weekly - 2 sets - 10 reps - Seated Scapular Retraction  - 2 x daily - 7 x weekly - 2 sets - 10 reps  ASSESSMENT:  CLINICAL IMPRESSION: Pt continues to have trouble with shoulder flexion past 90 degrees.  Exercise is [facility] and helps with motion and posture. Need VC for TKE with ambulating.  Eval-Patient is a 68 y.o. female who was seen today for physical therapy evaluation and treatment for back pain and gait abnormalities. She presents with decreased spinal motion, dec shoulder ROM, dec LE/UE strength, altered gait, dec endurance for ADLs/ambulation and pain.  She would benefit from skilled PT to address these deficits and improve LOF at home and with community activities.  OBJECTIVE IMPAIRMENTS: Abnormal gait, decreased activity tolerance, decreased balance, decreased coordination, decreased ROM, decreased strength, impaired flexibility, and pain.   ACTIVITY LIMITATIONS: carrying, lifting, standing, squatting, stairs, and transfers  PARTICIPATION LIMITATIONS: driving and community activity  PERSONAL FACTORS: 3+ comorbidities also affecting patient's functional outcome.   REHAB POTENTIAL: Fair See personal factors  CLINICAL DECISION MAKING: Stable/uncomplicated  EVALUATION COMPLEXITY: Moderate   GOALS: Goals reviewed with patient? Yes  SHORT TERM GOALS: Target date: 11/07/2023   Pt to be independent with HEP. Baseline: Goal status: INITIAL  2.  Decrease pain by 1 level. Baseline:5/10  Goal status: INITIAL  3.  Pt able to ambulate 100 feet with SPC and SBAx1. Baseline: 40-50 feet with assist Goal  status: INITIAL  LONG TERM GOALS: Target date: 12/19/2023    Increase LE strength  to 4+ throughout LE to increase strength for ambulation. Baseline: 4- Goal status: INITIAL  2.  Increase shoulder AROM to at least 90 degrees flexion and abduction. Baseline: 70+ Goal status: INITIAL  3.  Pt able to ambulate 200 ft with SPC if needed to increase community ambulation. Baseline: 40 ft Goal status: INITIAL  4.  Improve sit to stand score by 10+ seconds. Baseline:  Goal status: INITIAL  5.  Improve PSFS by 2 points to 6.25. Baseline: 4.25 Goal status: INITIAL  6.  Decrease max pain to 3/10 in upper back with shoulder activity. Baseline: 5/10 Goal status: INITIAL   PLAN:  PT FREQUENCY: 1-2x/week  PT DURATION: 12 weeks  PLANNED INTERVENTIONS: [id number]- PT Re-evaluation, 97110-Therapeutic exercises, 97530- Therapeutic activity, [id number]- Neuromuscular re-education, 97535- Self Care, [id number]- Manual therapy, [id number]- Gait training, [phone]- Aquatic Therapy, [phone]- Vasopneumatic device, Patient/Family education, Balance training, Stair training, Joint mobilization, Cryotherapy, and Moist heat  PLAN FOR NEXT SESSION: Increase quad strengthening, balance as endurance allows.  PHYSICAL THERAPY DISCHARGE SUMMARY  Visits from Start of Care: 5  Current functional level related to goals / functional outcomes: See above   Remaining deficits: See above   Education / Equipment: HEP   Patient agrees to discharge. Patient goals were partially met. Patient is being discharged due to not returning since the last visit.     Burnard CHRISTELLA Meth, PT 10/29/2023, 2:30 PM

## 2023-10-28 DIAGNOSIS — D631 Anemia in chronic kidney disease: Secondary | ICD-10-CM | POA: Diagnosis not present

## 2023-10-28 DIAGNOSIS — Z992 Dependence on renal dialysis: Secondary | ICD-10-CM | POA: Diagnosis not present

## 2023-10-28 DIAGNOSIS — E8770 Fluid overload, unspecified: Secondary | ICD-10-CM | POA: Diagnosis not present

## 2023-10-28 DIAGNOSIS — R519 Headache, unspecified: Secondary | ICD-10-CM | POA: Diagnosis not present

## 2023-10-28 DIAGNOSIS — N039 Chronic nephritic syndrome with unspecified morphologic changes: Secondary | ICD-10-CM | POA: Diagnosis not present

## 2023-10-28 DIAGNOSIS — D509 Iron deficiency anemia, unspecified: Secondary | ICD-10-CM | POA: Diagnosis not present

## 2023-10-28 DIAGNOSIS — M549 Dorsalgia, unspecified: Secondary | ICD-10-CM | POA: Diagnosis not present

## 2023-10-28 DIAGNOSIS — N2581 Secondary hyperparathyroidism of renal origin: Secondary | ICD-10-CM | POA: Diagnosis not present

## 2023-10-28 DIAGNOSIS — N186 End stage renal disease: Secondary | ICD-10-CM | POA: Diagnosis not present

## 2023-10-29 ENCOUNTER — Ambulatory Visit (INDEPENDENT_AMBULATORY_CARE_PROVIDER_SITE_OTHER)

## 2023-10-29 DIAGNOSIS — R2689 Other abnormalities of gait and mobility: Secondary | ICD-10-CM

## 2023-10-29 DIAGNOSIS — M546 Pain in thoracic spine: Secondary | ICD-10-CM | POA: Diagnosis not present

## 2023-10-30 DIAGNOSIS — N2581 Secondary hyperparathyroidism of renal origin: Secondary | ICD-10-CM | POA: Diagnosis not present

## 2023-10-30 DIAGNOSIS — Z992 Dependence on renal dialysis: Secondary | ICD-10-CM | POA: Diagnosis not present

## 2023-10-30 DIAGNOSIS — R519 Headache, unspecified: Secondary | ICD-10-CM | POA: Diagnosis not present

## 2023-10-30 DIAGNOSIS — D631 Anemia in chronic kidney disease: Secondary | ICD-10-CM | POA: Diagnosis not present

## 2023-10-30 DIAGNOSIS — D509 Iron deficiency anemia, unspecified: Secondary | ICD-10-CM | POA: Diagnosis not present

## 2023-10-30 DIAGNOSIS — N186 End stage renal disease: Secondary | ICD-10-CM | POA: Diagnosis not present

## 2023-10-31 DIAGNOSIS — D631 Anemia in chronic kidney disease: Secondary | ICD-10-CM | POA: Diagnosis not present

## 2023-10-31 DIAGNOSIS — N2581 Secondary hyperparathyroidism of renal origin: Secondary | ICD-10-CM | POA: Diagnosis not present

## 2023-10-31 DIAGNOSIS — N186 End stage renal disease: Secondary | ICD-10-CM | POA: Diagnosis not present

## 2023-10-31 DIAGNOSIS — R519 Headache, unspecified: Secondary | ICD-10-CM | POA: Diagnosis not present

## 2023-10-31 DIAGNOSIS — D509 Iron deficiency anemia, unspecified: Secondary | ICD-10-CM | POA: Diagnosis not present

## 2023-10-31 DIAGNOSIS — Z992 Dependence on renal dialysis: Secondary | ICD-10-CM | POA: Diagnosis not present

## 2023-11-01 DIAGNOSIS — R519 Headache, unspecified: Secondary | ICD-10-CM | POA: Diagnosis not present

## 2023-11-01 DIAGNOSIS — N2581 Secondary hyperparathyroidism of renal origin: Secondary | ICD-10-CM | POA: Diagnosis not present

## 2023-11-01 DIAGNOSIS — D631 Anemia in chronic kidney disease: Secondary | ICD-10-CM | POA: Diagnosis not present

## 2023-11-01 DIAGNOSIS — N186 End stage renal disease: Secondary | ICD-10-CM | POA: Diagnosis not present

## 2023-11-01 DIAGNOSIS — D509 Iron deficiency anemia, unspecified: Secondary | ICD-10-CM | POA: Diagnosis not present

## 2023-11-01 DIAGNOSIS — Z992 Dependence on renal dialysis: Secondary | ICD-10-CM | POA: Diagnosis not present

## 2023-11-03 NOTE — Therapy (Incomplete)
 OUTPATIENT PHYSICAL THERAPY LOWER EXTREMITY TREATMENT  Patient Name: [name] Marks MRN: [id number] DOB:Jun 27, 1955, 68 y.o., female Today's Date: 11/03/2023  END OF SESSION:***        Past Medical History:  Diagnosis Date   Anemia    Aortic valve prosthesis present 02/25/2016   Arthritis    knees (01/03/2017)   AVD (aortic valve disease) 07/12/2016   Backache 12/06/2008   Bacteremia due to coagulase-negative Staphylococcus    Carpal tunnel syndrome    Cerebral embolism with transient ischemic attack (TIA)    Cholelithiases 01/28/2017   Chronic female pelvic pain 08/08/2012   Complication of anesthesia    01/01/17- 'a long time ago, difficulty breathimg, not sure if it was due to anesthesia or not.   CVA (cerebral vascular accident) (HCC) 05/16/2016   Dialysis patient (HCC)    End stage renal disease on dialysis (HCC)    MWF; [address] (01/03/2017)   Endocarditis    Esophageal reflux 12/06/2008   GERD (gastroesophageal reflux disease)    Gout    History of blood transfusion 2017   related to blood poison (01/03/2017)   Increased endometrial stripe thickness 11/03/2015   Neck pain    Osteoporosis 09/2016   T score -2.6   Pain and swelling of right upper extremity 12/20/2015   Pulmonary HTN (HCC) 07/12/2016   Renal dialysis device, implant, or graft complication 12/20/2015   Renal insufficiency    S/P cholecystectomy 02/14/2017   Septic shock (HCC)    Staphylococcus aureus bacteremia    TIA (transient ischemic attack)    several (01/03/2017)   Past Surgical History:  Procedure Laterality Date   A/V SHUNT INTERVENTION Left 10/10/2023   Procedure: A/V SHUNT INTERVENTION;  Surgeon: Sheree Penne Bruckner, MD;  Location: [facility];  Service: Cardiovascular;  Laterality: Left;   AORTIC VALVE REPLACEMENT N/A 02/25/2016   Procedure: AORTIC VALVE REPLACEMENT (AVR) implanted with Magna Ease Aortic valve size 21mm;  Surgeon: Elspeth JAYSON Millers, MD;   Location: [facility] OR;  Service: Open Heart Surgery;  Laterality: N/A;   AV FISTULA PLACEMENT Left 01/03/2017   Procedure: INSERTION OF ARTERIOVENOUS (AV) GORE-TEX GRAFT LEFT THIGH;  Surgeon: Serene Gaile ORN, MD;  Location: [facility] OR;  Service: Vascular;  Laterality: Left;   AVGG REMOVAL Right 02/17/2016   Procedure: REMOVAL OF TWO ARTERIOVENOUS GORETEX GRAFTS (AVGG);  Surgeon: Bruckner GORMAN Blade, MD;  Location[facility] OR;  Service: Vascular;  Laterality: Right;   BIOPSY  05/16/2021   Procedure: BIOPSY;  Surgeon: Dianna Specking, MD;  Location: [facility];  Service: Endoscopy;;   BREAST BIOPSY Left 2013   stereo    BREAST BIOPSY Right 2011   stereo    CARDIAC VALVE REPLACEMENT     CHOLECYSTECTOMY N/A 01/30/2017   Procedure: LAPAROSCOPIC CHOLECYSTECTOMY;  Surgeon: Vanderbilt Ned, MD;  Location: [facility] OR;  Service: General;  Laterality: N/A;   COLONOSCOPY W/ POLYPECTOMY     COLONOSCOPY WITH PROPOFOL  N/A 05/16/2021   Procedure: COLONOSCOPY WITH PROPOFOL ;  Surgeon: Dianna Specking, MD;  Location: [facility];  Service: Endoscopy;  Laterality: N/A;   DG AV DIALYSIS GRAFT DECLOT OR     DILATATION & CURETTAGE/HYSTEROSCOPY WITH TRUECLEAR N/A 11/06/2012   Procedure: DILATATION & CURETTAGE/HYSTEROSCOPY WITH TRUECLEAR;  Surgeon: Curlee VEAR Guan, MD;  Location: [facility];  Service: Gynecology;  Laterality: N/A;  Truclear Resectoscopic Polypectomy    INSERTION OF DIALYSIS CATHETER N/A 02/19/2016   Procedure: INSERTION OF Left Internal Jugular DIALYSIS CATHETER;  Surgeon: Bruckner GORMAN Blade, MD;  Location:  [facility] OR;  Service: Vascular;  Laterality: N/A;   LOOP RECORDER INSERTION N/A 10/01/2016   Procedure: LOOP RECORDER INSERTION;  Surgeon: Inocencio Soyla Lunger, MD;  Location: [facility];  Service: Cardiovascular;  Laterality: N/A;   PATCH ANGIOPLASTY Right 02/17/2016   Procedure: PATCH ANGIOPLASTY;  Surgeon: Lonni GORMAN Blade, MD;  Location: [facility] OR;  Service: Vascular;  Laterality: Right;   PERIPHERAL  VASCULAR CATHETERIZATION N/A 09/14/2014   Procedure: A/V Shuntogram/Fistulagram;  Surgeon: Cordella KANDICE Shawl, MD;  Location: [facility];  Service: Cardiovascular;  Laterality: N/A;   PERIPHERAL VASCULAR CATHETERIZATION N/A 09/14/2014   Procedure: A/V Shunt Intervention;  Surgeon: Cordella KANDICE Shawl, MD;  Location: [facility];  Service: Cardiovascular;  Laterality: N/A;   PERIPHERAL VASCULAR CATHETERIZATION Right 12/09/2014   Procedure: A/V Shuntogram/Fistulagram;  Surgeon: Selinda GORMAN Gu, MD;  Location: [facility];  Service: Cardiovascular;  Laterality: Right;   PERIPHERAL VASCULAR CATHETERIZATION N/A 12/09/2014   Procedure: A/V Shunt Intervention;  Surgeon: Selinda GORMAN Gu, MD;  Location: [facility];  Service: Cardiovascular;  Laterality: N/A;   PERIPHERAL VASCULAR CATHETERIZATION Right 05/24/2015   Procedure: A/V Shuntogram;  Surgeon: Gaile LELON New, MD;  Location: [facility];  Service: Cardiovascular;  Laterality: Right;   PERIPHERAL VASCULAR CATHETERIZATION Right 05/24/2015   Procedure: Peripheral Vascular Balloon Angioplasty;  Surgeon: Gaile LELON New, MD;  Location: [facility];  Service: Cardiovascular;  Laterality: Right;  right arm shunt   PERIPHERAL VASCULAR CATHETERIZATION N/A 06/13/2015   Procedure: A/V Shuntogram/Fistulagram;  Surgeon: Selinda GORMAN Gu, MD;  Location: [facility];  Service: Cardiovascular;  Laterality: N/A;   PERIPHERAL VASCULAR CATHETERIZATION N/A 06/13/2015   Procedure: A/V Shunt Intervention;  Surgeon: Selinda GORMAN Gu, MD;  Location: [facility];  Service: Cardiovascular;  Laterality: N/A;   TEE WITHOUT CARDIOVERSION N/A 02/22/2016   Procedure: TRANSESOPHAGEAL ECHOCARDIOGRAM (TEE);  Surgeon: Vinie JAYSON Maxcy, MD;  Location: [facility] ENDOSCOPY;  Service: Cardiovascular;  Laterality: N/A;   TEE WITHOUT CARDIOVERSION N/A 02/25/2016   Procedure: TRANSESOPHAGEAL ECHOCARDIOGRAM (TEE);  Surgeon: Elspeth JAYSON Millers, MD;   Location: [facility] OR;  Service: Open Heart Surgery;  Laterality: N/A;   TEE WITHOUT CARDIOVERSION N/A 10/01/2016   Procedure: TRANSESOPHAGEAL ECHOCARDIOGRAM (TEE);  Surgeon: Maxcy Vinie JAYSON, MD;  Location: [facility] ENDOSCOPY;  Service: Cardiovascular;  Laterality: N/A;   TUBAL LIGATION  1983   VENOUS ANGIOPLASTY  10/10/2023   Procedure: VENOUS ANGIOPLASTY;  Surgeon: Sheree Penne Lonni, MD;  Location: [facility];  Service: Cardiovascular;;  Venous Anastomosis   Patient Active Problem List   Diagnosis Date Noted   Gait difficulty 09/09/2023   Lumbar pain 09/09/2023   Presence of heart assist device (HCC) 09/04/2023   Seasonal allergic rhinitis due to pollen 05/30/2023   Sensorineural hearing loss (SNHL) of both ears 05/30/2023   S/P aortic valve replacement 05/30/2023   History of colonic polyps 05/16/2021   Endocarditis 07/30/2020   Hyperparathyroidism due to renal insufficiency (HCC) 07/30/2020   Tooth, broken 12/18/2018   Hypotension 10/03/2018   Diarrhea, unspecified 08/25/2018   Orthostatic hypotension 03/14/2018   S/P cholecystectomy 02/14/2017   Osteoporosis 12/11/2016   Cholelithiasis 11/07/2016   Cerebral embolism with transient ischemic attack (TIA)    Bilateral carpal tunnel syndrome 08/09/2016   AVD (aortic valve disease) 07/12/2016   Pulmonary HTN (HCC) 07/12/2016   Carpal tunnel syndrome 07/05/2016   Disorder of both mastoids 05/29/2016   CVA (cerebral vascular accident) (HCC) 05/16/2016  History of aortic valve replacement with bioprosthetic valve 05/08/2016   Bacteremia due to coagulase-negative Staphylococcus    History of endocarditis    Anemia    Complex endometrial hyperplasia with atypia 11/08/2015   Dependence on renal dialysis (HCC) 12/15/2014   Obesity 10/28/2014   Right carotid bruit 01/08/2013   Generalized headaches 01/08/2013   Chronic female pelvic pain 08/08/2012   Gout, unspecified 12/06/2008   Esophageal reflux 12/06/2008   Backache 12/06/2008    FIBROIDS, UTERUS 11/24/2008   Diverticulosis of large intestine without perforation or abscess without bleeding 03/22/2008   ESRD (end stage renal disease) on dialysis (HCC) 07/01/2006   Anemia in chronic kidney disease 12/18/2001    PCP: Petrina Pries, NP   REFERRING PROVIDER: Petrina Pries, NP   REFERRING DIAG: R26.9 (ICD-10-CM) - Gait difficulty M54.50 (ICD-10-CM) - Lumbar pain  THERAPY DIAG:  No diagnosis found.  Rationale for Evaluation and Treatment: Rehabilitation  ONSET DATE: early July  SUBJECTIVE:   SUBJECTIVE STATEMENT: ***Pt states  the LBP has been occurring less but uses the patches when it increases. PERTINENT HISTORY: On dialysis,  ESRD, heart assistive device PAIN:  ***Are you having pain? Yes: NPRS scale: 2/10 Pain location: upper back, bilateral shoulders Pain description: dull Aggravating factors: motion, ambulation, transfers Relieving factors: nothing  PRECAUTIONS: See hx --Pt has port for dialysis at anterior L thigh  RED FLAGS: None   WEIGHT BEARING RESTRICTIONS: No  FALLS:  Has patient fallen in last 6 months? Yes. Number of falls 1  LIVING ENVIRONMENT: Lives with: lives with their family Lives in: House/apartment Stairs: No Has following equipment at home: Single point cane  OCCUPATION: not working  PLOF: Independent  PATIENT GOALS: Be steady with walking and less pain in upper back/shoulders  NEXT MD VISIT: 01/05/24 per chart  OBJECTIVE:  Note: Objective measures were completed at Evaluation unless otherwise noted.  DIAGNOSTIC FINDINGS: R Knee x rays In April 2025  FINDINGS: Chondrocalcinosis identified. Small osteophytes seen of all 3 compartments. Subchondral cyst formation along the medial compartment along the tibial plateau. No fracture or dislocation. Osteopenia. No joint effusion lateral view. Vascular calcifications.   IMPRESSION: Tracking metal degenerative changes.  Chondrocalcinosis.  PATIENT SURVEYS:  PSFS:  THE PATIENT SPECIFIC FUNCTIONAL SCALE  Place score of 0-10 (0 = unable to perform activity and 10 = able to perform activity at the same level as before injury or problem)  Activity Date: 09/26/23    Lifting 5 lbs 5    2.Carrying 5 lbs 3    3.Walking without cane 5    4. Making the bed 4    Total Score 4.25      Total Score = Sum of activity scores/number of activities  Minimally Detectable Change: 3 points (for single activity); 2 points (for average score)  [facility] (nd). The Patient Specific Functional Scale . Retrieved from [email or web address]   COGNITION: Overall cognitive status: Within functional limits for tasks assessed     SENSATION: Not tested  EDEMA: Not addressed  MUSCLE LENGTH: Hamstrings: Moderate to sever restriction hamstrings bilaterally   POSTURE: rounded shoulders and forward head  SHOULDER AROM:  Flexion L 70 degrees R 60 degrees, Abduction L 70 degrees R 90 degrees  SPINAL ROM : Restricted 50% or more throughout  LOWER EXTREMITY MMT:  MMT Right eval Left eval  Hip flexion 4- 4-  Hip extension 4- 4-  Hip abduction 4- 4-  Hip adduction 4 4  Hip internal rotation  Hip external rotation    Knee flexion    Knee extension 4 3+  Ankle dorsiflexion    Ankle plantarflexion    Ankle inversion    Ankle eversion     (Blank rows = not tested)  LOWER EXTREMITY SPECIAL TESTS:  No speciality tests for orthopedic issues completed.  FUNCTIONAL TESTS:  5 times sit to stand: 36 sec  GAIT: Distance walked: 40 feet with CGA with no AD during eval Assistive device utilized: Single point cane Level of assistance: Modified independence Comments: Gait pattern: Decreased heel strike , decreased push off, knees flexed in stride, shoulders rounded, increased sway                                                                                                                                  TREATMENT DATE:  11/05/23***     10/29/23 There Ex Sci Fit  UE and LE 2.5 each way SAQ 2x10 1# ankle weight Hip adduction ball squeezes 10x10 sec Bridges 2x10 Trunk rotations 2x10 Supine shoulder flexion with dowel 5x2 There Act for improved standing and squatting Seated toe raises 2x10 Seated marching 2x10 Sit to stands 2x5 holding 2# ball   10/22/23 Rec bike 5 min SAQ 2x10 1# ankle weight Hip adduction ball squeezes 10x10 sec Bridges 2x10 Trunk rotations 2x10 SAQ 10x each LE Supine shoulder flexion with dowel 10x2 Neuro Re ed Seated toe raises 2x10 Seated marching 2x10 Sit to stands 2x5   10/15/23 Ther Ex Bridges 2x10 SAQ 2x10 Hip adduction ball squeezes 10x10 sec Supine shoulder flexion with dowel 10x Trunk rotations 2x10 Neuro Re ed Seated toe raises 2x10 Seated marching 2x10  10/08/23 Ther Ex Bridges 2x10 SAQ 2x10 Hip adduction ball squeezes 10x10 sec Supine shoulder flexion with dowel 10x Neuro Re ed Seated toe raises 2x10 Seated marching 2x10 Mini squats holding onto counter 2x10 Side steps at counter 2x10 There Act Pulleys 3 min Wall ladder 2x each hand      09/26/23 Evaluation and instruction and 1 set run through of HEP.   PATIENT EDUCATION:  Education details: Pt given written paper. Person educated: Patient Education method: Explanation, Demonstration, Tactile cues, and Verbal cues Education comprehension: verbalized understanding and returned demonstration  HOME EXERCISE PROGRAM: Access Code: [email or web address] URL: [email or web address] Date: 09/26/2023 Prepared by: Burnard Meth  Exercises - Supine Bridge  - 2 x daily - 7 x weekly - 2 sets - 8 reps - Supine Hip Adduction Isometric with Ball  - 2 x daily - 7 x weekly - 2 sets - 8 reps - 3 hold - Supine Knee Extension Strengthening  - 2 x daily - 7 x weekly - 2 sets - 8 reps - 2 hold - Seated Shoulder Shrugs  - 2 x daily - 7 x weekly - 2 sets - 10 reps - Seated Scapular  Retraction  - 2 x daily - 7 x weekly - 2 sets - 10 reps  ASSESSMENT:  CLINICAL IMPRESSION: ***Pt continues to have trouble with shoulder flexion past 90 degrees.  Exercise is [facility] and helps with motion and posture. Need VC for TKE with ambulating.  Eval-Patient is a 68 y.o. female who was seen today for physical therapy evaluation and treatment for back pain and gait abnormalities. She presents with decreased spinal motion, dec shoulder ROM, dec LE/UE strength, altered gait, dec endurance for ADLs/ambulation and pain.  She would benefit from skilled PT to address these deficits and improve LOF at home and with community activities.  OBJECTIVE IMPAIRMENTS: Abnormal gait, decreased activity tolerance, decreased balance, decreased coordination, decreased ROM, decreased strength, impaired flexibility, and pain.   ACTIVITY LIMITATIONS: carrying, lifting, standing, squatting, stairs, and transfers  PARTICIPATION LIMITATIONS: driving and community activity  PERSONAL FACTORS: 3+ comorbidities also affecting patient's functional outcome.   REHAB POTENTIAL: Fair See personal factors  CLINICAL DECISION MAKING: Stable/uncomplicated  EVALUATION COMPLEXITY: Moderate   GOALS: Goals reviewed with patient? Yes  SHORT TERM GOALS: Target date: 11/07/2023***   Pt to be independent with HEP. Baseline: Goal status: INITIAL  2.  Decrease pain by 1 level. Baseline:5/10  Goal status: INITIAL  3.  Pt able to ambulate 100 feet with SPC and SBAx1. Baseline: 40-50 feet with assist Goal status: INITIAL  LONG TERM GOALS: Target date: 12/19/2023    Increase LE strength  to 4+ throughout LE to increase strength for ambulation. Baseline: 4- Goal status: INITIAL  2.  Increase shoulder AROM to at least 90 degrees flexion and abduction. Baseline: 70+ Goal status: INITIAL  3.  Pt able to ambulate 200 ft with SPC if needed to increase community ambulation. Baseline: 40 ft Goal status: INITIAL  4.   Improve sit to stand score by 10+ seconds. Baseline:  Goal status: INITIAL  5.  Improve PSFS by 2 points to 6.25. Baseline: 4.25 Goal status: INITIAL  6.  Decrease max pain to 3/10 in upper back with shoulder activity. Baseline: 5/10 Goal status: INITIAL   PLAN:  PT FREQUENCY: 1-2x/week  PT DURATION: 12 weeks  PLANNED INTERVENTIONS: [id number]- PT Re-evaluation, 97110-Therapeutic exercises, 97530- Therapeutic activity, [id number]- Neuromuscular re-education, 97535- Self Care, [id number]- Manual therapy, [id number]- Gait training, [phone]- Aquatic Therapy, [phone]- Vasopneumatic device, Patient/Family education, Balance training, Stair training, Joint mobilization, Cryotherapy, and Moist heat  PLAN FOR NEXT SESSION: ***Increase quad strengthening, balance as endurance allows.   Burnard CHRISTELLA Meth, PT 11/03/2023, 4:21 PM

## 2023-11-05 ENCOUNTER — Encounter

## 2023-11-05 DIAGNOSIS — D631 Anemia in chronic kidney disease: Secondary | ICD-10-CM | POA: Diagnosis not present

## 2023-11-05 DIAGNOSIS — Z992 Dependence on renal dialysis: Secondary | ICD-10-CM | POA: Diagnosis not present

## 2023-11-05 DIAGNOSIS — N186 End stage renal disease: Secondary | ICD-10-CM | POA: Diagnosis not present

## 2023-11-05 DIAGNOSIS — R519 Headache, unspecified: Secondary | ICD-10-CM | POA: Diagnosis not present

## 2023-11-05 DIAGNOSIS — N2581 Secondary hyperparathyroidism of renal origin: Secondary | ICD-10-CM | POA: Diagnosis not present

## 2023-11-05 DIAGNOSIS — D509 Iron deficiency anemia, unspecified: Secondary | ICD-10-CM | POA: Diagnosis not present

## 2023-11-06 DIAGNOSIS — D631 Anemia in chronic kidney disease: Secondary | ICD-10-CM | POA: Diagnosis not present

## 2023-11-06 DIAGNOSIS — N2581 Secondary hyperparathyroidism of renal origin: Secondary | ICD-10-CM | POA: Diagnosis not present

## 2023-11-06 DIAGNOSIS — Z992 Dependence on renal dialysis: Secondary | ICD-10-CM | POA: Diagnosis not present

## 2023-11-06 DIAGNOSIS — R519 Headache, unspecified: Secondary | ICD-10-CM | POA: Diagnosis not present

## 2023-11-06 DIAGNOSIS — D509 Iron deficiency anemia, unspecified: Secondary | ICD-10-CM | POA: Diagnosis not present

## 2023-11-06 DIAGNOSIS — N186 End stage renal disease: Secondary | ICD-10-CM | POA: Diagnosis not present

## 2023-11-08 DIAGNOSIS — N2581 Secondary hyperparathyroidism of renal origin: Secondary | ICD-10-CM | POA: Diagnosis not present

## 2023-11-08 DIAGNOSIS — Z992 Dependence on renal dialysis: Secondary | ICD-10-CM | POA: Diagnosis not present

## 2023-11-08 DIAGNOSIS — D631 Anemia in chronic kidney disease: Secondary | ICD-10-CM | POA: Diagnosis not present

## 2023-11-08 DIAGNOSIS — D509 Iron deficiency anemia, unspecified: Secondary | ICD-10-CM | POA: Diagnosis not present

## 2023-11-08 DIAGNOSIS — N186 End stage renal disease: Secondary | ICD-10-CM | POA: Diagnosis not present

## 2023-11-08 DIAGNOSIS — R519 Headache, unspecified: Secondary | ICD-10-CM | POA: Diagnosis not present

## 2023-11-11 ENCOUNTER — Encounter (HOSPITAL_COMMUNITY): Payer: Self-pay

## 2023-11-11 ENCOUNTER — Other Ambulatory Visit: Payer: Self-pay

## 2023-11-11 ENCOUNTER — Emergency Department (HOSPITAL_COMMUNITY)
Admission: EM | Admit: 2023-11-11 | Discharge: 2023-11-11 | Disposition: A | Discharge status: Home / Self Care | Attending: Emergency Medicine | Admitting: Emergency Medicine

## 2023-11-11 DIAGNOSIS — N186 End stage renal disease: Secondary | ICD-10-CM | POA: Diagnosis not present

## 2023-11-11 DIAGNOSIS — R519 Headache, unspecified: Secondary | ICD-10-CM | POA: Diagnosis not present

## 2023-11-11 DIAGNOSIS — N2581 Secondary hyperparathyroidism of renal origin: Secondary | ICD-10-CM | POA: Diagnosis not present

## 2023-11-11 DIAGNOSIS — M545 Low back pain, unspecified: Secondary | ICD-10-CM | POA: Diagnosis present

## 2023-11-11 DIAGNOSIS — S39012A Strain of muscle, fascia and tendon of lower back, initial encounter: Secondary | ICD-10-CM | POA: Insufficient documentation

## 2023-11-11 DIAGNOSIS — D631 Anemia in chronic kidney disease: Secondary | ICD-10-CM | POA: Diagnosis not present

## 2023-11-11 DIAGNOSIS — D509 Iron deficiency anemia, unspecified: Secondary | ICD-10-CM | POA: Diagnosis not present

## 2023-11-11 DIAGNOSIS — T148XXA Other injury of unspecified body region, initial encounter: Secondary | ICD-10-CM

## 2023-11-11 DIAGNOSIS — X58XXXA Exposure to other specified factors, initial encounter: Secondary | ICD-10-CM | POA: Diagnosis not present

## 2023-11-11 DIAGNOSIS — Z992 Dependence on renal dialysis: Secondary | ICD-10-CM | POA: Insufficient documentation

## 2023-11-11 DIAGNOSIS — Z7902 Long term (current) use of antithrombotics/antiplatelets: Secondary | ICD-10-CM | POA: Insufficient documentation

## 2023-11-11 DIAGNOSIS — Z8673 Personal history of transient ischemic attack (TIA), and cerebral infarction without residual deficits: Secondary | ICD-10-CM | POA: Diagnosis not present

## 2023-11-11 DIAGNOSIS — G8929 Other chronic pain: Secondary | ICD-10-CM

## 2023-11-11 MED ORDER — HYDROCODONE-ACETAMINOPHEN 5-325 MG PO TABS
2.0000 | ORAL_TABLET | Freq: Once | ORAL | Status: AC
  Administered 2023-11-11: 2 via ORAL
  Filled 2023-11-11: qty 2

## 2023-11-11 MED ORDER — HYDROCODONE-ACETAMINOPHEN 5-325 MG PO TABS: 1.0000 | ORAL_TABLET | Freq: Four times a day (QID) | ORAL | 0 refills | Status: DC | PRN

## 2023-11-11 NOTE — Discharge Instructions (Signed)
 SEEK IMMEDIATE MEDICAL ATTENTION IF: New numbness, tingling, weakness, or problem with the use of your arms or legs.  Severe back pain not relieved with medications.  Change in bowel or bladder control (if you lose control of stool or urine, or if you are unable to urinate) Increasing pain in any areas of the body (such as chest or abdominal pain).  Shortness of breath, dizziness or fainting.  Nausea (feeling sick to your stomach), vomiting, fever, or sweats.

## 2023-11-11 NOTE — ED Triage Notes (Signed)
 Pt presents to ED from home C/O chronic back pain. Family at bedside requesting pt get prescription for something stronger than tramadol .

## 2023-11-11 NOTE — ED Provider Notes (Signed)
 [place] EMERGENCY DEPARTMENT AT [facility] Provider Note   CSN: [id number] Arrival date & time: 11/11/23  1125     Patient presents with: Back Pain   [name] Marks is a 68 y.o. female.   The history is provided by the patient.   Patient with extensive history including CVA, chronic back pain, end-stage renal disease on dialysis Monday Wednesday Friday presents for exacerbation of her low back pain.  No recent falls or trauma.  No new leg weakness.  No fecal incontinence.  No previous back surgery Patient did sit for dialysis this morning which seemed to worsen her symptoms. Patient is unable to control her pain with tramadol  and she has no more meds at home She does not have urine output on a regular basis.   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   CVA (cerebral vascular accident) (HCC) 05/16/2016   Dialysis patient (HCC)    End stage renal disease on dialysis (HCC)    MWF; [address] (01/03/2017)   Endocarditis    Esophageal reflux 12/06/2008   GERD (gastroesophageal reflux disease)    Gout    History of blood transfusion 2017   related to blood poison (01/03/2017)   Increased endometrial stripe thickness 11/03/2015   Neck pain    Osteoporosis 09/2016   T score -2.6   Pain and swelling of right upper extremity 12/20/2015   Pulmonary HTN (HCC) 07/12/2016   Renal dialysis device, implant, or graft complication 12/20/2015   Renal insufficiency    S/P  cholecystectomy 02/14/2017   Septic shock (HCC)    Staphylococcus aureus bacteremia    TIA (transient ischemic attack)    several (01/03/2017)    Prior to Admission medications   Medication Sig Start Date End Date Taking? Authorizing Provider  HYDROcodone -acetaminophen  (NORCO/VICODIN) 5-325 MG tablet Take 1 tablet by mouth every 6 (six) hours as needed for severe pain (pain score 7-10). 11/11/23  Yes Midge Golas, MD  cinacalcet  (SENSIPAR ) 90 MG tablet Take 90 mg by mouth at bedtime. Patient not taking: Reported on 09/04/2023    [provider]  clopidogrel  (PLAVIX ) 75 MG tablet TAKE 1 TABLET BY MOUTH EVERY DAY 07/10/23   Conte, Tessa N, PA-C  diclofenac  Sodium (VOLTAREN ) 1 % GEL Apply 2 g topically 4 (four) times daily as needed. 10/13/23   Trine Raynell Moder, MD  famotidine  (PEPCID ) 20 MG tablet Take 20 mg by mouth daily after supper. Patient not taking: Reported on 09/04/2023    [provider]  fluticasone  (FLONASE ) 50 MCG/ACT nasal spray Place 2 sprays into both nostrils daily. Patient not taking: Reported on 09/04/2023 04/20/23   Teresa Almarie LABOR, PA-C  lidocaine  (LIDODERM ) 5 % Place 1 patch onto the skin daily. Remove & Discard patch within 12 hours or as directed by MD 10/13/23   Cardama, Raynell Moder, MD  Loratadine  5 MG TBDP Take 1 tablet (5 mg total) by mouth daily. Patient not taking: Reported on 09/04/2023 05/30/23  Petrina Pries, NP  methocarbamol  (ROBAXIN ) 500 MG tablet Take 1-2 tablets (500-1,000 mg total) by mouth every 8 (eight) hours as needed for muscle spasms. 10/13/23   Cardama, Raynell Moder, MD  midodrine  (PROAMATINE ) 10 MG tablet Take 10 mg by mouth See admin instructions. Take 1 tablet (10 mg) by mouth before dialysis & take 1 tablet (10 mg) while at dialysis on Mondays, Wednesdays & Fridays. 08/15/20   [provider]  sevelamer  carbonate (RENVELA ) 800 MG tablet Take 2,400 mg by mouth See admin instructions. Take 3 tablets (2400 mg) by mouth  after each meal and after each snack Patient not taking: Reported on 09/04/2023    [provider]    Allergies: Iodinated contrast media, Cefazolin , Diphenhydramine , and Naproxen  sodium    Review of Systems  Constitutional:  Negative for fever.  Cardiovascular:  Negative for chest pain.  Gastrointestinal:  Negative for abdominal pain.  Musculoskeletal:  Positive for back pain.  Neurological:  Negative for weakness.    Updated Vital Signs BP (!) 108/54 (BP Location: Left Arm)   Pulse 88   Temp 98 F (36.7 C) (Oral)   Resp 16   SpO2 99%   Physical Exam CONSTITUTIONAL: Chronically ill-appearing, lying facedown in the bed in no distress HEAD: Normocephalic/atraumatic ENMT: Mucous membranes moist NECK: supple no meningeal signs SPINE/BACK:entire spine nontender thoracic and lumbar paraspinal tenderness ABDOMEN: soft, nontender, no rebound or guarding GU:no cva tenderness NEURO: Awake/alert, equal motor 5/5 strength noted with the following: hip flexion/knee flexion/extension, foot dorsi/plantar flexion, great toe extension intact bilaterally, no sensory deficit in any dermatome. Pt is able to ambulate unassisted. EXTREMITIES: pulses normal, full ROM SKIN: warm, color normal  (all labs ordered are listed, but only abnormal results are displayed) Labs Reviewed - No data to display  EKG: None  Radiology: No results found.   Procedures   Medications Ordered in the ED  HYDROcodone -acetaminophen  (NORCO/VICODIN) 5-325 MG per tablet 2 tablet (2 tablets Oral Given 11/11/23 1252)    Clinical Course as of 11/11/23 1327  Mon Nov 11, 2023  1246 Patient presents for acute on chronic back pain, no recent falls or trauma.  No focal neurodeficits. Patient is at multiple imaging modalities in the past year. Including CT abdomen pelvis last year that did not reveal AAA Will treat pain and reassess, but anticipate discharge with close PCP follow-up for ongoing pain management  [DW]  1327 Patient feeling improved, up walking around with her cane.  She is safe for discharge  [DW]    Clinical Course User Index [DW] Midge Golas, MD                                 Medical Decision Making Risk Prescription drug management.   This patient presents to the ED for concern of low back pain, this involves an extensive number of treatment options, and is a complaint that carries with it a high risk of complications and morbidity.  The differential diagnosis includes but is not limited to muscle strain, compression fracture, myelopathy, discitis, osteomyelitis, pyelonephritis, AAA, radiculopathy  Comorbidities that complicate the patient evaluation: Patient's presentation is complicated by their history of end-stage renal disease  Social Determinants of Health: Patient's multiple ER visits  increases the complexity of managing their presentation  Additional history obtained: Additional history obtained from family Records reviewed outpatient records reviewed  Medicines ordered and prescription drug management: I ordered medication including Vicodin and  for pain Reevaluation of the patient after these medicines showed that the patient    improved  Test Considered: Spinal imaging considered, but patient without focal neurodeficits, likely acute on chronic pain, will defer MRI for now  Reevaluation: After the interventions noted above, I reevaluated the patient and found that they have :improved  Complexity of problems addressed: Patient's presentation is most consistent with  exacerbation of chronic illness  Disposition: After consideration of the diagnostic results and the patient's response to treatment,  I feel that the patent would benefit from discharge  .        Final diagnoses:  Muscle strain  Acute on chronic back pain    ED Discharge Orders          Ordered    HYDROcodone -acetaminophen  (NORCO/VICODIN) 5-325 MG tablet  Every 6 hours PRN         11/11/23 1238               Midge Golas, MD 11/11/23 1327

## 2023-11-15 DIAGNOSIS — N186 End stage renal disease: Secondary | ICD-10-CM | POA: Diagnosis not present

## 2023-11-15 DIAGNOSIS — N2581 Secondary hyperparathyroidism of renal origin: Secondary | ICD-10-CM | POA: Diagnosis not present

## 2023-11-15 DIAGNOSIS — D631 Anemia in chronic kidney disease: Secondary | ICD-10-CM | POA: Diagnosis not present

## 2023-11-15 DIAGNOSIS — Z992 Dependence on renal dialysis: Secondary | ICD-10-CM | POA: Diagnosis not present

## 2023-11-15 DIAGNOSIS — R519 Headache, unspecified: Secondary | ICD-10-CM | POA: Diagnosis not present

## 2023-11-15 DIAGNOSIS — D509 Iron deficiency anemia, unspecified: Secondary | ICD-10-CM | POA: Diagnosis not present

## 2023-11-18 DIAGNOSIS — D509 Iron deficiency anemia, unspecified: Secondary | ICD-10-CM | POA: Diagnosis not present

## 2023-11-18 DIAGNOSIS — N186 End stage renal disease: Secondary | ICD-10-CM | POA: Diagnosis not present

## 2023-11-18 DIAGNOSIS — N2581 Secondary hyperparathyroidism of renal origin: Secondary | ICD-10-CM | POA: Diagnosis not present

## 2023-11-18 DIAGNOSIS — R519 Headache, unspecified: Secondary | ICD-10-CM | POA: Diagnosis not present

## 2023-11-18 DIAGNOSIS — D631 Anemia in chronic kidney disease: Secondary | ICD-10-CM | POA: Diagnosis not present

## 2023-11-18 DIAGNOSIS — Z992 Dependence on renal dialysis: Secondary | ICD-10-CM | POA: Diagnosis not present

## 2023-11-20 DIAGNOSIS — D631 Anemia in chronic kidney disease: Secondary | ICD-10-CM | POA: Diagnosis not present

## 2023-11-20 DIAGNOSIS — D509 Iron deficiency anemia, unspecified: Secondary | ICD-10-CM | POA: Diagnosis not present

## 2023-11-20 DIAGNOSIS — R519 Headache, unspecified: Secondary | ICD-10-CM | POA: Diagnosis not present

## 2023-11-20 DIAGNOSIS — Z992 Dependence on renal dialysis: Secondary | ICD-10-CM | POA: Diagnosis not present

## 2023-11-20 DIAGNOSIS — N2581 Secondary hyperparathyroidism of renal origin: Secondary | ICD-10-CM | POA: Diagnosis not present

## 2023-11-20 DIAGNOSIS — N186 End stage renal disease: Secondary | ICD-10-CM | POA: Diagnosis not present

## 2023-11-22 DIAGNOSIS — D509 Iron deficiency anemia, unspecified: Secondary | ICD-10-CM | POA: Diagnosis not present

## 2023-11-22 DIAGNOSIS — N2581 Secondary hyperparathyroidism of renal origin: Secondary | ICD-10-CM | POA: Diagnosis not present

## 2023-11-22 DIAGNOSIS — Z992 Dependence on renal dialysis: Secondary | ICD-10-CM | POA: Diagnosis not present

## 2023-11-22 DIAGNOSIS — N186 End stage renal disease: Secondary | ICD-10-CM | POA: Diagnosis not present

## 2023-11-22 DIAGNOSIS — R519 Headache, unspecified: Secondary | ICD-10-CM | POA: Diagnosis not present

## 2023-11-22 DIAGNOSIS — D631 Anemia in chronic kidney disease: Secondary | ICD-10-CM | POA: Diagnosis not present

## 2023-11-25 DIAGNOSIS — N186 End stage renal disease: Secondary | ICD-10-CM | POA: Diagnosis not present

## 2023-11-25 DIAGNOSIS — R519 Headache, unspecified: Secondary | ICD-10-CM | POA: Diagnosis not present

## 2023-11-25 DIAGNOSIS — D509 Iron deficiency anemia, unspecified: Secondary | ICD-10-CM | POA: Diagnosis not present

## 2023-11-25 DIAGNOSIS — Z992 Dependence on renal dialysis: Secondary | ICD-10-CM | POA: Diagnosis not present

## 2023-11-25 DIAGNOSIS — D631 Anemia in chronic kidney disease: Secondary | ICD-10-CM | POA: Diagnosis not present

## 2023-11-25 DIAGNOSIS — N2581 Secondary hyperparathyroidism of renal origin: Secondary | ICD-10-CM | POA: Diagnosis not present

## 2023-11-27 DIAGNOSIS — Z992 Dependence on renal dialysis: Secondary | ICD-10-CM | POA: Diagnosis not present

## 2023-11-27 DIAGNOSIS — N186 End stage renal disease: Secondary | ICD-10-CM | POA: Diagnosis not present

## 2023-11-27 DIAGNOSIS — N039 Chronic nephritic syndrome with unspecified morphologic changes: Secondary | ICD-10-CM | POA: Diagnosis not present

## 2023-11-27 DIAGNOSIS — N2581 Secondary hyperparathyroidism of renal origin: Secondary | ICD-10-CM | POA: Diagnosis not present

## 2023-11-27 DIAGNOSIS — D509 Iron deficiency anemia, unspecified: Secondary | ICD-10-CM | POA: Diagnosis not present

## 2023-11-27 DIAGNOSIS — Z23 Encounter for immunization: Secondary | ICD-10-CM | POA: Diagnosis not present

## 2023-11-27 DIAGNOSIS — D631 Anemia in chronic kidney disease: Secondary | ICD-10-CM | POA: Diagnosis not present

## 2023-11-29 DIAGNOSIS — N186 End stage renal disease: Secondary | ICD-10-CM | POA: Diagnosis not present

## 2023-11-29 DIAGNOSIS — D509 Iron deficiency anemia, unspecified: Secondary | ICD-10-CM | POA: Diagnosis not present

## 2023-11-29 DIAGNOSIS — Z992 Dependence on renal dialysis: Secondary | ICD-10-CM | POA: Diagnosis not present

## 2023-11-29 DIAGNOSIS — Z23 Encounter for immunization: Secondary | ICD-10-CM | POA: Diagnosis not present

## 2023-11-29 DIAGNOSIS — N2581 Secondary hyperparathyroidism of renal origin: Secondary | ICD-10-CM | POA: Diagnosis not present

## 2023-11-29 DIAGNOSIS — D631 Anemia in chronic kidney disease: Secondary | ICD-10-CM | POA: Diagnosis not present

## 2023-12-02 DIAGNOSIS — D509 Iron deficiency anemia, unspecified: Secondary | ICD-10-CM | POA: Diagnosis not present

## 2023-12-02 DIAGNOSIS — Z992 Dependence on renal dialysis: Secondary | ICD-10-CM | POA: Diagnosis not present

## 2023-12-02 DIAGNOSIS — D631 Anemia in chronic kidney disease: Secondary | ICD-10-CM | POA: Diagnosis not present

## 2023-12-02 DIAGNOSIS — Z23 Encounter for immunization: Secondary | ICD-10-CM | POA: Diagnosis not present

## 2023-12-02 DIAGNOSIS — N186 End stage renal disease: Secondary | ICD-10-CM | POA: Diagnosis not present

## 2023-12-02 DIAGNOSIS — N2581 Secondary hyperparathyroidism of renal origin: Secondary | ICD-10-CM | POA: Diagnosis not present

## 2023-12-03 ENCOUNTER — Ambulatory Visit: Admitting: Podiatry

## 2023-12-03 DIAGNOSIS — B351 Tinea unguium: Secondary | ICD-10-CM

## 2023-12-03 DIAGNOSIS — M79672 Pain in left foot: Secondary | ICD-10-CM

## 2023-12-03 DIAGNOSIS — M79671 Pain in right foot: Secondary | ICD-10-CM | POA: Diagnosis not present

## 2023-12-03 NOTE — Progress Notes (Signed)
 Patient presents for evaluation and treatment of tenderness and some redness around nails feet.  Tenderness around toes with walking and wearing shoes.  Physical exam:  General appearance: Alert, pleasant, and in no acute distress.  Vascular: Pedal pulses: DP 2/4 B/L, PT 0/4 B/L. Mild edema lower legs bilaterally  Neu  Dermatologic:  Nails thickened, disfigured, discolored 1-5 BL with subungual debris.  Redness and hypertrophic nail folds along nail folds bilaterally but no signs of drainage or infection.  Musculoskeletal:     Diagnosis: 1. Painful onychomycotic nails 1 through 5 bilaterally. 2. Pain toes 1 through 5 bilaterally.  Plan: -Debrided onychomycotic nails 1 through 5 bilaterally.  Sharply debrided nails with nail clipper and reduced with a power bur.  Return 3 months RFC

## 2023-12-04 DIAGNOSIS — Z992 Dependence on renal dialysis: Secondary | ICD-10-CM | POA: Diagnosis not present

## 2023-12-04 DIAGNOSIS — N186 End stage renal disease: Secondary | ICD-10-CM | POA: Diagnosis not present

## 2023-12-04 DIAGNOSIS — D631 Anemia in chronic kidney disease: Secondary | ICD-10-CM | POA: Diagnosis not present

## 2023-12-04 DIAGNOSIS — D509 Iron deficiency anemia, unspecified: Secondary | ICD-10-CM | POA: Diagnosis not present

## 2023-12-04 DIAGNOSIS — N2581 Secondary hyperparathyroidism of renal origin: Secondary | ICD-10-CM | POA: Diagnosis not present

## 2023-12-04 DIAGNOSIS — Z23 Encounter for immunization: Secondary | ICD-10-CM | POA: Diagnosis not present

## 2023-12-06 DIAGNOSIS — D631 Anemia in chronic kidney disease: Secondary | ICD-10-CM | POA: Diagnosis not present

## 2023-12-06 DIAGNOSIS — N186 End stage renal disease: Secondary | ICD-10-CM | POA: Diagnosis not present

## 2023-12-06 DIAGNOSIS — D509 Iron deficiency anemia, unspecified: Secondary | ICD-10-CM | POA: Diagnosis not present

## 2023-12-06 DIAGNOSIS — Z23 Encounter for immunization: Secondary | ICD-10-CM | POA: Diagnosis not present

## 2023-12-06 DIAGNOSIS — N2581 Secondary hyperparathyroidism of renal origin: Secondary | ICD-10-CM | POA: Diagnosis not present

## 2023-12-06 DIAGNOSIS — Z992 Dependence on renal dialysis: Secondary | ICD-10-CM | POA: Diagnosis not present

## 2023-12-09 ENCOUNTER — Other Ambulatory Visit: Payer: Self-pay | Admitting: Family Medicine

## 2023-12-09 DIAGNOSIS — Z992 Dependence on renal dialysis: Secondary | ICD-10-CM | POA: Diagnosis not present

## 2023-12-09 DIAGNOSIS — M4307 Spondylolysis, lumbosacral region: Secondary | ICD-10-CM

## 2023-12-09 DIAGNOSIS — D509 Iron deficiency anemia, unspecified: Secondary | ICD-10-CM | POA: Diagnosis not present

## 2023-12-09 DIAGNOSIS — D631 Anemia in chronic kidney disease: Secondary | ICD-10-CM | POA: Diagnosis not present

## 2023-12-09 DIAGNOSIS — N186 End stage renal disease: Secondary | ICD-10-CM | POA: Diagnosis not present

## 2023-12-09 DIAGNOSIS — N2581 Secondary hyperparathyroidism of renal origin: Secondary | ICD-10-CM | POA: Diagnosis not present

## 2023-12-09 DIAGNOSIS — Z23 Encounter for immunization: Secondary | ICD-10-CM | POA: Diagnosis not present

## 2023-12-11 DIAGNOSIS — N2581 Secondary hyperparathyroidism of renal origin: Secondary | ICD-10-CM | POA: Diagnosis not present

## 2023-12-11 DIAGNOSIS — Z23 Encounter for immunization: Secondary | ICD-10-CM | POA: Diagnosis not present

## 2023-12-11 DIAGNOSIS — D631 Anemia in chronic kidney disease: Secondary | ICD-10-CM | POA: Diagnosis not present

## 2023-12-11 DIAGNOSIS — D509 Iron deficiency anemia, unspecified: Secondary | ICD-10-CM | POA: Diagnosis not present

## 2023-12-11 DIAGNOSIS — N186 End stage renal disease: Secondary | ICD-10-CM | POA: Diagnosis not present

## 2023-12-11 DIAGNOSIS — Z992 Dependence on renal dialysis: Secondary | ICD-10-CM | POA: Diagnosis not present

## 2023-12-13 DIAGNOSIS — N186 End stage renal disease: Secondary | ICD-10-CM | POA: Diagnosis not present

## 2023-12-13 DIAGNOSIS — Z23 Encounter for immunization: Secondary | ICD-10-CM | POA: Diagnosis not present

## 2023-12-13 DIAGNOSIS — N2581 Secondary hyperparathyroidism of renal origin: Secondary | ICD-10-CM | POA: Diagnosis not present

## 2023-12-13 DIAGNOSIS — D631 Anemia in chronic kidney disease: Secondary | ICD-10-CM | POA: Diagnosis not present

## 2023-12-13 DIAGNOSIS — Z992 Dependence on renal dialysis: Secondary | ICD-10-CM | POA: Diagnosis not present

## 2023-12-13 DIAGNOSIS — D509 Iron deficiency anemia, unspecified: Secondary | ICD-10-CM | POA: Diagnosis not present

## 2023-12-16 DIAGNOSIS — N186 End stage renal disease: Secondary | ICD-10-CM | POA: Diagnosis not present

## 2023-12-16 DIAGNOSIS — Z992 Dependence on renal dialysis: Secondary | ICD-10-CM | POA: Diagnosis not present

## 2023-12-16 DIAGNOSIS — D631 Anemia in chronic kidney disease: Secondary | ICD-10-CM | POA: Diagnosis not present

## 2023-12-16 DIAGNOSIS — D509 Iron deficiency anemia, unspecified: Secondary | ICD-10-CM | POA: Diagnosis not present

## 2023-12-16 DIAGNOSIS — N2581 Secondary hyperparathyroidism of renal origin: Secondary | ICD-10-CM | POA: Diagnosis not present

## 2023-12-16 DIAGNOSIS — Z23 Encounter for immunization: Secondary | ICD-10-CM | POA: Diagnosis not present

## 2023-12-18 DIAGNOSIS — D631 Anemia in chronic kidney disease: Secondary | ICD-10-CM | POA: Diagnosis not present

## 2023-12-18 DIAGNOSIS — Z992 Dependence on renal dialysis: Secondary | ICD-10-CM | POA: Diagnosis not present

## 2023-12-18 DIAGNOSIS — Z23 Encounter for immunization: Secondary | ICD-10-CM | POA: Diagnosis not present

## 2023-12-18 DIAGNOSIS — N186 End stage renal disease: Secondary | ICD-10-CM | POA: Diagnosis not present

## 2023-12-18 DIAGNOSIS — N2581 Secondary hyperparathyroidism of renal origin: Secondary | ICD-10-CM | POA: Diagnosis not present

## 2023-12-18 DIAGNOSIS — D509 Iron deficiency anemia, unspecified: Secondary | ICD-10-CM | POA: Diagnosis not present

## 2023-12-20 DIAGNOSIS — N186 End stage renal disease: Secondary | ICD-10-CM | POA: Diagnosis not present

## 2023-12-20 DIAGNOSIS — D509 Iron deficiency anemia, unspecified: Secondary | ICD-10-CM | POA: Diagnosis not present

## 2023-12-20 DIAGNOSIS — Z992 Dependence on renal dialysis: Secondary | ICD-10-CM | POA: Diagnosis not present

## 2023-12-20 DIAGNOSIS — N2581 Secondary hyperparathyroidism of renal origin: Secondary | ICD-10-CM | POA: Diagnosis not present

## 2023-12-20 DIAGNOSIS — Z23 Encounter for immunization: Secondary | ICD-10-CM | POA: Diagnosis not present

## 2023-12-20 DIAGNOSIS — D631 Anemia in chronic kidney disease: Secondary | ICD-10-CM | POA: Diagnosis not present

## 2023-12-23 DIAGNOSIS — Z23 Encounter for immunization: Secondary | ICD-10-CM | POA: Diagnosis not present

## 2023-12-23 DIAGNOSIS — D631 Anemia in chronic kidney disease: Secondary | ICD-10-CM | POA: Diagnosis not present

## 2023-12-23 DIAGNOSIS — N186 End stage renal disease: Secondary | ICD-10-CM | POA: Diagnosis not present

## 2023-12-23 DIAGNOSIS — Z992 Dependence on renal dialysis: Secondary | ICD-10-CM | POA: Diagnosis not present

## 2023-12-23 DIAGNOSIS — D509 Iron deficiency anemia, unspecified: Secondary | ICD-10-CM | POA: Diagnosis not present

## 2023-12-23 DIAGNOSIS — N2581 Secondary hyperparathyroidism of renal origin: Secondary | ICD-10-CM | POA: Diagnosis not present

## 2023-12-25 DIAGNOSIS — D509 Iron deficiency anemia, unspecified: Secondary | ICD-10-CM | POA: Diagnosis not present

## 2023-12-25 DIAGNOSIS — Z23 Encounter for immunization: Secondary | ICD-10-CM | POA: Diagnosis not present

## 2023-12-25 DIAGNOSIS — D631 Anemia in chronic kidney disease: Secondary | ICD-10-CM | POA: Diagnosis not present

## 2023-12-25 DIAGNOSIS — Z992 Dependence on renal dialysis: Secondary | ICD-10-CM | POA: Diagnosis not present

## 2023-12-25 DIAGNOSIS — N2581 Secondary hyperparathyroidism of renal origin: Secondary | ICD-10-CM | POA: Diagnosis not present

## 2023-12-25 DIAGNOSIS — N186 End stage renal disease: Secondary | ICD-10-CM | POA: Diagnosis not present

## 2023-12-26 DIAGNOSIS — H16143 Punctate keratitis, bilateral: Secondary | ICD-10-CM | POA: Diagnosis not present

## 2023-12-26 DIAGNOSIS — H04123 Dry eye syndrome of bilateral lacrimal glands: Secondary | ICD-10-CM | POA: Diagnosis not present

## 2023-12-26 DIAGNOSIS — H40022 Open angle with borderline findings, high risk, left eye: Secondary | ICD-10-CM | POA: Diagnosis not present

## 2023-12-27 DIAGNOSIS — N186 End stage renal disease: Secondary | ICD-10-CM | POA: Diagnosis not present

## 2023-12-27 DIAGNOSIS — N2581 Secondary hyperparathyroidism of renal origin: Secondary | ICD-10-CM | POA: Diagnosis not present

## 2023-12-27 DIAGNOSIS — Z992 Dependence on renal dialysis: Secondary | ICD-10-CM | POA: Diagnosis not present

## 2023-12-27 DIAGNOSIS — D509 Iron deficiency anemia, unspecified: Secondary | ICD-10-CM | POA: Diagnosis not present

## 2023-12-27 DIAGNOSIS — Z23 Encounter for immunization: Secondary | ICD-10-CM | POA: Diagnosis not present

## 2023-12-27 DIAGNOSIS — D631 Anemia in chronic kidney disease: Secondary | ICD-10-CM | POA: Diagnosis not present

## 2023-12-28 DIAGNOSIS — Z992 Dependence on renal dialysis: Secondary | ICD-10-CM | POA: Diagnosis not present

## 2023-12-28 DIAGNOSIS — N039 Chronic nephritic syndrome with unspecified morphologic changes: Secondary | ICD-10-CM | POA: Diagnosis not present

## 2023-12-28 DIAGNOSIS — N186 End stage renal disease: Secondary | ICD-10-CM | POA: Diagnosis not present

## 2023-12-30 DIAGNOSIS — Z992 Dependence on renal dialysis: Secondary | ICD-10-CM | POA: Diagnosis not present

## 2023-12-30 DIAGNOSIS — D509 Iron deficiency anemia, unspecified: Secondary | ICD-10-CM | POA: Diagnosis not present

## 2023-12-30 DIAGNOSIS — N186 End stage renal disease: Secondary | ICD-10-CM | POA: Diagnosis not present

## 2023-12-30 DIAGNOSIS — N2581 Secondary hyperparathyroidism of renal origin: Secondary | ICD-10-CM | POA: Diagnosis not present

## 2023-12-30 DIAGNOSIS — R519 Headache, unspecified: Secondary | ICD-10-CM | POA: Diagnosis not present

## 2023-12-30 DIAGNOSIS — D631 Anemia in chronic kidney disease: Secondary | ICD-10-CM | POA: Diagnosis not present

## 2024-01-02 DIAGNOSIS — R519 Headache, unspecified: Secondary | ICD-10-CM | POA: Diagnosis not present

## 2024-01-02 DIAGNOSIS — N2581 Secondary hyperparathyroidism of renal origin: Secondary | ICD-10-CM | POA: Diagnosis not present

## 2024-01-02 DIAGNOSIS — D509 Iron deficiency anemia, unspecified: Secondary | ICD-10-CM | POA: Diagnosis not present

## 2024-01-02 DIAGNOSIS — D631 Anemia in chronic kidney disease: Secondary | ICD-10-CM | POA: Diagnosis not present

## 2024-01-02 DIAGNOSIS — N186 End stage renal disease: Secondary | ICD-10-CM | POA: Diagnosis not present

## 2024-01-02 DIAGNOSIS — Z992 Dependence on renal dialysis: Secondary | ICD-10-CM | POA: Diagnosis not present

## 2024-01-03 ENCOUNTER — Other Ambulatory Visit: Payer: Self-pay | Admitting: Physician Assistant

## 2024-01-03 DIAGNOSIS — D509 Iron deficiency anemia, unspecified: Secondary | ICD-10-CM | POA: Diagnosis not present

## 2024-01-03 DIAGNOSIS — Z992 Dependence on renal dialysis: Secondary | ICD-10-CM | POA: Diagnosis not present

## 2024-01-03 DIAGNOSIS — D631 Anemia in chronic kidney disease: Secondary | ICD-10-CM | POA: Diagnosis not present

## 2024-01-03 DIAGNOSIS — N186 End stage renal disease: Secondary | ICD-10-CM | POA: Diagnosis not present

## 2024-01-03 DIAGNOSIS — N2581 Secondary hyperparathyroidism of renal origin: Secondary | ICD-10-CM | POA: Diagnosis not present

## 2024-01-03 DIAGNOSIS — R519 Headache, unspecified: Secondary | ICD-10-CM | POA: Diagnosis not present

## 2024-01-03 MED ORDER — CLOPIDOGREL BISULFATE 75 MG PO TABS
75.0000 mg | ORAL_TABLET | Freq: Every day | ORAL | 0 refills | Status: AC
Start: 2024-01-03 — End: ?

## 2024-01-06 DIAGNOSIS — Z992 Dependence on renal dialysis: Secondary | ICD-10-CM | POA: Diagnosis not present

## 2024-01-06 DIAGNOSIS — N2581 Secondary hyperparathyroidism of renal origin: Secondary | ICD-10-CM | POA: Diagnosis not present

## 2024-01-06 DIAGNOSIS — N186 End stage renal disease: Secondary | ICD-10-CM | POA: Diagnosis not present

## 2024-01-06 DIAGNOSIS — D509 Iron deficiency anemia, unspecified: Secondary | ICD-10-CM | POA: Diagnosis not present

## 2024-01-06 DIAGNOSIS — D631 Anemia in chronic kidney disease: Secondary | ICD-10-CM | POA: Diagnosis not present

## 2024-01-06 DIAGNOSIS — R519 Headache, unspecified: Secondary | ICD-10-CM | POA: Diagnosis not present

## 2024-01-08 DIAGNOSIS — D631 Anemia in chronic kidney disease: Secondary | ICD-10-CM | POA: Diagnosis not present

## 2024-01-08 DIAGNOSIS — R519 Headache, unspecified: Secondary | ICD-10-CM | POA: Diagnosis not present

## 2024-01-08 DIAGNOSIS — N186 End stage renal disease: Secondary | ICD-10-CM | POA: Diagnosis not present

## 2024-01-08 DIAGNOSIS — D509 Iron deficiency anemia, unspecified: Secondary | ICD-10-CM | POA: Diagnosis not present

## 2024-01-08 DIAGNOSIS — Z992 Dependence on renal dialysis: Secondary | ICD-10-CM | POA: Diagnosis not present

## 2024-01-08 DIAGNOSIS — N2581 Secondary hyperparathyroidism of renal origin: Secondary | ICD-10-CM | POA: Diagnosis not present

## 2024-01-09 ENCOUNTER — Encounter: Payer: Self-pay | Admitting: Family Medicine

## 2024-01-09 ENCOUNTER — Ambulatory Visit: Payer: Self-pay | Admitting: Family Medicine

## 2024-01-09 VITALS — BP 90/64 | HR 60 | Temp 98.2°F | Ht 59.0 in | Wt 136.0 lb

## 2024-01-09 DIAGNOSIS — Z95811 Presence of heart assist device: Secondary | ICD-10-CM

## 2024-01-09 DIAGNOSIS — R269 Unspecified abnormalities of gait and mobility: Secondary | ICD-10-CM | POA: Diagnosis not present

## 2024-01-09 DIAGNOSIS — Z1231 Encounter for screening mammogram for malignant neoplasm of breast: Secondary | ICD-10-CM

## 2024-01-09 DIAGNOSIS — Z2821 Immunization not carried out because of patient refusal: Secondary | ICD-10-CM

## 2024-01-09 DIAGNOSIS — Z992 Dependence on renal dialysis: Secondary | ICD-10-CM | POA: Diagnosis not present

## 2024-01-09 DIAGNOSIS — N186 End stage renal disease: Secondary | ICD-10-CM

## 2024-01-09 DIAGNOSIS — N2581 Secondary hyperparathyroidism of renal origin: Secondary | ICD-10-CM

## 2024-01-09 DIAGNOSIS — I9589 Other hypotension: Secondary | ICD-10-CM | POA: Diagnosis not present

## 2024-01-09 DIAGNOSIS — M4307 Spondylolysis, lumbosacral region: Secondary | ICD-10-CM | POA: Diagnosis not present

## 2024-01-09 DIAGNOSIS — H903 Sensorineural hearing loss, bilateral: Secondary | ICD-10-CM

## 2024-01-09 MED ORDER — LIDOCAINE 5 % EX PTCH: 1.0000 | MEDICATED_PATCH | CUTANEOUS | 0 refills | Status: AC

## 2024-01-09 MED ORDER — HYDROCODONE-ACETAMINOPHEN 5-325 MG PO TABS: 1.0000 | ORAL_TABLET | Freq: Three times a day (TID) | ORAL | 0 refills | Status: AC | PRN

## 2024-01-09 NOTE — Progress Notes (Signed)
 I,[name], CMA,acting as a neurosurgeon for [employer], NP.,have documented all relevant documentation on the behalf of [name], NP,as directed by  [name], NP while in the presence of [name], NP.  Subjective:  Patient ID: [name] Marks , female    DOB: [birth year] , 68 y.o.   MRN: [id number]  Chief Complaint  Patient presents with   Gait Abnormality    Patient presents today for a follow up.    Back Pain    Patient would like a refill on her hydrocodone , she reports it helps a lot with her pain.    HPI Discussed the use of AI scribe software for clinical note transcription with the patient, who gave verbal consent to proceed.  History of Present Illness      [name] Marks is a 68 year old female with chronic back pain and arthritis who presents for chronic disease management.  She experiences chronic back pain described as 'outrageous', causing her to pace the floor and preventing her from lying down. The pain occurs periodically, with episodes severe enough to disrupt her sleep. Tramadol  is no longer effective, and she uses hydrocodone  during severe episodes. She has been informed of a disintegrated disc and arthritis, but not osteoporosis. Physical therapy was attended twice a week and has concluded. She uses a back brace but is unsure of its fit. She has not consulted an orthopedic doctor.  She also experiences knee pain and has arthritis in her hands, described as 'Carpal Tunnel Syndrome'. She is not considering surgery for her hand pain.  She is on dialysis three times a week and has low blood pressure, for which she takes midodrine .  She reports dry eyes and irritation despite using prescribed eye drops and mentions feeling nodules in her eyes.  She has a history of a heart procedure about eight to nine years ago, though she does not have a pacemaker.     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   CVA (cerebral vascular accident) (HCC) 05/16/2016   Dialysis patient    End stage renal disease on dialysis (HCC)    MWF; [address] (01/03/2017)   Endocarditis    Esophageal reflux 12/06/2008   GERD (gastroesophageal reflux disease)    Gout    History of blood transfusion 2017   related to blood poison (01/03/2017)   Increased endometrial stripe thickness 11/03/2015   Neck pain    Osteoporosis 09/2016   T score -2.6   Pain and swelling of right upper extremity 12/20/2015   Pulmonary HTN (HCC) 07/12/2016   Renal dialysis device, implant, or graft complication 12/20/2015   Renal insufficiency    S/P cholecystectomy 02/14/2017   Septic shock (HCC)    Staphylococcus aureus bacteremia    TIA (transient ischemic attack)    several (01/03/2017)     Family History  Problem Relation Age of Onset   Diabetes Mother    Hypertension Mother    Heart  disease Mother    Alcohol abuse Mother    Kidney disease Mother    Cancer Father        COLON   Alcohol abuse Father    Breast cancer Sister 69   Hypertension Sister    Kidney disease Sister    Breast cancer Sister        breast cancer   Hypertension Son    Kidney disease Son      Current Outpatient Medications:    cinacalcet  (SENSIPAR ) 90 MG tablet, Take 90 mg by mouth at bedtime., Disp: , Rfl:    famotidine  (PEPCID ) 20 MG tablet, Take 20 mg by mouth daily after supper., Disp: , Rfl:    fluticasone  (FLONASE ) 50 MCG/ACT nasal spray, Place 2 sprays into both nostrils daily., Disp: 9.9 mL, Rfl:  2   HYDROcodone -acetaminophen  (NORCO/VICODIN) 5-325 MG tablet, Take 1 tablet by mouth every 8 (eight) hours as needed for moderate pain (pain score 4-6)., Disp: 20 tablet, Rfl: 0   methocarbamol  (ROBAXIN ) 500 MG tablet, Take 1-2 tablets (500-1,000 mg total) by mouth every 8 (eight) hours as needed for muscle spasms., Disp: 20 tablet, Rfl: 0   midodrine  (PROAMATINE ) 10 MG tablet, Take 10 mg by mouth See admin instructions. Take 1 tablet (10 mg) by mouth before dialysis & take 1 tablet (10 mg) while at dialysis on Mondays, Wednesdays & Fridays., Disp: , Rfl:    sevelamer  carbonate (RENVELA ) 800 MG tablet, Take 2,400 mg by mouth See admin instructions. Take 3 tablets (2400 mg) by mouth after each meal and after each snack, Disp: , Rfl:    clopidogrel  (PLAVIX ) 75 MG tablet, Take 1 tablet (75 mg total) by mouth daily. (Patient not taking: Reported on 01/09/2024), Disp: 30 tablet, Rfl: 0   diclofenac  Sodium (VOLTAREN ) 1 % GEL, Apply 2 g topically 4 (four) times daily as needed. (Patient not taking: Reported on 01/09/2024), Disp: 100 g, Rfl: 0   lidocaine  (LIDODERM ) 5 %, Place 1 patch onto the skin daily. Remove & Discard patch within 12 hours or as directed by MD, Disp: 30 patch, Rfl: 0   Loratadine  5 MG TBDP, Take 1 tablet (5 mg total) by mouth daily. (Patient not taking: Reported on 01/09/2024), Disp: 90 tablet, Rfl: 0   Allergies  Allergen Reactions   Iodinated Contrast Media Anaphylaxis and Hives   Cefazolin  Nausea And Vomiting   Diphenhydramine  Other (See Comments)   Naproxen  Sodium Itching     Review of Systems  Constitutional: Negative.   Respiratory: Negative.    Cardiovascular: Negative.   Musculoskeletal:  Positive for arthralgias, back pain and gait problem.  Skin: Negative.      Today's Vitals   01/09/24 1106  BP: 90/64  Pulse: 60  Temp: 98.2 F (36.8 C)  TempSrc: Oral  Weight: 136 lb (61.7 kg)  Height: 4' 11 (1.499 m)  PainSc: 5   PainLoc: Back   Body mass index is 27.47  kg/m.  Wt Readings from Last 3 Encounters:  01/09/24 136 lb (61.7 kg)  10/12/23 130 lb (59 kg)  09/06/23 135 lb (61.2 kg)    The ASCVD Risk score (Arnett DK, et al., 2019) failed to calculate for the following reasons:   Risk score cannot be calculated because patient has a medical history suggesting prior/existing ASCVD  Objective:  Physical Exam HENT:     Head: Normocephalic.  Cardiovascular:     Rate and Rhythm: Normal rate and regular rhythm.  Pulmonary:     Effort: Pulmonary  effort is normal.     Breath sounds: Normal breath sounds.  Neurological:     Mental Status: She is alert.         Assessment And Plan:   Assessment & Plan ESRD (end stage renal disease) on dialysis (HCC) -Undergoing dialysis three times a week.  -Dialysis affects medication metabolism, contributing to hypotension and pain management challenges. Gait difficulty Uses a walker Spondylolysis, lumbosacral region Chronic low back pain with muscle spasms, severe pain rated 5-6/10.  Previous treatments include tramadol  and hydrocodone .  Physical therapy completed, pain persists.  Other specified hypotension Hypotension with blood pressure at 90/60 mmHg. Midodrine  used to manage low blood pressure. Pain medication may further lower blood pressure. - Continue midodrine  to manage hypotension. - Monitor blood pressure regularly, especially when taking pain medication. Presence of heart assist device (HCC) Has AVR Pneumococcal vaccination declined  Herpes zoster vaccination declined  Screening mammogram for breast cancer Mammogram ordered  Orders Placed This Encounter  Procedures   MM 3D SCREENING MAMMOGRAM BILATERAL BREAST   AMB referral to orthopedics     I, [name], NP, have reviewed all documentation for this visit. The documentation on 01/20/2024 for the exam, diagnosis, procedures, and orders are all accurate and complete.   IF YOU HAVE BEEN REFERRED TO A SPECIALIST, IT MAY TAKE 1-2  WEEKS TO SCHEDULE/PROCESS THE REFERRAL. IF YOU HAVE NOT HEARD FROM US /SPECIALIST IN TWO WEEKS, PLEASE GIVE US  A CALL AT [phone].

## 2024-01-10 DIAGNOSIS — N186 End stage renal disease: Secondary | ICD-10-CM | POA: Diagnosis not present

## 2024-01-10 DIAGNOSIS — D631 Anemia in chronic kidney disease: Secondary | ICD-10-CM | POA: Diagnosis not present

## 2024-01-10 DIAGNOSIS — R519 Headache, unspecified: Secondary | ICD-10-CM | POA: Diagnosis not present

## 2024-01-10 DIAGNOSIS — Z992 Dependence on renal dialysis: Secondary | ICD-10-CM | POA: Diagnosis not present

## 2024-01-10 DIAGNOSIS — N2581 Secondary hyperparathyroidism of renal origin: Secondary | ICD-10-CM | POA: Diagnosis not present

## 2024-01-10 DIAGNOSIS — D509 Iron deficiency anemia, unspecified: Secondary | ICD-10-CM | POA: Diagnosis not present

## 2024-01-13 DIAGNOSIS — R519 Headache, unspecified: Secondary | ICD-10-CM | POA: Diagnosis not present

## 2024-01-13 DIAGNOSIS — N2581 Secondary hyperparathyroidism of renal origin: Secondary | ICD-10-CM | POA: Diagnosis not present

## 2024-01-13 DIAGNOSIS — D509 Iron deficiency anemia, unspecified: Secondary | ICD-10-CM | POA: Diagnosis not present

## 2024-01-13 DIAGNOSIS — D631 Anemia in chronic kidney disease: Secondary | ICD-10-CM | POA: Diagnosis not present

## 2024-01-13 DIAGNOSIS — Z992 Dependence on renal dialysis: Secondary | ICD-10-CM | POA: Diagnosis not present

## 2024-01-13 DIAGNOSIS — N186 End stage renal disease: Secondary | ICD-10-CM | POA: Diagnosis not present

## 2024-01-14 DIAGNOSIS — H16293 Other keratoconjunctivitis, bilateral: Secondary | ICD-10-CM | POA: Diagnosis not present

## 2024-01-14 DIAGNOSIS — H04123 Dry eye syndrome of bilateral lacrimal glands: Secondary | ICD-10-CM | POA: Diagnosis not present

## 2024-01-15 DIAGNOSIS — Z992 Dependence on renal dialysis: Secondary | ICD-10-CM | POA: Diagnosis not present

## 2024-01-15 DIAGNOSIS — N186 End stage renal disease: Secondary | ICD-10-CM | POA: Diagnosis not present

## 2024-01-15 DIAGNOSIS — R519 Headache, unspecified: Secondary | ICD-10-CM | POA: Diagnosis not present

## 2024-01-15 DIAGNOSIS — N2581 Secondary hyperparathyroidism of renal origin: Secondary | ICD-10-CM | POA: Diagnosis not present

## 2024-01-15 DIAGNOSIS — D509 Iron deficiency anemia, unspecified: Secondary | ICD-10-CM | POA: Diagnosis not present

## 2024-01-15 DIAGNOSIS — D631 Anemia in chronic kidney disease: Secondary | ICD-10-CM | POA: Diagnosis not present

## 2024-01-17 DIAGNOSIS — N2581 Secondary hyperparathyroidism of renal origin: Secondary | ICD-10-CM | POA: Diagnosis not present

## 2024-01-17 DIAGNOSIS — N186 End stage renal disease: Secondary | ICD-10-CM | POA: Diagnosis not present

## 2024-01-17 DIAGNOSIS — Z992 Dependence on renal dialysis: Secondary | ICD-10-CM | POA: Diagnosis not present

## 2024-01-17 DIAGNOSIS — R519 Headache, unspecified: Secondary | ICD-10-CM | POA: Diagnosis not present

## 2024-01-17 DIAGNOSIS — D509 Iron deficiency anemia, unspecified: Secondary | ICD-10-CM | POA: Diagnosis not present

## 2024-01-17 DIAGNOSIS — D631 Anemia in chronic kidney disease: Secondary | ICD-10-CM | POA: Diagnosis not present

## 2024-01-20 DIAGNOSIS — R519 Headache, unspecified: Secondary | ICD-10-CM | POA: Diagnosis not present

## 2024-01-20 DIAGNOSIS — N186 End stage renal disease: Secondary | ICD-10-CM | POA: Diagnosis not present

## 2024-01-20 DIAGNOSIS — Z992 Dependence on renal dialysis: Secondary | ICD-10-CM | POA: Diagnosis not present

## 2024-01-20 DIAGNOSIS — D631 Anemia in chronic kidney disease: Secondary | ICD-10-CM | POA: Diagnosis not present

## 2024-01-20 DIAGNOSIS — Z2821 Immunization not carried out because of patient refusal: Secondary | ICD-10-CM | POA: Insufficient documentation

## 2024-01-20 DIAGNOSIS — Z1231 Encounter for screening mammogram for malignant neoplasm of breast: Secondary | ICD-10-CM | POA: Insufficient documentation

## 2024-01-20 DIAGNOSIS — N2581 Secondary hyperparathyroidism of renal origin: Secondary | ICD-10-CM | POA: Diagnosis not present

## 2024-01-20 DIAGNOSIS — M4307 Spondylolysis, lumbosacral region: Secondary | ICD-10-CM | POA: Insufficient documentation

## 2024-01-20 DIAGNOSIS — D509 Iron deficiency anemia, unspecified: Secondary | ICD-10-CM | POA: Diagnosis not present

## 2024-01-20 NOTE — Assessment & Plan Note (Signed)
 Mammogram ordered

## 2024-01-20 NOTE — Assessment & Plan Note (Addendum)
-  Undergoing dialysis three times a week.  -Dialysis affects medication metabolism, contributing to hypotension and pain management challenges.

## 2024-01-20 NOTE — Assessment & Plan Note (Addendum)
 Chronic low back pain with muscle spasms, severe pain rated 5-6/10.  Previous treatments include tramadol  and hydrocodone .  Physical therapy completed, pain persists.

## 2024-01-20 NOTE — Assessment & Plan Note (Signed)
 Uses a walker.

## 2024-01-20 NOTE — Assessment & Plan Note (Signed)
 Has AVR

## 2024-01-20 NOTE — Assessment & Plan Note (Addendum)
 Hypotension with blood pressure at 90/60 mmHg. Midodrine  used to manage low blood pressure. Pain medication may further lower blood pressure. - Continue midodrine  to manage hypotension. - Monitor blood pressure regularly, especially when taking pain medication.

## 2024-01-21 DIAGNOSIS — D509 Iron deficiency anemia, unspecified: Secondary | ICD-10-CM | POA: Diagnosis not present

## 2024-01-21 DIAGNOSIS — Z992 Dependence on renal dialysis: Secondary | ICD-10-CM | POA: Diagnosis not present

## 2024-01-21 DIAGNOSIS — N2581 Secondary hyperparathyroidism of renal origin: Secondary | ICD-10-CM | POA: Diagnosis not present

## 2024-01-21 DIAGNOSIS — R519 Headache, unspecified: Secondary | ICD-10-CM | POA: Diagnosis not present

## 2024-01-21 DIAGNOSIS — N186 End stage renal disease: Secondary | ICD-10-CM | POA: Diagnosis not present

## 2024-01-21 DIAGNOSIS — D631 Anemia in chronic kidney disease: Secondary | ICD-10-CM | POA: Diagnosis not present

## 2024-01-24 DIAGNOSIS — N2581 Secondary hyperparathyroidism of renal origin: Secondary | ICD-10-CM | POA: Diagnosis not present

## 2024-01-24 DIAGNOSIS — Z992 Dependence on renal dialysis: Secondary | ICD-10-CM | POA: Diagnosis not present

## 2024-01-24 DIAGNOSIS — R519 Headache, unspecified: Secondary | ICD-10-CM | POA: Diagnosis not present

## 2024-01-24 DIAGNOSIS — D509 Iron deficiency anemia, unspecified: Secondary | ICD-10-CM | POA: Diagnosis not present

## 2024-01-24 DIAGNOSIS — N186 End stage renal disease: Secondary | ICD-10-CM | POA: Diagnosis not present

## 2024-01-24 DIAGNOSIS — D631 Anemia in chronic kidney disease: Secondary | ICD-10-CM | POA: Diagnosis not present

## 2024-02-04 ENCOUNTER — Other Ambulatory Visit: Payer: Self-pay | Admitting: Cardiology

## 2024-02-21 ENCOUNTER — Emergency Department (HOSPITAL_COMMUNITY)
Admission: EM | Admit: 2024-02-21 | Discharge: 2024-02-21 | Disposition: A | Discharge status: Home / Self Care | Attending: Emergency Medicine | Admitting: Emergency Medicine

## 2024-02-21 ENCOUNTER — Other Ambulatory Visit: Payer: Self-pay

## 2024-02-21 ENCOUNTER — Encounter (HOSPITAL_COMMUNITY): Payer: Self-pay

## 2024-02-21 DIAGNOSIS — L209 Atopic dermatitis, unspecified: Secondary | ICD-10-CM | POA: Diagnosis present

## 2024-02-21 MED ORDER — HYDROCORTISONE 1 % EX CREA: TOPICAL_CREAM | CUTANEOUS | 0 refills | Status: AC

## 2024-02-21 MED ORDER — SODIUM CHLORIDE 0.9 % IV BOLUS: 1000.0000 mL | Freq: Once | INTRAVENOUS | Status: DC

## 2024-02-21 NOTE — ED Triage Notes (Signed)
 Pt recently placed on medication for dry eyes, states she thinks the eye drops are causing her hands to burn and hurt. Pt states right eye is draining fluid. Dialysis pt MWF, went today.

## 2024-02-21 NOTE — ED Provider Notes (Signed)
 " [place] EMERGENCY DEPARTMENT AT [facility] Provider Note   CSN: [id number] Arrival date & time: 02/21/24  1609     Patient presents with: Hand Problem   [name] Marks is a 68 y.o. female.   This is a 68 year old female who is here today for 2 months of intermittent dry eyes, as well as intermittent burning in her hands.  She is here today with family at bedside.  They report that the patient was started on some drops for her dry eyes, she feels as though these have led to some of her issues.  She states that when she has some tears coming from her eyes and uses her hands to wipe them, it is painful on her hands.  She reportedly has been washing her hands significantly more.  She states that she has noticed increasing back pain around her eyes.  She reports that the skin around her eyes sometimes leaks fluid.  She denies fever or chills.  Patient is a dialysis patient, but has Monday Wednesday Friday dialysis, went to dialysis today.        Prior to Admission medications  Medication Sig Start Date End Date Taking? Authorizing Provider  hydrocortisone  cream 1 % Apply to affected area 2 times daily 02/21/24  Yes Mannie Pac T, DO  cinacalcet  (SENSIPAR ) 90 MG tablet Take 90 mg by mouth at bedtime.    [provider]  clopidogrel  (PLAVIX ) 75 MG tablet TAKE 1 TABLET BY MOUTH EVERY DAY 02/06/24   Shlomo Wilbert SAUNDERS, MD  diclofenac  Sodium (VOLTAREN ) 1 % GEL Apply 2 g topically 4 (four) times daily as needed. Patient not taking: Reported on 01/09/2024 10/13/23   Trine Raynell Moder, MD  famotidine  (PEPCID ) 20 MG tablet Take 20 mg by mouth daily after supper.    [provider]  fluticasone  (FLONASE ) 50 MCG/ACT nasal spray Place 2 sprays into both nostrils daily. 04/20/23   White, Elizabeth A, PA-C  HYDROcodone -acetaminophen  (NORCO/VICODIN) 5-325 MG tablet Take 1 tablet by mouth every 8 (eight) hours as needed for moderate pain (pain score 4-6). 01/09/24    Petrina Pries, NP  lidocaine  (LIDODERM ) 5 % Place 1 patch onto the skin daily. Remove & Discard patch within 12 hours or as directed by MD 01/09/24   Petrina Pries, NP  Loratadine  5 MG TBDP Take 1 tablet (5 mg total) by mouth daily. Patient not taking: Reported on 01/09/2024 05/30/23   Petrina Pries, NP  methocarbamol  (ROBAXIN ) 500 MG tablet Take 1-2 tablets (500-1,000 mg total) by mouth every 8 (eight) hours as needed for muscle spasms. 10/13/23   Trine Raynell Moder, MD  midodrine  (PROAMATINE ) 10 MG tablet Take 10 mg by mouth See admin instructions. Take 1 tablet (10 mg) by mouth before dialysis & take 1 tablet (10 mg) while at dialysis on Mondays, Wednesdays & Fridays. 08/15/20   [provider]  sevelamer  carbonate (RENVELA ) 800 MG tablet Take 2,400 mg by mouth See admin instructions. Take 3 tablets (2400 mg) by mouth after each meal and after each snack    [provider]    Allergies: Iodinated contrast media, Cefazolin , Diphenhydramine , and Naproxen  sodium    Review of Systems  Updated Vital Signs BP (!) 109/53 (BP Location: Left Arm)   Pulse 60   Temp 98.1 F (36.7 C) (Oral)   Resp 16   Ht 4' 11 (1.499 m)   Wt 60.8 kg   SpO2 100%   BMI 27.06 kg/m   Physical Exam  Vitals and nursing note reviewed.  Constitutional:      General: She is not in acute distress.    Appearance: She is not toxic-appearing.  HENT:     Head: Normocephalic and atraumatic.  Eyes:     General:        Right eye: No discharge.        Left eye: No discharge.     Extraocular Movements: Extraocular movements intact.     Conjunctiva/sclera: Conjunctivae normal.     Pupils: Pupils are equal, round, and reactive to light.     Comments: Vision grossly normal.  Extraocular muscles intact.  There is no conjunctival irritation, no drainage, patient denies pain.  Musculoskeletal:        General: No swelling, tenderness or deformity. Normal range of motion.  Skin:    Comments: Patient has mildly  dry, slightly cracked skin on her hands.  There is also some periorbital skin thickening without any skin breakdown, crepitus, cracks, warmth, erythema or swelling.  Neurological:     General: No focal deficit present.     Mental Status: She is alert.  Psychiatric:        Mood and Affect: Mood normal.     (all labs ordered are listed, but only abnormal results are displayed) Labs Reviewed - No data to display  EKG: None  Radiology: No results found.   Procedures   Medications Ordered in the ED - No data to display                                  Medical Decision Making 68 year old female here today for bilateral hand pain, intermittent eye discharge.  Plan- patient seems overall benign.  No.  She has some mildly dry skin on her hands.  He denies any overt signs of infection, no concerning rash, does not appear to be psoriasis, autoimmune rash.  Likely contact dermatitis versus atopic dermatitis.  Similar evaluation of the skin around her eyes.  There does not appear to be any issues with the eyes themselves.  I discussed this at length with the patient and the patient's family at bedside.    I explained how this appeared to be simple dermatitis, likely with benefit from routine skin care and PCP follow-up appointment.  I tried to explain this a few different ways but I think that there was still a bit of confusion regarding my recommendations.  Think is reasonable to send a low-dose steroid cream.  They will follow-up with her PCP.  This patient's health care is complicated by the following social determinants of health-lack of access to primary care.        Final diagnoses:  Atopic dermatitis, unspecified type    ED Discharge Orders          Ordered    hydrocortisone  cream 1 %        02/21/24 1652               Mannie Pac T, DO 02/21/24 1653  "

## 2024-02-21 NOTE — Discharge Instructions (Addendum)
 You have some dry and irritated skin.  I recommend that you use over-the-counter lotions such as Lubriderm or other nonscented lotions.  Have also sent you a low-dose steroid cream that you may use on your hands 1 time per day.  You may use the steroid cream around the skin in your eyes for up to 1 week.  Do not use this cream around your eyes for longer than 1 week.  Please follow-up with your primary care doctor to discuss these skin issues.

## 2024-02-25 ENCOUNTER — Ambulatory Visit: Admitting: Physical Medicine and Rehabilitation

## 2024-02-25 ENCOUNTER — Encounter: Payer: Self-pay | Admitting: Physical Medicine and Rehabilitation

## 2024-02-25 DIAGNOSIS — M5442 Lumbago with sciatica, left side: Secondary | ICD-10-CM

## 2024-02-25 DIAGNOSIS — M5416 Radiculopathy, lumbar region: Secondary | ICD-10-CM

## 2024-02-25 DIAGNOSIS — M48062 Spinal stenosis, lumbar region with neurogenic claudication: Secondary | ICD-10-CM | POA: Diagnosis not present

## 2024-02-25 DIAGNOSIS — M5441 Lumbago with sciatica, right side: Secondary | ICD-10-CM

## 2024-02-25 DIAGNOSIS — G8929 Other chronic pain: Secondary | ICD-10-CM | POA: Diagnosis not present

## 2024-02-25 NOTE — Progress Notes (Unsigned)
 Pain Scale   Average Pain 4 Patient advising she has chronic lower back pain the is constant.        +Driver, -BT, -Dye Allergies.

## 2024-02-25 NOTE — Progress Notes (Unsigned)
 Core Outcome Measures Index (COMI) Back Score  Average Pain 8  COMI Score 80 %

## 2024-02-25 NOTE — Progress Notes (Unsigned)
 "  [name] Marks - 68 y.o. female MRN [id number]  Date of birth: 1955/03/30  Office Visit Note: Visit Date: 02/25/2024 PCP: Petrina Pries, NP Referred by: Petrina Pries, NP  Subjective: Chief Complaint  Patient presents with   Lower Back - Pain   HPI: [name] Marks is a 68 y.o. female who comes in today per the request of Pries Petrina, NP for evaluation of chronic, worsening and severe bilateral lower back pain. Also reports pain distal to bilateral knees. Pain ongoing for several years, worsening with prolonged standing and walking. She describes pain as sharp and shooing sensation, currently rates as 8 out of 10. Some relief of pain with home exercise regimen, rest and use of medications. History of formal physical therapy with some relief of pain. She does take narcotic pain medications intermittently for back issues, these are prescribed by her PCP and emergency room providers. Recent lumbar radiographs shows unchanged grade 1 anterolisthesis of L4 with advanced disc space height loss and degenerative endplate changes at L4-L5. Prior lumbar MRI imaging from 2018 shows new severe spinal stenosis at L4-5 due to accelerated degenerative disc disease with advanced degenerative changes of the vertebral endplates as well as a broad-based disc protrusion and accelerated degenerative facet arthritis. Patient denies focal weakness, numbness and tingling. No recent trauma or falls. She is currently using cane to assist with ambulation.   Patients course is complicated by CVA, ESRD on dialysis, aortic valve replacement and osteoporosis.          Review of Systems  Musculoskeletal:  Positive for back pain.  Neurological:  Negative for tingling, sensory change, focal weakness and weakness.  All other systems reviewed and are negative.  Otherwise per HPI.  Assessment & Plan: Visit Diagnoses:    ICD-10-CM   1. Chronic bilateral low back pain with bilateral sciatica  M54.42 MR LUMBAR SPINE WO  CONTRAST   M54.41    G89.29     2. Lumbar radiculopathy  M54.16 MR LUMBAR SPINE WO CONTRAST    3. Spinal stenosis of lumbar region with neurogenic claudication  M48.062 MR LUMBAR SPINE WO CONTRAST       Plan: Findings:  Chronic, worsening and severe bilateral lower back pain. Also pain to bilateral lower extremities, distal to knees. Her pain has remained more intermittent over the years. Patient continues to have severe pain despite good conservative therapies such as formal physical therapy, home exercise regimen, rest and use of medications. Patients clinical presentation and exam are consistent with neurogenic claudication as a result of spinal canal stenosis. Prior lumbar MRI imaging from 2018 shows severe multi factorial spinal canal stenosis at the level of L4-L5. We discussed treatment plan in detail today. Next step is to place order for new lumbar MRI imaging. Depending on results of MRI imaging we discussed possibility of performing lumbar epidural steroid injection. Would also consider referral to our spine surgeon Dr. Ozell Ada for consultation. Patient is fairly adamant about avoiding surgery. Her exam today is non focal, good strength noted to bilateral lower extremities. No myelopathic symptoms noted. We will see her back for lumbar MRI review.     Meds & Orders: No orders of the defined types were placed in this encounter.   Orders Placed This Encounter  Procedures   MR LUMBAR SPINE WO CONTRAST    Follow-up: Return for Lumbar MRI review.   Procedures: No procedures performed      Clinical History: EXAM: LUMBAR SPINE - 2-3 VIEW; THORACIC SPINE  2 VIEWS   COMPARISON:  Chest radiograph 05/23/2023 and lumbar spine radiograph 04/04/2018   FINDINGS: No evidence of acute fracture or traumatic listhesis in the thoracolumbar spine mild spondylosis in the thoracic spine. Slight right convex lumbar curve. Unchanged grade 1 anterolisthesis of L4 with advanced disc space  height loss and degenerative endplate changes at L4-L5.   IMPRESSION: 1. No evidence of acute fracture or traumatic listhesis in the thoracolumbar spine. 2. Unchanged grade 1 anterolisthesis of L4 with advanced disc space height loss and degenerative endplate changes at L4-L5.     Electronically Signed   By: Norman Gatlin M.D.   On: 10/13/2023 00:37   She reports that she has never smoked. She has never used smokeless tobacco. No results for input(s): HGBA1C, LABURIC in the last 8760 hours.  Objective:  VS:  HT:    WT:   BMI:     BP:   HR: bpm  TEMP: ( )  RESP:  Physical Exam Vitals and nursing note reviewed.  HENT:     Head: Normocephalic and atraumatic.     Right Ear: External ear normal.     Left Ear: External ear normal.     Nose: Nose normal.     Mouth/Throat:     Mouth: Mucous membranes are moist.  Eyes:     Extraocular Movements: Extraocular movements intact.  Cardiovascular:     Rate and Rhythm: Normal rate.     Pulses: Normal pulses.  Pulmonary:     Effort: Pulmonary effort is normal.  Abdominal:     General: Abdomen is flat. There is no distension.  Musculoskeletal:        General: Tenderness present.     Cervical back: Normal range of motion.     Comments: Patient is slow to rise from seated position to standing. Good lumbar range of motion. No pain noted with facet loading. 5/5 strength noted with bilateral hip flexion, knee flexion/extension, ankle dorsiflexion/plantarflexion and EHL. No clonus noted bilaterally. No pain upon palpation of greater trochanters. No pain with internal/external rotation of bilateral hips. Sensation intact bilaterally. Negative slump test bilaterally. Ambulates with cane, gait slow and unsteady.    Skin:    General: Skin is warm and dry.     Capillary Refill: Capillary refill takes less than 2 seconds.  Neurological:     Mental Status: She is alert and oriented to person, place, and time.     Gait: Gait abnormal.   Psychiatric:        Mood and Affect: Mood normal.        Behavior: Behavior normal.     Ortho Exam  Imaging: No results found.  Past Medical/Family/Surgical/Social History: Medications & Allergies reviewed per EMR, new medications updated. Patient Active Problem List   Diagnosis Date Noted   Pneumococcal vaccination declined 01/20/2024   Herpes zoster vaccination declined 01/20/2024   Screening mammogram for breast cancer 01/20/2024   Spondylolysis, lumbosacral region 01/20/2024   Gait difficulty 09/09/2023   Lumbar pain 09/09/2023   Presence of heart assist device (HCC) 09/04/2023   Seasonal allergic rhinitis due to pollen 05/30/2023   Sensorineural hearing loss (SNHL) of both ears 05/30/2023   S/P aortic valve replacement 05/30/2023   History of colonic polyps 05/16/2021   Endocarditis 07/30/2020   Hyperparathyroidism due to renal insufficiency 07/30/2020   Tooth, broken 12/18/2018   Hypotension 10/03/2018   Diarrhea, unspecified 08/25/2018   Orthostatic hypotension 03/14/2018   S/P cholecystectomy 02/14/2017   Osteoporosis 12/11/2016  Cholelithiasis 11/07/2016   Cerebral embolism with transient ischemic attack (TIA)    Bilateral carpal tunnel syndrome 08/09/2016   AVD (aortic valve disease) 07/12/2016   Pulmonary HTN (HCC) 07/12/2016   Carpal tunnel syndrome 07/05/2016   Disorder of both mastoids 05/29/2016   CVA (cerebral vascular accident) (HCC) 05/16/2016   History of aortic valve replacement with bioprosthetic valve 05/08/2016   Bacteremia due to coagulase-negative Staphylococcus    History of endocarditis    Anemia    Complex endometrial hyperplasia with atypia 11/08/2015   Dependence on renal dialysis 12/15/2014   Obesity 10/28/2014   Right carotid bruit 01/08/2013   Generalized headaches 01/08/2013   Chronic female pelvic pain 08/08/2012   Gout, unspecified 12/06/2008   Esophageal reflux 12/06/2008   Backache 12/06/2008   FIBROIDS, UTERUS 11/24/2008    Diverticulosis of large intestine without perforation or abscess without bleeding 03/22/2008   ESRD (end stage renal disease) on dialysis (HCC) 07/01/2006   Anemia in chronic kidney disease 12/18/2001   Past Medical History:  Diagnosis Date   Anemia    Aortic valve prosthesis present 02/25/2016   Arthritis    knees (01/03/2017)   AVD (aortic valve disease) 07/12/2016   Backache 12/06/2008   Bacteremia due to coagulase-negative Staphylococcus    Carpal tunnel syndrome    Cerebral embolism with transient ischemic attack (TIA)    Cholelithiases 01/28/2017   Chronic female pelvic pain 08/08/2012   Complication of anesthesia    01/01/17- 'a long time ago, difficulty breathimg, not sure if it was due to anesthesia or not.   CVA (cerebral vascular accident) (HCC) 05/16/2016   Dialysis patient    End stage renal disease on dialysis (HCC)    MWF; [address] (01/03/2017)   Endocarditis    Esophageal reflux 12/06/2008   GERD (gastroesophageal reflux disease)    Gout    History of blood transfusion 2017   related to blood poison (01/03/2017)   Increased endometrial stripe thickness 11/03/2015   Neck pain    Osteoporosis 09/2016   T score -2.6   Pain and swelling of right upper extremity 12/20/2015   Pulmonary HTN (HCC) 07/12/2016   Renal dialysis device, implant, or graft complication 12/20/2015   Renal insufficiency    S/P cholecystectomy 02/14/2017   Septic shock (HCC)    Staphylococcus aureus bacteremia    TIA (transient ischemic attack)    several (01/03/2017)   Family History  Problem Relation Age of Onset   Diabetes Mother    Hypertension Mother    Heart disease Mother    Alcohol abuse Mother    Kidney disease Mother    Cancer Father        COLON   Alcohol abuse Father    Breast cancer Sister 85   Hypertension Sister    Kidney disease Sister    Breast cancer Sister        breast cancer   Hypertension Son    Kidney disease Son    Past Surgical  History:  Procedure Laterality Date   A/V SHUNT INTERVENTION Left 10/10/2023   Procedure: A/V SHUNT INTERVENTION;  Surgeon: Sheree Penne Bruckner, MD;  Location: [facility];  Service: Cardiovascular;  Laterality: Left;   AORTIC VALVE REPLACEMENT N/A 02/25/2016   Procedure: AORTIC VALVE REPLACEMENT (AVR) implanted with Magna Ease Aortic valve size 21mm;  Surgeon: Elspeth JAYSON Millers, MD;  Location: [facility] OR;  Service: Open Heart Surgery;  Laterality: N/A;   AV FISTULA PLACEMENT Left 01/03/2017   Procedure:  INSERTION OF ARTERIOVENOUS (AV) GORE-TEX GRAFT LEFT THIGH;  Surgeon: Serene Gaile ORN, MD;  Location: [facility] OR;  Service: Vascular;  Laterality: Left;   AVGG REMOVAL Right 02/17/2016   Procedure: REMOVAL OF TWO ARTERIOVENOUS GORETEX GRAFTS (AVGG);  Surgeon: Lonni GORMAN Blade, MD;  Location: [facility] OR;  Service: Vascular;  Laterality: Right;   BIOPSY  05/16/2021   Procedure: BIOPSY;  Surgeon: Dianna Specking, MD;  Location: [facility];  Service: Endoscopy;;   BREAST BIOPSY Left 2013   stereo    BREAST BIOPSY Right 2011   stereo    CARDIAC VALVE REPLACEMENT     CHOLECYSTECTOMY N/A 01/30/2017   Procedure: LAPAROSCOPIC CHOLECYSTECTOMY;  Surgeon: Vanderbilt Ned, MD;  Location: [facility] OR;  Service: General;  Laterality: N/A;   COLONOSCOPY W/ POLYPECTOMY     COLONOSCOPY WITH PROPOFOL  N/A 05/16/2021   Procedure: COLONOSCOPY WITH PROPOFOL ;  Surgeon: Dianna Specking, MD;  Location: [facility];  Service: Endoscopy;  Laterality: N/A;   DG AV DIALYSIS GRAFT DECLOT OR     DILATATION & CURETTAGE/HYSTEROSCOPY WITH TRUECLEAR N/A 11/06/2012   Procedure: DILATATION & CURETTAGE/HYSTEROSCOPY WITH TRUECLEAR;  Surgeon: Curlee VEAR Guan, MD;  Location: [facility];  Service: Gynecology;  Laterality: N/A;  Truclear Resectoscopic Polypectomy    INSERTION OF DIALYSIS CATHETER N/A 02/19/2016   Procedure: INSERTION OF Left Internal Jugular DIALYSIS CATHETER;  Surgeon: Lonni GORMAN Blade, MD;  Location: [facility] OR;  Service:  Vascular;  Laterality: N/A;   LOOP RECORDER INSERTION N/A 10/01/2016   Procedure: LOOP RECORDER INSERTION;  Surgeon: Inocencio Soyla Lunger, MD;  Location: [facility];  Service: Cardiovascular;  Laterality: N/A;   PATCH ANGIOPLASTY Right 02/17/2016   Procedure: PATCH ANGIOPLASTY;  Surgeon: Lonni GORMAN Blade, MD;  Location: [facility] OR;  Service: Vascular;  Laterality: Right;   PERIPHERAL VASCULAR CATHETERIZATION N/A 09/14/2014   Procedure: A/V Shuntogram/Fistulagram;  Surgeon: Cordella KANDICE Shawl, MD;  Location: [facility];  Service: Cardiovascular;  Laterality: N/A;   PERIPHERAL VASCULAR CATHETERIZATION N/A 09/14/2014   Procedure: A/V Shunt Intervention;  Surgeon: Cordella KANDICE Shawl, MD;  Location: [facility];  Service: Cardiovascular;  Laterality: N/A;   PERIPHERAL VASCULAR CATHETERIZATION Right 12/09/2014   Procedure: A/V Shuntogram/Fistulagram;  Surgeon: Selinda GORMAN Gu, MD;  Location: [facility];  Service: Cardiovascular;  Laterality: Right;   PERIPHERAL VASCULAR CATHETERIZATION N/A 12/09/2014   Procedure: A/V Shunt Intervention;  Surgeon: Selinda GORMAN Gu, MD;  Location: [facility];  Service: Cardiovascular;  Laterality: N/A;   PERIPHERAL VASCULAR CATHETERIZATION Right 05/24/2015   Procedure: A/V Shuntogram;  Surgeon: Gaile ORN Serene, MD;  Location: [facility];  Service: Cardiovascular;  Laterality: Right;   PERIPHERAL VASCULAR CATHETERIZATION Right 05/24/2015   Procedure: Peripheral Vascular Balloon Angioplasty;  Surgeon: Gaile ORN Serene, MD;  Location: [facility];  Service: Cardiovascular;  Laterality: Right;  right arm shunt   PERIPHERAL VASCULAR CATHETERIZATION N/A 06/13/2015   Procedure: A/V Shuntogram/Fistulagram;  Surgeon: Selinda GORMAN Gu, MD;  Location: [facility];  Service: Cardiovascular;  Laterality: N/A;   PERIPHERAL VASCULAR CATHETERIZATION N/A 06/13/2015   Procedure: A/V Shunt Intervention;  Surgeon: Selinda GORMAN Gu, MD;  Location:  [facility];  Service: Cardiovascular;  Laterality: N/A;   TEE WITHOUT CARDIOVERSION N/A 02/22/2016   Procedure: TRANSESOPHAGEAL ECHOCARDIOGRAM (TEE);  Surgeon: Vinie JAYSON Maxcy, MD;  Location: [facility] ENDOSCOPY;  Service: Cardiovascular;  Laterality: N/A;   TEE WITHOUT CARDIOVERSION N/A 02/25/2016   Procedure: TRANSESOPHAGEAL ECHOCARDIOGRAM (TEE);  Surgeon: Elspeth  JAYSON Millers, MD;  Location: [facility] OR;  Service: Open Heart Surgery;  Laterality: N/A;   TEE WITHOUT CARDIOVERSION N/A 10/01/2016   Procedure: TRANSESOPHAGEAL ECHOCARDIOGRAM (TEE);  Surgeon: Mona Vinie JAYSON, MD;  Location: [facility] ENDOSCOPY;  Service: Cardiovascular;  Laterality: N/A;   TUBAL LIGATION  1983   VENOUS ANGIOPLASTY  10/10/2023   Procedure: VENOUS ANGIOPLASTY;  Surgeon: Sheree Penne Bruckner, MD;  Location: [facility];  Service: Cardiovascular;;  Venous Anastomosis   Social History   Occupational History   Not on file  Tobacco Use   Smoking status: Never   Smokeless tobacco: Never  Vaping Use   Vaping status: Never Used  Substance and Sexual Activity   Alcohol use: No   Drug use: No   Sexual activity: Not Currently    Birth control/protection: Post-menopausal    Comment: 1st intercourse 68 yo-Fewer than 5 partners   "

## 2024-03-04 ENCOUNTER — Emergency Department (HOSPITAL_COMMUNITY)
Admission: EM | Admit: 2024-03-04 | Discharge: 2024-03-05 | Disposition: A | Discharge status: Home / Self Care | Attending: Emergency Medicine | Admitting: Emergency Medicine

## 2024-03-04 ENCOUNTER — Encounter (HOSPITAL_COMMUNITY): Payer: Self-pay

## 2024-03-04 ENCOUNTER — Emergency Department (HOSPITAL_COMMUNITY)

## 2024-03-04 DIAGNOSIS — D72819 Decreased white blood cell count, unspecified: Secondary | ICD-10-CM | POA: Insufficient documentation

## 2024-03-04 DIAGNOSIS — N186 End stage renal disease: Secondary | ICD-10-CM | POA: Insufficient documentation

## 2024-03-04 DIAGNOSIS — D649 Anemia, unspecified: Secondary | ICD-10-CM | POA: Insufficient documentation

## 2024-03-04 DIAGNOSIS — Z992 Dependence on renal dialysis: Secondary | ICD-10-CM | POA: Insufficient documentation

## 2024-03-04 DIAGNOSIS — D696 Thrombocytopenia, unspecified: Secondary | ICD-10-CM | POA: Diagnosis not present

## 2024-03-04 DIAGNOSIS — R531 Weakness: Secondary | ICD-10-CM

## 2024-03-04 DIAGNOSIS — R6883 Chills (without fever): Secondary | ICD-10-CM | POA: Diagnosis present

## 2024-03-04 DIAGNOSIS — R748 Abnormal levels of other serum enzymes: Secondary | ICD-10-CM | POA: Diagnosis not present

## 2024-03-04 DIAGNOSIS — D631 Anemia in chronic kidney disease: Secondary | ICD-10-CM | POA: Insufficient documentation

## 2024-03-04 NOTE — ED Provider Notes (Signed)
 " [place] EMERGENCY DEPARTMENT AT [facility] Provider Note   CSN: [id number] Arrival date & time: 03/04/24  1724     Patient presents with: Eye Problem and Fatigue   [name] Marks is a 69 y.o. female.    Eye Problem  She has a history of stroke, end-stage renal disease on hemodialysis and comes in because of gradually progressive weakness over the last 2-3 months.  She states that she has difficulty raising her arms and difficulty raising her legs.  Over the same time, she has been seeing an ophthalmologist about dry eyes and has noted she has developed very thick, dry skin on her hands.  She denies fever but does endorse chills and sweats.  She denies cough, nausea, vomiting, diarrhea.  She does not make urine.  Of note, she is a very vague historian.  Family member it has come in and concurs that the main complaints are difficulty controlling her dry eyes and generalized weakness.    Prior to Admission medications  Medication Sig Start Date End Date Taking? Authorizing Provider  cinacalcet  (SENSIPAR ) 90 MG tablet Take 90 mg by mouth at bedtime.    [provider]  clopidogrel  (PLAVIX ) 75 MG tablet TAKE 1 TABLET BY MOUTH EVERY DAY 02/06/24   Shlomo Wilbert SAUNDERS, MD  diclofenac  Sodium (VOLTAREN ) 1 % GEL Apply 2 g topically 4 (four) times daily as needed. Patient not taking: Reported on 01/09/2024 10/13/23   Trine Raynell Moder, MD  famotidine  (PEPCID ) 20 MG tablet Take 20 mg by mouth daily after supper.    [provider]  fluticasone  (FLONASE ) 50 MCG/ACT nasal spray Place 2 sprays into both nostrils daily. 04/20/23   Teresa Almarie LABOR, PA-C  HYDROcodone -acetaminophen  (NORCO/VICODIN) 5-325 MG tablet Take 1 tablet by mouth every 8 (eight) hours as needed for moderate pain (pain score 4-6). 01/09/24   Petrina Pries, NP  hydrocortisone  cream 1 % Apply to affected area 2 times daily 02/21/24   Mannie Pac T, DO  lidocaine  (LIDODERM ) 5 % Place 1 patch onto  the skin daily. Remove & Discard patch within 12 hours or as directed by MD 01/09/24   Petrina Pries, NP  Loratadine  5 MG TBDP Take 1 tablet (5 mg total) by mouth daily. Patient not taking: Reported on 01/09/2024 05/30/23   Petrina Pries, NP  methocarbamol  (ROBAXIN ) 500 MG tablet Take 1-2 tablets (500-1,000 mg total) by mouth every 8 (eight) hours as needed for muscle spasms. 10/13/23   Trine Raynell Moder, MD  midodrine  (PROAMATINE ) 10 MG tablet Take 10 mg by mouth See admin instructions. Take 1 tablet (10 mg) by mouth before dialysis & take 1 tablet (10 mg) while at dialysis on Mondays, Wednesdays & Fridays. 08/15/20   [provider]  sevelamer  carbonate (RENVELA ) 800 MG tablet Take 2,400 mg by mouth See admin instructions. Take 3 tablets (2400 mg) by mouth after each meal and after each snack    [provider]    Allergies: Iodinated contrast media, Cefazolin , Diphenhydramine , and Naproxen  sodium    Review of Systems  All other systems reviewed and are negative.   Updated Vital Signs BP 129/82 (BP Location: Left Arm)   Pulse 60   Temp (!) 97.5 F (36.4 C) (Oral)   Resp 15   Ht 4' 11 (1.499 m)   Wt 57.5 kg   SpO2 95%   BMI 25.61 kg/m   Physical Exam Vitals and nursing note reviewed.   69 year old female, resting  comfortably and in no acute distress. Vital signs are significant for borderline low blood pressure and slightly slow heart rate. Oxygen  saturation is 100%, which is normal. Head is normocephalic and atraumatic. PERRLA, EOMI.  Neck is nontender and supple without adenopathy. Lungs are clear without rales, wheezes, or rhonchi. Chest is nontender. Heart has regular rate and rhythm without murmur. Abdomen is soft, flat, nontender. Extremities: AV fistula is present in the left with thrill present. Skin is warm and dry.  Skin on the hands is generally thickened. Neurologic: Awake and alert, moves all extremities equally.  (all labs ordered are listed,  but only abnormal results are displayed) Labs Reviewed  CBC WITH DIFFERENTIAL/PLATELET - Abnormal; Notable for the following components:      Result Value   WBC 3.1 (*)    RBC 3.63 (*)    Hemoglobin 11.3 (*)    HCT 35.2 (*)    RDW 16.3 (*)    Platelets 90 (*)    Neutro Abs 1.5 (*)    All other components within normal limits  COMPREHENSIVE METABOLIC PANEL WITH GFR - Abnormal; Notable for the following components:   BUN 7 (*)    Creatinine, Ser 3.72 (*)    Calcium 10.5 (*)    Alkaline Phosphatase 142 (*)    GFR, Estimated 13 (*)    All other components within normal limits  SEDIMENTATION RATE    Radiology: [facility] Chest Port 1 View Result Date: 03/05/2024 EXAM: 1 VIEW XRAY OF THE CHEST 03/05/2024 12:05:48 AM COMPARISON: 05/23/2023 CLINICAL HISTORY: weakness FINDINGS: LINES, TUBES AND DEVICES: Right axillary vascular graft in place. Left axillary vascular graft in place. Loop recorder. LUNGS AND PLEURA: Shallow inspiration. No focal pulmonary opacity. No pleural effusion. No pneumothorax. HEART AND MEDIASTINUM: Mild cardiomegaly. Tortuous aorta with aortic atherosclerosis. Atypical cardiac configuration is likely congenital or postoperative. No change since prior study. BONES AND SOFT TISSUES: Sternotomy wires noted. Multilevel thoracic osteophytosis. Degenerative changes in the shoulders. IMPRESSION: 1. No acute cardiopulmonary abnormality. 2. Mild cardiomegaly with atypical cardiac configuration, likely congenital or postoperative, unchanged since prior study. Electronically signed by: Elsie Gravely MD 03/05/2024 12:12 AM EST RP Workstation: [id number]     Procedures   Medications Ordered in the ED - No data to display                                  Medical Decision Making Amount and/or Complexity of Data Reviewed Labs: ordered. Radiology: ordered.   Gradually progressive weakness in dialysis patient.  Differential diagnosis includes, but is not limited to, occult infection,  electrolyte disturbance, anemia, polymyalgia rheumatica.  Also, concurrent of symptoms with dry makes me wonder about possibility of an autoimmune process such as Sjogren syndrome.  I have reviewed her laboratory tests, and my interpretation is mild elevation of alkaline phosphatase which is likely secondary to renal osteodystrophy, elevated creatinine consistent with known end-stage renal disease, pancytopenia not significantly changed from baseline, normal sedimentation rate.  Normal sedimentation rate rules out polymyalgia rheumatica.  Cause for her weakness is not clear.  I am discharging her with instructions to follow-up with her primary care provider, return if symptoms are worsening.     Final diagnoses:  Weakness  Anemia associated with chronic renal failure  Thrombocytopenia  Elevated alkaline phosphatase level  Leukopenia, unspecified type    ED Discharge Orders     None  Raford Lenis, MD 03/05/24 [phone]  "

## 2024-03-04 NOTE — ED Triage Notes (Signed)
 Pt has multiple generalized complaints, but states that the reason she is here today is  increased weakness.

## 2024-03-04 NOTE — ED Notes (Signed)
 Attempted to get this patient blood work x2 unsuccessful

## 2024-03-05 DIAGNOSIS — D649 Anemia, unspecified: Secondary | ICD-10-CM | POA: Diagnosis not present

## 2024-03-05 LAB — CBC WITH DIFFERENTIAL/PLATELET
Abs Immature Granulocytes: 0 K/uL (ref 0.00–0.07)
Basophils Absolute: 0 K/uL (ref 0.0–0.1)
Basophils Relative: 1 %
Eosinophils Absolute: 0.2 K/uL (ref 0.0–0.5)
Eosinophils Relative: 6 %
HCT: 35.2 % — ABNORMAL LOW (ref 36.0–46.0)
Hemoglobin: 11.3 g/dL — ABNORMAL LOW (ref 12.0–15.0)
Immature Granulocytes: 0 %
Lymphocytes Relative: 34 %
Lymphs Abs: 1.1 K/uL (ref 0.7–4.0)
MCH: 31.1 pg (ref 26.0–34.0)
MCHC: 32.1 g/dL (ref 30.0–36.0)
MCV: 97 fL (ref 80.0–100.0)
Monocytes Absolute: 0.4 K/uL (ref 0.1–1.0)
Monocytes Relative: 12 %
Neutro Abs: 1.5 K/uL — ABNORMAL LOW (ref 1.7–7.7)
Neutrophils Relative %: 47 %
Platelets: 90 K/uL — ABNORMAL LOW (ref 150–400)
RBC: 3.63 MIL/uL — ABNORMAL LOW (ref 3.87–5.11)
RDW: 16.3 % — ABNORMAL HIGH (ref 11.5–15.5)
Smear Review: NORMAL
WBC: 3.1 K/uL — ABNORMAL LOW (ref 4.0–10.5)
nRBC: 0 % (ref 0.0–0.2)

## 2024-03-05 LAB — COMPREHENSIVE METABOLIC PANEL WITH GFR
ALT: 10 U/L (ref 0–44)
AST: 37 U/L (ref 15–41)
Albumin: 3.7 g/dL (ref 3.5–5.0)
Alkaline Phosphatase: 142 U/L — ABNORMAL HIGH (ref 38–126)
Anion gap: 10 (ref 5–15)
BUN: 7 mg/dL — ABNORMAL LOW (ref 8–23)
CO2: 28 mmol/L (ref 22–32)
Calcium: 10.5 mg/dL — ABNORMAL HIGH (ref 8.9–10.3)
Chloride: 100 mmol/L (ref 98–111)
Creatinine, Ser: 3.72 mg/dL — ABNORMAL HIGH (ref 0.44–1.00)
GFR, Estimated: 13 mL/min — ABNORMAL LOW
Glucose, Bld: 95 mg/dL (ref 70–99)
Potassium: 5 mmol/L (ref 3.5–5.1)
Sodium: 138 mmol/L (ref 135–145)
Total Bilirubin: 0.6 mg/dL (ref 0.0–1.2)
Total Protein: 7.2 g/dL (ref 6.5–8.1)

## 2024-03-05 LAB — SEDIMENTATION RATE: Sed Rate: 20 mm/h (ref 0–22)

## 2024-03-05 NOTE — Discharge Instructions (Signed)
 Your evaluation did not show the reason why you are feeling so weak.  Please make sure that you keep yourself adequately hydrated.  Please continue with your routine dialysis sessions.  Return to the emergency department if symptoms are getting worse.

## 2024-03-17 ENCOUNTER — Ambulatory Visit

## 2024-03-17 VITALS — BP 100/60 | HR 88 | Temp 98.2°F | Ht 59.0 in | Wt 135.0 lb

## 2024-03-17 DIAGNOSIS — G629 Polyneuropathy, unspecified: Secondary | ICD-10-CM

## 2024-03-17 DIAGNOSIS — T7840XA Allergy, unspecified, initial encounter: Secondary | ICD-10-CM

## 2024-03-17 DIAGNOSIS — Z8669 Personal history of other diseases of the nervous system and sense organs: Secondary | ICD-10-CM

## 2024-03-17 DIAGNOSIS — J301 Allergic rhinitis due to pollen: Secondary | ICD-10-CM

## 2024-03-17 MED ORDER — LORATADINE 5 MG PO TBDP: 1.0000 | ORAL_TABLET | Freq: Every day | ORAL | 1 refills | Status: AC

## 2024-03-17 NOTE — Assessment & Plan Note (Signed)
 I did refill her loratadine  5 mg once a day as needed on 03/17/2024. Orders:   Loratadine  5 MG TBDP; Take 1 tablet (5 mg total) by mouth daily.

## 2024-03-17 NOTE — Progress Notes (Signed)
 "  Acute Office Visit  Subjective:     Patient ID: [name] Marks, female    DOB: 1955-09-08, 68 y.o.   MRN: [id number]  Chief Complaint  Patient presents with   other    Patient presents today with her daughter. Patient's daughter reports the patient has been consistently rubbing her face and saying that she has liquid on her face that is coming down from her head, she reports she has been saying this for the past few months. She reports she has been going to the ER for this previously. She reports the liquid is burning her fingers and she Is consistently having to wash her hands. Patient reports she is feeling weaker.    This is a 69 year old African-American female who is here with her daughter today because of EYE DRAINAGE.  She has a  slimy bilateral eye drainage that is being going on for the past 3-4 months.  She has the need to rub her face every day and then wash her hands after that because this substance burned her hands.  She does have a history of DRY EYES and does see an ophthalmologist which she last saw on 01/14/2024 who prescribed her tobramycin eyedrops which she is only used a few times.  She does have a history of GLAUCOMA.  She states that this eye drainage is getting worse.  She also states she has a sensation of having wetness over the back of her neck.  She does have ENVIRONMENTAL ALLERGIES and was prescribed loratadine  5 mg once a day which she would like to have a refill on.  She has not used the loratadine  in a while.  She does have a clear runny nose.     Review of Systems  Eyes:  Positive for pain (last time eye-yesterday) and discharge. Negative for blurred vision and redness.  Respiratory:  Positive for cough.   Neurological:  Positive for weakness.  Endo/Heme/Allergies:        Always cold.  Clear runny nose        Objective:    Vitals:   03/17/24 1448  BP: 100/60  Pulse: 88  Temp: 98.2 F (36.8 C)  Height: 4' 11 (1.499 m)  Weight: 135 lb (61.2  kg)  TempSrc: Oral  BMI (Calculated): 27.25    BP Readings from Last 3 Encounters:  03/17/24 100/60  03/05/24 (!) 108/53  02/21/24 (!) 66/47   Wt Readings from Last 3 Encounters:  03/17/24 135 lb (61.2 kg)  03/04/24 126 lb 12.8 oz (57.5 kg)  02/21/24 134 lb (60.8 kg)      Physical Exam Constitutional:      Appearance: Normal appearance. She is normal weight.     Comments: She has gained 9 pounds in weight since 03/04/2024  HENT:     Head: Normocephalic and atraumatic.     Right Ear: Tympanic membrane, ear canal and external ear normal.     Left Ear: Tympanic membrane, ear canal and external ear normal.  Eyes:     Conjunctiva/sclera: Conjunctivae normal.     Pupils: Pupils are equal, round, and reactive to light.     Comments: The presence of bilaterally clear watery eyes right equals left  Cardiovascular:     Rate and Rhythm: Normal rate and regular rhythm.     Pulses: Normal pulses.     Heart sounds: Normal heart sounds.  Pulmonary:     Effort: Pulmonary effort is normal.     Breath sounds: Normal breath  sounds. No wheezing.  Abdominal:     General: Abdomen is flat.     Palpations: Abdomen is soft.     Tenderness: There is no abdominal tenderness.  Musculoskeletal:     Cervical back: Normal range of motion and neck supple.     Right lower leg: No edema.     Left lower leg: No edema.  Neurological:     Mental Status: She is alert.     No results found for any visits on 03/17/24.      Assessment & Plan:   Assessment & Plan Peripheral polyneuropathy She was referred neurologist on 03/17/24. Orders:   Ambulatory referral to Neurology  Seasonal allergic rhinitis due to pollen I did refill her loratadine  5 mg once a day as needed on 03/17/2024. Orders:   Loratadine  5 MG TBDP; Take 1 tablet (5 mg total) by mouth daily.  Allergy, initial encounter     History of glaucoma As of 03/17/2024 she does see an ophthalmologist     Return After neurologist.  [name], DO   "

## 2024-03-17 NOTE — Progress Notes (Signed)
 I,[name], CMA,acting as a neurosurgeon for [name] JONELLE Fischer, DO.,have documented all relevant documentation on the behalf of [name] JONELLE Fischer, DO,as directed by  [name] JONELLE Fischer, DO while in the presence of [name] JONELLE Fischer, DO.  Subjective:  Patient ID: [name] Marks , female    DOB: August 14, 1955 , 69 y.o.   MRN: [id number]  Chief Complaint  Patient presents with   other    Patient presents today with her daughter. Patient's daughter reports the patient has been consistently rubbing her face and saying that she has liquid on her face that is coming down from her head, she reports she has been saying this for the past few months. She reports she has been going to the ER for this previously. She reports the liquid is burning her fingers and she Is consistently having to wash her hands. Patient reports she is feeling weaker.    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   CVA (cerebral vascular accident) (HCC) 05/16/2016   Dialysis patient    End stage renal disease on dialysis (HCC)    MWF; [address] (01/03/2017)   Endocarditis    Esophageal reflux 12/06/2008   GERD (gastroesophageal reflux disease)    Gout    History of blood transfusion 2017   related to blood poison (01/03/2017)   Increased endometrial stripe thickness 11/03/2015   Neck pain    Osteoporosis 09/2016   T score -2.6   Pain and swelling of right upper extremity 12/20/2015   Pulmonary HTN (HCC) 07/12/2016   Renal dialysis  device, implant, or graft complication 12/20/2015   Renal insufficiency    S/P cholecystectomy 02/14/2017   Septic shock (HCC)    Staphylococcus aureus bacteremia    TIA (transient ischemic attack)    several (01/03/2017)     Family History  Problem Relation Age of Onset   Diabetes Mother    Hypertension Mother    Heart disease Mother    Alcohol abuse Mother    Kidney disease Mother    Cancer Father        COLON   Alcohol abuse Father    Breast cancer Sister 54   Hypertension Sister    Kidney disease Sister    Breast cancer Sister        breast cancer   Hypertension Son    Kidney disease Son     Current Medications[1]   Allergies[2]   Review of Systems   Today's Vitals   03/17/24 1448  Temp: 98.2 F (36.8 C)  TempSrc: Oral  Weight: 135 lb (61.2 kg)  Height: 4' 11 (1.499 m)   Body mass index is 27.27 kg/m.  Wt Readings from Last 3 Encounters:  03/17/24 135 lb (61.2 kg)  03/04/24 126 lb 12.8 oz (57.5 kg)  02/21/24 134 lb (60.8 kg)    The ASCVD Risk score (Arnett DK, et al., 2019) failed to calculate for the following reasons:  Risk score cannot be calculated because patient has a medical history suggesting prior/existing ASCVD   * - Cholesterol units were assumed  Objective:  Physical Exam      Assessment And Plan:   Assessment & Plan   No orders of the defined types were placed in this encounter.    No follow-ups on file.  Patient was given opportunity to ask questions. Patient verbalized understanding of the plan and was able to repeat key elements of the plan. All questions were answered to their satisfaction.    LILLETTE [name] JONELLE Bernardo, DO, have reviewed all documentation for this visit. The documentation on 03/17/24 for the exam, diagnosis, procedures, and orders are all accurate and complete.   IF YOU HAVE BEEN REFERRED TO A SPECIALIST, IT MAY TAKE 1-2 WEEKS TO SCHEDULE/PROCESS THE REFERRAL. IF YOU HAVE NOT HEARD FROM US /SPECIALIST IN TWO WEEKS,  PLEASE GIVE US  A CALL AT [phone].     [1]  Current Outpatient Medications:    cinacalcet  (SENSIPAR ) 90 MG tablet, Take 90 mg by mouth at bedtime., Disp: , Rfl:    clopidogrel  (PLAVIX ) 75 MG tablet, TAKE 1 TABLET BY MOUTH EVERY DAY, Disp: 15 tablet, Rfl: 0   famotidine  (PEPCID ) 20 MG tablet, Take 20 mg by mouth daily after supper., Disp: , Rfl:    fluticasone  (FLONASE ) 50 MCG/ACT nasal spray, Place 2 sprays into both nostrils daily., Disp: 9.9 mL, Rfl: 2   HYDROcodone -acetaminophen  (NORCO/VICODIN) 5-325 MG tablet, Take 1 tablet by mouth every 8 (eight) hours as needed for moderate pain (pain score 4-6)., Disp: 20 tablet, Rfl: 0   hydrocortisone  cream 1 %, Apply to affected area 2 times daily, Disp: 15 g, Rfl: 0   lidocaine  (LIDODERM ) 5 %, Place 1 patch onto the skin daily. Remove & Discard patch within 12 hours or as directed by MD, Disp: 30 patch, Rfl: 0   Loratadine  5 MG TBDP, Take 1 tablet (5 mg total) by mouth daily., Disp: 90 tablet, Rfl: 0   methocarbamol  (ROBAXIN ) 500 MG tablet, Take 1-2 tablets (500-1,000 mg total) by mouth every 8 (eight) hours as needed for muscle spasms., Disp: 20 tablet, Rfl: 0   midodrine  (PROAMATINE ) 10 MG tablet, Take 10 mg by mouth See admin instructions. Take 1 tablet (10 mg) by mouth before dialysis & take 1 tablet (10 mg) while at dialysis on Mondays, Wednesdays & Fridays., Disp: , Rfl:    sevelamer  carbonate (RENVELA ) 800 MG tablet, Take 2,400 mg by mouth See admin instructions. Take 3 tablets (2400 mg) by mouth after each meal and after each snack, Disp: , Rfl:    diclofenac  Sodium (VOLTAREN ) 1 % GEL, Apply 2 g topically 4 (four) times daily as needed. (Patient not taking: Reported on 03/17/2024), Disp: 100 g, Rfl: 0   tobramycin-dexamethasone  (TOBRADEX) ophthalmic solution, Place 1 drop into both eyes 2 (two) times daily. (Patient not taking: Reported on 03/17/2024), Disp: , Rfl:  [2]  Allergies Allergen Reactions   Iodinated Contrast Media  Anaphylaxis and Hives   Cefazolin  Nausea And Vomiting   Diphenhydramine  Other (See Comments)   Naproxen  Sodium Itching

## 2024-03-26 ENCOUNTER — Ambulatory Visit: Payer: Self-pay

## 2024-04-09 ENCOUNTER — Ambulatory Visit: Admitting: Family Medicine

## 2024-08-04 ENCOUNTER — Ambulatory Visit: Admitting: Neurology
